# Patient Record
Sex: Female | Born: 1955 | Race: Black or African American | Hispanic: No | Marital: Single | Smoking: Former smoker
Health system: Southern US, Community
[De-identification: ages and names within clinical notes are randomized; demographics above are authoritative.]

## PROBLEM LIST (undated history)

## (undated) DIAGNOSIS — J930 Spontaneous tension pneumothorax: Secondary | ICD-10-CM

## (undated) DIAGNOSIS — I319 Disease of pericardium, unspecified: Secondary | ICD-10-CM

## (undated) DIAGNOSIS — E119 Type 2 diabetes mellitus without complications: Secondary | ICD-10-CM

## (undated) DIAGNOSIS — D649 Anemia, unspecified: Secondary | ICD-10-CM

## (undated) DIAGNOSIS — IMO0001 Reserved for inherently not codable concepts without codable children: Secondary | ICD-10-CM

## (undated) DIAGNOSIS — M1990 Unspecified osteoarthritis, unspecified site: Secondary | ICD-10-CM

## (undated) DIAGNOSIS — Z9889 Other specified postprocedural states: Secondary | ICD-10-CM

## (undated) DIAGNOSIS — R030 Elevated blood-pressure reading, without diagnosis of hypertension: Secondary | ICD-10-CM

## (undated) DIAGNOSIS — I351 Nonrheumatic aortic (valve) insufficiency: Secondary | ICD-10-CM

## (undated) DIAGNOSIS — R112 Nausea with vomiting, unspecified: Secondary | ICD-10-CM

## (undated) DIAGNOSIS — K5792 Diverticulitis of intestine, part unspecified, without perforation or abscess without bleeding: Secondary | ICD-10-CM

## (undated) DIAGNOSIS — I4891 Unspecified atrial fibrillation: Secondary | ICD-10-CM

## (undated) DIAGNOSIS — I3139 Other pericardial effusion (noninflammatory): Secondary | ICD-10-CM

## (undated) DIAGNOSIS — C3400 Malignant neoplasm of unspecified main bronchus: Secondary | ICD-10-CM

## (undated) DIAGNOSIS — J8410 Pulmonary fibrosis, unspecified: Secondary | ICD-10-CM

## (undated) DIAGNOSIS — Z87442 Personal history of urinary calculi: Secondary | ICD-10-CM

## (undated) DIAGNOSIS — L52 Erythema nodosum: Secondary | ICD-10-CM

## (undated) DIAGNOSIS — J984 Other disorders of lung: Secondary | ICD-10-CM

## (undated) DIAGNOSIS — E669 Obesity, unspecified: Secondary | ICD-10-CM

## (undated) DIAGNOSIS — C801 Malignant (primary) neoplasm, unspecified: Secondary | ICD-10-CM

## (undated) DIAGNOSIS — I10 Essential (primary) hypertension: Secondary | ICD-10-CM

## (undated) DIAGNOSIS — I2510 Atherosclerotic heart disease of native coronary artery without angina pectoris: Secondary | ICD-10-CM

## (undated) DIAGNOSIS — I313 Pericardial effusion (noninflammatory): Secondary | ICD-10-CM

## (undated) DIAGNOSIS — R011 Cardiac murmur, unspecified: Secondary | ICD-10-CM

## (undated) DIAGNOSIS — G4700 Insomnia, unspecified: Secondary | ICD-10-CM

## (undated) DIAGNOSIS — T4145XA Adverse effect of unspecified anesthetic, initial encounter: Secondary | ICD-10-CM

## (undated) DIAGNOSIS — T8859XA Other complications of anesthesia, initial encounter: Secondary | ICD-10-CM

## (undated) DIAGNOSIS — T7840XA Allergy, unspecified, initial encounter: Secondary | ICD-10-CM

## (undated) DIAGNOSIS — N200 Calculus of kidney: Secondary | ICD-10-CM

## (undated) DIAGNOSIS — M791 Myalgia, unspecified site: Secondary | ICD-10-CM

## (undated) HISTORY — DX: Disease of pericardium, unspecified: I31.9

## (undated) HISTORY — DX: Insomnia, unspecified: G47.00

## (undated) HISTORY — DX: Cardiac murmur, unspecified: R01.1

## (undated) HISTORY — PX: APPENDECTOMY: SHX54

## (undated) HISTORY — PX: CORONARY ANGIOPLASTY: SHX604

## (undated) HISTORY — PX: SHOULDER ARTHROSCOPY W/ ROTATOR CUFF REPAIR: SHX2400

## (undated) HISTORY — PX: COLONOSCOPY: SHX174

## (undated) HISTORY — DX: Other pericardial effusion (noninflammatory): I31.39

## (undated) HISTORY — DX: Spontaneous tension pneumothorax: J93.0

## (undated) HISTORY — DX: Myalgia, unspecified site: M79.10

## (undated) HISTORY — DX: Erythema nodosum: L52

## (undated) HISTORY — PX: SHOULDER ADHESION RELEASE: SHX773

## (undated) HISTORY — DX: Obesity, unspecified: E66.9

## (undated) HISTORY — DX: Nonrheumatic aortic (valve) insufficiency: I35.1

## (undated) HISTORY — DX: Unspecified atrial fibrillation: I48.91

## (undated) HISTORY — DX: Reserved for inherently not codable concepts without codable children: IMO0001

## (undated) HISTORY — PX: INNER EAR SURGERY: SHX679

## (undated) HISTORY — DX: Calculus of kidney: N20.0

## (undated) HISTORY — PX: KNEE ARTHROSCOPY: SHX127

## (undated) HISTORY — DX: Elevated blood-pressure reading, without diagnosis of hypertension: R03.0

## (undated) HISTORY — DX: Type 2 diabetes mellitus without complications: E11.9

## (undated) HISTORY — DX: Diverticulitis of intestine, part unspecified, without perforation or abscess without bleeding: K57.92

## (undated) HISTORY — PX: CARPAL TUNNEL RELEASE: SHX101

## (undated) HISTORY — DX: Anemia, unspecified: D64.9

## (undated) HISTORY — DX: Malignant neoplasm of unspecified main bronchus: C34.00

## (undated) HISTORY — DX: Pulmonary fibrosis, unspecified: J84.10

## (undated) HISTORY — DX: Allergy, unspecified, initial encounter: T78.40XA

## (undated) HISTORY — DX: Pericardial effusion (noninflammatory): I31.3

---

## 1979-06-22 HISTORY — PX: FOOT SURGERY: SHX648

## 1989-10-21 HISTORY — PX: KNEE ARTHROPLASTY: SHX992

## 1999-08-01 ENCOUNTER — Other Ambulatory Visit: Payer: Self-pay | Admitting: Pulmonary Disease

## 1999-09-12 ENCOUNTER — Ambulatory Visit (HOSPITAL_COMMUNITY): Admission: RE | Admit: 1999-09-12 | Discharge: 1999-09-12 | Payer: Self-pay | Admitting: *Deleted

## 1999-09-12 ENCOUNTER — Encounter: Payer: Self-pay | Admitting: *Deleted

## 1999-10-22 HISTORY — PX: LUNG CANCER SURGERY: SHX702

## 1999-10-24 ENCOUNTER — Encounter: Payer: Self-pay | Admitting: Thoracic Surgery

## 1999-10-25 ENCOUNTER — Encounter (INDEPENDENT_AMBULATORY_CARE_PROVIDER_SITE_OTHER): Payer: Self-pay | Admitting: *Deleted

## 1999-10-25 ENCOUNTER — Inpatient Hospital Stay (HOSPITAL_COMMUNITY): Admission: RE | Admit: 1999-10-25 | Discharge: 1999-11-01 | Payer: Self-pay | Admitting: Thoracic Surgery

## 1999-10-25 ENCOUNTER — Encounter: Payer: Self-pay | Admitting: Thoracic Surgery

## 1999-10-26 ENCOUNTER — Encounter: Payer: Self-pay | Admitting: Thoracic Surgery

## 1999-10-27 ENCOUNTER — Encounter: Payer: Self-pay | Admitting: Thoracic Surgery

## 1999-10-28 ENCOUNTER — Encounter: Payer: Self-pay | Admitting: Thoracic Surgery

## 1999-10-29 ENCOUNTER — Encounter: Payer: Self-pay | Admitting: Thoracic Surgery

## 1999-10-31 ENCOUNTER — Encounter: Payer: Self-pay | Admitting: Thoracic Surgery

## 1999-11-08 ENCOUNTER — Encounter: Payer: Self-pay | Admitting: Thoracic Surgery

## 1999-11-08 ENCOUNTER — Encounter: Admission: RE | Admit: 1999-11-08 | Discharge: 1999-11-08 | Payer: Self-pay | Admitting: Thoracic Surgery

## 1999-11-26 ENCOUNTER — Encounter: Admission: RE | Admit: 1999-11-26 | Discharge: 2000-02-24 | Payer: Self-pay | Admitting: Radiation Oncology

## 2000-01-08 ENCOUNTER — Encounter: Admission: RE | Admit: 2000-01-08 | Discharge: 2000-01-08 | Payer: Self-pay | Admitting: Oncology

## 2000-01-08 ENCOUNTER — Encounter: Payer: Self-pay | Admitting: Oncology

## 2000-01-30 ENCOUNTER — Encounter: Payer: Self-pay | Admitting: Thoracic Surgery

## 2000-01-30 ENCOUNTER — Encounter: Admission: RE | Admit: 2000-01-30 | Discharge: 2000-01-30 | Payer: Self-pay | Admitting: Thoracic Surgery

## 2000-05-07 ENCOUNTER — Encounter: Payer: Self-pay | Admitting: Thoracic Surgery

## 2000-05-07 ENCOUNTER — Encounter: Admission: RE | Admit: 2000-05-07 | Discharge: 2000-05-07 | Payer: Self-pay | Admitting: Thoracic Surgery

## 2000-06-02 ENCOUNTER — Encounter: Payer: Self-pay | Admitting: Oncology

## 2000-06-02 ENCOUNTER — Encounter: Admission: RE | Admit: 2000-06-02 | Discharge: 2000-06-02 | Payer: Self-pay | Admitting: Oncology

## 2000-09-02 ENCOUNTER — Encounter: Admission: RE | Admit: 2000-09-02 | Discharge: 2000-09-02 | Payer: Self-pay | Admitting: Oncology

## 2000-09-02 ENCOUNTER — Encounter: Payer: Self-pay | Admitting: Oncology

## 2000-09-24 ENCOUNTER — Encounter: Payer: Self-pay | Admitting: Family Medicine

## 2000-09-24 ENCOUNTER — Ambulatory Visit (HOSPITAL_COMMUNITY): Admission: RE | Admit: 2000-09-24 | Discharge: 2000-09-24 | Payer: Self-pay | Admitting: Family Medicine

## 2000-10-21 LAB — HM PAP SMEAR

## 2000-10-21 LAB — HM MAMMOGRAPHY

## 2000-10-23 ENCOUNTER — Encounter: Payer: Self-pay | Admitting: Thoracic Surgery

## 2000-10-23 ENCOUNTER — Encounter: Admission: RE | Admit: 2000-10-23 | Discharge: 2000-10-23 | Payer: Self-pay | Admitting: Thoracic Surgery

## 2000-11-17 ENCOUNTER — Emergency Department (HOSPITAL_COMMUNITY): Admission: EM | Admit: 2000-11-17 | Discharge: 2000-11-17 | Payer: Self-pay | Admitting: Emergency Medicine

## 2000-11-17 ENCOUNTER — Encounter: Payer: Self-pay | Admitting: Emergency Medicine

## 2001-01-06 ENCOUNTER — Encounter: Payer: Self-pay | Admitting: *Deleted

## 2001-01-06 ENCOUNTER — Ambulatory Visit (HOSPITAL_COMMUNITY): Admission: RE | Admit: 2001-01-06 | Discharge: 2001-01-06 | Payer: Self-pay | Admitting: *Deleted

## 2001-01-06 ENCOUNTER — Encounter (INDEPENDENT_AMBULATORY_CARE_PROVIDER_SITE_OTHER): Payer: Self-pay

## 2001-01-21 ENCOUNTER — Encounter: Admission: RE | Admit: 2001-01-21 | Discharge: 2001-01-21 | Payer: Self-pay | Admitting: Thoracic Surgery

## 2001-01-21 ENCOUNTER — Encounter: Payer: Self-pay | Admitting: Thoracic Surgery

## 2001-03-02 ENCOUNTER — Ambulatory Visit (HOSPITAL_COMMUNITY): Admission: RE | Admit: 2001-03-02 | Discharge: 2001-03-02 | Payer: Self-pay | Admitting: Oncology

## 2001-03-02 ENCOUNTER — Encounter: Payer: Self-pay | Admitting: Oncology

## 2001-05-20 ENCOUNTER — Encounter: Admission: RE | Admit: 2001-05-20 | Discharge: 2001-05-20 | Payer: Self-pay | Admitting: Thoracic Surgery

## 2001-05-20 ENCOUNTER — Encounter: Payer: Self-pay | Admitting: Thoracic Surgery

## 2001-05-25 ENCOUNTER — Encounter: Payer: Self-pay | Admitting: Oncology

## 2001-05-25 ENCOUNTER — Encounter: Admission: RE | Admit: 2001-05-25 | Discharge: 2001-05-25 | Payer: Self-pay | Admitting: Oncology

## 2001-08-19 ENCOUNTER — Encounter: Payer: Self-pay | Admitting: Thoracic Surgery

## 2001-08-19 ENCOUNTER — Encounter: Admission: RE | Admit: 2001-08-19 | Discharge: 2001-08-19 | Payer: Self-pay | Admitting: Thoracic Surgery

## 2001-08-27 ENCOUNTER — Ambulatory Visit (HOSPITAL_COMMUNITY): Admission: RE | Admit: 2001-08-27 | Discharge: 2001-08-27 | Payer: Self-pay | Admitting: *Deleted

## 2001-08-27 ENCOUNTER — Encounter: Payer: Self-pay | Admitting: *Deleted

## 2001-09-28 ENCOUNTER — Encounter: Admission: RE | Admit: 2001-09-28 | Discharge: 2001-09-28 | Payer: Self-pay | Admitting: Oncology

## 2001-09-28 ENCOUNTER — Encounter: Payer: Self-pay | Admitting: Oncology

## 2001-10-12 ENCOUNTER — Ambulatory Visit (HOSPITAL_COMMUNITY): Admission: RE | Admit: 2001-10-12 | Discharge: 2001-10-12 | Payer: Self-pay | Admitting: *Deleted

## 2001-10-12 ENCOUNTER — Encounter: Payer: Self-pay | Admitting: *Deleted

## 2001-11-04 ENCOUNTER — Encounter: Payer: Self-pay | Admitting: Thoracic Surgery

## 2001-11-04 ENCOUNTER — Encounter: Admission: RE | Admit: 2001-11-04 | Discharge: 2001-11-04 | Payer: Self-pay | Admitting: Thoracic Surgery

## 2002-01-27 ENCOUNTER — Encounter: Admission: RE | Admit: 2002-01-27 | Discharge: 2002-01-27 | Payer: Self-pay | Admitting: Oncology

## 2002-01-27 ENCOUNTER — Encounter: Payer: Self-pay | Admitting: Oncology

## 2002-02-01 ENCOUNTER — Ambulatory Visit (HOSPITAL_COMMUNITY): Admission: RE | Admit: 2002-02-01 | Discharge: 2002-02-01 | Payer: Self-pay | Admitting: Oncology

## 2002-02-01 ENCOUNTER — Encounter: Payer: Self-pay | Admitting: Oncology

## 2002-05-12 ENCOUNTER — Encounter: Admission: RE | Admit: 2002-05-12 | Discharge: 2002-05-12 | Payer: Self-pay | Admitting: Thoracic Surgery

## 2002-05-12 ENCOUNTER — Encounter: Payer: Self-pay | Admitting: Thoracic Surgery

## 2002-05-17 ENCOUNTER — Encounter: Payer: Self-pay | Admitting: Oncology

## 2002-05-17 ENCOUNTER — Ambulatory Visit (HOSPITAL_COMMUNITY): Admission: RE | Admit: 2002-05-17 | Discharge: 2002-05-17 | Payer: Self-pay | Admitting: Oncology

## 2002-06-01 ENCOUNTER — Encounter: Payer: Self-pay | Admitting: Oncology

## 2002-06-01 ENCOUNTER — Encounter: Admission: RE | Admit: 2002-06-01 | Discharge: 2002-06-01 | Payer: Self-pay | Admitting: Oncology

## 2002-09-27 ENCOUNTER — Encounter: Payer: Self-pay | Admitting: Oncology

## 2002-09-27 ENCOUNTER — Ambulatory Visit (HOSPITAL_COMMUNITY): Admission: RE | Admit: 2002-09-27 | Discharge: 2002-09-27 | Payer: Self-pay | Admitting: Oncology

## 2002-10-08 ENCOUNTER — Ambulatory Visit (HOSPITAL_BASED_OUTPATIENT_CLINIC_OR_DEPARTMENT_OTHER): Admission: RE | Admit: 2002-10-08 | Discharge: 2002-10-08 | Payer: Self-pay | Admitting: Orthopedic Surgery

## 2002-11-04 ENCOUNTER — Encounter: Admission: RE | Admit: 2002-11-04 | Discharge: 2003-02-02 | Payer: Self-pay

## 2002-12-13 ENCOUNTER — Other Ambulatory Visit: Admission: RE | Admit: 2002-12-13 | Discharge: 2002-12-13 | Payer: Self-pay | Admitting: Endocrinology

## 2002-12-15 ENCOUNTER — Ambulatory Visit (HOSPITAL_BASED_OUTPATIENT_CLINIC_OR_DEPARTMENT_OTHER): Admission: RE | Admit: 2002-12-15 | Discharge: 2002-12-16 | Payer: Self-pay | Admitting: Orthopedic Surgery

## 2002-12-31 ENCOUNTER — Encounter: Payer: Self-pay | Admitting: Cardiology

## 2003-01-24 ENCOUNTER — Encounter: Payer: Self-pay | Admitting: Oncology

## 2003-01-24 ENCOUNTER — Ambulatory Visit (HOSPITAL_COMMUNITY): Admission: RE | Admit: 2003-01-24 | Discharge: 2003-01-24 | Payer: Self-pay | Admitting: Oncology

## 2003-02-01 ENCOUNTER — Encounter: Admission: RE | Admit: 2003-02-01 | Discharge: 2003-05-02 | Payer: Self-pay

## 2003-05-26 ENCOUNTER — Ambulatory Visit (HOSPITAL_COMMUNITY): Admission: RE | Admit: 2003-05-26 | Discharge: 2003-05-26 | Payer: Self-pay | Admitting: Oncology

## 2003-05-26 ENCOUNTER — Encounter: Payer: Self-pay | Admitting: Oncology

## 2003-06-21 ENCOUNTER — Encounter: Payer: Self-pay | Admitting: Oncology

## 2003-06-21 ENCOUNTER — Encounter: Admission: RE | Admit: 2003-06-21 | Discharge: 2003-06-21 | Payer: Self-pay | Admitting: Oncology

## 2003-07-20 ENCOUNTER — Ambulatory Visit (HOSPITAL_BASED_OUTPATIENT_CLINIC_OR_DEPARTMENT_OTHER): Admission: RE | Admit: 2003-07-20 | Discharge: 2003-07-20 | Payer: Self-pay | Admitting: Orthopedic Surgery

## 2003-07-20 ENCOUNTER — Encounter (INDEPENDENT_AMBULATORY_CARE_PROVIDER_SITE_OTHER): Payer: Self-pay | Admitting: Specialist

## 2003-07-20 ENCOUNTER — Ambulatory Visit (HOSPITAL_COMMUNITY): Admission: RE | Admit: 2003-07-20 | Discharge: 2003-07-20 | Payer: Self-pay | Admitting: Orthopedic Surgery

## 2003-09-19 ENCOUNTER — Other Ambulatory Visit: Admission: RE | Admit: 2003-09-19 | Discharge: 2003-09-19 | Payer: Self-pay | Admitting: Obstetrics & Gynecology

## 2003-10-26 ENCOUNTER — Ambulatory Visit (HOSPITAL_COMMUNITY): Admission: RE | Admit: 2003-10-26 | Discharge: 2003-10-26 | Payer: Self-pay | Admitting: Oncology

## 2004-01-24 ENCOUNTER — Encounter: Admission: RE | Admit: 2004-01-24 | Discharge: 2004-04-23 | Payer: Self-pay | Admitting: Occupational Medicine

## 2004-03-17 ENCOUNTER — Inpatient Hospital Stay (HOSPITAL_COMMUNITY): Admission: EM | Admit: 2004-03-17 | Discharge: 2004-03-19 | Payer: Self-pay | Admitting: Emergency Medicine

## 2004-04-24 ENCOUNTER — Ambulatory Visit (HOSPITAL_COMMUNITY): Admission: RE | Admit: 2004-04-24 | Discharge: 2004-04-24 | Payer: Self-pay | Admitting: Oncology

## 2004-06-21 ENCOUNTER — Encounter: Admission: RE | Admit: 2004-06-21 | Discharge: 2004-06-21 | Payer: Self-pay | Admitting: Oncology

## 2004-07-16 ENCOUNTER — Ambulatory Visit (HOSPITAL_BASED_OUTPATIENT_CLINIC_OR_DEPARTMENT_OTHER): Admission: RE | Admit: 2004-07-16 | Discharge: 2004-07-16 | Payer: Self-pay | Admitting: Orthopedic Surgery

## 2004-07-16 ENCOUNTER — Ambulatory Visit (HOSPITAL_COMMUNITY): Admission: RE | Admit: 2004-07-16 | Discharge: 2004-07-16 | Payer: Self-pay | Admitting: Orthopedic Surgery

## 2004-10-10 ENCOUNTER — Ambulatory Visit: Payer: Self-pay | Admitting: Endocrinology

## 2004-10-23 ENCOUNTER — Ambulatory Visit (HOSPITAL_COMMUNITY): Admission: RE | Admit: 2004-10-23 | Discharge: 2004-10-23 | Payer: Self-pay | Admitting: Oncology

## 2004-10-23 ENCOUNTER — Ambulatory Visit: Payer: Self-pay | Admitting: Oncology

## 2004-10-26 ENCOUNTER — Encounter: Admission: RE | Admit: 2004-10-26 | Discharge: 2004-10-26 | Payer: Self-pay | Admitting: Occupational Medicine

## 2005-02-11 ENCOUNTER — Other Ambulatory Visit: Admission: RE | Admit: 2005-02-11 | Discharge: 2005-02-11 | Payer: Self-pay | Admitting: *Deleted

## 2005-03-04 ENCOUNTER — Emergency Department (HOSPITAL_COMMUNITY): Admission: EM | Admit: 2005-03-04 | Discharge: 2005-03-04 | Payer: Self-pay | Admitting: Emergency Medicine

## 2005-04-02 ENCOUNTER — Ambulatory Visit (HOSPITAL_BASED_OUTPATIENT_CLINIC_OR_DEPARTMENT_OTHER): Admission: RE | Admit: 2005-04-02 | Discharge: 2005-04-02 | Payer: Self-pay | Admitting: Urology

## 2005-04-02 ENCOUNTER — Ambulatory Visit (HOSPITAL_COMMUNITY): Admission: RE | Admit: 2005-04-02 | Discharge: 2005-04-02 | Payer: Self-pay | Admitting: Urology

## 2005-04-29 ENCOUNTER — Ambulatory Visit: Payer: Self-pay | Admitting: Oncology

## 2005-05-02 ENCOUNTER — Ambulatory Visit (HOSPITAL_COMMUNITY): Admission: RE | Admit: 2005-05-02 | Discharge: 2005-05-02 | Payer: Self-pay | Admitting: Oncology

## 2005-07-30 ENCOUNTER — Encounter: Admission: RE | Admit: 2005-07-30 | Discharge: 2005-07-30 | Payer: Self-pay | Admitting: Oncology

## 2005-10-04 ENCOUNTER — Encounter: Admission: RE | Admit: 2005-10-04 | Discharge: 2005-10-04 | Payer: Self-pay | Admitting: Otolaryngology

## 2005-10-21 DIAGNOSIS — J9383 Other pneumothorax: Secondary | ICD-10-CM | POA: Insufficient documentation

## 2005-10-21 HISTORY — PX: LUNG SURGERY: SHX703

## 2005-10-21 HISTORY — PX: PULMONARY EMBOLISM SURGERY: SHX752

## 2005-11-07 ENCOUNTER — Ambulatory Visit: Payer: Self-pay | Admitting: Oncology

## 2005-11-08 ENCOUNTER — Ambulatory Visit (HOSPITAL_COMMUNITY): Admission: RE | Admit: 2005-11-08 | Discharge: 2005-11-08 | Payer: Self-pay | Admitting: Oncology

## 2005-12-02 ENCOUNTER — Ambulatory Visit (HOSPITAL_COMMUNITY): Admission: RE | Admit: 2005-12-02 | Discharge: 2005-12-02 | Payer: Self-pay | Admitting: *Deleted

## 2006-02-11 ENCOUNTER — Other Ambulatory Visit: Admission: RE | Admit: 2006-02-11 | Discharge: 2006-02-11 | Payer: Self-pay | Admitting: *Deleted

## 2006-05-06 ENCOUNTER — Ambulatory Visit: Payer: Self-pay | Admitting: Oncology

## 2006-05-09 ENCOUNTER — Ambulatory Visit (HOSPITAL_COMMUNITY): Admission: RE | Admit: 2006-05-09 | Discharge: 2006-05-09 | Payer: Self-pay | Admitting: Oncology

## 2006-05-09 LAB — CBC WITH DIFFERENTIAL/PLATELET
BASO%: 0.6 % (ref 0.0–2.0)
Basophils Absolute: 0 10*3/uL (ref 0.0–0.1)
EOS%: 3.5 % (ref 0.0–7.0)
Eosinophils Absolute: 0.2 10*3/uL (ref 0.0–0.5)
HCT: 36.9 % (ref 34.8–46.6)
HGB: 12.4 g/dL (ref 11.6–15.9)
LYMPH%: 24.9 % (ref 14.0–48.0)
MCH: 31.1 pg (ref 26.0–34.0)
MCHC: 33.5 g/dL (ref 32.0–36.0)
MCV: 92.7 fL (ref 81.0–101.0)
MONO#: 0.4 10*3/uL (ref 0.1–0.9)
MONO%: 7.5 % (ref 0.0–13.0)
NEUT#: 3.6 10*3/uL (ref 1.5–6.5)
NEUT%: 63.5 % (ref 39.6–76.8)
Platelets: 342 10*3/uL (ref 145–400)
RBC: 3.98 10*6/uL (ref 3.70–5.32)
RDW: 13.2 % (ref 11.3–14.5)
WBC: 5.7 10*3/uL (ref 3.9–10.0)
lymph#: 1.4 10*3/uL (ref 0.9–3.3)

## 2006-05-09 LAB — COMPREHENSIVE METABOLIC PANEL
ALT: 12 U/L (ref 0–40)
AST: 12 U/L (ref 0–37)
Albumin: 3.9 g/dL (ref 3.5–5.2)
Alkaline Phosphatase: 93 U/L (ref 39–117)
BUN: 15 mg/dL (ref 6–23)
CO2: 24 mEq/L (ref 19–32)
Calcium: 9.2 mg/dL (ref 8.4–10.5)
Chloride: 105 mEq/L (ref 96–112)
Creatinine, Ser: 0.79 mg/dL (ref 0.40–1.20)
Glucose, Bld: 98 mg/dL (ref 70–99)
Potassium: 4 mEq/L (ref 3.5–5.3)
Sodium: 138 mEq/L (ref 135–145)
Total Bilirubin: 0.4 mg/dL (ref 0.3–1.2)
Total Protein: 6.9 g/dL (ref 6.0–8.3)

## 2006-05-09 LAB — LACTATE DEHYDROGENASE: LDH: 154 U/L (ref 94–250)

## 2006-07-31 ENCOUNTER — Encounter: Admission: RE | Admit: 2006-07-31 | Discharge: 2006-07-31 | Payer: Self-pay | Admitting: Oncology

## 2006-08-11 ENCOUNTER — Inpatient Hospital Stay (HOSPITAL_COMMUNITY): Admission: EM | Admit: 2006-08-11 | Discharge: 2006-08-24 | Payer: Self-pay | Admitting: Emergency Medicine

## 2006-08-18 ENCOUNTER — Encounter (INDEPENDENT_AMBULATORY_CARE_PROVIDER_SITE_OTHER): Payer: Self-pay | Admitting: Specialist

## 2006-08-18 ENCOUNTER — Encounter: Payer: Self-pay | Admitting: Thoracic Surgery (Cardiothoracic Vascular Surgery)

## 2006-09-04 ENCOUNTER — Encounter
Admission: RE | Admit: 2006-09-04 | Discharge: 2006-09-04 | Payer: Self-pay | Admitting: Thoracic Surgery (Cardiothoracic Vascular Surgery)

## 2006-10-21 HISTORY — PX: ABDOMINAL HYSTERECTOMY: SHX81

## 2006-11-20 ENCOUNTER — Ambulatory Visit: Payer: Self-pay | Admitting: Oncology

## 2006-11-26 ENCOUNTER — Ambulatory Visit (HOSPITAL_COMMUNITY): Admission: RE | Admit: 2006-11-26 | Discharge: 2006-11-26 | Payer: Self-pay | Admitting: Oncology

## 2006-11-26 LAB — CBC WITH DIFFERENTIAL/PLATELET
BASO%: 1.3 % (ref 0.0–2.0)
Basophils Absolute: 0.1 10*3/uL (ref 0.0–0.1)
EOS%: 7 % (ref 0.0–7.0)
Eosinophils Absolute: 0.3 10*3/uL (ref 0.0–0.5)
HCT: 35.2 % (ref 34.8–46.6)
HGB: 11.8 g/dL (ref 11.6–15.9)
LYMPH%: 26.2 % (ref 14.0–48.0)
MCH: 30.7 pg (ref 26.0–34.0)
MCHC: 33.4 g/dL (ref 32.0–36.0)
MCV: 91.8 fL (ref 81.0–101.0)
MONO#: 0.3 10*3/uL (ref 0.1–0.9)
MONO%: 7.5 % (ref 0.0–13.0)
NEUT#: 2.6 10*3/uL (ref 1.5–6.5)
NEUT%: 58.1 % (ref 39.6–76.8)
Platelets: 285 10*3/uL (ref 145–400)
RBC: 3.83 10*6/uL (ref 3.70–5.32)
RDW: 11.2 % — ABNORMAL LOW (ref 11.3–14.5)
WBC: 4.5 10*3/uL (ref 3.9–10.0)
lymph#: 1.2 10*3/uL (ref 0.9–3.3)

## 2006-11-26 LAB — COMPREHENSIVE METABOLIC PANEL
ALT: 14 U/L (ref 0–35)
AST: 14 U/L (ref 0–37)
Albumin: 4 g/dL (ref 3.5–5.2)
Alkaline Phosphatase: 111 U/L (ref 39–117)
BUN: 16 mg/dL (ref 6–23)
CO2: 19 mEq/L (ref 19–32)
Calcium: 8.8 mg/dL (ref 8.4–10.5)
Chloride: 105 mEq/L (ref 96–112)
Creatinine, Ser: 0.7 mg/dL (ref 0.40–1.20)
Glucose, Bld: 139 mg/dL — ABNORMAL HIGH (ref 70–99)
Potassium: 3.8 mEq/L (ref 3.5–5.3)
Sodium: 141 mEq/L (ref 135–145)
Total Bilirubin: 0.3 mg/dL (ref 0.3–1.2)
Total Protein: 7 g/dL (ref 6.0–8.3)

## 2006-11-26 LAB — LACTATE DEHYDROGENASE: LDH: 211 U/L (ref 94–250)

## 2006-12-11 ENCOUNTER — Encounter: Payer: Self-pay | Admitting: Pulmonary Disease

## 2006-12-11 ENCOUNTER — Ambulatory Visit: Admission: RE | Admit: 2006-12-11 | Discharge: 2006-12-11 | Payer: Self-pay | Admitting: Oncology

## 2007-01-14 ENCOUNTER — Ambulatory Visit: Payer: Self-pay | Admitting: Oncology

## 2007-02-03 ENCOUNTER — Other Ambulatory Visit: Admission: RE | Admit: 2007-02-03 | Discharge: 2007-02-03 | Payer: Self-pay | Admitting: *Deleted

## 2007-05-22 ENCOUNTER — Ambulatory Visit: Payer: Self-pay | Admitting: Oncology

## 2007-05-26 ENCOUNTER — Ambulatory Visit (HOSPITAL_COMMUNITY): Admission: RE | Admit: 2007-05-26 | Discharge: 2007-05-26 | Payer: Self-pay | Admitting: Oncology

## 2007-05-26 LAB — COMPREHENSIVE METABOLIC PANEL
ALT: 15 U/L (ref 0–35)
AST: 19 U/L (ref 0–37)
Albumin: 3.4 g/dL — ABNORMAL LOW (ref 3.5–5.2)
Alkaline Phosphatase: 95 U/L (ref 39–117)
BUN: 16 mg/dL (ref 6–23)
CO2: 29 mEq/L (ref 19–32)
Calcium: 9.1 mg/dL (ref 8.4–10.5)
Chloride: 109 mEq/L (ref 96–112)
Creatinine, Ser: <0.3 mg/dL — ABNORMAL LOW (ref 0.40–1.20)
Glucose, Bld: 160 mg/dL — ABNORMAL HIGH (ref 70–99)
Potassium: 4.3 mEq/L (ref 3.5–5.3)
Sodium: 142 mEq/L (ref 135–145)
Total Bilirubin: 0.4 mg/dL (ref 0.3–1.2)
Total Protein: 6.9 g/dL (ref 6.0–8.3)

## 2007-05-26 LAB — CBC WITH DIFFERENTIAL/PLATELET
BASO%: 0.4 % (ref 0.0–2.0)
Basophils Absolute: 0 10*3/uL (ref 0.0–0.1)
EOS%: 5.2 % (ref 0.0–7.0)
Eosinophils Absolute: 0.3 10*3/uL (ref 0.0–0.5)
HCT: 33.8 % — ABNORMAL LOW (ref 34.8–46.6)
HGB: 11.8 g/dL (ref 11.6–15.9)
LYMPH%: 29 % (ref 14.0–48.0)
MCH: 32 pg (ref 26.0–34.0)
MCHC: 35 g/dL (ref 32.0–36.0)
MCV: 91.4 fL (ref 81.0–101.0)
MONO#: 0.5 10*3/uL (ref 0.1–0.9)
MONO%: 8.9 % (ref 0.0–13.0)
NEUT#: 3 10*3/uL (ref 1.5–6.5)
NEUT%: 56.5 % (ref 39.6–76.8)
Platelets: 303 10*3/uL (ref 145–400)
RBC: 3.7 10*6/uL (ref 3.70–5.32)
RDW: 13.1 % (ref 11.3–14.5)
WBC: 5.2 10*3/uL (ref 3.9–10.0)
lymph#: 1.5 10*3/uL (ref 0.9–3.3)

## 2007-05-26 LAB — LACTATE DEHYDROGENASE: LDH: 134 U/L (ref 94–250)

## 2007-06-19 ENCOUNTER — Encounter: Payer: Self-pay | Admitting: Endocrinology

## 2007-06-19 DIAGNOSIS — E119 Type 2 diabetes mellitus without complications: Secondary | ICD-10-CM | POA: Insufficient documentation

## 2007-06-23 ENCOUNTER — Ambulatory Visit: Payer: Self-pay | Admitting: Endocrinology

## 2007-06-25 ENCOUNTER — Ambulatory Visit: Payer: Self-pay | Admitting: Psychiatry

## 2007-08-03 ENCOUNTER — Encounter: Admission: RE | Admit: 2007-08-03 | Discharge: 2007-08-03 | Payer: Self-pay | Admitting: Oncology

## 2007-08-03 ENCOUNTER — Ambulatory Visit: Payer: Self-pay | Admitting: Endocrinology

## 2007-08-03 LAB — CONVERTED CEMR LAB
ALT: 13 units/L (ref 0–35)
AST: 17 units/L (ref 0–37)
Albumin: 3.6 g/dL (ref 3.5–5.2)
Alkaline Phosphatase: 92 units/L (ref 39–117)
BUN: 16 mg/dL (ref 6–23)
Basophils Absolute: 0 10*3/uL (ref 0.0–0.1)
Basophils Relative: 0.6 % (ref 0.0–1.0)
Bilirubin Urine: NEGATIVE
Bilirubin, Direct: 0.1 mg/dL (ref 0.0–0.3)
CO2: 30 meq/L (ref 19–32)
Calcium: 9.3 mg/dL (ref 8.4–10.5)
Chloride: 105 meq/L (ref 96–112)
Cholesterol: 153 mg/dL (ref 0–200)
Creatinine, Ser: 0.8 mg/dL (ref 0.4–1.2)
Creatinine,U: 122.7 mg/dL
Crystals: NEGATIVE
Eosinophils Absolute: 0.2 10*3/uL (ref 0.0–0.6)
Eosinophils Relative: 3.9 % (ref 0.0–5.0)
GFR calc Af Amer: 98 mL/min
GFR calc non Af Amer: 81 mL/min
Glucose, Bld: 98 mg/dL (ref 70–99)
HCT: 34.5 % — ABNORMAL LOW (ref 36.0–46.0)
HDL: 47.7 mg/dL (ref >39.0)
Hemoglobin: 11.7 g/dL — ABNORMAL LOW (ref 12.0–15.0)
Hgb A1c MFr Bld: 7.5 % — ABNORMAL HIGH (ref 4.6–6.0)
Ketones, ur: NEGATIVE mg/dL
LDL Cholesterol: 87 mg/dL (ref 0–99)
Leukocytes, UA: NEGATIVE
Lymphocytes Relative: 30.3 % (ref 12.0–46.0)
MCHC: 34.1 g/dL (ref 30.0–36.0)
MCV: 91.9 fL (ref 78.0–100.0)
Microalb Creat Ratio: 6.5 mg/g (ref 0.0–30.0)
Microalb, Ur: 0.8 mg/dL (ref 0.0–1.9)
Monocytes Absolute: 0.4 10*3/uL (ref 0.2–0.7)
Monocytes Relative: 8.3 % (ref 3.0–11.0)
Mucus, UA: NEGATIVE
Neutro Abs: 3.2 10*3/uL (ref 1.4–7.7)
Neutrophils Relative %: 56.9 % (ref 43.0–77.0)
Nitrite: NEGATIVE
Platelets: 310 10*3/uL (ref 150–400)
Potassium: 4.4 meq/L (ref 3.5–5.1)
RBC: 3.75 M/uL — ABNORMAL LOW (ref 3.87–5.11)
RDW: 12.8 % (ref 11.5–14.6)
Sodium: 141 meq/L (ref 135–145)
Specific Gravity, Urine: 1.015 (ref 1.000–1.03)
TSH: 1.2 microintl units/mL (ref 0.35–5.50)
Total Bilirubin: 0.4 mg/dL (ref 0.3–1.2)
Total CHOL/HDL Ratio: 3.2
Total Protein, Urine: NEGATIVE mg/dL
Total Protein: 7 g/dL (ref 6.0–8.3)
Triglycerides: 92 mg/dL (ref 0–149)
Urine Glucose: NEGATIVE mg/dL
Urobilinogen, UA: 0.2 (ref 0.0–1.0)
VLDL: 18 mg/dL (ref 0–40)
WBC: 5.4 10*3/uL (ref 4.5–10.5)
pH: 6.5 (ref 5.0–8.0)

## 2007-08-10 ENCOUNTER — Telehealth: Payer: Self-pay | Admitting: Endocrinology

## 2007-08-12 ENCOUNTER — Telehealth: Payer: Self-pay | Admitting: Endocrinology

## 2007-08-14 ENCOUNTER — Ambulatory Visit: Payer: Self-pay | Admitting: Endocrinology

## 2007-08-14 ENCOUNTER — Telehealth: Payer: Self-pay | Admitting: Endocrinology

## 2007-08-14 LAB — CONVERTED CEMR LAB
Bilirubin Urine: NEGATIVE
Crystals: NEGATIVE
Hemoglobin, Urine: NEGATIVE
Ketones, ur: NEGATIVE mg/dL
Leukocytes, UA: NEGATIVE
Mucus, UA: NEGATIVE
Nitrite: NEGATIVE
Specific Gravity, Urine: 1.02 (ref 1.000–1.03)
Total Protein, Urine: NEGATIVE mg/dL
Urine Glucose: NEGATIVE mg/dL
Urobilinogen, UA: 0.2 (ref 0.0–1.0)
pH: 6 (ref 5.0–8.0)

## 2007-09-25 ENCOUNTER — Emergency Department (HOSPITAL_COMMUNITY): Admission: EM | Admit: 2007-09-25 | Discharge: 2007-09-26 | Payer: Self-pay | Admitting: Emergency Medicine

## 2007-10-22 LAB — HM COLONOSCOPY

## 2007-11-16 ENCOUNTER — Ambulatory Visit: Payer: Self-pay | Admitting: Oncology

## 2007-11-18 ENCOUNTER — Encounter: Payer: Self-pay | Admitting: Endocrinology

## 2007-11-18 ENCOUNTER — Ambulatory Visit (HOSPITAL_COMMUNITY): Admission: RE | Admit: 2007-11-18 | Discharge: 2007-11-18 | Payer: Self-pay | Admitting: Oncology

## 2007-11-18 LAB — CBC WITH DIFFERENTIAL/PLATELET
BASO%: 0.8 % (ref 0.0–2.0)
Basophils Absolute: 0 10*3/uL (ref 0.0–0.1)
EOS%: 5.7 % (ref 0.0–7.0)
Eosinophils Absolute: 0.3 10*3/uL (ref 0.0–0.5)
HCT: 33.3 % — ABNORMAL LOW (ref 34.8–46.6)
HGB: 11.2 g/dL — ABNORMAL LOW (ref 11.6–15.9)
LYMPH%: 26 % (ref 14.0–48.0)
MCH: 31.4 pg (ref 26.0–34.0)
MCHC: 33.8 g/dL (ref 32.0–36.0)
MCV: 93 fL (ref 81.0–101.0)
MONO#: 0.4 10*3/uL (ref 0.1–0.9)
MONO%: 8.6 % (ref 0.0–13.0)
NEUT#: 2.7 10*3/uL (ref 1.5–6.5)
NEUT%: 58.9 % (ref 39.6–76.8)
Platelets: 307 10*3/uL (ref 145–400)
RBC: 3.58 10*6/uL — ABNORMAL LOW (ref 3.70–5.32)
RDW: 14.3 % (ref 11.3–14.5)
WBC: 4.5 10*3/uL (ref 3.9–10.0)
lymph#: 1.2 10*3/uL (ref 0.9–3.3)

## 2007-11-18 LAB — COMPREHENSIVE METABOLIC PANEL
ALT: 14 U/L (ref 0–35)
AST: 18 U/L (ref 0–37)
Albumin: 3.5 g/dL (ref 3.5–5.2)
Alkaline Phosphatase: 104 U/L (ref 39–117)
BUN: 14 mg/dL (ref 6–23)
CO2: 29 mEq/L (ref 19–32)
Calcium: 9.1 mg/dL (ref 8.4–10.5)
Chloride: 103 mEq/L (ref 96–112)
Creatinine, Ser: 0.82 mg/dL (ref 0.40–1.20)
Glucose, Bld: 148 mg/dL — ABNORMAL HIGH (ref 70–99)
Potassium: 3.7 mEq/L (ref 3.5–5.3)
Sodium: 139 mEq/L (ref 135–145)
Total Bilirubin: 0.7 mg/dL (ref 0.3–1.2)
Total Protein: 7 g/dL (ref 6.0–8.3)

## 2007-11-18 LAB — LACTATE DEHYDROGENASE: LDH: 162 U/L (ref 94–250)

## 2007-11-23 ENCOUNTER — Encounter: Payer: Self-pay | Admitting: Endocrinology

## 2007-12-14 ENCOUNTER — Ambulatory Visit (HOSPITAL_COMMUNITY): Admission: RE | Admit: 2007-12-14 | Discharge: 2007-12-14 | Payer: Self-pay | Admitting: *Deleted

## 2007-12-22 ENCOUNTER — Ambulatory Visit (HOSPITAL_COMMUNITY): Admission: RE | Admit: 2007-12-22 | Discharge: 2007-12-22 | Payer: Self-pay | Admitting: *Deleted

## 2007-12-31 ENCOUNTER — Ambulatory Visit (HOSPITAL_COMMUNITY): Admission: RE | Admit: 2007-12-31 | Discharge: 2007-12-31 | Payer: Self-pay | Admitting: Gastroenterology

## 2007-12-31 ENCOUNTER — Encounter: Payer: Self-pay | Admitting: Gastroenterology

## 2008-01-07 ENCOUNTER — Ambulatory Visit: Payer: Self-pay | Admitting: Gastroenterology

## 2008-01-15 ENCOUNTER — Encounter: Payer: Self-pay | Admitting: Endocrinology

## 2008-02-18 ENCOUNTER — Encounter (INDEPENDENT_AMBULATORY_CARE_PROVIDER_SITE_OTHER): Payer: Self-pay | Admitting: Gynecology

## 2008-02-18 ENCOUNTER — Inpatient Hospital Stay (HOSPITAL_COMMUNITY): Admission: RE | Admit: 2008-02-18 | Discharge: 2008-02-20 | Payer: Self-pay | Admitting: *Deleted

## 2008-06-05 ENCOUNTER — Ambulatory Visit: Payer: Self-pay | Admitting: Oncology

## 2008-06-07 ENCOUNTER — Telehealth (INDEPENDENT_AMBULATORY_CARE_PROVIDER_SITE_OTHER): Payer: Self-pay | Admitting: *Deleted

## 2008-06-08 ENCOUNTER — Ambulatory Visit (HOSPITAL_COMMUNITY): Admission: RE | Admit: 2008-06-08 | Discharge: 2008-06-08 | Payer: Self-pay | Admitting: Oncology

## 2008-06-08 ENCOUNTER — Encounter: Payer: Self-pay | Admitting: Pulmonary Disease

## 2008-06-08 ENCOUNTER — Encounter: Payer: Self-pay | Admitting: Endocrinology

## 2008-06-08 LAB — LACTATE DEHYDROGENASE: LDH: 162 U/L (ref 94–250)

## 2008-06-08 LAB — CBC WITH DIFFERENTIAL/PLATELET
BASO%: 0.4 % (ref 0.0–2.0)
Basophils Absolute: 0 10*3/uL (ref 0.0–0.1)
EOS%: 6.3 % (ref 0.0–7.0)
Eosinophils Absolute: 0.3 10*3/uL (ref 0.0–0.5)
HCT: 36.3 % (ref 34.8–46.6)
HGB: 12.2 g/dL (ref 11.6–15.9)
LYMPH%: 29.8 % (ref 14.0–48.0)
MCH: 31.3 pg (ref 26.0–34.0)
MCHC: 33.8 g/dL (ref 32.0–36.0)
MCV: 92.8 fL (ref 81.0–101.0)
MONO#: 0.4 10*3/uL (ref 0.1–0.9)
MONO%: 9 % (ref 0.0–13.0)
NEUT#: 2.4 10*3/uL (ref 1.5–6.5)
NEUT%: 54.5 % (ref 39.6–76.8)
Platelets: 274 10*3/uL (ref 145–400)
RBC: 3.91 10*6/uL (ref 3.70–5.32)
RDW: 12.9 % (ref 11.3–14.5)
WBC: 4.4 10*3/uL (ref 3.9–10.0)
lymph#: 1.3 10*3/uL (ref 0.9–3.3)

## 2008-06-08 LAB — COMPREHENSIVE METABOLIC PANEL
ALT: 15 U/L (ref 0–35)
AST: 20 U/L (ref 0–37)
Albumin: 3.4 g/dL — ABNORMAL LOW (ref 3.5–5.2)
Alkaline Phosphatase: 105 U/L (ref 39–117)
BUN: 12 mg/dL (ref 6–23)
CO2: 30 mEq/L (ref 19–32)
Calcium: 9.4 mg/dL (ref 8.4–10.5)
Chloride: 104 mEq/L (ref 96–112)
Creatinine, Ser: 0.77 mg/dL (ref 0.40–1.20)
Glucose, Bld: 155 mg/dL — ABNORMAL HIGH (ref 70–99)
Potassium: 4.2 mEq/L (ref 3.5–5.3)
Sodium: 139 mEq/L (ref 135–145)
Total Bilirubin: 0.7 mg/dL (ref 0.3–1.2)
Total Protein: 6.7 g/dL (ref 6.0–8.3)

## 2008-06-13 ENCOUNTER — Encounter: Payer: Self-pay | Admitting: Endocrinology

## 2008-06-20 ENCOUNTER — Ambulatory Visit: Payer: Self-pay | Admitting: Gastroenterology

## 2008-06-20 DIAGNOSIS — R933 Abnormal findings on diagnostic imaging of other parts of digestive tract: Secondary | ICD-10-CM | POA: Insufficient documentation

## 2008-07-13 ENCOUNTER — Encounter: Payer: Self-pay | Admitting: Pulmonary Disease

## 2008-07-14 ENCOUNTER — Ambulatory Visit: Payer: Self-pay | Admitting: Gastroenterology

## 2008-07-14 ENCOUNTER — Ambulatory Visit (HOSPITAL_COMMUNITY): Admission: RE | Admit: 2008-07-14 | Discharge: 2008-07-14 | Payer: Self-pay | Admitting: Gastroenterology

## 2008-07-17 ENCOUNTER — Encounter: Payer: Self-pay | Admitting: Pulmonary Disease

## 2008-08-03 ENCOUNTER — Encounter: Admission: RE | Admit: 2008-08-03 | Discharge: 2008-08-03 | Payer: Self-pay | Admitting: Oncology

## 2008-08-16 ENCOUNTER — Encounter: Admission: RE | Admit: 2008-08-16 | Discharge: 2008-08-16 | Payer: Self-pay | Admitting: Rheumatology

## 2008-09-08 ENCOUNTER — Ambulatory Visit: Payer: Self-pay | Admitting: Oncology

## 2008-09-12 ENCOUNTER — Encounter: Payer: Self-pay | Admitting: Endocrinology

## 2008-09-12 ENCOUNTER — Encounter: Payer: Self-pay | Admitting: Pulmonary Disease

## 2008-09-20 ENCOUNTER — Ambulatory Visit: Payer: Self-pay | Admitting: Pulmonary Disease

## 2008-09-20 DIAGNOSIS — R0602 Shortness of breath: Secondary | ICD-10-CM | POA: Insufficient documentation

## 2008-09-20 DIAGNOSIS — C3490 Malignant neoplasm of unspecified part of unspecified bronchus or lung: Secondary | ICD-10-CM | POA: Insufficient documentation

## 2008-09-22 ENCOUNTER — Encounter: Admission: RE | Admit: 2008-09-22 | Discharge: 2008-09-22 | Payer: Self-pay | Admitting: *Deleted

## 2008-09-22 ENCOUNTER — Telehealth (INDEPENDENT_AMBULATORY_CARE_PROVIDER_SITE_OTHER): Payer: Self-pay | Admitting: *Deleted

## 2008-12-02 ENCOUNTER — Ambulatory Visit: Payer: Self-pay | Admitting: Oncology

## 2008-12-06 ENCOUNTER — Encounter: Payer: Self-pay | Admitting: Endocrinology

## 2008-12-06 ENCOUNTER — Ambulatory Visit (HOSPITAL_COMMUNITY): Admission: RE | Admit: 2008-12-06 | Discharge: 2008-12-06 | Payer: Self-pay | Admitting: Oncology

## 2008-12-06 LAB — CBC WITH DIFFERENTIAL/PLATELET
BASO%: 0.3 % (ref 0.0–2.0)
Basophils Absolute: 0 10*3/uL (ref 0.0–0.1)
EOS%: 2.1 % (ref 0.0–7.0)
Eosinophils Absolute: 0.2 10*3/uL (ref 0.0–0.5)
HCT: 38.2 % (ref 34.8–46.6)
HGB: 12.9 g/dL (ref 11.6–15.9)
LYMPH%: 27 % (ref 14.0–48.0)
MCH: 31.5 pg (ref 26.0–34.0)
MCHC: 33.9 g/dL (ref 32.0–36.0)
MCV: 93.1 fL (ref 81.0–101.0)
MONO#: 0.4 10*3/uL (ref 0.1–0.9)
MONO%: 5.9 % (ref 0.0–13.0)
NEUT#: 4.7 10*3/uL (ref 1.5–6.5)
NEUT%: 64.7 % (ref 39.6–76.8)
Platelets: 305 10*3/uL (ref 145–400)
RBC: 4.11 10*6/uL (ref 3.70–5.32)
RDW: 12.9 % (ref 11.3–14.5)
WBC: 7.2 10*3/uL (ref 3.9–10.0)
lymph#: 1.9 10*3/uL (ref 0.9–3.3)

## 2008-12-06 LAB — COMPREHENSIVE METABOLIC PANEL
ALT: 17 U/L (ref 0–35)
AST: 17 U/L (ref 0–37)
Albumin: 3.8 g/dL (ref 3.5–5.2)
Alkaline Phosphatase: 106 U/L (ref 39–117)
BUN: 17 mg/dL (ref 6–23)
CO2: 30 mEq/L (ref 19–32)
Calcium: 9.5 mg/dL (ref 8.4–10.5)
Chloride: 102 mEq/L (ref 96–112)
Creatinine, Ser: 0.86 mg/dL (ref 0.40–1.20)
Glucose, Bld: 135 mg/dL — ABNORMAL HIGH (ref 70–99)
Potassium: 4.6 mEq/L (ref 3.5–5.3)
Sodium: 138 mEq/L (ref 135–145)
Total Bilirubin: 0.6 mg/dL (ref 0.3–1.2)
Total Protein: 7.4 g/dL (ref 6.0–8.3)

## 2008-12-06 LAB — LACTATE DEHYDROGENASE: LDH: 126 U/L (ref 94–250)

## 2008-12-12 ENCOUNTER — Telehealth (INDEPENDENT_AMBULATORY_CARE_PROVIDER_SITE_OTHER): Payer: Self-pay | Admitting: *Deleted

## 2008-12-16 ENCOUNTER — Encounter: Payer: Self-pay | Admitting: Endocrinology

## 2008-12-23 ENCOUNTER — Ambulatory Visit (HOSPITAL_BASED_OUTPATIENT_CLINIC_OR_DEPARTMENT_OTHER): Admission: RE | Admit: 2008-12-23 | Discharge: 2008-12-23 | Payer: Self-pay | Admitting: Orthopedic Surgery

## 2009-01-10 ENCOUNTER — Ambulatory Visit: Payer: Self-pay | Admitting: Pulmonary Disease

## 2009-01-12 ENCOUNTER — Encounter: Payer: Self-pay | Admitting: Pulmonary Disease

## 2009-01-12 ENCOUNTER — Ambulatory Visit: Payer: Self-pay

## 2009-01-15 ENCOUNTER — Encounter: Payer: Self-pay | Admitting: Pulmonary Disease

## 2009-01-16 ENCOUNTER — Telehealth: Payer: Self-pay | Admitting: Pulmonary Disease

## 2009-01-27 ENCOUNTER — Telehealth (INDEPENDENT_AMBULATORY_CARE_PROVIDER_SITE_OTHER): Payer: Self-pay | Admitting: *Deleted

## 2009-01-31 ENCOUNTER — Telehealth (INDEPENDENT_AMBULATORY_CARE_PROVIDER_SITE_OTHER): Payer: Self-pay | Admitting: *Deleted

## 2009-02-16 ENCOUNTER — Encounter (HOSPITAL_COMMUNITY): Admission: RE | Admit: 2009-02-16 | Discharge: 2009-04-19 | Payer: Self-pay | Admitting: Pulmonary Disease

## 2009-02-21 ENCOUNTER — Encounter: Payer: Self-pay | Admitting: Cardiovascular Disease

## 2009-02-21 ENCOUNTER — Encounter: Payer: Self-pay | Admitting: Internal Medicine

## 2009-02-24 ENCOUNTER — Ambulatory Visit: Payer: Self-pay | Admitting: Internal Medicine

## 2009-02-24 DIAGNOSIS — R011 Cardiac murmur, unspecified: Secondary | ICD-10-CM | POA: Insufficient documentation

## 2009-04-20 ENCOUNTER — Encounter (HOSPITAL_COMMUNITY): Admission: RE | Admit: 2009-04-20 | Discharge: 2009-05-29 | Payer: Self-pay | Admitting: Pulmonary Disease

## 2009-04-27 ENCOUNTER — Ambulatory Visit: Payer: Self-pay | Admitting: Pulmonary Disease

## 2009-05-17 ENCOUNTER — Encounter: Payer: Self-pay | Admitting: Emergency Medicine

## 2009-05-17 ENCOUNTER — Ambulatory Visit: Payer: Self-pay | Admitting: Vascular Surgery

## 2009-05-17 ENCOUNTER — Ambulatory Visit (HOSPITAL_COMMUNITY): Admission: RE | Admit: 2009-05-17 | Discharge: 2009-05-17 | Payer: Self-pay | Admitting: Emergency Medicine

## 2009-05-23 ENCOUNTER — Encounter: Payer: Self-pay | Admitting: Pulmonary Disease

## 2009-05-24 ENCOUNTER — Encounter: Payer: Self-pay | Admitting: Pulmonary Disease

## 2009-05-30 ENCOUNTER — Encounter (HOSPITAL_COMMUNITY): Admission: RE | Admit: 2009-05-30 | Discharge: 2009-07-20 | Payer: Self-pay | Admitting: Pulmonary Disease

## 2009-06-08 ENCOUNTER — Ambulatory Visit: Payer: Self-pay | Admitting: Pulmonary Disease

## 2009-06-30 ENCOUNTER — Telehealth: Payer: Self-pay | Admitting: Gastroenterology

## 2009-07-03 ENCOUNTER — Encounter: Payer: Self-pay | Admitting: Gastroenterology

## 2009-07-03 DIAGNOSIS — K319 Disease of stomach and duodenum, unspecified: Secondary | ICD-10-CM | POA: Insufficient documentation

## 2009-07-17 ENCOUNTER — Telehealth: Payer: Self-pay | Admitting: Pulmonary Disease

## 2009-07-20 ENCOUNTER — Ambulatory Visit: Payer: Self-pay | Admitting: Gastroenterology

## 2009-07-20 ENCOUNTER — Ambulatory Visit (HOSPITAL_COMMUNITY): Admission: RE | Admit: 2009-07-20 | Discharge: 2009-07-20 | Payer: Self-pay | Admitting: Gastroenterology

## 2009-07-20 ENCOUNTER — Telehealth (INDEPENDENT_AMBULATORY_CARE_PROVIDER_SITE_OTHER): Payer: Self-pay | Admitting: *Deleted

## 2009-07-21 ENCOUNTER — Encounter: Payer: Self-pay | Admitting: Pulmonary Disease

## 2009-07-25 ENCOUNTER — Telehealth (INDEPENDENT_AMBULATORY_CARE_PROVIDER_SITE_OTHER): Payer: Self-pay | Admitting: *Deleted

## 2009-08-01 ENCOUNTER — Telehealth (INDEPENDENT_AMBULATORY_CARE_PROVIDER_SITE_OTHER): Payer: Self-pay | Admitting: *Deleted

## 2009-08-03 ENCOUNTER — Telehealth (INDEPENDENT_AMBULATORY_CARE_PROVIDER_SITE_OTHER): Payer: Self-pay | Admitting: *Deleted

## 2009-08-08 ENCOUNTER — Encounter: Admission: RE | Admit: 2009-08-08 | Discharge: 2009-08-08 | Payer: Self-pay | Admitting: Oncology

## 2009-08-17 ENCOUNTER — Telehealth (INDEPENDENT_AMBULATORY_CARE_PROVIDER_SITE_OTHER): Payer: Self-pay | Admitting: *Deleted

## 2009-08-22 ENCOUNTER — Ambulatory Visit: Payer: Self-pay | Admitting: Gastroenterology

## 2009-10-06 ENCOUNTER — Ambulatory Visit: Payer: Self-pay | Admitting: Internal Medicine

## 2009-10-16 ENCOUNTER — Telehealth: Payer: Self-pay | Admitting: Pulmonary Disease

## 2009-11-03 ENCOUNTER — Ambulatory Visit: Payer: Self-pay | Admitting: Pulmonary Disease

## 2009-12-06 ENCOUNTER — Ambulatory Visit: Payer: Self-pay | Admitting: Oncology

## 2009-12-11 ENCOUNTER — Ambulatory Visit (HOSPITAL_COMMUNITY): Admission: RE | Admit: 2009-12-11 | Discharge: 2009-12-11 | Payer: Self-pay | Admitting: Oncology

## 2009-12-11 LAB — CBC WITH DIFFERENTIAL/PLATELET
BASO%: 0.3 % (ref 0.0–2.0)
Basophils Absolute: 0 10*3/uL (ref 0.0–0.1)
EOS%: 5.7 % (ref 0.0–7.0)
Eosinophils Absolute: 0.3 10*3/uL (ref 0.0–0.5)
HCT: 37 % (ref 34.8–46.6)
HGB: 12.8 g/dL (ref 11.6–15.9)
LYMPH%: 25.3 % (ref 14.0–49.7)
MCH: 32 pg (ref 25.1–34.0)
MCHC: 34.5 g/dL (ref 31.5–36.0)
MCV: 92.8 fL (ref 79.5–101.0)
MONO#: 0.4 10*3/uL (ref 0.1–0.9)
MONO%: 7.9 % (ref 0.0–14.0)
NEUT#: 3.2 10*3/uL (ref 1.5–6.5)
NEUT%: 60.8 % (ref 38.4–76.8)
Platelets: 273 10*3/uL (ref 145–400)
RBC: 3.99 10*6/uL (ref 3.70–5.45)
RDW: 12.8 % (ref 11.2–14.5)
WBC: 5.3 10*3/uL (ref 3.9–10.3)
lymph#: 1.3 10*3/uL (ref 0.9–3.3)

## 2009-12-11 LAB — COMPREHENSIVE METABOLIC PANEL
ALT: 21 U/L (ref 0–35)
AST: 23 U/L (ref 0–37)
Albumin: 3.6 g/dL (ref 3.5–5.2)
Alkaline Phosphatase: 144 U/L — ABNORMAL HIGH (ref 39–117)
BUN: 12 mg/dL (ref 6–23)
CO2: 26 mEq/L (ref 19–32)
Calcium: 9.1 mg/dL (ref 8.4–10.5)
Chloride: 101 mEq/L (ref 96–112)
Creatinine, Ser: 0.83 mg/dL (ref 0.40–1.20)
Glucose, Bld: 370 mg/dL — ABNORMAL HIGH (ref 70–99)
Potassium: 3.9 mEq/L (ref 3.5–5.3)
Sodium: 135 mEq/L (ref 135–145)
Total Bilirubin: 0.8 mg/dL (ref 0.3–1.2)
Total Protein: 7.1 g/dL (ref 6.0–8.3)

## 2009-12-11 LAB — LACTATE DEHYDROGENASE: LDH: 153 U/L (ref 94–250)

## 2009-12-15 ENCOUNTER — Encounter: Payer: Self-pay | Admitting: Pulmonary Disease

## 2010-01-05 ENCOUNTER — Ambulatory Visit: Payer: Self-pay | Admitting: Oncology

## 2010-01-09 LAB — COMPREHENSIVE METABOLIC PANEL
ALT: 19 U/L (ref 0–35)
AST: 17 U/L (ref 0–37)
Albumin: 3.9 g/dL (ref 3.5–5.2)
Alkaline Phosphatase: 99 U/L (ref 39–117)
BUN: 17 mg/dL (ref 6–23)
CO2: 23 mEq/L (ref 19–32)
Calcium: 9.1 mg/dL (ref 8.4–10.5)
Chloride: 107 mEq/L (ref 96–112)
Creatinine, Ser: 0.85 mg/dL (ref 0.40–1.20)
Glucose, Bld: 195 mg/dL — ABNORMAL HIGH (ref 70–99)
Potassium: 4 mEq/L (ref 3.5–5.3)
Sodium: 142 mEq/L (ref 135–145)
Total Bilirubin: 0.4 mg/dL (ref 0.3–1.2)
Total Protein: 6.5 g/dL (ref 6.0–8.3)

## 2010-01-09 LAB — VITAMIN D 25 HYDROXY (VIT D DEFICIENCY, FRACTURES): Vit D, 25-Hydroxy: 11 ng/mL — ABNORMAL LOW (ref 30–89)

## 2010-05-21 ENCOUNTER — Ambulatory Visit (HOSPITAL_COMMUNITY): Admission: RE | Admit: 2010-05-21 | Discharge: 2010-05-21 | Payer: Self-pay | Admitting: Oncology

## 2010-07-25 ENCOUNTER — Emergency Department (HOSPITAL_COMMUNITY): Admission: EM | Admit: 2010-07-25 | Discharge: 2010-07-26 | Payer: Self-pay | Admitting: Emergency Medicine

## 2010-07-29 ENCOUNTER — Inpatient Hospital Stay (HOSPITAL_COMMUNITY): Admission: EM | Admit: 2010-07-29 | Discharge: 2010-07-29 | Payer: Self-pay | Admitting: Emergency Medicine

## 2010-08-09 ENCOUNTER — Encounter: Admission: RE | Admit: 2010-08-09 | Discharge: 2010-08-09 | Payer: Self-pay | Admitting: Oncology

## 2010-08-23 ENCOUNTER — Encounter: Payer: Self-pay | Admitting: Physician Assistant

## 2010-08-23 ENCOUNTER — Encounter: Payer: Self-pay | Admitting: Pulmonary Disease

## 2010-08-30 ENCOUNTER — Encounter: Payer: Self-pay | Admitting: Pulmonary Disease

## 2010-09-04 ENCOUNTER — Encounter: Payer: Self-pay | Admitting: Physician Assistant

## 2010-09-05 ENCOUNTER — Ambulatory Visit: Payer: Self-pay | Admitting: Pulmonary Disease

## 2010-09-05 ENCOUNTER — Ambulatory Visit: Payer: Self-pay | Admitting: Physician Assistant

## 2010-09-27 ENCOUNTER — Emergency Department (HOSPITAL_COMMUNITY): Admission: EM | Admit: 2010-09-27 | Discharge: 2010-08-05 | Payer: Self-pay | Admitting: Emergency Medicine

## 2010-10-21 HISTORY — PX: ESOPHAGOGASTRODUODENOSCOPY (EGD) WITH ESOPHAGEAL DILATION: SHX5812

## 2010-11-09 ENCOUNTER — Other Ambulatory Visit: Payer: Self-pay | Admitting: Oncology

## 2010-11-09 DIAGNOSIS — C3490 Malignant neoplasm of unspecified part of unspecified bronchus or lung: Secondary | ICD-10-CM

## 2010-11-11 ENCOUNTER — Encounter: Payer: Self-pay | Admitting: Gastroenterology

## 2010-11-15 ENCOUNTER — Other Ambulatory Visit (HOSPITAL_COMMUNITY): Payer: Self-pay | Admitting: Oncology

## 2010-11-15 DIAGNOSIS — C50919 Malignant neoplasm of unspecified site of unspecified female breast: Secondary | ICD-10-CM

## 2010-11-22 NOTE — Miscellaneous (Signed)
Summary: Order for maintenance level/[facility]  Order for maintenance level/[facility]   Imported By: Lester  05/26/2009 10:10:58  _____________________________________________________________________  External Attachment:    Type:   Image     Comment:   External Document

## 2010-11-22 NOTE — Letter (Signed)
Summary: Recurrent dysphagia; submucosal lesion stomach/[facility]  Recurrent dysphagia; submucosal lesion stomach/[facility]   Imported By: Lester [place] 10/04/2008 12:18:10  _____________________________________________________________________  External Attachment:    Type:   Image     Comment:   External Document

## 2010-11-22 NOTE — Progress Notes (Signed)
Summary: waiting for letter  Phone Note Call from Patient   Caller: Patient Call For: alva Summary of Call: pt would like to pick up letter re: pulm consult. says suzanne was to call her when this was completed. [phone]  Initial call taken by: Tivis Ringer,  September 22, 2008 10:12 AM  Follow-up for Phone Call        copied letter and called and advised patient that letter is up front to pick up. She said she will be here shortly to pick up. Follow-up by: Clarise Cruz Duncan Dull),  September 22, 2008 3:56 PM

## 2010-11-22 NOTE — Assessment & Plan Note (Signed)
Review of gastrointestinal problems: 1. Routine risk for colon cancer; Feb 2009 colonoscopy with dr. Virginia Rochester, no polyps.  Next colonoscopy should be 11/2017 2. submucosal gastric lesion, first noted early 2009. Endoscopic ultrasound procedures have been performed, none with firm tissue diagnosis, next Examination at 3 year interval given stability, small size of lesion    History of Present Illness Visit Type: Follow-up Visit Primary GI MD: Jennye Moccasin PA Primary Provider: Haze Justin, MD Requesting Provider: Sabino Gasser, MD Chief Complaint: F/U EUS History of Present Illness:     very pleasant 55 year old woman whom I have seen before for endoscopic ultrasound procedures. Her primary gastroenterologist with Dr. Virginia Rochester. He moved from town and so she wishes to establish care with me.   her biggest chronic problem is that of dysphasia.  She is still bothered by this. she describes nonprogressive mixed solid and liquid dysphasia. 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   Dr. Dewayne Shorter. This was in 2001.  Ever since then she has had swallowing difficulty and hoarseness.  Drinking water, eating food can cause feeling of food hanging up in proximal esophagus.  she underwent a barium esophagram one year ago and this showed decreased peristalsis, there were no esophageal narrowing is noted.           Current Medications (verified): 1)  Glucophage Xr 500 Mg  Tb24 (Metformin Hcl) .... Take 2 Tablets Two Times A Day 2)  Mobic 15 Mg Tabs (Meloxicam) .... Once Daily 3)  Klonopin 0.5 Mg Tabs (Clonazepam) .... At Bedtime As Needed 4)  Ultram 50 Mg Tabs (Tramadol Hcl) .... As Needed 5)  Soma 350 Mg Tabs (Carisoprodol) .... As Needed 6)  Flexeril 10 Mg Tabs (Cyclobenzaprine Hcl) .... As Needed 7)  Januvia 100 Mg Tabs (Sitagliptin Phosphate) .... Take 1 Tablet By Mouth Once A Day 8)  Aspir-Low 81 Mg Tbec (Aspirin) .... Once Daily  Allergies (verified): 1)  !  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  All other review of systems was otherwise negative.   Vital Signs:  Patient profile:   55 year old female Height:      69 inches Weight:      251.25 pounds Pulse rate:   68 / minute Pulse rhythm:   regular BP sitting:   134 / 80  (left arm) Cuff size:   large  Vitals Entered By: June McMurray CMA Duncan Dull) (August 22, 2009 8:42 AM)  Physical Exam  Additional Exam:  Constitutional: generally well appearing Psychiatric: alert and oriented times 3 Eyes: extraocular movements intact Mouth: oropharynx moist, no lesions Neck: supple, no lymphadenopathy Cardiovascular: heart regular rate and rythm Lungs: CTA bilaterally Abdomen: soft, non-tender, non-distended, no obvious ascites, no peritoneal signs, normal bowel sounds Extremities: no lower extremity  edema bilaterally Skin: no lesions on visible extremities    Impression & Recommendations:  Problem # 1:  Submucosal gastric lesion she will be put into our recall system for a repeat endoscopic ultrasound at 3 year interval.  Problem # 2:  routine risk for colon cancer she will be put in our reminder system for repeat colonoscopy in 2019  Problem # 3:  dysphasia this  symptom has been ongoing since she had chest, neck radiation 9 years ago. I suspect that there is a result of the radiation.  She is not losing weight. We will repeat barium esophagram now to check for strictures, stenosis and if any are present we will proceed with EGD and dilation. Otherwise she knows that is probably best that she just eat very slowly and chew her food very well.  Patient Instructions: 1)  Recall colonoscoyp 11/2017 2)  Recall EGD/EUS 06/2012 3)  A copy of this information will be sent to Dr. Cleta Alberts. 4)  Eat slowly and chew your food very well. 5)  Barium esophagram.  If narrowing is seen, we will plan for EGD and dilation. 6)  The medication list was reviewed and reconciled.  All changed / newly prescribed medications were explained.  A complete medication list was provided to the patient / caregiver.  Appended Document: Orders Update    Clinical Lists Changes  Orders: Added new Test order of Barium Swallow (Barium Swallow) - Signed

## 2010-11-22 NOTE — Letter (Signed)
Summary: [facility]  [address]   [place], [place] [address]   Phone: [phone]  Fax: [phone]    [facility]  [address]   [place], [place] [address]   Phone: [phone]  Fax: [phone]    Re:    [name] DOB:    October 07, 1956   To whomsoever it may concern  This is to certify that I have evaluated [name] on 01/10/09. In my opinion, she does have some restrictive lung disease & obstructive lung disease causing her to be short of breath on unusual exertion. Her symptoms seem to be exacerbated by dust, musty smells & infections. She also has a history of lung cancer treated by Dr. Cyndie Chime (oncologist) , now in remission for 8 years. She is to undergo a cardiac evaluation for valvular heart disease. She is hereby cleared to return to work. Please help her in whatever manner possible in simplifying her work regimen keeping in mind the above limitations.  Sincerely,     Comer Locket. [name] January 15, 2009.

## 2010-11-22 NOTE — Progress Notes (Signed)
Summary: speak to nurse  Phone Note Call from Patient Call back at Home Phone [phone]   Caller: Patient Call For: Christella Hartigan Reason for Call: Talk to Nurse Summary of Call: Patient wants to speak directyl to nurse Initial call taken by: Tawni Levy,  June 30, 2009 9:53 AM  Follow-up for Phone Call        left message on machine to call back Chales Abrahams CMA Duncan Dull)  June 30, 2009 11:48 AM  Additional Follow-up for Phone Call Additional follow up Details #1::        Talked with pt, she had made an office appointment to return to see Dr Christella Hartigan but has concerns that she needs a EUS at hospital and does not need a rov. By Dr Christella Hartigan report note pt is to have follow up EUS in 12 months from 07/14/2009 but pt hasnt revcieved reminder call. Pt unsure of what she needs to do. Explained to her that I would give Patty the message and someone would return her call this afternoon. Additional Follow-up by: Merri Ray CMA (AAMA),  June 30, 2009 12:40 PM  New Problems: OTHER SPECIFIED DISORDER OF STOMACH AND DUODENUM (ICD-537.89)   Additional Follow-up for Phone Call Additional follow up Details #2::    Dr Christella Hartigan does she need an office visit or can I just schedule her for an EUS.  Chales Abrahams CMA (AAMA)  June 30, 2009 1:19 PM  Additional Follow-up for Phone Call Additional follow up Details #3:: Details for Additional Follow-up Action Taken: straight to EUS is ok.  , radial/linear, gastric submucosal lesion, next avail EUS thursday Additional Follow-up by: Rachael Fee MD,  June 30, 2009 1:29 PM  New Problems: OTHER SPECIFIED DISORDER OF STOMACH AND DUODENUM (ICD-537.89) ROV appt for 07/07/09  cx.  appt for EUS to be scheduled pt informed I will call her next week with the info.  Chales Abrahams CMA Duncan Dull)  June 30, 2009 2:08 PM   pt scheduled for 07/20/09  for EUS pt instructed and instructions mailed to pt home she will call if she has any questions  after receiveing the instructions in the mail.  Chales Abrahams CMA (AAMA)  July 03, 2009 8:28 AM

## 2010-11-22 NOTE — Assessment & Plan Note (Signed)
Summary: 3 months/apc   Copy to:  Dr. Cyndie Chime Primary Provider/Referring Provider:  Haze Justin, MD  CC:  3 month follow-up dyspnea. Pt states she remains SOB..  Pt states she gets OOB with ADL's..  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  She is due for retirement in 3/10 and has been out of work from 12/09 to 3/10. She had increasing difficulty in performing the tasks assigned to her. Dust makes her breathing worse, at times, and musty odor is enough to trigger her dyspnea. She denies wheezing, cough, production, chest pain, palpitation, pedal edema, orthopnea, or paroxysmal nocturnal dyspnea.  labs showed transient elevated creatine kinase 509 and aldolase 12.3 in 10/09, which resolved by 11/09 Bronchodilators have not helped, but  caused tremors and palpitations.  3/10 persistent dyspnea   Current Medications (verified): 1)  Glucophage Xr 500 Mg  Tb24 (Metformin Hcl) .... Take 2 Tablets Two Times A Day 2)  Percocet 5-325 Mg Tabs (Oxycodone-Acetaminophen) .... As Needed 3)  Mobic 15 Mg Tabs (Meloxicam) .... Once Daily 4)  Klonopin 0.5 Mg Tabs (Clonazepam) .... At Bedtime As Needed 5)  Ultram 50 Mg Tabs (Tramadol Hcl) .... As Needed 6)  Soma 350 Mg Tabs (Carisoprodol) .... As Needed 7)  Bayer Aspirin 325 Mg Tabs (Aspirin) .... Take 1 Tablet By Mouth Once A Day  Allergies (verified): 1)  ! Hydrocodone  Past History:  Risk Factors:    Smoking Status: quit (06/19/2007)    Packs/Day: N/A    Cigars/wk: N/A    Pipe Use/wk: N/A    Cans of tobacco/wk: N/A    Passive Smoke Exposure: N/A  Past medical, surgical, family and social histories (including risk factors) reviewed for relevance to current acute and chronic problems.  Past Medical History:    Reviewed history from 09/20/2008 and no changes required:    Diabetes mellitus, type II    Insomnia    Erythema Nodosum    Stage III-B adenocarcinoma of the lung 2001 s/p chemo /  RT, free of occurence for 8 1/2 yrs    submucosal lesion of the gastric fundus. Initial biopsy neg, this may be a leiomyoma or GIST     s/p  TAH-BSO by Dr Nicholas Lose 12/14/07 for uterine fibroids for menorrhagia and dyspareunia    Obesity  Past Surgical History:    Reviewed history from 06/19/2007 and no changes required:    (R) Shoulder surgery (2004)    Laparoscopy (11/2003)  Past Pulmonary History:  Pulmonary History:    PFTs in 2/08 and spirometry in 9/09 have been reviewed. These are consistent with moderate restrictive lung disease. The ratio was normal, which makes obstruction less likely. Lung volumes were decreased as was diffusion capacity, but this corrected for alveolar volume.         Stage IIIB, non-small cell lung cancer was diagnosed in 2001, by Dr. Edwyna Shell, and she subsequently underwent chemotherapy and radiation. Her last CT scan from 8/09, has shown stable postoperative changes and nodular scarring in the left lung. Chest x-ray in 9/09 did not show any acute process.    She underwent right upper lobectomy and pleural abrasions for a spontaneous right pneumothorax by Dr. Orson Aloe in 10/07. The CT also shows postoperative changes in the right lung..    A submucosal lesion has been noted in the gastric fundus by Dr. 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  The patient denies fever, chills, cough, hempotysis, wheezing, chest discomfort, pleuritic chest pain, cyanosis, weakness, fatigue, joint pain/swelling, muscle pain, photosensitivity, dry eyes, dry mouth, and Raynaud's phenomenon.     Vital Signs:  Patient profile:   55 year old female Weight:      250.13 pounds O2 Sat:      98 % Temp:     98.5 degrees F oral Pulse rate:   88 / minute BP sitting:   152 / 70  (left arm) Cuff size:   regular  Vitals Entered By: Cloyde Reams RN (January 10, 2009 9:11 AM)  O2 Sat at Rest %:  98 O2 Flow:  room air CC: 3 month follow-up dyspnea. Pt states she remains SOB..  Pt states she gets OOB with ADL's. Comments Medications reviewed Cloyde Reams RN  January 10, 2009 9:12 AM    Physical Exam  Additional Exam:  Gen. Pleasant, well-nourished, in no distress ENT - no lesions, no post nasal drip Neck: No JVD, no thyromegaly, no carotid bruits Lungs: no use of accessory muscles, no dullness to percussion, clear without rales or rhonchi  Cardiovascular: Rhythm regular, heart sounds  normal, 2/6  murmur at base, no peripheral edema Musculoskeletal: No deformities, no cyanosis or clubbing      Impression & Recommendations:  Problem # 1:  DYSPNEA (ICD-786.05) Assessment Unchanged Likley restriction related to obesity & prior chemo therapy & RT. Obtain echo to r/o valvular heart disease >> moderate AS noted >> cardiac evaluation If no intervention, then pursue pulm rehab. Letter given to return to work.  Problem # 2:  NEOP, MALIG, BRONCHUS/LUNG NEC (ICD-162.8) ct surveillance CT  Complete Medication List: 1)  Glucophage Xr 500 Mg Tb24 (Metformin hcl) .... Take 2 tablets two times a day 2)  Percocet 5-325 Mg Tabs (Oxycodone-acetaminophen) .... As needed 3)  Mobic 15 Mg Tabs (Meloxicam) .... Once daily 4)  Klonopin 0.5 Mg Tabs (Clonazepam) .... At bedtime as needed 5)  Ultram 50 Mg Tabs (Tramadol hcl) .... As needed 6)  Soma 350 Mg Tabs (Carisoprodol) .... As needed 7)  Bayer Aspirin 325 Mg Tabs (Aspirin) .... Take 1 tablet by mouth once a day  Other Orders: Est. Patient Level III (04540) Echo Referral (Echo) Rehabilitation Referral (Rehab)  Patient Instructions: 1)   Please schedule a follow-up appointment in 2 months. 2)  A 2D Echocardiogram has been recommended.  Your imaging study may require preauthorization. 3)  We have recommended that you participate in Pulmonary Rehabilitation Program.  You will need your exercise prescription and copy of your evaluation to be considered for participation. 4)  Pl call me in 1 week for your letter  Appended Document: 3 months/apc fax to PMD, let her know letter ready  Appended Document: 3 months/apc Faxed OV note to Dr Cleta Alberts.  Called spoke with pt advised letter ready for pick-up.  Letter printed and left at front desk for pick-up. EWJ  Appended Document: 3 months/apc PL let her know that [employer] cannot accomodate  her restrictions & she is eligible for disability.  Appended Document: 3 months/apc Called spoke with pt.  Advised of [employer] decision unable to accommodate pt's restrictions, there for pt is eligible for disability.  EWJ

## 2010-11-22 NOTE — Assessment & Plan Note (Signed)
Summary: SURGICAL CLEARANCE/EAR SURGERY/JML   Referring Provider:  Dr. Dorma Russell Primary Provider:  Lesle Chris, MD   History of Present Illness: Primary Cardiologist:  Dr. Dietrich Pates  [name] is a 55 yo female evaluated by Dr. Tenny Craw in 02/2009.  At that time, she was noted to have mild to moderate aortic insufficiency and normal LVF on a recent echo.  At that time, Dr. Tenny Craw recommended a follow up echo in 2 years.  She has a h/o lung CA in 2001 s/p chemotherapy and radiation therapy.  She is chronically followed by Dr. Vassie Loll for pulmonary.  She has chronic dyspnea from restrictive lung disease.  She had a spontaneous pneumothorax in 2007.  She needs ENT surgery and is seen today for surgical clearance.  She has a h/o DOE as noted above.  This has been stable for several years.  She has associated chest tightness.  This is also stable without change.  There is no radiation to her arms or jaw.  She denies associated nausea or diaphoresis.  She denies PND, orthopnea or significant lower extremity edema.  She denies syncope.  Current Medications (verified): 1)  Glucophage Xr 500 Mg  Tb24 (Metformin Hcl) .... Take 2 Tablets Two Times A Day 2)  Flexeril 10 Mg Tabs (Cyclobenzaprine Hcl) .... As Needed 3)  Januvia 100 Mg Tabs (Sitagliptin Phosphate) .... Take 1 Tablet By Mouth Once A Day 4)  Proair Hfa 108 (90 Base) Mcg/act Aers (Albuterol Sulfate) .[name].. 1 To 2 Puffs As Needed  Allergies: 1)  ! 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  CT 11/2009 stable submucosal lesion of the gastric fundus. Initial biopsy neg, this may be a leiomyoma or GIST  s/p  TAH-BSO by Dr Nicholas Lose 12/14/07 for uterine fibroids for menorrhagia and dyspareunia Obesity  Restrictive Lung Disease Nephrolithiasis Aortic Insufficiency   a.  mild to mod AI by echo in  02/2009 with normal EF  Past Surgical History: Reviewed history from 08/22/2009 and no changes required. (R) Shoulder surgery (2004) Laparoscopy (11/2003) chest surgery  Family History: Reviewed history from 08/22/2009 and no changes required. heart disease---Father cancer-mother---pancreatic  CHF - Father  Social History: Reviewed history from 11/03/2009 and no changes required. 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  The patient denies fevers, chills, melena, hematochezia, dysuria, hematuria, rashes, arthralgias, myalgias, heat or cold intolerance.  Please see the HPI.  All other systems reviewed and negative.   Vital Signs:  Patient profile:   55 year old female Height:      69 inches Weight:      243 pounds BMI:     36.01 Pulse rate:   92 / minute Resp:     14 per minute BP sitting:   133 / 80  (left arm)  Vitals Entered By: Kem Parkinson (September 05, 2010 4:06 PM)  Physical Exam  General:  Well nourished, well developed in no acute distress HEENT: Normal Neck: No JVD Cardiac:  Normal S1, S2; RRR; 2/6 systolic murmur at RUSB; no diastolic murmur Lungs:  Clear to auscultation bilaterally, no wheezing, rhonchi or rales Abd: Soft, nontender, no hepatomegaly Ext: No edema Vascular: No carotid  bruits Skin: Warm and dry  MSK:  No deformities Lymph: No adenopathy Endocrine:  No thyromegaly Neuro: CNs 2-12 intact; nonfocal    EKG  Procedure date:  09/05/2010  Findings:      Normal sinus rhythm with rate of:  92 normal axis no ischemic changes   Impression & Recommendations:  Problem # 1:  PRE-OPERATIVE CARDIOVASCULAR EXAMINATION (ICD-V72.81)  She has a coronary artery disease equivalent of diabetes. However, she has stable symptoms that are contributed to her lung disease.  Her EKG is normal.  Her echo was done last year and demonstrated  normal LVF with mild to moderate AI. I reviewed her case with Dr. Myrtis Ser. She requires no further cardiovascular testing prior to her noncardiac surgery.  She should be at acceptable risk. Our service will be available in the perioperative period as necessary.  Problem # 2:  AORTIC INSUFFICIENCY (ICD-424.1) Repeat echo in 02/2011.  Problem # 3:  DYSPNEA (ICD-786.05)  Long h/o this 2/2 restrictive lung disease.  Other Orders: EKG w/ Interpretation (93000) Echocardiogram (Echo)  Patient Instructions: 1)  Your physician has requested that you have an echocardiogram.  Echocardiography is a painless test that uses sound waves to create images of your heart. It provides your doctor with information about the size and shape of your heart and how well your heart's chambers and valves are working.  This procedure takes approximately one hour. There are no restrictions for this procedure.MAY OF 2012

## 2010-11-22 NOTE — Miscellaneous (Signed)
Summary: Orders Update pft charges  Clinical Lists Changes  Orders: Added new Service order of Carbon Monoxide diffusing w/capacity (94720) - Signed Added new Service order of Lung Volumes (94240) - Signed Added new Service order of Spirometry (Pre & Post) (94060) - Signed 

## 2010-11-22 NOTE — Procedures (Signed)
Summary: EUS   EUS  Procedure date:  12/31/2007  Findings:      Location: [facility]    EUS  Procedure date:  12/31/2007  Findings:      Location: [facility]   Patient Name: [name], [name] MRN: [id number] Procedure Procedures: Panendoscopy with EUSCPT: 43259.   with Biopsy(s) / Brushing(s)CPT: [id number].   with Fine Needle Aspiration Personnel: Endoscopist: Rachael Fee, MD.  Referred By: Sabino Gasser, MD.  Exam Location: Exam performed in Endoscopy Suite. Outpatient  Patient Consent: Procedure, Alternatives, Risks and Benefits discussed, consent obtained, from patient. Consent was obtained by the RN.  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  History  Current Medications: Patient is not currently taking Coumadin.  Pre-Exam Physical: Performed Dec 31, 2007. Cardio-pulmonary exam, Abdominal exam, Mental status exam WNL. Abnormal PE findings include: obese.  Comments: Pt. history reviewed/updated, physical exam performed prior to initiation of sedation? yes Exam Exam: Images were taken.  Patient: ASA Classification: II. Tolerance: good.  Sedation Meds:  ~OBJECTIVE5Sedation Meds Patient assessed and found to be appropriate for moderate (conscious) sedation. Fentanyl 175 mcg. given IV. Versed 6 mg. given IV.  Monitoring: BP and pulse monitoring done. Oximetry was used. Supplemental O2 given.  EUS Scopes: Curvilinear Array Echoendoscope used Radial Echoendoscope used   Comments: Endoscopic findings: 1. Normal esophagus. 2. Approximately 2cm submucosal, round lesion in proximal stomach (2- 3cm from GE junction).  The overlying mucosa was normal appearing.  Otherwise the stomach was normal. 3. Normal duodenum.  EUS findings: 1. The submucosal lesion above corresponded with a fairly round, distinct,  heterogeneous, hypoechoic mass measuring 2.2cm by 1.7cm maximally.  The mass was contiguous with the muscularis propria layer of the proximal gastric wall.  The location of the mass in proximal stomach made it difficult to sample with the linear echoendoscope (this scope has a long inflexible tip that cannot retroflex very well).  A single pass with a 25 guage Echo Tip EUS FNA needle using direct visualization and a standard adult upper endoscope. The tissue yield from this pass was quite low but was still sent for cytologic assessment.  Finally tunnel biopsies were performed with jumbo forceps.  There was a small amount of oozing following the biopsies that was stopped with 2cc of dilute epinephrine injection. 2. Remaining gastric wall was normal. 3. Limited view of liver, spleen, pancreas, portal and splenic vessels were all normal. Assessment Upper GI Abnormal examination, see findings above.  Comments: 2.2cm proximal gastric submucosal lesion contiguous with the muscularis propria layer.  This is 2-3cm from GE junction and I do not think it is releted to her main complaint, dysphagia.  The lesion was sampled with incomplete FNA and more successfully via tunnel biopsies with jumbo forceps.  Slight oozing of blood following biopsies was stopped with 2cc of dilute epinephrine injection. Events  Unplanned Intervention: No intervention was required.  Unplanned Events: There were no complications. Plans Comments: Await pathology and cytology.  If these confirm GIST, she should be referred for surgical resection.

## 2010-11-22 NOTE — Assessment & Plan Note (Signed)
Summary: rov//mbw   Visit Type:  Follow-up Copy to:  Dr. Cyndie Chime Primary Provider/Referring Provider:  Haze Justin, MD  CC:  3 month fu visit... SOB....pulmonary rehab good.  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  She  has been out of work from 12/09 to present. She had increasing difficulty in performing the tasks assigned to her. Dust makes her breathing worse, at times, and musty odor is enough to trigger her dyspnea.  labs showed transient elevated creatine kinase 509 and aldolase 12.3 in 10/09, which resolved by 11/09 Bronchodilators have not helped, but  caused tremors and palpitations.  04/27/09 >> persistent dypnea, rehab is helping, has not lost any wt. Crying spell during OV. Wonders if this could be depression/ anxiety. Is not seeing a psychiatrist anymore. CXR - no evidence of recurrence of malignancy.  June 08, 2009  Dyspnea better, rehab helping. She denies wheezing, cough, production, chest pain, palpitation, pedal edema, orthopnea, or paroxysmal nocturnal dyspnea.    Preventive Screening-Counseling & Management  Alcohol-Tobacco     Alcohol drinks/day: 0     Smoking Status: quit > 6 months     Packs/Day: 3 cigs     Year Started: 1990     Year Quit: 2000  Current Medications (verified): 1)  Glucophage Xr 500 Mg  Tb24 (Metformin Hcl) .... Take 2 Tablets Two Times A Day 2)  Mobic 15 Mg Tabs (Meloxicam) .... Once Daily 3)  Klonopin 0.5 Mg Tabs (Clonazepam) .... At Bedtime As Needed 4)  Ultram 50 Mg Tabs (Tramadol Hcl) .... As Needed 5)  Soma 350 Mg Tabs (Carisoprodol) .... As Needed 6)  Flexeril 10 Mg Tabs (Cyclobenzaprine Hcl) .... As Needed 7)  Januvia 100 Mg Tabs (Sitagliptin Phosphate) .... Take 1 Tablet By Mouth Once A Day 8)  Magnesium 250 Mg Tabs (Magnesium) .... Take 1 Tablet By Mouth Once A Day 9)  Aspir-Low 81 Mg Tbec (Aspirin) .... Once Daily  Allergies (verified): 1)  ! Hydrocodone  Past  History:  Past medical history reviewed for relevance to current acute and chronic problems.  Past Medical History: Reviewed history from 09/20/2008 and no changes required. 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 Initial biopsy neg, this may be a leiomyoma or GIST  s/p  TAH-BSO by Dr Nicholas Lose 12/14/07 for uterine fibroids for menorrhagia and dyspareunia Obesity  Past Pulmonary History:  Pulmonary History: PFTs in 2/08 and spirometry in 9/09  reviewed. These are consistent with moderate restrictive lung disease. The ratio was normal, which makes obstruction less likely. Lung volumes were decreased as was diffusion capacity, but this corrected for alveolar volume.   Stage IIIB, non-small cell lung cancer was diagnosed in 2001, by Dr. Edwyna Shell, and she subsequently underwent chemotherapy and radiation. Her last CT scan from 8/09, has shown stable postoperative changes and nodular scarring in the left lung. Chest x-ray in 9/09 did not show any acute process. She underwent right upper lobectomy and pleural abrasions for a spontaneous right pneumothorax by Dr. Orson Aloe in 10/07. The CT also shows postoperative changes in the right lung.. A submucosal lesion has been noted in the gastric fundus by Dr. Christella Hartigan, this may be a luteal myoma or GIST tumor and serial follow up is advised  Social History: Alcohol drinks/day:  0 Smoking Status:  quit > 6  months Packs/Day:  3 cigs  Vital Signs:  Patient profile:   55 year old female Weight:      252.38 pounds BMI:     37.40 O2 Sat:      100 % on Room air Temp:     97.9 degrees F oral Pulse rate:   73 / minute BP sitting:   128 / 72  (left arm) Cuff size:   large  Vitals Entered By: Clarise Cruz Duncan Dull) (June 08, 2009 3:49 PM)  Nutrition Counseling: Patient's BMI is greater than 25 and therefore  counseled on weight management options.  O2 Flow:  Room air  Physical Exam  Additional Exam:  Gen. 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 However, [place] has taken [place] out of work because they could not find her work with the above restrictions. I have encouraged her to get back to work  Orders: Est. Patient Level II (16109)  Medications Added to Medication List This Visit: 1)  Aspir-low 81 Mg Tbec (Aspirin) .... Once daily  Patient Instructions: 1)  Please schedule a follow-up appointment in 4 months with TP

## 2010-11-22 NOTE — Progress Notes (Signed)
Summary: form  Phone Note Call from Patient Call back at Home Phone [phone]   Caller: Patient Call For: [name] Summary of Call: wrong date on form need to talk to nurse Initial call taken by: Rickard Patience,  December 12, 2008 10:23 AM  Follow-up for Phone Call        Spoke with pt.  She states that some of the dates on form filled out by Dr. Vassie Loll were incorrect.  Just an FYI. They are re-faxing form to be corrected. Follow-up by: Vernie Murders,  December 12, 2008 10:42 AM  Additional Follow-up for Phone Call Additional follow up Details #1::        HAVE FORMS BEEN REFAXED FOR CORRECTION  ON DATE Additional Follow-up by: Rickard Patience,  December 12, 2008 4:24 PM    Additional Follow-up for Phone Call Additional follow up Details #2::    date corrected - limited x 3 months since onset 09/05/08 - forms sent Will need FU visit to readdress this if longer period required Follow-up by: Comer Locket. Vassie Loll MD,  December 13, 2008 7:50 PM  Additional Follow-up for Phone Call Additional follow up Details #3:: Details for Additional Follow-up Action Taken: [facility] [place] [facility]  December 14, 2008 4:41 PM   pt called back.  Please call her back.  Eugene Gavia  December 15, 2008 8:24 AM  Spoke with pt and made aware that per Vassie Loll the date on form was corrected on 2-23.  She states that she will keep her followup on 3-23. Vernie Murders  December 15, 2008 8:42 AM

## 2010-11-22 NOTE — Miscellaneous (Signed)
Summary: [facility]  [facility]   Imported By: Lester Nicholson 08/22/2009 09:19:26  _____________________________________________________________________  External Attachment:    Type:   Image     Comment:   External Document

## 2010-11-22 NOTE — Progress Notes (Signed)
Summary: rx-  Phone Note Call from Patient Call back at Home Phone [phone]   Caller: Patient Call For: [name] / [name] Reason for Call: Talk to Nurse Summary of Call: pt has been sneezing, runny nose, cough.  This occurred after she started using Advair.  She has not picked up rx for this yet.  Should she be doing all this?  Please call her w/response. Initial call taken by: Eugene Gavia,  October 16, 2009 3:02 PM  Follow-up for Phone Call        will forward to "doc of the day." [facility] please advise. Thanks! Gweneth Dimitri RN  October 16, 2009 3:29 PM   Additional Follow-up for Phone Call Additional follow up Details #1::        advair can cause a cough, but does not cause runny nose or sneezing.  May have postnasal drip from allergic rhinitis with cough being from postnasal drip? I would continue advair, and try chlorpheniramine 8mg  otc one at bedtime to see if helps. Additional Follow-up by: Barbaraann Share MD,  October 16, 2009 4:37 PM    Additional Follow-up for Phone Call Additional follow up Details #2::    LMTCB. Carron Curie CMA  October 16, 2009 4:40 PM  pt advised of recs.Carron Curie CMA  October 17, 2009 10:05 AM

## 2010-11-22 NOTE — Progress Notes (Signed)
  Phone Note Other Incoming   Call placed by: Dennie Bible from Dr Wende Neighbors office Summary of Call: Dennie Bible from Dr Wende Neighbors office called and said Dr Virginia Rochester wants this pt to have followup with Dr Christella Hartigan for whatever they had done with Dr Christella Hartigan.  I researched this and the pt had an EUS 3/09.  Dennie Bible suggested pt has an office visit with Dr Christella Hartigan for review of EUS.  I called the pt and made appt for her with Dr Christella Hartigan on 06-20-08 A@ 10:15AM.  I told pt to come @ 10Am to fill out paperwork and to bring med list and ins card.  I told her to give [place] 24 - 48 hour notice if she needs to reschedule or she may be charged a cancellation fee. Dr Maud Deed office is faxing to [place] a request from Dr Virginia Rochester for pt to be seen.  Lowry Ram CMA

## 2010-11-22 NOTE — Letter (Signed)
[facility]   Imported By: Sherian Rein 03/05/2010 07:31:51  _____________________________________________________________________  External Attachment:    Type:   Image     Comment:   External Document

## 2010-11-22 NOTE — Procedures (Signed)
Summary: EUS   EUS  Procedure date:  07/14/2008  Findings:      Location: [facility]    Patient Name: [name], [name] MRN: [id number] Procedure Procedures: Panendoscopy with EUSCPT: 43259.  Personnel: Endoscopist: Rachael Fee, MD.  Exam Location: Exam performed in Endoscopy Suite. Outpatient  Patient Consent: Procedure, Alternatives, Risks and Benefits discussed, consent obtained, from patient. Consent was obtained by the RN.  Indications  Assessment: proximal stomach submucosal lesion, measured 2.2cm maximally 3/09: FNA and tunnel biopsies at that time showed no sing of malignancy.  History  Current Medications: Patient is not currently taking Coumadin.  Pre-Exam Physical: Performed Jul 14, 2008. Cardio-pulmonary exam, Abdominal exam, Mental status exam WNL. Abnormal PE findings include: obese.  Comments: Pt. history reviewed/updated, physical exam performed prior to initiation of sedation? yes Exam Exam: Images were taken.  Patient: ASA Classification: II. Tolerance: good.  Sedation Meds:  ~OBJECTIVE5Sedation Meds Patient assessed and found to be appropriate for moderate (conscious) sedation. Fentanyl 100 mcg. given IV. Versed 10 mg. given IV.  Monitoring: BP and pulse monitoring done. Oximetry was used. Supplemental O2 given.  EUS Scopes: Radial Echoendoscope used    Endoscopic findings: 1. Normal esophagus. 2. 1-2cm round, submucosal lesion in proximal stomach, located 3-4cm from GE junction. Overlying mucosa was normal. 3. Normal duodenum.  EUS findings: 1. The submucosal lesion above corresponded with a 2cm by 1.7cm hypoechoic, round, well demarcated lesion that is continuous with the muscularis propria layer of the proximal gastric wall.   2. No pergastric adenopathy. 3. Limited views of liver, spleen, pancreas were all normal   Assessment: 2cm proximal gastric submucosal lesion that is continuous with the muscularis propria  layer of proximal gastric wall.  This lesion has not grown and is probably slightly smaller than it was at the 3/09 EUS examination (was 2.2cm at that point).  FNA and tunnel biopsies at that procedure showed no sign of malignancy.  Given no growth, it was not rebiopsied today.  This lesion is probably a small leiomyoma or GIST and at this size it is safe to simply observe.  Will arrange for repeat upper EUS in 12 months.  If no significant growth at that examination, would then follow with periodic EGD only, with intervals every 2-3 years.

## 2010-11-22 NOTE — Progress Notes (Signed)
Summary: labs to be entered/med need to be called in  Phone Note Call from Patient Call back at Home Phone [phone]   Caller: Patient Summary of Call: PT WAS ON HER MENSTRUAL WHEN SHE SAW DR ELLISION LAST . WAS TOLD BY HIM SHE HAD TO SEE AN UROLOGIST. THAT MAY HAVE CAUSED THE BLOOD IN HER URINE. PT WANT TO KNOW SHOULD SHE STILL GO SEE DR [facility] THE UROLOGIST. ALSO NEED THE DIRECTION  ON HER GLUCOPHAGE HOW TO TAKE Initial call taken by: Shelbie Proctor,  August 10, 2007 10:44 AM  Follow-up for Phone Call        glucophage-xr 500 mg   4 pills/day recheck u/a 599.7   ok to hold-off on urol for now Follow-up by: Minus Breeding MD,  August 10, 2007 12:59 PM  Additional Follow-up for Phone Call Additional follow up Details #1::        pt need rx for the gloucophage called in and also need labs  enter in the computer . routing for labs to [facility], pt uses [address] [phone] need px sent to pharmacy Additional Follow-up by: Shelbie Proctor,  August 10, 2007 1:51 PM    labs entered 08/10/07 for ua

## 2010-11-22 NOTE — Assessment & Plan Note (Signed)
Summary: 4 weeks/apc   Visit Type:  Follow-up Copy to:  Dr. Cyndie Chime Primary Provider/Referring Provider:  Haze Justin, MD  CC:  Pt here for follow up with PFT.  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  This is attributed to restritive lung disease s/p RUlobectomy for spont pneumothx in 10/07. She has been out of work from 12/09 to present.  04/27/09 >> persistent dypnea, rehab is helping, has not lost any wt. Crying spell during OV. Wonders if this could be depression/ anxiety. Is not seeing a psychiatrist anymore. CXR - no evidence of recurrence of malignancy.  June 08, 2009 Dyspnea better, rehab helping.  November 03, 2009 2:37 PM  Spirometry in 12/10 showed decline in FEV1 from  57 to 40%. , full PFT 1/14 showed FEV1 51% & FVC 47%, DLCO was 47% but corrected for alveolar volume Advair may have helped  Denies chest pain,  orthopnea, hemoptysis, fever, n/v/d, edema,.    Allergies: 1)  ! 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 These are consistent with moderate restrictive lung disease. The ratio was normal, which makes obstruction less likely. Lung volumes were decreased as was diffusion capacity, but this corrected for alveolar volume.  Dust made her breathing worse, at times, and musty odor is enough to trigger her dyspnea.    labs showed transient elevated creatine kinase 509 and aldolase 12.3 in 10/09, which resolved by 11/09 Bronchodilators did not help, but  caused tremors and palpitations.  Stage IIIB, non-small cell lung cancer was diagnosed in 2001, by Dr. Edwyna Shell, and she subsequently underwent chemotherapy and radiation. Her last CT scan from 8/09, has shown stable postoperative changes and nodular scarring in the left lung. Chest x-ray in 9/09 did not show any acute process. She underwent right upper lobectomy and pleural abrasions for a spontaneous right pneumothorax by Dr. Dorris Fetch  in 10/07. The CT also shows postoperative changes in the right lung.. A submucosal lesion has been noted in the gastric fundus by Dr. 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  The patient denies anorexia, fever, weight loss, weight gain, vision loss, decreased hearing, hoarseness, chest pain, syncope, peripheral edema, prolonged cough, headaches, hemoptysis, abdominal pain, melena, hematochezia, severe indigestion/heartburn, hematuria, muscle weakness, suspicious skin lesions, difficulty walking, depression, unusual weight change, and abnormal bleeding.    Vital Signs:  Patient profile:   55 year old female Height:      69 inches Weight:      245 pounds O2 Sat:      97 % on Room air Temp:     97.9 degrees F oral Pulse rate:   86 /  minute BP sitting:   102 / 70  (left arm) Cuff size:   large  Vitals Entered By: [name] CMA (November 03, 2009 2:06 PM)  O2 Flow:  Room air CC: Pt here for follow up with PFT Comments Medications reviewed with patient [name] CMA  November 03, 2009 2:08 PM    Physical Exam  Additional Exam:  Gen. Pleasant, obese, in no distress ENT - no  lesions, no post nasal drip Neck: No JVD, no thyromegaly, no carotid bruits Lungs: no use of accessory muscles, no dullness to percussion, clear without rales or rhonchi  Cardiovascular: Rhythm regular, heart sounds  normal, 2/6  murmur at base, no peripheral edema Musculoskeletal: No deformities, no cyanosis or clubbing  Skin: intact    Impression & Recommendations:  Problem # 1:  DYSPNEA (ICD-786.05)  On the basis of restrictive lung disease - RULobectomy, obesity. Doubt asthma since FEV ratio has been normal onmultiple occasions, but will ct advair given symptomatic benefit.  Orders: Est. Patient Level III (40981)  Problem # 2:  NEOP, MALIG, BRONCHUS/LUNG NEC (ICD-162.8) Surveillance per Onc  Patient Instructions: 1)  STay on advair two times a day  2)  Sample of albuterol two times a day as needed  3)  Copy sent toJay gervasi , lawyer (please get fax no from record)

## 2010-11-22 NOTE — Progress Notes (Signed)
Summary: letter  Phone Note Call from Patient Call back at Home Phone [phone]   Caller: Patient Call For: alva Reason for Call: Talk to Nurse Summary of Call: saw dr. last month.  Need another letter to [employer] - ATTN: Abel Presto - it needs to say she is still under the restriction and can't come back for next 3 months/  She also said she is probably going to sign up for retirement. Initial call taken by: Eugene Gavia,  July 17, 2009 11:04 AM  Follow-up for Phone Call        Dr. Vassie Loll are you willing to provide this? Zackery Barefoot CMA  July 17, 2009 11:35 AM   Let her know that I have already sent a letter in march 2010. Unfortunately, I cannot support her position anymore. I have done what I can to help her. Follow-up by: Comer Locket Vassie Loll MD,  July 17, 2009 8:10 PM  Additional Follow-up for Phone Call Additional follow up Details #1::        Dr. Vassie Loll, this pt advised "Dr. Vassie Loll told me that if there is anything I need, let him know and he will help". Pt is going to retire in Jan 2011 and in order to stay on payrole she needs a letter covering until then. Pt wanted me to remind you of above. Please advise. Zackery Barefoot CMA  July 18, 2009 9:51 AM    Pt called back and would like RA's nurse to call her back re: this form  Additional Follow-up by: Eugene Gavia,  July 19, 2009 2:24 PM    Additional Follow-up for Phone Call Additional follow up Details #2::    Called and advised pt that RA out of office until 07/24/2009 per TD and will address same @ that time. Zackery Barefoot CMA  July 19, 2009 4:27 PM   discussed with pt that there was notmuch else I could say to support her application. Lung testing had not shown any abnormality Follow-up by: Comer Locket. Vassie Loll MD,  July 24, 2009 12:19 PM

## 2010-11-22 NOTE — Letter (Signed)
[facility]   Imported By: Lester [place] 09/12/2010 07:51:09  _____________________________________________________________________  External Attachment:    Type:   Image     Comment:   External Document

## 2010-11-22 NOTE — Procedures (Signed)
Summary: Instructions for procedure/[facility]  Instructions for procedure/[facility]   Imported By: Sherian Rein 07/04/2009 14:43:33  _____________________________________________________________________  External Attachment:    Type:   Image     Comment:   External Document

## 2010-11-22 NOTE — Progress Notes (Signed)
Summary: form  Phone Note Call from Patient Call back at [facility] Phone [phone]   Caller: Patient Call For: alva Reason for Call: Talk to Nurse Summary of Call: pt is calling back re: form for alva to fill out.  This, according to previous messages, has been discussed with pt.  I don't understand why she continues to call. Initial call taken by: Eugene Gavia,  August 01, 2009 10:30 AM  Follow-up for Phone Call        called and spoke with pt. informed her per phone note on 07-25-2009 that dr. Vassie Loll will not update form.  pt verbalized understanding and denied any questions.  Megan Reynolds LPN  August 01, 2009 11:08 AM

## 2010-11-22 NOTE — Progress Notes (Signed)
Summary: rx sent to wrong pharmacy  Phone Note Refill Request   Refills Requested: Medication #1:  GLUCOPHAGE XR 500 MG  TB24 4 tabs qd. need this prescription  [facility] [address] /[phone] does not use [facility] anymore .Patient's chart has been requested.    Method Requested: Dr. Tiajuana Amass. Initial call taken by: Shelbie Proctor,  August 14, 2007 10:45 AM  Follow-up for Phone Call        sent Follow-up by: Minus Breeding MD,  August 14, 2007 12:40 PM      Prescriptions: GLUCOPHAGE XR 500 MG  TB24 (METFORMIN HCL) 4 tabs qd  #120 x 11   Entered and Authorized by:   Minus Breeding MD   Signed by:   Minus Breeding MD on 08/14/2007   Method used:   Electronically [facility] to ...       [facility]  [address] [phone]*       [address]       [place], [place]  [address]       Ph: [phone] or [phone]       Fax: [phone]   RxID:   [id number]

## 2010-11-22 NOTE — Letter (Signed)
Summary: [facility]  [facility]   Imported By: Esmeralda Links D'jimraou 07/07/2008 12:46:26  _____________________________________________________________________  External Attachment:    Type:   Image     Comment:   External Document

## 2010-11-22 NOTE — Progress Notes (Signed)
Summary: form-lmtcb  Phone Note Call from Patient   Caller: Patient Call For: alva Summary of Call: need date change on 704 form pls pt todays date . this is the same form that was filled out 6 months ago Initial call taken by: Rickard Patience,  July 25, 2009 11:26 AM  Follow-up for Phone Call        [facility]. Carron Curie CMA  July 25, 2009 12:09 PM   Dr. Vassie Loll, I spoke with this pt and she states that she is retiring this month and needs you to change the date on her last disability form that you filled out for her in April.  I advised her if needs forms filled out she should bring in new forms.  She states that her employer told her that it would be fine you to use the same form as last time.  Form is scanned into the system.  Please advise thanks! Follow-up by: Vernie Murders,  July 26, 2009 11:06 AM  Additional Follow-up for Phone Call Additional follow up Details #1::        I have already told her that I cannot do this. If she insists on my filling out the form, I will have to state that there is no pulmonary cause for disability & this will not be helpful for her application.  Additional Follow-up by: Comer Locket. Vassie Loll MD,  July 28, 2009 9:03 AM    Additional Follow-up for Phone Call Additional follow up Details #2::    Spoke with pt and made aware of Dr. Reginia Naas recs.  Pt verbalized her understanding. Follow-up by: Vernie Murders,  July 28, 2009 9:55 AM

## 2010-11-22 NOTE — Letter (Signed)
Summary: Appt Reminder 2  [facility]  [address] [place], [place] [address]   Phone: [phone]  Fax: [phone]        July 20, 2009 MRN: [id number]    [name] [address] [place], [place]  [address]    Dear Ms. Mesmer,   You have a return appointment with Dr. Christella Hartigan on 08/22/2009 at 8:30am.  Please remember to bring a complete list of the medicines you are taking, your insurance card and your co-pay.  If you have to cancel or reschedule this appointment, please call before 5:00 pm the evening before to avoid a cancellation fee.  If you have any questions or concerns, please call [phone].    Sincerely,    Chales Abrahams CMA (AAMA)  Appended Document: Appt Reminder 2 letter and new pt paperwork mailed

## 2010-11-22 NOTE — Procedures (Signed)
Summary: EUS   EUS  Procedure date:  07/20/2009  Findings:      Location: [facility]   ENDOSCOPIC ULTRASOUND PROCEDURE REPORT  PATIENT:  [name], [name]  MR#:  [id number] BIRTHDATE:   September 02, 1956   GENDER:   female  ENDOSCOPIST:   Rachael Fee, MD  PROCEDURE DATE:  07/20/2009 PROCEDURE:  Upper EUS ASA CLASS:   Class II INDICATIONS: proximal gastric subepithelial lesion (first noted by Dr. Virginia Rochester in early 2009; EUS 3/9 2.2cm, tunnel biopsies negative; EUS 9/9 2.0cm not sampled)  MEDICATIONS:    Fentanyl 75 mcg IV, Versed 7.5 mg IV  DESCRIPTION OF PROCEDURE:   After the risks benefits and alternatives of the procedure were  explained, informed consent was obtained. The patient was then placed in the left, lateral, decubitus postion and IV sedation was administered. Throughout the procedure, the patient's blood pressure, pulse and oxygen saturations were monitored continuously.  Under direct visualization, the [id number] ([id number]) and [id number] ([id number]) endoscope was introduced through the mouth and advanced to the distal stomach.  Water was used as necessary to provide an acoustic interface.  Upon completion of the imaging, water was removed and the patient was sent to the recovery room in satisfactory condition.   Endoscopic findings (limited views with radial and linear echoendoscopes): 1. Smooth bulging of proximal gastric mucosa, quite difficult to fully evaluate endoscopically with echoendoscopes due to proximal gastric location (2-3cm distal to GE junction). 2. 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 Otherwise normal stomach.   EUS findings: 1. The bulging above corresponded with discrete, round, homogeneous, hypoecohoic solid lesion that communicated with muscularis propria layer of gastric wall. The lesion measured 1.8cm maximally. 2. No perigastric adenopathy. 3. Limited views of liver, pancreas, spleen were all normal.  Impression: The proximal  gastric subepithelial lesion measures 1.8cm maximally, clearly has not grown since first noted in early 2009 and actually may be smaller.  Ddx includes leiomyoma, small GIST and at this size following with surviellance EGD in 3 years is reasonable.  My office will arrange for this.  Her primary gastroenterologist, Dr. Virginia Rochester, has moved from the area and she is intertested in me following her general GI needs.  My office will therefore also arrange for new patient office visit in 3-4 weeks to establish care.   _______________________________ Rachael Fee, MD  Appended Document: EUS patty, she needs recall EGD in 3 years and also a new patient office visit with me in 3-4 weeks. (please see if we can get her GI records sent over from Dr. Wende Neighbors office).  Appended Document: EUS letter mailed with appt and recall in IDX, Request made for records from Dr Virginia Rochester  Appended Document: EUS    Clinical Lists Changes  Observations: Added new observation of COLONNXTDUE: 11/2017 (08/22/2009 9:14) Added new observation of EGD DUE: 06/2012 (08/22/2009 9:14)      Appended Document: EUS    Clinical Lists Changes  Observations: Added new observation of EUS: 06/2012 (08/22/2009 9:16)

## 2010-11-22 NOTE — Letter (Signed)
Summary: [facility]  [facility]   Imported By: Esmeralda Links D'jimraou 12/08/2007 13:10:52  _____________________________________________________________________  External Attachment:    Type:   Image     Comment:   External Document

## 2010-11-22 NOTE — Miscellaneous (Signed)
Summary: Waiver of [employer]   Imported By: Esmeralda Links D'jimraou 06/22/2008 13:09:44  _____________________________________________________________________  External Attachment:    Type:   Image     Comment:   External Document

## 2010-11-22 NOTE — Progress Notes (Signed)
Summary: waiting for form/ restrictions- work  Phone Note Call from Patient   Caller: Patient Call For: alva Summary of Call: pt states that [employer] faxed a form # [id number]- re: restrictions due to lung disease. they are still waiting for a reply. pt needs this done asap. asks nurse to call [employer] [phone] and ask for [place] greer- benefits office. pt home # [phone] or cell [phone] Initial call taken by: Tivis Ringer,  January 27, 2009 12:36 PM  Follow-up for Phone Call        Advised pt RA received letter from [facility] and they cannot accomadate her restrictions, therefor she is eligible for disability.  Per RA append on 01/27/09.  Pt needs 703 form completed for work.  Pt will obtain form and bring it in for RA to complete. Follow-up by: Cloyde Reams RN,  January 27, 2009 2:11 PM

## 2010-11-22 NOTE — Progress Notes (Signed)
Summary: form  Phone Note Call from Patient Call back at [phone]   Caller: Patient Call For: alva Reason for Call: Talk to Nurse Summary of Call: pt looking for form she dropped off, 703, for Dr. Vassie Loll to complete.  She needs this form back in the office by 02/02/2009. Initial call taken by: Eugene Gavia,  January 31, 2009 1:46 PM  Follow-up for Phone Call        Dr. Vassie Loll, please advise if you've filled form out.  thanks.  Aundra Millet Reynolds LPN  January 31, 2009 1:50 PM   Spoke with pt and made aware that form is completed.  I faxed this over to her employers attn and made copy so pt could pick up copy per her request. Follow-up by: Vernie Murders,  January 31, 2009 3:21 PM

## 2010-11-22 NOTE — Assessment & Plan Note (Signed)
Summary: 4 month NP follow up dyspnea   Copy to:  Dr. Cyndie Chime Primary Provider/Referring Provider:  Haze Justin, MD  CC:  4 month follow up - states was having SOB at last OV but has worsened shortly after with wheezing and dry cough..  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  She has been out of work from 12/09 to present. She had increasing difficulty in performing the tasks assigned to her. Dust makes her breathing worse, at times, and musty odor is enough to trigger her dyspnea.  labs showed transient elevated creatine kinase 509 and aldolase 12.3 in 10/09, which resolved by 11/09 Bronchodilators have not helped, but  caused tremors and palpitations.  04/27/09 >> persistent dypnea, rehab is helping, has not lost any wt. Crying spell during OV. Wonders if this could be depression/ anxiety. Is not seeing a psychiatrist anymore. CXR - no evidence of recurrence of malignancy.  June 08, 2009 Dyspnea better, rehab helping.   October 06, 2009 --Presents for 4 month follow up - She continues to get dyspneic with minimal activiity. Currently not working b/c the fumes of cleaners cause her breathing to be worse.  Last xray 7/10 with no change. She is on no maintence meds. Previous spirometry 9/09 showed FEV1 1.72 (57%) , ratio 79. Denies chest pain,  orthopnea, hemoptysis, fever, n/v/d, edema,. She is inactive . In past has went to pulmonary rehab ? date.    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 Initial biopsy neg, this may be a leiomyoma or GIST  s/p  TAH-BSO by Dr Nicholas Lose 12/14/07 for  uterine fibroids for menorrhagia and dyspareunia Obesity   Recorded: 10/06/2009 12:19 AM  Parameter  Measured Predicted %Predicted FVC     1.48        3.45        42.90 FEV1     1.12        2.76        40.60 FEV1%   75.77        81.26        93.30 PEF    3.24        6.97        46.60   Interpretation:  Medications Prior to Update: 1)  Glucophage Xr 500 Mg  Tb24 (Metformin Hcl) .... Take 2 Tablets Two Times A Day 2)  Mobic 15 Mg Tabs (Meloxicam) .... Once Daily 3)  Klonopin 0.5 Mg Tabs (Clonazepam) .... At Bedtime As Needed 4)  Ultram 50 Mg Tabs (Tramadol Hcl) .... As Needed 5)  Soma 350 Mg Tabs (Carisoprodol) .... As Needed 6)  Flexeril 10 Mg Tabs (Cyclobenzaprine Hcl) .... As Needed 7)  Januvia 100 Mg Tabs (Sitagliptin Phosphate) .... Take 1 Tablet By Mouth Once A Day 8)  Aspir-Low 81 Mg Tbec (Aspirin) .... Once Daily  Current Medications (verified): 1)  Glucophage Xr 500 Mg  Tb24 (Metformin Hcl) .... Take 2 Tablets Two Times A Day 2)  Mobic 15 Mg Tabs (Meloxicam) .... Once Daily 3)  Klonopin 0.5 Mg Tabs (Clonazepam) .... At Bedtime As Needed 4)  Ultram 50 Mg Tabs (Tramadol  Hcl) .... As Needed 5)  Soma 350 Mg Tabs (Carisoprodol) .... As Needed 6)  Flexeril 10 Mg Tabs (Cyclobenzaprine Hcl) .... As Needed 7)  Januvia 100 Mg Tabs (Sitagliptin Phosphate) .... Take 1 Tablet By Mouth Once A Day 8)  Aspir-Low 81 Mg Tbec (Aspirin) .... Once Daily  Allergies (verified): 1)  ! 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 These are consistent with moderate restrictive lung disease. The ratio was normal, which makes obstruction less likely. Lung volumes were decreased as was diffusion capacity, but this corrected for alveolar volume.   Stage IIIB, non-small cell lung cancer was diagnosed in 2001, by Dr. Edwyna Shell, and she subsequently underwent chemotherapy and radiation. Her last CT scan from 8/09, has shown stable postoperative changes and nodular scarring in the left lung. Chest x-ray in 9/09 did not show any acute process. She underwent right upper lobectomy and pleural abrasions for a spontaneous right pneumothorax by Dr. Orson Aloe in 10/07. The CT also shows postoperative changes in the right lung.. A submucosal lesion has been noted in the gastric fundus by Dr. 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 Is Patient Diabetic? Yes Comments  Medications reviewed with patient Daytime contact number verified with patient. Boone Master CNA  October 06, 2009 11:40 AM     Ambulatory Pulse Oximetry  Resting; HR__81___    02 Sat__97___  Lap1 (185 feet)   HR___97__   02 Sat__97___ Lap2 (185 feet)   HR__102___   02 Sat__95___    Lap3 (185 feet)   HR___108__   02 Sat__95___  _X__Test Completed without Difficulty ___Test Stopped due to:  Boone Master CNA  October 06, 2009 11:58 AM    Physical Exam  Additional Exam:  Gen. Pleasant, obese, in no distress ENT - no lesions, no post nasal drip Neck: No JVD, no thyromegaly, no  carotid bruits Lungs: no use of accessory muscles, no dullness to percussion, clear without rales or rhonchi  Cardiovascular: Rhythm regular, heart sounds  normal, 2/6  murmur at base, no peripheral edema Musculoskeletal: No deformities, no cyanosis or clubbing  Skin: intact    Pulmonary Function Test Date: 10/06/2009 12:19 AM Gender: Female  Pre-Spirometry FVC    Value: 1.48 L/min   % Pred: 42.90 % FEV1    Value: 1.12 L     Pred: 2.76 L     % Pred: 40.60 % FEV1/FVC  Value: 75.77 %     % Pred: 93.30 %  Impression & Recommendations:  Problem # 1:  CHRONIC OBSTRUCTIVE PULMONARY DISEASE (ICD-496)  Spirometry today shows decline in FEV1 to 40%.  will set up for return for full PFT Begin Advair 250/50 two times a day , inhaler demonstration given.  pt to return in 4 weeks follow up  May benefit from pulmonary rehab in future.   Orders: Est. Patient Level IV ([address])  Medications Added to Medication List This Visit: 1)  Advair Diskus 250-50 Mcg/dose Aepb (Fluticasone-salmeterol) .[name].. 1 puff two times a day  Complete Medication List: 1)  Glucophage Xr 500 Mg Tb24 (Metformin hcl) .... Take 2 tablets two times a day 2)  Mobic 15 Mg Tabs (Meloxicam) .... Once daily 3)  Klonopin 0.5 Mg Tabs (Clonazepam) .... At bedtime as needed 4)  Ultram 50 Mg Tabs (Tramadol hcl) .... As needed 5)  Soma 350 Mg  Tabs (Carisoprodol) .... As needed 6)  Flexeril 10 Mg Tabs (Cyclobenzaprine hcl) .... As needed 7)  Januvia 100 Mg Tabs (Sitagliptin phosphate) .... Take 1 tablet by mouth once a day 8)  Aspir-low 81 Mg Tbec (Aspirin) .... Once daily 9)  Advair Diskus 250-50 Mcg/dose Aepb (Fluticasone-salmeterol) .[name].. 1 puff two times a day  Patient Instructions: 1)  Begin Advair 250/50 1 puff two times a day --brush , rinse and gargle after use.  2)  follow up Dr. Vassie Loll in 4 weeks w/ full PFTs 3)  Please contact office for sooner follow up if symptoms do not improve or worsen  Prescriptions: ADVAIR DISKUS 250-50 MCG/DOSE AEPB (FLUTICASONE-SALMETEROL) 1 puff two times a day  #1 x 11   Entered and Authorized by:   Rubye Oaks NP   Signed by:   Rubye Oaks NP on 10/06/2009   Method used:   [facility]  [address] [phone][facility] (retail[facility]       [address]       [place], [place]  [address]       Ph: [phone] or [phone]       Fax: [phone]   RxID:   [id number]    Immunization History:  Influenza Immunization History:    Influenza:  historical (07/21/2009)

## 2010-11-22 NOTE — Progress Notes (Signed)
Summary: dropped off paper- disability  Phone Note Call from Patient Call back at [facility] Phone [phone]   Caller: Patient Call For: alva Summary of Call: pt dropped off a letter explaining what she needs dr Vassie Loll to sign in order for pt to get her disability benefits. says this has been discussed before (see emr notes). wants to make sure dr Vassie Loll understands what she needs. wants to explain further to nurse. call [phone]. i am giving this to td in triage now since [place] r is in hp w/ dr Vassie Loll.   Initial call taken by: Tivis Ringer,  August 03, 2009 3:04 PM  Follow-up for Phone Call        Spoke with pt.  Advised, onnce more, that RA will not be filling out form for her per last several phone notes.  Pt states that she "don't think he understands what I need" .  Pt states that RA filled out forms for her back in April and gave her certain restrictions.  Her employer however, did not honor these restrictions and now pt is going to retire.  Forms placed in RA lookat.  Please advise thanks! Follow-up by: Vernie Murders,  August 03, 2009 3:13 PM  Additional Follow-up for Phone Call Additional follow up Details #1::        Offer OV today to discuss - last pt am or pm  Additional Follow-up by: Comer Locket. Vassie Loll MD,  August 04, 2009 8:43 AM    Additional Follow-up for Phone Call Additional follow up Details #2::    pt unable to come for visit today due to another appt, did schedule pt to see RA on 10/25 at 4:30--slot doubled per RA Follow-up by: Philipp Deputy CMA,  August 04, 2009 12:01 PM

## 2010-11-22 NOTE — Letter (Signed)
Summary: [facility]  [facility]   Imported By: Esmeralda Links D'jimraou 10/06/2008 15:05:09  _____________________________________________________________________  External Attachment:    Type:   Image     Comment:   External Document

## 2010-11-22 NOTE — Assessment & Plan Note (Signed)
Summary: f/u ///kp   Copy to:  Dr. Cyndie Chime Primary Provider/Referring Provider:  Haze Justin, MD  CC:  Pt is here for a f/u appt.  Pt states she is currently in pulm rehab.  Pt states breathing is the same since she was last seen.  Pt states she will occ. have a non-productive cough.  .  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  She is due for retirement in 3/10 and has been out of work from 12/09 to 3/10. She had increasing difficulty in performing the tasks assigned to her. Dust makes her breathing worse, at times, and musty odor is enough to trigger her dyspnea. She denies wheezing, cough, production, chest pain, palpitation, pedal edema, orthopnea, or paroxysmal nocturnal dyspnea.  labs showed transient elevated creatine kinase 509 and aldolase 12.3 in 10/09, which resolved by 11/09 Bronchodilators have not helped, but  caused tremors and palpitations.   04/27/09 >> persistent dypnea, rehab is helping, has not lost any wt. Crying spell during OV. Wonders if this could be depression/ anxiety. Is not seeing a psychiatrist anymore. has been out of work since 1/10. CXR - no evidence of recurrence of malignancy.   Current Medications (verified): 1)  Glucophage Xr 500 Mg  Tb24 (Metformin Hcl) .... Take 2 Tablets Two Times A Day 2)  Mobic 15 Mg Tabs (Meloxicam) .... Once Daily 3)  Klonopin 0.5 Mg Tabs (Clonazepam) .... At Bedtime As Needed 4)  Ultram 50 Mg Tabs (Tramadol Hcl) .... As Needed 5)  Soma 350 Mg Tabs (Carisoprodol) .... As Needed 6)  Flexeril 10 Mg Tabs (Cyclobenzaprine Hcl) .... As Needed 7)  Januvia 100 Mg Tabs (Sitagliptin Phosphate) .... Take 1 Tablet By Mouth Once A Day 8)  Magnesium 250 Mg Tabs (Magnesium) .... Take 1 Tablet By Mouth Once A Day  Allergies (verified): 1)  ! Hydrocodone  Past History:  Past medical, surgical, family and social histories (including risk factors) reviewed for relevance to current acute and  chronic problems.  Past Medical History: Reviewed history from 09/20/2008 and no changes required. 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 Initial biopsy neg, this may be a leiomyoma or GIST  s/p  TAH-BSO by Dr Nicholas Lose 12/14/07 for uterine fibroids for menorrhagia and dyspareunia Obesity  Past Surgical History: Reviewed history from 06/19/2007 and no changes required. (R) Shoulder surgery (2004) Laparoscopy (11/2003)  Past Pulmonary History:  Pulmonary History: PFTs in 2/08 and spirometry in 9/09 have been reviewed. These are consistent with moderate restrictive lung disease. The ratio was normal, which makes obstruction less likely. Lung volumes were decreased as was diffusion capacity, but this corrected for alveolar volume.   Stage IIIB, non-small cell lung cancer was diagnosed in 2001, by Dr. Edwyna Shell, and she subsequently underwent chemotherapy and radiation. Her last CT scan from 8/09, has shown stable postoperative changes and nodular scarring in the left lung. Chest x-ray in 9/09 did not show any acute process. She underwent right upper lobectomy and pleural abrasions for a spontaneous right pneumothorax by Dr. Orson Aloe in 10/07. The CT also shows postoperative changes in the right lung.. A submucosal lesion has been noted in the gastric fundus by Dr. 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 heart disease---Father cancer-mother---pancreatic  Social History: Reviewed history from 09/20/2008 and no changes required. lives alone has boyfriend custodian with school system  Review of Systems       The patient complains of dyspnea on exertion.  The patient denies anorexia, fever, weight loss, weight gain, vision loss,  decreased hearing, hoarseness, chest pain, syncope, peripheral edema, prolonged cough, headaches, hemoptysis, abdominal pain, melena, hematochezia, severe indigestion/heartburn, hematuria, incontinence, muscle weakness, suspicious skin lesions, transient blindness, difficulty walking, depression, unusual weight change, abnormal bleeding, enlarged lymph nodes, and angioedema.    Vital Signs:  Patient profile:   55 year old female Height:      69 inches Weight:      257 pounds O2 Sat:      95 % on Room air Temp:     98.4 degrees F oral Pulse rate:   87 / minute BP sitting:   106 / 68  (left arm) Cuff size:   large  Vitals Entered By: Arman Filter LPN (April 27, 1609 3:54 PM)  O2 Flow:  Room air CC: Pt is here for a f/u appt.  Pt states she is currently in pulm rehab.  Pt states breathing is the same since she was last seen.  Pt states she will occ. have a non-productive cough.   Comments Medications reviewed with patient Arman Filter LPN  April 28, 9603 3:54 PM    Physical Exam  Additional Exam:  Gen. Pleasant, obese, in no distress ENT - no lesions, no post nasal drip Neck: No JVD, no thyromegaly, no carotid bruits Lungs: no use of accessory muscles, no dullness to percussion, clear without rales or rhonchi  Cardiovascular: Rhythm regular, heart sounds  normal, 2/6  murmur at base, no peripheral edema Musculoskeletal: No deformities, no cyanosis or clubbing      CXR  Procedure date:  04/27/2009  Findings:      CHEST - 2 VIEW   Comparison: CT chest of 12/06/2008   Findings:  Left apical pleural thickening and scarring is stable. Surgical clips are noted in the medial left upper lung field.  No active infiltrate or effusion is seen.  The left hemidiaphragm is slightly elevated which appears stable.  Cardiomegaly is unchanged. There are degenerative changes throughout the thoracic spine.   IMPRESSION: Stable postop scarring in the left upper lobe and left apex.   No evidence of recurrence of carcinoma.  No active process.  Impression & Recommendations:  Problem # 1:  NEOP, MALIG, BRONCHUS/LUNG NEC (ICD-162.8) Has done remarkably well with no signs of recurrence. Next surveillance CT for 2/11.  Problem # 2:  DYSPNEA (ICD-786.05) multi-factorial. restriction from obesity & post RT scarring + anxiety/ depression. ct pulm rehab program & wt loss  Problem # 3:  UNDIAGNOSED CARDIAC MURMURS (ICD-785.2) Cardiac evaln noted, mild AI - FU echo in 1 yr (dr Tenny Craw)  Medications Added to Medication List This Visit: 1)  Januvia 100 Mg Tabs (Sitagliptin phosphate) .... Take 1 tablet by mouth once a day 2)  Magnesium 250 Mg Tabs (Magnesium) .... Take 1 tablet by mouth once a day  Other Orders: Est. Patient Level III (54098) T-2 View CXR, Same Day (71020.5TC)  Patient Instructions: 1)  Please schedule a follow-up appointment in 4-5  months. 2)  Breathing test next visit  Appended Document: f/u ///kp to Dr Cleta Alberts  Appended Document: f/u ///kp biscom faxed to Dr. Cleta Alberts

## 2010-11-22 NOTE — Progress Notes (Signed)
  Phone Note Call from Patient Call back at Home Phone [phone]   Refills Requested: Medication #1:  GLUCOPHAGE 1000 MG  TABS take 2 by mouth qd. Initial call taken by: Shelbie Proctor,  August 12, 2007 3:09 PM Reason for Call: Refill Medication Summary of Call: have dr ellision called in her  glocophage since he increased her dosage Initial call taken by: Shelbie Proctor,  August 12, 2007 3:10 PM  Follow-up for Phone Call        sent Follow-up by: Minus Breeding MD,  August 12, 2007 5:10 PM    New/Updated Medications: GLUCOPHAGE XR 500 MG  TB24 (METFORMIN HCL) 4 tabs qd   Prescriptions: GLUCOPHAGE XR 500 MG  TB24 (METFORMIN HCL) 4 tabs qd  #120 x 11   Entered and Authorized by:   Minus Breeding MD   Signed by:   Minus Breeding MD on 08/12/2007   Method used:   Printed then faxed to ...       [facility]       [address]       [address]       [place], [place]  [address]  [place]       Ph: [phone]       Fax: [phone]   RxID:   [id number]

## 2010-11-22 NOTE — Assessment & Plan Note (Signed)
Summary: clearance for surgery per dr Donaciano Eva office/mhh   Visit Type:  Follow-up Copy to:  Dr. Cyndie Chime Primary Provider/Referring Provider:  Haze Justin, MD  CC:  Pt c/o increased SOB with exertion. Pt states she is only using Advair as needed. Pending left ear surgery here today for surgical clearance. Pt is current on flu and pna vaccine but not Tdap.  History of Present Illness: 55/ F,  for pre-op clearance for ear surgery  Dyspnea is attributed to restrictive lung disease s/p RUlobectomy for spont pneumothx in 10/07, s/p chemo RT for stg III B lung cancer in 2001 - in remission since! She has been out of work from 12/09 - still battling workers' comp Underwent rehab in 2010.  November 03, 2009 2:37 PM  Spirometry in 12/10 showed decline in FEV1 from  57 to 40%. , full PFT 1/14 showed FEV1 51% & FVC 47%, DLCO was 47% but corrected for alveolar volume Advair may have helped  September 05, 2010 10:18 AM  Off advair now, surgery planned for otosclerosis uses pro-air as needed , did not desaturate on exertion, CT chest from feb '11 reviewed She underwent lithotomy for ureteral stone - CT abd reviewd from oct '11. CXR 11/3 - unchanged lt scarring  Denies chest pain,  orthopnea, hemoptysis, fever, n/v/d, edema,.    Preventive Screening-Counseling & Management  Alcohol-Tobacco     Alcohol drinks/day: 0     Smoking Status: quit > 6 months     Packs/Day: 3 cigs     Year Started: 1990     Year Quit: 2000     Pack years: 30 yrs 2-3 cigs qd  Allergies: 1)  ! 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 These are consistent with moderate restrictive lung disease. The ratio was normal, which makes obstruction less likely. Lung volumes were decreased as was diffusion capacity, but this corrected for alveolar volume.  Dust made her breathing worse, at times, and musty odor is enough to trigger her dyspnea.  labs showed transient elevated creatine kinase 509 and aldolase 12.3 in 10/09, which resolved by 11/09 Bronchodilators did not help, but  caused tremors and palpitations.  Stage IIIB, non-small cell lung cancer was diagnosed in 2001, by Dr. Edwyna Shell, and she subsequently underwent chemotherapy and radiation. Her last CT scan from feb'11, has shown stable postoperative changes and nodular scarring in the left lung.  She underwent right upper lobectomy and pleural abrasions for a spontaneous right pneumothorax by Dr. Dorris Fetch  in 10/07. The CT also shows postoperative changes in the right lung. A submucosal lesion has been noted in the gastric fundus by Dr. 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  The patient denies anorexia, fever, weight loss, weight gain, vision loss, decreased hearing, hoarseness, chest pain, syncope, peripheral edema, prolonged cough, headaches, hemoptysis, abdominal pain, melena, hematochezia, severe indigestion/heartburn, hematuria, muscle weakness, suspicious skin lesions, depression, unusual weight change, abnormal bleeding,  enlarged lymph nodes, and angioedema.    Vital Signs:  Patient profile:   55 year old female Height:      69 inches Weight:      243 pounds BMI:     36.01 O2 Sat:      100 % on Room air Temp:     97.8 degrees F oral Pulse rate:   89 / minute BP sitting:   114 / 80  (left arm) Cuff size:   large  Vitals Entered By: Zackery Barefoot CMA (September 05, 2010 9:49 AM)  O2 Flow:  Room air CC: Pt c/o increased SOB with exertion. Pt states she is only using Advair as needed. Pending left ear surgery here today for surgical clearance. Pt is current on flu and pna vaccine but not Tdap Comments Medications reviewed with patient Verified contact number and pharmacy with patient Zackery Barefoot CMA  September 05, 2010 9:50 AM    Physical Exam  Additional Exam:  wt 243 September 05, 2010  Gen. Pleasant, obese, in no distress ENT - no lesions, no post nasal drip Neck: No JVD, no thyromegaly, no carotid bruits Lungs: no use of accessory muscles, no dullness to percussion, clear without rales or rhonchi  Cardiovascular: Rhythm regular, heart sounds  normal, 2/6  murmur at base, no peripheral edema Musculoskeletal: No deformities, no cyanosis or clubbing  Skin: intact    Impression & Recommendations:  Problem # 1:  DYSPNEA (ICD-786.05)  Attributed to restrictive lung disease & deconditioning Pro-air gives symptomatic relief - OK to use 2 puffs as needed  weight loss advised OK to proceed with ear surgery  Orders: Est. Patient Level IV (82956)  Problem # 2:  NEOP, MALIG, BRONCHUS/LUNG NEC (ICD-162.8) Surprisingly in remission since 2001 FU with dr Cyndie Chime Annual surveillance CT  Medications Added to Medication List This Visit: 1)  Advair Diskus 250-50 Mcg/dose Aepb (Fluticasone-salmeterol) .[name].. 1 puff two times a day as needed 2)  Proair Hfa 108 (90 Base) Mcg/act Aers (Albuterol sulfate) .[name].. 1 to 2 puffs as needed  Patient Instructions: 1)  Copy sent to: Dr Dorma Russell - the [facility], Dr Cleta Alberts 2)  Please schedule a follow-up appointment in 1 year. 3)  OK to proceed with ear surgery 4)  Ambulatory satn 5)  OK to use pro-air MDI as needed    Immunization History:  Influenza Immunization History:    Influenza:  historical (07/29/2010)  Pneumovax Immunization History:    Pneumovax:  historical (07/29/2010)   Appended Document: clearance for surgery per dr Donaciano Eva office/mhh Ambulatory Pulse Oximetry  Resting; HR__84___    02 Sat__98%RA___  Lap1 (185 feet)   HR__114___   02 Sat__95%RA___ Lap2 (185 feet)   HR__115___   02 Sat__94%RA___    Lap3 (185 feet)   HR__120___   02 Sat__93%RA___  _X__Test Completed without Difficulty ___Test Stopped due to:

## 2010-11-22 NOTE — Letter (Signed)
Summary: Pulmonary Consult Letter  [facility]  [address]   [place], [place] [address]   Phone: [phone]  Fax: [phone]    Re:    [name] DOB:    16-Feb-1956    Dear:    Danae Chen you for requesting that we see the above patient for consultation.      Evaluation today is consistent with:     Thank you for this consultation.  If you have any further questions regarding the care of this patient, please do not hesitate to contact me.  Thank you for this opportunity to look after your patient.  Sincerely,   Comer Locket. Vassie Loll MD September 20, 2008

## 2010-11-22 NOTE — Letter (Signed)
Summary: Pulmonary Consult Letter  [facility]  [address]   [place], [place] [address]   Phone: [phone]  Fax: [phone]    Re:    [name] DOB:    Mar 11, 1956   To whomsoever it may concern  This is to certify that I have evaluated [name] on 09/20/08. In my opinion, she does have some restrictive lung disease & obstructive lung disease causing her to be short of breath on unusual exertion. Her symptoms seem to be exacerbated by dust, musty smells & infections. She also has a history of lung cancer treated by Dr. Cyndie Chime (oncologist) , now in remission for 8 years. Please help her in whatever manner possible in simplifying her work regimen keeping in mind the above limitations.  Sincerely,   Comer Locket. Vassie Loll MD September 20, 2008

## 2010-11-22 NOTE — Progress Notes (Signed)
Summary: Records from Dr Virginia Rochester  Phone Note Outgoing Call Call back at Dr Virginia Rochester office   Call placed by: Chales Abrahams CMA Duncan Dull),  July 20, 2009 9:30 AM Summary of Call: called and asked Dr Wende Neighbors office to send over all her GI records.   Recall EGD in [facility]. Initial call taken by: Chales Abrahams CMA (AAMA),  July 20, 2009 9:31 AM

## 2010-11-22 NOTE — Miscellaneous (Signed)
  Clinical Lists Changes  Observations: Added new observation of CT SCAN: Findings: Post-surgical and post-radiation scarring and bronchiectasis in the left upper lobe and the medial aspect of the superior left lower lobe, unchanged.  Post-surgical pleuroparenchymal scarring in the posterior right upper lobe, unchanged.  Mild scarring in the deep lower lobes, including nodular left lower lobe scarring, unchanged.  No pulmonary parenchymal nodules or masses.  No confluent airspace consolidation.  No pleural effusions currently.   Stable mild pericardial thickening.  Heart size upper normal and stable.  No pericardial effusion.  Mild LAD coronary artery calcification.  Minimal thoracic and upper abdominal aortic atherosclerosis.   No significant mediastinal, hilar, or axillary lymphadenopathy. Visualized thyroid gland unremarkable.   Visualized upper abdomen unremarkable.  Bone window images demonstrate thoracic spondylosis and osteopenia but no evidence of osseous metastatic disease.   IMPRESSION:   1.  No evidence of recurrent or metastatic disease in the chest or upper abdomen. 2.  Stable post-surgical and post-radiation scarring and bronchiectasis in the left lower lobe and superior left lower lobe. Stable post-surgical pleuroparenchymal scarring in the posterior right upper lobe. 3.  Stable mild scarring in the lower lobes, including nodular scarring in the left lower lobe.  No acute cardiopulmonary disease. (12/11/2009 9:37)      CT Scan  Procedure date:  12/11/2009  Findings:      Findings: Post-surgical and post-radiation scarring and bronchiectasis in the left upper lobe and the medial aspect of the superior left lower lobe, unchanged.  Post-surgical pleuroparenchymal scarring in the posterior right upper lobe, unchanged.  Mild scarring in the deep lower lobes, including nodular left lower lobe scarring, unchanged.  No pulmonary parenchymal nodules or masses.   No confluent airspace consolidation.  No pleural effusions currently.   Stable mild pericardial thickening.  Heart size upper normal and stable.  No pericardial effusion.  Mild LAD coronary artery calcification.  Minimal thoracic and upper abdominal aortic atherosclerosis.   No significant mediastinal, hilar, or axillary lymphadenopathy. Visualized thyroid gland unremarkable.   Visualized upper abdomen unremarkable.  Bone window images demonstrate thoracic spondylosis and osteopenia but no evidence of osseous metastatic disease.   IMPRESSION:   1.  No evidence of recurrent or metastatic disease in the chest or upper abdomen. 2.  Stable post-surgical and post-radiation scarring and bronchiectasis in the left lower lobe and superior left lower lobe. Stable post-surgical pleuroparenchymal scarring in the posterior right upper lobe. 3.  Stable mild scarring in the lower lobes, including nodular scarring in the left lower lobe.  No acute cardiopulmonary disease.

## 2010-11-22 NOTE — Letter (Signed)
Summary: [facility]  [facility]   Imported By: Lanelle Bal 01/02/2009 10:52:56  _____________________________________________________________________  External Attachment:    Type:   Image     Comment:   External Document

## 2010-11-22 NOTE — Assessment & Plan Note (Signed)
Summary: DECLINING LUNG FUNCTION/HX CANCER   Visit Type:  Initial Consult Referred by:  Dr. Cyndie Chime PCP:  [name] Justin, MD  Chief Complaint:  pulmonary consultant.......SOB....[name] KitchenMarland Kitchenreviewed meds.........  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 She is due for retirement in 3/10 and has been having increasing difficulty in performing the tasks assigned to her. Dust makes her breathing worse, at times, and musty odor is enough to trigger her dyspnea. She denies wheezing, cough, production, chest pain, palpitation, pedal edema, orthopnea, or paroxysmal nocturnal dyspnea.  labs were reviewed and showed transient elevated creatine kinase 509 and aldolase 12.3 in 10/09, which resolved by 11/09 PFTs in 2/08 and spirometry in 9/09 have been reviewed. These are consistent with moderate restrictive lung disease. The ratio was normal, which makes obstruction less likely. Lung volumes were decreased as was diffusion capacity, but this corrected for alveolar volume.  Bronchodilators have not helped, but it caused tremors and palpitations. Stage IIIB, non-small cell lung cancer was diagnosed in 2001, by Dr. Vale Haven, and she subsequently underwent chemotherapy and radiation. Her last CT scan from 8/09, has shown stable postoperative changes and nodular scarring in the left lung. Chest x-ray in 9/09 did not show any acute process. She underwent right upper lobectomy and pleural abrasions for a spontaneous right pneumothorax by Dr. Orson Aloe in 10/07. The CT also shows postoperative changes in the right lung.. A submucosal lesion has been noted in the gastric fundus by Dr. Christella Hartigan, this may be a luteal myoma or GIST tumor and serial follow up is advised     Updated Prior Medication List: GLUCOPHAGE XR 500 MG  TB24 (METFORMIN HCL) 4 tabs qd CALCIUM 600/VITAMIN D 600-400 MG-UNIT CHEW (CALCIUM CARBONATE-VITAMIN D) once daily PERCOCET  5-325 MG TABS (OXYCODONE-ACETAMINOPHEN) as needed MOBIC 15 MG TABS (MELOXICAM) once daily TESSALON PERLES 100 MG CAPS (BENZONATATE) three times a day KLONOPIN 0.5 MG TABS (CLONAZEPAM) at bedtime ULTRAM 50 MG TABS (TRAMADOL HCL) as needed SOMA 350 MG TABS (CARISOPRODOL) as needed  Current Allergies: ! HYDROCODONE  Past Medical History:    Reviewed history from 06/19/2007 and no changes required:       Diabetes mellitus, type II       Insomnia       Erythema Nodosum       Stage III-B adenocarcinoma of the lung 2001 s/p chemo / RT, free of occurence for 8 1/2 yrs       submucosal lesion of the gastric fundus. 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  The patient denies anorexia, fever, weight loss, weight gain, vision loss, decreased hearing,  hoarseness, chest pain, syncope, peripheral edema, prolonged cough, headaches, hemoptysis, abdominal pain, melena, hematochezia, severe indigestion/heartburn, hematuria, incontinence, genital sores, muscle weakness, suspicious skin lesions, transient blindness, difficulty walking, depression, unusual weight change, abnormal bleeding, enlarged lymph nodes, angioedema, breast masses, and testicular masses.     Vital Signs:  Patient Profile:   55 Years Old Female Height:     69 inches Weight:      248.38 pounds BMI:     36.81 O2 Sat:      95  % O2 treatment:    Room Air Temp:     97.8 degrees F oral Pulse rate:   88 / minute BP sitting:   120 / 60  (left arm) Cuff size:   regular  Pt. in pain?   no  Vitals Entered By: Clarise Cruz Duncan Dull) (September 20, 2008 9:13 AM)                  Physical Exam  General:     healthy appearing and obese.   HEENT - no thrush, no post nasal drip No JVD, no lymphadenopathy CVS- s1s2 nml, no murmur RS- clear, no crackles or rhonchi, VATS scars Abd- soft, non-tender, no organomegaly CNS- non focal Ext - no edema       Impression & Recommendations:  Problem # 1:  DYSPNEA (ICD-786.05) likely related to restrictive lung disease. She quit smoking in 2000 and to resume lung function does not show much evidence of obstruction. A therapeutic trial of bronchodilators. Has not been of any benefit.  As such I do not feel the need to introduce long-acting bronchodilators here. Her main concern at this time is a letter for work and this was given to her. She does seem to have multiple chronic problems, which will require ongoing surveillance and care.  Orders: Consultation Level III (81191)   Problem # 2:  NEOP, MALIG, BRONCHUS/LUNG NEC (ICD-162.8) she will need ongoing surveillance by CT scan, I presume that the next one will be scheduled by Dr. Cyndie Chime in February 2010 Her updated medication list for this problem includes:    Glucophage Xr 500 Mg Tb24 (Metformin hcl) .[name] KitchenMarland KitchenMarland KitchenMarland [name] 4 tabs qd  Orders: Consultation Level III (47829)   Medications Added to Medication List This Visit: 1)  Percocet 5-325 Mg Tabs (Oxycodone-acetaminophen) .... As needed 2)  Mobic 15 Mg Tabs (Meloxicam) .... Once daily 3)  Tessalon Perles 100 Mg Caps (Benzonatate) .... Three times a day 4)  Klonopin 0.5 Mg Tabs (Clonazepam) .... At bedtime 5)  Ultram 50 Mg Tabs (Tramadol hcl) .... As needed 6)  Soma 350 Mg Tabs (Carisoprodol) .... As needed   Patient Instructions: 1)  Please schedule a follow-up  appointment in 3 months. 2)  I will write a letter for you - pl call back in 2 days   ]

## 2010-11-22 NOTE — Progress Notes (Signed)
Summary: Dr Virginia Rochester records  Phone Note Outgoing Call   Call placed by: Chales Abrahams CMA Duncan Dull),  August 17, 2009 10:21 AM Call placed to: Dr Virginia Rochester medical records Summary of Call: Again called to request records from Dr Virginia Rochester.  left message on machine to call back or send all GI records.   Initial call taken by: Chales Abrahams CMA Duncan Dull),  August 17, 2009 10:22 AM     Appended Document: Dr Virginia Rochester records records recieved

## 2010-11-22 NOTE — Assessment & Plan Note (Signed)
   History of Present Illness Visit Type: consult Primary GI MD: Jennye Moccasin PA Primary Provider: Haze Justin, MD Requesting Provider: Sabino Gasser, MD Chief Complaint: follow-up visit/EUS History of Present Illness:      very pleasant 55 year old woman whom I first met this past March, 2009 when she was sent to me by Dr. Sabino Gasser for evaluation of a likely incidental submucosal lesion of her proximal stomach.  Had EUS 6 months ago, this fatty tissue  2.2cm proximal gastric submucosal lesion that was contiguous ith the muscularis propria layer of the gastric wall.  FNA was performed and these results showed no evidence of malignancy. No definitive diagnosis was made by that aspiration and so she was recommended to have a repeat EUS at six-month interval.  NO new symptoms since then.             Prior Medications Reviewed Using: Patient Recall  Updated Prior Medication List: GLUCOPHAGE XR 500 MG  TB24 (METFORMIN HCL) 4 tabs qd CALCIUM 600/VITAMIN D 600-400 MG-UNIT CHEW (CALCIUM CARBONATE-VITAMIN D) once daily  Current Allergies (reviewed today): ! HYDROCODONE      Vital Signs:  Patient Profile:   55 Years Old Female Height:     69 inches Weight:      249.25 pounds BMI:     36.94 Pulse rate:   72 / minute Pulse rhythm:   regular BP sitting:   134 / 78  (left arm)  Vitals Entered By: June McMurray CMA (June 20, 2008 10:20 AM)                  Physical Exam  Constitutional: generally well appearing Psychiatric: alert and oriented times 3 Abdomen: soft, non-tender, non-distended, normal bowel sounds     Impression & Recommendations:  Problem # 1:  submucosal lesion of stomach I will arrange for her to have an endoscopic ultrasound performed in the next one to 2 weeks. If there is no significant change in the size of this lesion and I would likely repeat it in 12 months time.   Patient Instructions: 1)  You will be scheduled to have a upper EUS  examination (60 minutes, follow up of gastric submucosal lesion, next available EUS thursday. 2)  A copy of this information will be sent to Dr. Virginia Rochester, Dr. Cyndie Chime.    ]  Appended Document: Orders Update/EUS    Clinical Lists Changes  Orders: Added new Test order of EUS-Upper (EUS-Upper) - Signed

## 2010-11-22 NOTE — Progress Notes (Signed)
Summary: ov notes/ tests  Phone Note Call from Patient   Caller: Patient Call For: alva Summary of Call: pt wants recent results/ ov notes sent to: dr Cephas Darby, dr Jeannett Senior daubs at urgent care on [address] and dr Edwyna Shell. pt cell # [phone] Initial call taken by: Tivis Ringer,  January 16, 2009 12:41 PM  Follow-up for Phone Call        Sent information requested to providers requested. [name] CMA  January 16, 2009 3:04 PM Pt is aware as well.

## 2010-11-22 NOTE — Miscellaneous (Signed)
Summary: Progress Note/[facility]  Progress Note/[facility]   Imported By: Sherian Rein 06/19/2009 15:01:13  _____________________________________________________________________  External Attachment:    Type:   Image     Comment:   External Document

## 2010-11-22 NOTE — Medication Information (Signed)
Summary: Rx for Diabetic Supplies/[facility]  Rx for Diabetic Supplies/[facility]   Imported By: Esmeralda Links D'jimraou 01/18/2008 13:50:59  _____________________________________________________________________  External Attachment:    Type:   Image     Comment:   External Document

## 2010-11-22 NOTE — Procedures (Signed)
Summary: Instructions for Procedure/[facility] (Out[facility])  Instructions for Procedure/[facility] ([facility])   Imported By: Esmeralda Links D'jimraou 06/22/2008 13:09:03  _____________________________________________________________________  External Attachment:    Type:   Image     Comment:   External Document

## 2010-11-22 NOTE — Assessment & Plan Note (Signed)
Summary: 2:45/np eval abn echo/per dr Vassie Loll   Visit Type:  Initial Consult Referring Provider:  Dr. Cyndie Chime Primary Provider:  Haze Justin, MD  CC:  sob and flutter in chest area   BP  1st time 140/80   2nd  time 130/78 .  History of Present Illness: Patient presents today from referral  of Dr. Vassie Loll for f/u of recent echocardiogram. [name] was told she had a murmur as a child.  She denies chest pain.  Notes occasional fluttering in her chest.  Not associated with dizziness.  No presyncope/syncope.  No PND.  Notes some shortness of breath since 2007 with no recent change.  Problems Prior to Update: 1)  Neop, Malig, Bronchus/lung Nec  (ICD-162.8) 2)  Dyspnea  (ICD-786.05) 3)  Abnormal Findings Gi Tract  (ICD-793.4) 4)  Diabetes Mellitus, Type II  (ICD-250.00) 5)  Lung Cancer, Hx of  (ICD-V10.11)  Medications Prior to Update: 1)  Glucophage Xr 500 Mg  Tb24 (Metformin Hcl) .... Take 2 Tablets Two Times A Day 2)  Percocet 5-325 Mg Tabs (Oxycodone-Acetaminophen) .... As Needed 3)  Mobic 15 Mg Tabs (Meloxicam) .... Once Daily 4)  Klonopin 0.5 Mg Tabs (Clonazepam) .... At Bedtime As Needed 5)  Ultram 50 Mg Tabs (Tramadol Hcl) .... As Needed 6)  Soma 350 Mg Tabs (Carisoprodol) .... As Needed 7)  Bayer Aspirin 325 Mg Tabs (Aspirin) .... Take 1 Tablet By Mouth Once A Day  Current Medications (verified): 1)  Glucophage Xr 500 Mg  Tb24 (Metformin Hcl) .... Take 2 Tablets Two Times A Day 2)  Mobic 15 Mg Tabs (Meloxicam) .... Once Daily 3)  Klonopin 0.5 Mg Tabs (Clonazepam) .... At Bedtime As Needed 4)  Ultram 50 Mg Tabs (Tramadol Hcl) .... As Needed 5)  Soma 350 Mg Tabs (Carisoprodol) .... As Needed 6)  Flexeril 10 Mg Tabs (Cyclobenzaprine Hcl) .... As Needed  Allergies (verified): 1)  ! Hydrocodone  Past History:  Past Medical History:    Diabetes mellitus, type II    Insomnia    Erythema Nodosum    Stage III-B adenocarcinoma of the lung 2001 s/p chemo / RT, free of occurence  for 8 1/2 yrs    submucosal lesion of the gastric fundus. 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    Vital Signs:  Patient profile:   55 year old female Height:      69 inches Weight:      248 pounds Pulse rate:   94 / minute BP sitting:   140 / 80  (left arm)  Vitals Entered By: Burnett Kanaris, CMA (Feb 24, 2009 3:01 PM)  Physical Exam  Additional Exam:  HEENT:  Normocephalic, atraumatic. EOMI, PERRLA.  Neck: JVP is normal. No thyromegaly. No bruits.  Lungs: Some decreased airflow.   No rales no wheezes.  Heart: Regular rate and rhythm. Normal S1, S2. No S3.  2/6  systolic murmur at L sternal border radiating to base.[name] PMI not displaced.  Abdomen:  Supple, nontender. Normal bowel sounds. No masses. No hepatomegaly.  Extremities:   Good distal pulses throughout. No lower extremity edema.  Musculoskeletal :moving all extremities.  Neuro:   alert and oriented x3.    EKG  Procedure date:  11/27/1992  Findings:      NSR. 94 bpm.  Impression & Recommendations:  Problem # 1:  UNDIAGNOSED CARDIAC MURMURS (ICD-785.2) Patient has systolic murmur L sternal border radiating to base. 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  I do not hear a diastolic murmur today even with maneurvs to elicit. The systolic murmur heard is most likely due to increased flow through the AV. I would recomm f/u in 2 years with echo.  BP is under adequate control.  No other changes.  Problem # 2:  DIABETES  MELLITUS, TYPE II (ICD-250.00) Patient with diabetes on oral agents.  I would not plan further testing other than f/u of lipids with medical Rx as needed. The following medications were removed from the medication list:    Bayer Aspirin 325 Mg Tabs (Aspirin) .[name]... Take 1 tablet by mouth once a day Her updated medication list for this problem includes:    Glucophage Xr 500 Mg Tb24 (Metformin hcl) .[name]... Take 2 tablets two times a day  Other Orders: EKG w/ Interpretation (93000) EKG w/ Interpretation (93000)

## 2010-12-07 ENCOUNTER — Other Ambulatory Visit: Payer: Self-pay | Admitting: Oncology

## 2010-12-07 ENCOUNTER — Encounter (HOSPITAL_BASED_OUTPATIENT_CLINIC_OR_DEPARTMENT_OTHER): Payer: Medicare Other | Admitting: Oncology

## 2010-12-07 ENCOUNTER — Encounter (HOSPITAL_COMMUNITY): Payer: Self-pay

## 2010-12-07 ENCOUNTER — Ambulatory Visit (HOSPITAL_COMMUNITY)
Admission: RE | Admit: 2010-12-07 | Discharge: 2010-12-07 | Disposition: A | Discharge status: Home / Self Care | Payer: Medicare Other | Source: Ambulatory Visit | Attending: Oncology | Admitting: Oncology

## 2010-12-07 DIAGNOSIS — C3490 Malignant neoplasm of unspecified part of unspecified bronchus or lung: Secondary | ICD-10-CM

## 2010-12-07 DIAGNOSIS — R0602 Shortness of breath: Secondary | ICD-10-CM | POA: Insufficient documentation

## 2010-12-07 DIAGNOSIS — R079 Chest pain, unspecified: Secondary | ICD-10-CM | POA: Insufficient documentation

## 2010-12-07 DIAGNOSIS — N644 Mastodynia: Secondary | ICD-10-CM | POA: Insufficient documentation

## 2010-12-07 HISTORY — DX: Malignant (primary) neoplasm, unspecified: C80.1

## 2010-12-07 LAB — CBC WITH DIFFERENTIAL/PLATELET
BASO%: 0.4 % (ref 0.0–2.0)
Basophils Absolute: 0 10*3/uL (ref 0.0–0.1)
EOS%: 5.2 % (ref 0.0–7.0)
Eosinophils Absolute: 0.3 10*3/uL (ref 0.0–0.5)
HCT: 35.2 % (ref 34.8–46.6)
HGB: 11.9 g/dL (ref 11.6–15.9)
LYMPH%: 19.1 % (ref 14.0–49.7)
MCH: 31.5 pg (ref 25.1–34.0)
MCHC: 33.9 g/dL (ref 31.5–36.0)
MCV: 93 fL (ref 79.5–101.0)
MONO#: 0.4 10*3/uL (ref 0.1–0.9)
MONO%: 7.8 % (ref 0.0–14.0)
NEUT#: 3.5 10*3/uL (ref 1.5–6.5)
NEUT%: 67.5 % (ref 38.4–76.8)
Platelets: 307 10*3/uL (ref 145–400)
RBC: 3.78 10*6/uL (ref 3.70–5.45)
RDW: 13.1 % (ref 11.2–14.5)
WBC: 5.1 10*3/uL (ref 3.9–10.3)
lymph#: 1 10*3/uL (ref 0.9–3.3)

## 2010-12-07 LAB — CMP (CANCER CENTER ONLY)
ALT(SGPT): 25 U/L (ref 10–47)
AST: 20 U/L (ref 11–38)
Albumin: 3.3 g/dL (ref 3.3–5.5)
Alkaline Phosphatase: 108 U/L — ABNORMAL HIGH (ref 26–84)
BUN, Bld: 15 mg/dL (ref 7–22)
CO2: 28 mEq/L (ref 18–33)
Calcium: 8.7 mg/dL (ref 8.0–10.3)
Chloride: 100 mEq/L (ref 98–108)
Creat: 0.7 mg/dl (ref 0.6–1.2)
Glucose, Bld: 180 mg/dL — ABNORMAL HIGH (ref 73–118)
Potassium: 3.6 mEq/L (ref 3.3–4.7)
Sodium: 144 mEq/L (ref 128–145)
Total Bilirubin: 0.4 mg/dl (ref 0.20–1.60)
Total Protein: 7.1 g/dL (ref 6.4–8.1)

## 2010-12-07 LAB — LACTATE DEHYDROGENASE: LDH: 171 U/L (ref 94–250)

## 2010-12-07 MED ORDER — IOHEXOL 300 MG/ML  SOLN
100.0000 mL | Freq: Once | INTRAMUSCULAR | Status: AC | PRN
  Administered 2010-12-07: 100 mL via INTRAVENOUS

## 2010-12-20 ENCOUNTER — Encounter: Payer: Medicare Other | Attending: Endocrinology | Admitting: *Deleted

## 2010-12-20 DIAGNOSIS — Z713 Dietary counseling and surveillance: Secondary | ICD-10-CM | POA: Insufficient documentation

## 2010-12-20 DIAGNOSIS — E119 Type 2 diabetes mellitus without complications: Secondary | ICD-10-CM | POA: Insufficient documentation

## 2010-12-24 ENCOUNTER — Other Ambulatory Visit: Payer: Self-pay | Admitting: Oncology

## 2010-12-24 ENCOUNTER — Encounter (HOSPITAL_BASED_OUTPATIENT_CLINIC_OR_DEPARTMENT_OTHER): Payer: Medicare Other | Admitting: Oncology

## 2010-12-24 ENCOUNTER — Encounter: Payer: Self-pay | Admitting: Pulmonary Disease

## 2010-12-24 DIAGNOSIS — C3490 Malignant neoplasm of unspecified part of unspecified bronchus or lung: Secondary | ICD-10-CM

## 2010-12-24 DIAGNOSIS — Z1231 Encounter for screening mammogram for malignant neoplasm of breast: Secondary | ICD-10-CM

## 2010-12-31 ENCOUNTER — Other Ambulatory Visit: Payer: Self-pay | Admitting: Otolaryngology

## 2011-01-02 LAB — URINE CULTURE
Colony Count: 2000
Culture  Setup Time: 201110091205

## 2011-01-02 LAB — CBC
HCT: 35.1 % — ABNORMAL LOW (ref 36.0–46.0)
HCT: 32.7 % — ABNORMAL LOW (ref 36.0–46.0)
Hemoglobin: 12 g/dL (ref 12.0–15.0)
Hemoglobin: 11 g/dL — ABNORMAL LOW (ref 12.0–15.0)
MCH: 32 pg (ref 26.0–34.0)
MCH: 31.5 pg (ref 26.0–34.0)
MCHC: 34.1 g/dL (ref 30.0–36.0)
MCHC: 33.6 g/dL (ref 30.0–36.0)
MCV: 93.8 fL (ref 78.0–100.0)
MCV: 93.8 fL (ref 78.0–100.0)
Platelets: 332 10*3/uL (ref 150–400)
Platelets: 345 10*3/uL (ref 150–400)
RBC: 3.74 MIL/uL — ABNORMAL LOW (ref 3.87–5.11)
RBC: 3.49 MIL/uL — ABNORMAL LOW (ref 3.87–5.11)
RDW: 13.1 % (ref 11.5–15.5)
RDW: 12.8 % (ref 11.5–15.5)
WBC: 8.4 10*3/uL (ref 4.0–10.5)
WBC: 6.8 10*3/uL (ref 4.0–10.5)

## 2011-01-02 LAB — GLUCOSE, CAPILLARY
Glucose-Capillary: 118 mg/dL — ABNORMAL HIGH (ref 70–99)
Glucose-Capillary: 119 mg/dL — ABNORMAL HIGH (ref 70–99)
Glucose-Capillary: 134 mg/dL — ABNORMAL HIGH (ref 70–99)
Glucose-Capillary: 139 mg/dL — ABNORMAL HIGH (ref 70–99)

## 2011-01-02 LAB — DIFFERENTIAL
Basophils Absolute: 0 10*3/uL (ref 0.0–0.1)
Basophils Absolute: 0 10*3/uL (ref 0.0–0.1)
Basophils Relative: 0 % (ref 0–1)
Basophils Relative: 0 % (ref 0–1)
Eosinophils Absolute: 0.3 10*3/uL (ref 0.0–0.7)
Eosinophils Absolute: 0.4 10*3/uL (ref 0.0–0.7)
Eosinophils Relative: 4 % (ref 0–5)
Eosinophils Relative: 6 % — ABNORMAL HIGH (ref 0–5)
Lymphocytes Relative: 18 % (ref 12–46)
Lymphocytes Relative: 32 % (ref 12–46)
Lymphs Abs: 1.5 10*3/uL (ref 0.7–4.0)
Lymphs Abs: 2.1 10*3/uL (ref 0.7–4.0)
Monocytes Absolute: 0.8 10*3/uL (ref 0.1–1.0)
Monocytes Absolute: 0.5 10*3/uL (ref 0.1–1.0)
Monocytes Relative: 10 % (ref 3–12)
Monocytes Relative: 8 % (ref 3–12)
Neutro Abs: 5.7 10*3/uL (ref 1.7–7.7)
Neutro Abs: 3.7 10*3/uL (ref 1.7–7.7)
Neutrophils Relative %: 69 % (ref 43–77)
Neutrophils Relative %: 55 % (ref 43–77)

## 2011-01-02 LAB — BASIC METABOLIC PANEL
BUN: 18 mg/dL (ref 6–23)
CO2: 29 mEq/L (ref 19–32)
Calcium: 8.9 mg/dL (ref 8.4–10.5)
Chloride: 106 mEq/L (ref 96–112)
Creatinine, Ser: 0.88 mg/dL (ref 0.4–1.2)
GFR calc Af Amer: >60 mL/min (ref >60)
GFR calc non Af Amer: >60 mL/min (ref >60)
Glucose, Bld: 104 mg/dL — ABNORMAL HIGH (ref 70–99)
Potassium: 3.7 mEq/L (ref 3.5–5.1)
Sodium: 140 mEq/L (ref 135–145)

## 2011-01-02 LAB — POCT I-STAT, CHEM 8
BUN: 19 mg/dL (ref 6–23)
Calcium, Ion: 1.19 mmol/L (ref 1.12–1.32)
Chloride: 105 mEq/L (ref 96–112)
Creatinine, Ser: 0.8 mg/dL (ref 0.4–1.2)
Glucose, Bld: 113 mg/dL — ABNORMAL HIGH (ref 70–99)
HCT: 34 % — ABNORMAL LOW (ref 36.0–46.0)
Hemoglobin: 11.6 g/dL — ABNORMAL LOW (ref 12.0–15.0)
Potassium: 3.9 mEq/L (ref 3.5–5.1)
Sodium: 141 mEq/L (ref 135–145)
TCO2: 27 mmol/L (ref 0–100)

## 2011-01-02 LAB — URINALYSIS, ROUTINE W REFLEX MICROSCOPIC
Bilirubin Urine: NEGATIVE
Bilirubin Urine: NEGATIVE
Glucose, UA: NEGATIVE mg/dL
Glucose, UA: NEGATIVE mg/dL
Glucose, UA: NEGATIVE mg/dL
Ketones, ur: NEGATIVE mg/dL
Leukocytes, UA: NEGATIVE
Nitrite: NEGATIVE
Nitrite: NEGATIVE
Nitrite: NEGATIVE
Protein, ur: NEGATIVE mg/dL
Protein, ur: 100 mg/dL — AB
Protein, ur: 30 mg/dL — AB
Specific Gravity, Urine: 1.025 (ref 1.005–1.030)
Specific Gravity, Urine: 1.02 (ref 1.005–1.030)
Specific Gravity, Urine: 1.022 (ref 1.005–1.030)
Urobilinogen, UA: 0.2 mg/dL (ref 0.0–1.0)
Urobilinogen, UA: 0.2 mg/dL (ref 0.0–1.0)
Urobilinogen, UA: 1 mg/dL (ref 0.0–1.0)
pH: 5.5 (ref 5.0–8.0)
pH: 5.5 (ref 5.0–8.0)
pH: 7 (ref 5.0–8.0)

## 2011-01-02 LAB — URINE MICROSCOPIC-ADD ON

## 2011-01-02 LAB — LIPASE, BLOOD: Lipase: 30 U/L (ref 11–59)

## 2011-01-02 LAB — COMPREHENSIVE METABOLIC PANEL
ALT: 16 U/L (ref 0–35)
AST: 21 U/L (ref 0–37)
Albumin: 3.7 g/dL (ref 3.5–5.2)
Alkaline Phosphatase: 94 U/L (ref 39–117)
BUN: 22 mg/dL (ref 6–23)
CO2: 26 mEq/L (ref 19–32)
Calcium: 9.3 mg/dL (ref 8.4–10.5)
Chloride: 108 mEq/L (ref 96–112)
Creatinine, Ser: 1.21 mg/dL — ABNORMAL HIGH (ref 0.4–1.2)
GFR calc Af Amer: 57 mL/min — ABNORMAL LOW (ref >60)
GFR calc non Af Amer: 47 mL/min — ABNORMAL LOW (ref >60)
Glucose, Bld: 128 mg/dL — ABNORMAL HIGH (ref 70–99)
Potassium: 3.9 mEq/L (ref 3.5–5.1)
Sodium: 140 mEq/L (ref 135–145)
Total Bilirubin: 0.5 mg/dL (ref 0.3–1.2)
Total Protein: 7.7 g/dL (ref 6.0–8.3)

## 2011-01-08 NOTE — Letter (Signed)
Summary: [facility]  [facility]   Imported By: Lennie Odor 01/03/2011 12:01:09  _____________________________________________________________________  External Attachment:    Type:   Image     Comment:   External Document

## 2011-01-29 LAB — GLUCOSE, CAPILLARY
Glucose-Capillary: 103 mg/dL — ABNORMAL HIGH (ref 70–99)
Glucose-Capillary: 110 mg/dL — ABNORMAL HIGH (ref 70–99)
Glucose-Capillary: 114 mg/dL — ABNORMAL HIGH (ref 70–99)
Glucose-Capillary: 121 mg/dL — ABNORMAL HIGH (ref 70–99)
Glucose-Capillary: 143 mg/dL — ABNORMAL HIGH (ref 70–99)
Glucose-Capillary: 95 mg/dL (ref 70–99)
Glucose-Capillary: 106 mg/dL — ABNORMAL HIGH (ref 70–99)
Glucose-Capillary: 115 mg/dL — ABNORMAL HIGH (ref 70–99)
Glucose-Capillary: 119 mg/dL — ABNORMAL HIGH (ref 70–99)
Glucose-Capillary: 142 mg/dL — ABNORMAL HIGH (ref 70–99)
Glucose-Capillary: 150 mg/dL — ABNORMAL HIGH (ref 70–99)
Glucose-Capillary: 267 mg/dL — ABNORMAL HIGH (ref 70–99)
Glucose-Capillary: 92 mg/dL (ref 70–99)
Glucose-Capillary: 96 mg/dL (ref 70–99)

## 2011-01-31 ENCOUNTER — Ambulatory Visit: Payer: BC Managed Care – PPO | Admitting: *Deleted

## 2011-01-31 LAB — BASIC METABOLIC PANEL
BUN: 12 mg/dL (ref 6–23)
CO2: 30 mEq/L (ref 19–32)
Calcium: 9.4 mg/dL (ref 8.4–10.5)
Chloride: 104 mEq/L (ref 96–112)
Creatinine, Ser: 0.74 mg/dL (ref 0.4–1.2)
GFR calc Af Amer: >60 mL/min (ref >60)
GFR calc non Af Amer: >60 mL/min (ref >60)
Glucose, Bld: 138 mg/dL — ABNORMAL HIGH (ref 70–99)
Potassium: 4.6 mEq/L (ref 3.5–5.1)
Sodium: 140 mEq/L (ref 135–145)

## 2011-01-31 LAB — GLUCOSE, CAPILLARY
Glucose-Capillary: 132 mg/dL — ABNORMAL HIGH (ref 70–99)
Glucose-Capillary: 140 mg/dL — ABNORMAL HIGH (ref 70–99)

## 2011-01-31 LAB — POCT HEMOGLOBIN-HEMACUE: Hemoglobin: 12 g/dL (ref 12.0–15.0)

## 2011-02-05 ENCOUNTER — Ambulatory Visit: Payer: BC Managed Care – PPO | Admitting: *Deleted

## 2011-02-07 ENCOUNTER — Ambulatory Visit: Payer: BC Managed Care – PPO | Admitting: *Deleted

## 2011-02-08 ENCOUNTER — Encounter: Payer: Medicare Other | Attending: Endocrinology | Admitting: *Deleted

## 2011-02-08 DIAGNOSIS — Z713 Dietary counseling and surveillance: Secondary | ICD-10-CM | POA: Insufficient documentation

## 2011-02-08 DIAGNOSIS — E119 Type 2 diabetes mellitus without complications: Secondary | ICD-10-CM | POA: Insufficient documentation

## 2011-03-05 NOTE — Op Note (Signed)
NAMEELLIETT, [name] NO.:  [id number]   MEDICAL RECORD NO.:  [id number]          PATIENT TYPE:  AMB   LOCATION:  ENDO                         FACILITY:  [facility]   PHYSICIAN:  Georgiana Spinner, M.D.    DATE OF BIRTH:  September 30, 1956   DATE OF PROCEDURE:  DATE OF DISCHARGE:                               OPERATIVE REPORT   PROCEDURE:  Colonoscopy.   INDICATIONS:  Colon cancer screening.   ANESTHESIA:  Demerol 50 mg and no other medication given.   PROCEDURE:  With the patient mildly sedated in the left lateral  decubitus position, the Pentax videoscopic colonoscope was inserted in  the rectum and passed under direct vision to the cecum identified by the  ileocecal valve and appendiceal orifice both of which were photographed.  From this point, the colonoscope was slowly withdrawn taking  circumferential views of the colonic mucosa stopping to photograph  diverticula seen along the way mostly in the sigmoid colon until we  reached the rectum which appeared normal on direct and showed  hemorrhoids on retroflexed view. The endoscope was straightened and  withdrawn.  The patient's vital signs and pulse oximeter remained  stable.  The patient tolerated the procedure well without apparent  complications.   FINDINGS:  Diverticulosis mild to moderate of the sigmoid colon and  internal hemorrhoids otherwise an unremarkable examination.   PLAN:  See endoscopy note for further details.           ______________________________  Georgiana Spinner, M.D.     GMO/MEDQ  D:  12/14/2007  T:  12/14/2007  Job:  [id number]   cc:   Genene Churn. Cyndie Chime, M.D.  Fax: [phone]

## 2011-03-05 NOTE — Op Note (Signed)
NAMEJENELLA, CRAIGIE                 ACCOUNT NO.:  [id number]   MEDICAL RECORD NO.:  [id number]          PATIENT TYPE:  AMB   LOCATION:  ENDO                         FACILITY:  [facility]   PHYSICIAN:  Gretta Cool, M.D. DATE OF BIRTH:  02/05/1956   DATE OF PROCEDURE:  DATE OF DISCHARGE:  12/14/2007                               OPERATIVE REPORT   PREOPERATIVE DIAGNOSES:  1. Uterine leiomyomata with abnormal uterine bleeding.  2. Dyspareunia.   POSTOPERATIVE DIAGNOSIS:  Extensive uterine leiomyomata.   PROCEDURE:  Total abdominal hysterectomy and bilateral salpingo-  oophorectomy.   SURGEON:  Gretta Cool, MD   ASSISTANT:  Luvenia Redden, MD   ANESTHESIA:  General orotracheal.   DESCRIPTION OF PROCEDURE:  Under excellent general anesthesia as above,  the patient prepped and draped in supine position with Foley catheter  draining the bladder.  A Pfannenstiel incision was made and extended  through the fascia.  The rectus muscles were separated in the midline.  The peritoneum opened.  The abdomen was explored.  There was extensive  adhesion of the omentum to the anterior abdominal wall.  She had some  adhesion down all the way to the level of the laparotomy incision on the  right side.  The other abdominal organs appeared entirely normal.  Examination of the pelvis revealed a very strongly retroverted uterus  with multiple fibroids on the fundus.  Her ovaries appeared  nonfunctional.  At this point, there was no other evidence of pelvic  pathology.  Balfour retractor with a Balfour extender was used.  The  broad ligaments were transected by cautery and the anterior lip of the  broad ligament also opened with cautery.  The infundibulopelvic vessels  were then clamped, cut, sutured, and tied.  A second free tie was used  to doubly ligate the infundibulopelvic.  At this point, the uterine  vessels were skeletonized, clamped, cut, sutured, and tied.  The  cardinal and then  progressively uterosacral ligaments were clamped, cut,  sutured, and tied with 0-Vicryl.  The vagina was approximated with a  running suture of 0-Monocryl.  At this point, the pedicles were all dry.  The pelvic peritoneum was closed with a running suture of #2-0 Monocryl.  The retractors were then removed.  Pelvis irrigated fully to remove all  clot and debris.  At this point, the abdominal peritoneum was closed  with a running suture with #0 Monocryl and the rectus muscles were  plicated with the same suture in the midline.  The fascia was then  approximated from each angle to the midline with a running suture with 0-  Vicryl.  The subcutaneous tissue was approximated with interrupted  sutures of 3-0 Vicryl.  Skin was closed with skin staples and Steri-  Strips.  Throughout the procedure, sponge and lap counts were correct.  No complications.  The patient returned to recovery room in excellent  condition.   ESTIMATED BLOOD LOSS:  Less than 100 mL.   COMPLICATIONS:  None.   TISSUE REMOVED:  Uterus, tubes, ovaries, and cervix.  All submitted to  path.  ______________________________  Gretta Cool, M.D.     CWL/MEDQ  D:  02/18/2008  T:  02/19/2008  Job:  [id number]   cc:   Almedia Balls. Randell Patient, M.D.  Fax: [phone]   Sean A. Everardo All, MD  [address]  [place]  [place] [address]   Genene Churn. Cyndie Chime, M.D.  Fax: [phone]

## 2011-03-05 NOTE — Op Note (Signed)
NAMEJESSICIA, [name]                 ACCOUNT NO.:  [id number]   MEDICAL RECORD NO.:  [id number]           PATIENT TYPE:   LOCATION:                                 FACILITY:   PHYSICIAN:  Harvie Junior, M.D.   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   PRINCIPAL PROCEDURE:  1. Revision carpal tunnel release, this is all on the left side.  2. Thorough tenosynovectomy of the left carpal tunnel and flexor      tendon.   SURGEON:  Harvie Junior, MD.   ASSISTANT:  Marshia Ly, PA.   ANESTHESIA:  General.   BRIEF HISTORY:  [name] is a 55 year old female with a long history of  having had significant left carpal tunnel.  Previously, she has had a  previous carpal tunnel release.  She began having increasing symptoms of  numbness, tingling, clumsiness, pain, swelling, really on the back of  the wrist as well as the front of the wrist.  We talked to her about  treatment options, also felt the most appropriate course of action was  going to be revision carpal tunnel release and tenosynovectomy.  She was  brought to the operating room for this procedure.   PROCEDURE:  The patient was brought to the operating room.  After  adequate anesthesia obtained with general anesthetic, the patient was  placed supine on the operating table.  The left arm was prepped and  draped in usual sterile fashion.  Following this, a curved incision was  made with the carpal tunnel.  This was extended up across the wrist  joint with an angular extension to make sure that there was no tension  on the wrist crease.  Subcutaneous tissues taken down to the level of  the carpal canal which was clearly identified and opened and once  this  was done, the median nerve was clearly identified, protected, and a  thorough synovectomy was performed of all the nine flexor tendons in the  carpal canal, one at a time, top to bottom to make sure that we really  had done a very nice job of removing all the scar tissue and what not  from these tendons.  Once this was completed, the median nerve was again  identified, it was certainly very free at this point.  The motor branch  was identified and noted to be free.  The wound was copiously and  thoroughly irrigated at this point and suctioned dry.  The skin was  closed with interrupted and  running stitches.  Sterile compressive dressing was applied.  The  patient was taken to the recovery room and was noted to be in  satisfactory condition.   ESTIMATED BLOOD LOSS OF PROCEDURE:  None.      Harvie Junior, M.D.  Electronically Signed  JLG/MEDQ  D:  12/23/2008  T:  12/24/2008  Job:  [id number]

## 2011-03-05 NOTE — Op Note (Signed)
[name], WIEGMAN NO.:  [id number]   MEDICAL RECORD NO.:  [id number]          PATIENT TYPE:  AMB   LOCATION:  ENDO                         FACILITY:  [facility]   PHYSICIAN:  Georgiana Spinner, M.D.    DATE OF BIRTH:  1956-08-13   DATE OF PROCEDURE:  DATE OF DISCHARGE:                               OPERATIVE REPORT   PROCEDURES:  Upper endoscopy.   INDICATIONS:  Dysphagia.   ANESTHESIA:  Demerol 150 mg, Versed 4 mg.   PROCEDURE:  With the patient mildly sedated in the left lateral  decubitus position, the Pentax videoscopic endoscope was inserted in the  mouth, passed under direct vision through the esophagus which appeared  normal but there did appeared to be a lack of peristalsis. There was no  evidence of esophagitis and/or Barrett's esophagus seen.  We entered  into the stomach. The fundus, body, antrum, duodenal bulb, and second  portion of duodenum were visualized. From this point, the endoscope was  slowly withdrawn taking circumferential views of the duodenal mucosa  until the endoscope had been pulled back into the stomach, placed in  retroflexion to view the stomach from below. An approximately 2 cm  submucosal mass was seen in the fundus of the stomach up near the  cardia. This was photographed only. The mucosa appeared fairly normal  above it. The endoscope was then straightened and withdrawn taking  circumferential views of the remaining gastric and esophageal mucosa.  The patient's vital signs and pulse oximeter remained stable. The  patient tolerated the procedure well without apparent complication.   FINDINGS:  Submucosal lesion in the fundus of the stomach high up near  the cardia and what appears to be a lack of peristalsis in the  esophagus.   PLAN:  Will do esophageal manometry and also refer the patient to see  Dr. Rob Bunting concerning the possible EUS of the mucosal lesion.           ______________________________  Georgiana Spinner,  M.D.     GMO/MEDQ  D:  12/14/2007  T:  12/14/2007  Job:  [id number]   cc:   Rachael Fee, MD  [address]  [place], [place] [address]   Genene Churn. Cyndie Chime, M.D.  Fax: [phone]   Stan Head. Cleta Alberts, M.D.  Fax: [phone]

## 2011-03-05 NOTE — H&P (Signed)
[name], [name] NO.:  [id number]   MEDICAL RECORD NO.:  [id number]          PATIENT TYPE:  AMB   LOCATION:                                FACILITY:  [facility]   PHYSICIAN:  Gretta Cool, M.D. DATE OF BIRTH:  February 14, 1956   DATE OF ADMISSION:  02/18/2008  DATE OF DISCHARGE:                              HISTORY & PHYSICAL   CHIEF COMPLAINT:  Abnormal bleeding and pelvic pain.   HISTORY OF PRESENT ILLNESS:  [name] is a 55 year old nulligravida,  previous patient of Dr. Christy Gentles.  She was scheduled for surgery by Dr. Christy Gentles.  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   She was also under the care of Dr. Everardo All and Dr. Cyndie Chime.  She has  type 2 diabetes, on metformin, diet, and exercise therapy at present.   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  That disease involved her major  vessels in esophagus and airway.  She has had regular screening  including PET scan recently in February 2009.  According to her history,  is negative for any persisting disease.  She does have a history of  pneumothorax in 2007 and that has been reexpanded now and is doing well.   Her present concerns are perimenopausal state with large posterior  uterine leiomyomata with abnormal uterine bleeding.  The fibroids also  bulge into the cul-de-sac.  She has severe dyspareunia, because of the  fibroids and/or other pathology.  On ultrasound, she has uterine  enlargement that appears to be all related to fibroids.  No evidence of  adnexal pathology.  She was originally scheduled for hysterectomy and  salpingo-oophorectomy.  She has no history of abnormal cytology and  recent Pap is negative.   PAST MEDICAL HISTORY:  Significant for her diabetes and small cell  carcinoma of the lung.  She has had recent multiple knee surgeries,  hand, shoulder, and feet surgeries by Dr. Renae Fickle  and Dr. Luiz Blare.  She had  reexpansion of her lung by Dr. Dareen Piano.   ALLERGIES:  Reports allergy to HYDROCODONE.   MEDICATIONS:  Presently, she is on metformin and Mobic up until last  week and Darvocet as needed for pain.   REVIEW OF SYSTEMS:  HEENT:  Denies symptoms.  COR:  Described as  restricted airway as a result of radiation therapy and her lower airway  distribution.  She is class I as far as activity. GI/GU:  Denies urgency  frequency, dysuria, change in bowel habits, and food intolerance.   PHYSICAL EXAMINATION:  GENERAL:  A well-developed, well-nourished  female, 5 feet 8 inches and 250 pounds with high abdomen to hip ratio.  Her BMI is 38.  HEENT:  Pupils are equal and reactive to light and accommodation.  Fundi  not examined.  Oropharynx clear.  NECK:  Supple without mass or thyroid enlargement.  Light-bearing area  is negative.  BREASTS:  Without mass, nodules, or nipple discharge.  CHEST:  Clear  to PPA.  HEART:  Regular rhythm without murmur or cardiac enlargement.  ABDOMEN:  Soft with large panniculus without palpable mass or  organomegaly.  PELVIC:  External genitalia normal female, vagina clean with diminished  estrogen effect.  Cervix is nulliparous and anteverted.  Her uterus is  displaced backward and the mass of fibroids fills the cul-de-sac.  It is  tender to manipulation.  Difficult to elevate and somewhat adherent.  She is very tender to exam.  Rectovaginal exam confirms.  EXTREMITIES:  Negative.  NEUROLOGIC/PHYSIOLOGIC:   IMPRESSION:  1. Uterine leiomyomata with abnormal uterine bleeding and dyspareunia.  2. History of small cell carcinoma of the lung in 2001 treated by      chemoradiation.  3. History of pneumothorax in 2007 resolved.  4. Type 2 diabetes under the care of Dr. Everardo All with metformin and      diet.   PLAN:  Abdominal hysterectomy, bilateral salpingo-oophorectomy.  Risks  and benefits discussed fully, options and alternatives all.   She is now  admitted for definitive therapy, anesthesia consult preoperatively,  because of her medical concerns.           ______________________________  Gretta Cool, M.D.     CWL/MEDQ  D:  02/17/2008  T:  02/18/2008  Job:  [id number]   cc:   Caren Griffins, C.N.M.   Sean A. Everardo All, MD  [address]  [place]  [place] [address]   Genene Churn. Cyndie Chime, M.D.  Fax: [phone]

## 2011-03-08 NOTE — Op Note (Signed)
[name], [name]                 ACCOUNT NO.:  [id number]   MEDICAL RECORD NO.:  [id number]          PATIENT TYPE:  AMB   LOCATION:  DSC                          FACILITY:  [facility]   PHYSICIAN:  Harvie Junior, M.D.   DATE OF BIRTH:  1956-08-17   DATE OF PROCEDURE:  DATE OF DISCHARGE:                                 OPERATIVE REPORT   DATE OF OPERATION:  July 16, 2004.   PREOPERATIVE DIAGNOSES:  1.  Carpal tunnel syndrome, bilatera1.  2.  Lateral epicondylitis.   POSTOPERATIVE DIAGNOSES:  1.  Carpal tunnel syndrome, bilateral.  2.  Lateral epicondylitis.   PRINCIPAL PROCEDURE:  1.  Left carpal tunnel release.  2.  Injection of left lateral epicondylitis.   SURGEON:  Harvie Junior, MD.   ASSISTANT:  Marshia Ly, PA.   ANESTHESIA:  Arm-based IV regional.   BRIEF HISTORY:  She is a 55 year old female with a long history of having  significant carpal tunnel-like symptoms of both right and left extremity.  She was evaluated in our office and felt to have this.  EMG documentation  showed that she did have bilateral carpal tunnel syndrome.  We tried  conservative treatment including rest, anti-inflammatory medication, and  injection therapy.  All failed, and because of continued complaints of pain,  numbness, tingling in the hands with failure of conservative care, she is  taken to the operating room for left carpal tunnel release.  The patient  also is noted preoperatively to have significant pain over the lateral  epicondyle.  Pain was with wrist extension and supination, and given that we  were going to give her an anesthetic, it was felt that that would be the  appropriate time to do her injection under anesthesia, and she did want to  proceed with this.  She is brought to the operating room for these  procedures.   PROCEDURE:  The patient was brought to the operating room and after adequate  level of anesthesia was obtained with an arm-based IV regional, the  patient  was placed on the operating room table, and the left arm was then prepped  and draped in the usual fashion.  Following this, a curved incision was made  just ulnar to the midline wrist crease and subcutaneous tissues taken down  to the level of the volar and carpal ligaments clearly identified and  divided in line with its fibers.  A Freer elevator was used to make sure  that the nerve was not adherent to the ligament on the undersurface.  The  ligament was then divided in a proximal and distal direction, and once this  was accomplished a gloved finger could be placed in the wound proximally and  distally.  The nerve was identified as well as the motor branch noted to be  freed up completely at this point.  There was no significant synovitis in  the carpal canal.  At this point, the wound was copiously irrigated and  suctioned dry.  The skin was then closed with a combination of interrupted  and running suture.  Attention  then turned to the lateral elbow where the  lateral epicondyle was palpated, and 3 mL of Marcaine with 1 mL of 80 mg/mL  of Depo-Medrol was injected into the left lateral epicondyle for treatment  of lateral epicondylitis.  At this point, the tourniquet was let down, a  sterile compressive dressing was applied as well as a volar plaster over the  carpal tunnel, and a bandage was placed over the left elbow.  The patient  was then taken to the recovery room where she was noted to be in  satisfactory condition.       JLG/MEDQ  D:  07/16/2004  T:  07/16/2004  Job:  [id number]

## 2011-03-08 NOTE — Discharge Summary (Signed)
[name], SCHNIEDERS                           ACCOUNT NO.:  [id number]   MEDICAL RECORD NO.:  [id number]                   PATIENT TYPE:  INP   LOCATION:  0450                                 FACILITY:  [facility]   PHYSICIAN:  Sean A. Everardo All, M.D. [facility]           DATE OF BIRTH:  07-Dec-1955   DATE OF ADMISSION:  03/16/2004  DATE OF DISCHARGE:  03/19/2004                                 DISCHARGE SUMMARY   REASON FOR ADMISSION:  Rash.   HISTORY OF PRESENT ILLNESS:  The patient is a 55 year old woman whom I  admitted on Mar 16, 2004, with a severe rash.  Please refer to my dictated  History and Physical for details.   HOSPITAL COURSE:  The patient was admitted and her only home medication,  metformin, was discontinued despite the fact that it was felt to be  exceedingly unlikely to be the cause of her rash.  She was treated with  Benadryl and steroids as well as antibiotics.  She quickly got better and by  Mar 19, 2004, I would describe her rash as 80% improved from admission.  It  is unclear what intervention, if any, effected the positive outcome.  Her  fever, which was noted upon admission, quickly resolved and did not recur  during her admission.  A number of serologic studies were ordered during the  hospitalization, but the results of none are available at the time of  discharge.   CONDITION ON DISCHARGE:  She is discharged in good condition on Mar 19, 2004.   DISCHARGE DIAGNOSES:  1. Rash of uncertain etiology.  2. Type 2 diabetes.  3. Mild hypokalemia, resolved with supplementation.   DISCHARGE MEDICATIONS:  1. Metformin.  She is advised to resume her home dosage as her home dosage     is not known during this admission.  2. TAC cream 0.025% three times daily as needed for rash.  3. Benadryl 25 mg three times a day as needed for rash.  4. Doxycycline 100 mg twice a day x7 more days.  5. Medrol dose pack.   ACTIVITY:  No restrictions.   DIET:  Continue her attempts at  weight loss.   FOLLOW UP:  She will see me in the office in 10 days for followup.                                               Sean A. Everardo All, M.D. [facility]    SAE/MEDQ  D:  03/19/2004  T:  03/19/2004  Job:  [id number]   cc:   Brett Canales A. Cleta Alberts, M.D.  [address]  [place]  [place] [address]  Fax: [phone]

## 2011-03-08 NOTE — Op Note (Signed)
NAME:  [name], [name]                           ACCOUNT NO.:  [id number]   MEDICAL RECORD NO.:  [id number]                   PATIENT TYPE:  AMB   LOCATION:  DSC                                  FACILITY:  [facility]   PHYSICIAN:  Harvie Junior, M.D.                DATE OF BIRTH:  1955-12-29   DATE OF PROCEDURE:  10/08/2002  DATE OF DISCHARGE:                                 OPERATIVE REPORT   PREOPERATIVE DIAGNOSIS:  Hammer toe, left third, with mass lesion over the  proximal interphalangeal joint.   POSTOPERATIVE DIAGNOSIS:  Hammer toe, left third, with mass lesion over the  proximal interphalangeal joint.   PROCEDURES:  1. Fusion of proximal interphalangeal joint for treatment of hammer tone,     left long toe.  2. Excision of mass, cutaneous, left proximal interphalangeal joint.   SURGEON:  Harvie Junior, M.D.   ASSISTANT:  Marshia Ly, P.A.   ANESTHESIA:  Ankle block and MAC.   BRIEF HISTORY:  She is a 55 year old female with a long history of having  hammer toe of her second toe that was ultimately done and did reasonably  well.  She has developed a hammer toe of the third toe, and she had  significant pain from this mass, callus-type lesion, on the dorsal aspect of  the PIP joint.  Because of continued complaints of pain, we ultimately  talked about treatment options for this and felt that the PIP fusion would  be the most appropriate course of treatment for her hammer toe and that  excision of the mass, if possible, would be the best treatment for that, she  is brought to the operating room for this procedure.   DESCRIPTION OF PROCEDURE:  The patient was brought to the operating room and  after adequate anesthesia was obtained with a block, the patient was placed  supine on the operating table.  The foot was prepped and draped in the usual  sterile fashion.  Following this a curved incision was made over the PIP  joint.  We extended this incision to include the entire  mass of the dorsal  aspect of the toe.  Because of this, excellent exposure was achieved of the  PIP joint, which was sharply incised, and the condyles of the proximal  phalanx were rongeured to a flat surface.  Following this the extensor  tendon was cleansed from the proximal portion of the middle phalanx, and  this was debrided with a rongeur.  Flat on flat surfaces, bony surfaces were  obtained.  At this point a pilot hole was drilled into both the proximal  phalanx and the middle phalanx, and a bioabsorbable pin was then placed into  the toe, and this was advanced into the distal phalanx under direct vision.  At this point the wound was copiously irrigated and suctioned dry.  The toe  was excellently stable  with the bioabsorbable pin.  Careful closure of the  extensor mechanism was performed as well as closure of the skin.  At this  point the sterile compressive dressing and the patient taken to the recovery  room, where she was noted to be in satisfactory condition.  Estimated blood  loss for the procedure was none.                                               Harvie Junior, M.D.    Ranae Plumber  D:  10/08/2002  T:  10/09/2002  Job:  [id number]

## 2011-03-08 NOTE — H&P (Signed)
NAMEASENETH, HACK                           ACCOUNT NO.:  [id number]   MEDICAL RECORD NO.:  [id number]                   PATIENT TYPE:  INP   LOCATION:  0450                                 FACILITY:   PHYSICIAN:  Sean A. Everardo All, M.D. [facility]           DATE OF BIRTH:  04/10/1956   DATE OF ADMISSION:  03/16/2004  DATE OF DISCHARGE:                                HISTORY & PHYSICAL   REASON FOR ADMISSION:  Rash and fever.   HISTORY OF PRESENT ILLNESS:  The patient is a 55 year old woman with 5 days  of a polypustular rash which is moderate on the arms and legs.  There is an  associated wheal and flare rash which is very dry, on the trunk and proximal  extremities.  The wheal and flare rash would  be considered moderate to  severe.  She was prescribed Valtrex in the outpatient setting with no help.   The patient states she also has some difficulty swallowing but on further  discussion, she states she has slight mucus in the throat.   PAST MEDICAL HISTORY:  1. Diabetes.  2. Cancer in the chest, of uncertain type.   MEDICATIONS:  Glucophage.   FAMILY HISTORY:  Negative for the above.   SOCIAL HISTORY:  The patient is single.  She works in the [employer].   REVIEW OF SYSTEMS:  Denies the following.  Nausea or vomiting, painful  urination, abdomen pain, arthralgias, rectal bleeding, hematuria, weight  loss, sore throat, shortness of breath, seizure, headache and visual loss.   PHYSICAL EXAMINATION:  Blood pressure 110/75.  Heart rate is 145,  respiratory rate 20, temperature 100.3.  General: No distress.  Skin: There  is a polypustular rash about the distal extremities.  It is moderate.  On  the trunk and proximal extremities there is a wheal and flare rash which is  very dry, which would  be described as moderate to severe.  It appears to  involve the oral cavity also.  HEENT: Sclerae are non-icteric.  Pharynx  appears to be involved with the rash as noted  above.  Neck supple.  Chest  clear to auscultation.  Cardiovascular: No JVD, no edema.  Tachycardic,  regular rhythm, no murmur.  Pedal pulses are intact.  Abdomen soft, obese,  nontender, no hepatosplenomegaly, no mass.  Genitorectal examination as well  as breast examination not done at this time due to patient condition.  Extremities: No deformity.  Neurologic: Alert, well oriented.  She moves all  fours and cranial nerves appear to be intact.   ADMISSION LABORATORY STUDIES:  Sodium 133, potassium 3.4, random glucose  131.  WBC 14,000, hemoglobin 14.5, platelets 357,000.   IMPRESSION:  1. Skin rash of uncertain etiology.  2. Mild hypokalemia.  3. Type 2     diabetes.  I do not think the Glucophage is the cause of the rash.  4. I  think her swallowing symptoms are not related and they are probably an     unrelated allergic phenomenon.   PLAN:  1. Blood cultures.  2. Antibiotics.  3. Solu-Medrol.  4. H2 blocker.  5.     Antihistamine.  6. Replete potassium.  7. Will discontinue the     Glucophage, although I do not think this is an etiologic factor.                                                Sean A. Everardo All, M.D. [facility]    SAE/MEDQ  D:  03/17/2004  T:  03/18/2004  Job:  [id number]

## 2011-03-08 NOTE — Op Note (Signed)
NAME:  [name], [name]                           ACCOUNT NO.:  [id number]   MEDICAL RECORD NO.:  [id number]                   PATIENT TYPE:  AMB   LOCATION:  DSC                                  FACILITY:  [facility]   PHYSICIAN:  Harvie Junior, M.D.                DATE OF BIRTH:  May 21, 1956   DATE OF PROCEDURE:  12/15/2002  DATE OF DISCHARGE:                                 OPERATIVE REPORT   PREOPERATIVE DIAGNOSIS:  Impingement with acromioclavicular joint arthritis.   POSTOPERATIVE DIAGNOSIS:  Impingement with acromioclavicular joint  arthritis.   PROCEDURE:  1. Subacromial decompression.  2. Distal clavicle excision.   SURGEON:  Harvie Junior, M.D.   ASSISTANT:  Marshia Ly, P.A.   ANESTHESIA:  General with an interscalene block.   INDICATIONS:  The patient is a 55 year old female with a long history of  having  a right significant shoulder pain treated for a prolonged period of  time conservatively with anti-inflammatory medication injection therapy and  physical therapy. Having  failed all conservative care and with the  continued presence of pain, she was ultimately taken to the operating room  for subacromial decompression and distal clavicle excision.   PROCEDURE:  The patient was taken to the operating room and after adequate  anesthesia was administered the patient was placed on the operating table.  She was then moved to the beach chair position. All bony prominences were  well padded.   Attention was turned to the right shoulder where she underwent a routine  arthroscopic examination. The glenohumeral joint looked without evidence of  abnormality. There was no evidence of arthritis. The biceps tendon looked  good. The labrum was without fray. The rotator cuff internally looked fine.   At this point attention was turned out of  the glenohumeral joint into the  subacromial space. There was a tremendous amount of bursal inflammation in  the subacromial space. We  did a subtotal bursectomy and then ultimately did  release of the CA ligament and an anterolateral acromioplasty. Once this was  finished, attention was turned to the distal clavicle where 15 mm of distal  clavicle was excised  and the remaining clavicle was coplaned for about  another 5 mm.   Following this attention was turned back to the acromion where from both a  lateral and a posterior situation the acromion was coplaned. Following this  attention was turned to the bursal tissue and again aggressive bursectomy  was performed at this point and the rotator cuff looked without evidence of  abnormality from the superior surface.   At this point the shoulder was copiously irrigated and suctioned dry. The  arthroscopic portals were closed with a bandage. A sterile compressive  dressing was applied.   The patient was then to the recovery room. She was noted to be in  satisfactory condition.  Harvie Junior, M.D.    Ranae Plumber  D:  12/15/2002  T:  12/15/2002  Job:  [id number]

## 2011-03-08 NOTE — Procedures (Signed)
[facility]  Patient:    [name], [name]                        MRN: [id number] Proc. Date: 01/06/01 Adm. Date:  [id number] Attending:  Sabino Gasser CC:         Dr. Cleta Alberts, [facility]  Genene Churn. Cyndie Chime, M.D.   Procedure Report  PROCEDURE:  Endoscopy with esophageal dilation.  INDICATIONS:  Patient with continued dysphagia for solid foods, barium swallow showing transient delay of tablet.  GASTROENTEROLOGIST:  Sabino Gasser, M.D.  ANESTHESIA:  Demerol 70 mg, Versed 7 mg.  PROCEDURE IN DETAIL:  With the patient mildly sedated and in Room 2 of radiology at [facility], the Olympus video endoscope was inserted in the mouth, passed under direct vision through the esophagus which was relatively adynamic.  We entered into the stomach.  Fundus, body, antrum, duodenal bulb, second portion of duodenum were all well visualized, and she had some mild degree of duodenitis, photographed.  We slowly withdrew the endoscope, taking circumferential views of entire duodenal mucosa until the endoscope had been pulled back into the stomach where a thickened fold was seen in the antrum which was photographed and biopsied.  The endoscope was then placed in retroflexion to view the stomach from below, and a fairly large submucosal lesion was seen, probably over 1 cm in size, high up in the fundus posteriorly.  This was photographed only since it appeared to have significant vasculature entering it at this time.  The  endoscope was then straightened and withdrawn, taking circumferential views of entire gastric and esophageal mucosa.  Once satisfied that we had viewed this well, we reinserted the endoscope back through the esophagus into the stomach, and a guidewire was passed, and the scope was removed.  Over the guidewire was passed a Savary 14 and subsequently 18 mm dilators.  These were passed rather easily under fluoroscopic control.  The guidewire  was removed.  The endoscope was reinserted, and no other lesions were noted.  The endoscope was withdrawn. The patients vital signs and pulse oximetry remained stable.  The patient tolerated the procedure well with no apparent complications.  FINDINGS: 1. Duodenitis with thickened fold in the antrum.  Await biopsy report. 2. Submucosal lesion of fundus of stomach.  Will probably proceed with    endoscopic ultrasound to evaluate further. 3. Esophageal dilation carried out.  PLAN:  Await clinical response.  Will have patient follow up with me as an outpatient. DD:  01/06/01 TD:  01/06/01 Job: [id number]

## 2011-03-08 NOTE — Discharge Summary (Signed)
[name], [name] NO.:  [id number]   MEDICAL RECORD NO.:  [id number]          [name] TYPE:  INP   LOCATION:  9306                          FACILITY:  [facility]   PHYSICIAN:  Gretta Cool, M.D. DATE OF BIRTH:  January 30, 1956   DATE OF ADMISSION:  02/18/2008  DATE OF DISCHARGE:  02/20/2008                               DISCHARGE SUMMARY   CHIEF COMPLAINT:  Abnormal uterine bleeding.   HISTORY OF PRESENT ILLNESS:  [name] is a 55 year old nulligravida,  previous [name] of Dr. Randell [name] for __________  continuing care of already  scheduled hysterectomy for uterine leiomyomata.  She is perimenopausal.  She has uterine enlargement with large fibroids with bulging cul-de-sac,  painful intercourse, and dyspareunia, now admitted for definitive  therapy.  She is also noted to have __________ for type 2 diabetes .  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  No evidence of recurrence of disease.  This disease involved more of esophagus and __________ compromised her  airway and breathing somewhat.  She is now admitted for definitive  therapy.   Past medical history, review of systems as recorded in the history and  physical.   Exam pertinent in this admission is uterus and cervix is anteverted,  displaced upward, the uterus is posterior with enlarged mass bulging cul-  de-sac __________ manipulation feels full.  The cul-de-sac is full and  the uterus and fundus is not easily displaced.  __________   IMPRESSION:  1. Uterine leiomyomata and dyspareunia and type of pelvic pain.  2. Aberration of menses.  3. History of small cell carcinoma of lungs, treated with radiation on      March 28, 2000.  No evidence of disease __________  pneumothorax in      2007, resolved.  __________   COURSE IN THE HOSPITAL:  On February 18, 2008, the [name] underwent  exploratory laparotomy with total abdominal hysterectomy, bilateral  salpingo-oophorectomy by Dr. Beather [name] and __________  postoperatively.  She did well except for __________ status post  procedure.  She was discharged on Feb 20, 2008, comfortable, ambulating  well without  __________ on preoperative medications plus Celebrex 200  mg daily and Percocet 1 every __________.  To return for worsening pain  in 1 week's time.  Report failure of daily improvement and with a  temperature greater than 100.4.  No lifting, straining, no __________,  no shower except baths __________ .           ______________________________  Gretta Cool, M.D.     CWL/MEDQ  D:  03/16/2008  T:  03/17/2008  Job:  [id number]   cc:   Dr. Everardo [name]

## 2011-03-08 NOTE — Discharge Summary (Signed)
[name], [name] NO.:  [id number]   MEDICAL RECORD NO.:  [id number]          PATIENT TYPE:  INP   LOCATION:  2023                         FACILITY:  [facility]   PHYSICIAN:  Salvatore Decent. Dorris Fetch, M.D.DATE OF BIRTH:  Nov 10, 1955   DATE OF ADMISSION:  08/16/2006  DATE OF DISCHARGE:                                 DISCHARGE SUMMARY   PRIMARY DIAGNOSIS:  Right spontaneous pneumothorax.   IN-HOSPITAL DIAGNOSIS:  Ongoing air leak.   SECONDARY DIAGNOSES:  1. History diabetes mellitus type 2.  2. Lung cancer small cell 2001.  3. History of throat stenosis.  4. Osteoarthritis.  5. Bilateral carpal tunnel syndrome.  6. Hammertoe.   IN-HOUSE OPERATIONS AND PROCEDURES:  Right video-assisted thoracoscopic  surgery with lobectomy from right upper lobe and pleural abrasion.   PATIENT'S HISTORY AND PHYSICAL AND HOSPITAL COURSE:  [name] is a 55-year-  old female who presented with a right spontaneous pneumothorax to [facility].  The patient was seen and evaluated by Dr. Dorris Fetch at [facility].  At time of evaluation, it was felt best to place a chest tube.  A 20-French  chest tube was placed August 11, 2006.  The patient tolerated this  procedure well.  The patient was followed.  She showed to have persistent  air leak.  At that time, she was advised to undergo a right video-assisted  thoracoscopic surgery with lobectomy and pleural abrasion for treatment of  spontaneous pneumothorax.  Dr. Dorris Fetch discussed risks and benefits with  the patient.  The patient acknowledged her understanding and agreed to  proceed.  She was transferred over to [facility] on August 16, 2006.  She was scheduled for surgery for August 18, 2006.  For details of  the patient's past medical history and physical exam, please see history and  physical.   The patient was taken to the operating room August 18, 2006 where she  underwent a right video-assisted thoracoscopic  surgery with lobectomy from  lateral and apical right upper lobe and pleural abrasion.  The patient  tolerated this procedure well and was transferred to the intensive care unit  in stable condition.  The patient's postoperative course was pretty much  unremarkable.  She was seen to be stable initially postoperatively.  Pain  was controlled with PCA.  Daily chest x-rays were obtained.  Postop day #1,  no air leak was noted.  Chest x-ray was stable with no pneumothorax.  The  patient's chest tube was placed to water seal at that time.  By postop day  two, chest x-ray again remained stable with no air leak.  Chest tube was  discontinued.  Stat chest x-ray obtained showed no pneumothorax.  The  patient remained stable from pulmonary status.  She was able to be weaned  off oxygen sating greater than 90% prior to discharge home.  During this  course, the patient's vital signs were monitored.  She did develop a low-  grade temperature postoperatively.  This was felt due to atelectasis.  A UA  was checked due to fever.  Currently this is pending.  The patient was out  of bed ambulating well prior to discharge.  It was noted that she has  history of diabetes mellitus but had stopped taking her Glucophage.  CBGs  were slightly elevated postop day #2.  It was felt that this was due to the  D5 fluids.  Fluids were discontinued due to the patient taking adequate p.o.  intake.  Blood sugars continued to be monitored and were stable prior to  discharge.  The patient remained in normal sinus rhythm during her hospital  course.  The patient developed right chest wall wound from first chest tube  placement.  This area had started to drain.  He had been ordered to be  packed daily with iodoform.  Prior to discharge this was stable.   The patient is tentatively ready for discharge home in the next 1-2 days  pending resolution of fever, results of CBC and UA and chest x-rays.   FOLLOW-UP APPOINTMENTS:  A  follow-up appointment had been arranged with Dr.  Dorris Fetch for September 04, 2006 at 12 o'clock.  The patient is to obtain a  PA and lateral chest x-ray 1 hour prior to this appointment.   ACTIVITY:  The patient was instructed no driving until released to do so, no  heavy lifting over 10 pounds.  She is told to ambulate 3-4 times per day,  progress as tolerated.  Continue breathing exercises.   INCISIONAL CARE:  A home health nurse will be arranged for wound care  management.  The patient is to contact the office if she develops any  drainage or opening from any of her incision sites.   DIET:  The patient was instructed on diet to be low-fat, low-salt.   DISCHARGE MEDICATIONS:  1. Darvocet-N 100 1-2 tablet q.4-6h. p.r.n. pain.  2. Mobic 7.5 mg daily.  3. Guaifenesin 1200 mg b.i.d.      [name] Acre Dominick, PA    ______________________________  Salvatore Decent. Dorris Fetch, M.D.    KMD/MEDQ  D:  08/22/2006  T:  08/23/2006  Job:  [id number]   cc:   Salvatore Decent. Dorris Fetch, M.D.

## 2011-03-08 NOTE — Op Note (Signed)
NAME:  [name], [name]                           ACCOUNT NO.:  [id number]   MEDICAL RECORD NO.:  [id number]                   PATIENT TYPE:  AMB   LOCATION:  DSC                                  FACILITY:  [facility]   PHYSICIAN:  Harvie Junior, M.D.                DATE OF BIRTH:  Dec 14, 1955   DATE OF PROCEDURE:  07/20/2003  DATE OF DISCHARGE:                                 OPERATIVE REPORT   PREOPERATIVE DIAGNOSIS:  Retained distal clavicle, status post distal  clavicle resection.   POSTOPERATIVE DIAGNOSIS:  Retained distal clavicle, status post distal  clavicle resection.   PRINCIPAL PROCEDURE:  Right distal clavicle resection, open.   SURGEON:  Harvie Junior, M.D.   ASSISTANT:  Marshia Ly, P.A.   ANESTHESIA:  General.   BRIEF HISTORY:  [name] is a 55 year old female with a long history of  having had right shoulder impingement.  We ultimately did a conservative  care which failed and she ultimately underwent acromioplasty and distal  clavicle resection.  She did wonderfully initially with excellent pain  relief and radiographically, over a period of months, it appeared as though  the distal clavicle was regrowing.  Interestingly, it appeared to really  regrow in a hypertrophic way and because of this, I ultimately felt that on  recurrence of pain with impingement-type testing, that resection was  appropriate and we thought this was best done open; she is brought to the  operating room for this procedure.   PROCEDURE:  The patient was brought to the operating room and after adequate  anesthesia was obtained with a general anesthetic, the patient was placed in  the beach-chair position, all bony prominences well-padded.  Attention was  then turned to the right shoulder and it was prepped and draped in the usual  sterile fashion.  Following this, a linear incision was made over the right  shoulder.  Subcutaneous tissue was dissected down to the level of the delto-  trapezial fascia.  This was opened longitudinally, giving access to what  appeared to be essentially a normal-appearing distal clavicle, quite  interestingly.  The [facility] joint was again intact.  There was fluid in the  joint.  We basically did a dissection with Bovie completely all the way  around the clavicle.  It was malformed and did go deeper than the original  distal clavicle.  Especially anteriorly, there appeared to be some  heterotopic-type tissue which was excised and the Bovie was used throughout  the case.  Once the full body of the clavicle had been identified, a 2.5-cm  section of the distal clavicle was resected in total.  This was removed en  bloc and appeared to be essentially a normal-appearing distal clavicle with  some bony outgrowths, essentially heterotopic bone, anterior and posterior  and inferior.  This was again debrided.  All cartilaginous-type tissue was  debrided out of  the joint at this point.  A Bovie was used to Bovie all of  the tissue in the front and backside of this area and the bleeding was  essentially controlled at this point.  The wound was copiously irrigated at  this point and no evidence of continued bleeding.  The delto-trapezial  fascia was closed with a 1 Vicryl running suture, the skin was closed with 0  and 2-0  Vicryl and a 3-0 Maxon pullout suture.  Benzoin and Steri-Strips were  applied and the patient was taken to the recovery room where she was noted  to be in satisfactory condition.  Estimated blood loss for the procedure was  less than 50 mL.                                               Harvie Junior, M.D.    Ranae Plumber  D:  07/20/2003  T:  07/20/2003  Job:  [id number]

## 2011-03-08 NOTE — Discharge Summary (Signed)
[place]. [facility]  Patient:    [name]                         MRN: [id number] Adm. Date:  [id number] Disc. Date: [id number] Attending:  Cameron Proud Dictator:   Luretha Rued Ezzard Standing, P.A.-C. CC:         [name], M.D.                           Discharge Summary  DATE OF SURGERY:  October 25, 1999  DATE OF BIRTH:  [birth year]  ADMITTING DIAGNOSES: 1. Dysphagia. 2. History of carpal tunnel syndrome. 3. Bilateral foot surgery. 4. Status post right knee surgery. 5. Status post arthroscopy of anterior cruciate ligament on the left. 6. Status post appendectomy. 7. Aortopulmonary nodule.  DISCHARGE DIAGNOSES: 1. Aortopulmonary nodule status post left video-assisted thoracic surgery, left    mini thoracotomy, left upper lobe biopsy, left upper lobe node biopsy, multiple    node biopsies, resection of multiple blebs, and aortopulmonary window biopsy    tumor. 2. Small cell carcinoma status post left video-assisted thoracic surgery. 3. Bilateral foot surgery. 4. Status post right knee surgery. 5. Status post arthroscopy of anterior cruciate ligament on the left. 6. Status post appendectomy. 7. Aortopulmonary nodule.  PROCEDURE PERFORMED DURING THIS ADMISSION:  Left video-assisted thoracic surgery, left mini thoracotomy, left upper lobe biopsy, left upper lobe node biopsy, multiple node biopsies, resection of multiple blebs, and aortopulmonary window biopsy.  HISTORY OF PRESENT ILLNESS:  [name] is a pleasant 55 year old African-American obese female referred by [name] after patient presented with chief complaint f dysphagia.  An esophagogram was performed which was negative.  A chest CT was recommended for further evaluation of her dysphagia.  On October 01, 1999, a CT demonstrated enlarged aortic pulmonary window node of about 3.3 cm.  She is now  being admitted for further evaluation.  HOSPITAL COURSE:  [name] was admitted to the hospital on October 25, 1999, underwent a left video-assisted thoracic surgery with left mini thoracotomy and  resultant left upper lobe biopsies with left upper lobe multiple biopsies of lymph nodes and resection of multiple blebs.  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  [name] tolerated her admission and surgery without difficulty.  She did extremely well  postoperatively on the first day.  Oncology and [name] was consulted for further evaluation and treatment.  She remained in the hospital for several days. She underwent a bone scan and brain scan that were negative.  She was discharged home on January 11 after oncology consultation and follow-up plans were arranged.  DISCHARGE MEDICATIONS:  Tylox one to two tablets every 4 to 6 hours as needed for pain.  DISCHARGE ACTIVITIES:  No heavy lifting, no strenuous activity, no driving a car.  DIET:  As directed.  WOUND CARE:  She is to keep her wounds clean and dry.  FOLLOW-UP:  She is to get a chest x-ray from [facility] and see [name] at 3 p.m. on November 09, 1999.  She is to follow up with her oncology appointment with [name] as scheduled. DD:  12/24/99 TD:  12/25/99 Job: [id number]

## 2011-03-08 NOTE — Consult Note (Signed)
[place]. [facility]  Patient:    [name]                         MRN: [id number] Proc. Date: 10/26/99 Adm. Date:  [id number] Attending:  Cameron [name] CC:         Dr. Lew [name], M.D.             [name], M.D.             [name], M.D.                          Consultation Report  HISTORY OF PRESENT ILLNESS:  The patient is a 55 year old woman who smokes a few cigarettes daily but less than a half of a pack. She returned from a trip to see her sister in [place] in October. She started to notice difficulty swallowing nd a sticking sensation in her retrosternal area. She called [name], M.D. for n appointment. In the interim, she kept her appointment for her yearly physical exam by Dr[facility] Cleta [name] she was [facility] on September 06, 1999. As part of hat evaluation, he did a chest radiograph which was suspicious for the presence of  left hilar mass. She did go onto to see Dr. Virginia [name]; and a upper GI series was done; and she was told that her esophagus was normal. A CT scan was done at [facility] on December 11 in follow-up of the chest radiograph and showed a 3.3  1.9 cm ______ lymph node in the AP window without any obvious pulmonary parenchymal nodules. There was advanced bullous disease of the lungs. She has no clinical history of asthma or recurrent infections. On the CT, increased density was noted in the left breast tissue. She tells me that she also had mammography at that time and that Dr. Katrinka [name] reviewed the mammograms with her and told her that  they were normal.  She was admitted here on January 4 and underwent a left VATS procedure, median thoracotomy, and biopsy of the AP window node, as well as resection of apical bullae by [name], M.D. Operative findings included a large mass in the AP window. A biopsy was sent for frozen section and then a larger  biopsy was taken. Some random lung tissue was submitted along with the specimen from the area of he AP window, I assume. Pathology shows a large-cell undifferentiated carcinoma in  both the node and in adjacent pulmonary parenchyma. There are some areas which suggest squamous differentiation.  PAST MEDICAL HISTORY:  Unremarkable. She has had an appendectomy, carpal tunnel  surgery, foot surgery by [name], M.D. No severe medical illness. No chronic medications.  FAMILY HISTORY:  She is one of eleven children. Two died either in childbirth or shortly after. The remainder of her siblings are alive, and there is no cancer n the immediate family although a maternal aunt has lung cancer, and she is currently my patient Nikyah [name]).  SOCIAL HISTORY:  She is single. No children. 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 There may be some asbestos exposure in that facility. She does not use alcohol. She only smokes a few cigarettes a ay.  PHYSICAL EXAMINATION:  GENERAL:  A pleasant,  overweight woman who is uncomfortable and in pain from her recent surgery. Only a limited exam could be done.  HEENT/NECK:  Normal without any lymphadenopathy. There is a right internal jugular catheter in place.  LUNGS:  The anterior lungs are clear. There is a left chest tube.  BREASTS:  There is diffuse nodularity primarily in the left breast. Breasts are  large. There are no dominant masses.  ABDOMEN:  Soft, obese, nontender. No obvious mass or organomegaly.  EXTREMITIES:  No edema. No calf tenderness.  NEUROLOGICAL:  Grossly normal with motor strength 5/5. Reflexes absent but symmetric at the knees.  PERTINENT PREOPERATIVE LABORATORY DATA:  On January 3:  Hemoglobin 11.7, hematocrit 32, MCV 88, white count 8600, 58% neutrophils, platelets 262,000. Sodium 134, potassium 3.8, BUN 14, creatinine 0.6, calcium 8.5, albumin 3.4, SGOT 23,  SGPT 2, alkaline phosphatase 122, repeat value 103, bilirubin 0.3. Protime 13.4 seconds.  Outpatient EKG done on September 10, 1999, was normal.  IMPRESSION:  Young woman with a moderate smoking history who presents with dysphagia as the first sign of a large malignant lymph node in the aortopulmonary window. 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  Findings are most consistent with a primary lung cancer. I would like to obtain her recent mammograms, however, to confirm that these were negative. Clinically, this would be an unusual presentation for breast cancer, however.  Right now, she is a clinical stage III, non-small cell lung cancer. As such, optimal treatment will involve a combination of chemotherapy and radiation therapy. [name], M.D. will also be seeing the patient, and I will coordinate a treatment plan with him. Diagnosis and overview of treatment discussed with the  patient and some family members this evening.  Thank you for this consultation. DD:  10/30/99 TD:  10/30/99 Job: [id number]

## 2011-03-08 NOTE — Op Note (Signed)
[facility], [place]  Patient:    [name], [name] Visit Number: [id number] MRN: [id number]          Service Type: END Location: ENDO Attending Physician:  Sabino Gasser Dictated by:   Sabino Gasser, M.D. Admit Date:  08/27/2001   CC:         Dr. Earl Lites, [facility]  Genene Churn. Cyndie Chime, M.D.  Trellis Paganini, M.D.   Operative Report  PREOPERATIVE DIAGNOSIS:  POSTOPERATIVE DIAGNOSIS:  OPERATION/PROCEDURE:  Upper endoscopy with dilation.  SURGEON:  Sabino Gasser, M.D.  INDICATIONS:  Continued dysphagia, had transient improvement with previous esophageal dilation.  ANESTHESIA:  Demerol 50, Versed 5 mg.  DESCRIPTION OF PROCEDURE:  With the patient mildly sedated in room 2 of radiology the Olympus videoscopic endoscope was inserted into the mouth and passed under direct vision through the esophagus and no definite extrinsic compression or stricture was seen.  There appeared to be somewhat decreased peristalsis.  We entered into the stomach.  The fundus, body, antrum, duodenal bulb, and second portion of the duodenum were well visualized and appeared normal.  From this point the endoscope was slowly withdrawn taking circumferential views of the duodenal mucosa until the endoscope then pulled back into the stomach and placed in retroflexion to view the stomach from below.  The endoscope was then straightened and a guidewire was passed under fluoroscopic control.  It was monitored in to stay in the stomach as the endoscope was withdrawn taking circumferential views of the remaining gastric and esophageal mucosa which grossly appeared normal.  After being removed we passed dilators, 15 and 20 Savary dilators with very little in the way of resistance.  With the latter, the guidewire was removed. The endoscope was reinserted.  There was no bleeding.  No abnormalities noted. The endoscope was withdrawn.  The patients vital signs and pulse  oximeter remained stable.  The patient tolerated the procedure without apparent complications.  FINDINGS:  Dilation of the esophagus to 15 and 20 Savary dilators.  PLAN:  Await clinical response.  The patient will follow up with me as an outpatient.  Further studies dependent on patients response. Dictated by:   Sabino Gasser, M.D. Attending Physician:  Sabino Gasser DD:  08/27/01 TD:  08/28/01 Job: [id number]

## 2011-03-08 NOTE — Op Note (Signed)
NAMEEDWARDINE, DESCHEPPER                 ACCOUNT NO.:  [id number]   MEDICAL RECORD NO.:  [id number]          PATIENT TYPE:  INP   LOCATION:  3311                         FACILITY:  [facility]   PHYSICIAN:  Salvatore Decent. Dorris Fetch, M.D.DATE OF BIRTH:  [birth year]   DATE OF PROCEDURE:  08/18/2006  DATE OF DISCHARGE:                                 OPERATIVE REPORT   PREOPERATIVE DIAGNOSIS:  Right spontaneous pneumothorax with ongoing air  leak.   POSTOPERATIVE DIAGNOSIS:  Right spontaneous pneumothorax with ongoing air  leak.   PROCEDURE:  Right video-assisted thoracoscopy, blebectomy from the lateral  and apical right upper lobe and pleural abrasion.   SURGEON:  Salvatore Decent. Dorris Fetch, M.D.   ASSISTANT:  Coral Ceo, P.A.   ANESTHESIA:  General.   FINDINGS:  Extensive bleb formation on the lateral and apical aspect of the  right upper lobe.  No other blebs identified.   CLINICAL NOTE:  Ms. Blakley is a 55 year old female who presented with a right  spontaneous pneumothorax.  A chest tube was placed but the patient had a  persistent air leak.  She was advised to undergo a right VATS blebectomy and  pleural abrasion for treatment of the spontaneous pneumothorax.  She  previously had a CT scan done in July, follow-up of a previous contralateral  lung cancer which showed no evidence of disease but did show extensive bleb  formation in the right upper lobe.  The indications, risks, benefits and  alternatives for the procedure were discussed in detail with Ms. Maenza.  She  understood and accepted the risks and agreed to proceed.   OPERATIVE NOTE:  Ms. Wisdom was brought the preop holding area on 08/18/2006.  There the anesthesia service placed, central venous access.  Arterial blood  pressure monitoring catheter was placed.  Intravenous antibiotics were  administered.  PAS hose were placed for DVT prophylaxis.  She is taken to  the operating room, anesthetized and intubated with a double-lumen  endotracheal tube.  A Foley catheter was placed.  She was placed in the left  lateral decubitus position and the right chest was prepped and draped in  usual fashion.  The existing chest tube was removed prior to prepping the  chest.  Single lung ventilation of the left lung was carried out.  The  patient tolerated that well throughout the procedure.   Incision made in the mid axillary line in the eighth intercostal space, was  carried through the skin and subcutaneous tissue.  The chest entered bluntly  using hemostat.  The port was inserted and a thoracoscope was inserted into  the chest.  There was extensive adhesions inferiorly, there were no mass  lesions noted.  Additional incisions were made posterior just below the tip  of the scapula and the anterior axillary line.  An attempt was made to take  down the adhesions inferiorly with electrocautery.  This was a very friable  and a very bloody tissue.  Therefore an Echelon stapler was used to divide  the adhesions.  There were additional small adhesions at the apex of the  chest.  These were taken down with electrocautery.  There were also  adhesions high on the apex which were taken down with electrocautery. There  was large bleb on the lateral aspect of the lung.  Then there was a complex  of blebs on the more anterior lateral aspect of the lung which ran centrally  from the inferior aspect of the right upper lobe all the way up to the apex.  There was a separate apical bleb medially. The lateral bleb was removed  first.  A single firing of Ash 160 stapler was performed.  The specimen was  removed and sent for permanent pathology.  Next the large complex blebs was  removed with multiple firings of the Echelon 60 stapler taking care to  remove the entire bleb complex with a very small rim of normal lung tissue.  Finally the remaining medial apical bleb was removed in the same fashion.  Of note, the large complex of blebs was removed in  an endoscopic bag to  avoid having to enlarge the patient's incision.  The staple lines were  meticulously inspected for hemostasis.  The lung was meticulously inspected  for additional blebs none of which were identified.  A piece of gauze then  was used to lightly abrade the pleural surface, removed adhesion formation.  A 28-French chest tube was placed through the anterior incision and secured  with two #1 silk sutures and was placed to suction and the right lung was  reinflated.  The remaining two port sites were closed in standard fashion.  Subcuticular closure used to skin.  All sponge, needle and instrument counts  were correct at the end of the procedure.  The patient was taken from the  operating room to the postanesthetic care unit extubated and in stable  condition.           ______________________________  Salvatore Decent Dorris Fetch, M.D.     SCH/MEDQ  D:  08/18/2006  T:  08/19/2006  Job:  [id number]

## 2011-03-08 NOTE — Procedures (Signed)
[facility]  Patient:    [name]                         MRN: [id number] Proc. Date: 10/25/99 Adm. Date:  [id number] Attending:  Cameron Proud CC:         Anesthesia Dept.                           Procedure Report  PROCEDURE:  Insertion of epidural catheter for postoperative pain management.  INDICATION:  Ms. Cua was brought to the operating room today by Dr. Algis Downs. Karle Plumber for thoracotomy with biopsy of a lung lesion.  Prior to the operation, I  spoke with her concerning the risks and benefits of epidural analgesia.  She understood these to be infection, subarachnoid or intravenous injection, and nerve damage.  She wished to have this performed as necessary.  DESCRIPTION OF PROCEDURE:  At conclusion of the operation, she was maintained in the right lateral position.  Her back was prepped in a sterile fashion.  At the  T8/9 interspace, a 17 gauge Tuohy needle was passed to a loss of resistance to normal saline 3 cc.  There was a negative aspiration of blood or CSF and an 18 gauge Burron catheter was passed through the needle to a depth of 4 cm below the needle.  There was a negative aspiration of blood or CSF through the catheter and a bolus dose of 10 cc of 0.5% lidocaine was given.  The patient was turned supine, suctioned, and extubated in the operating room. She was taken to the PACU in good condition.  While there, she will be started on an infusion of fentanyl at a concentration of 5 mcg per cc of 1/16% bupivacaine. he will be followed by the anesthesia department for several days while the infusion continues. DD:  10/25/99 TD:  10/26/99 Job: [id number]

## 2011-03-08 NOTE — Op Note (Signed)
[name], [name]                 ACCOUNT NO.:  [id number]   MEDICAL RECORD NO.:  [id number]          PATIENT TYPE:  AMB   LOCATION:  [facility]                         FACILITY:  [facility]   PHYSICIAN:  Excell Seltzer. Annabell Howells, M.D.    DATE OF BIRTH:  May 03, 1956   DATE OF PROCEDURE:  04/02/2005  DATE OF DISCHARGE:                                 OPERATIVE REPORT   OPERATION/PROCEDURE:  Cystoscopy and urethral dilation.   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   POSTOPERATIVE DIAGNOSIS:  Urethral stenosis/stricture.   SURGEON:  Excell Seltzer. Annabell Howells, M.D.   ANESTHESIA:  General.   COMPLICATIONS:  None.   INDICATIONS:  Mrs. Desaulniers is a 55 year old black female who was found to have  microhematuria.  As part of her evaluation, cystoscopy was attempted and I  was unable to pass the scope due to urethral stenosis.  She does have a left  renal stone and the scan demonstrated a possible small bladder stone so it  was felt that removal of the bladder stone to be indicated if it were indeed  found to be in the bladder.   FINDINGS AND PROCEDURE:  The patient was given p.o. Cipro.  She was taken to  the operating room where general anesthesia was induced.  She was placed in  the lithotomy position.  Her perineum and genitalia were prepped with  Betadine solution and she was draped in the usual sterile fashion.  Her  urethra was calibrated with female sounds from 20-French to 32-French.  The  first sound was quite tight.  It was less difficult to dilate after the  initial sound passage.  After dilating to 32-French, cystoscopy was  performed over the 12-degree and 70-degree lens and 22-French sheath.  This  examination revealed the disruptive stricture in the mid urethral area with  a little bit of  bloody ooze.  The bladder wall had mild trabeculation.  No tumor, stones or  inflammation were noted.  Ureteral orifices were unremarkable.   IMPRESSION:  Microhematuria with  urolithiasis and urethral stenosis.  There  were no complications.       JJW/MEDQ  D:  04/02/2005  T:  04/02/2005  Job:  [id number]   cc:   Gregary Signs A. Everardo All, M.D. [facility]   Genene Churn. Cyndie Chime, M.D.  [address] [facility]  [place]  [place] [address]  Fax: [phone]

## 2011-04-19 ENCOUNTER — Emergency Department (HOSPITAL_COMMUNITY)
Admission: EM | Admit: 2011-04-19 | Discharge: 2011-04-20 | Disposition: A | Discharge status: Home / Self Care | Payer: Medicare Other | Attending: Emergency Medicine | Admitting: Emergency Medicine

## 2011-04-19 DIAGNOSIS — Z90710 Acquired absence of both cervix and uterus: Secondary | ICD-10-CM | POA: Insufficient documentation

## 2011-04-19 DIAGNOSIS — Z87442 Personal history of urinary calculi: Secondary | ICD-10-CM | POA: Insufficient documentation

## 2011-04-19 DIAGNOSIS — Z85118 Personal history of other malignant neoplasm of bronchus and lung: Secondary | ICD-10-CM | POA: Insufficient documentation

## 2011-04-19 DIAGNOSIS — X58XXXA Exposure to other specified factors, initial encounter: Secondary | ICD-10-CM | POA: Insufficient documentation

## 2011-04-19 DIAGNOSIS — T148XXA Other injury of unspecified body region, initial encounter: Secondary | ICD-10-CM | POA: Insufficient documentation

## 2011-04-19 DIAGNOSIS — R110 Nausea: Secondary | ICD-10-CM | POA: Insufficient documentation

## 2011-04-19 DIAGNOSIS — R109 Unspecified abdominal pain: Secondary | ICD-10-CM | POA: Insufficient documentation

## 2011-04-19 DIAGNOSIS — E119 Type 2 diabetes mellitus without complications: Secondary | ICD-10-CM | POA: Insufficient documentation

## 2011-04-19 LAB — GLUCOSE, CAPILLARY: Glucose-Capillary: 170 mg/dL — ABNORMAL HIGH (ref 70–99)

## 2011-04-20 ENCOUNTER — Emergency Department (HOSPITAL_COMMUNITY): Payer: Medicare Other

## 2011-04-20 LAB — URINALYSIS, ROUTINE W REFLEX MICROSCOPIC
Bilirubin Urine: NEGATIVE
Glucose, UA: NEGATIVE mg/dL
Hgb urine dipstick: NEGATIVE
Ketones, ur: NEGATIVE mg/dL
Leukocytes, UA: NEGATIVE
Nitrite: NEGATIVE
Protein, ur: NEGATIVE mg/dL
Specific Gravity, Urine: 1.025 (ref 1.005–1.030)
Urobilinogen, UA: 0.2 mg/dL (ref 0.0–1.0)
pH: 5 (ref 5.0–8.0)

## 2011-04-20 LAB — DIFFERENTIAL
Basophils Absolute: 0 10*3/uL (ref 0.0–0.1)
Basophils Relative: 1 % (ref 0–1)
Eosinophils Absolute: 0.5 10*3/uL (ref 0.0–0.7)
Eosinophils Relative: 9 % — ABNORMAL HIGH (ref 0–5)
Lymphocytes Relative: 40 % (ref 12–46)
Lymphs Abs: 2.4 10*3/uL (ref 0.7–4.0)
Monocytes Absolute: 0.5 10*3/uL (ref 0.1–1.0)
Monocytes Relative: 8 % (ref 3–12)
Neutro Abs: 2.6 10*3/uL (ref 1.7–7.7)
Neutrophils Relative %: 43 % (ref 43–77)

## 2011-04-20 LAB — POCT I-STAT, CHEM 8
BUN: 17 mg/dL (ref 6–23)
Calcium, Ion: 1.17 mmol/L (ref 1.12–1.32)
Chloride: 108 mEq/L (ref 96–112)
Creatinine, Ser: 0.8 mg/dL (ref 0.50–1.10)
Glucose, Bld: 173 mg/dL — ABNORMAL HIGH (ref 70–99)
HCT: 34 % — ABNORMAL LOW (ref 36.0–46.0)
Hemoglobin: 11.6 g/dL — ABNORMAL LOW (ref 12.0–15.0)
Potassium: 4.1 mEq/L (ref 3.5–5.1)
Sodium: 142 mEq/L (ref 135–145)
TCO2: 24 mmol/L (ref 0–100)

## 2011-04-20 LAB — CBC
HCT: 33.8 % — ABNORMAL LOW (ref 36.0–46.0)
Hemoglobin: 11 g/dL — ABNORMAL LOW (ref 12.0–15.0)
MCH: 30.1 pg (ref 26.0–34.0)
MCHC: 32.5 g/dL (ref 30.0–36.0)
MCV: 92.6 fL (ref 78.0–100.0)
Platelets: 252 10*3/uL (ref 150–400)
RBC: 3.65 MIL/uL — ABNORMAL LOW (ref 3.87–5.11)
RDW: 12.4 % (ref 11.5–15.5)
WBC: 6.2 10*3/uL (ref 4.0–10.5)

## 2011-06-20 ENCOUNTER — Other Ambulatory Visit: Payer: Self-pay | Admitting: Oncology

## 2011-06-20 ENCOUNTER — Encounter (HOSPITAL_BASED_OUTPATIENT_CLINIC_OR_DEPARTMENT_OTHER): Payer: Medicare Other | Admitting: Oncology

## 2011-06-20 ENCOUNTER — Ambulatory Visit (HOSPITAL_COMMUNITY)
Admission: RE | Admit: 2011-06-20 | Discharge: 2011-06-20 | Disposition: A | Discharge status: Home / Self Care | Payer: Medicare Other | Source: Ambulatory Visit | Attending: Oncology | Admitting: Oncology

## 2011-06-20 DIAGNOSIS — C3490 Malignant neoplasm of unspecified part of unspecified bronchus or lung: Secondary | ICD-10-CM | POA: Insufficient documentation

## 2011-06-20 DIAGNOSIS — I77819 Aortic ectasia, unspecified site: Secondary | ICD-10-CM | POA: Insufficient documentation

## 2011-06-20 LAB — COMPREHENSIVE METABOLIC PANEL
ALT: 15 U/L (ref 0–35)
AST: 15 U/L (ref 0–37)
Albumin: 4 g/dL (ref 3.5–5.2)
Alkaline Phosphatase: 91 U/L (ref 39–117)
BUN: 12 mg/dL (ref 6–23)
CO2: 25 mEq/L (ref 19–32)
Calcium: 9.1 mg/dL (ref 8.4–10.5)
Chloride: 106 mEq/L (ref 96–112)
Creatinine, Ser: 0.83 mg/dL (ref 0.50–1.10)
Glucose, Bld: 127 mg/dL — ABNORMAL HIGH (ref 70–99)
Potassium: 3.8 mEq/L (ref 3.5–5.3)
Sodium: 140 mEq/L (ref 135–145)
Total Bilirubin: 0.3 mg/dL (ref 0.3–1.2)
Total Protein: 6.8 g/dL (ref 6.0–8.3)

## 2011-06-20 LAB — CBC WITH DIFFERENTIAL/PLATELET
BASO%: 0.2 % (ref 0.0–2.0)
Basophils Absolute: 0 10*3/uL (ref 0.0–0.1)
EOS%: 7.1 % — ABNORMAL HIGH (ref 0.0–7.0)
Eosinophils Absolute: 0.4 10*3/uL (ref 0.0–0.5)
HCT: 34.4 % — ABNORMAL LOW (ref 34.8–46.6)
HGB: 11.7 g/dL (ref 11.6–15.9)
LYMPH%: 28.1 % (ref 14.0–49.7)
MCH: 30.9 pg (ref 25.1–34.0)
MCHC: 34 g/dL (ref 31.5–36.0)
MCV: 90.8 fL (ref 79.5–101.0)
MONO#: 0.4 10*3/uL (ref 0.1–0.9)
MONO%: 6.7 % (ref 0.0–14.0)
NEUT#: 3.4 10*3/uL (ref 1.5–6.5)
NEUT%: 57.9 % (ref 38.4–76.8)
Platelets: 264 10*3/uL (ref 145–400)
RBC: 3.79 10*6/uL (ref 3.70–5.45)
RDW: 12.4 % (ref 11.2–14.5)
WBC: 5.8 10*3/uL (ref 3.9–10.3)
lymph#: 1.6 10*3/uL (ref 0.9–3.3)
nRBC: 0 % (ref 0–0)

## 2011-06-20 LAB — LACTATE DEHYDROGENASE: LDH: 163 U/L (ref 94–250)

## 2011-06-25 ENCOUNTER — Encounter (HOSPITAL_BASED_OUTPATIENT_CLINIC_OR_DEPARTMENT_OTHER): Payer: Medicare Other | Admitting: Oncology

## 2011-06-25 DIAGNOSIS — C3490 Malignant neoplasm of unspecified part of unspecified bronchus or lung: Secondary | ICD-10-CM

## 2011-07-16 LAB — BASIC METABOLIC PANEL
BUN: 15
CO2: 29
Calcium: 9.4
Chloride: 103
Creatinine, Ser: 0.76
GFR calc Af Amer: >60
GFR calc non Af Amer: >60
Glucose, Bld: 165 — ABNORMAL HIGH
Potassium: 4.3
Sodium: 139

## 2011-07-16 LAB — HEMOGLOBIN AND HEMATOCRIT, BLOOD
HCT: 31.4 — ABNORMAL LOW
Hemoglobin: 10.8 — ABNORMAL LOW

## 2011-07-16 LAB — CBC
HCT: 36.6
Hemoglobin: 12.5
MCHC: 34.1
MCV: 92.9
Platelets: 277
RBC: 3.94
RDW: 13
WBC: 5.8

## 2011-07-17 ENCOUNTER — Encounter: Payer: Self-pay | Admitting: Pulmonary Disease

## 2011-07-22 ENCOUNTER — Encounter: Payer: Self-pay | Admitting: Pulmonary Disease

## 2011-07-22 ENCOUNTER — Ambulatory Visit (INDEPENDENT_AMBULATORY_CARE_PROVIDER_SITE_OTHER): Payer: Medicare Other | Admitting: Pulmonary Disease

## 2011-07-22 VITALS — BP 138/70 | HR 67 | Temp 98.0°F | Ht 67.5 in | Wt 249.2 lb

## 2011-07-22 DIAGNOSIS — R0602 Shortness of breath: Secondary | ICD-10-CM

## 2011-07-22 LAB — GLUCOSE, CAPILLARY: Glucose-Capillary: 133 — ABNORMAL HIGH

## 2011-07-22 NOTE — Assessment & Plan Note (Signed)
Restrictive lung disease on PFTs Radiation changes lt lung No evidence of recurrence on last CT - it has been 11 years. She will FU as needed Flu shot every year

## 2011-07-22 NOTE — Progress Notes (Signed)
  Subjective:    Patient ID: [name], female    DOB: 1956-09-23, 55 y.o.   MRN: [id number]  HPI 53/ F, with dyspnea is attributed to restrictive lung disease s/p RUlobectomy for spont pneumothx in 10/07, s/p chemo RT for stg III B lung cancer in 2001 - in remission since!  She has been out of work from 12/09 - still battling workers' comp  Underwent rehab in 2010.   PFTs in 2/08 and spirometry in 9/09  consistent with moderate restrictive lung disease. The ratio was normal, which makes obstruction less likely. Lung volumes were decreased as was diffusion capacity, but this corrected for alveolar volume. Spirometry in 12/10 showed decline in FEV1 from 57 to 40%. , full PFT 1/11 showed FEV1 51% & FVC 47%, DLCO was 47% but corrected for alveolar volume   Dust made her breathing worse, at times, and musty odor is enough to trigger her dyspnea.  labs showed transient elevated creatine kinase 509 and aldolase 12.3 in 10/09, which resolved by 11/09  Bronchodilators did not help, but caused tremors and palpitations.  Stage IIIB, non-small cell lung cancer was diagnosed in 2001, by Dr. Edwyna Shell, and she subsequently underwent chemotherapy and radiation. Her last CT scan from feb'11, has shown stable postoperative changes and nodular scarring in the left lung.  She underwent right upper lobectomy and pleural abrasions for a spontaneous right pneumothorax by Dr. Dorris Fetch in 10/07. The CT also shows postoperative changes in the right lung.  A submucosal lesion  noted in the gastric fundus is followed by  by Dr. Christella Hartigan November 16, 2011did not desaturate on exertion  07/22/2011 Reviewed CT abd 6/11, CT chest 2/11 & CXR 8/11 - no recurrence, scarring , radiation changes lt lung She has done well , not needed rescue inhaler Denies chest pain, orthopnea, hemoptysis, fever, n/v/d, edema,.     Review of Systems Patient denies significant dyspnea,cough, hemoptysis,  chest pain, palpitations, pedal edema,  orthopnea, paroxysmal nocturnal dyspnea, lightheadedness, nausea, vomiting, abdominal or  leg pains      Objective:   Physical Exam Gen. Pleasant, obese, in no distress ENT - no lesions, no post nasal drip Neck: No JVD, no thyromegaly, no carotid bruits Lungs: no use of accessory muscles, no dullness to percussion, decreased without rales or rhonchi  Cardiovascular: Rhythm regular, heart sounds  normal, no murmurs or gallops, no peripheral edema Musculoskeletal: No deformities, no cyanosis or clubbing , no tremors        Assessment & Plan:

## 2011-07-22 NOTE — Patient Instructions (Signed)
Call as needed Take the flu shot

## 2011-07-29 ENCOUNTER — Encounter: Payer: Self-pay | Admitting: Gastroenterology

## 2011-07-29 ENCOUNTER — Ambulatory Visit (INDEPENDENT_AMBULATORY_CARE_PROVIDER_SITE_OTHER): Payer: Medicare Other | Admitting: Gastroenterology

## 2011-07-29 VITALS — BP 108/70 | HR 72 | Ht 67.5 in | Wt 247.0 lb

## 2011-07-29 DIAGNOSIS — R194 Change in bowel habit: Secondary | ICD-10-CM

## 2011-07-29 DIAGNOSIS — R198 Other specified symptoms and signs involving the digestive system and abdomen: Secondary | ICD-10-CM

## 2011-07-29 LAB — DIFFERENTIAL
Basophils Absolute: 0
Basophils Relative: 0
Eosinophils Absolute: 0.4
Eosinophils Relative: 5
Lymphocytes Relative: 18
Lymphs Abs: 1.3
Monocytes Absolute: 0.5
Monocytes Relative: 7
Neutro Abs: 5
Neutrophils Relative %: 70

## 2011-07-29 LAB — URINALYSIS, ROUTINE W REFLEX MICROSCOPIC
Bilirubin Urine: NEGATIVE
Glucose, UA: NEGATIVE
Hgb urine dipstick: NEGATIVE
Ketones, ur: NEGATIVE
Nitrite: NEGATIVE
Protein, ur: NEGATIVE
Specific Gravity, Urine: 1.026
Urobilinogen, UA: 0.2
pH: 5.5

## 2011-07-29 LAB — LIPASE, BLOOD: Lipase: 23

## 2011-07-29 LAB — COMPREHENSIVE METABOLIC PANEL
ALT: 16
AST: 18
Albumin: 3.2 — ABNORMAL LOW
Alkaline Phosphatase: 93
BUN: 19
CO2: 29
Calcium: 9.1
Chloride: 108
Creatinine, Ser: 0.79
GFR calc Af Amer: >60
GFR calc non Af Amer: >60
Glucose, Bld: 142 — ABNORMAL HIGH
Potassium: 4.5
Sodium: 143
Total Bilirubin: 0.6
Total Protein: 6.3

## 2011-07-29 LAB — CBC
HCT: 32.6 — ABNORMAL LOW
Hemoglobin: 11.1 — ABNORMAL LOW
MCHC: 34.2
MCV: 91.8
Platelets: 330
RBC: 3.55 — ABNORMAL LOW
RDW: 13.1
WBC: 7.2

## 2011-07-29 LAB — PREGNANCY, URINE: Preg Test, Ur: NEGATIVE

## 2011-07-29 NOTE — Patient Instructions (Signed)
Please start taking citrucel (orange flavored) powder fiber supplement.  This may cause some bloating at first but that usually goes away. Begin with a small spoonful and work your way up to a large, heaping spoonful daily over a week. Drink extra water. Call Dr. Christella Hartigan' office in 4-5 weeks.

## 2011-07-29 NOTE — Progress Notes (Signed)
Review of gastrointestinal problems:  1. Routine risk for colon cancer; Feb 2009 colonoscopy with dr. Virginia Rochester, no polyps. Next colonoscopy should be 11/2017  2. submucosal gastric lesion, first noted early 2009. Endoscopic ultrasound procedures have been performed, none with firm tissue diagnosis, next Examination at 3 year interval given stability, small size of lesion.  Repeat endoscopic ultrasound 2010, discrete submucosal lesion, 1.8 cm. Repeat evaluation was set for 3 year interval. 3. intermittent dysphagia since radiation, chemotherapy for lung cancer. Barium esophagram 2009 showed decreased peristalsis but no discrete strictures.  HPI: This is a  very pleasant 55 year old woman whom I last saw about 2 years ago.  She was at onoclogists office, told to come here.  She has post prandial BMs.    After eating, she has cramping, gas.  Then has a BM, flatus.  This occurs with only some meals.  She has about one BM a day.  She is not taking laxatives.   This has been a problem for 5-6 months.    Overall stable weight.   She was drinking a lot of pepsi, but cut back after kidney stone.   Now just non-caffinated bevs.  Has tea occasionally during cold weather.  Does not eat out a lot.  Eats potatoes, salads.   Past Medical History:   lung ca                                         dx'd 1/20*     Comment:chemo/xrt comp 05/29/2000   Diabetes mellitus                                            Insomnia                                                     Nephrolithiasis                                              Obesity                                                      Erythema nodosum                                             Aortic insufficiency                                        Past Surgical History:   CHEST SURGERY  Comment:bilateral lung   SHOULDER SURGERY                                             INNER EAR SURGERY                                               Comment:right    ABDOMINAL HYSTERECTOMY                                       reports that she quit smoking about 12 years ago. She has never used smokeless tobacco. She reports that she does not drink alcohol or use illicit drugs.  family history includes Diabetes in her father, mother, and sister; Heart disease in her father; Hypertension in her sister; and Pancreatic cancer in her mother.    Current medicines and allergies were reviewed in [facility]    Physical Exam: BP 108/70  Pulse 72  Ht 5' 7.5" (1.715 m)  Wt 247 lb (112.038 kg)  BMI 38.11 kg/m2 Constitutional: generally well-appearing Psychiatric: alert and oriented x3 Abdomen: soft, nontender, nondistended, no obvious ascites, no peritoneal signs, normal bowel sounds     Assessment and plan: 55 y.o. female with abnormal bowel habits  She had a colonoscopy about 3 years ago and I do not think that needs to be repeated now. She really is describing alternating constipation, loose stools. She will sometimes spends 20-30 minutes in the bathroom. Sometimes she has a very brisk gastrocolic reflex. I like to start simple here, she will begin a fiber supplement on a daily basis and will call my office in 4-5 weeks to report on her symptoms.

## 2011-08-13 ENCOUNTER — Ambulatory Visit
Admission: RE | Admit: 2011-08-13 | Discharge: 2011-08-13 | Disposition: A | Discharge status: Home / Self Care | Payer: BC Managed Care – PPO | Source: Ambulatory Visit | Attending: Oncology | Admitting: Oncology

## 2011-08-13 DIAGNOSIS — Z1231 Encounter for screening mammogram for malignant neoplasm of breast: Secondary | ICD-10-CM

## 2011-09-23 ENCOUNTER — Encounter: Payer: Self-pay | Admitting: Gastroenterology

## 2011-09-23 ENCOUNTER — Ambulatory Visit (INDEPENDENT_AMBULATORY_CARE_PROVIDER_SITE_OTHER): Payer: Medicare Other | Admitting: Gastroenterology

## 2011-09-23 VITALS — BP 102/70 | HR 100 | Ht 67.5 in | Wt 245.8 lb

## 2011-09-23 DIAGNOSIS — R197 Diarrhea, unspecified: Secondary | ICD-10-CM

## 2011-09-23 MED ORDER — PEG-KCL-NACL-NASULF-NA ASC-C 100 G PO SOLR: 1.0000 | ORAL | Status: DC

## 2011-09-23 NOTE — Progress Notes (Signed)
Review of gastrointestinal problems:  1. Routine risk for colon cancer; Feb 2009 colonoscopy with dr. Virginia Rochester, no polyps. Next colonoscopy should be 11/2017  2. submucosal gastric lesion, first noted early 2009. Endoscopic ultrasound procedures have been performed, none with firm tissue diagnosis, next Examination at 3 year interval given stability, small size of lesion. Repeat endoscopic ultrasound 2010, discrete submucosal lesion, 1.8 cm. Repeat evaluation was set for 3 year interval.  3. intermittent dysphagia since radiation, chemotherapy for lung cancer. Barium esophagram 2009 showed decreased peristalsis but no discrete strictures.   HPI: This is a  very pleasant 55 year old woman whom I last saw about 2 months ago.  She started daily fiber supplements. This helped at first, decreased the urgency.  But not by much.  Eating causes her to have very loose stools, any foods.  She has usually NO BM if she doesn't eat.  ANy kinds of food.    Has had this issue for 1-2 years with her bowels.  Drinks no caffeine, no etoh, no tob.  Overall stable weight.  No rectal bleeding.    Past Medical History  Diagnosis Date  . lung ca dx'd 10/1999    chemo/xrt comp 05/29/2000  . Diabetes mellitus   . Insomnia   . Nephrolithiasis   . Obesity   . Erythema nodosum   . Aortic insufficiency     Past Surgical History  Procedure Date  . Chest surgery     bilateral lung  . Shoulder surgery   . Inner ear surgery     right   . Abdominal hysterectomy     Current Outpatient Prescriptions  Medication Sig Dispense Refill  . Calcium Carbonate-Vitamin D (CALCIUM-VITAMIN D3 PO) Take 1 tablet by mouth daily.        [name] glimepiride (AMARYL) 2 MG tablet Take 2 mg by mouth daily.        . metFORMIN (GLUCOPHAGE) 500 MG tablet Take 1,000 mg by mouth 2 (two) times daily with a meal.        . sitaGLIPtin (JANUVIA) 100 MG tablet Take 100 mg by mouth daily.        . sodium fluoride-calcium carbonate (FLORICAL) 8.3-364  MG CAPS Take 1 capsule by mouth daily.          Allergies as of 09/23/2011 - Review Complete 09/23/2011  Allergen Reaction Noted  . Hydrocodone      Family History  Problem Relation Age of Onset  . Heart disease Father   . Pancreatic cancer Mother   . Diabetes Father   . Diabetes Mother   . Hypertension Sister     x 3  . Diabetes Sister     x 4    History   Social History  . Marital Status: Single    Spouse Name: N/A    Number of Children: 0  . Years of Education: N/A   Occupational History  . custodian    Social History Main Topics  . Smoking status: Former Smoker    Quit date: 07/22/1999  . Smokeless tobacco: Never Used  . Alcohol Use: No  . Drug Use: No  . Sexually Active: Not on file   Other Topics Concern  . Not on file   Social History Narrative  . No narrative on file      Physical Exam: BP 102/70  Pulse 100  Ht 5' 7.5" (1.715 m)  Wt 245 lb 12.8 oz (111.494 kg)  BMI 37.93 kg/m2 Constitutional: generally well-appearing Psychiatric: alert and  oriented x3 Abdomen: soft, nontender, nondistended, no obvious ascites, no peritoneal signs, normal bowel sounds     Assessment and plan: 55 y.o. female with change in her bowel habits, urgency, postprandial loose stools  The patient proceed with full colonoscopy at her soonest convenience. Her last colonoscopy was February 2009, almost 4 years ago. Her bowel changes started since then. Likely she has irritable bowel but  I would like to rule out inflammation as well.

## 2011-09-23 NOTE — Patient Instructions (Signed)
You will be set up for a colonoscopy.  

## 2011-10-01 ENCOUNTER — Telehealth: Payer: Self-pay | Admitting: Gastroenterology

## 2011-10-01 NOTE — Telephone Encounter (Signed)
Pt was told that her Movi prep was considered the best and she agreed to get the movi prep.

## 2011-10-04 ENCOUNTER — Ambulatory Visit (AMBULATORY_SURGERY_CENTER): Payer: Medicare Other | Admitting: Gastroenterology

## 2011-10-04 ENCOUNTER — Encounter: Payer: Self-pay | Admitting: Gastroenterology

## 2011-10-04 VITALS — BP 136/79 | HR 84 | Temp 97.9°F | Resp 16 | Ht 67.0 in | Wt 245.0 lb

## 2011-10-04 DIAGNOSIS — D126 Benign neoplasm of colon, unspecified: Secondary | ICD-10-CM

## 2011-10-04 DIAGNOSIS — K5730 Diverticulosis of large intestine without perforation or abscess without bleeding: Secondary | ICD-10-CM

## 2011-10-04 DIAGNOSIS — R197 Diarrhea, unspecified: Secondary | ICD-10-CM

## 2011-10-04 LAB — GLUCOSE, CAPILLARY
Glucose-Capillary: 169 mg/dL — ABNORMAL HIGH (ref 70–99)
Glucose-Capillary: 155 mg/dL — ABNORMAL HIGH (ref 70–99)

## 2011-10-04 MED ORDER — SODIUM CHLORIDE 0.9 % IV SOLN: 500.0000 mL | INTRAVENOUS | Status: DC

## 2011-10-04 NOTE — Patient Instructions (Signed)
Please follow all discharge instructions given to you by the recovery room nurse. If you have any questions or problems after discharge please call [phone]. You will receive a phone call in the am to see how you are doing and answer any questions you may have. Thank you for choosing [facility] for your health care needs.  

## 2011-10-04 NOTE — Progress Notes (Signed)
Patient did not experience any of the following events: a burn prior to discharge; a fall within the facility; wrong site/side/patient/procedure/implant event; or a hospital transfer or hospital admission upon discharge from the facility. (G8907) Patient did not have preoperative order for IV antibiotic SSI prophylaxis. (G8918)  

## 2011-10-04 NOTE — Op Note (Signed)
[facility] [address] [place], [place]  [address]  COLONOSCOPY PROCEDURE REPORT  PATIENT:  [name], [name]  MR#:  [id number] BIRTHDATE:  [birth year], 55 yrs. old  GENDER:  female ENDOSCOPIST:  Rachael Fee, MD REF. BY:  Dorisann Frames, M.D. PROCEDURE DATE:  10/04/2011 PROCEDURE:  Colonoscopy with biopsy ASA CLASS:  Class II INDICATIONS:  loose stools, change in bowels MEDICATIONS:   Fentanyl 50 mcg IV, These medications were titrated to patient response per physician's verbal order, Versed 7 mg IV  DESCRIPTION OF PROCEDURE:   After the risks benefits and alternatives of the procedure were thoroughly explained, informed consent was obtained.  Digital rectal exam was performed and revealed no rectal masses.   The LB [id number]-[id number] endoscope was introduced through the anus and advanced to the cecum, which was identified by both the appendix and ileocecal valve, without limitations.  The quality of the prep was good..  The instrument was then slowly withdrawn as the colon was fully examined. <<PROCEDUREIMAGES>> FINDINGS:  Mild diverticulosis was found in the sigmoid to descending colon segments (see image2).  This was otherwise a normal examination of the colon. The colon was randomly biopsied and sent to pathology (jar 1) (see image4, image5, and image6). Retroflexed views in the rectum revealed no abnormalities. COMPLICATIONS:  None  ENDOSCOPIC IMPRESSION: 1) Mild diverticulosis in the sigmoid to descending colon segments 2) Otherwise normal examination; colon was randomly biopsied to check for microscopic colitis.  RECOMMENDATIONS: Await final pathology results.  ______________________________ Rachael Fee, MD  n. eSIGNED:   Rachael Fee at 10/04/2011 10:24 AM  [name], [phone]

## 2011-10-07 ENCOUNTER — Telehealth: Payer: Self-pay | Admitting: *Deleted

## 2011-10-07 NOTE — Telephone Encounter (Signed)

## 2011-10-22 HISTORY — PX: CYSTOSCOPY W/ STONE MANIPULATION: SHX1427

## 2011-11-02 ENCOUNTER — Telehealth: Payer: Self-pay | Admitting: Oncology

## 2011-11-02 NOTE — Telephone Encounter (Signed)
S/w the pt and she is aware of her march lab,ct scan and April md appt

## 2011-11-09 ENCOUNTER — Encounter: Payer: Self-pay | Admitting: *Deleted

## 2011-11-09 DIAGNOSIS — N200 Calculus of kidney: Secondary | ICD-10-CM | POA: Insufficient documentation

## 2011-11-09 DIAGNOSIS — E669 Obesity, unspecified: Secondary | ICD-10-CM | POA: Insufficient documentation

## 2011-11-09 DIAGNOSIS — D649 Anemia, unspecified: Secondary | ICD-10-CM | POA: Insufficient documentation

## 2011-11-09 DIAGNOSIS — J449 Chronic obstructive pulmonary disease, unspecified: Secondary | ICD-10-CM | POA: Insufficient documentation

## 2011-11-09 DIAGNOSIS — G4700 Insomnia, unspecified: Secondary | ICD-10-CM | POA: Insufficient documentation

## 2011-11-11 ENCOUNTER — Encounter: Payer: Self-pay | Admitting: *Deleted

## 2011-12-03 ENCOUNTER — Ambulatory Visit: Payer: Self-pay | Admitting: Emergency Medicine

## 2012-01-08 ENCOUNTER — Telehealth: Payer: Self-pay | Admitting: Internal Medicine

## 2012-01-08 DIAGNOSIS — R011 Cardiac murmur, unspecified: Secondary | ICD-10-CM

## 2012-01-08 NOTE — Telephone Encounter (Signed)
New msg Pt wants to talk you about her heart murmur. Please call

## 2012-01-08 NOTE — Telephone Encounter (Signed)
Called patient back. She states that she was to have a follow up appointment and an echo with Dr.Ross but it was never scheduled. Looking in the records she was due in May of 2012 for a repeat echo for heart murmur and OV. She is complaining of some SOB with exertion. Advised her to let her lung doctor know about this. Will schedule her for a repeat echo and OV. Order sent to [facility].

## 2012-01-14 ENCOUNTER — Other Ambulatory Visit (HOSPITAL_BASED_OUTPATIENT_CLINIC_OR_DEPARTMENT_OTHER): Payer: Medicare Other | Admitting: Lab

## 2012-01-14 ENCOUNTER — Telehealth: Payer: Self-pay | Admitting: *Deleted

## 2012-01-14 ENCOUNTER — Ambulatory Visit (HOSPITAL_COMMUNITY): Payer: Medicare Other | Attending: Cardiology

## 2012-01-14 ENCOUNTER — Other Ambulatory Visit: Payer: Self-pay

## 2012-01-14 ENCOUNTER — Ambulatory Visit (HOSPITAL_COMMUNITY)
Admission: RE | Admit: 2012-01-14 | Discharge: 2012-01-14 | Disposition: A | Discharge status: Home / Self Care | Payer: Medicare Other | Source: Ambulatory Visit | Attending: Oncology | Admitting: Oncology

## 2012-01-14 DIAGNOSIS — C3490 Malignant neoplasm of unspecified part of unspecified bronchus or lung: Secondary | ICD-10-CM

## 2012-01-14 DIAGNOSIS — J984 Other disorders of lung: Secondary | ICD-10-CM | POA: Insufficient documentation

## 2012-01-14 DIAGNOSIS — I359 Nonrheumatic aortic valve disorder, unspecified: Secondary | ICD-10-CM | POA: Insufficient documentation

## 2012-01-14 DIAGNOSIS — R011 Cardiac murmur, unspecified: Secondary | ICD-10-CM

## 2012-01-14 DIAGNOSIS — Z87891 Personal history of nicotine dependence: Secondary | ICD-10-CM | POA: Insufficient documentation

## 2012-01-14 DIAGNOSIS — E119 Type 2 diabetes mellitus without complications: Secondary | ICD-10-CM | POA: Insufficient documentation

## 2012-01-14 LAB — CBC WITH DIFFERENTIAL/PLATELET
BASO%: 0.2 % (ref 0.0–2.0)
Basophils Absolute: 0 10*3/uL (ref 0.0–0.1)
EOS%: 9.8 % — ABNORMAL HIGH (ref 0.0–7.0)
Eosinophils Absolute: 0.6 10*3/uL — ABNORMAL HIGH (ref 0.0–0.5)
HCT: 35.6 % (ref 34.8–46.6)
HGB: 11.8 g/dL (ref 11.6–15.9)
LYMPH%: 32.1 % (ref 14.0–49.7)
MCH: 30.4 pg (ref 25.1–34.0)
MCHC: 33.1 g/dL (ref 31.5–36.0)
MCV: 91.8 fL (ref 79.5–101.0)
MONO#: 0.4 10*3/uL (ref 0.1–0.9)
MONO%: 6.9 % (ref 0.0–14.0)
NEUT#: 2.9 10*3/uL (ref 1.5–6.5)
NEUT%: 51 % (ref 38.4–76.8)
Platelets: 265 10*3/uL (ref 145–400)
RBC: 3.88 10*6/uL (ref 3.70–5.45)
RDW: 12.2 % (ref 11.2–14.5)
WBC: 5.6 10*3/uL (ref 3.9–10.3)
lymph#: 1.8 10*3/uL (ref 0.9–3.3)

## 2012-01-14 LAB — CMP (CANCER CENTER ONLY)
ALT(SGPT): 22 U/L (ref 10–47)
AST: 20 U/L (ref 11–38)
Albumin: 3.4 g/dL (ref 3.3–5.5)
Alkaline Phosphatase: 94 U/L — ABNORMAL HIGH (ref 26–84)
BUN, Bld: 17 mg/dL (ref 7–22)
CO2: 29 mEq/L (ref 18–33)
Calcium: 8.8 mg/dL (ref 8.0–10.3)
Chloride: 101 mEq/L (ref 98–108)
Creat: 0.7 mg/dl (ref 0.6–1.2)
Glucose, Bld: 156 mg/dL — ABNORMAL HIGH (ref 73–118)
Potassium: 4.4 mEq/L (ref 3.3–4.7)
Sodium: 141 mEq/L (ref 128–145)
Total Bilirubin: 0.5 mg/dl (ref 0.20–1.60)
Total Protein: 7 g/dL (ref 6.4–8.1)

## 2012-01-14 LAB — LACTATE DEHYDROGENASE: LDH: 160 U/L (ref 94–250)

## 2012-01-14 MED ORDER — IOHEXOL 300 MG/ML  SOLN
100.0000 mL | Freq: Once | INTRAMUSCULAR | Status: AC | PRN
  Administered 2012-01-14: 100 mL via INTRAVENOUS

## 2012-01-14 NOTE — Telephone Encounter (Signed)
Pt notified of CT results per Dr Granfortuna.  

## 2012-01-14 NOTE — Telephone Encounter (Signed)
Message copied by Sabino Snipes on Tue Jan 14, 2012  4:57 PM ------      Message from: Levert Feinstein      Created: Tue Jan 14, 2012  4:05 PM       Call pt CT  No sign of recurrent lung ca

## 2012-01-20 ENCOUNTER — Telehealth: Payer: Self-pay | Admitting: Oncology

## 2012-01-20 ENCOUNTER — Encounter: Payer: Self-pay | Admitting: Oncology

## 2012-01-20 ENCOUNTER — Ambulatory Visit (HOSPITAL_BASED_OUTPATIENT_CLINIC_OR_DEPARTMENT_OTHER): Payer: Medicare Other | Admitting: Oncology

## 2012-01-20 VITALS — BP 148/86 | HR 77 | Temp 97.1°F | Ht 67.5 in | Wt 250.6 lb

## 2012-01-20 DIAGNOSIS — E119 Type 2 diabetes mellitus without complications: Secondary | ICD-10-CM

## 2012-01-20 DIAGNOSIS — J930 Spontaneous tension pneumothorax: Secondary | ICD-10-CM

## 2012-01-20 DIAGNOSIS — Z85118 Personal history of other malignant neoplasm of bronchus and lung: Secondary | ICD-10-CM

## 2012-01-20 DIAGNOSIS — C3400 Malignant neoplasm of unspecified main bronchus: Secondary | ICD-10-CM | POA: Insufficient documentation

## 2012-01-20 DIAGNOSIS — J8410 Pulmonary fibrosis, unspecified: Secondary | ICD-10-CM

## 2012-01-20 HISTORY — DX: Pulmonary fibrosis, unspecified: J84.10

## 2012-01-20 HISTORY — DX: Spontaneous tension pneumothorax: J93.0

## 2012-01-20 HISTORY — DX: Malignant neoplasm of unspecified main bronchus: C34.00

## 2012-01-20 NOTE — Progress Notes (Signed)
Hematology and Oncology Follow Up Visit  [name] [id number] 30-Jan-1956 56 y.o. 01/20/2012 2:41 PM   Principle Diagnosis: Encounter Diagnoses  Name Primary?  . Cancer of hilus of lung Yes  . Carcinoma of hilus of lung      Interim History:   [name] is now out over 12 years from diagnosis of stage IIIB non-small cell lung cancer presenting i January, 2001 as dysphagia due to a mediastinal lymph node that was compressing her esophagus.  She underwent  initial surgical resection followed by radiation and chemotherapy.  She has had no evidence for recurrence through most recent scan done 01/14/12.   She has developed progressive dyspnea and exercise intolerance over the years, likely due to combined long term effects of the radiation and surgery.  She is now seeing one of the pulmonary critical care physicians, Dr. Vassie Loll.  She has had a declining FEV1, last checked one year ago down to 40%, DLCO 47%.  At time of a visit on September 06, 2010, Advair discus 250/50 mcg, one puff b.i.d. p.r.n. and ProAir HFA albuterol inhaler, one to two puffs as needed added to her medication regimen.  She has an intermittent chronic dry cough.  She has had no interim medical problems. She pulled a muscle left side but pain from this already subsiding significantly.  Medications: reviewed  Allergies:  Allergies  Allergen Reactions  . Hydrocodone   . Morphine And Related Other (See Comments)    GI PROBLEMS  . Nsaids Other (See Comments)    GI PROBLEMS    Review of Systems: Constitutional:  No constitutional symptoms  Respiratory: Dyspnea on exertion but not at rest; chronic dry cough Cardiovascular: No chest pain or palpitations  Gastrointestinal: Still with intermittent dysphagia Genito-Urinary: No urinary tract symptoms Musculoskeletal: See above Neurologic: No headache or change in vision Skin: No rash Remaining ROS negative.  Physical Exam: Blood pressure 148/86, pulse 77, temperature 97.1 F  (36.2 C), temperature source Oral, height 5' 7.5" (1.715 m), weight 250 lb 9.6 oz (113.671 kg). Wt Readings from Last 3 Encounters:  01/20/12 250 lb 9.6 oz (113.671 kg)  10/04/11 245 lb (111.131 kg)  09/23/11 245 lb 12.8 oz (111.494 kg)     General appearance: Well-nourished African American woman HENNT: Pharynx no erythema or exudate Lymph nodes: No adenopathy Breasts: Not examined Lungs: Clear to auscultation resonant to percussion Heart: Regular rhythm no murmur Abdomen: Soft nontender Extremities: No edema no calf tenderness Vascular: No cyanosis Neurologic: Motor strength 5 over 5, reflexes 1+ symmetric Skin: No rash or ecchymosis  Lab Results: Lab Results  Component Value Date   WBC 5.6 01/14/2012   HGB 11.8 01/14/2012   HCT 35.6 01/14/2012   MCV 91.8 01/14/2012   PLT 265 01/14/2012     Chemistry      Component Value Date/Time   NA 141 01/14/2012 0900   NA 140 06/20/2011 1337   NA 140 06/20/2011 1337   K 4.4 01/14/2012 0900   K 3.8 06/20/2011 1337   K 3.8 06/20/2011 1337   CL 101 01/14/2012 0900   CL 106 06/20/2011 1337   CL 106 06/20/2011 1337   CO2 29 01/14/2012 0900   CO2 25 06/20/2011 1337   CO2 25 06/20/2011 1337   BUN 17 01/14/2012 0900   BUN 12 06/20/2011 1337   BUN 12 06/20/2011 1337   CREATININE 0.7 01/14/2012 0900   CREATININE 0.83 06/20/2011 1337   CREATININE 0.83 06/20/2011 1337  Component Value Date/Time   CALCIUM 8.8 01/14/2012 0900   CALCIUM 9.1 06/20/2011 1337   CALCIUM 9.1 06/20/2011 1337   ALKPHOS 94* 01/14/2012 0900   ALKPHOS 91 06/20/2011 1337   ALKPHOS 91 06/20/2011 1337   AST 20 01/14/2012 0900   AST 15 06/20/2011 1337   AST 15 06/20/2011 1337   ALT 15 06/20/2011 1337   ALT 15 06/20/2011 1337   BILITOT 0.50 01/14/2012 0900   BILITOT 0.3 06/20/2011 1337   BILITOT 0.3 06/20/2011 1337       Radiological Studies: CT scan of the chest 01/14/2012 with postsurgical and postradiation changes but no new disease.   Impression: 1. Stage IIIB lung cancer  treated as outlined above.  She remains free of any obvious new disease,   and now out over 12  years.  2. 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  3. Type 2 diabetes. 4. Two centimeter proximal gastric submucosal lesion first noted March 2009. Biopsies by    endoscopy negative. Stable at time of follow-up endoscopy July 14, 2008.  No longer   noted as an abnormality noted on CT scans. 5.         History of spontaneous right pneumothorax October 2010 6.        Diverticulosis.     CC:. Dr. Elgie Congo; Cyril Mourning; Rob Bunting   Levert Feinstein, MD 4/1/20132:41 PM

## 2012-01-20 NOTE — Telephone Encounter (Signed)
appts mader and printed for pt aom

## 2012-03-13 ENCOUNTER — Ambulatory Visit (INDEPENDENT_AMBULATORY_CARE_PROVIDER_SITE_OTHER): Payer: Medicare Other | Admitting: Internal Medicine

## 2012-03-13 ENCOUNTER — Encounter: Payer: Self-pay | Admitting: Internal Medicine

## 2012-03-13 VITALS — BP 112/67 | HR 72 | Ht 67.0 in | Wt 246.0 lb

## 2012-03-13 DIAGNOSIS — R0602 Shortness of breath: Secondary | ICD-10-CM

## 2012-03-13 DIAGNOSIS — I359 Nonrheumatic aortic valve disorder, unspecified: Secondary | ICD-10-CM

## 2012-03-13 DIAGNOSIS — E7800 Pure hypercholesterolemia, unspecified: Secondary | ICD-10-CM

## 2012-03-13 LAB — LIPID PANEL
Cholesterol: 135 mg/dL (ref 0–200)
HDL: 44.7 mg/dL (ref >39.00)
LDL Cholesterol: 73 mg/dL (ref 0–99)
Total CHOL/HDL Ratio: 3
Triglycerides: 86 mg/dL (ref 0.0–149.0)
VLDL: 17.2 mg/dL (ref 0.0–40.0)

## 2012-03-13 NOTE — Patient Instructions (Addendum)
Lab work today We will call you with results.  Your physician has requested that you have a lexiscan myoview. For further information please visit [email or web address] Please follow instruction sheet, as given.  Your physician wants you to follow-up in: 1 year  You will receive a reminder letter in the mail two months in advance. If you don't receive a letter, please call our office to schedule the follow-up appointment.

## 2012-03-13 NOTE — Progress Notes (Addendum)
HPI [name] is a 56 year old with  mild to moderate aortic insufficiency and normal LVF by echo    She has a h/o lung CA in 2001 s/p chemotherapy and radiation therapy.  She is chronically followed by Dr. Vassie Loll for pulmonary.  She has chronic dyspnea from restrictive lung disease.  She had a spontaneous pneumothorax in 2007.  She has a history of chronic SOB  Attrib to lung disease.  She also had an episode of sharp CP without activity.  While getting dressed will feel heart fluttering.   Other times when sitting will have sharp pain.    Allergies  Allergen Reactions  . Hydrocodone   . Morphine And Related Other (See Comments)    GI PROBLEMS  . Nsaids Other (See Comments)    GI PROBLEMS    Current Outpatient Prescriptions  Medication Sig Dispense Refill  . glimepiride (AMARYL) 2 MG tablet Take 2 mg by mouth daily.        . sitaGLIPtin (JANUVIA) 100 MG tablet Take 100 mg by mouth daily.          Past Medical History  Diagnosis Date  . lung ca dx'd 10/1999    chemo/xrt comp 05/29/2000  . Insomnia   . Nephrolithiasis   . Obesity   . Erythema nodosum   . Aortic insufficiency   . Diabetes mellitus   . Allergy   . Anemia   . COPD (chronic obstructive pulmonary disease)   . Heart murmur   . Myalgia   . Kidney stones   . Aortic regurgitation   . 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 Pulmonary fibrosis 01/20/2012    Due to previous surgery and chest radiation for lung cancer  . Pneumothorax, spontaneous, tension 01/20/2012    October, 2010    Past Surgical History  Procedure Date  . Chest surgery     bilateral lung  . Shoulder surgery   . Inner ear surgery     right   . Abdominal hysterectomy   . Colonoscopy   . Upper gastrointestinal endoscopy   . Total knee arthroplasty     RIGHT  . Carpal tunnel release     RIGHT  . Pulmonary embolism surgery 2007   LUNG COLLAPSE  . Esophageal dilation   . Knee operation     LEFT LATERAL RELEASE    Family History  Problem Relation Age of Onset  . Heart disease Father   . Diabetes Father   . Pancreatic cancer Mother   . Diabetes Mother   . Hypertension Sister     x 3  . Diabetes Sister     x 4    History   Social History  . Marital Status: Single    Spouse Name: N/A    Number of Children: 0  . Years of Education: N/A   Occupational History  . custodian    Social History Main Topics  . Smoking status: Former Smoker    Quit date: 07/22/1999  . Smokeless tobacco: Never Used  . Alcohol Use: No  . Drug Use: No  . Sexually Active: Not on file   Other Topics Concern  . Not on file   Social History Narrative  . No narrative on file    Review of Systems:  All systems reviewed.  They are negative to the above problem except as previously stated.  Vital Signs: BP 112/67  Pulse 72  Ht 5\' 7"  (1.702 m)  Wt 246 lb (111.585 kg)  BMI 38.53 kg/m2  Physical Exam  HEENT:  Normocephalic, atraumatic. EOMI, PERRLA.  Neck: JVP is normal. No thyromegaly. No bruits.  Lungs: clear to auscultation. Decreased airflowNo rales no wheezes.  Heart: Regular rate and rhythm. Normal S1, S2. No S3  GrI to  II/VI systolic murmur. [name] PMI not displaced.  Abdomen:  Supple, nontender. Normal bowel sounds. No masses. No hepatomegaly.  Extremities:   Good distal pulses throughout. No lower extremity edema.  Musculoskeletal :moving all extremities.  Neuro:   alert and oriented x3.  CN II-XII grossly intact.  EKG:  SR  72.   Assessment and Plan:  1.  Aortic insufficency.  No diastolic murmurs on exam  AI mild at most.  May be eccentric.  I would follow cliniclay.  2.  SOB.   I think this is most likely related to her chronic lung issues. However cannot r/o that it represent angina equvalent.  Recomm lexiscan myoviw.    3.  CP.  Atypical.  Do not think cardiac 4.  HCM  Will check fasting  lipids  Otherwise f/u in 12 mon.

## 2012-03-24 NOTE — Progress Notes (Signed)
Addended by: Pricilla Riffle on: 03/24/2012 02:28 PM   Modules accepted: Level of Service

## 2012-03-25 ENCOUNTER — Ambulatory Visit (HOSPITAL_COMMUNITY): Payer: Medicare Other | Attending: Cardiovascular Disease | Admitting: Radiology

## 2012-03-25 VITALS — BP 118/72 | Ht 67.0 in | Wt 246.0 lb

## 2012-03-25 DIAGNOSIS — R0602 Shortness of breath: Secondary | ICD-10-CM | POA: Insufficient documentation

## 2012-03-25 DIAGNOSIS — R079 Chest pain, unspecified: Secondary | ICD-10-CM

## 2012-03-25 DIAGNOSIS — R0989 Other specified symptoms and signs involving the circulatory and respiratory systems: Secondary | ICD-10-CM | POA: Insufficient documentation

## 2012-03-25 DIAGNOSIS — R002 Palpitations: Secondary | ICD-10-CM | POA: Insufficient documentation

## 2012-03-25 DIAGNOSIS — R0609 Other forms of dyspnea: Secondary | ICD-10-CM | POA: Insufficient documentation

## 2012-03-25 DIAGNOSIS — Z87891 Personal history of nicotine dependence: Secondary | ICD-10-CM | POA: Insufficient documentation

## 2012-03-25 DIAGNOSIS — R0789 Other chest pain: Secondary | ICD-10-CM | POA: Insufficient documentation

## 2012-03-25 DIAGNOSIS — Z8249 Family history of ischemic heart disease and other diseases of the circulatory system: Secondary | ICD-10-CM | POA: Insufficient documentation

## 2012-03-25 MED ORDER — REGADENOSON 0.4 MG/5ML IV SOLN
0.4000 mg | Freq: Once | INTRAVENOUS | Status: AC
  Administered 2012-03-25: 0.4 mg via INTRAVENOUS

## 2012-03-25 MED ORDER — TECHNETIUM TC 99M TETROFOSMIN IV KIT
10.0000 | PACK | Freq: Once | INTRAVENOUS | Status: AC | PRN
  Administered 2012-03-25: 10 via INTRAVENOUS

## 2012-03-25 MED ORDER — TECHNETIUM TC 99M TETROFOSMIN IV KIT
30.0000 | PACK | Freq: Once | INTRAVENOUS | Status: AC | PRN
  Administered 2012-03-25: 30 via INTRAVENOUS

## 2012-03-25 NOTE — Progress Notes (Signed)
[facility] SITE 3 NUCLEAR MED [address] [place] [address] [phone]  Cardiology Nuclear Med Study  [name] is a 56 y.o. female     MRN : [id number]     DOB: 05-Nov-1955  Procedure Date: 03/25/2012  Nuclear Med Background Indication for Stress Test:  Evaluation for Ischemia History:  3/13 Echo EF 55-60% Cardiac Risk Factors: Family History - CAD and History of Smoking  Symptoms:  Chest Pressure with Exertion, DOE, Palpitations and SOB   Nuclear Pre-Procedure Caffeine/Decaff Intake:  None> 12 hrs NPO After: 11:00pm   Lungs:  clear O2 Sat: 98% on room air. IV 0.9% NS with Angio Cath:  22g  IV Site: R Antecubital x 1, tolerated well IV Started by:  Irean Hong, RN  Chest Size (in):  42 Cup Size: D  Height: 5\' 7"  (1.702 m)  Weight:  246 lb (111.585 kg)  BMI:  Body mass index is 38.53 kg/(m^2). Tech Comments:  n/a    Nuclear Med Study 1 or 2 day study: 1 day  Stress Test Type:  Lexiscan  Reading MD: Kristeen Miss, MD  Order Authorizing Provider:  Dietrich Pates, MD  Resting Radionuclide: Technetium 87m Tetrofosmin  Resting Radionuclide Dose: 11.0 mCi   Stress Radionuclide:  Technetium 31m Tetrofosmin  Stress Radionuclide Dose: 33. mCi           Stress Protocol Rest HR: 56 Stress HR: 101  Rest BP: 118/72 Stress BP: 152/72  Exercise Time (min): n/a METS: n/a   Predicted Max HR: 167 bpm % Max HR: 60.48 bpm Rate Pressure Product: 21308   Dose of Adenosine (mg):  n/a Dose of Lexiscan: 0.4 mg  Dose of Atropine (mg): n/a Dose of Dobutamine: n/a mcg/kg/min (at max HR)  Stress Test Technologist: Bonnita Levan, RN  Nuclear Technologist:  Domenic Polite, CNMT     Rest Procedure:  Myocardial perfusion imaging was performed at rest 45 minutes following the intravenous administration of Technetium 34m Tetrofosmin. Rest ECG: No acute changes  Stress Procedure:  The patient received IV Lexiscan 0.4 mg over 15-seconds.  Technetium 54m Tetrofosmin  injected at 30-seconds.  There were no significant changes with Lexiscan.  Quantitative spect images were obtained after a 45 minute delay. Stress ECG: No significant change from baseline ECG  QPS Raw Data Images:  Normal; no motion artifact; normal heart/lung ratio. Stress Images:  Normal homogeneous uptake in all areas of the myocardium. Rest Images:  Normal homogeneous uptake in all areas of the myocardium. Subtraction (SDS):  No evidence of ischemia. Transient Ischemic Dilatation (Normal <1.22):  0.89 Lung/Heart Ratio (Normal <0.45):  0.24  Quantitative Gated Spect Images QGS EDV:  108 ml QGS ESV:  41 ml  Impression Exercise Capacity:  Lexiscan with no exercise. BP Response:  Normal blood pressure response. Clinical Symptoms:  Mild chest pain/dyspnea. ECG Impression:  No significant ST segment change suggestive of ischemia. Comparison with Prior Nuclear Study: No images to compare  Overall Impression:  Normal stress nuclear study.  No evidence of ischemia.  Normal LV function LV Ejection Fraction: 62%.  LV Wall Motion:  Normal Wall Motion.    Vesta Mixer, Montez Hageman., MD, [facility], [place] 03/25/2012, 5:07 PM Office - [phone] Pager [phone]

## 2012-03-26 ENCOUNTER — Encounter: Payer: Self-pay | Admitting: *Deleted

## 2012-07-10 ENCOUNTER — Encounter: Payer: Self-pay | Admitting: Gastroenterology

## 2012-07-14 ENCOUNTER — Ambulatory Visit (AMBULATORY_SURGERY_CENTER): Payer: Medicare Other

## 2012-07-14 ENCOUNTER — Other Ambulatory Visit: Payer: Self-pay | Admitting: Oncology

## 2012-07-14 VITALS — Ht 67.5 in | Wt 241.0 lb

## 2012-07-14 DIAGNOSIS — Z09 Encounter for follow-up examination after completed treatment for conditions other than malignant neoplasm: Secondary | ICD-10-CM

## 2012-07-14 DIAGNOSIS — K319 Disease of stomach and duodenum, unspecified: Secondary | ICD-10-CM

## 2012-07-14 DIAGNOSIS — Z1231 Encounter for screening mammogram for malignant neoplasm of breast: Secondary | ICD-10-CM

## 2012-07-21 ENCOUNTER — Ambulatory Visit (AMBULATORY_SURGERY_CENTER): Payer: Medicare Other | Admitting: Gastroenterology

## 2012-07-21 ENCOUNTER — Encounter: Payer: Self-pay | Admitting: Gastroenterology

## 2012-07-21 VITALS — BP 133/82 | HR 90 | Temp 98.4°F | Resp 15 | Ht 67.5 in | Wt 241.0 lb

## 2012-07-21 DIAGNOSIS — R933 Abnormal findings on diagnostic imaging of other parts of digestive tract: Secondary | ICD-10-CM

## 2012-07-21 DIAGNOSIS — K319 Disease of stomach and duodenum, unspecified: Secondary | ICD-10-CM

## 2012-07-21 LAB — GLUCOSE, CAPILLARY: Glucose-Capillary: 214 mg/dL — ABNORMAL HIGH (ref 70–99)

## 2012-07-21 MED ORDER — SODIUM CHLORIDE 0.9 % IV SOLN: 500.0000 mL | INTRAVENOUS | Status: DC

## 2012-07-21 NOTE — Op Note (Addendum)
[facility] [address] [place], [address]   ENDOSCOPY PROCEDURE REPORT  PATIENT: [name], [name]  MR#: [id number] BIRTHDATE: Nov 16, 1955 , 55  yrs. old GENDER: Female ENDOSCOPIST: Rachael Fee, MD PROCEDURE DATE:  07/21/2012 PROCEDURE:  EGD, diagnostic ASA CLASS:     Class III INDICATIONS:  1. Routine risk for colon cancer; Feb 2009 colonoscopy with dr. Virginia Rochester, no polyps. Next colonoscopy should be 11/2017 2. submucosal gastric lesion, first noted early 2009. Endoscopic ultrasound procedures have been performed, none with firm tissue diagnosis, next Examination at 3 year interval given stability, small size of lesion. Repeat endoscopic ultrasound 2010, discrete submucosal lesion, 1.8 cm. Repeat evaluation was set for 3 year interval.  3. intermittent dysphagia since radiation, chemotherapy for lung cancer. Barium esophagram 2009 showed decreased peristalsis but no discrete strictures. MEDICATIONS: Fentanyl 50 mcg IV, Versed 6 mg IV, and These medications were titrated to patient response per physician's verbal order TOPICAL ANESTHETIC: Cetacaine Spray  DESCRIPTION OF PROCEDURE: After the risks benefits and alternatives of the procedure were thoroughly explained, informed consent was obtained.  The LB GIF-H180 [id number] endoscope was introduced through the mouth and advanced to the second portion of the duodenum. Without limitations.  The instrument was slowly withdrawn as the mucosa was fully examined.  Esophagus was normal, no strictures or stenosis.  There was a smooth round bulge in proximal stomach (measured 1.8cm across, used open forceps to most accurately judge size).  This has not appreciably changed in size in the past 3 years.  The examination was otherwise normal.  Retroflexed views revealed no abnormalities.     The scope was then withdrawn from the patient and the procedure completed. COMPLICATIONS: There were no complications.  ENDOSCOPIC  IMPRESSION: Esophagus was normal, no strictures or stenosis. The gastric submucosal lesion in proximal stomach has not changed in size since it was first noted 4 years ago.  RECOMMENDATIONS: Repeat EGD in 3 years. Chew your food well, eat slowly and take small bites.   eSigned:  Rachael Fee, MD 07/21/2012 11:18 AM Revised: 07/21/2012 11:18 AM  XB:[id number] Cleta Alberts, MD

## 2012-07-21 NOTE — Patient Instructions (Addendum)

## 2012-07-21 NOTE — Progress Notes (Signed)
Patient did not have preoperative order for IV antibiotic SSI prophylaxis. (G8918)  Patient did not experience any of the following events: a burn prior to discharge; a fall within the facility; wrong site/side/patient/procedure/implant event; or a hospital transfer or hospital admission upon discharge from the facility. (G8907)  

## 2012-07-22 ENCOUNTER — Telehealth: Payer: Self-pay | Admitting: *Deleted

## 2012-07-22 NOTE — Telephone Encounter (Signed)
  Follow up Call-  Call back number 07/21/2012 10/04/2011  Post procedure Call Back phone  # [phone] message ok  Permission to leave phone message Yes -     Patient questions:  Do you have a fever, pain , or abdominal swelling? no Pain Score  0 *  Have you tolerated food without any problems? yes  Have you been able to return to your normal activities? yes  Do you have any questions about your discharge instructions: Diet   no Medications  no Follow up visit  no  Do you have questions or concerns about your Care? no  Actions: * If pain score is 4 or above: No action needed, pain <4.

## 2012-08-13 ENCOUNTER — Ambulatory Visit
Admission: RE | Admit: 2012-08-13 | Discharge: 2012-08-13 | Disposition: A | Discharge status: Home / Self Care | Payer: Medicare Other | Source: Ambulatory Visit | Attending: Oncology | Admitting: Oncology

## 2012-08-13 DIAGNOSIS — Z1231 Encounter for screening mammogram for malignant neoplasm of breast: Secondary | ICD-10-CM

## 2012-11-04 ENCOUNTER — Emergency Department (HOSPITAL_COMMUNITY): Payer: Medicare Other

## 2012-11-04 ENCOUNTER — Encounter (HOSPITAL_COMMUNITY): Payer: Self-pay | Admitting: Emergency Medicine

## 2012-11-04 ENCOUNTER — Emergency Department (HOSPITAL_COMMUNITY)
Admission: EM | Admit: 2012-11-04 | Discharge: 2012-11-04 | Disposition: A | Discharge status: Home / Self Care | Payer: Medicare Other | Attending: Emergency Medicine | Admitting: Emergency Medicine

## 2012-11-04 ENCOUNTER — Ambulatory Visit (INDEPENDENT_AMBULATORY_CARE_PROVIDER_SITE_OTHER): Payer: Medicare Other

## 2012-11-04 VITALS — BP 144/76 | HR 65 | Wt 246.0 lb

## 2012-11-04 DIAGNOSIS — Z85118 Personal history of other malignant neoplasm of bronchus and lung: Secondary | ICD-10-CM | POA: Insufficient documentation

## 2012-11-04 DIAGNOSIS — Z862 Personal history of diseases of the blood and blood-forming organs and certain disorders involving the immune mechanism: Secondary | ICD-10-CM | POA: Insufficient documentation

## 2012-11-04 DIAGNOSIS — Z8679 Personal history of other diseases of the circulatory system: Secondary | ICD-10-CM | POA: Insufficient documentation

## 2012-11-04 DIAGNOSIS — R05 Cough: Secondary | ICD-10-CM | POA: Insufficient documentation

## 2012-11-04 DIAGNOSIS — R059 Cough, unspecified: Secondary | ICD-10-CM | POA: Insufficient documentation

## 2012-11-04 DIAGNOSIS — Z8709 Personal history of other diseases of the respiratory system: Secondary | ICD-10-CM | POA: Insufficient documentation

## 2012-11-04 DIAGNOSIS — R079 Chest pain, unspecified: Secondary | ICD-10-CM

## 2012-11-04 DIAGNOSIS — R110 Nausea: Secondary | ICD-10-CM | POA: Insufficient documentation

## 2012-11-04 DIAGNOSIS — R0602 Shortness of breath: Secondary | ICD-10-CM | POA: Insufficient documentation

## 2012-11-04 DIAGNOSIS — R011 Cardiac murmur, unspecified: Secondary | ICD-10-CM | POA: Insufficient documentation

## 2012-11-04 DIAGNOSIS — Z79899 Other long term (current) drug therapy: Secondary | ICD-10-CM | POA: Insufficient documentation

## 2012-11-04 DIAGNOSIS — J4489 Other specified chronic obstructive pulmonary disease: Secondary | ICD-10-CM | POA: Insufficient documentation

## 2012-11-04 DIAGNOSIS — E119 Type 2 diabetes mellitus without complications: Secondary | ICD-10-CM | POA: Insufficient documentation

## 2012-11-04 DIAGNOSIS — E669 Obesity, unspecified: Secondary | ICD-10-CM | POA: Insufficient documentation

## 2012-11-04 DIAGNOSIS — Z87891 Personal history of nicotine dependence: Secondary | ICD-10-CM | POA: Insufficient documentation

## 2012-11-04 DIAGNOSIS — Z87442 Personal history of urinary calculi: Secondary | ICD-10-CM | POA: Insufficient documentation

## 2012-11-04 DIAGNOSIS — R0789 Other chest pain: Secondary | ICD-10-CM | POA: Insufficient documentation

## 2012-11-04 DIAGNOSIS — M549 Dorsalgia, unspecified: Secondary | ICD-10-CM | POA: Insufficient documentation

## 2012-11-04 DIAGNOSIS — J449 Chronic obstructive pulmonary disease, unspecified: Secondary | ICD-10-CM | POA: Insufficient documentation

## 2012-11-04 DIAGNOSIS — Z872 Personal history of diseases of the skin and subcutaneous tissue: Secondary | ICD-10-CM | POA: Insufficient documentation

## 2012-11-04 DIAGNOSIS — R55 Syncope and collapse: Secondary | ICD-10-CM

## 2012-11-04 LAB — CBC WITH DIFFERENTIAL/PLATELET
Basophils Absolute: 0 10*3/uL (ref 0.0–0.1)
Basophils Relative: 0 % (ref 0–1)
Eosinophils Absolute: 0.4 10*3/uL (ref 0.0–0.7)
Eosinophils Relative: 5 % (ref 0–5)
HCT: 38.6 % (ref 36.0–46.0)
Hemoglobin: 13 g/dL (ref 12.0–15.0)
Lymphocytes Relative: 27 % (ref 12–46)
Lymphs Abs: 2 10*3/uL (ref 0.7–4.0)
MCH: 31.3 pg (ref 26.0–34.0)
MCHC: 33.7 g/dL (ref 30.0–36.0)
MCV: 93 fL (ref 78.0–100.0)
Monocytes Absolute: 0.5 10*3/uL (ref 0.1–1.0)
Monocytes Relative: 7 % (ref 3–12)
Neutro Abs: 4.4 10*3/uL (ref 1.7–7.7)
Neutrophils Relative %: 61 % (ref 43–77)
Platelets: 258 10*3/uL (ref 150–400)
RBC: 4.15 MIL/uL (ref 3.87–5.11)
RDW: 12.2 % (ref 11.5–15.5)
WBC: 7.3 10*3/uL (ref 4.0–10.5)

## 2012-11-04 LAB — URINALYSIS, ROUTINE W REFLEX MICROSCOPIC
Bilirubin Urine: NEGATIVE
Glucose, UA: >1000 mg/dL — AB
Hgb urine dipstick: NEGATIVE
Ketones, ur: NEGATIVE mg/dL
Leukocytes, UA: NEGATIVE
Nitrite: NEGATIVE
Protein, ur: NEGATIVE mg/dL
Specific Gravity, Urine: 1.038 — ABNORMAL HIGH (ref 1.005–1.030)
Urobilinogen, UA: 0.2 mg/dL (ref 0.0–1.0)
pH: 5 (ref 5.0–8.0)

## 2012-11-04 LAB — URINE MICROSCOPIC-ADD ON

## 2012-11-04 LAB — CG4 I-STAT (LACTIC ACID): Lactic Acid, Venous: 1.09 mmol/L (ref 0.5–2.2)

## 2012-11-04 LAB — BASIC METABOLIC PANEL
BUN: 17 mg/dL (ref 6–23)
CO2: 29 mEq/L (ref 19–32)
Calcium: 9.5 mg/dL (ref 8.4–10.5)
Chloride: 102 mEq/L (ref 96–112)
Creatinine, Ser: 0.75 mg/dL (ref 0.50–1.10)
GFR calc Af Amer: >90 mL/min (ref >90)
GFR calc non Af Amer: >90 mL/min (ref >90)
Glucose, Bld: 90 mg/dL (ref 70–99)
Potassium: 4.1 mEq/L (ref 3.5–5.1)
Sodium: 140 mEq/L (ref 135–145)

## 2012-11-04 LAB — POCT I-STAT TROPONIN I: Troponin i, poc: 0 ng/mL (ref 0.00–0.08)

## 2012-11-04 LAB — D-DIMER, QUANTITATIVE: D-Dimer, Quant: 0.53 ug/mL-FEU — ABNORMAL HIGH (ref 0.00–0.48)

## 2012-11-04 NOTE — ED Provider Notes (Signed)
History  This chart was scribed for Flint Melter, MD by Bennett Scrape, ED Scribe. This patient was seen in room A11C/A11C and the patient's care was started at 3:36 PM.  CSN: [id number]  Arrival date & time 11/04/12  1531   First MD Initiated Contact with Patient 11/04/12 1536      Chief Complaint  Patient presents with  . Chest Pain    The history is provided by the patient. No language interpreter was used.    [name] is a 57 y.o. female brought in by ambulance, who presents to the Emergency Department complaining of 2 weeks of intermittent left-sided CP described as a squeezing tightness that comes on suddenly and last 2 to 3 seconds at a time with associated intermittent SOB and nausea. She denies having the pain currently. She reports bilateral squeezing pain in bilateral upper arms that started 4 days ago, the left worse than the right. The pain is improved with sitting forward. She is a  transfer from [facility] cardiology but was not seen by a provider. Pt states that she was seen by a nurse, had an EKG done and her BP taken, c/o CP and the on-call provider sent her here for further evaluation. She denies taking OTC medications at home to improve symptoms. She was given ASA and nitro en route with improvement in the CP. she reports one episode of un witnessed LOC 4 days ago after returning from her brother-in-law's funeral. She reports a non-productive cough for the past week and back pain after a fall at work last week but denies leg pain or swelling, emesis, dizziness, lightheadedness, abdominal pain, and diaphoresis as associated symptoms. She is a former smoker but denies alcohol use.  Cardiologist is Dr. Tenny Craw for murmur and aortic regurgitation Dr. Vassie Loll was Pulmonologist for lung CA in 2001 that was treated with radiation and a collapse on the right in 2007. Lung was re-inflated and she states that she went to pulmonary rehab.    Past Medical History  Diagnosis Date  .  lung ca dx'd 10/1999    chemo/xrt comp 05/29/2000  . Insomnia   . Nephrolithiasis   . Obesity   . Erythema nodosum   . Aortic insufficiency   . Diabetes mellitus   . Allergy   . Anemia   . COPD (chronic obstructive pulmonary disease)   . Heart murmur   . Myalgia   . Kidney stones   . Aortic regurgitation   . 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 Pulmonary fibrosis 01/20/2012    Due to previous surgery and chest radiation for lung cancer  . Pneumothorax, spontaneous, tension 01/20/2012    October, 2010    Past Surgical History  Procedure Date  . Chest surgery     bilateral lung  . Shoulder surgery     right  . Inner ear surgery     right   . Abdominal hysterectomy   . Colonoscopy   . Upper gastrointestinal endoscopy   . Total knee arthroplasty     RIGHT  . Carpal tunnel release     RIGHT in 1998/Left hand in 2009  . Pulmonary embolism surgery 2007    LUNG COLLAPSE  . Esophageal dilation   . Knee operation     LEFT LATERAL RELEASE  . Foot surgery     Callous removed /Bil  . Appendectomy  Family History  Problem Relation Age of Onset  . Heart disease Father   . Diabetes Father   . Pancreatic cancer Mother   . Diabetes Mother   . Hypertension Sister     x 3  . Diabetes Sister     x 4    History  Substance Use Topics  . Smoking status: Former Smoker    Quit date: 07/22/1999  . Smokeless tobacco: Never Used  . Alcohol Use: No    No OB history provided.  Review of Systems  Constitutional: Negative for fever and chills.  Respiratory: Positive for cough and shortness of breath.   Cardiovascular: Positive for chest pain. Negative for leg swelling.  Gastrointestinal: Positive for nausea. Negative for vomiting, abdominal pain, diarrhea and constipation.  Musculoskeletal: Positive for back pain.  All other systems reviewed and are  negative.    Allergies  Hydrocodone; Morphine and related; and Nsaids  Home Medications   Current Outpatient Rx  Name  Route  Sig  Dispense  Refill  . CANAGLIFLOZIN 100 MG PO TABS   Oral   Take 100 mg by mouth daily.          [name] GLIMEPIRIDE 4 MG PO TABS   Oral   Take 8 mg by mouth daily before breakfast.         . NAPROXEN SODIUM 220 MG PO TABS   Oral   Take 400 mg by mouth every 4 (four) hours as needed. For pain/leg hip         . SITAGLIPTIN PHOSPHATE 100 MG PO TABS   Oral   Take 100 mg by mouth daily.             BP 131/65  Pulse 80  Temp 98.7 F (37.1 C) (Oral)  Resp 18  SpO2 100%  Physical Exam  Nursing note and vitals reviewed. Constitutional: She is oriented to person, place, and time. She appears well-developed and well-nourished. No distress.  HENT:  Head: Normocephalic and atraumatic.  Eyes: Conjunctivae normal and EOM are normal.  Neck: Neck supple. No tracheal deviation present.  Cardiovascular: Normal rate and regular rhythm.   Murmur (1-2/6 systolic murmur at the right upper border) heard. Pulmonary/Chest: Effort normal and breath sounds normal. No respiratory distress. She exhibits tenderness (bilaterak upper chest tenderness to palpation).  Abdominal: Soft. Bowel sounds are normal. There is tenderness (diffuse mild abdominal tenderness). There is no rebound and no guarding.  Musculoskeletal: Normal range of motion. She exhibits no edema.  Neurological: She is alert and oriented to person, place, and time.  Skin: Skin is warm and dry.  Psychiatric: She has a normal mood and affect. Her behavior is normal.    ED Course  Procedures (including critical care time)  DIAGNOSTIC STUDIES:Oxygen Saturation is 100% on room air, normal by my interpretation.    COORDINATION OF CARE: 3:48 PM- Discussed treatment plan which includes CXR, CBC panel, UA and d-dimer with pt at bedside and pt agreed to plan.   Reevaluation: 17:05- she's been immature  without problem. She has no further complaints. She has no chest pain. Her sister is here with her in the ED, now.  Labs Reviewed  URINALYSIS, ROUTINE W REFLEX MICROSCOPIC - Abnormal; Notable for the following:    Specific Gravity, Urine 1.038 (*)     Glucose, UA >1000 (*)     All other components within normal limits  D-DIMER, QUANTITATIVE - Abnormal; Notable for the following:    D-Dimer, Quant 0.53 (*)  All other components within normal limits  URINE MICROSCOPIC-ADD ON - Abnormal; Notable for the following:    Bacteria, UA FEW (*)     All other components within normal limits  CBC WITH DIFFERENTIAL  BASIC METABOLIC PANEL  POCT I-STAT TROPONIN I  CG4 I-STAT (LACTIC ACID)  URINE CULTURE   Dg Chest Port 1 View  11/04/2012  *RADIOLOGY REPORT*  Clinical Data: Chest pain and shortness of breath  PORTABLE CHEST - 1 VIEW  Comparison: CT chest from 01/14/2012.  Chest x-ray from 06/20/2011 and 07/29/2010.  Findings: Stable asymmetric elevation of the left hemidiaphragm. The abnormal right cardiomediastinal contour persists and is stable back to the 07/29/2010 exam.  Surgical clips are seen in the left suprahilar region with associated left apical pleuroparenchymal scarring.  No evidence for pulmonary edema or pleural effusion.  IMPRESSION: Stable exam.  No new or acute interval findings.   Original Report Authenticated By: Kennith Center, M.D.      1. Chest pain   2. Syncope       MDM  Nonspecific chest pain , and episode of syncope, with negative ED evaluation. The d-dimer is 0.53, which is less than 1.0, hence, does not indicate a PE, as the source of her pain. There is no tachypnea, tachycardia or hypoxia.  She has been ambulatory with no distress. Doubt recurrence of lung cancer, or pneumothorax. Doubt ACS, worsening, aortic regurgitation, pneumonia, or metabolic instability.    I personally performed the services described in this documentation, which was scribed in my presence.  The recorded information has been reviewed and is accurate.     Plan: Home Medications- usual; Home Treatments- rest; Recommended follow up- PCP for check up 1 week.   Flint Melter, MD 11/04/12 [id number]

## 2012-11-04 NOTE — ED Notes (Signed)
BIB [facility]. Off and on CP for past 2 weeks. Seen at [facility] this am. Sent over by same by on-call MD. States chest feels tight

## 2012-11-04 NOTE — Progress Notes (Signed)
Patient walked in office with chest pain.Patient stated while in shower this morning she had chest pain radiating down left arm.States she has been having chest pain off and on for 2 weeks.States she is having a # 7 chest pain with pain in left arm. EKG was done.Spoke to DOD Dr.Allred he advised to call 911 and send patient to [facility] ER. 911 was called and patient was taken to [facility] ER.

## 2012-11-04 NOTE — ED Notes (Signed)
Pt ambulated in the hallway and to the bathroom without assistance.

## 2012-11-05 LAB — URINE CULTURE: Colony Count: 9000

## 2012-11-08 ENCOUNTER — Ambulatory Visit: Payer: Medicare Other

## 2012-11-08 ENCOUNTER — Ambulatory Visit (INDEPENDENT_AMBULATORY_CARE_PROVIDER_SITE_OTHER): Payer: Medicare Other | Admitting: Emergency Medicine

## 2012-11-08 VITALS — BP 110/72 | HR 66 | Temp 98.4°F | Resp 16 | Ht 68.5 in | Wt 242.0 lb

## 2012-11-08 DIAGNOSIS — R079 Chest pain, unspecified: Secondary | ICD-10-CM

## 2012-11-08 DIAGNOSIS — R002 Palpitations: Secondary | ICD-10-CM

## 2012-11-08 DIAGNOSIS — R55 Syncope and collapse: Secondary | ICD-10-CM

## 2012-11-08 LAB — POCT CBC
Granulocyte percent: 64.1 %G (ref 37–80)
HCT, POC: 41.6 % (ref 37.7–47.9)
Hemoglobin: 12.5 g/dL (ref 12.2–16.2)
Lymph, poc: 1.7 (ref 0.6–3.4)
MCH, POC: 29.4 pg (ref 27–31.2)
MCHC: 30 g/dL — AB (ref 31.8–35.4)
MCV: 98 fL — AB (ref 80–97)
MID (cbc): 0.5 (ref 0–0.9)
MPV: 8 fL (ref 0–99.8)
POC Granulocyte: 3.8 (ref 2–6.9)
POC LYMPH PERCENT: 28 %L (ref 10–50)
POC MID %: 7.9 %M (ref 0–12)
Platelet Count, POC: 311 10*3/uL (ref 142–424)
RBC: 4.25 M/uL (ref 4.04–5.48)
RDW, POC: 13.3 %
WBC: 5.9 10*3/uL (ref 4.6–10.2)

## 2012-11-08 LAB — D-DIMER, QUANTITATIVE: D-Dimer, Quant: 0.44 ug/mL-FEU (ref 0.00–0.48)

## 2012-11-08 LAB — POCT SEDIMENTATION RATE: POCT SED RATE: 40 mm/hr — AB (ref 0–22)

## 2012-11-08 NOTE — Progress Notes (Signed)
  Subjective:    Patient ID: [name], female    DOB: 1956-07-09, 58 y.o.   MRN: [id number]  HPI  Patient had a recent syncope episode and went to the ER on 11/04/2012. Patient stated she had tachycardia which lasted a few seconds with tightness in her chest. Patients blood pressure was elevated. She also experienced shortness of breath.  Patients brother in law passed away in 10/31/23 and she wasn't able to be here for her sister. She said she experienced stress.  Review of Systems     Objective:   Physical Exam patient is alert and cooperative and does not appear in any distress. Her neck is supple. Her chest is essentially clear there is a rub-like sound in the left anterior chest the cardiac exam reveals a 2/6 diastolic murmur left sternal border. There is tenderness to palpation over the left costal sternal junction ribs 4 through 6  Results for orders placed in visit on 11/08/12  POCT CBC      Component Value Range   WBC 5.9  4.6 - 10.2 K/uL   Lymph, poc 1.7  0.6 - 3.4   POC LYMPH PERCENT 28.0  10 - 50 %L   MID (cbc) 0.5  0 - 0.9   POC MID % 7.9  0 - 12 %M   POC Granulocyte 3.8  2 - 6.9   Granulocyte percent 64.1  37 - 80 %G   RBC 4.25  4.04 - 5.48 M/uL   Hemoglobin 12.5  12.2 - 16.2 g/dL   HCT, POC 40.9  81.1 - 47.9 %   MCV 98.0 (*) 80 - 97 fL   MCH, POC 29.4  27 - 31.2 pg   MCHC 30.0 (*) 31.8 - 35.4 g/dL   RDW, POC 91.4     Platelet Count, POC 311  142 - 424 K/uL   MPV 8.0  0 - 99.8 fL   [facility] reading (PRIMARY) by  Dr. Cleta Alberts there is scarring present in the left upper chest with clips present there is elevation of the diaphragm. This appears similar to the x-ray report from 4 days ago.       Assessment & Plan:  Maryclare Labrador go ahead and make referral to Dr. Tenny Craw. Her d-dimer was very minimally elevated when she was in the hospital repeat this. Her pulse ox is good her chest x-ray to me looks unchanged from 1 she just had in the hospital. She is tender over the chest wall on  the left and was advised anti-inflammatories for this

## 2012-11-09 ENCOUNTER — Other Ambulatory Visit: Payer: Self-pay | Admitting: Emergency Medicine

## 2012-11-09 ENCOUNTER — Telehealth: Payer: Self-pay | Admitting: Emergency Medicine

## 2012-11-09 DIAGNOSIS — R079 Chest pain, unspecified: Secondary | ICD-10-CM

## 2012-11-09 NOTE — Telephone Encounter (Signed)
Patient states she continues to have significant pain in her chest. She has not gotten relief with Aleve . She is going to change to Mobic 7.5 to take one twice a day with food. She does not want to go to the emergency room at the present time. I told her we would call the cardiology office first thing in the morning and see if we can get her in for evaluation as soon as possible. I called back and talked referrals they tell me they did have an appointment for her to be seen at 11 AM tomorrow at the cardiology office. She again was instructed to go the emergency room if she had worsening of her pain

## 2012-11-10 ENCOUNTER — Ambulatory Visit (INDEPENDENT_AMBULATORY_CARE_PROVIDER_SITE_OTHER): Payer: Medicare Other | Admitting: Nurse Practitioner

## 2012-11-10 ENCOUNTER — Encounter: Payer: Self-pay | Admitting: Nurse Practitioner

## 2012-11-10 ENCOUNTER — Encounter (INDEPENDENT_AMBULATORY_CARE_PROVIDER_SITE_OTHER): Payer: Medicare Other

## 2012-11-10 VITALS — BP 132/76 | HR 68 | Ht 68.0 in | Wt 244.8 lb

## 2012-11-10 DIAGNOSIS — R002 Palpitations: Secondary | ICD-10-CM

## 2012-11-10 DIAGNOSIS — R55 Syncope and collapse: Secondary | ICD-10-CM

## 2012-11-10 MED ORDER — OMEPRAZOLE 20 MG PO CPDR: 20.0000 mg | DELAYED_RELEASE_CAPSULE | Freq: Every day | ORAL | Status: DC

## 2012-11-10 NOTE — Progress Notes (Signed)
[name] Date of Birth: [birth year] Medical Record #[id number]  History of Present Illness: Candida is seen back today for a work in visit. She is seen for Dr. Tenny Craw. She has known valvular heart disease with AI. Has had lung cancer in 2001 s/p chemo and radiation. Sees pulmonary and oncology. Has chronic dyspnea due to restrictive lung disease.   She walked in to the office last Wednesday with chest pain. Sent to the ER. Negative evaluation. Saw her PCP yesterday and told to follow up here. Had had a syncopal episode back on the 11th of the month while standing up and getting clothes ready for church. May have been out for "just a flash".   She comes here today. She is alone. Less chest pain. Has some palpable chest wall pain. Having palpitations as well. Feels like she could "belch or throw up". These spells come and go. She continues to have shortness of breath but feels it is unchanged. She admits to anxiety and stress. Was advised by Dr. Cleta Alberts to use some advil/tylenol but doesn't look like she has. Feels her heart racing and is worried about recurrent syncope. She has had a negative stress test back in the summer. Sees oncology this spring. Sugars have been ok. She wonders if she is having panic attacks.   Current Outpatient Prescriptions on File Prior to Visit  Medication Sig Dispense Refill  . Canagliflozin (INVOKANA) 100 MG TABS Take 100 mg by mouth daily.       [name] glimepiride (AMARYL) 4 MG tablet Take 8 mg by mouth daily before breakfast.      . sitaGLIPtin (JANUVIA) 100 MG tablet Take 100 mg by mouth daily.          Allergies  Allergen Reactions  . Hydrocodone Hives and Itching  . Morphine And Related Other (See Comments)    GI PROBLEMS  . Nsaids Other (See Comments)    GI PROBLEMS    Past Medical History  Diagnosis Date  . lung ca dx'd 10/1999    chemo/xrt comp 05/29/2000  . Insomnia   . Nephrolithiasis   . Obesity   . Erythema nodosum   . Aortic insufficiency   .  Diabetes mellitus   . Allergy   . Anemia   . COPD (chronic obstructive pulmonary disease)   . Heart murmur   . Myalgia   . Kidney stones   . Aortic regurgitation   . 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 Pulmonary fibrosis 01/20/2012    Due to previous surgery and chest radiation for lung cancer  . Pneumothorax, spontaneous, tension 01/20/2012    October, 2010    Past Surgical History  Procedure Date  . Chest surgery     bilateral lung  . Shoulder surgery     right  . Inner ear surgery     right   . Abdominal hysterectomy   . Colonoscopy   . Upper gastrointestinal endoscopy   . Total knee arthroplasty     RIGHT  . Carpal tunnel release     RIGHT in 1998/Left hand in 2009  . Pulmonary embolism surgery 2007    LUNG COLLAPSE  . Esophageal dilation   . Knee operation     LEFT LATERAL RELEASE  . Foot surgery     Callous removed /Bil  . Appendectomy     History  Smoking status  .  Former Smoker  . Quit date: 07/22/1999  Smokeless tobacco  . Never Used    History  Alcohol Use No    Family History  Problem Relation Age of Onset  . Heart disease Father   . Diabetes Father   . Pancreatic cancer Mother   . Diabetes Mother   . Hypertension Sister     x 3  . Diabetes Sister     x 4    Review of Systems: The review of systems is per the HPI.  All other systems were reviewed and are negative.  Physical Exam: BP 132/76  Pulse 68  Ht 5\' 8"  (1.727 m)  Wt 244 lb 12.8 oz (111.041 kg)  BMI 37.22 kg/m2 Patient is very pleasant and in no acute distress. She is obese. Skin is warm and dry. Color is normal.  HEENT is unremarkable. Normocephalic/atraumatic. PERRL. Sclera are nonicteric. Neck is supple. No masses. No JVD. Lungs are clear. Some palpable chest wall pain noted. Cardiac exam shows a regular rate and rhythm.harsh outflow murmur noted. Abdomen is  soft. Extremities are without edema. Gait and ROM are intact. No gross neurologic deficits noted.  LABORATORY DATA:  Myoview Overall Impression: Normal stress nuclear study. No evidence of ischemia. Normal LV function  LV Ejection Fraction: 62%. LV Wall Motion: Normal Wall Motion.  Vesta Mixer, Montez Hageman., MD, [facility]  03/25/2012, 5:07 PM   Echo Study Conclusions from March 2013  - Left ventricle: The cavity size was normal. Wall thickness was normal. Systolic function was normal. The estimated ejection fraction was in the range of 55% to 60%. Wall motion was normal; there were no regional wall motion abnormalities. Doppler parameters are consistent with abnormal left ventricular relaxation (grade 1 diastolic dysfunction). - Aortic valve: Moderate regurgitation. - Pericardium, extracardiac: A small pericardial effusion was identified.  D Dimer 1/19 was 0.44 (normal) Sed Rate was elevated at 40  Lab Results  Component Value Date   WBC 5.9 11/08/2012   HGB 12.5 11/08/2012   HCT 41.6 11/08/2012   PLT 258 11/04/2012   GLUCOSE 90 11/04/2012   CHOL 135 03/13/2012   TRIG 86.0 03/13/2012   HDL 44.70 03/13/2012   LDLCALC 73 03/13/2012   ALT 15 06/20/2011   ALT 15 06/20/2011   AST 20 01/14/2012   NA 140 11/04/2012   K 4.1 11/04/2012   CL 102 11/04/2012   CREATININE 0.75 11/04/2012   BUN 17 11/04/2012   CO2 29 11/04/2012   TSH 1.20 08/03/2007   HGBA1C 7.5* 08/03/2007   MICROALBUR 0.8 08/03/2007   No results found for this basename: CKTOTAL, CKMB, CKMBINDEX, TROPONINI   Dg Chest 2 View  11/08/2012  *RADIOLOGY REPORT*  Clinical Data: Chest pain.  Known history of lung cancer.  CHEST - 2 VIEW  Comparison: 11/04/2012  Findings: There are surgical clips in the medial left lung apex. There is volume loss of the left hemithorax with associated elevation of the left hemidiaphragm. Chronic pleuroparenchymal thickening at the left lung apex.  No focal airspace opacity, effusion, or edema.  Cardiomediastinal  silhouette is stable. Slight patient motion on the lateral view.  No acute osseous abnormality identified.  IMPRESSION: Stable examination.  Postoperative changes of the left hemithorax. No acute cardiopulmonary disease.  Clinically significant discrepancy from primary report, if provided: None   Original Report Authenticated By: Britta Mccreedy, M.D.    Dg Chest Port 1 View  11/04/2012  IMPRESSION: Stable exam.  No new or acute interval findings.   Original  Report Authenticated By: Kennith Center, M.D.    Assessment / Plan: 1. Palpitations/syncope - will place an event monitor. For now, no change in her medicines.   2. Chest pain - sed rate is up some. She is to use tylenol/advil as needed. Will also add PPI therapy. Negative Myoview back in the summer. Symptoms are reportedly improving at this time.   3. AI - has a pretty harsh murmur. Will update her echo.  Follow up with Dr. Tenny Craw in 5 weeks.   Patient is agreeable to this plan and will call if any problems develop in the interim.

## 2012-11-10 NOTE — Patient Instructions (Addendum)
We are going to place an event monitor today  We are going to repeat your ultrasound of your heart  Take omeprazole 20 mg a day. This is at the drug store.   Ok to use Tylenol or Advil  See Dr. Tenny Craw in 5 weeks  Call the [facility] office at [phone] if you have any questions, problems or concerns.

## 2012-11-13 ENCOUNTER — Ambulatory Visit (HOSPITAL_COMMUNITY): Payer: Medicare Other | Attending: Cardiology | Admitting: Radiology

## 2012-11-13 DIAGNOSIS — J4489 Other specified chronic obstructive pulmonary disease: Secondary | ICD-10-CM | POA: Insufficient documentation

## 2012-11-13 DIAGNOSIS — R002 Palpitations: Secondary | ICD-10-CM | POA: Insufficient documentation

## 2012-11-13 DIAGNOSIS — R55 Syncope and collapse: Secondary | ICD-10-CM

## 2012-11-13 DIAGNOSIS — I359 Nonrheumatic aortic valve disorder, unspecified: Secondary | ICD-10-CM | POA: Insufficient documentation

## 2012-11-13 DIAGNOSIS — J449 Chronic obstructive pulmonary disease, unspecified: Secondary | ICD-10-CM | POA: Insufficient documentation

## 2012-11-13 DIAGNOSIS — E119 Type 2 diabetes mellitus without complications: Secondary | ICD-10-CM | POA: Insufficient documentation

## 2012-11-13 NOTE — Progress Notes (Signed)
Echocardiogram performed.  

## 2012-11-18 ENCOUNTER — Telehealth: Payer: Self-pay | Admitting: *Deleted

## 2012-11-18 NOTE — Telephone Encounter (Signed)
Pt given results of ECHO.

## 2012-12-17 ENCOUNTER — Encounter: Payer: Self-pay | Admitting: Internal Medicine

## 2012-12-17 ENCOUNTER — Ambulatory Visit (INDEPENDENT_AMBULATORY_CARE_PROVIDER_SITE_OTHER): Payer: Medicare Other | Admitting: Internal Medicine

## 2012-12-17 VITALS — BP 123/78 | HR 93 | Ht 67.0 in | Wt 246.0 lb

## 2012-12-17 DIAGNOSIS — R079 Chest pain, unspecified: Secondary | ICD-10-CM

## 2012-12-17 DIAGNOSIS — I351 Nonrheumatic aortic (valve) insufficiency: Secondary | ICD-10-CM

## 2012-12-17 DIAGNOSIS — I359 Nonrheumatic aortic valve disorder, unspecified: Secondary | ICD-10-CM

## 2012-12-17 NOTE — Progress Notes (Signed)
HPI Patient is a 57 yo who has a history of AI  Also history of remote lung CA  She was seen recently by Darrel Hoover in clinic   Had been seen in ER prior for CP that was felt to be more musculoskeletal.  Had neg myoview in June 2013.  Also had complaints of palpitations.  History of syncope in Jan  Occurred while getting ready for church She was set up for event monitor  This showed no signif arrhythmia  SInce seen she continues to have CP that is worse with pressing chest.  Denies significant palpitations  Allergies  Allergen Reactions  . Hydrocodone Hives and Itching  . Morphine And Related Other (See Comments)    GI PROBLEMS  . Nsaids Other (See Comments)    GI PROBLEMS    Current Outpatient Prescriptions  Medication Sig Dispense Refill  . Canagliflozin (INVOKANA) 100 MG TABS Take 100 mg by mouth daily.       [name] glimepiride (AMARYL) 4 MG tablet Take 8 mg by mouth daily before breakfast.      . sitaGLIPtin (JANUVIA) 100 MG tablet Take 100 mg by mouth daily.         No current facility-administered medications for this visit.    Past Medical History  Diagnosis Date  . lung ca dx'd 10/1999    chemo/xrt comp 05/29/2000  . Insomnia   . Nephrolithiasis   . Obesity   . Erythema nodosum   . Aortic insufficiency   . Diabetes mellitus   . Allergy   . Anemia   . COPD (chronic obstructive pulmonary disease)   . Heart murmur   . Myalgia   . Kidney stones   . Aortic regurgitation   . 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 Pulmonary fibrosis 01/20/2012    Due to previous surgery and chest radiation for lung cancer  . Pneumothorax, spontaneous, tension 01/20/2012    October, 2010    Past Surgical History  Procedure Laterality Date  . Chest surgery      bilateral lung  . Shoulder surgery      right  . Inner ear surgery      right   . Abdominal hysterectomy    .  Colonoscopy    . Upper gastrointestinal endoscopy    . Total knee arthroplasty      RIGHT  . Carpal tunnel release      RIGHT in 1998/Left hand in 2009  . Pulmonary embolism surgery  2007    LUNG COLLAPSE  . Esophageal dilation    . Knee operation      LEFT LATERAL RELEASE  . Foot surgery      Callous removed /Bil  . Appendectomy      Family History  Problem Relation Age of Onset  . Heart disease Father   . Diabetes Father   . Pancreatic cancer Mother   . Diabetes Mother   . Hypertension Sister     x 3  . Diabetes Sister     x 4    History   Social History  . Marital Status: Single    Spouse Name: N/A    Number of Children: 0  . Years of Education: N/A   Occupational History  . custodian    Social History Main Topics  . Smoking status: Former Smoker    Quit date: 07/22/1999  .  Smokeless tobacco: Never Used  . Alcohol Use: No  . Drug Use: No  . Sexually Active: Not on file   Other Topics Concern  . Not on file   Social History Narrative  . No narrative on file    Review of Systems:  All systems reviewed.  They are negative to the above problem except as previously stated.  Vital Signs: BP 123/78  Pulse 93  Ht 5\' 7"  (1.702 m)  Wt 246 lb (111.585 kg)  BMI 38.52 kg/m2  Physical Exam  HEENT:  Normocephalic, atraumatic. EOMI, PERRLA.  Neck: JVP is normal.  No bruits.  Lungs: clear to auscultation. No rales no wheezes.  Heart: Regular rate and rhythm. Normal S1, S2. No S3.  II/VI sys  murmur. PMI not displaced.  Abdomen:  Supple, nontender. Normal bowel sounds. No masses. No hepatomegaly.  Extremities:   Good distal pulses throughout. No lower extremity edema.  Musculoskeletal :moving all extremities.  Neuro:   alert and oriented x3.  CN II-XII grossly intact.   Assessment and Plan:  1.  CP  Atypical for cardiac  Appears musculoskeletal.  Pain meds prn and rest  2.  Palpitations.  Follow    3.  AI  MIld to mod on echo.  F/u in 1 year  clinically

## 2012-12-18 ENCOUNTER — Telehealth: Payer: Self-pay | Admitting: Internal Medicine

## 2012-12-18 NOTE — Telephone Encounter (Signed)
Pt was in yesterday and was to call back re medication , pls call

## 2012-12-18 NOTE — Telephone Encounter (Signed)
Pt called in to report that she has costochondritis.  Noted.

## 2013-01-13 ENCOUNTER — Ambulatory Visit (HOSPITAL_COMMUNITY)
Admission: RE | Admit: 2013-01-13 | Discharge: 2013-01-13 | Disposition: A | Discharge status: Home / Self Care | Payer: Medicare Other | Source: Ambulatory Visit | Attending: Oncology | Admitting: Oncology

## 2013-01-13 DIAGNOSIS — E119 Type 2 diabetes mellitus without complications: Secondary | ICD-10-CM | POA: Insufficient documentation

## 2013-01-13 DIAGNOSIS — J449 Chronic obstructive pulmonary disease, unspecified: Secondary | ICD-10-CM | POA: Insufficient documentation

## 2013-01-13 DIAGNOSIS — J4489 Other specified chronic obstructive pulmonary disease: Secondary | ICD-10-CM | POA: Insufficient documentation

## 2013-01-13 DIAGNOSIS — C3400 Malignant neoplasm of unspecified main bronchus: Secondary | ICD-10-CM | POA: Insufficient documentation

## 2013-01-13 DIAGNOSIS — J8410 Pulmonary fibrosis, unspecified: Secondary | ICD-10-CM | POA: Insufficient documentation

## 2013-01-19 ENCOUNTER — Other Ambulatory Visit (HOSPITAL_BASED_OUTPATIENT_CLINIC_OR_DEPARTMENT_OTHER): Payer: Medicare Other | Admitting: Lab

## 2013-01-19 ENCOUNTER — Ambulatory Visit (HOSPITAL_BASED_OUTPATIENT_CLINIC_OR_DEPARTMENT_OTHER): Payer: Medicare Other | Admitting: Oncology

## 2013-01-19 ENCOUNTER — Telehealth: Payer: Self-pay | Admitting: Oncology

## 2013-01-19 VITALS — BP 132/75 | HR 77 | Temp 97.6°F | Resp 20 | Ht 67.0 in | Wt 244.8 lb

## 2013-01-19 DIAGNOSIS — E119 Type 2 diabetes mellitus without complications: Secondary | ICD-10-CM

## 2013-01-19 DIAGNOSIS — J449 Chronic obstructive pulmonary disease, unspecified: Secondary | ICD-10-CM

## 2013-01-19 DIAGNOSIS — Z85118 Personal history of other malignant neoplasm of bronchus and lung: Secondary | ICD-10-CM

## 2013-01-19 DIAGNOSIS — J701 Chronic and other pulmonary manifestations due to radiation: Secondary | ICD-10-CM

## 2013-01-19 DIAGNOSIS — J8410 Pulmonary fibrosis, unspecified: Secondary | ICD-10-CM

## 2013-01-19 DIAGNOSIS — C3490 Malignant neoplasm of unspecified part of unspecified bronchus or lung: Secondary | ICD-10-CM

## 2013-01-19 DIAGNOSIS — C3400 Malignant neoplasm of unspecified main bronchus: Secondary | ICD-10-CM

## 2013-01-19 LAB — COMPREHENSIVE METABOLIC PANEL (CC13)
ALT: 18 U/L (ref 0–55)
AST: 16 U/L (ref 5–34)
Albumin: 3.4 g/dL — ABNORMAL LOW (ref 3.5–5.0)
Alkaline Phosphatase: 105 U/L (ref 40–150)
BUN: 21.5 mg/dL (ref 7.0–26.0)
CO2: 28 mEq/L (ref 22–29)
Calcium: 9.5 mg/dL (ref 8.4–10.4)
Chloride: 105 mEq/L (ref 98–107)
Creatinine: 1 mg/dL (ref 0.6–1.1)
Glucose: 180 mg/dl — ABNORMAL HIGH (ref 70–99)
Potassium: 4 mEq/L (ref 3.5–5.1)
Sodium: 143 mEq/L (ref 136–145)
Total Bilirubin: 0.38 mg/dL (ref 0.20–1.20)
Total Protein: 7.3 g/dL (ref 6.4–8.3)

## 2013-01-19 LAB — CBC WITH DIFFERENTIAL/PLATELET
BASO%: 0.4 % (ref 0.0–2.0)
Basophils Absolute: 0 10*3/uL (ref 0.0–0.1)
EOS%: 5.3 % (ref 0.0–7.0)
Eosinophils Absolute: 0.3 10*3/uL (ref 0.0–0.5)
HCT: 37.9 % (ref 34.8–46.6)
HGB: 12.4 g/dL (ref 11.6–15.9)
LYMPH%: 23.2 % (ref 14.0–49.7)
MCH: 30.7 pg (ref 25.1–34.0)
MCHC: 32.7 g/dL (ref 31.5–36.0)
MCV: 93.9 fL (ref 79.5–101.0)
MONO#: 0.4 10*3/uL (ref 0.1–0.9)
MONO%: 6.6 % (ref 0.0–14.0)
NEUT#: 3.5 10*3/uL (ref 1.5–6.5)
NEUT%: 64.5 % (ref 38.4–76.8)
Platelets: 268 10*3/uL (ref 145–400)
RBC: 4.04 10*6/uL (ref 3.70–5.45)
RDW: 12.8 % (ref 11.2–14.5)
WBC: 5.5 10*3/uL (ref 3.9–10.3)
lymph#: 1.3 10*3/uL (ref 0.9–3.3)

## 2013-01-19 LAB — LACTATE DEHYDROGENASE (CC13): LDH: 209 U/L (ref 125–245)

## 2013-01-20 NOTE — Progress Notes (Signed)
Hematology and Oncology Follow Up Visit  [name] [id number] 12/09/1955 57 y.o. 01/20/2013 7:04 PM   Principle Diagnosis: Encounter Diagnoses  Name Primary?  . NEOP, MALIG, BRONCHUS/LUNG NEC Yes  . Pulmonary fibrosis   . COPD (chronic obstructive pulmonary disease)      Interim History:   "[name]" is now out over 13 years from diagnosis of stage IIIB non-small cell lung cancer presenting in January, 2001 as dysphagia due to [name] mediastinal lymph node that was compressing her esophagus. She underwent initial surgical resection followed by radiation and chemotherapy. She has had no evidence for recurrence through most recent scan done 01/14/12.  She has developed progressive dyspnea and exercise intolerance over the years, likely due to combined long term effects of the radiation and surgery. She is now seeing one of the pulmonary critical care physicians, Dr. Vassie Loll. She has had [name] declining FEV1, last checked one year ago down to 40%, DLCO 47%. At time of [name] visit on September 06, 2010, Advair discus 250/50 mcg, one puff b.i.d. p.r.n. and ProAir HFA albuterol inhaler, one to two puffs as needed added to her medication regimen. She has an intermittent chronic dry cough.  She was found to have [name] submucosal lesion in her stomach back in March, 2009. Biopsies were nondiagnostic. Most recent upper endoscopy done 07/21/2012 showed no change in the size of this lesion.  She has had [name] number of recent episodes of syncope and presyncope. This can occur when she is sitting and does not appear to be related to position. She does not have the sensation that she is having rapid or irregular heartbeat. She has been evaluated by cardiology Dr. Tenny Craw. [name] Holter monitor was unrevealing for any significant arrhythmias. She was found to have mild to moderate aortic insufficiency on an echocardiogram which is being followed. She has had [name] recent followup visit with Dr. Tenny Craw on February 27. 2014.    Medications:  reviewed  Allergies:  Allergies  Allergen Reactions  . Hydrocodone Hives and Itching  . Morphine And Related Other (See Comments)    GI PROBLEMS  . Nsaids Other (See Comments)    GI PROBLEMS    Review of Systems: Constitutional:   No constitutional symptoms  Respiratory: Dyspnea with exertion but not at rest Cardiovascular: See above.  Gastrointestinal: No change in bowel habit Genito-Urinary: No urinary tract symptoms. She is post hysterectomy. Musculoskeletal: No muscle bone or joint pain Neurologic: See above. No headache or change in vision Skin: No rash or ecchymosis Remaining ROS negative.  Physical Exam: Blood pressure 132/75, pulse 77, temperature 97.6 F (36.4 C), temperature source Oral, resp. rate 20, height 5\' 7"  (1.702 m), weight 244 lb 12.8 oz (111.041 kg). Wt Readings from Last 3 Encounters:  01/19/13 244 lb 12.8 oz (111.041 kg)  12/17/12 246 lb (111.585 kg)  11/10/12 244 lb 12.8 oz (111.041 kg)     General appearance: Well-nourished African American woman HENNT: Pharynx no erythema or exudate Lymph nodes: No lymphadenopathy Breasts: Lungs: Clear to auscultation resonant to percussion Heart: Regular rhythm, no murmur, no gallop, no click Abdomen: Soft, obese, nontender, no mass, no organomegaly Extremities: No edema, no calf tenderness Vascular: No carotid bruits. Carotid pulses 2+ symmetric. No cyanosis Neurologic: Mental status intact, PERRLA, optic discs sharp vessels normal, motor strength 5 over 5, reflexes 1+ symmetric, coordination normal, full EOMs. Skin: No rash or ecchymosis  Lab Results: Lab Results  Component Value Date   WBC 5.5 01/19/2013   HGB  12.4 01/19/2013   HCT 37.9 01/19/2013   MCV 93.9 01/19/2013   PLT 268 01/19/2013     Chemistry      Component Value Date/Time   NA 143 01/19/2013 1126   NA 140 11/04/2012 1550   NA 141 01/14/2012 0900   K 4.0 01/19/2013 1126   K 4.1 11/04/2012 1550   K 4.4 01/14/2012 0900   CL 105 01/19/2013 1126   CL 102  11/04/2012 1550   CL 101 01/14/2012 0900   CO2 28 01/19/2013 1126   CO2 29 11/04/2012 1550   CO2 29 01/14/2012 0900   BUN 21.5 01/19/2013 1126   BUN 17 11/04/2012 1550   BUN 17 01/14/2012 0900   CREATININE 1.0 01/19/2013 1126   CREATININE 0.75 11/04/2012 1550   CREATININE 0.7 01/14/2012 0900      Component Value Date/Time   CALCIUM 9.5 01/19/2013 1126   CALCIUM 9.5 11/04/2012 1550   CALCIUM 8.8 01/14/2012 0900   ALKPHOS 105 01/19/2013 1126   ALKPHOS 94* 01/14/2012 0900   ALKPHOS 91 06/20/2011 1337   AST 16 01/19/2013 1126   AST 20 01/14/2012 0900   AST 15 06/20/2011 1337   ALT 18 01/19/2013 1126   ALT 15 06/20/2011 1337   BILITOT 0.38 01/19/2013 1126   BILITOT 0.50 01/14/2012 0900   BILITOT 0.3 06/20/2011 1337       Radiological Studies: I personally reviewed these images Dg Chest 2 View  01/13/2013  *RADIOLOGY REPORT*  Clinical Data: Lung cancer, COPD, pulmonary fibrosis  CHEST - 2 VIEW  Comparison: 11/08/2012  Findings: Postsurgical changes in the left upper hemithorax.  Underlying chronic interstitial markings/emphysematous changes.  No focal consolidation. No pleural effusion or pneumothorax.  Stable mild cardiomegaly.  Degenerative changes of the visualized thoracolumbar spine.  IMPRESSION: Postsurgical changes in the left upper hemithorax.  Chronic interstitial markings/emphysematous changes.   Original Report Authenticated By: Charline Bills, M.D.     Impression and Plan: 1. Stage IIIB lung cancer treated as outlined above. She remains free of any obvious new disease, and now out over     13 years.  2. 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  3. Type 2 diabetes.  4. Two centimeter proximal gastric submucosal lesion first noted March 2009. Biopsies by endoscopy negative. Stable at time of most recent follow-up endoscopy 07/21/2012.   5. History of spontaneous right pneumothorax October 2010  6. Diverticulosis.    CC:.    Levert Feinstein, MD 4/2/20147:04 PM

## 2013-01-21 ENCOUNTER — Telehealth: Payer: Self-pay | Admitting: Emergency Medicine

## 2013-01-21 NOTE — Telephone Encounter (Signed)
Please call Destanie and thighs her to make an appointment to be seen regarding her elevated blood sugar. I am not sure she may also be seeing  An  endocrinologist and it would be fine to followup with them. If not she needs to followup with me at 104 so we can get her sugar under better control

## 2013-01-21 NOTE — Telephone Encounter (Signed)
Patient advised, she does not have an endocrinologist. She is going to make appt with you. To you FYI

## 2013-06-19 ENCOUNTER — Ambulatory Visit (INDEPENDENT_AMBULATORY_CARE_PROVIDER_SITE_OTHER): Payer: Medicare Other | Admitting: Emergency Medicine

## 2013-06-19 VITALS — BP 124/62 | HR 82 | Temp 98.4°F | Resp 18 | Ht 67.5 in | Wt 240.0 lb

## 2013-06-19 DIAGNOSIS — Z111 Encounter for screening for respiratory tuberculosis: Secondary | ICD-10-CM

## 2013-06-19 NOTE — Progress Notes (Signed)
°  Tuberculosis Risk Questionnaire  1. No Were you born outside the [place] in one of the following parts of the world: [place], [place], [place], [place] or [place]?    2. No Have you traveled outside the [place] and lived for more than one month in one of the following parts of the world: [place], [place], [place], [place] or [place]?    3. Yes  Do you have a compromised immune system such as from any of the following conditions:HIV/AIDS, organ or bone marrow transplantation, diabetes, immunosuppressive medicines (e.g. Prednisone, Remicaide), leukemia, lymphoma, cancer of the head or neck, gastrectomy or jejunal bypass, end-stage renal disease (on dialysis), or silicosis?dm and lung cancer      4. No Have you ever or do you plan on working in: a residential care center, a health care facility, a jail or prison or homeless shelter?    5. No Have you ever: injected illegal drugs, used crack cocaine, lived in a homeless shelter  or been in jail or prison?     6. No Have you ever been exposed to anyone with infectious tuberculosis?    Tuberculosis Symptom Questionnaire  Do you currently have any of the following symptoms?  1. Yes  Unexplained cough lasting more than 3 weeks? Dry cough at night on going   2. No Unexplained fever lasting more than 3 weeks.   3. No Night Sweats (sweating that leaves the bedclothes and sheets wet)     4. Yes  Shortness of Breath lungs issues  5. No Chest Pain   6. No Unintentional weight loss    7. Yes  Unexplained fatigue (very tired for no reason)  Being treated for it

## 2013-06-19 NOTE — Progress Notes (Deleted)
  Subjective:    Patient ID: [name], female    DOB: April 03, 1956, 57 y.o.   MRN: [id number]  HPI patient is going to be done home health. She comes in today for a PPD. She has never had a previous positive PPD    Review of Systems     Objective:   Physical Exam chest is clear heart reveals a 2/6 systolic murmur at the base of the heart the        Assessment & Plan:  Patient cleared for a PPD

## 2013-06-21 ENCOUNTER — Ambulatory Visit (INDEPENDENT_AMBULATORY_CARE_PROVIDER_SITE_OTHER): Payer: Medicare Other | Admitting: Family Medicine

## 2013-06-21 DIAGNOSIS — Z111 Encounter for screening for respiratory tuberculosis: Secondary | ICD-10-CM

## 2013-06-21 LAB — TB SKIN TEST
Induration: 0 mm
TB Skin Test: NEGATIVE

## 2013-07-16 ENCOUNTER — Ambulatory Visit (INDEPENDENT_AMBULATORY_CARE_PROVIDER_SITE_OTHER): Payer: Medicare Other | Admitting: Emergency Medicine

## 2013-07-16 VITALS — BP 122/78 | HR 91 | Temp 98.6°F | Resp 18 | Ht 68.0 in | Wt 240.2 lb

## 2013-07-16 DIAGNOSIS — J329 Chronic sinusitis, unspecified: Secondary | ICD-10-CM

## 2013-07-16 MED ORDER — FLUTICASONE PROPIONATE 50 MCG/ACT NA SUSP: 2.0000 | Freq: Every day | NASAL | Status: DC

## 2013-07-16 MED ORDER — AMOXICILLIN-POT CLAVULANATE 875-125 MG PO TABS: 1.0000 | ORAL_TABLET | Freq: Two times a day (BID) | ORAL | Status: DC

## 2013-07-16 NOTE — Progress Notes (Signed)
  Subjective:    Patient ID: [name], female    DOB: 1956/05/17, 57 y.o.   MRN: [id number]  HPI Pt here c/o of an swollen eyes,redness and sneezing started Wednesday morning. Denies sinus pressure, fever, or runny nose. otc benadryl and ice with no relieve. Review of Systems     Objective:   Physical Exam        Assessment & Plan:

## 2013-07-16 NOTE — Progress Notes (Signed)
  Subjective:    Patient ID: [name], female    DOB: 1956/02/13, 57 y.o.   MRN: [id number]  HPI  57 year old female presents with 3 day history of swelling of her eyelids.  Admits that on 07/12/13 she was in a basement cleaning and was exposed to a lot of dust.  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  The next morning she noticed her eyelids were swelling and she had a small amount of rhinorrhea. Since then has developed right sided facial pain/pressure along with some pain in her gums. Hx of sinus infection "years" ago.  Denies fever, chills, otalgia, sore throat, congestion, cough, SOB, or wheezing.  She has taken OTC Benadryl which has helped only slightly with the swelling.   Patient is otherwise doing well without any other concerns today.     Review of Systems  Constitutional: Negative for fever and chills.  HENT: Positive for rhinorrhea and sinus pressure (pain on right side). Negative for ear pain, congestion and postnasal drip.   Eyes: Positive for itching. Negative for photophobia and visual disturbance.  Respiratory: Negative for cough.   Neurological: Negative for dizziness and headaches.       Objective:   Physical Exam  Constitutional: She is oriented to person, place, and time. She appears well-developed and well-nourished.  HENT:  Head: Normocephalic and atraumatic.  Right Ear: Hearing, tympanic membrane, external ear and ear canal normal.  Left Ear: Hearing, tympanic membrane, external ear and ear canal normal.  Nose: Right sinus exhibits maxillary sinus tenderness. Right sinus exhibits no frontal sinus tenderness. Left sinus exhibits no maxillary sinus tenderness and no frontal sinus tenderness.  Mouth/Throat: Uvula is midline, oropharynx is clear and moist and mucous membranes are normal.  Bilateral  facial swelling over sinuses with associated eyelid swelling  Eyes: Conjunctivae are normal.  Neck: Normal range of motion.  Cardiovascular: Normal rate and regular rhythm.   Murmur heard. Pulmonary/Chest: Effort normal and breath sounds normal.  Neurological: She is alert and oriented to person, place, and time.  Psychiatric: She has a normal mood and affect. Her behavior is normal. Judgment and thought content normal.     Patient seen and examined by Dr. Cleta Alberts who agrees with assessment and plan.      Assessment & Plan:  Sinus infection - Plan: amoxicillin-clavulanate (AUGMENTIN) 875-125 MG per tablet, fluticasone (FLONASE) 50 MCG/ACT nasal spray  Start Augmentin 875 mg bid x 10 days Flonase 2 sprays daily Continue Benadryl 25-50 mg at bedtime Recommend Claritin daily in the morning Follow up if symptoms worsen or fail to improve.

## 2013-07-16 NOTE — Patient Instructions (Signed)
Start Augmentin 875 mg twice daily x 10 days Recommend Claritin daily in the morning May continue Benadryl 25-50 mg at bedtime

## 2013-07-23 ENCOUNTER — Other Ambulatory Visit: Payer: Self-pay

## 2013-07-23 ENCOUNTER — Telehealth: Payer: Self-pay | Admitting: Pulmonary Disease

## 2013-07-23 DIAGNOSIS — Z1231 Encounter for screening mammogram for malignant neoplasm of breast: Secondary | ICD-10-CM

## 2013-07-23 NOTE — Telephone Encounter (Signed)
Per RA. This needs to go through her PCP. He has not seen her since 2012. thanks

## 2013-07-23 NOTE — Telephone Encounter (Signed)
Pt is aware. States that she will come by either today or Monday to pick up the form. Nothing further was needed.

## 2013-07-23 NOTE — Telephone Encounter (Signed)
Pt has not been seen since 07/22/2011 by RA. No pending appt. Please advise Dr. Vassie Loll if this needs to be done through PCP? thanks

## 2013-08-16 ENCOUNTER — Ambulatory Visit
Admission: RE | Admit: 2013-08-16 | Discharge: 2013-08-16 | Disposition: A | Discharge status: Home / Self Care | Payer: Medicare Other | Source: Ambulatory Visit

## 2013-08-16 DIAGNOSIS — Z1231 Encounter for screening mammogram for malignant neoplasm of breast: Secondary | ICD-10-CM

## 2013-08-30 ENCOUNTER — Ambulatory Visit (INDEPENDENT_AMBULATORY_CARE_PROVIDER_SITE_OTHER): Payer: Medicare Other | Admitting: Family Medicine

## 2013-08-30 VITALS — BP 124/70 | HR 97 | Temp 98.2°F | Resp 16 | Ht 68.0 in | Wt 239.0 lb

## 2013-08-30 DIAGNOSIS — N898 Other specified noninflammatory disorders of vagina: Secondary | ICD-10-CM

## 2013-08-30 DIAGNOSIS — L293 Anogenital pruritus, unspecified: Secondary | ICD-10-CM

## 2013-08-30 LAB — POCT WET PREP WITH KOH
Clue Cells Wet Prep HPF POC: NEGATIVE
KOH Prep POC: NEGATIVE
RBC Wet Prep HPF POC: NEGATIVE
Trichomonas, UA: NEGATIVE
WBC Wet Prep HPF POC: NEGATIVE
Yeast Wet Prep HPF POC: NEGATIVE

## 2013-08-30 MED ORDER — FLUCONAZOLE 150 MG PO TABS: 150.0000 mg | ORAL_TABLET | Freq: Once | ORAL | Status: DC

## 2013-08-30 NOTE — Patient Instructions (Signed)
Use the diflucan as directed.  If you are not better please let me know. You can repeat the diflucan in one week if needed.

## 2013-08-30 NOTE — Progress Notes (Signed)
[facility] [address] [place] [address] [phone]  Date:  08/30/2013   Name:  [name]   DOB:  03/17/1956   MRN:  [id number]  PCP:  Lucilla Edin, MD    Chief Complaint: Vaginal Itching   History of Present Illness:  [name] is a 57 y.o. very pleasant female patient who presents with the following:  History of lung cancer, DM, COPD.  She has noted vaginal itching for about a week.    She has tried several OTC vaginal creams; however she has not tried anything for monilia.  She has looked with a mirror and seen redness on her vulva.   She had used a new laundry detergent and a new soap recently.  Not sure if she might be allergic to one of these things.    She has not noted any vaginal discharge.    She notes that her urine hurts her vaginal area but otherwise she does not have any urianry sx  She has had a hysterectomy- total.    She has not been SA in about 2 years.    She does check her sugar at home- this am it was 138.  She may get to 96 but no lower Patient Active Problem List   Diagnosis Date Noted  . 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 Pulmonary fibrosis 01/20/2012  . Pneumothorax, spontaneous, tension 01/20/2012  . Insomnia   . Obesity   . Anemia   . COPD (chronic obstructive pulmonary disease)   . Kidney stones   . AORTIC INSUFFICIENCY 09/05/2010  . UNDIAGNOSED CARDIAC MURMURS 02/24/2009  . NEOP, MALIG, BRONCHUS/LUNG NEC 09/20/2008  . DYSPNEA 09/20/2008  . DIABETES MELLITUS, TYPE II 06/19/2007    Past Medical History  Diagnosis Date  . lung ca dx'd 10/1999    chemo/xrt comp 05/29/2000  . Insomnia   . Nephrolithiasis   . Obesity   . Erythema nodosum   . Aortic insufficiency   . Diabetes mellitus   . Allergy   . Anemia   . COPD (chronic obstructive pulmonary disease)   . Heart murmur   . Myalgia   . Kidney stones   . Aortic regurgitation   . 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 Pulmonary fibrosis 01/20/2012    Due to previous surgery and chest radiation for lung cancer  . Pneumothorax, spontaneous, tension 01/20/2012    October, 2010    Past Surgical History  Procedure Laterality Date  . Chest surgery      bilateral lung  . Shoulder surgery      right  . Inner ear surgery      right   . Abdominal hysterectomy    . Colonoscopy    . Upper gastrointestinal endoscopy    . Total knee arthroplasty      RIGHT  . Carpal tunnel release      RIGHT in 1998/Left hand in 2009  . Pulmonary embolism surgery  2007    LUNG COLLAPSE  . Esophageal dilation    . Knee operation      LEFT LATERAL RELEASE  . Foot surgery      Callous removed /Bil  . Appendectomy      History  Substance Use Topics  . Smoking status: Former Smoker    Quit date: 07/22/1999  . Smokeless  tobacco: Never Used  . Alcohol Use: No    Family History  Problem Relation Age of Onset  . Heart disease Father   . Diabetes Father   . Pancreatic cancer Mother   . Diabetes Mother   . Hypertension Sister     x 3  . Diabetes Sister     x 4    Allergies  Allergen Reactions  . Hydrocodone Hives and Itching  . Morphine And Related Other (See Comments)    GI PROBLEMS  . Nsaids Other (See Comments)    GI PROBLEMS    Medication list has been reviewed and updated.  Current Outpatient Prescriptions on File Prior to Visit  Medication Sig Dispense Refill  . Canagliflozin (INVOKANA) 100 MG TABS Take 100 mg by mouth daily.       [name] glimepiride (AMARYL) 4 MG tablet Take 8 mg by mouth daily before breakfast.      . Multiple Vitamins-Minerals (MULTIVITAMIN GUMMIES ADULT) CHEW Chew 2 tablets by mouth daily.      . sitaGLIPtin (JANUVIA) 100 MG tablet Take 100 mg by mouth daily.        . fluticasone (FLONASE) 50 MCG/ACT nasal spray Place 2 sprays into the nose daily.  16 g  0   No current  facility-administered medications on file prior to visit.    Review of Systems:  As per HPI- otherwise negative.   Physical Examination: Filed Vitals:   08/30/13 1626  BP: 124/70  Pulse: 97  Temp: 98.2 F (36.8 C)  Resp: 16   Filed Vitals:   08/30/13 1626  Height: 5\' 8"  (1.727 m)  Weight: 239 lb (108.41 kg)   Body mass index is 36.35 kg/(m^2). Ideal Body Weight: Weight in (lb) to have BMI = 25: 164.1  GEN: WDWN, NAD, Non-toxic, A & O x 3, obese, looks well HEENT: Atraumatic, Normocephalic. Neck supple. No masses, No LAD. Bilateral TM wnl, oropharynx normal.  PEERL,EOMI.   Ears and Nose: No external deformity. CV: RRR, No M/G/R. No JVD. No thrill. No extra heart sounds. PULM: CTA B, no wheezes, crackles, rhonchi. No retractions. No resp. distress. No accessory muscle use. ABD: S, NT, ND. No rebound. No HSM. EXTR: No c/c/e NEURO Normal gait.  PSYCH: Normally interactive. Conversant. Not depressed or anxious appearing.  Calm demeanor.  GU: vulva appears irritated and slighlty edematous.  Minimal white discharge in the vaginal vault.   S/p hysterectomy, no adnexal tenderness or masses.  Cervix is surgically abscent  Results for orders placed in visit on 08/30/13  POCT WET PREP WITH KOH      Result Value Range   Trichomonas, UA Negative     Clue Cells Wet Prep HPF POC neg     Epithelial Wet Prep HPF POC 1-6     Yeast Wet Prep HPF POC neg     Bacteria Wet Prep HPF POC 1+     RBC Wet Prep HPF POC neg     WBC Wet Prep HPF POC neg     KOH Prep POC Negative      Assessment and Plan: Vaginal itching - Plan: POCT Wet Prep with KOH, fluconazole (DIFLUCAN) 150 MG tablet  Suspect monilia from appearance of vulva.  Will treat with diflucan. She will let me know if not better- Sooner if worse.     Signed Abbe Amsterdam, MD

## 2013-09-02 ENCOUNTER — Telehealth: Payer: Self-pay

## 2013-09-02 NOTE — Telephone Encounter (Signed)
Called her back: she is better but not well.  Advised her that I am not suprised that she still has sx due to her severe monilia.  She can go ahead and take her 2nd diflucan now . If not continuing to get better please let me konw

## 2013-09-02 NOTE — Telephone Encounter (Signed)
She is improving, but still itching/ please advise, I can call her.

## 2013-09-02 NOTE — Telephone Encounter (Signed)
Patient calling because she was told to by Dr. Patsy Lager if she was not better. Patient says she was better than when she was seen on Monday (for vaginal itching)  by her but still says that it has not gone away- itchy and burning. Please advise.   Best: [phone]

## 2013-09-08 ENCOUNTER — Other Ambulatory Visit: Payer: Self-pay | Admitting: Physician Assistant

## 2013-10-21 DIAGNOSIS — N200 Calculus of kidney: Secondary | ICD-10-CM

## 2013-10-21 HISTORY — DX: Calculus of kidney: N20.0

## 2013-10-27 ENCOUNTER — Telehealth: Payer: Self-pay | Admitting: Oncology

## 2013-10-27 NOTE — Telephone Encounter (Signed)
HEARD md LEAVING NEEDED EARLIER APPT...DONE...PT OK AND AWARE

## 2013-11-10 ENCOUNTER — Ambulatory Visit (INDEPENDENT_AMBULATORY_CARE_PROVIDER_SITE_OTHER): Payer: Medicare Other | Admitting: Family Medicine

## 2013-11-10 VITALS — BP 110/62 | HR 87 | Temp 98.1°F | Resp 16 | Ht 67.5 in | Wt 239.0 lb

## 2013-11-10 DIAGNOSIS — L293 Anogenital pruritus, unspecified: Secondary | ICD-10-CM

## 2013-11-10 DIAGNOSIS — N898 Other specified noninflammatory disorders of vagina: Secondary | ICD-10-CM

## 2013-11-10 LAB — POCT WET PREP WITH KOH
Clue Cells Wet Prep HPF POC: NEGATIVE
KOH Prep POC: NEGATIVE
RBC Wet Prep HPF POC: NEGATIVE
Trichomonas, UA: NEGATIVE
Yeast Wet Prep HPF POC: NEGATIVE

## 2013-11-10 MED ORDER — FLUCONAZOLE 150 MG PO TABS: 150.0000 mg | ORAL_TABLET | Freq: Once | ORAL | Status: DC

## 2013-11-10 NOTE — Patient Instructions (Signed)
You make take diflucan once a week as needed.  Let me know if you are not feeling better in the next few days- Sooner if worse.

## 2013-11-10 NOTE — Progress Notes (Signed)
[facility] [address], [place] [address] [phone]  Date:  11/10/2013   Name:  [name]   DOB:  [birth year]   MRN:  [id number]  PCP:  Jenny Reichmann, MD    Chief Complaint: Vaginitis   History of Present Illness:  [name] is a 58 y.o. very pleasant female patient who presents with the following:  She was seen here in November with likely monilia. She took #3 diflucan over the course of a few weeks- she did get well in between.  About 2 weeks ago her sx returned.  She has been scratching some. She is letting water run over it when she is in the shower because her sx are really bothering her. She does not have any urinary sx, discharge, abdominal pain or fever.   She does have a history of DM- she started insulin not long ago. She sees Dr. [name].    She is s/p hysterectomy due to fibroids- no history of cancer.  She is not currently SA per her report   Patient Active Problem List   Diagnosis Date Noted  . 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 Pulmonary fibrosis 01/20/2012  . Pneumothorax, spontaneous, tension 01/20/2012  . Insomnia   . Obesity   . Anemia   . COPD (chronic obstructive pulmonary disease)   . Kidney stones   . AORTIC INSUFFICIENCY 09/05/2010  . UNDIAGNOSED CARDIAC MURMURS 02/24/2009  . NEOP, MALIG, [place] NEC 09/20/2008  . DYSPNEA 09/20/2008  . DIABETES MELLITUS, TYPE II 06/19/2007    Past Medical History  Diagnosis Date  . lung ca dx'd 10/1999    chemo/xrt comp 05/29/2000  . Insomnia   . Nephrolithiasis   . Obesity   . Erythema nodosum   . Aortic insufficiency   . Diabetes mellitus   . Allergy   . Anemia   . COPD (chronic obstructive pulmonary disease)   . Heart murmur   . Myalgia   . Kidney stones   . Aortic regurgitation   . 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 Pulmonary fibrosis  01/20/2012    Due to previous surgery and chest radiation for lung cancer  . Pneumothorax, spontaneous, tension 01/20/2012    October, 2010    Past Surgical History  Procedure Laterality Date  . Chest surgery      bilateral lung  . Shoulder surgery      right  . Inner ear surgery      right   . Abdominal hysterectomy    . Colonoscopy    . Upper gastrointestinal endoscopy    . Total knee arthroplasty      RIGHT  . Carpal tunnel release      RIGHT in 1998/Left hand in 2009  . Pulmonary embolism surgery  2007    LUNG COLLAPSE  . Esophageal dilation    . Knee operation      LEFT LATERAL RELEASE  . Foot surgery      Callous removed /Bil  . Appendectomy      History  Substance Use Topics  . Smoking status: Former Smoker    Quit date: 07/22/1999  . Smokeless tobacco: Never Used  . Alcohol Use: No    Family History  Problem Relation Age of Onset  . Heart disease Father   . Diabetes Father   . Pancreatic  cancer Mother   . Diabetes Mother   . Hypertension Sister     x 3  . Diabetes Sister     x 4    Allergies  Allergen Reactions  . Hydrocodone Hives and Itching  . Morphine And Related Other (See Comments)    GI PROBLEMS  . Nsaids Other (See Comments)    GI PROBLEMS    Medication list has been reviewed and updated.  Current Outpatient Prescriptions on File Prior to Visit  Medication Sig Dispense Refill  . Canagliflozin (INVOKANA) 100 MG TABS Take 100 mg by mouth daily.       . Multiple Vitamins-Minerals (MULTIVITAMIN GUMMIES ADULT) CHEW Chew 2 tablets by mouth daily.      . sitaGLIPtin (JANUVIA) 100 MG tablet Take 100 mg by mouth daily.        . fluconazole (DIFLUCAN) 150 MG tablet Take 1 tablet (150 mg total) by mouth once.  2 tablet  0  . fluticasone (FLONASE) 50 MCG/ACT nasal spray PLACE 2 SPRAYS INTO THE NOSE DAILY.  16 g  3  . glimepiride (AMARYL) 4 MG tablet Take 8 mg by mouth daily before breakfast.       No current facility-administered medications on  file prior to visit.    Review of Systems:  As per HPI- otherwise negative.   Physical Examination: Filed Vitals:   11/10/13 1608  BP: 110/62  Pulse: 87  Temp: 98.1 F (36.7 C)  Resp: 16   Filed Vitals:   11/10/13 1608  Height: 5' 7.5" (1.715 m)  Weight: 239 lb (108.41 kg)   Body mass index is 36.86 kg/(m^2). Ideal Body Weight: Weight in (lb) to have BMI = 25: 161.7  GEN: WDWN, NAD, Non-toxic, A & O x 3, obese, looks well HEENT: Atraumatic, Normocephalic. Neck supple. No masses, No LAD. Ears and Nose: No external deformity. CV: RRR, No M/G/R. No JVD. No thrill. No extra heart sounds. PULM: CTA B, no wheezes, crackles, rhonchi. No retractions. No resp. distress. No accessory muscle use. ABD: S, NT, ND EXTR: No c/c/e NEURO Normal gait.  PSYCH: Normally interactive. Conversant. Not depressed or anxious appearing.  Calm demeanor.  GU: slight excoriation and edema of external genitals but not nearly as bad as in November.  Normal vaginal canal  Results for orders placed in visit on 11/10/13  POCT WET PREP WITH KOH      Result Value Range   Trichomonas, UA Negative     Clue Cells Wet Prep HPF POC neg     Epithelial Wet Prep HPF POC 0-1     Yeast Wet Prep HPF POC neg     Bacteria Wet Prep HPF POC 2+     RBC Wet Prep HPF POC neg     WBC Wet Prep HPF POC 0-1     KOH Prep POC Negative      Assessment and Plan: Vaginal itching - Plan: POCT Wet Prep with KOH, GC/Chlamydia Probe Amp, fluconazole (DIFLUCAN) 150 MG tablet  Persistent vaginal itching.  Treat with diflucan; she may take one a week as needed for her symptoms.  Let me know if not better in the next few days- Sooner if worse.     Signed Lamar Blinks, MD

## 2013-11-11 LAB — GC/CHLAMYDIA PROBE AMP
CT Probe RNA: NEGATIVE
GC Probe RNA: NEGATIVE

## 2013-11-25 ENCOUNTER — Ambulatory Visit: Payer: Medicare Other

## 2013-11-25 ENCOUNTER — Ambulatory Visit (INDEPENDENT_AMBULATORY_CARE_PROVIDER_SITE_OTHER): Payer: Medicare Other | Admitting: Emergency Medicine

## 2013-11-25 VITALS — BP 130/60 | HR 75 | Temp 98.0°F | Resp 18 | Ht 68.0 in | Wt 242.0 lb

## 2013-11-25 DIAGNOSIS — M79644 Pain in right finger(s): Secondary | ICD-10-CM

## 2013-11-25 DIAGNOSIS — M79609 Pain in unspecified limb: Secondary | ICD-10-CM
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
## 2013-12-13 ENCOUNTER — Telehealth: Payer: Self-pay | Admitting: Oncology

## 2013-12-13 NOTE — Telephone Encounter (Signed)
Called pt and left message regarding r/s appt to 3/20 due to MD's call day , mailed appt to pt

## 2013-12-15 ENCOUNTER — Telehealth: Payer: Self-pay | Admitting: Oncology

## 2013-12-15 NOTE — Telephone Encounter (Signed)
Pt called and wants another appt time for 3/20, emailed MD, waiting for appt

## 2013-12-18 ENCOUNTER — Telehealth: Payer: Self-pay | Admitting: *Deleted

## 2013-12-18 ENCOUNTER — Encounter: Payer: Self-pay | Admitting: Oncology

## 2013-12-18 NOTE — Telephone Encounter (Signed)
Mailed provider change letter w/ calendar to pt and cancelled Dr. Synthia Innocent appt.

## 2013-12-31 ENCOUNTER — Other Ambulatory Visit: Payer: Medicare Other

## 2013-12-31 ENCOUNTER — Ambulatory Visit: Payer: Medicare Other | Admitting: Oncology

## 2014-01-03 ENCOUNTER — Encounter: Payer: Self-pay | Admitting: Internal Medicine

## 2014-01-03 ENCOUNTER — Ambulatory Visit (INDEPENDENT_AMBULATORY_CARE_PROVIDER_SITE_OTHER): Payer: Medicare Other | Admitting: Family Medicine

## 2014-01-03 ENCOUNTER — Ambulatory Visit (INDEPENDENT_AMBULATORY_CARE_PROVIDER_SITE_OTHER): Payer: Medicare Other | Admitting: Internal Medicine

## 2014-01-03 VITALS — BP 122/62 | HR 82 | Temp 98.3°F | Resp 18 | Wt 240.0 lb

## 2014-01-03 VITALS — BP 137/70 | HR 84 | Ht 68.0 in | Wt 239.0 lb

## 2014-01-03 DIAGNOSIS — I359 Nonrheumatic aortic valve disorder, unspecified: Secondary | ICD-10-CM

## 2014-01-03 DIAGNOSIS — R109 Unspecified abdominal pain: Secondary | ICD-10-CM

## 2014-01-03 DIAGNOSIS — R300 Dysuria: Secondary | ICD-10-CM

## 2014-01-03 DIAGNOSIS — R0602 Shortness of breath: Secondary | ICD-10-CM

## 2014-01-03 DIAGNOSIS — D509 Iron deficiency anemia, unspecified: Secondary | ICD-10-CM

## 2014-01-03 LAB — POCT WET PREP WITH KOH
KOH Prep POC: NEGATIVE
RBC Wet Prep HPF POC: NEGATIVE
Trichomonas, UA: NEGATIVE
WBC Wet Prep HPF POC: NEGATIVE
Yeast Wet Prep HPF POC: NEGATIVE

## 2014-01-03 LAB — POCT UA - MICROSCOPIC ONLY
Casts, Ur, LPF, POC: NEGATIVE
Crystals, Ur, HPF, POC: NEGATIVE
Mucus, UA: NEGATIVE
RBC, urine, microscopic: NEGATIVE
Yeast, UA: NEGATIVE

## 2014-01-03 LAB — POCT URINALYSIS DIPSTICK
Bilirubin, UA: NEGATIVE
Blood, UA: NEGATIVE
Glucose, UA: NEGATIVE
Ketones, UA: NEGATIVE
Leukocytes, UA: NEGATIVE
Nitrite, UA: NEGATIVE
Protein, UA: NEGATIVE
Spec Grav, UA: >=1.03
Urobilinogen, UA: 0.2
pH, UA: 5

## 2014-01-03 LAB — POCT CBC
Granulocyte percent: 61 %G (ref 37–80)
HCT, POC: 37.2 % — AB (ref 37.7–47.9)
Hemoglobin: 11.4 g/dL — AB (ref 12.2–16.2)
Lymph, poc: 1.9 (ref 0.6–3.4)
MCH, POC: 29.9 pg (ref 27–31.2)
MCHC: 30.6 g/dL — AB (ref 31.8–35.4)
MCV: 97.6 fL — AB (ref 80–97)
MID (cbc): 0.4 (ref 0–0.9)
MPV: 8 fL (ref 0–99.8)
POC Granulocyte: 3.7 (ref 2–6.9)
POC LYMPH PERCENT: 31.5 %L (ref 10–50)
POC MID %: 7.5 %M (ref 0–12)
Platelet Count, POC: 253 10*3/uL (ref 142–424)
RBC: 3.81 M/uL — AB (ref 4.04–5.48)
RDW, POC: 13.1 %
WBC: 6 10*3/uL (ref 4.6–10.2)

## 2014-01-03 MED ORDER — FLUCONAZOLE 150 MG PO TABS: 150.0000 mg | ORAL_TABLET | Freq: Every day | ORAL | Status: DC

## 2014-01-03 NOTE — Progress Notes (Signed)
[facility] [address] [place] [address] [phone]  Date:  01/03/2014   Name:  [name]   DOB:  May 29, 1956   MRN:  [id number]  PCP:  Jenny Reichmann, MD    Chief Complaint: Urinary Tract Infection   History of Present Illness:  [name] is a 58 y.o. very pleasant female patient who presents with the following:  Here today with a possible UTI.   She has noted sx for 3 weeks- she has "burning" with urination.  She does not have to go too frequently and does not have urgency.   She has not noted blood in her urine.  Her urine does smell somewhat She has noted a sore back but this seems to be unrelated. Thought due to standing for a very long time.   No new sexual partners, no recent intercourse  No nausea, vomiting or abdominal pain She is using water and cranberry juice.    She has not noted any vaginal discharge.   Patient Active Problem List   Diagnosis Date Noted  . 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 Pulmonary fibrosis 01/20/2012  . Pneumothorax, spontaneous, tension 01/20/2012  . Insomnia   . Obesity   . Anemia   . COPD (chronic obstructive pulmonary disease)   . Kidney stones   . AORTIC INSUFFICIENCY 09/05/2010  . UNDIAGNOSED CARDIAC MURMURS 02/24/2009  . NEOP, MALIG, [place] NEC 09/20/2008  . DYSPNEA 09/20/2008  . DIABETES MELLITUS, TYPE II 06/19/2007    Past Medical History  Diagnosis Date  . lung ca dx'd 10/1999    chemo/xrt comp 05/29/2000  . Insomnia   . Nephrolithiasis   . Obesity   . Erythema nodosum   . Aortic insufficiency   . Diabetes mellitus   . Allergy   . Anemia   . COPD (chronic obstructive pulmonary disease)   . Heart murmur   . Myalgia   . Kidney stones   . Aortic regurgitation   . 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 Pulmonary fibrosis 01/20/2012    Due to previous surgery  and chest radiation for lung cancer  . Pneumothorax, spontaneous, tension 01/20/2012    October, 2010    Past Surgical History  Procedure Laterality Date  . Chest surgery      bilateral lung  . Shoulder surgery      right  . Inner ear surgery      right   . Abdominal hysterectomy    . Colonoscopy    . Upper gastrointestinal endoscopy    . Total knee arthroplasty      RIGHT  . Carpal tunnel release      RIGHT in 1998/Left hand in 2009  . Pulmonary embolism surgery  2007    LUNG COLLAPSE  . Esophageal dilation    . Knee operation      LEFT LATERAL RELEASE  . Foot surgery      Callous removed /Bil  . Appendectomy      History  Substance Use Topics  . Smoking status: Former Smoker    Quit date: 07/22/1999  . Smokeless tobacco: Never Used  . Alcohol Use: No    Family History  Problem Relation Age of Onset  . Heart disease Father   . Diabetes Father   . Pancreatic cancer Mother   . Diabetes Mother   .  Hypertension Sister     x 3  . Diabetes Sister     x 4    Allergies  Allergen Reactions  . Hydrocodone Hives and Itching  . Morphine And Related Other (See Comments)    GI PROBLEMS  . Nsaids Other (See Comments)    GI PROBLEMS    Medication list has been reviewed and updated.  Current Outpatient Prescriptions on File Prior to Visit  Medication Sig Dispense Refill  . insulin aspart protamine- aspart (NOVOLOG MIX 70/30) (70-30) 100 UNIT/ML injection Inject into the skin.      . Multiple Vitamins-Minerals (MULTIVITAMIN GUMMIES ADULT) CHEW Chew 2 tablets by mouth daily.      . sitaGLIPtin (JANUVIA) 100 MG tablet Take 100 mg by mouth daily.         No current facility-administered medications on file prior to visit.    Review of Systems:  As per HPI- otherwise negative.   Physical Examination: Filed Vitals:   01/03/14 1600  BP: 122/62  Pulse: 82  Temp: 98.3 F (36.8 C)  Resp: 18   Filed Vitals:   01/03/14 1600  Weight: 240 lb (108.863 kg)   Body  mass index is 36.5 kg/(m^2). Ideal Body Weight:    GEN: WDWN, NAD, Non-toxic, A & O x 3, obese, looks well HEENT: Atraumatic, Normocephalic. Neck supple. No masses, No LAD. Ears and Nose: No external deformity. CV: RRR, No M/G/R. No JVD. No thrill. No extra heart sounds. PULM: CTA B, no wheezes, crackles, rhonchi. No retractions. No resp. distress. No accessory muscle use. ABD: S, NT, ND, +BS. No rebound. No HSM. EXTR: No c/c/e NEURO Normal gait.  PSYCH: Normally interactive. Conversant. Not depressed or anxious appearing.  Calm demeanor.  GU: normal exam except she does have some thick, white discharge in the vaginal canal.   S/p hysterectomy, no adnexal tenderness or masses   Results for orders placed in visit on 01/03/14  POCT UA - MICROSCOPIC ONLY      Result Value Ref Range   WBC, Ur, HPF, POC 0-1     RBC, urine, microscopic neg     Bacteria, U Microscopic trace     Mucus, UA neg     Epithelial cells, urine per micros 1-3     Crystals, Ur, HPF, POC neg     Casts, Ur, LPF, POC neg     Yeast, UA neg    POCT URINALYSIS DIPSTICK      Result Value Ref Range   Color, UA yellow     Clarity, UA clear     Glucose, UA neg     Bilirubin, UA neg     Ketones, UA neg     Spec Grav, UA >=1.030     Blood, UA neg     pH, UA 5.0     Protein, UA neg     Urobilinogen, UA 0.2     Nitrite, UA neg     Leukocytes, UA Negative    POCT CBC      Result Value Ref Range   WBC 6.0  4.6 - 10.2 K/uL   Lymph, poc 1.9  0.6 - 3.4   POC LYMPH PERCENT 31.5  10 - 50 %L   MID (cbc) 0.4  0 - 0.9   POC MID % 7.5  0 - 12 %M   POC Granulocyte 3.7  2 - 6.9   Granulocyte percent 61.0  37 - 80 %G   RBC 3.81 (*) 4.04 - 5.48  M/uL   Hemoglobin 11.4 (*) 12.2 - 16.2 g/dL   HCT, POC 37.2 (*) 37.7 - 47.9 %   MCV 97.6 (*) 80 - 97 fL   MCH, POC 29.9  27 - 31.2 pg   MCHC 30.6 (*) 31.8 - 35.4 g/dL   RDW, POC 13.1     Platelet Count, POC 253  142 - 424 K/uL   MPV 8.0  0 - 99.8 fL  POCT WET PREP WITH KOH       Result Value Ref Range   Trichomonas, UA Negative     Clue Cells Wet Prep HPF POC 0-2     Epithelial Wet Prep HPF POC 5-12     Yeast Wet Prep HPF POC neg     Bacteria Wet Prep HPF POC 1+     RBC Wet Prep HPF POC neg     WBC Wet Prep HPF POC neg     KOH Prep POC Negative      Assessment and Plan: Dysuria - Plan: POCT UA - Microscopic Only, POCT urinalysis dipstick, POCT CBC, Urine culture, POCT Wet Prep with KOH, fluconazole (DIFLUCAN) 150 MG tablet  Abdominal pain, unspecified site - Plan: POCT CBC  At this time do not see evidence of a UTI.  Await urine culture, but monilia seems more likely.  She has DM which is a risk factor.  Will treat with diflucan, and await her urine culture.  She will let me know if getting worse in any way  Mild anemia: follow-up with urine culture results.    Signed Lamar Blinks, MD

## 2014-01-03 NOTE — Patient Instructions (Signed)
Your physician recommends that you have a LIPID PANEL drawn at your next visit to cancer center for blood work.  Your physician recommends that you continue on your current medications as directed. Please refer to the Current Medication list given to you today.  Dr. Harrington Challenger will be in touch with you after reviewing blood work results and pulmonary function tests.

## 2014-01-03 NOTE — Progress Notes (Signed)
HPI Patient is a 58 yo who has a history of AI  Also history of remote lung CA  She was seen recently by Tommas Olp in clinic   Had been seen in ER prior for CP that was felt to be more musculoskeletal.  Had neg myoview in June 2013.  Also had complaints of palpitations.  History of syncope in Jan  Occurred while getting ready for church She was set up for event monitor  This showed no signif arrhythmia  Patient with chronic SOB  No real change Occasional sparks/shocks in chest.  Last seconds   One episode of dizziness while walking up stairs  No syncope  11/14  Allergies  Allergen Reactions  . Hydrocodone Hives and Itching  . Morphine And Related Other (See Comments)    GI PROBLEMS  . Nsaids Other (See Comments)    GI PROBLEMS    Current Outpatient Prescriptions  Medication Sig Dispense Refill  . insulin aspart protamine- aspart (NOVOLOG MIX 70/30) (70-30) 100 UNIT/ML injection Inject into the skin.      . Multiple Vitamins-Minerals (MULTIVITAMIN GUMMIES ADULT) CHEW Chew 2 tablets by mouth daily.      . sitaGLIPtin (JANUVIA) 100 MG tablet Take 100 mg by mouth daily.         No current facility-administered medications for this visit.    Past Medical History  Diagnosis Date  . lung ca dx'd 10/1999    chemo/xrt comp 05/29/2000  . Insomnia   . Nephrolithiasis   . Obesity   . Erythema nodosum   . Aortic insufficiency   . Diabetes mellitus   . Allergy   . Anemia   . COPD (chronic obstructive pulmonary disease)   . Heart murmur   . Myalgia   . Kidney stones   . Aortic regurgitation   . 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 Pulmonary fibrosis 01/20/2012    Due to previous surgery and chest radiation for lung cancer  . Pneumothorax, spontaneous, tension 01/20/2012    October, 2010    Past Surgical History  Procedure Laterality Date  . Chest surgery      bilateral  lung  . Shoulder surgery      right  . Inner ear surgery      right   . Abdominal hysterectomy    . Colonoscopy    . Upper gastrointestinal endoscopy    . Total knee arthroplasty      RIGHT  . Carpal tunnel release      RIGHT in 1998/Left hand in 2009  . Pulmonary embolism surgery  2007    LUNG COLLAPSE  . Esophageal dilation    . Knee operation      LEFT LATERAL RELEASE  . Foot surgery      Callous removed /Bil  . Appendectomy      Family History  Problem Relation Age of Onset  . Heart disease Father   . Diabetes Father   . Pancreatic cancer Mother   . Diabetes Mother   . Hypertension Sister     x 3  . Diabetes Sister     x 4    History   Social History  . Marital Status: Single    Spouse Name: N/A    Number of Children: 0  . Years of Education: N/A   Occupational History  . custodian    Social History  Main Topics  . Smoking status: Former Smoker    Quit date: 07/22/1999  . Smokeless tobacco: Never Used  . Alcohol Use: No  . Drug Use: No  . Sexual Activity: Not on file   Other Topics Concern  . Not on file   Social History Narrative  . No narrative on file    Review of Systems:  All systems reviewed.  They are negative to the above problem except as previously stated.  Vital Signs: BP 137/70  Pulse 84  Ht 5\' 8"  (1.727 m)  Wt 239 lb (108.41 kg)  BMI 36.35 kg/m2  Physical Exam  HEENT:  Normocephalic, atraumatic. EOMI, PERRLA.  Neck: JVP is normal.  No bruits.  Lungs: clear to auscultation. No rales no wheezes.  Heart: Regular rate and rhythm. Normal S1, S2. No S3.  III/VI sys  Murmur.I/VI diastolic PMI not displaced.  Abdomen:  Supple, nontender. Normal bowel sounds. No masses. No hepatomegaly.  Extremities:   Good distal pulses throughout. No lower extremity edema.  Musculoskeletal :moving all extremities.  Neuro:   alert and oriented x3.  CN II-XII grossly intact. EKG  SR 84  Assessment and Plan:  1.  SOB  Chronic  Not with walking  but when tries to increase or to push herself  Wants to do more  Will review PFTs with pulm   I am not convinced cardiac  Will contact patinet after review  2.  Palpitations.  Denies    3.  AI  MIld to mod on echo. Murmur  unchanged F/u in 1 year clinically

## 2014-01-03 NOTE — Patient Instructions (Signed)
I will let you know your urine culture results.  For the time being use diflucan once a week as needed for burning with urination.

## 2014-01-04 LAB — URINE CULTURE: Colony Count: 30000

## 2014-01-05 ENCOUNTER — Encounter: Payer: Self-pay | Admitting: Family Medicine

## 2014-01-07 ENCOUNTER — Ambulatory Visit: Payer: Medicare Other | Admitting: Oncology

## 2014-01-07 ENCOUNTER — Other Ambulatory Visit: Payer: Medicare Other

## 2014-01-08 ENCOUNTER — Encounter: Payer: Self-pay | Admitting: Family Medicine

## 2014-01-08 NOTE — Addendum Note (Signed)
Addended by: Lamar Blinks C on: 01/08/2014 06:59 AM   Modules accepted: Orders

## 2014-01-10 ENCOUNTER — Telehealth: Payer: Self-pay | Admitting: Internal Medicine

## 2014-01-10 ENCOUNTER — Other Ambulatory Visit (HOSPITAL_BASED_OUTPATIENT_CLINIC_OR_DEPARTMENT_OTHER): Payer: Medicare Other

## 2014-01-10 ENCOUNTER — Ambulatory Visit (HOSPITAL_BASED_OUTPATIENT_CLINIC_OR_DEPARTMENT_OTHER): Payer: Medicare Other | Admitting: Internal Medicine

## 2014-01-10 ENCOUNTER — Encounter: Payer: Self-pay | Admitting: Internal Medicine

## 2014-01-10 VITALS — BP 135/71 | HR 93 | Temp 97.5°F | Resp 18 | Ht 68.0 in | Wt 237.7 lb

## 2014-01-10 DIAGNOSIS — Z85118 Personal history of other malignant neoplasm of bronchus and lung: Secondary | ICD-10-CM

## 2014-01-10 DIAGNOSIS — J8410 Pulmonary fibrosis, unspecified: Secondary | ICD-10-CM

## 2014-01-10 DIAGNOSIS — C3490 Malignant neoplasm of unspecified part of unspecified bronchus or lung: Secondary | ICD-10-CM

## 2014-01-10 DIAGNOSIS — J449 Chronic obstructive pulmonary disease, unspecified: Secondary | ICD-10-CM

## 2014-01-10 DIAGNOSIS — R0602 Shortness of breath: Secondary | ICD-10-CM

## 2014-01-10 LAB — COMPREHENSIVE METABOLIC PANEL (CC13)
ALT: 13 U/L (ref 0–55)
AST: 14 U/L (ref 5–34)
Albumin: 3.6 g/dL (ref 3.5–5.0)
Alkaline Phosphatase: 106 U/L (ref 40–150)
Anion Gap: 9 mEq/L (ref 3–11)
BUN: 17.7 mg/dL (ref 7.0–26.0)
CO2: 25 mEq/L (ref 22–29)
Calcium: 9.3 mg/dL (ref 8.4–10.4)
Chloride: 107 mEq/L (ref 98–109)
Creatinine: 0.8 mg/dL (ref 0.6–1.1)
Glucose: 210 mg/dl — ABNORMAL HIGH (ref 70–140)
Potassium: 3.6 mEq/L (ref 3.5–5.1)
Sodium: 142 mEq/L (ref 136–145)
Total Bilirubin: 0.39 mg/dL (ref 0.20–1.20)
Total Protein: 7.4 g/dL (ref 6.4–8.3)

## 2014-01-10 LAB — CBC WITH DIFFERENTIAL/PLATELET
BASO%: 0.5 % (ref 0.0–2.0)
Basophils Absolute: 0 10*3/uL (ref 0.0–0.1)
EOS%: 5.2 % (ref 0.0–7.0)
Eosinophils Absolute: 0.4 10*3/uL (ref 0.0–0.5)
HCT: 37.6 % (ref 34.8–46.6)
HGB: 12.4 g/dL (ref 11.6–15.9)
LYMPH%: 23.6 % (ref 14.0–49.7)
MCH: 31.1 pg (ref 25.1–34.0)
MCHC: 32.9 g/dL (ref 31.5–36.0)
MCV: 94.5 fL (ref 79.5–101.0)
MONO#: 0.5 10*3/uL (ref 0.1–0.9)
MONO%: 6.3 % (ref 0.0–14.0)
NEUT#: 4.9 10*3/uL (ref 1.5–6.5)
NEUT%: 64.4 % (ref 38.4–76.8)
Platelets: 268 10*3/uL (ref 145–400)
RBC: 3.98 10*6/uL (ref 3.70–5.45)
RDW: 13 % (ref 11.2–14.5)
WBC: 7.6 10*3/uL (ref 3.9–10.3)
lymph#: 1.8 10*3/uL (ref 0.9–3.3)

## 2014-01-10 NOTE — Telephone Encounter (Signed)
New message     In the office on Tuesday . Was told that Dr. Harrington Challenger will be getting back with her. Patient stated she has not heard anything .

## 2014-01-10 NOTE — Telephone Encounter (Signed)
gv and printed appt sched and avs forpt for March 2016 °

## 2014-01-10 NOTE — Progress Notes (Signed)
[place] [phone]   [phone]  OFFICE PROGRESS NOTE  Jenny Reichmann, MD [place] [address]  DIAGNOSIS: Stage IIIB (T4, N2, M0) non-small cell lung cancer with squamoid differentiation diagnosed in December of 2000.  PRIOR THERAPY:  1) status post left upper lobectomy with lymph node dissection under the care of Dr. Arlyce Dice on 10/25/1999. 2) status post a course of concurrent chemoradiation under the care of Dr. Beryle Beams completed in 2001  CURRENT THERAPY: Observation.  INTERVAL HISTORY: [name] 58 y.o. female returns to the clinic today for followup visit accompanied by her sister. She is a former patient of Dr. Beryle Beams who is here today for evaluation and to establish care with me after he left the practice. The patient is feeling fine today with no specific complaints. She was diagnosed with a stage IIIB non-small cell lung cancer and 2000 status post left upper lobectomy with lymph node dissection followed by concurrent chemoradiation. She has been doing fine for the last 14 years with no evidence of disease recurrence. She was last seen by Dr. Beryle Beams on 01/19/2013. She denied having any significant chest pain, shortness breath, cough or hemoptysis. The patient denied having any weight loss or night sweats. She has no nausea or vomiting.  MEDICAL HISTORY: Past Medical History  Diagnosis Date  . lung ca dx'd 10/1999    chemo/xrt comp 05/29/2000  . Insomnia   . Nephrolithiasis   . Obesity   . Erythema nodosum   . Aortic insufficiency   . Diabetes mellitus   . Allergy   . Anemia   . COPD (chronic obstructive pulmonary disease)   . Heart murmur   . Myalgia   . Kidney stones   . Aortic regurgitation   . 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 Pulmonary fibrosis 01/20/2012    Due to previous  surgery and chest radiation for lung cancer  . Pneumothorax, spontaneous, tension 01/20/2012    October, 2010    ALLERGIES:  is allergic to hydrocodone; morphine and related; and nsaids.  MEDICATIONS:  Current Outpatient Prescriptions  Medication Sig Dispense Refill  . fluconazole (DIFLUCAN) 150 MG tablet Take 1 tablet (150 mg total) by mouth daily. Repeat weekly as needed  4 tablet  0  . insulin aspart protamine- aspart (NOVOLOG MIX 70/30) (70-30) 100 UNIT/ML injection Inject into the skin.      . Multiple Vitamins-Minerals (MULTIVITAMIN GUMMIES ADULT) CHEW Chew 2 tablets by mouth daily.      . sitaGLIPtin (JANUVIA) 100 MG tablet Take 100 mg by mouth daily.         No current facility-administered medications for this visit.    SURGICAL HISTORY:  Past Surgical History  Procedure Laterality Date  . Chest surgery      bilateral lung  . Shoulder surgery      right  . Inner ear surgery      right   . Abdominal hysterectomy    . Colonoscopy    . Upper gastrointestinal endoscopy    . Total knee arthroplasty      RIGHT  . Carpal tunnel release      RIGHT in 1998/Left hand in 2009  . Pulmonary embolism surgery  2007    LUNG COLLAPSE  . Esophageal dilation    . Knee operation  LEFT LATERAL RELEASE  . Foot surgery      Callous removed /Bil  . Appendectomy      REVIEW OF SYSTEMS:  Constitutional: positive for fatigue Eyes: negative Ears, nose, mouth, throat, and face: negative Respiratory: negative Cardiovascular: negative Gastrointestinal: negative Genitourinary:negative Integument/breast: negative Hematologic/lymphatic: negative Musculoskeletal:negative Neurological: negative Behavioral/Psych: negative Endocrine: negative Allergic/Immunologic: negative   PHYSICAL EXAMINATION: General appearance: alert, cooperative, fatigued and no distress Head: Normocephalic, without obvious abnormality, atraumatic Neck: no adenopathy, no JVD, supple, symmetrical, trachea  midline and thyroid not enlarged, symmetric, no tenderness/mass/nodules Lymph nodes: Cervical, supraclavicular, and axillary nodes normal. Resp: clear to auscultation bilaterally Back: symmetric, no curvature. ROM normal. No CVA tenderness. Cardio: diastolic murmur: holodiastolic 2/6, decrescendo at apex GI: soft, non-tender; bowel sounds normal; no masses,  no organomegaly Extremities: extremities normal, atraumatic, no cyanosis or edema Neurologic: Alert and oriented X 3, normal strength and tone. Normal symmetric reflexes. Normal coordination and gait  ECOG PERFORMANCE STATUS: 1 - Symptomatic but completely ambulatory  Blood pressure 135/71, pulse 93, temperature 97.5 F (36.4 C), temperature source Oral, resp. rate 18, height 5\' 8"  (1.727 m), weight 237 lb 11.2 oz (107.82 kg).  LABORATORY DATA: Lab Results  Component Value Date   WBC 7.6 01/10/2014   HGB 12.4 01/10/2014   HCT 37.6 01/10/2014   MCV 94.5 01/10/2014   PLT 268 01/10/2014      Chemistry      Component Value Date/Time   NA 143 01/19/2013 1126   NA 140 11/04/2012 1550   NA 141 01/14/2012 0900   K 4.0 01/19/2013 1126   K 4.1 11/04/2012 1550   K 4.4 01/14/2012 0900   CL 105 01/19/2013 1126   CL 102 11/04/2012 1550   CL 101 01/14/2012 0900   CO2 28 01/19/2013 1126   CO2 29 11/04/2012 1550   CO2 29 01/14/2012 0900   BUN 21.5 01/19/2013 1126   BUN 17 11/04/2012 1550   BUN 17 01/14/2012 0900   CREATININE 1.0 01/19/2013 1126   CREATININE 0.75 11/04/2012 1550   CREATININE 0.7 01/14/2012 0900      Component Value Date/Time   CALCIUM 9.5 01/19/2013 1126   CALCIUM 9.5 11/04/2012 1550   CALCIUM 8.8 01/14/2012 0900   ALKPHOS 105 01/19/2013 1126   ALKPHOS 94* 01/14/2012 0900   ALKPHOS 91 06/20/2011 1337   AST 16 01/19/2013 1126   AST 20 01/14/2012 0900   AST 15 06/20/2011 1337   ALT 18 01/19/2013 1126   ALT 22 01/14/2012 0900   ALT 15 06/20/2011 1337   BILITOT 0.38 01/19/2013 1126   BILITOT 0.50 01/14/2012 0900   BILITOT 0.3 06/20/2011 1337        RADIOGRAPHIC STUDIES: No results found.  ASSESSMENT AND PLAN: This is a very pleasant 58 years old with a stage IIIB non-small cell lung cancer status post left upper lobectomy with lymph node dissection followed by concurrent chemoradiation diagnosed in December of 2000. The patient has been on observation for the last 15 years with no evidence of disease recurrence. I have a lengthy discussion with the patient today about her current disease status. I recommended for her to continue on observation with repeat CT scan of the chest in 1 year for reevaluation of her disease. She was advised to call me immediately if she has any concerning symptoms in the interval. The patient voices understanding of current disease status and treatment options and is in agreement with the current care plan.  All questions were answered. The  patient knows to call the clinic with any problems, questions or concerns. We can certainly see the patient much sooner if necessary.  I spent 20 minutes counseling the patient face to face. The total time spent in the appointment was 30 minutes.  Disclaimer: This note was dictated with voice recognition software. Similar sounding words can inadvertently be transcribed and may not be corrected upon review.

## 2014-01-10 NOTE — Telephone Encounter (Addendum)
Discussed with dr Harrington Challenger, talked with pt, aware dr Harrington Challenger had discussed pt symptoms with dr Chase Caller. He taught most of the problem was related to the pts weight and her previous lung procedure.  Dr Harrington Challenger recommends a trial of weight loss and will refer pt to dietician if she would like. Pt reports she has seen a dietician before and is not interested in that referral. She wants me to let dr Harrington Challenger know she does not think this is the answer. She does not feel it is related to her weight. Will make dr Harrington Challenger aware.

## 2014-01-25 ENCOUNTER — Other Ambulatory Visit: Payer: Medicare Other

## 2014-01-25 ENCOUNTER — Ambulatory Visit: Payer: Medicare Other | Admitting: Oncology

## 2014-02-01 ENCOUNTER — Telehealth: Payer: Self-pay | Admitting: Internal Medicine

## 2014-02-01 NOTE — Telephone Encounter (Signed)
lmtcb

## 2014-02-01 NOTE — Telephone Encounter (Signed)
Would set up for cardiopulmonary stress test.  Check on coverage. Patient will need f/u in 1 year from last visit for AI.

## 2014-02-01 NOTE — Telephone Encounter (Signed)
Left message for pt to call.

## 2014-02-01 NOTE — Telephone Encounter (Signed)
Patient is returning your call. Please call back.  °

## 2014-02-01 NOTE — Telephone Encounter (Signed)
Spoke with pt, Aware of dr Harrington Challenger recommendation  Orders placed and sent to [facility] for scheduling.

## 2014-02-08 ENCOUNTER — Ambulatory Visit (HOSPITAL_COMMUNITY): Payer: Medicare Other | Attending: Internal Medicine

## 2014-02-08 DIAGNOSIS — R0602 Shortness of breath: Secondary | ICD-10-CM

## 2014-02-08 DIAGNOSIS — J8410 Pulmonary fibrosis, unspecified: Secondary | ICD-10-CM

## 2014-02-23 ENCOUNTER — Other Ambulatory Visit (INDEPENDENT_AMBULATORY_CARE_PROVIDER_SITE_OTHER): Payer: Medicare Other | Admitting: Family Medicine

## 2014-02-23 ENCOUNTER — Telehealth: Payer: Self-pay

## 2014-02-23 ENCOUNTER — Other Ambulatory Visit: Payer: Self-pay | Admitting: Family Medicine

## 2014-02-23 ENCOUNTER — Encounter: Payer: Self-pay | Admitting: Family Medicine

## 2014-02-23 VITALS — BP 132/72 | HR 74 | Temp 98.2°F | Resp 16 | Ht 67.0 in | Wt 236.6 lb

## 2014-02-23 DIAGNOSIS — R300 Dysuria: Secondary | ICD-10-CM

## 2014-02-23 DIAGNOSIS — Z862 Personal history of diseases of the blood and blood-forming organs and certain disorders involving the immune mechanism: Secondary | ICD-10-CM

## 2014-02-23 DIAGNOSIS — C3490 Malignant neoplasm of unspecified part of unspecified bronchus or lung: Secondary | ICD-10-CM

## 2014-02-23 LAB — POCT URINALYSIS DIPSTICK
Bilirubin, UA: NEGATIVE
Blood, UA: NEGATIVE
Glucose, UA: NEGATIVE
Ketones, UA: NEGATIVE
Leukocytes, UA: NEGATIVE
Nitrite, UA: NEGATIVE
Protein, UA: NEGATIVE
Spec Grav, UA: >=1.03
Urobilinogen, UA: 0.2
pH, UA: 5.5

## 2014-02-23 LAB — CBC WITH DIFFERENTIAL/PLATELET
Basophils Absolute: 0 10*3/uL (ref 0.0–0.1)
Basophils Relative: 0 % (ref 0–1)
Eosinophils Absolute: 0.3 10*3/uL (ref 0.0–0.7)
Eosinophils Relative: 4 % (ref 0–5)
HCT: 37.3 % (ref 36.0–46.0)
Hemoglobin: 12.8 g/dL (ref 12.0–15.0)
Lymphocytes Relative: 26 % (ref 12–46)
Lymphs Abs: 2 10*3/uL (ref 0.7–4.0)
MCH: 31.3 pg (ref 26.0–34.0)
MCHC: 34.3 g/dL (ref 30.0–36.0)
MCV: 91.2 fL (ref 78.0–100.0)
Monocytes Absolute: 0.6 10*3/uL (ref 0.1–1.0)
Monocytes Relative: 8 % (ref 3–12)
Neutro Abs: 4.7 10*3/uL (ref 1.7–7.7)
Neutrophils Relative %: 62 % (ref 43–77)
Platelets: 296 10*3/uL (ref 150–400)
RBC: 4.09 MIL/uL (ref 3.87–5.11)
RDW: 13.2 % (ref 11.5–15.5)
WBC: 7.6 10*3/uL (ref 4.0–10.5)

## 2014-02-23 LAB — POCT UA - MICROSCOPIC ONLY
Bacteria, U Microscopic: NEGATIVE
Casts, Ur, LPF, POC: NEGATIVE
Crystals, Ur, HPF, POC: NEGATIVE
Yeast, UA: NEGATIVE

## 2014-02-23 MED ORDER — CEPHALEXIN 500 MG PO CAPS: 500.0000 mg | ORAL_CAPSULE | Freq: Two times a day (BID) | ORAL | Status: DC

## 2014-02-23 NOTE — Progress Notes (Addendum)
[facility] [address] [place] [address] [phone]  Date:  02/23/2014   Name:  [name]   DOB:  01-Jul-1956   MRN:  [id number]  PCP:  Jenny Reichmann, MD    Chief Complaint: Abnormal Lab and Dysuria   History of Present Illness:  [name] is a 58 y.o. very pleasant female patient who presents with the following:  Here today to have some repeat labs (needs a CBC and CMP as per Dr. Julien Nordmann), and to discus persistent urinary discomfort.   She was here on 3/16 with burning with urination.  She was treated with diflucan, urine culture showed low growth of mixed bacteria. She now notes that her vaginal itching is resolved.  However she now notes some burning just when she does have to pee.  She does not note any urgency or frequency.  She did note some odor of her urine.   No abdominal pain, no fever, nausea or vomiting.   She did see her partner in March of this year.  They are together sporadically.  She had a negative genprobe in January of this year.  Her partner may possibly have other partners.    I performed a pelvic exam for her in March which was normal    Patient Active Problem List   Diagnosis Date Noted  . 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 Pulmonary fibrosis 01/20/2012  . Pneumothorax, spontaneous, tension 01/20/2012  . Insomnia   . Obesity   . Anemia   . COPD (chronic obstructive pulmonary disease)   . Kidney stones   . AORTIC INSUFFICIENCY 09/05/2010  . UNDIAGNOSED CARDIAC MURMURS 02/24/2009  . 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 DYSPNEA 09/20/2008  . DIABETES MELLITUS, TYPE II 06/19/2007    Past Medical History  Diagnosis Date  . lung ca dx'd 10/1999    chemo/xrt comp 05/29/2000  . Insomnia   . Nephrolithiasis   . Obesity   . Erythema nodosum   . Aortic insufficiency   . Diabetes mellitus   . Allergy   . Anemia   . COPD (chronic obstructive pulmonary disease)   . Heart murmur   . Myalgia   . Kidney  stones   . Aortic regurgitation   . 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 Pulmonary fibrosis 01/20/2012    Due to previous surgery and chest radiation for lung cancer  . Pneumothorax, spontaneous, tension 01/20/2012    October, 2010    Past Surgical History  Procedure Laterality Date  . Chest surgery      bilateral lung  . Shoulder surgery      right  . Inner ear surgery      right   . Abdominal hysterectomy    . Colonoscopy    . Upper gastrointestinal endoscopy    . Total knee arthroplasty      RIGHT  . Carpal tunnel release      RIGHT in 1998/Left hand in 2009  . Pulmonary embolism surgery  2007    LUNG COLLAPSE  . Esophageal dilation    . Knee operation      LEFT LATERAL RELEASE  . Foot surgery      Callous removed /Bil  . Appendectomy      History  Substance Use Topics  . Smoking status: Former Smoker  Quit date: 07/22/1999  . Smokeless tobacco: Never Used  . Alcohol Use: No    Family History  Problem Relation Age of Onset  . Heart disease Father   . Diabetes Father   . Pancreatic cancer Mother   . Diabetes Mother   . Hypertension Sister     x 3  . Diabetes Sister     x 4    Allergies  Allergen Reactions  . Hydrocodone Hives and Itching  . Morphine And Related Other (See Comments)    GI PROBLEMS  . Nsaids Other (See Comments)    GI PROBLEMS    Medication list has been reviewed and updated.  Current Outpatient Prescriptions on File Prior to Visit  Medication Sig Dispense Refill  . fluconazole (DIFLUCAN) 150 MG tablet Take 1 tablet (150 mg total) by mouth daily. Repeat weekly as needed  4 tablet  0  . insulin aspart protamine- aspart (NOVOLOG MIX 70/30) (70-30) 100 UNIT/ML injection Inject into the skin.      . Multiple Vitamins-Minerals (MULTIVITAMIN GUMMIES ADULT) CHEW Chew 2 tablets by mouth daily.      . sitaGLIPtin (JANUVIA)  100 MG tablet Take 100 mg by mouth daily.         No current facility-administered medications on file prior to visit.    Review of Systems:  As per HPI- otherwise negative.   Physical Examination: Filed Vitals:   02/23/14 1520  BP: 132/72  Pulse: 74  Temp: 98.2 F (36.8 C)  Resp: 16   Filed Vitals:   02/23/14 1520  Height: '5\' 7"'  (1.702 m)  Weight: 236 lb 9.6 oz (107.321 kg)   Body mass index is 37.05 kg/(m^2). Ideal Body Weight: Weight in (lb) to have BMI = 25: 159.3  GEN: WDWN, NAD, Non-toxic, A & O x 3, obese, looks well HEENT: Atraumatic, Normocephalic. Neck supple. No masses, No LAD. Ears and Nose: No external deformity. CV: RRR, No M/G/R. No JVD. No thrill. No extra heart sounds. PULM: CTA B, no wheezes, crackles, rhonchi. No retractions. No resp. distress. No accessory muscle use. ABD: S, NT, ND, +BS. No rebound. No HSM. EXTR: No c/c/e NEURO Normal gait.  PSYCH: Normally interactive. Conversant. Not depressed or anxious appearing.  Calm demeanor.   Results for orders placed in visit on 02/23/14  CBC WITH DIFFERENTIAL      Result Value Ref Range   WBC 7.6  4.0 - 10.5 K/uL   RBC 4.09  3.87 - 5.11 MIL/uL   Hemoglobin 12.8  12.0 - 15.0 g/dL   HCT 37.3  36.0 - 46.0 %   MCV 91.2  78.0 - 100.0 fL   MCH 31.3  26.0 - 34.0 pg   MCHC 34.3  30.0 - 36.0 g/dL   RDW 13.2  11.5 - 15.5 %   Platelets 296  150 - 400 K/uL   Neutrophils Relative % 62  43 - 77 %   Neutro Abs 4.7  1.7 - 7.7 K/uL   Lymphocytes Relative 26  12 - 46 %   Lymphs Abs 2.0  0.7 - 4.0 K/uL   Monocytes Relative 8  3 - 12 %   Monocytes Absolute 0.6  0.1 - 1.0 K/uL   Eosinophils Relative 4  0 - 5 %   Eosinophils Absolute 0.3  0.0 - 0.7 K/uL   Basophils Relative 0  0 - 1 %   Basophils Absolute 0.0  0.0 - 0.1 K/uL   Smear Review Criteria for review not met  COMPREHENSIVE METABOLIC PANEL      Result Value Ref Range   Sodium    135 - 145 mEq/L   Potassium    3.5 - 5.3 mEq/L   Chloride    96 - 112  mEq/L   CO2    19 - 32 mEq/L   Glucose, Bld    70 - 99 mg/dL   BUN    6 - 23 mg/dL   Creat    0.50 - 1.35 mg/dL   Total Bilirubin    0.2 - 1.2 mg/dL   Alkaline Phosphatase    39 - 117 U/L   AST    0 - 37 U/L   ALT    0 - 53 U/L   Total Protein    6.0 - 8.3 g/dL   Albumin    3.5 - 5.2 g/dL   Calcium    8.4 - 10.5 mg/dL  POCT UA - MICROSCOPIC ONLY      Result Value Ref Range   WBC, Ur, HPF, POC 0-2     RBC, urine, microscopic 0-2     Bacteria, U Microscopic neg     Mucus, UA trace     Epithelial cells, urine per micros 0-4 with clumps     Crystals, Ur, HPF, POC neg     Casts, Ur, LPF, POC neg     Yeast, UA neg    POCT URINALYSIS DIPSTICK      Result Value Ref Range   Color, UA yellow     Clarity, UA clear     Glucose, UA neg     Bilirubin, UA neg     Ketones, UA neg     Spec Grav, UA >=1.030     Blood, UA neg     pH, UA 5.5     Protein, UA neg     Urobilinogen, UA 0.2     Nitrite, UA neg     Leukocytes, UA Negative        Assessment and Plan: Burning with urination - Plan: POCT UA - Microscopic Only, POCT urinalysis dipstick, CBC with Differential, Comprehensive metabolic panel, Urine culture  NEOP, MALIG, [place]/LUNG NEC - Plan: CBC with Differential, Comprehensive metabolic panel (Cmet) - [facility], CBC with Differential, Comprehensive metabolic panel  Possible UTI.  Will treat with keflex while we await her urine culture, and she will repeat her diflucan.  Await genprobe, will send her other labs along to Dr. Jerilynn Mages when they come in.    Signed Lamar Blinks, MD  5/8: called and discussed with her.  Her urine looked ok and her genprobe was negative. She is feeling better and will let me know if she needs anything else.

## 2014-02-23 NOTE — Patient Instructions (Signed)
Take the second diflucan pill. Also use the keflex rx twice a day for one week. I will be in touch with the rest of your labs when they come in.  Take care!

## 2014-02-24 ENCOUNTER — Telehealth: Payer: Self-pay | Admitting: Internal Medicine

## 2014-02-24 ENCOUNTER — Other Ambulatory Visit: Payer: Self-pay | Admitting: Internal Medicine

## 2014-02-24 LAB — COMPREHENSIVE METABOLIC PANEL
ALT: 18 U/L (ref 0–35)
AST: 16 U/L (ref 0–37)
Albumin: 3.9 g/dL (ref 3.5–5.2)
Alkaline Phosphatase: 96 U/L (ref 39–117)
BUN: 19 mg/dL (ref 6–23)
CO2: 25 mEq/L (ref 19–32)
Calcium: 9.6 mg/dL (ref 8.4–10.5)
Chloride: 98 mEq/L (ref 96–112)
Creat: 0.72 mg/dL (ref 0.50–1.10)
Glucose, Bld: 181 mg/dL — ABNORMAL HIGH (ref 70–99)
Potassium: 4.8 mEq/L (ref 3.5–5.3)
Sodium: 136 mEq/L (ref 135–145)
Total Bilirubin: 0.5 mg/dL (ref 0.2–1.2)
Total Protein: 7.1 g/dL (ref 6.0–8.3)

## 2014-02-24 LAB — GC/CHLAMYDIA PROBE AMP
CT Probe RNA: NEGATIVE
GC Probe RNA: NEGATIVE

## 2014-02-24 LAB — URINE CULTURE
Colony Count: NO GROWTH
Organism ID, Bacteria: NO GROWTH

## 2014-02-24 NOTE — Telephone Encounter (Signed)
Patient would like results of test. Please call and advise.

## 2014-02-24 NOTE — Telephone Encounter (Signed)
Left pt a message to call back. 

## 2014-02-24 NOTE — Telephone Encounter (Signed)
Called patient back. She would like results of the CPX done on 4/21 at [facility] will send to Dr.Ross for review and call her back.

## 2014-02-25 ENCOUNTER — Encounter: Payer: Self-pay | Admitting: Family Medicine

## 2014-02-28 NOTE — Telephone Encounter (Signed)
Does not qualify for pulm rehab

## 2014-02-28 NOTE — Telephone Encounter (Signed)
To Dr. Alan Ripper desktop for review.

## 2014-02-28 NOTE — Telephone Encounter (Signed)
Patient;s cardiopulmonary stress test suggest limitation is due to deconditoining and obesity Would recomm that she increase her activity   Will forward to pulm as well to see if she is candidate for pulmonary rehab.

## 2014-03-02 NOTE — Telephone Encounter (Signed)
Spoke with pt, aware of results of cpx testing and she does not qualify for rehab. She will contact if she cont to have symptoms Weight loss was recommended

## 2014-03-09 ENCOUNTER — Telehealth: Payer: Self-pay

## 2014-03-09 NOTE — Telephone Encounter (Signed)
Please call this patient. Her recent urine culture revealed NO GROWTH. If she continues to have urinary symptoms, she needs to RTC for re-evaluation.

## 2014-03-09 NOTE — Telephone Encounter (Signed)
PT STATES DR COPLAND WANTED HER TO TRY THE KEFLEX 500MG S AND LET HER KNOW IF IT WORKS, SHE WILL THEN CALL HER SOME IN. PLEASE CALL PT AT [phone]     [facility] ON [address]

## 2014-03-09 NOTE — Telephone Encounter (Signed)
Spoke with pt and she is still having burning with urination. She finished her course of the Keflex 500mg  and would like some more.

## 2014-03-10 NOTE — Telephone Encounter (Signed)
Pt is going to rtc tomorrow to see Dr. Lorelei Pont.

## 2014-03-16 ENCOUNTER — Ambulatory Visit (INDEPENDENT_AMBULATORY_CARE_PROVIDER_SITE_OTHER): Payer: Medicare Other | Admitting: Family Medicine

## 2014-03-16 VITALS — BP 118/74 | HR 78 | Temp 97.7°F | Resp 18 | Wt 240.0 lb

## 2014-03-16 DIAGNOSIS — B3731 Acute candidiasis of vulva and vagina: Secondary | ICD-10-CM

## 2014-03-16 DIAGNOSIS — B373 Candidiasis of vulva and vagina: Secondary | ICD-10-CM

## 2014-03-16 DIAGNOSIS — R300 Dysuria: Secondary | ICD-10-CM

## 2014-03-16 LAB — POCT WET PREP WITH KOH
Clue Cells Wet Prep HPF POC: NEGATIVE
KOH Prep POC: POSITIVE
RBC Wet Prep HPF POC: NEGATIVE
Trichomonas, UA: NEGATIVE
Yeast Wet Prep HPF POC: NEGATIVE

## 2014-03-16 LAB — POCT UA - MICROSCOPIC ONLY
Bacteria, U Microscopic: NEGATIVE
Casts, Ur, LPF, POC: NEGATIVE
Crystals, Ur, HPF, POC: NEGATIVE
Mucus, UA: NEGATIVE
Yeast, UA: NEGATIVE

## 2014-03-16 LAB — POCT URINALYSIS DIPSTICK
Bilirubin, UA: NEGATIVE
Glucose, UA: NEGATIVE
Ketones, UA: NEGATIVE
Leukocytes, UA: NEGATIVE
Nitrite, UA: NEGATIVE
Protein, UA: NEGATIVE
Spec Grav, UA: >=1.03
Urobilinogen, UA: 0.2
pH, UA: 5.5

## 2014-03-16 MED ORDER — FLUCONAZOLE 150 MG PO TABS: 150.0000 mg | ORAL_TABLET | Freq: Every day | ORAL | Status: DC

## 2014-03-16 NOTE — Patient Instructions (Addendum)
I suspect your symptoms may actually be due to vaginal dryness.  You also have a yeast infection today. We are going to try treating you with a diflucan pill- take one and repeat in a week if needed.  However if your symptoms persist we can try a vaginal estrogen cream.  Let me know if you would like to do this; give me a call

## 2014-03-16 NOTE — Progress Notes (Addendum)
[facility] [address] [place] [address] [phone]  Date:  03/16/2014   Name:  [name]   DOB:  1956/06/30   MRN:  [id number]  PCP:  Jenny Reichmann, MD    Chief Complaint: burning with urination   History of Present Illness:  [name] is a 58 y.o. very pleasant female patient who presents with the following:  She is here today to follow-up persistent discomfort with urination. She feels that she has burning with urination, and urgency.  She think her discomfrot may be due the urine touching her vulva.  Recent negative genprobe. She also had a benign wet prep in March.    She does not note any vaginal discharge.  She had noted some itching but this is now gone.  She has noted some mild discomfort with intercourse recently- consider dryness as the cause of her persistent vaginal sx.   She does not note any abdominal pain or back pain.    Patient Active Problem List   Diagnosis Date Noted  . 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 Pulmonary fibrosis 01/20/2012  . Pneumothorax, spontaneous, tension 01/20/2012  . Insomnia   . Obesity   . Anemia   . COPD (chronic obstructive pulmonary disease)   . Kidney stones   . AORTIC INSUFFICIENCY 09/05/2010  . UNDIAGNOSED CARDIAC MURMURS 02/24/2009  . 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 DYSPNEA 09/20/2008  . DIABETES MELLITUS, TYPE II 06/19/2007    Past Medical History  Diagnosis Date  . lung ca dx'd 10/1999    chemo/xrt comp 05/29/2000  . Insomnia   . Nephrolithiasis   . Obesity   . Erythema nodosum   . Aortic insufficiency   . Diabetes mellitus   . Allergy   . Anemia   . COPD (chronic obstructive pulmonary disease)   . Heart murmur   . Myalgia   . Kidney stones   . Aortic regurgitation   . 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 Pulmonary fibrosis 01/20/2012    Due to  previous surgery and chest radiation for lung cancer  . Pneumothorax, spontaneous, tension 01/20/2012    October, 2010    Past Surgical History  Procedure Laterality Date  . Chest surgery      bilateral lung  . Shoulder surgery      right  . Inner ear surgery      right   . Abdominal hysterectomy    . Colonoscopy    . Upper gastrointestinal endoscopy    . Total knee arthroplasty      RIGHT  . Carpal tunnel release      RIGHT in 1998/Left hand in 2009  . Pulmonary embolism surgery  2007    LUNG COLLAPSE  . Esophageal dilation    . Knee operation      LEFT LATERAL RELEASE  . Foot surgery      Callous removed /Bil  . Appendectomy      History  Substance Use Topics  . Smoking status: Former Smoker    Quit date: 07/22/1999  . Smokeless tobacco: Never Used  . Alcohol Use: No    Family History  Problem Relation Age of Onset  . Heart disease Father   . Diabetes Father   . Pancreatic cancer Mother   . Diabetes Mother   . Hypertension Sister  x 3  . Diabetes Sister     x 4    Allergies  Allergen Reactions  . Hydrocodone Hives and Itching  . Morphine And Related Other (See Comments)    GI PROBLEMS  . Nsaids Other (See Comments)    GI PROBLEMS    Medication list has been reviewed and updated.  Current Outpatient Prescriptions on File Prior to Visit  Medication Sig Dispense Refill  . insulin aspart protamine- aspart (NOVOLOG MIX 70/30) (70-30) 100 UNIT/ML injection Inject into the skin.      . Multiple Vitamins-Minerals (MULTIVITAMIN GUMMIES ADULT) CHEW Chew 2 tablets by mouth daily.      . sitaGLIPtin (JANUVIA) 100 MG tablet Take 100 mg by mouth daily.        . cephALEXin (KEFLEX) 500 MG capsule Take 1 capsule (500 mg total) by mouth 2 (two) times daily.  14 capsule  0  . fluconazole (DIFLUCAN) 150 MG tablet Take 1 tablet (150 mg total) by mouth daily. Repeat weekly as needed  4 tablet  0   No current facility-administered medications on file prior to visit.     Review of Systems:  As per HPI- otherwise negative.   Physical Examination: Filed Vitals:   03/16/14 1515  BP: 118/74  Pulse: 78  Temp: 97.7 F (36.5 C)  Resp: 18   Filed Vitals:   03/16/14 1515  Weight: 240 lb (108.863 kg)   Body mass index is 37.58 kg/(m^2). Ideal Body Weight:    GEN: WDWN, NAD, Non-toxic, A & O x 3, looks well, obese.   HEENT: Atraumatic, Normocephalic. Neck supple. No masses, No LAD. Ears and Nose: No external deformity. CV: RRR, No M/G/R. No JVD. No thrill. No extra heart sounds. PULM: CTA B, no wheezes, crackles, rhonchi. No retractions. No resp. distress. No accessory muscle use. ABD: S, NT, ND, +BS. No rebound. No HSM. EXTR: No c/c/e NEURO Normal gait.  PSYCH: Normally interactive. Conversant. Not depressed or anxious appearing.  Calm demeanor.  Gu:  Normal exam for age. She has slight dryness of the mucosa but not severe.  S/p hysterectomy.  Some white discharge in vaginal vault   Results for orders placed in visit on 03/16/14  POCT URINALYSIS DIPSTICK      Result Value Ref Range   Color, UA yellow     Clarity, UA clear     Glucose, UA neg     Bilirubin, UA neg     Ketones, UA neg     Spec Grav, UA >=1.030     Blood, UA trace-intact     pH, UA 5.5     Protein, UA neg     Urobilinogen, UA 0.2     Nitrite, UA neg     Leukocytes, UA Negative    POCT UA - MICROSCOPIC ONLY      Result Value Ref Range   WBC, Ur, HPF, POC 0-1     RBC, urine, microscopic 0-1     Bacteria, U Microscopic neg     Mucus, UA neg     Epithelial cells, urine per micros 1-6     Crystals, Ur, HPF, POC neg     Casts, Ur, LPF, POC neg     Yeast, UA neg    POCT WET PREP WITH KOH      Result Value Ref Range   Trichomonas, UA Negative     Clue Cells Wet Prep HPF POC neg     Epithelial Wet Prep HPF POC 0-4  with clumps     Yeast Wet Prep HPF POC neg     Bacteria Wet Prep HPF POC 2+     RBC Wet Prep HPF POC neg     WBC Wet Prep HPF POC 0-3     KOH Prep POC  Positive      Assessment and Plan: Burning with urination - Plan: POCT urinalysis dipstick, POCT UA - Microscopic Only, POCT Wet Prep with KOH, Urine culture  Dysuria - Plan: fluconazole (DIFLUCAN) 150 MG tablet  Yeast vaginitis  At this time she does appear to have a yeast infection.  Will try treatment with diflucan and recheck urine culture.  However if sx persist discussed trying a topical estrogen.  She would like to do this if her sx do not remit and will be in touch.   Signed Lamar Blinks, MD  Received prelim urine culture 5/29- positive for GNR.  Will treat with keflex pending sensitivity.  She will repeat her diflucan in one week Received sen.  Will change to cipro 250 BID for 3 days.  Called and let her know, she understands

## 2014-03-18 MED ORDER — CEPHALEXIN 500 MG PO CAPS: 500.0000 mg | ORAL_CAPSULE | Freq: Two times a day (BID) | ORAL | Status: DC

## 2014-03-18 NOTE — Addendum Note (Signed)
Addended by: Lamar Blinks C on: 03/18/2014 10:42 AM   Modules accepted: Orders

## 2014-03-19 LAB — URINE CULTURE: Colony Count: >=100000

## 2014-03-19 MED ORDER — CIPROFLOXACIN HCL 250 MG PO TABS: 250.0000 mg | ORAL_TABLET | Freq: Two times a day (BID) | ORAL | Status: DC

## 2014-03-19 NOTE — Addendum Note (Signed)
Addended by: Lamar Blinks C on: 03/19/2014 11:54 AM   Modules accepted: Orders

## 2014-06-28 ENCOUNTER — Telehealth: Payer: Self-pay | Admitting: *Deleted

## 2014-06-28 NOTE — Telephone Encounter (Signed)
Phoned & [facility] for patient---when she calls back - I am needing her diabetic eye exam info and if she can come in for her Annual Medicare Wellness Exam, a lipid panel/LCL-C and a HbA1C.

## 2014-06-28 NOTE — Telephone Encounter (Signed)
Patient returned phone call, stating her eye doctor had performed her annual diabetic eye exam this year (Dr. Clent Jacks on [facility]) and that her endocrinologist had drawn her HbA1c within the past 3 months.  She was driving & talking on her cell and is going to call me back to give further information & understood I was attempting to prevent her from undergoing repetitive labs.

## 2014-06-30 ENCOUNTER — Telehealth: Payer: Self-pay | Admitting: *Deleted

## 2014-06-30 NOTE — Telephone Encounter (Signed)
Spoke with Callas at [place] she is going to fax patient's diabetic eye exam report.  Phoned patient's endocrinologist, Dr. Almetta Lovely office [facility]) to request labs that patient stated had been drawn within the last 3 months. [facility] for Lattie Haw in medical records with needed labs and the fax number she could fax them to as well as our office # and my direct #.

## 2014-07-20 ENCOUNTER — Other Ambulatory Visit: Payer: Self-pay

## 2014-07-20 DIAGNOSIS — Z1231 Encounter for screening mammogram for malignant neoplasm of breast: Secondary | ICD-10-CM

## 2014-08-17 ENCOUNTER — Ambulatory Visit
Admission: RE | Admit: 2014-08-17 | Discharge: 2014-08-17 | Disposition: A | Discharge status: Home / Self Care | Payer: Medicare Other | Source: Ambulatory Visit

## 2014-08-17 DIAGNOSIS — Z1231 Encounter for screening mammogram for malignant neoplasm of breast: Secondary | ICD-10-CM

## 2014-10-21 DIAGNOSIS — I2510 Atherosclerotic heart disease of native coronary artery without angina pectoris: Secondary | ICD-10-CM | POA: Insufficient documentation

## 2014-12-19 ENCOUNTER — Encounter: Payer: Self-pay | Admitting: *Deleted

## 2014-12-25 ENCOUNTER — Ambulatory Visit (INDEPENDENT_AMBULATORY_CARE_PROVIDER_SITE_OTHER): Payer: Medicare Other | Admitting: Emergency Medicine

## 2014-12-25 ENCOUNTER — Ambulatory Visit (INDEPENDENT_AMBULATORY_CARE_PROVIDER_SITE_OTHER): Payer: Medicare Other

## 2014-12-25 VITALS — BP 134/70 | HR 77 | Temp 97.5°F | Resp 16 | Ht 67.5 in | Wt 238.2 lb

## 2014-12-25 DIAGNOSIS — S99921A Unspecified injury of right foot, initial encounter: Secondary | ICD-10-CM

## 2014-12-25 DIAGNOSIS — S92911A Unspecified fracture of right toe(s), initial encounter for closed fracture: Secondary | ICD-10-CM | POA: Diagnosis not present

## 2014-12-25 DIAGNOSIS — M79674 Pain in right toe(s): Secondary | ICD-10-CM

## 2014-12-25 NOTE — Patient Instructions (Addendum)
-   Please use buddy tape system on your 4th and 5th right toe for 2 weeks. Wear the post-op shoe provided as needed to avoid the compression caused by your shoe. - If your symptoms do not resolve let me know and we will refer you to an orthopedist. - You may take Tylenol over the counter for your pain. Please let me know if you need something stronger and we can try Tramadol.

## 2014-12-25 NOTE — Progress Notes (Signed)
    MRN: [id number] DOB: 1956/03/07  Subjective:   [name] is a 59 y.o. female presenting for chief complaint of Toe Injury  Reports 2 day history of right 5th toe injury. Patient was walking down steps, stubbed her right pinky toe. Has felt pain, mild swelling since worse with walking. She tried Oxycontin x1 with some relief, had this medication left over from previous surgery. Denies bony deformity, bleeding, erythema, discoloration, decreased ROM, numbness, tingling. Patient is worried it is fractured, has suffered 2 fractures previously (2012, 2007) treated by [name] Breslow, MD, her orthopedist. Denies any other aggravating or relieving factors, no other questions or concerns.  [name] has a current medication list which includes the following prescription(s): insulin aspart protamine- aspart. She is allergic to hydrocodone; morphine and related; and nsaids.  [name]  has a past medical history of lung ca (dx'd 10/1999); Insomnia; Nephrolithiasis; Obesity; Erythema nodosum; Aortic insufficiency; Diabetes mellitus; Allergy; Anemia; COPD (chronic obstructive pulmonary disease); Heart murmur; Myalgia; Kidney stones; Aortic regurgitation; Carcinoma of hilus of lung (01/20/2012); Pulmonary fibrosis (01/20/2012); and Pneumothorax, spontaneous, tension (01/20/2012). Also  has past surgical history that includes Chest surgery; Shoulder surgery; Inner ear surgery; Abdominal hysterectomy; Colonoscopy; Upper gastrointestinal endoscopy; Total knee arthroplasty; Carpal tunnel release; Pulmonary embolism surgery (2007); Esophageal dilation; KNEE OPERATION; Foot surgery; and Appendectomy.  ROS As in subjective.  Objective:   Vitals: BP 134/70 mmHg  Pulse 77  Temp(Src) 97.5 F (36.4 C) (Oral)  Resp 16  Ht 5' 7.5" (1.715 m)  Wt 238 lb 4 oz (108.069 kg)  BMI 36.74 kg/m2  SpO2 100%  Physical Exam  Constitutional: She is oriented to person, place, and time and well-developed, well-nourished, and in no  distress.  Cardiovascular: Normal rate.   Pulmonary/Chest: Effort normal.  Musculoskeletal:       Feet:  Neurological: She is alert and oriented to person, place, and time.  Skin: Skin is warm and dry. No rash noted. No erythema. No pallor.   [facility] reading (PRIMARY) by  Dr. Ouida [name] and PA-Mani. Right foot: Non-displaced fracture of the base middle phalange 5th toe.  Assessment and Plan :   1. Toe injury, right, initial encounter 2. Pain of toe of right foot 3. Toe fracture, right, closed, initial encounter - Recommended buddy tape system for 2 weeks, post-op shoe, Tylenol for pain. - If no improvement in 2 weeks, return to clinic for re-evaluation, consider referral to ortho.  [name] Eagles, PA-C [facility] [phone] 12/25/2014 3:28 PM

## 2014-12-26 NOTE — Progress Notes (Signed)
  Medical screening examination/treatment/procedure(s) were performed by non-physician practitioner and as supervising physician I was immediately available for consultation/collaboration.     

## 2015-01-06 ENCOUNTER — Encounter: Payer: Self-pay | Admitting: Internal Medicine

## 2015-01-06 ENCOUNTER — Ambulatory Visit (INDEPENDENT_AMBULATORY_CARE_PROVIDER_SITE_OTHER): Payer: Medicare Other | Admitting: Internal Medicine

## 2015-01-06 ENCOUNTER — Other Ambulatory Visit: Payer: Self-pay | Admitting: Medical Oncology

## 2015-01-06 VITALS — BP 126/72 | HR 81 | Ht 67.5 in | Wt 243.0 lb

## 2015-01-06 DIAGNOSIS — C3400 Malignant neoplasm of unspecified main bronchus: Secondary | ICD-10-CM

## 2015-01-06 DIAGNOSIS — I351 Nonrheumatic aortic (valve) insufficiency: Secondary | ICD-10-CM

## 2015-01-06 NOTE — Progress Notes (Signed)
Cardiology Office Note   Date:  01/06/2015   ID:  [name], DOB [birth year], MRN [id number]  PCP:  Jenny Reichmann, MD  Cardiologist:   Dorris Carnes, MD   Chief Complaint  Patient presents with  . Chest Pain    palpations      History of Present Illness: [name] is a 59 y.o. female who has a history of mild to mod AI, DM, remote lung CA  Hx of CP in past  Felt musculoske  Neg myoview in 2013  Also history of palpitatiosn and remote syncope  No arrhytmia on monitor  Has had SOB Cardiopulm stress test done  Results suggest deconditioning and wt as the reason for symptoms  Patient is still having intermitt Chest painss  They are different in quality  SOme sharp  A spell on march 6 occurred while getting ready for church  Acomp by SOB  Lasted 5 min then went away   One burst of palpitations  Few seconds Still gets SOB with walking starirs           Current Outpatient Prescriptions  Medication Sig Dispense Refill  . insulin aspart protamine- aspart (NOVOLOG MIX 70/30) (70-30) 100 UNIT/ML injection 35 units in the morning and 25 units at night     No current facility-administered medications for this visit.    Allergies:   Hydrocodone; Morphine and related; and Nsaids   Past Medical History  Diagnosis Date  . lung ca dx'd 10/1999    chemo/xrt comp 05/29/2000  . Insomnia   . Nephrolithiasis   . Obesity   . Erythema nodosum   . Aortic insufficiency   . Diabetes mellitus   . Allergy   . Anemia   . COPD (chronic obstructive pulmonary disease)   . Heart murmur   . Myalgia   . Kidney stones   . Aortic regurgitation   . 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 Pulmonary fibrosis 01/20/2012    Due to previous surgery and chest radiation for lung cancer  . Pneumothorax, spontaneous, tension 01/20/2012    October, 2010    Past Surgical History  Procedure  Laterality Date  . Chest surgery      bilateral lung  . Shoulder surgery      right  . Inner ear surgery      right   . Abdominal hysterectomy    . Colonoscopy    . Upper gastrointestinal endoscopy    . Total knee arthroplasty      RIGHT  . Carpal tunnel release      RIGHT in 1998/Left hand in 2009  . Pulmonary embolism surgery  2007    LUNG COLLAPSE  . Esophageal dilation    . Knee operation      LEFT LATERAL RELEASE  . Foot surgery      Callous removed /Bil  . Appendectomy       Social History:  The patient  reports that she quit smoking about 15 years ago. She has never used smokeless tobacco. She reports that she does not drink alcohol or use illicit drugs.   Family History:  The patient's family history includes Diabetes in her brother, father, mother, and sister; Heart disease in her father; Hypertension in her sister; Pancreatic cancer in her mother.    ROS:  Please see the history of present illness. All  other systems are reviewed and  Negative to the above problem except as noted.    PHYSICAL EXAM: VS:  BP 126/72 mmHg  Pulse 81  Ht 5' 7.5" (1.715 m)  Wt 243 lb (110.224 kg)  BMI 37.48 kg/m2  GEN: Well nourished, well developed, in no acute distress HEENT: normal Neck: no JVD, carotid bruits, or masses Cardiac: RRR;  Gr II/VI systolic murmur LUSB  rubs, or gallops,no edema  Respiratory:  clear to auscultation bilaterally, normal work of breathing GI: soft, nontender, nondistended, + BS  No hepatomegaly  MS: no deformity Moving all extremities   Skin: warm and dry, no rash Neuro:  Strength and sensation are intact Psych: euthymic mood, full affect   EKG:  EKG is ordered today.  SR81 bpm     Lipid Panel    Component Value Date/Time   CHOL 135 03/13/2012 1208   TRIG 86.0 03/13/2012 1208   HDL 44.70 03/13/2012 1208   CHOLHDL 3 03/13/2012 1208   VLDL 17.2 03/13/2012 1208   LDLCALC 73 03/13/2012 1208      Wt Readings from Last 3 Encounters:    01/06/15 243 lb (110.224 kg)  12/25/14 238 lb 4 oz (108.069 kg)  03/16/14 240 lb (108.863 kg)      ASSESSMENT AND PLAN:  1  CP  Patient continues to have intermitt Cp  Work up so far is neg Some of symptoms are atypical  Will get echo  2  SOB  CPX in past sugg deconditioning and wt explain sympotms  Will get echo  3. AI  Echo       Current medicines are reviewed at length with the patient today.  The patient does not have concerns regarding medicines.  The following changes have been made: Non e  Labs/ tests ordered today include:  Echo   No orders of the defined types were placed in this encounter.     Disposition:   FU with me depends on test results    Signed, Dorris Carnes, MD  01/06/2015 2:09 PM    [facility] [address], [place], [place]  [address] Phone: [phone]; Fax: [phone]

## 2015-01-06 NOTE — Patient Instructions (Signed)

## 2015-01-09 ENCOUNTER — Other Ambulatory Visit: Payer: Medicare Other

## 2015-01-09 ENCOUNTER — Ambulatory Visit (HOSPITAL_COMMUNITY)
Admission: RE | Admit: 2015-01-09 | Discharge: 2015-01-09 | Disposition: A | Discharge status: Home / Self Care | Payer: Medicare Other | Source: Ambulatory Visit | Attending: Internal Medicine | Admitting: Internal Medicine

## 2015-01-09 ENCOUNTER — Encounter (HOSPITAL_COMMUNITY): Payer: Self-pay

## 2015-01-09 DIAGNOSIS — C3410 Malignant neoplasm of upper lobe, unspecified bronchus or lung: Secondary | ICD-10-CM | POA: Insufficient documentation

## 2015-01-09 DIAGNOSIS — M47894 Other spondylosis, thoracic region: Secondary | ICD-10-CM | POA: Diagnosis not present

## 2015-01-09 DIAGNOSIS — C3400 Malignant neoplasm of unspecified main bronchus: Secondary | ICD-10-CM

## 2015-01-09 DIAGNOSIS — Z85118 Personal history of other malignant neoplasm of bronchus and lung: Secondary | ICD-10-CM

## 2015-01-09 DIAGNOSIS — I313 Pericardial effusion (noninflammatory): Secondary | ICD-10-CM | POA: Insufficient documentation

## 2015-01-09 DIAGNOSIS — Z923 Personal history of irradiation: Secondary | ICD-10-CM | POA: Diagnosis not present

## 2015-01-09 LAB — CBC WITH DIFFERENTIAL/PLATELET
BASO%: 0.2 % (ref 0.0–2.0)
Basophils Absolute: 0 10*3/uL (ref 0.0–0.1)
EOS%: 6.3 % (ref 0.0–7.0)
Eosinophils Absolute: 0.3 10*3/uL (ref 0.0–0.5)
HCT: 36.9 % (ref 34.8–46.6)
HGB: 12.1 g/dL (ref 11.6–15.9)
LYMPH%: 33.8 % (ref 14.0–49.7)
MCH: 30.6 pg (ref 25.1–34.0)
MCHC: 32.8 g/dL (ref 31.5–36.0)
MCV: 93.4 fL (ref 79.5–101.0)
MONO#: 0.4 10*3/uL (ref 0.1–0.9)
MONO%: 7.3 % (ref 0.0–14.0)
NEUT#: 2.7 10*3/uL (ref 1.5–6.5)
NEUT%: 52.4 % (ref 38.4–76.8)
Platelets: 267 10*3/uL (ref 145–400)
RBC: 3.95 10*6/uL (ref 3.70–5.45)
RDW: 12.2 % (ref 11.2–14.5)
WBC: 5.2 10*3/uL (ref 3.9–10.3)
lymph#: 1.8 10*3/uL (ref 0.9–3.3)

## 2015-01-09 LAB — COMPREHENSIVE METABOLIC PANEL (CC13)
ALT: 16 U/L (ref 0–55)
AST: 16 U/L (ref 5–34)
Albumin: 3.5 g/dL (ref 3.5–5.0)
Alkaline Phosphatase: 110 U/L (ref 40–150)
Anion Gap: 10 mEq/L (ref 3–11)
BUN: 13.7 mg/dL (ref 7.0–26.0)
CO2: 28 mEq/L (ref 22–29)
Calcium: 9.2 mg/dL (ref 8.4–10.4)
Chloride: 106 mEq/L (ref 98–109)
Creatinine: 0.8 mg/dL (ref 0.6–1.1)
EGFR: >90 mL/min/{1.73_m2} (ref >90)
Glucose: 71 mg/dl (ref 70–140)
Potassium: 4.2 mEq/L (ref 3.5–5.1)
Sodium: 144 mEq/L (ref 136–145)
Total Bilirubin: 0.32 mg/dL (ref 0.20–1.20)
Total Protein: 7.1 g/dL (ref 6.4–8.3)

## 2015-01-09 MED ORDER — IOHEXOL 300 MG/ML  SOLN
80.0000 mL | Freq: Once | INTRAMUSCULAR | Status: AC | PRN
  Administered 2015-01-09: 80 mL via INTRAVENOUS

## 2015-01-11 ENCOUNTER — Ambulatory Visit (HOSPITAL_COMMUNITY): Payer: Medicare Other | Attending: Cardiology | Admitting: Radiology

## 2015-01-11 DIAGNOSIS — I351 Nonrheumatic aortic (valve) insufficiency: Secondary | ICD-10-CM | POA: Insufficient documentation

## 2015-01-11 NOTE — Progress Notes (Signed)
Echocardiogram performed.  

## 2015-01-13 ENCOUNTER — Telehealth: Payer: Self-pay | Admitting: Internal Medicine

## 2015-01-13 NOTE — Telephone Encounter (Signed)
Called patient to clarify which test results she wanted to receive in writing today. She states the ECHO results from Wednesday, January 11, 2015. Results printed out and left at front desk for pick up.

## 2015-01-13 NOTE — Telephone Encounter (Signed)
Pt was given results, giving the information to sister who said it didn't make sense so she thinks she misunderstood the info, would like nurse to write down results and call her and she will pick it up

## 2015-01-16 ENCOUNTER — Ambulatory Visit (HOSPITAL_BASED_OUTPATIENT_CLINIC_OR_DEPARTMENT_OTHER): Payer: Medicare Other | Admitting: Internal Medicine

## 2015-01-16 ENCOUNTER — Encounter: Payer: Self-pay | Admitting: Internal Medicine

## 2015-01-16 VITALS — BP 154/66 | HR 78 | Temp 98.2°F | Resp 19 | Ht 67.0 in | Wt 243.5 lb

## 2015-01-16 DIAGNOSIS — Z85118 Personal history of other malignant neoplasm of bronchus and lung: Secondary | ICD-10-CM | POA: Diagnosis not present

## 2015-01-16 DIAGNOSIS — C3400 Malignant neoplasm of unspecified main bronchus: Secondary | ICD-10-CM

## 2015-01-16 NOTE — Progress Notes (Signed)
[facility] [phone]   [phone]  OFFICE PROGRESS NOTE  [name] Reichmann, MD [place] [address]  DIAGNOSIS: Stage IIIB (T4, N2, M0) non-small cell lung cancer with squamoid differentiation diagnosed in December of 2000.  PRIOR THERAPY:  1) status post left upper lobectomy with lymph node dissection under the care of Dr. Arlyce Dice on 10/25/1999. 2) status post a course of concurrent chemoradiation under the care of Dr. Beryle Beams completed in 2001  CURRENT THERAPY: Observation.  INTERVAL HISTORY: [name] 59 y.o. female returns to the clinic today for followup visit. The patient is feeling fine today with no specific complaints except for mild fatigue. She has been observation for the last 15 years. She denied having any significant chest pain, shortness of breath, cough or hemoptysis. The patient denied having any weight loss or night sweats. She has no nausea or vomiting. She had repeat CT scan of the chest performed recently and she is here for evaluation and discussion of her scan results.  MEDICAL HISTORY: Past Medical History  Diagnosis Date  . Insomnia   . Nephrolithiasis   . Obesity   . Erythema nodosum   . Aortic insufficiency   . Allergy   . Anemia   . COPD (chronic obstructive pulmonary disease)   . Heart murmur   . Myalgia   . Kidney stones   . Aortic regurgitation   . Pulmonary fibrosis 01/20/2012    Due to previous surgery and chest radiation for lung cancer  . Pneumothorax, spontaneous, tension 01/20/2012    October, 2010  . lung ca dx'd 10/1999    chemo/xrt comp 05/29/2000  . 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 Diabetes mellitus     ALLERGIES:  is allergic to hydrocodone; morphine and related; and nsaids.  MEDICATIONS:  Current Outpatient Prescriptions  Medication Sig Dispense  Refill  . B-D UF III MINI PEN NEEDLES 31G X 5 MM MISC 2 (two) times daily. as directed  5  . insulin aspart protamine- aspart (NOVOLOG MIX 70/30) (70-30) 100 UNIT/ML injection 35 units in the morning and 25 units at night     No current facility-administered medications for this visit.    SURGICAL HISTORY:  Past Surgical History  Procedure Laterality Date  . Chest surgery      bilateral lung  . Shoulder surgery      right  . Inner ear surgery      right   . Abdominal hysterectomy    . Colonoscopy    . Upper gastrointestinal endoscopy    . Total knee arthroplasty      RIGHT  . Carpal tunnel release      RIGHT in 1998/Left hand in 2009  . Pulmonary embolism surgery  2007    LUNG COLLAPSE  . Esophageal dilation    . Knee operation      LEFT LATERAL RELEASE  . Foot surgery      Callous removed /Bil  . Appendectomy      REVIEW OF SYSTEMS:  Constitutional: positive for fatigue Eyes: negative Ears, nose, mouth, throat, and face: negative Respiratory: negative Cardiovascular: negative Gastrointestinal: negative Genitourinary:negative Integument/breast: negative Hematologic/lymphatic: negative Musculoskeletal:negative Neurological: negative Behavioral/Psych: negative Endocrine: negative Allergic/Immunologic: negative   PHYSICAL EXAMINATION: General appearance: alert, cooperative, fatigued and no distress Head: Normocephalic, without obvious abnormality, atraumatic Neck: no adenopathy,  no JVD, supple, symmetrical, trachea midline and thyroid not enlarged, symmetric, no tenderness/mass/nodules Lymph nodes: Cervical, supraclavicular, and axillary nodes normal. Resp: clear to auscultation bilaterally Back: symmetric, no curvature. ROM normal. No CVA tenderness. Cardio: diastolic murmur: holodiastolic 2/6, decrescendo at apex GI: soft, non-tender; bowel sounds normal; no masses,  no organomegaly Extremities: extremities normal, atraumatic, no cyanosis or edema Neurologic:  Alert and oriented X 3, normal strength and tone. Normal symmetric reflexes. Normal coordination and gait  ECOG PERFORMANCE STATUS: 1 - Symptomatic but completely ambulatory  Blood pressure 154/66, pulse 78, temperature 98.2 F (36.8 C), temperature source Oral, resp. rate 19, height 5\' 7"  (1.702 m), weight 243 lb 8 oz (110.451 kg), SpO2 97 %.  LABORATORY DATA: Lab Results  Component Value Date   WBC 5.2 01/09/2015   HGB 12.1 01/09/2015   HCT 36.9 01/09/2015   MCV 93.4 01/09/2015   PLT 267 01/09/2015      Chemistry      Component Value Date/Time   NA 144 01/09/2015 1109   NA 136 02/23/2014 1555   NA 141 01/14/2012 0900   K 4.2 01/09/2015 1109   K 4.8 02/23/2014 1555   K 4.4 01/14/2012 0900   CL 98 02/23/2014 1555   CL 105 01/19/2013 1126   CL 101 01/14/2012 0900   CO2 28 01/09/2015 1109   CO2 25 02/23/2014 1555   CO2 29 01/14/2012 0900   BUN 13.7 01/09/2015 1109   BUN 19 02/23/2014 1555   BUN 17 01/14/2012 0900   CREATININE 0.8 01/09/2015 1109   CREATININE 0.72 02/23/2014 1555   CREATININE 0.75 11/04/2012 1550      Component Value Date/Time   CALCIUM 9.2 01/09/2015 1109   CALCIUM 9.6 02/23/2014 1555   CALCIUM 8.8 01/14/2012 0900   ALKPHOS 110 01/09/2015 1109   ALKPHOS 96 02/23/2014 1555   ALKPHOS 94* 01/14/2012 0900   AST 16 01/09/2015 1109   AST 16 02/23/2014 1555   AST 20 01/14/2012 0900   ALT 16 01/09/2015 1109   ALT 18 02/23/2014 1555   ALT 22 01/14/2012 0900   BILITOT 0.32 01/09/2015 1109   BILITOT 0.5 02/23/2014 1555   BILITOT 0.50 01/14/2012 0900       RADIOGRAPHIC STUDIES: Ct Chest W Contrast  01/09/2015   CLINICAL DATA:  Lung cancer restaging  EXAM: CT CHEST WITH CONTRAST  TECHNIQUE: Multidetector CT imaging of the chest was performed during intravenous contrast administration.  CONTRAST:  9mL OMNIPAQUE IOHEXOL 300 MG/ML  SOLN  COMPARISON:  01/14/2012  FINDINGS: Mediastinum: The heart size appears normal. There is a small pericardial effusion,  similar to previous exam. There is no enlarged mediastinal or hilar lymph nodes. The trachea is patent and appears midline. Normal appearance of the esophagus.  Lungs/Pleura: No pleural effusion. Postoperative changes are identified in both lungs with associated volume loss. Changes of external beam radiation is again identified within the posterior and medial left upper lung zone. There are no specific features identified to suggest residual or recurrence of tumor.  Upper Abdomen: Incidental imaging through the upper abdomen is unremarkable. The visualized portions of the adrenal glands are normal.  Musculoskeletal: Review of the visualized osseous structures is significant for mild thoracic spondylosis. No aggressive lytic or sclerotic bone lesions identified.  IMPRESSION: 1. Stable CT of the chest. Surgical changes involving both lungs without specific features to suggest residual or recurrence of tumor.   Electronically Signed   By: Kerby Moors M.D.   On: 01/09/2015 13:24  Dg Toe 5th Right  12/25/2014   CLINICAL DATA:  Initial evaluation right fifth toe pain, injury 2 days ago, hit it on steps  EXAM: RIGHT FIFTH TOE  COMPARISON:  None.  FINDINGS: Mild deformity of the distal shaft of the fifth metatarsal without acute fracture, likely due to prior trauma. No other osseous abnormalities. Abnormal appearance to the third proximal phalanx which appears sclerotic with some loss of defined cortex at its articular surface with the proximal interphalangeal joint.  IMPRESSION: Deformity distal fifth metatarsal does not appear to be acute. No other fifth toe abnormalities.  Irregular appearance to the third proximal phalanx. Correlate for any symptoms that would suggest inflammatory arthritis or osteomyelitis in this area.   Electronically Signed   By: Skipper Cliche M.D.   On: 12/25/2014 16:52    ASSESSMENT AND PLAN: This is a very pleasant 59 years old with a stage IIIB non-small cell lung cancer status post  left upper lobectomy with lymph node dissection followed by concurrent chemoradiation diagnosed in December of 2000. The patient has been on observation for the last 16 years with no evidence of disease recurrence. The recent CT scan of the chest showed no evidence for disease recurrence. I discussed the scan results with the patient and recommended for her to continue on observation with routine follow-up visit by her primary care physician and probably annual chest x-ray if needed. I don't see a need for the patient to continue her routine follow-up visit with me at the [facility] at this point but will be happy to see her in the future if needed. The patient agreed to the current plan. She was advised to call me immediately if she has any concerning symptoms in the interval. The patient voices understanding of current disease status and treatment options and is in agreement with the current care plan.  All questions were answered. The patient knows to call the clinic with any problems, questions or concerns. We can certainly see the patient much sooner if necessary.  Disclaimer: This note was dictated with voice recognition software. Similar sounding words can inadvertently be transcribed and may not be corrected upon review.

## 2015-03-14 ENCOUNTER — Ambulatory Visit (INDEPENDENT_AMBULATORY_CARE_PROVIDER_SITE_OTHER): Payer: Medicare Other | Admitting: Pulmonary Disease

## 2015-03-14 ENCOUNTER — Encounter: Payer: Self-pay | Admitting: Pulmonary Disease

## 2015-03-14 VITALS — BP 124/70 | HR 77 | Ht 67.5 in | Wt 244.0 lb

## 2015-03-14 DIAGNOSIS — C3402 Malignant neoplasm of left main bronchus: Secondary | ICD-10-CM

## 2015-03-14 DIAGNOSIS — J984 Other disorders of lung: Secondary | ICD-10-CM | POA: Insufficient documentation

## 2015-03-14 MED ORDER — ALBUTEROL SULFATE HFA 108 (90 BASE) MCG/ACT IN AERS: INHALATION_SPRAY | RESPIRATORY_TRACT | Status: DC

## 2015-03-14 NOTE — Assessment & Plan Note (Signed)
Surprisingly, she remains cancer free-based on last imaging.

## 2015-03-14 NOTE — Assessment & Plan Note (Addendum)
She has restrictive lung disease related to surgery in both lungs, and radiotherapy to the left lung compounded by obesity. She has multiple pulmonary function testing done over the past few years which have demonstrated the same. Cardiopulmonary exercise testing also points towards deconditioning as the cause of her dyspnea rather than a pulmonary or a cardio vascular limitation to exercise. Lung capacity is stable Use albuterol MDI 1 puff every 12 has needed - can cause tremors & palpitations Pulm rehab referral to improve conditioning

## 2015-03-14 NOTE — Patient Instructions (Signed)
You have restrictive lung disease Lung capacity is stable Use albuterol MDI 1 puff every 12 has needed - can cause tremors & palpitations Pulm rehab referral

## 2015-03-14 NOTE — Progress Notes (Signed)
Subjective:    Patient ID: [name], female    DOB: 09/29/1956, 59 y.o.   MRN: [id number]  HPI  58/ F, with dyspnea is attributed to restrictive lung disease s/p RUlobectomy for spont pneumothx in 10/07, s/p chemo RT for stg III B lung cancer in 2001 - in remission since!  She has been out of work from 09/2008 - still battling workers' comp  Underwent rehab in 2010.   PFTs in 11/2006 and spirometry in 06/2008 consistent with moderate restrictive lung disease. Lung volumes were decreased as was diffusion capacity, but this corrected for alveolar volume.  Spirometry 09/2009 showed decline in FEV1 from 57 to 40%. PFT 10/2009 showed FEV1 51% & FVC 47%, DLCO was 47% but corrected for alveolar volume   Dust made her breathing worse, at times, and musty odor is enough to trigger her dyspnea.  labs showed transient elevated creatine kinase 509 and aldolase 12.3 in 10/09, which resolved by 11/09  Bronchodilators did not help, but caused tremors and palpitations.     03/14/2015   Chief Complaint  Patient presents with  . Advice Only    Former Patient of RA, last seen in 2012; SOB; DOE; chest tightness.     She presents again for worsening dyspnea on exertion. She developed syncope in 2015 and underwent cardiac evaluation (ross)  Stage IIIB, non-small cell lung cancer was diagnosed in 2001, by Dr. Arlyce Dice, and she subsequently underwent chemotherapy and radiation.   Significant tests/ events  CT scan from feb'11, has shown stable postoperative changes and nodular scarring in the left lung.   07/2006 right upper lobectomy and pleural abrasions for a spontaneous right pneumothorax by Dr. Roxan Hockey   01/2014 CPET >> decreased exercise tolerance, peak VO2 56% of predicted, no ventilatory limitation, no cardiac limitation-O2 pulse 73% predicted, preexercise PFTs-FVC 51%, FEV1 54%, ratio 84, MVV 65%  CT chest 12/2014 >> postop changes in both lungs, no evidence of tumor  recurrence.  Echo 12/2014 -mild AI, normal LV function  Past Medical History  Diagnosis Date  . Insomnia   . Nephrolithiasis   . Obesity   . Erythema nodosum   . Aortic insufficiency   . Allergy   . Anemia   . COPD (chronic obstructive pulmonary disease)   . Heart murmur   . Myalgia   . Kidney stones   . Aortic regurgitation   . Pulmonary fibrosis 01/20/2012    Due to previous surgery and chest radiation for lung cancer  . Pneumothorax, spontaneous, tension 01/20/2012    October, 2010  . lung ca dx'd 10/1999    chemo/xrt comp 05/29/2000  . 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 Diabetes mellitus     Past Surgical History  Procedure Laterality Date  . Chest surgery      bilateral lung  . Shoulder surgery      right  . Inner ear surgery      right   . Abdominal hysterectomy    . Colonoscopy    . Upper gastrointestinal endoscopy    . Total knee arthroplasty      RIGHT  . Carpal tunnel release      RIGHT in 1998/Left hand in 2009  . Pulmonary embolism surgery  2007    LUNG COLLAPSE  . Esophageal dilation    . Knee operation      LEFT LATERAL RELEASE  .  Foot surgery      Callous removed /Bil  . Appendectomy      Allergies  Allergen Reactions  . Hydrocodone Hives and Itching  . Morphine And Related Other (See Comments)    GI PROBLEMS  . Nsaids Other (See Comments)    GI PROBLEMS    History   Social History  . Marital Status: Single    Spouse Name: N/A  . Number of Children: 0  . Years of Education: N/A   Occupational History  . custodian    Social History Main Topics  . Smoking status: Former Smoker    Quit date: 07/22/1999  . Smokeless tobacco: Never Used  . Alcohol Use: No  . Drug Use: No  . Sexual Activity: Not on file   Other Topics Concern  . Not on file   Social History Narrative    Family History  Problem Relation Age of  Onset  . Heart disease Father   . Diabetes Father   . Pancreatic cancer Mother   . Diabetes Mother   . Hypertension Sister     x 3  . Diabetes Sister     x 4  . Diabetes Brother     Review of Systems  Constitutional: Negative for fever, chills and unexpected weight change.  HENT: Negative for congestion, dental problem, ear pain, nosebleeds, postnasal drip, rhinorrhea, sinus pressure, sneezing, sore throat, trouble swallowing and voice change.   Eyes: Negative for visual disturbance.  Respiratory: Positive for chest tightness and shortness of breath. Negative for cough and choking.   Cardiovascular: Negative for chest pain and leg swelling.  Gastrointestinal: Negative for vomiting, abdominal pain and diarrhea.  Genitourinary: Negative for difficulty urinating.  Musculoskeletal: Negative for arthralgias.  Skin: Negative for rash.  Neurological: Positive for headaches. Negative for tremors and syncope.  Hematological: Does not bruise/bleed easily.       Objective:   Physical Exam  Gen. Pleasant, obese, in no distress, normal affect ENT - no lesions, no post nasal drip, class 2-3 airway Neck: No JVD, no thyromegaly, no carotid bruits Lungs: no use of accessory muscles, no dullness to percussion, decreased without rales or rhonchi  Cardiovascular: Rhythm regular, heart sounds  normal, no murmurs or gallops, no peripheral edema Abdomen: soft and non-tender, no hepatosplenomegaly, BS normal. Musculoskeletal: No deformities, no cyanosis or clubbing Neuro:  alert, non focal, no tremors        Assessment & Plan:

## 2015-03-28 ENCOUNTER — Telehealth (HOSPITAL_COMMUNITY): Payer: Self-pay

## 2015-03-28 NOTE — Telephone Encounter (Signed)
Called patient regarding entrance to Pulmonary Rehab.  Patient states that they are interested in attending the program.  [name] is going to verify insurance coverage and follow up.

## 2015-04-10 ENCOUNTER — Encounter (HOSPITAL_COMMUNITY)
Admission: RE | Admit: 2015-04-10 | Discharge: 2015-04-10 | Disposition: A | Discharge status: Home / Self Care | Payer: Medicare Other | Source: Ambulatory Visit | Attending: Pulmonary Disease | Admitting: Pulmonary Disease

## 2015-04-10 VITALS — BP 125/59 | HR 82

## 2015-04-10 DIAGNOSIS — J984 Other disorders of lung: Secondary | ICD-10-CM | POA: Diagnosis present

## 2015-04-10 NOTE — Progress Notes (Signed)
[name] 59 y.o. female Pulmonary Rehab Orientation Note Patient arrived today in [facility] for orientation to Pulmonary Rehab. She was transported from [employer] via wheel chair. She does not carry portable oxygen. Per pt, she uses oxygen never. Color good, skin warm and dry. Patient is oriented to time and place. Patient's medical history and medications reviewed. Heart rate is normal, breath sounds clear to auscultation, no wheezes, rales, or rhonchi. Grip strength equal, strong. Distal pulses 2+ bilateral posterior tibial present. Patient reports she does take medications as prescribed. Patient states she follows a Diabetic diet. We discussed at length the importance of eating a balanced diet before coming to class and to bring her glucometer to check her CBG before and after exercise.   The patient reports no specific efforts to gain or lose weight.Patient's weight will be monitored closely. Demonstration and practice of PLB using pulse oximeter. Patient able to return demonstration satisfactorily. Safety and hand hygiene in the exercise area reviewed with patient. Patient voices understanding of the information reviewed. Department expectations discussed with patient and achievable goals were set. Patient works as a Psychologist, counselling for an elderly lady and she stays with her at night from 9 pm until 9 am daily.  She actually sleeps in a bedroom next to the client and attends to her needs at night.  The patient shows enthusiasm about attending the program and we look forward to working with this nice lady. The patient is scheduled for a 6 min walk test on Thursday, April 13, 2015 @ 3:30pm and to begin exercise on Tuesday, May 02, 2015 in the 1:30pm class due to a previously scheduled vacation.  [phone]

## 2015-04-13 NOTE — Addendum Note (Signed)
Addended by: Leanord Asal T on: 04/13/2015 02:29 PM   Modules accepted: Orders

## 2015-04-18 ENCOUNTER — Encounter: Payer: Self-pay | Admitting: Emergency Medicine

## 2015-04-18 ENCOUNTER — Encounter: Payer: Medicare Other | Admitting: Emergency Medicine

## 2015-04-18 ENCOUNTER — Ambulatory Visit (INDEPENDENT_AMBULATORY_CARE_PROVIDER_SITE_OTHER): Payer: Medicare Other | Admitting: Emergency Medicine

## 2015-04-18 DIAGNOSIS — E119 Type 2 diabetes mellitus without complications: Secondary | ICD-10-CM | POA: Diagnosis not present

## 2015-04-18 DIAGNOSIS — Z0000 Encounter for general adult medical examination without abnormal findings: Secondary | ICD-10-CM

## 2015-04-18 DIAGNOSIS — E669 Obesity, unspecified: Secondary | ICD-10-CM

## 2015-04-18 DIAGNOSIS — Z23 Encounter for immunization: Secondary | ICD-10-CM | POA: Diagnosis not present

## 2015-04-18 DIAGNOSIS — C3400 Malignant neoplasm of unspecified main bronchus: Secondary | ICD-10-CM

## 2015-04-18 LAB — CBC WITH DIFFERENTIAL/PLATELET
Basophils Absolute: 0 10*3/uL (ref 0.0–0.1)
Basophils Relative: 0 % (ref 0–1)
Eosinophils Absolute: 0.5 10*3/uL (ref 0.0–0.7)
Eosinophils Relative: 8 % — ABNORMAL HIGH (ref 0–5)
HCT: 37.9 % (ref 36.0–46.0)
Hemoglobin: 12.7 g/dL (ref 12.0–15.0)
Lymphocytes Relative: 28 % (ref 12–46)
Lymphs Abs: 1.8 10*3/uL (ref 0.7–4.0)
MCH: 30.9 pg (ref 26.0–34.0)
MCHC: 33.5 g/dL (ref 30.0–36.0)
MCV: 92.2 fL (ref 78.0–100.0)
MPV: 9.5 fL (ref 8.6–12.4)
Monocytes Absolute: 0.5 10*3/uL (ref 0.1–1.0)
Monocytes Relative: 8 % (ref 3–12)
Neutro Abs: 3.6 10*3/uL (ref 1.7–7.7)
Neutrophils Relative %: 56 % (ref 43–77)
Platelets: 314 10*3/uL (ref 150–400)
RBC: 4.11 MIL/uL (ref 3.87–5.11)
RDW: 13 % (ref 11.5–15.5)
WBC: 6.5 10*3/uL (ref 4.0–10.5)

## 2015-04-18 LAB — PULMONARY FUNCTION TEST

## 2015-04-18 LAB — POCT URINALYSIS DIPSTICK
Bilirubin, UA: NEGATIVE
Blood, UA: NEGATIVE
Glucose, UA: NEGATIVE
Ketones, UA: NEGATIVE
Leukocytes, UA: NEGATIVE
Nitrite, UA: NEGATIVE
Protein, UA: NEGATIVE
Spec Grav, UA: 1.025
Urobilinogen, UA: 0.2
pH, UA: 5

## 2015-04-18 LAB — COMPLETE METABOLIC PANEL WITH GFR
ALT: 13 U/L (ref 0–35)
AST: 14 U/L (ref 0–37)
Albumin: 3.9 g/dL (ref 3.5–5.2)
Alkaline Phosphatase: 111 U/L (ref 39–117)
BUN: 16 mg/dL (ref 6–23)
CO2: 29 mEq/L (ref 19–32)
Calcium: 9.2 mg/dL (ref 8.4–10.5)
Chloride: 103 mEq/L (ref 96–112)
Creat: 0.78 mg/dL (ref 0.50–1.10)
GFR, Est African American: >89 mL/min
GFR, Est Non African American: 85 mL/min
Glucose, Bld: 95 mg/dL (ref 70–99)
Potassium: 4.5 mEq/L (ref 3.5–5.3)
Sodium: 142 mEq/L (ref 135–145)
Total Bilirubin: 0.3 mg/dL (ref 0.2–1.2)
Total Protein: 7.1 g/dL (ref 6.0–8.3)

## 2015-04-18 LAB — LIPID PANEL
Cholesterol: 184 mg/dL (ref 0–200)
HDL: 50 mg/dL (ref >=46)
LDL Cholesterol: 104 mg/dL — ABNORMAL HIGH (ref 0–99)
Total CHOL/HDL Ratio: 3.7 Ratio
Triglycerides: 150 mg/dL — ABNORMAL HIGH (ref <150)
VLDL: 30 mg/dL (ref 0–40)

## 2015-04-18 LAB — TSH: TSH: 2.133 u[IU]/mL (ref 0.350–4.500)

## 2015-04-18 LAB — POCT GLYCOSYLATED HEMOGLOBIN (HGB A1C): Hemoglobin A1C: 7.5

## 2015-04-18 MED ORDER — ZOSTER VACCINE LIVE 19400 UNT/0.65ML ~~LOC~~ SOLR: 0.6500 mL | Freq: Once | SUBCUTANEOUS | Status: DC

## 2015-04-18 MED ORDER — PNEUMOCOCCAL 13-VAL CONJ VACC IM SUSP: 0.5000 mL | INTRAMUSCULAR | Status: DC

## 2015-04-18 NOTE — Progress Notes (Addendum)
Subjective:    Patient ID: [name], female    DOB: 1956-02-01, 58 y.o.   MRN: [id number] This chart was scribed for [name] Queen, MD by Zola Button, Medical Scribe. This patient was seen in room 21 and the patient's care was started at 1:52 PM.   HPI HPI Comments: [name] is a 59 y.o. female with a hx of lung cancer and DM who presents to the [facility] for a complete physical exam. Patient last ate around 11:30 AM and had a sugary beverage just prior to coming here.  Diabetes: Patient has been checking her blood sugar twice a day. Her diabetes is followed by Dr. [name]. She notes that her weight has been fluctuating.  Eye Care: Patient does see the eye doctor, Dr. Katy Fitch, once a year.   Well Woman Care: She sees her OB/GYN, Dr. Dellis Filbert, once a year. She has been staying on schedule with her mammograms; her next one is in October.  Lung Cancer: Patient had been released by her pulmonologist, but she felt she should not have been released as she had still been having SOB. She did see her pulmonologist, Dr. Elsworth Soho, again last month. Her oncologist was Dr. Julien Nordmann; she had been seeing Dr. Beryle Beams previously. She had been released by her oncologist.  Immunizations: She has had the Pneumococcal vaccine, but she has not had the Prevnar yet. She has not had the shingles vaccine.  Pulmonologist: Dr. Elsworth Soho Ophthalmologist: Dr. Katy Fitch OB/GYN: Dr. Dellis Filbert Oncologist: Dr. Julien Nordmann Cardiologist: Dr. Harrington Challenger Orthopedist: Dr. Berenice Primas Otolaryngologist: Dr. Thornell Mule Gastroenterologist: Dr. Edison Nasuti  Review of Systems 13 point ROS reviewed on patient health survey. Negative other than listed above or in nursing note. See nursing note.     Objective:   Physical Exam CONSTITUTIONAL: Well developed/well nourished HEAD: Normocephalic/atraumatic EYES: EOM/PERRL ENMT: Mucous membranes moist NECK: supple no meningeal signs SPINE: entire spine nontender CV: 2/6 systolic murmur at base  of heart LUNGS: Lungs are clear to auscultation bilaterally, no apparent distress ABDOMEN: Tenderness mid epigastrium and right upper abdomen GU: no cva tenderness NEURO: Pt is awake/alert, moves all extremitiesx4 EXTREMITIES: pulses normal, full ROM SKIN: warm, color normal PSYCH: no abnormalities of mood noted Results for orders placed or performed in visit on 04/18/15  POCT urinalysis dipstick  Result Value Ref Range   Color, UA Light Amber    Clarity, UA Clear    Glucose, UA Negative    Bilirubin, UA Negative    Ketones, UA Negative    Spec Grav, UA 1.025    Blood, UA Negative    pH, UA 5.0    Protein, UA negative    Urobilinogen, UA 0.2    Nitrite, UA Negative    Leukocytes, UA Negative Negative  POCT glycosylated hemoglobin (Hb A1C)  Result Value Ref Range   Hemoglobin A1C 7.5        Assessment & Plan:  1. Physical exam  - CBC with Differential/Platelet  2. Malignant neoplasm of hilus of lung, unspecified laterality This was found incidentally on a preemployment chest x-ray. She has done great with treatment. Her pulmonary function test Nadara Mustard does show significant restrictive lung disease  3. Type 2 diabetes mellitus without complication This is followed closely by Dr. Suzette Battiest. Her hemoglobin A1c was 7.5 - COMPLETE METABOLIC PANEL WITH GFR - Lipid panel - TSH - POCT urinalysis dipstick - POCT glycosylated hemoglobin (Hb A1C) - Microalbumin, urine  4. Obesity I again encouraged weight loss and working  through pulmonary rehabilitation and hopefully that will help her weight  5. Need for prophylactic vaccination against Streptococcus pneumoniae (pneumococcus)  - Pneumococcal conjugate vaccine 13-valent IM 6. Restrictive lung disease She will continue to work with Dr. Jearld Shines hopefully pulmonary rehabilitation will help her I personally performed the services described in this documentation, which was scribed in my presence. The recorded information has been  reviewed and is accurate.  [name] Queen, MD  [facility]  04/19/2015 9:07 AM

## 2015-04-19 ENCOUNTER — Encounter: Payer: Self-pay | Admitting: Emergency Medicine

## 2015-04-19 LAB — MICROALBUMIN, URINE: Microalb, Ur: 0.5 mg/dL (ref <2.0)

## 2015-04-20 ENCOUNTER — Telehealth: Payer: Self-pay | Admitting: *Deleted

## 2015-04-20 ENCOUNTER — Encounter: Payer: Medicare Other | Admitting: Emergency Medicine

## 2015-04-20 DIAGNOSIS — E113299 Type 2 diabetes mellitus with mild nonproliferative diabetic retinopathy without macular edema, unspecified eye: Secondary | ICD-10-CM

## 2015-04-20 DIAGNOSIS — H40013 Open angle with borderline findings, low risk, bilateral: Secondary | ICD-10-CM

## 2015-04-20 DIAGNOSIS — H10413 Chronic giant papillary conjunctivitis, bilateral: Secondary | ICD-10-CM

## 2015-04-20 NOTE — Telephone Encounter (Signed)
Faxed request for more recent (than 05/11/2014) diabetic eye exam to Dr. Zenia Resides office.  Will update health maintenance, etc once received.

## 2015-04-21 NOTE — Telephone Encounter (Signed)
Received response from [facility] - pt has OV 05/17/15 @ 1300.  Will follow up then.

## 2015-04-25 ENCOUNTER — Telehealth (HOSPITAL_COMMUNITY): Payer: Self-pay

## 2015-04-25 NOTE — Telephone Encounter (Signed)
Patient had to cancel first day of exercise at Pulmonary Rehab due to having to abruptly leave for [place] for a family emergency.  Called patient today and left a message inquiring about her return and when she will feel like coming to rehab.

## 2015-04-27 ENCOUNTER — Encounter (HOSPITAL_COMMUNITY)
Admission: RE | Admit: 2015-04-27 | Discharge: 2015-04-27 | Disposition: A | Discharge status: Home / Self Care | Payer: Medicare Other | Source: Ambulatory Visit | Attending: Pulmonary Disease | Admitting: Pulmonary Disease

## 2015-04-27 DIAGNOSIS — J984 Other disorders of lung: Secondary | ICD-10-CM | POA: Insufficient documentation

## 2015-04-27 NOTE — Progress Notes (Signed)
Adriel completed a Six-Minute Walk Test on 04/27/15 . Victoriah walked 1195 feet with 0 breaks.  The patient's lowest oxygen saturation was 94 %, highest heart rate was 118 bpm , and highest blood pressure was 166/78. The patient was on room air. Patient stated that nothing hindered their walk test.

## 2015-05-02 ENCOUNTER — Encounter (HOSPITAL_COMMUNITY)
Admission: RE | Admit: 2015-05-02 | Discharge: 2015-05-02 | Disposition: A | Discharge status: Home / Self Care | Payer: Medicare Other | Source: Ambulatory Visit | Attending: Pulmonary Disease | Admitting: Pulmonary Disease

## 2015-05-02 ENCOUNTER — Encounter: Payer: Self-pay | Admitting: Emergency Medicine

## 2015-05-02 ENCOUNTER — Encounter (HOSPITAL_COMMUNITY): Payer: Medicare Other

## 2015-05-02 DIAGNOSIS — J984 Other disorders of lung: Secondary | ICD-10-CM | POA: Diagnosis not present

## 2015-05-02 NOTE — Progress Notes (Signed)
Today, [name] exercised at [employer]. [facility]. Service time was from 1330 to 1500.  The patient exercised by performing aerobic, strengthening, and stretching exercises. Oxygen saturation, heart rate, blood pressure, rate of perceived exertion, and shortness of breath were all monitored before, during, and after exercise. [name] presented with no problems at today's exercise session.  The patient did have an increase in workload intensity during today's exercise session.  Pre-exercise vitals: . Weight kg: 111.8 . Liters of O2: RA . SpO2: 99 . HR: 98 . BP: 120/76 . CBG: 214  Exercise vitals: . Highest heartrate:  110 . Lowest oxygen saturation: 64 . Highest blood pressure: 134/70 . Liters of 02: RA  Post-exercise vitals: . SpO2: 98 . HR: 100 . BP: 128/82 . Liters of O2: RA . CBG: 275 Dr. Brand Males, Medical Director Dr. Coralyn Pear is immediately available during today's Pulmonary Rehab session for [name] on 05/02/2015  at 1330 class time.  [name]

## 2015-05-04 ENCOUNTER — Encounter (HOSPITAL_COMMUNITY)
Admission: RE | Admit: 2015-05-04 | Discharge: 2015-05-04 | Disposition: A | Discharge status: Home / Self Care | Payer: Medicare Other | Source: Ambulatory Visit | Attending: Pulmonary Disease | Admitting: Pulmonary Disease

## 2015-05-04 DIAGNOSIS — J984 Other disorders of lung: Secondary | ICD-10-CM | POA: Diagnosis not present

## 2015-05-04 NOTE — Progress Notes (Signed)
Today, [name] exercised at [employer]. Service time was from 1030 to 1225.  The patient exercised by performing aerobic, strengthening, and stretching exercises. Oxygen saturation, heart rate, blood pressure, rate of perceived exertion, and shortness of breath were all monitored before, during, and after exercise. [name] presented with no problems at today's exercise session. She attended warning signs and symptoms class today.  The patient did not  have an increase in workload intensity during today's exercise session.  Pre-exercise vitals: . Weight kg: 111.6 . Liters of O2: RA . SpO2: 97 . HR: 89 . BP: 144/78 . CBG: 194  Exercise vitals: . Highest heartrate:  105 . Lowest oxygen saturation: 95 . Highest blood pressure: 140/80 . Liters of 02: RA  Post-exercise vitals: . SpO2: 98 . HR: 73 . BP: 124/68 . Liters of O2: RA . CBG: 285 Dr. Brand Males, Medical Director Dr. Dyann Kief is immediately available during today's Pulmonary Rehab session for [name] on 05/04/2015  at 1030 class time.  [name]

## 2015-05-09 ENCOUNTER — Encounter (HOSPITAL_COMMUNITY)
Admission: RE | Admit: 2015-05-09 | Discharge: 2015-05-09 | Disposition: A | Discharge status: Home / Self Care | Payer: Medicare Other | Source: Ambulatory Visit | Attending: Pulmonary Disease | Admitting: Pulmonary Disease

## 2015-05-09 DIAGNOSIS — J984 Other disorders of lung: Secondary | ICD-10-CM | POA: Diagnosis not present

## 2015-05-09 NOTE — Progress Notes (Signed)
Today, [name] exercised at [employer]. Service time was from 1:30pm to 3:00pm.  The patient exercised by performing aerobic, strengthening, and stretching exercises. Oxygen saturation, heart rate, blood pressure, rate of perceived exertion, and shortness of breath were all monitored before, during, and after exercise. [name] presented with no problems at today's exercise session.  The patient did have an increase in workload intensity during today's exercise session.  Pre-exercise vitals: . Weight kg: 110.4 . Liters of O2: ra . SpO2: 98 . HR: 86 . BP: 120/62 . CBG: 127  Exercise vitals: . Highest heartrate:  124 . Lowest oxygen saturation: 96 . Highest blood pressure: 14074 . Liters of 02: ra  Post-exercise vitals: . SpO2: 95 . HR: 100 . BP: 106/67 . Liters of O2: ra . CBG: 220  Dr. Brand Males, Medical Director Dr. Dyann Kief is immediately available during today's Pulmonary Rehab session for [name] on 05/09/15 at 1:30pm class time.

## 2015-05-11 ENCOUNTER — Encounter (HOSPITAL_COMMUNITY): Payer: Medicare Other

## 2015-05-12 LAB — HM DIABETES EYE EXAM

## 2015-05-16 ENCOUNTER — Encounter (HOSPITAL_COMMUNITY)
Admission: RE | Admit: 2015-05-16 | Discharge: 2015-05-16 | Disposition: A | Discharge status: Home / Self Care | Payer: Medicare Other | Source: Ambulatory Visit | Attending: Pulmonary Disease | Admitting: Pulmonary Disease

## 2015-05-16 DIAGNOSIS — J984 Other disorders of lung: Secondary | ICD-10-CM | POA: Diagnosis not present

## 2015-05-16 NOTE — Progress Notes (Signed)
Today, Posie exercised at [employer]. Service time was from 1330 to 1525.  The patient exercised by performing aerobic, strengthening, and stretching exercises. Oxygen saturation, heart rate, blood pressure, rate of perceived exertion, and shortness of breath were all monitored before, during, and after exercise. Juliann presented with no problems at today's exercise session.  Patient attended the purse lip breathing class today.  The patient did not have an increase in workload intensity during today's exercise session.  Pre-exercise vitals: . Weight kg: 113.3 . Liters of O2: 97 . SpO2: 97 . HR: 108 . BP: 110/76 . CBG: 105  Given a snack of peanut butter and lemonade  Exercise vitals: . Highest heartrate:  118 . Lowest oxygen saturation: 96 . Highest blood pressure: 140/64 . Liters of 02: RA  Post-exercise vitals: . SpO2: 95 . HR: 98 . BP: 128/76 . Liters of O2: RA . CBG: 185 Dr. Brand Males, Medical Director Dr. Coralyn Pear is immediately available during today's Pulmonary Rehab session for ALAYZHA AN on 05/16/2015  at 1330 class time.  [name]

## 2015-05-18 ENCOUNTER — Encounter (HOSPITAL_COMMUNITY)
Admission: RE | Admit: 2015-05-18 | Discharge: 2015-05-18 | Disposition: A | Discharge status: Home / Self Care | Payer: Medicare Other | Source: Ambulatory Visit | Attending: Pulmonary Disease | Admitting: Pulmonary Disease

## 2015-05-18 NOTE — Progress Notes (Signed)
Pulmonary Rehab Note Patient did not exercise today, she is diabetic and has only eaten 2 cookies, her CBG was 144.  She was encouraged to go and eat lunch and in the future to eat a balanced lunch before exercising with a protein rich meal.

## 2015-05-22 NOTE — Telephone Encounter (Signed)
Phoned [facility] and spoke with Jackelyn Poling - pt rescheduled OV from 7/27 to tomorrow, 8/2 @ 1.  She has flagged chart to ensure they remember to fax me a copy of her DEE report.

## 2015-05-23 ENCOUNTER — Encounter (HOSPITAL_COMMUNITY): Payer: Medicare Other

## 2015-05-23 DIAGNOSIS — E113299 Type 2 diabetes mellitus with mild nonproliferative diabetic retinopathy without macular edema, unspecified eye: Secondary | ICD-10-CM | POA: Insufficient documentation

## 2015-05-23 DIAGNOSIS — H40013 Open angle with borderline findings, low risk, bilateral: Secondary | ICD-10-CM | POA: Insufficient documentation

## 2015-05-23 DIAGNOSIS — H10413 Chronic giant papillary conjunctivitis, bilateral: Secondary | ICD-10-CM | POA: Insufficient documentation

## 2015-05-23 LAB — HM DIABETES EYE EXAM

## 2015-05-24 MED ORDER — KETOTIFEN FUMARATE 0.025 % OP SOLN: 1.0000 [drp] | Freq: Two times a day (BID) | OPHTHALMIC | Status: DC

## 2015-05-24 NOTE — Telephone Encounter (Signed)
Received faxed copy of diabetic eye exam from [facility] dated 05/23/15 - see problem list for updated health maintenance & results.  Updated health maintenance, abstracted and sent to PCP for review.

## 2015-05-24 NOTE — Telephone Encounter (Signed)
Thank you. I think you should call Dr. Zenia Resides office for clarification.

## 2015-05-25 ENCOUNTER — Encounter (HOSPITAL_COMMUNITY): Payer: Medicare Other

## 2015-05-25 NOTE — Telephone Encounter (Signed)
Phoned [facility], spoke to [place], explained need for clarification, and she is getting a tech (after reviewing the notes herself, she could understand the need for clarification).  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  Verbal read back with confirmation.

## 2015-05-30 ENCOUNTER — Observation Stay (HOSPITAL_COMMUNITY)
Admission: EM | Admit: 2015-05-30 | Discharge: 2015-06-01 | Disposition: A | Discharge status: Home / Self Care | Payer: Medicare Other | Attending: Family Medicine | Admitting: Family Medicine

## 2015-05-30 ENCOUNTER — Ambulatory Visit (INDEPENDENT_AMBULATORY_CARE_PROVIDER_SITE_OTHER): Payer: Medicare Other

## 2015-05-30 ENCOUNTER — Emergency Department (HOSPITAL_COMMUNITY): Payer: Medicare Other

## 2015-05-30 ENCOUNTER — Encounter (HOSPITAL_COMMUNITY): Payer: Self-pay | Admitting: Emergency Medicine

## 2015-05-30 ENCOUNTER — Encounter (HOSPITAL_COMMUNITY)
Admission: RE | Admit: 2015-05-30 | Discharge: 2015-05-30 | Disposition: A | Discharge status: Home / Self Care | Payer: Medicare Other | Source: Ambulatory Visit | Attending: Pulmonary Disease | Admitting: Pulmonary Disease

## 2015-05-30 ENCOUNTER — Encounter: Payer: Self-pay | Admitting: Physician Assistant

## 2015-05-30 ENCOUNTER — Other Ambulatory Visit: Payer: Self-pay

## 2015-05-30 VITALS — BP 140/78 | HR 105

## 2015-05-30 DIAGNOSIS — J701 Chronic and other pulmonary manifestations due to radiation: Secondary | ICD-10-CM | POA: Diagnosis not present

## 2015-05-30 DIAGNOSIS — Z923 Personal history of irradiation: Secondary | ICD-10-CM | POA: Insufficient documentation

## 2015-05-30 DIAGNOSIS — Z96651 Presence of right artificial knee joint: Secondary | ICD-10-CM | POA: Diagnosis not present

## 2015-05-30 DIAGNOSIS — H10413 Chronic giant papillary conjunctivitis, bilateral: Secondary | ICD-10-CM | POA: Diagnosis not present

## 2015-05-30 DIAGNOSIS — Z85118 Personal history of other malignant neoplasm of bronchus and lung: Secondary | ICD-10-CM | POA: Insufficient documentation

## 2015-05-30 DIAGNOSIS — Z6838 Body mass index (BMI) 38.0-38.9, adult: Secondary | ICD-10-CM | POA: Diagnosis not present

## 2015-05-30 DIAGNOSIS — R0789 Other chest pain: Secondary | ICD-10-CM | POA: Diagnosis not present

## 2015-05-30 DIAGNOSIS — J984 Other disorders of lung: Secondary | ICD-10-CM | POA: Diagnosis not present

## 2015-05-30 DIAGNOSIS — R079 Chest pain, unspecified: Secondary | ICD-10-CM | POA: Diagnosis present

## 2015-05-30 DIAGNOSIS — R109 Unspecified abdominal pain: Secondary | ICD-10-CM | POA: Insufficient documentation

## 2015-05-30 DIAGNOSIS — J90 Pleural effusion, not elsewhere classified: Secondary | ICD-10-CM | POA: Diagnosis not present

## 2015-05-30 DIAGNOSIS — Z79899 Other long term (current) drug therapy: Secondary | ICD-10-CM | POA: Diagnosis not present

## 2015-05-30 DIAGNOSIS — I3139 Other pericardial effusion (noninflammatory): Secondary | ICD-10-CM | POA: Insufficient documentation

## 2015-05-30 DIAGNOSIS — Z87891 Personal history of nicotine dependence: Secondary | ICD-10-CM | POA: Insufficient documentation

## 2015-05-30 DIAGNOSIS — E669 Obesity, unspecified: Secondary | ICD-10-CM | POA: Insufficient documentation

## 2015-05-30 DIAGNOSIS — G8929 Other chronic pain: Secondary | ICD-10-CM | POA: Insufficient documentation

## 2015-05-30 DIAGNOSIS — Z794 Long term (current) use of insulin: Secondary | ICD-10-CM | POA: Insufficient documentation

## 2015-05-30 DIAGNOSIS — R0602 Shortness of breath: Secondary | ICD-10-CM | POA: Diagnosis not present

## 2015-05-30 DIAGNOSIS — Z9221 Personal history of antineoplastic chemotherapy: Secondary | ICD-10-CM | POA: Diagnosis not present

## 2015-05-30 DIAGNOSIS — E11329 Type 2 diabetes mellitus with mild nonproliferative diabetic retinopathy without macular edema: Secondary | ICD-10-CM | POA: Diagnosis not present

## 2015-05-30 DIAGNOSIS — I313 Pericardial effusion (noninflammatory): Secondary | ICD-10-CM | POA: Insufficient documentation

## 2015-05-30 LAB — COMPREHENSIVE METABOLIC PANEL
ALT: 15 U/L (ref 14–54)
AST: 23 U/L (ref 15–41)
Albumin: 3.5 g/dL (ref 3.5–5.0)
Alkaline Phosphatase: 112 U/L (ref 38–126)
Anion gap: 9 (ref 5–15)
BUN: 16 mg/dL (ref 6–20)
CO2: 25 mmol/L (ref 22–32)
Calcium: 9.2 mg/dL (ref 8.9–10.3)
Chloride: 104 mmol/L (ref 101–111)
Creatinine, Ser: 0.98 mg/dL (ref 0.44–1.00)
GFR calc Af Amer: >60 mL/min (ref >60)
GFR calc non Af Amer: >60 mL/min (ref >60)
Glucose, Bld: 191 mg/dL — ABNORMAL HIGH (ref 65–99)
Potassium: 3.9 mmol/L (ref 3.5–5.1)
Sodium: 138 mmol/L (ref 135–145)
Total Bilirubin: 0.5 mg/dL (ref 0.3–1.2)
Total Protein: 6.8 g/dL (ref 6.5–8.1)

## 2015-05-30 LAB — CBC WITH DIFFERENTIAL/PLATELET
Basophils Absolute: 0 10*3/uL (ref 0.0–0.1)
Basophils Relative: 0 % (ref 0–1)
Eosinophils Absolute: 0.3 10*3/uL (ref 0.0–0.7)
Eosinophils Relative: 5 % (ref 0–5)
HCT: 37.6 % (ref 36.0–46.0)
Hemoglobin: 12.6 g/dL (ref 12.0–15.0)
Lymphocytes Relative: 30 % (ref 12–46)
Lymphs Abs: 1.8 10*3/uL (ref 0.7–4.0)
MCH: 31 pg (ref 26.0–34.0)
MCHC: 33.5 g/dL (ref 30.0–36.0)
MCV: 92.6 fL (ref 78.0–100.0)
Monocytes Absolute: 0.4 10*3/uL (ref 0.1–1.0)
Monocytes Relative: 6 % (ref 3–12)
Neutro Abs: 3.6 10*3/uL (ref 1.7–7.7)
Neutrophils Relative %: 59 % (ref 43–77)
Platelets: 273 10*3/uL (ref 150–400)
RBC: 4.06 MIL/uL (ref 3.87–5.11)
RDW: 12.1 % (ref 11.5–15.5)
WBC: 6.1 10*3/uL (ref 4.0–10.5)

## 2015-05-30 LAB — TROPONIN I: Troponin I: <0.03 ng/mL (ref <0.031)

## 2015-05-30 LAB — BRAIN NATRIURETIC PEPTIDE: B Natriuretic Peptide: 35 pg/mL (ref 0.0–100.0)

## 2015-05-30 LAB — D-DIMER, QUANTITATIVE: D-Dimer, Quant: 0.71 ug/mL-FEU — ABNORMAL HIGH (ref 0.00–0.48)

## 2015-05-30 MED ORDER — HYDROMORPHONE HCL 2 MG PO TABS
2.0000 mg | ORAL_TABLET | ORAL | Status: DC | PRN
  Administered 2015-05-31 (×4): 2 mg via ORAL
  Filled 2015-05-30 (×5): qty 1

## 2015-05-30 MED ORDER — ASPIRIN EC 81 MG PO TBEC
81.0000 mg | DELAYED_RELEASE_TABLET | Freq: Every day | ORAL | Status: DC
  Administered 2015-05-31 – 2015-06-01 (×2): 81 mg via ORAL
  Filled 2015-05-30 (×2): qty 1

## 2015-05-30 MED ORDER — IOHEXOL 350 MG/ML SOLN
80.0000 mL | Freq: Once | INTRAVENOUS | Status: AC | PRN
  Administered 2015-05-30: 100 mL via INTRAVENOUS

## 2015-05-30 NOTE — Progress Notes (Signed)
Today, [name] exercised at [employer]. Service time was from 1330 to 1500.  The patient exercised by performing aerobic, strengthening, and stretching exercises. Oxygen saturation, heart rate, blood pressure, rate of perceived exertion, and shortness of breath were all monitored before, during, and after exercise. [name] presented with no problems at today's exercise session.  The patient did  have an increase in workload intensity during today's exercise session.  Pre-exercise vitals: . Weight kg: 111.9 . Liters of O2: RA . SpO2: 100 . HR: 95 . BP: 136/70 . CBG: 167  Exercise vitals: . Highest heartrate:  129 . Lowest oxygen saturation: 96 . Highest blood pressure: 142/70 . Liters of 02: RA  Post-exercise vitals: . SpO2: 96 . HR: 102 . BP: 114/66 . Liters of O2: RA . CBG: 186 Dr. Brand Males, Medical Director Dr. Frederic Jericho is immediately available during today's Pulmonary Rehab session for [name] on 05/30/2015  at 1330 class time.  [name]  [name]

## 2015-05-30 NOTE — Progress Notes (Signed)
Pt presented to our office today from Rehab to schedule a Dr. Hilaria Ota.  While scheduling her appointment pt expressed that she was having chest pain and did not feel well.  RN assessed pt, she was seemed SOB when talking to me.  Pt was taken to exam room where VS and EKG were taken.  BP 140/78 HR 105 Oxygen 97 on room air.  Pt stated that pain is sharp in nature and pressure on her chest. Pt stated that she does have sharp pain down her left arm from time to time but it is related to her carpal tunnel surgery she had, she thinks.  From time to time pt has fluttery feeling with the chest pain and some times she gets clammy.  When she stops or bends over and does her perused lip breathing it most times will go away.  Most time she recognizes that the pt get the CP on exertion or when she is rushing.  Pt concerns, VS, and BP reviewed with Flex, recommend pt go to the ED to get blood work and be evaluated as she is in active chest pain now. Pt drove herself to the office and will need to be transported by EMS.  Reviewed with pt as she had stated when arriving she did not want to go out by EMS, that she needs to be evaluated and we cannot let her drive and if she insist she will need to complete an AMA paperwork. Pt agreed to go by EMS.  EMS arrived and provided pt paperwork.

## 2015-05-30 NOTE — ED Notes (Addendum)
Per EMS at [place] office for stress test and had chest pain during test.  Pain is 0 sitting forward, 6 left-center chest when sitting back, no migration.  Patient ambulatory and in no apparent distress at this time.

## 2015-05-30 NOTE — H&P (Signed)
[facility] Admission History and Physical Service Pager: [phone]  Patient name: [name] Medical record number: [id number] Date of birth: 02-23-1956 Age: 59 y.o. Gender: female  Primary Care Provider: Jenny Reichmann, MD Consultants: Cardiology Code Status: Full  Chief Complaint:  Chest pain  Assessment and Plan: [name] is a 59 y.o. female presenting with chest pain. 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 Pt is followed by cardiology with negative work up for these symptoms in 12/2014. Echo 12/2014 showed normal LV function with mild aortic insufficiency. Pain has been longstanding, with documentation dating to 02/2012. She has since had extensive work up including negative myoview in 2013 and echo. Potentially related to pericardial effusion, after speaking with Radiology it is mildly larger than when seen in 12/2014. Consider inflammatory component, ie pericarditis, vs post radiation changes as predisposing to worsening effusion -  Cardiology consult in AM for chest pain in setting of pericardial effusion - trend troponin - repeat EKG in AM - if all negative consider treatment for pericarditis  T2DM- Last A1c 7.5 03/2015 - SSI - CBG ACHS  COPD- Stable, no wheezing, with normal oxygenation without supplemental O2 - albuterol PRN - Supplemental O2 PRN  FEN/GI: Heart/Carb modified diet, KVO Prophylaxis: heparin DVT ppx  Disposition: Admit to Family medicine teaching service, Dr Nori Riis Attending  History of Present Illness: [name] is a 59 y.o. female presenting  with  Chest pain that strated during pulmonary rehab today (while active on exercise bike). It was sharp in nature and lasted for several seconds. She did have SOB associated.The pain was located over her left chest and extended to her left neck. She had some shortness of breath associated for the few seconds the pain was at its worst. She had a cardiology appointment after rehab so she went there and was then sent to the ED for evaluation. She states tat she has been having intermittent chest pain like this since January 2016, worsened in March 2016 and at that time had work up that included a echo which showed normal LV function and mild aortic insuficiency. Her pain improves when she leans forward. Pain is usually brought on by activity (dressing, exercise) and has in past resolved with taking deep breaths and resting. Today her pain to her left neck persisted and is now 5-6/10, continued improvement in ED, seems to have a constant low level pain with intermittent brief episodes of sharp pain as above.  Interval history with long-standing multiple pulmonary problems since 2001 with dx lung ca, s/p multiple therapies including surgical resection, chemo, radiation. Previously did pulmonary rehab in 2007, has had symptoms with SOB since this time, however the chest pain above is the more recent finding also in setting of her chronic SOB with some worsening.  Denies lightheadedness, nausea/emesis.She does have chronic abdominal pain Denies recent illnesses, fever/chills  Review Of Systems: Per HPI    Patient Active Problem List   Diagnosis Date Noted  . 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  Open angle with borderline findings and low glaucoma risk in both eyes 05/23/2015  . Restrictive lung disease 03/14/2015  . 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 Insomnia   . Obesity   . Anemia   .  Kidney stones   . AORTIC INSUFFICIENCY 09/05/2010  . UNDIAGNOSED CARDIAC MURMURS 02/24/2009  . Lung cancer 09/20/2008  . DYSPNEA 09/20/2008  . DIABETES MELLITUS, TYPE II 06/19/2007   Past Medical History: Past Medical History  Diagnosis Date  . Insomnia   . Nephrolithiasis   . Obesity   . Erythema nodosum   . Aortic insufficiency   . Allergy   . Anemia   . COPD (chronic obstructive pulmonary disease)   . Heart murmur   . Myalgia   . Kidney stones   . Aortic regurgitation   . Pulmonary fibrosis 01/20/2012    Due to previous surgery and chest radiation for lung cancer  . Pneumothorax, spontaneous, tension 01/20/2012    October, 2010  . lung ca dx'd 10/1999    chemo/xrt comp 05/29/2000  . 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 Diabetes mellitus    Past Surgical History: Past Surgical History  Procedure Laterality Date  . Chest surgery      bilateral lung  . Shoulder surgery      right  . Inner ear surgery      right   . Abdominal hysterectomy    . Colonoscopy    . Upper gastrointestinal endoscopy    . Total knee arthroplasty      RIGHT  . Carpal tunnel release      RIGHT in 1998/Left hand in 2009  . Pulmonary embolism surgery  2007    LUNG COLLAPSE  . Esophageal dilation    . Knee operation      LEFT LATERAL RELEASE  . Foot surgery      Callous removed /Bil  . Appendectomy     Social History: History  Substance Use Topics  . Smoking status: Former Smoker    Quit date: 07/22/1999  . Smokeless tobacco: Never Used  . Alcohol Use: No   Additional social history: Denies drug use, retired, lives alone Please also refer to relevant sections of EMR.  Family History: Family History  Problem Relation Age of Onset  . Heart disease Father   . Diabetes Father   . Pancreatic cancer Mother   . Diabetes Mother   . Hypertension Sister     x 3  . Diabetes  Sister     x 4  . Diabetes Maternal Grandmother   . Diabetes Paternal Grandmother    Allergies and Medications: Allergies  Allergen Reactions  . Hydrocodone Hives and Itching  . Morphine And Related Other (See Comments)    GI PROBLEMS  . Nsaids Other (See Comments)    GI PROBLEMS   No current facility-administered medications on file prior to encounter.   Current Outpatient Prescriptions on File Prior to Encounter  Medication Sig Dispense Refill  . albuterol (PROVENTIL HFA;VENTOLIN HFA) 108 (90 BASE) MCG/ACT inhaler Every 12 hours as needed 1 Inhaler 6  . B-D UF III MINI PEN NEEDLES 31G X 5 MM MISC 2 (two) times daily. as directed  5  . insulin aspart protamine- aspart (NOVOLOG MIX 70/30) (70-30) 100 UNIT/ML injection 35 units in the morning and 25 units at night    . ketotifen (ZADITOR)  0.025 % ophthalmic solution Place 1 drop into both eyes 2 (two) times daily. 5 mL 0    Objective: BP 131/59 mmHg  Pulse 90  Temp(Src) 97.9 F (36.6 C) (Oral)  Resp 24  SpO2 98% Exam: General: Sitting up in bed, comfortable, NAD HEENT: EOMI, no cervical lymphadenopathy, MMM Cardiovascular: RRR, chronic 2/6 systolic ejection murmur Respiratory: CTAB, without focal crackles, wheezing, rhonchi, normal WOB, good air movement. Speaks full sentences without resp distress. Abdomen: soft, mildy tender at the epigastrum, no rebound or gaurding, + BS Extremities: 2+ pedal pulses, no LE edema, calves non-tender, no erythema, Homan's negative Skin: no rashes noted Neuro: AOx4, no focal deficits   Labs and Imaging: CBC BMET   Recent Labs Lab 05/30/15 1635  WBC 6.1  HGB 12.6  HCT 37.6  PLT 273    Recent Labs Lab 05/30/15 1635  NA 138  K 3.9  CL 104  CO2 25  BUN 16  CREATININE 0.98  GLUCOSE 191*  CALCIUM 9.2     CT chest 8/9 IMPRESSION: 1. Negative for pulmonary embolus or acute aortic abnormality. 2. Cardiomegaly and small to moderate chronic pericardial effusion. Tiny RIGHT  pleural effusion. 3. Postsurgical scarring in both upper lobes, greater on the LEFT when compared to the RIGHT. No change from chest CT 01/09/2015.  CXR 8/9 IMPRESSION: Postsurgical changes in both lungs greatest in LEFT upper lobe.  No acute abnormalities.  Veatrice Bourbon, MD 05/30/2015, 10:04 PM PGY-2, [place] Intern pager: [phone], text pages welcome  Upper Level Addendum:  I have seen and evaluated this patient along with Dr. Lincoln Brigham and reviewed the above note, making necessary revisions in purple.  Nobie Putnam, [place], PGY-3

## 2015-05-30 NOTE — ED Provider Notes (Signed)
CSN: [id number]     Arrival date & time 05/30/15  34 History   First MD Initiated Contact with Patient 05/30/15 1626     Chief Complaint  Patient presents with  . Chest Pain     (Consider location/radiation/quality/duration/timing/severity/associated sxs/prior Treatment) HPI Comments: Patient presents to the emergency department for evaluation of chest pain. Patient reports that she was sent here from cardiology office for evaluation of acute chest pain. Patient reports that she has been having intermittent episodes similar to this since March. She has intermittent episodes of fluttering in her chest accompanied by sharp stabbing pains in the left chest that lasts for several seconds at a time. She feels short of breath when this occurs, performs personal breathing and the pain and fluttering resolves.  Patient performed pulmonary rehabilitation today. After rehabilitation she went to cardiologist's office to schedule appointment with the symptoms began again and was referred to the ER.  Patient is a 59 y.o. female presenting with chest pain.  Chest Pain Associated symptoms: palpitations and shortness of breath     Past Medical History  Diagnosis Date  . Insomnia   . Nephrolithiasis   . Obesity   . Erythema nodosum   . Aortic insufficiency   . Allergy   . Anemia   . COPD (chronic obstructive pulmonary disease)   . Heart murmur   . Myalgia   . Kidney stones   . Aortic regurgitation   . Pulmonary fibrosis 01/20/2012    Due to previous surgery and chest radiation for lung cancer  . Pneumothorax, spontaneous, tension 01/20/2012    October, 2010  . lung ca dx'd 10/1999    chemo/xrt comp 05/29/2000  . 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 Diabetes mellitus    Past Surgical History  Procedure Laterality Date  . Chest surgery      bilateral lung  . Shoulder surgery       right  . Inner ear surgery      right   . Abdominal hysterectomy    . Colonoscopy    . Upper gastrointestinal endoscopy    . Total knee arthroplasty      RIGHT  . Carpal tunnel release      RIGHT in 1998/Left hand in 2009  . Pulmonary embolism surgery  2007    LUNG COLLAPSE  . Esophageal dilation    . Knee operation      LEFT LATERAL RELEASE  . Foot surgery      Callous removed /Bil  . Appendectomy     Family History  Problem Relation Age of Onset  . Heart disease Father   . Diabetes Father   . Pancreatic cancer Mother   . Diabetes Mother   . Hypertension Sister     x 3  . Diabetes Sister     x 4  . Diabetes Maternal Grandmother   . Diabetes Paternal Grandmother    History  Substance Use Topics  . Smoking status: Former Smoker    Quit date: 07/22/1999  . Smokeless tobacco: Never Used  . Alcohol Use: No   OB History    No data available     Review of Systems  Respiratory: Positive for shortness of breath.   Cardiovascular: Positive for chest pain and palpitations.  All other systems reviewed and are negative.     Allergies  Hydrocodone; Morphine and related; and  Nsaids  Home Medications   Prior to Admission medications   Medication Sig Start Date End Date Taking? Authorizing Provider  albuterol (PROVENTIL HFA;VENTOLIN HFA) 108 (90 BASE) MCG/ACT inhaler Every 12 hours as needed 03/14/15   Rigoberto Noel, MD  B-D UF III MINI PEN NEEDLES 31G X 5 MM MISC 2 (two) times daily. as directed 01/07/15   Historical Provider, MD  insulin aspart protamine- aspart (NOVOLOG MIX 70/30) (70-30) 100 UNIT/ML injection 35 units in the morning and 25 units at night    Historical Provider, MD  ketotifen (ZADITOR) 0.025 % ophthalmic solution Place 1 drop into both eyes 2 (two) times daily. 05/24/15   [name], MD   BP 146/51 mmHg  Pulse 83  Temp(Src) 97.9 F (36.6 C) (Oral)  Resp 22  SpO2 100% Physical Exam  Constitutional: She is oriented to person, place, and time.  She appears well-developed and well-nourished. No distress.  HENT:  Head: Normocephalic and atraumatic.  Right Ear: Hearing normal.  Left Ear: Hearing normal.  Nose: Nose normal.  Mouth/Throat: Oropharynx is clear and moist and mucous membranes are normal.  Eyes: Conjunctivae and EOM are normal. Pupils are equal, round, and reactive to light.  Neck: Normal range of motion. Neck supple.  Cardiovascular: Regular rhythm, S1 normal and S2 normal.  Exam reveals no gallop and no friction rub.   No murmur heard. Pulmonary/Chest: Effort normal and breath sounds normal. No respiratory distress. She exhibits no tenderness.  Abdominal: Soft. Normal appearance and bowel sounds are normal. There is no hepatosplenomegaly. There is no tenderness. There is no rebound, no guarding, no tenderness at McBurney's point and negative Murphy's sign. No hernia.  Musculoskeletal: Normal range of motion.  Neurological: She is alert and oriented to person, place, and time. She has normal strength. No cranial nerve deficit or sensory deficit. Coordination normal. GCS eye subscore is 4. GCS verbal subscore is 5. GCS motor subscore is 6.  Skin: Skin is warm, dry and intact. No rash noted. No cyanosis.  Psychiatric: She has a normal mood and affect. Her speech is normal and behavior is normal. Thought content normal.  Nursing note and vitals reviewed.   ED Course  Procedures (including critical care time) Labs Review Labs Reviewed  COMPREHENSIVE METABOLIC PANEL - Abnormal; Notable for the following:    Glucose, Bld 191 (*)    All other components within normal limits  D-DIMER, QUANTITATIVE (NOT AT [employer]) - Abnormal; Notable for the following:    D-Dimer, Quant 0.71 (*)    All other components within normal limits  CBC WITH DIFFERENTIAL/PLATELET  TROPONIN I  BRAIN NATRIURETIC PEPTIDE    Imaging Review Dg Chest 2 View  05/30/2015   CLINICAL DATA:  Chest pain for 1 day, history diabetes mellitus, lung cancer, COPD,  pulmonary fibrosis  EXAM: CHEST  2 VIEW  COMPARISON:  01/13/2013  FINDINGS: Upper normal heart size.  Normal mediastinal contours and pulmonary vascularity.  Postsurgical changes and volume loss in the LEFT upper lobe secondary to lung cancer treatment.  Mild elevation of LEFT diaphragm unchanged.  Slight fullness in LEFT perihilar region is stable.  Scarring and RIGHT upper lobe from prior resection.  No definite acute infiltrate, pleural effusion or pneumothorax.  Bones demineralized.  IMPRESSION: Postsurgical changes in both lungs greatest in LEFT upper lobe.  No acute abnormalities.   Electronically Signed   By: Lavonia Dana M.D.   On: 05/30/2015 17:17   Ct Angio Chest Pe W/cm &/or Wo Cm  05/30/2015   CLINICAL DATA:  Chest pain during stress test today. Initial encounter.  EXAM: CT ANGIOGRAPHY CHEST WITH CONTRAST  TECHNIQUE: Multidetector CT imaging of the chest was performed using the standard protocol during bolus administration of intravenous contrast. Multiplanar CT image reconstructions and MIPs were obtained to evaluate the vascular anatomy.  CONTRAST:  132m OMNIPAQUE IOHEXOL 350 MG/ML SOLN  COMPARISON:  Chest radiograph 05/30/2015.  CT chest 01/09/2015.  FINDINGS: Bones: No aggressive osseous lesions. Schmorl's nodes are present in the thoracic spine.  Cardiovascular: Technically adequate study without pulmonary embolism. Coronary artery atherosclerosis is present. If office based assessment of coronary risk factors has not been performed, it is now recommended. The heart is enlarged. Aorta grossly appears within normal limits. Pulmonary arterial hypertension is present with enlargement of LEFT and RIGHT pulmonary arteries.  Lungs: Postsurgical changes in the upper lobes that are chronic. Scarring is greater at the LEFT apex when compared to the RIGHT. Mild respiratory motion artifact. No pulmonary mass lesions, nodules, or airspace disease. Nodular scarring is unchanged in the posterior LEFT lower lobe  (image 43 series 6).  Central airways: Patent.  Effusions: Small RIGHT pleural effusion. Small to moderate pericardial effusion is present. The fusion measures about 18 mm along the RIGHT ventricle and fills the superior pericardial recesses. Pericardial effusion is chronic.  Lymphadenopathy: None.  Esophagus: Normal.  Upper abdomen: Probable LEFT upper pole renal collecting system calculus measuring 4 mm.  Other: None.  Review of the MIP images confirms the above findings.  IMPRESSION: 1. Negative for pulmonary embolus or acute aortic abnormality. 2. Cardiomegaly and small to moderate chronic pericardial effusion. Tiny RIGHT pleural effusion. 3. Postsurgical scarring in both upper lobes, greater on the LEFT when compared to the RIGHT. No change from chest CT 01/09/2015.   Electronically Signed   By: GDereck LigasM.D.   On: 05/30/2015 19:58     EKG Interpretation None      MDM   Final diagnoses:  Chest pain    Patient presented to the ER for evaluation of chest pain. Patient was experiencing fairly atypical chest pain at arrival to the ER. She has been experiencing intermittent episodes of pain in her chest that have been ongoing since March. Episodes are short-lived, do not appear to be exertional in nature. She did have associated shortness of breath. Cardiac evaluation did not show any evidence of ischemia, infarct or acute coronary syndrome. D-dimer was mildly elevated. CT angiography performed. Patient does have bilateral small pleural effusions and a chronic pericardial effusion which was seen on CT scan in March. It is not clear if her intermittent chest pain, positional shortness of breath is related to the pleural effusions and pericardial effusion. This will need to be further evaluated. This was discussed with family practice, they will see the patient for admission.     COrpah Greek MD 05/30/15 [phone]

## 2015-05-30 NOTE — ED Notes (Signed)
Back From CT 

## 2015-05-30 NOTE — Addendum Note (Signed)
Addended by: Newt Minion on: 05/30/2015 04:56 PM   Modules accepted: Level of Service

## 2015-05-31 ENCOUNTER — Inpatient Hospital Stay (HOSPITAL_COMMUNITY): Payer: Medicare Other

## 2015-05-31 ENCOUNTER — Encounter (HOSPITAL_COMMUNITY): Payer: Self-pay | Admitting: *Deleted

## 2015-05-31 DIAGNOSIS — R079 Chest pain, unspecified: Secondary | ICD-10-CM

## 2015-05-31 DIAGNOSIS — I3139 Other pericardial effusion (noninflammatory): Secondary | ICD-10-CM | POA: Insufficient documentation

## 2015-05-31 DIAGNOSIS — I319 Disease of pericardium, unspecified: Secondary | ICD-10-CM | POA: Diagnosis not present

## 2015-05-31 DIAGNOSIS — I313 Pericardial effusion (noninflammatory): Secondary | ICD-10-CM | POA: Insufficient documentation

## 2015-05-31 LAB — CBC
HCT: 35 % — ABNORMAL LOW (ref 36.0–46.0)
HCT: 36 % (ref 36.0–46.0)
Hemoglobin: 11.7 g/dL — ABNORMAL LOW (ref 12.0–15.0)
Hemoglobin: 12 g/dL (ref 12.0–15.0)
MCH: 31 pg (ref 26.0–34.0)
MCH: 30.9 pg (ref 26.0–34.0)
MCHC: 33.4 g/dL (ref 30.0–36.0)
MCHC: 33.3 g/dL (ref 30.0–36.0)
MCV: 92.6 fL (ref 78.0–100.0)
MCV: 92.8 fL (ref 78.0–100.0)
Platelets: 276 10*3/uL (ref 150–400)
Platelets: 283 10*3/uL (ref 150–400)
RBC: 3.78 MIL/uL — ABNORMAL LOW (ref 3.87–5.11)
RBC: 3.88 MIL/uL (ref 3.87–5.11)
RDW: 12.1 % (ref 11.5–15.5)
RDW: 12.2 % (ref 11.5–15.5)
WBC: 5.9 10*3/uL (ref 4.0–10.5)
WBC: 5.3 10*3/uL (ref 4.0–10.5)

## 2015-05-31 LAB — CBG MONITORING, ED: Glucose-Capillary: 263 mg/dL — ABNORMAL HIGH (ref 65–99)

## 2015-05-31 LAB — BASIC METABOLIC PANEL
Anion gap: 6 (ref 5–15)
BUN: 14 mg/dL (ref 6–20)
CO2: 30 mmol/L (ref 22–32)
Calcium: 9.2 mg/dL (ref 8.9–10.3)
Chloride: 104 mmol/L (ref 101–111)
Creatinine, Ser: 0.79 mg/dL (ref 0.44–1.00)
GFR calc Af Amer: >60 mL/min (ref >60)
GFR calc non Af Amer: >60 mL/min (ref >60)
Glucose, Bld: 153 mg/dL — ABNORMAL HIGH (ref 65–99)
Potassium: 4.1 mmol/L (ref 3.5–5.1)
Sodium: 140 mmol/L (ref 135–145)

## 2015-05-31 LAB — CREATININE, SERUM
Creatinine, Ser: 0.82 mg/dL (ref 0.44–1.00)
GFR calc Af Amer: >60 mL/min (ref >60)
GFR calc non Af Amer: >60 mL/min (ref >60)

## 2015-05-31 LAB — GLUCOSE, CAPILLARY
Glucose-Capillary: 89 mg/dL (ref 65–99)
Glucose-Capillary: 152 mg/dL — ABNORMAL HIGH (ref 65–99)
Glucose-Capillary: 159 mg/dL — ABNORMAL HIGH (ref 65–99)
Glucose-Capillary: 239 mg/dL — ABNORMAL HIGH (ref 65–99)

## 2015-05-31 LAB — TROPONIN I: Troponin I: <0.03 ng/mL (ref <0.031)

## 2015-05-31 MED ORDER — ALBUTEROL SULFATE (2.5 MG/3ML) 0.083% IN NEBU: 3.0000 mL | INHALATION_SOLUTION | Freq: Four times a day (QID) | RESPIRATORY_TRACT | Status: DC | PRN

## 2015-05-31 MED ORDER — SODIUM CHLORIDE 0.9 % IJ SOLN: 3.0000 mL | INTRAMUSCULAR | Status: DC | PRN

## 2015-05-31 MED ORDER — INSULIN ASPART 100 UNIT/ML ~~LOC~~ SOLN
0.0000 [IU] | Freq: Three times a day (TID) | SUBCUTANEOUS | Status: DC
  Administered 2015-05-31 – 2015-06-01 (×4): 2 [IU] via SUBCUTANEOUS

## 2015-05-31 MED ORDER — SODIUM CHLORIDE 0.9 % IJ SOLN
3.0000 mL | Freq: Two times a day (BID) | INTRAMUSCULAR | Status: DC
  Administered 2015-05-31 – 2015-06-01 (×4): 3 mL via INTRAVENOUS

## 2015-05-31 MED ORDER — SODIUM CHLORIDE 0.9 % IJ SOLN: 3.0000 mL | Freq: Two times a day (BID) | INTRAMUSCULAR | Status: DC

## 2015-05-31 MED ORDER — SODIUM CHLORIDE 0.9 % IV SOLN: 250.0000 mL | INTRAVENOUS | Status: DC | PRN

## 2015-05-31 MED ORDER — HEPARIN SODIUM (PORCINE) 5000 UNIT/ML IJ SOLN
5000.0000 [IU] | Freq: Three times a day (TID) | INTRAMUSCULAR | Status: DC
  Administered 2015-05-31 – 2015-06-01 (×4): 5000 [IU] via SUBCUTANEOUS
  Filled 2015-05-31 (×6): qty 1

## 2015-05-31 NOTE — Progress Notes (Signed)
This admission has been reviewed and determined not to meet inpatient level of care. Both attending Physician and Medical Director are in agreement this should be an Observation encounter according to the Medicare Conditions of Participation as set forth in CFR 42 Chapter 456 482.12 (c) and the Medicare Condition Code-44 Regulations CFR 42 Chapter 100 - 04 50.3. The Patient and/or Patient Representative was notified via delivery of the "MEDICARE OBSERVATION STATUS NOTIFICATION".              

## 2015-05-31 NOTE — Discharge Summary (Signed)
[facility] Discharge Summary  Patient name: [name] Medical record number: [id number] Date of birth: 25-Jul-1956 Age: 59 y.o. Gender: female Date of Admission: 05/30/2015  Date of Discharge: 06/01/2015  Admitting Physician: Dickie La, MD  Primary Care Provider: Jenny Reichmann, MD Consultants: Cardiology  Indication for Hospitalization:  Chest pain  Discharge Diagnoses/Problem List:  Patient Active Problem List   Diagnosis Date Noted  . Pericardial effusion   . Chest pain 05/30/2015  . 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 Open angle with borderline findings and low glaucoma risk in both eyes 05/23/2015  . Restrictive lung disease 03/14/2015  . 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 Insomnia   . Obesity   . Anemia   . Kidney stones   . AORTIC INSUFFICIENCY 09/05/2010  . UNDIAGNOSED CARDIAC MURMURS 02/24/2009  . Lung cancer 09/20/2008  . DYSPNEA 09/20/2008  . DIABETES MELLITUS, TYPE II 06/19/2007     Disposition: Home  Discharge Condition: Stable  Discharge Exam:   Temp: [97.5 F (36.4 C)-98.1 F (36.7 C)] 98.1 F (36.7 C) (08/11 0538) Pulse Rate: [84-95] 84 (08/11 0538) Resp: [18] 18 (08/11 0538) BP: (124-137)/(50-80) 124/50 mmHg (08/11 0538) SpO2: [99 %-100 %] 100 % (08/11 0538) Physical Exam: General: NAD HEENT: EOMI, MMM Cardiovascular: RRR, chronic 2/6 systolic ejection murmur Respiratory: CTAB, normal WOB Abdomen: soft, mildy tender at the epigastrum, no rebound or gaurding, + BS Extremities: 2+ pedal pulses, no LE edema Skin: no rashes noted Neuro: AOx4, no focal deficits  Brief Hospital Course:  [name] is a 59 y.o. female presented with chest pain. 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 Given concern the nature of her atypical chest pain with pleuritic features, she had a a ct chest which ruled out PE, but showed mild increased size of her pericardial effusion. Additionally there was concern for pulmonary artery hypertension on CT so an echo was obtained which was not significant for pulmonary artery hypertension. It did show grade 1 diastolic dysfunction but normal ejection fraction of 55-60%.  Given the positional nature of her chest pain, there was concern she may have pericarditis as a cause of her increased pericardial effusion. Cardiology was called to evaluate her chest pain and consider her pain likely secondary to pericarditis with an element of costochondritis. After confirmation that she did not have ACS, she was started on a two week course of motrin and colchicine for pericarditis and discharged to follow up with her PCP   Issues for Follow Up:  1. Resolution of chest pain with treatment for pericarditis  Significant Procedures:  CTPE Echocardiogram  Significant Labs and Imaging:   Recent Labs Lab 05/30/15 1635 05/31/15 0312 05/31/15 0812  WBC 6.1 5.9 5.3  HGB 12.6 11.7* 12.0  HCT 37.6 35.0* 36.0  PLT 273 276 283    Recent Labs Lab 05/30/15 1635 05/31/15 0312 05/31/15 0812  NA 138  --  140  K 3.9  --  4.1  CL 104  --  104  CO2 25  --  30  GLUCOSE 191*  --  153*  BUN 16  --  14  CREATININE 0.98 0.82 0.79  CALCIUM 9.2  --  9.2  ALKPHOS 112  --   --   AST 23  --   --   ALT 15  --   --   ALBUMIN 3.5  --   --     Results/Tests Pending at Time of Discharge:  None  Discharge Medications:    Medication List    TAKE these medications        albuterol 108 (90 BASE) MCG/ACT inhaler  Commonly known as:  PROVENTIL HFA;VENTOLIN HFA  Every 12 hours  as needed     aspirin 81 MG EC tablet  Take 1 tablet (81 mg total) by mouth daily.     colchicine 0.6 MG tablet  Take 1 tablet (0.6 mg total) by mouth 2 (two) times daily.     HYDROmorphone 2 MG tablet  Commonly known as:  DILAUDID  Take 2 mg by mouth every 8 (eight) hours as needed. for pain     ibuprofen 600 MG tablet  Commonly known as:  ADVIL,MOTRIN  Take 1 tablet (600 mg total) by mouth 3 (three) times daily.     ketotifen 0.025 % ophthalmic solution  Commonly known as:  ZADITOR  Place 1 drop into both eyes 2 (two) times daily.     meloxicam 15 MG tablet  Commonly known as:  MOBIC  Take 15 mg by mouth daily.     NOVOLOG MIX 70/30 (70-30) 100 UNIT/ML injection  Generic drug:  insulin aspart protamine- aspart  35 units in the morning and 25 units at night        Discharge Instructions: Please refer to Patient Instructions section of EMR for full details.  Patient was counseled important signs and symptoms that should prompt return to medical care, changes in medications, dietary instructions, activity restrictions, and follow up appointments.   Follow-Up Appointments: Follow-up Information    Schedule an appointment as soon as possible for a visit to follow up.      Veatrice Bourbon, MD 06/01/2015, 11:13 AM PGY-2, [place]

## 2015-05-31 NOTE — Progress Notes (Signed)
  Echocardiogram 2D Echocardiogram has been performed.  [name] 05/31/2015, 9:25 AM

## 2015-05-31 NOTE — ED Notes (Signed)
Report attempted 

## 2015-05-31 NOTE — ED Notes (Signed)
Patient states she took 24 units of Novolog around 2130.

## 2015-05-31 NOTE — Progress Notes (Signed)
Utilization review completed.  

## 2015-05-31 NOTE — ED Notes (Signed)
Report called to [facility], Therapist, sports.

## 2015-05-31 NOTE — Progress Notes (Signed)
Family Medicine Teaching Service Daily Progress Note Intern Pager: [phone]  Patient name: [name] Medical record number: [id number] Date of birth: 10-20-1956 Age: 59 y.o. Gender: female  Primary Care Provider: Jenny Reichmann, MD Consultants: Cardiology Code Status: Full  Pt Overview and Major Events to Date:  8/9; Admitted for chest pain/SOB  Assessment and Plan:  [name] is a 59 y.o. female presenting with chest pain. 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, history of pericardial effusion ( noted as far back as 2012), T2DM, COPD, h/o tension Pneumotorax, aortic insufficiency,   Chest pain, worsening pericardial effusion- Mildly improved pain - Cardiology consult for chest pain in setting of pericardial effusion - troponin neg x2 - f/u EKG his AM 8/10 - Echo 8/10 - if all negative consider treatment for pericarditis  T2DM- Last A1c 7.5 03/2015 - SSI - CBG ACHS  COPD- Stable, no wheezing, with normal oxygenation without supplemental O2 - albuterol PRN - Supplemental O2 PRN  FEN/GI: Heart/Carb modified diet, KVO Prophylaxis: heparin DVT ppx  Disposition: Pending clinical improvement  Subjective:  Improving chest pain but still noticeable when leaning forward, occasional shortness of breath associated, able to ambulate to restroom without marked SOB or chest pain  Objective: Temp:  [97.9 F (36.6 C)-98.5 F (36.9 C)] 98.2 F (36.8 C) (08/10 0232) Pulse Rate:  [67-105] 73 (08/10 0232) Resp:  [16-25] 18 (08/10 0232) BP: (127-161)/(49-98) 157/59 mmHg (08/10 0232) SpO2:  [94 %-100 %] 100 % (08/10 0232) Weight:  [246 lb 11.2 oz (111.902 kg)] 246 lb 11.2 oz (111.902 kg) (08/10 0232) Physical Exam: General:  NAD HEENT: EOMI, MMM Cardiovascular: RRR, chronic 2/6 systolic ejection murmur Respiratory: CTAB, normal WOB Abdomen: soft, mildy tender at the epigastrum, no rebound or  gaurding, + BS Extremities: 2+ pedal pulses, no LE edema Skin: no rashes noted Neuro: AOx4, no focal deficits  Laboratory:  Recent Labs Lab 05/30/15 1635 05/31/15 0312  WBC 6.1 5.9  HGB 12.6 11.7*  HCT 37.6 35.0*  PLT 273 276    Recent Labs Lab 05/30/15 1635 05/31/15 0312  NA 138  --   K 3.9  --   CL 104  --   CO2 25  --   BUN 16  --   CREATININE 0.98 0.82  CALCIUM 9.2  --   PROT 6.8  --   BILITOT 0.5  --   ALKPHOS 112  --   ALT 15  --   AST 23  --   GLUCOSE 191*  --     Imaging/Diagnostic Tests:  CT chest 8/9 IMPRESSION: 1. Negative for pulmonary embolus or acute aortic abnormality. 2. Cardiomegaly and small to moderate chronic pericardial effusion. Tiny RIGHT pleural effusion. 3. Postsurgical scarring in both upper lobes, greater on the LEFT when compared to the RIGHT. No change from chest CT 01/09/2015.  CXR 8/9 IMPRESSION: Postsurgical changes in both lungs greatest in LEFT upper lobe.  Veatrice Bourbon, MD 05/31/2015, 7:03 AM PGY-2, [place] Intern pager: [phone], text pages welcome

## 2015-06-01 ENCOUNTER — Encounter (HOSPITAL_COMMUNITY): Payer: Medicare Other

## 2015-06-01 DIAGNOSIS — I5032 Chronic diastolic (congestive) heart failure: Secondary | ICD-10-CM

## 2015-06-01 DIAGNOSIS — I351 Nonrheumatic aortic (valve) insufficiency: Secondary | ICD-10-CM

## 2015-06-01 DIAGNOSIS — R079 Chest pain, unspecified: Secondary | ICD-10-CM | POA: Diagnosis not present

## 2015-06-01 DIAGNOSIS — I319 Disease of pericardium, unspecified: Secondary | ICD-10-CM | POA: Diagnosis not present

## 2015-06-01 DIAGNOSIS — I309 Acute pericarditis, unspecified: Secondary | ICD-10-CM

## 2015-06-01 LAB — GLUCOSE, CAPILLARY
Glucose-Capillary: 152 mg/dL — ABNORMAL HIGH (ref 65–99)
Glucose-Capillary: 180 mg/dL — ABNORMAL HIGH (ref 65–99)

## 2015-06-01 MED ORDER — IBUPROFEN 600 MG PO TABS
600.0000 mg | ORAL_TABLET | Freq: Three times a day (TID) | ORAL | Status: DC
  Administered 2015-06-01: 600 mg via ORAL
  Filled 2015-06-01 (×3): qty 1

## 2015-06-01 MED ORDER — IBUPROFEN 600 MG PO TABS: 600.0000 mg | ORAL_TABLET | Freq: Three times a day (TID) | ORAL | Status: AC

## 2015-06-01 MED ORDER — ASPIRIN 81 MG PO TBEC: 81.0000 mg | DELAYED_RELEASE_TABLET | Freq: Every day | ORAL | Status: DC

## 2015-06-01 MED ORDER — COLCHICINE 0.6 MG PO TABS: 0.6000 mg | ORAL_TABLET | Freq: Two times a day (BID) | ORAL | Status: DC

## 2015-06-01 MED ORDER — COLCHICINE 0.6 MG PO TABS
0.6000 mg | ORAL_TABLET | Freq: Two times a day (BID) | ORAL | Status: DC
  Administered 2015-06-01: 0.6 mg via ORAL
  Filled 2015-06-01 (×2): qty 1

## 2015-06-01 NOTE — Progress Notes (Signed)
Pt discharged home with family  Discharge instructions given & reviewed Education discussed, teachback utilized.  IV dc'd  Tele dc'd  Pt discharged via wheelchair with NT All patient belongings at side.   Sherrie Mustache

## 2015-06-01 NOTE — Progress Notes (Signed)
Family Medicine Teaching Service Daily Progress Note Intern Pager: [phone]  Patient name: [name] Medical record number: [id number] Date of birth: 1956/02/27 Age: 59 y.o. Gender: female  Primary Care Provider: Jenny Reichmann, MD Consultants: Cardiology Code Status: Full  Pt Overview and Major Events to Date:  8/9; Admitted for chest pain/SOB  Assessment and Plan:  [name] is a 59 y.o. female presenting with chest pain. 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, history of pericardial effusion ( noted as far back as 2012), T2DM, COPD, h/o tension Pneumotorax, aortic insufficiency,   Chest pain, worsening pericardial effusion- Improving pain - Cardiology consult for chest pain in setting of pericardial effusion - troponin neg x2 - s/p echo showing grade 1 diastolic dysfunction, normal EF - cocner for pericarditis, but will await cards input prior to starting NSAIDs for periarditis  T2DM- Last A1c 7.5 03/2015- stable - SSI - CBG ACHS  COPD- Stable, no wheezing, with normal oxygenation without supplemental O2 - albuterol PRN - Supplemental O2 PRN  FEN/GI: Heart/Carb modified diet, KVO Prophylaxis: heparin DVT ppx  Disposition: Pending clinical improvement  Subjective:  Improving chest pain but still noticed when leaning forward. Shortness of breath at baseline  Objective: Temp:  [97.5 F (36.4 C)-98.1 F (36.7 C)] 98.1 F (36.7 C) (08/11 0538) Pulse Rate:  [84-95] 84 (08/11 0538) Resp:  [18] 18 (08/11 0538) BP: (124-137)/(50-80) 124/50 mmHg (08/11 0538) SpO2:  [99 %-100 %] 100 % (08/11 0538) Physical Exam: General:  NAD HEENT: EOMI, MMM Cardiovascular: RRR, chronic 2/6 systolic ejection murmur Respiratory: CTAB, normal WOB Abdomen: soft, mildy tender at the epigastrum, no rebound or gaurding, + BS Extremities: 2+ pedal pulses, no LE edema Skin: no rashes noted Neuro: AOx4, no  focal deficits  Laboratory:  Recent Labs Lab 05/30/15 1635 05/31/15 0312 05/31/15 0812  WBC 6.1 5.9 5.3  HGB 12.6 11.7* 12.0  HCT 37.6 35.0* 36.0  PLT 273 276 283    Recent Labs Lab 05/30/15 1635 05/31/15 0312 05/31/15 0812  NA 138  --  140  K 3.9  --  4.1  CL 104  --  104  CO2 25  --  30  BUN 16  --  14  CREATININE 0.98 0.82 0.79  CALCIUM 9.2  --  9.2  PROT 6.8  --   --   BILITOT 0.5  --   --   ALKPHOS 112  --   --   ALT 15  --   --   AST 23  --   --   GLUCOSE 191*  --  153*    Imaging/Diagnostic Tests:  CT chest 8/9 IMPRESSION: 1. Negative for pulmonary embolus or acute aortic abnormality. 2. Cardiomegaly and small to moderate chronic pericardial effusion. Tiny RIGHT pleural effusion. 3. Postsurgical scarring in both upper lobes, greater on the LEFT when compared to the RIGHT. No change from chest CT 01/09/2015.  CXR 8/9 IMPRESSION: Postsurgical changes in both lungs greatest in LEFT upper lobe.  Veatrice Bourbon, MD 06/01/2015, 8:16 AM PGY-2, [place] Intern pager: [phone], text pages welcome

## 2015-06-01 NOTE — Discharge Instructions (Signed)
Please take motrin three times daily and colchicine two times daily for Pericarditis  Please make a follow up appointment with your PCP Dr Everlene Farrier as soon as possible.  You should also make an appointment to follow-up with Dr. Harrington Challenger (Cardiology) in 1-2 weeks.  Follow-up Information    Schedule an appointment as soon as possible for a visit to follow up.       Pericarditis Pericarditis is swelling (inflammation) of the pericardium. The pericardium is a thin, double-layered, fluid-filled tissue sac that surrounds the heart. The purpose of the pericardium is to contain the heart in the chest cavity and keep the heart from overexpanding. Different types of pericarditis can occur, such as:  Acute pericarditis. Inflammation can develop suddenly in acute pericarditis.  Chronic pericarditis. Inflammation develops gradually and is long-lasting in chronic pericarditis.  Constrictive pericarditis. In this type of pericarditis, the layers of the pericardium stiffen and develop scar tissue. The scar tissue thickens and sticks together. This makes it difficult for the heart to pump and work as it normally does. CAUSES  Pericarditis can be caused from different conditions, such as:  A bacterial, fungal or viral infection.  After a heart attack (myocardial infarction).  After open-heart surgery (coronary bypass graft surgery).  Auto-immune conditions such as lupus, rheumatoid arthritis or scleroderma.  Kidney failure.  Low thyroid condition (hypothyroidism).  Cancer from another part of the body that has spread (metastasized) to the pericardium.  Chest injury or trauma.  After radiation treatment.  Certain medicines. SYMPTOMS  Symptoms of pericarditis can include:  Chest pain. Chest pain symptoms may increase when laying down and may be relieved when sitting up and leaning forward.  A chronic, dry cough.  Heart palpitations. These may feel like rapid, fluttering or pounding heart  beats.  Chest pain may be worse when swallowing.  Dizziness or fainting.  Tiredness, fatigue or lethargy.  Fever. DIAGNOSIS  Pericarditis is diagnosed by the following:  A physical exam. A heart sound called a pericardial friction rub may be heard when your caregiver listens to your heart.  Blood work. Blood may be drawn to check for an infection and to look at your blood chemistry.  Electrocardiography. During electrocardiography your heart's electrical activity is monitored and recorded with a tracing on paper (electrocardiogram [ECG]).  Echocardiography.  Computed tomography (CT).  Magnetic resonance image (MRI). TREATMENT  To treat pericarditis, it is important to know the cause of it. The cause of pericarditis determines the treatment.   If the cause of pericarditis is due to an infection, treatment is based on the type of infection. If an infection is suspected in the pericardial fluid, a procedure called a pericardial fluid culture and biopsy may be done. This takes a sample of the pericardial fluid. The sample is sent to a lab which runs tests on the pericardial fluid to check for an infection.  If the autoimmune disease is the cause, treatment of the autoimmune condition will help improve the pericarditis.  If the cause of pericarditis is not known, anti-inflammatory medicines may be used to help decrease the inflammation.  Surgery may be needed. The following are types of surgeries or procedures that may be done to treat pericarditis:  Pericardial window. A pericardial window makes a cut (incision) into the pericardial sac. This allows excess fluid in the pericardium to drain.  Pericardiocentesis. A pericardiocentesis is also known as a pericardial tap. This procedure uses a needle that is guided by X-ray to drain (aspirate) excess fluid from  the pericardium.  Pericardiectomy. A pericardiectomy removes part or all of the pericardium. HOME CARE INSTRUCTIONS   Do not  smoke. If you smoke, quit. Your caregiver can help you quit smoking.  Maintain a healthy weight.  Follow an exercise program as told by your caregiver.  If you drink alcohol, do so in moderation.  Eat a heart healthy diet. A registered dietician can help you learn about healthy food choices.  Keep a list of all your medicines with you at all times. Include the name, dose, how often it is taken and how it is taken. SEEK IMMEDIATE MEDICAL CARE IF:   You have chest pain or feelings of chest pressure.  You have sweating (diaphoresis) when at rest.  You have irregular heartbeats (palpitations).  You have rapid, racing heart beats.  You have unexplained fainting episodes.  You feel sick to your stomach (nausea) or vomiting without cause.  You have unexplained weakness. If you develop any of the symptoms which originally made you seek care, call for local emergency medical help. Do not drive yourself to the hospital. Document Released: 04/02/2001 Document Revised: 12/30/2011 Document Reviewed: 10/09/2011 [facility] Patient Information 2015 [place], [place]. This information is not intended to replace advice given to you by your health care provider. Make sure you discuss any questions you have with your health care provider.

## 2015-06-01 NOTE — Consult Note (Signed)
CONSULT NOTE  Date: 06/01/2015               Patient Name:  [name] MRN: [id number]  DOB: Dec 07, 1955 Age / Sex: 59 y.o., female        PCP: Nena Jordan A Primary Cardiologist: Harrington Challenger             Referring Physician: Nori Riis               Reason for Consult:  chest pain , possible pericarditis            History of Present Illness: Patient is a 59 y.o. female with a PMHx of stage IIIB  nonsmall cell lung cancer, status post radiation therapy, pulmonary fibrosis aortic insufficiency, diabetes mellitus, who was admitted to [facility] on 05/30/2015 for evaluation of chest discomfort..   She had a CT angiogram of the chest yesterday which was negative for pulmonary emboli. Did show enlargement of the right and left pulmonary arteries-consistent with pulmonary hypertension.    I have personally reviewed the echocardiogram and it show normal left ventricle systolic function with evidence of diastolic dysfunction. She has moderate aortic insufficiency, trivial tricuspid regurgitation, mild- moderate pulmonic insufficiency.  The echo was technically difficult but did show very small pericardial effusion without any evidence of cardiac  tamponade.   There is no evidence of pulmonary hypertension by echo. RV/ RA gradient was 21 mmHg.   Medications: Outpatient medications: Prescriptions prior to admission  Medication Sig Dispense Refill Last Dose  . albuterol (PROVENTIL HFA;VENTOLIN HFA) 108 (90 BASE) MCG/ACT inhaler Every 12 hours as needed 1 Inhaler 6 PRN  . HYDROmorphone (DILAUDID) 2 MG tablet Take 2 mg by mouth every 8 (eight) hours as needed. for pain  0 2 weeks  . insulin aspart protamine- aspart (NOVOLOG MIX 70/30) (70-30) 100 UNIT/ML injection 35 units in the morning and 25 units at night   05/30/2015 at Unknown time  . ketotifen (ZADITOR) 0.025 % ophthalmic solution Place 1 drop into both eyes 2 (two) times daily. 5 mL 0 05/29/2015 at Unknown time  . meloxicam (MOBIC) 15 MG tablet Take 15  mg by mouth daily.   05/30/2015 at Unknown time    Current medications: Current Facility-Administered Medications  Medication Dose Route Frequency Provider Last Rate Last Dose  . 0.9 %  sodium chloride infusion  250 mL Intravenous PRN Alyssa A Haney, MD      . albuterol (PROVENTIL) (2.5 MG/3ML) 0.083% nebulizer solution 3 mL  3 mL Inhalation Q6H PRN Veatrice Bourbon, MD      . aspirin EC tablet 81 mg  81 mg Oral Daily Alyssa A Haney, MD   81 mg at 05/31/15 0932  . heparin injection 5,000 Units  5,000 Units Subcutaneous 3 times per day Veatrice Bourbon, MD   5,000 Units at 06/01/15 0705  . HYDROmorphone (DILAUDID) tablet 2 mg  2 mg Oral Q4H PRN Veatrice Bourbon, MD   2 mg at 05/31/15 2040  . insulin aspart (novoLOG) injection 0-9 Units  0-9 Units Subcutaneous TID WC Veatrice Bourbon, MD   2 Units at 06/01/15 0705  . sodium chloride 0.9 % injection 3 mL  3 mL Intravenous Q12H Veatrice Bourbon, MD   3 mL at 05/31/15 0401  . sodium chloride 0.9 % injection 3 mL  3 mL Intravenous Q12H Veatrice Bourbon, MD   3 mL at 05/31/15 2204  . sodium chloride 0.9 % injection 3  mL  3 mL Intravenous PRN Veatrice Bourbon, MD         Allergies  Allergen Reactions  . Hydrocodone Hives and Itching  . Morphine And Related Other (See Comments)    GI PROBLEMS  . Nsaids Other (See Comments)    GI PROBLEMS     Past Medical History  Diagnosis Date  . Insomnia   . Nephrolithiasis   . Obesity   . Erythema nodosum   . Aortic insufficiency   . Allergy   . Anemia   . COPD (chronic obstructive pulmonary disease)   . Heart murmur   . Myalgia   . Kidney stones   . Aortic regurgitation   . Pulmonary fibrosis 01/20/2012    Due to previous surgery and chest radiation for lung cancer  . Pneumothorax, spontaneous, tension 01/20/2012    October, 2010  . lung ca dx'd 10/1999    chemo/xrt comp 05/29/2000  . 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 Diabetes mellitus   . Shortness of breath dyspnea     Past Surgical History  Procedure Laterality Date  . Chest surgery      bilateral lung  . Shoulder surgery      right  . Inner ear surgery      right   . Abdominal hysterectomy    . Colonoscopy    . Upper gastrointestinal endoscopy    . Total knee arthroplasty      RIGHT  . Carpal tunnel release      RIGHT in 1998/Left hand in 2009  . Pulmonary embolism surgery  2007    LUNG COLLAPSE  . Esophageal dilation    . Knee operation      LEFT LATERAL RELEASE  . Foot surgery      Callous removed /Bil  . Appendectomy      Family History  Problem Relation Age of Onset  . Heart disease Father   . Diabetes Father   . Pancreatic cancer Mother   . Diabetes Mother   . Hypertension Sister     x 3  . Diabetes Sister     x 4  . Diabetes Maternal Grandmother   . Diabetes Paternal Grandmother     Social History:  reports that she quit smoking about 15 years ago. She has never used smokeless tobacco. She reports that she does not drink alcohol or use illicit drugs.   Review of Systems: Constitutional:  denies fever, chills, diaphoresis, appetite change and fatigue.  HEENT: denies photophobia, eye pain, redness, hearing loss, ear pain, congestion, sore throat, rhinorrhea, sneezing, neck pain, neck stiffness and tinnitus.  Respiratory: admits to SOB, DOE, cough, chest tightness, and wheezing.  Cardiovascular: admits to chest wall pain ,  CP with deep breath or pulling forward  Gastrointestinal: denies nausea, vomiting, abdominal pain, diarrhea, constipation, blood in stool.  Genitourinary: denies dysuria, urgency, frequency, hematuria, flank pain and difficulty urinating.  Musculoskeletal: denies  myalgias, back pain, joint swelling, arthralgias and gait problem.   Skin: denies pallor, rash and wound.  Neurological: denies dizziness, seizures, syncope, weakness, light-headedness, numbness and  headaches.   Hematological: denies adenopathy, easy bruising, personal or family bleeding history.  Psychiatric/ Behavioral: denies suicidal ideation, mood changes, confusion, nervousness, sleep disturbance and agitation.    Physical Exam: BP 124/50 mmHg  Pulse 84  Temp(Src) 98.1 F (36.7 C) (Oral)  Resp 18  Ht 5' 7.5" (1.715 m)  Wt 111.902 kg (246 lb 11.2 oz)  BMI 38.05 kg/m2  SpO2 100%  Wt Readings from Last 3 Encounters:  05/31/15 111.902 kg (246 lb 11.2 oz)  03/14/15 110.678 kg (244 lb)  01/16/15 110.451 kg (243 lb 8 oz)    General: Vital signs reviewed and noted. Well-developed, well-nourished, in no acute distress; alert,   Head: Normocephalic, atraumatic, sclera anicteric,   Neck: Supple. Negative for carotid bruits. No JVD   Lungs:  Clear bilaterally, no  wheezes, rales, or rhonchi. Breathing is normal   Heart: RRR with S1 S2. 0-2/4 systolic murmur , 0-9/7 diastolic murmur , no rub   Abdomen/ GI :  Soft, non-tender, non-distended with normoactive bowel sounds. No hepatomegaly. No rebound/guarding. Obese   MSK: Strength and the appear normal for age.   Extremities: No clubbing or cyanosis. No edema.  Distal pedal pulses are 2+ and equal   Neurologic:  CN are grossly intact,  No obvious motor or sensory defect.  Alert and oriented X 3. Moves all extremities spontaneously.  Psych: Responds to questions appropriately with a normal affect.     Lab results: Basic Metabolic Panel:  Recent Labs Lab 05/30/15 1635 05/31/15 0312 05/31/15 0812  NA 138  --  140  K 3.9  --  4.1  CL 104  --  104  CO2 25  --  30  GLUCOSE 191*  --  153*  BUN 16  --  14  CREATININE 0.98 0.82 0.79  CALCIUM 9.2  --  9.2    Liver Function Tests:  Recent Labs Lab 05/30/15 1635  AST 23  ALT 15  ALKPHOS 112  BILITOT 0.5  PROT 6.8  ALBUMIN 3.5   No results for input(s): LIPASE, AMYLASE in the last 168 hours. No results for input(s): AMMONIA in the last 168 hours.  CBC:  Recent  Labs Lab 05/30/15 1635 05/31/15 0312 05/31/15 0812  WBC 6.1 5.9 5.3  NEUTROABS 3.6  --   --   HGB 12.6 11.7* 12.0  HCT 37.6 35.0* 36.0  MCV 92.6 92.6 92.8  PLT 273 276 283    Cardiac Enzymes:  Recent Labs Lab 05/30/15 1635 05/31/15 0312  TROPONINI <0.03 <0.03    BNP: Invalid input(s): POCBNP  CBG:  Recent Labs Lab 05/31/15 0615 05/31/15 1119 05/31/15 1640 05/31/15 2114 06/01/15 0612  GLUCAP 89 152* 159* 239* 152*    Coagulation Studies: No results for input(s): LABPROT, INR in the last 72 hours.   Other results: Personal review of EKG shows :  NSR , no acute changes. No specific changes to suggest pericarditis .  Imaging: Dg Chest 2 View  05/30/2015   CLINICAL DATA:  Chest pain for 1 day, history diabetes mellitus, lung cancer, COPD, pulmonary fibrosis  EXAM: CHEST  2 VIEW  COMPARISON:  01/13/2013  FINDINGS: Upper normal heart size.  Normal mediastinal contours and pulmonary vascularity.  Postsurgical changes and volume loss in the LEFT upper lobe secondary to lung cancer treatment.  Mild elevation of LEFT diaphragm unchanged.  Slight fullness in LEFT perihilar region is stable.  Scarring and RIGHT upper lobe from prior resection.  No definite acute infiltrate, pleural effusion or pneumothorax.  Bones demineralized.  IMPRESSION: Postsurgical changes in both lungs greatest in LEFT upper lobe.  No acute abnormalities.   Electronically Signed   By: Lavonia Dana M.D.   On: 05/30/2015 17:17   Ct Angio Chest Pe W/cm &/or Wo Cm  05/30/2015  CLINICAL DATA:  Chest pain during stress test today. Initial encounter.  EXAM: CT ANGIOGRAPHY CHEST WITH CONTRAST  TECHNIQUE: Multidetector CT imaging of the chest was performed using the standard protocol during bolus administration of intravenous contrast. Multiplanar CT image reconstructions and MIPs were obtained to evaluate the vascular anatomy.  CONTRAST:  128m OMNIPAQUE IOHEXOL 350 MG/ML SOLN  COMPARISON:  Chest radiograph  05/30/2015.  CT chest 01/09/2015.  FINDINGS: Bones: No aggressive osseous lesions. Schmorl's nodes are present in the thoracic spine.  Cardiovascular: Technically adequate study without pulmonary embolism. Coronary artery atherosclerosis is present. If office based assessment of coronary risk factors has not been performed, it is now recommended. The heart is enlarged. Aorta grossly appears within normal limits. Pulmonary arterial hypertension is present with enlargement of LEFT and RIGHT pulmonary arteries.  Lungs: Postsurgical changes in the upper lobes that are chronic. Scarring is greater at the LEFT apex when compared to the RIGHT. Mild respiratory motion artifact. No pulmonary mass lesions, nodules, or airspace disease. Nodular scarring is unchanged in the posterior LEFT lower lobe (image 43 series 6).  Central airways: Patent.  Effusions: Small RIGHT pleural effusion. Small to moderate pericardial effusion is present. The fusion measures about 18 mm along the RIGHT ventricle and fills the superior pericardial recesses. Pericardial effusion is chronic.  Lymphadenopathy: None.  Esophagus: Normal.  Upper abdomen: Probable LEFT upper pole renal collecting system calculus measuring 4 mm.  Other: None.  Review of the MIP images confirms the above findings.  IMPRESSION: 1. Negative for pulmonary embolus or acute aortic abnormality. 2. Cardiomegaly and small to moderate chronic pericardial effusion. Tiny RIGHT pleural effusion. 3. Postsurgical scarring in both upper lobes, greater on the LEFT when compared to the RIGHT. No change from chest CT 01/09/2015.   Electronically Signed   By: GDereck LigasM.D.   On: 05/30/2015 19:58        Assessment & Plan:  1. Pleuritic chest pain: The patient's symptoms are somewhat consistent with pericarditis. There is no evidence of pericarditis on her EKG. The echo card gram does reveal a small pericardial effusion but without any evidence of cardiac tamponade  She also  has significant chest wall tenderness and likely has costochondritis . Troponin levels are negative and there is no suggestion of ischemic heart disease .  I would try motrin and colchicine for several weeks and see if she improves.    2. Dyspnea:  She has multiple reasons for dyspnea - has a hx of pulmonary fibrosis ( from XRT) Further eval per pulmonary or int. Med. Has diastolic dysfunction. Continue meds.    3. Aortic insufficiency:  Unchanged from previous echos. Followed by Dr. RHarrington Challenger  Will sign off. Call for questions   PRamond Dial, MD, [facility]8/08/2015, 9:31 AM Office - [phone] [phone]

## 2015-06-05 ENCOUNTER — Ambulatory Visit (INDEPENDENT_AMBULATORY_CARE_PROVIDER_SITE_OTHER): Payer: Medicare Other | Admitting: Emergency Medicine

## 2015-06-05 VITALS — BP 148/80 | HR 63 | Temp 98.0°F | Resp 16 | Ht 68.0 in | Wt 244.6 lb

## 2015-06-05 DIAGNOSIS — I300 Acute nonspecific idiopathic pericarditis: Secondary | ICD-10-CM

## 2015-06-05 LAB — POCT CBC
Granulocyte percent: 64.2 %G (ref 37–80)
HCT, POC: 37.1 % — AB (ref 37.7–47.9)
Hemoglobin: 11.9 g/dL — AB (ref 12.2–16.2)
Lymph, poc: 1.5 (ref 0.6–3.4)
MCH, POC: 29.6 pg (ref 27–31.2)
MCHC: 32.1 g/dL (ref 31.8–35.4)
MCV: 92 fL (ref 80–97)
MID (cbc): 0.3 (ref 0–0.9)
MPV: 6.8 fL (ref 0–99.8)
POC Granulocyte: 3.2 (ref 2–6.9)
POC LYMPH PERCENT: 30.7 %L (ref 10–50)
POC MID %: 5.1 %M (ref 0–12)
Platelet Count, POC: 275 10*3/uL (ref 142–424)
RBC: 4.04 M/uL (ref 4.04–5.48)
RDW, POC: 12.9 %
WBC: 5 10*3/uL (ref 4.6–10.2)

## 2015-06-05 LAB — RHEUMATOID FACTOR: Rhuematoid fact SerPl-aCnc: <10 IU/mL (ref <=14)

## 2015-06-05 LAB — C-REACTIVE PROTEIN: CRP: 0.7 mg/dL — ABNORMAL HIGH (ref <0.60)

## 2015-06-05 LAB — POCT SEDIMENTATION RATE: POCT SED RATE: 41 mm/hr — AB (ref 0–22)

## 2015-06-05 NOTE — Progress Notes (Addendum)
Patient ID: [name], female   DOB: 1956-07-05, 59 y.o.   MRN: [id number]    This chart was scribed for Nena Jordan, MD by [facility], medical scribe at [facility].The patient was seen in exam room ** and the patient's care was started at 11:33 AM.  Chief Complaint:  Chief Complaint  Patient presents with  . Follow-up    chest pain, pressing  . OTHER    pulse ox was low , long nails    HPI: [name] is a 59 y.o. female with a history of lung cancer who reports to [facility] today for a follow up after a hospitalization for chest pain. Dr. Elsworth Soho is her pulmonologist. Since leaving the hospital she is still in pain, colchicine and mobic have not helped. Laying down flat and certain movements gives her chest pain and shortness of breath. Cannot walk a flight of stair without becoming short of breath. Seeing Dr. Harrington Challenger PA the [address], does not have an appointment scheduled with Dr. Elsworth Soho.   Past Medical History  Diagnosis Date  . Insomnia   . Nephrolithiasis   . Obesity   . Erythema nodosum   . Aortic insufficiency   . Allergy   . Anemia   . COPD (chronic obstructive pulmonary disease)   . Heart murmur   . Myalgia   . Kidney stones   . Aortic regurgitation   . Pulmonary fibrosis 01/20/2012    Due to previous surgery and chest radiation for lung cancer  . Pneumothorax, spontaneous, tension 01/20/2012    October, 2010  . lung ca dx'd 10/1999    chemo/xrt comp 05/29/2000  . 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 Diabetes mellitus   . Shortness of breath dyspnea    Past Surgical History  Procedure Laterality Date  . Chest surgery      bilateral lung  . Shoulder surgery      right  . Inner ear surgery      right   . Abdominal hysterectomy    . Colonoscopy    . Upper gastrointestinal endoscopy    . Total knee arthroplasty      RIGHT  . Carpal  tunnel release      RIGHT in 1998/Left hand in 2009  . Pulmonary embolism surgery  2007    LUNG COLLAPSE  . Esophageal dilation    . Knee operation      LEFT LATERAL RELEASE  . Foot surgery      Callous removed /Bil  . Appendectomy     Social History   Social History  . Marital Status: Single    Spouse Name: N/A  . Number of Children: 0  . Years of Education: N/A   Occupational History  . RETIRED    Social History Main Topics  . Smoking status: Former Smoker    Quit date: 07/22/1999  . Smokeless tobacco: Never Used  . Alcohol Use: No  . Drug Use: No  . Sexual Activity: Not Asked   Other Topics Concern  . None   Social History Narrative   Family History  Problem Relation Age of Onset  . Heart disease Father   . Diabetes Father   . Pancreatic cancer Mother   . Diabetes Mother   . Hypertension Sister     x 3  . Diabetes Sister  x 4  . Diabetes Maternal Grandmother   . Diabetes Paternal Grandmother    Allergies  Allergen Reactions  . Hydrocodone Hives and Itching  . Morphine And Related Other (See Comments)    GI PROBLEMS  . Nsaids Other (See Comments)    GI PROBLEMS   Prior to Admission medications   Medication Sig Start Date End Date Taking? Authorizing Provider  albuterol (PROVENTIL HFA;VENTOLIN HFA) 108 (90 BASE) MCG/ACT inhaler Every 12 hours as needed 03/14/15  Yes Rigoberto Noel, MD  aspirin EC 81 MG EC tablet Take 1 tablet (81 mg total) by mouth daily. 06/01/15  Yes Alyssa A Lincoln Brigham, MD  colchicine 0.6 MG tablet Take 1 tablet (0.6 mg total) by mouth 2 (two) times daily. 06/01/15 06/15/15 Yes Alyssa A Haney, MD  HYDROmorphone (DILAUDID) 2 MG tablet Take 2 mg by mouth every 8 (eight) hours as needed. for pain 05/02/15  Yes Historical Provider, MD  ibuprofen (ADVIL,MOTRIN) 600 MG tablet Take 1 tablet (600 mg total) by mouth 3 (three) times daily. 06/01/15 06/15/15 Yes Alyssa A Haney, MD  insulin aspart protamine- aspart (NOVOLOG MIX 70/30) (70-30) 100 UNIT/ML  injection 35 units in the morning and 25 units at night   Yes Historical Provider, MD  meloxicam (MOBIC) 15 MG tablet Take 15 mg by mouth daily. 05/30/15  Yes Historical Provider, MD  ketotifen (ZADITOR) 0.025 % ophthalmic solution Place 1 drop into both eyes 2 (two) times daily. Patient not taking: Reported on 06/05/2015 05/24/15   [name], MD   ROS: The patient denies fevers, chills, night sweats, unintentional weight loss, chest pain, palpitations, wheezing, dyspnea on exertion, nausea, vomiting, abdominal pain, dysuria, hematuria, melena, numbness, weakness, or tingling.   All other systems have been reviewed and were otherwise negative with the exception of those mentioned in the HPI and as above.    PHYSICAL EXAM: Filed Vitals:   06/05/15 1049  BP: 148/80  Pulse: 63  Temp: 98 F (36.7 C)  Resp: 16   Body mass index is 37.2 kg/(m^2).  General: Alert, no acute distress HEENT:  Normocephalic, atraumatic, oropharynx patent. Eye: [name] [facility] Cardiovascular:  Regular rate and rhythm, no rubs murmurs or gallops.  No Carotid bruits, radial pulse intact. No pedal edema.  Respiratory: Clear to auscultation bilaterally.  No wheezes, rales, or rhonchi.  No cyanosis, no use of accessory musculature Abdominal: No organomegaly, abdomen is soft and non-tender, positive bowel sounds.  No masses. Musculoskeletal: Gait intact. No edema, tenderness Skin: No rashes. Neurologic: Facial musculature symmetric. Psychiatric: Patient acts appropriately throughout our interaction. Lymphatic: No cervical or submandibular lymphadenopathy  LABS: Results for orders placed or performed during the hospital encounter of 05/30/15  CBC with Differential/Platelet  Result Value Ref Range   WBC 6.1 4.0 - 10.5 K/uL   RBC 4.06 3.87 - 5.11 MIL/uL   Hemoglobin 12.6 12.0 - 15.0 g/dL   HCT 37.6 36.0 - 46.0 %   MCV 92.6 78.0 - 100.0 fL   MCH 31.0 26.0 - 34.0 pg   MCHC 33.5 30.0 - 36.0 g/dL   RDW 12.1 11.5 -  15.5 %   Platelets 273 150 - 400 K/uL   Neutrophils Relative % 59 43 - 77 %   Neutro Abs 3.6 1.7 - 7.7 K/uL   Lymphocytes Relative 30 12 - 46 %   Lymphs Abs 1.8 0.7 - 4.0 K/uL   Monocytes Relative 6 3 - 12 %   Monocytes Absolute 0.4 0.1 - 1.0 K/uL  Eosinophils Relative 5 0 - 5 %   Eosinophils Absolute 0.3 0.0 - 0.7 K/uL   Basophils Relative 0 0 - 1 %   Basophils Absolute 0.0 0.0 - 0.1 K/uL  Comprehensive metabolic panel  Result Value Ref Range   Sodium 138 135 - 145 mmol/L   Potassium 3.9 3.5 - 5.1 mmol/L   Chloride 104 101 - 111 mmol/L   CO2 25 22 - 32 mmol/L   Glucose, Bld 191 (H) 65 - 99 mg/dL   BUN 16 6 - 20 mg/dL   Creatinine, Ser 0.98 0.44 - 1.00 mg/dL   Calcium 9.2 8.9 - 10.3 mg/dL   Total Protein 6.8 6.5 - 8.1 g/dL   Albumin 3.5 3.5 - 5.0 g/dL   AST 23 15 - 41 U/L   ALT 15 14 - 54 U/L   Alkaline Phosphatase 112 38 - 126 U/L   Total Bilirubin 0.5 0.3 - 1.2 mg/dL   GFR calc non Af Amer >60 >60 mL/min   GFR calc Af Amer >60 >60 mL/min   Anion gap 9 5 - 15  Troponin I  Result Value Ref Range   Troponin I <0.03 <0.031 ng/mL  Brain natriuretic peptide  Result Value Ref Range   B Natriuretic Peptide 35.0 0.0 - 100.0 pg/mL  D-dimer, quantitative (not at [facility])  Result Value Ref Range   D-Dimer, Quant 0.71 (H) 0.00 - 0.48 ug/mL-FEU  Troponin I (q 6hr x 3)  Result Value Ref Range   Troponin I <0.03 <0.031 ng/mL  CBC  Result Value Ref Range   WBC 5.9 4.0 - 10.5 K/uL   RBC 3.78 (L) 3.87 - 5.11 MIL/uL   Hemoglobin 11.7 (L) 12.0 - 15.0 g/dL   HCT 35.0 (L) 36.0 - 46.0 %   MCV 92.6 78.0 - 100.0 fL   MCH 31.0 26.0 - 34.0 pg   MCHC 33.4 30.0 - 36.0 g/dL   RDW 12.1 11.5 - 15.5 %   Platelets 276 150 - 400 K/uL  Creatinine, serum  Result Value Ref Range   Creatinine, Ser 0.82 0.44 - 1.00 mg/dL   GFR calc non Af Amer >60 >60 mL/min   GFR calc Af Amer >60 >60 mL/min  CBC  Result Value Ref Range   WBC 5.3 4.0 - 10.5 K/uL   RBC 3.88 3.87 - 5.11 MIL/uL   Hemoglobin 12.0  12.0 - 15.0 g/dL   HCT 36.0 36.0 - 46.0 %   MCV 92.8 78.0 - 100.0 fL   MCH 30.9 26.0 - 34.0 pg   MCHC 33.3 30.0 - 36.0 g/dL   RDW 12.2 11.5 - 15.5 %   Platelets 283 150 - 400 K/uL  Basic metabolic panel  Result Value Ref Range   Sodium 140 135 - 145 mmol/L   Potassium 4.1 3.5 - 5.1 mmol/L   Chloride 104 101 - 111 mmol/L   CO2 30 22 - 32 mmol/L   Glucose, Bld 153 (H) 65 - 99 mg/dL   BUN 14 6 - 20 mg/dL   Creatinine, Ser 0.79 0.44 - 1.00 mg/dL   Calcium 9.2 8.9 - 10.3 mg/dL   GFR calc non Af Amer >60 >60 mL/min   GFR calc Af Amer >60 >60 mL/min   Anion gap 6 5 - 15  Glucose, capillary  Result Value Ref Range   Glucose-Capillary 89 65 - 99 mg/dL   Comment 1 Notify RN   Glucose, capillary  Result Value Ref Range   Glucose-Capillary 152 (H) 65 -  99 mg/dL  Glucose, capillary  Result Value Ref Range   Glucose-Capillary 159 (H) 65 - 99 mg/dL  Glucose, capillary  Result Value Ref Range   Glucose-Capillary 239 (H) 65 - 99 mg/dL  Glucose, capillary  Result Value Ref Range   Glucose-Capillary 152 (H) 65 - 99 mg/dL  Glucose, capillary  Result Value Ref Range   Glucose-Capillary 180 (H) 65 - 99 mg/dL  POC CBG, ED  Result Value Ref Range   Glucose-Capillary 263 (H) 65 - 99 mg/dL   EKG/XRAY:   Primary read interpreted by Dr. Everlene Farrier at [facility].  ASSESSMENT/PLAN: Referral made to Dr. Velvet Bathe.. Will call Dr. Alan Ripper office and see if we get a sooner appointment. Initial pulse ox was not accurate. Pulse ox ranges from 95-96. Patient currently under treatment for suspected pericarditis. Will check sedimentation rate, CRP, and' \\ANA'$   And RA to be complete. Sedimentation rate returned mildly elevated at 42.  Gross sideeffects, risk and benefits, and alternatives of medications d/w patient. Patient is aware that all medications have potential sideeffects and we are unable to predict every sideeffect or drug-drug interaction that may occur.    [name] MD 06/05/2015 11:20 AM

## 2015-06-06 ENCOUNTER — Encounter (HOSPITAL_COMMUNITY): Payer: Medicare Other

## 2015-06-06 LAB — ANA: Anti Nuclear Antibody(ANA): NEGATIVE

## 2015-06-07 ENCOUNTER — Ambulatory Visit (INDEPENDENT_AMBULATORY_CARE_PROVIDER_SITE_OTHER): Payer: Medicare Other | Admitting: Cardiology

## 2015-06-07 ENCOUNTER — Encounter: Payer: Self-pay | Admitting: Cardiology

## 2015-06-07 VITALS — BP 154/72 | HR 71 | Ht 68.0 in | Wt 247.0 lb

## 2015-06-07 DIAGNOSIS — I309 Acute pericarditis, unspecified: Secondary | ICD-10-CM

## 2015-06-07 DIAGNOSIS — I351 Nonrheumatic aortic (valve) insufficiency: Secondary | ICD-10-CM

## 2015-06-07 DIAGNOSIS — R072 Precordial pain: Secondary | ICD-10-CM

## 2015-06-07 NOTE — Patient Instructions (Addendum)
Medication Instructions:    DR [name] HAS WRITTEN A HARD SCRIPT FOR YOU TO TAKE TO YOUR PHARMACY FOR YOU TO START TAKING PREDNISONE TAPER----TAKE PREDNISONE 40 MG ONCE DAILY TIMES 3 DAYS, THEN TAKE PREDNISONE 20 MG ONCE DAILY TIMES 3 DAYS, THEN TAKE PREDNISONE 10 MG ONCE DAILY TIMES 3 DAYS, THEN TAKE PREDNISONE 5 MG ONCE DAILY TIMES 3 DAYS.  TAKE THIS WITH FOOD.      Follow-Up:  ON June 23, 2015 AT 10:00 AM WITH CHRIS BERGE NP HERE AT OUR OFFICE FOR FOLLOW-UP OF YOUR ACUTE PERICARDITIS AND PREDNISONE TAPER

## 2015-06-07 NOTE — Progress Notes (Signed)
Patient ID: [name], female   DOB: 09/29/1956, 59 y.o.   MRN: [id number]    Cardiology Office Note   Date:  06/07/2015   ID:  [name], DOB 1955-10-22, MRN [id number]  PCP:  Jenny Reichmann, MD  Cardiologist:   Dorothy Spark, MD     History of Present Illness: [name] is a 59 y.o. female who has been followed by Dr Harrington Challenger for history of mild to mod AI, DM, Hx of CP in past  Felt musculoskeletal,  Neg myoview in 2013. 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  She was admitted on 8/11 with acute chest pain, ACS was ruled out. Given concern the nature of her atypical chest pain with pleuritic features, she had a a ct chest which ruled out PE, but showed mild increased size of her pericardial effusion. Additionally there was concern for pulmonary artery hypertension on CT so an echo was obtained which was not significant for pulmonary artery hypertension. It did show grade 1 diastolic dysfunction but normal ejection fraction of 55-60%. Given the positional nature of her chest pain, there was concern she may have pericarditis as a cause of her increased pericardial effusion. Cardiology was called to evaluate her chest pain and consider her pain likely secondary to pericarditis with an element of costochondritis. After confirmation that she did not have ACS, she was started on a two week course of motrin and colchicine for pericarditis.  Today she states that she continues to have significant chest pain and SOB. No relief yet, the pain wakes her up at night. No orthopnea, PND, LE edema, no syncope.    Current Outpatient Prescriptions  Medication Sig Dispense Refill  . albuterol (PROVENTIL HFA;VENTOLIN HFA) 108 (90 BASE) MCG/ACT inhaler Every 12 hours as needed 1 Inhaler 6  . aspirin EC 81 MG EC tablet Take  1 tablet (81 mg total) by mouth daily. 30 tablet 5  . Blood Glucose Monitoring Suppl (ONE TOUCH ULTRA MINI) W/DEVICE KIT 2 (two) times daily. as directed  3  . colchicine 0.6 MG tablet Take 1 tablet (0.6 mg total) by mouth 2 (two) times daily. 28 tablet 0  . HYDROmorphone (DILAUDID) 2 MG tablet Take 2 mg by mouth every 8 (eight) hours as needed. for pain  0  . ibuprofen (ADVIL,MOTRIN) 600 MG tablet Take 1 tablet (600 mg total) by mouth 3 (three) times daily. 50 tablet 3  . insulin aspart protamine- aspart (NOVOLOG MIX 70/30) (70-30) 100 UNIT/ML injection 35 units in the morning and 25 units at night    . ONE TOUCH ULTRA TEST test strip 3 (three) times daily. for testing  12   No current facility-administered medications for this visit.   Allergies:   Hydrocodone; Morphine and related; and Nsaids   Past Medical History  Diagnosis Date  . Insomnia   . Nephrolithiasis   . Obesity   . Erythema nodosum   . Aortic insufficiency   . Allergy   . Anemia   . COPD (chronic obstructive pulmonary disease)   . Heart murmur   . Myalgia   . Kidney stones   . Aortic regurgitation   . Pulmonary fibrosis 01/20/2012    Due to previous surgery and chest radiation for lung cancer  . Pneumothorax, spontaneous, tension 01/20/2012    October, 2010  .  lung ca dx'd 10/1999    chemo/xrt comp 05/29/2000  . 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 Diabetes mellitus   . Shortness of breath dyspnea     Past Surgical History  Procedure Laterality Date  . Chest surgery      bilateral lung  . Shoulder surgery      right  . Inner ear surgery      right   . Abdominal hysterectomy    . Colonoscopy    . Upper gastrointestinal endoscopy    . Total knee arthroplasty      RIGHT  . Carpal tunnel release      RIGHT in 1998/Left hand in 2009  . Pulmonary embolism surgery  2007    LUNG COLLAPSE  .  Esophageal dilation    . Knee operation      LEFT LATERAL RELEASE  . Foot surgery      Callous removed /Bil  . Appendectomy       Social History:  The patient  reports that she quit smoking about 15 years ago. She has never used smokeless tobacco. She reports that she does not drink alcohol or use illicit drugs.   Family History:  The patient's family history includes Diabetes in her father, maternal grandmother, mother, paternal grandmother, and sister; Heart disease in her father; Hypertension in her sister; Pancreatic cancer in her mother.    ROS:  Please see the history of present illness. All other systems are reviewed and  Negative to the above problem except as noted.    PHYSICAL EXAM: VS:  BP 154/72 mmHg  Pulse 71  Ht '5\' 8"'  (1.727 m)  Wt 247 lb (112.038 kg)  BMI 37.56 kg/m2  SpO2 98%  GEN: Well nourished, well developed, in no acute distress HEENT: normal Neck: no JVD, carotid bruits, or masses Cardiac: RRR;  Gr II/VI systolic murmur LUSB, holosystolic rub, no gallops,no edema  Respiratory:  clear to auscultation bilaterally, normal work of breathing GI: soft, nontender, nondistended, + BS  No hepatomegaly  MS: no deformity Moving all extremities   Skin: warm and dry, no rash Neuro:  Strength and sensation are intact Psych: euthymic mood, full affect  EKG:  EKG is ordered today.  SR81 bpm    Lipid Panel    Component Value Date/Time   CHOL 184 04/18/2015 1451   TRIG 150* 04/18/2015 1451   HDL 50 04/18/2015 1451   CHOLHDL 3.7 04/18/2015 1451   VLDL 30 04/18/2015 1451   LDLCALC 104* 04/18/2015 1451    Wt Readings from Last 3 Encounters:  06/07/15 247 lb (112.038 kg)  06/05/15 244 lb 9.6 oz (110.95 kg)  05/31/15 246 lb 11.2 oz (111.902 kg)    TTE: 05/31/2015  - Left ventricle: The cavity size was normal. Wall thickness was normal. Systolic function was normal. The estimated ejection fraction was in the range of 55% to 60%. Wall motion was  normal; there were no regional wall motion abnormalities. There was an increased relative contribution of atrial contraction to ventricular filling. Doppler parameters are consistent with abnormal left ventricular relaxation (grade 1 diastolic dysfunction). - Aortic valve: There was moderate regurgitation directed centrally in the LVOT.  Impressions: - Little change from 2014 report.    ASSESSMENT AND PLAN:  1. Acute pericarditis - I have personally reviewed chest CTA from 05/30/15 and 01/09/2015 - she does have a chronic  pericardial effusion but its the amount has increased, more importantly her pericardium that has been thickened just locally is now significantly thickened circumferentially. She doesn't have signs of constriction. I believe that he have to be aggressive to prevent it. I will start her on steroid taper, Prednisone 40 mg po x 3 days, followed by 20 mg po x 3 days, followed by 10 mg po x 3 days, followed by 5 mg po x 3 days. Continue colchicine and ibuprofen. She has a lod rub. We will see her in the clinic in 2 weeks.  We will need to follow her glucose closely.  2. AI - moderat eon the most recent echo    Follow up in 2 weeks.   Signed, Dorothy Spark, MD  06/07/2015 3:22 PM    [facility] [place], [place], [place]  [address] Phone: [phone]; Fax: [phone]

## 2015-06-08 ENCOUNTER — Encounter (HOSPITAL_COMMUNITY): Payer: Medicare Other

## 2015-06-12 ENCOUNTER — Telehealth: Payer: Self-pay | Admitting: Pulmonary Disease

## 2015-06-12 NOTE — Telephone Encounter (Signed)
Error.Stanley A Dalton ° °

## 2015-06-13 ENCOUNTER — Encounter (HOSPITAL_COMMUNITY): Payer: Medicare Other

## 2015-06-15 ENCOUNTER — Encounter (HOSPITAL_COMMUNITY): Payer: Medicare Other

## 2015-06-20 ENCOUNTER — Encounter: Payer: Medicare Other | Admitting: Cardiology

## 2015-06-20 ENCOUNTER — Encounter (HOSPITAL_COMMUNITY): Payer: Medicare Other

## 2015-06-20 NOTE — Addendum Note (Signed)
Encounter addended by: Lance Morin, RN on: 06/20/2015  9:55 AM<BR>     Documentation filed: Notes Section

## 2015-06-20 NOTE — Progress Notes (Signed)
Discharge Note from Pulmonary Rehab  [name] has been discharged from pulmonary rehab due to pericarditis which patient is being treated for presently.  She is on steroids and is still experiencing chest pain on inspiration.  She did not meet her goals for the program which were to lose weight and breathe better.  She did attend 6 exercise sessions.  When the pericarditis resolves she is aware that she may ask her physician for a referral to our program.

## 2015-06-22 ENCOUNTER — Encounter (HOSPITAL_COMMUNITY): Payer: Medicare Other

## 2015-06-23 ENCOUNTER — Ambulatory Visit (INDEPENDENT_AMBULATORY_CARE_PROVIDER_SITE_OTHER): Payer: Medicare Other | Admitting: Nurse Practitioner

## 2015-06-23 ENCOUNTER — Encounter: Payer: Self-pay | Admitting: Nurse Practitioner

## 2015-06-23 VITALS — BP 130/80 | HR 94 | Ht 67.0 in | Wt 245.0 lb

## 2015-06-23 DIAGNOSIS — E119 Type 2 diabetes mellitus without complications: Secondary | ICD-10-CM | POA: Insufficient documentation

## 2015-06-23 DIAGNOSIS — Z794 Long term (current) use of insulin: Secondary | ICD-10-CM

## 2015-06-23 DIAGNOSIS — I309 Acute pericarditis, unspecified: Secondary | ICD-10-CM

## 2015-06-23 DIAGNOSIS — R079 Chest pain, unspecified: Secondary | ICD-10-CM | POA: Diagnosis not present

## 2015-06-23 DIAGNOSIS — R9439 Abnormal result of other cardiovascular function study: Secondary | ICD-10-CM

## 2015-06-23 DIAGNOSIS — I319 Disease of pericardium, unspecified: Secondary | ICD-10-CM | POA: Insufficient documentation

## 2015-06-23 MED ORDER — PREDNISONE 10 MG PO TABS: 10.0000 mg | ORAL_TABLET | Freq: Every day | ORAL | Status: DC

## 2015-06-23 MED ORDER — COLCHICINE 0.6 MG PO TABS: 0.6000 mg | ORAL_TABLET | Freq: Two times a day (BID) | ORAL | Status: DC

## 2015-06-23 MED ORDER — PANTOPRAZOLE SODIUM 40 MG PO TBEC: 40.0000 mg | DELAYED_RELEASE_TABLET | Freq: Every day | ORAL | Status: DC

## 2015-06-23 NOTE — Patient Instructions (Signed)
Medication Instructions:  Your physician has recommended you make the following change in your medication:  1. Start protonix ( 40 mg) daily 2. Re-start Colchine (0.6 mg ) twice daily 3. Start Prednisone Taper pack ( 10 mg )  Take 6 pills for 3 days Take 5 pills for 3 days Take 4 pills for 3 days Take 3 pills for 3 days Take 2 pills for 3 days Take 1 pill for 3 days until finished   Labwork: -None  Testing/Procedures: Your physician has requested that you have a lexiscan myoview. For further information please visit [email or web address]. Please follow instruction sheet, as given.    Follow-Up: Your physician recommends that you keep your scheduled  follow-up appointment with Richardson Dopp, PA-C   Any Other Special Instructions Will Be Listed Below (If Applicable).

## 2015-06-23 NOTE — Progress Notes (Signed)
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  Past Medical History   Past Medical History  Diagnosis Date  . Insomnia   . Nephrolithiasis   . Obesity   . Erythema nodosum   . Aortic insufficiency   . Allergy   . Anemia   . COPD (chronic obstructive pulmonary disease)   . Heart murmur   . Myalgia   . Kidney stones   . Aortic regurgitation   . Pulmonary fibrosis 01/20/2012    Due to previous surgery and chest radiation for lung cancer  . Pneumothorax, spontaneous, tension 01/20/2012    October, 2010  . lung ca dx'd 10/1999    chemo/xrt comp 05/29/2000  . 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 Type II diabetes mellitus   . Pericarditis   . Pericardial effusion     a. 05/2015 Echo: EF 55-60%, no rwma, Gr 1 DD,   . Chest pain     a. 2013 neg MV.   Past Surgical History  Procedure Laterality Date  . Chest surgery      bilateral lung  . Shoulder surgery      right  . Inner ear surgery      right   . Abdominal hysterectomy    . Colonoscopy    . Upper gastrointestinal endoscopy    . Total knee arthroplasty      RIGHT  . Carpal tunnel release      RIGHT in 1998/Left hand in 2009  . Pulmonary embolism surgery  2007    LUNG COLLAPSE  . Esophageal dilation    . Knee operation      LEFT LATERAL RELEASE  . Foot surgery      Callous removed /Bil  . Appendectomy      Allergies  Allergies  Allergen Reactions  . Hydrocodone Hives and Itching  . Morphine And Related Other (See Comments)    GI PROBLEMS  . Nsaids Other (See Comments)    GI  PROBLEMS    HPI  59 year old female with the above complex problem list. She was admitted on August 11 with chest pain. She ruled out. CT was negative for PE but did show mild increased size of chronic pericardial effusion. Echo showed normal LV function. She was placed on colchicine and ibuprofen therapy for presumed pericarditis. She was seen back on August 17 by Dr. [name] and continued to complain of chest pain. Her echo was reviewed and it was felt that the amount of fluid in her pericardial effusion had increased from her echo in March 2016. Pericardium was also felt to be thickened. She was placed on a prednisone taper. Patient says that the prednisone taper helped a little bit with her chest discomfort but not much. She finished that last week and then ran out of colchicine one week ago. She continues to have discomfort when lying down at night that is improved by sitting up. Chest pains also worse with deep breathing. She is chronic dyspnea on exertion. She denies PND, dizziness, syncope, edema, or early satiety.  Home Medications  Prior to  Admission medications   Medication Sig Start Date End Date Taking? Authorizing Provider  albuterol (PROVENTIL HFA;VENTOLIN HFA) 108 (90 BASE) MCG/ACT inhaler Every 12 hours as needed 03/14/15  Yes Rigoberto Noel, MD  aspirin EC 81 MG EC tablet Take 1 tablet (81 mg total) by mouth daily. 06/01/15  Yes Alyssa A Lincoln Brigham, MD  Blood Glucose Monitoring Suppl (ONE TOUCH ULTRA MINI) W/DEVICE KIT 2 (two) times daily. as directed 05/03/15  Yes Historical Provider, MD  HYDROmorphone (DILAUDID) 2 MG tablet Take 2 mg by mouth every 8 (eight) hours as needed. for pain 05/02/15  Yes Historical Provider, MD  insulin aspart protamine- aspart (NOVOLOG MIX 70/30) (70-30) 100 UNIT/ML injection 35 units in the morning and 25 units at night   Yes Historical Provider, MD  ONE TOUCH ULTRA TEST test strip 3 (three) times daily. for testing 05/03/15  Yes Historical Provider, MD    colchicine 0.6 MG tablet Take 1 tablet (0.6 mg total) by mouth 2 (two) times daily. 06/23/15 07/07/15  Rogelia Mire, NP  pantoprazole (PROTONIX) 40 MG tablet Take 1 tablet (40 mg total) by mouth daily. 06/23/15   Rogelia Mire, NP  predniSONE (DELTASONE) 10 MG tablet Take 1 tablet (10 mg total) by mouth daily with breakfast. See taper dose instructions 06/23/15   Rogelia Mire, NP    Review of Systems  As above, she is chronic dyspnea exertion and continues to have pleuritic chest discomfort.  All other systems reviewed and are otherwise negative except as noted above.  Physical Exam  VS:  BP 130/80 mmHg  Pulse 94  Ht _0  (1.702 m)  Wt 245 lb (111.131 kg)  BMI 38.36 kg/m2 , BMI Body mass index is 38.36 kg/(m^2). she does not have a pulsus paradoxus GEN: Well nourished, well developed, in no acute distress. HEENT: normal. Neck: Supple, no JVD, carotid bruits, or masses. Cardiac: RRR, 2/6 systolic murmur loudest at the right upper sternal border the soft rub at the apex. No gallops. No clubbing, cyanosis, edema.  Radials/DP/PT 2+ and equal bilaterally.  Respiratory:  Respirations regular and unlabored, clear to auscultation bilaterally. GI: Soft, nontender, nondistended, BS + x 4. MS: no deformity or atrophy. Skin: warm and dry, no rash. Neuro:  Strength and sensation are intact. Psych: Normal affect.  Assessment & Plan  1.  Acute pericarditis with chronic pericardial effusion: She had some but not significant relief with most recent steroids taper. She continues to have pleuritic chest discomfort as well as chest discomfort while lying in bed at night. I will give her a second go round of steroids taper but start at 60 mg and bring her down over 18 days. I will also resume her colchicine which she ran out of last Friday. As she has been on prolonged steroids/nsaids, i have also Rx protonix 40 qd.  She will continue to watch her sugars closely and we will arrange for  follow-up in approximately 2-3 weeks.  2. Chest pain: Patient was produces scheduled to have stress testing related to her chest pain which is more than likely related to her pericarditis. She never had a stress test because of a foot problem earlier this year. She does have risk factors for coronary artery disease. I will arrange for a Myoview to rule out ischemia and remove this from the picture as a potential cause of her chest pain.   3. Moderate aortic insufficiency: Follow up echo in a year or sooner if necessary.  4. Disposition:  Follow-up in approximately 2-3 weeks.  Murray Hodgkins, NP 06/23/2015, 6:38 PM

## 2015-06-27 ENCOUNTER — Encounter: Payer: Self-pay | Admitting: Adult Health

## 2015-06-27 ENCOUNTER — Telehealth (HOSPITAL_COMMUNITY): Payer: Self-pay

## 2015-06-27 ENCOUNTER — Ambulatory Visit (INDEPENDENT_AMBULATORY_CARE_PROVIDER_SITE_OTHER): Payer: Medicare Other | Admitting: Adult Health

## 2015-06-27 ENCOUNTER — Encounter: Payer: Self-pay | Admitting: Gastroenterology

## 2015-06-27 ENCOUNTER — Encounter (HOSPITAL_COMMUNITY): Payer: Medicare Other

## 2015-06-27 VITALS — BP 128/72 | HR 88 | Temp 97.8°F | Ht 67.0 in | Wt 248.0 lb

## 2015-06-27 DIAGNOSIS — I319 Disease of pericardium, unspecified: Secondary | ICD-10-CM

## 2015-06-27 DIAGNOSIS — J984 Other disorders of lung: Secondary | ICD-10-CM

## 2015-06-27 NOTE — Progress Notes (Signed)
Subjective:    Patient ID: [name], female    DOB: 02-13-1956, 59 y.o.   MRN: [id number]  HPI 58/ F, with dyspnea is attributed to restrictive lung disease s/p RUlobectomy for spont pneumothx in 10/07, s/p chemo/ XRT for stg III B lung cancer in 2001 - in remission since!   Underwent rehab in 2010.   PFTs in 11/2006 and spirometry in 06/2008 consistent with moderate restrictive lung disease. Lung volumes were decreased as was diffusion capacity, but this corrected for alveolar volume.  Spirometry 09/2009 showed decline in FEV1 from 57 to 40%. PFT 10/2009 showed FEV1 51% & FVC 47%, DLCO was 47% but corrected for alveolar volume        Significant tests/ events  CT scan from feb'11, has shown stable postoperative changes and nodular scarring in the left lung.   07/2006 right upper lobectomy and pleural abrasions for a spontaneous right pneumothorax by Dr. Roxan Hockey   01/2014 CPET >> decreased exercise tolerance, peak VO2 56% of predicted, no ventilatory limitation, no cardiac limitation-O2 pulse 73% predicted, preexercise PFTs-FVC 51%, FEV1 54%, ratio 84, MVV 65%  CT chest 12/2014 >> postop changes in both lungs, no evidence of tumor recurrence.  Echo 12/2014 -mild AI, normal LV function   06/27/2015 [facility] follow up  Patient presents for a post hospital follow-up. Patient was recently admitted to the hospital August night for chest pain.  Patient underwent a CT chest that ruled out pulmonary embolism. Cardiac enzymes were negative. Did show a slight increase in a small pericardial effusion and was felt to have a possible acute pericarditis. 2-D echo showed grade 1 diastolic dysfunction with an ejection fraction of 55% with no significant pulmonary artery hypertension. Patient was treated with colchicine and Motrin. Patient was seen by cardiology in post hospital follow-up with persistent pleuritic type chest wall pain. She was given 2 courses of prednisone, which she still  has a few days left. She is having some improvement but has some intermittent chest wall pain. Patient was concerned that she had developed pulmonary artery hypertension. However, aches went over her 2-D echo results that did not show any significant elevated pressures.  She denies any hemoptysis, orthopnea, PND, or increased leg swelling.  Review of Systems Constitutional:   No  weight loss, night sweats,  Fevers, chills,  +fatigue, or  lassitude.  HEENT:   No headaches,  Difficulty swallowing,  Tooth/dental problems, or  Sore throat,                No sneezing, itching, ear ache, nasal congestion, post nasal drip,   CV:  No chest pain,  Orthopnea, PND, swelling in lower extremities, anasarca, dizziness, palpitations, syncope.   GI  No heartburn, indigestion, abdominal pain, nausea, vomiting, diarrhea, change in bowel habits, loss of appetite, bloody stools.   Resp:    No chest wall deformity  Skin: no rash or lesions.  GU: no dysuria, change in color of urine, no urgency or frequency.  No flank pain, no hematuria   MS:  No joint pain or swelling.  No decreased range of motion.  No back pain.  Psych:  No change in mood or affect. No depression or anxiety.  No memory loss.         Objective:   Physical Exam  GEN: A/Ox3; pleasant , NAD, obese   HEENT:  [place]/AT,  EACs-clear, TMs-wnl, NOSE-clear, THROAT-clear, no lesions, no postnasal drip or exudate noted.   NECK:  Supple w/  fair ROM; no JVD; normal carotid impulses w/o bruits; no thyromegaly or nodules palpated; no lymphadenopathy.  RESP  Clear  P & A; w/o, wheezes/ rales/ or rhonchi.no accessory muscle use, no dullness to percussion  CARD:  RRR, no m/r/g  , no peripheral edema, pulses intact, no cyanosis or clubbing.  GI:   Soft & nt; nml bowel sounds; no organomegaly or masses detected.  Musco: Warm bil, no deformities or joint swelling noted.   Neuro: alert, no focal deficits noted.    Skin: Warm, no lesions or  rashes        Assessment & Plan:

## 2015-06-27 NOTE — Telephone Encounter (Signed)
Patient given detailed instructions per Myocardial Perfusion Study Information Sheet for test on 06-29-2015 at 1230. Patient notified to arrive 15 minutes early and that it is imperative to arrive on time for appointment to keep from having the test rescheduled.  If you need to cancel or reschedule your appointment, please call the office within 24 hours of your appointment. Failure to do so may result in a cancellation of your appointment, and a $50 no show fee. Patient verbalized understanding. Oletta Lamas, Patsy A

## 2015-06-27 NOTE — Assessment & Plan Note (Signed)
Restrictive lung disease w/ previous RUlobectomy and and XRT for lung cancer.  Previous PFT has showed restrictive process.  Cont on current regimen

## 2015-06-27 NOTE — Assessment & Plan Note (Signed)
Recent extensive workup , cont tx with steroids and NSAIDS for pericarditis.

## 2015-06-27 NOTE — Patient Instructions (Signed)
Continue with follow up with Cardiology as planned  Follow up with Dr. Elsworth Soho  In 4 months and As needed

## 2015-06-28 NOTE — Progress Notes (Signed)
Reviewed & agree with plan  

## 2015-06-29 ENCOUNTER — Encounter (HOSPITAL_COMMUNITY): Payer: Medicare Other

## 2015-06-29 ENCOUNTER — Ambulatory Visit (HOSPITAL_COMMUNITY): Payer: Medicare Other | Attending: Cardiovascular Disease

## 2015-06-29 DIAGNOSIS — I309 Acute pericarditis, unspecified: Secondary | ICD-10-CM

## 2015-06-29 DIAGNOSIS — R079 Chest pain, unspecified: Secondary | ICD-10-CM

## 2015-06-29 MED ORDER — REGADENOSON 0.4 MG/5ML IV SOLN
0.4000 mg | Freq: Once | INTRAVENOUS | Status: AC
  Administered 2015-06-29: 0.4 mg via INTRAVENOUS

## 2015-06-29 MED ORDER — TECHNETIUM TC 99M SESTAMIBI GENERIC - CARDIOLITE
32.9000 | Freq: Once | INTRAVENOUS | Status: AC | PRN
  Administered 2015-06-29: 32.9 via INTRAVENOUS

## 2015-06-29 MED ORDER — AMINOPHYLLINE 25 MG/ML IV SOLN
75.0000 mg | Freq: Once | INTRAVENOUS | Status: AC
  Administered 2015-06-29: 75 mg via INTRAVENOUS

## 2015-06-30 ENCOUNTER — Ambulatory Visit (HOSPITAL_COMMUNITY): Payer: Medicare Other | Attending: Internal Medicine

## 2015-06-30 DIAGNOSIS — R079 Chest pain, unspecified: Secondary | ICD-10-CM

## 2015-06-30 LAB — MYOCARDIAL PERFUSION IMAGING
LV dias vol: 122 mL
LV sys vol: 58 mL
Peak HR: 88 {beats}/min
RATE: 0.24
Rest HR: 63 {beats}/min
SDS: 6
SRS: 1
SSS: 7
TID: 1.04

## 2015-06-30 MED ORDER — TECHNETIUM TC 99M SESTAMIBI GENERIC - CARDIOLITE
31.1000 | Freq: Once | INTRAVENOUS | Status: AC | PRN
  Administered 2015-06-30: 31.1 via INTRAVENOUS

## 2015-07-04 ENCOUNTER — Telehealth: Payer: Self-pay | Admitting: *Deleted

## 2015-07-04 ENCOUNTER — Ambulatory Visit (AMBULATORY_SURGERY_CENTER): Payer: Self-pay | Admitting: *Deleted

## 2015-07-04 ENCOUNTER — Encounter (HOSPITAL_COMMUNITY): Payer: Medicare Other

## 2015-07-04 ENCOUNTER — Encounter: Payer: Self-pay | Admitting: *Deleted

## 2015-07-04 VITALS — Ht 67.5 in | Wt 252.2 lb

## 2015-07-04 DIAGNOSIS — I309 Acute pericarditis, unspecified: Secondary | ICD-10-CM

## 2015-07-04 DIAGNOSIS — K319 Disease of stomach and duodenum, unspecified: Secondary | ICD-10-CM

## 2015-07-04 DIAGNOSIS — I319 Disease of pericardium, unspecified: Secondary | ICD-10-CM

## 2015-07-04 DIAGNOSIS — Z01812 Encounter for preprocedural laboratory examination: Secondary | ICD-10-CM

## 2015-07-04 DIAGNOSIS — R9439 Abnormal result of other cardiovascular function study: Secondary | ICD-10-CM | POA: Insufficient documentation

## 2015-07-04 NOTE — Progress Notes (Signed)
Patient denies any allergies to egg or soy products. Patient denies complications with anesthesia/sedation.  Patient denies oxygen use at home and denies diet medications. Emmi instructions for endoscopy explained but patient refused.

## 2015-07-04 NOTE — Telephone Encounter (Signed)
-----   Message from Dorothy Spark, MD sent at 07/04/2015  3:03 PM EDT ----- Abnormal stress test, [name], please schedule for a cath Thank you, [name]  ----- Message -----    From: Rogelia Mire, NP    Sent: 07/03/2015   9:04 AM      To: Dorothy Spark, MD, Nuala Alpha, LPN  [name], This is a patient of katarina's that I saw in [facility]. She has a h/o pericarditis with ongoing c/p.  She was supposed to have nuc study in the spring but never did, so I ordered.  It's an intermediate study with possible apical anterior, apical septal, and apical ischemia (vs attenuation).  Please review with [name] as given her risk factors and ongoing c/p, I suspect she'll want to cath her, in which case the patient should either be seen by [facility] or added onto a [facility] schedule on a day that [place] is in clinic.  Thanks,  Gerald Stabs

## 2015-07-04 NOTE — Addendum Note (Signed)
Addended by: Dorothy Spark on: 07/04/2015 05:42 PM   Modules accepted: Orders

## 2015-07-04 NOTE — Telephone Encounter (Signed)
Notified the pt that per Dr [name] her stress test was abnormal and she recommends the pt to be scheduled for a left cardiac cath.  Pt verbalized understanding and agrees with this plan. Pt requesting her cath to be scheduled for next Tuesday 07/11/15 if possible.  Scheduled the pts cath for next Tuesday, as requested, at [facility] with Dr Irish Lack at [place], be there at [place].  Pt aware of this time and date, and agrees with this.  Scheduled the pt for pre-cath labs to be done this Friday 07/07/15 at our office to check PT/INR, CMET, and CBC W DIFF.  Pt aware of this lab appt.  Informed the pt when she comes into her lab appt this Friday, there will be a cath letter of instruction for her to pick up that will inform her of cath date, time, location, and pre-cath instructions.  Briefly went over the cath instructions with the pt over the phone.   Pt verbalized understanding of plan mentioned above and agrees with this plan.  Will send Dr [name] a message to place hospital orders in the system.

## 2015-07-06 ENCOUNTER — Encounter (HOSPITAL_COMMUNITY): Payer: Medicare Other

## 2015-07-07 ENCOUNTER — Telehealth: Payer: Self-pay | Admitting: *Deleted

## 2015-07-07 ENCOUNTER — Other Ambulatory Visit (INDEPENDENT_AMBULATORY_CARE_PROVIDER_SITE_OTHER): Payer: Medicare Other | Admitting: *Deleted

## 2015-07-07 DIAGNOSIS — Z5181 Encounter for therapeutic drug level monitoring: Secondary | ICD-10-CM | POA: Diagnosis not present

## 2015-07-07 DIAGNOSIS — Z01812 Encounter for preprocedural laboratory examination: Secondary | ICD-10-CM

## 2015-07-07 DIAGNOSIS — I309 Acute pericarditis, unspecified: Secondary | ICD-10-CM

## 2015-07-07 DIAGNOSIS — I319 Disease of pericardium, unspecified: Secondary | ICD-10-CM

## 2015-07-07 DIAGNOSIS — R9439 Abnormal result of other cardiovascular function study: Secondary | ICD-10-CM

## 2015-07-07 DIAGNOSIS — E876 Hypokalemia: Secondary | ICD-10-CM

## 2015-07-07 LAB — COMPREHENSIVE METABOLIC PANEL
ALT: 16 U/L (ref 0–35)
AST: 12 U/L (ref 0–37)
Albumin: 3.5 g/dL (ref 3.5–5.2)
Alkaline Phosphatase: 105 U/L (ref 39–117)
BUN: 21 mg/dL (ref 6–23)
CO2: 34 mEq/L — ABNORMAL HIGH (ref 19–32)
Calcium: 8.7 mg/dL (ref 8.4–10.5)
Chloride: 101 mEq/L (ref 96–112)
Creatinine, Ser: 0.85 mg/dL (ref 0.40–1.20)
GFR: 88.67 mL/min (ref >60.00)
Glucose, Bld: 122 mg/dL — ABNORMAL HIGH (ref 70–99)
Potassium: 2.9 mEq/L — ABNORMAL LOW (ref 3.5–5.1)
Sodium: 142 mEq/L (ref 135–145)
Total Bilirubin: 0.4 mg/dL (ref 0.2–1.2)
Total Protein: 6.4 g/dL (ref 6.0–8.3)

## 2015-07-07 LAB — CBC WITH DIFFERENTIAL/PLATELET
Basophils Absolute: 0 10*3/uL (ref 0.0–0.1)
Basophils Relative: 0.1 % (ref 0.0–3.0)
Eosinophils Absolute: 0.1 10*3/uL (ref 0.0–0.7)
Eosinophils Relative: 0.8 % (ref 0.0–5.0)
HCT: 37.6 % (ref 36.0–46.0)
Hemoglobin: 12.4 g/dL (ref 12.0–15.0)
Lymphocytes Relative: 27.9 % (ref 12.0–46.0)
Lymphs Abs: 3 10*3/uL (ref 0.7–4.0)
MCHC: 33.1 g/dL (ref 30.0–36.0)
MCV: 93.9 fl (ref 78.0–100.0)
Monocytes Absolute: 0.7 10*3/uL (ref 0.1–1.0)
Monocytes Relative: 6.8 % (ref 3.0–12.0)
Neutro Abs: 6.8 10*3/uL (ref 1.4–7.7)
Neutrophils Relative %: 64.4 % (ref 43.0–77.0)
Platelets: 267 10*3/uL (ref 150.0–400.0)
RBC: 4.01 Mil/uL (ref 3.87–5.11)
RDW: 13.2 % (ref 11.5–15.5)
WBC: 10.6 10*3/uL — ABNORMAL HIGH (ref 4.0–10.5)

## 2015-07-07 LAB — PROTIME-INR
INR: 1 ratio (ref 0.8–1.0)
Prothrombin Time: 10.6 s (ref 9.6–13.1)

## 2015-07-07 MED ORDER — POTASSIUM CHLORIDE CRYS ER 10 MEQ PO TBCR: 10.0000 meq | EXTENDED_RELEASE_TABLET | Freq: Every day | ORAL | Status: DC

## 2015-07-07 NOTE — Telephone Encounter (Signed)
Informed the pt that per Dr [name] her labs came back and her K is low at 2.9 and she recommends the pt to start taking KCL 10 mEq po daily, but she should take 40 mEq (4 tabs) the first day only, then resume to 1 pill 10 mEq po daily thereafter. Informed the pt that per Dr [name] she would like to see the pt back in the office next month sometime with lab the same day to check a bmet adn reassess her K level.  Informed the pt that someone from our scheduling dept will contact her to have this appt scheduled with lab same day.  Confirmed the pharmacy of choice with the pt.  Pt verbalized understanding and agrees with this plan.

## 2015-07-07 NOTE — Telephone Encounter (Signed)
-----   Message from Dorothy Spark, MD sent at 07/07/2015  3:27 PM EDT ----- Please start her on KCL 10 mEq to take daily but 40 mEq the first day. She should see me sometimes next month, when we can recheck. Thank you

## 2015-07-07 NOTE — Addendum Note (Signed)
Addended by: Eulis Foster on: 07/07/2015 09:59 AM   Modules accepted: Orders

## 2015-07-11 ENCOUNTER — Telehealth: Payer: Self-pay

## 2015-07-11 ENCOUNTER — Encounter (HOSPITAL_COMMUNITY): Payer: Self-pay | Admitting: Interventional Cardiology

## 2015-07-11 ENCOUNTER — Encounter (HOSPITAL_COMMUNITY): Payer: Medicare Other

## 2015-07-11 ENCOUNTER — Ambulatory Visit: Payer: Medicare Other | Admitting: Physician Assistant

## 2015-07-11 ENCOUNTER — Ambulatory Visit (HOSPITAL_COMMUNITY)
Admission: RE | Admit: 2015-07-11 | Discharge: 2015-07-12 | Disposition: A | Discharge status: Home / Self Care | Payer: Medicare Other | Source: Ambulatory Visit | Attending: Interventional Cardiology | Admitting: Interventional Cardiology

## 2015-07-11 ENCOUNTER — Encounter (HOSPITAL_COMMUNITY)
Admission: RE | Disposition: A | Discharge status: Home / Self Care | Payer: Self-pay | Source: Ambulatory Visit | Attending: Interventional Cardiology

## 2015-07-11 DIAGNOSIS — I313 Pericardial effusion (noninflammatory): Secondary | ICD-10-CM | POA: Insufficient documentation

## 2015-07-11 DIAGNOSIS — I351 Nonrheumatic aortic (valve) insufficiency: Secondary | ICD-10-CM | POA: Diagnosis not present

## 2015-07-11 DIAGNOSIS — E119 Type 2 diabetes mellitus without complications: Secondary | ICD-10-CM | POA: Insufficient documentation

## 2015-07-11 DIAGNOSIS — Z7982 Long term (current) use of aspirin: Secondary | ICD-10-CM | POA: Insufficient documentation

## 2015-07-11 DIAGNOSIS — R9439 Abnormal result of other cardiovascular function study: Secondary | ICD-10-CM | POA: Diagnosis present

## 2015-07-11 DIAGNOSIS — Z955 Presence of coronary angioplasty implant and graft: Secondary | ICD-10-CM | POA: Insufficient documentation

## 2015-07-11 DIAGNOSIS — I2510 Atherosclerotic heart disease of native coronary artery without angina pectoris: Secondary | ICD-10-CM

## 2015-07-11 DIAGNOSIS — J449 Chronic obstructive pulmonary disease, unspecified: Secondary | ICD-10-CM | POA: Diagnosis not present

## 2015-07-11 DIAGNOSIS — I309 Acute pericarditis, unspecified: Secondary | ICD-10-CM | POA: Insufficient documentation

## 2015-07-11 DIAGNOSIS — Z85118 Personal history of other malignant neoplasm of bronchus and lung: Secondary | ICD-10-CM | POA: Insufficient documentation

## 2015-07-11 HISTORY — PX: CARDIAC CATHETERIZATION: SHX172

## 2015-07-11 HISTORY — DX: Unspecified osteoarthritis, unspecified site: M19.90

## 2015-07-11 HISTORY — DX: Atherosclerotic heart disease of native coronary artery without angina pectoris: I25.10

## 2015-07-11 HISTORY — DX: Other disorders of lung: J98.4

## 2015-07-11 HISTORY — DX: Adverse effect of unspecified anesthetic, initial encounter: T41.45XA

## 2015-07-11 HISTORY — DX: Other complications of anesthesia, initial encounter: T88.59XA

## 2015-07-11 LAB — BASIC METABOLIC PANEL
Anion gap: 5 (ref 5–15)
BUN: 17 mg/dL (ref 6–20)
CO2: 33 mmol/L — ABNORMAL HIGH (ref 22–32)
Calcium: 8.8 mg/dL — ABNORMAL LOW (ref 8.9–10.3)
Chloride: 102 mmol/L (ref 101–111)
Creatinine, Ser: 0.89 mg/dL (ref 0.44–1.00)
GFR calc Af Amer: >60 mL/min (ref >60)
GFR calc non Af Amer: >60 mL/min (ref >60)
Glucose, Bld: 135 mg/dL — ABNORMAL HIGH (ref 65–99)
Potassium: 5 mmol/L (ref 3.5–5.1)
Sodium: 140 mmol/L (ref 135–145)

## 2015-07-11 LAB — GLUCOSE, CAPILLARY
Glucose-Capillary: 154 mg/dL — ABNORMAL HIGH (ref 65–99)
Glucose-Capillary: 129 mg/dL — ABNORMAL HIGH (ref 65–99)
Glucose-Capillary: 300 mg/dL — ABNORMAL HIGH (ref 65–99)
Glucose-Capillary: 229 mg/dL — ABNORMAL HIGH (ref 65–99)

## 2015-07-11 LAB — POCT ACTIVATED CLOTTING TIME: Activated Clotting Time: 546 seconds

## 2015-07-11 SURGERY — LEFT HEART CATH AND CORONARY ANGIOGRAPHY
Anesthesia: LOCAL

## 2015-07-11 MED ORDER — PANTOPRAZOLE SODIUM 40 MG PO TBEC
40.0000 mg | DELAYED_RELEASE_TABLET | Freq: Every day | ORAL | Status: DC
  Administered 2015-07-11 – 2015-07-12 (×2): 40 mg via ORAL
  Filled 2015-07-11 (×2): qty 1

## 2015-07-11 MED ORDER — LIDOCAINE HCL (PF) 1 % IJ SOLN
INTRAMUSCULAR | Status: AC
  Filled 2015-07-11: qty 30

## 2015-07-11 MED ORDER — SODIUM CHLORIDE 0.9 % WEIGHT BASED INFUSION: 1.0000 mL/kg/h | INTRAVENOUS | Status: AC

## 2015-07-11 MED ORDER — INSULIN ASPART PROT & ASPART (70-30 MIX) 100 UNIT/ML ~~LOC~~ SUSP
25.0000 [IU] | Freq: Two times a day (BID) | SUBCUTANEOUS | Status: DC
  Administered 2015-07-11 – 2015-07-12 (×2): 25 [IU] via SUBCUTANEOUS
  Filled 2015-07-11: qty 10

## 2015-07-11 MED ORDER — LIDOCAINE HCL (PF) 1 % IJ SOLN
INTRAMUSCULAR | Status: DC | PRN
  Administered 2015-07-11: 09:00:00

## 2015-07-11 MED ORDER — ACETAMINOPHEN 325 MG PO TABS: 650.0000 mg | ORAL_TABLET | ORAL | Status: DC | PRN

## 2015-07-11 MED ORDER — SODIUM CHLORIDE 0.9 % IJ SOLN: 3.0000 mL | INTRAMUSCULAR | Status: DC | PRN

## 2015-07-11 MED ORDER — INSULIN ASPART 100 UNIT/ML ~~LOC~~ SOLN
0.0000 [IU] | Freq: Three times a day (TID) | SUBCUTANEOUS | Status: DC
  Administered 2015-07-11: 8 [IU] via SUBCUTANEOUS
  Administered 2015-07-12: 07:00:00 2 [IU] via SUBCUTANEOUS

## 2015-07-11 MED ORDER — SODIUM CHLORIDE 0.9 % IV SOLN: 250.0000 mL | INTRAVENOUS | Status: DC | PRN

## 2015-07-11 MED ORDER — HEPARIN SODIUM (PORCINE) 1000 UNIT/ML IJ SOLN
INTRAMUSCULAR | Status: AC
  Filled 2015-07-11: qty 1

## 2015-07-11 MED ORDER — BIVALIRUDIN 250 MG IV SOLR
250.0000 mg | INTRAVENOUS | Status: DC | PRN
  Administered 2015-07-11 (×2): 1.75 mg/kg/h via INTRAVENOUS

## 2015-07-11 MED ORDER — SODIUM CHLORIDE 0.9 % IJ SOLN
3.0000 mL | Freq: Two times a day (BID) | INTRAMUSCULAR | Status: DC
  Administered 2015-07-11 – 2015-07-12 (×2): 3 mL via INTRAVENOUS

## 2015-07-11 MED ORDER — ACTIVE PARTNERSHIP FOR HEALTH OF YOUR HEART BOOK
Freq: Once | Status: DC
  Filled 2015-07-11: qty 1

## 2015-07-11 MED ORDER — CLOPIDOGREL BISULFATE 300 MG PO TABS
ORAL_TABLET | ORAL | Status: AC
  Filled 2015-07-11: qty 2

## 2015-07-11 MED ORDER — ASPIRIN 81 MG PO CHEW
81.0000 mg | CHEWABLE_TABLET | Freq: Every day | ORAL | Status: DC
  Administered 2015-07-12: 81 mg via ORAL
  Filled 2015-07-11: qty 1

## 2015-07-11 MED ORDER — HYDRALAZINE HCL 20 MG/ML IJ SOLN
INTRAMUSCULAR | Status: DC | PRN
  Administered 2015-07-11: 10 mg via INTRAVENOUS

## 2015-07-11 MED ORDER — SODIUM CHLORIDE 0.9 % WEIGHT BASED INFUSION: 1.0000 mL/kg/h | INTRAVENOUS | Status: DC

## 2015-07-11 MED ORDER — SODIUM CHLORIDE 0.9 % IV SOLN
0.1500 mg/kg/h | INTRAVENOUS | Status: DC
  Filled 2015-07-11: qty 250

## 2015-07-11 MED ORDER — VERAPAMIL HCL 2.5 MG/ML IV SOLN
INTRAVENOUS | Status: DC | PRN
  Administered 2015-07-11: 08:00:00 via INTRA_ARTERIAL

## 2015-07-11 MED ORDER — FENTANYL CITRATE (PF) 100 MCG/2ML IJ SOLN
INTRAMUSCULAR | Status: AC
  Filled 2015-07-11: qty 4

## 2015-07-11 MED ORDER — ASPIRIN 81 MG PO CHEW: 81.0000 mg | CHEWABLE_TABLET | Freq: Every day | ORAL | Status: DC

## 2015-07-11 MED ORDER — BIVALIRUDIN 250 MG IV SOLR
INTRAVENOUS | Status: AC
  Filled 2015-07-11: qty 250

## 2015-07-11 MED ORDER — HYDRALAZINE HCL 20 MG/ML IJ SOLN
INTRAMUSCULAR | Status: AC
  Filled 2015-07-11: qty 1

## 2015-07-11 MED ORDER — HYDROMORPHONE HCL 2 MG PO TABS: 2.0000 mg | ORAL_TABLET | Freq: Three times a day (TID) | ORAL | Status: DC | PRN

## 2015-07-11 MED ORDER — SODIUM CHLORIDE 0.9 % WEIGHT BASED INFUSION
3.0000 mL/kg/h | INTRAVENOUS | Status: DC
  Administered 2015-07-11: 3 mL/kg/h via INTRAVENOUS

## 2015-07-11 MED ORDER — BIVALIRUDIN BOLUS VIA INFUSION - CUPID
INTRAVENOUS | Status: DC | PRN
  Administered 2015-07-11: 82.35 mg via INTRAVENOUS

## 2015-07-11 MED ORDER — MIDAZOLAM HCL 2 MG/2ML IJ SOLN
INTRAMUSCULAR | Status: DC | PRN
  Administered 2015-07-11: 1 mg via INTRAVENOUS
  Administered 2015-07-11: 2 mg via INTRAVENOUS
  Administered 2015-07-11: 1 mg via INTRAVENOUS

## 2015-07-11 MED ORDER — FENTANYL CITRATE (PF) 100 MCG/2ML IJ SOLN
INTRAMUSCULAR | Status: DC | PRN
  Administered 2015-07-11: 25 ug via INTRAVENOUS
  Administered 2015-07-11: 50 ug via INTRAVENOUS
  Administered 2015-07-11: 25 ug via INTRAVENOUS

## 2015-07-11 MED ORDER — HEPARIN SODIUM (PORCINE) 1000 UNIT/ML IJ SOLN
INTRAMUSCULAR | Status: DC | PRN
  Administered 2015-07-11: 5000 [IU] via INTRAVENOUS

## 2015-07-11 MED ORDER — MIDAZOLAM HCL 2 MG/2ML IJ SOLN
INTRAMUSCULAR | Status: AC
  Filled 2015-07-11: qty 4

## 2015-07-11 MED ORDER — VERAPAMIL HCL 2.5 MG/ML IV SOLN
INTRAVENOUS | Status: AC
  Filled 2015-07-11: qty 2

## 2015-07-11 MED ORDER — METOPROLOL TARTRATE 1 MG/ML IV SOLN
2.5000 mg | INTRAVENOUS | Status: DC | PRN
  Administered 2015-07-11: 20:00:00 2.5 mg via INTRAVENOUS
  Filled 2015-07-11: qty 5

## 2015-07-11 MED ORDER — SODIUM CHLORIDE 0.9 % IJ SOLN: 3.0000 mL | Freq: Two times a day (BID) | INTRAMUSCULAR | Status: DC

## 2015-07-11 MED ORDER — ASPIRIN 81 MG PO CHEW: 81.0000 mg | CHEWABLE_TABLET | ORAL | Status: DC

## 2015-07-11 MED ORDER — IOHEXOL 350 MG/ML SOLN
INTRAVENOUS | Status: DC | PRN
  Administered 2015-07-11: 165 mL via INTRA_ARTERIAL

## 2015-07-11 MED ORDER — POTASSIUM CHLORIDE CRYS ER 20 MEQ PO TBCR
10.0000 meq | EXTENDED_RELEASE_TABLET | Freq: Every day | ORAL | Status: DC
  Administered 2015-07-11 – 2015-07-12 (×2): 10 meq via ORAL
  Filled 2015-07-11 (×2): qty 1

## 2015-07-11 MED ORDER — ASPIRIN EC 81 MG PO TBEC: 81.0000 mg | DELAYED_RELEASE_TABLET | Freq: Every day | ORAL | Status: DC

## 2015-07-11 MED ORDER — CLOPIDOGREL BISULFATE 75 MG PO TABS
75.0000 mg | ORAL_TABLET | Freq: Every day | ORAL | Status: DC
  Administered 2015-07-12: 75 mg via ORAL
  Filled 2015-07-11: qty 1

## 2015-07-11 MED ORDER — ALBUTEROL SULFATE (2.5 MG/3ML) 0.083% IN NEBU: 2.5000 mg | INHALATION_SOLUTION | Freq: Four times a day (QID) | RESPIRATORY_TRACT | Status: DC | PRN

## 2015-07-11 MED ORDER — ASPIRIN 81 MG PO CHEW
CHEWABLE_TABLET | ORAL | Status: AC
  Filled 2015-07-11: qty 1

## 2015-07-11 MED ORDER — ONDANSETRON HCL 4 MG/2ML IJ SOLN: 4.0000 mg | Freq: Four times a day (QID) | INTRAMUSCULAR | Status: DC | PRN

## 2015-07-11 MED ORDER — CLOPIDOGREL BISULFATE 300 MG PO TABS
ORAL_TABLET | ORAL | Status: DC | PRN
  Administered 2015-07-11: 600 mg via ORAL

## 2015-07-11 SURGICAL SUPPLY — 24 items
BALLN EMERGE MR 2.5X15 (BALLOONS) ×3
BALLN ~~LOC~~ EMERGE MR 3.0X8 (BALLOONS) ×3
BALLN ~~LOC~~ EMERGE MR 3.5X20 (BALLOONS) ×3
BALLOON EMERGE MR 2.5X15 (BALLOONS) IMPLANT
BALLOON ~~LOC~~ EMERGE MR 3.0X8 (BALLOONS) IMPLANT
BALLOON ~~LOC~~ EMERGE MR 3.5X20 (BALLOONS) IMPLANT
CATH INFINITI 5 FR JL3.5 (CATHETERS) ×3 IMPLANT
CATH INFINITI 5FR ANG PIGTAIL (CATHETERS) ×3 IMPLANT
CATH INFINITI JR4 5F (CATHETERS) ×3 IMPLANT
DEVICE RAD COMP TR BAND LRG (VASCULAR PRODUCTS) ×3 IMPLANT
GLIDESHEATH SLEND SS 6F .021 (SHEATH) ×3 IMPLANT
GUIDE CATH RUNWAY 6FR CLS3 (CATHETERS) ×1 IMPLANT
KIT ENCORE 26 ADVANTAGE (KITS) ×1 IMPLANT
KIT HEART LEFT (KITS) ×3 IMPLANT
PACK CARDIAC CATHETERIZATION (CUSTOM PROCEDURE TRAY) ×3 IMPLANT
STENT SYNERGY DES 2.5X12 (Permanent Stent) ×1 IMPLANT
STENT SYNERGY DES 3X38 (Permanent Stent) ×1 IMPLANT
SYR MEDRAD MARK V 150ML (SYRINGE) ×3 IMPLANT
TRANSDUCER W/STOPCOCK (MISCELLANEOUS) ×3 IMPLANT
TUBING CIL FLEX 10 FLL-RA (TUBING) ×3 IMPLANT
VALVE GUARDIAN II ~~LOC~~ HEMO (MISCELLANEOUS) ×1 IMPLANT
WIRE ASAHI PROWATER 180CM (WIRE) ×1 IMPLANT
WIRE HI TORQ BMW 190CM (WIRE) ×1 IMPLANT
WIRE SAFE-T 1.5MM-J .035X260CM (WIRE) ×3 IMPLANT

## 2015-07-11 NOTE — Telephone Encounter (Signed)
Called and spoke with pt and she stated that she was in the hospital and she had 2 stents placed.  She wanted to make Dr. Everlene Farrier aware.  No call back needed to the patient. Will forward message to Dr. Everlene Farrier to make him aware.

## 2015-07-11 NOTE — Telephone Encounter (Signed)
Patient is calling to let Dr. Everlene Farrier that she was hospitalized. She states that there was a blockage and they put two stamps in her. She would also like a call back! [phone]

## 2015-07-11 NOTE — Progress Notes (Signed)
    Left dominant system with severe mid LAD and mid circ doisease.  Both vessels stented with DES.  3.0 x 38 Synergy to LAD. POstdilated to > 3.5 mm  2.5 x 12 Synergy to mid Circ.  POstdilated to >3 mm  Full report to follow.  Jettie Booze, MD

## 2015-07-11 NOTE — Progress Notes (Signed)
TR BAND REMOVAL  LOCATION:    right radial  DEFLATED PER PROTOCOL:    Yes.    TIME BAND OFF / DRESSING APPLIED:    1430   SITE UPON ARRIVAL:    Level 0  SITE AFTER BAND REMOVAL:    Level 0  REVERSE ALLEN'S TEST:     positive  CIRCULATION SENSATION AND MOVEMENT:    Within Normal Limits   Yes.    COMMENTS:   Tolerated procedure well

## 2015-07-11 NOTE — Progress Notes (Signed)
Patient at rest in bed with heart rate 110-120. She denies any pain or complications radial site WNL. I notified PA the plan is to try IV Lopressor PRN and continue to monitor.

## 2015-07-11 NOTE — H&P (View-Only) (Signed)
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  Past Medical History   Past Medical History  Diagnosis Date  . Insomnia   . Nephrolithiasis   . Obesity   . Erythema nodosum   . Aortic insufficiency   . Allergy   . Anemia   . COPD (chronic obstructive pulmonary disease)   . Heart murmur   . Myalgia   . Kidney stones   . Aortic regurgitation   . Pulmonary fibrosis 01/20/2012    Due to previous surgery and chest radiation for lung cancer  . Pneumothorax, spontaneous, tension 01/20/2012    October, 2010  . lung ca dx'd 10/1999    chemo/xrt comp 05/29/2000  . 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 Type II diabetes mellitus   . Pericarditis   . Pericardial effusion     a. 05/2015 Echo: EF 55-60%, no rwma, Gr 1 DD,   . Chest pain     a. 2013 neg MV.   Past Surgical History  Procedure Laterality Date  . Chest surgery      bilateral lung  . Shoulder surgery      right  . Inner ear surgery      right   . Abdominal hysterectomy    . Colonoscopy    . Upper gastrointestinal endoscopy    . Total knee arthroplasty      RIGHT  . Carpal tunnel release      RIGHT in 1998/Left hand in 2009  . Pulmonary embolism surgery  2007    LUNG COLLAPSE  . Esophageal dilation    . Knee operation      LEFT LATERAL RELEASE  . Foot surgery      Callous removed /Bil  . Appendectomy      Allergies  Allergies  Allergen Reactions  . Hydrocodone Hives and Itching  . Morphine And Related Other (See Comments)    GI PROBLEMS  . Nsaids Other (See Comments)    GI  PROBLEMS    HPI  59 year old female with the above complex problem list. She was admitted on August 11 with chest pain. She ruled out. CT was negative for PE but did show mild increased size of chronic pericardial effusion. Echo showed normal LV function. She was placed on colchicine and ibuprofen therapy for presumed pericarditis. She was seen back on August 17 by Dr. [name] and continued to complain of chest pain. Her echo was reviewed and it was felt that the amount of fluid in her pericardial effusion had increased from her echo in March 2016. Pericardium was also felt to be thickened. She was placed on a prednisone taper. Patient says that the prednisone taper helped a little bit with her chest discomfort but not much. She finished that last week and then ran out of colchicine one week ago. She continues to have discomfort when lying down at night that is improved by sitting up. Chest pains also worse with deep breathing. She is chronic dyspnea on exertion. She denies PND, dizziness, syncope, edema, or early satiety.  Home Medications  Prior to  Admission medications   Medication Sig Start Date End Date Taking? Authorizing Provider  albuterol (PROVENTIL HFA;VENTOLIN HFA) 108 (90 BASE) MCG/ACT inhaler Every 12 hours as needed 03/14/15  Yes Rigoberto Noel, MD  aspirin EC 81 MG EC tablet Take 1 tablet (81 mg total) by mouth daily. 06/01/15  Yes Alyssa A Lincoln Brigham, MD  Blood Glucose Monitoring Suppl (ONE TOUCH ULTRA MINI) W/DEVICE KIT 2 (two) times daily. as directed 05/03/15  Yes Historical Provider, MD  HYDROmorphone (DILAUDID) 2 MG tablet Take 2 mg by mouth every 8 (eight) hours as needed. for pain 05/02/15  Yes Historical Provider, MD  insulin aspart protamine- aspart (NOVOLOG MIX 70/30) (70-30) 100 UNIT/ML injection 35 units in the morning and 25 units at night   Yes Historical Provider, MD  ONE TOUCH ULTRA TEST test strip 3 (three) times daily. for testing 05/03/15  Yes Historical Provider, MD    colchicine 0.6 MG tablet Take 1 tablet (0.6 mg total) by mouth 2 (two) times daily. 06/23/15 07/07/15  Rogelia Mire, NP  pantoprazole (PROTONIX) 40 MG tablet Take 1 tablet (40 mg total) by mouth daily. 06/23/15   Rogelia Mire, NP  predniSONE (DELTASONE) 10 MG tablet Take 1 tablet (10 mg total) by mouth daily with breakfast. See taper dose instructions 06/23/15   Rogelia Mire, NP    Review of Systems  As above, she is chronic dyspnea exertion and continues to have pleuritic chest discomfort.  All other systems reviewed and are otherwise negative except as noted above.  Physical Exam  VS:  BP 130/80 mmHg  Pulse 94  Ht _0  (1.702 m)  Wt 245 lb (111.131 kg)  BMI 38.36 kg/m2 , BMI Body mass index is 38.36 kg/(m^2). she does not have a pulsus paradoxus GEN: Well nourished, well developed, in no acute distress. HEENT: normal. Neck: Supple, no JVD, carotid bruits, or masses. Cardiac: RRR, 2/6 systolic murmur loudest at the right upper sternal border the soft rub at the apex. No gallops. No clubbing, cyanosis, edema.  Radials/DP/PT 2+ and equal bilaterally.  Respiratory:  Respirations regular and unlabored, clear to auscultation bilaterally. GI: Soft, nontender, nondistended, BS + x 4. MS: no deformity or atrophy. Skin: warm and dry, no rash. Neuro:  Strength and sensation are intact. Psych: Normal affect.  Assessment & Plan  1.  Acute pericarditis with chronic pericardial effusion: She had some but not significant relief with most recent steroids taper. She continues to have pleuritic chest discomfort as well as chest discomfort while lying in bed at night. I will give her a second go round of steroids taper but start at 60 mg and bring her down over 18 days. I will also resume her colchicine which she ran out of last Friday. As she has been on prolonged steroids/nsaids, i have also Rx protonix 40 qd.  She will continue to watch her sugars closely and we will arrange for  follow-up in approximately 2-3 weeks.  2. Chest pain: Patient was produces scheduled to have stress testing related to her chest pain which is more than likely related to her pericarditis. She never had a stress test because of a foot problem earlier this year. She does have risk factors for coronary artery disease. I will arrange for a Myoview to rule out ischemia and remove this from the picture as a potential cause of her chest pain.   3. Moderate aortic insufficiency: Follow up echo in a year or sooner if necessary.  4. Disposition:  Follow-up in approximately 2-3 weeks.  Murray Hodgkins, NP 06/23/2015, 6:38 PM

## 2015-07-11 NOTE — Telephone Encounter (Signed)
Patient had stents placed. She is doing well.

## 2015-07-11 NOTE — Interval H&P Note (Signed)
Cath Lab Visit (complete for each Cath Lab visit)  Clinical Evaluation Leading to the Procedure:   ACS: No.  Non-ACS:    Anginal Classification: CCS III  Anti-ischemic medical therapy: Minimal Therapy (1 class of medications)  Non-Invasive Test Results: Intermediate-risk stress test findings: cardiac mortality 1-3%/year  Prior CABG: No previous CABG      History and Physical Interval Note:  07/11/2015 7:50 AM  [name]  has presented today for surgery, with the diagnosis of Abnormal Stress Test  The various methods of treatment have been discussed with the patient and family. After consideration of risks, benefits and other options for treatment, the patient has consented to  Procedure(s): Left Heart Cath and Coronary Angiography (N/A) as a surgical intervention .  The patient's history has been reviewed, patient examined, no change in status, stable for surgery.  I have reviewed the patient's chart and labs.  Questions were answered to the patient's satisfaction.     VARANASI,JAYADEEP S.

## 2015-07-12 ENCOUNTER — Other Ambulatory Visit: Payer: Self-pay

## 2015-07-12 ENCOUNTER — Encounter (HOSPITAL_COMMUNITY): Payer: Self-pay | Admitting: Physician Assistant

## 2015-07-12 DIAGNOSIS — J449 Chronic obstructive pulmonary disease, unspecified: Secondary | ICD-10-CM | POA: Diagnosis not present

## 2015-07-12 DIAGNOSIS — R931 Abnormal findings on diagnostic imaging of heart and coronary circulation: Secondary | ICD-10-CM | POA: Diagnosis not present

## 2015-07-12 DIAGNOSIS — Z955 Presence of coronary angioplasty implant and graft: Secondary | ICD-10-CM | POA: Diagnosis not present

## 2015-07-12 DIAGNOSIS — Z85118 Personal history of other malignant neoplasm of bronchus and lung: Secondary | ICD-10-CM | POA: Diagnosis not present

## 2015-07-12 DIAGNOSIS — E119 Type 2 diabetes mellitus without complications: Secondary | ICD-10-CM | POA: Diagnosis not present

## 2015-07-12 DIAGNOSIS — I2510 Atherosclerotic heart disease of native coronary artery without angina pectoris: Secondary | ICD-10-CM | POA: Diagnosis not present

## 2015-07-12 LAB — GLUCOSE, CAPILLARY
Glucose-Capillary: 131 mg/dL — ABNORMAL HIGH (ref 65–99)
Glucose-Capillary: 94 mg/dL (ref 65–99)

## 2015-07-12 LAB — BASIC METABOLIC PANEL
Anion gap: 8 (ref 5–15)
BUN: 14 mg/dL (ref 6–20)
CO2: 31 mmol/L (ref 22–32)
Calcium: 8.8 mg/dL — ABNORMAL LOW (ref 8.9–10.3)
Chloride: 100 mmol/L — ABNORMAL LOW (ref 101–111)
Creatinine, Ser: 0.9 mg/dL (ref 0.44–1.00)
GFR calc Af Amer: >60 mL/min (ref >60)
GFR calc non Af Amer: >60 mL/min (ref >60)
Glucose, Bld: 132 mg/dL — ABNORMAL HIGH (ref 65–99)
Potassium: 3.6 mmol/L (ref 3.5–5.1)
Sodium: 139 mmol/L (ref 135–145)

## 2015-07-12 LAB — CBC
HCT: 37.9 % (ref 36.0–46.0)
Hemoglobin: 12.3 g/dL (ref 12.0–15.0)
MCH: 30.4 pg (ref 26.0–34.0)
MCHC: 32.5 g/dL (ref 30.0–36.0)
MCV: 93.6 fL (ref 78.0–100.0)
Platelets: 243 10*3/uL (ref 150–400)
RBC: 4.05 MIL/uL (ref 3.87–5.11)
RDW: 12.3 % (ref 11.5–15.5)
WBC: 6 10*3/uL (ref 4.0–10.5)

## 2015-07-12 LAB — HEMOGLOBIN A1C
Hgb A1c MFr Bld: 9.1 % — ABNORMAL HIGH (ref 4.8–5.6)
Mean Plasma Glucose: 214 mg/dL

## 2015-07-12 MED ORDER — HYDROCHLOROTHIAZIDE 12.5 MG PO CAPS
12.5000 mg | ORAL_CAPSULE | Freq: Every day | ORAL | Status: DC
  Administered 2015-07-12: 09:00:00 12.5 mg via ORAL
  Filled 2015-07-12: qty 1

## 2015-07-12 MED ORDER — ATORVASTATIN CALCIUM 40 MG PO TABS: 40.0000 mg | ORAL_TABLET | Freq: Every day | ORAL | Status: DC

## 2015-07-12 MED ORDER — HYDROCHLOROTHIAZIDE 12.5 MG PO CAPS: 12.5000 mg | ORAL_CAPSULE | Freq: Every day | ORAL | Status: DC

## 2015-07-12 MED ORDER — LISINOPRIL 5 MG PO TABS
2.5000 mg | ORAL_TABLET | Freq: Every day | ORAL | Status: DC
  Administered 2015-07-12: 2.5 mg via ORAL
  Filled 2015-07-12: qty 1

## 2015-07-12 MED ORDER — LISINOPRIL 2.5 MG PO TABS: 2.5000 mg | ORAL_TABLET | Freq: Every day | ORAL | Status: DC

## 2015-07-12 MED ORDER — CLOPIDOGREL BISULFATE 75 MG PO TABS: 75.0000 mg | ORAL_TABLET | Freq: Every day | ORAL | Status: DC

## 2015-07-12 NOTE — Discharge Instructions (Signed)
No driving for 5. No lifting over 5 lbs for 1 week. No sexual activity for 1 week. You may return to work on 07/18/15. Keep procedure site clean & dry. If you notice increased pain, swelling, bleeding or pus, call/return!  You may shower, but no soaking baths/hot tubs/pools for 1 week.   Continue Colchicine until seen in clinic.

## 2015-07-12 NOTE — Progress Notes (Signed)
CARDIAC REHAB PHASE I   PRE:  Rate/Rhythm: 99 SR  BP:  Supine: 166/69  Sitting:   Standing:    SaO2:   MODE:  Ambulation: 350 ft   POST:  Rate/Rhythm: 112 ST  BP:  Supine:   Sitting: 169/91  Standing:    SaO2:  0810-0910 Pt walked 350 ft with slow steady gait. Some DOE noted. Heart rate at 112. No CP during walk. Education completed with pt who voiced understanding. Reviewed carb counting and gave heart healthy and diabetic diets. Discussed importance of plavix with stents. Pt is in Pulmonary Rehab. Will refer to CRP 2 due to stents. Referral sent to [place].   Graylon Good, RN BSN  07/12/2015 9:04 AM

## 2015-07-12 NOTE — Progress Notes (Signed)
   Patient Name: [name] Date of Encounter: 07/12/2015   SUBJECTIVE  Feeling well. No chest pain or palpitation. Feels heart is racing.   CURRENT MEDS . active partnership for health of your heart book   Does not apply Once  . aspirin  81 mg Oral Daily  . clopidogrel  75 mg Oral Q breakfast  . insulin aspart  0-15 Units Subcutaneous TID WC  . insulin aspart protamine- aspart  25 Units Subcutaneous BID WC  . pantoprazole  40 mg Oral Daily  . potassium chloride  10 mEq Oral Daily  . sodium chloride  3 mL Intravenous Q12H    OBJECTIVE  Filed Vitals:   07/11/15 1938 07/11/15 2035 07/12/15 0520 07/12/15 0521  BP: 194/86 163/89 159/80   Pulse:   78   Temp:    98.1 F (36.7 C)  TempSrc:    Oral  Resp:      Height:      Weight:      SpO2:   93%     Intake/Output Summary (Last 24 hours) at 07/12/15 0727 Last data filed at 07/12/15 0600  Gross per 24 hour  Intake  882.6 ml  Output   3000 ml  Net -2117.4 ml   Filed Weights   07/11/15 0546  Weight: 242 lb (109.77 kg)    PHYSICAL EXAM  General: Pleasant, NAD. Neuro: Alert and oriented X 3. Moves all extremities spontaneously. Psych: Normal affect. HEENT:  Normal  Neck: Supple without bruits or JVD. Lungs:  Resp regular and unlabored, CTA. Heart: regular tachycardia no s3, s4. Systolic murmurs. Abdomen: Soft, non-tender, non-distended, BS + x 4.  Extremities: No clubbing, cyanosis or edema. DP/PT/Radials 2+ and equal bilaterally. Right radial cath site without hematoma or bruit.   Accessory Clinical Findings  CBC  Recent Labs  07/12/15 0352  WBC 6.0  HGB 12.3  HCT 37.9  MCV 93.6  PLT 709   Basic Metabolic Panel  Recent Labs  07/11/15 0635 07/12/15 0352  NA 140 139  K 5.0 3.6  CL 102 100*  CO2 33* 31  GLUCOSE 135* 132*  BUN 17 14  CREATININE 0.89 0.90  CALCIUM 8.8* 8.8*   Hemoglobin A1C  Recent Labs  07/11/15 1900  HGBA1C 9.1*    TELE  Sinus tachy  Radiology/Studies  No  results found.  ASSESSMENT AND PLAN  1. Abnormal nuclear stress test/ Chest pain  - cath showed dominant system with severe mid LAD and mid circ doisease. Both vessels stented with DES. - Continue ASA and plavix - will add lisinopril/ HCTX 2.5/'10mg'$ . --> this will also helps for BP and pulse --> recheck BMET in 1 week. Patient had schedule appointment with Dr. [name] 08/07/15. - She is not on any statin therapy-->Consider adding lipitor -04/18/2015: Cholesterol 184; HDL 50; LDL Cholesterol 104*; Triglycerides 150*; VLDL 30  2. Pericarditis and chronic pericardial effusion - just completed prednisone taper. Continue colchicine until seen in clinic.   Dispo: discharge home later today. Signed, Bhagat,Bhavinkumar PA-C

## 2015-07-12 NOTE — Telephone Encounter (Signed)
Dr. Everlene Farrier is aware of how the patient is doing.

## 2015-07-12 NOTE — Discharge Summary (Signed)
Discharge Summary   Patient ID: [name],  MRN: [id number], DOB/AGE: 1956-10-11 59 y.o.  Admit date: 07/11/2015 Discharge date: 07/12/2015  Primary Care Provider: Nena Jordan A Primary Cardiologist: Dr. [name]  Discharge Diagnoses Active Problems:   Abnormal nuclear stress test   Stented coronary artery   Allergies Allergies  Allergen Reactions  . Hydrocodone Hives and Itching  . Morphine And Related Other (See Comments)    GI PROBLEMS  . Nsaids Other (See Comments)    GI PROBLEMS    Procedures  Conclusion     Mid Cx lesion, 80% stenosed. There is a 0% residual stenosis post intervention with a 2.5 x 12 Synergy drug-eluting stent, postdilated to greater than 3 mm in diameter.  Mid LAD-1 lesion, 75% stenosed. There is a 0% residual stenosis post intervention with a 3.0 x 38 Synergy drug-eluting stent, postdilated to 3.6 mm in diameter.  Successful two-vessel intervention of the LAD and mid circumflex. Drug-eluting stents were placed in both vessels as noted above. The patient should continue with dual antiplatelet therapy for at least a year. She'll need aggressive secondary prevention including diabetes control. She'll be watched overnight. Plan discharge home tomorrow.     Coronary Findings    Dominance: Left   Left Anterior Descending   . Mid LAD-1 lesion, 75% stenosed. The lesion is type C located at the major branch .   [name] PCI: The pre-interventional distal flow is normal (TIMI 3). Pre-stent angioplasty was performed. A drug-eluting stent was placed. The strut is apposed. Post-stent angioplasty was performed. Maximum pressure: 18 atm. The post-interventional distal flow is normal (TIMI 3). The intervention was successful. No complications occurred at this lesion.  . Supplies used: STENT SYNERGY DES [id number]  . There is no residual stenosis post intervention.     . Mid LAD-2 lesion, 80% stenosed.   [name] PCI: The pre-interventional distal flow is normal (TIMI  3). Pre-stent angioplasty was performed. A drug-eluting stent was placed. The strut is apposed. Post-stent angioplasty was performed. Maximum pressure: 18 atm. The post-interventional distal flow is normal (TIMI 3). The intervention was successful. No complications occurred at this lesion.  . Supplies used: STENT SYNERGY DES [id number]  . There is no residual stenosis post intervention.     . Second Diagonal Branch   . Ost 2nd Diag to 2nd Diag lesion, 70% stenosed. The lesion is type C located at the major branch .     Ramus Intermedius  The vessel is small .     Left Circumflex   . Mid Cx lesion, 80% stenosed. discrete .   [name] PCI: The pre-interventional distal flow is normal (TIMI 3). Pre-stent angioplasty was performed. A drug-eluting stent was placed. The strut is apposed. Post-stent angioplasty was performed. Maximum pressure: 18 atm. The post-interventional distal flow is normal (TIMI 3). The intervention was successful. No complications occurred at this lesion.  . Supplies used: STENT SYNERGY DES 2.5X12; BALLOON Higginson EMERGE MR 3.0X8  . There is no residual stenosis post intervention.        Left Heart    Aortic Valve There is no aortic valve stenosis    History of Present Illness  59 year old female with hx of of non-small cell lung cancer, pulmonary fibrosis, aortic insufficiency, COPD and anemia.  She was admitted on August 11 with chest pain. She ruled out. CT was negative for PE but did show mild increased size of chronic pericardial effusion. Echo showed normal LV function. She was placed on  colchicine and ibuprofen therapy for presumed pericarditis. She was seen back on August 17 by Dr. [name] and continued to complain of chest pain. Her echo was reviewed and it was felt that the amount of fluid in her pericardial effusion had increased from her echo in March 2016. Pericardium was also felt to be thickened. She was placed on a prednisone taper. Patient says that the prednisone taper  helped a little bit with her chest discomfort but not much. Again seen in clinic 06/23/15- she continued to have discomfort when lying down at night that is improved by sitting up. Chest pains also worse with deep breathing. She has chronic dyspnea on exertion. She was given another steroids taper dose for 18 days and resumed colchicine. F/u Myocardial perfusion was abnormal. Findings consistent with ischemia. This is an intermediate risk.   Hospital Course  The patient was presented 07/11/15 for scheduled cath which showed dominant system with severe mid LAD and mid circ disease. Both vessels stented with DES.Over night patient denied any chest pain. The patient was started on amlodipine/ HCTZ due to high BP and pulse. BB not felt to be good choice due to COPD and lung cancer. BP improved on medications. Lipitor was also started on this admission for CV preventions.   04/18/2015: Cholesterol 184; HDL 50; LDL Cholesterol 104*; Triglycerides 150*; VLDL 30    She has been seen by Dr. Tamala Julian and deemed ready for discharge home. All follow-up appointments have been scheduled. Discharge medications are listed below.   She will need BMET during outpatient visit given lisinopril initiation this admission. HgbA1c was 9.1, was on SSI in hospital. Patient attributed this due to recent steroid taper x 2. She will f/u with PCP. She will continue colchicine until seen in clinic.   Discharge Vitals Blood pressure 144/86, pulse 91, temperature 98.2 F (36.8 C), temperature source Oral, resp. rate 18, height _0  (1.702 m), weight 242 lb (109.77 kg), SpO2 94 %.  Filed Weights   07/11/15 0546  Weight: 242 lb (109.77 kg)    Labs  CBC  Recent Labs  07/12/15 0352  WBC 6.0  HGB 12.3  HCT 37.9  MCV 93.6  PLT 712   Basic Metabolic Panel  Recent Labs  07/11/15 0635 07/12/15 0352  NA 140 139  K 5.0 3.6  CL 102 100*  CO2 33* 31  GLUCOSE 135* 132*  BUN 17 14  CREATININE 0.89 0.90  CALCIUM 8.8* 8.8*       Recent Labs  07/11/15 1900  HGBA1C 9.1*    Disposition  Pt is being discharged home today in good condition.  Follow-up Plans & Appointments  Follow-up Information    Follow up with Melina Copa, PA-C. Go on 07/25/2015.   Specialties:  Cardiology, Radiology   Why:  _1 :30 for cardiology follow up   Contact information:   [address] [place] [address] [phone]       Follow up with Nena Jordan A, MD. Schedule an appointment as soon as possible for a visit in 1 week.   Specialty:  Family Medicine   Why:  for high HgbA1c 9.1   Contact information:   [place] [address] [phone]           Discharge Instructions    Amb Referral to Cardiac Rehabilitation    Complete by:  As directed   Congestive Heart Failure: If diagnosis is Heart Failure, patient MUST meet each of the CMS criteria:  1. Left Ventricular Ejection Fraction </= 35% 2. NYHA class II-IV symptoms despite being on optimal heart failure therapy for at least 6 weeks. 3. Stable = have not had a recent (<6 weeks) or planned (<6 months) major cardiovascular hospitalization or procedure  Program Details: - Physician supervised classes - 1-3 classes per week over a 12-18 week period, generally for a total of 36 sessions  Physician Certification: I certify that the above Cardiac Rehabilitation treatment is medically necessary and is medically approved by me for treatment of this patient. The patient is willing and cooperative, able to ambulate and medically stable to participate in exercise rehabilitation. The participant's progress and Individualized Treatment Plan will be reviewed by the Medical Director, Cardiac Rehab staff and as indicated by the Referring/Ordering Physician.  Diagnosis:  PCI     Call MD for:  redness, tenderness, or signs of infection (pain, swelling, redness, odor or green/yellow discharge around incision site)    Complete by:  As directed       Diet - low sodium heart healthy    Complete by:  As directed      Discharge instructions    Complete by:  As directed      Increase activity slowly    Complete by:  As directed            F/u Labs/Studies: Bmet during hospital follow up and consider OP f/u labs 6-8 weeks given statin initiation this admission.  Discharge Medications    Medication List    STOP taking these medications        ibuprofen 600 MG tablet  Commonly known as:  ADVIL,MOTRIN      TAKE these medications        albuterol 108 (90 BASE) MCG/ACT inhaler  Commonly known as:  PROVENTIL HFA;VENTOLIN HFA  Every 12 hours as needed     aspirin 81 MG EC tablet  Take 1 tablet (81 mg total) by mouth daily.     atorvastatin 40 MG tablet  Commonly known as:  LIPITOR  Take 1 tablet (40 mg total) by mouth daily.     clopidogrel 75 MG tablet  Commonly known as:  PLAVIX  Take 1 tablet (75 mg total) by mouth daily with breakfast.     hydrochlorothiazide 12.5 MG capsule  Commonly known as:  MICROZIDE  Take 1 capsule (12.5 mg total) by mouth daily.     HYDROmorphone 2 MG tablet  Commonly known as:  DILAUDID  Take 2 mg by mouth every 8 (eight) hours as needed. for pain     lisinopril 2.5 MG tablet  Commonly known as:  PRINIVIL,ZESTRIL  Take 1 tablet (2.5 mg total) by mouth daily.     NOVOLOG MIX 70/30 (70-30) 100 UNIT/ML injection  Generic drug:  insulin aspart protamine- aspart  35 units in the morning and 25 units at night     ONE TOUCH ULTRA MINI W/DEVICE Kit  2 (two) times daily. as directed     ONE TOUCH ULTRA TEST test strip  Generic drug:  glucose blood  3 (three) times daily. for testing     pantoprazole 40 MG tablet  Commonly known as:  PROTONIX  Take 1 tablet (40 mg total) by mouth daily.     potassium chloride 10 MEQ tablet  Commonly known as:  K-DUR,KLOR-CON  Take 1 tablet (10 mEq total) by mouth daily.        Duration of Discharge Encounter   Greater than 30 minutes including  physician time.  Signed, Bhagat,Bhavinkumar PA-C 07/12/2015, 2:35 PM

## 2015-07-13 ENCOUNTER — Encounter (HOSPITAL_COMMUNITY): Payer: Medicare Other

## 2015-07-14 ENCOUNTER — Encounter: Payer: Medicare Other | Admitting: Gastroenterology

## 2015-07-18 ENCOUNTER — Encounter (HOSPITAL_COMMUNITY): Payer: Medicare Other

## 2015-07-20 ENCOUNTER — Encounter (HOSPITAL_COMMUNITY): Payer: Medicare Other

## 2015-07-24 ENCOUNTER — Ambulatory Visit: Payer: Medicare Other | Admitting: Internal Medicine

## 2015-07-25 ENCOUNTER — Encounter (HOSPITAL_COMMUNITY): Payer: Medicare Other

## 2015-07-25 ENCOUNTER — Encounter: Payer: Self-pay | Admitting: Physician Assistant

## 2015-07-25 ENCOUNTER — Encounter: Payer: Self-pay | Admitting: Emergency Medicine

## 2015-07-25 ENCOUNTER — Ambulatory Visit (INDEPENDENT_AMBULATORY_CARE_PROVIDER_SITE_OTHER): Payer: Medicare Other | Admitting: Physician Assistant

## 2015-07-25 VITALS — BP 122/68 | HR 101 | Ht 67.0 in | Wt 245.4 lb

## 2015-07-25 DIAGNOSIS — I2510 Atherosclerotic heart disease of native coronary artery without angina pectoris: Secondary | ICD-10-CM

## 2015-07-25 DIAGNOSIS — Z9861 Coronary angioplasty status: Secondary | ICD-10-CM | POA: Diagnosis not present

## 2015-07-25 DIAGNOSIS — I10 Essential (primary) hypertension: Secondary | ICD-10-CM | POA: Diagnosis not present

## 2015-07-25 DIAGNOSIS — I351 Nonrheumatic aortic (valve) insufficiency: Secondary | ICD-10-CM

## 2015-07-25 DIAGNOSIS — I319 Disease of pericardium, unspecified: Secondary | ICD-10-CM

## 2015-07-25 DIAGNOSIS — I3139 Other pericardial effusion (noninflammatory): Secondary | ICD-10-CM

## 2015-07-25 DIAGNOSIS — I313 Pericardial effusion (noninflammatory): Secondary | ICD-10-CM

## 2015-07-25 LAB — BASIC METABOLIC PANEL
BUN: 22 mg/dL (ref 7–25)
CO2: 28 mmol/L (ref 20–31)
Calcium: 9.8 mg/dL (ref 8.6–10.4)
Chloride: 105 mmol/L (ref 98–110)
Creat: 1 mg/dL (ref 0.50–1.05)
Glucose, Bld: 142 mg/dL — ABNORMAL HIGH (ref 65–99)
Potassium: 4.5 mmol/L (ref 3.5–5.3)
Sodium: 141 mmol/L (ref 135–146)

## 2015-07-25 MED ORDER — COLCHICINE 0.6 MG PO TABS: 0.6000 mg | ORAL_TABLET | Freq: Two times a day (BID) | ORAL | Status: DC

## 2015-07-25 MED ORDER — LOSARTAN POTASSIUM 25 MG PO TABS: 25.0000 mg | ORAL_TABLET | Freq: Every day | ORAL | Status: DC

## 2015-07-25 NOTE — Progress Notes (Signed)
Cardiology Office Note Date:  07/25/2015  Patient ID:  [name], [name] [birth year], MRN [id number] PCP:  Jenny Reichmann, MD  Cardiologist:  Dr. [name]   Chief Complaint: f/u cath  History of Present Illness: [name] is a 59 y.o. female with history of CAD s/p DES to [facility] and mLAD, chronic pericardial effusion, DM, non-small cell lung cancer, pulmonary fibrosis, aortic insufficiency, COPD, anemia, mod AI by echo 05/2015 who presents for post-hospital follow-up.  To recap history, she was admitted in August 2016 with chest pain. She ruled out. CT was negative for PE but did show mild increased size of chronic pericardial effusion (small-moderate). Echo 05/31/15: EF 55-60%, no RWMA, grade 1 DD, mod AI, no pericardial effusion per report. She was placed on colchicine and ibuprofen therapy for presumed pericarditis. She was seen back on 06/07/15 by Dr. [name] at which time she continued to complain of chest pain. Dr. [name] reviewed her CTAs who did agree the amount of pericardial fluid had increased but more importantly the pericardium that had been thickened just locally was now significantly thickened circumferentially. The patient was placed on a prednisone taper. She was seen in clinic 06/23/15 and continued to have discomfort with deep breathing as well as when lying down at night that was improved by sitting up. She was given another steroid taper. The patient had also run out of colchicine and this was resumed. She underwent nuclear stress testing which was abnormal, thus LHC was arranged. She underwent this on 07/11/15 with resultant DES to [facility] and DES to mLAD; otherwise 70% ostial 2nd diag lesion.  DAPT for at least a year was recommended. she was started on lisinopril/HCTZ for elevated blood pressure. BB not felt to be good choice due to COPD and lung cancer. She was also started on Lipitor for CV disease. A1C was 9.1 and she was recommended to f/u PCP. Recent Hgb normal.   The patient comes in  today for follow-up. She says she is feeling so much better. She has not had any recurrent chest pain except for very slight discomfort occasionally when she yawns. She happily proclaims, "I can breathe!" She does note cough since starting new meds of lisinopril/HCTZ. She says she thinks her BP was running high in the hospital because she was on prednisone. She says her BPs have been normal since discharge on the low dose ACEI/HCTZ.   Past Medical History  Diagnosis Date  . Insomnia   . Obesity   . Erythema nodosum   . Aortic insufficiency     a. Mod by echo 05/2015.  [name] Allergy   . Anemia   . Myalgia   . Pulmonary fibrosis ([place]) 01/20/2012    Due to previous surgery and chest radiation for lung cancer  . Pneumothorax, spontaneous, tension 01/20/2012    October, 2010  . lung ca dx'd 10/1999    chemo/xrt comp 05/29/2000  . Carcinoma of hilus of lung ([place]) 01/20/2012    IIIB NSCL  Right hilar mass compressing esophagus/presenting with dysphagia Rx Surgery/RT/chemo dx January 2001  . Type II diabetes mellitus ([place])   . Pericarditis     a. Dx 05/2015.  [name] Pericardial effusion     a. 05/2015 Echo: EF 55-60%, no rwma, Gr 1 DD, no effusion - but an effusion was seen on CT which was felt to be increased in size compared to 12/2014 - pericardium also thickened.   . Nephrolithiasis   . Complication of anesthesia     "  I had a hard time waking me up w/one of my shoulder surgeries"  . Coronary artery disease     a. Abnormal stress test -> LHc 06/2015 s/p DES to mCX and mLAD, residual D2 disease treated medially.  [name] Restrictive lung disease   . Arthritis     "knees" (07/11/2015)  . Anemia   . Essential hypertension     Past Surgical History  Procedure Laterality Date  . Lung cancer surgery Left 2001    "small cell"  . Shoulder arthroscopy w/ rotator cuff repair Right ~ 2003  . Inner ear surgery Right ~ 2009  . Abdominal hysterectomy  2008  . Colonoscopy    . Knee arthroplasty Right 1991  . Carpal  tunnel release Bilateral 1998-2005?    right-left  . Pulmonary embolism surgery  2007    LUNG COLLAPSE  . Esophagogastroduodenoscopy (egd) with esophageal dilation  2012  . Knee arthroscopy  ~ 2000    LATERAL RELEASE  . Foot surgery Bilateral 1980's    Callous removed   . Cardiac catheterization N/A 07/11/2015    Procedure: Left Heart Cath and Coronary Angiography;  Surgeon: Jettie Booze, MD;  Location: [place] [facility];  Service: Cardiovascular;  Laterality: N/A;  . Cardiac catheterization  07/11/2015    Procedure: Coronary Stent Intervention;  Surgeon: Jettie Booze, MD;  Location: [place] [facility];  Service: Cardiovascular;;  . Appendectomy  1969?  [name] Shoulder adhesion release Right ~ 2004  . Lung surgery Right 2007    "reinflated it"  . Cystoscopy w/ stone manipulation  2013  . Carpal tunnel release Left 2009?    "for the 2nd time"  . Coronary angioplasty      Current Outpatient Prescriptions  Medication Sig Dispense Refill  . albuterol (PROVENTIL HFA;VENTOLIN HFA) 108 (90 BASE) MCG/ACT inhaler Inhale 1 puff into the lungs every 12 (twelve) hours as needed for wheezing or shortness of breath.     [name] aspirin EC 81 MG EC tablet Take 1 tablet (81 mg total) by mouth daily. 30 tablet 5  . atorvastatin (LIPITOR) 40 MG tablet Take 1 tablet (40 mg total) by mouth daily. 30 tablet 6  . Blood Glucose Monitoring Suppl (ONE TOUCH ULTRA MINI) W/DEVICE KIT 2 (two) times daily. as directed  3  . clopidogrel (PLAVIX) 75 MG tablet Take 1 tablet (75 mg total) by mouth daily with breakfast. 30 tablet 6  . HYDROmorphone (DILAUDID) 2 MG tablet Take 2 mg by mouth every 8 (eight) hours as needed. for pain  0  . insulin aspart protamine- aspart (NOVOLOG MIX 70/30) (70-30) 100 UNIT/ML injection Inject into the skin. 35 units in the morning and 25 units at night    . ONE TOUCH ULTRA TEST test strip 3 (three) times daily. for testing as directed  12  . pantoprazole (PROTONIX) 40 MG tablet Take  1 tablet (40 mg total) by mouth daily. 30 tablet 3  . potassium chloride (K-DUR,KLOR-CON) 10 MEQ tablet Take 1 tablet (10 mEq total) by mouth daily. 90 tablet 3  . ibuprofen (ADVIL,MOTRIN) 600 MG tablet Take 600 mg by mouth daily.  3   No current facility-administered medications for this visit.    Allergies:   Hydrocodone; Morphine and related; and Nsaids   Social History:  The patient  reports that she quit smoking about 16 years ago. Her smoking use included Cigarettes. She has a 1.25 pack-year smoking history. She has never used smokeless tobacco. She reports that she  does not drink alcohol or use illicit drugs.   Family History:  The patient's family history includes Diabetes in her father, maternal grandmother, mother, paternal grandmother, and sister; Heart disease in her father; Hypertension in her sister; Pancreatic cancer in her mother. There is no history of Colon cancer, Colon polyps, Rectal cancer, Stomach cancer, or Esophageal cancer.  ROS:  Please see the history of present illness. All other systems are reviewed and otherwise negative.   PHYSICAL EXAM:  VS:  BP 122/68 mmHg  Pulse 101  Ht '5\' 7"'  (1.702 m)  Wt 245 lb 6.4 oz (111.313 kg)  BMI 38.43 kg/m2 BMI: Body mass index is 38.43 kg/(m^2).  Pulse recheck is mid-90s. Well nourished, well developed AAF, in no acute distress HEENT: normocephalic, atraumatic Neck: no JVD, carotid bruits or masses Cardiac:  normal S1, S2; RRR; soft systolic aortic murmur, no rubs or gallops Lungs:  clear to auscultation bilaterally, no wheezing, rhonchi or rales Abd: soft, nontender, no hepatomegaly, + BS MS: no deformity or atrophy Ext: no edema; right radial cath site without ecchymosis or hematoma; faint radial pulse noted Skin: warm and dry, no rash Neuro:  moves all extremities spontaneously, no focal abnormalities noted, follows commands Psych: euthymic mood, full affect   EKG:  Done today shows sinus tachycardia 101bpm, otherwise  no acute St-T changes  Recent Labs: 04/18/2015: TSH 2.133 05/30/2015: B Natriuretic Peptide 35.0 07/07/2015: ALT 16 07/12/2015: BUN 14; Creatinine, Ser 0.90; Hemoglobin 12.3; Platelets 243; Potassium 3.6; Sodium 139  04/18/2015: Cholesterol 184; HDL 50; LDL Cholesterol 104*; Total CHOL/HDL Ratio 3.7; Triglycerides 150*; VLDL 30   Estimated Creatinine Clearance: 88.7 mL/min (by C-G formula based on Cr of 0.9).   Wt Readings from Last 3 Encounters:  07/25/15 245 lb 6.4 oz (111.313 kg)  07/11/15 242 lb (109.77 kg)  07/04/15 252 lb 3.2 oz (114.397 kg)     Other studies reviewed: Additional studies/records reviewed today include: summarized above  ASSESSMENT AND PLAN:  1. CAD s/p recent PCI as above - feeling much better. Continue DAPT. HR is in the 90s-100s in clinic today. Per review of recent hospitalization, HR was 99-112 at time of discharge so this is stable. She is not on BB as above due to history of COPD and lung cancer. F/u LFTs/lipids later this month given recent statin initiation. 2. Elevated blood pressure - patient believes this is related to recent prednisone use. She reports BPs have been normal off prednisone since discharge. She is now on very low dose lisinopril/HCTZ and feels this is making her cough. Will stop both and start losartan 80m daily. This will provide nephroprotection given DM. Will f/u BMET today. She had a pre-existing lab appointment scheduled for 10/14 for a BMET - will use this time to repeat BMET since we are changing to losartan, along with f/u liver/LFTs on statin. 3. Aortic insufficiency - will need to follow with serial echocardiograms at the discretion of primary cardiologist. 4. Pericarditis with pericardial effusion - she is now off prednisone taper and ibuprofen and doing well. I asked her to continue colchicine until further instructed by Dr. NMeda Coffeeto prevent recurrence. (This was started 05/2015.)  Disposition: F/u with Dr. NMeda Coffeeas scheduled later  this month - patient would like to keep this appt. She also plans to f/u PCP for her recent A1C 9.1.  Current medicines are reviewed at length with the patient today.  The patient did not have any concerns regarding medicines.  Signed, DMelina CopaPA-C 07/25/2015  4:17 PM     [facility] [address] [place] [address] [phone] (office)  [phone] (fax)

## 2015-07-25 NOTE — Patient Instructions (Addendum)
Medication Instructions:   START TAKING  LOSARTAN 25 MG ONCE A DAY   START BACK TAKING COLCHICINE 0.6 MG  TWICE A DAY   STOP TAKING LISINOPRIL AND HCTZ    Labwork:  BMET TODAY   COME BACK IN A COUPLE OF WEEKS FASTING FOR LFT LIPIDS AND BMET    Testing/Procedures:  NONE ORDER TODAY   Follow-Up:  KEEP APPT WITH DR [name] AS SCHEDULED  Any Other Special Instructions Will Be Listed Below (If Applicable).

## 2015-07-27 ENCOUNTER — Other Ambulatory Visit: Payer: Self-pay

## 2015-07-27 ENCOUNTER — Encounter (HOSPITAL_COMMUNITY): Payer: Medicare Other

## 2015-07-27 DIAGNOSIS — Z1231 Encounter for screening mammogram for malignant neoplasm of breast: Secondary | ICD-10-CM

## 2015-07-28 ENCOUNTER — Telehealth: Payer: Self-pay

## 2015-07-28 NOTE — Telephone Encounter (Signed)
Called patient about lab results.  Per Melina Copa PA, Please let patient know BMET stable. K is mid 4's so she is OK to stop the low dose potassium she is on for now. Keep f/u labs as planned - coming back 10/14. Patient verbalized understanding.

## 2015-08-01 ENCOUNTER — Encounter (HOSPITAL_COMMUNITY): Payer: Medicare Other

## 2015-08-03 ENCOUNTER — Encounter (HOSPITAL_COMMUNITY): Payer: Medicare Other

## 2015-08-04 ENCOUNTER — Other Ambulatory Visit (INDEPENDENT_AMBULATORY_CARE_PROVIDER_SITE_OTHER): Payer: Medicare Other | Admitting: *Deleted

## 2015-08-04 DIAGNOSIS — E876 Hypokalemia: Secondary | ICD-10-CM | POA: Diagnosis not present

## 2015-08-04 DIAGNOSIS — I10 Essential (primary) hypertension: Secondary | ICD-10-CM

## 2015-08-04 LAB — BASIC METABOLIC PANEL
BUN: 18 mg/dL (ref 7–25)
CO2: 26 mmol/L (ref 20–31)
Calcium: 9.2 mg/dL (ref 8.6–10.4)
Chloride: 103 mmol/L (ref 98–110)
Creat: 0.9 mg/dL (ref 0.50–1.05)
Glucose, Bld: 133 mg/dL — ABNORMAL HIGH (ref 65–99)
Potassium: 3.7 mmol/L (ref 3.5–5.3)
Sodium: 139 mmol/L (ref 135–146)

## 2015-08-04 LAB — HEPATIC FUNCTION PANEL
ALT: 22 U/L (ref 6–29)
AST: 21 U/L (ref 10–35)
Albumin: 3.7 g/dL (ref 3.6–5.1)
Alkaline Phosphatase: 103 U/L (ref 33–130)
Bilirubin, Direct: <0.1 mg/dL (ref <=0.2)
Total Bilirubin: 0.5 mg/dL (ref 0.2–1.2)
Total Protein: 6.7 g/dL (ref 6.1–8.1)

## 2015-08-04 LAB — LIPID PANEL
Cholesterol: 104 mg/dL — ABNORMAL LOW (ref 125–200)
HDL: 47 mg/dL (ref >=46)
LDL Cholesterol: 42 mg/dL (ref <130)
Total CHOL/HDL Ratio: 2.2 Ratio (ref <=5.0)
Triglycerides: 74 mg/dL (ref <150)
VLDL: 15 mg/dL (ref <30)

## 2015-08-04 NOTE — Addendum Note (Signed)
Addended by: Eulis Foster on: 08/04/2015 11:26 AM   Modules accepted: Orders

## 2015-08-04 NOTE — Addendum Note (Signed)
Addended by: Eulis Foster on: 08/04/2015 11:27 AM   Modules accepted: Orders

## 2015-08-07 ENCOUNTER — Encounter: Payer: Self-pay | Admitting: Cardiology

## 2015-08-07 ENCOUNTER — Ambulatory Visit (INDEPENDENT_AMBULATORY_CARE_PROVIDER_SITE_OTHER): Payer: Medicare Other | Admitting: Cardiology

## 2015-08-07 VITALS — BP 124/60 | HR 89 | Ht 67.0 in | Wt 247.0 lb

## 2015-08-07 DIAGNOSIS — E785 Hyperlipidemia, unspecified: Secondary | ICD-10-CM

## 2015-08-07 DIAGNOSIS — I2510 Atherosclerotic heart disease of native coronary artery without angina pectoris: Secondary | ICD-10-CM | POA: Diagnosis not present

## 2015-08-07 DIAGNOSIS — I2583 Coronary atherosclerosis due to lipid rich plaque: Secondary | ICD-10-CM

## 2015-08-07 DIAGNOSIS — I351 Nonrheumatic aortic (valve) insufficiency: Secondary | ICD-10-CM

## 2015-08-07 DIAGNOSIS — I10 Essential (primary) hypertension: Secondary | ICD-10-CM | POA: Diagnosis not present

## 2015-08-07 MED ORDER — ROSUVASTATIN CALCIUM 10 MG PO TABS: 10.0000 mg | ORAL_TABLET | Freq: Every day | ORAL | Status: DC

## 2015-08-07 NOTE — Progress Notes (Signed)
Patient ID: KYONNA FRIER, female   DOB: 19-Sep-1956, 59 y.o.   MRN: [id number]      Cardiology Office Note Date:  08/07/2015  Patient ID:  Charlie, Char February 12, 1956, MRN [id number] PCP:  Jenny Reichmann, MD  Cardiologist:  Dr. [name]   Chief Complaint: f/u cath  History of Present Illness: NAIOMI MUSTO is a 59 y.o. female with history of CAD s/p DES to [facility] and mLAD, chronic pericardial effusion, DM, non-small cell lung cancer, pulmonary fibrosis, aortic insufficiency, COPD, anemia, mod AI by echo 05/2015 who presents for post-hospital follow-up.  To recap history, she was admitted in August 2016 with chest pain. She ruled out. CT was negative for PE but did show mild increased size of chronic pericardial effusion (small-moderate). Echo 05/31/15: EF 55-60%, no RWMA, grade 1 DD, mod AI, no pericardial effusion per report. She was placed on colchicine and ibuprofen therapy for presumed pericarditis. She was seen back on 06/07/15 by Dr. [name] at which time she continued to complain of chest pain. Dr. [name] reviewed her CTAs who did agree the amount of pericardial fluid had increased but more importantly the pericardium that had been thickened just locally was now significantly thickened circumferentially. The patient was placed on a prednisone taper. She was seen in clinic 06/23/15 and continued to have discomfort with deep breathing as well as when lying down at night that was improved by sitting up. She was given another steroid taper. The patient had also run out of colchicine and this was resumed. She underwent nuclear stress testing which was abnormal, thus LHC was arranged. She underwent this on 07/11/15 with resultant DES to [facility] and DES to mLAD; otherwise 70% ostial 2nd diag lesion.  DAPT for at least a year was recommended. she was started on lisinopril/HCTZ for elevated blood pressure. BB not felt to be good choice due to COPD and lung cancer. She was also started on Lipitor for CV disease. A1C was  9.1 and she was recommended to f/u PCP. Recent Hgb normal.   The patient comes in today for follow-up. She says she is feeling so much better. She has not had any recurrent chest pain except for very slight discomfort occasionally when she yawns. She happily proclaims, "I can breathe!" She does note cough since starting new meds of lisinopril/HCTZ. She says she thinks her BP was running high in the hospital because she was on prednisone. She says her BPs have been normal since discharge on the low dose ACEI/HCTZ.  08/07/2015 - 2 months follow up, she feels significantly better, she is about to start cardiac rehab. No CP or SOB, but has muscle cramping and pain that wakes her up at night. Complaint to her meds.    Past Medical History  Diagnosis Date  . Insomnia   . Obesity   . Erythema nodosum   . Aortic insufficiency     a. Mod by echo 05/2015.  [name] Allergy   . Anemia   . Myalgia   . Pulmonary fibrosis ([place]) 01/20/2012    Due to previous surgery and chest radiation for lung cancer  . Pneumothorax, spontaneous, tension 01/20/2012    October, 2010  . lung ca dx'd 10/1999    chemo/xrt comp 05/29/2000  . Carcinoma of hilus of lung ([place]) 01/20/2012    IIIB NSCL  Right hilar mass compressing esophagus/presenting with dysphagia Rx Surgery/RT/chemo dx January 2001  . Type II diabetes mellitus ([place])   . Pericarditis  a. Dx 05/2015.  [name] Pericardial effusion     a. 05/2015 Echo: EF 55-60%, no rwma, Gr 1 DD, no effusion - but an effusion was seen on CT which was felt to be increased in size compared to 12/2014 - pericardium also thickened.   . Nephrolithiasis   . Complication of anesthesia     "I had a hard time waking me up w/one of my shoulder surgeries"  . Coronary artery disease     a. Abnormal stress test -> LHc 06/2015 s/p DES to mCX and mLAD, residual D2 disease treated medially.  [name] Restrictive lung disease   . Arthritis     "knees" (07/11/2015)  . Anemia   . Elevated blood pressure     Past  Surgical History  Procedure Laterality Date  . Lung cancer surgery Left 2001    "small cell"  . Shoulder arthroscopy w/ rotator cuff repair Right ~ 2003  . Inner ear surgery Right ~ 2009  . Abdominal hysterectomy  2008  . Colonoscopy    . Knee arthroplasty Right 1991  . Carpal tunnel release Bilateral 1998-2005?    right-left  . Pulmonary embolism surgery  2007    LUNG COLLAPSE  . Esophagogastroduodenoscopy (egd) with esophageal dilation  2012  . Knee arthroscopy  ~ 2000    LATERAL RELEASE  . Foot surgery Bilateral 1980's    Callous removed   . Cardiac catheterization N/A 07/11/2015    Procedure: Left Heart Cath and Coronary Angiography;  Surgeon: Jettie Booze, MD;  Location: [place] [facility];  Service: Cardiovascular;  Laterality: N/A;  . Cardiac catheterization  07/11/2015    Procedure: Coronary Stent Intervention;  Surgeon: Jettie Booze, MD;  Location: [place] [facility];  Service: Cardiovascular;;  . Appendectomy  1969?  [name] Shoulder adhesion release Right ~ 2004  . Lung surgery Right 2007    "reinflated it"  . Cystoscopy w/ stone manipulation  2013  . Carpal tunnel release Left 2009?    "for the 2nd time"  . Coronary angioplasty      Current Outpatient Prescriptions  Medication Sig Dispense Refill  . albuterol (PROVENTIL HFA;VENTOLIN HFA) 108 (90 BASE) MCG/ACT inhaler Inhale 1 puff into the lungs every 12 (twelve) hours as needed for wheezing or shortness of breath.     [name] aspirin EC 81 MG EC tablet Take 1 tablet (81 mg total) by mouth daily. 30 tablet 5  . atorvastatin (LIPITOR) 40 MG tablet Take 1 tablet (40 mg total) by mouth daily. 30 tablet 6  . Blood Glucose Monitoring Suppl (ONE TOUCH ULTRA MINI) W/DEVICE KIT 2 (two) times daily. as directed  3  . clopidogrel (PLAVIX) 75 MG tablet Take 1 tablet (75 mg total) by mouth daily with breakfast. 30 tablet 6  . colchicine 0.6 MG tablet Take 1 tablet (0.6 mg total) by mouth 2 (two) times daily.    [name] HYDROmorphone  (DILAUDID) 2 MG tablet Take 2 mg by mouth every 8 (eight) hours as needed. for pain  0  . ibuprofen (ADVIL,MOTRIN) 600 MG tablet Take 600 mg by mouth daily.  3  . insulin aspart protamine- aspart (NOVOLOG MIX 70/30) (70-30) 100 UNIT/ML injection Inject into the skin. 35 units in the morning and 25 units at night    . losartan (COZAAR) 25 MG tablet Take 1 tablet (25 mg total) by mouth daily. 30 tablet 6  . ONE TOUCH ULTRA TEST test strip 3 (three) times daily. for testing as directed  12  . pantoprazole (PROTONIX) 40 MG tablet Take 1 tablet (40 mg total) by mouth daily. 30 tablet 3   No current facility-administered medications for this visit.    Allergies:   Hydrocodone; Morphine and related; Lisinopril; and Nsaids   Social History:  The patient  reports that she quit smoking about 16 years ago. Her smoking use included Cigarettes. She has a 1.25 pack-year smoking history. She has never used smokeless tobacco. She reports that she does not drink alcohol or use illicit drugs.   Family History:  The patient's family history includes Diabetes in her father, maternal grandmother, mother, paternal grandmother, and sister; Heart disease in her father; Hypertension in her sister; Pancreatic cancer in her mother. There is no history of Colon cancer, Colon polyps, Rectal cancer, Stomach cancer, or Esophageal cancer.  ROS:  Please see the history of present illness. All other systems are reviewed and otherwise negative.   PHYSICAL EXAM:  VS:  BP 124/60 mmHg  Pulse 89  Ht '5\' 7"'  (1.702 m)  Wt 247 lb (112.038 kg)  BMI 38.68 kg/m2  SpO2 97% BMI: Body mass index is 38.68 kg/(m^2).  Pulse recheck is mid-90s. Well nourished, well developed AAF, in no acute distress HEENT: normocephalic, atraumatic Neck: no JVD, carotid bruits or masses Cardiac:  normal S1, S2; RRR; harsh 4/6 diastolic aortic murmur, no rubs or gallops Lungs:  clear to auscultation bilaterally, no wheezing, rhonchi or rales Abd: soft,  nontender, no hepatomegaly, + BS MS: no deformity or atrophy Ext: no edema; right radial cath site without ecchymosis or hematoma; faint radial pulse noted Skin: warm and dry, no rash Neuro:  moves all extremities spontaneously, no focal abnormalities noted, follows commands Psych: euthymic mood, full affect   EKG:  Done today shows sinus tachycardia 101bpm, otherwise no acute St-T changes  Recent Labs: 04/18/2015: TSH 2.133 05/30/2015: B Natriuretic Peptide 35.0 07/12/2015: Hemoglobin 12.3; Platelets 243 08/04/2015: ALT 22; BUN 18; Creat 0.90; Potassium 3.7; Sodium 139  08/04/2015: Cholesterol 104*; HDL 47; LDL Cholesterol 42; Total CHOL/HDL Ratio 2.2; Triglycerides 74; VLDL 15   Estimated Creatinine Clearance: 89.1 mL/min (by C-G formula based on Cr of 0.9).   Wt Readings from Last 3 Encounters:  08/07/15 247 lb (112.038 kg)  07/25/15 245 lb 6.4 oz (111.313 kg)  07/11/15 242 lb (109.77 kg)     Other studies reviewed: Additional studies/records reviewed today include: summarized above    ASSESSMENT AND PLAN:  1. CAD s/p recent PCI as above - feeling much better. Continue DAPT. HR is in the 90s-100s in clinic today. Per review of recent hospitalization, HR was 99-112 at time of discharge so this is stable. She is not on BB as above due to history of COPD and lung cancer. F/u LFTs/lipids normal.  2. Elevated blood pressure - now controlled with losartan.Cough with lisinopril.  3. Aortic insufficiency - follow up echo  4. HLP - d/c atorvastatin and start rosuvastatin 10 mg po daily, she had an excellent response to rosuvastatin.  Pericarditis with pericardial effusion - she is now off prednisone taper, we will discontinue colchicine and ibuprofen, on echo from 05/2015 no effusion, we will repeat in 3 month.   Follow up in 3 months.  Dorothy Spark 08/07/2015

## 2015-08-07 NOTE — Patient Instructions (Signed)
Medication Instructions:   STOP TAKING IBUPROFEN NOW  STOP TAKING COLCHICINE NOW  STOP TAKING ATORVASTATIN NOW  START TAKING CRESTOR 10 MG ONCE DAILY    Testing/Procedures:  Your physician has requested that you have an echocardiogram. Echocardiography is a painless test that uses sound waves to create images of your heart. It provides your doctor with information about the size and shape of your heart and how well your heart's chambers and valves are working. This procedure takes approximately one hour. There are no restrictions for this procedure.  PER DR NELSON HAVE THIS DONE ONE WEEK PRIOR TO YOUR 3 MONTH FOLLOW-UP APPOINTMENT WITH HER     Follow-Up:  3 MONTHS WITH DR NELSON--PLEASE HAVE YOUR ECHO DONE PRIOR TO THIS APPOINTMENT

## 2015-08-08 ENCOUNTER — Encounter (HOSPITAL_COMMUNITY): Payer: Medicare Other

## 2015-08-08 ENCOUNTER — Telehealth: Payer: Self-pay | Admitting: Cardiology

## 2015-08-08 NOTE — Telephone Encounter (Signed)
If her colonoscopy is just preventive I would wait 1 year post PCI/DES = September 2017 when she can stop taking  Plavix. If it is urgent I would wait 6 months.

## 2015-08-08 NOTE — Telephone Encounter (Signed)
New problem    Pt want to know when she was hospitalized on 10.20.16 she was suppose to have had an endoscopy but she cancelled, she want to know can she now have it done with Dr Ranelle Oyster. Please advise pt.

## 2015-08-08 NOTE — Telephone Encounter (Signed)
Will route this message to Dr [name] for further review and recommendation on any contraindications for the pt to have her endoscopy done, and follow-up with the pt thereafter.

## 2015-08-08 NOTE — Telephone Encounter (Signed)
Informed the pt that per Dr [name] if her endoscopy or colonoscopy is just preventive, she recommends the pt waiting 1 year post PCI/DES=September 2017, when she can safely stop taking Plavix.  Informed the pt that if its urgent that she have these done, Dr [name] recommends that she wait 6 months.  Pt verbalized understanding, and agrees with this plan stating "its not urgent, I'm just due for follow-up, and she will take Dr Francesca Oman recommendations and wait the 1 year course of time." Pt gracious for all the assistance provided.

## 2015-08-10 ENCOUNTER — Encounter (HOSPITAL_COMMUNITY): Payer: Medicare Other

## 2015-08-15 ENCOUNTER — Telehealth (HOSPITAL_COMMUNITY): Payer: Self-pay | Admitting: Cardiac Rehabilitation

## 2015-08-15 NOTE — Telephone Encounter (Signed)
pc to [place] to verify pt benefits for cardiac rehab.  Per [facility] representative, No deductible, $4000 out of pocket max, [id number] met.  Pt has $20 copay per visit with 36 lifetime visits for cardiac rehab.  No preauth necessary.

## 2015-08-15 NOTE — Telephone Encounter (Signed)
Pt made aware of [employer] copay for cardiac rehab. Understanding verbalized

## 2015-08-17 ENCOUNTER — Encounter (HOSPITAL_COMMUNITY)
Admission: RE | Admit: 2015-08-17 | Discharge: 2015-08-17 | Disposition: A | Discharge status: Home / Self Care | Payer: Medicare Other | Source: Ambulatory Visit | Attending: Cardiology | Admitting: Cardiology

## 2015-08-21 ENCOUNTER — Ambulatory Visit: Payer: Medicare Other

## 2015-08-22 ENCOUNTER — Ambulatory Visit (INDEPENDENT_AMBULATORY_CARE_PROVIDER_SITE_OTHER): Payer: Medicare Other | Admitting: Internal Medicine

## 2015-08-22 ENCOUNTER — Telehealth: Payer: Self-pay | Admitting: Cardiology

## 2015-08-22 ENCOUNTER — Ambulatory Visit (INDEPENDENT_AMBULATORY_CARE_PROVIDER_SITE_OTHER): Payer: Medicare Other

## 2015-08-22 VITALS — BP 120/68 | HR 80 | Temp 98.4°F | Resp 18 | Ht 67.0 in | Wt 251.0 lb

## 2015-08-22 DIAGNOSIS — R05 Cough: Secondary | ICD-10-CM

## 2015-08-22 DIAGNOSIS — R059 Cough, unspecified: Secondary | ICD-10-CM

## 2015-08-22 DIAGNOSIS — E0789 Other specified disorders of thyroid: Secondary | ICD-10-CM

## 2015-08-22 DIAGNOSIS — R042 Hemoptysis: Secondary | ICD-10-CM

## 2015-08-22 MED ORDER — PROMETHAZINE-DM 6.25-15 MG/5ML PO SYRP: 5.0000 mL | ORAL_SOLUTION | Freq: Four times a day (QID) | ORAL | Status: DC | PRN

## 2015-08-22 MED ORDER — HYDROCODONE-HOMATROPINE 5-1.5 MG/5ML PO SYRP: 5.0000 mL | ORAL_SOLUTION | Freq: Four times a day (QID) | ORAL | Status: DC | PRN

## 2015-08-22 MED ORDER — ACETAMINOPHEN-CODEINE #3 300-30 MG PO TABS: 1.0000 | ORAL_TABLET | Freq: Four times a day (QID) | ORAL | Status: DC | PRN

## 2015-08-22 NOTE — Progress Notes (Signed)
Subjective:  This chart was scribed for [name] Lin, MD by [name], Medical Scribe. This patient was seen in Room 4 and the patient's care was started at 2:44 PM.   Patient ID: [name], female    DOB: [birth year], 57 y.o.   MRN: [id number]  Chief Complaint  Patient presents with  . Cough    Spitting up blood this AM/ Onset 1.5 months    HPI HPI Comments: [name] is a 59 y.o. female with a PMHx of lung CA, CAD, DM, HTN, and HLD  who presents to [facility] complaining of a non-productive cough, onset 1.5 months ago.  Pt reports that the symptoms worsen at night time, and she notes that it causes her sleep disturbance. Pt states that she had an episode of hemoptysis this morning after a forced cough that had associated bloody phlegm. She also has symptoms of voice hoarseness. Pt denies lymph nodes swelling, reflux, or post nasal drip.  Pt states that she had 2 stents placed on 09/21, and she indicates that her chest pains secondary to her later diagnosed pericarditis have much improved. Pt however reports that lisinopril was thought to be causing her cough, and she had it changed on 10/4 to cozaar She notes new tenderness on the right side of her neck since her coughing this morning. Pt reports that her diabetes symptoms are controlled. She indicates that her cbg was 105 this morning.   Patient Active Problem List   Diagnosis Date Noted  . Hyperlipidemia 08/07/2015  . CAD S/P percutaneous coronary angioplasty--w/resolution of DOE/exertional CP 07/25/2015  . Essential hypertension   . Aortic insufficiency--see recent ECHO   . Type II diabetes mellitus ([place])   . 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 Restrictive lung disease 03/14/2015  . Carcinoma of hilus of lung ([facility])--released from CA f/u this year 01/20/2012    Past Surgical History  Procedure Laterality Date  . Lung cancer surgery Left  2001    "small cell"  . Shoulder arthroscopy w/ rotator cuff repair Right ~ 2003  . Inner ear surgery Right ~ 2009  . Abdominal hysterectomy  2008  . Colonoscopy    . Knee arthroplasty Right 1991  . Carpal tunnel release Bilateral 1998-2005?    right-left  . Pulmonary embolism surgery  2007    LUNG COLLAPSE  . Esophagogastroduodenoscopy (egd) with esophageal dilation  2012  . Knee arthroscopy  ~ 2000    LATERAL RELEASE  . Foot surgery Bilateral 1980's    Callous removed   . Cardiac catheterization N/A 07/11/2015    Procedure: Left Heart Cath and Coronary Angiography;  Surgeon: Jettie Booze, MD;  Location: [place] [facility];  Service: Cardiovascular;  Laterality: N/A;  . Cardiac catheterization  07/11/2015    Procedure: Coronary Stent Intervention;  Surgeon: Jettie Booze, MD;  Location: [place] [facility];  Service: Cardiovascular;;  . Appendectomy  1969?  [name] Shoulder adhesion release Right ~ 2004  . Lung surgery Right 2007    "reinflated it"  . Cystoscopy w/ stone manipulation  2013  . Carpal tunnel release Left 2009?    "for the 2nd time"  . Coronary angioplasty     Allergies  Allergen Reactions  . Hydrocodone Hives and Itching  . Morphine And Related Other (See Comments)    GI PROBLEMS  . Lisinopril Cough    Cough   . Nsaids Other (  See Comments)    GI PROBLEMS   Prior to Admission medications   Medication Sig Start Date End Date Taking? Authorizing Provider  albuterol (PROVENTIL HFA;VENTOLIN HFA) 108 (90 BASE) MCG/ACT inhaler Inhale 1 puff into the lungs every 12 (twelve) hours as needed for wheezing or shortness of breath.     Historical Provider, MD  aspirin EC 81 MG EC tablet Take 1 tablet (81 mg total) by mouth daily. 06/01/15   Veatrice Bourbon, MD  Blood Glucose Monitoring Suppl (ONE TOUCH ULTRA MINI) W/DEVICE KIT 2 (two) times daily. as directed 05/03/15   Historical Provider, MD  clopidogrel (PLAVIX) 75 MG tablet Take 1 tablet (75 mg total) by mouth  daily with breakfast. 07/12/15   Bhavinkumar Bhagat, PA  HYDROmorphone (DILAUDID) 2 MG tablet Take 2 mg by mouth every 8 (eight) hours as needed. for pain 05/02/15   Historical Provider, MD  insulin aspart protamine- aspart (NOVOLOG MIX 70/30) (70-30) 100 UNIT/ML injection Inject into the skin. 35 units in the morning and 25 units at night    Historical Provider, MD  losartan (COZAAR) 25 MG tablet Take 1 tablet (25 mg total) by mouth daily. 07/25/15   Dayna N Dunn, PA-C  ONE TOUCH ULTRA TEST test strip 3 (three) times daily. for testing as directed 05/03/15   Historical Provider, MD  rosuvastatin (CRESTOR) 10 MG tablet Take 1 tablet (10 mg total) by mouth daily. 08/07/15   Dorothy Spark, MD   Social History   Social History  . Marital Status: Single    Spouse Name: N/A  . Number of Children: 0  . Years of Education: N/A   Occupational History  . RETIRED    Social History Main Topics  . Smoking status: Former Smoker -- 0.25 packs/day for 5 years    Types: Cigarettes    Quit date: 07/22/1999  . Smokeless tobacco: Never Used  . Alcohol Use: No  . Drug Use: No  . Sexual Activity: Not Currently    Birth Control/ Protection: Other-see comments     Comment: hysterectomy   Other Topics Concern  . Not on file   Social History Narrative    Review of Systems  Constitutional: Negative for fever, chills, diaphoresis, activity change and unexpected weight change.  HENT: Positive for voice change. Negative for postnasal drip, rhinorrhea, sneezing and trouble swallowing.   Eyes: Negative for itching.  Respiratory: Positive for cough. Negative for shortness of breath and wheezing.   Cardiovascular: Negative for chest pain, palpitations and leg swelling.      Objective:   Physical Exam  Constitutional: She is oriented to person, place, and time. She appears well-developed and well-nourished. No distress.  HENT:  Head: Normocephalic and atraumatic.  Tender over the right upper lobe of  the thyroid, without clear nodule.   Eyes: EOM are normal. Pupils are equal, round, and reactive to light.  Neck: Neck supple.  Cardiovascular: Normal rate, regular rhythm and intact distal pulses.   Murmur heard. 2/6 SEM at the outflow tract. No Carotid bruit. No diastolic murmur appreciated  Pulmonary/Chest: Effort normal and breath sounds normal. No respiratory distress. She has no wheezes. She has no rales.  Musculoskeletal: She exhibits no edema.  Neurological: She is alert and oriented to person, place, and time. No cranial nerve deficit.  Skin: Skin is warm and dry.  Psychiatric: She has a normal mood and affect. Her behavior is normal.  Nursing note and vitals reviewed.  [facility] (PRIMARY) x-ray report read by  Dr. Tami [name]: chest- ? Right hilum more indistinct than 8/ 9/16. Radiologist feels No sig change  BP 120/68 mmHg  Pulse 80  Temp(Src) 98.4 F (36.9 C) (Oral)  Resp 18  Ht _0  (1.702 m)  Wt 251 lb (113.853 kg)  BMI 39.30 kg/m2  SpO2 97%     Assessment & Plan:  Cough - Plan: DG Chest 2 View  Hemoptysis - Plan: DG Chest 2 View  Hoarseness  Tender thyroid  We will try to stabilize cough with antitussives, perform a neck ultrasound and consider whether repeat CT scan chest is indicated. Meds ordered this encounter  Medications  . promethazine-dextromethorphan (PROMETHAZINE-DM) 6.25-15 MG/5ML syrup    Sig: Take 5 mLs by mouth 4 (four) times daily as needed for cough.    Dispense:  118 mL    Refill:  0  . acetaminophen-codeine (TYLENOL #3) 300-30 MG tablet    Sig: Take 1-2 tablets by mouth every 6 (six) hours as needed for moderate pain.    Dispense:  40 tablet    Refill:  0    By signing my name below, I, Rawaa Al Rifaie, attest that this documentation has been prepared under the direction and in the presence of [name] Lin, MD.  Royann Shivers Rifaie, Medical Scribe. 08/22/2015.  2:56 PM.  I have completed the patient encounter in its entirety as  documented by the scribe, with editing by me where necessary. Robert P. [name], M.D.

## 2015-08-22 NOTE — Telephone Encounter (Signed)
New problem    Pt has a cough and need to speak to nurse about what to take. Please call pt.

## 2015-08-22 NOTE — Telephone Encounter (Signed)
Pt calling to inform Dr [name] that she has a sinus infection/head cold, and is going in to see her PCP either today or tomorrow for this, and wanted Dr [name] to advise on what cold meds she cannot take with her cardiac meds.  Informed the pt that I will speak with Dr [name] about this now, and call her back with recommendations thereafter.  Pt verbalized understanding and agrees with this plan.

## 2015-08-22 NOTE — Telephone Encounter (Signed)
Notified the pt that per Dr [name] she reviewed her cardiac meds and said the only one that would be of any concern would be her crestor.  Informed the pt that if her PCP prescribes her an antibiotic, he should monitor her liver enzymes, for some antibiotics are contraindicated when pts are on a statin.  Informed the pt that if her PCP has any questions or concerns with any meds prescribed that might be contraindicated with her cardiac meds, then he can call our office.  Informed the pt that per Dr [name], she may purchase regular Robitussin, OTC, if she needs this for her cough, for this is a safe med to take with all her other cardiac meds.  Pt verbalized understanding, agrees with this plan, and gracious for all the assistance provided.

## 2015-08-25 ENCOUNTER — Encounter (HOSPITAL_COMMUNITY)
Admission: RE | Admit: 2015-08-25 | Discharge: 2015-08-25 | Disposition: A | Discharge status: Home / Self Care | Payer: Medicare Other | Source: Ambulatory Visit | Attending: Cardiology | Admitting: Cardiology

## 2015-08-25 ENCOUNTER — Ambulatory Visit
Admission: RE | Admit: 2015-08-25 | Discharge: 2015-08-25 | Disposition: A | Discharge status: Home / Self Care | Payer: Medicare Other | Source: Ambulatory Visit | Attending: Internal Medicine | Admitting: Internal Medicine

## 2015-08-25 ENCOUNTER — Encounter: Payer: Self-pay | Admitting: Gastroenterology

## 2015-08-25 ENCOUNTER — Encounter (HOSPITAL_COMMUNITY): Payer: Self-pay

## 2015-08-25 DIAGNOSIS — Z955 Presence of coronary angioplasty implant and graft: Secondary | ICD-10-CM | POA: Diagnosis present

## 2015-08-25 DIAGNOSIS — E0789 Other specified disorders of thyroid: Secondary | ICD-10-CM

## 2015-08-25 NOTE — Progress Notes (Signed)
Pt started cardiac rehab today.  Pt tolerated light exercise without difficulty. VSS, telemetry-sinus rhythm, asymptomatic.  Pt checked her CBG using her home meter.   Medication list reconciled.  Pt verbalized compliance with medications and denies barriers to compliance. PSYCHOSOCIAL ASSESSMENT:  PHQ-0. Pt exhibits hopeful outlook with strong faith base and family support from her sister.  Pt does express limited support from other siblings and relatives, she describes relationship as dysfunctional with addiction histories.  Pt also expresses concerns about her health and despair over how long she had symptoms prior to her ultimate CAD diagnosis. Pt is eager to participate in cardiac rehab and appears motivated to improve her health.  Psychosocial barriers identified are poor coping with limited support systems.  Psychosocial goals include improved coping skills and developing support systems. Pt works as a Nature conservation officer, often juggling multiple jobs.     Pt enjoys shopping.   Pt cardiac rehab  goal is  to improve health, strength and stamina to return to her pre-DES functional ability.  Pt encouraged to participate in home exercises in addition to cardiac rehab sessions to increase ability to achieve this goal.   Pt long term cardiac rehab goal is weight loss.   Pt encouraged to attend nutrition education classes however her work schedule conflicts with class times.  Pt will meet with nutritionist individually to develop weight loss plan.  Pt oriented to exercise equipment and routine.  Understanding verbalized.

## 2015-08-28 ENCOUNTER — Encounter (HOSPITAL_COMMUNITY)
Admission: RE | Admit: 2015-08-28 | Discharge: 2015-08-28 | Disposition: A | Discharge status: Home / Self Care | Payer: Medicare Other | Source: Ambulatory Visit | Attending: Cardiology | Admitting: Cardiology

## 2015-08-28 DIAGNOSIS — Z955 Presence of coronary angioplasty implant and graft: Secondary | ICD-10-CM | POA: Diagnosis not present

## 2015-08-29 ENCOUNTER — Telehealth: Payer: Self-pay

## 2015-08-29 NOTE — Telephone Encounter (Signed)
DOOLITTLE - Pt wants to know her results of the ultra sound [phone]

## 2015-08-30 ENCOUNTER — Encounter (HOSPITAL_COMMUNITY)
Admission: RE | Admit: 2015-08-30 | Discharge: 2015-08-30 | Disposition: A | Discharge status: Home / Self Care | Payer: Medicare Other | Source: Ambulatory Visit | Attending: Cardiology | Admitting: Cardiology

## 2015-08-30 DIAGNOSIS — Z955 Presence of coronary angioplasty implant and graft: Secondary | ICD-10-CM | POA: Diagnosis not present

## 2015-08-30 NOTE — Telephone Encounter (Signed)
Please review

## 2015-08-30 NOTE — Telephone Encounter (Signed)
Ultrasound was normal--no important lumps and no explanation for her neck pain---if still hurting we should reexamine     results 1. Nonspecific mildly atrophic appearing thyroid gland. Correlation with thyroid function tests is recommended. 2. Solitary punctate (approximately 3 mm) cystic nodule within the inferior aspect of the right lobe of the thyroid does not meet imaging criteria to recommend percutaneous sampling. This recommendation follows the consensus statement: Management of Thyroid Nodules Detected at [place]: Society of Radiologists in [place]. Radiology 2005

## 2015-08-31 NOTE — Telephone Encounter (Signed)
Patient called for her ultra sound tests.   Please call

## 2015-08-31 NOTE — Telephone Encounter (Signed)
Spoke with pt, advised results. Pt understood.

## 2015-09-01 ENCOUNTER — Encounter (HOSPITAL_COMMUNITY)
Admission: RE | Admit: 2015-09-01 | Discharge: 2015-09-01 | Disposition: A | Discharge status: Home / Self Care | Payer: Medicare Other | Source: Ambulatory Visit | Attending: Cardiology | Admitting: Cardiology

## 2015-09-01 ENCOUNTER — Ambulatory Visit (HOSPITAL_COMMUNITY)
Admission: RE | Admit: 2015-09-01 | Discharge: 2015-09-01 | Disposition: A | Discharge status: Home / Self Care | Payer: Medicare Other | Source: Ambulatory Visit | Attending: Emergency Medicine | Admitting: Emergency Medicine

## 2015-09-01 ENCOUNTER — Telehealth: Payer: Self-pay | Admitting: Cardiology

## 2015-09-01 DIAGNOSIS — Z955 Presence of coronary angioplasty implant and graft: Secondary | ICD-10-CM | POA: Diagnosis not present

## 2015-09-01 NOTE — Telephone Encounter (Signed)
Rehab says that the patient stated that she has had coming and going chest pain since Wednesday.  Doesn't have pain now.  Did have pain this am.  Denies sob,nausea,vomiting or sweating.  Rehab isn't going to exercise her today.  Wants to know what we would like to do for her.  EKG was normal.  DOD recommended that she f/u with her primary care and go to ED if pain becomes worse or other symptoms develop.  Called patient and passed on this advice.  She expressed understanding of this plan.

## 2015-09-01 NOTE — Telephone Encounter (Signed)
New message      Pt c/o of Chest Pain: STAT if CP now or developed within 24 hours  1. Are you having CP right now?  no 2. Are you experiencing any other symptoms (ex. SOB, nausea, vomiting, sweating)? no 3. How long have you been experiencing CP? Since Wednesday but had cp this am 4. Is your CP continuous or coming and going? Comes and goes 5. Have you taken Nitroglycerin? no ?

## 2015-09-01 NOTE — Progress Notes (Signed)
Pt arrived at cardiac rehab c/o chest pain off and on since Wednesday night. Pt reports pain began with walking long distance from parking deck to hospital building.  Pt also she was emotionally upset at this time.  Pt states the pain improved over time but has persisted off and on. Worse at night with lying down. Pain worsened with arm movement such as stretching and deep breaths.  BP;  104/64.  Spoke to Dr. Francesca Oman triage nurse Legrand Como who will communicate with Dr. [name] and further advise pt. Pt ok to go home.  Pt advised to present to ED for worsening or severe symptoms.  Understanding verbalized.

## 2015-09-04 ENCOUNTER — Encounter (HOSPITAL_COMMUNITY): Payer: Medicare Other

## 2015-09-06 ENCOUNTER — Encounter (HOSPITAL_COMMUNITY): Payer: Medicare Other

## 2015-09-08 ENCOUNTER — Encounter (HOSPITAL_COMMUNITY): Payer: Medicare Other

## 2015-09-11 ENCOUNTER — Encounter (HOSPITAL_COMMUNITY)
Admission: RE | Admit: 2015-09-11 | Discharge: 2015-09-11 | Disposition: A | Discharge status: Home / Self Care | Payer: Medicare Other | Source: Ambulatory Visit | Attending: Cardiology | Admitting: Cardiology

## 2015-09-11 ENCOUNTER — Ambulatory Visit
Admission: RE | Admit: 2015-09-11 | Discharge: 2015-09-11 | Disposition: A | Discharge status: Home / Self Care | Payer: Medicare Other | Source: Ambulatory Visit

## 2015-09-11 DIAGNOSIS — Z955 Presence of coronary angioplasty implant and graft: Secondary | ICD-10-CM | POA: Diagnosis not present

## 2015-09-11 DIAGNOSIS — Z1231 Encounter for screening mammogram for malignant neoplasm of breast: Secondary | ICD-10-CM

## 2015-09-11 NOTE — Progress Notes (Addendum)
Reviewed home exercise with pt today.  Pt plans to walk for exercise.  Reviewed THR, pulse, RPE, sign and symptoms, and when to call 911 or MD.  Pt voiced understanding.    [employer], ACSM RCEP

## 2015-09-13 ENCOUNTER — Encounter (HOSPITAL_COMMUNITY)
Admission: RE | Admit: 2015-09-13 | Discharge: 2015-09-13 | Disposition: A | Discharge status: Home / Self Care | Payer: Medicare Other | Source: Ambulatory Visit | Attending: Cardiology | Admitting: Cardiology

## 2015-09-13 ENCOUNTER — Other Ambulatory Visit: Payer: Self-pay | Admitting: Internal Medicine

## 2015-09-13 DIAGNOSIS — Z955 Presence of coronary angioplasty implant and graft: Secondary | ICD-10-CM | POA: Diagnosis not present

## 2015-09-13 NOTE — Telephone Encounter (Signed)
Patient wants to see if she can get her medication today. I informed patient that it takes 24-48 hours. Patient states she understands but her coughing has been causing her not to sleep at night.

## 2015-09-15 NOTE — Telephone Encounter (Signed)
Patient states she is still coughing at night can we refill medication

## 2015-09-18 ENCOUNTER — Encounter (HOSPITAL_COMMUNITY): Payer: Medicare Other

## 2015-09-20 ENCOUNTER — Encounter (HOSPITAL_COMMUNITY): Payer: Medicare Other

## 2015-09-21 ENCOUNTER — Encounter: Payer: Self-pay | Admitting: Gastroenterology

## 2015-09-22 ENCOUNTER — Encounter: Payer: Self-pay | Admitting: Family Medicine

## 2015-09-22 ENCOUNTER — Encounter (HOSPITAL_COMMUNITY): Payer: Medicare Other

## 2015-09-25 ENCOUNTER — Encounter (HOSPITAL_COMMUNITY): Payer: Medicare Other

## 2015-09-27 ENCOUNTER — Encounter (HOSPITAL_COMMUNITY)
Admission: RE | Admit: 2015-09-27 | Discharge: 2015-09-27 | Disposition: A | Discharge status: Home / Self Care | Payer: Medicare Other | Source: Ambulatory Visit | Attending: Cardiology | Admitting: Cardiology

## 2015-09-27 DIAGNOSIS — Z9861 Coronary angioplasty status: Secondary | ICD-10-CM | POA: Insufficient documentation

## 2015-09-29 ENCOUNTER — Encounter (HOSPITAL_COMMUNITY): Payer: Medicare Other

## 2015-10-02 ENCOUNTER — Encounter (HOSPITAL_COMMUNITY)
Admission: RE | Admit: 2015-10-02 | Discharge: 2015-10-02 | Disposition: A | Discharge status: Home / Self Care | Payer: Medicare Other | Source: Ambulatory Visit | Attending: Cardiology | Admitting: Cardiology

## 2015-10-02 DIAGNOSIS — Z9861 Coronary angioplasty status: Secondary | ICD-10-CM | POA: Diagnosis not present

## 2015-10-04 ENCOUNTER — Encounter (HOSPITAL_COMMUNITY)
Admission: RE | Admit: 2015-10-04 | Discharge: 2015-10-04 | Disposition: A | Discharge status: Home / Self Care | Payer: Medicare Other | Source: Ambulatory Visit | Attending: Cardiology | Admitting: Cardiology

## 2015-10-04 DIAGNOSIS — Z9861 Coronary angioplasty status: Secondary | ICD-10-CM | POA: Diagnosis not present

## 2015-10-04 NOTE — Progress Notes (Signed)
QUALITY OF LIFE SCORE REVIEW  Pt completed Quality of Life survey as a participant in Cardiac Rehab. Scores 21.0 or below are considered low. Pt score  low in several areas Health and Function 17.  Patient quality of life slightly altered by physical constraints which limits ability to perform as prior to recent cardiac illness.  Pt continues to c/o chest discomfort associated with pericarditis.  Pt reports symptoms are brief feels like a "sting"  in the chest.   Pt takes ibuprofen as MD prescribed for these symptoms.  Pt has upcoming cardiology appt next week and is keeping a pain journal of the episodes to review with her cardiologist. Pt reports the symptoms are improving over time and admits she feels better now than prior to starting cardiac rehab program.  Pt feels other areas of her life are satisfactory with no concerns.  Offered emotional support and reassurance.  Will continue to monitor and intervene as necessary.

## 2015-10-06 ENCOUNTER — Encounter (HOSPITAL_COMMUNITY): Payer: Medicare Other

## 2015-10-09 ENCOUNTER — Encounter (HOSPITAL_COMMUNITY)
Admission: RE | Admit: 2015-10-09 | Discharge: 2015-10-09 | Disposition: A | Discharge status: Home / Self Care | Payer: Medicare Other | Source: Ambulatory Visit | Attending: Cardiology | Admitting: Cardiology

## 2015-10-09 DIAGNOSIS — Z9861 Coronary angioplasty status: Secondary | ICD-10-CM | POA: Diagnosis not present

## 2015-10-11 ENCOUNTER — Encounter (HOSPITAL_COMMUNITY)
Admission: RE | Admit: 2015-10-11 | Discharge: 2015-10-11 | Disposition: A | Discharge status: Home / Self Care | Payer: Medicare Other | Source: Ambulatory Visit | Attending: Cardiology | Admitting: Cardiology

## 2015-10-11 DIAGNOSIS — Z9861 Coronary angioplasty status: Secondary | ICD-10-CM | POA: Diagnosis not present

## 2015-10-13 ENCOUNTER — Encounter (HOSPITAL_COMMUNITY): Payer: Medicare Other

## 2015-10-17 ENCOUNTER — Other Ambulatory Visit: Payer: Self-pay

## 2015-10-17 MED ORDER — LOSARTAN POTASSIUM 25 MG PO TABS: 25.0000 mg | ORAL_TABLET | Freq: Every day | ORAL | Status: DC

## 2015-10-18 ENCOUNTER — Encounter (HOSPITAL_COMMUNITY)
Admission: RE | Admit: 2015-10-18 | Discharge: 2015-10-18 | Disposition: A | Discharge status: Home / Self Care | Payer: Medicare Other | Source: Ambulatory Visit | Attending: Cardiology | Admitting: Cardiology

## 2015-10-18 DIAGNOSIS — Z9861 Coronary angioplasty status: Secondary | ICD-10-CM | POA: Diagnosis not present

## 2015-10-20 ENCOUNTER — Encounter (HOSPITAL_COMMUNITY)
Admission: RE | Admit: 2015-10-20 | Discharge: 2015-10-20 | Disposition: A | Discharge status: Home / Self Care | Payer: Medicare Other | Source: Ambulatory Visit | Attending: Cardiology | Admitting: Cardiology

## 2015-10-20 DIAGNOSIS — Z9861 Coronary angioplasty status: Secondary | ICD-10-CM | POA: Diagnosis not present

## 2015-10-20 NOTE — Progress Notes (Signed)
Patient graduated from Phase 2 cardiac rehab today completing 12 sessions. She shared she is  happy with her rehab experience and plans to continue exercise at the [facility] where she will continue to work on her weight loss goal and continue increasing stamina.  Discharge PHQ score 0 and medications reviewed.

## 2015-10-23 ENCOUNTER — Encounter (HOSPITAL_COMMUNITY): Payer: Medicare Other | Attending: Cardiology

## 2015-10-23 DIAGNOSIS — Z9861 Coronary angioplasty status: Secondary | ICD-10-CM | POA: Insufficient documentation

## 2015-10-25 ENCOUNTER — Encounter (HOSPITAL_COMMUNITY): Payer: Medicare Other

## 2015-10-27 ENCOUNTER — Encounter (HOSPITAL_COMMUNITY): Payer: Medicare Other

## 2015-10-30 ENCOUNTER — Encounter (HOSPITAL_COMMUNITY): Payer: Medicare Other

## 2015-11-01 ENCOUNTER — Encounter (HOSPITAL_COMMUNITY): Payer: Medicare Other

## 2015-11-03 ENCOUNTER — Encounter (HOSPITAL_COMMUNITY): Payer: Medicare Other

## 2015-11-06 ENCOUNTER — Encounter (HOSPITAL_COMMUNITY): Payer: Medicare Other

## 2015-11-08 ENCOUNTER — Encounter (HOSPITAL_COMMUNITY): Payer: Medicare Other

## 2015-11-10 ENCOUNTER — Encounter (HOSPITAL_COMMUNITY): Payer: Medicare Other

## 2015-11-13 ENCOUNTER — Other Ambulatory Visit: Payer: Self-pay

## 2015-11-13 ENCOUNTER — Ambulatory Visit (HOSPITAL_COMMUNITY): Payer: Medicare Other | Attending: Internal Medicine

## 2015-11-13 ENCOUNTER — Encounter (HOSPITAL_COMMUNITY): Payer: Medicare Other

## 2015-11-13 DIAGNOSIS — E785 Hyperlipidemia, unspecified: Secondary | ICD-10-CM | POA: Diagnosis not present

## 2015-11-13 DIAGNOSIS — E119 Type 2 diabetes mellitus without complications: Secondary | ICD-10-CM | POA: Diagnosis not present

## 2015-11-13 DIAGNOSIS — I313 Pericardial effusion (noninflammatory): Secondary | ICD-10-CM | POA: Insufficient documentation

## 2015-11-13 DIAGNOSIS — I10 Essential (primary) hypertension: Secondary | ICD-10-CM | POA: Diagnosis not present

## 2015-11-13 DIAGNOSIS — I700 Atherosclerosis of aorta: Secondary | ICD-10-CM | POA: Insufficient documentation

## 2015-11-13 DIAGNOSIS — I351 Nonrheumatic aortic (valve) insufficiency: Secondary | ICD-10-CM | POA: Diagnosis not present

## 2015-11-15 ENCOUNTER — Encounter (HOSPITAL_COMMUNITY): Payer: Medicare Other

## 2015-11-17 ENCOUNTER — Encounter (HOSPITAL_COMMUNITY): Payer: Medicare Other

## 2015-11-20 ENCOUNTER — Encounter (HOSPITAL_COMMUNITY): Payer: Medicare Other

## 2015-11-20 ENCOUNTER — Encounter: Payer: Self-pay | Admitting: Cardiology

## 2015-11-20 ENCOUNTER — Ambulatory Visit (INDEPENDENT_AMBULATORY_CARE_PROVIDER_SITE_OTHER): Payer: Medicare Other | Admitting: Cardiology

## 2015-11-20 VITALS — BP 128/70 | HR 84 | Ht 67.0 in | Wt 255.0 lb

## 2015-11-20 DIAGNOSIS — I3139 Other pericardial effusion (noninflammatory): Secondary | ICD-10-CM

## 2015-11-20 DIAGNOSIS — I319 Disease of pericardium, unspecified: Secondary | ICD-10-CM | POA: Diagnosis not present

## 2015-11-20 DIAGNOSIS — I10 Essential (primary) hypertension: Secondary | ICD-10-CM | POA: Diagnosis not present

## 2015-11-20 DIAGNOSIS — I309 Acute pericarditis, unspecified: Secondary | ICD-10-CM | POA: Diagnosis not present

## 2015-11-20 DIAGNOSIS — Z9861 Coronary angioplasty status: Secondary | ICD-10-CM

## 2015-11-20 DIAGNOSIS — I313 Pericardial effusion (noninflammatory): Secondary | ICD-10-CM

## 2015-11-20 DIAGNOSIS — I2510 Atherosclerotic heart disease of native coronary artery without angina pectoris: Secondary | ICD-10-CM | POA: Diagnosis not present

## 2015-11-20 DIAGNOSIS — I351 Nonrheumatic aortic (valve) insufficiency: Secondary | ICD-10-CM

## 2015-11-20 MED ORDER — PANTOPRAZOLE SODIUM 40 MG PO TBEC: 40.0000 mg | DELAYED_RELEASE_TABLET | Freq: Every day | ORAL | Status: DC

## 2015-11-20 MED ORDER — COLCHICINE 0.6 MG PO TABS: 0.6000 mg | ORAL_TABLET | Freq: Two times a day (BID) | ORAL | Status: DC

## 2015-11-20 NOTE — Patient Instructions (Addendum)
Medication Instructions:  Start taking Colchicine 0.6 mg twice daily and Protonix 40 mg daily. Take Ibuprofen 200 mg (OTC) three times daily for 1 week.  Labwork: None  Testing/Procedures: None  Follow-Up: Your physician recommends that you schedule a follow-up appointment in: 6 weeks      If you need a refill on your cardiac medications before your next appointment, please call your pharmacy.

## 2015-11-20 NOTE — Progress Notes (Signed)
Patient ID: [name], female   DOB: Sep 27, 1956, 60 y.o.   MRN: [id number]     Cardiology Office Note Date:  11/20/2015  Patient ID:  [name] 12-31-1955, MRN [id number] PCP:  Jenny Reichmann, MD  Cardiologist:  Dr. [name]   Chief Complaint: f/u cath  History of Present Illness: [name] is a 60 y.o. female with history of CAD s/p DES to [facility] and mLAD, chronic pericardial effusion, DM, non-small cell lung cancer, pulmonary fibrosis, aortic insufficiency, COPD, anemia, mod AI by echo 05/2015 who presents for post-hospital follow-up.  To recap history, she was admitted in August 2016 with chest pain. She ruled out. CT was negative for PE but did show mild increased size of chronic pericardial effusion (small-moderate). Echo 05/31/15: EF 55-60%, no RWMA, grade 1 DD, mod AI, no pericardial effusion per report. She was placed on colchicine and ibuprofen therapy for presumed pericarditis. She was seen back on 06/07/15 by Dr. [name] at which time she continued to complain of chest pain. Dr. [name] reviewed her CTAs who did agree the amount of pericardial fluid had increased but more importantly the pericardium that had been thickened just locally was now significantly thickened circumferentially. The patient was placed on a prednisone taper. She was seen in clinic 06/23/15 and continued to have discomfort with deep breathing as well as when lying down at night that was improved by sitting up. She was given another steroid taper. The patient had also run out of colchicine and this was resumed. She underwent nuclear stress testing which was abnormal, thus LHC was arranged. She underwent this on 07/11/15 with resultant DES to [facility] and DES to mLAD; otherwise 70% ostial 2nd diag lesion.  DAPT for at least a year was recommended. she was started on lisinopril/HCTZ for elevated blood pressure. BB not felt to be good choice due to COPD and lung cancer. She was also started on Lipitor for CV disease. A1C was 9.1  and she was recommended to f/u PCP. Recent Hgb normal.   The patient comes in today for follow-up. She says she is feeling so much better. She has not had any recurrent chest pain except for very slight discomfort occasionally when she yawns. She happily proclaims, "I can breathe!" She does note cough since starting new meds of lisinopril/HCTZ. She says she thinks her BP was running high in the hospital because she was on prednisone. She says her BPs have been normal since discharge on the low dose ACEI/HCTZ.  11/20/2015 - 3 months follow up - the patient graduated from cardiac rehab and she continues to exercise 3/week in a gym. No chest pain or SB, no orthopnea, PND or LE edema. She has developed positional chest pain when laying on her right side, worse at night that resembles her prior episode of an acute pericarditis.   Past Medical History  Diagnosis Date  . Insomnia   . Obesity   . Erythema nodosum   . Aortic insufficiency     a. Mod by echo 05/2015.  [name] Allergy   . Anemia   . Myalgia   . Pulmonary fibrosis ([place]) 01/20/2012    Due to previous surgery and chest radiation for lung cancer  . Pneumothorax, spontaneous, tension 01/20/2012    October, 2010  . lung ca dx'd 10/1999    chemo/xrt comp 05/29/2000  . Carcinoma of hilus of lung ([place]) 01/20/2012    IIIB NSCL  Right hilar mass compressing esophagus/presenting with dysphagia Rx  Surgery/RT/chemo dx January 2001  . Type II diabetes mellitus ([place])   . Pericarditis     a. Dx 05/2015.  [name] Pericardial effusion     a. 05/2015 Echo: EF 55-60%, no rwma, Gr 1 DD, no effusion - but an effusion was seen on CT which was felt to be increased in size compared to 12/2014 - pericardium also thickened.   . Nephrolithiasis   . Complication of anesthesia     "I had a hard time waking me up w/one of my shoulder surgeries"  . Coronary artery disease     a. Abnormal stress test -> LHc 06/2015 s/p DES to mCX and mLAD, residual D2 disease treated medially.  [name]  Restrictive lung disease   . Arthritis     "knees" (07/11/2015)  . Anemia   . Elevated blood pressure     Past Surgical History  Procedure Laterality Date  . Lung cancer surgery Left 2001    "small cell"  . Shoulder arthroscopy w/ rotator cuff repair Right ~ 2003  . Inner ear surgery Right ~ 2009  . Abdominal hysterectomy  2008  . Colonoscopy    . Knee arthroplasty Right 1991  . Carpal tunnel release Bilateral 1998-2005?    right-left  . Pulmonary embolism surgery  2007    LUNG COLLAPSE  . Esophagogastroduodenoscopy (egd) with esophageal dilation  2012  . Knee arthroscopy  ~ 2000    LATERAL RELEASE  . Foot surgery Bilateral 1980's    Callous removed   . Cardiac catheterization N/A 07/11/2015    Procedure: Left Heart Cath and Coronary Angiography;  Surgeon: Jettie Booze, MD;  Location: [place] [facility];  Service: Cardiovascular;  Laterality: N/A;  . Cardiac catheterization  07/11/2015    Procedure: Coronary Stent Intervention;  Surgeon: Jettie Booze, MD;  Location: [facility];  Service: Cardiovascular;;  . Appendectomy  1969?  [name] Shoulder adhesion release Right ~ 2004  . Lung surgery Right 2007    "reinflated it"  . Cystoscopy w/ stone manipulation  2013  . Carpal tunnel release Left 2009?    "for the 2nd time"  . Coronary angioplasty      Current Outpatient Prescriptions  Medication Sig Dispense Refill  . aspirin EC 81 MG EC tablet Take 1 tablet (81 mg total) by mouth daily. 30 tablet 5  . Blood Glucose Monitoring Suppl (ONE TOUCH ULTRA MINI) W/DEVICE KIT 2 (two) times daily. as directed  3  . clopidogrel (PLAVIX) 75 MG tablet Take 1 tablet (75 mg total) by mouth daily with breakfast. 30 tablet 6  . hydrochlorothiazide (MICROZIDE) 12.5 MG capsule Take 1 capsule by mouth daily.  6  . losartan (COZAAR) 25 MG tablet Take 1 tablet (25 mg total) by mouth daily. 90 tablet 3  . meloxicam (MOBIC) 15 MG tablet Take 1 tablet by mouth daily as needed.    [name]  NOVOLOG MIX 70/30 FLEXPEN (70-30) 100 UNIT/ML FlexPen Inject 30 units in the am and 25 units in the pm in to skin daily    . ONE TOUCH ULTRA TEST test strip 3 (three) times daily. for testing as directed  12  . rosuvastatin (CRESTOR) 10 MG tablet Take 1 tablet (10 mg total) by mouth daily. 90 tablet 3  . albuterol (PROVENTIL HFA;VENTOLIN HFA) 108 (90 BASE) MCG/ACT inhaler Inhale 1 puff into the lungs every 12 (twelve) hours as needed for wheezing or shortness of breath. Reported on 11/20/2015    . pantoprazole (PROTONIX)  40 MG tablet Take 1 tablet by mouth daily. Reported on 11/20/2015  3   No current facility-administered medications for this visit.    Allergies:   Hydrocodone; Morphine and related; Lisinopril; and Nsaids   Social History:  The patient  reports that she quit smoking about 16 years ago. Her smoking use included Cigarettes. She has a 1.25 pack-year smoking history. She has never used smokeless tobacco. She reports that she does not drink alcohol or use illicit drugs.   Family History:  The patient's family history includes Diabetes in her father, maternal grandmother, mother, paternal grandmother, and sister; Heart disease in her father; Hypertension in her sister; Pancreatic cancer in her mother. There is no history of Colon cancer, Colon polyps, Rectal cancer, Stomach cancer, or Esophageal cancer.  ROS:  Please see the history of present illness. All other systems are reviewed and otherwise negative.   PHYSICAL EXAM:  VS:  BP 128/70 mmHg  Pulse 84  Ht '5\' 7"'  (1.702 m)  Wt 255 lb (115.667 kg)  BMI 39.93 kg/m2 BMI: Body mass index is 39.93 kg/(m^2).  Pulse recheck is mid-90s. Well nourished, well developed AAF, in no acute distress HEENT: normocephalic, atraumatic Neck: no JVD, carotid bruits or masses Cardiac:  normal S1, S2; RRR; harsh 4/6 diastolic aortic murmur, no rubs or gallops Lungs:  clear to auscultation bilaterally, no wheezing, rhonchi or rales Abd: soft,  nontender, no hepatomegaly, + BS MS: no deformity or atrophy Ext: no edema; right radial cath site without ecchymosis or hematoma; faint radial pulse noted Skin: warm and dry, no rash Neuro:  moves all extremities spontaneously, no focal abnormalities noted, follows commands Psych: euthymic mood, full affect   EKG:  Done today shows sinus tachycardia 101bpm, otherwise no acute St-T changes  Recent Labs: 04/18/2015: TSH 2.133 05/30/2015: B Natriuretic Peptide 35.0 07/12/2015: Hemoglobin 12.3; Platelets 243 08/04/2015: ALT 22; BUN 18; Creat 0.90; Potassium 3.7; Sodium 139  08/04/2015: Cholesterol 104*; HDL 47; LDL Cholesterol 42; Total CHOL/HDL Ratio 2.2; Triglycerides 74; VLDL 15   CrCl cannot be calculated (Patient has no serum creatinine result on file.).   Wt Readings from Last 3 Encounters:  11/20/15 255 lb (115.667 kg)  08/22/15 251 lb (113.853 kg)  08/07/15 247 lb (112.038 kg)      ASSESSMENT AND PLAN:  1. Acute pericarditis - start ibuprofen 200 mg po TID x 1 week and refill pantoprazole 40 mg po daily, start colchicine 0.6 mg po BID.  2. CAD s/p recent PCI as above - feeling much better. Continue DAPT. HR is in the 90s-100s in clinic today. Per review of recent hospitalization, HR was 99-112 at time of discharge so this is stable. She is not on BB as above due to history of COPD and lung cancer. F/u LFTs/lipids normal.  3. Hypertension - now controlled with losartan. Cough with lisinopril.  4. Aortic insufficiency - follow up echo  4. HLP - d/c atorvastatin and start rosuvastatin 10 mg po daily, she had an excellent response to rosuvastatin.  Pericarditis with pericardial effusion - she is now off prednisone taper, we will discontinue colchicine and ibuprofen, on echo from 05/2015 no effusion, we will repeat in 3 month.   Follow up in 3 months.  Dorothy Spark 11/20/2015

## 2015-11-22 ENCOUNTER — Encounter (HOSPITAL_COMMUNITY): Payer: Medicare Other

## 2015-11-23 ENCOUNTER — Ambulatory Visit (INDEPENDENT_AMBULATORY_CARE_PROVIDER_SITE_OTHER): Payer: Medicare Other | Admitting: Emergency Medicine

## 2015-11-23 ENCOUNTER — Encounter: Payer: Self-pay | Admitting: Emergency Medicine

## 2015-11-23 VITALS — BP 149/75 | HR 86 | Temp 97.7°F | Resp 16 | Ht 68.0 in | Wt 251.0 lb

## 2015-11-23 DIAGNOSIS — I300 Acute nonspecific idiopathic pericarditis: Secondary | ICD-10-CM | POA: Diagnosis not present

## 2015-11-23 DIAGNOSIS — R05 Cough: Secondary | ICD-10-CM

## 2015-11-23 DIAGNOSIS — Z23 Encounter for immunization: Secondary | ICD-10-CM | POA: Diagnosis not present

## 2015-11-23 DIAGNOSIS — R059 Cough, unspecified: Secondary | ICD-10-CM

## 2015-11-23 MED ORDER — PROMETHAZINE-DM 6.25-15 MG/5ML PO SYRP: ORAL_SOLUTION | ORAL | Status: DC

## 2015-11-23 NOTE — Progress Notes (Addendum)
By signing my name below, I, [name], attest that this documentation has been prepared under the direction and in the presence of [name] Queen, MD. Electronically Signed: Moises [name], [place]. 11/23/2015 , 2:10 PM .  Patient was seen in room 21 .  Chief Complaint:  Chief Complaint  Patient presents with  . Follow-up    diabetes  . Medication Refill    HPI: [name] is a 60 y.o. female who reports to [facility] today for follow up.   Cardiology She saw Dr. Meda [name] for cardiology visit 3 days ago. She notes that the patient has pericarditis from radiation for her lung cancer. She feels most comfortable when she leans forward. She feels uncomfortable when she lays down supine and sitting back. She feels much better after having stents placed.   Diabetes She's followed by Dr. Chalmers [name] for diabetes.   Medication She also requests for medication refill on promethazine-dm syrup. This was prescribed by Dr. Laney [name] for night time cough.   Past Medical History  Diagnosis Date  . Insomnia   . Obesity   . Erythema nodosum   . Aortic insufficiency     a. Mod by echo 05/2015.  [name] Allergy   . Anemia   . Myalgia   . Pulmonary fibrosis ([place]) 01/20/2012    Due to previous surgery and chest radiation for lung cancer  . Pneumothorax, spontaneous, tension 01/20/2012    October, 2010  . lung ca dx'd 10/1999    chemo/xrt comp 05/29/2000  . Carcinoma of hilus of lung ([place]) 01/20/2012    IIIB NSCL  Right hilar mass compressing esophagus/presenting with dysphagia Rx Surgery/RT/chemo dx January 2001  . Type II diabetes mellitus ([place])   . Pericarditis     a. Dx 05/2015.  [name] Pericardial effusion     a. 05/2015 Echo: EF 55-60%, no rwma, Gr 1 DD, no effusion - but an effusion was seen on CT which was felt to be increased in size compared to 12/2014 - pericardium also thickened.   . Nephrolithiasis   . Complication of anesthesia     "I had a hard time waking me up w/one of my shoulder surgeries"  .  Coronary artery disease     a. Abnormal stress test -> LHc 06/2015 s/p DES to mCX and mLAD, residual D2 disease treated medially.  [name] Restrictive lung disease   . Arthritis     "knees" (07/11/2015)  . Anemia   . Elevated [name] pressure    Past Surgical History  Procedure Laterality Date  . Lung cancer surgery Left 2001    "small cell"  . Shoulder arthroscopy w/ rotator cuff repair Right ~ 2003  . Inner ear surgery Right ~ 2009  . Abdominal hysterectomy  2008  . Colonoscopy    . Knee arthroplasty Right 1991  . Carpal tunnel release Bilateral 1998-2005?    right-left  . Pulmonary embolism surgery  2007    LUNG COLLAPSE  . Esophagogastroduodenoscopy (egd) with esophageal dilation  2012  . Knee arthroscopy  ~ 2000    LATERAL RELEASE  . Foot surgery Bilateral 1980's    Callous removed   . Cardiac catheterization N/A 07/11/2015    Procedure: Left Heart Cath and Coronary Angiography;  Surgeon: Jettie Booze, MD;  Location: [place] [facility];  Service: Cardiovascular;  Laterality: N/A;  . Cardiac catheterization  07/11/2015    Procedure: Coronary Stent Intervention;  Surgeon: Jettie Booze, MD;  Location: [place] [facility];  Service: Cardiovascular;;  . Appendectomy  1969?  [name] Shoulder adhesion release Right ~ 2004  . Lung surgery Right 2007    "reinflated it"  . Cystoscopy w/ stone manipulation  2013  . Carpal tunnel release Left 2009?    "for the 2nd time"  . Coronary angioplasty     Social History   Social History  . Marital Status: Single    Spouse Name: N/A  . Number of Children: 0  . Years of Education: N/A   Occupational History  . RETIRED    Social History Main Topics  . Smoking status: Former Smoker -- 0.25 packs/day for 5 years    Types: Cigarettes    Quit date: 07/22/1999  . Smokeless tobacco: Never Used  . Alcohol Use: No  . Drug Use: No  . Sexual Activity: Not Currently    Birth Control/ Protection: Other-see comments     Comment: hysterectomy     Other Topics Concern  . None   Social History Narrative   Family History  Problem Relation Age of Onset  . Heart disease Father   . Diabetes Father   . Pancreatic cancer Mother   . Diabetes Mother   . Hypertension Sister     x 3  . Diabetes Sister     x 4  . Diabetes Maternal Grandmother   . Diabetes Paternal Grandmother   . Colon cancer Neg Hx   . Colon polyps Neg Hx   . Rectal cancer Neg Hx   . Stomach cancer Neg Hx   . Esophageal cancer Neg Hx    Allergies  Allergen Reactions  . Hydrocodone Hives and Itching  . Morphine And Related Other (See Comments)    GI PROBLEMS  . Lisinopril Cough    Cough   . Nsaids Other (See Comments)    GI PROBLEMS   Prior to Admission medications   Medication Sig Start Date End Date Taking? Authorizing Provider  albuterol (PROVENTIL HFA;VENTOLIN HFA) 108 (90 BASE) MCG/ACT inhaler Inhale 1 puff into the lungs every 12 (twelve) hours as needed for wheezing or shortness of breath. Reported on 11/20/2015    Historical Provider, MD  aspirin EC 81 MG EC tablet Take 1 tablet (81 mg total) by mouth daily. 06/01/15   Veatrice Bourbon, MD  [name] Glucose Monitoring Suppl (ONE TOUCH ULTRA MINI) W/DEVICE KIT 2 (two) times daily. as directed 05/03/15   Historical Provider, MD  clopidogrel (PLAVIX) 75 MG tablet Take 1 tablet (75 mg total) by mouth daily with breakfast. 07/12/15   Bhavinkumar Bhagat, PA  colchicine 0.6 MG tablet Take 1 tablet (0.6 mg total) by mouth 2 (two) times daily. 11/20/15   Dorothy Spark, MD  hydrochlorothiazide (MICROZIDE) 12.5 MG capsule Take 1 capsule by mouth daily. 11/03/15   Historical Provider, MD  losartan (COZAAR) 25 MG tablet Take 1 tablet (25 mg total) by mouth daily. 10/17/15   Dayna N Dunn, PA-C  meloxicam (MOBIC) 15 MG tablet Take 1 tablet by mouth daily as needed. 10/30/15   Historical Provider, MD  NOVOLOG MIX 70/30 FLEXPEN (70-30) 100 UNIT/ML FlexPen Inject 30 units in the am and 25 units in the pm in to skin daily  11/17/15   Historical Provider, MD  ONE TOUCH ULTRA TEST test strip 3 (three) times daily. for testing as directed 05/03/15   Historical Provider, MD  pantoprazole (PROTONIX) 40 MG tablet Take 1 tablet (40 mg total) by mouth daily. Reported on 11/20/2015 11/20/15   Jamse Belfast  [name] Coffee, MD  rosuvastatin (CRESTOR) 10 MG tablet Take 1 tablet (10 mg total) by mouth daily. 08/07/15   Dorothy Spark, MD     ROS:  Constitutional: negative for fever, chills, night sweats, weight changes, or fatigue  HEENT: negative for vision changes, hearing loss, congestion, rhinorrhea, ST, epistaxis, or sinus pressure Cardiovascular: negative for chest pain or palpitations Respiratory: negative for hemoptysis, wheezing, shortness of breath, or cough Abdominal: negative for abdominal pain, nausea, vomiting, diarrhea, or constipation Dermatological: negative for rash Neurologic: negative for headache, dizziness, or syncope All other systems reviewed and are otherwise negative with the exception to those above and in the HPI.  PHYSICAL EXAM: Filed Vitals:   11/23/15 1338  BP: 149/75  Pulse: 86  Temp: 97.7 F (36.5 C)  Resp: 16   Body mass index is 38.17 kg/(m^2).   General: Alert, no acute distress HEENT:  Normocephalic, atraumatic, oropharynx patent. Neck: no thyromegaly Eye: E[facility], [facility] Cardiovascular:  Regular rate and rhythm, Audible rub present at left sternum border, no murmurs or gallops.  No Carotid bruits, radial pulse intact. No pedal edema.  Respiratory: Clear to auscultation bilaterally.  No wheezes, rales, or rhonchi.  No cyanosis, no use of accessory musculature Abdominal: No organomegaly, abdomen is soft and non-tender, positive bowel sounds. No masses. Musculoskeletal: Gait intact. No edema, tenderness Skin: No rashes. Neurologic: Facial musculature symmetric. Psychiatric: Patient acts appropriately throughout our interaction.  Lymphatic: No cervical or submandibular  lymphadenopathy Genitourinary/Anorectal: No acute findings   LABS:   EKG/XRAY:   Primary read interpreted by Dr. Everlene Farrier at [facility].   ASSESSMENT/PLAN:  Medication refilled. She has appointments with Dr. Chalmers [name] for her diabetes and also appointment with her cardiologist to follow her pericarditis. She is doing very well. . Flu shot today.  Gross sideeffects, risk and benefits, and alternatives of medications d/w patient. Patient is aware that all medications have potential sideeffects and we are unable to predict every sideeffect or drug-drug interaction that may occur.  [name] Queen MD 11/23/2015 2:10 PM

## 2015-11-24 ENCOUNTER — Encounter (HOSPITAL_COMMUNITY): Payer: Medicare Other

## 2015-11-27 ENCOUNTER — Encounter (HOSPITAL_COMMUNITY): Payer: Medicare Other

## 2015-11-27 ENCOUNTER — Ambulatory Visit (INDEPENDENT_AMBULATORY_CARE_PROVIDER_SITE_OTHER): Payer: Medicare Other | Admitting: Gastroenterology

## 2015-11-27 ENCOUNTER — Encounter: Payer: Self-pay | Admitting: Gastroenterology

## 2015-11-27 VITALS — BP 140/80 | HR 86 | Ht 68.0 in | Wt 253.0 lb

## 2015-11-27 DIAGNOSIS — K319 Disease of stomach and duodenum, unspecified: Secondary | ICD-10-CM | POA: Diagnosis not present

## 2015-11-27 NOTE — Progress Notes (Signed)
Review of gastrointestinal problems:  1. Routine risk for colon cancer; Feb 2009 colonoscopy with dr. Lajoyce Corners, no polyps. Next colonoscopy should be 11/2017;  09/2011 repeat colonoscopy Dr. Ardis Hughs for change in bowels (looser) found L sided diverticulosis, mucosa normal (no polyps or cancer), random biospies no microscopic colitis. 2. submucosal gastric lesion, first noted early 2009. Endoscopic ultrasound procedures have been performed, none with firm tissue diagnosis, next Examination at 3 year interval given stability, small size of lesion. Repeat endoscopic ultrasound 2010, discrete submucosal lesion, 1.8 cm. Repeat evaluation was set for 3 year interval. EGD 07/2012 Dr. Ardis Hughs showed nodule has not changed in size, recommended recall again at 3 years. 3. intermittent dysphagia since radiation, chemotherapy for lung cancer. Barium esophagram 2009 showed decreased peristalsis but no discrete strictures.   HPI: This is a  very pleasant 60 year old woman  whom I last saw about 4 years ago  Chief complaint is gastric tumor, nodule. Intermittent dysphasia  Feels overall very good.  She has difficulty with large pills hanging and sometimes with solid food and liquids as well.  This has been a problem of hers since 2009 is really not progressing.  She is on plavix; had 2 coronary stents 06/2015.  She was found to have pericarditis (due to lung cancer radiation). Since then she's been on plavix, she understands they are drug eluting stents.  Weight is up and down.   Review of systems: Pertinent positive and negative review of systems were noted in the above HPI section. Complete review of systems was performed and was otherwise normal.   Past Medical History  Diagnosis Date  . Insomnia   . Obesity   . Erythema nodosum   . Aortic insufficiency     a. Mod by echo 05/2015.  [name] Allergy   . Anemia   . Myalgia   . Pulmonary fibrosis ([place]) 01/20/2012    Due to previous surgery and chest radiation for  lung cancer  . Pneumothorax, spontaneous, tension 01/20/2012    October, 2010  . lung ca dx'd 10/1999    chemo/xrt comp 05/29/2000  . 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 Type II diabetes mellitus ([place])   . Pericarditis     a. Dx 05/2015.  [name] Pericardial effusion     a. 05/2015 Echo: EF 55-60%, no rwma, Gr 1 DD, no effusion - but an effusion was seen on CT which was felt to be increased in size compared to 12/2014 - pericardium also thickened.   . Nephrolithiasis   . Complication of anesthesia     "I had a hard time waking me up w/one of my shoulder surgeries"  . Coronary artery disease     a. Abnormal stress test -> LHc 06/2015 s/p DES to mCX and mLAD, residual D2 disease treated medially.  [name] Restrictive lung disease   . Arthritis     "knees" (07/11/2015)  . Anemia   . Elevated blood pressure     Past Surgical History  Procedure Laterality Date  . Lung cancer surgery Left 2001    "small cell"  . Shoulder arthroscopy w/ rotator cuff repair Right ~ 2003  . Inner ear surgery Right ~ 2009  . Abdominal hysterectomy  2008  . Colonoscopy    . Knee arthroplasty Right 1991  . Carpal tunnel release Bilateral 1998-2005?    right-left  . Pulmonary embolism surgery  2007    LUNG COLLAPSE  . Esophagogastroduodenoscopy (egd) with esophageal dilation  2012  . Knee arthroscopy  ~ 2000    LATERAL RELEASE  . Foot surgery Bilateral 1980's    Callous removed   . Cardiac catheterization N/A 07/11/2015    Procedure: Left Heart Cath and Coronary Angiography;  Surgeon: Jettie Booze, MD;  Location: [facility];  Service: Cardiovascular;  Laterality: N/A;  . Cardiac catheterization  07/11/2015    Procedure: Coronary Stent Intervention;  Surgeon: Jettie Booze, MD;  Location: [facility];  Service: Cardiovascular;;  . Appendectomy  1969?  [name] Shoulder adhesion  release Right ~ 2004  . Lung surgery Right 2007    "reinflated it"  . Cystoscopy w/ stone manipulation  2013  . Carpal tunnel release Left 2009?    "for the 2nd time"  . Coronary angioplasty      Current Outpatient Prescriptions  Medication Sig Dispense Refill  . albuterol (PROVENTIL HFA;VENTOLIN HFA) 108 (90 BASE) MCG/ACT inhaler Inhale 1 puff into the lungs every 12 (twelve) hours as needed for wheezing or shortness of breath. Reported on 11/20/2015    . aspirin EC 81 MG EC tablet Take 1 tablet (81 mg total) by mouth daily. 30 tablet 5  . Blood Glucose Monitoring Suppl (ONE TOUCH ULTRA MINI) W/DEVICE KIT 2 (two) times daily. as directed  3  . clopidogrel (PLAVIX) 75 MG tablet Take 1 tablet (75 mg total) by mouth daily with breakfast. 30 tablet 6  . colchicine 0.6 MG tablet Take 1 tablet (0.6 mg total) by mouth 2 (two) times daily. 60 tablet 3  . hydrochlorothiazide (MICROZIDE) 12.5 MG capsule Take 1 capsule by mouth daily.  6  . losartan (COZAAR) 25 MG tablet Take 1 tablet (25 mg total) by mouth daily. 90 tablet 3  . meloxicam (MOBIC) 15 MG tablet Take 1 tablet by mouth daily as needed.    [name] NOVOLOG MIX 70/30 FLEXPEN (70-30) 100 UNIT/ML FlexPen Inject 30 units in the am and 25 units in the pm in to skin daily    . ONE TOUCH ULTRA TEST test strip 3 (three) times daily. for testing as directed  12  . pantoprazole (PROTONIX) 40 MG tablet Take 1 tablet (40 mg total) by mouth daily. Reported on 11/20/2015 30 tablet 3  . promethazine-dextromethorphan (PROMETHAZINE-DM) 6.25-15 MG/5ML syrup Take at night every 4-6 hours as needed for cough. 118 mL 2  . PROMETHAZINE-DM PO Take by mouth as needed.    . rosuvastatin (CRESTOR) 10 MG tablet Take 1 tablet (10 mg total) by mouth daily. 90 tablet 3   No current facility-administered medications for this visit.    Allergies as of 11/27/2015 - Review Complete 11/27/2015  Allergen Reaction Noted  . Hydrocodone Hives and Itching   . Morphine and related  Other (See Comments) 11/09/2011  . Lisinopril Cough 07/25/2015  . Nsaids Other (See Comments) 11/09/2011    Family History  Problem Relation Age of Onset  . Heart disease Father   . Diabetes Father   . Pancreatic cancer Mother   . Diabetes Mother   . Hypertension Sister     x 3  . Diabetes Sister     x 4  . Diabetes Maternal Grandmother   . Diabetes Paternal Grandmother   . Colon cancer Neg Hx   . Colon polyps Neg Hx   . Rectal cancer Neg Hx   . Stomach cancer Neg Hx   . Esophageal cancer  Neg Hx     Social History   Social History  . Marital Status: Single    Spouse Name: N/A  . Number of Children: 0  . Years of Education: N/A   Occupational History  . RETIRED    Social History Main Topics  . Smoking status: Former Smoker -- 0.25 packs/day for 5 years    Types: Cigarettes    Quit date: 07/22/1999  . Smokeless tobacco: Never Used  . Alcohol Use: No  . Drug Use: No  . Sexual Activity: Not Currently    Birth Control/ Protection: Other-see comments     Comment: hysterectomy   Other Topics Concern  . Not on file   Social History Narrative     Physical Exam: BP 140/80 mmHg  Pulse 86  Ht _0  (1.727 m)  Wt 253 lb (114.76 kg)  BMI 38.48 kg/m2 Constitutional: generally well-appearing except for obesity Psychiatric: alert and oriented x3 Eyes: extraocular movements intact Mouth: oral pharynx moist, no lesions Neck: supple no lymphadenopathy Cardiovascular: heart regular rate and rhythm Lungs: clear to auscultation bilaterally Abdomen: soft, nontender, nondistended, no obvious ascites, no peritoneal signs, normal bowel sounds Extremities: no lower extremity edema bilaterally Skin: no lesions on visible extremities   Assessment and plan: 60 y.o. female with  gastric nodule, intermittent dysphasia that is chronic and nonprogressive  She has a gastric nodule that was first noted about 8 years ago. I have recommended we proceed with interval surveillance of  it to see if there is been significant change in size, character. I think it is actually safe that we do this surveillance examination while she remains on Plavix which she started about 5 months ago for acute coronary event. I did explain to her that esophageal dilation is not safe while people remain on Plavix however I think it is unlikely that she has esophageal stricture. Her dysphagia is been worked up previously, it is not changed in character, it seems that it is more likely related to some radiation damage to the esophagus causing a global motility disturbance.   Owens Loffler, MD [facility] 11/27/2015, 10:37 AM  Cc: [name], MD

## 2015-11-27 NOTE — Patient Instructions (Signed)
EGD in [place] in 6-8 weeks to re-evaluate the gastric nodule.  This can be while you are still ON plavix.

## 2015-11-29 ENCOUNTER — Encounter (HOSPITAL_COMMUNITY): Payer: Medicare Other

## 2016-01-08 ENCOUNTER — Ambulatory Visit (INDEPENDENT_AMBULATORY_CARE_PROVIDER_SITE_OTHER): Payer: Medicare Other | Admitting: Pulmonary Disease

## 2016-01-08 ENCOUNTER — Encounter: Payer: Self-pay | Admitting: Pulmonary Disease

## 2016-01-08 VITALS — BP 136/78 | HR 83 | Ht 67.5 in | Wt 251.8 lb

## 2016-01-08 DIAGNOSIS — J984 Other disorders of lung: Secondary | ICD-10-CM | POA: Diagnosis not present

## 2016-01-08 DIAGNOSIS — I319 Disease of pericardium, unspecified: Secondary | ICD-10-CM | POA: Diagnosis not present

## 2016-01-08 NOTE — Patient Instructions (Signed)
Ask cardiology if you can stop colchicine Call as needed

## 2016-01-08 NOTE — Progress Notes (Signed)
   Subjective:    Patient ID: [name], female    DOB: 04-May-1956, 60 y.o.   MRN: [id number]  HPI  22/ F, with dyspnea is attributed to restrictive lung disease s/p RUlobectomy for spont pneumothx in 10/07, s/p chemo/ XRT for stg III B lung cancer in 2001 - in remission since!   Underwent rehab in 2010.   PFTs in 11/2006 and spirometry in 06/2008 consistent with moderate restrictive lung disease. Lung volumes were decreased as was diffusion capacity, but this corrected for alveolar volume.  Spirometry 09/2009 showed decline in FEV1 from 57 to 40%. PFT 10/2009 showed FEV1 51% & FVC 47%, DLCO was 47% but corrected for alveolar volume    01/08/2016  Chief Complaint  Patient presents with  . Follow-up    breathing has been doing well. still having SOB when she walks up stairs.    She was admitted 05/2015 for a small pericardial effusion and acute pericarditis. 2-D echo showed grade 1 diastolic dysfunction with an ejection fraction of 55% with no significant pulmonary artery hypertension.she  was treated with colchicine and Motrin. She underwent a cardiac catheter 06/2015 due to persistent chest pain and required 2 stents  Her dyspnea is at baseline She denies cough or sputum production She was undergoing pulmonary rehabilitation but has stopped now  Moderate aortic regurgitation as noted on last echo and is being followed by cardiology     Significant tests/ events  CT scan from feb'11, has shown stable postoperative changes and nodular scarring in the left lung.   07/2006 right upper lobectomy and pleural abrasions for a spontaneous right pneumothorax by Dr. Roxan Hockey   01/2014 CPET >> decreased exercise tolerance, peak VO2 56% of predicted, no ventilatory limitation, no cardiac limitation-O2 pulse 73% predicted, preexercise PFTs-FVC 51%, FEV1 54%, ratio 84, MVV 65%  CT chest 05/2015 >> postop changes in both lungs, no evidence of tumor recurrence.  Echo 10/2015 -mod AI,  normal LV function, pericardial efusion resolved  Review of Systems Patient denies significant dyspnea,cough, hemoptysis,  chest pain, palpitations, pedal edema, orthopnea, paroxysmal nocturnal dyspnea, lightheadedness, nausea, vomiting, abdominal or  leg pains      Objective:   Physical Exam  Gen. Pleasant, obese, in no distress ENT - no lesions, no post nasal drip Neck: No JVD, no thyromegaly, no carotid bruits Lungs: no use of accessory muscles, no dullness to percussion, decreased without rales or rhonchi  Cardiovascular: Rhythm regular, heart sounds  normal, no murmurs or gallops, no peripheral edema Musculoskeletal: No deformities, no cyanosis or clubbing , no tremors       Assessment & Plan:

## 2016-01-09 NOTE — Assessment & Plan Note (Signed)
She will discuss with cardiology whether colchicine can be stopped now

## 2016-01-09 NOTE — Assessment & Plan Note (Signed)
Remarkably she remains cancer free based on her last CT scan from 05/2015

## 2016-01-10 ENCOUNTER — Encounter: Payer: Self-pay | Admitting: Gastroenterology

## 2016-01-10 ENCOUNTER — Ambulatory Visit (AMBULATORY_SURGERY_CENTER): Payer: Medicare Other | Admitting: Gastroenterology

## 2016-01-10 VITALS — BP 114/82 | HR 88 | Temp 97.1°F | Resp 18 | Ht 68.0 in | Wt 253.0 lb

## 2016-01-10 DIAGNOSIS — K3189 Other diseases of stomach and duodenum: Secondary | ICD-10-CM

## 2016-01-10 DIAGNOSIS — K319 Disease of stomach and duodenum, unspecified: Secondary | ICD-10-CM

## 2016-01-10 LAB — GLUCOSE, CAPILLARY
Glucose-Capillary: 124 mg/dL — ABNORMAL HIGH (ref 65–99)
Glucose-Capillary: 130 mg/dL — ABNORMAL HIGH (ref 65–99)

## 2016-01-10 MED ORDER — SODIUM CHLORIDE 0.9 % IV SOLN: 500.0000 mL | INTRAVENOUS | Status: DC

## 2016-01-10 NOTE — Progress Notes (Signed)
To recovery, report to Smith, RN, VSS 

## 2016-01-10 NOTE — Patient Instructions (Signed)
YOU HAD AN ENDOSCOPIC PROCEDURE TODAY AT [facility]:   Refer to the procedure report that was given to you for any specific questions about what was found during the examination.  If the procedure report does not answer your questions, please call your gastroenterologist to clarify.  If you requested that your care partner not be given the details of your procedure findings, then the procedure report has been included in a sealed envelope for you to review at your convenience later.   Please Note:  You might notice some irritation and congestion in your nose or some drainage.  This is from the oxygen used during your procedure.  There is no need for concern and it should clear up in a day or so.  SYMPTOMS TO REPORT IMMEDIATELY:    Following upper endoscopy (EGD)  Vomiting of blood or coffee ground material  New chest pain or pain under the shoulder blades  Painful or persistently difficult swallowing  New shortness of breath  Fever of 100F or higher  Black, tarry-looking stools  For urgent or emergent issues, a gastroenterologist can be reached at any hour by calling [phone].   DIET: Your first meal following the procedure should be a small meal and then it is ok to progress to your normal diet. Heavy or fried foods are harder to digest and may make you feel nauseous or bloated.  Likewise, meals heavy in dairy and vegetables can increase bloating.  Drink plenty of fluids but you should avoid alcoholic beverages for 24 hours.  ACTIVITY:  You should plan to take it easy for the rest of today and you should NOT DRIVE or use heavy machinery until tomorrow (because of the sedation medicines used during the test).    FOLLOW UP: Our staff will call the number listed on your records the next business day following your procedure to check on you and address any questions or concerns that you may have regarding the information given to you following your procedure. If we do  not reach you, we will leave a message.  However, if you are feeling well and you are not experiencing any problems, there is no need to return our call.  We will assume that you have returned to your regular daily activities without incident.  If any biopsies were taken you will be contacted by phone or by letter within the next 1-3 weeks.  Please call us at [phone] if you have not heard about the biopsies in 3 weeks.    SIGNATURES/CONFIDENTIALITY: You and/or your care partner have signed paperwork which will be entered into your electronic medical record.  These signatures attest to the fact that that the information above on your After Visit Summary has been reviewed and is understood.  Full responsibility of the confidentiality of this discharge information lies with you and/or your care-partner.  Continue medication and diet

## 2016-01-11 ENCOUNTER — Telehealth: Payer: Self-pay

## 2016-01-11 NOTE — Op Note (Signed)
[facility] Patient Name: [name] Procedure Date: 01/10/2016 1:14 PM MRN: [id number] Endoscopist: Milus Banister , MD Age: 60 Referring MD:  Date of Birth: [birth year] Gender: Female Procedure:                Upper GI endoscopy Indications:              Surveillance procedure; submucosal gastric lesion,                            first noted early 2009. Endoscopic ultrasound                            procedures have been performed, none with firm                            tissue diagnosis, next Examination at 3 year                            interval given stability, small size of lesion.                            Repeat endoscopic ultrasound 2010, discrete                            submucosal lesion, 1.8 cm. Repeat evaluation was                            set for 3 year interval. EGD 07/2012 Dr. Ardis [name]                            showed nodule has not changed in size, recommended                            recall again at 3 years. Medicines:                Monitored Anesthesia Care Procedure:                Pre-Anesthesia Assessment:                           - Prior to the procedure, a History and Physical                            was performed, and patient medications and                            allergies were reviewed. The patient's tolerance of                            previous anesthesia was also reviewed. The risks                            and benefits of the procedure and the sedation  options and risks were discussed with the patient.                            All questions were answered, and informed consent                            was obtained. Prior Anticoagulants: The patient has                            taken Plavix (clopidogrel), last dose was day of                            procedure. ASA Grade Assessment: III - A patient                            with severe systemic disease. After reviewing the                      risks and benefits, the patient was deemed in                            satisfactory condition to undergo the procedure.                           After obtaining informed consent, the endoscope was                            passed under direct vision. Throughout the                            procedure, the patient's blood pressure, pulse, and                            oxygen saturations were monitored continuously. The                            Model GIF-HQ190 [id number]) scope was introduced                            through the mouth, and advanced to the second part                            of duodenum. The upper GI endoscopy was                            accomplished without difficulty. The patient                            tolerated the procedure well. Scope In: Scope Out: Findings:      The esophagus was normal.      The examined duodenum was normal.      A small, submucosal mass with no bleeding and no stigmata of recent       bleeding was found in the cardia. This was about 1.8cm, essentially  unchanged from 2013 EGD. Complications:            No immediate complications. Estimated Blood Loss:     Estimated blood loss: none. Impression:               - Normal esophagus.                           - Normal examined duodenum.                           - The mass in proximal stomach has not changed in 8                            years of surveillance (<2cm).                           - No specimens collected. Recommendation:           - Patient has a contact number available for                            emergencies. The signs and symptoms of potential                            delayed complications were discussed with the                            patient. Return to normal activities tomorrow.                            Written discharge instructions were provided to the                            patient.                           - Resume  previous diet.                           - Continue present medications.                           - No repeat upper endoscopy. Procedure Code(s):        --- Professional ---                           [phone], Esophagogastroduodenoscopy, flexible,                            transoral; diagnostic, including collection of                            specimen(s) by brushing or washing, when performed                            (separate procedure) CPT copyright 2016 [employer]. All rights reserved. Milus Banister, MD [name]  [name] Hughs, MD 01/10/2016 1:24:18 PM Number of Addenda: 0 Referring MD:      [name]. [name] [name] Farrier, MD

## 2016-01-11 NOTE — Telephone Encounter (Signed)
Left message on answering machine. 

## 2016-02-13 ENCOUNTER — Other Ambulatory Visit: Payer: Self-pay | Admitting: Physician Assistant

## 2016-02-16 ENCOUNTER — Encounter: Payer: Self-pay | Admitting: Cardiology

## 2016-02-16 ENCOUNTER — Ambulatory Visit (INDEPENDENT_AMBULATORY_CARE_PROVIDER_SITE_OTHER): Payer: Medicare Other | Admitting: Cardiology

## 2016-02-16 VITALS — BP 124/72 | HR 90 | Ht 68.0 in | Wt 248.0 lb

## 2016-02-16 DIAGNOSIS — I351 Nonrheumatic aortic (valve) insufficiency: Secondary | ICD-10-CM | POA: Diagnosis not present

## 2016-02-16 DIAGNOSIS — I319 Disease of pericardium, unspecified: Secondary | ICD-10-CM | POA: Diagnosis not present

## 2016-02-16 DIAGNOSIS — I10 Essential (primary) hypertension: Secondary | ICD-10-CM | POA: Diagnosis not present

## 2016-02-16 NOTE — Patient Instructions (Signed)
Medication Instructions:   STOP TAKING IBUPROFEN IF YOU ARE TAKING THIS    Testing/Procedures:  Your physician has requested that you have an echocardiogram. Echocardiography is a painless test that uses sound waves to create images of your heart. It provides your doctor with information about the size and shape of your heart and how well your heart's chambers and valves are working. This procedure takes approximately one hour. There are no restrictions for this procedure.   Your physician has requested that you have en exercise stress myoview. For further information please visit [email or web address]. Please follow instruction sheet, as given.    Follow-Up:  2 MONTHS WITH DR [name]     If you need a refill on your cardiac medications before your next appointment, please call your pharmacy.

## 2016-02-16 NOTE — Progress Notes (Signed)
Patient ID: [name], female   DOB: [birth year], 58 y.o.   MRN: [id number]     Cardiology Office Note Date:  02/16/2016  Patient ID:  [name], [name] 1955/11/16, MRN [id number] PCP:  Jenny Reichmann, MD  Cardiologist:  Dr. [name]   Chief Complaint: f/u cath  History of Present Illness: [name] is a 60 y.o. female with history of CAD s/p DES to [facility] and mLAD, chronic pericardial effusion, DM, non-small cell lung cancer, pulmonary fibrosis, aortic insufficiency, COPD, anemia, mod AI by echo 05/2015 who presents for post-hospital follow-up.  To recap history, she was admitted in August 2016 with chest pain. She ruled out. CT was negative for PE but did show mild increased size of chronic pericardial effusion (small-moderate). Echo 05/31/15: EF 55-60%, no RWMA, grade 1 DD, mod AI, no pericardial effusion per report. She was placed on colchicine and ibuprofen therapy for presumed pericarditis. She was seen back on 06/07/15 by Dr. [name] at which time she continued to complain of chest pain. Dr. [name] reviewed her CTAs who did agree the amount of pericardial fluid had increased but more importantly the pericardium that had been thickened just locally was now significantly thickened circumferentially. The patient was placed on a prednisone taper. She was seen in clinic 06/23/15 and continued to have discomfort with deep breathing as well as when lying down at night that was improved by sitting up. She was given another steroid taper. The patient had also run out of colchicine and this was resumed. She underwent nuclear stress testing which was abnormal, thus LHC was arranged. She underwent this on 07/11/15 with resultant DES to [facility] and DES to mLAD; otherwise 70% ostial 2nd diag lesion.  DAPT for at least a year was recommended. she was started on lisinopril/HCTZ for elevated blood pressure. BB not felt to be good choice due to COPD and lung cancer. She was also started on Lipitor for CV disease. A1C was 9.1  and she was recommended to f/u PCP. Recent Hgb normal.   The patient comes in today for follow-up. She says she is feeling so much better. She has not had any recurrent chest pain except for very slight discomfort occasionally when she yawns. She happily proclaims, "I can breathe!" She does note cough since starting new meds of lisinopril/HCTZ. She says she thinks her BP was running high in the hospital because she was on prednisone. She says her BPs have been normal since discharge on the low dose ACEI/HCTZ.  02/16/2016 -  the patient is coming after 3 months, she states that ibuprofen cautiously and didn't help much, she continues to have sharp/pressure type of pain when she lays down that are relieved when she sits up, she also has exertional shortness of breath with pressure-like chest pain when she walks flight of stairs or walks from the parking lot here. In September when she got her 2 stents she initially felt relief but after some time she felt short of breath again. It still better than prior to placement of her stents but symptoms are slowly worsening. She denies any dizziness or syncope. She is compliant with her meds.   Past Medical History  Diagnosis Date  . Insomnia   . Obesity   . Erythema nodosum   . Aortic insufficiency     a. Mod by echo 05/2015.  [name] Allergy   . Anemia   . Myalgia   . Pulmonary fibrosis ([place]) 01/20/2012    Due to  previous surgery and chest radiation for lung cancer  . Pneumothorax, spontaneous, tension 01/20/2012    October, 2010  . lung ca dx'd 10/1999    chemo/xrt comp 05/29/2000  . Carcinoma of hilus of lung ([place]) 01/20/2012    IIIB NSCL  Right hilar mass compressing esophagus/presenting with dysphagia Rx Surgery/RT/chemo dx January 2001  . Type II diabetes mellitus ([place])   . Pericarditis     a. Dx 05/2015.  [name] Pericardial effusion     a. 05/2015 Echo: EF 55-60%, no rwma, Gr 1 DD, no effusion - but an effusion was seen on CT which was felt to be increased in size  compared to 12/2014 - pericardium also thickened.   . Nephrolithiasis   . Complication of anesthesia     "I had a hard time waking me up w/one of my shoulder surgeries"  . Coronary artery disease     a. Abnormal stress test -> LHc 06/2015 s/p DES to mCX and mLAD, residual D2 disease treated medially.  [name] Restrictive lung disease   . Arthritis     "knees" (07/11/2015)  . Anemia   . Elevated blood pressure   . Heart murmur     Past Surgical History  Procedure Laterality Date  . Lung cancer surgery Left 2001    "small cell"  . Shoulder arthroscopy w/ rotator cuff repair Right ~ 2003  . Inner ear surgery Right ~ 2009  . Abdominal hysterectomy  2008  . Colonoscopy    . Knee arthroplasty Right 1991  . Carpal tunnel release Bilateral 1998-2005?    right-left  . Pulmonary embolism surgery  2007    LUNG COLLAPSE  . Esophagogastroduodenoscopy (egd) with esophageal dilation  2012  . Knee arthroscopy  ~ 2000    LATERAL RELEASE  . Foot surgery Bilateral 1980's    Callous removed   . Cardiac catheterization N/A 07/11/2015    Procedure: Left Heart Cath and Coronary Angiography;  Surgeon: Jettie Booze, MD;  Location: [place] [facility];  Service: Cardiovascular;  Laterality: N/A;  . Cardiac catheterization  07/11/2015    Procedure: Coronary Stent Intervention;  Surgeon: Jettie Booze, MD;  Location: [place] [facility];  Service: Cardiovascular;;  . Appendectomy  1969?  [name] Shoulder adhesion release Right ~ 2004  . Lung surgery Right 2007    "reinflated it"  . Cystoscopy w/ stone manipulation  2013  . Carpal tunnel release Left 2009?    "for the 2nd time"  . Coronary angioplasty      Current Outpatient Prescriptions  Medication Sig Dispense Refill  . aspirin EC 81 MG EC tablet Take 1 tablet (81 mg total) by mouth daily. 30 tablet 5  . Blood Glucose Monitoring Suppl (ONE TOUCH ULTRA MINI) W/DEVICE KIT 2 (two) times daily. as directed  3  . clopidogrel (PLAVIX) 75 MG tablet Take 1  tablet (75 mg total) by mouth daily with breakfast. 30 tablet 6  . colchicine 0.6 MG tablet Take 1 tablet (0.6 mg total) by mouth 2 (two) times daily. 60 tablet 3  . HUMALOG MIX 75/25 KWIKPEN (75-25) 100 UNIT/ML Kwikpen 30u in Am, 25 in PM  6  . hydrochlorothiazide (MICROZIDE) 12.5 MG capsule Take 1 capsule by mouth daily.  6  . losartan (COZAAR) 25 MG tablet Take 1 tablet (25 mg total) by mouth daily. 90 tablet 3  . ONE TOUCH ULTRA TEST test strip 3 (three) times daily. for testing as directed  12  . pantoprazole (PROTONIX)  40 MG tablet TAKE 1 TABLET (40 MG TOTAL) BY MOUTH DAILY. REPORTED ON 11/20/2015  3  . rosuvastatin (CRESTOR) 10 MG tablet Take 1 tablet (10 mg total) by mouth daily. 90 tablet 3   No current facility-administered medications for this visit.    Allergies:   Hydrocodone; Morphine and related; Lisinopril; and Nsaids   Social History:  The patient  reports that she quit smoking about 16 years ago. Her smoking use included Cigarettes. She has a 1.25 pack-year smoking history. She has never used smokeless tobacco. She reports that she does not drink alcohol or use illicit drugs.   Family History:  The patient's family history includes Diabetes in her father, maternal grandmother, mother, paternal grandmother, and sister; Heart disease in her father; Hypertension in her sister; Pancreatic cancer in her mother. There is no history of Colon cancer, Colon polyps, Rectal cancer, Stomach cancer, or Esophageal cancer.  ROS:  Please see the history of present illness. All other systems are reviewed and otherwise negative.   PHYSICAL EXAM:  VS:  BP 124/72 mmHg  Pulse 90  Ht '5\' 8"'  (1.727 m)  Wt 248 lb (112.492 kg)  BMI 37.72 kg/m2 BMI: Body mass index is 37.72 kg/(m^2).  Pulse recheck is mid-90s. Well nourished, well developed AAF, in no acute distress HEENT: normocephalic, atraumatic Neck: no JVD, carotid bruits or masses Cardiac:  normal S1, S2; RRR; harsh 4/6 diastolic aortic  murmur, + rub Lungs:  clear to auscultation bilaterally, no wheezing, rhonchi or rales Abd: soft, nontender, no hepatomegaly, + BS MS: no deformity or atrophy Ext: no edema; right radial cath site without ecchymosis or hematoma; faint radial pulse noted Skin: warm and dry, no rash Neuro:  moves all extremities spontaneously, no focal abnormalities noted, follows commands Psych: euthymic mood, full affect   EKG:  Done today shows sinus tachycardia 101bpm, otherwise no acute St-T changes  Recent Labs: 04/18/2015: TSH 2.133 05/30/2015: B Natriuretic Peptide 35.0 07/12/2015: Hemoglobin 12.3; Platelets 243 08/04/2015: ALT 22; BUN 18; Creat 0.90; Potassium 3.7; Sodium 139  08/04/2015: Cholesterol 104*; HDL 47; LDL Cholesterol 42; Total CHOL/HDL Ratio 2.2; Triglycerides 74; VLDL 15   CrCl cannot be calculated (Patient has no serum creatinine result on file.).   Wt Readings from Last 3 Encounters:  02/16/16 248 lb (112.492 kg)  01/10/16 253 lb (114.76 kg)  01/08/16 251 lb 12.8 oz (114.216 kg)      ASSESSMENT AND PLAN:  1. Acute pericarditis - No improvement with ibuprofen and coursing, discontinue Profen continue coarsen and repeat echocardiogram for evaluation of pericardial effusion.   2. CAD s/p recent PCI as above - now with chest pain, we will repeat exercise nuclear stress test, and follow.  EKG done today showed normal sinus rhythm and is normal EKG unchanged from prior.  3. Hypertension - now controlled with losartan. Cough with lisinopril.  4. Aortic insufficiency - follow up echo  4. HLP - on atorvastatin 10 mg po daily.  Follow up in 6 weeks.   Dorothy Spark 02/16/2016

## 2016-02-29 ENCOUNTER — Telehealth (HOSPITAL_COMMUNITY): Payer: Self-pay | Admitting: *Deleted

## 2016-02-29 NOTE — Telephone Encounter (Signed)
Patient given detailed instructions per Myocardial Perfusion Study Information Sheet for the test on 03/05/16 at 1230. Patient notified to arrive 15 minutes early and that it is imperative to arrive on time for appointment to keep from having the test rescheduled.  If you need to cancel or reschedule your appointment, please call the office within 24 hours of your appointment. Failure to do so may result in a cancellation of your appointment, and a $50 no show fee. Patient verbalized understanding.Hasspacher, Ranae Palms

## 2016-03-05 ENCOUNTER — Ambulatory Visit (HOSPITAL_BASED_OUTPATIENT_CLINIC_OR_DEPARTMENT_OTHER): Payer: Medicare Other

## 2016-03-05 ENCOUNTER — Ambulatory Visit (HOSPITAL_COMMUNITY): Payer: Medicare Other | Attending: Cardiology

## 2016-03-05 ENCOUNTER — Other Ambulatory Visit: Payer: Self-pay

## 2016-03-05 DIAGNOSIS — I10 Essential (primary) hypertension: Secondary | ICD-10-CM

## 2016-03-05 DIAGNOSIS — I313 Pericardial effusion (noninflammatory): Secondary | ICD-10-CM | POA: Insufficient documentation

## 2016-03-05 DIAGNOSIS — I119 Hypertensive heart disease without heart failure: Secondary | ICD-10-CM | POA: Diagnosis not present

## 2016-03-05 DIAGNOSIS — I340 Nonrheumatic mitral (valve) insufficiency: Secondary | ICD-10-CM | POA: Diagnosis not present

## 2016-03-05 DIAGNOSIS — I2510 Atherosclerotic heart disease of native coronary artery without angina pectoris: Secondary | ICD-10-CM | POA: Insufficient documentation

## 2016-03-05 DIAGNOSIS — I319 Disease of pericardium, unspecified: Secondary | ICD-10-CM | POA: Insufficient documentation

## 2016-03-05 DIAGNOSIS — I351 Nonrheumatic aortic (valve) insufficiency: Secondary | ICD-10-CM | POA: Diagnosis not present

## 2016-03-06 ENCOUNTER — Telehealth: Payer: Self-pay | Admitting: Cardiology

## 2016-03-06 ENCOUNTER — Ambulatory Visit (HOSPITAL_COMMUNITY): Payer: Medicare Other | Attending: Cardiology

## 2016-03-06 LAB — MYOCARDIAL PERFUSION IMAGING
Estimated workload: 7 METS
Exercise duration (min): 5 min
Exercise duration (sec): 1 s
LV dias vol: 119 mL (ref 46–106)
LV sys vol: 54 mL
MPHR: 163 {beats}/min
Peak HR: 146 {beats}/min
Percent HR: 89 %
RATE: 0.2
RPE: 19
Rest HR: 64 {beats}/min
SDS: 2
SRS: 6
SSS: 8
TID: 0.91

## 2016-03-06 MED ORDER — TECHNETIUM TC 99M TETROFOSMIN IV KIT
32.1000 | PACK | Freq: Once | INTRAVENOUS | Status: AC | PRN
  Administered 2016-03-06: 32.1 via INTRAVENOUS
  Filled 2016-03-06: qty 32

## 2016-03-06 NOTE — Telephone Encounter (Signed)
PT AWARE OF ECHO RESULTS./CY 

## 2016-03-06 NOTE — Telephone Encounter (Signed)
Follow up  Pt returned call for Myoview results.

## 2016-03-13 ENCOUNTER — Ambulatory Visit: Payer: Medicare Other | Admitting: Adult Health

## 2016-03-15 ENCOUNTER — Other Ambulatory Visit: Payer: Self-pay | Admitting: Cardiology

## 2016-04-17 ENCOUNTER — Ambulatory Visit: Payer: Medicare Other | Admitting: Cardiology

## 2016-04-29 ENCOUNTER — Other Ambulatory Visit: Payer: Self-pay | Admitting: Cardiology

## 2016-05-09 ENCOUNTER — Other Ambulatory Visit: Payer: Self-pay | Admitting: Cardiology

## 2016-05-09 ENCOUNTER — Other Ambulatory Visit: Payer: Self-pay | Admitting: *Deleted

## 2016-06-03 ENCOUNTER — Telehealth: Payer: Self-pay | Admitting: Pulmonary Disease

## 2016-06-04 ENCOUNTER — Telehealth: Payer: Self-pay

## 2016-06-04 NOTE — Telephone Encounter (Signed)
Sharyn Lull can you check to make sure RA has these forms to complete.  thanks

## 2016-06-04 NOTE — Telephone Encounter (Signed)
Patient's Handicapped Registration Plate form has been completed.  Left at front desk to be picked up by patient tomorrow.  Patient aware.  TE in chart for Dr. Everlene Farrier for Handicap plate:  FYI to Dr. Everlene Farrier, we completed this for patient, please disregard your telephone encounter.  Thanks.

## 2016-06-04 NOTE — Telephone Encounter (Signed)
Form has been received and placed in Dr. Bari Mantis box for his signature.  Awaiting Dr. Elsworth Soho to come by the office to sign papers.  Will hold in my box until done.  I will close encounter once done.

## 2016-06-04 NOTE — Telephone Encounter (Signed)
Daub - Pt needs handicapped drivers registration plate filled out.  I have left this in the nurses box.  Please call her at [phone] when ready to pick up.

## 2016-06-05 NOTE — Telephone Encounter (Signed)
In your box

## 2016-06-10 NOTE — Telephone Encounter (Addendum)
DR. Everlene Farrier COMPLETED PATIENT'S [place] DIVISION OF MOTOR VEHICLES FORM. I CALLED Ceilidh Hanisch AND TOLD HER THAT IT WAS READY FOR PICK-UP. 06/10/2016. [place]

## 2016-06-18 ENCOUNTER — Emergency Department (HOSPITAL_COMMUNITY): Payer: Medicare Other

## 2016-06-18 ENCOUNTER — Emergency Department (HOSPITAL_COMMUNITY)
Admission: EM | Admit: 2016-06-18 | Discharge: 2016-06-18 | Disposition: A | Discharge status: Home / Self Care | Payer: Medicare Other | Attending: Emergency Medicine | Admitting: Emergency Medicine

## 2016-06-18 ENCOUNTER — Encounter (HOSPITAL_COMMUNITY): Payer: Self-pay

## 2016-06-18 ENCOUNTER — Telehealth: Payer: Self-pay | Admitting: Cardiology

## 2016-06-18 DIAGNOSIS — Z7902 Long term (current) use of antithrombotics/antiplatelets: Secondary | ICD-10-CM | POA: Diagnosis not present

## 2016-06-18 DIAGNOSIS — R079 Chest pain, unspecified: Secondary | ICD-10-CM | POA: Diagnosis present

## 2016-06-18 DIAGNOSIS — R200 Anesthesia of skin: Secondary | ICD-10-CM | POA: Insufficient documentation

## 2016-06-18 DIAGNOSIS — Z85118 Personal history of other malignant neoplasm of bronchus and lung: Secondary | ICD-10-CM | POA: Diagnosis not present

## 2016-06-18 DIAGNOSIS — Z7982 Long term (current) use of aspirin: Secondary | ICD-10-CM | POA: Insufficient documentation

## 2016-06-18 DIAGNOSIS — I2510 Atherosclerotic heart disease of native coronary artery without angina pectoris: Secondary | ICD-10-CM | POA: Insufficient documentation

## 2016-06-18 DIAGNOSIS — M5412 Radiculopathy, cervical region: Secondary | ICD-10-CM

## 2016-06-18 DIAGNOSIS — E119 Type 2 diabetes mellitus without complications: Secondary | ICD-10-CM | POA: Insufficient documentation

## 2016-06-18 DIAGNOSIS — R0789 Other chest pain: Secondary | ICD-10-CM

## 2016-06-18 DIAGNOSIS — Z87891 Personal history of nicotine dependence: Secondary | ICD-10-CM | POA: Insufficient documentation

## 2016-06-18 DIAGNOSIS — I10 Essential (primary) hypertension: Secondary | ICD-10-CM | POA: Insufficient documentation

## 2016-06-18 LAB — CBC
HCT: 38.5 % (ref 36.0–46.0)
Hemoglobin: 12.4 g/dL (ref 12.0–15.0)
MCH: 30.5 pg (ref 26.0–34.0)
MCHC: 32.2 g/dL (ref 30.0–36.0)
MCV: 94.8 fL (ref 78.0–100.0)
Platelets: 275 10*3/uL (ref 150–400)
RBC: 4.06 MIL/uL (ref 3.87–5.11)
RDW: 11.9 % (ref 11.5–15.5)
WBC: 5.1 10*3/uL (ref 4.0–10.5)

## 2016-06-18 LAB — BASIC METABOLIC PANEL
Anion gap: 6 (ref 5–15)
BUN: 13 mg/dL (ref 6–20)
CO2: 29 mmol/L (ref 22–32)
Calcium: 9.3 mg/dL (ref 8.9–10.3)
Chloride: 107 mmol/L (ref 101–111)
Creatinine, Ser: 0.79 mg/dL (ref 0.44–1.00)
GFR calc Af Amer: >60 mL/min (ref >60)
GFR calc non Af Amer: >60 mL/min (ref >60)
Glucose, Bld: 142 mg/dL — ABNORMAL HIGH (ref 65–99)
Potassium: 4.5 mmol/L (ref 3.5–5.1)
Sodium: 142 mmol/L (ref 135–145)

## 2016-06-18 LAB — CBG MONITORING, ED: Glucose-Capillary: 138 mg/dL — ABNORMAL HIGH (ref 65–99)

## 2016-06-18 LAB — TROPONIN I: Troponin I: <0.03 ng/mL (ref <0.03)

## 2016-06-18 LAB — I-STAT TROPONIN, ED: Troponin i, poc: 0 ng/mL (ref 0.00–0.08)

## 2016-06-18 MED ORDER — DEXAMETHASONE SODIUM PHOSPHATE 10 MG/ML IJ SOLN
10.0000 mg | Freq: Once | INTRAMUSCULAR | Status: AC
  Administered 2016-06-18: 10 mg via INTRAVENOUS
  Filled 2016-06-18: qty 1

## 2016-06-18 MED ORDER — DIPHENHYDRAMINE HCL 50 MG/ML IJ SOLN
25.0000 mg | Freq: Once | INTRAMUSCULAR | Status: AC
  Administered 2016-06-18: 25 mg via INTRAVENOUS
  Filled 2016-06-18: qty 1

## 2016-06-18 MED ORDER — ONDANSETRON HCL 4 MG/2ML IJ SOLN
4.0000 mg | Freq: Once | INTRAMUSCULAR | Status: AC
  Administered 2016-06-18: 4 mg via INTRAVENOUS
  Filled 2016-06-18: qty 2

## 2016-06-18 MED ORDER — PREDNISONE 10 MG (21) PO TBPK: 10.0000 mg | ORAL_TABLET | Freq: Every day | ORAL | 0 refills | Status: DC

## 2016-06-18 MED ORDER — MORPHINE SULFATE (PF) 4 MG/ML IV SOLN
4.0000 mg | Freq: Once | INTRAVENOUS | Status: AC
  Administered 2016-06-18: 4 mg via INTRAVENOUS
  Filled 2016-06-18: qty 1

## 2016-06-18 MED ORDER — OXYCODONE-ACETAMINOPHEN 5-325 MG PO TABS: 1.0000 | ORAL_TABLET | ORAL | 0 refills | Status: DC | PRN

## 2016-06-18 NOTE — ED Provider Notes (Signed)
[facility] DEPT Provider Note   CSN: [id number] Arrival date & time: 06/18/16  1049     History   Chief Complaint Chief Complaint  Patient presents with  . Chest Pain    HPI [name] is a 60 y.o. female.  Pt said that she has had 1 week of left arm pain.  She said that it started in her upper arm and has gone down to her fingers.  Pt reports numbness and tingling.  Pt also said that she has pain radiating into her upper chest.  The pt has a cardiac hx, but this feels different.        Past Medical History:  Diagnosis Date  . Allergy   . Anemia   . Anemia   . Aortic insufficiency    a. Mod by echo 05/2015.  . Arthritis    "knees" (07/11/2015)  . 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 Complication of anesthesia    "I had a hard time waking me up w/one of my shoulder surgeries"  . Coronary artery disease    a. Abnormal stress test -> LHc 06/2015 s/p DES to mCX and mLAD, residual D2 disease treated medially.  . Elevated blood pressure   . Erythema nodosum   . Heart murmur   . Insomnia   . lung ca dx'd 10/1999   chemo/xrt comp 05/29/2000  . Myalgia   . Nephrolithiasis   . Obesity   . Pericardial effusion    a. 05/2015 Echo: EF 55-60%, no rwma, Gr 1 DD, no effusion - but an effusion was seen on CT which was felt to be increased in size compared to 12/2014 - pericardium also thickened.   . Pericarditis    a. Dx 05/2015.  [name] Pneumothorax, spontaneous, tension 01/20/2012   October, 2010  . Pulmonary fibrosis ([place]) 01/20/2012   Due to previous surgery and chest radiation for lung cancer  . Restrictive lung disease   . Type II diabetes mellitus [facility])     Patient Active Problem List   Diagnosis Date Noted  . Hyperlipidemia 08/07/2015  . CAD S/P percutaneous coronary angioplasty 07/25/2015  . Essential hypertension   . Aortic insufficiency   . Stented  coronary artery   . Abnormal nuclear stress test 07/11/2015  . Abnormal stress test 07/04/2015  . Pericarditis   . Type II diabetes mellitus ([place])   . Pericardial effusion   . Chest pain 05/30/2015  . 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 Open angle with borderline findings and low glaucoma risk in both eyes 05/23/2015  . Restrictive lung disease 03/14/2015  . 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 Insomnia   . Obesity   . Anemia   . Kidney stones   . UNDIAGNOSED CARDIAC MURMURS 02/24/2009  . Lung cancer ([place]) 09/20/2008  . DYSPNEA 09/20/2008  . DIABETES MELLITUS, TYPE II 06/19/2007    Past Surgical History:  Procedure Laterality Date  . ABDOMINAL HYSTERECTOMY  2008  . APPENDECTOMY  1969?  [name] CARDIAC CATHETERIZATION N/A 07/11/2015   Procedure: Left Heart Cath and Coronary Angiography;  Surgeon: Jettie Booze, MD;  Location: [facility];  Service: Cardiovascular;  Laterality: N/A;  . CARDIAC CATHETERIZATION  07/11/2015   Procedure: Coronary Stent Intervention;  Surgeon: Conception Oms  Hassell Done, MD;  Location: [facility];  Service: Cardiovascular;;  . CARPAL TUNNEL RELEASE Bilateral 1998-2005?   right-left  . CARPAL TUNNEL RELEASE Left 2009?   "for the 2nd time"  . COLONOSCOPY    . CORONARY ANGIOPLASTY    . CYSTOSCOPY W/ STONE MANIPULATION  2013  . ESOPHAGOGASTRODUODENOSCOPY (EGD) WITH ESOPHAGEAL DILATION  2012  . FOOT SURGERY Bilateral 1980's   Callous removed   . INNER EAR SURGERY Right ~ 2009  . KNEE ARTHROPLASTY Right 1991  . KNEE ARTHROSCOPY  ~ 2000   LATERAL RELEASE  . LUNG CANCER SURGERY Left 2001   "small cell"  . LUNG SURGERY Right 2007   "reinflated it"  . PULMONARY EMBOLISM SURGERY  2007   LUNG COLLAPSE  . SHOULDER ADHESION RELEASE Right ~ 2004  . SHOULDER ARTHROSCOPY W/ ROTATOR CUFF REPAIR Right ~ 2003    OB History     No data available       Home Medications    Prior to Admission medications   Medication Sig Start Date End Date Taking? Authorizing Provider  aspirin EC 81 MG EC tablet Take 1 tablet (81 mg total) by mouth daily. 06/01/15  Yes Alyssa A Lincoln Brigham, MD  Blood Glucose Monitoring Suppl (ONE TOUCH ULTRA MINI) W/DEVICE KIT 2 (two) times daily. as directed 05/03/15  Yes Historical Provider, MD  clopidogrel (PLAVIX) 75 MG tablet Take 1 tablet (75 mg total) by mouth daily with breakfast. 07/12/15  Yes Bhavinkumar Bhagat, PA  colchicine 0.6 MG tablet TAKE 1 TABLET (0.6 MG TOTAL) BY MOUTH 2 (TWO) TIMES DAILY. 05/09/16  Yes Dorothy Spark, MD  HUMALOG MIX 75/25 KWIKPEN (75-25) 100 UNIT/ML Kwikpen Inject 25-35 Units into the skin See admin instructions. 35 u in Am, 25 in PM 12/22/15  Yes Historical Provider, MD  hydrochlorothiazide (MICROZIDE) 12.5 MG capsule Take 1 capsule by mouth daily. 11/03/15  Yes Historical Provider, MD  losartan (COZAAR) 25 MG tablet Take 1 tablet (25 mg total) by mouth daily. 10/17/15  Yes Dayna N Dunn, PA-C  ONE TOUCH ULTRA TEST test strip 3 (three) times daily. for testing as directed 05/03/15  Yes Historical Provider, MD  pantoprazole (PROTONIX) 40 MG tablet TAKE 1 TABLET (40 MG TOTAL) BY MOUTH DAILY. REPORTED ON 11/20/2015 04/30/16  Yes Dorothy Spark, MD  rosuvastatin (CRESTOR) 10 MG tablet Take 1 tablet (10 mg total) by mouth daily. 08/07/15  Yes Dorothy Spark, MD  oxyCODONE-acetaminophen (PERCOCET/ROXICET) 5-325 MG tablet Take 1 tablet by mouth every 4 (four) hours as needed for severe pain. 06/18/16   Isla Pence, MD  predniSONE (STERAPRED UNI-PAK 21 TAB) 10 MG (21) TBPK tablet Take 1 tablet (10 mg total) by mouth daily. Take 6 tabs by mouth daily  for 2 days, then 5 tabs for 2 days, then 4 tabs for 2 days, then 3 tabs for 2 days, 2 tabs for 2 days, then 1 tab by mouth daily for 2 days 06/18/16   Isla Pence, MD    Family History Family History  Problem Relation Age of  Onset  . Heart disease Father   . Diabetes Father   . Pancreatic cancer Mother   . Diabetes Mother   . Hypertension Sister     x 3  . Diabetes Sister     x 4  . Diabetes Maternal Grandmother   . Diabetes Paternal Grandmother   . Colon cancer Neg Hx   . Colon polyps Neg Hx   . Rectal cancer  Neg Hx   . Stomach cancer Neg Hx   . Esophageal cancer Neg Hx     Social History Social History  Substance Use Topics  . Smoking status: Former Smoker    Packs/day: 0.25    Years: 5.00    Types: Cigarettes    Quit date: 07/22/1999  . Smokeless tobacco: Never Used  . Alcohol use No     Allergies   Hydrocodone; Morphine and related; Lisinopril; and Nsaids   Review of Systems Review of Systems  Cardiovascular: Positive for chest pain.  Musculoskeletal:       Left arm pain  Neurological: Positive for numbness.  All other systems reviewed and are negative.    Physical Exam Updated Vital Signs BP 118/63   Pulse 60   Temp 98.5 F (36.9 C) (Oral)   Resp 19   Ht '5\' 7"'  (1.702 m)   Wt 252 lb (114.3 kg)   SpO2 93%   BMI 39.47 kg/m   Physical Exam  Constitutional: She is oriented to person, place, and time. She appears well-developed and well-nourished.  HENT:  Head: Normocephalic and atraumatic.  Right Ear: External ear normal.  Left Ear: External ear normal.  Nose: Nose normal.  Mouth/Throat: Oropharynx is clear and moist.  Eyes: Conjunctivae and EOM are normal. Pupils are equal, round, and reactive to light.  Neck: Normal range of motion. Neck supple.  Cardiovascular: Normal rate, regular rhythm, normal heart sounds and intact distal pulses.   Pulmonary/Chest: Effort normal and breath sounds normal.  Abdominal: Soft. Bowel sounds are normal.  Musculoskeletal: Normal range of motion.  Neurological: She is alert and oriented to person, place, and time.  Skin: Skin is warm and dry.  Psychiatric: She has a normal mood and affect. Her behavior is normal. Judgment and  thought content normal.  Nursing note and vitals reviewed.    ED Treatments / Results  Labs (all labs ordered are listed, but only abnormal results are displayed) Labs Reviewed  BASIC METABOLIC PANEL - Abnormal; Notable for the following:       Result Value   Glucose, Bld 142 (*)    All other components within normal limits  CBG MONITORING, ED - Abnormal; Notable for the following:    Glucose-Capillary 138 (*)    All other components within normal limits  CBC  TROPONIN I  URINALYSIS, ROUTINE W REFLEX MICROSCOPIC (NOT AT [facility])  I-STAT TROPOININ, ED    EKG  EKG Interpretation  Date/Time:  Tuesday June 18 2016 10:52:43 EDT Ventricular Rate:  85 PR Interval:  118 QRS Duration: 92 QT Interval:  392 QTC Calculation: 466 R Axis:   42 Text Interpretation:  Normal sinus rhythm Normal ECG Confirmed by HAVILAND MD, JULIE ([id number]) on 06/18/2016 1:52:49 PM       Radiology Dg Chest 2 View  Result Date: 06/18/2016 CLINICAL DATA:  60 year old female with sudden onset left neck pain radiating to the arm for 2 days with nausea and mid chest discomfort. Initial encounter. Personal history of lung cancer EXAM: CHEST  2 VIEW COMPARISON:  Chest radiographs 08/22/2015 and earlier. Chest CT 05/30/2015 and earlier. FINDINGS: Extensive postoperative changes to both lung apices, and the left mediastinum. Chronic left apical opacification. Lung volumes have not significantly changed since 2014. Stable mediastinal contours. Tracheal air column appears stable and within normal limits. No pneumothorax, pulmonary edema, pleural effusion or acute pulmonary opacity. Stable visualized osseous structures. IMPRESSION: Stable post treatment appearance of the chest since 2014. No new cardiopulmonary abnormality  identified. Electronically Signed   By: Genevie Ann M.D.   On: 06/18/2016 11:47   Ct Head Wo Contrast  Result Date: 06/18/2016 CLINICAL DATA:  LEFT jaw pain radiating to LEFT arm, chest pain, throat pain, on  Plavix, bruising to LEFT arm ; history pulmonary fibrosis, lung cancer, type II diabetes mellitus, coronary artery disease, pericarditis, former smoker, essential hypertension EXAM: CT HEAD WITHOUT CONTRAST CT CERVICAL SPINE WITHOUT CONTRAST TECHNIQUE: Multidetector CT imaging of the head and cervical spine was performed following the standard protocol without intravenous contrast. Multiplanar CT image reconstructions of the cervical spine were also generated. COMPARISON:  None FINDINGS: CT HEAD FINDINGS Normal ventricular morphology. No midline shift or mass effect. Normal appearance of brain parenchyma. No intracranial hemorrhage, mass lesion, evidence acute infarction, or extra-axial fluid collection. Visualized paranasal sinuses and mastoid air cells clear. Osseous structures unremarkable. CT CERVICAL SPINE FINDINGS Scattered atherosclerotic calcifications aorta and carotid arteries. Prevertebral soft tissues normal thickness. Vertebral body heights maintained. Scattered disc space narrowing and endplate spur formation greatest at C4-C5 and C5-C6. No acute fracture, subluxation, or bone destruction. Visualized skullbase intact. RIGHT apex clear. Soft tissue opacification with calcification and surgical clips at LEFT lung apex. IMPRESSION: No acute intracranial abnormalities. Degenerative disc disease changes cervical spine. No acute cervical spine abnormalities. Soft tissue opacification with scarring and postsurgical changes at LEFT lung apex suboptimally assessed by this exam, appearance consistent without identified on preceding chest radiographs. Electronically Signed   By: Lavonia Dana M.D.   On: 06/18/2016 13:34   Ct Cervical Spine Wo Contrast  Result Date: 06/18/2016 CLINICAL DATA:  LEFT jaw pain radiating to LEFT arm, chest pain, throat pain, on Plavix, bruising to LEFT arm ; history pulmonary fibrosis, lung cancer, type II diabetes mellitus, coronary artery disease, pericarditis, former smoker,  essential hypertension EXAM: CT HEAD WITHOUT CONTRAST CT CERVICAL SPINE WITHOUT CONTRAST TECHNIQUE: Multidetector CT imaging of the head and cervical spine was performed following the standard protocol without intravenous contrast. Multiplanar CT image reconstructions of the cervical spine were also generated. COMPARISON:  None FINDINGS: CT HEAD FINDINGS Normal ventricular morphology. No midline shift or mass effect. Normal appearance of brain parenchyma. No intracranial hemorrhage, mass lesion, evidence acute infarction, or extra-axial fluid collection. Visualized paranasal sinuses and mastoid air cells clear. Osseous structures unremarkable. CT CERVICAL SPINE FINDINGS Scattered atherosclerotic calcifications aorta and carotid arteries. Prevertebral soft tissues normal thickness. Vertebral body heights maintained. Scattered disc space narrowing and endplate spur formation greatest at C4-C5 and C5-C6. No acute fracture, subluxation, or bone destruction. Visualized skullbase intact. RIGHT apex clear. Soft tissue opacification with calcification and surgical clips at LEFT lung apex. IMPRESSION: No acute intracranial abnormalities. Degenerative disc disease changes cervical spine. No acute cervical spine abnormalities. Soft tissue opacification with scarring and postsurgical changes at LEFT lung apex suboptimally assessed by this exam, appearance consistent without identified on preceding chest radiographs. Electronically Signed   By: Lavonia Dana M.D.   On: 06/18/2016 13:34    Procedures Procedures (including critical care time)  Medications Ordered in ED Medications  ondansetron (ZOFRAN) injection 4 mg (4 mg Intravenous Given 06/18/16 1230)  morphine 4 MG/ML injection 4 mg (4 mg Intravenous Given 06/18/16 1230)  dexamethasone (DECADRON) injection 10 mg (10 mg Intravenous Given 06/18/16 1230)  diphenhydrAMINE (BENADRYL) injection 25 mg (25 mg Intravenous Given 06/18/16 1239)     Initial Impression /  Assessment and Plan / ED Course  I have reviewed the triage vital signs and the nursing notes.  Pertinent  labs & imaging results that were available during my care of the patient were reviewed by me and considered in my medical decision making (see chart for details).  Clinical Course   Pt feels better.  2 sets of negative troponins.  She knows to return if worse and to f/u with her pcp.  Final Clinical Impressions(s) / ED Diagnoses   Final diagnoses:  Cervical radiculopathy  Atypical chest pain    New Prescriptions New Prescriptions   OXYCODONE-ACETAMINOPHEN (PERCOCET/ROXICET) 5-325 MG TABLET    Take 1 tablet by mouth every 4 (four) hours as needed for severe pain.   PREDNISONE (STERAPRED UNI-PAK 21 TAB) 10 MG (21) TBPK TABLET    Take 1 tablet (10 mg total) by mouth daily. Take 6 tabs by mouth daily  for 2 days, then 5 tabs for 2 days, then 4 tabs for 2 days, then 3 tabs for 2 days, 2 tabs for 2 days, then 1 tab by mouth daily for 2 days     Isla Pence, MD 06/18/16 1507

## 2016-06-18 NOTE — Telephone Encounter (Signed)
Spoke with Melina Copa PA-C about pts complaints from last night.  Per Dayna, the pt did have a normal myoview back in April, but given her cardiac history and her complaints from last night, she recommends that the pt refer to [facility] ER for further chest pain work-up, with labs.  Per Dayna, the pt may need an echo done in the ER as well.  Informed the pt of Dayna's recommendations and she verbalized understanding and agrees with this plan.  Pt will report to [facility] ER around 1045, private vehicle. Will page Card Master Trish about the pt coming to the ER for further chest pain work-up.   Trish notified at 36.

## 2016-06-18 NOTE — ED Triage Notes (Signed)
Patient complains of 1 week of left arm pain, also noticed bruise to arm and on plavix. This am developed chest pain and throat pain on arrival to ED. Does have cardiac stent

## 2016-06-18 NOTE — Telephone Encounter (Signed)
New Message:     Pt's left arm am and forearm is in pain,also some neck pain and some in her left jaw, She also has a bruise on her left arm.please call asap to advise,

## 2016-06-18 NOTE — Telephone Encounter (Signed)
Pt calling in to report to Dr [name] that she has been experiencing intermittent chest pain, with left arm pain, and left jaw pain, throughout the night.  Pt rates her pain a 4 out of 10.  Pt states it doesn't feel as bad as it did last year when she was stented, but does feel "dull in nature." Pt states that she is currently asymptomatic at this current time, but all through the night she had symptoms as mentioned above.  Pt states she is being compliant with taking all her meds prescribed.  Pt states that if Dr [name] is not in the hospital, then she will proceed to [facility] ER for further eval.  Informed the pt that I will go and speak with one of our [place], and follow-up with the pt on her recommendations.  Pt verbalized understanding and agrees with this plan.

## 2016-06-26 ENCOUNTER — Ambulatory Visit: Payer: Medicare Other | Admitting: Cardiology

## 2016-07-11 ENCOUNTER — Encounter: Payer: Self-pay | Admitting: Cardiology

## 2016-07-24 ENCOUNTER — Other Ambulatory Visit: Payer: Self-pay

## 2016-07-24 ENCOUNTER — Other Ambulatory Visit: Payer: Self-pay | Admitting: Cardiology

## 2016-07-24 MED ORDER — PANTOPRAZOLE SODIUM 40 MG PO TBEC: DELAYED_RELEASE_TABLET | ORAL | 1 refills | Status: DC

## 2016-07-29 ENCOUNTER — Ambulatory Visit (INDEPENDENT_AMBULATORY_CARE_PROVIDER_SITE_OTHER): Payer: Medicare Other | Admitting: Cardiology

## 2016-07-29 ENCOUNTER — Encounter (INDEPENDENT_AMBULATORY_CARE_PROVIDER_SITE_OTHER): Payer: Self-pay

## 2016-07-29 VITALS — BP 124/64 | HR 80 | Ht 67.0 in | Wt 257.0 lb

## 2016-07-29 DIAGNOSIS — E782 Mixed hyperlipidemia: Secondary | ICD-10-CM | POA: Diagnosis not present

## 2016-07-29 DIAGNOSIS — I10 Essential (primary) hypertension: Secondary | ICD-10-CM

## 2016-07-29 DIAGNOSIS — I351 Nonrheumatic aortic (valve) insufficiency: Secondary | ICD-10-CM | POA: Diagnosis not present

## 2016-07-29 DIAGNOSIS — I2510 Atherosclerotic heart disease of native coronary artery without angina pectoris: Secondary | ICD-10-CM | POA: Diagnosis not present

## 2016-07-29 MED ORDER — ROSUVASTATIN CALCIUM 10 MG PO TABS: 10.0000 mg | ORAL_TABLET | Freq: Every day | ORAL | 3 refills | Status: DC

## 2016-07-29 MED ORDER — LOSARTAN POTASSIUM 25 MG PO TABS: 25.0000 mg | ORAL_TABLET | Freq: Every day | ORAL | 3 refills | Status: DC

## 2016-07-29 MED ORDER — HYDROCHLOROTHIAZIDE 12.5 MG PO CAPS: 12.5000 mg | ORAL_CAPSULE | Freq: Every day | ORAL | 3 refills | Status: DC

## 2016-07-29 NOTE — Patient Instructions (Signed)
Medication Instructions:   STOP TAKING PLAVIX (CLOPIDIGREL) NOW  STOP TAKING COLCHICINE NOW  STOP TAKING PROTONIX NOW  WE DISCONTINUED PREDNISONE OFF YOUR MED LIST    Follow-Up:  2 MONTHS WITH DR [name]       If you need a refill on your cardiac medications before your next appointment, please call your pharmacy.

## 2016-07-29 NOTE — Progress Notes (Signed)
Patient ID: [name], female   DOB: 04/12/1956, 60 y.o.   MRN: [id number]     Cardiology Office Note Date:  07/29/2016  Patient ID:  [name], [name] Aug 10, 1956, MRN [id number] PCP:  [name] Reichmann, MD  Cardiologist:  Dr. [name]   Chief Complaint: f/u cath  History of Present Illness: [name] is a 60 y.o. female with history of CAD s/p DES to [facility] and mLAD, chronic pericardial effusion, DM, non-small cell lung cancer, pulmonary fibrosis, aortic insufficiency, COPD, anemia, mod AI by echo 05/2015 who presents for post-hospital follow-up.  To recap history, she was admitted in August 2016 with chest pain. She ruled out. CT was negative for PE but did show mild increased size of chronic pericardial effusion (small-moderate). Echo 05/31/15: EF 55-60%, no RWMA, grade 1 DD, mod AI, no pericardial effusion per report. She was placed on colchicine and ibuprofen therapy for presumed pericarditis. She was seen back on 06/07/15 by Dr. [name] at which time she continued to complain of chest pain. Dr. [name] reviewed her CTAs who did agree the amount of pericardial fluid had increased but more importantly the pericardium that had been thickened just locally was now significantly thickened circumferentially. The patient was placed on a prednisone taper. She was seen in clinic 06/23/15 and continued to have discomfort with deep breathing as well as when lying down at night that was improved by sitting up. She was given another steroid taper. The patient had also run out of colchicine and this was resumed. She underwent nuclear stress testing which was abnormal, thus LHC was arranged. She underwent this on 07/11/15 with resultant DES to [facility] and DES to mLAD; otherwise 70% ostial 2nd diag lesion.  DAPT for at least a year was recommended. she was started on lisinopril/HCTZ for elevated blood pressure. BB not felt to be good choice due to COPD and lung cancer. She was also started on Lipitor for CV disease. A1C was 9.1  and she was recommended to f/u PCP. Recent Hgb normal.   The patient comes in today for follow-up. She says she is feeling so much better. She has not had any recurrent chest pain except for very slight discomfort occasionally when she yawns. She happily proclaims, "I can breathe!" She does note cough since starting new meds of lisinopril/HCTZ. She says she thinks her BP was running high in the hospital because she was on prednisone. She says her BPs have been normal since discharge on the low dose ACEI/HCTZ.  02/16/2016 -  the patient is coming after 3 months, she states that ibuprofen cautiously and didn't help much, she continues to have sharp/pressure type of pain when she lays down that are relieved when she sits up, she also has exertional shortness of breath with pressure-like chest pain when she walks flight of stairs or walks from the parking lot here. In September when she got her 2 stents she initially felt relief but after some time she felt short of breath again. It still better than prior to placement of her stents but symptoms are slowly worsening. She denies any dizziness or syncope. She is compliant with her meds.   07/29/2016 - this is a 6 months follow-up, symptoms of pericarditis have improved however she developed sharp in her chest associated with neck pain radiating down her left arm with tingling and lasted for about 3 days. She went to the ER on 06/18/2016, ruled out for ACS and was diagnosed with cervical discopathy. She  is also complaining of pain and swelling in her right ankle that has been going on for the last 3 weeks. She continues to go to the [facility] walks the track stable dyspnea on exertion but is able to do 6 laps without any exertional chest pain. No palpitations or syncope. She would like to simplify her medical regimen, she ran out of Crestor.  Past Medical History:  Diagnosis Date  . Allergy   . Anemia   . Anemia   . Aortic insufficiency    a. Mod by echo 05/2015.    . Arthritis    "knees" (07/11/2015)  . 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 Complication of anesthesia    "I had a hard time waking me up w/one of my shoulder surgeries"  . Coronary artery disease    a. Abnormal stress test -> LHc 06/2015 s/p DES to mCX and mLAD, residual D2 disease treated medially.  . Elevated blood pressure   . Erythema nodosum   . Heart murmur   . Insomnia   . lung ca dx'd 10/1999   chemo/xrt comp 05/29/2000  . Myalgia   . Nephrolithiasis   . Obesity   . Pericardial effusion    a. 05/2015 Echo: EF 55-60%, no rwma, Gr 1 DD, no effusion - but an effusion was seen on CT which was felt to be increased in size compared to 12/2014 - pericardium also thickened.   . Pericarditis    a. Dx 05/2015.  [name] Pneumothorax, spontaneous, tension 01/20/2012   October, 2010  . Pulmonary fibrosis ([place]) 01/20/2012   Due to previous surgery and chest radiation for lung cancer  . Restrictive lung disease   . Type II diabetes mellitus ([address])     Past Surgical History:  Procedure Laterality Date  . ABDOMINAL HYSTERECTOMY  2008  . APPENDECTOMY  1969?  [name] CARDIAC CATHETERIZATION N/A 07/11/2015   Procedure: Left Heart Cath and Coronary Angiography;  Surgeon: Jettie Booze, MD;  Location: [place] [facility];  Service: Cardiovascular;  Laterality: N/A;  . CARDIAC CATHETERIZATION  07/11/2015   Procedure: Coronary Stent Intervention;  Surgeon: Jettie Booze, MD;  Location: [place] [facility];  Service: Cardiovascular;;  . CARPAL TUNNEL RELEASE Bilateral 1998-2005?   right-left  . CARPAL TUNNEL RELEASE Left 2009?   "for the 2nd time"  . COLONOSCOPY    . CORONARY ANGIOPLASTY    . CYSTOSCOPY W/ STONE MANIPULATION  2013  . ESOPHAGOGASTRODUODENOSCOPY (EGD) WITH ESOPHAGEAL DILATION  2012  . FOOT SURGERY Bilateral 1980's   Callous removed   . INNER EAR SURGERY Right ~ 2009   . KNEE ARTHROPLASTY Right 1991  . KNEE ARTHROSCOPY  ~ 2000   LATERAL RELEASE  . LUNG CANCER SURGERY Left 2001   "small cell"  . LUNG SURGERY Right 2007   "reinflated it"  . PULMONARY EMBOLISM SURGERY  2007   LUNG COLLAPSE  . SHOULDER ADHESION RELEASE Right ~ 2004  . SHOULDER ARTHROSCOPY W/ ROTATOR CUFF REPAIR Right ~ 2003    Current Outpatient Prescriptions  Medication Sig Dispense Refill  . aspirin EC 81 MG EC tablet Take 1 tablet (81 mg total) by mouth daily. 30 tablet 5  . Blood Glucose Monitoring Suppl (ONE TOUCH ULTRA MINI) W/DEVICE KIT 2 (two) times daily. as directed  3  . clopidogrel (PLAVIX) 75 MG tablet Take 1 tablet (75 mg total) by  mouth daily with breakfast. 30 tablet 6  . colchicine 0.6 MG tablet TAKE 1 TABLET (0.6 MG TOTAL) BY MOUTH 2 (TWO) TIMES DAILY. 60 tablet 3  . HUMALOG MIX 75/25 KWIKPEN (75-25) 100 UNIT/ML Kwikpen Inject 25-35 Units into the skin See admin instructions. 35 u in Am, 25 in PM  6  . hydrochlorothiazide (MICROZIDE) 12.5 MG capsule Take 1 capsule by mouth daily.  6  . losartan (COZAAR) 25 MG tablet Take 1 tablet (25 mg total) by mouth daily. 90 tablet 3  . meloxicam (MOBIC) 15 MG tablet Take 1 tablet by mouth daily.    . ONE TOUCH ULTRA TEST test strip 3 (three) times daily. for testing as directed  12  . pantoprazole (PROTONIX) 40 MG tablet TAKE 1 TABLET (40 MG TOTAL) BY MOUTH DAILY. REPORTED ON 11/20/2015 30 tablet 1  . rosuvastatin (CRESTOR) 10 MG tablet Take 1 tablet (10 mg total) by mouth daily. (Patient not taking: Reported on 07/29/2016) 90 tablet 3   No current facility-administered medications for this visit.     Allergies:   Hydrocodone; Morphine and related; Lisinopril; and Nsaids   Social History:  The patient  reports that she quit smoking about 17 years ago. Her smoking use included Cigarettes. She has a 1.25 pack-year smoking history. She has never used smokeless tobacco. She reports that she does not drink alcohol or use drugs.    Family History:  The patient's family history includes Diabetes in her father, maternal grandmother, mother, paternal grandmother, and sister; Heart disease in her father; Hypertension in her sister; Pancreatic cancer in her mother.  ROS:  Please see the history of present illness. All other systems are reviewed and otherwise negative.   PHYSICAL EXAM:  VS:  BP 124/64   Pulse 80   Ht '5\' 7"'  (1.702 m)   Wt 257 lb (116.6 kg)   BMI 40.25 kg/m  BMI: Body mass index is 40.25 kg/m.  Pulse recheck is mid-90s. Well nourished, well developed AAF, in no acute distress  HEENT: normocephalic, atraumatic  Neck: no JVD, carotid bruits or masses Cardiac:  normal S1, S2; RRR; harsh 4/6 diastolic aortic murmur, + rub Lungs:  clear to auscultation bilaterally, no wheezing, rhonchi or rales  Abd: soft, nontender, no hepatomegaly, + BS MS: no deformity or atrophy Ext: no edema; right radial cath site without ecchymosis or hematoma; faint radial pulse noted Skin: warm and dry, no rash Neuro:  moves all extremities spontaneously, no focal abnormalities noted, follows commands Psych: euthymic mood, full affect   EKG:  Done today shows sinus tachycardia 101bpm, otherwise no acute St-T changes  Recent Labs: 08/04/2015: ALT 22 06/18/2016: BUN 13; Creatinine, Ser 0.79; Hemoglobin 12.4; Platelets 275; Potassium 4.5; Sodium 142  08/04/2015: Cholesterol 104; HDL 47; LDL Cholesterol 42; Total CHOL/HDL Ratio 2.2; Triglycerides 74; VLDL 15   CrCl cannot be calculated (Patient's most recent lab result is older than the maximum 21 days allowed.).   Wt Readings from Last 3 Encounters:  07/29/16 257 lb (116.6 kg)  06/18/16 252 lb (114.3 kg)  02/16/16 248 lb (112.5 kg)    TTE: 03/05/16 - Left ventricle: The cavity size was normal. Wall thickness was   normal. Systolic function was normal. The estimated ejection   fraction was in the range of 50% to 55%. Wall motion was normal;   there were no regional wall  motion abnormalities. Doppler   parameters are consistent with abnormal left ventricular   relaxation (grade 1 diastolic dysfunction). -  Aortic valve: There was mild regurgitation. - Mitral valve: There was mild regurgitation. - Right ventricle: The cavity size was mildly dilated. - Right atrium: The atrium was mildly dilated. - Pericardium, extracardiac: A small pericardial effusion was   identified. Impressions: - Normal LV systolic function; grade 1 diastolic function; mild AI;   mild MR; mild RAE and RVE; small pericardial effusion.  Lexiscan nuclear stress test: 03/06/16  Nuclear stress EF: 54%. No wall motion abnormality  There was no ST segment deviation noted during stress.  Defect 1: There is a small defect of mild severity present in the apical septal and apex location. Fixed, no ischemia, question artifactual  This is a low risk study. No ischemia identified    ASSESSMENT AND PLAN:  1. Acute pericarditis - improvement of symptoms, mild pericardial effusion on the last echocardiogram in April 2017.  2. CAD 07/11/15 with resultant DES to [facility] and DES to mLAD; otherwise 70% ostial 2nd diag lesion, no anginal symptoms only atypical pains most probably related to cervical radiculopathy. We will continue aspirin, discontinue Plavix as she is bruising easily, refill Crestor, continue losartan. She is advised to contact her orthopedic surgeon with cervical radiculopathy and possibly chiropractor if cleared by orthopedic surgeon.  3. Hypertension - now controlled with losartan. Cough with lisinopril.  4. Aortic insufficiency - follow up echo showed just mild AI despite loud murmur  4. HLP - on crestor, she ran out, we will restart and recheck at the next visit  Follow up in 2 months.   Ena Dawley 07/29/2016

## 2016-08-12 ENCOUNTER — Other Ambulatory Visit: Payer: Self-pay | Admitting: Emergency Medicine

## 2016-08-12 DIAGNOSIS — Z1231 Encounter for screening mammogram for malignant neoplasm of breast: Secondary | ICD-10-CM

## 2016-09-09 ENCOUNTER — Emergency Department (HOSPITAL_COMMUNITY): Payer: Medicare Other

## 2016-09-09 ENCOUNTER — Emergency Department (HOSPITAL_COMMUNITY)
Admission: EM | Admit: 2016-09-09 | Discharge: 2016-09-09 | Disposition: A | Discharge status: Home / Self Care | Payer: Medicare Other | Attending: Emergency Medicine | Admitting: Emergency Medicine

## 2016-09-09 DIAGNOSIS — Z96651 Presence of right artificial knee joint: Secondary | ICD-10-CM | POA: Insufficient documentation

## 2016-09-09 DIAGNOSIS — I480 Paroxysmal atrial fibrillation: Secondary | ICD-10-CM | POA: Diagnosis not present

## 2016-09-09 DIAGNOSIS — I2510 Atherosclerotic heart disease of native coronary artery without angina pectoris: Secondary | ICD-10-CM | POA: Insufficient documentation

## 2016-09-09 DIAGNOSIS — Z7982 Long term (current) use of aspirin: Secondary | ICD-10-CM | POA: Diagnosis not present

## 2016-09-09 DIAGNOSIS — E11319 Type 2 diabetes mellitus with unspecified diabetic retinopathy without macular edema: Secondary | ICD-10-CM | POA: Diagnosis not present

## 2016-09-09 DIAGNOSIS — Z79899 Other long term (current) drug therapy: Secondary | ICD-10-CM | POA: Insufficient documentation

## 2016-09-09 DIAGNOSIS — Z87891 Personal history of nicotine dependence: Secondary | ICD-10-CM | POA: Diagnosis not present

## 2016-09-09 DIAGNOSIS — I10 Essential (primary) hypertension: Secondary | ICD-10-CM | POA: Diagnosis not present

## 2016-09-09 DIAGNOSIS — R0789 Other chest pain: Secondary | ICD-10-CM | POA: Diagnosis present

## 2016-09-09 LAB — COMPREHENSIVE METABOLIC PANEL
ALT: 23 U/L (ref 14–54)
AST: 29 U/L (ref 15–41)
Albumin: 3.6 g/dL (ref 3.5–5.0)
Alkaline Phosphatase: 98 U/L (ref 38–126)
Anion gap: 11 (ref 5–15)
BUN: 23 mg/dL — ABNORMAL HIGH (ref 6–20)
CO2: 23 mmol/L (ref 22–32)
Calcium: 9.2 mg/dL (ref 8.9–10.3)
Chloride: 106 mmol/L (ref 101–111)
Creatinine, Ser: 1.08 mg/dL — ABNORMAL HIGH (ref 0.44–1.00)
GFR calc Af Amer: >60 mL/min (ref >60)
GFR calc non Af Amer: 55 mL/min — ABNORMAL LOW (ref >60)
Glucose, Bld: 169 mg/dL — ABNORMAL HIGH (ref 65–99)
Potassium: 4.1 mmol/L (ref 3.5–5.1)
Sodium: 140 mmol/L (ref 135–145)
Total Bilirubin: 0.2 mg/dL — ABNORMAL LOW (ref 0.3–1.2)
Total Protein: 6.7 g/dL (ref 6.5–8.1)

## 2016-09-09 LAB — CBC WITH DIFFERENTIAL/PLATELET
Basophils Absolute: 0 10*3/uL (ref 0.0–0.1)
Basophils Relative: 0 %
Eosinophils Absolute: 0.7 10*3/uL (ref 0.0–0.7)
Eosinophils Relative: 7 %
HCT: 36.7 % (ref 36.0–46.0)
Hemoglobin: 12.3 g/dL (ref 12.0–15.0)
Lymphocytes Relative: 32 %
Lymphs Abs: 2.9 10*3/uL (ref 0.7–4.0)
MCH: 30.8 pg (ref 26.0–34.0)
MCHC: 33.5 g/dL (ref 30.0–36.0)
MCV: 92 fL (ref 78.0–100.0)
Monocytes Absolute: 0.8 10*3/uL (ref 0.1–1.0)
Monocytes Relative: 9 %
Neutro Abs: 4.7 10*3/uL (ref 1.7–7.7)
Neutrophils Relative %: 52 %
Platelets: 337 10*3/uL (ref 150–400)
RBC: 3.99 MIL/uL (ref 3.87–5.11)
RDW: 12 % (ref 11.5–15.5)
WBC: 9 10*3/uL (ref 4.0–10.5)

## 2016-09-09 LAB — TROPONIN I: Troponin I: 0.03 ng/mL (ref <0.03)

## 2016-09-09 MED ORDER — DILTIAZEM LOAD VIA INFUSION
15.0000 mg | Freq: Once | INTRAVENOUS | Status: AC
  Administered 2016-09-09: 15 mg via INTRAVENOUS
  Filled 2016-09-09: qty 15

## 2016-09-09 MED ORDER — DILTIAZEM HCL 30 MG PO TABS
30.0000 mg | ORAL_TABLET | Freq: Once | ORAL | Status: AC
  Administered 2016-09-09: 30 mg via ORAL
  Filled 2016-09-09 (×2): qty 1

## 2016-09-09 MED ORDER — HEPARIN BOLUS VIA INFUSION
4000.0000 [IU] | Freq: Once | INTRAVENOUS | Status: AC
  Administered 2016-09-09: 4000 [IU] via INTRAVENOUS
  Filled 2016-09-09: qty 4000

## 2016-09-09 MED ORDER — ASPIRIN 81 MG PO CHEW
324.0000 mg | CHEWABLE_TABLET | Freq: Once | ORAL | Status: AC
  Administered 2016-09-09: 324 mg via ORAL
  Filled 2016-09-09: qty 4

## 2016-09-09 MED ORDER — APIXABAN 5 MG PO TABS: 5.0000 mg | ORAL_TABLET | Freq: Two times a day (BID) | ORAL | 0 refills | Status: DC

## 2016-09-09 MED ORDER — APIXABAN 5 MG PO TABS
5.0000 mg | ORAL_TABLET | Freq: Two times a day (BID) | ORAL | Status: DC
  Administered 2016-09-09: 5 mg via ORAL
  Filled 2016-09-09 (×2): qty 1

## 2016-09-09 MED ORDER — DILTIAZEM HCL 100 MG IV SOLR
INTRAVENOUS | Status: AC
  Filled 2016-09-09: qty 100

## 2016-09-09 MED ORDER — DILTIAZEM HCL ER 60 MG PO CP12: 60.0000 mg | ORAL_CAPSULE | Freq: Two times a day (BID) | ORAL | 0 refills | Status: DC

## 2016-09-09 MED ORDER — DILTIAZEM HCL 100 MG IV SOLR
5.0000 mg/h | INTRAVENOUS | Status: DC
  Administered 2016-09-09: 5 mg/h via INTRAVENOUS

## 2016-09-09 MED ORDER — HEPARIN (PORCINE) IN NACL 100-0.45 UNIT/ML-% IJ SOLN
1300.0000 [IU]/h | INTRAMUSCULAR | Status: DC
  Administered 2016-09-09: 1300 [IU]/h via INTRAVENOUS
  Filled 2016-09-09: qty 250

## 2016-09-09 NOTE — ED Notes (Signed)
ED Provider at bedside. 

## 2016-09-09 NOTE — ED Triage Notes (Signed)
Pt had a sudden onset of chest pain and sob. Pt has hr 180 in triage.

## 2016-09-09 NOTE — ED Notes (Signed)
Critical Troponin 0.03. MD notified.

## 2016-09-09 NOTE — ED Notes (Signed)
Pt stating she feels better and would like to go home now.  Will speak to MD

## 2016-09-09 NOTE — ED Notes (Signed)
Pt noted to have oxygen saturations in the low 90's. Pt placed on 2L [place]. Saturations now at 96%. Will cont to monitor.

## 2016-09-09 NOTE — ED Notes (Signed)
Spoke with Main pharmacy regarding pt Cardizem and Eliquis doses.

## 2016-09-09 NOTE — ED Notes (Signed)
EDP at bedside updating pt 

## 2016-09-09 NOTE — Progress Notes (Signed)
ANTICOAGULATION CONSULT NOTE - Initial Consult  Pharmacy Consult for heparin Indication: atrial fibrillation  Allergies  Allergen Reactions  . Hydrocodone Hives and Itching  . Morphine And Related Other (See Comments)    GI PROBLEMS  . Lisinopril Cough    Cough   . Nsaids Other (See Comments)    GI PROBLEMS    Patient Measurements: TBW 116 kg IBW: 61.6 kg  Heparin Dosing Weight: 88 kg   Vital Signs: Temp: 98 F (36.7 C) (11/20 1506) Temp Source: Oral (11/20 1506) Pulse Rate: 180 (11/20 1506)  Labs: No results for input(s): HGB, HCT, PLT, APTT, LABPROT, INR, HEPARINUNFRC, HEPRLOWMOCWT, CREATININE, CKTOTAL, CKMB, TROPONINI in the last 72 hours.  CrCl cannot be calculated (Patient's most recent lab result is older than the maximum 21 days allowed.).   Medical History: Past Medical History:  Diagnosis Date  . Allergy   . Anemia   . Anemia   . Aortic insufficiency    a. Mod by echo 05/2015.  . Arthritis    "knees" (07/11/2015)  . Carcinoma of hilus of lung ([place]) 01/20/2012   IIIB NSCL  Right hilar mass compressing esophagus/presenting with dysphagia Rx Surgery/RT/chemo dx January 2001  . Complication of anesthesia    "I had a hard time waking me up w/one of my shoulder surgeries"  . Coronary artery disease    a. Abnormal stress test -> LHc 06/2015 s/p DES to mCX and mLAD, residual D2 disease treated medially.  . Elevated blood pressure   . Erythema nodosum   . Heart murmur   . Insomnia   . lung ca dx'd 10/1999   chemo/xrt comp 05/29/2000  . Myalgia   . Nephrolithiasis   . Obesity   . Pericardial effusion    a. 05/2015 Echo: EF 55-60%, no rwma, Gr 1 DD, no effusion - but an effusion was seen on CT which was felt to be increased in size compared to 12/2014 - pericardium also thickened.   . Pericarditis    a. Dx 05/2015.  [name] Pneumothorax, spontaneous, tension 01/20/2012   October, 2010  . Pulmonary fibrosis ([place]) 01/20/2012   Due to previous surgery and chest radiation for  lung cancer  . Restrictive lung disease   . Type II diabetes mellitus [facility])     Assessment: 60 yo F presents on 11/20 with CP and SOB. No anticoag PTA. Noted to be in Afib.  CHADSVASc = 4. CBC stable, no s/s of bleed.  Goal of Therapy:  Heparin level 0.3-0.7 units/ml Monitor platelets by anticoagulation protocol: Yes   Plan:  Give 4,000 unit heparin bolus Start heparin gtt at 1,300 units/hr Check 6 hr heparin level Monitor daily heparin level, CBC, s/s of bleed  Elenor Quinones, PharmD, [facility] Clinical Pharmacist Pager [phone] 09/09/2016 3:39 PM

## 2016-09-09 NOTE — ED Notes (Signed)
Spoke to pharmacy, states okay to d/c infusions with no overlap.  Oral meds requested from Pharmacy

## 2016-09-09 NOTE — ED Provider Notes (Signed)
[facility] DEPT Provider Note   CSN: [id number] Arrival date & time: 09/09/16  1453     History   Chief Complaint Chief Complaint  Patient presents with  . Chest Pain    HPI [name] is a 60 y.o. female.  She has history of hypertension, diabetes, aortic insufficiency, coronary artery disease with stent. At about 12:30 PM, she noted a severe pressure feeling in her chest with radiation to the left arm with associated dyspnea and diaphoresis but no nausea. There was associated sense of her heart racing. She rated her discomfort at 10/10. She went to fire rescue and was transferred here. She also has a remote history of lung cancer and has a history of pericarditis. She has not received any treatment for her chest pain or heart rate. Currently, she states that her chest pressure is very mild.   The history is provided by the patient.    Past Medical History:  Diagnosis Date  . Allergy   . Anemia   . Anemia   . Aortic insufficiency    a. Mod by echo 05/2015.  . Arthritis    "knees" (07/11/2015)  . 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 Complication of anesthesia    "I had a hard time waking me up w/one of my shoulder surgeries"  . Coronary artery disease    a. Abnormal stress test -> LHc 06/2015 s/p DES to mCX and mLAD, residual D2 disease treated medially.  . Elevated blood pressure   . Erythema nodosum   . Heart murmur   . Insomnia   . lung ca dx'd 10/1999   chemo/xrt comp 05/29/2000  . Myalgia   . Nephrolithiasis   . Obesity   . Pericardial effusion    a. 05/2015 Echo: EF 55-60%, no rwma, Gr 1 DD, no effusion - but an effusion was seen on CT which was felt to be increased in size compared to 12/2014 - pericardium also thickened.   . Pericarditis    a. Dx 05/2015.  [name] Pneumothorax, spontaneous, tension 01/20/2012   October, 2010  . Pulmonary  fibrosis ([place]) 01/20/2012   Due to previous surgery and chest radiation for lung cancer  . Restrictive lung disease   . Type II diabetes mellitus [facility])     Patient Active Problem List   Diagnosis Date Noted  . Hyperlipidemia 08/07/2015  . CAD S/P percutaneous coronary angioplasty 07/25/2015  . Essential hypertension   . Aortic insufficiency   . Stented coronary artery   . Abnormal nuclear stress test 07/11/2015  . Abnormal stress test 07/04/2015  . Pericarditis   . Type II diabetes mellitus ([place])   . Pericardial effusion   . Chest pain 05/30/2015  . 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 Open angle with borderline findings and low glaucoma risk in both eyes 05/23/2015  . Restrictive lung disease 03/14/2015  . 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 Insomnia   . Obesity   . Anemia   . Kidney stones   . UNDIAGNOSED CARDIAC MURMURS 02/24/2009  . Lung cancer ([place]) 09/20/2008  . DYSPNEA 09/20/2008  . DIABETES MELLITUS, TYPE II 06/19/2007    Past Surgical History:  Procedure Laterality Date  . ABDOMINAL HYSTERECTOMY  2008  . APPENDECTOMY  1969?  [name]  CARDIAC CATHETERIZATION N/A 07/11/2015   Procedure: Left Heart Cath and Coronary Angiography;  Surgeon: Jettie Booze, MD;  Location: [facility];  Service: Cardiovascular;  Laterality: N/A;  . CARDIAC CATHETERIZATION  07/11/2015   Procedure: Coronary Stent Intervention;  Surgeon: Jettie Booze, MD;  Location: [facility];  Service: Cardiovascular;;  . CARPAL TUNNEL RELEASE Bilateral 1998-2005?   right-left  . CARPAL TUNNEL RELEASE Left 2009?   "for the 2nd time"  . COLONOSCOPY    . CORONARY ANGIOPLASTY    . CYSTOSCOPY W/ STONE MANIPULATION  2013  . ESOPHAGOGASTRODUODENOSCOPY (EGD) WITH ESOPHAGEAL DILATION  2012  . FOOT SURGERY Bilateral 1980's   Callous removed   . INNER EAR SURGERY  Right ~ 2009  . KNEE ARTHROPLASTY Right 1991  . KNEE ARTHROSCOPY  ~ 2000   LATERAL RELEASE  . LUNG CANCER SURGERY Left 2001   "small cell"  . LUNG SURGERY Right 2007   "reinflated it"  . PULMONARY EMBOLISM SURGERY  2007   LUNG COLLAPSE  . SHOULDER ADHESION RELEASE Right ~ 2004  . SHOULDER ARTHROSCOPY W/ ROTATOR CUFF REPAIR Right ~ 2003    OB History    No data available       Home Medications    Prior to Admission medications   Medication Sig Start Date End Date Taking? Authorizing Provider  aspirin EC 81 MG EC tablet Take 1 tablet (81 mg total) by mouth daily. 06/01/15   Veatrice Bourbon, MD  Blood Glucose Monitoring Suppl (ONE TOUCH ULTRA MINI) W/DEVICE KIT 2 (two) times daily. as directed 05/03/15   Historical Provider, MD  HUMALOG MIX 75/25 KWIKPEN (75-25) 100 UNIT/ML Kwikpen Inject 25-35 Units into the skin See admin instructions. 35 u in Am, 25 in PM 12/22/15   Historical Provider, MD  hydrochlorothiazide (MICROZIDE) 12.5 MG capsule Take 1 capsule (12.5 mg total) by mouth daily. 07/29/16   Dorothy Spark, MD  losartan (COZAAR) 25 MG tablet Take 1 tablet (25 mg total) by mouth daily. 07/29/16   Dorothy Spark, MD  meloxicam (MOBIC) 15 MG tablet Take 1 tablet by mouth daily. 07/02/16   Historical Provider, MD  ONE TOUCH ULTRA TEST test strip 3 (three) times daily. for testing as directed 05/03/15   Historical Provider, MD  rosuvastatin (CRESTOR) 10 MG tablet Take 1 tablet (10 mg total) by mouth daily. 07/29/16   Dorothy Spark, MD    Family History Family History  Problem Relation Age of Onset  . Heart disease Father   . Diabetes Father   . Pancreatic cancer Mother   . Diabetes Mother   . Hypertension Sister     x 3  . Diabetes Sister     x 4  . Diabetes Maternal Grandmother   . Diabetes Paternal Grandmother   . Colon cancer Neg Hx   . Colon polyps Neg Hx   . Rectal cancer Neg Hx   . Stomach cancer Neg Hx   . Esophageal cancer Neg Hx     Social History Social  History  Substance Use Topics  . Smoking status: Former Smoker    Packs/day: 0.25    Years: 5.00    Types: Cigarettes    Quit date: 07/22/1999  . Smokeless tobacco: Never Used  . Alcohol use No     Allergies   Hydrocodone; Morphine and related; Lisinopril; and Nsaids   Review of Systems Review of Systems  All other systems reviewed and are negative.  Physical Exam Updated Vital Signs BP 109/93   Pulse (!) 52   Temp 98 F (36.7 C) (Oral)   Resp 18   SpO2 97%   Physical Exam  Nursing note and vitals reviewed.  60 year old female, resting comfortably and in no acute distress. Vital signs are significant for tachycardia. Oxygen saturation is 97%, which is normal. Head is normocephalic and atraumatic. PERRLA, EOMI. Oropharynx is clear. Neck is nontender and supple without adenopathy or JVD. Back is nontender and there is no CVA tenderness. Lungs are clear without rales, wheezes, or rhonchi. Chest is nontender. Heart is tachycardic and irregular without murmur. Abdomen is soft, flat, nontender without masses or hepatosplenomegaly and peristalsis is normoactive. Extremities have 2+ edema, full range of motion is present. Skin is warm and dry without rash. Neurologic: Mental status is normal, cranial nerves are intact, there are no motor or sensory deficits.  ED Treatments / Results  Labs (all labs ordered are listed, but only abnormal results are displayed) Labs Reviewed  COMPREHENSIVE METABOLIC PANEL - Abnormal; Notable for the following:       Result Value   Glucose, Bld 169 (*)    BUN 23 (*)    Creatinine, Ser 1.08 (*)    Total Bilirubin 0.2 (*)    GFR calc non Af Amer 55 (*)    All other components within normal limits  TROPONIN I - Abnormal; Notable for the following:    Troponin I 0.03 (*)    All other components within normal limits  CBC WITH DIFFERENTIAL/PLATELET  HEPARIN LEVEL (UNFRACTIONATED)  CBC  HEPARIN LEVEL (UNFRACTIONATED)    EKG  EKG  Interpretation  Date/Time:  Monday September 09 2016 15:00:53 EST Ventricular Rate:  181 PR Interval:    QRS Duration: 84 QT Interval:  276 QTC Calculation: 479 R Axis:   61 Text Interpretation:  Atrial fibrillation with rapid ventricular response Abnormal ECG When compared with ECG of 06/18/2016, Atrial fibrillation with rapid ventricular response has replaced Sinus rhythm Confirmed by [facility]  MD, DAVID ([id number]) on 09/09/2016 3:16:29 PM      Repeat ECG Sinus tachycardia with rate of 107. Normal axis. Normal intervals. Mild PR depression and/or ST elevation in inferior leads. When compared with ECG of earlier today, sinus tachycardia has replaced atrial fibrillation with rapid ventricular response.  Radiology Dg Chest Port 1 View  Result Date: 09/09/2016 CLINICAL DATA:  Chest pain EXAM: PORTABLE CHEST 1 VIEW COMPARISON:  06/18/2016 FINDINGS: Chronic cardiomegaly and postoperative distortion at the left more than right apex with volume loss on the left. Increased right atrial prominence is likely due to rotation and technique. Coronary stent is noted. Chronic interstitial coarsening. There is no edema, consolidation, effusion, or pneumothorax. No acute osseous finding. Distal right clavicle resection. IMPRESSION: Low volume chest without acute finding when compared to prior. Electronically Signed   By: Monte Fantasia M.D.   On: 09/09/2016 16:02    Procedures Procedures (including critical care time) CRITICAL CARE Performed by: EXNTZ,GYFVC Total critical care time: 45 minutes Critical care time was exclusive of separately billable procedures and treating other patients. Critical care was necessary to treat or prevent imminent or life-threatening deterioration. Critical care was time spent personally by me on the following activities: development of treatment plan with patient and/or surrogate as well as nursing, discussions with consultants, evaluation of patient's response to treatment,  examination of patient, obtaining history from patient or surrogate, ordering and performing treatments and interventions, ordering and review of laboratory  studies, ordering and review of radiographic studies, pulse oximetry and re-evaluation of patient's condition.  Medications Ordered in ED Medications  diltiazem (CARDIZEM) 1 mg/mL load via infusion 15 mg (15 mg Intravenous Bolus from Bag 09/09/16 1552)    And  diltiazem (CARDIZEM) 100 mg in dextrose 5 % 100 mL (1 mg/mL) infusion (5 mg/hr Intravenous New Bag/Given 09/09/16 1551)  diltiazem (CARDIZEM) tablet 30 mg (not administered)  apixaban (ELIQUIS) tablet 5 mg (not administered)  aspirin chewable tablet 324 mg (324 mg Oral Given 09/09/16 1541)  heparin bolus via infusion 4,000 Units (4,000 Units Intravenous Bolus from Bag 09/09/16 1601)     Initial Impression / Assessment and Plan / ED Course  I have reviewed the triage vital signs and the nursing notes.  Pertinent labs & imaging results that were available during my care of the patient were reviewed by me and considered in my medical decision making (see chart for details).  Clinical Course    Atrial fibrillation with rapid ventricular response. However, while I was in the room, several times she reverted to sinus rhythm only to revert back to atrial fibrillation with rapid ventricular response. Because of this, I do not feel she would be a candidate for cardioversion without some medication to stabilize her heart rhythm. I do not see any prior episodes of atrial fibrillation on record. CHADS-VASC score equals 4, which gives her 6.7% annual risk of stroke or TIA. She is given aspirin and started on heparin. Blood pressure is adequate, so she is given diltiazem bolus and drip.  Following above-noted treatment, patient converted to sinus rhythm and has been stable in sinus rhythm for several hours. It was felt that she was safe to discharge this point. Because of her rapid ventricular  response, she will be discharged with prescription for diltiazem as well as apixaban and she is referred back to her cardiologist for further outpatient workup. Minimal elevation of troponin is felt to be demand ischemia. She has been completely symptom free since converting to sinus rhythm.  Final Clinical Impressions(s) / ED Diagnoses   Final diagnoses:  Paroxysmal atrial fibrillation with rapid ventricular response (HCC)    New Prescriptions New Prescriptions   APIXABAN (ELIQUIS) 5 MG TABS TABLET    Take 1 tablet (5 mg total) by mouth 2 (two) times daily.   DILTIAZEM (CARDIZEM SR) 60 MG 12 HR CAPSULE    Take 1 capsule (60 mg total) by mouth 2 (two) times daily.     Delora Fuel, MD 84/78/41 [id number]

## 2016-09-10 ENCOUNTER — Telehealth: Payer: Self-pay

## 2016-09-10 NOTE — Progress Notes (Signed)
I can see her on Wednesday afternoon 11/29 or on Thursday 11/30.

## 2016-09-10 NOTE — Progress Notes (Signed)
Ivy, Can any PA see her in 1 week? Thank you, [name]

## 2016-09-14 ENCOUNTER — Emergency Department (HOSPITAL_COMMUNITY): Payer: Medicare Other

## 2016-09-14 ENCOUNTER — Emergency Department (HOSPITAL_COMMUNITY)
Admission: EM | Admit: 2016-09-14 | Discharge: 2016-09-14 | Disposition: A | Discharge status: Home / Self Care | Payer: Medicare Other | Attending: Emergency Medicine | Admitting: Emergency Medicine

## 2016-09-14 ENCOUNTER — Encounter (HOSPITAL_COMMUNITY): Payer: Self-pay | Admitting: Emergency Medicine

## 2016-09-14 DIAGNOSIS — Z79899 Other long term (current) drug therapy: Secondary | ICD-10-CM | POA: Insufficient documentation

## 2016-09-14 DIAGNOSIS — E119 Type 2 diabetes mellitus without complications: Secondary | ICD-10-CM | POA: Insufficient documentation

## 2016-09-14 DIAGNOSIS — I2510 Atherosclerotic heart disease of native coronary artery without angina pectoris: Secondary | ICD-10-CM | POA: Diagnosis not present

## 2016-09-14 DIAGNOSIS — I480 Paroxysmal atrial fibrillation: Secondary | ICD-10-CM | POA: Diagnosis not present

## 2016-09-14 DIAGNOSIS — R079 Chest pain, unspecified: Secondary | ICD-10-CM

## 2016-09-14 DIAGNOSIS — Z87891 Personal history of nicotine dependence: Secondary | ICD-10-CM | POA: Diagnosis not present

## 2016-09-14 DIAGNOSIS — Z7982 Long term (current) use of aspirin: Secondary | ICD-10-CM | POA: Insufficient documentation

## 2016-09-14 DIAGNOSIS — Z7901 Long term (current) use of anticoagulants: Secondary | ICD-10-CM | POA: Diagnosis not present

## 2016-09-14 DIAGNOSIS — R002 Palpitations: Secondary | ICD-10-CM | POA: Diagnosis present

## 2016-09-14 DIAGNOSIS — Z85118 Personal history of other malignant neoplasm of bronchus and lung: Secondary | ICD-10-CM | POA: Insufficient documentation

## 2016-09-14 LAB — BASIC METABOLIC PANEL
Anion gap: 8 (ref 5–15)
BUN: 27 mg/dL — ABNORMAL HIGH (ref 6–20)
CO2: 27 mmol/L (ref 22–32)
Calcium: 9.3 mg/dL (ref 8.9–10.3)
Chloride: 104 mmol/L (ref 101–111)
Creatinine, Ser: 1.04 mg/dL — ABNORMAL HIGH (ref 0.44–1.00)
GFR calc Af Amer: >60 mL/min (ref >60)
GFR calc non Af Amer: 58 mL/min — ABNORMAL LOW (ref >60)
Glucose, Bld: 203 mg/dL — ABNORMAL HIGH (ref 65–99)
Potassium: 3.7 mmol/L (ref 3.5–5.1)
Sodium: 139 mmol/L (ref 135–145)

## 2016-09-14 LAB — CBC
HCT: 36.8 % (ref 36.0–46.0)
Hemoglobin: 12 g/dL (ref 12.0–15.0)
MCH: 30.5 pg (ref 26.0–34.0)
MCHC: 32.6 g/dL (ref 30.0–36.0)
MCV: 93.4 fL (ref 78.0–100.0)
Platelets: 295 10*3/uL (ref 150–400)
RBC: 3.94 MIL/uL (ref 3.87–5.11)
RDW: 12 % (ref 11.5–15.5)
WBC: 8 10*3/uL (ref 4.0–10.5)

## 2016-09-14 LAB — I-STAT TROPONIN, ED: Troponin i, poc: 0.01 ng/mL (ref 0.00–0.08)

## 2016-09-14 MED ORDER — METOPROLOL TARTRATE 25 MG PO TABS
25.0000 mg | ORAL_TABLET | Freq: Once | ORAL | Status: AC
  Administered 2016-09-14: 25 mg via ORAL
  Filled 2016-09-14: qty 1

## 2016-09-14 MED ORDER — METOPROLOL TARTRATE 25 MG PO TABS: 25.0000 mg | ORAL_TABLET | Freq: Two times a day (BID) | ORAL | 0 refills | Status: DC

## 2016-09-14 MED ORDER — METOPROLOL TARTRATE 50 MG PO TABS: 25.0000 mg | ORAL_TABLET | Freq: Two times a day (BID) | ORAL | 1 refills | Status: DC

## 2016-09-14 NOTE — ED Triage Notes (Signed)
C/o sharp pain to center of chest since last night that is worse with a deep breath.  Also reports sob.  Denies nausea and vomiting.  Seen in ED on 11/20 for CP

## 2016-09-14 NOTE — ED Provider Notes (Signed)
[facility] DEPT Provider Note   CSN: [id number] Arrival date & time: 09/14/16  0139     History   Chief Complaint Chief Complaint  Patient presents with  . Chest Pain    HPI [name] is a 60 y.o. female.  [name] is a 60 y.o. Female with a history of CVD, lung CA s/p chemo and radiation 17 years ago, aortic insufficiency, and hypertension who presents to the ED complaining of intermittent palpitations since last evening. Patient was seen in the emergency department 5 days ago with new onset paroxysmal atrial fibrillation with rapid ventricular rate. The patient received Cardizem and had resolution of her symptoms and was discharged home with Cardizem and Eliquis. Patient reports she is unable to fill the prescription for Cardizem. Patient reports last done on 7 PM she began having the palpitations again. She reports that do not seem to be as fast as they were when she came 5 days ago. She also complains of chest pain that has been constant for more than 5 days. It is not worse with exertion. She reports currently it is resolving. She reports remote history of lung cancer and pericarditis about 17 years ago. Patient denies fevers, lightheadedness, dizziness, significant, numbness, tingling, weakness, headache, shortness of breath, abdominal pain, nausea, vomiting or rashes.   The history is provided by the patient and medical records. No language interpreter was used.  Chest Pain   Associated symptoms include palpitations. Pertinent negatives include no abdominal pain, no back pain, no cough, no dizziness, no fever, no headaches, no nausea, no shortness of breath, no vomiting and no weakness.    Past Medical History:  Diagnosis Date  . Allergy   . Anemia   . Anemia   . Aortic insufficiency    a. Mod by echo 05/2015.  . Arthritis    "knees" (07/11/2015)  . 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 Complication of anesthesia    "I had a hard time waking me up w/one of my shoulder surgeries"  . Coronary artery disease    a. Abnormal stress test -> LHc 06/2015 s/p DES to mCX and mLAD, residual D2 disease treated medially.  . Elevated blood pressure   . Erythema nodosum   . Heart murmur   . Insomnia   . lung ca dx'd 10/1999   chemo/xrt comp 05/29/2000  . Myalgia   . Nephrolithiasis   . Obesity   . Pericardial effusion    a. 05/2015 Echo: EF 55-60%, no rwma, Gr 1 DD, no effusion - but an effusion was seen on CT which was felt to be increased in size compared to 12/2014 - pericardium also thickened.   . Pericarditis    a. Dx 05/2015.  [name] Pneumothorax, spontaneous, tension 01/20/2012   October, 2010  . Pulmonary fibrosis ([place]) 01/20/2012   Due to previous surgery and chest radiation for lung cancer  . Restrictive lung disease   . Type II diabetes mellitus [facility])     Patient Active Problem List   Diagnosis Date Noted  . Hyperlipidemia 08/07/2015  . CAD S/P percutaneous coronary angioplasty 07/25/2015  . Essential hypertension   . Aortic insufficiency   . Stented coronary artery   . Abnormal nuclear stress test 07/11/2015  . Abnormal stress test 07/04/2015  . Pericarditis   . Type II diabetes mellitus ([place])   . Pericardial effusion   .  Chest pain 05/30/2015  . 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 Open angle with borderline findings and low glaucoma risk in both eyes 05/23/2015  . Restrictive lung disease 03/14/2015  . Carcinoma of hilus of lung ([place]) 01/20/2012  . Insomnia   . Obesity   . Anemia   . Kidney stones   . UNDIAGNOSED CARDIAC MURMURS 02/24/2009  . Lung cancer ([place]) 09/20/2008  . DYSPNEA 09/20/2008  . DIABETES MELLITUS, TYPE II 06/19/2007    Past Surgical History:  Procedure Laterality Date  .  ABDOMINAL HYSTERECTOMY  2008  . APPENDECTOMY  1969?  [name] CARDIAC CATHETERIZATION N/A 07/11/2015   Procedure: Left Heart Cath and Coronary Angiography;  Surgeon: Jettie Booze, MD;  Location: [place] CV LAB;  Service: Cardiovascular;  Laterality: N/A;  . CARDIAC CATHETERIZATION  07/11/2015   Procedure: Coronary Stent Intervention;  Surgeon: Jettie Booze, MD;  Location: [place] CV LAB;  Service: Cardiovascular;;  . CARPAL TUNNEL RELEASE Bilateral 1998-2005?   right-left  . CARPAL TUNNEL RELEASE Left 2009?   "for the 2nd time"  . COLONOSCOPY    . CORONARY ANGIOPLASTY    . CYSTOSCOPY W/ STONE MANIPULATION  2013  . ESOPHAGOGASTRODUODENOSCOPY (EGD) WITH ESOPHAGEAL DILATION  2012  . FOOT SURGERY Bilateral 1980's   Callous removed   . INNER EAR SURGERY Right ~ 2009  . KNEE ARTHROPLASTY Right 1991  . KNEE ARTHROSCOPY  ~ 2000   LATERAL RELEASE  . LUNG CANCER SURGERY Left 2001   "small cell"  . LUNG SURGERY Right 2007   "reinflated it"  . PULMONARY EMBOLISM SURGERY  2007   LUNG COLLAPSE  . SHOULDER ADHESION RELEASE Right ~ 2004  . SHOULDER ARTHROSCOPY W/ ROTATOR CUFF REPAIR Right ~ 2003    OB History    No data available       Home Medications    Prior to Admission medications   Medication Sig Start Date End Date Taking? Authorizing Provider  aspirin EC 81 MG EC tablet Take 1 tablet (81 mg total) by mouth daily. 06/01/15  Yes Alyssa A Haney, MD  HUMALOG MIX 75/25 KWIKPEN (75-25) 100 UNIT/ML Kwikpen Inject 25-35 Units into the skin See admin instructions. Inject 35 units subque in Am and 25 units subque in PM 12/22/15  Yes Historical Provider, MD  losartan (COZAAR) 25 MG tablet Take 1 tablet (25 mg total) by mouth daily. 07/29/16  Yes Dorothy Spark, MD  rosuvastatin (CRESTOR) 10 MG tablet Take 1 tablet (10 mg total) by mouth daily. 07/29/16  Yes Dorothy Spark, MD  apixaban (ELIQUIS) 5 MG TABS tablet Take 1 tablet (5 mg total) by mouth 2 (two) times daily. Patient  not taking: Reported on 09/14/2016 29/52/84   Delora Fuel, MD  metoprolol (LOPRESSOR) 25 MG tablet Take 1 tablet (25 mg total) by mouth 2 (two) times daily. 09/14/16   Waynetta Pean, PA-C    Family History Family History  Problem Relation Age of Onset  . Heart disease Father   . Diabetes Father   . Pancreatic cancer Mother   . Diabetes Mother   . Hypertension Sister     x 3  . Diabetes Sister     x 4  . Diabetes Maternal Grandmother   . Diabetes Paternal Grandmother   . Colon cancer Neg Hx   . Colon polyps Neg Hx   . Rectal cancer Neg Hx   .  Stomach cancer Neg Hx   . Esophageal cancer Neg Hx     Social History Social History  Substance Use Topics  . Smoking status: Former Smoker    Packs/day: 0.25    Years: 5.00    Types: Cigarettes    Quit date: 07/22/1999  . Smokeless tobacco: Never Used  . Alcohol use No     Allergies   Hydrocodone; Morphine and related; Lisinopril; and Nsaids   Review of Systems Review of Systems  Constitutional: Negative for chills and fever.  HENT: Negative for congestion and sore throat.   Eyes: Negative for visual disturbance.  Respiratory: Negative for cough, shortness of breath and wheezing.   Cardiovascular: Positive for chest pain and palpitations. Negative for leg swelling.  Gastrointestinal: Negative for abdominal pain, diarrhea, nausea and vomiting.  Genitourinary: Negative for dysuria.  Musculoskeletal: Negative for back pain and neck pain.  Skin: Negative for rash.  Neurological: Negative for dizziness, syncope, weakness, light-headedness and headaches.     Physical Exam Updated Vital Signs BP 130/73   Pulse 79   Temp 97.6 F (36.4 C) (Oral)   Resp 21   Ht '5\' 8"'$  (1.727 m)   Wt 111.6 kg   SpO2 95%   BMI 37.40 kg/m   Physical Exam  Constitutional: She is oriented to person, place, and time. She appears well-developed and well-nourished. No distress.  Nontoxic appearing.  HENT:  Head: Normocephalic and atraumatic.   Mouth/Throat: Oropharynx is clear and moist.  Eyes: Conjunctivae are normal. Pupils are equal, round, and reactive to light. Right eye exhibits no discharge. Left eye exhibits no discharge.  Neck: Neck supple. No JVD present.  Cardiovascular: Normal rate, regular rhythm and intact distal pulses.  Exam reveals no gallop and no friction rub.   Murmur heard. Regular rhythm. Patient in sinus rhythm on monitor. Bilateral radial pulses are intact. Systolic murmur noted.  Pulmonary/Chest: Effort normal and breath sounds normal. No stridor. No respiratory distress. She has no wheezes. She has no rales.  Lungs clear to auscultation bilaterally.  Abdominal: Soft. There is no tenderness.  Musculoskeletal: Normal range of motion. She exhibits edema. She exhibits no tenderness.  Mild ankle edema bilaterally. No calf edema or tenderness.  Lymphadenopathy:    She has no cervical adenopathy.  Neurological: She is alert and oriented to person, place, and time. Coordination normal.  Skin: Skin is warm and dry. Capillary refill takes less than 2 seconds. No rash noted. She is not diaphoretic. No erythema. No pallor.  Psychiatric: She has a normal mood and affect. Her behavior is normal.  Nursing note and vitals reviewed.    ED Treatments / Results  Labs (all labs ordered are listed, but only abnormal results are displayed) Labs Reviewed  BASIC METABOLIC PANEL - Abnormal; Notable for the following:       Result Value   Glucose, Bld 203 (*)    BUN 27 (*)    Creatinine, Ser 1.04 (*)    GFR calc non Af Amer 58 (*)    All other components within normal limits  CBC  I-STAT TROPOININ, ED    EKG  EKG Interpretation  Date/Time:  Saturday September 14 2016 01:50:04 EST Ventricular Rate:  81 PR Interval:  138 QRS Duration: 86 QT Interval:  390 QTC Calculation: 453 R Axis:   61 Text Interpretation:  Normal sinus rhythm Normal ECG Confirmed by POLLINA  MD, CHRISTOPHER ([id number]) on 09/14/2016 6:52:10 AM        Radiology Dg Chest  2 View  Result Date: 09/14/2016 CLINICAL DATA:  Central chest pain radiating to the left arm. Shortness of breath and nausea for couple of hours. Cough for 1 week. History of hypertension and diabetes. EXAM: CHEST  2 VIEW COMPARISON:  09/09/2016 FINDINGS: Shallow inspiration with elevation of the left hemidiaphragm. Heart size and pulmonary vascularity are normal. Postoperative changes in the lungs. Partial left pneumonectomy with volume loss and scarring in the apex. Surgical clips in the right upper lung. No focal airspace disease or consolidation. No blunting of costophrenic angles. No pneumothorax. Tortuous aorta. Old resection or resorption of the distal right clavicle. Degenerative changes in the spine. IMPRESSION: Chronic findings in the chest are unchanged since prior study. No evidence of active pulmonary disease. Electronically Signed   By: Lucienne Capers M.D.   On: 09/14/2016 02:28    Procedures Procedures (including critical care time)  Medications Ordered in ED Medications  metoprolol tartrate (LOPRESSOR) tablet 25 mg (not administered)     Initial Impression / Assessment and Plan / ED Course  I have reviewed the triage vital signs and the nursing notes.  Pertinent labs & imaging results that were available during my care of the patient were reviewed by me and considered in my medical decision making (see chart for details).  Clinical Course    This is a 60 y.o. Female with a history of CVD, lung CA s/p chemo and radiation 17 years ago, aortic insufficiency, and hypertension who presents to the ED complaining of intermittent palpitations since last evening. Patient was seen in the emergency department 5 days ago with new onset paroxysmal atrial fibrillation with rapid ventricular rate. The patient received Cardizem and had resolution of her symptoms and was discharged home with Cardizem and Eliquis. Patient reports she is unable to fill the  prescription for Cardizem. Patient reports last done on 7 PM she began having the palpitations again. She reports that do not seem to be as fast as they were when she came 5 days ago. She also complains of chest pain that has been constant for more than 5 days. It is not worse with exertion. She reports currently the chest pain is resolving. She denies current palpitations.  On exam patient is afebrile nontoxic appearing. She is in sinus rhythm on the monitor. She denies current palpitations. EKG shows normal sinus rhythm. Chest x-ray shows chronic changes but no acute findings. Troponin is not elevated. As patient has had ongoing chest pain for more than 5 days and see no need for delta troponin. BMP is remarkable for a glucose of 203. CBC is within normal limits. I suspect patient is having palpitations from her paroxysmal atrial fibrillation. She was unable to obtain Cardizem due to insurance not covering it, and this would certainly contribute to her having more episodes of paroxysmal atrial fibrillation. She has not been in A. Fib while in the emergency department. She reports she's had no palpitations while here. After discussing with my attending, Dr. Betsey Holiday, will switch to Lopressor 25 mg BID, as this is on the [facility] $4 list and she will be able to obtain it even if her insurance refuses to cover this. Will provide with her first dose here. Patient encouraged to continue taking Eliquis. She has follow-up with her cardiologist in 3 days. I encouraged her to keep this appointment. I discussed strict and specific return precautions with the patient. I discussed if she has lightheadedness, dizziness, palpitations, feeling a fast heart rate or new chest pain she  needs to return to the emergency department immediately. I advised the patient to follow-up with their primary care provider this week. I advised the patient to return to the emergency department with new or worsening symptoms or new concerns. The  patient verbalized understanding and agreement with plan.    This patient was discussed with Dr. Betsey Holiday who agrees with assessment and plan.   Final Clinical Impressions(s) / ED Diagnoses   Final diagnoses:  PAF (paroxysmal atrial fibrillation) (HCC)  Nonspecific chest pain    New Prescriptions Current Discharge Medication List    START taking these medications   Details  metoprolol (LOPRESSOR) 25 MG tablet Take 1 tablet (25 mg total) by mouth 2 (two) times daily. Qty: 60 tablet, Refills: 0         Waynetta Pean, PA-C 09/14/16 [place], MD 09/16/16 0730

## 2016-09-17 ENCOUNTER — Encounter: Payer: Self-pay | Admitting: Cardiology

## 2016-09-17 ENCOUNTER — Encounter (INDEPENDENT_AMBULATORY_CARE_PROVIDER_SITE_OTHER): Payer: Self-pay

## 2016-09-17 ENCOUNTER — Ambulatory Visit (INDEPENDENT_AMBULATORY_CARE_PROVIDER_SITE_OTHER): Payer: Medicare Other | Admitting: Cardiology

## 2016-09-17 VITALS — BP 122/66 | HR 74 | Ht 67.5 in | Wt 256.0 lb

## 2016-09-17 DIAGNOSIS — Z9861 Coronary angioplasty status: Secondary | ICD-10-CM

## 2016-09-17 DIAGNOSIS — I2510 Atherosclerotic heart disease of native coronary artery without angina pectoris: Secondary | ICD-10-CM | POA: Diagnosis not present

## 2016-09-17 DIAGNOSIS — I3139 Other pericardial effusion (noninflammatory): Secondary | ICD-10-CM

## 2016-09-17 DIAGNOSIS — R079 Chest pain, unspecified: Secondary | ICD-10-CM | POA: Diagnosis not present

## 2016-09-17 DIAGNOSIS — I480 Paroxysmal atrial fibrillation: Secondary | ICD-10-CM

## 2016-09-17 DIAGNOSIS — I313 Pericardial effusion (noninflammatory): Secondary | ICD-10-CM | POA: Diagnosis not present

## 2016-09-17 LAB — T3, FREE: T3, Free: 2.8 pg/mL (ref 2.3–4.2)

## 2016-09-17 LAB — TSH: TSH: 3.99 mIU/L

## 2016-09-17 LAB — T4, FREE: Free T4: 0.9 ng/dL (ref 0.8–1.8)

## 2016-09-17 NOTE — Progress Notes (Signed)
09/17/2016 [name]   06-28-1956  [id number]  Primary Physician Jenny Reichmann, MD Primary Cardiologist: Dr. [name]   Reason for Visit/CC: Post ED F/u for New Onset Atrial Fibrillation   HPI:  [name], is a 60 y.o. female with a history of CAD s/p DES to [facility] and mLAD 07/11/15, chronic pericardial effusion, DM, HTN, remote h/o treated non-small cell lung cancer, pulmonary fibrosis, aortic insufficiency, COPD, anemia and mod AI by echo 05/2015. She has been followed by Dr. [name]. She presents to clinic today for post ED f/u after being seen on 09/09/16 with symptomatic new onset atrial fibrillation w/ RVR.   Records from that ED visit have been reviewed. EKG reviewed and afib was confirmed. CBC and BMP was unremarkable. No TSH was checked. Troponin was slightly abnormal at 0.03 x 1, felt to be due to demand ischemia. She converted to NSR in the ED after IV cardizem bolus and drip. She was released from the ED and did not require admission. She was discharged home with Rx for PO Cardizem and Eliquis for a/c, 5 mg BID. Her CHA2DS2 VASc score is at least 4 for HTN, DM, female sex and Vas dz. Her insurance did not approve Rx for Cardizem. This was changed to metoprolol 25 mg BID.  She presents back to clinic for f/u. No recurrent palpitations. Physical exam reveals RRR. HR and BP both stable. She is toleraing metoprolol and Eliquis ok w/o side effects. No abnormal bleeding. She does admit to exertional dyspnea, mild occasional chest pressure and mild orthopnea. She denies syncope/ near syncope. No LEE.  No symptoms of dysuria. She reports that she had a "cold" about 1 week prior to development of symptoms, which she treated with OTC Mucinex. URI symptoms have resolved.   Current Meds  Medication Sig  . apixaban (ELIQUIS) 5 MG TABS tablet Take 1 tablet (5 mg total) by mouth 2 (two) times daily.  [name] aspirin EC 81 MG EC tablet Take 1 tablet (81 mg total) by mouth daily.  . B-D UF III MINI PEN  NEEDLES 31G X 5 MM MISC 2 (two) times daily. as directed  . HUMALOG MIX 75/25 KWIKPEN (75-25) 100 UNIT/ML Kwikpen Inject 25-35 Units into the skin See admin instructions. Inject 35 units subque in Am and 25 units subque in PM  . hydrochlorothiazide (MICROZIDE) 12.5 MG capsule   . losartan (COZAAR) 25 MG tablet Take 1 tablet (25 mg total) by mouth daily.  . meloxicam (MOBIC) 15 MG tablet   . metoprolol (LOPRESSOR) 25 MG tablet Take 1 tablet (25 mg total) by mouth 2 (two) times daily.  . ONE TOUCH ULTRA TEST test strip   . rosuvastatin (CRESTOR) 10 MG tablet Take 1 tablet (10 mg total) by mouth daily.   Allergies  Allergen Reactions  . Hydrocodone Hives and Itching  . Morphine And Related Other (See Comments)    GI PROBLEMS  . Lisinopril Other (See Comments) and Cough    Cough   . Nsaids Other (See Comments)    GI PROBLEMS   Past Medical History:  Diagnosis Date  . Allergy   . Anemia   . Anemia   . Aortic insufficiency    a. Mod by echo 05/2015.  . Arthritis    "knees" (07/11/2015)  . 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 Complication of anesthesia    "I  had a hard time waking me up w/one of my shoulder surgeries"  . Coronary artery disease    a. Abnormal stress test -> LHc 06/2015 s/p DES to mCX and mLAD, residual D2 disease treated medially.  . Elevated blood pressure   . Erythema nodosum   . Heart murmur   . Insomnia   . lung ca dx'd 10/1999   chemo/xrt comp 05/29/2000  . Myalgia   . Nephrolithiasis   . Obesity   . Pericardial effusion    a. 05/2015 Echo: EF 55-60%, no rwma, Gr 1 DD, no effusion - but an effusion was seen on CT which was felt to be increased in size compared to 12/2014 - pericardium also thickened.   . Pericarditis    a. Dx 05/2015.  [name] Pneumothorax, spontaneous, tension 01/20/2012   October, 2010  . Pulmonary fibrosis ([place]) 01/20/2012   Due  to previous surgery and chest radiation for lung cancer  . Restrictive lung disease   . Type II diabetes mellitus (HCC)    Family History  Problem Relation Age of Onset  . Heart disease Father   . Diabetes Father   . Pancreatic cancer Mother   . Diabetes Mother   . Hypertension Sister     x 3  . Diabetes Sister     x 4  . Diabetes Maternal Grandmother   . Diabetes Paternal Grandmother   . Colon cancer Neg Hx   . Colon polyps Neg Hx   . Rectal cancer Neg Hx   . Stomach cancer Neg Hx   . Esophageal cancer Neg Hx    Past Surgical History:  Procedure Laterality Date  . ABDOMINAL HYSTERECTOMY  2008  . APPENDECTOMY  1969?  [name] CARDIAC CATHETERIZATION N/A 07/11/2015   Procedure: Left Heart Cath and Coronary Angiography;  Surgeon: Jettie Booze, MD;  Location: [place] CV LAB;  Service: Cardiovascular;  Laterality: N/A;  . CARDIAC CATHETERIZATION  07/11/2015   Procedure: Coronary Stent Intervention;  Surgeon: Jettie Booze, MD;  Location: [place] CV LAB;  Service: Cardiovascular;;  . CARPAL TUNNEL RELEASE Bilateral 1998-2005?   right-left  . CARPAL TUNNEL RELEASE Left 2009?   "for the 2nd time"  . COLONOSCOPY    . CORONARY ANGIOPLASTY    . CYSTOSCOPY W/ STONE MANIPULATION  2013  . ESOPHAGOGASTRODUODENOSCOPY (EGD) WITH ESOPHAGEAL DILATION  2012  . FOOT SURGERY Bilateral 1980's   Callous removed   . INNER EAR SURGERY Right ~ 2009  . KNEE ARTHROPLASTY Right 1991  . KNEE ARTHROSCOPY  ~ 2000   LATERAL RELEASE  . LUNG CANCER SURGERY Left 2001   "small cell"  . LUNG SURGERY Right 2007   "reinflated it"  . PULMONARY EMBOLISM SURGERY  2007   LUNG COLLAPSE  . SHOULDER ADHESION RELEASE Right ~ 2004  . SHOULDER ARTHROSCOPY W/ ROTATOR CUFF REPAIR Right ~ 2003   Social History   Social History  . Marital status: Single    Spouse name: N/A  . Number of children: 0  . Years of education: N/A   Occupational History  . RETIRED    Social History Main Topics  .  Smoking status: Former Smoker    Packs/day: 0.25    Years: 5.00    Types: Cigarettes    Quit date: 07/22/1999  . Smokeless tobacco: Never Used  . Alcohol use No  . Drug use: No  . Sexual activity: Not Currently    Birth control/ protection: Other-see comments  Comment: hysterectomy   Other Topics Concern  . Not on file   Social History Narrative  . No narrative on file     Review of Systems: General: negative for chills, fever, night sweats or weight changes.  Cardiovascular: negative for chest pain, dyspnea on exertion, edema, orthopnea, palpitations, paroxysmal nocturnal dyspnea or shortness of breath Dermatological: negative for rash Respiratory: negative for cough or wheezing Urologic: negative for hematuria Abdominal: negative for nausea, vomiting, diarrhea, bright red blood per rectum, melena, or hematemesis Neurologic: negative for visual changes, syncope, or dizziness All other systems reviewed and are otherwise negative except as noted above.   Physical Exam:  Height '5\' 8"'$  (1.727 m).  General appearance: alert, appears stated age, no distress and moderately obese Neck: no carotid bruit, no JVD, supple, symmetrical, trachea midline and thyroid not enlarged, symmetric, no tenderness/mass/nodules Lungs: clear to auscultation bilaterally Heart: regular rate and rhythm and 1/6 murmur at RUSB Extremities: extremities normal, atraumatic, no cyanosis or edema Pulses: 2+ and symmetric Skin: Skin color, texture, turgor normal. No rashes or lesions Neurologic: Grossly normal  EKG not performed. RRR on physical exam.   ASSESSMENT AND PLAN:   1. New Onset Atrial Fibrillation: spontaneous conversion to NSR on IV cardizem in the ED on 09/09/16. She denies any recurrence of palpitations. CBC and BMP in the ED was unremarkable. We will check thyroid function test, TSH and free T3/T4. We will also arrange for repeat 2D echo given h/o chronic pericardial effusion + recent  symptoms of exertional dyspnea/ orthopnea. We will also assess LVF and wall motion. Given CAD history and recent DOE + chest pressure, we will arrange for a NST to assess for ischemia as the cause for her new arrthymia. Continue metoprolol for rate control and Eliquis for a/c.  2. CAD: s/p PCI to [facility] and mLAD 06/2015. She has had mild exertional dyspnea and chest pressure + new development on an atrial arrhythmia. We will arrange NST to w/o coronary ischemia. Continue ASA, statin and BB.   3. H/o Pericardial Effusion: check c/u echo to assess for interval change given DOE, chest pressure and orthopnea.   4. HTN: Controlled on ARB and BB.   5. DM: per PCP.   6. HLD: Continue Crestor.   7. H/o Lung Cancer: Rx Surgery/RT/chemo dx January 2001   PLAN  W/u outlined above. Check labs, order 2D echo and NST. Keep f/u with Dr. [name] on 10/04/16.   Lyda Jester PA-C 09/17/2016 8:42 AM

## 2016-09-17 NOTE — Patient Instructions (Addendum)
Medication Instructions:  Your physician recommends that you continue on your current medications as directed. Please refer to the Current Medication list given to you today.   Labwork: TODAY:  TSH, FREE T3, & FREE T4  Testing/Procedures: .Your physician has requested that you have an echocardiogram (BEFORE 10/04/16)  Echocardiography is a painless test that uses sound waves to create images of your heart. It provides your doctor with information about the size and shape of your heart and how well your heart's chambers and valves are working. This procedure takes approximately one hour. There are no restrictions for this procedure.  Your physician has requested that you have a lexiscan myoview (BEFORE 10/04/16)  For further information please visit [email or web address]. Please follow instruction sheet, as given.    Follow-Up: Your physician recommends that you schedule a follow-up appointment in: [place] AS PLANNED   Any Other Special Instructions Will Be Listed Below (If Applicable). Pharmacologic Stress Electrocardiogram A pharmacologic stress electrocardiogram is a heart (cardiac) test that uses nuclear imaging to evaluate the blood supply to your heart. This test may also be called a pharmacologic stress electrocardiography. Pharmacologic means that a medicine is used to increase your heart rate and blood pressure.  This stress test is done to find areas of poor blood flow to the heart by determining the extent of coronary artery disease (CAD). Some people exercise on a treadmill, which naturally increases the blood flow to the heart. For those people unable to exercise on a treadmill, a medicine is used. This medicine stimulates your heart and will cause your heart to beat harder and more quickly, as if you were exercising.  Pharmacologic stress tests can help determine:  The adequacy of blood flow to your heart during increased levels of activity in order  to clear you for discharge home.  The extent of coronary artery blockage caused by CAD.  Your prognosis if you have suffered a heart attack.  The effectiveness of cardiac procedures done, such as an angioplasty, which can increase the circulation in your coronary arteries.  Causes of chest pain or pressure. LET [facility] CARE PROVIDER KNOW ABOUT:  Any allergies you have.  All medicines you are taking, including vitamins, herbs, eye drops, creams, and over-the-counter medicines.  Previous problems you or members of your family have had with the use of anesthetics.  Any blood disorders you have.  Previous surgeries you have had.  Medical conditions you have.  Possibility of pregnancy, if this applies.  If you are currently breastfeeding. RISKS AND COMPLICATIONS Generally, this is a safe procedure. However, as with any procedure, complications can occur. Possible complications include:  You develop pain or pressure in the following areas:  Chest.  Jaw or neck.  Between your shoulder blades.  Radiating down your left arm.  Headache.  Dizziness or light-headedness.  Shortness of breath.  Increased or irregular heartbeat.  Low blood pressure.  Nausea or vomiting.  Flushing.  Redness going up the arm and slight pain during injection of medicine.  Heart attack (rare). BEFORE THE PROCEDURE   Avoid all forms of caffeine for 24 hours before your test or as directed by your health care provider. This includes coffee, tea (even decaffeinated tea), caffeinated sodas, chocolate, cocoa, and certain pain medicines.  Follow your health care provider's instructions regarding eating and drinking before the test.  Take your medicines as directed at regular times with water unless instructed otherwise. Exceptions may include:  If you have  diabetes, ask how you are to take your insulin or pills. It is common to adjust insulin dosing the morning of the test.  If you are  taking beta-blocker medicines, it is important to talk to your health care provider about these medicines well before the date of your test. Taking beta-blocker medicines may interfere with the test. In some cases, these medicines need to be changed or stopped 24 hours or more before the test.  If you wear a nitroglycerin patch, it may need to be removed prior to the test. Ask your health care provider if the patch should be removed before the test.  If you use an inhaler for any breathing condition, bring it with you to the test.  If you are an outpatient, bring a snack so you can eat right after the stress phase of the test.  Do not smoke for 4 hours prior to the test or as directed by your health care provider.  Do not apply lotions, powders, creams, or oils on your chest prior to the test.  Wear comfortable shoes and clothing. Let your health care provider know if you were unable to complete or follow the preparations for your test. PROCEDURE   Multiple patches (electrodes) will be put on your chest. If needed, small areas of your chest may be shaved to get better contact with the electrodes. Once the electrodes are attached to your body, multiple wires will be attached to the electrodes, and your heart rate will be monitored.  An IV access will be started. A nuclear trace (isotope) is given. The isotope may be given intravenously, or it may be swallowed. Nuclear refers to several types of radioactive isotopes, and the nuclear isotope lights up the arteries so that the nuclear images are clear. The isotope is absorbed by your body. This results in low radiation exposure.  A resting nuclear image is taken to show how your heart functions at rest.  A medicine is given through the IV access.  A second scan is done about 1 hour after the medicine injection and determines how your heart functions under stress.  During this stress phase, you will be connected to an electrocardiogram machine.  Your blood pressure and oxygen levels will be monitored. AFTER THE PROCEDURE   Your heart rate and blood pressure will be monitored after the test.  You may return to your normal schedule, including diet,activities, and medicines, unless your health care provider tells you otherwise. This information is not intended to replace advice given to you by your health care provider. Make sure you discuss any questions you have with your health care provider. Document Released: 02/23/2009 Document Revised: 10/12/2013 Document Reviewed: 06/14/2013 Elsevier Interactive Patient Education  2017 [place]. Echocardiogram An echocardiogram, or echocardiography, uses sound waves (ultrasound) to produce an image of your heart. The echocardiogram is simple, painless, obtained within a short period of time, and offers valuable information to your health care provider. The images from an echocardiogram can provide information such as:  Evidence of coronary artery disease (CAD).  Heart size.  Heart muscle function.  Heart valve function.  Aneurysm detection.  Evidence of a past heart attack.  Fluid buildup around the heart.  Heart muscle thickening.  Assess heart valve function. Tell a health care provider about:  Any allergies you have.  All medicines you are taking, including vitamins, herbs, eye drops, creams, and over-the-counter medicines.  Any problems you or family members have had with anesthetic medicines.  Any blood  disorders you have.  Any surgeries you have had.  Any medical conditions you have.  Whether you are pregnant or may be pregnant. What happens before the procedure? No special preparation is needed. Eat and drink normally. What happens during the procedure?  In order to produce an image of your heart, gel will be applied to your chest and a wand-like tool (transducer) will be moved over your chest. The gel will help transmit the sound waves from the transducer.  The sound waves will harmlessly bounce off your heart to allow the heart images to be captured in real-time motion. These images will then be recorded.  You may need an IV to receive a medicine that improves the quality of the pictures. What happens after the procedure? You may return to your normal schedule including diet, activities, and medicines, unless your health care provider tells you otherwise. This information is not intended to replace advice given to you by your health care provider. Make sure you discuss any questions you have with your health care provider. Document Released: 10/04/2000 Document Revised: 05/25/2016 Document Reviewed: 06/14/2013 Elsevier Interactive Patient Education  2017 [employer].      If you need a refill on your cardiac medications before your next appointment, please call your pharmacy.

## 2016-09-18 ENCOUNTER — Other Ambulatory Visit: Payer: Self-pay

## 2016-09-18 ENCOUNTER — Encounter (INDEPENDENT_AMBULATORY_CARE_PROVIDER_SITE_OTHER): Payer: Self-pay

## 2016-09-18 ENCOUNTER — Ambulatory Visit (HOSPITAL_COMMUNITY): Payer: Medicare Other | Attending: Cardiology

## 2016-09-18 DIAGNOSIS — I480 Paroxysmal atrial fibrillation: Secondary | ICD-10-CM

## 2016-09-18 DIAGNOSIS — I351 Nonrheumatic aortic (valve) insufficiency: Secondary | ICD-10-CM | POA: Insufficient documentation

## 2016-09-18 DIAGNOSIS — I313 Pericardial effusion (noninflammatory): Secondary | ICD-10-CM

## 2016-09-18 DIAGNOSIS — I3139 Other pericardial effusion (noninflammatory): Secondary | ICD-10-CM

## 2016-09-24 ENCOUNTER — Telehealth (HOSPITAL_COMMUNITY): Payer: Self-pay | Admitting: *Deleted

## 2016-09-24 ENCOUNTER — Telehealth: Payer: Self-pay | Admitting: *Deleted

## 2016-09-24 NOTE — Telephone Encounter (Signed)
Follow Up:; ° ° °Returning your call. °

## 2016-09-24 NOTE — Telephone Encounter (Signed)
Left message on voicemail in reference to upcoming appointment scheduled for 09/26/16. Phone number given for a call back so details instructions can be given. Hasspacher, Ranae Palms

## 2016-09-25 ENCOUNTER — Ambulatory Visit
Admission: RE | Admit: 2016-09-25 | Discharge: 2016-09-25 | Disposition: A | Discharge status: Home / Self Care | Payer: Medicare Other | Source: Ambulatory Visit | Attending: Emergency Medicine | Admitting: Emergency Medicine

## 2016-09-25 DIAGNOSIS — Z1231 Encounter for screening mammogram for malignant neoplasm of breast: Secondary | ICD-10-CM

## 2016-09-26 ENCOUNTER — Ambulatory Visit (HOSPITAL_COMMUNITY): Payer: Medicare Other | Attending: Cardiology

## 2016-09-26 DIAGNOSIS — I2510 Atherosclerotic heart disease of native coronary artery without angina pectoris: Secondary | ICD-10-CM

## 2016-09-26 DIAGNOSIS — Z9861 Coronary angioplasty status: Secondary | ICD-10-CM

## 2016-09-26 DIAGNOSIS — R079 Chest pain, unspecified: Secondary | ICD-10-CM | POA: Diagnosis not present

## 2016-09-26 MED ORDER — REGADENOSON 0.4 MG/5ML IV SOLN
0.4000 mg | Freq: Once | INTRAVENOUS | Status: AC
  Administered 2016-09-26: 0.4 mg via INTRAVENOUS

## 2016-09-26 MED ORDER — TECHNETIUM TC 99M TETROFOSMIN IV KIT
33.0000 | PACK | Freq: Once | INTRAVENOUS | Status: AC | PRN
  Administered 2016-09-26: 33 via INTRAVENOUS
  Filled 2016-09-26: qty 33

## 2016-09-27 ENCOUNTER — Ambulatory Visit (HOSPITAL_COMMUNITY): Payer: Medicare Other | Attending: Internal Medicine

## 2016-09-27 LAB — MYOCARDIAL PERFUSION IMAGING
LV dias vol: 120 mL (ref 46–106)
LV sys vol: 46 mL
Peak HR: 100 {beats}/min
RATE: 0.24
Rest HR: 77 {beats}/min
SDS: 1
SRS: 5
SSS: 6
TID: 0.91

## 2016-09-27 MED ORDER — TECHNETIUM TC 99M TETROFOSMIN IV KIT
32.5000 | PACK | Freq: Once | INTRAVENOUS | Status: AC | PRN
  Administered 2016-09-27: 32.5 via INTRAVENOUS
  Filled 2016-09-27: qty 33

## 2016-09-28 ENCOUNTER — Ambulatory Visit (HOSPITAL_COMMUNITY)
Admission: EM | Admit: 2016-09-28 | Discharge: 2016-09-28 | Disposition: A | Discharge status: Other Acute Inpatient Hospital | Payer: Medicare Other

## 2016-10-01 NOTE — Telephone Encounter (Signed)
Patient was contacted with Darcella Cheshire, PharmD candidate. I agree with the assessment and plan of care documented.  [facility] pharmacist and he states the free trial card was successful. Patient notified.

## 2016-10-04 ENCOUNTER — Ambulatory Visit
Admission: RE | Admit: 2016-10-04 | Discharge: 2016-10-04 | Disposition: A | Discharge status: Home / Self Care | Payer: Medicare Other | Source: Ambulatory Visit | Attending: Cardiology | Admitting: Cardiology

## 2016-10-04 ENCOUNTER — Ambulatory Visit (INDEPENDENT_AMBULATORY_CARE_PROVIDER_SITE_OTHER): Payer: Medicare Other | Admitting: Cardiology

## 2016-10-04 ENCOUNTER — Encounter: Payer: Self-pay | Admitting: Cardiology

## 2016-10-04 VITALS — BP 126/74 | HR 71 | Ht 67.5 in | Wt 261.0 lb

## 2016-10-04 DIAGNOSIS — I10 Essential (primary) hypertension: Secondary | ICD-10-CM

## 2016-10-04 DIAGNOSIS — Z79899 Other long term (current) drug therapy: Secondary | ICD-10-CM

## 2016-10-04 DIAGNOSIS — R05 Cough: Secondary | ICD-10-CM

## 2016-10-04 DIAGNOSIS — I5033 Acute on chronic diastolic (congestive) heart failure: Secondary | ICD-10-CM

## 2016-10-04 DIAGNOSIS — E782 Mixed hyperlipidemia: Secondary | ICD-10-CM

## 2016-10-04 DIAGNOSIS — I480 Paroxysmal atrial fibrillation: Secondary | ICD-10-CM

## 2016-10-04 DIAGNOSIS — R059 Cough, unspecified: Secondary | ICD-10-CM

## 2016-10-04 DIAGNOSIS — I313 Pericardial effusion (noninflammatory): Secondary | ICD-10-CM

## 2016-10-04 DIAGNOSIS — I2510 Atherosclerotic heart disease of native coronary artery without angina pectoris: Secondary | ICD-10-CM

## 2016-10-04 DIAGNOSIS — I3139 Other pericardial effusion (noninflammatory): Secondary | ICD-10-CM

## 2016-10-04 MED ORDER — FUROSEMIDE 20 MG PO TABS: 20.0000 mg | ORAL_TABLET | Freq: Every day | ORAL | 3 refills | Status: DC

## 2016-10-04 NOTE — Patient Instructions (Signed)
Medication Instructions:   START TAKING LASIX 20 MG ONCE DAILY    Testing/Procedures:  A chest x-ray takes a picture of the organs and structures inside the chest, including the heart, lungs, and blood vessels. This test can show several things, including, whether the heart is enlarges; whether fluid is building up in the lungs; and whether pacemaker / defibrillator leads are still in place.  PLEASE GO AND GET YOUR CHEST X-RAY DONE TODAY AT [facility] [place]:  2 MONTHS WITH DR [name]       If you need a refill on your cardiac medications before your next appointment, please call your pharmacy.

## 2016-10-04 NOTE — Progress Notes (Signed)
10/04/2016 [name]   Oct 22, 1955  [id number]  Primary Physician Jenny Reichmann, MD Primary Cardiologist: Dr. [name]   Reason for Visit/CC: Post ED F/u for New Onset Atrial Fibrillation   HPI:  Ms. Barlowe, is a 60 y.o. female with a history of CAD s/p DES to [facility] and mLAD 07/11/15, chronic pericardial effusion, DM, HTN, remote h/o treated non-small cell lung cancer, pulmonary fibrosis, aortic insufficiency, COPD, anemia and mod AI by echo 05/2015. She has been followed by Dr. [name]. She presents to clinic today for post ED f/u after being seen on 09/09/16 with symptomatic new onset atrial fibrillation w/ RVR.   Records from that ED visit have been reviewed. EKG reviewed and afib was confirmed. CBC and BMP was unremarkable. No TSH was checked. Troponin was slightly abnormal at 0.03 x 1, felt to be due to demand ischemia. She converted to NSR in the ED after IV cardizem bolus and drip. She was released from the ED and did not require admission. She was discharged home with Rx for PO Cardizem and Eliquis for a/c, 5 mg BID. Her CHA2DS2 VASc score is at least 4 for HTN, DM, female sex and Vas dz. Her insurance did not approve Rx for Cardizem. This was changed to metoprolol 25 mg BID.  10/04/2016 - this is a 2 week follow-up after an episode of atrial fibrillation with RVR that prompted an ER visit. She denies any palpitations since then. No chest pain or shortness of breath. She has developed cough that is mildly productive will feel sputum about 2 weeks ago. She has no fever or chills. No dizziness or syncope. She has been compliant with her meds other than pelvic with that she just refilled yesterday. She has no lower extremity edema but 3 pillow orthopnea.  Current Meds  Medication Sig  . aspirin EC 81 MG EC tablet Take 1 tablet (81 mg total) by mouth daily.  . B-D UF III MINI PEN NEEDLES 31G X 5 MM MISC 2 (two) times daily. as directed  . diltiazem (CARDIZEM SR) 60 MG 12 hr capsule Take 60 mg  by mouth 2 (two) times daily.  [name] HUMALOG MIX 75/25 KWIKPEN (75-25) 100 UNIT/ML Kwikpen Inject 25-35 Units into the skin See admin instructions. Inject 35 units subque in Am and 25 units subque in PM  . hydrochlorothiazide (MICROZIDE) 12.5 MG capsule   . losartan (COZAAR) 25 MG tablet Take 1 tablet (25 mg total) by mouth daily.  . meloxicam (MOBIC) 15 MG tablet   . metoprolol (LOPRESSOR) 25 MG tablet Take 1 tablet (25 mg total) by mouth 2 (two) times daily.  . ONE TOUCH ULTRA TEST test strip   . rosuvastatin (CRESTOR) 10 MG tablet Take 1 tablet (10 mg total) by mouth daily.   Allergies  Allergen Reactions  . Hydrocodone Hives and Itching  . Morphine And Related Other (See Comments)    GI PROBLEMS  . Lisinopril Other (See Comments) and Cough    Cough   . Nsaids Other (See Comments)    GI PROBLEMS   Past Medical History:  Diagnosis Date  . Allergy   . Anemia   . Anemia   . Aortic insufficiency    a. Mod by echo 05/2015.  . Arthritis    "knees" (07/11/2015)  . 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 Complication of anesthesia    "  I had a hard time waking me up w/one of my shoulder surgeries"  . Coronary artery disease    a. Abnormal stress test -> LHc 06/2015 s/p DES to [facility] and mLAD, residual D2 disease treated medially.  . Elevated blood pressure   . Erythema nodosum   . Heart murmur   . Insomnia   . lung ca dx'd 10/1999   chemo/xrt comp 05/29/2000  . Myalgia   . Nephrolithiasis   . Obesity   . Pericardial effusion    a. 05/2015 Echo: EF 55-60%, no rwma, Gr 1 DD, no effusion - but an effusion was seen on CT which was felt to be increased in size compared to 12/2014 - pericardium also thickened.   . Pericarditis    a. Dx 05/2015.  [name] Pneumothorax, spontaneous, tension 01/20/2012   October, 2010  . Pulmonary fibrosis ([place]) 01/20/2012   Due to previous surgery and  chest radiation for lung cancer  . Restrictive lung disease   . Type II diabetes mellitus (HCC)    Family History  Problem Relation Age of Onset  . Heart disease Father   . Diabetes Father   . Pancreatic cancer Mother   . Diabetes Mother   . Hypertension Sister     x 3  . Diabetes Sister     x 4  . Diabetes Maternal Grandmother   . Diabetes Paternal Grandmother   . Colon cancer Neg Hx   . Colon polyps Neg Hx   . Rectal cancer Neg Hx   . Stomach cancer Neg Hx   . Esophageal cancer Neg Hx    Past Surgical History:  Procedure Laterality Date  . ABDOMINAL HYSTERECTOMY  2008  . APPENDECTOMY  1969?  [name] CARDIAC CATHETERIZATION N/A 07/11/2015   Procedure: Left Heart Cath and Coronary Angiography;  Surgeon: Jettie Booze, MD;  Location: [place] [facility];  Service: Cardiovascular;  Laterality: N/A;  . CARDIAC CATHETERIZATION  07/11/2015   Procedure: Coronary Stent Intervention;  Surgeon: Jettie Booze, MD;  Location: [place] [facility];  Service: Cardiovascular;;  . CARPAL TUNNEL RELEASE Bilateral 1998-2005?   right-left  . CARPAL TUNNEL RELEASE Left 2009?   "for the 2nd time"  . COLONOSCOPY    . CORONARY ANGIOPLASTY    . CYSTOSCOPY W/ STONE MANIPULATION  2013  . ESOPHAGOGASTRODUODENOSCOPY (EGD) WITH ESOPHAGEAL DILATION  2012  . FOOT SURGERY Bilateral 1980's   Callous removed   . INNER EAR SURGERY Right ~ 2009  . KNEE ARTHROPLASTY Right 1991  . KNEE ARTHROSCOPY  ~ 2000   LATERAL RELEASE  . LUNG CANCER SURGERY Left 2001   "small cell"  . LUNG SURGERY Right 2007   "reinflated it"  . PULMONARY EMBOLISM SURGERY  2007   LUNG COLLAPSE  . SHOULDER ADHESION RELEASE Right ~ 2004  . SHOULDER ARTHROSCOPY W/ ROTATOR CUFF REPAIR Right ~ 2003   Social History   Social History  . Marital status: Single    Spouse name: N/A  . Number of children: 0  . Years of education: N/A   Occupational History  . RETIRED    Social History Main Topics  . Smoking status: Former  Smoker    Packs/day: 0.25    Years: 5.00    Types: Cigarettes    Quit date: 07/22/1999  . Smokeless tobacco: Never Used  . Alcohol use No  . Drug use: No  . Sexual activity: Not Currently    Birth control/ protection: Other-see comments  Comment: hysterectomy   Other Topics Concern  . Not on file   Social History Narrative  . No narrative on file     Review of Systems: General: negative for chills, fever, night sweats or weight changes.  Cardiovascular: negative for chest pain, dyspnea on exertion, edema, orthopnea, palpitations, paroxysmal nocturnal dyspnea or shortness of breath Dermatological: negative for rash Respiratory: negative for cough or wheezing Urologic: negative for hematuria Abdominal: negative for nausea, vomiting, diarrhea, bright red blood per rectum, melena, or hematemesis Neurologic: negative for visual changes, syncope, or dizziness All other systems reviewed and are otherwise negative except as noted above.   Physical Exam:  Blood pressure 126/74, pulse 71, height 5' 7.5" (1.715 m), weight 261 lb (118.4 kg), SpO2 95 %.  General appearance: alert, appears stated age, no distress and moderately obese Neck: no carotid bruit, no JVD, supple, symmetrical, trachea midline and thyroid not enlarged, symmetric, no tenderness/mass/nodules Lungs: Decreased breathing sounds and mild wheezing at the left base. Heart: regular rate and rhythm and 1/6 murmur at RUSB Extremities: extremities normal, atraumatic, no cyanosis or edema Pulses: 2+ and symmetric Skin: Skin color, texture, turgor normal. No rashes or lesions Neurologic: Grossly normal  EKG not performed. RRR on physical exam.   Nuclear stress test: 11/29/2015  Nuclear stress EF: 62%.  There is a small defect of mild severity present in the mid anteroseptal location. The defect is non-reversible and consistent with breast attenuation artifact vs. prior infarct. No ischemia noted.  This is a low risk  study.  The left ventricular ejection fraction is normal (55-65%).  Compared to study of 2016, there is now normal perfusion in the apical anterior and apical myocardium and the defect in the mid anteroseptum is fixed.  TTE: 09/18/2016  - Left ventricle: The cavity size was normal. Wall thickness was   normal. Systolic function was normal. The estimated ejection   fraction was in the range of 55% to 60%. Wall motion was normal;   there were no regional wall motion abnormalities. Doppler   parameters are consistent with abnormal left ventricular   relaxation (grade 1 diastolic dysfunction). - Aortic valve: There was no stenosis. There was mild to moderate   regurgitation. - Mitral valve: There was trivial regurgitation. - Right ventricle: The cavity size was normal. Systolic function   was normal. - Right atrium: The atrium was mildly dilated. - Pulmonic valve: Turbulence across the pulmonic valve but peak   gradient 15 mmHg does not suggest significant stenosis. There was   mild regurgitation. Left atrium:  The atrium was normal in size. - Pulmonary arteries: PA peak pressure: 28 mm Hg (S). - Inferior vena cava: The vessel was normal in size. The   respirophasic diameter changes were in the normal range (>= 50%),   consistent with normal central venous pressure. - Pericardium, extracardiac: Small pericardial effusion, no   tamponade.  Impressions: - Normal LV size with EF 55-60%. Normal RV size and systolic   function. Mild to moderate aortic insufficiency. Turbulence   across the pulmonic valve but no significant stenosis by doppler   measurement.    ASSESSMENT AND PLAN:   1. New Onset Atrial Fibrillation: spontaneous conversion to NSR on IV cardizem in the ED on 09/09/16. No recurrent palpitations. Normal TSH.  CHADS-VASc 3, encouraged to take Eliquis for a/c. Also continue metoprolol and diltiazem. She underwent an echocardiogram in November 2017 that showed normal  LVEF 86-76%, grade 1 diastolic dysfunction mild to moderate AI normal left  atrial size and no pulmonary hypertension.  2. CAD: s/p PCI to [facility] and mLAD 06/2015. She has had mild exertional dyspnea and chest pressure + new development on an atrial arrhythmia. She underwent nuclear stress test on 09/27/2016 was negative for ischemia and showed normal LVEF. Continue ASA, statin and BB.   3. H/o Pericardial Effusion: Repeat echocardiogram shows only small amount of Procardia fusion with no signs of tamponade.   4. HTN: Controlled.  5. Acute on chronic diastolic CHF  - added Lasix 20 mg daily.   6. Productive cough with no fever or chills, will obtain chest x-ray to rule out pneumonia.  7. HLD: Continue Crestor.   8. H/o Lung Cancer: Rx Surgery/RT/chemo dx January 2001   PLAN  Follow up in 2 months.  Ena Dawley PA-C 10/04/2016 9:22 AM

## 2016-10-18 ENCOUNTER — Telehealth: Payer: Self-pay | Admitting: *Deleted

## 2016-10-18 MED ORDER — METOPROLOL TARTRATE 25 MG PO TABS: 25.0000 mg | ORAL_TABLET | Freq: Two times a day (BID) | ORAL | 2 refills | Status: DC

## 2016-10-18 MED ORDER — DILTIAZEM HCL ER 60 MG PO CP12: 60.0000 mg | ORAL_CAPSULE | Freq: Two times a day (BID) | ORAL | 2 refills | Status: DC

## 2016-10-18 NOTE — Telephone Encounter (Signed)
Pt walked in to request for a 90 day supply/ [facility]  Pt requesting refills on both metoprolol and cardizem.  Pt forgot to request refills at her last OV with Dr [name] on 12/15.  Informed the pt that both [facility] [address].

## 2016-10-23 ENCOUNTER — Telehealth: Payer: Self-pay | Admitting: Cardiology

## 2016-10-23 MED ORDER — DILTIAZEM HCL ER COATED BEADS 120 MG PO CP24: 120.0000 mg | ORAL_CAPSULE | Freq: Every day | ORAL | 3 refills | Status: DC

## 2016-10-23 NOTE — Telephone Encounter (Signed)
New message     Pt c/o medication issue:  1. Name of Medication: diltiazen  2. How are you currently taking this medication (dosage and times per day)?  '60mg'$  SR  -----bid  3. Are you having a reaction (difficulty breathing--STAT)? no 4. What is your medication issue?  This medication is on indefinite backorder.  Can pharmacy substitute for diltiazem '120mg'$  extended---1 tab daily

## 2016-10-23 NOTE — Telephone Encounter (Signed)
Spoke with Truddie Crumble, Pharmacist at [facility] to inform him that I went to speak with our PharmD Jinny Blossom Supple, and new orders to switch the pts Cardizem SR to Cardizem CD.  Informed Truddie Crumble that given the indefinite backorder of diltiazem (Cardizem SR) 60 mg, the pt should be switched to Diltiazem (Cardizem CD) 120 mg po daily.  Telephone order provided to [facility], Pharmacist at [facility], to switch the pts regimen to diltiazem (Cardizem CD) 120 mg po daily.  [facility] to provide this pt with a 90 day supply of this medication.  Truddie Crumble verbalized understanding and agrees to this plan.  Truddie Crumble appreciative for the call back.  Truddie Crumble will endorse this change to the pt.

## 2016-11-21 ENCOUNTER — Other Ambulatory Visit: Payer: Self-pay | Admitting: Cardiology

## 2016-12-05 ENCOUNTER — Encounter (INDEPENDENT_AMBULATORY_CARE_PROVIDER_SITE_OTHER): Payer: Self-pay

## 2016-12-05 ENCOUNTER — Ambulatory Visit (INDEPENDENT_AMBULATORY_CARE_PROVIDER_SITE_OTHER): Payer: Medicare Other | Admitting: Cardiology

## 2016-12-05 VITALS — BP 128/64 | HR 72 | Ht 67.5 in | Wt 257.0 lb

## 2016-12-05 DIAGNOSIS — I480 Paroxysmal atrial fibrillation: Secondary | ICD-10-CM

## 2016-12-05 DIAGNOSIS — I313 Pericardial effusion (noninflammatory): Secondary | ICD-10-CM | POA: Diagnosis not present

## 2016-12-05 DIAGNOSIS — I3139 Other pericardial effusion (noninflammatory): Secondary | ICD-10-CM

## 2016-12-05 DIAGNOSIS — Z9861 Coronary angioplasty status: Secondary | ICD-10-CM

## 2016-12-05 DIAGNOSIS — E782 Mixed hyperlipidemia: Secondary | ICD-10-CM

## 2016-12-05 DIAGNOSIS — I300 Acute nonspecific idiopathic pericarditis: Secondary | ICD-10-CM

## 2016-12-05 DIAGNOSIS — I2510 Atherosclerotic heart disease of native coronary artery without angina pectoris: Secondary | ICD-10-CM

## 2016-12-05 DIAGNOSIS — I10 Essential (primary) hypertension: Secondary | ICD-10-CM | POA: Diagnosis not present

## 2016-12-05 MED ORDER — IBUPROFEN 400 MG PO TABS: 400.0000 mg | ORAL_TABLET | Freq: Two times a day (BID) | ORAL | 0 refills | Status: DC

## 2016-12-05 MED ORDER — APIXABAN 5 MG PO TABS: 5.0000 mg | ORAL_TABLET | Freq: Two times a day (BID) | ORAL | 1 refills | Status: DC

## 2016-12-05 MED ORDER — COLCHICINE 0.6 MG PO TABS: 0.6000 mg | ORAL_TABLET | Freq: Two times a day (BID) | ORAL | 0 refills | Status: DC

## 2016-12-05 NOTE — Progress Notes (Addendum)
12/05/2016 [name]   31-Dec-1955  [id number]  Primary Physician Jenny Reichmann, MD Primary Cardiologist: Dr. [name]   Reason for Visit/CC: Post ED F/u for New Onset Atrial Fibrillation   HPI:  [name], is a 61 y.o. female with a history of CAD s/p DES to [facility] and mLAD 07/11/15, chronic pericardial effusion, DM, HTN, remote h/o treated non-small cell lung cancer, pulmonary fibrosis, aortic insufficiency, COPD, anemia and mod AI by echo 05/2015. She has been followed by Dr. [name]. She presents to clinic today for post ED f/u after being seen on 09/09/16 with symptomatic new onset atrial fibrillation w/ RVR.   Records from that ED visit have been reviewed. EKG reviewed and afib was confirmed. CBC and BMP was unremarkable. No TSH was checked. Troponin was slightly abnormal at 0.03 x 1, felt to be due to demand ischemia. She converted to NSR in the ED after IV cardizem bolus and drip. She was released from the ED and did not require admission. She was discharged home with Rx for PO Cardizem and Eliquis for a/c, 5 mg BID. Her CHA2DS2 VASc score is at least 4 for HTN, DM, female sex and Vas dz. Her insurance did not approve Rx for Cardizem. This was changed to metoprolol 25 mg BID.  10/04/2016 - this is a 2 week follow-up after an episode of atrial fibrillation with RVR that prompted an ER visit. She denies any palpitations since then. No chest pain or shortness of breath. She has developed cough that is mildly productive will feel sputum about 2 weeks ago. She has no fever or chills. No dizziness or syncope. She has been compliant with her meds other than pelvic with that she just refilled yesterday. She has no lower extremity edema but 3 pillow orthopnea.  12/05/2016 - the patient is coming after 2 months, she states that her chest pain has worsened, its worse when she lays on her right side and better when she leans forward. She also has exertional dyspnea. Denies palpitations, no fever, chills,  cough, no LE edema.   Current Meds  Medication Sig  . apixaban (ELIQUIS) 5 MG TABS tablet Take 1 tablet (5 mg total) by mouth 2 (two) times daily.  [name] aspirin EC 81 MG EC tablet Take 1 tablet (81 mg total) by mouth daily.  . B-D UF III MINI PEN NEEDLES 31G X 5 MM MISC 2 (two) times daily. as directed  . diltiazem (CARDIZEM CD) 120 MG 24 hr capsule Take 1 capsule (120 mg total) by mouth daily.  . furosemide (LASIX) 20 MG tablet Take 1 tablet (20 mg total) by mouth daily.  [name] HUMALOG MIX 75/25 KWIKPEN (75-25) 100 UNIT/ML Kwikpen Inject 25-35 Units into the skin See admin instructions. Inject 35 units subque in Am and 25 units subque in PM  . hydrochlorothiazide (MICROZIDE) 12.5 MG capsule   . losartan (COZAAR) 25 MG tablet Take 1 tablet (25 mg total) by mouth daily.  . meloxicam (MOBIC) 15 MG tablet   . metoprolol tartrate (LOPRESSOR) 25 MG tablet Take 1 tablet (25 mg total) by mouth 2 (two) times daily.  . ONE TOUCH ULTRA TEST test strip   . rosuvastatin (CRESTOR) 10 MG tablet Take 1 tablet (10 mg total) by mouth daily.   Allergies  Allergen Reactions  . Hydrocodone Hives and Itching  . Morphine And Related Other (See Comments)    GI PROBLEMS  . Lisinopril Other (See Comments) and Cough    Cough   .  Nsaids Other (See Comments)    GI PROBLEMS   Past Medical History:  Diagnosis Date  . Allergy   . Anemia   . Anemia   . Aortic insufficiency    a. Mod by echo 05/2015.  . Arthritis    "knees" (07/11/2015)  . 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 Complication of anesthesia    "I had a hard time waking me up w/one of my shoulder surgeries"  . Coronary artery disease    a. Abnormal stress test -> LHc 06/2015 s/p DES to [facility] and mLAD, residual D2 disease treated medially.  . Elevated blood pressure   . Erythema nodosum   . Heart murmur   . Insomnia   . lung ca dx'd  10/1999   chemo/xrt comp 05/29/2000  . Myalgia   . Nephrolithiasis   . Obesity   . Pericardial effusion    a. 05/2015 Echo: EF 55-60%, no rwma, Gr 1 DD, no effusion - but an effusion was seen on CT which was felt to be increased in size compared to 12/2014 - pericardium also thickened.   . Pericarditis    a. Dx 05/2015.  [name] Pneumothorax, spontaneous, tension 01/20/2012   October, 2010  . Pulmonary fibrosis ([place]) 01/20/2012   Due to previous surgery and chest radiation for lung cancer  . Restrictive lung disease   . Type II diabetes mellitus (HCC)    Family History  Problem Relation Age of Onset  . Heart disease Father   . Diabetes Father   . Pancreatic cancer Mother   . Diabetes Mother   . Hypertension Sister     x 3  . Diabetes Sister     x 4  . Diabetes Maternal Grandmother   . Diabetes Paternal Grandmother   . Colon cancer Neg Hx   . Colon polyps Neg Hx   . Rectal cancer Neg Hx   . Stomach cancer Neg Hx   . Esophageal cancer Neg Hx    Past Surgical History:  Procedure Laterality Date  . ABDOMINAL HYSTERECTOMY  2008  . APPENDECTOMY  1969?  [name] CARDIAC CATHETERIZATION N/A 07/11/2015   Procedure: Left Heart Cath and Coronary Angiography;  Surgeon: Jettie Booze, MD;  Location: [facility];  Service: Cardiovascular;  Laterality: N/A;  . CARDIAC CATHETERIZATION  07/11/2015   Procedure: Coronary Stent Intervention;  Surgeon: Jettie Booze, MD;  Location: [place] [facility];  Service: Cardiovascular;;  . CARPAL TUNNEL RELEASE Bilateral 1998-2005?   right-left  . CARPAL TUNNEL RELEASE Left 2009?   "for the 2nd time"  . COLONOSCOPY    . CORONARY ANGIOPLASTY    . CYSTOSCOPY W/ STONE MANIPULATION  2013  . ESOPHAGOGASTRODUODENOSCOPY (EGD) WITH ESOPHAGEAL DILATION  2012  . FOOT SURGERY Bilateral 1980's   Callous removed   . INNER EAR SURGERY Right ~ 2009  . KNEE ARTHROPLASTY Right 1991  . KNEE ARTHROSCOPY  ~ 2000   LATERAL RELEASE  . LUNG CANCER SURGERY Left 2001    "small cell"  . LUNG SURGERY Right 2007   "reinflated it"  . PULMONARY EMBOLISM SURGERY  2007   LUNG COLLAPSE  . SHOULDER ADHESION RELEASE Right ~ 2004  . SHOULDER ARTHROSCOPY W/ ROTATOR CUFF REPAIR Right ~ 2003   Social History   Social History  . Marital status: Single    Spouse name: N/A  . Number  of children: 0  . Years of education: N/A   Occupational History  . RETIRED    Social History Main Topics  . Smoking status: Former Smoker    Packs/day: 0.25    Years: 5.00    Types: Cigarettes    Quit date: 07/22/1999  . Smokeless tobacco: Never Used  . Alcohol use No  . Drug use: No  . Sexual activity: Not Currently    Birth control/ protection: Other-see comments     Comment: hysterectomy   Other Topics Concern  . Not on file   Social History Narrative  . No narrative on file     Review of Systems: General: negative for chills, fever, night sweats or weight changes.  Cardiovascular: negative for chest pain, dyspnea on exertion, edema, orthopnea, palpitations, paroxysmal nocturnal dyspnea or shortness of breath Dermatological: negative for rash Respiratory: negative for cough or wheezing Urologic: negative for hematuria Abdominal: negative for nausea, vomiting, diarrhea, bright red blood per rectum, melena, or hematemesis Neurologic: negative for visual changes, syncope, or dizziness All other systems reviewed and are otherwise negative except as noted above.   Physical Exam:  Blood pressure 128/64, pulse 72, height 5' 7.5" (1.715 m), weight 257 lb (116.6 kg).  General appearance: alert, appears stated age, no distress and moderately obese Neck: no carotid bruit, no JVD, supple, symmetrical, trachea midline and thyroid not enlarged, symmetric, no tenderness/mass/nodules Lungs: Decreased breathing sounds and mild wheezing at the left base. Heart: regular rate and rhythm and 1/6 murmur at RUSB, systolic and diastolic rub Extremities: extremities normal, atraumatic,  no cyanosis or edema Pulses: 2+ and symmetric Skin: Skin color, texture, turgor normal. No rashes or lesions Neurologic: Grossly normal  EKG not performed. RRR on physical exam.   Nuclear stress test: 11/29/2015  Nuclear stress EF: 62%.  There is a small defect of mild severity present in the mid anteroseptal location. The defect is non-reversible and consistent with breast attenuation artifact vs. prior infarct. No ischemia noted.  This is a low risk study.  The left ventricular ejection fraction is normal (55-65%).  Compared to study of 2016, there is now normal perfusion in the apical anterior and apical myocardium and the defect in the mid anteroseptum is fixed.  TTE: 09/18/2016  - Left ventricle: The cavity size was normal. Wall thickness was   normal. Systolic function was normal. The estimated ejection   fraction was in the range of 55% to 60%. Wall motion was normal;   there were no regional wall motion abnormalities. Doppler   parameters are consistent with abnormal left ventricular   relaxation (grade 1 diastolic dysfunction). - Aortic valve: There was no stenosis. There was mild to moderate   regurgitation. - Mitral valve: There was trivial regurgitation. - Right ventricle: The cavity size was normal. Systolic function   was normal. - Right atrium: The atrium was mildly dilated. - Pulmonic valve: Turbulence across the pulmonic valve but peak   gradient 15 mmHg does not suggest significant stenosis. There was   mild regurgitation. Left atrium:  The atrium was normal in size. - Pulmonary arteries: PA peak pressure: 28 mm Hg (S). - Inferior vena cava: The vessel was normal in size. The   respirophasic diameter changes were in the normal range (>= 50%),   consistent with normal central venous pressure. - Pericardium, extracardiac: Small pericardial effusion, no   tamponade.  Impressions: - Normal LV size with EF 55-60%. Normal RV size and systolic   function. Mild  to  moderate aortic insufficiency. Turbulence   across the pulmonic valve but no significant stenosis by doppler   measurement.    ASSESSMENT AND PLAN:   1. Chest pain - atypical, seems like acute pericarditis, the patient would really like to undergo another cardiac catheterization as she is symptomatic and worrying about another blockage, however I will try 2 weeks of Ibuprofen 400 mg po bid and Colchicine 0.6 mg po BID x 2 months. She has rub on physical exam.  I will follow in 2 weeks and if no improvement we will consider a cardiac cath given prior h/o CAD and stenting.   2.  CAD: s/p PCI to [facility] and mLAD 06/2015. She underwent nuclear stress test on 09/27/2016 was negative for ischemia and showed normal LVEF. Continue ASA, statin and BB.  Plan as above.   3. H/o Pericardial Effusion: Repeat echocardiogram shows only small amount effusion with no signs of tamponade.   4. New Onset Atrial Fibrillation: spontaneous conversion to NSR on IV cardizem in the ED on 09/09/16. No recurrent palpitations. Normal TSH.  CHADS-VASc 3, encouraged to take Eliquis for a/c. No bleeding. Also continue metoprolol and diltiazem. She underwent an echocardiogram in November 2017 that showed normal LVEF 58-52%, grade 1 diastolic dysfunction mild to moderate AI normal left atrial size and no pulmonary hypertension.  5. HTN: Controlled.  6. Acute on chronic diastolic CHF  - euvolemic on Lasix 20 mg po daily.   7. HLD: Continue Crestor.   8. H/o Lung Cancer: Rx Surgery/RT/chemo dx January 2001   PLAN  Follow up in 2 weeks.   Ena Dawley, MD 12/05/2016 9:32 AM

## 2016-12-05 NOTE — Patient Instructions (Addendum)
Medication Instructions:   HOLD YOUR MOBIC WHILE TAKING YOUR IBUPROFEN 400 MG TWICE DAILY   START TAKING IBUPROFEN 400 MG TWICE DAILY FOR 2 WEEKS ONLY--HOLD YOUR MOBIC WHILE TAKING THIS MED--IF THIS MEDICATION IRRITATES YOUR STOMACH, THEN TAKE ONLY COLCHICINE.   START TAKING COLCHICINE 0.6 MG TWICE DAILY--TAKE THIS WITH FOOD     Follow-Up:  2 WEEKS WITH DR [name] ---PLEASE ADD TO ONE OF HER QUARTER DAYS IN 2 WEEKS AT THE END SLOT---THIS IS FOR RE-EVALUATION OF ACUTE PERICARDITIS       If you need a refill on your cardiac medications before your next appointment, please call your pharmacy.

## 2016-12-16 ENCOUNTER — Encounter: Payer: Self-pay | Admitting: Physician Assistant

## 2016-12-16 ENCOUNTER — Ambulatory Visit (INDEPENDENT_AMBULATORY_CARE_PROVIDER_SITE_OTHER): Payer: Medicare Other | Admitting: Physician Assistant

## 2016-12-16 ENCOUNTER — Encounter: Payer: Self-pay | Admitting: *Deleted

## 2016-12-16 VITALS — BP 118/60 | HR 74 | Ht 67.5 in | Wt 250.8 lb

## 2016-12-16 DIAGNOSIS — R079 Chest pain, unspecified: Secondary | ICD-10-CM | POA: Diagnosis not present

## 2016-12-16 DIAGNOSIS — Z01812 Encounter for preprocedural laboratory examination: Secondary | ICD-10-CM

## 2016-12-16 DIAGNOSIS — I351 Nonrheumatic aortic (valve) insufficiency: Secondary | ICD-10-CM | POA: Diagnosis not present

## 2016-12-16 DIAGNOSIS — I2510 Atherosclerotic heart disease of native coronary artery without angina pectoris: Secondary | ICD-10-CM

## 2016-12-16 DIAGNOSIS — I10 Essential (primary) hypertension: Secondary | ICD-10-CM

## 2016-12-16 DIAGNOSIS — Z9861 Coronary angioplasty status: Secondary | ICD-10-CM

## 2016-12-16 DIAGNOSIS — I300 Acute nonspecific idiopathic pericarditis: Secondary | ICD-10-CM

## 2016-12-16 LAB — BASIC METABOLIC PANEL
BUN/Creatinine Ratio: 20 (ref 9–23)
BUN: 18 mg/dL (ref 6–24)
CO2: 25 mmol/L (ref 18–29)
Calcium: 8.7 mg/dL (ref 8.7–10.2)
Chloride: 105 mmol/L (ref 96–106)
Creatinine, Ser: 0.92 mg/dL (ref 0.57–1.00)
GFR calc Af Amer: 79 mL/min/{1.73_m2} (ref >59)
GFR calc non Af Amer: 69 mL/min/{1.73_m2} (ref >59)
Glucose: 170 mg/dL — ABNORMAL HIGH (ref 65–99)
Potassium: 4 mmol/L (ref 3.5–5.2)
Sodium: 145 mmol/L — ABNORMAL HIGH (ref 134–144)

## 2016-12-16 LAB — CBC WITH DIFFERENTIAL/PLATELET
Basophils Absolute: 0 10*3/uL (ref 0.0–0.2)
Basos: 0 %
EOS (ABSOLUTE): 0.5 10*3/uL — ABNORMAL HIGH (ref 0.0–0.4)
Eos: 8 %
Hematocrit: 34.9 % (ref 34.0–46.6)
Hemoglobin: 11.4 g/dL (ref 11.1–15.9)
Immature Grans (Abs): 0 10*3/uL (ref 0.0–0.1)
Immature Granulocytes: 1 %
Lymphocytes Absolute: 1.8 10*3/uL (ref 0.7–3.1)
Lymphs: 30 %
MCH: 29.5 pg (ref 26.6–33.0)
MCHC: 32.7 g/dL (ref 31.5–35.7)
MCV: 90 fL (ref 79–97)
Monocytes Absolute: 0.5 10*3/uL (ref 0.1–0.9)
Monocytes: 9 %
Neutrophils Absolute: 3.3 10*3/uL (ref 1.4–7.0)
Neutrophils: 52 %
Platelets: 337 10*3/uL (ref 150–379)
RBC: 3.86 x10E6/uL (ref 3.77–5.28)
RDW: 12.9 % (ref 12.3–15.4)
WBC: 6.1 10*3/uL (ref 3.4–10.8)

## 2016-12-16 LAB — PROTIME-INR
INR: 1 (ref 0.8–1.2)
Prothrombin Time: 10.8 s (ref 9.1–12.0)

## 2016-12-16 NOTE — Progress Notes (Signed)
Cardiology Office Note    Date:  12/16/2016   ID:  [name], DOB Mar 19, 1956, MRN [id number]  PCP:  [name] Reichmann, MD  Cardiologist: Dr. Meda [name]  Chief Complaint  Patient presents with  . Chest Pain    History of Present Illness:  [name] is a 61 y.o. female  with a history of CAD s/p DES to [facility] and mLAD 07/11/15, chronic pericardial effusion, DM, HTN, remote h/o treated non-small cell lung cancer, pulmonary fibrosis, aortic insufficiency, COPD, anemia and mod AI by echo 05/2015.   ED 08/2016 Afib converted to NSR sent home on cardizem(changed to metoprolol b/c of insurance) and eliquis. CHADSVASC=4.   Seen by Dr. Meda [name] 2/15/18And felt to have acute pericarditis. The patient wanted to have another cardiac catheterization by Dr. Meda [name] 1 to try 2 weeks of Ibuprofen and colchicine for 2 months. She had a rub on exam. She is here for follow-up today. If no improvement consider cardiac cath given history of CAD.  Patient comes in today not feeling any better. She stopped Ibuprofen after 1 week because of stomach upset. She has chest pain and shortness of breath with little activity. She really wants a cardiac cath because she is worried this is her coronary disease. She has chest pain with change of position.     Past Medical History:  Diagnosis Date  . Allergy   . Anemia   . Anemia   . Aortic insufficiency    a. Mod by echo 05/2015.  . Arthritis    "knees" (07/11/2015)  . Carcinoma of hilus of lung ([place]) 01/20/2012   IIIB NSCL  Right hilar mass compressing esophagus/presenting with dysphagia Rx Surgery/RT/chemo dx January 2001  . Complication of anesthesia    "I had a hard time waking me up w/one of my shoulder surgeries"  . Coronary artery disease    a. Abnormal stress test -> LHc 06/2015 s/p DES to mCX and mLAD, residual D2 disease treated medially.  . Elevated blood pressure   . Erythema nodosum   . Heart murmur   . Insomnia   . lung ca dx'd 10/1999   chemo/xrt comp  05/29/2000  . Myalgia   . Nephrolithiasis   . Obesity   . Pericardial effusion    a. 05/2015 Echo: EF 55-60%, no rwma, Gr 1 DD, no effusion - but an effusion was seen on CT which was felt to be increased in size compared to 12/2014 - pericardium also thickened.   . Pericarditis    a. Dx 05/2015.  [name] Pneumothorax, spontaneous, tension 01/20/2012   October, 2010  . Pulmonary fibrosis ([place]) 01/20/2012   Due to previous surgery and chest radiation for lung cancer  . Restrictive lung disease   . Type II diabetes mellitus ([place])     Past Surgical History:  Procedure Laterality Date  . ABDOMINAL HYSTERECTOMY  2008  . APPENDECTOMY  1969?  [name] CARDIAC CATHETERIZATION N/A 07/11/2015   Procedure: Left Heart Cath and Coronary Angiography;  Surgeon: [name] Booze, MD;  Location: [place] [facility];  Service: Cardiovascular;  Laterality: N/A;  . CARDIAC CATHETERIZATION  07/11/2015   Procedure: Coronary Stent Intervention;  Surgeon: [name] Booze, MD;  Location: [place] [facility];  Service: Cardiovascular;;  . CARPAL TUNNEL RELEASE Bilateral 1998-2005?   right-left  . CARPAL TUNNEL RELEASE Left 2009?   "for the 2nd time"  . COLONOSCOPY    . CORONARY ANGIOPLASTY    . CYSTOSCOPY W/ STONE MANIPULATION  2013  . ESOPHAGOGASTRODUODENOSCOPY (EGD) WITH ESOPHAGEAL DILATION  2012  . FOOT SURGERY Bilateral 1980's   Callous removed   . INNER EAR SURGERY Right ~ 2009  . KNEE ARTHROPLASTY Right 1991  . KNEE ARTHROSCOPY  ~ 2000   LATERAL RELEASE  . LUNG CANCER SURGERY Left 2001   "small cell"  . LUNG SURGERY Right 2007   "reinflated it"  . PULMONARY EMBOLISM SURGERY  2007   LUNG COLLAPSE  . SHOULDER ADHESION RELEASE Right ~ 2004  . SHOULDER ARTHROSCOPY W/ ROTATOR CUFF REPAIR Right ~ 2003    Current Medications: Outpatient Medications Prior to Visit  Medication Sig Dispense Refill  . apixaban (ELIQUIS) 5 MG TABS tablet Take 1 tablet (5 mg total) by mouth 2 (two) times daily. 180 tablet 1  .  aspirin EC 81 MG EC tablet Take 1 tablet (81 mg total) by mouth daily. 30 tablet 5  . B-D UF III MINI PEN NEEDLES 31G X 5 MM MISC 2 (two) times daily. as directed  11  . colchicine 0.6 MG tablet Take 1 tablet (0.6 mg total) by mouth 2 (two) times daily. 90 tablet 0  . diltiazem (CARDIZEM CD) 120 MG 24 hr capsule Take 1 capsule (120 mg total) by mouth daily. 90 capsule 3  . furosemide (LASIX) 20 MG tablet Take 1 tablet (20 mg total) by mouth daily. 90 tablet 3  . HUMALOG MIX 75/25 KWIKPEN (75-25) 100 UNIT/ML Kwikpen Inject 25-35 Units into the skin See admin instructions. Inject 35 units subque in Am and 25 units subque in PM  6  . losartan (COZAAR) 25 MG tablet Take 1 tablet (25 mg total) by mouth daily. 90 tablet 3  . metoprolol tartrate (LOPRESSOR) 25 MG tablet Take 1 tablet (25 mg total) by mouth 2 (two) times daily. 180 tablet 2  . ONE TOUCH ULTRA TEST test strip     . rosuvastatin (CRESTOR) 10 MG tablet Take 1 tablet (10 mg total) by mouth daily. 90 tablet 3  . hydrochlorothiazide (MICROZIDE) 12.5 MG capsule     . ibuprofen (ADVIL,MOTRIN) 400 MG tablet Take 1 tablet (400 mg total) by mouth 2 (two) times daily. TAKE FOR 2 WEEKS ONLY THEN DISCONTINUE (Patient not taking: Reported on 12/16/2016) 30 tablet 0  . meloxicam (MOBIC) 15 MG tablet      No facility-administered medications prior to visit.      Allergies:   Hydrocodone; Morphine and related; Lisinopril; and Nsaids   Social History   Social History  . Marital status: Single    Spouse name: N/A  . Number of children: 0  . Years of education: N/A   Occupational History  . RETIRED    Social History Main Topics  . Smoking status: Former Smoker    Packs/day: 0.25    Years: 5.00    Types: Cigarettes    Quit date: 07/22/1999  . Smokeless tobacco: Never Used  . Alcohol use No  . Drug use: No  . Sexual activity: Not Currently    Birth control/ protection: Other-see comments     Comment: hysterectomy   Other Topics Concern  .  None   Social History Narrative  . None     Family History:  The patient's family history includes Diabetes in her father, maternal grandmother, mother, paternal grandmother, and sister; Heart disease in her father; Hypertension in her sister; Pancreatic cancer in her mother.   ROS:   Please see the history of present illness.    Review  of Systems  Constitution: Negative.  HENT: Negative.   Eyes: Negative.   Cardiovascular: Positive for chest pain and leg swelling.  Respiratory: Negative.   Hematologic/Lymphatic: Negative.   Musculoskeletal: Negative.  Negative for joint pain.  Gastrointestinal: Negative.   Genitourinary: Negative.   Neurological: Negative.    All other systems reviewed and are negative.   PHYSICAL EXAM:   VS:  BP 118/60   Pulse 74   Ht 5' 7.5" (1.715 m)   Wt 250 lb 12.8 oz (113.8 kg)   BMI 38.70 kg/m   Physical Exam  GEN: Obese, in no acute distress  HEENT: normal  Neck: no JVD, carotid bruits, or masses Cardiac:RRR; loud rub on exam, 1 to 2/6 diastolic murmur at the left sternal border Respiratory:  Decreased breath sounds with some anterior wheezing  GI: soft, nontender, nondistended, + BS Ext: without cyanosis, clubbing, or edema, Good distal pulses bilaterally MS: no deformity or atrophy  Skin: warm and dry, no rash Neuro:  Alert and Oriented x 3, Strength and sensation are intact Psych: euthymic mood, full affect  Wt Readings from Last 3 Encounters:  12/16/16 250 lb 12.8 oz (113.8 kg)  12/05/16 257 lb (116.6 kg)  10/04/16 261 lb (118.4 kg)      Studies/Labs Reviewed:   EKG:  EKG is ordered today.  The ekg ordered today demonstrates Normal sinus rhythm with ST elevation consistent with pericarditis  Recent Labs: 09/09/2016: ALT 23 09/14/2016: BUN 27; Creatinine, Ser 1.04; Hemoglobin 12.0; Platelets 295; Potassium 3.7; Sodium 139 09/17/2016: TSH 3.99   Lipid Panel    Component Value Date/Time   CHOL 104 (L) 08/04/2015 1127   TRIG 74  08/04/2015 1127   HDL 47 08/04/2015 1127   CHOLHDL 2.2 08/04/2015 1127   VLDL 15 08/04/2015 1127   LDLCALC 42 08/04/2015 1127    Additional studies/ records that were reviewed today include:      Nuclear stress test: 11/29/2015  Nuclear stress EF: 62%.  There is a small defect of mild severity present in the mid anteroseptal location. The defect is non-reversible and consistent with breast attenuation artifact vs. prior infarct. No ischemia noted.  This is a low risk study.  The left ventricular ejection fraction is normal (55-65%).  Compared to study of 2016, there is now normal perfusion in the apical anterior and apical myocardium and the defect in the mid anteroseptum is fixed.   TTE: 09/18/2016   - Left ventricle: The cavity size was normal. Wall thickness was   normal. Systolic function was normal. The estimated ejection   fraction was in the range of 55% to 60%. Wall motion was normal;   there were no regional wall motion abnormalities. Doppler   parameters are consistent with abnormal left ventricular   relaxation (grade 1 diastolic dysfunction). - Aortic valve: There was no stenosis. There was mild to moderate   regurgitation. - Mitral valve: There was trivial regurgitation. - Right ventricle: The cavity size was normal. Systolic function   was normal. - Right atrium: The atrium was mildly dilated. - Pulmonic valve: Turbulence across the pulmonic valve but peak   gradient 15 mmHg does not suggest significant stenosis. There was   mild regurgitation. Left atrium:  The atrium was normal in size. - Pulmonary arteries: PA peak pressure: 28 mm Hg (S). - Inferior vena cava: The vessel was normal in size. The   respirophasic diameter changes were in the normal range (>= 50%),   consistent with normal central venous  pressure. - Pericardium, extracardiac: Small pericardial effusion, no   tamponade.   Impressions: - Normal LV size with EF 55-60%. Normal RV size and  systolic   function. Mild to moderate aortic insufficiency. Turbulence   across the pulmonic valve but no significant stenosis by doppler   measurement.     cardiac catheterization 07/11/15 Conclusion   Mid Cx lesion, 80% stenosed. There is a 0% residual stenosis post intervention with a 2.5 x 12 Synergy drug-eluting stent, postdilated to greater than 3 mm in diameter.  Mid LAD-1 lesion, 75% stenosed. There is a 0% residual stenosis post intervention with a 3.0 x 38 Synergy drug-eluting stent, postdilated to 3.6 mm in diameter.   Successful two-vessel intervention of the LAD and mid circumflex. Drug-eluting stents were placed in both vessels as noted above. The patient should continue with dual antiplatelet therapy for at least a year. She'll need aggressive secondary prevention including diabetes control.  She'll be watched overnight. Plan discharge home tomorrow.        ASSESSMENT:    1. Acute idiopathic pericarditis   2. Chest pain, unspecified type   3. CAD S/P percutaneous coronary angioplasty   4. Essential hypertension   5. Nonrheumatic aortic valve insufficiency   6. Pre-procedure lab exam      PLAN:  In order of problems listed above:  Acute idiopathic pericarditis patient has rub on exam and ST elevation on EKG. Dr. Meda [name] recommends steroids 40 mg once daily for 1 week in addition to the colchicine if her cardiac catheterization is normal.  Chest pain with history of CAD status post stents to the LAD and mid circumflex 06/2015 and low risk nuclear stress test 11/2015. Patient continues to have dyspnea on exertion chest pain and will like to proceed with cardiac catheterization to rule out progression. Dr. Meda [name] agrees.Hold Eliquis for 48 hrs prior to cath. I have reviewed the risks, indications, and alternatives to angioplasty and stenting with the patient. Risks include but are not limited to bleeding, infection, vascular injury, stroke, myocardial infection, arrhythmia,  kidney injury, radiation-related injury in the case of prolonged fluoroscopy use, emergency cardiac surgery, and death. The patient understands the risks of serious complication is low (<8%) and he agrees to proceed.     Essential hypertension controlled  Nonrheumatic aortic valve insufficiency   Medication Adjustments/Labs and Tests Ordered: Current medicines are reviewed at length with the patient today.  Concerns regarding medicines are outlined above.  Medication changes, Labs and Tests ordered today are listed in the Patient Instructions below. Patient Instructions  Your physician recommends that you continue on your current medications as directed. Please refer to the Current Medication list given to you today.   Your physician recommends that you return for lab work in:    [place]  INR   Your physician has requested that you have a cardiac catheterization. Cardiac catheterization is used to diagnose and/or treat various heart conditions. Doctors may recommend this procedure for a number of different reasons. The most common reason is to evaluate chest pain. Chest pain can be a symptom of coronary artery disease (CAD), and cardiac catheterization can show whether plaque is narrowing or blocking your heart's arteries. This procedure is also used to evaluate the valves, as well as measure the blood flow and oxygen levels in different parts of your heart. For further information please visit [facility]uge[facility]iest[employer][email or web address]. Please follow instruction sheet, as given.     Sumner Boast, PA-C  12/16/2016 11:46  AM    [facility] [place], [place], [place]  [address] Phone: [phone]; Fax: [phone]

## 2016-12-16 NOTE — Patient Instructions (Addendum)
Your physician recommends that you continue on your current medications as directed. Please refer to the Current Medication list given to you today.   Your physician recommends that you return for lab work in:    [place]  INR   Your physician has requested that you have a cardiac catheterization. Cardiac catheterization is used to diagnose and/or treat various heart conditions. Doctors may recommend this procedure for a number of different reasons. The most common reason is to evaluate chest pain. Chest pain can be a symptom of coronary artery disease (CAD), and cardiac catheterization can show whether plaque is narrowing or blocking your heart's arteries. This procedure is also used to evaluate the valves, as well as measure the blood flow and oxygen levels in different parts of your heart. For further information please visit [facility][email or web address]. Please follow instruction sheet, as given.

## 2016-12-18 ENCOUNTER — Ambulatory Visit (HOSPITAL_COMMUNITY)
Admission: RE | Admit: 2016-12-18 | Discharge: 2016-12-18 | Disposition: A | Discharge status: Home / Self Care | Payer: Medicare Other | Source: Ambulatory Visit | Attending: Cardiovascular Disease | Admitting: Cardiovascular Disease

## 2016-12-18 ENCOUNTER — Encounter (HOSPITAL_COMMUNITY)
Admission: RE | Disposition: A | Discharge status: Home / Self Care | Payer: Self-pay | Source: Ambulatory Visit | Attending: Cardiovascular Disease

## 2016-12-18 DIAGNOSIS — Z7901 Long term (current) use of anticoagulants: Secondary | ICD-10-CM | POA: Diagnosis not present

## 2016-12-18 DIAGNOSIS — E119 Type 2 diabetes mellitus without complications: Secondary | ICD-10-CM | POA: Insufficient documentation

## 2016-12-18 DIAGNOSIS — M170 Bilateral primary osteoarthritis of knee: Secondary | ICD-10-CM | POA: Insufficient documentation

## 2016-12-18 DIAGNOSIS — Z87891 Personal history of nicotine dependence: Secondary | ICD-10-CM | POA: Diagnosis not present

## 2016-12-18 DIAGNOSIS — J8410 Pulmonary fibrosis, unspecified: Secondary | ICD-10-CM | POA: Diagnosis not present

## 2016-12-18 DIAGNOSIS — R072 Precordial pain: Secondary | ICD-10-CM | POA: Insufficient documentation

## 2016-12-18 DIAGNOSIS — Z955 Presence of coronary angioplasty implant and graft: Secondary | ICD-10-CM | POA: Insufficient documentation

## 2016-12-18 DIAGNOSIS — Z9861 Coronary angioplasty status: Secondary | ICD-10-CM

## 2016-12-18 DIAGNOSIS — I351 Nonrheumatic aortic (valve) insufficiency: Secondary | ICD-10-CM | POA: Insufficient documentation

## 2016-12-18 DIAGNOSIS — D649 Anemia, unspecified: Secondary | ICD-10-CM | POA: Diagnosis not present

## 2016-12-18 DIAGNOSIS — G4700 Insomnia, unspecified: Secondary | ICD-10-CM | POA: Insufficient documentation

## 2016-12-18 DIAGNOSIS — I2510 Atherosclerotic heart disease of native coronary artery without angina pectoris: Secondary | ICD-10-CM | POA: Insufficient documentation

## 2016-12-18 DIAGNOSIS — R079 Chest pain, unspecified: Secondary | ICD-10-CM

## 2016-12-18 DIAGNOSIS — Z794 Long term (current) use of insulin: Secondary | ICD-10-CM | POA: Diagnosis not present

## 2016-12-18 DIAGNOSIS — Z6838 Body mass index (BMI) 38.0-38.9, adult: Secondary | ICD-10-CM | POA: Insufficient documentation

## 2016-12-18 DIAGNOSIS — Z833 Family history of diabetes mellitus: Secondary | ICD-10-CM | POA: Diagnosis not present

## 2016-12-18 DIAGNOSIS — J449 Chronic obstructive pulmonary disease, unspecified: Secondary | ICD-10-CM | POA: Insufficient documentation

## 2016-12-18 DIAGNOSIS — E669 Obesity, unspecified: Secondary | ICD-10-CM | POA: Diagnosis not present

## 2016-12-18 DIAGNOSIS — Z8249 Family history of ischemic heart disease and other diseases of the circulatory system: Secondary | ICD-10-CM | POA: Insufficient documentation

## 2016-12-18 DIAGNOSIS — Z7982 Long term (current) use of aspirin: Secondary | ICD-10-CM | POA: Insufficient documentation

## 2016-12-18 DIAGNOSIS — I10 Essential (primary) hypertension: Secondary | ICD-10-CM | POA: Insufficient documentation

## 2016-12-18 DIAGNOSIS — I300 Acute nonspecific idiopathic pericarditis: Secondary | ICD-10-CM | POA: Insufficient documentation

## 2016-12-18 HISTORY — PX: LEFT HEART CATH AND CORONARY ANGIOGRAPHY: CATH118249

## 2016-12-18 LAB — GLUCOSE, CAPILLARY
Glucose-Capillary: 149 mg/dL — ABNORMAL HIGH (ref 65–99)
Glucose-Capillary: 116 mg/dL — ABNORMAL HIGH (ref 65–99)

## 2016-12-18 SURGERY — LEFT HEART CATH AND CORONARY ANGIOGRAPHY
Anesthesia: LOCAL

## 2016-12-18 MED ORDER — LIDOCAINE HCL (PF) 1 % IJ SOLN
INTRAMUSCULAR | Status: AC
  Filled 2016-12-18: qty 30

## 2016-12-18 MED ORDER — IOPAMIDOL (ISOVUE-370) INJECTION 76%
INTRAVENOUS | Status: DC | PRN
  Administered 2016-12-18: 70 mL via INTRA_ARTERIAL

## 2016-12-18 MED ORDER — MIDAZOLAM HCL 2 MG/2ML IJ SOLN
INTRAMUSCULAR | Status: AC
  Filled 2016-12-18: qty 2

## 2016-12-18 MED ORDER — SODIUM CHLORIDE 0.9 % WEIGHT BASED INFUSION
3.0000 mL/kg/h | INTRAVENOUS | Status: DC
  Administered 2016-12-18: 3 mL/kg/h via INTRAVENOUS

## 2016-12-18 MED ORDER — LIDOCAINE HCL (PF) 1 % IJ SOLN
INTRAMUSCULAR | Status: DC | PRN
  Administered 2016-12-18: 3 mL via SUBCUTANEOUS

## 2016-12-18 MED ORDER — FENTANYL CITRATE (PF) 100 MCG/2ML IJ SOLN
INTRAMUSCULAR | Status: DC | PRN
  Administered 2016-12-18: 50 ug via INTRAVENOUS

## 2016-12-18 MED ORDER — SODIUM CHLORIDE 0.9% FLUSH: 3.0000 mL | INTRAVENOUS | Status: DC | PRN

## 2016-12-18 MED ORDER — HEPARIN (PORCINE) IN NACL 2-0.9 UNIT/ML-% IJ SOLN
INTRAMUSCULAR | Status: DC | PRN
Start: 2016-12-18 — End: 2016-12-18
  Administered 2016-12-18: 1000 mL

## 2016-12-18 MED ORDER — SODIUM CHLORIDE 0.9 % IV SOLN: INTRAVENOUS | Status: AC

## 2016-12-18 MED ORDER — VERAPAMIL HCL 2.5 MG/ML IV SOLN
INTRAVENOUS | Status: AC
  Filled 2016-12-18: qty 2

## 2016-12-18 MED ORDER — SODIUM CHLORIDE 0.9 % IV SOLN: INTRAVENOUS | Status: DC

## 2016-12-18 MED ORDER — SODIUM CHLORIDE 0.9 % WEIGHT BASED INFUSION: 1.0000 mL/kg/h | INTRAVENOUS | Status: DC

## 2016-12-18 MED ORDER — SODIUM CHLORIDE 0.9% FLUSH: 3.0000 mL | Freq: Two times a day (BID) | INTRAVENOUS | Status: DC

## 2016-12-18 MED ORDER — ASPIRIN 81 MG PO CHEW
81.0000 mg | CHEWABLE_TABLET | ORAL | Status: DC
Start: 2016-12-18 — End: 2016-12-18

## 2016-12-18 MED ORDER — SODIUM CHLORIDE 0.9 % IV SOLN
250.0000 mL | INTRAVENOUS | Status: DC | PRN
Start: 2016-12-18 — End: 2016-12-18

## 2016-12-18 MED ORDER — HEPARIN SODIUM (PORCINE) 1000 UNIT/ML IJ SOLN
INTRAMUSCULAR | Status: AC
  Filled 2016-12-18: qty 1

## 2016-12-18 MED ORDER — MIDAZOLAM HCL 2 MG/2ML IJ SOLN
INTRAMUSCULAR | Status: DC | PRN
  Administered 2016-12-18: 2 mg via INTRAVENOUS

## 2016-12-18 MED ORDER — FENTANYL CITRATE (PF) 100 MCG/2ML IJ SOLN
INTRAMUSCULAR | Status: AC
  Filled 2016-12-18: qty 2

## 2016-12-18 MED ORDER — SODIUM CHLORIDE 0.9 % IV SOLN: 250.0000 mL | INTRAVENOUS | Status: DC | PRN

## 2016-12-18 MED ORDER — HEPARIN (PORCINE) IN NACL 2-0.9 UNIT/ML-% IJ SOLN
INTRAMUSCULAR | Status: AC
  Filled 2016-12-18: qty 1000

## 2016-12-18 MED ORDER — NITROGLYCERIN 0.4 MG SL SUBL
0.4000 mg | SUBLINGUAL_TABLET | SUBLINGUAL | Status: DC | PRN
  Administered 2016-12-18 (×2): 0.4 mg via SUBLINGUAL

## 2016-12-18 MED ORDER — IOPAMIDOL (ISOVUE-370) INJECTION 76%
INTRAVENOUS | Status: AC
  Filled 2016-12-18: qty 100

## 2016-12-18 MED ORDER — VERAPAMIL HCL 2.5 MG/ML IV SOLN
INTRAVENOUS | Status: DC | PRN
  Administered 2016-12-18: 10 mL via INTRA_ARTERIAL

## 2016-12-18 MED ORDER — NITROGLYCERIN 0.4 MG SL SUBL
SUBLINGUAL_TABLET | SUBLINGUAL | Status: AC
  Filled 2016-12-18: qty 1

## 2016-12-18 SURGICAL SUPPLY — 11 items
CATH 5FR JL3.5 JR4 ANG PIG MP (CATHETERS) ×1 IMPLANT
CATH EXPO 5FR FR4 (CATHETERS) ×1 IMPLANT
DEVICE RAD COMP TR BAND LRG (VASCULAR PRODUCTS) ×1 IMPLANT
GLIDESHEATH SLEND SS 6F .021 (SHEATH) ×1 IMPLANT
GUIDEWIRE INQWIRE 1.5J.035X260 (WIRE) IMPLANT
INQWIRE 1.5J .035X260CM (WIRE) ×2
KIT HEART LEFT (KITS) ×2 IMPLANT
PACK CARDIAC CATHETERIZATION (CUSTOM PROCEDURE TRAY) ×2 IMPLANT
SYR MEDRAD MARK V 150ML (SYRINGE) ×2 IMPLANT
TRANSDUCER W/STOPCOCK (MISCELLANEOUS) ×2 IMPLANT
TUBING CIL FLEX 10 FLL-RA (TUBING) ×2 IMPLANT

## 2016-12-18 NOTE — Discharge Instructions (Signed)
Radial Site Care Refer to this sheet in the next few weeks. These instructions provide you with information about caring for yourself after your procedure. Your health care provider may also give you more specific instructions. Your treatment has been planned according to current medical practices, but problems sometimes occur. Call your health care provider if you have any problems or questions after your procedure. What can I expect after the procedure? After your procedure, it is typical to have the following:  Bruising at the radial site that usually fades within 1-2 weeks.  Blood collecting in the tissue (hematoma) that may be painful to the touch. It should usually decrease in size and tenderness within 1-2 weeks. Follow these instructions at home:  Take medicines only as directed by your health care provider.  You may shower 24-48 hours after the procedure or as directed by your health care provider. Remove the bandage (dressing) and gently wash the site with plain soap and water. Pat the area dry with a clean towel. Do not rub the site, because this may cause bleeding.  Do not take baths, swim, or use a hot tub until your health care provider approves.  Check your insertion site every day for redness, swelling, or drainage.  Do not apply powder or lotion to the site.  Do not flex or bend the affected arm for 24 hours or as directed by your health care provider.  Do not push or pull heavy objects with the affected arm for 24 hours or as directed by your health care provider.  Do not lift over 10 lb (4.5 kg) for 5 days after your procedure or as directed by your health care provider.  Ask your health care provider when it is okay to:  Return to work or school.  Resume usual physical activities or sports.  Resume sexual activity.  Do not drive home if you are discharged the same day as the procedure. Have someone else drive you.  You may drive 24 hours after the procedure  unless otherwise instructed by your health care provider.  Do not operate machinery or power tools for 24 hours after the procedure.  If your procedure was done as an outpatient procedure, which means that you went home the same day as your procedure, a responsible adult should be with you for the first 24 hours after you arrive home.  Keep all follow-up visits as directed by your health care provider. This is important. Contact a health care provider if:  You have a fever.  You have chills.  You have increased bleeding from the radial site. Hold pressure on the site. Get help right away if:  You have unusual pain at the radial site.  You have redness, warmth, or swelling at the radial site.  You have drainage (other than a small amount of blood on the dressing) from the radial site.  The radial site is bleeding, and the bleeding does not stop after 30 minutes of holding steady pressure on the site.  Your arm or hand becomes pale, cool, tingly, or numb. This information is not intended to replace advice given to you by your health care provider. Make sure you discuss any questions you have with your health care provider. Document Released: 11/09/2010 Document Revised: 03/14/2016 Document Reviewed: 04/25/2014 Elsevier Interactive Patient Education  2017 [place] tomorrow if no bleeding from cath site.

## 2016-12-18 NOTE — H&P (View-Only) (Signed)
Cardiology Office Note    Date:  12/16/2016   ID:  [name], DOB 1956-01-31, MRN [id number]  PCP:  Jenny Reichmann, MD  Cardiologist: Dr. [name]  Chief Complaint  Patient presents with  . Chest Pain    History of Present Illness:  [name] is a 61 y.o. female  with a history of CAD s/p DES to [facility] and mLAD 07/11/15, chronic pericardial effusion, DM, HTN, remote h/o treated non-small cell lung cancer, pulmonary fibrosis, aortic insufficiency, COPD, anemia and mod AI by echo 05/2015.   ED 08/2016 Afib converted to NSR sent home on cardizem(changed to metoprolol b/c of insurance) and eliquis. CHADSVASC=4.   Seen by Dr. [name] 2/15/18And felt to have acute pericarditis. The patient wanted to have another cardiac catheterization by Dr. [name] 1 to try 2 weeks of Ibuprofen and colchicine for 2 months. She had a rub on exam. She is here for follow-up today. If no improvement consider cardiac cath given history of CAD.  Patient comes in today not feeling any better. She stopped Ibuprofen after 1 week because of stomach upset. She has chest pain and shortness of breath with little activity. She really wants a cardiac cath because she is worried this is her coronary disease. She has chest pain with change of position.     Past Medical History:  Diagnosis Date  . Allergy   . Anemia   . Anemia   . Aortic insufficiency    a. Mod by echo 05/2015.  . Arthritis    "knees" (07/11/2015)  . Carcinoma of hilus of lung ([place]) 01/20/2012   IIIB NSCL  Right hilar mass compressing esophagus/presenting with dysphagia Rx Surgery/RT/chemo dx January 2001  . Complication of anesthesia    "I had a hard time waking me up w/one of my shoulder surgeries"  . Coronary artery disease    a. Abnormal stress test -> LHc 06/2015 s/p DES to mCX and mLAD, residual D2 disease treated medially.  . Elevated blood pressure   . Erythema nodosum   . Heart murmur   . Insomnia   . lung ca dx'd 10/1999   chemo/xrt comp  05/29/2000  . Myalgia   . Nephrolithiasis   . Obesity   . Pericardial effusion    a. 05/2015 Echo: EF 55-60%, no rwma, Gr 1 DD, no effusion - but an effusion was seen on CT which was felt to be increased in size compared to 12/2014 - pericardium also thickened.   . Pericarditis    a. Dx 05/2015.  [name] Pneumothorax, spontaneous, tension 01/20/2012   October, 2010  . Pulmonary fibrosis ([place]) 01/20/2012   Due to previous surgery and chest radiation for lung cancer  . Restrictive lung disease   . Type II diabetes mellitus ([place])     Past Surgical History:  Procedure Laterality Date  . ABDOMINAL HYSTERECTOMY  2008  . APPENDECTOMY  1969?  [name] CARDIAC CATHETERIZATION N/A 07/11/2015   Procedure: Left Heart Cath and Coronary Angiography;  Surgeon: Jettie Booze, MD;  Location: [facility];  Service: Cardiovascular;  Laterality: N/A;  . CARDIAC CATHETERIZATION  07/11/2015   Procedure: Coronary Stent Intervention;  Surgeon: Jettie Booze, MD;  Location: [facility];  Service: Cardiovascular;;  . CARPAL TUNNEL RELEASE Bilateral 1998-2005?   right-left  . CARPAL TUNNEL RELEASE Left 2009?   "for the 2nd time"  . COLONOSCOPY    . CORONARY ANGIOPLASTY    . CYSTOSCOPY W/ STONE MANIPULATION  2013  . ESOPHAGOGASTRODUODENOSCOPY (EGD) WITH ESOPHAGEAL DILATION  2012  . FOOT SURGERY Bilateral 1980's   Callous removed   . INNER EAR SURGERY Right ~ 2009  . KNEE ARTHROPLASTY Right 1991  . KNEE ARTHROSCOPY  ~ 2000   LATERAL RELEASE  . LUNG CANCER SURGERY Left 2001   "small cell"  . LUNG SURGERY Right 2007   "reinflated it"  . PULMONARY EMBOLISM SURGERY  2007   LUNG COLLAPSE  . SHOULDER ADHESION RELEASE Right ~ 2004  . SHOULDER ARTHROSCOPY W/ ROTATOR CUFF REPAIR Right ~ 2003    Current Medications: Outpatient Medications Prior to Visit  Medication Sig Dispense Refill  . apixaban (ELIQUIS) 5 MG TABS tablet Take 1 tablet (5 mg total) by mouth 2 (two) times daily. 180 tablet 1  .  aspirin EC 81 MG EC tablet Take 1 tablet (81 mg total) by mouth daily. 30 tablet 5  . B-D UF III MINI PEN NEEDLES 31G X 5 MM MISC 2 (two) times daily. as directed  11  . colchicine 0.6 MG tablet Take 1 tablet (0.6 mg total) by mouth 2 (two) times daily. 90 tablet 0  . diltiazem (CARDIZEM CD) 120 MG 24 hr capsule Take 1 capsule (120 mg total) by mouth daily. 90 capsule 3  . furosemide (LASIX) 20 MG tablet Take 1 tablet (20 mg total) by mouth daily. 90 tablet 3  . HUMALOG MIX 75/25 KWIKPEN (75-25) 100 UNIT/ML Kwikpen Inject 25-35 Units into the skin See admin instructions. Inject 35 units subque in Am and 25 units subque in PM  6  . losartan (COZAAR) 25 MG tablet Take 1 tablet (25 mg total) by mouth daily. 90 tablet 3  . metoprolol tartrate (LOPRESSOR) 25 MG tablet Take 1 tablet (25 mg total) by mouth 2 (two) times daily. 180 tablet 2  . ONE TOUCH ULTRA TEST test strip     . rosuvastatin (CRESTOR) 10 MG tablet Take 1 tablet (10 mg total) by mouth daily. 90 tablet 3  . hydrochlorothiazide (MICROZIDE) 12.5 MG capsule     . ibuprofen (ADVIL,MOTRIN) 400 MG tablet Take 1 tablet (400 mg total) by mouth 2 (two) times daily. TAKE FOR 2 WEEKS ONLY THEN DISCONTINUE (Patient not taking: Reported on 12/16/2016) 30 tablet 0  . meloxicam (MOBIC) 15 MG tablet      No facility-administered medications prior to visit.      Allergies:   Hydrocodone; Morphine and related; Lisinopril; and Nsaids   Social History   Social History  . Marital status: Single    Spouse name: N/A  . Number of children: 0  . Years of education: N/A   Occupational History  . RETIRED    Social History Main Topics  . Smoking status: Former Smoker    Packs/day: 0.25    Years: 5.00    Types: Cigarettes    Quit date: 07/22/1999  . Smokeless tobacco: Never Used  . Alcohol use No  . Drug use: No  . Sexual activity: Not Currently    Birth control/ protection: Other-see comments     Comment: hysterectomy   Other Topics Concern  .  None   Social History Narrative  . None     Family History:  The patient's family history includes Diabetes in her father, maternal grandmother, mother, paternal grandmother, and sister; Heart disease in her father; Hypertension in her sister; Pancreatic cancer in her mother.   ROS:   Please see the history of present illness.    Review  of Systems  Constitution: Negative.  HENT: Negative.   Eyes: Negative.   Cardiovascular: Positive for chest pain and leg swelling.  Respiratory: Negative.   Hematologic/Lymphatic: Negative.   Musculoskeletal: Negative.  Negative for joint pain.  Gastrointestinal: Negative.   Genitourinary: Negative.   Neurological: Negative.    All other systems reviewed and are negative.   PHYSICAL EXAM:   VS:  BP 118/60   Pulse 74   Ht 5' 7.5" (1.715 m)   Wt 250 lb 12.8 oz (113.8 kg)   BMI 38.70 kg/m   Physical Exam  GEN: Obese, in no acute distress  HEENT: normal  Neck: no JVD, carotid bruits, or masses Cardiac:RRR; loud rub on exam, 1 to 2/6 diastolic murmur at the left sternal border Respiratory:  Decreased breath sounds with some anterior wheezing  GI: soft, nontender, nondistended, + BS Ext: without cyanosis, clubbing, or edema, Good distal pulses bilaterally MS: no deformity or atrophy  Skin: warm and dry, no rash Neuro:  Alert and Oriented x 3, Strength and sensation are intact Psych: euthymic mood, full affect  Wt Readings from Last 3 Encounters:  12/16/16 250 lb 12.8 oz (113.8 kg)  12/05/16 257 lb (116.6 kg)  10/04/16 261 lb (118.4 kg)      Studies/Labs Reviewed:   EKG:  EKG is ordered today.  The ekg ordered today demonstrates Normal sinus rhythm with ST elevation consistent with pericarditis  Recent Labs: 09/09/2016: ALT 23 09/14/2016: BUN 27; Creatinine, Ser 1.04; Hemoglobin 12.0; Platelets 295; Potassium 3.7; Sodium 139 09/17/2016: TSH 3.99   Lipid Panel    Component Value Date/Time   CHOL 104 (L) 08/04/2015 1127   TRIG 74  08/04/2015 1127   HDL 47 08/04/2015 1127   CHOLHDL 2.2 08/04/2015 1127   VLDL 15 08/04/2015 1127   LDLCALC 42 08/04/2015 1127    Additional studies/ records that were reviewed today include:      Nuclear stress test: 11/29/2015  Nuclear stress EF: 62%.  There is a small defect of mild severity present in the mid anteroseptal location. The defect is non-reversible and consistent with breast attenuation artifact vs. prior infarct. No ischemia noted.  This is a low risk study.  The left ventricular ejection fraction is normal (55-65%).  Compared to study of 2016, there is now normal perfusion in the apical anterior and apical myocardium and the defect in the mid anteroseptum is fixed.   TTE: 09/18/2016   - Left ventricle: The cavity size was normal. Wall thickness was   normal. Systolic function was normal. The estimated ejection   fraction was in the range of 55% to 60%. Wall motion was normal;   there were no regional wall motion abnormalities. Doppler   parameters are consistent with abnormal left ventricular   relaxation (grade 1 diastolic dysfunction). - Aortic valve: There was no stenosis. There was mild to moderate   regurgitation. - Mitral valve: There was trivial regurgitation. - Right ventricle: The cavity size was normal. Systolic function   was normal. - Right atrium: The atrium was mildly dilated. - Pulmonic valve: Turbulence across the pulmonic valve but peak   gradient 15 mmHg does not suggest significant stenosis. There was   mild regurgitation. Left atrium:  The atrium was normal in size. - Pulmonary arteries: PA peak pressure: 28 mm Hg (S). - Inferior vena cava: The vessel was normal in size. The   respirophasic diameter changes were in the normal range (>= 50%),   consistent with normal central venous  pressure. - Pericardium, extracardiac: Small pericardial effusion, no   tamponade.   Impressions: - Normal LV size with EF 55-60%. Normal RV size and  systolic   function. Mild to moderate aortic insufficiency. Turbulence   across the pulmonic valve but no significant stenosis by doppler   measurement.     cardiac catheterization 07/11/15 Conclusion   Mid Cx lesion, 80% stenosed. There is a 0% residual stenosis post intervention with a 2.5 x 12 Synergy drug-eluting stent, postdilated to greater than 3 mm in diameter.  Mid LAD-1 lesion, 75% stenosed. There is a 0% residual stenosis post intervention with a 3.0 x 38 Synergy drug-eluting stent, postdilated to 3.6 mm in diameter.   Successful two-vessel intervention of the LAD and mid circumflex. Drug-eluting stents were placed in both vessels as noted above. The patient should continue with dual antiplatelet therapy for at least a year. She'll need aggressive secondary prevention including diabetes control.  She'll be watched overnight. Plan discharge home tomorrow.        ASSESSMENT:    1. Acute idiopathic pericarditis   2. Chest pain, unspecified type   3. CAD S/P percutaneous coronary angioplasty   4. Essential hypertension   5. Nonrheumatic aortic valve insufficiency   6. Pre-procedure lab exam      PLAN:  In order of problems listed above:  Acute idiopathic pericarditis patient has rub on exam and ST elevation on EKG. Dr. [name] recommends steroids 40 mg once daily for 1 week in addition to the colchicine if her cardiac catheterization is normal.  Chest pain with history of CAD status post stents to the LAD and mid circumflex 06/2015 and low risk nuclear stress test 11/2015. Patient continues to have dyspnea on exertion chest pain and will like to proceed with cardiac catheterization to rule out progression. Dr. [name] agrees.Hold Eliquis for 48 hrs prior to cath. I have reviewed the risks, indications, and alternatives to angioplasty and stenting with the patient. Risks include but are not limited to bleeding, infection, vascular injury, stroke, myocardial infection, arrhythmia,  kidney injury, radiation-related injury in the case of prolonged fluoroscopy use, emergency cardiac surgery, and death. The patient understands the risks of serious complication is low (<4%) and he agrees to proceed.     Essential hypertension controlled  Nonrheumatic aortic valve insufficiency   Medication Adjustments/Labs and Tests Ordered: Current medicines are reviewed at length with the patient today.  Concerns regarding medicines are outlined above.  Medication changes, Labs and Tests ordered today are listed in the Patient Instructions below. Patient Instructions  Your physician recommends that you continue on your current medications as directed. Please refer to the Current Medication list given to you today.   Your physician recommends that you return for lab work in:    [place]  INR   Your physician has requested that you have a cardiac catheterization. Cardiac catheterization is used to diagnose and/or treat various heart conditions. Doctors may recommend this procedure for a number of different reasons. The most common reason is to evaluate chest pain. Chest pain can be a symptom of coronary artery disease (CAD), and cardiac catheterization can show whether plaque is narrowing or blocking your heart's arteries. This procedure is also used to evaluate the valves, as well as measure the blood flow and oxygen levels in different parts of your heart. For further information please visit [email or web address]. Please follow instruction sheet, as given.     Sumner Boast, PA-C  12/16/2016 11:46  AM    [facility] [place], [place], [place]  [address] Phone: [phone]; Fax: [phone]

## 2016-12-18 NOTE — Interval H&P Note (Signed)
History and Physical Interval Note:  12/18/2016 2:10 PM  [name]  has presented today for cardiac cath with the diagnosis of chest pain, known CAD.   The various methods of treatment have been discussed with the patient and family. After consideration of risks, benefits and other options for treatment, the patient has consented to  Procedure(s): Left Heart Cath and Coronary Angiography (N/A) as a surgical intervention .  The patient's history has been reviewed, patient examined, no change in status, stable for surgery.  I have reviewed the patient's chart and labs.  Questions were answered to the patient's satisfaction.    Cath Lab Visit (complete for each Cath Lab visit)  Clinical Evaluation Leading to the Procedure:   ACS: No.  Non-ACS:    Anginal Classification: CCS II  Anti-ischemic medical therapy: Maximal Therapy (2 or more classes of medications)  Non-Invasive Test Results: No non-invasive testing performed  Prior CABG: No previous CABG         Lauree Chandler

## 2016-12-18 NOTE — Progress Notes (Addendum)
During routine pain assessment pt states she is having chest pain/pressure at a level 6. Emergency protocol started.  Nitro given and 5 min later states that it is at a 6/10.  Second nitro given.  Pt obtained a level 4/10 after second nitro.  States it is OK and no different than she has at home. Resting comfortably. VSS. Rennis Harding from cath lab aware.

## 2016-12-19 ENCOUNTER — Encounter (HOSPITAL_COMMUNITY): Payer: Self-pay | Admitting: Cardiovascular Disease

## 2016-12-19 MED FILL — Heparin Sodium (Porcine) Inj 1000 Unit/ML: INTRAMUSCULAR | Qty: 10 | Status: AC

## 2016-12-23 ENCOUNTER — Telehealth: Payer: Self-pay | Admitting: Cardiology

## 2016-12-23 NOTE — Telephone Encounter (Signed)
Dr [name], can you take a look at her cath report from 2/28 and advise if follow-up with our office is needed.  I see follow-up on her discharge instructions recommend that she get in to see her Pulmonologist.  She will see Pulmonary on 3/19. Pt is calling asking if she needs to follow with [place] as well, post-cath.

## 2016-12-23 NOTE — Telephone Encounter (Signed)
Follow up with me in 3 months

## 2016-12-23 NOTE — Telephone Encounter (Signed)
New Message     Pt had heart cath wed 12/18/16  how soon does she need a follow up appt?

## 2016-12-23 NOTE — Telephone Encounter (Signed)
Informed the pt that Dr [name] reviewed her cath report and it looked normal, so she doesn't have to follow up with her for another 3 months.  Advised the pt to keep her follow-up with her Pulmonologist on 3/19, and continue her current med regimen.  Scheduled the pt a follow-up appt for 03/26/17 at 1140 with Dr [name].  Pt verbalized understanding and agrees with this plan.

## 2017-01-06 ENCOUNTER — Ambulatory Visit (INDEPENDENT_AMBULATORY_CARE_PROVIDER_SITE_OTHER): Payer: Medicare Other | Admitting: Pulmonary Disease

## 2017-01-06 ENCOUNTER — Encounter: Payer: Self-pay | Admitting: Pulmonary Disease

## 2017-01-06 DIAGNOSIS — J984 Other disorders of lung: Secondary | ICD-10-CM

## 2017-01-06 DIAGNOSIS — C3400 Malignant neoplasm of unspecified main bronchus: Secondary | ICD-10-CM | POA: Diagnosis not present

## 2017-01-06 NOTE — Assessment & Plan Note (Signed)
Surveillance with annual chest x-ray

## 2017-01-06 NOTE — Progress Notes (Signed)
   Subjective:    Patient ID: MINAL STULLER, female    DOB: 1956-05-15, 61 y.o.   MRN: [id number]  HPI  60/ F, with dyspnea is attributed to restrictive lung disease s/p RUlobectomy for spont pneumothx in 07/2006, s/p chemo/ XRT for stg III B lung cancer in 2001 - in remission since!    She was admitted 05/2015 for a small pericardial effusion and acute pericarditis. 2-D echo showed grade 1 diastolic dysfunction with an ejection fraction of 55% with no significant pulmonary artery hypertension.she  was treated with colchicine and Motrin. She underwent a cardiac cath 06/2015 due to persistent chest pain and required 2 stents  She was diagnosed with atrial fibrillation 08/2016. Underwent repeat cardiac 11/2016 which showed patent stents. Echo showed small pericardial effusion and stable Moderate aortic regurgitation . She is now on metoprolol and Cardizem for rate control and anticoagulation  She continues to complain of shortness of breath on exertion this is been a long-standing problem. Occasional wheezing after exercise-no pedal edema or orthopnea or paroxysmal nocturnal dyspnea. Weight has been stable at 252 pounds with clothes on  I reviewed all her cardiac testing and cardiology notes     Significant tests/ events   PFTs in 11/2006 and spirometry in 06/2008 consistent with moderate restrictive lung disease. Lung volumes were decreased as was diffusion capacity, but this corrected for alveolar volume.  Spirometry 09/2009 showed decline in FEV1 from 57 to 40%. PFT 10/2009 showed FEV1 51% & FVC 47%, DLCO was 47% but corrected for alveolar volume   01/2014 CPET >> decreased exercise tolerance, peak VO2 56% of predicted, no ventilatory limitation, no cardiac limitation-O2 pulse 73% predicted, preexercise PFTs-FVC 51%, FEV1 54%, ratio 84, MVV 65%  CT chest 05/2015 >> postop changes in both lungs, no evidence of tumor recurrence.     Review of Systems neg for any significant  sore throat, dysphagia, itching, sneezing, nasal congestion or excess/ purulent secretions, fever, chills, sweats, unintended wt loss, pleuritic or exertional cp, hempoptysis, orthopnea pnd or change in chronic leg swelling. Also denies presyncope, palpitations, heartburn, abdominal pain, nausea, vomiting, diarrhea or change in bowel or urinary habits, dysuria,hematuria, rash, arthralgias, visual complaints, headache, numbness weakness or ataxia.     Objective:   Physical Exam  Gen. Pleasant, obese, in no distress ENT - no lesions, no post nasal drip Neck: No JVD, no thyromegaly, no carotid bruits Lungs: no use of accessory muscles, no dullness to percussion, decreased without rales or rhonchi  Cardiovascular: Rhythm regular, heart sounds  normal, no murmurs or gallops, no peripheral edema Musculoskeletal: No deformities, no cyanosis or clubbing , no tremors       Assessment & Plan:

## 2017-01-06 NOTE — Patient Instructions (Signed)
Keep up with physical activity

## 2017-01-06 NOTE — Assessment & Plan Note (Signed)
Appears stable She will continue physical activity and keep up with conditioning. No need for bronchodilators, this would only worsen atrial fibrillation

## 2017-01-08 ENCOUNTER — Telehealth: Payer: Self-pay | Admitting: Cardiology

## 2017-01-08 MED ORDER — ROSUVASTATIN CALCIUM 5 MG PO TABS: 5.0000 mg | ORAL_TABLET | Freq: Every day | ORAL | 1 refills | Status: DC

## 2017-01-08 NOTE — Telephone Encounter (Signed)
Pt calling in to inform Dr [name] that she has been experiencing leg cramps, and her [facility] RN informed her that crestor and colchicine are causing this.  Informed the pt that most likely this could be related to taking crestor, vs colchicine.  Informed the pt that I will route these concerns to Dr [name] to review and advise on, and follow-up with her shortly thereafter once recommendations are provided.  Pt agreed to plan.

## 2017-01-08 NOTE — Telephone Encounter (Signed)
Colchicine can inhibit the elimination of Crestor and increase Crestor concentrations in the body. Pt is only taking '10mg'$  of Crestor daily so the interaction is not causing her to receive a toxic amount of the Crestor, but she may be sensitive to the higher exposure of Crestor. Myalgias are recent but pt has been on both Crestor and colchicine since 2016 per Epic reports. She could try decreasing Crestor to '5mg'$  daily and see if she tolerates lower dose better. She could also try Coenzyme Q 10 OTC 200-'400mg'$  daily to see if this helps with cramping.

## 2017-01-08 NOTE — Telephone Encounter (Signed)
Left a message for the pt to call back to endorse recommendations per Dr [name], about the pts crestor and colchicine concerns.

## 2017-01-08 NOTE — Telephone Encounter (Signed)
Follow up     Pt not sure if its the crestor or the colchicine that is causing her to have cramping in her legs and thighs, and ankles

## 2017-01-08 NOTE — Telephone Encounter (Signed)
-----   Message from Dorothy Spark, MD sent at 01/08/2017  1:44 PM EDT ----- Please advise her to decrease crestor to 5 mg po daily while she is taking colchicine.

## 2017-01-08 NOTE — Telephone Encounter (Signed)
Thank you :)

## 2017-01-08 NOTE — Telephone Encounter (Signed)
New message    Pt is returning call to [facility].

## 2017-01-08 NOTE — Telephone Encounter (Signed)
Patient calling states that she was going over her medications with a nurse from [facility] and the nurse stated that she believed her colchicine and/ or crestor medications were causing the leg cramps. Please call to discuss,thanks.

## 2017-01-08 NOTE — Telephone Encounter (Signed)
Notified the pt of Dr Francesca Oman recommendations for her to decrease her Crestor to 5 mg po daily, and see if that helps with cramping in her lower extremities.  Informed the pt that I will send just a month supply to her confirmed pharmacy of choice, and if she tolerates this appropriately, then she can call us back, to request further refills.  Advised the pt to continue taking her colchicine.  Pt verbalized understanding and agrees with this plan.

## 2017-01-08 NOTE — Telephone Encounter (Signed)
Jinny Blossom, is there any interaction between crestor and colchicine?

## 2017-02-03 ENCOUNTER — Other Ambulatory Visit: Payer: Self-pay | Admitting: Cardiology

## 2017-02-03 MED ORDER — ROSUVASTATIN CALCIUM 5 MG PO TABS: 5.0000 mg | ORAL_TABLET | Freq: Every day | ORAL | 3 refills | Status: DC

## 2017-03-12 ENCOUNTER — Telehealth: Payer: Self-pay | Admitting: Cardiology

## 2017-03-12 NOTE — Telephone Encounter (Signed)
New Message   *STAT* If patient is at the pharmacy, call can be transferred to refill team.   1. Which medications need to be refilled? (please list name of each medication and dose if known) rosuvastatin (crestor) 5 mg tablet once daily  2. Which pharmacy/location (including street and city if local pharmacy) is medication to be sent to? [facility] [place], [place]  3. Do they need a 30 day or 90 day supply? 30 day supply

## 2017-03-12 NOTE — Telephone Encounter (Signed)
Called pt to inform her that she has refills left at her pharmacy. [facility] and the pharmacist stated that they do have Crestor 5 mg tablet, dispensing 90 tablets with 3 refills.. I advised the pt that if she has any other problems, questions or concerns to call the office. Pt verbalized understanding.

## 2017-03-18 ENCOUNTER — Ambulatory Visit (INDEPENDENT_AMBULATORY_CARE_PROVIDER_SITE_OTHER): Payer: Medicare Other | Admitting: Gastroenterology

## 2017-03-18 ENCOUNTER — Encounter: Payer: Self-pay | Admitting: Gastroenterology

## 2017-03-18 ENCOUNTER — Other Ambulatory Visit (INDEPENDENT_AMBULATORY_CARE_PROVIDER_SITE_OTHER): Payer: Medicare Other

## 2017-03-18 VITALS — BP 148/64 | HR 72 | Ht 67.5 in | Wt 254.4 lb

## 2017-03-18 DIAGNOSIS — R490 Dysphonia: Secondary | ICD-10-CM

## 2017-03-18 DIAGNOSIS — Z85118 Personal history of other malignant neoplasm of bronchus and lung: Secondary | ICD-10-CM

## 2017-03-18 LAB — CREATININE, SERUM: Creatinine, Ser: 0.82 mg/dL (ref 0.40–1.20)

## 2017-03-18 LAB — BUN: BUN: 25 mg/dL — ABNORMAL HIGH (ref 6–23)

## 2017-03-18 NOTE — Patient Instructions (Addendum)
Chest CT with contrast for hoarseness, h/o lung cancer 2001.   You have been scheduled for a CT scan of the abdomen and pelvis at [facility] ([address]---this is in the same building as Press [facility]).   You are scheduled on 03/20/17 at 3:30 pm . You should arrive 15 minutes prior to your appointment time for registration. Please follow the written instructions below on the day of your exam:  This test typically takes 30-45 minutes to complete.  If you have any questions regarding your exam or if you need to reschedule, you may call the CT department at [phone] between the hours of 8:00 am and 5:00 pm, Monday-Friday.  ________________________________________________________  Cc: Dr. Julien Nordmann.

## 2017-03-18 NOTE — Progress Notes (Signed)
Review of gastrointestinal problems:  1. Routine risk for colon cancer; Feb 2009 colonoscopy with dr. Lajoyce Corners, no polyps. Next colonoscopy should be 11/2017;  09/2011 repeat colonoscopy Dr. Ardis Hughs for change in bowels (looser) found L sided diverticulosis, mucosa normal (no polyps or cancer), random biospies no microscopic colitis. 2. submucosal gastric lesion, first noted early 2009. Endoscopic ultrasound procedures have been performed, none with firm tissue diagnosis, next Examination at 3 year interval given stability, small size of lesion. Repeat endoscopic ultrasound 2010, discrete submucosal lesion, 1.8 cm. Repeat evaluation was set for 3 year interval. EGD 07/2012 Dr. Ardis Hughs showed nodule has not changed in size, recommended recall again at 3 years. EGD 12/2015 Dr. Ardis Hughs no change in size; 1.8cm "the mass in proximal stomach has not changed in 8 years of surveillance." 3. intermittent dysphagia since radiation, chemotherapy for lung cancer. Barium esophagram 2009 showed decreased peristalsis but no discrete strictures. Dr. Beryle Beams, Dr. Arlyce Dice, originally diagnosed in 2001.   HPI: This is a very pleasant 61 year old woman whom I last saw about a year ago.  Chief complaint is hoarseness, mild intermittent dysphagia  Since march she's had hoarseness. This is intermittent, has been going on for about a year. His intermittent but seems to be getting worse. She actually thinks this goes back maybe even further than a year. Her weight has been overall stable.  She has mild intermittent dysphagia to solid foods that is nonprogressive and has been chronic.   Lung cancer DIAGNOSIS: Stage IIIB (T4, N2, M0) non-small cell lung cancer with squamoid differentiation diagnosed in December of 2000.  PRIOR THERAPY:  1) status post left upper lobectomy with lymph node dissection under the care of Dr. Arlyce Dice on 10/25/1999. 2) status post a course of concurrent chemoradiation under the care of Dr.  Beryle Beams completed in 2001    Weight unchanged in 1 year (same scale here in GI)  ROS: complete GI ROS as described in HPI, all other review negative.  Constitutional:  No unintentional weight loss   Past Medical History:  Diagnosis Date  . Allergy   . Anemia   . Anemia   . Aortic insufficiency    a. Mod by echo 05/2015.  . Arthritis    "knees" (07/11/2015)  . Carcinoma of hilus of lung ([place]) 01/20/2012   IIIB NSCL  Right hilar mass compressing esophagus/presenting with dysphagia Rx Surgery/RT/chemo dx January 2001  . Complication of anesthesia    "I had a hard time waking me up w/one of my shoulder surgeries"  . Coronary artery disease    a. Abnormal stress test -> LHc 06/2015 s/p DES to mCX and mLAD, residual D2 disease treated medially.  . Elevated blood pressure   . Erythema nodosum   . Heart murmur   . Insomnia   . lung ca dx'd 10/1999   chemo/xrt comp 05/29/2000  . Myalgia   . Nephrolithiasis   . Obesity   . Pericardial effusion    a. 05/2015 Echo: EF 55-60%, no rwma, Gr 1 DD, no effusion - but an effusion was seen on CT which was felt to be increased in size compared to 12/2014 - pericardium also thickened.   . Pericarditis    a. Dx 05/2015.  [name] Pneumothorax, spontaneous, tension 01/20/2012   October, 2010  . Pulmonary fibrosis ([place]) 01/20/2012   Due to previous surgery and chest radiation for lung cancer  . Restrictive lung disease   . Type II diabetes mellitus ([place])     Past Surgical History:  Procedure Laterality Date  . ABDOMINAL HYSTERECTOMY  2008  . APPENDECTOMY  1969?  [name] CARDIAC CATHETERIZATION N/A 07/11/2015   Procedure: Left Heart Cath and Coronary Angiography;  Surgeon: Jettie Booze, MD;  Location: [facility];  Service: Cardiovascular;  Laterality: N/A;  . CARDIAC CATHETERIZATION  07/11/2015   Procedure: Coronary Stent Intervention;  Surgeon: Jettie Booze, MD;  Location: [facility];  Service: Cardiovascular;;  . CARPAL TUNNEL  RELEASE Bilateral 1998-2005?   right-left  . CARPAL TUNNEL RELEASE Left 2009?   "for the 2nd time"  . COLONOSCOPY    . CORONARY ANGIOPLASTY    . CYSTOSCOPY W/ STONE MANIPULATION  2013  . ESOPHAGOGASTRODUODENOSCOPY (EGD) WITH ESOPHAGEAL DILATION  2012  . FOOT SURGERY Bilateral 1980's   Callous removed   . INNER EAR SURGERY Right ~ 2009  . KNEE ARTHROPLASTY Right 1991  . KNEE ARTHROSCOPY  ~ 2000   LATERAL RELEASE  . LEFT HEART CATH AND CORONARY ANGIOGRAPHY N/A 12/18/2016   Procedure: Left Heart Cath and Coronary Angiography;  Surgeon: Burnell Blanks, MD;  Location: [facility];  Service: Cardiovascular;  Laterality: N/A;  . LUNG CANCER SURGERY Left 2001   "small cell"  . LUNG SURGERY Right 2007   "reinflated it"  . PULMONARY EMBOLISM SURGERY  2007   LUNG COLLAPSE  . SHOULDER ADHESION RELEASE Right ~ 2004  . SHOULDER ARTHROSCOPY W/ ROTATOR CUFF REPAIR Right ~ 2003    Current Outpatient Prescriptions  Medication Sig Dispense Refill  . apixaban (ELIQUIS) 5 MG TABS tablet Take 1 tablet (5 mg total) by mouth 2 (two) times daily. 180 tablet 1  . aspirin EC 81 MG EC tablet Take 1 tablet (81 mg total) by mouth daily. 30 tablet 5  . B-D UF III MINI PEN NEEDLES 31G X 5 MM MISC 2 (two) times daily. as directed  11  . colchicine 0.6 MG tablet TAKE 1 TABLET BY MOUTH 2 (TWO) TIMES DAILY. 180 tablet 0  . diltiazem (CARDIZEM CD) 120 MG 24 hr capsule Take 1 capsule (120 mg total) by mouth daily. 90 capsule 3  . furosemide (LASIX) 20 MG tablet Take 1 tablet (20 mg total) by mouth daily. 90 tablet 3  . HUMALOG MIX 75/25 KWIKPEN (75-25) 100 UNIT/ML Kwikpen Inject 25-35 Units into the skin See admin instructions. Inject 35 units subque in Am and 25 units subque in PM  6  . losartan (COZAAR) 25 MG tablet Take 1 tablet (25 mg total) by mouth daily. 90 tablet 3  . meloxicam (MOBIC) 15 MG tablet Take 1 tablet by mouth daily with breakfast.  1  . metoprolol tartrate (LOPRESSOR) 25 MG tablet  Take 1 tablet (25 mg total) by mouth 2 (two) times daily. 180 tablet 2  . ONE TOUCH ULTRA TEST test strip     . rosuvastatin (CRESTOR) 5 MG tablet Take 1 tablet (5 mg total) by mouth daily. 90 tablet 3   No current facility-administered medications for this visit.     Allergies as of 03/18/2017 - Review Complete 03/18/2017  Allergen Reaction Noted  . Hydrocodone Hives and Itching   . Morphine and related Other (See Comments) 11/09/2011  . Lisinopril Other (See Comments) and Cough 07/25/2015  . Nsaids Other (See Comments) 11/09/2011    Family History  Problem Relation Age of Onset  . Heart disease Father   . Diabetes Father   . Pancreatic cancer Mother   . Diabetes Mother   . Hypertension  Sister        x 3  . Diabetes Sister        x 4  . Diabetes Maternal Grandmother   . Diabetes Paternal Grandmother   . Colon cancer Neg Hx   . Colon polyps Neg Hx   . Rectal cancer Neg Hx   . Stomach cancer Neg Hx   . Esophageal cancer Neg Hx     Social History   Social History  . Marital status: Single    Spouse name: N/A  . Number of children: 0  . Years of education: N/A   Occupational History  . RETIRED    Social History Main Topics  . Smoking status: Former Smoker    Packs/day: 0.25    Years: 5.00    Types: Cigarettes    Quit date: 07/22/1999  . Smokeless tobacco: Never Used  . Alcohol use No  . Drug use: No  . Sexual activity: Not Currently    Birth control/ protection: Other-see comments     Comment: hysterectomy   Other Topics Concern  . Not on file   Social History Narrative  . No narrative on file     Physical Exam: Ht 5' 7.5" (1.715 m) Comment: height measured without shoes  Wt 254 lb 6 oz (115.4 kg)   BMI 39.25 kg/m  Constitutional: generally well-appearing Psychiatric: alert and oriented x3 Abdomen: soft, nontender, nondistended, no obvious ascites, no peritoneal signs, normal bowel sounds No peripheral edema noted in lower  extremities  Assessment and plan: 61 y.o. female with History of lung cancer status post chest radiation chemotherapy and surgery  Her hoarseness seems to be her biggest complaint. I explained this may be late result of radiation to her chest. I think it is most important to rule out recurrence and I'm going to order a chest CT with contrast. If this shows no clear evidence of recurrence then I think the next test would be to proceed with EGD for her mild intermittent dysphasia.   I will alert Dr. Earlie Server about this visit.  Please see the "Patient Instructions" section for addition details about the plan.  Owens Loffler, MD [facility] 03/18/2017, 8:38 AM

## 2017-03-20 ENCOUNTER — Ambulatory Visit (INDEPENDENT_AMBULATORY_CARE_PROVIDER_SITE_OTHER)
Admission: RE | Admit: 2017-03-20 | Discharge: 2017-03-20 | Disposition: A | Discharge status: Home / Self Care | Payer: Medicare Other | Source: Ambulatory Visit | Attending: Gastroenterology | Admitting: Gastroenterology

## 2017-03-20 DIAGNOSIS — R490 Dysphonia: Secondary | ICD-10-CM

## 2017-03-20 DIAGNOSIS — Z85118 Personal history of other malignant neoplasm of bronchus and lung: Secondary | ICD-10-CM | POA: Diagnosis not present

## 2017-03-20 MED ORDER — IOPAMIDOL (ISOVUE-300) INJECTION 61%
80.0000 mL | Freq: Once | INTRAVENOUS | Status: AC | PRN
  Administered 2017-03-20: 80 mL via INTRAVENOUS

## 2017-03-25 ENCOUNTER — Telehealth: Payer: Self-pay | Admitting: Gastroenterology

## 2017-03-25 NOTE — Telephone Encounter (Signed)
See result note from today

## 2017-03-26 ENCOUNTER — Ambulatory Visit (INDEPENDENT_AMBULATORY_CARE_PROVIDER_SITE_OTHER): Payer: Medicare Other | Admitting: Cardiology

## 2017-03-26 VITALS — BP 144/74 | HR 82 | Ht 67.5 in | Wt 253.0 lb

## 2017-03-26 DIAGNOSIS — I480 Paroxysmal atrial fibrillation: Secondary | ICD-10-CM

## 2017-03-26 DIAGNOSIS — I300 Acute nonspecific idiopathic pericarditis: Secondary | ICD-10-CM

## 2017-03-26 DIAGNOSIS — I2510 Atherosclerotic heart disease of native coronary artery without angina pectoris: Secondary | ICD-10-CM

## 2017-03-26 DIAGNOSIS — E782 Mixed hyperlipidemia: Secondary | ICD-10-CM | POA: Diagnosis not present

## 2017-03-26 DIAGNOSIS — I308 Other forms of acute pericarditis: Secondary | ICD-10-CM

## 2017-03-26 DIAGNOSIS — I119 Hypertensive heart disease without heart failure: Secondary | ICD-10-CM | POA: Diagnosis not present

## 2017-03-26 MED ORDER — DILTIAZEM HCL ER COATED BEADS 240 MG PO CP24: 240.0000 mg | ORAL_CAPSULE | Freq: Every day | ORAL | 3 refills | Status: DC

## 2017-03-26 NOTE — Patient Instructions (Signed)
Medication Instructions:   INCREASE YOUR CARDIZEM CD TO 240 MG ONCE DAILY     Testing/Procedures:  CARDIAC MRI WITH/WITHOUT CONTRAST---DR NELSON TO READ THIS STUDY--FOR ACUTE PERICARDITIS     Follow-Up:  4 MONTHS WITH DR [name]       If you need a refill on your cardiac medications before your next appointment, please call your pharmacy.

## 2017-03-26 NOTE — Progress Notes (Signed)
Cardiology Office Note    Date:  03/26/2017   ID:  [name], DOB 11-Feb-1956, MRN [id number]  PCP:  [name], MD  Cardiologist: Dr. [name]  No chief complaint on file.   History of Present Illness:  [name] is a 61 y.o. female  with a history of CAD s/p DES to [facility] and mLAD 07/11/15, chronic pericardial effusion, DM, HTN, remote h/o treated non-small cell lung cancer, pulmonary fibrosis, aortic insufficiency, COPD, anemia and mod AI by echo 05/2015.   ED 08/2016 Afib converted to NSR sent home on cardizem(changed to metoprolol b/c of insurance) and eliquis. CHADSVASC=4.   Seen by Dr. [name] 2/15/18And felt to have acute pericarditis. The patient wanted to have another cardiac catheterization by Dr. [name] 1 to try 2 weeks of Ibuprofen and colchicine for 2 months. She had a rub on exam. She is here for follow-up today. If no improvement consider cardiac cath given history of CAD.  Patient comes in today not feeling any better. She stopped Ibuprofen after 1 week because of stomach upset. She has chest pain and shortness of breath with little activity. She really wants a cardiac cath because she is worried this is her coronary disease. She has chest pain with change of position.  03/26/2017 - this is 3 months follow-up, the patient continues to have chest pain with deep expectoration or positional changes but she has also noticed significantly worsening dyspnea on exertion. She now gets short of breath when walking from room to room. She denies any lower extremity edema no orthopnea or proximal nocturnal dyspnea. Since December when she was hospitalized with episode of atrial fibrillation she had another episode of palpitations that were significant with chest pressure and shortness of breath. These happen while she had sexual encounter and palpitations lasted for about 7 minutes, they resolved with coughing. No syncope. No bleeding with Eliquis.  Past Medical History:  Diagnosis  Date  . Allergy   . Anemia   . Anemia   . Aortic insufficiency    a. Mod by echo 05/2015.  . Arthritis    "knees" (07/11/2015)  . Carcinoma of hilus of lung ([place]) 01/20/2012   IIIB NSCL  Right hilar mass compressing esophagus/presenting with dysphagia Rx Surgery/RT/chemo dx January 2001  . Complication of anesthesia    "I had a hard time waking me up w/one of my shoulder surgeries"  . Coronary artery disease    a. Abnormal stress test -> LHc 06/2015 s/p DES to mCX and mLAD, residual D2 disease treated medially.  . Elevated blood pressure   . Erythema nodosum   . Heart murmur   . Insomnia   . lung ca dx'd 10/1999   chemo/xrt comp 05/29/2000  . Myalgia   . Nephrolithiasis   . Obesity   . Pericardial effusion    a. 05/2015 Echo: EF 55-60%, no rwma, Gr 1 DD, no effusion - but an effusion was seen on CT which was felt to be increased in size compared to 12/2014 - pericardium also thickened.   . Pericarditis    a. Dx 05/2015.  [name] Pneumothorax, spontaneous, tension 01/20/2012   October, 2010  . Pulmonary fibrosis ([place]) 01/20/2012   Due to previous surgery and chest radiation for lung cancer  . Restrictive lung disease   . Type II diabetes mellitus ([place])     Past Surgical History:  Procedure Laterality Date  . ABDOMINAL HYSTERECTOMY  2008  . APPENDECTOMY  1969?  [name] CARDIAC  CATHETERIZATION N/A 07/11/2015   Procedure: Left Heart Cath and Coronary Angiography;  Surgeon: Jettie Booze, MD;  Location: [facility];  Service: Cardiovascular;  Laterality: N/A;  . CARDIAC CATHETERIZATION  07/11/2015   Procedure: Coronary Stent Intervention;  Surgeon: Jettie Booze, MD;  Location: [facility];  Service: Cardiovascular;;  . CARPAL TUNNEL RELEASE Bilateral 1998-2005?   right-left  . CARPAL TUNNEL RELEASE Left 2009?   "for the 2nd time"  . COLONOSCOPY    . CORONARY ANGIOPLASTY    . CYSTOSCOPY W/ STONE MANIPULATION  2013  . ESOPHAGOGASTRODUODENOSCOPY (EGD) WITH ESOPHAGEAL DILATION   2012  . FOOT SURGERY Bilateral 1980's   Callous removed   . INNER EAR SURGERY Right ~ 2009  . KNEE ARTHROPLASTY Right 1991  . KNEE ARTHROSCOPY  ~ 2000   LATERAL RELEASE  . LEFT HEART CATH AND CORONARY ANGIOGRAPHY N/A 12/18/2016   Procedure: Left Heart Cath and Coronary Angiography;  Surgeon: Burnell Blanks, MD;  Location: [facility];  Service: Cardiovascular;  Laterality: N/A;  . LUNG CANCER SURGERY Left 2001   "small cell"  . LUNG SURGERY Right 2007   "reinflated it"  . PULMONARY EMBOLISM SURGERY  2007   LUNG COLLAPSE  . SHOULDER ADHESION RELEASE Right ~ 2004  . SHOULDER ARTHROSCOPY W/ ROTATOR CUFF REPAIR Right ~ 2003   Current Medications: Outpatient Medications Prior to Visit  Medication Sig Dispense Refill  . apixaban (ELIQUIS) 5 MG TABS tablet Take 1 tablet (5 mg total) by mouth 2 (two) times daily. 180 tablet 1  . aspirin EC 81 MG EC tablet Take 1 tablet (81 mg total) by mouth daily. 30 tablet 5  . B-D UF III MINI PEN NEEDLES 31G X 5 MM MISC 2 (two) times daily. as directed  11  . colchicine 0.6 MG tablet TAKE 1 TABLET BY MOUTH 2 (TWO) TIMES DAILY. 180 tablet 0  . furosemide (LASIX) 20 MG tablet Take 1 tablet (20 mg total) by mouth daily. 90 tablet 3  . HUMALOG MIX 75/25 KWIKPEN (75-25) 100 UNIT/ML Kwikpen Inject 25-35 Units into the skin See admin instructions. Inject 35 units subque in Am and 25 units subque in PM  6  . hydrochlorothiazide (MICROZIDE) 12.5 MG capsule Take 12.5 mg by mouth daily.    [name] losartan (COZAAR) 25 MG tablet Take 1 tablet (25 mg total) by mouth daily. 90 tablet 3  . meloxicam (MOBIC) 15 MG tablet Take 1 tablet by mouth daily with breakfast.  1  . metoprolol tartrate (LOPRESSOR) 25 MG tablet Take 1 tablet (25 mg total) by mouth 2 (two) times daily. 180 tablet 2  . ONE TOUCH ULTRA TEST test strip     . rosuvastatin (CRESTOR) 5 MG tablet Take 1 tablet (5 mg total) by mouth daily. 90 tablet 3  . diltiazem (CARDIZEM CD) 120 MG 24 hr capsule  Take 1 capsule (120 mg total) by mouth daily. 90 capsule 3   No facility-administered medications prior to visit.     Allergies:   Hydrocodone; Morphine and related; Lisinopril; and Nsaids   Social History   Social History  . Marital status: Single    Spouse name: N/A  . Number of children: 0  . Years of education: N/A   Occupational History  . RETIRED    Social History Main Topics  . Smoking status: Former Smoker    Packs/day: 0.25    Years: 5.00    Types: Cigarettes    Quit  date: 07/22/1999  . Smokeless tobacco: Never Used  . Alcohol use No  . Drug use: No  . Sexual activity: Not Currently    Birth control/ protection: Other-see comments     Comment: hysterectomy   Other Topics Concern  . Not on file   Social History Narrative  . No narrative on file    Family History:  The patient's family history includes Diabetes in her father, maternal grandmother, mother, paternal grandmother, and sister; Heart disease in her father; Hypertension in her sister; Pancreatic cancer in her mother.   ROS:   Please see the history of present illness.    Review of Systems  Constitution: Negative.  HENT: Negative.   Eyes: Negative.   Cardiovascular: Positive for chest pain and leg swelling.  Respiratory: Negative.   Hematologic/Lymphatic: Negative.   Musculoskeletal: Negative.  Negative for joint pain.  Gastrointestinal: Negative.   Genitourinary: Negative.   Neurological: Negative.    All other systems reviewed and are negative.   PHYSICAL EXAM:   VS:  BP (!) 144/74   Pulse 82   Ht 5' 7.5" (1.715 m)   Wt 253 lb (114.8 kg)   SpO2 98%   BMI 39.04 kg/m   Physical Exam  GEN: Obese, in no acute distress  HEENT: normal  Neck: no JVD, carotid bruits, or masses Cardiac: RRR; loud rub on exam, 1 to 2/6 diastolic murmur at the left sternal border Respiratory:  Decreased breath sounds with some anterior wheezing  GI: soft, nontender, nondistended, + BS Ext: without cyanosis,  clubbing, or edema, Good distal pulses bilaterally MS: no deformity or atrophy  Skin: warm and dry, no rash Neuro:  Alert and Oriented x 3, Strength and sensation are intact Psych: euthymic mood, full affect  Wt Readings from Last 3 Encounters:  03/26/17 253 lb (114.8 kg)  03/18/17 254 lb 6 oz (115.4 kg)  01/06/17 252 lb (114.3 kg)    Studies/Labs Reviewed:   EKG:  EKG is ordered today.  The ekg ordered today demonstrates Normal sinus rhythm with ST elevation consistent with pericarditis  Recent Labs: 09/09/2016: ALT 23 09/17/2016: TSH 3.99 12/16/2016: Hemoglobin 11.4; Platelets 337; Potassium 4.0; Sodium 145 03/18/2017: BUN 25; Creatinine, Ser 0.82   Lipid Panel    Component Value Date/Time   CHOL 104 (L) 08/04/2015 1127   TRIG 74 08/04/2015 1127   HDL 47 08/04/2015 1127   CHOLHDL 2.2 08/04/2015 1127   VLDL 15 08/04/2015 1127   LDLCALC 42 08/04/2015 1127   Additional studies/ records that were reviewed today include:    Nuclear stress test: 11/29/2015  Nuclear stress EF: 62%.  There is a small defect of mild severity present in the mid anteroseptal location. The defect is non-reversible and consistent with breast attenuation artifact vs. prior infarct. No ischemia noted.  This is a low risk study.  The left ventricular ejection fraction is normal (55-65%).  Compared to study of 2016, there is now normal perfusion in the apical anterior and apical myocardium and the defect in the mid anteroseptum is fixed.   TTE: 09/18/2016   - Left ventricle: The cavity size was normal. Wall thickness was   normal. Systolic function was normal. The estimated ejection   fraction was in the range of 55% to 60%. Wall motion was normal;   there were no regional wall motion abnormalities. Doppler   parameters are consistent with abnormal left ventricular   relaxation (grade 1 diastolic dysfunction). - Aortic valve: There was no stenosis. There  was mild to moderate   regurgitation. -  Mitral valve: There was trivial regurgitation. - Right ventricle: The cavity size was normal. Systolic function   was normal. - Right atrium: The atrium was mildly dilated. - Pulmonic valve: Turbulence across the pulmonic valve but peak   gradient 15 mmHg does not suggest significant stenosis. There was   mild regurgitation. Left atrium:  The atrium was normal in size. - Pulmonary arteries: PA peak pressure: 28 mm Hg (S). - Inferior vena cava: The vessel was normal in size. The   respirophasic diameter changes were in the normal range (>= 50%),   consistent with normal central venous pressure. - Pericardium, extracardiac: Small pericardial effusion, no   tamponade.   Impressions: - Normal LV size with EF 55-60%. Normal RV size and systolic   function. Mild to moderate aortic insufficiency. Turbulence   across the pulmonic valve but no significant stenosis by doppler   measurement.   LHC 11/2016 1. Double vessel CAD, stable.  2. Patent stent in the proximal to mid LAD with no restenosis 3. Patent stent in the mid Circumflex with no restenosis 4. The Diagonal branch is a small caliber vessel that is jailed by the LAD stent. There is severe disease in this small branch but PCI is not a good option 5. Small non-dominant RCA 6. Normal LV systolic function  Recommendations: Continue medical management of CAD.    ASSESSMENT:    1. Other acute pericarditis   2. Benign hypertensive heart disease without heart failure   3. Acute idiopathic pericarditis   4. Paroxysmal A-fib ([place])   5. Coronary artery disease involving native coronary artery of native heart without angina pectoris   6. Mixed hyperlipidemia     PLAN:  In order of problems listed above:   1. Acute idiopathic pericarditis - The patient continues to have significant positional chest pain with loud pericardial rub on  physical exam, she now has worsening dyspnea on exertion, the patient underwent cardiac catheterization  with patent both stents and otherwise minor CAD in 11/2016, I am suspicious for constrictive pericarditis, I will order cardiac MRI to further evaluate. If positive we will schedule right-sided cardiac cath.  2.  CAD: s/p PCI to [facility] and mLAD 06/2015. She underwent nuclear stress test on 09/27/2016 was negative for ischemia and showed normal LVEF. Continue ASA, statin and BB.  cardiac catheterization in February 2018 showed patent stents.  3. H/o Pericardial Effusion: Repeat echocardiogram shows only small amount effusion with no signs of tamponade.   4. New Onset Atrial Fibrillation: spontaneous conversion to NSR on IV cardizem in the ED on 09/09/16.  she had one episode of palpitations last week, we will increase Cardizem CD from 120-240 mg daily. CHADS-VASc 3, continue Eliquis for a/c. No bleeding.  5. Hypertension - we will increase Cardizem CD from 120-240 mg daily.  6. Acute on chronic diastolic CHF  - euvolemic on Lasix 20 mg po daily.   7. HLD: Continue Crestor.   8. H/o Lung Cancer: Rx Surgery/RT/chemo dx January 2001  9. Nonrheumatic aortic valve insufficiency - AI 1-2 on echo in 08/2016, we will reevaluate on CMR   Medication Adjustments/Labs and Tests Ordered: Current medicines are reviewed at length with the patient today.  Concerns regarding medicines are outlined above.  Medication changes, Labs and Tests ordered today are listed in the Patient Instructions below. Patient Instructions  Medication Instructions:   INCREASE YOUR CARDIZEM CD TO 240 MG ONCE DAILY  Testing/Procedures:  CARDIAC MRI WITH/WITHOUT CONTRAST---DR Nilsa Macht TO READ THIS STUDY--FOR ACUTE PERICARDITIS     Follow-Up:  4 MONTHS WITH DR [name]       If you need a refill on your cardiac medications before your next appointment, please call your pharmacy.      Signed, Ena Dawley, MD  03/26/2017 12:50 PM    [facility] [address], [place], [place]   [address] Phone: [phone]; Fax: [phone]

## 2017-03-27 ENCOUNTER — Telehealth: Payer: Self-pay | Admitting: Gastroenterology

## 2017-03-27 ENCOUNTER — Encounter: Payer: Self-pay | Admitting: Cardiology

## 2017-03-28 ENCOUNTER — Telehealth: Payer: Self-pay

## 2017-03-28 NOTE — Telephone Encounter (Signed)
Pt takes Eliquis for afib with CHADS2 score of 2 (HTN, DM) and CHADS2VASc of 4 (CAD, sex, HTN, DM). Ok to hold Eliquis for 2 days as requested prior to procedure. Clearance routed to Katina Degree, RN.

## 2017-03-28 NOTE — Telephone Encounter (Signed)
Patient calling in regarding this. Best # [phone]

## 2017-03-28 NOTE — Telephone Encounter (Signed)
RE: [name] DOB: 1956-08-29 MRN: [id number]   Dear Dr [name],    We have scheduled the above patient for an endoscopic procedure. Our records show that she is on anticoagulation therapy.   Please advise as to how long the patient may come off her therapy of Eliquis prior to the procedure, which is scheduled for 05/06/17.  Dr Ardis Hughs is requesting a 2 day hold.   Please fax back/ or route to Patty at [phone].   Sincerely,    Jeoffrey Massed RN

## 2017-03-28 NOTE — Telephone Encounter (Signed)
See alternate result note.  ?

## 2017-03-28 NOTE — Telephone Encounter (Signed)
Megan, please advice, thank you ! KN

## 2017-03-31 NOTE — Telephone Encounter (Signed)
Pt set up for previsit and will be given Eliquis instructions

## 2017-04-04 ENCOUNTER — Other Ambulatory Visit: Payer: Self-pay | Admitting: Cardiology

## 2017-04-12 ENCOUNTER — Observation Stay (HOSPITAL_COMMUNITY)
Admission: EM | Admit: 2017-04-12 | Discharge: 2017-04-14 | Disposition: A | Discharge status: Home / Self Care | Payer: Medicare Other | Attending: Internal Medicine | Admitting: Internal Medicine

## 2017-04-12 ENCOUNTER — Encounter (HOSPITAL_COMMUNITY): Payer: Self-pay

## 2017-04-12 ENCOUNTER — Emergency Department (HOSPITAL_COMMUNITY): Payer: Medicare Other

## 2017-04-12 DIAGNOSIS — E119 Type 2 diabetes mellitus without complications: Secondary | ICD-10-CM | POA: Insufficient documentation

## 2017-04-12 DIAGNOSIS — R0789 Other chest pain: Secondary | ICD-10-CM | POA: Diagnosis present

## 2017-04-12 DIAGNOSIS — Z7901 Long term (current) use of anticoagulants: Secondary | ICD-10-CM | POA: Diagnosis not present

## 2017-04-12 DIAGNOSIS — I2510 Atherosclerotic heart disease of native coronary artery without angina pectoris: Secondary | ICD-10-CM | POA: Diagnosis not present

## 2017-04-12 DIAGNOSIS — Z794 Long term (current) use of insulin: Secondary | ICD-10-CM | POA: Diagnosis not present

## 2017-04-12 DIAGNOSIS — Z79899 Other long term (current) drug therapy: Secondary | ICD-10-CM | POA: Insufficient documentation

## 2017-04-12 DIAGNOSIS — Z85118 Personal history of other malignant neoplasm of bronchus and lung: Secondary | ICD-10-CM | POA: Insufficient documentation

## 2017-04-12 DIAGNOSIS — Z955 Presence of coronary angioplasty implant and graft: Secondary | ICD-10-CM | POA: Insufficient documentation

## 2017-04-12 DIAGNOSIS — I300 Acute nonspecific idiopathic pericarditis: Secondary | ICD-10-CM

## 2017-04-12 DIAGNOSIS — Z7982 Long term (current) use of aspirin: Secondary | ICD-10-CM | POA: Diagnosis not present

## 2017-04-12 DIAGNOSIS — Z7984 Long term (current) use of oral hypoglycemic drugs: Secondary | ICD-10-CM | POA: Insufficient documentation

## 2017-04-12 DIAGNOSIS — Z87891 Personal history of nicotine dependence: Secondary | ICD-10-CM | POA: Diagnosis not present

## 2017-04-12 DIAGNOSIS — I319 Disease of pericardium, unspecified: Secondary | ICD-10-CM | POA: Diagnosis present

## 2017-04-12 DIAGNOSIS — I10 Essential (primary) hypertension: Secondary | ICD-10-CM | POA: Diagnosis present

## 2017-04-12 DIAGNOSIS — I4891 Unspecified atrial fibrillation: Principal | ICD-10-CM | POA: Diagnosis present

## 2017-04-12 DIAGNOSIS — R079 Chest pain, unspecified: Secondary | ICD-10-CM

## 2017-04-12 DIAGNOSIS — I200 Unstable angina: Secondary | ICD-10-CM | POA: Diagnosis present

## 2017-04-12 LAB — CBC
HCT: 41.5 % (ref 36.0–46.0)
Hemoglobin: 13.4 g/dL (ref 12.0–15.0)
MCH: 30.5 pg (ref 26.0–34.0)
MCHC: 32.3 g/dL (ref 30.0–36.0)
MCV: 94.5 fL (ref 78.0–100.0)
Platelets: 255 10*3/uL (ref 150–400)
RBC: 4.39 MIL/uL (ref 3.87–5.11)
RDW: 12.4 % (ref 11.5–15.5)
WBC: 7.5 10*3/uL (ref 4.0–10.5)

## 2017-04-12 LAB — BASIC METABOLIC PANEL
Anion gap: 7 (ref 5–15)
BUN: 24 mg/dL — ABNORMAL HIGH (ref 6–20)
CO2: 28 mmol/L (ref 22–32)
Calcium: 9.5 mg/dL (ref 8.9–10.3)
Chloride: 104 mmol/L (ref 101–111)
Creatinine, Ser: 0.95 mg/dL (ref 0.44–1.00)
GFR calc Af Amer: >60 mL/min (ref >60)
GFR calc non Af Amer: >60 mL/min (ref >60)
Glucose, Bld: 115 mg/dL — ABNORMAL HIGH (ref 65–99)
Potassium: 4.1 mmol/L (ref 3.5–5.1)
Sodium: 139 mmol/L (ref 135–145)

## 2017-04-12 LAB — GLUCOSE, CAPILLARY
Glucose-Capillary: 80 mg/dL (ref 65–99)
Glucose-Capillary: 140 mg/dL — ABNORMAL HIGH (ref 65–99)

## 2017-04-12 LAB — C-REACTIVE PROTEIN: CRP: <0.8 mg/dL (ref <1.0)

## 2017-04-12 LAB — TROPONIN I
Troponin I: <0.03 ng/mL (ref <0.03)
Troponin I: <0.03 ng/mL (ref <0.03)
Troponin I: <0.03 ng/mL (ref <0.03)

## 2017-04-12 LAB — BRAIN NATRIURETIC PEPTIDE: B Natriuretic Peptide: 59.8 pg/mL (ref 0.0–100.0)

## 2017-04-12 LAB — TSH: TSH: 2.776 u[IU]/mL (ref 0.350–4.500)

## 2017-04-12 MED ORDER — DILTIAZEM HCL ER COATED BEADS 240 MG PO CP24
240.0000 mg | ORAL_CAPSULE | Freq: Every day | ORAL | Status: DC
  Administered 2017-04-12 – 2017-04-14 (×3): 240 mg via ORAL
  Filled 2017-04-12 (×3): qty 1

## 2017-04-12 MED ORDER — LOSARTAN POTASSIUM 25 MG PO TABS
25.0000 mg | ORAL_TABLET | Freq: Every day | ORAL | Status: DC
  Administered 2017-04-12 – 2017-04-14 (×3): 25 mg via ORAL
  Filled 2017-04-12 (×3): qty 1

## 2017-04-12 MED ORDER — FUROSEMIDE 20 MG PO TABS
20.0000 mg | ORAL_TABLET | Freq: Every day | ORAL | Status: DC
  Administered 2017-04-12 – 2017-04-14 (×3): 20 mg via ORAL
  Filled 2017-04-12 (×3): qty 1

## 2017-04-12 MED ORDER — COLCHICINE 0.6 MG PO TABS
0.6000 mg | ORAL_TABLET | Freq: Once | ORAL | Status: AC
  Administered 2017-04-12: 0.6 mg via ORAL
  Filled 2017-04-12: qty 1

## 2017-04-12 MED ORDER — ROSUVASTATIN CALCIUM 10 MG PO TABS
5.0000 mg | ORAL_TABLET | Freq: Every day | ORAL | Status: DC
  Administered 2017-04-12 – 2017-04-14 (×3): 5 mg via ORAL
  Filled 2017-04-12 (×3): qty 1

## 2017-04-12 MED ORDER — HYDROCHLOROTHIAZIDE 12.5 MG PO CAPS
12.5000 mg | ORAL_CAPSULE | Freq: Every day | ORAL | Status: DC
  Administered 2017-04-12 – 2017-04-14 (×3): 12.5 mg via ORAL
  Filled 2017-04-12 (×3): qty 1

## 2017-04-12 MED ORDER — INDOMETHACIN 25 MG PO CAPS
50.0000 mg | ORAL_CAPSULE | Freq: Two times a day (BID) | ORAL | Status: DC
  Administered 2017-04-12 – 2017-04-14 (×4): 50 mg via ORAL
  Filled 2017-04-12 (×4): qty 2

## 2017-04-12 MED ORDER — PANTOPRAZOLE SODIUM 40 MG PO TBEC
40.0000 mg | DELAYED_RELEASE_TABLET | Freq: Every day | ORAL | Status: DC
Start: 2017-04-12 — End: 2017-04-14
  Administered 2017-04-12 – 2017-04-14 (×3): 40 mg via ORAL
  Filled 2017-04-12 (×3): qty 1

## 2017-04-12 MED ORDER — INSULIN ASPART PROT & ASPART (70-30 MIX) 100 UNIT/ML ~~LOC~~ SUSP
25.0000 [IU] | Freq: Every day | SUBCUTANEOUS | Status: DC
  Administered 2017-04-12 – 2017-04-14 (×3): 25 [IU] via SUBCUTANEOUS
  Filled 2017-04-12: qty 10

## 2017-04-12 MED ORDER — SODIUM CHLORIDE 0.9% FLUSH
3.0000 mL | Freq: Two times a day (BID) | INTRAVENOUS | Status: DC
  Administered 2017-04-12 – 2017-04-14 (×5): 3 mL via INTRAVENOUS

## 2017-04-12 MED ORDER — INSULIN LISPRO PROT & LISPRO (75-25 MIX) 100 UNIT/ML KWIKPEN: 25.0000 [IU] | PEN_INJECTOR | SUBCUTANEOUS | Status: DC

## 2017-04-12 MED ORDER — INSULIN ASPART 100 UNIT/ML ~~LOC~~ SOLN
0.0000 [IU] | Freq: Three times a day (TID) | SUBCUTANEOUS | Status: DC
  Administered 2017-04-13: 3 [IU] via SUBCUTANEOUS
  Administered 2017-04-14: 11 [IU] via SUBCUTANEOUS
  Administered 2017-04-14: 2 [IU] via SUBCUTANEOUS

## 2017-04-12 MED ORDER — APIXABAN 5 MG PO TABS
5.0000 mg | ORAL_TABLET | Freq: Two times a day (BID) | ORAL | Status: DC
  Administered 2017-04-12 – 2017-04-14 (×4): 5 mg via ORAL
  Filled 2017-04-12 (×5): qty 1

## 2017-04-12 MED ORDER — ACETAMINOPHEN 500 MG PO TABS
500.0000 mg | ORAL_TABLET | Freq: Four times a day (QID) | ORAL | Status: DC | PRN
  Administered 2017-04-12 – 2017-04-14 (×3): 1000 mg via ORAL
  Filled 2017-04-12 (×3): qty 2

## 2017-04-12 MED ORDER — INSULIN ASPART PROT & ASPART (70-30 MIX) 100 UNIT/ML ~~LOC~~ SUSP
35.0000 [IU] | Freq: Once | SUBCUTANEOUS | Status: AC
  Administered 2017-04-12: 35 [IU] via SUBCUTANEOUS
  Filled 2017-04-12: qty 10

## 2017-04-12 MED ORDER — ASPIRIN 81 MG PO TBEC: 81.0000 mg | DELAYED_RELEASE_TABLET | Freq: Every day | ORAL | Status: DC

## 2017-04-12 MED ORDER — INSULIN ASPART PROT & ASPART (70-30 MIX) 100 UNIT/ML ~~LOC~~ SUSP
35.0000 [IU] | Freq: Every day | SUBCUTANEOUS | Status: DC
  Administered 2017-04-13 – 2017-04-14 (×2): 35 [IU] via SUBCUTANEOUS

## 2017-04-12 MED ORDER — INSULIN ASPART 100 UNIT/ML ~~LOC~~ SOLN: 0.0000 [IU] | Freq: Every day | SUBCUTANEOUS | Status: DC

## 2017-04-12 MED ORDER — NITROGLYCERIN 0.4 MG SL SUBL
0.4000 mg | SUBLINGUAL_TABLET | SUBLINGUAL | Status: AC | PRN
  Administered 2017-04-12 (×3): 0.4 mg via SUBLINGUAL
  Filled 2017-04-12 (×2): qty 1

## 2017-04-12 MED ORDER — MELOXICAM 15 MG PO TABS
15.0000 mg | ORAL_TABLET | ORAL | Status: AC
  Administered 2017-04-12: 15 mg via ORAL
  Filled 2017-04-12: qty 1

## 2017-04-12 MED ORDER — ASPIRIN EC 81 MG PO TBEC: 81.0000 mg | DELAYED_RELEASE_TABLET | Freq: Every day | ORAL | Status: DC

## 2017-04-12 MED ORDER — COLCHICINE 0.6 MG PO TABS
0.6000 mg | ORAL_TABLET | Freq: Two times a day (BID) | ORAL | Status: DC
  Administered 2017-04-12 – 2017-04-14 (×4): 0.6 mg via ORAL
  Filled 2017-04-12 (×5): qty 1

## 2017-04-12 MED ORDER — ASPIRIN 81 MG PO CHEW
324.0000 mg | CHEWABLE_TABLET | Freq: Once | ORAL | Status: AC
  Administered 2017-04-12: 324 mg via ORAL
  Filled 2017-04-12: qty 4

## 2017-04-12 MED ORDER — ASPIRIN EC 81 MG PO TBEC
81.0000 mg | DELAYED_RELEASE_TABLET | Freq: Every day | ORAL | Status: DC
  Administered 2017-04-13 – 2017-04-14 (×2): 81 mg via ORAL
  Filled 2017-04-12 (×2): qty 1

## 2017-04-12 MED ORDER — METOPROLOL TARTRATE 25 MG PO TABS
25.0000 mg | ORAL_TABLET | Freq: Two times a day (BID) | ORAL | Status: DC
  Administered 2017-04-12 – 2017-04-14 (×4): 25 mg via ORAL
  Filled 2017-04-12 (×5): qty 1

## 2017-04-12 NOTE — ED Provider Notes (Signed)
[facility] DEPT Provider Note   CSN: [id number] Arrival date & time: 04/12/17  0211     History   Chief Complaint Chief Complaint  Patient presents with  . Chest Pain    HPI [name] is a 61 y.o. female.  HPI  61 year old female with a history of aortic insufficiency, CAD with prior stent and prior A. fib presents with chest pain. She states this started around 12:30 AM. She states she also started having palpitations and felt like she was back in A. fib. The chest pain feels like a pressure and squeezing. The palpitations seem to have resolved but she still has some chest pain. She describes it as a 7 out of 10 in setting of a 10/10 that it was earlier. There is associated shortness of breath and she felt diaphoretic with a watery mouth. She did not have any vomiting. Did not take anything for this.  Past Medical History:  Diagnosis Date  . Allergy   . Anemia   . Anemia   . Aortic insufficiency    a. Mod by echo 05/2015.  . Arthritis    "knees" (07/11/2015)  . 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 Complication of anesthesia    "I had a hard time waking me up w/one of my shoulder surgeries"  . Coronary artery disease    a. Abnormal stress test -> LHc 06/2015 s/p DES to mCX and mLAD, residual D2 disease treated medially.  . Elevated blood pressure   . Erythema nodosum   . Heart murmur   . Insomnia   . lung ca dx'd 10/1999   chemo/xrt comp 05/29/2000  . Myalgia   . Nephrolithiasis   . Obesity   . Pericardial effusion    a. 05/2015 Echo: EF 55-60%, no rwma, Gr 1 DD, no effusion - but an effusion was seen on CT which was felt to be increased in size compared to 12/2014 - pericardium also thickened.   . Pericarditis    a. Dx 05/2015.  [name] Pneumothorax, spontaneous, tension 01/20/2012   October, 2010  . Pulmonary fibrosis ([place]) 01/20/2012   Due to previous  surgery and chest radiation for lung cancer  . Restrictive lung disease   . Type II diabetes mellitus [facility])     Patient Active Problem List   Diagnosis Date Noted  . Unstable angina ([place]) 04/12/2017  . Atrial fibrillation with RVR ([place])   . Hyperlipidemia 08/07/2015  . CAD S/P percutaneous coronary angioplasty 07/25/2015  . Hypertension, essential   . Aortic insufficiency   . Stented coronary artery   . Abnormal nuclear stress test 07/11/2015  . Abnormal stress test 07/04/2015  . Pericarditis   . Controlled type 2 diabetes mellitus without complication, with long-term current use of insulin ([place])   . Pericardial effusion   . Chest pain 05/30/2015  . 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 Open angle with borderline findings and low glaucoma risk in both eyes 05/23/2015  . Restrictive lung disease 03/14/2015  . 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 Insomnia   . Obesity   . Anemia   . Kidney stones   . UNDIAGNOSED CARDIAC MURMURS 02/24/2009  . Lung cancer ([place]) 09/20/2008  . DYSPNEA 09/20/2008  . DIABETES MELLITUS, TYPE II 06/19/2007  Past Surgical History:  Procedure Laterality Date  . ABDOMINAL HYSTERECTOMY  2008  . APPENDECTOMY  1969?  [name] CARDIAC CATHETERIZATION N/A 07/11/2015   Procedure: Left Heart Cath and Coronary Angiography;  Surgeon: Jettie Booze, MD;  Location: [facility];  Service: Cardiovascular;  Laterality: N/A;  . CARDIAC CATHETERIZATION  07/11/2015   Procedure: Coronary Stent Intervention;  Surgeon: Jettie Booze, MD;  Location: [facility];  Service: Cardiovascular;;  . CARPAL TUNNEL RELEASE Bilateral 1998-2005?   right-left  . CARPAL TUNNEL RELEASE Left 2009?   "for the 2nd time"  . COLONOSCOPY    . CORONARY ANGIOPLASTY    . CYSTOSCOPY W/ STONE MANIPULATION  2013  . ESOPHAGOGASTRODUODENOSCOPY (EGD)  WITH ESOPHAGEAL DILATION  2012  . FOOT SURGERY Bilateral 1980's   Callous removed   . INNER EAR SURGERY Right ~ 2009  . KNEE ARTHROPLASTY Right 1991  . KNEE ARTHROSCOPY  ~ 2000   LATERAL RELEASE  . LEFT HEART CATH AND CORONARY ANGIOGRAPHY N/A 12/18/2016   Procedure: Left Heart Cath and Coronary Angiography;  Surgeon: Burnell Blanks, MD;  Location: [facility];  Service: Cardiovascular;  Laterality: N/A;  . LUNG CANCER SURGERY Left 2001   "small cell"  . LUNG SURGERY Right 2007   "reinflated it"  . PULMONARY EMBOLISM SURGERY  2007   LUNG COLLAPSE  . SHOULDER ADHESION RELEASE Right ~ 2004  . SHOULDER ARTHROSCOPY W/ ROTATOR CUFF REPAIR Right ~ 2003    OB History    No data available       Home Medications    Prior to Admission medications   Medication Sig Start Date End Date Taking? Authorizing Provider  apixaban (ELIQUIS) 5 MG TABS tablet Take 1 tablet (5 mg total) by mouth 2 (two) times daily. 12/05/16  Yes Dorothy Spark, MD  aspirin EC 81 MG EC tablet Take 1 tablet (81 mg total) by mouth daily. 06/01/15  Yes Haney, Alyssa A, MD  colchicine 0.6 MG tablet TAKE 1 TABLET BY MOUTH 2 (TWO) TIMES DAILY. 02/03/17  Yes Dorothy Spark, MD  diltiazem (CARDIZEM CD) 240 MG 24 hr capsule Take 1 capsule (240 mg total) by mouth daily. 03/26/17 06/24/17 Yes Dorothy Spark, MD  furosemide (LASIX) 20 MG tablet Take 1 tablet (20 mg total) by mouth daily. 10/04/16  Yes Dorothy Spark, MD  HUMALOG MIX 75/25 KWIKPEN (75-25) 100 UNIT/ML Kwikpen Inject 25-35 Units into the skin 2 (two) times daily. Inject 35 units subque in Am and 25 units subque in PM 12/22/15  Yes [provider]  hydrochlorothiazide (MICROZIDE) 12.5 MG capsule Take 12.5 mg by mouth daily.   Yes [provider]  losartan (COZAAR) 25 MG tablet Take 1 tablet (25 mg total) by mouth daily. 07/29/16  Yes Dorothy Spark, MD  meloxicam (MOBIC) 15 MG tablet Take 1 tablet by mouth daily with breakfast.  02/20/17  Yes [provider]  metoprolol tartrate (LOPRESSOR) 25 MG tablet Take 1 tablet (25 mg total) by mouth 2 (two) times daily. 10/18/16  Yes Dorothy Spark, MD  rosuvastatin (CRESTOR) 5 MG tablet Take 1 tablet (5 mg total) by mouth daily. 02/03/17 05/04/17 Yes Dorothy Spark, MD    Family History Family History  Problem Relation Age of Onset  . Heart disease Father   . Diabetes Father   . Pancreatic cancer Mother   . Diabetes Mother   . Hypertension Sister  x 3  . Diabetes Sister        x 4  . Diabetes Maternal Grandmother   . Diabetes Paternal Grandmother   . Colon cancer Neg Hx   . Colon polyps Neg Hx   . Rectal cancer Neg Hx   . Stomach cancer Neg Hx   . Esophageal cancer Neg Hx     Social History Social History  Substance Use Topics  . Smoking status: Former Smoker    Packs/day: 0.25    Years: 5.00    Types: Cigarettes    Quit date: 07/22/1999  . Smokeless tobacco: Never Used  . Alcohol use No     Allergies   Hydrocodone; Morphine and related; Lisinopril; and Nsaids   Review of Systems Review of Systems  Constitutional: Positive for diaphoresis.  Respiratory: Positive for shortness of breath.   Cardiovascular: Positive for chest pain and palpitations.  Gastrointestinal: Positive for nausea. Negative for abdominal pain and vomiting.  All other systems reviewed and are negative.    Physical Exam Updated Vital Signs BP (!) 148/61   Pulse 70   Temp 98 F (36.7 C) (Oral)   Resp (!) 21   SpO2 99%   Physical Exam  Constitutional: She is oriented to person, place, and time. She appears well-developed and well-nourished.  HENT:  Head: Normocephalic and atraumatic.  Right Ear: External ear normal.  Left Ear: External ear normal.  Nose: Nose normal.  Eyes: Right eye exhibits no discharge. Left eye exhibits no discharge.  Cardiovascular: Normal rate and regular rhythm.   Murmur heard. Pulmonary/Chest: Effort normal and breath  sounds normal. She exhibits tenderness.  Abdominal: Soft. There is no tenderness.  Neurological: She is alert and oriented to person, place, and time.  Skin: Skin is warm and dry.  Nursing note and vitals reviewed.    ED Treatments / Results  Labs (all labs ordered are listed, but only abnormal results are displayed) Labs Reviewed  BASIC METABOLIC PANEL - Abnormal; Notable for the following:       Result Value   Glucose, Bld 115 (*)    BUN 24 (*)    All other components within normal limits  CBC  TROPONIN I    EKG  EKG Interpretation  Date/Time:  Saturday April 12 2017 02:17:24 EDT Ventricular Rate:  140 PR Interval:    QRS Duration: 94 QT Interval:  332 QTC Calculation: 506 R Axis:   63 Text Interpretation:  Atrial fibrillation with rapid ventricular response Abnormal ECG Confirmed by Sherwood Gambler [phone]) on 04/12/2017 2:30:18 AM       EKG Interpretation  Date/Time:  Saturday April 12 2017 03:32:03 EDT Ventricular Rate:  84 PR Interval:    QRS Duration: 101 QT Interval:  405 QTC Calculation: 479 R Axis:   53 Text Interpretation:  Sinus rhythm no acute ST/T changes afib has resolved Confirmed by Sherwood Gambler [phone]) on 04/12/2017 4:32:13 AM        Radiology Dg Chest 2 View  Result Date: 04/12/2017 CLINICAL DATA:  Chest pain. EXAM: CHEST  2 VIEW COMPARISON:  Chest CT 3 weeks prior 03/20/2017. Radiographs 10/04/2016 FINDINGS: Chronic volume loss in the left lung with surgical clips in left apical pleuroparenchymal scarring. Chain sutures in the right upper lung. Stable cardiomegaly and mediastinal contours, a coronary stent is seen. No pulmonary edema, pleural effusion, pneumothorax or focal airspace disease. Chronic widening of the right acromioclavicular joint. IMPRESSION: No acute abnormality.  Stable cardiomegaly and postsurgical change. Electronically Signed  By: Jeb Levering M.D.   On: 04/12/2017 02:50    Procedures Procedures (including critical  care time)  Medications Ordered in ED Medications  nitroGLYCERIN (NITROSTAT) SL tablet 0.4 mg (0.4 mg Sublingual Given 04/12/17 0335)  aspirin chewable tablet 324 mg (324 mg Oral Given 04/12/17 0325)     Initial Impression / Assessment and Plan / ED Course  I have reviewed the triage vital signs and the nursing notes.  Pertinent labs & imaging results that were available during my care of the patient were reviewed by me and considered in my medical decision making (see chart for details).     Patient's Afib resolved prior to treatment. However CP remains. Has chronic pericarditis, unclear if this is cause. Initial trop negative. CP similar to when she received a stent 2 years ago. Admit to cards for further workup/care.  Final Clinical Impressions(s) / ED Diagnoses   Final diagnoses:  Chest pressure  Atrial fibrillation with RVR (HCC)    New Prescriptions New Prescriptions   No medications on file     Sherwood Gambler, MD 04/12/17 [phone]

## 2017-04-12 NOTE — ED Triage Notes (Signed)
Pt complaining of central chest pain and SOB. Pt states hx of afib. Pt states on Eliquis for same. Pt denies any lightheadedness or dizziness.

## 2017-04-12 NOTE — ED Notes (Signed)
Lunch tray ordered; 2 gram sodium diet

## 2017-04-12 NOTE — ED Notes (Signed)
Hospitalist at bedside at this time 

## 2017-04-12 NOTE — H&P (Addendum)
CARDIOLOGY OBSERVATION HISTORY AND PHYSICAL EXAMINATION NOTE  Patient ID: [name] MRN: [id number], DOB/AGE: [birth year]   Admit date: 04/12/2017   Primary Physician: [name], MD Primary Cardiologist: Ena Dawley MD  Reason for admission: Chest pain   HPI: This is a 61 y.o. female with history of CAD s/p DES to [facility] and mLAD 07/11/15, chronic pericardial effusion, DM, HTN, remote h/o treated non-small cell lung cancer, pulmonary fibrosis, aortic insufficiency, COPD, anemia and mod AI by echo 05/2015.  Patient was recently seen as outpatient by Dr. [name] and at that time, her cardiazem was increased to 240 mg daily. Patient had an episode of acute pericarditis in 11/2016.  The CP she describes is as chest pressure usually occurs with SOB and at rest associated with irregular palpitations. Symptoms get worse with deep breathing. These symptoms started when she was vacuuming the house so she had to stop. Today when she presented, she was in afib with RVR which improved with pain control and rate control in the ED. Patient also has an MRI ordered which was due on Friday. She was also planned to be seen by the EP physician for afib. Pt is being admitted for observation for ACS r/o. She has been having a dry cough since last 3 days.   Problem List: Past Medical History:  Diagnosis Date  . Allergy   . Anemia   . Anemia   . Aortic insufficiency    a. Mod by echo 05/2015.  . Arthritis    "knees" (07/11/2015)  . Carcinoma of hilus of lung ([place]) 01/20/2012   IIIB NSCL  Right hilar mass compressing esophagus/presenting with dysphagia Rx Surgery/RT/chemo dx January 2001  . Complication of anesthesia    "I had a hard time waking me up w/one of my shoulder surgeries"  . Coronary artery disease    a. Abnormal stress test -> LHc 06/2015 s/p DES to mCX and mLAD, residual D2 disease treated medially.  . Elevated blood pressure   . Erythema nodosum   . Heart murmur   . Insomnia   . lung ca  dx'd 10/1999   chemo/xrt comp 05/29/2000  . Myalgia   . Nephrolithiasis   . Obesity   . Pericardial effusion    a. 05/2015 Echo: EF 55-60%, no rwma, Gr 1 DD, no effusion - but an effusion was seen on CT which was felt to be increased in size compared to 12/2014 - pericardium also thickened.   . Pericarditis    a. Dx 05/2015.  [name] Pneumothorax, spontaneous, tension 01/20/2012   October, 2010  . Pulmonary fibrosis ([place]) 01/20/2012   Due to previous surgery and chest radiation for lung cancer  . Restrictive lung disease   . Type II diabetes mellitus ([place])     Past Surgical History:  Procedure Laterality Date  . ABDOMINAL HYSTERECTOMY  2008  . APPENDECTOMY  1969?  [name] CARDIAC CATHETERIZATION N/A 07/11/2015   Procedure: Left Heart Cath and Coronary Angiography;  Surgeon: Jettie Booze, MD;  Location: [facility];  Service: Cardiovascular;  Laterality: N/A;  . CARDIAC CATHETERIZATION  07/11/2015   Procedure: Coronary Stent Intervention;  Surgeon: Jettie Booze, MD;  Location: [facility];  Service: Cardiovascular;;  . CARPAL TUNNEL RELEASE Bilateral 1998-2005?   right-left  . CARPAL TUNNEL RELEASE Left 2009?   "for the 2nd time"  . COLONOSCOPY    . CORONARY ANGIOPLASTY    . CYSTOSCOPY W/ STONE MANIPULATION  2013  . ESOPHAGOGASTRODUODENOSCOPY (  EGD) WITH ESOPHAGEAL DILATION  2012  . FOOT SURGERY Bilateral 1980's   Callous removed   . INNER EAR SURGERY Right ~ 2009  . KNEE ARTHROPLASTY Right 1991  . KNEE ARTHROSCOPY  ~ 2000   LATERAL RELEASE  . LEFT HEART CATH AND CORONARY ANGIOGRAPHY N/A 12/18/2016   Procedure: Left Heart Cath and Coronary Angiography;  Surgeon: Burnell Blanks, MD;  Location: [facility];  Service: Cardiovascular;  Laterality: N/A;  . LUNG CANCER SURGERY Left 2001   "small cell"  . LUNG SURGERY Right 2007   "reinflated it"  . PULMONARY EMBOLISM SURGERY  2007   LUNG COLLAPSE  . SHOULDER ADHESION RELEASE Right ~ 2004  . SHOULDER  ARTHROSCOPY W/ ROTATOR CUFF REPAIR Right ~ 2003     Allergies:  Allergies  Allergen Reactions  . Hydrocodone Hives and Itching  . Morphine And Related Other (See Comments)    GI PROBLEMS  . Lisinopril Other (See Comments) and Cough    Cough   . Nsaids Other (See Comments)    GI PROBLEMS     Home Medications Current Facility-Administered Medications  Medication Dose Route Frequency Provider Last Rate Last Dose  . nitroGLYCERIN (NITROSTAT) SL tablet 0.4 mg  0.4 mg Sublingual Q5 min PRN Sherwood Gambler, MD   0.4 mg at 04/12/17 0335   Current Outpatient Prescriptions  Medication Sig Dispense Refill  . apixaban (ELIQUIS) 5 MG TABS tablet Take 1 tablet (5 mg total) by mouth 2 (two) times daily. 180 tablet 1  . aspirin EC 81 MG EC tablet Take 1 tablet (81 mg total) by mouth daily. 30 tablet 5  . B-D UF III MINI PEN NEEDLES 31G X 5 MM MISC 2 (two) times daily. as directed  11  . colchicine 0.6 MG tablet TAKE 1 TABLET BY MOUTH 2 (TWO) TIMES DAILY. 180 tablet 0  . diltiazem (CARDIZEM CD) 240 MG 24 hr capsule Take 1 capsule (240 mg total) by mouth daily. 90 capsule 3  . furosemide (LASIX) 20 MG tablet Take 1 tablet (20 mg total) by mouth daily. 90 tablet 3  . HUMALOG MIX 75/25 KWIKPEN (75-25) 100 UNIT/ML Kwikpen Inject 25-35 Units into the skin See admin instructions. Inject 35 units subque in Am and 25 units subque in PM  6  . hydrochlorothiazide (MICROZIDE) 12.5 MG capsule Take 12.5 mg by mouth daily.    [name] losartan (COZAAR) 25 MG tablet Take 1 tablet (25 mg total) by mouth daily. 90 tablet 3  . meloxicam (MOBIC) 15 MG tablet Take 1 tablet by mouth daily with breakfast.  1  . metoprolol tartrate (LOPRESSOR) 25 MG tablet Take 1 tablet (25 mg total) by mouth 2 (two) times daily. 180 tablet 2  . ONE TOUCH ULTRA TEST test strip     . rosuvastatin (CRESTOR) 5 MG tablet Take 1 tablet (5 mg total) by mouth daily. 90 tablet 3     Family History  Problem Relation Age of Onset  . Heart disease  Father   . Diabetes Father   . Pancreatic cancer Mother   . Diabetes Mother   . Hypertension Sister        x 3  . Diabetes Sister        x 4  . Diabetes Maternal Grandmother   . Diabetes Paternal Grandmother   . Colon cancer Neg Hx   . Colon polyps Neg Hx   . Rectal cancer Neg Hx   . Stomach cancer Neg Hx   .  Esophageal cancer Neg Hx      Social History   Social History  . Marital status: Single    Spouse name: N/A  . Number of children: 0  . Years of education: N/A   Occupational History  . RETIRED    Social History Main Topics  . Smoking status: Former Smoker    Packs/day: 0.25    Years: 5.00    Types: Cigarettes    Quit date: 07/22/1999  . Smokeless tobacco: Never Used  . Alcohol use No  . Drug use: No  . Sexual activity: Not Currently    Birth control/ protection: Other-see comments     Comment: hysterectomy   Other Topics Concern  . Not on file   Social History Narrative  . No narrative on file     Review of Systems: General: negative for chills, fever, night sweats or weight changes.  Cardiovascular: chest pain, dyspnea negative for Dermatological: negative for rash Respiratory: cough negative for   wheezing Urologic: negative for hematuria Abdominal: negative for nausea, vomiting, diarrhea, bright red blood per rectum, melena, or hematemesis Neurologic: negative for visual changes, syncope, or dizziness Endocrine: no diabetes, no hypothyroidism Immunological: no lymph adenopathy Psych: non homicidal/suicidal  Physical Exam: Vitals: BP 130/65   Pulse 61   Temp 98 F (36.7 C) (Oral)   Resp (!) 21   SpO2 98%  General: not in acute distress Neck: JVP flat, neck supple Heart: regular rate and rhythm, S1, S2, no murmurs  pericardial rub on exam Lungs: CTAB  GI: non tender, non distended, bowel sounds present Extremities: no edema Neuro: AAO x 3  Psych: normal affect, no anxiety   Labs:   Results for orders placed or performed during the  hospital encounter of 04/12/17 (from the past 24 hour(s))  Basic metabolic panel     Status: Abnormal   Collection Time: 04/12/17  2:35 AM  Result Value Ref Range   Sodium 139 135 - 145 mmol/L   Potassium 4.1 3.5 - 5.1 mmol/L   Chloride 104 101 - 111 mmol/L   CO2 28 22 - 32 mmol/L   Glucose, Bld 115 (H) 65 - 99 mg/dL   BUN 24 (H) 6 - 20 mg/dL   Creatinine, Ser 0.95 0.44 - 1.00 mg/dL   Calcium 9.5 8.9 - 10.3 mg/dL   GFR calc non Af Amer >60 >60 mL/min   GFR calc Af Amer >60 >60 mL/min   Anion gap 7 5 - 15  CBC     Status: None   Collection Time: 04/12/17  2:35 AM  Result Value Ref Range   WBC 7.5 4.0 - 10.5 K/uL   RBC 4.39 3.87 - 5.11 MIL/uL   Hemoglobin 13.4 12.0 - 15.0 g/dL   HCT 41.5 36.0 - 46.0 %   MCV 94.5 78.0 - 100.0 fL   MCH 30.5 26.0 - 34.0 pg   MCHC 32.3 30.0 - 36.0 g/dL   RDW 12.4 11.5 - 15.5 %   Platelets 255 150 - 400 K/uL  Troponin I     Status: None   Collection Time: 04/12/17  2:35 AM  Result Value Ref Range   Troponin I <0.03 <0.03 ng/mL     Radiology/Studies: Dg Chest 2 View  Result Date: 04/12/2017 CLINICAL DATA:  Chest pain. EXAM: CHEST  2 VIEW COMPARISON:  Chest CT 3 weeks prior 03/20/2017. Radiographs 10/04/2016 FINDINGS: Chronic volume loss in the left lung with surgical clips in left apical pleuroparenchymal scarring. Chain sutures in  the right upper lung. Stable cardiomegaly and mediastinal contours, a coronary stent is seen. No pulmonary edema, pleural effusion, pneumothorax or focal airspace disease. Chronic widening of the right acromioclavicular joint. IMPRESSION: No acute abnormality.  Stable cardiomegaly and postsurgical change. Electronically Signed   By: Jeb Levering M.D.   On: 04/12/2017 02:50   Ct Chest W Contrast  Result Date: 03/21/2017 CLINICAL DATA:  Hoarseness x 2-3 months, history of small cell lung cancer EXAM: CT CHEST WITH CONTRAST TECHNIQUE: Multidetector CT imaging of the chest was performed during intravenous contrast  administration. CONTRAST:  61mL ISOVUE-300 IOPAMIDOL (ISOVUE-300) INJECTION 61% COMPARISON:  Chest radiographs dated 10/04/2016. CTA chest dated 05/30/2015. FINDINGS: Cardiovascular: Heart is top-normal in size. Small pericardial effusion. Coronary atherosclerosis in the LAD and left circumflex with coronary stent in the LAD. No evidence of thoracic aortic aneurysm. Mild atherosclerotic calcifications. Mediastinum/Nodes: No suspicious mediastinal lymphadenopathy. Visualized thyroid is unremarkable. Lungs/Pleura: Surgical clips with radiation changes in the posterior left lung apex. Associated volume loss. This appearance is unchanged from 2016. Additional postsurgical changes in the right lung related to prior lobectomy versus upper lung wedge resection. Mild subpleural scarring anteriorly in the left upper lobe/ lingula. Mild linear scarring in the left lower lobe. No focal consolidation. No suspicious pulmonary nodules. No pleural effusion or pneumothorax. Upper Abdomen: Visualize upper abdomen is unremarkable. Musculoskeletal: Degenerative changes of the visualized thoracolumbar spine. IMPRESSION: Postsurgical changes in the lungs bilaterally. Radiation changes in the left lung apex. No evidence of recurrent or metastatic disease. Electronically Signed   By: Julian Hy M.D.   On: 03/21/2017 08:47    EKG: atrial fib with rvr  Echo: 08/2016 Left ventricle: The cavity size was normal. Wall thickness was   normal. Systolic function was normal. The estimated ejection   fraction was in the range of 55% to 60%. Wall motion was normal;   there were no regional wall motion abnormalities. Doppler   parameters are consistent with abnormal left ventricular   relaxation (grade 1 diastolic dysfunction). - Aortic valve: There was no stenosis. There was mild to moderate   regurgitation. - Mitral valve: There was trivial regurgitation. - Right ventricle: The cavity size was normal. Systolic function   was  normal. - Right atrium: The atrium was mildly dilated. - Pulmonic valve: Turbulence across the pulmonic valve but peak   gradient 15 mmHg does not suggest significant stenosis. There was   mild regurgitation. - Pulmonary arteries: PA peak pressure: 28 mm Hg (S). - Inferior vena cava: The vessel was normal in size. The   respirophasic diameter changes were in the normal range (>= 50%),   consistent with normal central venous pressure. - Pericardium, extracardiac: Small pericardial effusion, no   tamponade.  Cardiac cath: 12/18/2016  1st Mrg lesion, 30 %stenosed.  Ost 2nd Mrg to 2nd Mrg lesion, 20 %stenosed.  Mid Cx lesion, 0 %stenosed.  Ost LAD to Mid LAD lesion, 0 %stenosed.  Ost 1st Diag to 1st Diag lesion, 70 %stenosed.  1st Diag lesion, 99 %stenosed.  The left ventricular systolic function is normal.  LV end diastolic pressure is normal.  The left ventricular ejection fraction is greater than 65% by visual estimate.  There is no mitral valve regurgitation.   1. Double vessel CAD, stable.  2. Patent stent in the proximal to mid LAD with no restenosis 3. Patent stent in the mid Circumflex with no restenosis 4. The Diagonal branch is a small caliber vessel that is jailed by the LAD stent.  There is severe disease in this small branch but PCI is not a good option 5. Small non-dominant RCA 6. Normal LV systolic function  Medical decision making:  Discussed care with the patient Discussed care with the physician on the phone Reviewed labs and imaging personally Reviewed prior records  ASSESSMENT AND PLAN:  This is a 61 y.o. female with known history of recurrent pericarditis and paroxysmal atrial fibrillation presented with another episode of pericarditis  Acute on chronic recurrent pericarditis, pericardial rub on exam, afib w/rvr causes it to get worse - checking CRP, repeat echo, indocin, colchicine   Atrial fibrillation, paroxysmal  chasds2vasc score 4 (htn,  female, cad, dm) - rate control with bb, diltiazem, reverted to sinus rhythm in the ED, will admit for observation, repeat echo, her dilt was increased to 240 mg 2 weeks ago. Will keep same medicines, there is room on bb if needed  htn - reasonably controlled, compliant with meds, goal <130  Dm - on ssi, insulin hba1c ordered       Signed, Flossie Dibble, MD MS 04/12/2017, 6:36 AM   Attending Addendum:  History and all data above reviewed.  Patient examined.  I agree with the findings as above.  Ms. Zanetti is a 34F with CAD s/p PCI of the LAD and LCx with residual medially managed disease, hypertension, diabetes, recurrent pericarditis, chronic pericardial effusion, COPD and prior small cell lung cancer here with chest pain and atrial fibrillation with RVR.  All available labs, radiology testing, previous records reviewed. Agree with documented assessment and plan. She spontaneously converted back to sinus rhythm, but her chest pain persists.  This is likely recurrent pericarditis.  She hasn't tolerated ibuprofen.  Resume meloxicam and colchicine.  Echo pending.  No evidence of tamponade by symptoms and vitals.  Will rule out for MI.  Cardiac MRI pending for evaluation of constriction.  Will add pantoprazole given that she is on meloxicam, aspirin, and Eliquis.    Tiffany C. Oval Linsey, MD, [facility]  04/12/2017 12:27 PM

## 2017-04-13 ENCOUNTER — Observation Stay (HOSPITAL_BASED_OUTPATIENT_CLINIC_OR_DEPARTMENT_OTHER): Payer: Medicare Other

## 2017-04-13 DIAGNOSIS — I309 Acute pericarditis, unspecified: Secondary | ICD-10-CM

## 2017-04-13 DIAGNOSIS — I360 Nonrheumatic tricuspid (valve) stenosis: Secondary | ICD-10-CM | POA: Diagnosis not present

## 2017-04-13 LAB — COMPREHENSIVE METABOLIC PANEL
ALT: 22 U/L (ref 14–54)
AST: 24 U/L (ref 15–41)
Albumin: 3.4 g/dL — ABNORMAL LOW (ref 3.5–5.0)
Alkaline Phosphatase: 99 U/L (ref 38–126)
Anion gap: 7 (ref 5–15)
BUN: 28 mg/dL — ABNORMAL HIGH (ref 6–20)
CO2: 28 mmol/L (ref 22–32)
Calcium: 9.2 mg/dL (ref 8.9–10.3)
Chloride: 106 mmol/L (ref 101–111)
Creatinine, Ser: 1.01 mg/dL — ABNORMAL HIGH (ref 0.44–1.00)
GFR calc Af Amer: >60 mL/min (ref >60)
GFR calc non Af Amer: >60 mL/min (ref >60)
Glucose, Bld: 127 mg/dL — ABNORMAL HIGH (ref 65–99)
Potassium: 3.7 mmol/L (ref 3.5–5.1)
Sodium: 141 mmol/L (ref 135–145)
Total Bilirubin: 0.5 mg/dL (ref 0.3–1.2)
Total Protein: 6.2 g/dL — ABNORMAL LOW (ref 6.5–8.1)

## 2017-04-13 LAB — CBC
HCT: 35 % — ABNORMAL LOW (ref 36.0–46.0)
Hemoglobin: 11.3 g/dL — ABNORMAL LOW (ref 12.0–15.0)
MCH: 30.3 pg (ref 26.0–34.0)
MCHC: 32.3 g/dL (ref 30.0–36.0)
MCV: 93.8 fL (ref 78.0–100.0)
Platelets: 226 10*3/uL (ref 150–400)
RBC: 3.73 MIL/uL — ABNORMAL LOW (ref 3.87–5.11)
RDW: 12.2 % (ref 11.5–15.5)
WBC: 6 10*3/uL (ref 4.0–10.5)

## 2017-04-13 LAB — PROTIME-INR
INR: 1.06
Prothrombin Time: 13.8 seconds (ref 11.4–15.2)

## 2017-04-13 LAB — ECHOCARDIOGRAM COMPLETE
Height: 67.5 in
Weight: 4062.4 oz

## 2017-04-13 LAB — GLUCOSE, CAPILLARY
Glucose-Capillary: 113 mg/dL — ABNORMAL HIGH (ref 65–99)
Glucose-Capillary: 154 mg/dL — ABNORMAL HIGH (ref 65–99)
Glucose-Capillary: 82 mg/dL (ref 65–99)
Glucose-Capillary: 141 mg/dL — ABNORMAL HIGH (ref 65–99)

## 2017-04-13 LAB — TROPONIN I: Troponin I: <0.03 ng/mL (ref <0.03)

## 2017-04-13 LAB — HEMOGLOBIN A1C
Hgb A1c MFr Bld: 7.5 % — ABNORMAL HIGH (ref 4.8–5.6)
Mean Plasma Glucose: 169 mg/dL

## 2017-04-13 LAB — HIV ANTIBODY (ROUTINE TESTING W REFLEX): HIV Screen 4th Generation wRfx: NONREACTIVE

## 2017-04-13 NOTE — Care Management Obs Status (Signed)
[place] NOTIFICATION   Patient Details  Name: [name] MRN: [id number] Date of Birth: [birth year]   Medicare Observation Status Notification Given:  Yes    Maryclare Labrador, RN 04/13/2017, 12:44 PM

## 2017-04-13 NOTE — Progress Notes (Signed)
Patient c/o midsternal chest pain 4/10 not relieved by nitro. She is currently in bed with eyes closed.  Will continue to monitor.

## 2017-04-13 NOTE — Progress Notes (Signed)
Progress Note  Patient Name: [name] Date of Encounter: 04/13/2017  Primary Cardiologist: [name]  Subjective   Still with positional chest pain   Inpatient Medications    Scheduled Meds: . apixaban  5 mg Oral BID  . aspirin EC  81 mg Oral Daily  . colchicine  0.6 mg Oral BID  . diltiazem  240 mg Oral Daily  . furosemide  20 mg Oral Daily  . hydrochlorothiazide  12.5 mg Oral Daily  . indomethacin  50 mg Oral BID WC  . insulin aspart  0-15 Units Subcutaneous TID WC  . insulin aspart  0-5 Units Subcutaneous QHS  . insulin aspart protamine- aspart  25 Units Subcutaneous Q supper  . insulin aspart protamine- aspart  35 Units Subcutaneous Q breakfast  . losartan  25 mg Oral Daily  . metoprolol tartrate  25 mg Oral BID  . pantoprazole  40 mg Oral Daily  . rosuvastatin  5 mg Oral Daily  . sodium chloride flush  3 mL Intravenous Q12H   Continuous Infusions:  PRN Meds: acetaminophen   Vital Signs    Vitals:   04/13/17 0014 04/13/17 0448 04/13/17 0756 04/13/17 0945  BP: (!) 133/59 (!) 137/58 133/61 123/61  Pulse: 66 61 66 67  Resp: 19 20 (!) 22   Temp: 98.7 F (37.1 C) 98.7 F (37.1 C) 98.9 F (37.2 C)   TempSrc:   Oral   SpO2: 97% 100% 100%   Weight:  115.2 kg (253 lb 14.4 oz)    Height:        Intake/Output Summary (Last 24 hours) at 04/13/17 0953 Last data filed at 04/13/17 0449  Gross per 24 hour  Intake              303 ml  Output                0 ml  Net              303 ml   Filed Weights   04/12/17 1419 04/13/17 0448  Weight: 116.3 kg (256 lb 8 oz) 115.2 kg (253 lb 14.4 oz)    Telemetry    NSR 04/13/2017  - Personally Reviewed  ECG    NSR normal no pericardial signs   - Personally Reviewed  Physical Exam  Obese black female  GEN: No acute distress.   Neck: No JVD Cardiac: RRR friction rub apparent  Respiratory: Clear to auscultation bilaterally. GI: Soft, nontender, non-distended  MS: No edema; No deformity. Neuro:  Nonfocal    Psych: Normal affect   Labs    Chemistry Recent Labs Lab 04/12/17 0235 04/13/17 0228  NA 139 141  K 4.1 3.7  CL 104 106  CO2 28 28  GLUCOSE 115* 127*  BUN 24* 28*  CREATININE 0.95 1.01*  CALCIUM 9.5 9.2  PROT  --  6.2*  ALBUMIN  --  3.4*  AST  --  24  ALT  --  22  ALKPHOS  --  99  BILITOT  --  0.5  GFRNONAA >60 >60  GFRAA >60 >60  ANIONGAP 7 7     Hematology Recent Labs Lab 04/12/17 0235 04/13/17 0228  WBC 7.5 6.0  RBC 4.39 3.73*  HGB 13.4 11.3*  HCT 41.5 35.0*  MCV 94.5 93.8  MCH 30.5 30.3  MCHC 32.3 32.3  RDW 12.4 12.2  PLT 255 226    Cardiac Enzymes Recent Labs Lab 04/12/17 0235 04/12/17 1620 04/12/17 2010 04/13/17  0228  TROPONINI <0.03 <0.03 <0.03 <0.03   No results for input(s): TROPIPOC in the last 168 hours.   BNP Recent Labs Lab 04/12/17 1620  BNP 59.8     DDimer No results for input(s): DDIMER in the last 168 hours.   Radiology    Dg Chest 2 View  Result Date: 04/12/2017 CLINICAL DATA:  Chest pain. EXAM: CHEST  2 VIEW COMPARISON:  Chest CT 3 weeks prior 03/20/2017. Radiographs 10/04/2016 FINDINGS: Chronic volume loss in the left lung with surgical clips in left apical pleuroparenchymal scarring. Chain sutures in the right upper lung. Stable cardiomegaly and mediastinal contours, a coronary stent is seen. No pulmonary edema, pleural effusion, pneumothorax or focal airspace disease. Chronic widening of the right acromioclavicular joint. IMPRESSION: No acute abnormality.  Stable cardiomegaly and postsurgical change. Electronically Signed   By: Jeb Levering M.D.   On: 04/12/2017 02:50    Cardiac Studies  CAth 12/18/16  Conclusion     1st Mrg lesion, 30 %stenosed.  Ost 2nd Mrg to 2nd Mrg lesion, 20 %stenosed.  Mid Cx lesion, 0 %stenosed.  Ost LAD to Mid LAD lesion, 0 %stenosed.  Ost 1st Diag to 1st Diag lesion, 70 %stenosed.  1st Diag lesion, 99 %stenosed.  The left ventricular systolic function is normal.  LV end  diastolic pressure is normal.  The left ventricular ejection fraction is greater than 65% by visual estimate.  There is no mitral valve regurgitation.   1. Double vessel CAD, stable.  2. Patent stent in the proximal to mid LAD with no restenosis 3. Patent stent in the mid Circumflex with no restenosis 4. The Diagonal branch is a small caliber vessel that is jailed by the LAD stent. There is severe disease in this small branch but PCI is not a good option 5. Small non-dominant RCA    Echo 09/18/16 Study Conclusions  - Left ventricle: The cavity size was normal. Wall thickness was   normal. Systolic function was normal. The estimated ejection   fraction was in the range of 55% to 60%. Wall motion was normal;   there were no regional wall motion abnormalities. Doppler   parameters are consistent with abnormal left ventricular   relaxation (grade 1 diastolic dysfunction). - Aortic valve: There was no stenosis. There was mild to moderate   regurgitation. - Mitral valve: There was trivial regurgitation. - Right ventricle: The cavity size was normal. Systolic function   was normal. - Right atrium: The atrium was mildly dilated. - Pulmonic valve: Turbulence across the pulmonic valve but peak   gradient 15 mmHg does not suggest significant stenosis. There was   mild regurgitation. - Pulmonary arteries: PA peak pressure: 28 mm Hg (S). - Inferior vena cava: The vessel was normal in size. The   respirophasic diameter changes were in the normal range (>= 50%),   consistent with normal central venous pressure. - Pericardium, extracardiac: Small pericardial effusion, no   tamponade.  Impressions:  - Normal LV size with EF 55-60%. Normal RV size and systolic   function. Mild to moderate aortic insufficiency. Turbulence   across the pulmonic valve but no significant stenosis by doppler   measurement.  Patient Profile     61 y.o. female with recurrent pericarditis  Assessment & Plan     1) Pericarditis: failed meloxicam and colchicine she indicates being on them for a year. Repeat echo pending Dr Oval Linsey ordered MRI for constriction but this won't be as helpful as it is a breath hold  tecnique She has been on steroids in past and I suspect she will need a more prolonged course to get rid of inflammation NSAI"s listed as allergy Pericardium looks ok on CT of chest   Signed, Jenkins Rouge, MD  04/13/2017, 9:53 AM

## 2017-04-14 ENCOUNTER — Observation Stay (HOSPITAL_COMMUNITY): Payer: Medicare Other

## 2017-04-14 DIAGNOSIS — I300 Acute nonspecific idiopathic pericarditis: Secondary | ICD-10-CM | POA: Diagnosis not present

## 2017-04-14 DIAGNOSIS — I309 Acute pericarditis, unspecified: Secondary | ICD-10-CM | POA: Diagnosis not present

## 2017-04-14 LAB — GLUCOSE, CAPILLARY
Glucose-Capillary: 71 mg/dL (ref 65–99)
Glucose-Capillary: 121 mg/dL — ABNORMAL HIGH (ref 65–99)
Glucose-Capillary: 304 mg/dL — ABNORMAL HIGH (ref 65–99)

## 2017-04-14 LAB — SEDIMENTATION RATE: Sed Rate: 12 mm/hr (ref 0–22)

## 2017-04-14 MED ORDER — GADOBENATE DIMEGLUMINE 529 MG/ML IV SOLN
40.0000 mL | Freq: Once | INTRAVENOUS | Status: AC
  Administered 2017-04-14: 38 mL via INTRAVENOUS

## 2017-04-14 MED ORDER — PREDNISONE 20 MG PO TABS
40.0000 mg | ORAL_TABLET | Freq: Every day | ORAL | Status: DC
Start: 2017-04-14 — End: 2017-04-14
  Administered 2017-04-14: 40 mg via ORAL
  Filled 2017-04-14: qty 2

## 2017-04-14 MED ORDER — PREDNISONE 20 MG PO TABS: 40.0000 mg | ORAL_TABLET | Freq: Every day | ORAL | 0 refills | Status: DC

## 2017-04-14 NOTE — Plan of Care (Signed)
Problem: Pain Managment: Goal: General experience of comfort will improve Outcome: Not Progressing No complain of pain. Encouraged to notify the nurse when in pain.

## 2017-04-14 NOTE — Discharge Summary (Signed)
Discharge Summary    Patient ID: [name],  MRN: [id number], DOB/AGE: [birth year] 59 y.o.  Admit date: 04/12/2017 Discharge date: 04/14/2017  Primary Care Provider: [name] Primary Cardiologist: [name]   Discharge Diagnoses    Active Problems:   Pericarditis   Unstable angina [facility])   Hypertension, essential   Atrial fibrillation with RVR (HCC)   Allergies Allergies  Allergen Reactions  . Hydrocodone Hives and Itching  . Morphine And Related Other (See Comments)    GI PROBLEMS  . Lisinopril Other (See Comments) and Cough    Cough   . Nsaids Other (See Comments)    GI PROBLEMS    Diagnostic Studies/Procedures    Cardiac MRI  TTE: 04/13/17  Study Conclusions  - Left ventricle: The cavity size was normal. Wall thickness was   normal. Systolic function was normal. The estimated ejection   fraction was in the range of 55% to 60%. - Aortic valve: There was moderate regurgitation. Valve area (VTI):   1.99 cm^2. Valve area (Vmax): 2.04 cm^2. Valve area (Vmean): 1.95   cm^2. - Atrial septum: No defect or patent foramen ovale was identified. _____________   History of Present Illness     61 y.o. female with history of CAD s/p DES to [facility] and mLAD 07/11/15, chronic pericardial effusion, DM, HTN, remote h/o treated non-small cell lung cancer, pulmonary fibrosis, aortic insufficiency, COPD, anemia and mod AI by echo 05/2015. Patient was recently seen as outpatient by Dr. [name] and at that time, her cardiazem was increased to 240 mg daily. Patient had an episode of acute pericarditis in 11/2016.  The CP she described as chest pressure usually occurs with SOB and at rest associated with irregular palpitations. Symptoms get worse with deep breathing. These symptoms started when she was vacuuming the house so she had to stop. When she presented, she was in afib with RVR which improved with pain control and rate control in the ED. Patient also has an MRI ordered which  was due on Friday. She was also planned to be seen by the EP physician for afib. Pt is being admitted for observation for ACS r/o. She had been having a dry cough since last 3 days.   Hospital Course     AF with RVR: converted spontaneously to sinus rhythm. Anticoagulated with Eliquis 38m BID. Continue diltiazem 240 mg daily. ASA 823mstopped this admission.  2. Pericarditis, recurrent: ESR 12. Trop neg x3. Echocardiogram reviewed demonstrating no pericardial effusion. The patient has symptoms of recurrent pericarditis refractory to a combination of colchicine and nonsteroidal anti-inflammatories. While steroids are not ideal considering some data suggesting increase risk of long-term complications, this patient has refractory symptoms despite optimal treatment. It was felt the best option was a slow steroid taper.  -- started on prednisone 40 mg daily 2 weeks, then slowly taper over 6 weeks. Cardiac MRI showed no constriction. Continued on colchicine.   3. Type 2 diabetes: continue on insulin. Blood sugars are reviewed and are well controlled.  4. Hypertension: Blood pressure well controlled on current medicines. No changes made.  She was seen by Dr. CoBurt Knacknd determined stable for discharge home. Follow up in the office has been arranged. Medications are listed below.  _____________  Discharge Vitals Blood pressure 128/66, pulse 62, temperature 97.9 F (36.6 C), temperature source Oral, resp. rate 18, height 5' 7.5" (1.715 m), weight 252 lb (114.3 kg), SpO2 97 %.  Filed Weights   04/12/17 1419 04/13/17  0448 04/14/17 0433  Weight: 256 lb 8 oz (116.3 kg) 253 lb 14.4 oz (115.2 kg) 252 lb (114.3 kg)    Labs & Radiologic Studies    CBC  Recent Labs  04/12/17 0235 04/13/17 0228  WBC 7.5 6.0  HGB 13.4 11.3*  HCT 41.5 35.0*  MCV 94.5 93.8  PLT 255 563   Basic Metabolic Panel  Recent Labs  04/12/17 0235 04/13/17 0228  NA 139 141  K 4.1 3.7  CL 104 106  CO2 28 28    GLUCOSE 115* 127*  BUN 24* 28*  CREATININE 0.95 1.01*  CALCIUM 9.5 9.2   Liver Function Tests  Recent Labs  04/13/17 0228  AST 24  ALT 22  ALKPHOS 99  BILITOT 0.5  PROT 6.2*  ALBUMIN 3.4*   No results for input(s): LIPASE, AMYLASE in the last 72 hours. Cardiac Enzymes  Recent Labs  04/12/17 1620 04/12/17 2010 04/13/17 0228  TROPONINI <0.03 <0.03 <0.03   BNP Invalid input(s): POCBNP D-Dimer No results for input(s): DDIMER in the last 72 hours. Hemoglobin A1C  Recent Labs  04/12/17 1620  HGBA1C 7.5*   Fasting Lipid Panel No results for input(s): CHOL, HDL, LDLCALC, TRIG, CHOLHDL, LDLDIRECT in the last 72 hours. Thyroid Function Tests  Recent Labs  04/12/17 1620  TSH 2.776   _____________  Dg Chest 2 View  Result Date: 04/12/2017 CLINICAL DATA:  Chest pain. EXAM: CHEST  2 VIEW COMPARISON:  Chest CT 3 weeks prior 03/20/2017. Radiographs 10/04/2016 FINDINGS: Chronic volume loss in the left lung with surgical clips in left apical pleuroparenchymal scarring. Chain sutures in the right upper lung. Stable cardiomegaly and mediastinal contours, a coronary stent is seen. No pulmonary edema, pleural effusion, pneumothorax or focal airspace disease. Chronic widening of the right acromioclavicular joint. IMPRESSION: No acute abnormality.  Stable cardiomegaly and postsurgical change. Electronically Signed   By: Jeb Levering M.D.   On: 04/12/2017 02:50   Ct Chest W Contrast  Result Date: 03/21/2017 CLINICAL DATA:  Hoarseness x 2-3 months, history of small cell lung cancer EXAM: CT CHEST WITH CONTRAST TECHNIQUE: Multidetector CT imaging of the chest was performed during intravenous contrast administration. CONTRAST:  55m ISOVUE-300 IOPAMIDOL (ISOVUE-300) INJECTION 61% COMPARISON:  Chest radiographs dated 10/04/2016. CTA chest dated 05/30/2015. FINDINGS: Cardiovascular: Heart is top-normal in size. Small pericardial effusion. Coronary atherosclerosis in the LAD and left  circumflex with coronary stent in the LAD. No evidence of thoracic aortic aneurysm. Mild atherosclerotic calcifications. Mediastinum/Nodes: No suspicious mediastinal lymphadenopathy. Visualized thyroid is unremarkable. Lungs/Pleura: Surgical clips with radiation changes in the posterior left lung apex. Associated volume loss. This appearance is unchanged from 2016. Additional postsurgical changes in the right lung related to prior lobectomy versus upper lung wedge resection. Mild subpleural scarring anteriorly in the left upper lobe/ lingula. Mild linear scarring in the left lower lobe. No focal consolidation. No suspicious pulmonary nodules. No pleural effusion or pneumothorax. Upper Abdomen: Visualize upper abdomen is unremarkable. Musculoskeletal: Degenerative changes of the visualized thoracolumbar spine. IMPRESSION: Postsurgical changes in the lungs bilaterally. Radiation changes in the left lung apex. No evidence of recurrent or metastatic disease. Electronically Signed   By: SJulian HyM.D.   On: 03/21/2017 08:47   Mr Cardiac Morphology W Wo Contrast  Result Date: 04/14/2017 CLINICAL DATA:  Pericarditis EXAM: CARDIAC MRI TECHNIQUE: The patient was scanned on a 1.5 Tesla GE magnet. A dedicated cardiac coil was used. Functional imaging was done using Fiesta sequences. 2,3, and 4 chamber views were  done to assess for RWMA's. Modified Simpson's rule using a short axis stack was used to calculate an ejection fraction on a dedicated work Conservation officer, nature. The patient received Multihance. After 10 minutes inversion recovery sequences were used to assess for infiltration and scar tissue. CONTRAST:  Multihance FINDINGS: There was mild LAE. The RA and RV were normal. There was no abnormal septal bounce or evidence of ventricular inter-dependence although free breathing sequences were not performed The aortic valve was tri leaflet with some degree of AR that was not well characterized on this  study. Mitral and tricuspid valves normal. The patient had a large amount of both visceral and parietal epicardial adipose tissue. There was a moderate pericardial effusion posterior to the LV and lateral to the RV free wall This was not appreciated on her recent echo due to poor acoustic windows and lateral drop out. The pericardium itself was thin and there were no signs of diastolic RV collapse. Post gadolinium images showed mild pericardial uptake over the RV free wall and apex on 3 chamber views. There was no evidence of myocarditis or LV myocardial uptake. IMPRESSION: 1) Moderate pericardial effusion lateral to the RV free wall and posterior LV myocardium. Note this is not appreciated on recent echo 2) Normal pericardial thickness with mild gadolinium uptake over the RV free wall and apex on 3 chamber view 3) No evidence of constriction although MRI especially with no free breathing sequences not ideal to assess Pericardium normal thickness and no ventricular inter dependence 4) Normal LV EF 59% (EDV 105 ESV 43 SV 62 cc) 5) No evidence of myocarditis or LV myocardial uptake of gadolinium on delayed inversion recovery sequences 6) Tri leaflet aortic valve with AR 7) Mild LAE Images were also reviewed by Dr Loralie Champagne who agreed with above findings [place] Electronically Signed   By: Jenkins Rouge M.D.   On: 04/14/2017 15:12   Disposition   Pt is being discharged home today in good condition.  Follow-up Plans & Appointments    Follow-up Information    Imogene Burn, PA-C Follow up on 05/06/2017.   Specialty:  Cardiology Why:  at 11:15am for your follow up appt.  Contact information: [address] [place] [address] [phone]          Discharge Instructions    Diet - low sodium heart healthy    Complete by:  As directed    Discharge instructions    Complete by:  As directed    We have added prednisone 95m daily this admission. You will need to take 448m daily for the next 2 weeks, and then decrease to 2054maily until seen at your follow up appt.   Increase activity slowly    Complete by:  As directed       Discharge Medications   Current Discharge Medication List    START taking these medications   Details  predniSONE (DELTASONE) 20 MG tablet Take 2 tablets (40 mg total) by mouth daily with breakfast. For 2 weeks, then 69m25mily Qty: 60 tablet, Refills: 0      CONTINUE these medications which have NOT CHANGED   Details  apixaban (ELIQUIS) 5 MG TABS tablet Take 1 tablet (5 mg total) by mouth 2 (two) times daily. Qty: 180 tablet, Refills: 1    colchicine 0.6 MG tablet TAKE 1 TABLET BY MOUTH 2 (TWO) TIMES DAILY. Qty: 180 tablet, Refills: 0    diltiazem (CARDIZEM CD) 240  MG 24 hr capsule Take 1 capsule (240 mg total) by mouth daily. Qty: 90 capsule, Refills: 3   Associated Diagnoses: Other acute pericarditis    furosemide (LASIX) 20 MG tablet Take 1 tablet (20 mg total) by mouth daily. Qty: 90 tablet, Refills: 3   Associated Diagnoses: Cough; Paroxysmal A-fib ([place]); Mixed hyperlipidemia; Essential hypertension; Coronary artery disease involving native coronary artery of native heart without angina pectoris    HUMALOG MIX 75/25 KWIKPEN (75-25) 100 UNIT/ML Kwikpen Inject 25-35 Units into the skin 2 (two) times daily. Inject 35 units subque in Am and 25 units subque in PM Refills: 6    hydrochlorothiazide (MICROZIDE) 12.5 MG capsule Take 12.5 mg by mouth daily.    losartan (COZAAR) 25 MG tablet Take 1 tablet (25 mg total) by mouth daily. Qty: 90 tablet, Refills: 3    meloxicam (MOBIC) 15 MG tablet Take 1 tablet by mouth daily with breakfast. Refills: 1    metoprolol tartrate (LOPRESSOR) 25 MG tablet Take 1 tablet (25 mg total) by mouth 2 (two) times daily. Qty: 180 tablet, Refills: 2    rosuvastatin (CRESTOR) 5 MG tablet Take 1 tablet (5 mg total) by mouth daily. Qty: 90 tablet, Refills: 3      STOP taking these  medications     aspirin EC 81 MG EC tablet           Outstanding Labs/Studies   N/a  Duration of Discharge Encounter   Greater than 30 minutes including physician time.  Signed, Reino Bellis NP-C 04/14/2017, 3:50 PM

## 2017-04-14 NOTE — Progress Notes (Signed)
Progress Note  Patient Name: [name] Date of Encounter: 04/14/2017  Primary Cardiologist: [place]   Patient continues to have chest discomfort worse with inspiration. Rates pain as 4/10. Felt short of breath when up to the bathroom.  Inpatient Medications    Scheduled Meds: . apixaban  5 mg Oral BID  . aspirin EC  81 mg Oral Daily  . colchicine  0.6 mg Oral BID  . diltiazem  240 mg Oral Daily  . furosemide  20 mg Oral Daily  . hydrochlorothiazide  12.5 mg Oral Daily  . indomethacin  50 mg Oral BID WC  . insulin aspart  0-15 Units Subcutaneous TID WC  . insulin aspart  0-5 Units Subcutaneous QHS  . insulin aspart protamine- aspart  25 Units Subcutaneous Q supper  . insulin aspart protamine- aspart  35 Units Subcutaneous Q breakfast  . losartan  25 mg Oral Daily  . metoprolol tartrate  25 mg Oral BID  . pantoprazole  40 mg Oral Daily  . rosuvastatin  5 mg Oral Daily  . sodium chloride flush  3 mL Intravenous Q12H   Continuous Infusions:  PRN Meds: acetaminophen   Vital Signs    Vitals:   04/13/17 1627 04/13/17 2106 04/13/17 2352 04/14/17 0433  BP: (!) 144/65 139/60 (!) 145/63 (!) 141/85  Pulse: 68 63 (!) 51 (!) 54  Resp: _0 Temp:  98.4 F (36.9 C) 98.1 F (36.7 C) 98 F (36.7 C)  TempSrc:      SpO2: 100% 100% 100% 98%  Weight:    252 lb (114.3 kg)  Height:        Intake/Output Summary (Last 24 hours) at 04/14/17 0734 Last data filed at 04/14/17 0437  Gross per 24 hour  Intake              480 ml  Output                0 ml  Net              480 ml   Filed Weights   04/12/17 1419 04/13/17 0448 04/14/17 0433  Weight: 256 lb 8 oz (116.3 kg) 253 lb 14.4 oz (115.2 kg) 252 lb (114.3 kg)    Telemetry    Sinus bradycardia/sinus rhythm, no recurrence of atrial fibrillation - Personally Reviewed  ECG    NSR, minimal diffuse ST elevation and PR depression - Personally Reviewed  Physical Exam  Pleasant obese woman in no  distress GEN: No acute distress.   Neck: No JVD Cardiac: RRR, loud pericardial friction rub appreciated Respiratory: Clear to auscultation bilaterally. GI: Soft, nontender, non-distended  MS: No edema; No deformity. Neuro:  Nonfocal  Psych: Normal affect   Labs    Chemistry Recent Labs Lab 04/12/17 0235 04/13/17 0228  NA 139 141  K 4.1 3.7  CL 104 106  CO2 28 28  GLUCOSE 115* 127*  BUN 24* 28*  CREATININE 0.95 1.01*  CALCIUM 9.5 9.2  PROT  --  6.2*  ALBUMIN  --  3.4*  AST  --  24  ALT  --  22  ALKPHOS  --  99  BILITOT  --  0.5  GFRNONAA >60 >60  GFRAA >60 >60  ANIONGAP 7 7     Hematology Recent Labs Lab 04/12/17 0235 04/13/17 0228  WBC 7.5 6.0  RBC 4.39 3.73*  HGB 13.4 11.3*  HCT 41.5 35.0*  MCV 94.5 93.8  MCH 30.5 30.3  MCHC 32.3 32.3  RDW 12.4 12.2  PLT 255 226    Cardiac Enzymes Recent Labs Lab 04/12/17 0235 04/12/17 1620 04/12/17 2010 04/13/17 0228  TROPONINI <0.03 <0.03 <0.03 <0.03   No results for input(s): TROPIPOC in the last 168 hours.   BNP Recent Labs Lab 04/12/17 1620  BNP 59.8     DDimer No results for input(s): DDIMER in the last 168 hours.   Radiology    No results found.  Cardiac Studies   2D Echo: Left ventricle:  The cavity size was normal. Wall thickness was normal. Systolic function was normal. The estimated ejection fraction was in the range of 55% to 60%.  ------------------------------------------------------------------- Aortic valve:   Trileaflet; mildly thickened, mildly calcified leaflets.  Doppler:   There was no stenosis.   There was moderate regurgitation.    VTI ratio of LVOT to aortic valve: 0.63. Valve area (VTI): 1.99 cm^2. Indexed valve area (VTI): 0.83 cm^2/m^2. Peak velocity ratio of LVOT to aortic valve: 0.65. Valve area (Vmax): 2.04 cm^2. Indexed valve area (Vmax): 0.85 cm^2/m^2. Mean velocity ratio of LVOT to aortic valve: 0.62. Valve area (Vmean): 1.95 cm^2. Indexed valve area (Vmean):  0.81 cm^2/m^2.    Mean gradient (S): 7 mm Hg. Peak gradient (S): 13 mm Hg.  ------------------------------------------------------------------- Aorta:  The aorta was normal, not dilated, and non-diseased.  ------------------------------------------------------------------- Mitral valve:   Mildly thickened leaflets .  Doppler:  There was trivial regurgitation.    Peak gradient (D): 2 mm Hg.  ------------------------------------------------------------------- Left atrium:  The atrium was normal in size.  ------------------------------------------------------------------- Atrial septum:  No defect or patent foramen ovale was identified.   ------------------------------------------------------------------- Right ventricle:  The cavity size was normal. Wall thickness was normal. Systolic function was normal.  ------------------------------------------------------------------- Pulmonic valve:    Doppler:  There was trivial regurgitation.  ------------------------------------------------------------------- Tricuspid valve:   Doppler:  There was mild regurgitation.  ------------------------------------------------------------------- Right atrium:  The atrium was normal in size.  ------------------------------------------------------------------- Pericardium:  The pericardium was normal in appearance.  ------------------------------------------------------------------- Systemic veins: Inferior vena cava: The vessel was normal in size. The respirophasic diameter changes were in the normal range (>= 50%), consistent with normal central venous pressure.  ------------------------------------------------------------------- Post procedure conclusions Ascending Aorta:  - The aorta was normal, not dilated, and non-diseased.   Patient Profile     61 y.o. female with hx of recurrent pericarditis admitted with chest pain and atrial fibrillation with RVR, spontaneously converted  to sinus rhythm in the Emergency Dept  Assessment & Plan    1. AF with RVR: converted spontaneously to sinus rhythm. Anticoagulated with Eliquis. Continue diltiazem 240 mg daily.  2. Pericarditis, recurrent: ESR 12. Echocardiogram is reviewed demonstrating no pericardial effusion. The patient has symptoms of recurrent pericarditis refractory to a combination of colchicine and nonsteroidal anti-inflammatories. Well steroids are not ideal considering some data suggesting increase risk of long-term complications, this patient has refractory symptoms despite optimal treatment. I think her best option as a slow steroid taper. Will start her on prednisone 40 mg daily 2 weeks, then slowly taper over 6 weeks. Cardiac MRI pending to evaluate for constriction.  3. Type 2 diabetes: Patient will continue on insulin. Blood sugars are reviewed and are well controlled.  4. Hypertension: Blood pressure well controlled on current medicines.  Disposition: mobilize today, await MRI, DC home next 24 hours if stable  Signed, Sherren Mocha, MD  04/14/2017, 7:34 AM

## 2017-04-18 ENCOUNTER — Ambulatory Visit (HOSPITAL_COMMUNITY): Payer: Medicare Other

## 2017-04-29 ENCOUNTER — Telehealth: Payer: Self-pay

## 2017-04-29 ENCOUNTER — Ambulatory Visit: Payer: Medicare Other

## 2017-04-29 VITALS — Ht 67.5 in | Wt 276.2 lb

## 2017-04-29 DIAGNOSIS — R1310 Dysphagia, unspecified: Secondary | ICD-10-CM

## 2017-04-29 NOTE — Telephone Encounter (Signed)
Dr Ardis Hughs, This pt is here for her pre-visit prior to her endoscopy with you on 05/06/17. She was in the ED on 06/22 thru 06/25 due to A-fib and is taking Eliquis. Pt has a complicated medical history. She is scheduled to see Dr. Ena Dawley or the PA the am of the endoscopy on 05/06/17.  Does the pt need medical clearance prior to the procedure or is she OK to proceed with the endoscopy? Thanks for your help. Gwyndolyn Saxon

## 2017-04-29 NOTE — Telephone Encounter (Signed)
Called pt and left message to call our office.

## 2017-04-29 NOTE — Telephone Encounter (Signed)
I reviewed her recent admission. SHe has recurrent pericardititis, was put on steroids. Please cancel her upcoming EGD and reschedule it for sometime in late august to allow her more time to recover from her cardiac issues.

## 2017-04-29 NOTE — Telephone Encounter (Signed)
Pt returned call. Per Dr Ardis Hughs, pt needs time to recover from cardiac isssues and to call back to reschedule her endoscopy in late August. Pt understood. The endoscopy on 05/06/17 was cancelled. Gwyndolyn Saxon

## 2017-04-29 NOTE — Progress Notes (Signed)
Per pt, no allergies to soy or egg products.Pt not taking any weight loss meds or using  O2 at home.   Pt refuse Emmi video.

## 2017-05-06 ENCOUNTER — Encounter: Payer: Self-pay | Admitting: Physician Assistant

## 2017-05-06 ENCOUNTER — Ambulatory Visit (INDEPENDENT_AMBULATORY_CARE_PROVIDER_SITE_OTHER): Payer: Medicare Other | Admitting: Physician Assistant

## 2017-05-06 ENCOUNTER — Encounter: Payer: Medicare Other | Admitting: Gastroenterology

## 2017-05-06 VITALS — BP 110/80 | HR 74 | Ht 67.5 in | Wt 255.2 lb

## 2017-05-06 DIAGNOSIS — I2510 Atherosclerotic heart disease of native coronary artery without angina pectoris: Secondary | ICD-10-CM | POA: Diagnosis not present

## 2017-05-06 DIAGNOSIS — I308 Other forms of acute pericarditis: Secondary | ICD-10-CM

## 2017-05-06 DIAGNOSIS — I10 Essential (primary) hypertension: Secondary | ICD-10-CM | POA: Diagnosis not present

## 2017-05-06 DIAGNOSIS — Z9861 Coronary angioplasty status: Secondary | ICD-10-CM | POA: Diagnosis not present

## 2017-05-06 DIAGNOSIS — I4891 Unspecified atrial fibrillation: Secondary | ICD-10-CM | POA: Diagnosis not present

## 2017-05-06 NOTE — Progress Notes (Signed)
Cardiology Office Note    Date:  05/06/2017   ID:  [name], DOB [birth year], MRN [id number]  PCP:  Patient, No Pcp Per  Cardiologist: Dr. [name]  No chief complaint on file.   History of Present Illness:  [name] is a 61 y.o. female with a history of CAD s/p DES to [facility] and mLAD 07/11/15, chronic pericardial effusion, DM, HTN, remote h/o treated non-small cell lung cancer, pulmonary fibrosis, aortic insufficiency, COPD, anemia and mod AI by echo 05/2015. PAF converted to NSR 08/2016 on metoprolol and eliquis for CHADSVASC=4.  Acute pericarditis 11/2016 treated with colchicine and ibuprofen. Cardiac cath 11/2016 patent stent in the proximal to mid LAD, patent stent in the mid circumflex diagonal branch small caliber vessel and jailed by the LAD stent. Severe disease in the small branch but PCI's not a good option. Small nondominant RCA and normal LV systolic function.  Last saw Dr. [name] 03/26/17 still complaining of significant positional chest pain with loud pericardial rub on physical exam and worsening dyspnea on exertion. She was worried about constrictive pericarditis and ordered a cardiac MRI. This was done 04/14/17 and showed a moderate pericardial effusion lateral to the RV free wall and posterior LV myocardium. This was not appreciated on recent echo. There was normal pericardial thickness with mild gadolinium uptake over the RV free wall and apex. There was no evidence of constriction, no evidence of myocarditis normal LVEF 59%.  Patient went to the emergency room 04/14/17 was in A. fib with RVR and spontaneously converted to normal sinus rhythm. Aspirin was stopped this admission. Echocardiogram did not demonstrate pericardial effusion. Because of her recurrent symptoms of pericarditis refractory to colchicine and NSAIDs a steroid taper over 6 weeks was ordered.  Patient comes in today accompanied by her sister. She is not feeling any better. She still gets chest pain when laying  down are very short distance walking. She said when she walked from the parking lot into here today she was sure she was in atrial fibrillation but after she sat in the waiting room for a little while her heart rate slowed down and she is actually in normal sinus rhythm.  Past Medical History:  Diagnosis Date  . Allergy   . Anemia   . Anemia   . Aortic insufficiency    a. Mod by echo 05/2015.  . Arthritis    "knees" (07/11/2015)  . Atrial fibrillation (HCC)    On Eliquis  . Carcinoma of hilus of lung ([place]) 01/20/2012   IIIB NSCL  Right hilar mass compressing esophagus/presenting with dysphagia Rx Surgery/RT/chemo dx January 2001  . Complication of anesthesia    "I had a hard time waking me up w/one of my shoulder surgeries"  . Coronary artery disease    a. Abnormal stress test -> LHc 06/2015 s/p DES to mCX and mLAD, residual D2 disease treated medially.  . Elevated blood pressure   . Erythema nodosum   . Heart murmur   . Insomnia   . lung ca dx'd 10/1999   chemo/xrt comp 05/29/2000  . Myalgia   . Nephrolithiasis 2015  . Obesity   . Pericardial effusion    a. 05/2015 Echo: EF 55-60%, no rwma, Gr 1 DD, no effusion - but an effusion was seen on CT which was felt to be increased in size compared to 12/2014 - pericardium also thickened.   . Pericarditis    a. Dx 05/2015.  [name] Pneumothorax, spontaneous, tension 01/20/2012  October, 2010  . Pulmonary fibrosis ([place]) 01/20/2012   Due to previous surgery and chest radiation for lung cancer  . Restrictive lung disease   . Type II diabetes mellitus ([place])     Past Surgical History:  Procedure Laterality Date  . ABDOMINAL HYSTERECTOMY  2008  . APPENDECTOMY  1969?  [name] CARDIAC CATHETERIZATION N/A 07/11/2015   Procedure: Left Heart Cath and Coronary Angiography;  Surgeon: Jettie Booze, MD;  Location: [facility];  Service: Cardiovascular;  Laterality: N/A;  . CARDIAC CATHETERIZATION  07/11/2015   Procedure: Coronary Stent Intervention;   Surgeon: Jettie Booze, MD;  Location: [facility];  Service: Cardiovascular;;  . CARPAL TUNNEL RELEASE Bilateral 1998-2005?   right-left  . CARPAL TUNNEL RELEASE Left 2009?   "for the 2nd time"  . COLONOSCOPY    . CORONARY ANGIOPLASTY    . CYSTOSCOPY W/ STONE MANIPULATION  2013  . ESOPHAGOGASTRODUODENOSCOPY (EGD) WITH ESOPHAGEAL DILATION  2012  . FOOT SURGERY Bilateral 1980's   Callous removed   . INNER EAR SURGERY Right ~ 2009  . KNEE ARTHROPLASTY Right 1991  . KNEE ARTHROSCOPY  ~ 2000   LATERAL RELEASE  . LEFT HEART CATH AND CORONARY ANGIOGRAPHY N/A 12/18/2016   Procedure: Left Heart Cath and Coronary Angiography;  Surgeon: Burnell Blanks, MD;  Location: [facility];  Service: Cardiovascular;  Laterality: N/A;  . LUNG CANCER SURGERY Left 2001   "small cell"  . LUNG SURGERY Right 2007   "reinflated it"  . PULMONARY EMBOLISM SURGERY  2007   LUNG COLLAPSE  . SHOULDER ADHESION RELEASE Right ~ 2004  . SHOULDER ARTHROSCOPY W/ ROTATOR CUFF REPAIR Right ~ 2003    Current Medications: No outpatient prescriptions have been marked as taking for the 05/06/17 encounter (Appointment) with Imogene Burn, PA-C.     Allergies:   Hydrocodone; Morphine and related; Lisinopril; and Nsaids   Social History   Social History  . Marital status: Single    Spouse name: N/A  . Number of children: 0  . Years of education: N/A   Occupational History  . RETIRED    Social History Main Topics  . Smoking status: Former Smoker    Packs/day: 0.25    Years: 5.00    Types: Cigarettes    Quit date: 07/22/1999  . Smokeless tobacco: Never Used  . Alcohol use No  . Drug use: No  . Sexual activity: Not Currently    Birth control/ protection: Other-see comments     Comment: hysterectomy   Other Topics Concern  . Not on file   Social History Narrative  . No narrative on file     Family History:  The patient's family history includes Diabetes in her father, maternal  grandmother, mother, paternal grandmother, sister, sister, sister, and sister; Heart disease in her father; Hypertension in her sister; Kidney disease in her sister; Pancreatic cancer in her mother.   ROS:   Please see the history of present illness.    Review of Systems  Constitution: Negative.  HENT: Negative.   Eyes: Negative.   Cardiovascular: Positive for chest pain, dyspnea on exertion and palpitations.  Respiratory: Negative.   Hematologic/Lymphatic: Negative.   Musculoskeletal: Negative.  Negative for joint pain.  Gastrointestinal: Negative.   Genitourinary: Negative.   Neurological: Negative.    All other systems reviewed and are negative.   PHYSICAL EXAM:   VS:  There were no vitals taken for this visit.  Physical Exam  GEN: Well nourished, well developed, in no acute distress  Neck: no JVD, carotid bruits, or masses Cardiac:RRR; Loud 3/6 rub Respiratory:  clear to auscultation bilaterally, normal work of breathing GI: soft, nontender, nondistended, + BS Ext: without cyanosis, clubbing, or edema, Good distal pulses bilaterally Neuro:  Alert and Oriented x 3 Psych: euthymic mood, full affect  Wt Readings from Last 3 Encounters:  04/29/17 276 lb 3.2 oz (125.3 kg)  04/14/17 252 lb (114.3 kg)  03/26/17 253 lb (114.8 kg)      Studies/Labs Reviewed:   EKG:  EKG is  ordered today.  The ekg ordered today demonstrates Normal sinus rhythm with ST elevation throughout  Recent Labs: 04/12/2017: B Natriuretic Peptide 59.8; TSH 2.776 04/13/2017: ALT 22; BUN 28; Creatinine, Ser 1.01; Hemoglobin 11.3; Platelets 226; Potassium 3.7; Sodium 141   Lipid Panel    Component Value Date/Time   CHOL 104 (L) 08/04/2015 1127   TRIG 74 08/04/2015 1127   HDL 47 08/04/2015 1127   CHOLHDL 2.2 08/04/2015 1127   VLDL 15 08/04/2015 1127   LDLCALC 42 08/04/2015 1127    Additional studies/ records that were reviewed today include:  Cardiac MRIIMPRESSION: 1) Moderate pericardial effusion  lateral to the RV free wall and posterior LV myocardium. Note this is not appreciated on recent echo   2) Normal pericardial thickness with mild gadolinium uptake over the RV free wall and apex on 3 chamber view   3) No evidence of constriction although MRI especially with no free breathing sequences not ideal to assess Pericardium normal thickness and no ventricular inter dependence   4) Normal LV EF 59% (EDV 105 ESV 43 SV 62 cc)   5) No evidence of myocarditis or LV myocardial uptake of gadolinium on delayed inversion recovery sequences   6) Tri leaflet aortic valve with AR   7) Mild LAE   Images were also reviewed by Dr Loralie Champagne who agreed with above findings   Jenkins Rouge     Electronically Signed   By: Jenkins Rouge M.D.   On: 04/14/2017 15:12 TTE: 04/13/17   Study Conclusions   - Left ventricle: The cavity size was normal. Wall thickness was   normal. Systolic function was normal. The estimated ejection   fraction was in the range of 55% to 60%. - Aortic valve: There was moderate regurgitation. Valve area (VTI):   1.99 cm^2. Valve area (Vmax): 2.04 cm^2. Valve area (Vmean): 1.95   cm^2. - Atrial septum: No defect or patent foramen ovale was identified. _____________ Nuclear stress test: 11/29/2015  Nuclear stress EF: 62%.  There is a small defect of mild severity present in the mid anteroseptal location. The defect is non-reversible and consistent with breast attenuation artifact vs. prior infarct. No ischemia noted.  This is a low risk study.  The left ventricular ejection fraction is normal (55-65%).  Compared to study of 2016, there is now normal perfusion in the apical anterior and apical myocardium and the defect in the mid anteroseptum is fixed.   TTE: 09/18/2016   - Left ventricle: The cavity size was normal. Wall thickness was   normal. Systolic function was normal. The estimated ejection   fraction was in the range of 55% to 60%. Wall motion  was normal;   there were no regional wall motion abnormalities. Doppler   parameters are consistent with abnormal left ventricular   relaxation (grade 1 diastolic dysfunction). - Aortic valve: There was no stenosis. There was mild to moderate  regurgitation. - Mitral valve: There was trivial regurgitation. - Right ventricle: The cavity size was normal. Systolic function   was normal. - Right atrium: The atrium was mildly dilated. - Pulmonic valve: Turbulence across the pulmonic valve but peak   gradient 15 mmHg does not suggest significant stenosis. There was   mild regurgitation. Left atrium:  The atrium was normal in size. - Pulmonary arteries: PA peak pressure: 28 mm Hg (S). - Inferior vena cava: The vessel was normal in size. The   respirophasic diameter changes were in the normal range (>= 50%),   consistent with normal central venous pressure. - Pericardium, extracardiac: Small pericardial effusion, no   tamponade.   Impressions: - Normal LV size with EF 55-60%. Normal RV size and systolic   function. Mild to moderate aortic insufficiency. Turbulence   across the pulmonic valve but no significant stenosis by doppler   measurement.   LHC 11/2016 1. Double vessel CAD, stable.  2. Patent stent in the proximal to mid LAD with no restenosis 3. Patent stent in the mid Circumflex with no restenosis 4. The Diagonal branch is a small caliber vessel that is jailed by the LAD stent. There is severe disease in this small branch but PCI is not a good option 5. Small non-dominant RCA 6. Normal LV systolic function   Recommendations: Continue medical management of CAD.        ASSESSMENT:    1. Other acute pericarditis   2. CAD S/P percutaneous coronary angioplasty   3. Atrial fibrillation with RVR ([place])   4. Hypertension, essential      PLAN:  In order of problems listed above:  Acute pericarditis refractory to colchicine and NSAIDs now on steroid taper still having  significant pain and rub on exam. We'll have her finish off the steroid taper which is for 2 more weeks. Follow-up with Dr. [name].  CAD s/p DES to [facility] and mLAD 07/11/15 stable without angina no restenosis on cath in 11/2016  Atrial fibrillation with RVR converted to normal sinus rhythm and maintaining normal sinus rhythm on Cardizem 240 mg daily metoprolol 25 mg twice a day. There is question whether she was referred to the A. fib clinic. Will confirm with Dr. [name].  Hypertension blood pressure on the low side and she is on 2 diuretics. We'll stop HCTZ.  Medication Adjustments/Labs and Tests Ordered: Current medicines are reviewed at length with the patient today.  Concerns regarding medicines are outlined above.  Medication changes, Labs and Tests ordered today are listed in the Patient Instructions below. There are no Patient Instructions on file for this visit.   Sumner Boast, PA-C  05/06/2017 9:19 AM    [facility] [address], [place], [place]  [address] Phone: [phone]; Fax: [phone]

## 2017-05-06 NOTE — Patient Instructions (Addendum)
Medication Instructions:  1. STOP HCTZ  Labwork: NONE ORDERED TODAY  Testing/Procedures: NONE ORDERED TODAY  Follow-Up:  2. PER MICHELE LENZE, PAC YOU WILL NEED TO SEE DR. [name] IN 1 MONTH. I WILL HAVE DR. Francesca Oman NURSE IVY CALL YOU WITH AN APPT.   Any Other Special Instructions Will Be Listed Below (If Applicable).     If you need a refill on your cardiac medications before your next appointment, please call your pharmacy.

## 2017-06-01 ENCOUNTER — Other Ambulatory Visit: Payer: Self-pay | Admitting: Cardiology

## 2017-06-02 NOTE — Telephone Encounter (Signed)
Pt saw M. Gwendel Hanson 04/2017. Wt 115.8Kg, Age 61 yrs old. SCr 03/2017 1.01. Will refill Eliquis 5mg  BID.

## 2017-06-13 ENCOUNTER — Ambulatory Visit (INDEPENDENT_AMBULATORY_CARE_PROVIDER_SITE_OTHER): Payer: Medicare Other | Admitting: Cardiology

## 2017-06-13 ENCOUNTER — Encounter: Payer: Self-pay | Admitting: *Deleted

## 2017-06-13 ENCOUNTER — Other Ambulatory Visit: Payer: Self-pay | Admitting: *Deleted

## 2017-06-13 ENCOUNTER — Encounter: Payer: Self-pay | Admitting: Cardiology

## 2017-06-13 ENCOUNTER — Encounter (INDEPENDENT_AMBULATORY_CARE_PROVIDER_SITE_OTHER): Payer: Self-pay

## 2017-06-13 VITALS — BP 132/72 | HR 60 | Ht 67.5 in | Wt 267.0 lb

## 2017-06-13 DIAGNOSIS — I319 Disease of pericardium, unspecified: Secondary | ICD-10-CM | POA: Insufficient documentation

## 2017-06-13 DIAGNOSIS — Z01812 Encounter for preprocedural laboratory examination: Secondary | ICD-10-CM

## 2017-06-13 DIAGNOSIS — I311 Chronic constrictive pericarditis: Secondary | ICD-10-CM

## 2017-06-13 DIAGNOSIS — R079 Chest pain, unspecified: Secondary | ICD-10-CM | POA: Diagnosis not present

## 2017-06-13 NOTE — Progress Notes (Signed)
Cardiology Office Note    Date:  06/15/2017   ID:  [name], DOB [birth year], MRN [id number]  PCP:  Patient, No Pcp Per  Cardiologist: Dr. Meda [name]  Chief complain: chest pain  History of Present Illness:  [name] is a 61 y.o. female with a history of CAD s/p DES to [name] and mLAD 07/11/15, chronic pericardial effusion, DM, HTN, remote h/o treated non-small cell lung cancer, pulmonary fibrosis, aortic insufficiency, COPD, anemia and mod AI by echo 05/2015. PAF converted to NSR 08/2016 on metoprolol and eliquis for CHADSVASC=4.  Acute pericarditis 11/2016 treated with colchicine and ibuprofen. Cardiac cath 11/2016 patent stent in the proximal to mid LAD, patent stent in the mid circumflex diagonal branch small caliber vessel and jailed by the LAD stent. Severe disease in the small branch but PCI's not a good option. Small nondominant RCA and normal LV systolic function.  Last saw Dr. Meda [name] 03/26/17 still complaining of significant positional chest pain with loud pericardial rub on physical exam and worsening dyspnea on exertion. She was worried about constrictive pericarditis and ordered a cardiac MRI. This was done 04/14/17 and showed a moderate pericardial effusion lateral to the RV free wall and posterior LV myocardium. This was not appreciated on recent echo. There was normal pericardial thickness with mild gadolinium uptake over the RV free wall and apex. There was no evidence of constriction, no evidence of myocarditis normal LVEF 59%.  Patient went to the emergency room 04/14/17 was in A. fib with RVR and spontaneously converted to normal sinus rhythm. Aspirin was stopped this admission. Echocardiogram did not demonstrate pericardial effusion. Because of her recurrent symptoms of pericarditis refractory to colchicine and NSAIDs a steroid taper over 6 weeks was ordered.  06/13/17 - Patient comes in today accompanied by her sister. She is not feeling any better. She still gets chest pain when  laying down are very short distance walking. Her shortness of breath is worsening, now even when walking around the house.  She experiences chest pain daily and steroids provided no relief.  Past Medical History:  Diagnosis Date  . Allergy   . Anemia   . Anemia   . Aortic insufficiency    a. Mod by echo 05/2015.  . Arthritis    "knees" (07/11/2015)  . Atrial fibrillation (HCC)    On Eliquis  . Carcinoma of hilus of lung ([place]) 01/20/2012   IIIB NSCL  Right hilar mass compressing esophagus/presenting with dysphagia Rx Surgery/RT/chemo dx January 2001  . Complication of anesthesia    "I had a hard time waking me up w/one of my shoulder surgeries"  . Coronary artery disease    a. Abnormal stress test -> LHc 06/2015 s/p DES to mCX and mLAD, residual D2 disease treated medially.  . Elevated blood pressure   . Erythema nodosum   . Heart murmur   . Insomnia   . lung ca dx'd 10/1999   chemo/xrt comp 05/29/2000  . Myalgia   . Nephrolithiasis 2015  . Obesity   . Pericardial effusion    a. 05/2015 Echo: EF 55-60%, no rwma, Gr 1 DD, no effusion - but an effusion was seen on CT which was felt to be increased in size compared to 12/2014 - pericardium also thickened.   . Pericarditis    a. Dx 05/2015.  [name] Pneumothorax, spontaneous, tension 01/20/2012   October, 2010  . Pulmonary fibrosis ([place]) 01/20/2012   Due to previous surgery and chest radiation for lung cancer  .  Restrictive lung disease   . Type II diabetes mellitus ([place])     Past Surgical History:  Procedure Laterality Date  . ABDOMINAL HYSTERECTOMY  2008  . APPENDECTOMY  1969?  [name] CARDIAC CATHETERIZATION N/A 07/11/2015   Procedure: Left Heart Cath and Coronary Angiography;  Surgeon: Jettie Booze, MD;  Location: [facility];  Service: Cardiovascular;  Laterality: N/A;  . CARDIAC CATHETERIZATION  07/11/2015   Procedure: Coronary Stent Intervention;  Surgeon: Jettie Booze, MD;  Location: [facility];  Service:  Cardiovascular;;  . CARPAL TUNNEL RELEASE Bilateral 1998-2005?   right-left  . CARPAL TUNNEL RELEASE Left 2009?   "for the 2nd time"  . COLONOSCOPY    . CORONARY ANGIOPLASTY    . CYSTOSCOPY W/ STONE MANIPULATION  2013  . ESOPHAGOGASTRODUODENOSCOPY (EGD) WITH ESOPHAGEAL DILATION  2012  . FOOT SURGERY Bilateral 1980's   Callous removed   . INNER EAR SURGERY Right ~ 2009  . KNEE ARTHROPLASTY Right 1991  . KNEE ARTHROSCOPY  ~ 2000   LATERAL RELEASE  . LEFT HEART CATH AND CORONARY ANGIOGRAPHY N/A 12/18/2016   Procedure: Left Heart Cath and Coronary Angiography;  Surgeon: Burnell Blanks, MD;  Location: [facility];  Service: Cardiovascular;  Laterality: N/A;  . LUNG CANCER SURGERY Left 2001   "small cell"  . LUNG SURGERY Right 2007   "reinflated it"  . PULMONARY EMBOLISM SURGERY  2007   LUNG COLLAPSE  . SHOULDER ADHESION RELEASE Right ~ 2004  . SHOULDER ARTHROSCOPY W/ ROTATOR CUFF REPAIR Right ~ 2003    Current Medications: Current Meds  Medication Sig  . diltiazem (CARDIZEM CD) 240 MG 24 hr capsule Take 1 capsule (240 mg total) by mouth daily.  [name] ELIQUIS 5 MG TABS tablet TAKE 1 TABLET BY MOUTH 2 (TWO) TIMES DAILY.  . furosemide (LASIX) 20 MG tablet Take 1 tablet (20 mg total) by mouth daily.  [name] HUMALOG MIX 75/25 KWIKPEN (75-25) 100 UNIT/ML Kwikpen Inject 25-35 Units into the skin 2 (two) times daily. Inject 35 units subque in Am and 25 units subque in PM  . levothyroxine (SYNTHROID, LEVOTHROID) 50 MCG tablet Take 50 mcg by mouth daily.  [name] losartan (COZAAR) 25 MG tablet Take 1 tablet (25 mg total) by mouth daily.  . meloxicam (MOBIC) 15 MG tablet Take 1 tablet by mouth daily with breakfast.  . metoprolol tartrate (LOPRESSOR) 25 MG tablet Take 1 tablet (25 mg total) by mouth 2 (two) times daily.  . rosuvastatin (CRESTOR) 5 MG tablet Take 1 tablet (5 mg total) by mouth daily.     Allergies:   Hydrocodone; Morphine and related; Lisinopril; and Nsaids   Social History     Social History  . Marital status: Single    Spouse name: N/A  . Number of children: 0  . Years of education: N/A   Occupational History  . RETIRED    Social History Main Topics  . Smoking status: Former Smoker    Packs/day: 0.25    Years: 5.00    Types: Cigarettes    Quit date: 07/22/1999  . Smokeless tobacco: Never Used  . Alcohol use No  . Drug use: No  . Sexual activity: Not Currently    Birth control/ protection: Other-see comments     Comment: hysterectomy   Other Topics Concern  . None   Social History Narrative  . None     Family History:  The patient's family history includes Diabetes in her father, maternal  grandmother, mother, paternal grandmother, sister, sister, sister, and sister; Heart disease in her father; Hypertension in her sister; Kidney disease in her sister; Pancreatic cancer in her mother.   ROS:   Please see the history of present illness.    Review of Systems  Constitution: Negative.  HENT: Negative.   Eyes: Negative.   Cardiovascular: Positive for chest pain, dyspnea on exertion and palpitations.  Respiratory: Negative.   Hematologic/Lymphatic: Negative.   Musculoskeletal: Negative.  Negative for joint pain.  Gastrointestinal: Negative.   Genitourinary: Negative.   Neurological: Negative.    All other systems reviewed and are negative.   PHYSICAL EXAM:   VS:  BP 132/72   Pulse 60   Ht 5' 7.5" (1.715 m)   Wt 267 lb (121.1 kg)   BMI 41.20 kg/m   Physical Exam  GEN: Well nourished, well developed, in no acute distress  Neck: no JVD, carotid bruits, or masses Cardiac:RRR; Loud 3/6 rub in systole and diastole Respiratory:  clear to auscultation bilaterally, normal work of breathing GI: soft, nontender, nondistended, + BS Ext: without cyanosis, clubbing, or edema, Good distal pulses bilaterally Neuro:  Alert and Oriented x 3 Psych: euthymic mood, full affect  Wt Readings from Last 3 Encounters:  06/13/17 267 lb (121.1 kg)   05/06/17 255 lb 3.2 oz (115.8 kg)  04/29/17 276 lb 3.2 oz (125.3 kg)      Studies/Labs Reviewed:   EKG:  EKG is  ordered today.  The ekg ordered today demonstrates Normal sinus rhythm with ST elevation throughout  Recent Labs: 04/12/2017: B Natriuretic Peptide 59.8; TSH 2.776 04/13/2017: ALT 22; BUN 28; Creatinine, Ser 1.01; Hemoglobin 11.3; Platelets 226; Potassium 3.7; Sodium 141   Lipid Panel    Component Value Date/Time   CHOL 104 (L) 08/04/2015 1127   TRIG 74 08/04/2015 1127   HDL 47 08/04/2015 1127   CHOLHDL 2.2 08/04/2015 1127   VLDL 15 08/04/2015 1127   LDLCALC 42 08/04/2015 1127    Additional studies/ records that were reviewed today include:   Cardiac MRI 04/14/2017  1) Moderate pericardial effusion lateral to the RV free wall and posterior LV myocardium. Note this is not appreciated on recent echo   2) Normal pericardial thickness with mild gadolinium uptake over the RV free wall and apex on 3 chamber view   3) No evidence of constriction although MRI especially with no free breathing sequences not ideal to assess Pericardium normal thickness and no ventricular inter dependence   4) Normal LV EF 59% (EDV 105 ESV 43 SV 62 cc)   5) No evidence of myocarditis or LV myocardial uptake of gadolinium on delayed inversion recovery sequences   6) Tri leaflet aortic valve with AR   7) Mild LAE   Images were also reviewed by Dr Loralie Champagne who agreed with above findings   TTE: 04/13/17   Study Conclusions   - Left ventricle: The cavity size was normal. Wall thickness was   normal. Systolic function was normal. The estimated ejection   fraction was in the range of 55% to 60%. - Aortic valve: There was moderate regurgitation. Valve area (VTI):   1.99 cm^2. Valve area (Vmax): 2.04 cm^2. Valve area (Vmean): 1.95   cm^2. - Atrial septum: No defect or patent foramen ovale was identified. _____________ Nuclear stress test: 11/29/2015  Nuclear stress EF:  62%.  There is a small defect of mild severity present in the mid anteroseptal location. The defect is non-reversible and consistent with breast attenuation  artifact vs. prior infarct. No ischemia noted.  This is a low risk study.  The left ventricular ejection fraction is normal (55-65%).  Compared to study of 2016, there is now normal perfusion in the apical anterior and apical myocardium and the defect in the mid anteroseptum is fixed.   TTE: 09/18/2016   - Left ventricle: The cavity size was normal. Wall thickness was   normal. Systolic function was normal. The estimated ejection   fraction was in the range of 55% to 60%. Wall motion was normal;   there were no regional wall motion abnormalities. Doppler   parameters are consistent with abnormal left ventricular   relaxation (grade 1 diastolic dysfunction). - Aortic valve: There was no stenosis. There was mild to moderate   regurgitation. - Mitral valve: There was trivial regurgitation. - Right ventricle: The cavity size was normal. Systolic function   was normal. - Right atrium: The atrium was mildly dilated. - Pulmonic valve: Turbulence across the pulmonic valve but peak   gradient 15 mmHg does not suggest significant stenosis. There was   mild regurgitation. Left atrium:  The atrium was normal in size. - Pulmonary arteries: PA peak pressure: 28 mm Hg (S). - Inferior vena cava: The vessel was normal in size. The   respirophasic diameter changes were in the normal range (>= 50%),   consistent with normal central venous pressure. - Pericardium, extracardiac: Small pericardial effusion, no   tamponade.   Impressions: - Normal LV size with EF 55-60%. Normal RV size and systolic   function. Mild to moderate aortic insufficiency. Turbulence   across the pulmonic valve but no significant stenosis by doppler   measurement.   LHC 11/2016 1. Double vessel CAD, stable.  2. Patent stent in the proximal to mid LAD with no  restenosis 3. Patent stent in the mid Circumflex with no restenosis 4. The Diagonal branch is a small caliber vessel that is jailed by the LAD stent. There is severe disease in this small branch but PCI is not a good option 5. Small non-dominant RCA 6. Normal LV systolic function   Recommendations: Continue medical management of CAD.    ASSESSMENT:    1. Chest pain, unspecified type   2. Constrictive pericarditis   3. Chronic constrictive pericarditis, unspecified type   4. Pre-procedure lab exam    PLAN:  In order of problems listed above:  1. Chronic pericarditis with significant rub in systole and diastole on physical exam, despite prolonged repeated treatment with NSAIDS, Colchicine and steroids.  The patient has worsening symptoms suspicious for constrictive pericarditis. We will schedule a right sided cardiac catheterization and plan for a referral to Dr Norm Parcel - a CT surgeon at [name] for a potential pericardiectomy.  2. CAD s/p DES to [facility] and mLAD 07/11/15 stable without angina no restenosis on cath in 11/2016  3. Atrial fibrillation with RVR converted to normal sinus rhythm and maintaining normal sinus rhythm on Cardizem 240 mg daily metoprolol 25 mg twice a day. There is question whether she was referred to the A. fib clinic. Will confirm with Dr. Meda [name].  4. Hypertension blood pressure - controlled.  Medication Adjustments/Labs and Tests Ordered: Current medicines are reviewed at length with the patient today.  Concerns regarding medicines are outlined above.  Medication changes, Labs and Tests ordered today are listed in the Patient Instructions below. Patient Instructions  Medication Instructions:   Your physician recommends that you continue on your current medications as directed. Please refer to the  Current Medication list given to you today.    Labwork:  COME TO THE OFFICE ON Friday June 20, 2017 FOR YOUR PRE-CATH LABS--WE WILL CHECK A BMET, CBC W DIFF, AND  PT/INR     Testing/Procedures:  Your physician has requested that you have a cardiac catheterization. Cardiac catheterization is used to diagnose and/or treat various heart conditions. Doctors may recommend this procedure for a number of different reasons. The most common reason is to evaluate chest pain. Chest pain can be a symptom of coronary artery disease (CAD), and cardiac catheterization can show whether plaque is narrowing or blocking your heart's arteries. This procedure is also used to evaluate the valves, as well as measure the blood flow and oxygen levels in different parts of your heart. For further information please visit [facility]tn. Please follow instruction sheet, as given.  YOUR CARDIAC CATH IS SCHEDULED FOR Wednesday June 25, 2017 AT 6:30 AM AT [facility].  PLEASE FOLLOW INSTRUCTIONS PROVIDED TO PREPARE YOU FOR YOUR CATH.    Follow-Up:  WILL BE BASED ON YOUR CARDIAC CATH RESULTS   ALSO DR. Meda [name] WILL REFER YOU TO DR. CARMELO A. MILANO AT [facility] ([place]), BASED ON YOUR CARDIAC CATH REPORT     If you need a refill on your cardiac medications before your next appointment, please call your pharmacy.      Signed, Ena Dawley, MD  06/15/2017 3:52 PM    [facility] [place], [place], [place]  [address] Phone: [phone]; Fax: [phone]

## 2017-06-13 NOTE — Patient Instructions (Addendum)
Medication Instructions:   Your physician recommends that you continue on your current medications as directed. Please refer to the Current Medication list given to you today.    Labwork:  COME TO THE OFFICE ON Friday June 20, 2017 FOR YOUR PRE-CATH LABS--WE WILL CHECK A BMET, CBC W DIFF, AND PT/INR     Testing/Procedures:  Your physician has requested that you have a cardiac catheterization. Cardiac catheterization is used to diagnose and/or treat various heart conditions. Doctors may recommend this procedure for a number of different reasons. The most common reason is to evaluate chest pain. Chest pain can be a symptom of coronary artery disease (CAD), and cardiac catheterization can show whether plaque is narrowing or blocking your heart's arteries. This procedure is also used to evaluate the valves, as well as measure the blood flow and oxygen levels in different parts of your heart. For further information please visit [facility]. Please follow instruction sheet, as given.  YOUR CARDIAC CATH IS SCHEDULED FOR Wednesday June 25, 2017 AT 6:30 AM AT [facility] TO [facility].  PLEASE FOLLOW INSTRUCTIONS PROVIDED TO PREPARE YOU FOR YOUR CATH.    Follow-Up:  WILL BE BASED ON YOUR CARDIAC CATH RESULTS   ALSO DR. [name] WILL REFER YOU TO DR. CARMELO A. MILANO AT [facility] ([place]), BASED ON YOUR CARDIAC CATH REPORT     If you need a refill on your cardiac medications before your next appointment, please call your pharmacy.

## 2017-06-16 ENCOUNTER — Telehealth: Payer: Self-pay | Admitting: Cardiology

## 2017-06-16 NOTE — Telephone Encounter (Signed)
Pt states that someone called her from this office and didn't leave a message. Pt was made aware that there is no documentation of a phone called made to her on her medical records. Pt states" well I just wont worried about it " pt  verbalized understanding.

## 2017-06-16 NOTE — Telephone Encounter (Signed)
New message    Pt states someone gave her a call and she did not get a message

## 2017-06-19 LAB — HM DIABETES EYE EXAM

## 2017-06-20 ENCOUNTER — Other Ambulatory Visit: Payer: Medicare Other | Admitting: *Deleted

## 2017-06-20 DIAGNOSIS — Z01812 Encounter for preprocedural laboratory examination: Secondary | ICD-10-CM

## 2017-06-20 DIAGNOSIS — I311 Chronic constrictive pericarditis: Secondary | ICD-10-CM

## 2017-06-20 DIAGNOSIS — R079 Chest pain, unspecified: Secondary | ICD-10-CM

## 2017-06-20 LAB — CBC WITH DIFFERENTIAL/PLATELET
Basophils Absolute: 0 10*3/uL (ref 0.0–0.2)
Basos: 0 %
EOS (ABSOLUTE): 0.3 10*3/uL (ref 0.0–0.4)
Eos: 4 %
Hematocrit: 33.8 % — ABNORMAL LOW (ref 34.0–46.6)
Hemoglobin: 11.5 g/dL (ref 11.1–15.9)
Immature Grans (Abs): 0 10*3/uL (ref 0.0–0.1)
Immature Granulocytes: 1 %
Lymphocytes Absolute: 1.9 10*3/uL (ref 0.7–3.1)
Lymphs: 27 %
MCH: 31.6 pg (ref 26.6–33.0)
MCHC: 34 g/dL (ref 31.5–35.7)
MCV: 93 fL (ref 79–97)
Monocytes Absolute: 0.6 10*3/uL (ref 0.1–0.9)
Monocytes: 8 %
Neutrophils Absolute: 4.4 10*3/uL (ref 1.4–7.0)
Neutrophils: 60 %
Platelets: 368 10*3/uL (ref 150–379)
RBC: 3.64 x10E6/uL — ABNORMAL LOW (ref 3.77–5.28)
RDW: 13.1 % (ref 12.3–15.4)
WBC: 7.2 10*3/uL (ref 3.4–10.8)

## 2017-06-20 LAB — PROTIME-INR
INR: 1 (ref 0.8–1.2)
Prothrombin Time: 10.6 s (ref 9.1–12.0)

## 2017-06-20 LAB — BASIC METABOLIC PANEL
BUN/Creatinine Ratio: 22 (ref 9–23)
BUN: 20 mg/dL (ref 6–24)
CO2: 23 mmol/L (ref 20–29)
Calcium: 9 mg/dL (ref 8.7–10.2)
Chloride: 104 mmol/L (ref 96–106)
Creatinine, Ser: 0.89 mg/dL (ref 0.57–1.00)
GFR calc Af Amer: 83 mL/min/{1.73_m2} (ref >59)
GFR calc non Af Amer: 72 mL/min/{1.73_m2} (ref >59)
Glucose: 126 mg/dL — ABNORMAL HIGH (ref 65–99)
Potassium: 3.8 mmol/L (ref 3.5–5.2)
Sodium: 145 mmol/L — ABNORMAL HIGH (ref 134–144)

## 2017-06-24 ENCOUNTER — Telehealth: Payer: Self-pay

## 2017-06-24 NOTE — Telephone Encounter (Signed)
Patient contacted pre-catheterization at [facility] scheduled for:  06/25/2017 @ 0830 Verified arrival time and place: [facility] @ 0630 Confirmed AM meds to be taken pre-cath with sip of water: Take ASA-Explained to Pt why she needed to take prior to arrival-Pt indicates understanding. Hold Eliquis-last dose Monday-confirmed Hold Lasix Take 1/2 dose Humalog night prior, hold day of Confirmed patient has responsible person to drive home post procedure and observe patient for 24 hours:  yes Addl concerns:   None noted

## 2017-06-25 ENCOUNTER — Encounter (HOSPITAL_COMMUNITY): Payer: Self-pay | Admitting: Cardiovascular Disease

## 2017-06-25 ENCOUNTER — Encounter (HOSPITAL_COMMUNITY)
Admission: RE | Disposition: A | Discharge status: Home / Self Care | Payer: Self-pay | Source: Ambulatory Visit | Attending: Cardiovascular Disease

## 2017-06-25 ENCOUNTER — Ambulatory Visit (HOSPITAL_COMMUNITY)
Admission: RE | Admit: 2017-06-25 | Discharge: 2017-06-25 | Disposition: A | Discharge status: Home / Self Care | Payer: Medicare Other | Source: Ambulatory Visit | Attending: Cardiovascular Disease | Admitting: Cardiovascular Disease

## 2017-06-25 DIAGNOSIS — I10 Essential (primary) hypertension: Secondary | ICD-10-CM | POA: Diagnosis not present

## 2017-06-25 DIAGNOSIS — Z794 Long term (current) use of insulin: Secondary | ICD-10-CM | POA: Diagnosis not present

## 2017-06-25 DIAGNOSIS — Z87891 Personal history of nicotine dependence: Secondary | ICD-10-CM | POA: Insufficient documentation

## 2017-06-25 DIAGNOSIS — Z6841 Body Mass Index (BMI) 40.0 and over, adult: Secondary | ICD-10-CM | POA: Diagnosis not present

## 2017-06-25 DIAGNOSIS — Z86711 Personal history of pulmonary embolism: Secondary | ICD-10-CM | POA: Diagnosis not present

## 2017-06-25 DIAGNOSIS — G4700 Insomnia, unspecified: Secondary | ICD-10-CM | POA: Insufficient documentation

## 2017-06-25 DIAGNOSIS — I313 Pericardial effusion (noninflammatory): Secondary | ICD-10-CM | POA: Diagnosis not present

## 2017-06-25 DIAGNOSIS — J8410 Pulmonary fibrosis, unspecified: Secondary | ICD-10-CM | POA: Insufficient documentation

## 2017-06-25 DIAGNOSIS — M170 Bilateral primary osteoarthritis of knee: Secondary | ICD-10-CM | POA: Diagnosis not present

## 2017-06-25 DIAGNOSIS — Z885 Allergy status to narcotic agent status: Secondary | ICD-10-CM | POA: Diagnosis not present

## 2017-06-25 DIAGNOSIS — I318 Other specified diseases of pericardium: Secondary | ICD-10-CM | POA: Diagnosis not present

## 2017-06-25 DIAGNOSIS — Z955 Presence of coronary angioplasty implant and graft: Secondary | ICD-10-CM | POA: Diagnosis not present

## 2017-06-25 DIAGNOSIS — E669 Obesity, unspecified: Secondary | ICD-10-CM | POA: Insufficient documentation

## 2017-06-25 DIAGNOSIS — J449 Chronic obstructive pulmonary disease, unspecified: Secondary | ICD-10-CM | POA: Diagnosis not present

## 2017-06-25 DIAGNOSIS — Z7901 Long term (current) use of anticoagulants: Secondary | ICD-10-CM | POA: Diagnosis not present

## 2017-06-25 DIAGNOSIS — I2510 Atherosclerotic heart disease of native coronary artery without angina pectoris: Secondary | ICD-10-CM | POA: Insufficient documentation

## 2017-06-25 DIAGNOSIS — E119 Type 2 diabetes mellitus without complications: Secondary | ICD-10-CM | POA: Diagnosis not present

## 2017-06-25 DIAGNOSIS — I319 Disease of pericardium, unspecified: Secondary | ICD-10-CM | POA: Diagnosis present

## 2017-06-25 DIAGNOSIS — D649 Anemia, unspecified: Secondary | ICD-10-CM | POA: Insufficient documentation

## 2017-06-25 DIAGNOSIS — I351 Nonrheumatic aortic (valve) insufficiency: Secondary | ICD-10-CM | POA: Insufficient documentation

## 2017-06-25 DIAGNOSIS — I311 Chronic constrictive pericarditis: Secondary | ICD-10-CM | POA: Insufficient documentation

## 2017-06-25 HISTORY — PX: RIGHT AND LEFT HEART CATH: CATH118262

## 2017-06-25 LAB — POCT I-STAT 3, VENOUS BLOOD GAS (G3P V)
Acid-Base Excess: 1 mmol/L (ref 0.0–2.0)
Bicarbonate: 26.2 mmol/L (ref 20.0–28.0)
O2 Saturation: 91 %
TCO2: 27 mmol/L (ref 22–32)
pCO2, Ven: 42.7 mmHg — ABNORMAL LOW (ref 44.0–60.0)
pH, Ven: 7.396 (ref 7.250–7.430)
pO2, Ven: 61 mmHg — ABNORMAL HIGH (ref 32.0–45.0)

## 2017-06-25 LAB — POCT I-STAT 3, ART BLOOD GAS (G3+)
Acid-Base Excess: 2 mmol/L (ref 0.0–2.0)
Bicarbonate: 27.6 mmol/L (ref 20.0–28.0)
O2 Saturation: 62 %
TCO2: 29 mmol/L (ref 22–32)
pCO2 arterial: 45.6 mmHg (ref 32.0–48.0)
pH, Arterial: 7.39 (ref 7.350–7.450)
pO2, Arterial: 33 mmHg — CL (ref 83.0–108.0)

## 2017-06-25 LAB — GLUCOSE, CAPILLARY: Glucose-Capillary: 167 mg/dL — ABNORMAL HIGH (ref 65–99)

## 2017-06-25 SURGERY — RIGHT AND LEFT HEART CATH
Anesthesia: LOCAL

## 2017-06-25 MED ORDER — LIDOCAINE HCL (PF) 1 % IJ SOLN
INTRAMUSCULAR | Status: DC | PRN
  Administered 2017-06-25 (×2): 2 mL via SUBCUTANEOUS

## 2017-06-25 MED ORDER — MIDAZOLAM HCL 2 MG/2ML IJ SOLN
INTRAMUSCULAR | Status: AC
  Filled 2017-06-25: qty 2

## 2017-06-25 MED ORDER — IOPAMIDOL (ISOVUE-370) INJECTION 76%
INTRAVENOUS | Status: AC
  Filled 2017-06-25: qty 100

## 2017-06-25 MED ORDER — FENTANYL CITRATE (PF) 100 MCG/2ML IJ SOLN
INTRAMUSCULAR | Status: DC | PRN
  Administered 2017-06-25: 25 ug via INTRAVENOUS

## 2017-06-25 MED ORDER — SODIUM CHLORIDE 0.9% FLUSH: 3.0000 mL | INTRAVENOUS | Status: DC | PRN

## 2017-06-25 MED ORDER — FENTANYL CITRATE (PF) 100 MCG/2ML IJ SOLN
INTRAMUSCULAR | Status: AC
  Filled 2017-06-25: qty 2

## 2017-06-25 MED ORDER — VERAPAMIL HCL 2.5 MG/ML IV SOLN
INTRAVENOUS | Status: DC | PRN
  Administered 2017-06-25: 10 mL via INTRA_ARTERIAL

## 2017-06-25 MED ORDER — SODIUM CHLORIDE 0.9 % WEIGHT BASED INFUSION: 1.0000 mL/kg/h | INTRAVENOUS | Status: DC

## 2017-06-25 MED ORDER — SODIUM CHLORIDE 0.9 % IV SOLN: 250.0000 mL | INTRAVENOUS | Status: DC | PRN

## 2017-06-25 MED ORDER — ONDANSETRON HCL 4 MG/2ML IJ SOLN
4.0000 mg | Freq: Four times a day (QID) | INTRAMUSCULAR | Status: DC | PRN
Start: 2017-06-25 — End: 2017-06-25

## 2017-06-25 MED ORDER — HEPARIN SODIUM (PORCINE) 1000 UNIT/ML IJ SOLN
INTRAMUSCULAR | Status: DC | PRN
  Administered 2017-06-25: 5000 [IU] via INTRAVENOUS

## 2017-06-25 MED ORDER — SODIUM CHLORIDE 0.9% FLUSH: 3.0000 mL | Freq: Two times a day (BID) | INTRAVENOUS | Status: DC

## 2017-06-25 MED ORDER — MIDAZOLAM HCL 2 MG/2ML IJ SOLN
INTRAMUSCULAR | Status: DC | PRN
  Administered 2017-06-25: 2 mg via INTRAVENOUS

## 2017-06-25 MED ORDER — LIDOCAINE HCL 2 % IJ SOLN
INTRAMUSCULAR | Status: AC
  Filled 2017-06-25: qty 10

## 2017-06-25 MED ORDER — SODIUM CHLORIDE 0.9 % WEIGHT BASED INFUSION
3.0000 mL/kg/h | INTRAVENOUS | Status: AC
  Administered 2017-06-25: 3 mL/kg/h via INTRAVENOUS

## 2017-06-25 MED ORDER — VERAPAMIL HCL 2.5 MG/ML IV SOLN
INTRAVENOUS | Status: AC
Start: 2017-06-25 — End: 2017-06-25
  Filled 2017-06-25: qty 2

## 2017-06-25 MED ORDER — ACETAMINOPHEN 325 MG PO TABS: 650.0000 mg | ORAL_TABLET | ORAL | Status: DC | PRN

## 2017-06-25 SURGICAL SUPPLY — 8 items
CATH BALLN WEDGE 5F 110CM (CATHETERS) ×1 IMPLANT
CATH INFINITI 5FR ANG PIGTAIL (CATHETERS) ×1 IMPLANT
DEVICE RAD COMP TR BAND LRG (VASCULAR PRODUCTS) ×1 IMPLANT
GLIDESHEATH SLEND SS 6F .021 (SHEATH) ×1 IMPLANT
GUIDEWIRE INQWIRE 1.5J.035X260 (WIRE) IMPLANT
INQWIRE 1.5J .035X260CM (WIRE) ×2
KIT HEART LEFT (KITS) ×1 IMPLANT
SHEATH GLIDE SLENDER 4/5FR (SHEATH) ×1 IMPLANT

## 2017-06-25 NOTE — Interval H&P Note (Signed)
History and Physical Interval Note:  06/25/2017 8:25 AM  [name]  has presented today for surgery, with the diagnosis of chronic paracarditis, cp, constrictive paracarditis  The various methods of treatment have been discussed with the patient and family. After consideration of risks, benefits and other options for treatment, the patient has consented to  Procedure(s): RIGHT/LEFT HEART CATH AND CORONARY ANGIOGRAPHY (N/A) as a surgical intervention .  The patient's history has been reviewed, patient examined, no change in status, stable for surgery.  I have reviewed the patient's chart and labs.  Questions were answered to the patient's satisfaction.     Sherren Mocha

## 2017-06-25 NOTE — H&P (View-Only) (Signed)
Cardiology Office Note    Date:  06/15/2017   ID:  [name], DOB February 10, 1956, MRN [id number]  PCP:  Patient, No Pcp Per  Cardiologist: Dr. [name]  Chief complain: chest pain  History of Present Illness:  [name] is a 61 y.o. female with a history of CAD s/p DES to [facility] and mLAD 07/11/15, chronic pericardial effusion, DM, HTN, remote h/o treated non-small cell lung cancer, pulmonary fibrosis, aortic insufficiency, COPD, anemia and mod AI by echo 05/2015. PAF converted to NSR 08/2016 on metoprolol and eliquis for CHADSVASC=4.  Acute pericarditis 11/2016 treated with colchicine and ibuprofen. Cardiac cath 11/2016 patent stent in the proximal to mid LAD, patent stent in the mid circumflex diagonal branch small caliber vessel and jailed by the LAD stent. Severe disease in the small branch but PCI's not a good option. Small nondominant RCA and normal LV systolic function.  Last saw Dr. [name] 03/26/17 still complaining of significant positional chest pain with loud pericardial rub on physical exam and worsening dyspnea on exertion. She was worried about constrictive pericarditis and ordered a cardiac MRI. This was done 04/14/17 and showed a moderate pericardial effusion lateral to the RV free wall and posterior LV myocardium. This was not appreciated on recent echo. There was normal pericardial thickness with mild gadolinium uptake over the RV free wall and apex. There was no evidence of constriction, no evidence of myocarditis normal LVEF 59%.  Patient went to the emergency room 04/14/17 was in A. fib with RVR and spontaneously converted to normal sinus rhythm. Aspirin was stopped this admission. Echocardiogram did not demonstrate pericardial effusion. Because of her recurrent symptoms of pericarditis refractory to colchicine and NSAIDs a steroid taper over 6 weeks was ordered.  06/13/17 - Patient comes in today accompanied by her sister. She is not feeling any better. She still gets chest pain when  laying down are very short distance walking. Her shortness of breath is worsening, now even when walking around the house.  She experiences chest pain daily and steroids provided no relief.  Past Medical History:  Diagnosis Date  . Allergy   . Anemia   . Anemia   . Aortic insufficiency    a. Mod by echo 05/2015.  . Arthritis    "knees" (07/11/2015)  . Atrial fibrillation (HCC)    On Eliquis  . Carcinoma of hilus of lung ([place]) 01/20/2012   IIIB NSCL  Right hilar mass compressing esophagus/presenting with dysphagia Rx Surgery/RT/chemo dx January 2001  . Complication of anesthesia    "I had a hard time waking me up w/one of my shoulder surgeries"  . Coronary artery disease    a. Abnormal stress test -> LHc 06/2015 s/p DES to mCX and mLAD, residual D2 disease treated medially.  . Elevated blood pressure   . Erythema nodosum   . Heart murmur   . Insomnia   . lung ca dx'd 10/1999   chemo/xrt comp 05/29/2000  . Myalgia   . Nephrolithiasis 2015  . Obesity   . Pericardial effusion    a. 05/2015 Echo: EF 55-60%, no rwma, Gr 1 DD, no effusion - but an effusion was seen on CT which was felt to be increased in size compared to 12/2014 - pericardium also thickened.   . Pericarditis    a. Dx 05/2015.  [name] Pneumothorax, spontaneous, tension 01/20/2012   October, 2010  . Pulmonary fibrosis ([place]) 01/20/2012   Due to previous surgery and chest radiation for lung cancer  .  Restrictive lung disease   . Type II diabetes mellitus ([place])     Past Surgical History:  Procedure Laterality Date  . ABDOMINAL HYSTERECTOMY  2008  . APPENDECTOMY  1969?  [name] CARDIAC CATHETERIZATION N/A 07/11/2015   Procedure: Left Heart Cath and Coronary Angiography;  Surgeon: Jettie Booze, MD;  Location: [facility];  Service: Cardiovascular;  Laterality: N/A;  . CARDIAC CATHETERIZATION  07/11/2015   Procedure: Coronary Stent Intervention;  Surgeon: Jettie Booze, MD;  Location: [facility];  Service:  Cardiovascular;;  . CARPAL TUNNEL RELEASE Bilateral 1998-2005?   right-left  . CARPAL TUNNEL RELEASE Left 2009?   "for the 2nd time"  . COLONOSCOPY    . CORONARY ANGIOPLASTY    . CYSTOSCOPY W/ STONE MANIPULATION  2013  . ESOPHAGOGASTRODUODENOSCOPY (EGD) WITH ESOPHAGEAL DILATION  2012  . FOOT SURGERY Bilateral 1980's   Callous removed   . INNER EAR SURGERY Right ~ 2009  . KNEE ARTHROPLASTY Right 1991  . KNEE ARTHROSCOPY  ~ 2000   LATERAL RELEASE  . LEFT HEART CATH AND CORONARY ANGIOGRAPHY N/A 12/18/2016   Procedure: Left Heart Cath and Coronary Angiography;  Surgeon: Burnell Blanks, MD;  Location: [facility];  Service: Cardiovascular;  Laterality: N/A;  . LUNG CANCER SURGERY Left 2001   "small cell"  . LUNG SURGERY Right 2007   "reinflated it"  . PULMONARY EMBOLISM SURGERY  2007   LUNG COLLAPSE  . SHOULDER ADHESION RELEASE Right ~ 2004  . SHOULDER ARTHROSCOPY W/ ROTATOR CUFF REPAIR Right ~ 2003    Current Medications: Current Meds  Medication Sig  . diltiazem (CARDIZEM CD) 240 MG 24 hr capsule Take 1 capsule (240 mg total) by mouth daily.  [name] ELIQUIS 5 MG TABS tablet TAKE 1 TABLET BY MOUTH 2 (TWO) TIMES DAILY.  . furosemide (LASIX) 20 MG tablet Take 1 tablet (20 mg total) by mouth daily.  [name] HUMALOG MIX 75/25 KWIKPEN (75-25) 100 UNIT/ML Kwikpen Inject 25-35 Units into the skin 2 (two) times daily. Inject 35 units subque in Am and 25 units subque in PM  . levothyroxine (SYNTHROID, LEVOTHROID) 50 MCG tablet Take 50 mcg by mouth daily.  [name] losartan (COZAAR) 25 MG tablet Take 1 tablet (25 mg total) by mouth daily.  . meloxicam (MOBIC) 15 MG tablet Take 1 tablet by mouth daily with breakfast.  . metoprolol tartrate (LOPRESSOR) 25 MG tablet Take 1 tablet (25 mg total) by mouth 2 (two) times daily.  . rosuvastatin (CRESTOR) 5 MG tablet Take 1 tablet (5 mg total) by mouth daily.     Allergies:   Hydrocodone; Morphine and related; Lisinopril; and Nsaids   Social History     Social History  . Marital status: Single    Spouse name: N/A  . Number of children: 0  . Years of education: N/A   Occupational History  . RETIRED    Social History Main Topics  . Smoking status: Former Smoker    Packs/day: 0.25    Years: 5.00    Types: Cigarettes    Quit date: 07/22/1999  . Smokeless tobacco: Never Used  . Alcohol use No  . Drug use: No  . Sexual activity: Not Currently    Birth control/ protection: Other-see comments     Comment: hysterectomy   Other Topics Concern  . None   Social History Narrative  . None     Family History:  The patient's family history includes Diabetes in her father, maternal  grandmother, mother, paternal grandmother, sister, sister, sister, and sister; Heart disease in her father; Hypertension in her sister; Kidney disease in her sister; Pancreatic cancer in her mother.   ROS:   Please see the history of present illness.    Review of Systems  Constitution: Negative.  HENT: Negative.   Eyes: Negative.   Cardiovascular: Positive for chest pain, dyspnea on exertion and palpitations.  Respiratory: Negative.   Hematologic/Lymphatic: Negative.   Musculoskeletal: Negative.  Negative for joint pain.  Gastrointestinal: Negative.   Genitourinary: Negative.   Neurological: Negative.    All other systems reviewed and are negative.   PHYSICAL EXAM:   VS:  BP 132/72   Pulse 60   Ht 5' 7.5" (1.715 m)   Wt 267 lb (121.1 kg)   BMI 41.20 kg/m   Physical Exam  GEN: Well nourished, well developed, in no acute distress  Neck: no JVD, carotid bruits, or masses Cardiac:RRR; Loud 3/6 rub in systole and diastole Respiratory:  clear to auscultation bilaterally, normal work of breathing GI: soft, nontender, nondistended, + BS Ext: without cyanosis, clubbing, or edema, Good distal pulses bilaterally Neuro:  Alert and Oriented x 3 Psych: euthymic mood, full affect  Wt Readings from Last 3 Encounters:  06/13/17 267 lb (121.1 kg)   05/06/17 255 lb 3.2 oz (115.8 kg)  04/29/17 276 lb 3.2 oz (125.3 kg)      Studies/Labs Reviewed:   EKG:  EKG is  ordered today.  The ekg ordered today demonstrates Normal sinus rhythm with ST elevation throughout  Recent Labs: 04/12/2017: B Natriuretic Peptide 59.8; TSH 2.776 04/13/2017: ALT 22; BUN 28; Creatinine, Ser 1.01; Hemoglobin 11.3; Platelets 226; Potassium 3.7; Sodium 141   Lipid Panel    Component Value Date/Time   CHOL 104 (L) 08/04/2015 1127   TRIG 74 08/04/2015 1127   HDL 47 08/04/2015 1127   CHOLHDL 2.2 08/04/2015 1127   VLDL 15 08/04/2015 1127   LDLCALC 42 08/04/2015 1127    Additional studies/ records that were reviewed today include:   Cardiac MRI 04/14/2017  1) Moderate pericardial effusion lateral to the RV free wall and posterior LV myocardium. Note this is not appreciated on recent echo   2) Normal pericardial thickness with mild gadolinium uptake over the RV free wall and apex on 3 chamber view   3) No evidence of constriction although MRI especially with no free breathing sequences not ideal to assess Pericardium normal thickness and no ventricular inter dependence   4) Normal LV EF 59% (EDV 105 ESV 43 SV 62 cc)   5) No evidence of myocarditis or LV myocardial uptake of gadolinium on delayed inversion recovery sequences   6) Tri leaflet aortic valve with AR   7) Mild LAE   Images were also reviewed by Dr Loralie Champagne who agreed with above findings   TTE: 04/13/17   Study Conclusions   - Left ventricle: The cavity size was normal. Wall thickness was   normal. Systolic function was normal. The estimated ejection   fraction was in the range of 55% to 60%. - Aortic valve: There was moderate regurgitation. Valve area (VTI):   1.99 cm^2. Valve area (Vmax): 2.04 cm^2. Valve area (Vmean): 1.95   cm^2. - Atrial septum: No defect or patent foramen ovale was identified. _____________ Nuclear stress test: 11/29/2015  Nuclear stress EF:  62%.  There is a small defect of mild severity present in the mid anteroseptal location. The defect is non-reversible and consistent with breast attenuation  artifact vs. prior infarct. No ischemia noted.  This is a low risk study.  The left ventricular ejection fraction is normal (55-65%).  Compared to study of 2016, there is now normal perfusion in the apical anterior and apical myocardium and the defect in the mid anteroseptum is fixed.   TTE: 09/18/2016   - Left ventricle: The cavity size was normal. Wall thickness was   normal. Systolic function was normal. The estimated ejection   fraction was in the range of 55% to 60%. Wall motion was normal;   there were no regional wall motion abnormalities. Doppler   parameters are consistent with abnormal left ventricular   relaxation (grade 1 diastolic dysfunction). - Aortic valve: There was no stenosis. There was mild to moderate   regurgitation. - Mitral valve: There was trivial regurgitation. - Right ventricle: The cavity size was normal. Systolic function   was normal. - Right atrium: The atrium was mildly dilated. - Pulmonic valve: Turbulence across the pulmonic valve but peak   gradient 15 mmHg does not suggest significant stenosis. There was   mild regurgitation. Left atrium:  The atrium was normal in size. - Pulmonary arteries: PA peak pressure: 28 mm Hg (S). - Inferior vena cava: The vessel was normal in size. The   respirophasic diameter changes were in the normal range (>= 50%),   consistent with normal central venous pressure. - Pericardium, extracardiac: Small pericardial effusion, no   tamponade.   Impressions: - Normal LV size with EF 55-60%. Normal RV size and systolic   function. Mild to moderate aortic insufficiency. Turbulence   across the pulmonic valve but no significant stenosis by doppler   measurement.   LHC 11/2016 1. Double vessel CAD, stable.  2. Patent stent in the proximal to mid LAD with no  restenosis 3. Patent stent in the mid Circumflex with no restenosis 4. The Diagonal branch is a small caliber vessel that is jailed by the LAD stent. There is severe disease in this small branch but PCI is not a good option 5. Small non-dominant RCA 6. Normal LV systolic function   Recommendations: Continue medical management of CAD.    ASSESSMENT:    1. Chest pain, unspecified type   2. Constrictive pericarditis   3. Chronic constrictive pericarditis, unspecified type   4. Pre-procedure lab exam    PLAN:  In order of problems listed above:  1. Chronic pericarditis with significant rub in systole and diastole on physical exam, despite prolonged repeated treatment with NSAIDS, Colchicine and steroids.  The patient has worsening symptoms suspicious for constrictive pericarditis. We will schedule a right sided cardiac catheterization and plan for a referral to Dr Norm Parcel - a CT surgeon at [facility] for a potential pericardiectomy.  2. CAD s/p DES to [facility] and mLAD 07/11/15 stable without angina no restenosis on cath in 11/2016  3. Atrial fibrillation with RVR converted to normal sinus rhythm and maintaining normal sinus rhythm on Cardizem 240 mg daily metoprolol 25 mg twice a day. There is question whether she was referred to the A. fib clinic. Will confirm with Dr. [name].  4. Hypertension blood pressure - controlled.  Medication Adjustments/Labs and Tests Ordered: Current medicines are reviewed at length with the patient today.  Concerns regarding medicines are outlined above.  Medication changes, Labs and Tests ordered today are listed in the Patient Instructions below. Patient Instructions  Medication Instructions:   Your physician recommends that you continue on your current medications as directed. Please refer to the  Current Medication list given to you today.    Labwork:  COME TO THE OFFICE ON Friday June 20, 2017 FOR YOUR PRE-CATH LABS--WE WILL CHECK A BMET, CBC W DIFF, AND  PT/INR     Testing/Procedures:  Your physician has requested that you have a cardiac catheterization. Cardiac catheterization is used to diagnose and/or treat various heart conditions. Doctors may recommend this procedure for a number of different reasons. The most common reason is to evaluate chest pain. Chest pain can be a symptom of coronary artery disease (CAD), and cardiac catheterization can show whether plaque is narrowing or blocking your heart's arteries. This procedure is also used to evaluate the valves, as well as measure the blood flow and oxygen levels in different parts of your heart. For further information please visit [facility]. Please follow instruction sheet, as given.  YOUR CARDIAC CATH IS SCHEDULED FOR Wednesday June 25, 2017 AT 6:30 AM AT [place] TO DO.  PLEASE FOLLOW INSTRUCTIONS PROVIDED TO PREPARE YOU FOR YOUR CATH.    Follow-Up:  WILL BE BASED ON YOUR CARDIAC CATH RESULTS   ALSO DR. [name] WILL REFER YOU TO DR. CARMELO A. MILANO AT [facility] BASED ON YOUR CARDIAC CATH REPORT     If you need a refill on your cardiac medications before your next appointment, please call your pharmacy.      Signed, Ena Dawley, MD  06/15/2017 3:52 PM    [facility] [place], [place], [place]  [address] Phone: [phone]; Fax: [phone]

## 2017-06-25 NOTE — Discharge Instructions (Signed)

## 2017-06-27 ENCOUNTER — Telehealth: Payer: Self-pay | Admitting: Cardiology

## 2017-06-27 DIAGNOSIS — I311 Chronic constrictive pericarditis: Secondary | ICD-10-CM

## 2017-06-27 DIAGNOSIS — I319 Disease of pericardium, unspecified: Secondary | ICD-10-CM

## 2017-06-27 NOTE — Telephone Encounter (Signed)
New Message  Pt call requesting to speak with RN about her recent procedure. Please call back to discuss

## 2017-06-27 NOTE — Telephone Encounter (Signed)
Pt calling to follow-up with Dr [name] about her recent cath Dr Burt Knack did on 9/5.  Pt states that Dr Burt Knack was going to get with Dr [name] and come up with a plan for the pt, based on her cath report.  Pt would like for Dr [name] to advise where she goes from here.  Informed the pt that Dr [name] nor Dr Burt Knack are in the office today, but I will route this message to Dr [name] for further and recommendation, upon return to the office.  Informed the pt that I will follow-up with her shortly thereafter.  Pt verbalized understanding and agrees with this plan.

## 2017-06-30 ENCOUNTER — Telehealth: Payer: Self-pay | Admitting: Cardiology

## 2017-06-30 NOTE — Telephone Encounter (Signed)
Pt calling regarding rx for 1 touch meter not at  [facility] when she checked on Saturday-pls advise [phone]

## 2017-06-30 NOTE — Telephone Encounter (Signed)
Pt calling to say she meant to call her PCP office not Dr [name], to request for her Rx for a new one touch meter to be sent into her pharmacy.  Pt apologized for this.

## 2017-07-02 NOTE — Telephone Encounter (Signed)
PT AWARE WILL FORWARD TO DR [name] FOR REVIEW DOES NOT APPEAR HEART CATH RESULTS HAVE BEEN DISCUSSED AT THIS TIME.

## 2017-07-02 NOTE — Telephone Encounter (Signed)
New Message     Pt would like results to the heart cath from last week

## 2017-07-03 NOTE — Telephone Encounter (Signed)
Notified the pt that per Dr [name], based on her cath, she doesn't have constrictive pericarditis, but she would still like to refer her to Dr Druscilla Brownie at [facility], for chronic symptomatic pericarditis.  Informed the pt that I will place the external referral to Dr Despina Pole office into the system, and have one of our [facility] schedulers follow-up on contacting his office to make a new pt consult appt.  Informed the pt that Dr [name] is wanting to refer her to Dr Norm Parcel for possible surgical intervention.  Informed the pt that someone from our scheduling team, or from Dr Despina Pole office, will call her back with an appt, once all documentation is received.  Pt verbalized understanding and agrees with this plan.

## 2017-07-03 NOTE — Telephone Encounter (Signed)
[name],  Please help her schedule an appointment with Dr Druscilla Brownie in [place] [phone]. She doesn't have a constriction on the cardiac cath, but has chronic symptomatic pericarditis and might need a surgical intervention, that's why I am sending her to Dr Norm Parcel. Thank you, KN

## 2017-07-10 ENCOUNTER — Telehealth: Payer: Self-pay | Admitting: Cardiology

## 2017-07-10 MED ORDER — APIXABAN 5 MG PO TABS: ORAL_TABLET | ORAL | 1 refills | Status: DC

## 2017-07-10 NOTE — Telephone Encounter (Signed)
Requested a copy of pts cath report to be burned on a CD from [place] in the cath lab on 9/19.  [name] to interoffice the CD.

## 2017-07-10 NOTE — Telephone Encounter (Signed)
Message   Spoke with Butch Penny with Dr. Norm Parcel office. She asked that I faxed records to [phone]. She will call patient to set up the appointment.  Patient is to bring a copy of her last echo done on 04-13-17 [name] is making a copy of that and will bring to me) and Cath done 06-25-17. Spoke with Mendel Ryder who is the cardmaster today at the hospital. She will have Cath Lab made me a copy of the cd and bring or send it to the office.  I have contacted the patient and she is aware of the referral and that she will need to pick up the cds from the office to take with her to the appointment once it is made.  [name] informed me that she will be out of town from 07-19-17 to 07-26-17.  I will make sure Dr. Norm Parcel office is aware of this.

## 2017-07-10 NOTE — Telephone Encounter (Signed)
New Message   *STAT* If patient is at the pharmacy, call can be transferred to refill team.   1. Which medications need to be refilled? (please list name of each medication and dose if known) Eliquis 5mg    2. Which pharmacy/location (including street and city if local pharmacy) is medication to be sent to? [facility]   3. Do they need a 30 day or 90 day supply? [place]

## 2017-07-10 NOTE — Telephone Encounter (Signed)
Refill sent in

## 2017-07-15 ENCOUNTER — Telehealth: Payer: Self-pay | Admitting: Cardiology

## 2017-07-15 NOTE — Telephone Encounter (Signed)
Mrs. Manfre is calling to find out if the referral has been made to [facility] . Please call

## 2017-07-15 NOTE — Telephone Encounter (Signed)
Pts cath report on a CD was received today by our cath lab. Will hand this off to [facility] scheduler Ivin Booty, so referral to [facility] can be completed.

## 2017-07-15 NOTE — Telephone Encounter (Signed)
Will route this message to Mack Guise in scheduling to call the pt about referral to [facility] to see Dr Norm Parcel.  Cath report CD was received this morning from our cath lab.  This was given to Ivin Booty, who made plans to call the pt today to complete her referral.  Pt is to come into the office and see Ivin Booty to obtain her medical records (cath report) that was burned on a CD.  This is to take to her appt with Dr Norm Parcel. Spoke with Ivin Booty in [facility], and she will call the pt now.

## 2017-07-22 NOTE — Telephone Encounter (Signed)
Pt is scheduled with Dr. Norm Parcel at [facility], for 07/23/17.  Pt made aware of appt date and time by [facility] scheduler.

## 2017-07-30 ENCOUNTER — Telehealth: Payer: Self-pay | Admitting: *Deleted

## 2017-07-30 MED ORDER — FUROSEMIDE 40 MG PO TABS: 40.0000 mg | ORAL_TABLET | Freq: Every day | ORAL | 3 refills | Status: DC

## 2017-07-30 MED ORDER — SPIRONOLACTONE 25 MG PO TABS: 12.5000 mg | ORAL_TABLET | Freq: Every day | ORAL | 3 refills | Status: DC

## 2017-07-30 NOTE — Telephone Encounter (Signed)
Notified the pt that per Dr [name], she recommends that we increase her lasix to 40 mg po daily, and add spironolactone 12.5 mg po daily.  Also informed the pt that Dr [name] would like to see her in the next week or so.  Pt requested to come and see Dr [name] on next Monday 10/15, if possible.  Scheduled the pt an appt to see Dr [name] for next Monday 10/15 at 1:40 pm.  Advised the pt to arrive 15 mins prior to this appt.  Confirmed the pharmacy of choice with the pt.  Pt verbalized understanding and agrees with this plan.

## 2017-07-30 NOTE — Telephone Encounter (Signed)
-----   Message from Dorothy Spark, MD sent at 07/30/2017  5:52 PM EDT ----- Please increase lasix to 40 mg po daily and start spironolactone 12.5 mg po daily. Please add her to my clinic sometimes in the next few weeks.   Thank you, KN

## 2017-08-04 ENCOUNTER — Encounter: Payer: Self-pay | Admitting: Cardiology

## 2017-08-04 ENCOUNTER — Ambulatory Visit (INDEPENDENT_AMBULATORY_CARE_PROVIDER_SITE_OTHER): Payer: Medicare Other | Admitting: Cardiology

## 2017-08-04 VITALS — BP 126/72 | HR 87 | Ht 67.5 in | Wt 259.0 lb

## 2017-08-04 DIAGNOSIS — I5033 Acute on chronic diastolic (congestive) heart failure: Secondary | ICD-10-CM

## 2017-08-04 DIAGNOSIS — Z9861 Coronary angioplasty status: Secondary | ICD-10-CM

## 2017-08-04 DIAGNOSIS — I319 Disease of pericardium, unspecified: Secondary | ICD-10-CM

## 2017-08-04 DIAGNOSIS — I4891 Unspecified atrial fibrillation: Secondary | ICD-10-CM

## 2017-08-04 DIAGNOSIS — I2510 Atherosclerotic heart disease of native coronary artery without angina pectoris: Secondary | ICD-10-CM | POA: Diagnosis not present

## 2017-08-04 DIAGNOSIS — I10 Essential (primary) hypertension: Secondary | ICD-10-CM | POA: Diagnosis not present

## 2017-08-04 DIAGNOSIS — I5032 Chronic diastolic (congestive) heart failure: Secondary | ICD-10-CM | POA: Insufficient documentation

## 2017-08-04 MED ORDER — POTASSIUM CHLORIDE CRYS ER 20 MEQ PO TBCR: 20.0000 meq | EXTENDED_RELEASE_TABLET | Freq: Every day | ORAL | 1 refills | Status: DC

## 2017-08-04 MED ORDER — FUROSEMIDE 40 MG PO TABS: 40.0000 mg | ORAL_TABLET | Freq: Two times a day (BID) | ORAL | 1 refills | Status: DC

## 2017-08-04 NOTE — Patient Instructions (Signed)
Medication Instructions:   INCREASE YOUR LASIX TO 40 MG TWICE DAILY---TAKE YOUR 1ST DOSE AT 6 AM AND YOUR 2ND DOSE AT 12 PM.   START TAKING POTASSIUM CHLORIDE 20 mEq ONCE DAILY     Labwork:  ON 08/21/17--SAME DAY AS YOU SEE DR [name] TO CHECK--BMET AND PRO-BNP      Follow-Up:  August 21, 2017 AT 3:20 PM WITH DR NELSON--YOU WILL HAVE LAB DONE SAME DAY       If you need a refill on your cardiac medications before your next appointment, please call your pharmacy.

## 2017-08-04 NOTE — Progress Notes (Signed)
Cardiology Office Note    Date:  08/04/2017   ID:  [name], DOB 1956-01-08, MRN [id number]  PCP:  Patient, No Pcp Per  Cardiologist: Dr. [name]  Chief complain: chest pain  History of Present Illness:  [name] is a 61 y.o. female with a history of CAD s/p DES to [facility] and mLAD 07/11/15, chronic pericardial effusion, DM, HTN, remote h/o treated non-small cell lung cancer, pulmonary fibrosis, aortic insufficiency, COPD, anemia and mod AI by echo 05/2015. PAF converted to NSR 08/2016 on metoprolol and eliquis for CHADSVASC=4.  Acute pericarditis 11/2016 treated with colchicine and ibuprofen. Cardiac cath 11/2016 patent stent in the proximal to mid LAD, patent stent in the mid circumflex diagonal branch small caliber vessel and jailed by the LAD stent. Severe disease in the small branch but PCI's not a good option. Small nondominant RCA and normal LV systolic function.  Last saw Dr. [name] 03/26/17 still complaining of significant positional chest pain with loud pericardial rub on physical exam and worsening dyspnea on exertion. She was worried about constrictive pericarditis and ordered a cardiac MRI. This was done 04/14/17 and showed a moderate pericardial effusion lateral to the RV free wall and posterior LV myocardium. This was not appreciated on recent echo. There was normal pericardial thickness with mild gadolinium uptake over the RV free wall and apex. There was no evidence of constriction, no evidence of myocarditis normal LVEF 59%.  Patient went to the emergency room 04/14/17 was in A. fib with RVR and spontaneously converted to normal sinus rhythm. Aspirin was stopped this admission. Echocardiogram did not demonstrate pericardial effusion. Because of her recurrent symptoms of pericarditis refractory to colchicine and NSAIDs a steroid taper over 6 weeks was ordered.  06/13/17 - Patient comes in today accompanied by her sister. She is not feeling any better. She still gets chest pain  when laying down are very short distance walking. Her shortness of breath is worsening, now even when walking around the house.  She experiences chest pain daily and steroids provided no relief.  08/04/2017 the patient underwent right cardiac catheterization in the hospital that showed elevated LVEDP consistent with volume overload and no evidence of ventricular inter dependence. She was referred for consultation to Dr. Norm Parcel at [facility] who is a transplant surgeon and performs pericardiectomy is, he repeated right-sided cardiac catheterization with same results as in [facility]. She continues to have same symptoms, two-pillow orthopnea and exertional dyspnea and chest tightness.  Past Medical History:  Diagnosis Date  . Allergy   . Anemia   . Anemia   . Aortic insufficiency    a. Mod by echo 05/2015.  . Arthritis    "knees" (07/11/2015)  . Atrial fibrillation (HCC)    On Eliquis  . Carcinoma of hilus of lung ([place]) 01/20/2012   IIIB NSCL  Right hilar mass compressing esophagus/presenting with dysphagia Rx Surgery/RT/chemo dx January 2001  . Complication of anesthesia    "I had a hard time waking me up w/one of my shoulder surgeries"  . Coronary artery disease    a. Abnormal stress test -> LHc 06/2015 s/p DES to mCX and mLAD, residual D2 disease treated medially.  . Elevated blood pressure   . Erythema nodosum   . Heart murmur   . Insomnia   . lung ca dx'd 10/1999   chemo/xrt comp 05/29/2000  . Myalgia   . Nephrolithiasis 2015  . Obesity   . Pericardial effusion    a. 05/2015  Echo: EF 55-60%, no rwma, Gr 1 DD, no effusion - but an effusion was seen on CT which was felt to be increased in size compared to 12/2014 - pericardium also thickened.   . Pericarditis    a. Dx 05/2015.  [name] Pneumothorax, spontaneous, tension 01/20/2012   October, 2010  . Pulmonary fibrosis ([place]) 01/20/2012   Due to previous surgery and chest radiation for lung cancer  . Restrictive lung disease   . Type II  diabetes mellitus ([place])     Past Surgical History:  Procedure Laterality Date  . ABDOMINAL HYSTERECTOMY  2008  . APPENDECTOMY  1969?  [name] CARDIAC CATHETERIZATION N/A 07/11/2015   Procedure: Left Heart Cath and Coronary Angiography;  Surgeon: Jettie Booze, MD;  Location: [place] [facility];  Service: Cardiovascular;  Laterality: N/A;  . CARDIAC CATHETERIZATION  07/11/2015   Procedure: Coronary Stent Intervention;  Surgeon: Jettie Booze, MD;  Location: [place] [facility];  Service: Cardiovascular;;  . CARPAL TUNNEL RELEASE Bilateral 1998-2005?   right-left  . CARPAL TUNNEL RELEASE Left 2009?   "for the 2nd time"  . COLONOSCOPY    . CORONARY ANGIOPLASTY    . CYSTOSCOPY W/ STONE MANIPULATION  2013  . ESOPHAGOGASTRODUODENOSCOPY (EGD) WITH ESOPHAGEAL DILATION  2012  . FOOT SURGERY Bilateral 1980's   Callous removed   . INNER EAR SURGERY Right ~ 2009  . KNEE ARTHROPLASTY Right 1991  . KNEE ARTHROSCOPY  ~ 2000   LATERAL RELEASE  . LEFT HEART CATH AND CORONARY ANGIOGRAPHY N/A 12/18/2016   Procedure: Left Heart Cath and Coronary Angiography;  Surgeon: Burnell Blanks, MD;  Location: [place] [facility];  Service: Cardiovascular;  Laterality: N/A;  . LUNG CANCER SURGERY Left 2001   "small cell"  . LUNG SURGERY Right 2007   "reinflated it"  . PULMONARY EMBOLISM SURGERY  2007   LUNG COLLAPSE  . RIGHT AND LEFT HEART CATH N/A 06/25/2017   Procedure: RIGHT AND LEFT HEART CATH;  Surgeon: Sherren Mocha, MD;  Location: [place] [facility];  Service: Cardiovascular;  Laterality: N/A;  . SHOULDER ADHESION RELEASE Right ~ 2004  . SHOULDER ARTHROSCOPY W/ ROTATOR CUFF REPAIR Right ~ 2003    Current Medications: Current Meds  Medication Sig  . apixaban (ELIQUIS) 5 MG TABS tablet TAKE 1 TABLET BY MOUTH 2 (TWO) TIMES DAILY.  [name] diltiazem (CARDIZEM CD) 240 MG 24 hr capsule Take 1 capsule (240 mg total) by mouth daily.  [name] HUMALOG MIX 75/25 KWIKPEN (75-25) 100 UNIT/ML Kwikpen Inject into  the skin 2 (two) times daily. Inject 60 units sq in Am and 25 units subque in PM  . levothyroxine (SYNTHROID, LEVOTHROID) 50 MCG tablet Take 50 mcg by mouth daily.  [name] losartan (COZAAR) 25 MG tablet Take 1 tablet (25 mg total) by mouth daily.  . meloxicam (MOBIC) 15 MG tablet Take 1 tablet by mouth daily with breakfast.  . metoprolol tartrate (LOPRESSOR) 25 MG tablet Take 1 tablet (25 mg total) by mouth 2 (two) times daily.  . rosuvastatin (CRESTOR) 5 MG tablet Take 1 tablet (5 mg total) by mouth daily.  [name] spironolactone (ALDACTONE) 25 MG tablet Take 0.5 tablets (12.5 mg total) by mouth daily.  . [DISCONTINUED] furosemide (LASIX) 40 MG tablet Take 1 tablet (40 mg total) by mouth daily.     Allergies:   Hydrocodone; Morphine and related; Lisinopril; and Nsaids   Social History   Social History  . Marital status: Single    Spouse name: N/A  .  Number of children: 0  . Years of education: N/A   Occupational History  . RETIRED    Social History Main Topics  . Smoking status: Former Smoker    Packs/day: 0.25    Years: 5.00    Types: Cigarettes    Quit date: 07/22/1999  . Smokeless tobacco: Never Used  . Alcohol use No  . Drug use: No  . Sexual activity: Not Currently    Birth control/ protection: Other-see comments     Comment: hysterectomy   Other Topics Concern  . None   Social History Narrative  . None     Family History:  The patient's family history includes Diabetes in her father, maternal grandmother, mother, paternal grandmother, sister, sister, sister, and sister; Heart disease in her father; Hypertension in her sister; Kidney disease in her sister; Pancreatic cancer in her mother.   ROS:   Please see the history of present illness.    Review of Systems  Constitution: Negative.  HENT: Negative.   Eyes: Negative.   Cardiovascular: Positive for chest pain, dyspnea on exertion and palpitations.  Respiratory: Negative.   Hematologic/Lymphatic: Negative.     Musculoskeletal: Negative.  Negative for joint pain.  Gastrointestinal: Negative.   Genitourinary: Negative.   Neurological: Negative.    All other systems reviewed and are negative.   PHYSICAL EXAM:   VS:  BP 126/72   Pulse 87   Ht 5' 7.5" (1.715 m)   Wt 259 lb (117.5 kg)   SpO2 96%   BMI 39.97 kg/m   Physical Exam  GEN: Well nourished, well developed, in no acute distress  Neck: no JVD, carotid bruits, or masses Cardiac:RRR; Loud 3/6 rub in systole and diastole Respiratory:  clear to auscultation bilaterally, normal work of breathing GI: soft, nontender, nondistended, + BS Ext: without cyanosis, clubbing, or edema, Good distal pulses bilaterally Neuro:  Alert and Oriented x 3 Psych: euthymic mood, full affect  Wt Readings from Last 3 Encounters:  08/04/17 259 lb (117.5 kg)  06/25/17 262 lb (118.8 kg)  06/13/17 267 lb (121.1 kg)      Studies/Labs Reviewed:   EKG:  EKG is  ordered today.  The ekg ordered today demonstrates Normal sinus rhythm with ST elevation throughout  Recent Labs: 04/12/2017: B Natriuretic Peptide 59.8; TSH 2.776 04/13/2017: ALT 22 06/20/2017: BUN 20; Creatinine, Ser 0.89; Hemoglobin 11.5; Platelets 368; Potassium 3.8; Sodium 145   Lipid Panel    Component Value Date/Time   CHOL 104 (L) 08/04/2015 1127   TRIG 74 08/04/2015 1127   HDL 47 08/04/2015 1127   CHOLHDL 2.2 08/04/2015 1127   VLDL 15 08/04/2015 1127   LDLCALC 42 08/04/2015 1127    Additional studies/ records that were reviewed today include:   Cardiac MRI 04/14/2017  1) Moderate pericardial effusion lateral to the RV free wall and posterior LV myocardium. Note this is not appreciated on recent echo   2) Normal pericardial thickness with mild gadolinium uptake over the RV free wall and apex on 3 chamber view   3) No evidence of constriction although MRI especially with no free breathing sequences not ideal to assess Pericardium normal thickness and no ventricular inter  dependence   4) Normal LV EF 59% (EDV 105 ESV 43 SV 62 cc)   5) No evidence of myocarditis or LV myocardial uptake of gadolinium on delayed inversion recovery sequences   6) Tri leaflet aortic valve with AR   7) Mild LAE   Images were also reviewed  by Dr Loralie Champagne who agreed with above findings   TTE: 04/13/17   Study Conclusions   - Left ventricle: The cavity size was normal. Wall thickness was   normal. Systolic function was normal. The estimated ejection   fraction was in the range of 55% to 60%. - Aortic valve: There was moderate regurgitation. Valve area (VTI):   1.99 cm^2. Valve area (Vmax): 2.04 cm^2. Valve area (Vmean): 1.95   cm^2. - Atrial septum: No defect or patent foramen ovale was identified. _____________ Nuclear stress test: 11/29/2015  Nuclear stress EF: 62%.  There is a small defect of mild severity present in the mid anteroseptal location. The defect is non-reversible and consistent with breast attenuation artifact vs. prior infarct. No ischemia noted.  This is a low risk study.  The left ventricular ejection fraction is normal (55-65%).  Compared to study of 2016, there is now normal perfusion in the apical anterior and apical myocardium and the defect in the mid anteroseptum is fixed.   TTE: 09/18/2016   - Left ventricle: The cavity size was normal. Wall thickness was   normal. Systolic function was normal. The estimated ejection   fraction was in the range of 55% to 60%. Wall motion was normal;   there were no regional wall motion abnormalities. Doppler   parameters are consistent with abnormal left ventricular   relaxation (grade 1 diastolic dysfunction). - Aortic valve: There was no stenosis. There was mild to moderate   regurgitation. - Mitral valve: There was trivial regurgitation. - Right ventricle: The cavity size was normal. Systolic function   was normal. - Right atrium: The atrium was mildly dilated. - Pulmonic valve: Turbulence  across the pulmonic valve but peak   gradient 15 mmHg does not suggest significant stenosis. There was   mild regurgitation. Left atrium:  The atrium was normal in size. - Pulmonary arteries: PA peak pressure: 28 mm Hg (S). - Inferior vena cava: The vessel was normal in size. The   respirophasic diameter changes were in the normal range (>= 50%),   consistent with normal central venous pressure. - Pericardium, extracardiac: Small pericardial effusion, no   tamponade.   Impressions: - Normal LV size with EF 55-60%. Normal RV size and systolic   function. Mild to moderate aortic insufficiency. Turbulence   across the pulmonic valve but no significant stenosis by doppler   measurement.   LHC 11/2016 1. Double vessel CAD, stable.  2. Patent stent in the proximal to mid LAD with no restenosis 3. Patent stent in the mid Circumflex with no restenosis 4. The Diagonal branch is a small caliber vessel that is jailed by the LAD stent. There is severe disease in this small branch but PCI is not a good option 5. Small non-dominant RCA 6. Normal LV systolic function   Recommendations: Continue medical management of CAD.    ASSESSMENT:    1. Acute on chronic diastolic CHF (congestive heart failure), NYHA class 3 ([place])   2. Acute on chronic diastolic CHF (congestive heart failure) ([place])   3. Chronic pericarditis, unspecified complication status, unspecified type   4. CAD S/P percutaneous coronary angioplasty   5. Atrial fibrillation with RVR ([place])   6. Hypertension, essential    PLAN:  In order of problems listed above:  1. Acute on chronic diastolic CHF - we will increase Lasix to 40 mg by mouth twice a day and add spironolactone 12.5 mg daily. We will follow in 2 weeks with checking BMP and  BNP at the time. She was also provided some dietary direction, she seems to be out a lot doesn't wash herself, doesn't eat any fresh vegetables as she claims that it upsets her stomach.  2. Chronic  pericarditis with significant rub in systole and diastole on physical exam, despite prolonged repeated treatment with NSAIDS, Colchicine and steroids.  The patient has worsening symptoms suspicious for constrictive pericarditis, however right-sided cath in [facility] and [facility] was negative. This was discussed with Dr. Norm Parcel, located 3 months of intensive heart failure management and if no improvement he will follow with her.  3. CAD s/p DES to [facility] and mLAD 07/11/15 stable without angina no restenosis on cath in 11/2016  4. Atrial fibrillation with RVR converted to normal sinus rhythm and maintaining normal sinus rhythm on Cardizem 240 mg daily metoprolol 25 mg twice a day. There is question whether she was referred to the A. fib clinic. Will confirm with Dr. [name].  5. Hypertension blood pressure - controlled.  Medication Adjustments/Labs and Tests Ordered: Current medicines are reviewed at length with the patient today.  Concerns regarding medicines are outlined above.  Medication changes, Labs and Tests ordered today are listed in the Patient Instructions below. There are no Patient Instructions on file for this visit.   Signed, Ena Dawley, MD  08/04/2017 2:35 PM    [facility] [place], [place], [place]  [address] Phone: [phone]; Fax: [phone]

## 2017-08-19 ENCOUNTER — Encounter: Payer: Self-pay | Admitting: Cardiology

## 2017-08-19 ENCOUNTER — Encounter (INDEPENDENT_AMBULATORY_CARE_PROVIDER_SITE_OTHER): Payer: Self-pay

## 2017-08-19 ENCOUNTER — Ambulatory Visit (INDEPENDENT_AMBULATORY_CARE_PROVIDER_SITE_OTHER): Payer: Medicare Other | Admitting: Cardiology

## 2017-08-19 ENCOUNTER — Other Ambulatory Visit: Payer: Medicare Other | Admitting: *Deleted

## 2017-08-19 VITALS — BP 126/62 | HR 68 | Ht 67.5 in | Wt 250.0 lb

## 2017-08-19 DIAGNOSIS — I10 Essential (primary) hypertension: Secondary | ICD-10-CM

## 2017-08-19 DIAGNOSIS — I319 Disease of pericardium, unspecified: Secondary | ICD-10-CM

## 2017-08-19 DIAGNOSIS — I2510 Atherosclerotic heart disease of native coronary artery without angina pectoris: Secondary | ICD-10-CM | POA: Diagnosis not present

## 2017-08-19 DIAGNOSIS — I5033 Acute on chronic diastolic (congestive) heart failure: Secondary | ICD-10-CM

## 2017-08-19 DIAGNOSIS — I4891 Unspecified atrial fibrillation: Secondary | ICD-10-CM

## 2017-08-19 DIAGNOSIS — Z9861 Coronary angioplasty status: Secondary | ICD-10-CM

## 2017-08-19 NOTE — Progress Notes (Signed)
Cardiology Office Note    Date:  08/19/2017   ID:  [name], DOB 15-Feb-1956, MRN [id number]  PCP:  Patient, No Pcp Per  Cardiologist: Dr. [name]  Chief complain: chest pain  History of Present Illness:  [name] is a 61 y.o. female with a history of CAD s/p DES to [facility] and mLAD 07/11/15, chronic pericardial effusion, DM, HTN, remote h/o treated non-small cell lung cancer, pulmonary fibrosis, aortic insufficiency, COPD, anemia and mod AI by echo 05/2015. PAF converted to NSR 08/2016 on metoprolol and eliquis for CHADSVASC=4.  Acute pericarditis 11/2016 treated with colchicine and ibuprofen. Cardiac cath 11/2016 patent stent in the proximal to mid LAD, patent stent in the mid circumflex diagonal branch small caliber vessel and jailed by the LAD stent. Severe disease in the small branch but PCI's not a good option. Small nondominant RCA and normal LV systolic function.  Last saw Dr. [name] 03/26/17 still complaining of significant positional chest pain with loud pericardial rub on physical exam and worsening dyspnea on exertion. She was worried about constrictive pericarditis and ordered a cardiac MRI. This was done 04/14/17 and showed a moderate pericardial effusion lateral to the RV free wall and posterior LV myocardium. This was not appreciated on recent echo. There was normal pericardial thickness with mild gadolinium uptake over the RV free wall and apex. There was no evidence of constriction, no evidence of myocarditis normal LVEF 59%.  Patient went to the emergency room 04/14/17 was in A. fib with RVR and spontaneously converted to normal sinus rhythm. Aspirin was stopped this admission. Echocardiogram did not demonstrate pericardial effusion. Because of her recurrent symptoms of pericarditis refractory to colchicine and NSAIDs a steroid taper over 6 weeks was ordered.  06/13/17 - Patient comes in today accompanied by her sister. She is not feeling any better. She still gets chest pain  when laying down are very short distance walking. Her shortness of breath is worsening, now even when walking around the house.  She experiences chest pain daily and steroids provided no relief.  08/04/2017 the patient underwent right cardiac catheterization in the hospital that showed elevated LVEDP consistent with volume overload and no evidence of ventricular inter dependence. She was referred for consultation to Dr. Norm Parcel at [facility] who is a transplant surgeon and performs pericardiectomy is, he repeated right-sided cardiac catheterization with same results as in [facility]. She continues to have same symptoms, two-pillow orthopnea and exertional dyspnea and chest tightness.  08/19/2017 - 2 weeks follow up, she feels significantly better with increased dose of lasix and adding spironolactone, LE edema has resolved, orthopnea and PND has resolved and chest pain has improved. She just underwent lab work today before the clinic. She is watching her diet more closely.  Past Medical History:  Diagnosis Date  . Allergy   . Anemia   . Anemia   . Aortic insufficiency    a. Mod by echo 05/2015.  . Arthritis    "knees" (07/11/2015)  . Atrial fibrillation (HCC)    On Eliquis  . Carcinoma of hilus of lung ([place]) 01/20/2012   IIIB NSCL  Right hilar mass compressing esophagus/presenting with dysphagia Rx Surgery/RT/chemo dx January 2001  . Complication of anesthesia    "I had a hard time waking me up w/one of my shoulder surgeries"  . Coronary artery disease    a. Abnormal stress test -> LHc 06/2015 s/p DES to mCX and mLAD, residual D2 disease treated medially.  . Elevated  blood pressure   . Erythema nodosum   . Heart murmur   . Insomnia   . lung ca dx'd 10/1999   chemo/xrt comp 05/29/2000  . Myalgia   . Nephrolithiasis 2015  . Obesity   . Pericardial effusion    a. 05/2015 Echo: EF 55-60%, no rwma, Gr 1 DD, no effusion - but an effusion was seen on CT which was felt to be increased in size  compared to 12/2014 - pericardium also thickened.   . Pericarditis    a. Dx 05/2015.  [name] Pneumothorax, spontaneous, tension 01/20/2012   October, 2010  . Pulmonary fibrosis ([place]) 01/20/2012   Due to previous surgery and chest radiation for lung cancer  . Restrictive lung disease   . Type II diabetes mellitus ([place])     Past Surgical History:  Procedure Laterality Date  . ABDOMINAL HYSTERECTOMY  2008  . APPENDECTOMY  1969?  [name] CARDIAC CATHETERIZATION N/A 07/11/2015   Procedure: Left Heart Cath and Coronary Angiography;  Surgeon: Jettie Booze, MD;  Location: [facility];  Service: Cardiovascular;  Laterality: N/A;  . CARDIAC CATHETERIZATION  07/11/2015   Procedure: Coronary Stent Intervention;  Surgeon: Jettie Booze, MD;  Location: [facility];  Service: Cardiovascular;;  . CARPAL TUNNEL RELEASE Bilateral 1998-2005?   right-left  . CARPAL TUNNEL RELEASE Left 2009?   "for the 2nd time"  . COLONOSCOPY    . CORONARY ANGIOPLASTY    . CYSTOSCOPY W/ STONE MANIPULATION  2013  . ESOPHAGOGASTRODUODENOSCOPY (EGD) WITH ESOPHAGEAL DILATION  2012  . FOOT SURGERY Bilateral 1980's   Callous removed   . INNER EAR SURGERY Right ~ 2009  . KNEE ARTHROPLASTY Right 1991  . KNEE ARTHROSCOPY  ~ 2000   LATERAL RELEASE  . LEFT HEART CATH AND CORONARY ANGIOGRAPHY N/A 12/18/2016   Procedure: Left Heart Cath and Coronary Angiography;  Surgeon: Burnell Blanks, MD;  Location: [facility];  Service: Cardiovascular;  Laterality: N/A;  . LUNG CANCER SURGERY Left 2001   "small cell"  . LUNG SURGERY Right 2007   "reinflated it"  . PULMONARY EMBOLISM SURGERY  2007   LUNG COLLAPSE  . RIGHT AND LEFT HEART CATH N/A 06/25/2017   Procedure: RIGHT AND LEFT HEART CATH;  Surgeon: Sherren Mocha, MD;  Location: [facility];  Service: Cardiovascular;  Laterality: N/A;  . SHOULDER ADHESION RELEASE Right ~ 2004  . SHOULDER ARTHROSCOPY W/ ROTATOR CUFF REPAIR Right ~ 2003    Current  Medications: Current Meds  Medication Sig  . apixaban (ELIQUIS) 5 MG TABS tablet TAKE 1 TABLET BY MOUTH 2 (TWO) TIMES DAILY.  [name] diltiazem (CARDIZEM CD) 240 MG 24 hr capsule Take 1 capsule (240 mg total) by mouth daily.  . furosemide (LASIX) 40 MG tablet Take 1 tablet (40 mg total) by mouth 2 (two) times daily. Take 1st dose at 6 am and 2nd dose at 12 pm.  . HUMALOG MIX 75/25 KWIKPEN (75-25) 100 UNIT/ML Kwikpen Inject into the skin 2 (two) times daily. Inject 60 units sq in Am and 25 units subque in PM  . levothyroxine (SYNTHROID, LEVOTHROID) 50 MCG tablet Take 50 mcg by mouth daily.  [name] losartan (COZAAR) 25 MG tablet Take 1 tablet (25 mg total) by mouth daily.  . meloxicam (MOBIC) 15 MG tablet Take 1 tablet by mouth daily with breakfast.  . metoprolol tartrate (LOPRESSOR) 25 MG tablet Take 1 tablet (25 mg total) by mouth 2 (two) times daily.  [name]  potassium chloride SA (K-DUR,KLOR-CON) 20 MEQ tablet Take 1 tablet (20 mEq total) by mouth daily.  . rosuvastatin (CRESTOR) 5 MG tablet Take 1 tablet (5 mg total) by mouth daily.  [name] spironolactone (ALDACTONE) 25 MG tablet Take 0.5 tablets (12.5 mg total) by mouth daily.     Allergies:   Hydrocodone; Morphine and related; Lisinopril; and Nsaids   Social History   Social History  . Marital status: Single    Spouse name: N/A  . Number of children: 0  . Years of education: N/A   Occupational History  . RETIRED    Social History Main Topics  . Smoking status: Former Smoker    Packs/day: 0.25    Years: 5.00    Types: Cigarettes    Quit date: 07/22/1999  . Smokeless tobacco: Never Used  . Alcohol use No  . Drug use: No  . Sexual activity: Not Currently    Birth control/ protection: Other-see comments     Comment: hysterectomy   Other Topics Concern  . None   Social History Narrative  . None     Family History:  The patient's family history includes Diabetes in her father, maternal grandmother, mother, paternal grandmother, sister,  sister, sister, and sister; Heart disease in her father; Hypertension in her sister; Kidney disease in her sister; Pancreatic cancer in her mother.   ROS:   Please see the history of present illness.    Review of Systems  Constitution: Negative.  HENT: Negative.   Eyes: Negative.   Cardiovascular: Positive for chest pain, dyspnea on exertion and palpitations.  Respiratory: Negative.   Hematologic/Lymphatic: Negative.   Musculoskeletal: Negative.  Negative for joint pain.  Gastrointestinal: Negative.   Genitourinary: Negative.   Neurological: Negative.    All other systems reviewed and are negative.   PHYSICAL EXAM:   VS:  BP 126/62   Pulse 68   Ht 5' 7.5" (1.715 m)   Wt 250 lb (113.4 kg)   BMI 38.58 kg/m   Physical Exam  GEN: Well nourished, well developed, in no acute distress  Neck: no JVD, carotid bruits, or masses Cardiac:RRR; Loud 3/6 rub in systole and diastole Respiratory:  clear to auscultation bilaterally, normal work of breathing GI: soft, nontender, nondistended, + BS Ext: without cyanosis, clubbing, or edema, Good distal pulses bilaterally Neuro:  Alert and Oriented x 3 Psych: euthymic mood, full affect  Wt Readings from Last 3 Encounters:  08/19/17 250 lb (113.4 kg)  08/04/17 259 lb (117.5 kg)  06/25/17 262 lb (118.8 kg)      Studies/Labs Reviewed:   EKG:  EKG is  ordered today.  The ekg ordered today demonstrates Normal sinus rhythm with ST elevation throughout  Recent Labs: 04/12/2017: B Natriuretic Peptide 59.8; TSH 2.776 04/13/2017: ALT 22 06/20/2017: BUN 20; Creatinine, Ser 0.89; Hemoglobin 11.5; Platelets 368; Potassium 3.8; Sodium 145   Lipid Panel    Component Value Date/Time   CHOL 104 (L) 08/04/2015 1127   TRIG 74 08/04/2015 1127   HDL 47 08/04/2015 1127   CHOLHDL 2.2 08/04/2015 1127   VLDL 15 08/04/2015 1127   LDLCALC 42 08/04/2015 1127    Additional studies/ records that were reviewed today include:   Cardiac MRI 04/14/2017  1)  Moderate pericardial effusion lateral to the RV free wall and posterior LV myocardium. Note this is not appreciated on recent echo   2) Normal pericardial thickness with mild gadolinium uptake over the RV free wall and apex on 3 chamber view  3) No evidence of constriction although MRI especially with no free breathing sequences not ideal to assess Pericardium normal thickness and no ventricular inter dependence   4) Normal LV EF 59% (EDV 105 ESV 43 SV 62 cc)   5) No evidence of myocarditis or LV myocardial uptake of gadolinium on delayed inversion recovery sequences   6) Tri leaflet aortic valve with AR   7) Mild LAE   Images were also reviewed by Dr Loralie Champagne who agreed with above findings   TTE: 04/13/17   Study Conclusions   - Left ventricle: The cavity size was normal. Wall thickness was   normal. Systolic function was normal. The estimated ejection   fraction was in the range of 55% to 60%. - Aortic valve: There was moderate regurgitation. Valve area (VTI):   1.99 cm^2. Valve area (Vmax): 2.04 cm^2. Valve area (Vmean): 1.95   cm^2. - Atrial septum: No defect or patent foramen ovale was identified. _____________ Nuclear stress test: 11/29/2015  Nuclear stress EF: 62%.  There is a small defect of mild severity present in the mid anteroseptal location. The defect is non-reversible and consistent with breast attenuation artifact vs. prior infarct. No ischemia noted.  This is a low risk study.  The left ventricular ejection fraction is normal (55-65%).  Compared to study of 2016, there is now normal perfusion in the apical anterior and apical myocardium and the defect in the mid anteroseptum is fixed.   TTE: 09/18/2016   - Left ventricle: The cavity size was normal. Wall thickness was   normal. Systolic function was normal. The estimated ejection   fraction was in the range of 55% to 60%. Wall motion was normal;   there were no regional wall motion abnormalities.  Doppler   parameters are consistent with abnormal left ventricular   relaxation (grade 1 diastolic dysfunction). - Aortic valve: There was no stenosis. There was mild to moderate   regurgitation. - Mitral valve: There was trivial regurgitation. - Right ventricle: The cavity size was normal. Systolic function   was normal. - Right atrium: The atrium was mildly dilated. - Pulmonic valve: Turbulence across the pulmonic valve but peak   gradient 15 mmHg does not suggest significant stenosis. There was   mild regurgitation. Left atrium:  The atrium was normal in size. - Pulmonary arteries: PA peak pressure: 28 mm Hg (S). - Inferior vena cava: The vessel was normal in size. The   respirophasic diameter changes were in the normal range (>= 50%),   consistent with normal central venous pressure. - Pericardium, extracardiac: Small pericardial effusion, no   tamponade.   Impressions: - Normal LV size with EF 55-60%. Normal RV size and systolic   function. Mild to moderate aortic insufficiency. Turbulence   across the pulmonic valve but no significant stenosis by doppler   measurement.   LHC 11/2016 1. Double vessel CAD, stable.  2. Patent stent in the proximal to mid LAD with no restenosis 3. Patent stent in the mid Circumflex with no restenosis 4. The Diagonal branch is a small caliber vessel that is jailed by the LAD stent. There is severe disease in this small branch but PCI is not a good option 5. Small non-dominant RCA 6. Normal LV systolic function   Recommendations: Continue medical management of CAD.    ASSESSMENT:    1. Acute on chronic diastolic CHF (congestive heart failure), NYHA class 3 ([place])   2. Chronic pericarditis, unspecified complication status, unspecified type  PLAN:  In order of problems listed above:  1. Acute on chronic diastolic CHF - I increased Lasix to 40 mg by mouth twice a day and added spironolactone 25 mg po daily on 10/15 with significant  improvement in symptoms.  I will continue the same regimen. BMP and BNP today is pending. Follow up in 2 months, if she feels well, no need to follow up with Dr Norm Parcel. Need for low salt diet and regular exercise stressed again.  2. Chronic pericarditis with significant rub in systole and diastole on physical exam, despite prolonged repeated treatment with NSAIDS, Colchicine and steroids.  The patient has worsening symptoms suspicious for constrictive pericarditis, however right-sided cath in [facility] was negative. This was discussed with Dr. Norm Parcel, located 3 months of intensive heart failure management and if no improvement he will follow with her. As above, she might not need a follow up.  3. CAD s/p DES to [facility] and mLAD 07/11/15 stable without angina no restenosis on cath in 11/2016  4. Atrial fibrillation with RVR converted to normal sinus rhythm and maintaining normal sinus rhythm on Cardizem 240 mg daily metoprolol 25 mg twice a day. There is question whether she was referred to the A. fib clinic. Will confirm with Dr. [name].  5. Hypertension blood pressure - controlled.  Medication Adjustments/Labs and Tests Ordered: Current medicines are reviewed at length with the patient today.  Concerns regarding medicines are outlined above.  Medication changes, Labs and Tests ordered today are listed in the Patient Instructions below. Patient Instructions  Medication Instructions:  Your physician recommends that you continue on your current medications as directed. Please refer to the Current Medication list given to you today.   Labwork: Your physician recommends that you return for lab work in: TODAY (BMET/BNP)   Testing/Procedures: none  Follow-Up: Your physician recommends that you schedule a follow-up appointment in: December 2018   Any Other Special Instructions Will Be Listed Below (If Applicable).     If you need a refill on your cardiac medications before  your next appointment, please call your pharmacy.      Signed, Ena Dawley, MD  08/19/2017 2:17 PM    [facility] [address], [place], [place]  [address] Phone: [phone]; Fax: [phone]

## 2017-08-19 NOTE — Patient Instructions (Signed)
Medication Instructions:  Your physician recommends that you continue on your current medications as directed. Please refer to the Current Medication list given to you today.   Labwork: Your physician recommends that you return for lab work in: TODAY (BMET/BNP)   Testing/Procedures: none  Follow-Up: Your physician recommends that you schedule a follow-up appointment in: December 2018   Any Other Special Instructions Will Be Listed Below (If Applicable).     If you need a refill on your cardiac medications before your next appointment, please call your pharmacy.

## 2017-08-20 ENCOUNTER — Telehealth: Payer: Self-pay | Admitting: Nurse Practitioner

## 2017-08-20 LAB — BASIC METABOLIC PANEL
BUN/Creatinine Ratio: 20 (ref 9–23)
BUN: 29 mg/dL — ABNORMAL HIGH (ref 6–24)
CO2: 24 mmol/L (ref 20–29)
Calcium: 9.3 mg/dL (ref 8.7–10.2)
Chloride: 95 mmol/L — ABNORMAL LOW (ref 96–106)
Creatinine, Ser: 1.43 mg/dL — ABNORMAL HIGH (ref 0.57–1.00)
GFR calc Af Amer: 46 mL/min/{1.73_m2} — ABNORMAL LOW (ref >59)
GFR calc non Af Amer: 40 mL/min/{1.73_m2} — ABNORMAL LOW (ref >59)
Glucose: 366 mg/dL — ABNORMAL HIGH (ref 65–99)
Potassium: 4.6 mmol/L (ref 3.5–5.2)
Sodium: 134 mmol/L (ref 134–144)

## 2017-08-20 LAB — PRO B NATRIURETIC PEPTIDE: NT-Pro BNP: 136 pg/mL (ref 0–287)

## 2017-08-20 MED ORDER — FUROSEMIDE 40 MG PO TABS: 40.0000 mg | ORAL_TABLET | Freq: Every day | ORAL | 3 refills | Status: DC

## 2017-08-20 NOTE — Telephone Encounter (Signed)
-----   Message from Dorothy Spark, MD sent at 08/20/2017  2:20 PM EDT ----- Her heart failure is now significantly improved, but her kidney function is elevated, I will cut down Lasix to 40 mg daily and watch her diet with low-salt closely.  She will need repeat BMP at the next visit.

## 2017-08-20 NOTE — Telephone Encounter (Signed)
Reviewed results and plan of care with patient who verbalized understanding and agreement. She states she is working to improve her diet. She is aware of follow-up appointment.  She thanked me for the call.

## 2017-08-21 ENCOUNTER — Ambulatory Visit: Payer: Medicare Other | Admitting: Cardiology

## 2017-08-21 ENCOUNTER — Other Ambulatory Visit: Payer: Medicare Other

## 2017-08-27 ENCOUNTER — Other Ambulatory Visit: Payer: Self-pay | Admitting: Cardiology

## 2017-09-03 ENCOUNTER — Other Ambulatory Visit: Payer: Self-pay | Admitting: Emergency Medicine

## 2017-09-03 DIAGNOSIS — Z1231 Encounter for screening mammogram for malignant neoplasm of breast: Secondary | ICD-10-CM

## 2017-09-19 ENCOUNTER — Encounter (HOSPITAL_COMMUNITY): Payer: Self-pay

## 2017-09-19 ENCOUNTER — Other Ambulatory Visit: Payer: Self-pay

## 2017-09-19 ENCOUNTER — Emergency Department (HOSPITAL_COMMUNITY): Payer: Medicare Other

## 2017-09-19 DIAGNOSIS — Z5321 Procedure and treatment not carried out due to patient leaving prior to being seen by health care provider: Secondary | ICD-10-CM | POA: Diagnosis not present

## 2017-09-19 DIAGNOSIS — R079 Chest pain, unspecified: Secondary | ICD-10-CM | POA: Diagnosis present

## 2017-09-19 LAB — BASIC METABOLIC PANEL
Anion gap: 5 (ref 5–15)
BUN: 27 mg/dL — ABNORMAL HIGH (ref 6–20)
CO2: 26 mmol/L (ref 22–32)
Calcium: 9 mg/dL (ref 8.9–10.3)
Chloride: 105 mmol/L (ref 101–111)
Creatinine, Ser: 1.23 mg/dL — ABNORMAL HIGH (ref 0.44–1.00)
GFR calc Af Amer: 55 mL/min — ABNORMAL LOW (ref >60)
GFR calc non Af Amer: 47 mL/min — ABNORMAL LOW (ref >60)
Glucose, Bld: 288 mg/dL — ABNORMAL HIGH (ref 65–99)
Potassium: 4.4 mmol/L (ref 3.5–5.1)
Sodium: 136 mmol/L (ref 135–145)

## 2017-09-19 LAB — CBC
HCT: 33.8 % — ABNORMAL LOW (ref 36.0–46.0)
Hemoglobin: 10.6 g/dL — ABNORMAL LOW (ref 12.0–15.0)
MCH: 29.6 pg (ref 26.0–34.0)
MCHC: 31.4 g/dL (ref 30.0–36.0)
MCV: 94.4 fL (ref 78.0–100.0)
Platelets: 289 10*3/uL (ref 150–400)
RBC: 3.58 MIL/uL — ABNORMAL LOW (ref 3.87–5.11)
RDW: 13.3 % (ref 11.5–15.5)
WBC: 7.2 10*3/uL (ref 4.0–10.5)

## 2017-09-19 LAB — I-STAT TROPONIN, ED: Troponin i, poc: 0 ng/mL (ref 0.00–0.08)

## 2017-09-19 NOTE — ED Triage Notes (Signed)
Pt endorses chest pain since last week along with flank pain for several weeks due to a known kidney stone. Pt also complains of palpitations and has hx of a-fib with shob.

## 2017-09-20 ENCOUNTER — Emergency Department (HOSPITAL_COMMUNITY)
Admission: EM | Admit: 2017-09-20 | Discharge: 2017-09-20 | Disposition: A | Discharge status: Home / Self Care | Payer: Medicare Other | Attending: Emergency Medicine | Admitting: Emergency Medicine

## 2017-09-20 NOTE — ED Notes (Signed)
LWBS 

## 2017-09-23 ENCOUNTER — Telehealth: Payer: Self-pay | Admitting: Cardiology

## 2017-09-23 NOTE — Telephone Encounter (Signed)
Advised patient to avoid medications w/ decongestants/sudafed in them.   Examples given to pt were: Robitussin plain or Coricidin cold w/ the red heart on the box. Pt appreciates the recommendations.

## 2017-09-23 NOTE — Telephone Encounter (Signed)
New message    Patient calling; wants to know what type  of cough medication is best to take with blood pressure medications. Please call

## 2017-09-27 ENCOUNTER — Other Ambulatory Visit: Payer: Self-pay | Admitting: Cardiology

## 2017-09-27 DIAGNOSIS — R059 Cough, unspecified: Secondary | ICD-10-CM

## 2017-09-27 DIAGNOSIS — I480 Paroxysmal atrial fibrillation: Secondary | ICD-10-CM

## 2017-09-27 DIAGNOSIS — E782 Mixed hyperlipidemia: Secondary | ICD-10-CM

## 2017-09-27 DIAGNOSIS — R05 Cough: Secondary | ICD-10-CM

## 2017-09-27 DIAGNOSIS — I10 Essential (primary) hypertension: Secondary | ICD-10-CM

## 2017-09-27 DIAGNOSIS — I2510 Atherosclerotic heart disease of native coronary artery without angina pectoris: Secondary | ICD-10-CM

## 2017-10-01 ENCOUNTER — Ambulatory Visit
Admission: RE | Admit: 2017-10-01 | Discharge: 2017-10-01 | Disposition: A | Discharge status: Home / Self Care | Payer: Medicare Other | Source: Ambulatory Visit | Attending: Emergency Medicine | Admitting: Emergency Medicine

## 2017-10-01 DIAGNOSIS — Z1231 Encounter for screening mammogram for malignant neoplasm of breast: Secondary | ICD-10-CM

## 2017-10-03 ENCOUNTER — Other Ambulatory Visit: Payer: Self-pay | Admitting: Cardiology

## 2017-10-06 ENCOUNTER — Encounter: Payer: Self-pay | Admitting: Cardiology

## 2017-10-06 ENCOUNTER — Encounter (INDEPENDENT_AMBULATORY_CARE_PROVIDER_SITE_OTHER): Payer: Self-pay

## 2017-10-06 ENCOUNTER — Ambulatory Visit: Payer: Medicare Other | Admitting: Cardiology

## 2017-10-06 VITALS — BP 132/80 | HR 73 | Ht 67.5 in | Wt 246.0 lb

## 2017-10-06 DIAGNOSIS — Z9861 Coronary angioplasty status: Secondary | ICD-10-CM

## 2017-10-06 DIAGNOSIS — I319 Disease of pericardium, unspecified: Secondary | ICD-10-CM | POA: Diagnosis not present

## 2017-10-06 DIAGNOSIS — I5033 Acute on chronic diastolic (congestive) heart failure: Secondary | ICD-10-CM | POA: Diagnosis not present

## 2017-10-06 DIAGNOSIS — I480 Paroxysmal atrial fibrillation: Secondary | ICD-10-CM | POA: Diagnosis not present

## 2017-10-06 DIAGNOSIS — I2510 Atherosclerotic heart disease of native coronary artery without angina pectoris: Secondary | ICD-10-CM | POA: Diagnosis not present

## 2017-10-06 DIAGNOSIS — I10 Essential (primary) hypertension: Secondary | ICD-10-CM | POA: Diagnosis not present

## 2017-10-06 NOTE — Progress Notes (Signed)
Cardiology Office Note    Date:  10/06/2017   ID:  [name], DOB August 20, 1956, MRN [id number]  PCP:  Patient, No Pcp Per  Cardiologist: Dr. [name]  Chief complain: Follow-up for pericarditis and CHF   History of Present Illness:  [name] is a 61 y.o. female with a history of CAD s/p DES to [facility] and mLAD 07/11/15, chronic pericardial effusion, DM, HTN, remote h/o treated non-small cell lung cancer, pulmonary fibrosis, aortic insufficiency, COPD, anemia and mod AI by echo 05/2015. PAF converted to NSR 08/2016 on metoprolol and eliquis for CHADSVASC=4.  Acute pericarditis 11/2016 treated with colchicine and ibuprofen. Cardiac cath 11/2016 patent stent in the proximal to mid LAD, patent stent in the mid circumflex diagonal branch small caliber vessel and jailed by the LAD stent. Severe disease in the small branch but PCI's not a good option. Small nondominant RCA and normal LV systolic function.  Last saw Dr. [name] 03/26/17 still complaining of significant positional chest pain with loud pericardial rub on physical exam and worsening dyspnea on exertion. She was worried about constrictive pericarditis and ordered a cardiac MRI. This was done 04/14/17 and showed a moderate pericardial effusion lateral to the RV free wall and posterior LV myocardium. This was not appreciated on recent echo. There was normal pericardial thickness with mild gadolinium uptake over the RV free wall and apex. There was no evidence of constriction, no evidence of myocarditis normal LVEF 59%.  Patient went to the emergency room 04/14/17 was in A. fib with RVR and spontaneously converted to normal sinus rhythm. Aspirin was stopped this admission. Echocardiogram did not demonstrate pericardial effusion. Because of her recurrent symptoms of pericarditis refractory to colchicine and NSAIDs a steroid taper over 6 weeks was ordered.  06/13/17 - Patient comes in today accompanied by her sister. She is not feeling any better. She  still gets chest pain when laying down are very short distance walking. Her shortness of breath is worsening, now even when walking around the house.  She experiences chest pain daily and steroids provided no relief.  08/04/2017 the patient underwent right cardiac catheterization in the hospital that showed elevated LVEDP consistent with volume overload and no evidence of ventricular inter dependence. She was referred for consultation to Dr. Norm Parcel at [facility] who is a transplant surgeon and performs pericardiectomy is, he repeated right-sided cardiac catheterization with same results as in [facility]. She continues to have same symptoms, two-pillow orthopnea and exertional dyspnea and chest tightness.  08/19/2017 - 2 weeks follow up, she feels significantly better with increased dose of lasix and adding spironolactone, LE edema has resolved, orthopnea and PND has resolved and chest pain has improved. She just underwent lab work today before the clinic. She is watching her diet more closely.  10/06/2017 - the patient is coming after 2 months, she feels significantly better, she is minimal lower extremity edema on and off chest pain on exertion as well as shortness of breath, she has been going to the gym, and on November 30 to go to the ER after a gym for concern of chest pain and palpitations. When she got there EKG showed normal sinus rhythm, and because there was a prolonged wait she eventually made from ER waiting room. She hasn't been to the gym since then. She continues to work, denies orthopnea paroxysmal nocturnal dyspnea. She has been compliant with her medications.  Past Medical History:  Diagnosis Date  . Allergy   . Anemia   .  Anemia   . Aortic insufficiency    a. Mod by echo 05/2015.  . Arthritis    "knees" (07/11/2015)  . Atrial fibrillation (HCC)    On Eliquis  . Carcinoma of hilus of lung ([place]) 01/20/2012   IIIB NSCL  Right hilar mass compressing esophagus/presenting with  dysphagia Rx Surgery/RT/chemo dx January 2001  . Complication of anesthesia    "I had a hard time waking me up w/one of my shoulder surgeries"  . Coronary artery disease    a. Abnormal stress test -> LHc 06/2015 s/p DES to mCX and mLAD, residual D2 disease treated medially.  . Elevated blood pressure   . Erythema nodosum   . Heart murmur   . Insomnia   . lung ca dx'd 10/1999   chemo/xrt comp 05/29/2000  . Myalgia   . Nephrolithiasis 2015  . Obesity   . Pericardial effusion    a. 05/2015 Echo: EF 55-60%, no rwma, Gr 1 DD, no effusion - but an effusion was seen on CT which was felt to be increased in size compared to 12/2014 - pericardium also thickened.   . Pericarditis    a. Dx 05/2015.  [name] Pneumothorax, spontaneous, tension 01/20/2012   October, 2010  . Pulmonary fibrosis ([place]) 01/20/2012   Due to previous surgery and chest radiation for lung cancer  . Restrictive lung disease   . Type II diabetes mellitus ([place])     Past Surgical History:  Procedure Laterality Date  . ABDOMINAL HYSTERECTOMY  2008  . APPENDECTOMY  1969?  [name] CARDIAC CATHETERIZATION N/A 07/11/2015   Procedure: Left Heart Cath and Coronary Angiography;  Surgeon: Jettie Booze, MD;  Location: [place] [facility];  Service: Cardiovascular;  Laterality: N/A;  . CARDIAC CATHETERIZATION  07/11/2015   Procedure: Coronary Stent Intervention;  Surgeon: Jettie Booze, MD;  Location: [place] [facility];  Service: Cardiovascular;;  . CARPAL TUNNEL RELEASE Bilateral 1998-2005?   right-left  . CARPAL TUNNEL RELEASE Left 2009?   "for the 2nd time"  . COLONOSCOPY    . CORONARY ANGIOPLASTY    . CYSTOSCOPY W/ STONE MANIPULATION  2013  . ESOPHAGOGASTRODUODENOSCOPY (EGD) WITH ESOPHAGEAL DILATION  2012  . FOOT SURGERY Bilateral 1980's   Callous removed   . INNER EAR SURGERY Right ~ 2009  . KNEE ARTHROPLASTY Right 1991  . KNEE ARTHROSCOPY  ~ 2000   LATERAL RELEASE  . LEFT HEART CATH AND CORONARY ANGIOGRAPHY N/A 12/18/2016    Procedure: Left Heart Cath and Coronary Angiography;  Surgeon: Burnell Blanks, MD;  Location: [place] [facility];  Service: Cardiovascular;  Laterality: N/A;  . LUNG CANCER SURGERY Left 2001   "small cell"  . LUNG SURGERY Right 2007   "reinflated it"  . PULMONARY EMBOLISM SURGERY  2007   LUNG COLLAPSE  . RIGHT AND LEFT HEART CATH N/A 06/25/2017   Procedure: RIGHT AND LEFT HEART CATH;  Surgeon: Sherren Mocha, MD;  Location: [place] [facility];  Service: Cardiovascular;  Laterality: N/A;  . SHOULDER ADHESION RELEASE Right ~ 2004  . SHOULDER ARTHROSCOPY W/ ROTATOR CUFF REPAIR Right ~ 2003    Current Medications: Current Meds  Medication Sig  . apixaban (ELIQUIS) 5 MG TABS tablet TAKE 1 TABLET BY MOUTH 2 (TWO) TIMES DAILY. (Patient taking differently: Take 5 mg by mouth 2 (two) times daily. TAKE 1 TABLET BY MOUTH 2 (TWO) TIMES DAILY.)  . diltiazem (CARDIZEM CD) 240 MG 24 hr capsule Take 1 capsule (240 mg total) by mouth  daily.  . furosemide (LASIX) 40 MG tablet Take 1 tablet (40 mg total) by mouth daily.  [name] HUMALOG MIX 75/25 KWIKPEN (75-25) 100 UNIT/ML Kwikpen Inject into the skin 2 (two) times daily. Inject 50 units sq in Am and 30 units subque in PM  . levothyroxine (SYNTHROID, LEVOTHROID) 50 MCG tablet Take 50 mcg by mouth daily.  [name] losartan (COZAAR) 25 MG tablet TAKE 1 TABLET (25 MG TOTAL) BY MOUTH DAILY.  . meloxicam (MOBIC) 15 MG tablet Take 1 tablet by mouth daily with breakfast.  . metoprolol tartrate (LOPRESSOR) 25 MG tablet TAKE 1 TABLET (25 MG TOTAL) BY MOUTH 2 (TWO) TIMES DAILY.  [name] potassium chloride SA (K-DUR,KLOR-CON) 20 MEQ tablet Take 1 tablet (20 mEq total) by mouth daily.  . rosuvastatin (CRESTOR) 5 MG tablet Take 1 tablet (5 mg total) by mouth daily. (Patient taking differently: Take 5 mg by mouth at bedtime. )  . spironolactone (ALDACTONE) 25 MG tablet Take 0.5 tablets (12.5 mg total) by mouth daily.     Allergies:   Hydrocodone; Hydrocodone-acetaminophen;  Morphine and related; Lisinopril; and Nsaids   Social History   Socioeconomic History  . Marital status: Single    Spouse name: None  . Number of children: 0  . Years of education: None  . Highest education level: None  Social Needs  . Financial resource strain: None  . Food insecurity - worry: None  . Food insecurity - inability: None  . Transportation needs - medical: None  . Transportation needs - non-medical: None  Occupational History  . Occupation: RETIRED  Tobacco Use  . Smoking status: Former Smoker    Packs/day: 0.25    Years: 5.00    Pack years: 1.25    Types: Cigarettes    Last attempt to quit: 07/22/1999    Years since quitting: 18.2  . Smokeless tobacco: Never Used  Substance and Sexual Activity  . Alcohol use: No    Alcohol/week: 0.0 oz  . Drug use: No  . Sexual activity: Not Currently    Birth control/protection: Other-see comments    Comment: hysterectomy  Other Topics Concern  . None  Social History Narrative  . None     Family History:  The patient's family history includes Diabetes in her father, maternal grandmother, mother, paternal grandmother, sister, sister, sister, and sister; Heart disease in her father; Hypertension in her sister; Kidney disease in her sister; Pancreatic cancer in her mother.   ROS:   Please see the history of present illness.    Review of Systems  Constitution: Negative.  HENT: Negative.   Eyes: Negative.   Cardiovascular: Positive for chest pain, dyspnea on exertion and palpitations.  Respiratory: Negative.   Hematologic/Lymphatic: Negative.   Musculoskeletal: Negative.  Negative for joint pain.  Gastrointestinal: Negative.   Genitourinary: Negative.   Neurological: Negative.    All other systems reviewed and are negative.   PHYSICAL EXAM:   VS:  BP 132/80   Pulse 73   Ht 5' 7.5" (1.715 m)   Wt 246 lb (111.6 kg)   SpO2 97%   BMI 37.96 kg/m   Physical Exam  GEN: Well nourished, well developed, in no acute  distress  Neck: no JVD, carotid bruits, or masses Cardiac:RRR; Loud 3/6 rub in systole and diastole Respiratory:  clear to auscultation bilaterally, normal work of breathing GI: soft, nontender, nondistended, + BS Ext: without cyanosis, clubbing, or edema, Good distal pulses bilaterally Neuro:  Alert and Oriented x 3 Psych: euthymic mood,  full affect  Wt Readings from Last 3 Encounters:  10/06/17 246 lb (111.6 kg)  08/19/17 250 lb (113.4 kg)  08/04/17 259 lb (117.5 kg)      Studies/Labs Reviewed:   EKG:  EKG is  ordered today.  The ekg ordered today demonstrates Normal sinus rhythm with ST elevation throughout  Recent Labs: 04/12/2017: B Natriuretic Peptide 59.8; TSH 2.776 04/13/2017: ALT 22 08/19/2017: NT-Pro BNP 136 09/19/2017: BUN 27; Creatinine, Ser 1.23; Hemoglobin 10.6; Platelets 289; Potassium 4.4; Sodium 136   Lipid Panel    Component Value Date/Time   CHOL 104 (L) 08/04/2015 1127   TRIG 74 08/04/2015 1127   HDL 47 08/04/2015 1127   CHOLHDL 2.2 08/04/2015 1127   VLDL 15 08/04/2015 1127   LDLCALC 42 08/04/2015 1127    Additional studies/ records that were reviewed today include:   Cardiac MRI 04/14/2017  1) Moderate pericardial effusion lateral to the RV free wall and posterior LV myocardium. Note this is not appreciated on recent echo   2) Normal pericardial thickness with mild gadolinium uptake over the RV free wall and apex on 3 chamber view   3) No evidence of constriction although MRI especially with no free breathing sequences not ideal to assess Pericardium normal thickness and no ventricular inter dependence   4) Normal LV EF 59% (EDV 105 ESV 43 SV 62 cc)   5) No evidence of myocarditis or LV myocardial uptake of gadolinium on delayed inversion recovery sequences   6) Tri leaflet aortic valve with AR   7) Mild LAE   Images were also reviewed by Dr Loralie Champagne who agreed with above findings   TTE: 04/13/17   Study Conclusions   - Left  ventricle: The cavity size was normal. Wall thickness was   normal. Systolic function was normal. The estimated ejection   fraction was in the range of 55% to 60%. - Aortic valve: There was moderate regurgitation. Valve area (VTI):   1.99 cm^2. Valve area (Vmax): 2.04 cm^2. Valve area (Vmean): 1.95   cm^2. - Atrial septum: No defect or patent foramen ovale was identified. _____________ Nuclear stress test: 11/29/2015  Nuclear stress EF: 62%.  There is a small defect of mild severity present in the mid anteroseptal location. The defect is non-reversible and consistent with breast attenuation artifact vs. prior infarct. No ischemia noted.  This is a low risk study.  The left ventricular ejection fraction is normal (55-65%).  Compared to study of 2016, there is now normal perfusion in the apical anterior and apical myocardium and the defect in the mid anteroseptum is fixed.   TTE: 09/18/2016   - Left ventricle: The cavity size was normal. Wall thickness was   normal. Systolic function was normal. The estimated ejection   fraction was in the range of 55% to 60%. Wall motion was normal;   there were no regional wall motion abnormalities. Doppler   parameters are consistent with abnormal left ventricular   relaxation (grade 1 diastolic dysfunction). - Aortic valve: There was no stenosis. There was mild to moderate   regurgitation. - Mitral valve: There was trivial regurgitation. - Right ventricle: The cavity size was normal. Systolic function   was normal. - Right atrium: The atrium was mildly dilated. - Pulmonic valve: Turbulence across the pulmonic valve but peak   gradient 15 mmHg does not suggest significant stenosis. There was   mild regurgitation. Left atrium:  The atrium was normal in size. - Pulmonary arteries: PA peak pressure: 28  mm Hg (S). - Inferior vena cava: The vessel was normal in size. The   respirophasic diameter changes were in the normal range (>= 50%),    consistent with normal central venous pressure. - Pericardium, extracardiac: Small pericardial effusion, no   tamponade.   Impressions: - Normal LV size with EF 55-60%. Normal RV size and systolic   function. Mild to moderate aortic insufficiency. Turbulence   across the pulmonic valve but no significant stenosis by doppler   measurement.   LHC 11/2016 1. Double vessel CAD, stable.  2. Patent stent in the proximal to mid LAD with no restenosis 3. Patent stent in the mid Circumflex with no restenosis 4. The Diagonal branch is a small caliber vessel that is jailed by the LAD stent. There is severe disease in this small branch but PCI is not a good option 5. Small non-dominant RCA 6. Normal LV systolic function   Recommendations: Continue medical management of CAD.    ASSESSMENT:    1. Acute on chronic diastolic CHF (congestive heart failure), NYHA class 3 ([place])   2. Chronic pericarditis, unspecified complication status, unspecified type   3. CAD S/P percutaneous coronary angioplasty   4. Hypertension, essential   5. PAF (paroxysmal atrial fibrillation) (HCC)    PLAN:  In order of problems listed above:  1. Acute on chronic diastolic CHF - I increased Lasix to 40 mg by mouth twice a day and added spironolactone 25 mg po daily on 10/15 with significant improvement in symptoms. We then decreased Lasix to once daily, she is doing well, I would continue the same management. She is encouraged to go to gym 4-5 times a week start slowly and change different equipments. BMP and BNP were normal 2 months ago.. Follow up in 2 months, he feels well, no need to follow up with Dr Norm Parcel. Need for low salt diet and regular exercise stressed again.  2. Chronic pericarditis with significant rub in systole and diastole on physical exam, despite prolonged repeated treatment with NSAIDS, Colchicine and steroids.  The patient has worsening symptoms suspicious for constrictive pericarditis, however  right-sided cath in [facility] and [facility] was negative. This was discussed with Dr. Norm Parcel, she has improved with CHF therapy no need to follow-up with him.   3. CAD s/p DES to [facility] and mLAD 07/11/15 stable without angina no restenosis on cath in 11/2016  4. Atrial fibrillation with RVR converted to normal sinus rhythm and maintaining normal sinus rhythm on Cardizem 240 mg daily metoprolol 25 mg twice a day.   5. Hypertension blood pressure - controlled.  Medication Adjustments/Labs and Tests Ordered: Current medicines are reviewed at length with the patient today.  Concerns regarding medicines are outlined above.  Medication changes, Labs and Tests ordered today are listed in the Patient Instructions below. Patient Instructions  Medication Instructions:   Your physician recommends that you continue on your current medications as directed. Please refer to the Current Medication list given to you today.    Follow-Up:  Your physician wants you to follow-up in: [place] will receive a reminder letter in the mail two months in advance. If you don't receive a letter, please call our office to schedule the follow-up appointment.        If you need a refill on your cardiac medications before your next appointment, please call your pharmacy.      Signed, Ena Dawley, MD  10/06/2017 1:02 PM    [facility] [address], [place], [place]  [address] Phone: [phone]; Fax: [phone]

## 2017-10-06 NOTE — Patient Instructions (Signed)
Medication Instructions:   Your physician recommends that you continue on your current medications as directed. Please refer to the Current Medication list given to you today.    Follow-Up:  Your physician wants you to follow-up in: [place] will receive a reminder letter in the mail two months in advance. If you don't receive a letter, please call our office to schedule the follow-up appointment.        If you need a refill on your cardiac medications before your next appointment, please call your pharmacy.

## 2017-10-22 ENCOUNTER — Encounter: Payer: Self-pay | Admitting: *Deleted

## 2017-10-22 ENCOUNTER — Telehealth: Payer: Self-pay | Admitting: Cardiology

## 2017-10-22 NOTE — Telephone Encounter (Signed)
Dr [name], pt needs a clearance letter stating its ok from a cardiac perspective to have a massage done.  Will follow-up with the pt accordingly thereafter.

## 2017-10-22 NOTE — Telephone Encounter (Signed)
Ms.Lugar is calling because she received a massage gift Certificate and she is needing a letter stating that it is okay for her to get the massage . Please call

## 2017-10-22 NOTE — Telephone Encounter (Signed)
Informed the pt that her clearance letter for her massage was written by Dr [name] and will be mailed to her confirmed mailing address.  Pt verbalized understanding and agrees with this plan.  Pt gracious for all the assistance.

## 2017-10-22 NOTE — Telephone Encounter (Signed)
To Whom It May Concern,  There is no contraindication from the cardiac standpoint for this patient to have a massage.  Please call us with any questions.  Sincerely,   Ena Dawley, MD

## 2017-11-27 ENCOUNTER — Ambulatory Visit: Payer: Medicare Other | Admitting: Physician Assistant

## 2017-11-27 ENCOUNTER — Encounter: Payer: Self-pay | Admitting: Physician Assistant

## 2017-11-27 ENCOUNTER — Encounter (INDEPENDENT_AMBULATORY_CARE_PROVIDER_SITE_OTHER): Payer: Self-pay

## 2017-11-27 ENCOUNTER — Encounter: Payer: Self-pay | Admitting: Gastroenterology

## 2017-11-27 VITALS — BP 120/58 | HR 72 | Ht 67.5 in | Wt 261.0 lb

## 2017-11-27 DIAGNOSIS — R1310 Dysphagia, unspecified: Secondary | ICD-10-CM | POA: Diagnosis not present

## 2017-11-27 DIAGNOSIS — K5900 Constipation, unspecified: Secondary | ICD-10-CM | POA: Diagnosis not present

## 2017-11-27 DIAGNOSIS — R490 Dysphonia: Secondary | ICD-10-CM | POA: Diagnosis not present

## 2017-11-27 NOTE — Progress Notes (Signed)
Chief Complaint: Hoarseness, dysphagia and constipation  Review of gastrointestinal problems:   1. Routine risk for colon cancer; Feb 2009 colonoscopy with dr. Lajoyce Corners, no polyps. Next colonoscopy should be 11/2017;  09/2011 repeat colonoscopy Dr. Ardis Hughs for change in bowels (looser) found L sided diverticulosis, mucosa normal (no polyps or cancer), random biospies no microscopic colitis. 2. submucosal gastric lesion, first noted early 2009. Endoscopic ultrasound procedures have been performed, none with firm tissue diagnosis, next Examination at 3 year interval given stability, small size of lesion. Repeat endoscopic ultrasound 2010, discrete submucosal lesion, 1.8 cm. Repeat evaluation was set for 3 year interval.  EGD 07/2012 Dr. Ardis Hughs showed nodule has not changed in size, recommended recall again at 3 years. EGD 12/2015 Dr. Ardis Hughs no change in size; 1.8cm "the mass in proximal stomach has not changed in 8 years of surveillance." 3. intermittent dysphagia since radiation, chemotherapy for lung cancer. Barium esophagram 2009 showed decreased peristalsis but no discrete strictures. Dr. Beryle Beams, Dr. Arlyce Dice, originally diagnosed in 2001.  HPI:    [name] is a 62 year old African-American female with a past medical history as listed below including A. fib on Eliquis, who is known to Dr. Ardis Hughs and presents to clinic today with a continued complaint of hoarseness and dysphagia as well as constipation.    Please recall patient was last seen in clinic 03/18/17 by Dr. Ardis Hughs and at that time she described hoarseness and mild intermittent dysphagia.  Her history of lung cancer and prior therapy with chemoradiation was discussed.  It was noted that her hoarseness may be a late result of radiation to her chest.  CT chest with contrast was ordered and if this is negative would recommend the patient proceed with an EGD for her mild intermittent dysphagia.  CT did not show recurrence of lung cancer.  She was  scheduled for an EGD in August.    Around the time of her scheduled EGD patient was diagnosed with pericarditis and the EGD was postponed.    Today, patient presents to clinic and explains that her pericarditis is much better.  She has had a complete workup and follow through with cardiology and has been deemed okay for any procedures.  She continues with her hoarseness which is intermittent as well as dysphagia to even liquids now.  The patient tells me that 9 times out of 10 water will get hung up in her throat and even sometimes come back out.  She has to take very small sips and chew very well and can avoid most of the symptoms.  Patient denies any heartburn or reflux.    Patient also describes going 2 weeks between bowel movements.  She does uses what sounds like an over-the-counter stimulant laxative in order to have a bowel movement eventually.  In between the patient is somewhat bloated and uncomfortable.  It does not sound like she ever gets very good bowel movements, describing even small balls of stool, even after using a laxative.    Patient denies fever, chills, blood in her stool, melena, weight loss, anorexia, nausea, vomiting or symptoms that awaken her at night.  Past Medical History:  Diagnosis Date  . Allergy   . Anemia   . Anemia   . Aortic insufficiency    a. Mod by echo 05/2015.  . Arthritis    "knees" (07/11/2015)  . Atrial fibrillation (HCC)    On Eliquis  . 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 Complication of anesthesia    "I had a hard time waking me up w/one of my shoulder surgeries"  . Coronary artery disease    a. Abnormal stress test -> LHc 06/2015 s/p DES to mCX and mLAD, residual D2 disease treated medially.  . Elevated blood pressure   . Erythema nodosum   . Heart murmur   . Insomnia   . lung ca dx'd 10/1999   chemo/xrt comp 05/29/2000  .  Myalgia   . Nephrolithiasis 2015  . Obesity   . Pericardial effusion    a. 05/2015 Echo: EF 55-60%, no rwma, Gr 1 DD, no effusion - but an effusion was seen on CT which was felt to be increased in size compared to 12/2014 - pericardium also thickened.   . Pericarditis    a. Dx 05/2015.  [name] Pneumothorax, spontaneous, tension 01/20/2012   October, 2010  . Pulmonary fibrosis ([place]) 01/20/2012   Due to previous surgery and chest radiation for lung cancer  . Restrictive lung disease   . Type II diabetes mellitus ([place])     Past Surgical History:  Procedure Laterality Date  . ABDOMINAL HYSTERECTOMY  2008  . APPENDECTOMY  1969?  [name] CARDIAC CATHETERIZATION N/A 07/11/2015   Procedure: Left Heart Cath and Coronary Angiography;  Surgeon: Jettie Booze, MD;  Location: [facility];  Service: Cardiovascular;  Laterality: N/A;  . CARDIAC CATHETERIZATION  07/11/2015   Procedure: Coronary Stent Intervention;  Surgeon: Jettie Booze, MD;  Location: [facility];  Service: Cardiovascular;;  . CARPAL TUNNEL RELEASE Bilateral 1998-2005?   right-left  . CARPAL TUNNEL RELEASE Left 2009?   "for the 2nd time"  . COLONOSCOPY    . CORONARY ANGIOPLASTY    . CYSTOSCOPY W/ STONE MANIPULATION  2013  . ESOPHAGOGASTRODUODENOSCOPY (EGD) WITH ESOPHAGEAL DILATION  2012  . FOOT SURGERY Bilateral 1980's   Callous removed   . INNER EAR SURGERY Right ~ 2009  . KNEE ARTHROPLASTY Right 1991  . KNEE ARTHROSCOPY  ~ 2000   LATERAL RELEASE  . LEFT HEART CATH AND CORONARY ANGIOGRAPHY N/A 12/18/2016   Procedure: Left Heart Cath and Coronary Angiography;  Surgeon: Burnell Blanks, MD;  Location: [facility];  Service: Cardiovascular;  Laterality: N/A;  . LUNG CANCER SURGERY Left 2001   "small cell"  . LUNG SURGERY Right 2007   "reinflated it"  . PULMONARY EMBOLISM SURGERY  2007   LUNG COLLAPSE  . RIGHT AND LEFT HEART CATH N/A 06/25/2017   Procedure: RIGHT AND LEFT HEART CATH;  Surgeon: Sherren Mocha, MD;  Location: [facility];  Service: Cardiovascular;  Laterality: N/A;  . SHOULDER ADHESION RELEASE Right ~ 2004  . SHOULDER ARTHROSCOPY W/ ROTATOR CUFF REPAIR Right ~ 2003    Current Outpatient Medications  Medication Sig Dispense Refill  . apixaban (ELIQUIS) 5 MG TABS tablet TAKE 1 TABLET BY MOUTH 2 (TWO) TIMES DAILY. 180 tablet 1  . diltiazem (CARDIZEM CD) 240 MG 24 hr capsule Take 1 capsule (240 mg total) by mouth daily. 90 capsule 3  . HUMALOG MIX 75/25 KWIKPEN (75-25) 100 UNIT/ML Kwikpen Inject into the skin 2 (two) times daily. Inject 50 units sq in Am and 30 units subque in PM  6  . levothyroxine (SYNTHROID, LEVOTHROID) 50 MCG tablet Take 50 mcg by mouth daily.  6  . losartan (COZAAR) 25 MG tablet TAKE 1 TABLET (25 MG TOTAL)  BY MOUTH DAILY. 90 tablet 3  . meloxicam (MOBIC) 15 MG tablet Take 1 tablet by mouth daily with breakfast.  1  . metoprolol tartrate (LOPRESSOR) 25 MG tablet TAKE 1 TABLET (25 MG TOTAL) BY MOUTH 2 (TWO) TIMES DAILY. 180 tablet 3  . potassium chloride SA (K-DUR,KLOR-CON) 20 MEQ tablet Take 1 tablet (20 mEq total) by mouth daily. 90 tablet 1  . rosuvastatin (CRESTOR) 5 MG tablet Take 1 tablet (5 mg total) by mouth daily. 90 tablet 3  . furosemide (LASIX) 40 MG tablet Take 1 tablet (40 mg total) by mouth daily. 90 tablet 3  . spironolactone (ALDACTONE) 25 MG tablet Take 0.5 tablets (12.5 mg total) by mouth daily. 45 tablet 3   No current facility-administered medications for this visit.     Allergies as of 11/27/2017 - Review Complete 11/27/2017  Allergen Reaction Noted  . Hydrocodone Hives and Itching   . Hydrocodone-acetaminophen Hives 07/23/2017  . Morphine and related Other (See Comments) 11/09/2011  . Lisinopril Other (See Comments) and Cough 07/25/2015  . Nsaids Other (See Comments) 11/09/2011    Family History  Problem Relation Age of Onset  . Heart disease Father   . Diabetes Father   . Pancreatic cancer Mother   . Diabetes Mother    . Hypertension Sister        x 3  . Diabetes Sister        x 4  . Diabetes Maternal Grandmother   . Diabetes Paternal Grandmother   . Diabetes Sister   . Diabetes Sister   . Diabetes Sister   . Kidney disease Sister   . Colon cancer Neg Hx   . Colon polyps Neg Hx   . Rectal cancer Neg Hx   . Stomach cancer Neg Hx   . Esophageal cancer Neg Hx   . Breast cancer Neg Hx     Social History   Socioeconomic History  . Marital status: Single    Spouse name: Not on file  . Number of children: 0  . Years of education: Not on file  . Highest education level: Not on file  Social Needs  . Financial resource strain: Not on file  . Food insecurity - worry: Not on file  . Food insecurity - inability: Not on file  . Transportation needs - medical: Not on file  . Transportation needs - non-medical: Not on file  Occupational History  . Occupation: RETIRED  Tobacco Use  . Smoking status: Former Smoker    Packs/day: 0.25    Years: 5.00    Pack years: 1.25    Types: Cigarettes    Last attempt to quit: 07/22/1999    Years since quitting: 18.3  . Smokeless tobacco: Never Used  Substance and Sexual Activity  . Alcohol use: No    Alcohol/week: 0.0 oz  . Drug use: No  . Sexual activity: Not Currently    Birth control/protection: Other-see comments    Comment: hysterectomy  Other Topics Concern  . Not on file  Social History Narrative  . Not on file    Review of Systems:    Constitutional: No weight loss, fever or chills Skin: No rash  Cardiovascular: No chest pain Respiratory: No SOB Gastrointestinal: See HPI and otherwise negative   Physical Exam:  Vital signs: BP (!) 120/58   Pulse 72   Ht 5' 7.5" (1.715 m)   Wt 261 lb (118.4 kg)   BMI 40.28 kg/m   Constitutional:   Pleasant obese  AA female appears to be in NAD, Well developed, Well nourished, alert and cooperative Respiratory: Respirations even and unlabored. Lungs clear to auscultation bilaterally.   No wheezes,  crackles, or rhonchi.  Cardiovascular: Normal S1, S2. No MRG. Regular rate and rhythm. No peripheral edema, cyanosis or pallor.  Gastrointestinal:  Soft, nondistended, nontender. No rebound or guarding. Normal bowel sounds. No appreciable masses or hepatomegaly. Psychiatric: Oriented to person, place and time. Demonstrates good judgement and reason without abnormal affect or behaviors.  MOST RECENT LABS AND IMAGING: CBC    Component Value Date/Time   WBC 7.2 09/19/2017 1740   RBC 3.58 (L) 09/19/2017 1740   HGB 10.6 (L) 09/19/2017 1740   HGB 11.5 06/20/2017 1057   HGB 12.1 01/09/2015 1109   HCT 33.8 (L) 09/19/2017 1740   HCT 33.8 (L) 06/20/2017 1057   HCT 36.9 01/09/2015 1109   PLT 289 09/19/2017 1740   PLT 368 06/20/2017 1057   MCV 94.4 09/19/2017 1740   MCV 93 06/20/2017 1057   MCV 93.4 01/09/2015 1109   MCH 29.6 09/19/2017 1740   MCHC 31.4 09/19/2017 1740   RDW 13.3 09/19/2017 1740   RDW 13.1 06/20/2017 1057   RDW 12.2 01/09/2015 1109   LYMPHSABS 1.9 06/20/2017 1057   LYMPHSABS 1.8 01/09/2015 1109   MONOABS 0.8 09/09/2016 1505   MONOABS 0.4 01/09/2015 1109   EOSABS 0.3 06/20/2017 1057   BASOSABS 0.0 06/20/2017 1057   BASOSABS 0.0 01/09/2015 1109    CMP     Component Value Date/Time   NA 136 09/19/2017 1740   NA 134 08/19/2017 1301   NA 144 01/09/2015 1109   K 4.4 09/19/2017 1740   K 4.2 01/09/2015 1109   CL 105 09/19/2017 1740   CL 105 01/19/2013 1126   CO2 26 09/19/2017 1740   CO2 28 01/09/2015 1109   GLUCOSE 288 (H) 09/19/2017 1740   GLUCOSE 71 01/09/2015 1109   GLUCOSE 180 (H) 01/19/2013 1126   BUN 27 (H) 09/19/2017 1740   BUN 29 (H) 08/19/2017 1301   BUN 13.7 01/09/2015 1109   CREATININE 1.23 (H) 09/19/2017 1740   CREATININE 0.90 08/04/2015 1127   CREATININE 0.8 01/09/2015 1109   CALCIUM 9.0 09/19/2017 1740   CALCIUM 9.2 01/09/2015 1109   PROT 6.2 (L) 04/13/2017 0228   PROT 7.1 01/09/2015 1109   ALBUMIN 3.4 (L) 04/13/2017 0228   ALBUMIN 3.5  01/09/2015 1109   AST 24 04/13/2017 0228   AST 16 01/09/2015 1109   ALT 22 04/13/2017 0228   ALT 16 01/09/2015 1109   ALKPHOS 99 04/13/2017 0228   ALKPHOS 110 01/09/2015 1109   BILITOT 0.5 04/13/2017 0228   BILITOT 0.32 01/09/2015 1109   GFRNONAA 47 (L) 09/19/2017 1740   GFRNONAA 85 04/18/2015 1451   GFRAA 55 (L) 09/19/2017 1740   GFRAA >89 04/18/2015 1451    Assessment: 1.  Dysphagia: Continues per the patient, increased over the past 6 months, previous EGD for suspicion of damage from radiation from past lung cancer was postponed due to new diagnosis of pericarditis 2.  Hoarseness: Intermittent with above 3.  Constipation: Patient has a bowel movement every 2 weeks with a stimulant laxative  Plan: 1.  Rescheduled patient for an EGD in the [place] with Dr. Ardis Hughs with possible dilation.  Did discuss risks, benefits, limitations and alternatives and the patient agrees to proceed. 2.  Recommend the patient hold her Eliquis for 2 days prior to time of her procedure.  We will contact her  cardiologist Dr. [name] to ensure that this is acceptable for the patient. 3.  Reviewed anti-dysphagia measures including taking small bites, chewing well and avoiding distraction while eating as well as the chin tuck technique. 4.  Discussed constipation with the patient.  Recommend that she start daily MiraLAX dosing.  Did discuss that she can increase this up to 4 times a day if necessary. 5.  Patient to follow in clinic per recommendations from Dr. Ardis Hughs after time of procedure.  Ellouise Newer, PA-C [facility] 11/27/2017, 2:09 PM

## 2017-11-27 NOTE — Patient Instructions (Signed)
You will be contacted by our office prior to your procedure for directions on holding your Eliquis.  If you do not hear from our office 1 week prior to your scheduled procedure, please call [phone] to discuss.      You have been scheduled for an endoscopy. Please follow written instructions given to you at your visit today. If you use inhalers (even only as needed), please bring them with you on the day of your procedure. Your physician has requested that you go to www.startemmi.com and enter the access code given to you at your visit today. This web site gives a general overview about your procedure. However, you should still follow specific instructions given to you by our office regarding your preparation for the procedure.   Start Miralax once daily.

## 2017-11-28 ENCOUNTER — Telehealth: Payer: Self-pay | Admitting: Emergency Medicine

## 2017-11-28 NOTE — Telephone Encounter (Signed)
Sorry, she is having an endoscopy.

## 2017-11-28 NOTE — Telephone Encounter (Signed)
Patient with diagnosis of atrial fibrillation on Eliquis for anticoagulation.    Procedure: endoscopy Date of procedure: Feb 11  CHADS2-VASc score of  5 (CHF, HTN, DM2, CAD, female)  CrCl 86.8 Platelet count 289  Per office protocol, patient can hold Eliquis for 2 days prior to procedure.   Patient will not need bridging with Lovenox (enoxaparin) around procedure.  Patient should restart Eliquis on the evening of procedure or day after, at discretion of procedure MD

## 2017-11-28 NOTE — Telephone Encounter (Signed)
   RUDY DOMEK July 30, 1956 [id number]   We have scheduled the above named patient for a procedure. Our records show that she is on anticoagulation therapy.  Please advise as to whether the patient may come off their therapy of Eliquis 2 days prior to their procedure which is scheduled for 12-01-17.  Please route your response to Tinnie Gens, CMA or fax response to [phone].  Sincerely,    [facility]

## 2017-11-28 NOTE — Telephone Encounter (Signed)
Spoke to patient and informed her to take her last dose today and hold it until her procedure and resume it per her discharge instructions. Patient verbalized understanding.

## 2017-11-28 NOTE — Progress Notes (Signed)
I agree with the above note, pplan

## 2017-12-01 ENCOUNTER — Ambulatory Visit (AMBULATORY_SURGERY_CENTER): Payer: Medicare Other | Admitting: Gastroenterology

## 2017-12-01 ENCOUNTER — Encounter: Payer: Self-pay | Admitting: Gastroenterology

## 2017-12-01 ENCOUNTER — Other Ambulatory Visit: Payer: Self-pay

## 2017-12-01 VITALS — BP 131/73 | HR 76 | Temp 98.6°F | Resp 26 | Ht 67.0 in | Wt 261.0 lb

## 2017-12-01 DIAGNOSIS — R1310 Dysphagia, unspecified: Secondary | ICD-10-CM

## 2017-12-01 DIAGNOSIS — K222 Esophageal obstruction: Secondary | ICD-10-CM

## 2017-12-01 MED ORDER — SODIUM CHLORIDE 0.9 % IV SOLN: 500.0000 mL | Freq: Once | INTRAVENOUS | Status: DC

## 2017-12-01 NOTE — Patient Instructions (Signed)
MAY START BACK ON ELIQUIS TODAY.  CHEW YOUR FOOD WELL, EAT SLOWLY AND TAKE SMALL BITES.  YOU HAD AN ENDOSCOPIC PROCEDURE TODAY AT [facility]:   Refer to the procedure report that was given to you for any specific questions about what was found during the examination.  If the procedure report does not answer your questions, please call your gastroenterologist to clarify.  If you requested that your care partner not be given the details of your procedure findings, then the procedure report has been included in a sealed envelope for you to review at your convenience later.  YOU SHOULD EXPECT: Some feelings of bloating in the abdomen. Passage of more gas than usual.  Walking can help get rid of the air that was put into your GI tract during the procedure and reduce the bloating. If you had a lower endoscopy (such as a colonoscopy or flexible sigmoidoscopy) you may notice spotting of blood in your stool or on the toilet paper. If you underwent a bowel prep for your procedure, you may not have a normal bowel movement for a few days.  Please Note:  You might notice some irritation and congestion in your nose or some drainage.  This is from the oxygen used during your procedure.  There is no need for concern and it should clear up in a day or so.  SYMPTOMS TO REPORT IMMEDIATELY:   Vomiting of blood or coffee ground material  New chest pain or pain under the shoulder blades  Painful or persistently difficult swallowing  New shortness of breath  Fever of 100F or higher  Black, tarry-looking stools  For urgent or emergent issues, a gastroenterologist can be reached at any hour by calling [phone].   DIET:  We do recommend a small meal at first, but then you may proceed to your regular diet.  Drink plenty of fluids but you should avoid alcoholic beverages for 24 hours.  ACTIVITY:  You should plan to take it easy for the rest of today and you should NOT DRIVE or use heavy  machinery until tomorrow (because of the sedation medicines used during the test).    FOLLOW UP: Our staff will call the number listed on your records the next business day following your procedure to check on you and address any questions or concerns that you may have regarding the information given to you following your procedure. If we do not reach you, we will leave a message.  However, if you are feeling well and you are not experiencing any problems, there is no need to return our call.  We will assume that you have returned to your regular daily activities without incident.  If any biopsies were taken you will be contacted by phone or by letter within the next 1-3 weeks.  Please call us at [phone] if you have not heard about the biopsies in 3 weeks.    SIGNATURES/CONFIDENTIALITY: You and/or your care partner have signed paperwork which will be entered into your electronic medical record.  These signatures attest to the fact that that the information above on your After Visit Summary has been reviewed and is understood.  Full responsibility of the confidentiality of this discharge information lies with you and/or your care-partner.

## 2017-12-01 NOTE — Op Note (Addendum)
[facility] Patient Name: [name] Procedure Date: 12/01/2017 3:21 PM MRN: [id number] Endoscopist: Milus Banister , MD Age: 62 Referring MD:  Date of Birth: [birth year] Gender: Female Account #: [id number] Procedure:                Upper GI endoscopy Indications:              Dysphagia (liquids and solids), hoarseness; weight                            gain+ Medicines:                Monitored Anesthesia Care Procedure:                Pre-Anesthesia Assessment:                           - Prior to the procedure, a History and Physical                            was performed, and patient medications and                            allergies were reviewed. The patient's tolerance of                            previous anesthesia was also reviewed. The risks                            and benefits of the procedure and the sedation                            options and risks were discussed with the patient.                            All questions were answered, and informed consent                            was obtained. Prior Anticoagulants: The patient has                            taken Eliquis (apixaban), last dose was 3 days                            prior to procedure. ASA Grade Assessment: III - A                            patient with severe systemic disease. After                            reviewing the risks and benefits, the patient was                            deemed in satisfactory condition to undergo the  procedure.                           After obtaining informed consent, the endoscope was                            passed under direct vision. Throughout the                            procedure, the patient's blood pressure, pulse, and                            oxygen saturations were monitored continuously. The                            Endoscope was introduced through the mouth, and                            advanced  to the second part of duodenum. The upper                            GI endoscopy was accomplished without difficulty.                            The patient tolerated the procedure well. Scope In: Scope Out: Findings:                 There was a 3-4cm segment of the proximal to mid                            esophagus that was tortuous and slightly narrowed                            (lumen 9mm). There were multiple small AVMs at the                            site. The mucosa was otherwise normal, no                            suggestion of neoplasia.                           The previously well documented gastric submucosal                            lesion was again noted, appeared unchanged (over 10                            year surveillance period).                           The examined duodenum was normal. Complications:            No immediate complications. Estimated blood loss:  None. Estimated Blood Loss:     Estimated blood loss: none. Impression:               - 3-4cm segment of proximal to mid esophagus that                            is tortuous, slightly narrowed. 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                           - Unchanged gastric submucosal lesion (over 10 year                            surveillance period) Recommendation:           - Patient has a contact number available for                            emergencies. The signs and symptoms of potential                            delayed complications were discussed with the                            patient. Return to normal activities tomorrow.                            Written discharge instructions were provided to the                            patient.                           - Resume previous diet.                           - Continue present medications.                           - Chew your food well, eat  slowly and take small                            bites. Milus Banister, MD 12/01/2017 3:40:58 PM This report has been signed electronically.

## 2017-12-01 NOTE — Progress Notes (Signed)
I have reviewed the patient's medical history in detail and updated the computerized patient record.

## 2017-12-01 NOTE — Progress Notes (Signed)
Report given to PACU, vss 

## 2017-12-02 ENCOUNTER — Telehealth: Payer: Self-pay | Admitting: *Deleted

## 2017-12-02 NOTE — Telephone Encounter (Signed)
  Follow up Call-  Call back number 01/10/2016  Post procedure Call Back phone  # [phone]  Permission to leave phone message Yes  Some recent data might be hidden     Patient questions:  Do you have a fever, pain , or abdominal swelling? No. Pain Score  3*  Have you tolerated food without any problems? Yes.    Have you been able to return to your normal activities? yes  Do you have any questions about your discharge instructions: Diet   No. Medications  No. Follow up visit  No.  Do you have questions or concerns about your Care? No. Patient is having some ":soreness in her throat" following the EGD but is tolerating warm broth and soup. She was instructed to let us know if this doesn't improve. SM  Actions: * If pain score is 4 or above: No action needed, pain <4.

## 2017-12-17 ENCOUNTER — Other Ambulatory Visit: Payer: Self-pay | Admitting: *Deleted

## 2017-12-17 ENCOUNTER — Encounter: Payer: Self-pay | Admitting: Cardiology

## 2017-12-17 ENCOUNTER — Ambulatory Visit: Payer: Medicare Other | Admitting: Cardiology

## 2017-12-17 VITALS — BP 130/80 | HR 80 | Ht 67.0 in | Wt 265.0 lb

## 2017-12-17 DIAGNOSIS — I319 Disease of pericardium, unspecified: Secondary | ICD-10-CM

## 2017-12-17 DIAGNOSIS — I5033 Acute on chronic diastolic (congestive) heart failure: Secondary | ICD-10-CM | POA: Diagnosis not present

## 2017-12-17 LAB — BASIC METABOLIC PANEL
BUN/Creatinine Ratio: 27 — ABNORMAL HIGH (ref 9–23)
BUN: 25 mg/dL — ABNORMAL HIGH (ref 6–24)
CO2: 25 mmol/L (ref 20–29)
Calcium: 9 mg/dL (ref 8.7–10.2)
Chloride: 107 mmol/L — ABNORMAL HIGH (ref 96–106)
Creatinine, Ser: 0.92 mg/dL (ref 0.57–1.00)
GFR calc Af Amer: 79 mL/min/{1.73_m2} (ref >59)
GFR calc non Af Amer: 68 mL/min/{1.73_m2} (ref >59)
Glucose: 145 mg/dL — ABNORMAL HIGH (ref 65–99)
Potassium: 4.8 mmol/L (ref 3.5–5.2)
Sodium: 145 mmol/L — ABNORMAL HIGH (ref 134–144)

## 2017-12-17 LAB — PRO B NATRIURETIC PEPTIDE: NT-Pro BNP: 245 pg/mL (ref 0–287)

## 2017-12-17 MED ORDER — FUROSEMIDE 40 MG PO TABS: 40.0000 mg | ORAL_TABLET | Freq: Two times a day (BID) | ORAL | 0 refills | Status: DC

## 2017-12-17 MED ORDER — SPIRONOLACTONE 25 MG PO TABS: 25.0000 mg | ORAL_TABLET | Freq: Every day | ORAL | 1 refills | Status: DC

## 2017-12-17 NOTE — Patient Instructions (Signed)
Medication Instructions:   INCREASE YOUR SPIRONOLACTONE TO 25 MG ONCE DAILY  INCREASE YOUR LASIX TO 40 MG TWICE DAILY--TAKE 1 DOSE AT 8 AM AND THE 2ND DOSE AT 2 PM EVERYDAY.    Labwork:  TODAY--BMET AND PRO-BNP     Testing/Procedures:  Your physician has requested that you have an echocardiogram. Echocardiography is a painless test that uses sound waves to create images of your heart. It provides your doctor with information about the size and shape of your heart and how well your heart's chambers and valves are working. This procedure takes approximately one hour. There are no restrictions for this procedure.    Follow-Up:  2 WEEKS WITH BRITTANY SIMMONS PA-C       If you need a refill on your cardiac medications before your next appointment, please call your pharmacy.

## 2017-12-17 NOTE — Progress Notes (Signed)
Cardiology Office Note    Date:  12/17/2017   ID:  [name], DOB 11/13/1955, MRN [id number]  PCP:  Patient, No Pcp Per  Cardiologist: Dr. [name]  Chief complain: Follow-up for pericarditis and CHF   History of Present Illness:  [name] is a 62 y.o. female with a history of CAD s/p DES to [facility] and mLAD 07/11/15, chronic pericardial effusion, DM, HTN, remote h/o treated non-small cell lung cancer, pulmonary fibrosis, aortic insufficiency, COPD, anemia and mod AI by echo 05/2015. PAF converted to NSR 08/2016 on metoprolol and eliquis for CHADSVASC=4.  Acute pericarditis 11/2016 treated with colchicine and ibuprofen. Cardiac cath 11/2016 patent stent in the proximal to mid LAD, patent stent in the mid circumflex diagonal branch small caliber vessel and jailed by the LAD stent. Severe disease in the small branch but PCI's not a good option. Small nondominant RCA and normal LV systolic function.  Last saw Dr. [name] 03/26/17 still complaining of significant positional chest pain with loud pericardial rub on physical exam and worsening dyspnea on exertion. She was worried about constrictive pericarditis and ordered a cardiac MRI. This was done 04/14/17 and showed a moderate pericardial effusion lateral to the RV free wall and posterior LV myocardium. This was not appreciated on recent echo. There was normal pericardial thickness with mild gadolinium uptake over the RV free wall and apex. There was no evidence of constriction, no evidence of myocarditis normal LVEF 59%.  Patient went to the emergency room 04/14/17 was in A. fib with RVR and spontaneously converted to normal sinus rhythm. Aspirin was stopped this admission. Echocardiogram did not demonstrate pericardial effusion. Because of her recurrent symptoms of pericarditis refractory to colchicine and NSAIDs a steroid taper over 6 weeks was ordered.  06/13/17 - Patient comes in today accompanied by her sister. She is not feeling any better. She  still gets chest pain when laying down are very short distance walking. Her shortness of breath is worsening, now even when walking around the house.  She experiences chest pain daily and steroids provided no relief.  08/04/2017 the patient underwent right cardiac catheterization in the hospital that showed elevated LVEDP consistent with volume overload and no evidence of ventricular inter dependence. She was referred for consultation to Dr. Norm Parcel at [facility] who is a transplant surgeon and performs pericardiectomy is, he repeated right-sided cardiac catheterization with same results as in [facility]. She continues to have same symptoms, two-pillow orthopnea and exertional dyspnea and chest tightness.  08/19/2017 - 2 weeks follow up, she feels significantly better with increased dose of lasix and adding spironolactone, LE edema has resolved, orthopnea and PND has resolved and chest pain has improved. She just underwent lab work today before the clinic. She is watching her diet more closely.  10/06/2017 - the patient is coming after 2 months, she feels significantly better, she is minimal lower extremity edema on and off chest pain on exertion as well as shortness of breath, she has been going to the gym, and on November 30 to go to the ER after a gym for concern of chest pain and palpitations. When she got there EKG showed normal sinus rhythm, and because there was a prolonged wait she eventually made from ER waiting room. She hasn't been to the gym since then. She continues to work, denies orthopnea paroxysmal nocturnal dyspnea. She has been compliant with her medications.  12/17/2017 -the patient is coming after 2 months, she started to feel pursuing shortness  of breath in January that has progressively worsened, at times she felt like going to the ER but didn't go because she knew she has appointment today. Lower extremity edema but her breathing is worse now she sleeps in a recliner. She has  occasional chest pain when she is resting in her morning. No palpitations no dizziness or syncope. She has been compliant with her medications. She admits that her diet is not ideal.   Past Medical History:  Diagnosis Date  . Allergy   . Anemia   . Anemia   . Aortic insufficiency    a. Mod by echo 05/2015.  . Arthritis    "knees" (07/11/2015)  . Atrial fibrillation (HCC)    On Eliquis  . Carcinoma of hilus of lung ([place]) 01/20/2012   IIIB NSCL  Right hilar mass compressing esophagus/presenting with dysphagia Rx Surgery/RT/chemo dx January 2001  . Complication of anesthesia    "I had a hard time waking me up w/one of my shoulder surgeries"  . Coronary artery disease    a. Abnormal stress test -> LHc 06/2015 s/p DES to mCX and mLAD, residual D2 disease treated medially.  . Elevated blood pressure   . Erythema nodosum   . Heart murmur   . Insomnia   . lung ca dx'd 10/1999   chemo/xrt comp 05/29/2000  . Myalgia   . Nephrolithiasis 2015  . Obesity   . Pericardial effusion    a. 05/2015 Echo: EF 55-60%, no rwma, Gr 1 DD, no effusion - but an effusion was seen on CT which was felt to be increased in size compared to 12/2014 - pericardium also thickened.   . Pericarditis    a. Dx 05/2015.  [name] Pneumothorax, spontaneous, tension 01/20/2012   October, 2010  . Pulmonary fibrosis ([place]) 01/20/2012   Due to previous surgery and chest radiation for lung cancer  . Restrictive lung disease   . Type II diabetes mellitus ([place])     Past Surgical History:  Procedure Laterality Date  . ABDOMINAL HYSTERECTOMY  2008  . APPENDECTOMY  1969?  [name] CARDIAC CATHETERIZATION N/A 07/11/2015   Procedure: Left Heart Cath and Coronary Angiography;  Surgeon: Jettie Booze, MD;  Location: [place] [facility];  Service: Cardiovascular;  Laterality: N/A;  . CARDIAC CATHETERIZATION  07/11/2015   Procedure: Coronary Stent Intervention;  Surgeon: Jettie Booze, MD;  Location: [place] [facility];  Service: Cardiovascular;;   . CARPAL TUNNEL RELEASE Bilateral 1998-2005?   right-left  . CARPAL TUNNEL RELEASE Left 2009?   "for the 2nd time"  . COLONOSCOPY    . CORONARY ANGIOPLASTY    . CYSTOSCOPY W/ STONE MANIPULATION  2013  . ESOPHAGOGASTRODUODENOSCOPY (EGD) WITH ESOPHAGEAL DILATION  2012  . FOOT SURGERY Bilateral 1980's   Callous removed   . INNER EAR SURGERY Right ~ 2009  . KNEE ARTHROPLASTY Right 1991  . KNEE ARTHROSCOPY  ~ 2000   LATERAL RELEASE  . LEFT HEART CATH AND CORONARY ANGIOGRAPHY N/A 12/18/2016   Procedure: Left Heart Cath and Coronary Angiography;  Surgeon: Burnell Blanks, MD;  Location: [place] [facility];  Service: Cardiovascular;  Laterality: N/A;  . LUNG CANCER SURGERY Left 2001   "small cell"  . LUNG SURGERY Right 2007   "reinflated it"  . PULMONARY EMBOLISM SURGERY  2007   LUNG COLLAPSE  . RIGHT AND LEFT HEART CATH N/A 06/25/2017   Procedure: RIGHT AND LEFT HEART CATH;  Surgeon: Sherren Mocha, MD;  Location: [place] [facility];  Service: Cardiovascular;  Laterality: N/A;  . SHOULDER ADHESION RELEASE Right ~ 2004  . SHOULDER ARTHROSCOPY W/ ROTATOR CUFF REPAIR Right ~ 2003    Current Medications: Current Meds  Medication Sig  . apixaban (ELIQUIS) 5 MG TABS tablet TAKE 1 TABLET BY MOUTH 2 (TWO) TIMES DAILY.  [name] B-D UF III MINI PEN NEEDLES 31G X 5 MM MISC 2 (two) times daily. as directed  . diltiazem (CARDIZEM CD) 240 MG 24 hr capsule Take 1 capsule (240 mg total) by mouth daily.  [name] HUMALOG MIX 75/25 KWIKPEN (75-25) 100 UNIT/ML Kwikpen Inject into the skin 2 (two) times daily. Inject 50 units sq in Am and 30 units subque in PM  . levothyroxine (SYNTHROID, LEVOTHROID) 50 MCG tablet Take 50 mcg by mouth daily.  [name] losartan (COZAAR) 25 MG tablet TAKE 1 TABLET (25 MG TOTAL) BY MOUTH DAILY.  . meloxicam (MOBIC) 15 MG tablet Take 1 tablet by mouth daily with breakfast.  . metoprolol tartrate (LOPRESSOR) 25 MG tablet TAKE 1 TABLET (25 MG TOTAL) BY MOUTH 2 (TWO) TIMES DAILY.  [name]  ONETOUCH VERIO test strip USE AS DIRECTED 3 TIMES A DAY  . potassium chloride SA (K-DUR,KLOR-CON) 20 MEQ tablet Take 1 tablet (20 mEq total) by mouth daily.  . rosuvastatin (CRESTOR) 5 MG tablet Take 1 tablet (5 mg total) by mouth daily.  . [DISCONTINUED] furosemide (LASIX) 40 MG tablet Take 1 tablet (40 mg total) by mouth daily.  . [DISCONTINUED] spironolactone (ALDACTONE) 25 MG tablet Take 0.5 tablets (12.5 mg total) by mouth daily.   Current Facility-Administered Medications for the 12/17/17 encounter (Office Visit) with Dorothy Spark, MD  Medication  . 0.9 %  sodium chloride infusion     Allergies:   Hydrocodone; Hydrocodone-acetaminophen; Morphine and related; Lisinopril; and Nsaids   Social History   Socioeconomic History  . Marital status: Single    Spouse name: None  . Number of children: 0  . Years of education: None  . Highest education level: None  Social Needs  . Financial resource strain: None  . Food insecurity - worry: None  . Food insecurity - inability: None  . Transportation needs - medical: None  . Transportation needs - non-medical: None  Occupational History  . Occupation: RETIRED  Tobacco Use  . Smoking status: Former Smoker    Packs/day: 0.25    Years: 5.00    Pack years: 1.25    Types: Cigarettes    Last attempt to quit: 07/22/1999    Years since quitting: 18.4  . Smokeless tobacco: Never Used  Substance and Sexual Activity  . Alcohol use: No    Alcohol/week: 0.0 oz  . Drug use: No  . Sexual activity: Not Currently    Birth control/protection: Other-see comments    Comment: hysterectomy  Other Topics Concern  . None  Social History Narrative  . None     Family History:  The patient's family history includes Diabetes in her father, maternal grandmother, mother, paternal grandmother, sister, sister, sister, and sister; Heart disease in her father; Hypertension in her sister; Kidney disease in her sister; Pancreatic cancer in her mother.    ROS:   Please see the history of present illness.    Review of Systems  Constitution: Negative.  HENT: Negative.   Eyes: Negative.   Cardiovascular: Positive for chest pain, dyspnea on exertion and palpitations.  Respiratory: Negative.   Hematologic/Lymphatic: Negative.   Musculoskeletal: Negative.  Negative for joint pain.  Gastrointestinal: Negative.  Genitourinary: Negative.   Neurological: Negative.    All other systems reviewed and are negative.   PHYSICAL EXAM:   VS:  BP 130/80 (BP Location: Left Arm, Patient Position: Sitting, Cuff Size: Large)   Pulse 80   Ht 5\' 7"  (1.702 m)   Wt 265 lb (120.2 kg)   SpO2 95%   BMI 41.50 kg/m   Physical Exam  GEN: Well nourished, well developed, in no acute distress  Neck: no JVD, carotid bruits, or masses Cardiac:RRR; Loud 3/6 rub in systole and diastole Respiratory:  clear to auscultation bilaterally, normal work of breathing GI: soft, nontender, nondistended, + BS Ext: without cyanosis, clubbing, or edema, Good distal pulses bilaterally Neuro:  Alert and Oriented x 3 Psych: euthymic mood, full affect  Wt Readings from Last 3 Encounters:  12/17/17 265 lb (120.2 kg)  12/01/17 261 lb (118.4 kg)  11/27/17 261 lb (118.4 kg)      Studies/Labs Reviewed:   EKG:  EKG is  ordered today.  The ekg ordered today demonstrates Normal sinus rhythm with ST elevation throughout  Recent Labs: 04/12/2017: B Natriuretic Peptide 59.8; TSH 2.776 04/13/2017: ALT 22 08/19/2017: NT-Pro BNP 136 09/19/2017: BUN 27; Creatinine, Ser 1.23; Hemoglobin 10.6; Platelets 289; Potassium 4.4; Sodium 136   Lipid Panel    Component Value Date/Time   CHOL 104 (L) 08/04/2015 1127   TRIG 74 08/04/2015 1127   HDL 47 08/04/2015 1127   CHOLHDL 2.2 08/04/2015 1127   VLDL 15 08/04/2015 1127   LDLCALC 42 08/04/2015 1127    Additional studies/ records that were reviewed today include:   Cardiac MRI 04/14/2017  1) Moderate pericardial effusion lateral to  the RV free wall and posterior LV myocardium. Note this is not appreciated on recent echo   2) Normal pericardial thickness with mild gadolinium uptake over the RV free wall and apex on 3 chamber view   3) No evidence of constriction although MRI especially with no free breathing sequences not ideal to assess Pericardium normal thickness and no ventricular inter dependence   4) Normal LV EF 59% (EDV 105 ESV 43 SV 62 cc)   5) No evidence of myocarditis or LV myocardial uptake of gadolinium on delayed inversion recovery sequences   6) Tri leaflet aortic valve with AR   7) Mild LAE   Images were also reviewed by Dr Loralie Champagne who agreed with above findings   TTE: 04/13/17   Study Conclusions   - Left ventricle: The cavity size was normal. Wall thickness was   normal. Systolic function was normal. The estimated ejection   fraction was in the range of 55% to 60%. - Aortic valve: There was moderate regurgitation. Valve area (VTI):   1.99 cm^2. Valve area (Vmax): 2.04 cm^2. Valve area (Vmean): 1.95   cm^2. - Atrial septum: No defect or patent foramen ovale was identified. _____________ Nuclear stress test: 11/29/2015  Nuclear stress EF: 62%.  There is a small defect of mild severity present in the mid anteroseptal location. The defect is non-reversible and consistent with breast attenuation artifact vs. prior infarct. No ischemia noted.  This is a low risk study.  The left ventricular ejection fraction is normal (55-65%).  Compared to study of 2016, there is now normal perfusion in the apical anterior and apical myocardium and the defect in the mid anteroseptum is fixed.   TTE: 09/18/2016   - Left ventricle: The cavity size was normal. Wall thickness was   normal. Systolic function was normal. The  estimated ejection   fraction was in the range of 55% to 60%. Wall motion was normal;   there were no regional wall motion abnormalities. Doppler   parameters are consistent  with abnormal left ventricular   relaxation (grade 1 diastolic dysfunction). - Aortic valve: There was no stenosis. There was mild to moderate   regurgitation. - Mitral valve: There was trivial regurgitation. - Right ventricle: The cavity size was normal. Systolic function   was normal. - Right atrium: The atrium was mildly dilated. - Pulmonic valve: Turbulence across the pulmonic valve but peak   gradient 15 mmHg does not suggest significant stenosis. There was   mild regurgitation. Left atrium:  The atrium was normal in size. - Pulmonary arteries: PA peak pressure: 28 mm Hg (S). - Inferior vena cava: The vessel was normal in size. The   respirophasic diameter changes were in the normal range (>= 50%),   consistent with normal central venous pressure. - Pericardium, extracardiac: Small pericardial effusion, no   tamponade.   Impressions: - Normal LV size with EF 55-60%. Normal RV size and systolic   function. Mild to moderate aortic insufficiency. Turbulence   across the pulmonic valve but no significant stenosis by doppler   measurement.   LHC 11/2016 1. Double vessel CAD, stable.  2. Patent stent in the proximal to mid LAD with no restenosis 3. Patent stent in the mid Circumflex with no restenosis 4. The Diagonal branch is a small caliber vessel that is jailed by the LAD stent. There is severe disease in this small branch but PCI is not a good option 5. Small non-dominant RCA 6. Normal LV systolic function   Recommendations: Continue medical management of CAD.   EKG performed today 12/17/2017 shows normal sinus rhythm normal EKG, this was personally reviewed.   ASSESSMENT:    1. Acute on chronic diastolic CHF (congestive heart failure), NYHA class 3 ([place])   2. Chronic pericarditis, unspecified complication status, unspecified type    PLAN:  In order of problems listed above:  1. Acute on chronic diastolic CHF -  - weight up 4 lbs since 11/27/2017 and 19#'s December.   - Increase Lasix to 40 mg by mouth twice a day as per October 25 milligrams daily.  - Obtain BMP and BNP today  - importance of low sodium and healthy diet stressed out.  2. Chronic pericarditis with significant rub in systole and diastole on physical exam, despite prolonged repeated treatment with NSAIDS, Colchicine and steroids.  The patient has worsening symptoms suspicious for constrictive pericarditis, however right-sided cath in [facility] was negative. This was discussed with Dr. Norm Parcel, she has improved with CHF therapy no need to follow-up with him. However now worsening symptoms - We will repeat echocardiogram to evaluate for possible ulcerative her carditis.  3. CAD s/p DES to [facility] and mLAD 07/11/15 stable without angina no restenosis on cath in 11/2016, EKG today normal and unchanged from prior.  4. Atrial fibrillation with RVR converted to normal sinus rhythm and maintaining normal sinus rhythm on Cardizem 240 mg daily metoprolol 25 mg twice a day.   5. Hypertension blood pressure - controlled.  Follow-up in 2 weeks.  Medication Adjustments/Labs and Tests Ordered: Current medicines are reviewed at length with the patient today.  Concerns regarding medicines are outlined above.  Medication changes, Labs and Tests ordered today are listed in the Patient Instructions below. Patient Instructions  Medication Instructions:   INCREASE YOUR SPIRONOLACTONE TO 25 MG  ONCE DAILY  INCREASE YOUR LASIX TO 40 MG TWICE DAILY--TAKE 1 DOSE AT 8 AM AND THE 2ND DOSE AT 2 PM EVERYDAY.    Labwork:  TODAY--BMET AND PRO-BNP     Testing/Procedures:  Your physician has requested that you have an echocardiogram. Echocardiography is a painless test that uses sound waves to create images of your heart. It provides your doctor with information about the size and shape of your heart and how well your heart's chambers and valves are working. This procedure takes approximately one  hour. There are no restrictions for this procedure.    Follow-Up:  2 WEEKS WITH BRITTANY SIMMONS PA-C       If you need a refill on your cardiac medications before your next appointment, please call your pharmacy.      Signed, Ena Dawley, MD  12/17/2017 9:15 AM    [address], [place], [place]  [address] Phone: [phone]; Fax: [phone]

## 2017-12-25 ENCOUNTER — Encounter: Payer: Self-pay | Admitting: Pulmonary Disease

## 2017-12-25 ENCOUNTER — Ambulatory Visit: Payer: Medicare Other | Admitting: Pulmonary Disease

## 2017-12-25 DIAGNOSIS — J984 Other disorders of lung: Secondary | ICD-10-CM

## 2017-12-25 DIAGNOSIS — I311 Chronic constrictive pericarditis: Secondary | ICD-10-CM | POA: Diagnosis not present

## 2017-12-25 NOTE — Assessment & Plan Note (Addendum)
Related to prior lobectomy She has been stable now from a lung standpoint for many years. No risk factors for embolism to investigate as cause of dyspnea-also on apixaban so low clinical probability

## 2017-12-25 NOTE — Patient Instructions (Signed)
Call as needed

## 2017-12-25 NOTE — Progress Notes (Signed)
   Subjective:    Patient ID: [name], female    DOB: 1956-04-09, 62 y.o.   MRN: [id number]  HPI  61/ F, with dyspnea is attributed to restrictive lung disease  s/p LUlobectomy for spont pneumothx in 07/2006, s/p chemo/ XRT for stg III B lung cancer in 2001 - in remission since!   She is followed by cardiology for chronic pericarditis since 2016, has received NSAIDs, colchicine and steroids, was referred to [facility]. She underwent a cardiac cath 06/2015 required 2 stents, repeat 06/2017 Last echo shows moderate aortic regurgitation She underwent EGD 11/2017 which showed mild narrowing of the mid esophagus without mass. Chest x-ray 08/2017 was reviewed which shows volume loss in left, chronic and no new infiltrates or effusions. Her weight is unchanged at 250 pounds  She remains dyspneic and has intermittent chest pain  She desaturated from 96% to 92% on the third lap  Significant tests/ events   PFTs in 11/2006 and spirometry in 06/2008 consistent with moderate restrictive lung disease. Lung volumes were decreased as was diffusion capacity, but this corrected for alveolar volume.   PFT 10/2009 showed FEV1 51% &FVC 47%, DLCO was 47% but corrected for alveolar volume   01/2014 CPET >> decreased exercise tolerance, peak VO2 56% of predicted, no ventilatory limitation, no cardiac limitation-O2 pulse 73% predicted, preexercise PFTs-FVC 51%, FEV1 54%, ratio 84, MVV 65%  CT chest 05/2015 >> postop changes in both lungs, no evidence of tumor recurrence.  Review of Systems Patient denies significant dyspnea,cough, hemoptysis,  chest pain, palpitations, pedal edema, orthopnea, paroxysmal nocturnal dyspnea, lightheadedness, nausea, vomiting, abdominal or  leg pains       Objective:   Physical Exam   Gen. Pleasant, obese, in no distress ENT - no lesions, no post nasal drip Neck: No JVD, no thyromegaly, no carotid bruits Lungs: no use of accessory muscles, no dullness to percussion,  decreased without rales or rhonchi  Cardiovascular: Rhythm regular, heart sounds  normal, no murmurs or gallops, no peripheral edema Musculoskeletal: No deformities, no cyanosis or clubbing , no tremors        Assessment & Plan:

## 2017-12-25 NOTE — Assessment & Plan Note (Signed)
Follows with cardiology 

## 2017-12-29 ENCOUNTER — Ambulatory Visit: Payer: Medicare Other | Admitting: Pulmonary Disease

## 2017-12-30 ENCOUNTER — Other Ambulatory Visit: Payer: Self-pay

## 2017-12-30 ENCOUNTER — Ambulatory Visit (HOSPITAL_COMMUNITY): Payer: Medicare Other | Attending: Cardiology

## 2017-12-30 DIAGNOSIS — I4891 Unspecified atrial fibrillation: Secondary | ICD-10-CM | POA: Insufficient documentation

## 2017-12-30 DIAGNOSIS — Z8249 Family history of ischemic heart disease and other diseases of the circulatory system: Secondary | ICD-10-CM | POA: Insufficient documentation

## 2017-12-30 DIAGNOSIS — I313 Pericardial effusion (noninflammatory): Secondary | ICD-10-CM | POA: Diagnosis not present

## 2017-12-30 DIAGNOSIS — I351 Nonrheumatic aortic (valve) insufficiency: Secondary | ICD-10-CM | POA: Insufficient documentation

## 2017-12-30 DIAGNOSIS — I319 Disease of pericardium, unspecified: Secondary | ICD-10-CM

## 2017-12-30 DIAGNOSIS — I5189 Other ill-defined heart diseases: Secondary | ICD-10-CM | POA: Diagnosis not present

## 2017-12-30 DIAGNOSIS — Z87891 Personal history of nicotine dependence: Secondary | ICD-10-CM | POA: Diagnosis not present

## 2017-12-30 DIAGNOSIS — I5033 Acute on chronic diastolic (congestive) heart failure: Secondary | ICD-10-CM | POA: Diagnosis present

## 2017-12-30 DIAGNOSIS — J8410 Pulmonary fibrosis, unspecified: Secondary | ICD-10-CM | POA: Insufficient documentation

## 2017-12-30 DIAGNOSIS — E669 Obesity, unspecified: Secondary | ICD-10-CM | POA: Insufficient documentation

## 2017-12-30 DIAGNOSIS — Z6839 Body mass index (BMI) 39.0-39.9, adult: Secondary | ICD-10-CM | POA: Insufficient documentation

## 2017-12-30 DIAGNOSIS — E119 Type 2 diabetes mellitus without complications: Secondary | ICD-10-CM | POA: Diagnosis not present

## 2018-01-02 ENCOUNTER — Telehealth: Payer: Self-pay | Admitting: Cardiology

## 2018-01-02 ENCOUNTER — Encounter: Payer: Self-pay | Admitting: Cardiology

## 2018-01-02 ENCOUNTER — Ambulatory Visit: Payer: Medicare Other | Admitting: Cardiology

## 2018-01-02 VITALS — BP 120/68 | HR 67 | Ht 67.0 in | Wt 257.0 lb

## 2018-01-02 DIAGNOSIS — R0600 Dyspnea, unspecified: Secondary | ICD-10-CM

## 2018-01-02 LAB — BASIC METABOLIC PANEL
BUN/Creatinine Ratio: 26 — ABNORMAL HIGH (ref 9–23)
BUN: 35 mg/dL — ABNORMAL HIGH (ref 6–24)
CO2: 29 mmol/L (ref 20–29)
Calcium: 9.3 mg/dL (ref 8.7–10.2)
Chloride: 104 mmol/L (ref 96–106)
Creatinine, Ser: 1.33 mg/dL — ABNORMAL HIGH (ref 0.57–1.00)
GFR calc Af Amer: 50 mL/min/{1.73_m2} — ABNORMAL LOW (ref >59)
GFR calc non Af Amer: 44 mL/min/{1.73_m2} — ABNORMAL LOW (ref >59)
Glucose: 37 mg/dL — CL (ref 65–99)
Potassium: 4.4 mmol/L (ref 3.5–5.2)
Sodium: 144 mmol/L (ref 134–144)

## 2018-01-02 LAB — PRO B NATRIURETIC PEPTIDE: NT-Pro BNP: 156 pg/mL (ref 0–287)

## 2018-01-02 MED ORDER — NITROGLYCERIN 0.4 MG SL SUBL: 0.4000 mg | SUBLINGUAL_TABLET | SUBLINGUAL | 3 refills | Status: DC | PRN

## 2018-01-02 NOTE — Telephone Encounter (Signed)
New message    Critical labs

## 2018-01-02 NOTE — Progress Notes (Signed)
01/02/2018 [name]   17-Dec-1955  [id number]  Primary Physician Patient, No Pcp Per Primary Cardiologist: Dr. [name]   Reason for Visit/CC: f/u for dyspnea and LEE  HPI:   [name], is a 62 y.o.femalewith a history of CAD s/p DES to [facility] and mLAD 07/11/15 (patent stents on repeat cath 11/2016), chronic pericardial effusion, h/o acute pericarditis, PAF on Eliquis, DM, HTN, remote h/o treated non-small cell lung cancer, pulmonary fibrosis, aortic insufficiency, COPD and anemia.   She was recently seen by Dr. [name] on December 17, 2017.  At that visit she endorsed exertional dyspnea that had progressively worsened over the course of several weeks.  She had also noted lower extremity edema as well as orthopnea/PND, needing to sleep in her recliner.  She denied palpitations, dizziness and syncope.  No anginal chest pain.  Dr. [name] felt that this was likely secondary to acute on chronic diastolic heart failure.  She instructed her, prior to leaving the office to increase Lasix to 40 mg twice daily.  She did obtain a BMP as well as a BNP that day.  BNP was actually within normal limits at 245 (RR 0-287). However it was felt that this may have been falsely low in the setting of the patient's obesity.  Therefore Dr. [name] recommended that the patient continue on the increased dose of Lasix, 40 mg twice daily.  Metabolic panel had showed normal renal function and normal potassium.  Dr. [name] also ordered a repeat echocardiogram to rule out the possibility of recurrent restrictive pericarditis, pericardial effusion as well as to reassess left ventricular ejection fraction as well as the status of her aortic valve.  Left bentricular EF was normal.  Only minimal effusion was noted.  There are no signs of constrictive pericarditis.  Her aortic insufficiency was also noted to be mild.  She presents back today for follow-up. Pt notes that she feels better. Breathing and LEE has improved, with the higher  dose of lasix, however she still has some occasional mild exertional dyspnea with certain activities at the gym, such as exercising on the treadmill and eliptical. Improves when she rest. No issues with basic ADLs. No chest pain. BP is well controlled at 120/68.  Pt notes that she was previously on Lasix BID in the past and was doing well but dose was reduced down to once daily about a year ago during a hospitalaization for atrial fibrillation. She felt that her edema was better controlled with BID dosing.   Current Meds  Medication Sig  . apixaban (ELIQUIS) 5 MG TABS tablet TAKE 1 TABLET BY MOUTH 2 (TWO) TIMES DAILY.  [name] B-D UF III MINI PEN NEEDLES 31G X 5 MM MISC 2 (two) times daily. as directed  . diltiazem (CARDIZEM CD) 240 MG 24 hr capsule Take 1 capsule (240 mg total) by mouth daily.  . furosemide (LASIX) 40 MG tablet Take 1 tablet (40 mg total) by mouth 2 (two) times daily.  [name] HUMALOG MIX 75/25 KWIKPEN (75-25) 100 UNIT/ML Kwikpen Inject into the skin 2 (two) times daily. Inject 50 units sq in Am and 30 units subque in PM  . levothyroxine (SYNTHROID, LEVOTHROID) 50 MCG tablet Take 50 mcg by mouth daily.  [name] losartan (COZAAR) 25 MG tablet TAKE 1 TABLET (25 MG TOTAL) BY MOUTH DAILY.  . meloxicam (MOBIC) 15 MG tablet Take 1 tablet by mouth daily with breakfast.  . metoprolol tartrate (LOPRESSOR) 25 MG tablet TAKE 1 TABLET (25 MG TOTAL) BY MOUTH  2 (TWO) TIMES DAILY.  [name] ONETOUCH VERIO test strip USE AS DIRECTED 3 TIMES A DAY  . potassium chloride SA (K-DUR,KLOR-CON) 20 MEQ tablet Take 1 tablet (20 mEq total) by mouth daily.  . rosuvastatin (CRESTOR) 5 MG tablet Take 1 tablet (5 mg total) by mouth daily.  [name] spironolactone (ALDACTONE) 25 MG tablet Take 1 tablet (25 mg total) by mouth daily.   Current Facility-Administered Medications for the 01/02/18 encounter (Office Visit) with Consuelo Pandy, PA-C  Medication  . 0.9 %  sodium chloride infusion   Allergies  Allergen Reactions  .  Hydrocodone Hives and Itching  . Hydrocodone-Acetaminophen Hives  . Morphine And Related Other (See Comments)    GI PROBLEMS  . Lisinopril Other (See Comments) and Cough    Cough   . Nsaids Other (See Comments)    GI PROBLEMS   Past Medical History:  Diagnosis Date  . Allergy   . Anemia   . Anemia   . Aortic insufficiency    a. Mod by echo 05/2015.  . Arthritis    "knees" (07/11/2015)  . Atrial fibrillation (HCC)    On Eliquis  . Carcinoma of hilus of lung ([place]) 01/20/2012   IIIB NSCL  Right hilar mass compressing esophagus/presenting with dysphagia Rx Surgery/RT/chemo dx January 2001  . Complication of anesthesia    "I had a hard time waking me up w/one of my shoulder surgeries"  . Coronary artery disease    a. Abnormal stress test -> LHc 06/2015 s/p DES to mCX and mLAD, residual D2 disease treated medially.  . Elevated blood pressure   . Erythema nodosum   . Heart murmur   . Insomnia   . lung ca dx'd 10/1999   chemo/xrt comp 05/29/2000  . Myalgia   . Nephrolithiasis 2015  . Obesity   . Pericardial effusion    a. 05/2015 Echo: EF 55-60%, no rwma, Gr 1 DD, no effusion - but an effusion was seen on CT which was felt to be increased in size compared to 12/2014 - pericardium also thickened.   . Pericarditis    a. Dx 05/2015.  [name] Pneumothorax, spontaneous, tension 01/20/2012   October, 2010  . Pulmonary fibrosis ([place]) 01/20/2012   Due to previous surgery and chest radiation for lung cancer  . Restrictive lung disease   . Type II diabetes mellitus (HCC)    Family History  Problem Relation Age of Onset  . Heart disease Father   . Diabetes Father   . Pancreatic cancer Mother   . Diabetes Mother   . Hypertension Sister        x 3  . Diabetes Sister        x 4  . Diabetes Maternal Grandmother   . Diabetes Paternal Grandmother   . Diabetes Sister   . Diabetes Sister   . Diabetes Sister   . Kidney disease Sister   . Colon cancer Neg Hx   . Colon polyps Neg Hx   . Rectal cancer  Neg Hx   . Stomach cancer Neg Hx   . Esophageal cancer Neg Hx   . Breast cancer Neg Hx    Past Surgical History:  Procedure Laterality Date  . ABDOMINAL HYSTERECTOMY  2008  . APPENDECTOMY  1969?  [name] CARDIAC CATHETERIZATION N/A 07/11/2015   Procedure: Left Heart Cath and Coronary Angiography;  Surgeon: Jettie Booze, MD;  Location: [facility];  Service: Cardiovascular;  Laterality: N/A;  . CARDIAC CATHETERIZATION  07/11/2015  Procedure: Coronary Stent Intervention;  Surgeon: Jettie Booze, MD;  Location: [facility];  Service: Cardiovascular;;  . CARPAL TUNNEL RELEASE Bilateral 1998-2005?   right-left  . CARPAL TUNNEL RELEASE Left 2009?   "for the 2nd time"  . COLONOSCOPY    . CORONARY ANGIOPLASTY    . CYSTOSCOPY W/ STONE MANIPULATION  2013  . ESOPHAGOGASTRODUODENOSCOPY (EGD) WITH ESOPHAGEAL DILATION  2012  . FOOT SURGERY Bilateral 1980's   Callous removed   . INNER EAR SURGERY Right ~ 2009  . KNEE ARTHROPLASTY Right 1991  . KNEE ARTHROSCOPY  ~ 2000   LATERAL RELEASE  . LEFT HEART CATH AND CORONARY ANGIOGRAPHY N/A 12/18/2016   Procedure: Left Heart Cath and Coronary Angiography;  Surgeon: Burnell Blanks, MD;  Location: [facility];  Service: Cardiovascular;  Laterality: N/A;  . LUNG CANCER SURGERY Left 2001   "small cell"  . LUNG SURGERY Right 2007   "reinflated it"  . PULMONARY EMBOLISM SURGERY  2007   LUNG COLLAPSE  . RIGHT AND LEFT HEART CATH N/A 06/25/2017   Procedure: RIGHT AND LEFT HEART CATH;  Surgeon: Sherren Mocha, MD;  Location: [facility];  Service: Cardiovascular;  Laterality: N/A;  . SHOULDER ADHESION RELEASE Right ~ 2004  . SHOULDER ARTHROSCOPY W/ ROTATOR CUFF REPAIR Right ~ 2003   Social History   Socioeconomic History  . Marital status: Single    Spouse name: Not on file  . Number of children: 0  . Years of education: Not on file  . Highest education level: Not on file  Social Needs  . Financial resource  strain: Not on file  . Food insecurity - worry: Not on file  . Food insecurity - inability: Not on file  . Transportation needs - medical: Not on file  . Transportation needs - non-medical: Not on file  Occupational History  . Occupation: RETIRED  Tobacco Use  . Smoking status: Former Smoker    Packs/day: 0.25    Years: 5.00    Pack years: 1.25    Types: Cigarettes    Last attempt to quit: 07/22/1999    Years since quitting: 18.4  . Smokeless tobacco: Never Used  Substance and Sexual Activity  . Alcohol use: No    Alcohol/week: 0.0 oz  . Drug use: No  . Sexual activity: Not Currently    Birth control/protection: Other-see comments    Comment: hysterectomy  Other Topics Concern  . Not on file  Social History Narrative  . Not on file     Review of Systems: General: negative for chills, fever, night sweats or weight changes.  Cardiovascular: negative for chest pain, dyspnea on exertion, edema, orthopnea, palpitations, paroxysmal nocturnal dyspnea or shortness of breath Dermatological: negative for rash Respiratory: negative for cough or wheezing Urologic: negative for hematuria Abdominal: negative for nausea, vomiting, diarrhea, bright red blood per rectum, melena, or hematemesis Neurologic: negative for visual changes, syncope, or dizziness All other systems reviewed and are otherwise negative except as noted above.   Physical Exam:  Blood pressure 120/68, pulse 67, height 5\' 7"  (1.702 m), weight 257 lb (116.6 kg), SpO2 98 %.  General appearance: alert, cooperative and no distress Neck: no carotid bruit, no JVD and supple, symmetrical, trachea midline Lungs: clear to auscultation bilaterally Heart: regular rate and rhythm, S1, S2 normal, no murmur, click, rub or gallop Extremities: extremities normal, atraumatic, no cyanosis or edema Pulses: 2+ and symmetric Skin: Skin color, texture, turgor normal. No rashes or  lesions Neurologic: Grossly normal  EKG not preformed --  personally reviewed   ASSESSMENT AND PLAN:   1. Acute on Chronic Diastolic CHF: Symptoms improved on higher dose of Lasix, at 40 mg twice daily.  Her lower extremity edema has improved and as well as her dyspnea.  Pressure stable on the higher dose of Lasix.  Repeat basic metabolic panel today to ensure that renal function and potassium levels are stable.  If levels are stable we will plan to keep her on twice daily dosing as she historically tends to do better with this.  2.  Aortic insufficiency: Echo showed only mild AI.  3.  HTN: Controlled on current regimen.  4. CAD: h/o DES to [facility] and mLAD 07/11/15 stable without angina no restenosis on cath in 11/2016. No recent CP.   5. PAF: RRR on exam. HR is controlled. She is on Eliquis for a/c. Denies palpitations.   6. H/o pericarditis/ pericardial effusion: recent echo showed no signs constrictive pericarditis. Only minimal effusion.  Follow-Up w/ Dr. [name] in 6 months, or sooner if needed.   Brittainy Ladoris Gene, MHS [facility] 01/02/2018 9:02 AM

## 2018-01-02 NOTE — Patient Instructions (Signed)
Medication Instructions:   START TAKING NITROGLYCERIN  0.4 SUBLINGUAL FOR CHEST PAIN AS NEEDED .   If you need a refill on your cardiac medications before your next appointment, please call your pharmacy.  Labwork:  BMET AND BNP TODAY    Testing/Procedures: NONE ORDERED  TODAY     Follow-Up:  WITH [facility] IN 4 TO 6 MONTHS    Any Other Special Instructions Will Be Listed Below (If Applicable).

## 2018-01-02 NOTE — Telephone Encounter (Signed)
Glucose was 37 per pt ws fasting this am and after lab was done felt funny on elevator had to get a drink feels fine now .Adonis Housekeeper

## 2018-01-05 ENCOUNTER — Telehealth: Payer: Self-pay | Admitting: *Deleted

## 2018-01-05 DIAGNOSIS — Z79899 Other long term (current) drug therapy: Secondary | ICD-10-CM

## 2018-01-05 NOTE — Telephone Encounter (Signed)
-----   Message from Consuelo Pandy, [place] sent at 01/02/2018  5:03 PM EDT ----- I was notified by Triage RN regarding low glucose of 37. RN called pt to check on her. Pt was feeling well and had just eaten. No symptoms of hypoglycemia. Pt reported that she had skipped breakfast prior to her appt earlier today. Pt advised to monitor BG levels closely with home with glucometer.  In regards to renal function. There was a slight increase in SCr from 0.92>>1.33 with increased dose of Lasix to 40 mg BID. K is WNL. We should have pt get repeat BMP again in 1 week to ensure kidney function remains stable and not change any further. Please call to notify and have pt come back for repeat labs.

## 2018-01-09 ENCOUNTER — Other Ambulatory Visit: Payer: Medicare Other

## 2018-01-09 DIAGNOSIS — Z79899 Other long term (current) drug therapy: Secondary | ICD-10-CM

## 2018-01-09 LAB — BASIC METABOLIC PANEL
BUN/Creatinine Ratio: 18 (ref 9–23)
BUN: 21 mg/dL (ref 6–24)
CO2: 25 mmol/L (ref 20–29)
Calcium: 9 mg/dL (ref 8.7–10.2)
Chloride: 105 mmol/L (ref 96–106)
Creatinine, Ser: 1.15 mg/dL — ABNORMAL HIGH (ref 0.57–1.00)
GFR calc Af Amer: 60 mL/min/{1.73_m2} (ref >59)
GFR calc non Af Amer: 52 mL/min/{1.73_m2} — ABNORMAL LOW (ref >59)
Glucose: 69 mg/dL (ref 65–99)
Potassium: 4.6 mmol/L (ref 3.5–5.2)
Sodium: 144 mmol/L (ref 134–144)

## 2018-01-19 ENCOUNTER — Telehealth: Payer: Self-pay | Admitting: Cardiology

## 2018-01-19 MED ORDER — PRAVASTATIN SODIUM 20 MG PO TABS: 20.0000 mg | ORAL_TABLET | Freq: Every evening | ORAL | 0 refills | Status: DC

## 2018-01-19 MED ORDER — COENZYME Q10 10 MG PO CAPS: 10.0000 mg | ORAL_CAPSULE | Freq: Every day | ORAL | 3 refills | Status: DC

## 2018-01-19 NOTE — Telephone Encounter (Signed)
New Message   Pt c/o medication issue:  1. Name of Medication: Patient is not sure of the name she just says its for calcium  2. How are you currently taking this medication (dosage and times per day)?   3. Are you having a reaction (difficulty breathing--STAT)?  4. What is your medication issue? Patient says that she has been having shoulders and thighs. She states that its so bad that she had to use crutches this weekend.  As well as in her right hand her fingers are tingling.

## 2018-01-19 NOTE — Telephone Encounter (Signed)
Please switch her to pravastatin 20 mg po daily and have her take it with OTC coenzyme Q10. Thank you, KN

## 2018-01-19 NOTE — Telephone Encounter (Signed)
Spoke with the pt and informed her that per Dr [name], she recommends that we stop her Rosuvastatin, and start her on Pravastatin 20 mg po daily, as well as OTC Coenzyme Q-10 10 mg po daily.  Confirmed the pharmacy of choice with the pt. Informed the pt that I will send in a months supply of her statin, to see if she tolerates this appropriately or not. Advised the pt that if she is tolerating this med well, then she should call the office for further refills.  Informed the pt that I will update Rosuvastatin as an intolerance in her allergies. Pt verbalized understanding and agrees with this plan.

## 2018-01-19 NOTE — Telephone Encounter (Signed)
Pt is calling in with complaints of bilateral leg cramps and aching.  Pt states that she thinks its her statin causing the issue.  Pt states its only in her lower extremities.  Pt states that its consistent with the aching and cramping in her lower extremities.  Pt reports she takes her statin daily at bedtime. Pt states the only med that has been added to her regimen lately is KDUR, but pt states that should help my cramps vs make them worse.  Pt would like for Dr [name] to advise on what she should do about this situation.  Informed the pt that Dr [name] is out of the office at this time, but I will route this message to her for further review and recommendation, and follow-up with the pt thereafter. Pt verbalized understanding and agrees with this plan.

## 2018-01-19 NOTE — Telephone Encounter (Signed)
Left the pt a message to call back.

## 2018-01-26 ENCOUNTER — Other Ambulatory Visit: Payer: Self-pay | Admitting: Cardiology

## 2018-01-26 DIAGNOSIS — I5033 Acute on chronic diastolic (congestive) heart failure: Secondary | ICD-10-CM

## 2018-01-26 DIAGNOSIS — I2510 Atherosclerotic heart disease of native coronary artery without angina pectoris: Secondary | ICD-10-CM

## 2018-01-26 DIAGNOSIS — I10 Essential (primary) hypertension: Secondary | ICD-10-CM

## 2018-01-26 DIAGNOSIS — Z9861 Coronary angioplasty status: Secondary | ICD-10-CM

## 2018-01-26 DIAGNOSIS — I4891 Unspecified atrial fibrillation: Secondary | ICD-10-CM

## 2018-01-26 DIAGNOSIS — I319 Disease of pericardium, unspecified: Secondary | ICD-10-CM

## 2018-01-27 ENCOUNTER — Other Ambulatory Visit: Payer: Self-pay | Admitting: *Deleted

## 2018-01-27 ENCOUNTER — Telehealth: Payer: Self-pay | Admitting: *Deleted

## 2018-01-27 MED ORDER — COENZYME Q10 50 MG PO CAPS: 50.0000 mg | ORAL_CAPSULE | Freq: Every day | ORAL | 3 refills | Status: DC

## 2018-01-27 NOTE — Telephone Encounter (Signed)
Med list updated, rx sent in to requesting pharmacy.

## 2018-01-27 NOTE — Telephone Encounter (Signed)
Received fax from [facility] requesting an alternative for the CO Q-10 as the supplier does not have the 10 mg for this supplement. 50 mg and 100 mg are readily available and they would like to know if one of those doses would be appropriate. Please advise. Thanks, MI

## 2018-01-27 NOTE — Telephone Encounter (Signed)
50 mg would be ok

## 2018-02-13 ENCOUNTER — Ambulatory Visit (HOSPITAL_COMMUNITY)
Admission: EM | Admit: 2018-02-13 | Discharge: 2018-02-13 | Disposition: A | Discharge status: Home / Self Care | Payer: Medicare Other | Attending: Family Medicine | Admitting: Family Medicine

## 2018-02-13 ENCOUNTER — Encounter (HOSPITAL_COMMUNITY): Payer: Self-pay | Admitting: Emergency Medicine

## 2018-02-13 DIAGNOSIS — R1310 Dysphagia, unspecified: Secondary | ICD-10-CM | POA: Diagnosis not present

## 2018-02-13 NOTE — ED Triage Notes (Signed)
Pt c/o "when I eat something, it feels like something is getting hung in my throat for two weeks".

## 2018-02-13 NOTE — ED Provider Notes (Signed)
[place]    CSN: [id number] Arrival date & time: 02/13/18  1059     History   Chief Complaint Chief Complaint  Patient presents with  . Sore Throat    HPI [name] is a 62 y.o. female.   [name] presents with complaints of feeling that there is something to her throat, worse to right of throat than left, which has been ongoing for approximately 2 weeks. Worse with swallowing and eating. Feels that she has a hard time swallowing at times, like today felt her pills did not go down very well. Voice hoarseness has been intermittent. Quit smoking in 2000, was treated with chemo for lung ca with lesion to upper stomach. Saw GI in February of this year with upper endoscopy completed due to similar symptoms as today's presentation:  findings:  There was a 3-4cm segment of the proximal to mid esophagus that was tortuous and slightly narrowed (lumen 76mm). There were multiple small AVMs at the site. The mucosa was otherwise normal, no suggestion of neoplasia. The previously well documented gastric submucosal lesion was again noted, appeared unchanged (over 10 year surveillance period). The examined duodenum was normal.  States has historically tried treatment for gerd which did not help. Tried forcing herself to gag/vomit by putting fingers down her throat, with some mucus production but she did note some bright red blood which was produced. This alarmed her so she returned today. Denies any further bleeding or blood to stool. She is on a blood thinner. Hx of allergies, anemia, arthritis, a fib, htn, heart murmur, pulmonary fibrosis    ROS per HPI.       Past Medical History:  Diagnosis Date  . Allergy   . Anemia   . Anemia   . Aortic insufficiency    a. Mod by echo 05/2015.  . Arthritis    "knees" (07/11/2015)  . Atrial fibrillation (HCC)    On Eliquis  . 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 Complication of anesthesia    "I had a hard time waking me up w/one of my shoulder surgeries"  . Coronary artery disease    a. Abnormal stress test -> LHc 06/2015 s/p DES to mCX and mLAD, residual D2 disease treated medially.  . Elevated blood pressure   . Erythema nodosum   . Heart murmur   . Insomnia   . lung ca dx'd 10/1999   chemo/xrt comp 05/29/2000  . Myalgia   . Nephrolithiasis 2015  . Obesity   . Pericardial effusion    a. 05/2015 Echo: EF 55-60%, no rwma, Gr 1 DD, no effusion - but an effusion was seen on CT which was felt to be increased in size compared to 12/2014 - pericardium also thickened.   . Pericarditis    a. Dx 05/2015.  [name] Pneumothorax, spontaneous, tension 01/20/2012   October, 2010  . Pulmonary fibrosis ([place]) 01/20/2012   Due to previous surgery and chest radiation for lung cancer  . Restrictive lung disease   . Type II diabetes mellitus [facility])     Patient Active Problem List   Diagnosis Date Noted  . Acute on chronic diastolic CHF (congestive heart failure) ([place]) 08/04/2017  . 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 Unstable angina ([place]) 04/12/2017  . Atrial fibrillation with RVR ([place])   . Hyperlipidemia 08/07/2015  . CAD S/P  percutaneous coronary angioplasty 07/25/2015  . Hypertension, essential   . Aortic insufficiency   . Stented coronary artery   . Abnormal nuclear stress test 07/11/2015  . Abnormal stress test 07/04/2015  . Pericarditis   . Controlled type 2 diabetes mellitus without complication, with long-term current use of insulin ([place])   . Pericardial effusion   . Chest pain 05/30/2015  . 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 Open angle with borderline findings and low glaucoma risk in both eyes 05/23/2015  . Restrictive lung disease 03/14/2015  .  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 Insomnia   . Obesity   . Anemia   . Kidney stones   . UNDIAGNOSED CARDIAC MURMURS 02/24/2009  . Lung cancer ([place]) 09/20/2008  . DYSPNEA 09/20/2008  . DIABETES MELLITUS, TYPE II 06/19/2007    Past Surgical History:  Procedure Laterality Date  . ABDOMINAL HYSTERECTOMY  2008  . APPENDECTOMY  1969?  [name] CARDIAC CATHETERIZATION N/A 07/11/2015   Procedure: Left Heart Cath and Coronary Angiography;  Surgeon: Jettie Booze, MD;  Location: [place] [facility];  Service: Cardiovascular;  Laterality: N/A;  . CARDIAC CATHETERIZATION  07/11/2015   Procedure: Coronary Stent Intervention;  Surgeon: Jettie Booze, MD;  Location: [place] [facility];  Service: Cardiovascular;;  . CARPAL TUNNEL RELEASE Bilateral 1998-2005?   right-left  . CARPAL TUNNEL RELEASE Left 2009?   "for the 2nd time"  . COLONOSCOPY    . CORONARY ANGIOPLASTY    . CYSTOSCOPY W/ STONE MANIPULATION  2013  . ESOPHAGOGASTRODUODENOSCOPY (EGD) WITH ESOPHAGEAL DILATION  2012  . FOOT SURGERY Bilateral 1980's   Callous removed   . INNER EAR SURGERY Right ~ 2009  . KNEE ARTHROPLASTY Right 1991  . KNEE ARTHROSCOPY  ~ 2000   LATERAL RELEASE  . LEFT HEART CATH AND CORONARY ANGIOGRAPHY N/A 12/18/2016   Procedure: Left Heart Cath and Coronary Angiography;  Surgeon: Burnell Blanks, MD;  Location: [facility];  Service: Cardiovascular;  Laterality: N/A;  . LUNG CANCER SURGERY Left 2001   "small cell"  . LUNG SURGERY Right 2007   "reinflated it"  . PULMONARY EMBOLISM SURGERY  2007   LUNG COLLAPSE  . RIGHT AND LEFT HEART CATH N/A 06/25/2017   Procedure: RIGHT AND LEFT HEART CATH;  Surgeon: Sherren Mocha, MD;  Location: [facility];  Service: Cardiovascular;  Laterality: N/A;  . SHOULDER ADHESION RELEASE Right ~ 2004  . SHOULDER ARTHROSCOPY W/ ROTATOR CUFF REPAIR Right ~ 2003    OB History   None      Home Medications    Prior to Admission medications     Medication Sig Start Date End Date Taking? Authorizing Provider  apixaban (ELIQUIS) 5 MG TABS tablet TAKE 1 TABLET BY MOUTH 2 (TWO) TIMES DAILY. 07/10/17   Dorothy Spark, MD  B-D UF III MINI PEN NEEDLES 31G X 5 MM MISC 2 (two) times daily. as directed 10/29/17   [provider]  Coenzyme Q10 50 MG CAPS Take 1 capsule (50 mg total) by mouth daily. 01/27/18   Dorothy Spark, MD  diltiazem (CARDIZEM CD) 240 MG 24 hr capsule Take 1 capsule (240 mg total) by mouth daily. 03/26/17   Dorothy Spark, MD  furosemide (LASIX) 40 MG tablet Take 1 tablet (40 mg total) by mouth 2 (two) times daily. 12/17/17 03/17/18  [name],  Jamse Belfast, MD  HUMALOG MIX 75/25 KWIKPEN (75-25) 100 UNIT/ML Kwikpen Inject into the skin 2 (two) times daily. Inject 50 units sq in Am and 30 units subque in PM 12/22/15   [provider]  KLOR-CON M20 20 MEQ tablet TAKE 1 TABLET BY MOUTH EVERY DAY 01/27/18   Dorothy Spark, MD  levothyroxine (SYNTHROID, LEVOTHROID) 50 MCG tablet Take 50 mcg by mouth daily. 05/14/17   [provider]  losartan (COZAAR) 25 MG tablet TAKE 1 TABLET (25 MG TOTAL) BY MOUTH DAILY. 10/03/17   Dorothy Spark, MD  meloxicam (MOBIC) 15 MG tablet Take 1 tablet by mouth daily with breakfast. 02/20/17   [provider]  metoprolol tartrate (LOPRESSOR) 25 MG tablet TAKE 1 TABLET (25 MG TOTAL) BY MOUTH 2 (TWO) TIMES DAILY. 08/27/17   Dorothy Spark, MD  nitroGLYCERIN (NITROSTAT) 0.4 MG SL tablet Place 1 tablet (0.4 mg total) under the tongue every 5 (five) minutes as needed for chest pain. 01/02/18 04/02/18  Lyda Jester M, PA-C  ONETOUCH VERIO test strip USE AS DIRECTED 3 TIMES A DAY 10/17/17   [provider]  pravastatin (PRAVACHOL) 20 MG tablet Take 1 tablet (20 mg total) by mouth every evening. 01/19/18 04/19/18  Dorothy Spark, MD  spironolactone (ALDACTONE) 25 MG tablet Take 1 tablet (25 mg total) by mouth daily. 12/17/17 03/17/18  Dorothy Spark, MD     Family History Family History  Problem Relation Age of Onset  . Heart disease Father   . Diabetes Father   . Pancreatic cancer Mother   . Diabetes Mother   . Hypertension Sister        x 3  . Diabetes Sister        x 4  . Diabetes Maternal Grandmother   . Diabetes Paternal Grandmother   . Diabetes Sister   . Diabetes Sister   . Diabetes Sister   . Kidney disease Sister   . Colon cancer Neg Hx   . Colon polyps Neg Hx   . Rectal cancer Neg Hx   . Stomach cancer Neg Hx   . Esophageal cancer Neg Hx   . Breast cancer Neg Hx     Social History Social History   Tobacco Use  . Smoking status: Former Smoker    Packs/day: 0.25    Years: 5.00    Pack years: 1.25    Types: Cigarettes    Last attempt to quit: 07/22/1999    Years since quitting: 18.5  . Smokeless tobacco: Never Used  Substance Use Topics  . Alcohol use: No    Alcohol/week: 0.0 oz  . Drug use: No     Allergies   Hydrocodone; Hydrocodone-acetaminophen; Morphine and related; Rosuvastatin; Lisinopril; and Nsaids   Review of Systems Review of Systems   Physical Exam Triage Vital Signs ED Triage Vitals [02/13/18 1151]  Enc Vitals Group     BP 126/72     Pulse Rate 67     Resp 18     Temp 98.3 F (36.8 C)     Temp src      SpO2 100 %     Weight      Height      Head Circumference      Peak Flow      Pain Score      Pain Loc      Pain Edu?      Excl. in [place]?    No data found.  Updated  Vital Signs BP 126/72   Pulse 67   Temp 98.3 F (36.8 C)   Resp 18   SpO2 100%   Visual Acuity Right Eye Distance:   Left Eye Distance:   Bilateral Distance:    Right Eye Near:   Left Eye Near:    Bilateral Near:     Physical Exam  Constitutional: She is oriented to person, place, and time. She appears well-developed and well-nourished. No distress.  HENT:  Head: Normocephalic and atraumatic.  Right Ear: Tympanic membrane, external ear and ear canal normal.  Left Ear: Tympanic membrane,  external ear and ear canal normal.  Nose: Nose normal.  Mouth/Throat: Uvula is midline, oropharynx is clear and moist and mucous membranes are normal. No tonsillar exudate.  Mild cervical lymphadenopathy  Eyes: Pupils are equal, round, and reactive to light. Conjunctivae and EOM are normal.  Cardiovascular: Normal rate and regular rhythm.  Murmur heard. Pulmonary/Chest: Effort normal and breath sounds normal.  Lymphadenopathy:    She has cervical adenopathy.  Neurological: She is alert and oriented to person, place, and time.  Skin: Skin is warm and dry.     UC Treatments / Results  Labs (all labs ordered are listed, but only abnormal results are displayed) Labs Reviewed - No data to display  EKG None Radiology No results found.  Procedures Procedures (including critical care time)  Medications Ordered in UC Medications - No data to display   Initial Impression / Assessment and Plan / UC Course  I have reviewed the triage vital signs and the nursing notes.  Pertinent labs & imaging results that were available during my care of the patient were reviewed by me and considered in my medical decision making (see chart for details).     Patient is swallowing and keeping down food and liquids. This appears to be an acute on chronic concern. Reassuring upper endoscopy in February of this year. Reassured patient that some blood after forcing vomiting while on blood thinner is not completely alarming at this time. Return precautions provided. Encouraged continue to follow with gi for continued evaluation of symptoms of dysphagia. Recommended may retry gerd treatment as this may potentially be beneficial. Patient verbalized understanding and agreeable to plan.    Final Clinical Impressions(s) / UC Diagnoses   Final diagnoses:  Dysphagia, unspecified type    ED Discharge Orders    None       Controlled Substance Prescriptions [place] Controlled Substance Registry consulted? Not  Applicable   Zigmund Gottron, NP 02/13/18 1233

## 2018-02-13 NOTE — Discharge Instructions (Signed)
Chew foods well prior to swallowing. May try daily omeprazole and see if this is helpful. Please continue to follow up with GI for continued evaluation of your symptoms. If develop blood in stool, persistent vomiting, shortness of breath, choking or otherwise worsening please return or go to Er.

## 2018-02-16 ENCOUNTER — Other Ambulatory Visit: Payer: Self-pay

## 2018-02-16 ENCOUNTER — Encounter (HOSPITAL_COMMUNITY): Payer: Self-pay

## 2018-02-16 ENCOUNTER — Emergency Department (HOSPITAL_COMMUNITY): Payer: Medicare Other

## 2018-02-16 ENCOUNTER — Emergency Department (HOSPITAL_COMMUNITY)
Admission: EM | Admit: 2018-02-16 | Discharge: 2018-02-17 | Disposition: A | Discharge status: Home / Self Care | Payer: Medicare Other | Attending: Emergency Medicine | Admitting: Emergency Medicine

## 2018-02-16 ENCOUNTER — Telehealth: Payer: Self-pay | Admitting: Gastroenterology

## 2018-02-16 ENCOUNTER — Other Ambulatory Visit: Payer: Self-pay | Admitting: Cardiology

## 2018-02-16 ENCOUNTER — Encounter: Payer: Self-pay | Admitting: Gastroenterology

## 2018-02-16 ENCOUNTER — Ambulatory Visit: Payer: Medicare Other | Admitting: Gastroenterology

## 2018-02-16 VITALS — BP 102/58 | HR 72 | Ht 67.5 in | Wt 256.1 lb

## 2018-02-16 DIAGNOSIS — Z955 Presence of coronary angioplasty implant and graft: Secondary | ICD-10-CM | POA: Insufficient documentation

## 2018-02-16 DIAGNOSIS — Z794 Long term (current) use of insulin: Secondary | ICD-10-CM | POA: Insufficient documentation

## 2018-02-16 DIAGNOSIS — Z87891 Personal history of nicotine dependence: Secondary | ICD-10-CM | POA: Diagnosis not present

## 2018-02-16 DIAGNOSIS — E119 Type 2 diabetes mellitus without complications: Secondary | ICD-10-CM | POA: Diagnosis not present

## 2018-02-16 DIAGNOSIS — R109 Unspecified abdominal pain: Secondary | ICD-10-CM

## 2018-02-16 DIAGNOSIS — R1310 Dysphagia, unspecified: Secondary | ICD-10-CM | POA: Diagnosis not present

## 2018-02-16 DIAGNOSIS — K5792 Diverticulitis of intestine, part unspecified, without perforation or abscess without bleeding: Secondary | ICD-10-CM | POA: Diagnosis not present

## 2018-02-16 DIAGNOSIS — I2510 Atherosclerotic heart disease of native coronary artery without angina pectoris: Secondary | ICD-10-CM | POA: Insufficient documentation

## 2018-02-16 DIAGNOSIS — I5033 Acute on chronic diastolic (congestive) heart failure: Secondary | ICD-10-CM | POA: Diagnosis not present

## 2018-02-16 DIAGNOSIS — Z7902 Long term (current) use of antithrombotics/antiplatelets: Secondary | ICD-10-CM | POA: Insufficient documentation

## 2018-02-16 DIAGNOSIS — Z7901 Long term (current) use of anticoagulants: Secondary | ICD-10-CM | POA: Insufficient documentation

## 2018-02-16 DIAGNOSIS — R1032 Left lower quadrant pain: Secondary | ICD-10-CM | POA: Diagnosis present

## 2018-02-16 DIAGNOSIS — Z85118 Personal history of other malignant neoplasm of bronchus and lung: Secondary | ICD-10-CM | POA: Diagnosis not present

## 2018-02-16 DIAGNOSIS — Z96651 Presence of right artificial knee joint: Secondary | ICD-10-CM | POA: Diagnosis not present

## 2018-02-16 DIAGNOSIS — Z79899 Other long term (current) drug therapy: Secondary | ICD-10-CM | POA: Insufficient documentation

## 2018-02-16 LAB — URINALYSIS, ROUTINE W REFLEX MICROSCOPIC
Bilirubin Urine: NEGATIVE
Glucose, UA: NEGATIVE mg/dL
Hgb urine dipstick: NEGATIVE
Ketones, ur: NEGATIVE mg/dL
Nitrite: NEGATIVE
Protein, ur: NEGATIVE mg/dL
Specific Gravity, Urine: 1.018 (ref 1.005–1.030)
pH: 5 (ref 5.0–8.0)

## 2018-02-16 LAB — I-STAT BETA HCG BLOOD, ED (MC, WL, AP ONLY): I-stat hCG, quantitative: <5 m[IU]/mL (ref <5)

## 2018-02-16 MED ORDER — ONDANSETRON HCL 4 MG/2ML IJ SOLN
4.0000 mg | Freq: Once | INTRAMUSCULAR | Status: AC
  Administered 2018-02-16: 4 mg via INTRAVENOUS
  Filled 2018-02-16: qty 2

## 2018-02-16 MED ORDER — SODIUM CHLORIDE 0.9 % IV BOLUS
500.0000 mL | Freq: Once | INTRAVENOUS | Status: AC
  Administered 2018-02-17: 500 mL via INTRAVENOUS

## 2018-02-16 MED ORDER — SODIUM CHLORIDE 0.9 % IV SOLN
1.0000 g | Freq: Once | INTRAVENOUS | Status: AC
  Administered 2018-02-16: 1 g via INTRAVENOUS
  Filled 2018-02-16: qty 10

## 2018-02-16 MED ORDER — FENTANYL CITRATE (PF) 100 MCG/2ML IJ SOLN
50.0000 ug | Freq: Once | INTRAMUSCULAR | Status: AC
  Administered 2018-02-16: 50 ug via INTRAVENOUS
  Filled 2018-02-16: qty 2

## 2018-02-16 NOTE — ED Triage Notes (Signed)
Patient c/o left flank pain since Sunday AM. Patient reports a history of kidney stones. Patient states after urinating she saw pink of the tissue paper.

## 2018-02-16 NOTE — Patient Instructions (Addendum)
You will be set up for an upper endoscopy for dilation (balloon) off eliquis for 1 day (dysphagia) We will communicate with your cardiologist about the safety of that recommendation.  Normal BMI (Body Mass Index- based on height and weight) is between 19 and 25. Your BMI today is Body mass index is 39.52 kg/m. [name] Please consider follow up  regarding your BMI with your Primary Care Provider.

## 2018-02-16 NOTE — ED Notes (Signed)
Bed: WLPT2 Expected date:  Expected time:  Means of arrival:  Comments: 

## 2018-02-16 NOTE — Telephone Encounter (Signed)
[facility] Pre-operative Risk Assessment     Request for surgical clearance:     Endoscopy Procedure  What type of surgery is being performed?     EGD  When is this surgery scheduled? 02/24/18  What type of clearance is required ?   Pharmacy  Are there any medications that need to be held prior to surgery and how long? Eliquis  Practice name and name of physician performing surgery?      [facility] Dr Ardis Hughs  What is your office phone and fax number?      Phone- [phone]  [phone]  Anesthesia type (None, local, MAC, general) ?       MAC

## 2018-02-16 NOTE — Telephone Encounter (Signed)
Routing to pharmacy.  Lori C. Gerhardt, RN, ANP-C [facility] [address] [place], [place]  [address] [phone]  

## 2018-02-16 NOTE — Progress Notes (Signed)
Review of gastrointestinal problems:  1. Routine risk for colon cancer;Feb 2009 colonoscopy with dr. Lajoyce Corners, no polyps. Next colonoscopy should be 11/2017; 09/2011 repeat colonoscopy Dr. Ardis Hughs for change in bowels (looser) found L sided diverticulosis, mucosa normal (no polyps or cancer), random biospies no microscopic colitis. 2. submucosal gastric lesion, first noted early 2009. Endoscopic ultrasound procedures have been performed, none with firm tissue diagnosis, next Examination at 3 year interval given stability, small size of lesion. Repeat endoscopic ultrasound 2010, discrete submucosal lesion, 1.8 cm. Repeat evaluation was set for 3 year interval. EGD 07/2012 Dr. Ardis Hughs showed nodule has not changed in size, recommended recall again at 3 years. EGD 12/2015 Dr. Ardis Hughs no change in size; 1.8cm "the mass in proximal stomach has not changed in 8 years of surveillance." 3. intermittent dysphagia since radiation, chemotherapy for lung cancer.Barium esophagram 2009 showed decreased peristalsis but no discrete strictures.Dr. Beryle Beams, Dr. Arlyce Dice, originally diagnosed in 2001.  EGD 11/2017:  3-4cm slightly narrowed segment in proximal to mid esophagus with AVMs associated.     HPI: This is a very pleasant 62 year old woman whom I last saw about 2 months ago.   Chief complaint is persistent intermittent dysphasia, also a single episode of minor hematemesis  Since she had radiation for her lung cancer she has been bothered by intermittent episodes of dysphasia, right sided throat discomfort when swallowing, intermittent hoarseness.  I did an EGD for her about 2 months ago and saw a slightly narrowed proximal esophagus that was also tortuous.  There is some AVMs at the location.  I suspect that this was radiation related.  I was not sure that dilation would really help this however.  She has since had a single episode of hematemesis and continued dysphasia symptoms.  ROS: complete GI ROS as described  in HPI, all other review negative.  Constitutional:  No unintentional weight loss   Past Medical History:  Diagnosis Date  . Allergy   . Anemia   . Anemia   . Aortic insufficiency    a. Mod by echo 05/2015.  . Arthritis    "knees" (07/11/2015)  . Atrial fibrillation (HCC)    On Eliquis  . 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 Complication of anesthesia    "I had a hard time waking me up w/one of my shoulder surgeries"  . Coronary artery disease    a. Abnormal stress test -> LHc 06/2015 s/p DES to mCX and mLAD, residual D2 disease treated medially.  . Elevated blood pressure   . Erythema nodosum   . Heart murmur   . Insomnia   . lung ca dx'd 10/1999   chemo/xrt comp 05/29/2000  . Myalgia   . Nephrolithiasis 2015  . Obesity   . Pericardial effusion    a. 05/2015 Echo: EF 55-60%, no rwma, Gr 1 DD, no effusion - but an effusion was seen on CT which was felt to be increased in size compared to 12/2014 - pericardium also thickened.   . Pericarditis    a. Dx 05/2015.  [name] Pneumothorax, spontaneous, tension 01/20/2012   October, 2010  . Pulmonary fibrosis ([place]) 01/20/2012   Due to previous surgery and chest radiation for lung cancer  . Restrictive lung disease   . Type II diabetes mellitus ([place])     Past Surgical History:  Procedure Laterality Date  . ABDOMINAL HYSTERECTOMY  2008  .  APPENDECTOMY  1969?  [name] CARDIAC CATHETERIZATION N/A 07/11/2015   Procedure: Left Heart Cath and Coronary Angiography;  Surgeon: Jettie Booze, MD;  Location: [facility];  Service: Cardiovascular;  Laterality: N/A;  . CARDIAC CATHETERIZATION  07/11/2015   Procedure: Coronary Stent Intervention;  Surgeon: Jettie Booze, MD;  Location: [facility];  Service: Cardiovascular;;  . CARPAL TUNNEL RELEASE Bilateral 1998-2005?   right-left  . CARPAL TUNNEL RELEASE Left 2009?    "for the 2nd time"  . COLONOSCOPY    . CORONARY ANGIOPLASTY    . CYSTOSCOPY W/ STONE MANIPULATION  2013  . ESOPHAGOGASTRODUODENOSCOPY (EGD) WITH ESOPHAGEAL DILATION  2012  . FOOT SURGERY Bilateral 1980's   Callous removed   . INNER EAR SURGERY Right ~ 2009  . KNEE ARTHROPLASTY Right 1991  . KNEE ARTHROSCOPY  ~ 2000   LATERAL RELEASE  . LEFT HEART CATH AND CORONARY ANGIOGRAPHY N/A 12/18/2016   Procedure: Left Heart Cath and Coronary Angiography;  Surgeon: Burnell Blanks, MD;  Location: [facility];  Service: Cardiovascular;  Laterality: N/A;  . LUNG CANCER SURGERY Left 2001   "small cell"  . LUNG SURGERY Right 2007   "reinflated it"  . PULMONARY EMBOLISM SURGERY  2007   LUNG COLLAPSE  . RIGHT AND LEFT HEART CATH N/A 06/25/2017   Procedure: RIGHT AND LEFT HEART CATH;  Surgeon: Sherren Mocha, MD;  Location: [facility];  Service: Cardiovascular;  Laterality: N/A;  . SHOULDER ADHESION RELEASE Right ~ 2004  . SHOULDER ARTHROSCOPY W/ ROTATOR CUFF REPAIR Right ~ 2003    Current Outpatient Medications  Medication Sig Dispense Refill  . apixaban (ELIQUIS) 5 MG TABS tablet TAKE 1 TABLET BY MOUTH 2 (TWO) TIMES DAILY. 180 tablet 1  . B-D UF III MINI PEN NEEDLES 31G X 5 MM MISC 2 (two) times daily. as directed  5  . Coenzyme Q10 50 MG CAPS Take 1 capsule (50 mg total) by mouth daily. 30 capsule 3  . diltiazem (CARDIZEM CD) 240 MG 24 hr capsule Take 1 capsule (240 mg total) by mouth daily. 90 capsule 3  . furosemide (LASIX) 40 MG tablet Take 1 tablet (40 mg total) by mouth 2 (two) times daily. 180 tablet 0  . HUMALOG MIX 75/25 KWIKPEN (75-25) 100 UNIT/ML Kwikpen Inject into the skin 2 (two) times daily. Inject 50 units sq in Am and 30 units subque in PM  6  . KLOR-CON M20 20 MEQ tablet TAKE 1 TABLET BY MOUTH EVERY DAY 90 tablet 1  . levothyroxine (SYNTHROID, LEVOTHROID) 50 MCG tablet Take 50 mcg by mouth daily.  6  . losartan (COZAAR) 25 MG tablet TAKE 1 TABLET (25 MG  TOTAL) BY MOUTH DAILY. 90 tablet 3  . meloxicam (MOBIC) 15 MG tablet Take 1 tablet by mouth daily with breakfast.  1  . metoprolol tartrate (LOPRESSOR) 25 MG tablet TAKE 1 TABLET (25 MG TOTAL) BY MOUTH 2 (TWO) TIMES DAILY. 180 tablet 3  . ONETOUCH VERIO test strip USE AS DIRECTED 3 TIMES A DAY  4  . pravastatin (PRAVACHOL) 20 MG tablet Take 1 tablet (20 mg total) by mouth every evening. 30 tablet 0  . spironolactone (ALDACTONE) 25 MG tablet Take 1 tablet (25 mg total) by mouth daily. 90 tablet 1  . nitroGLYCERIN (NITROSTAT) 0.4 MG SL tablet Place 1 tablet (0.4 mg total) under the tongue every 5 (five) minutes as needed for chest pain. (Patient not taking: Reported on 02/16/2018) 25  tablet 3   Current Facility-Administered Medications  Medication Dose Route Frequency Provider Last Rate Last Dose  . 0.9 %  sodium chloride infusion  500 mL Intravenous Once Milus Banister, MD        Allergies as of 02/16/2018 - Review Complete 02/16/2018  Allergen Reaction Noted  . Hydrocodone Hives and Itching   . Hydrocodone-acetaminophen Hives 07/23/2017  . Morphine and related Other (See Comments) 11/09/2011  . Rosuvastatin Other (See Comments) 01/19/2018  . Lisinopril Other (See Comments) and Cough 07/25/2015  . Nsaids Other (See Comments) 11/09/2011    Family History  Problem Relation Age of Onset  . Heart disease Father   . Diabetes Father   . Pancreatic cancer Mother   . Diabetes Mother   . Hypertension Sister        x 3  . Diabetes Sister        x 4  . Diabetes Maternal Grandmother   . Diabetes Paternal Grandmother   . Diabetes Sister   . Diabetes Sister   . Diabetes Sister   . Kidney disease Sister   . Colon cancer Neg Hx   . Colon polyps Neg Hx   . Rectal cancer Neg Hx   . Stomach cancer Neg Hx   . Esophageal cancer Neg Hx   . Breast cancer Neg Hx     Social History   Socioeconomic History  . Marital status: Single    Spouse name: Not on file  . Number of children: 0  .  Years of education: Not on file  . Highest education level: Not on file  Occupational History  . Occupation: RETIRED  Social Needs  . Financial resource strain: Not on file  . Food insecurity:    Worry: Not on file    Inability: Not on file  . Transportation needs:    Medical: Not on file    Non-medical: Not on file  Tobacco Use  . Smoking status: Former Smoker    Packs/day: 0.25    Years: 5.00    Pack years: 1.25    Types: Cigarettes    Last attempt to quit: 07/22/1999    Years since quitting: 18.5  . Smokeless tobacco: Never Used  Substance and Sexual Activity  . Alcohol use: No    Alcohol/week: 0.0 oz  . Drug use: No  . Sexual activity: Not Currently    Birth control/protection: Other-see comments    Comment: hysterectomy  Lifestyle  . Physical activity:    Days per week: Not on file    Minutes per session: Not on file  . Stress: Not on file  Relationships  . Social connections:    Talks on phone: Not on file    Gets together: Not on file    Attends religious service: Not on file    Active member of club or organization: Not on file    Attends meetings of clubs or organizations: Not on file    Relationship status: Not on file  . Intimate partner violence:    Fear of current or ex partner: Not on file    Emotionally abused: Not on file    Physically abused: Not on file    Forced sexual activity: Not on file  Other Topics Concern  . Not on file  Social History Narrative  . Not on file     Physical Exam: BP (!) 102/58 (BP Location: Left Arm, Patient Position: Sitting, Cuff Size: Large)   Pulse 72   Ht  5' 7.5" (1.715 m)   Wt 256 lb 2 oz (116.2 kg)   BMI 39.52 kg/m  Constitutional: generally well-appearing Psychiatric: alert and oriented x3 Abdomen: soft, nontender, nondistended, no obvious ascites, no peritoneal signs, normal bowel sounds No peripheral edema noted in lower extremities  Assessment and plan: 62 y.o. female with proximal esophageal  narrowing, tortuosity, dysphasia  I am not sure if dilation will help this somewhat tortuous area in her proximal esophagus that is also slightly narrowed with AVMs at the site.  I think this is probably radiation related from radiation 15 to 20 years ago for lung cancer.  She continues to be bothered by symptoms and so it is certainly worth a try, balloon dilation after Eliquis hold for 1 day.  We will arrange for that to be done at her soonest convenience and will communicate with cardiology about the safety of those recommendations.  Please see the "Patient Instructions" section for addition details about the plan.  [name] Loffler, MD [facility] 02/16/2018, 2:20 PM

## 2018-02-16 NOTE — ED Notes (Signed)
Bed: WA32 Expected date:  Expected time:  Means of arrival:  Comments: 

## 2018-02-16 NOTE — Telephone Encounter (Signed)
Patient with diagnosis of atrial fibrillation on Eliquis for anticoagulation.    Procedure: endoscopic procedure (colonoscopy) Date of procedure: 02/24/18  CHADS2-VASc score of  5 (CHF, HTN, , DM2, , CAD, , female)  CrCl  96.7 Platelet count 289  Per office protocol, patient can hold Eliquis for 1 day prior to procedure.

## 2018-02-16 NOTE — Telephone Encounter (Signed)
Routing to Dr. Edison Nasuti.  Burtis Junes, RN, [place] [address] [place], [place]  [address] [phone]

## 2018-02-17 LAB — CBC WITH DIFFERENTIAL/PLATELET
Basophils Absolute: 0 10*3/uL (ref 0.0–0.1)
Basophils Relative: 0 %
Eosinophils Absolute: 0.5 10*3/uL (ref 0.0–0.7)
Eosinophils Relative: 4 %
HCT: 34.7 % — ABNORMAL LOW (ref 36.0–46.0)
Hemoglobin: 11.2 g/dL — ABNORMAL LOW (ref 12.0–15.0)
Lymphocytes Relative: 19 %
Lymphs Abs: 2.1 10*3/uL (ref 0.7–4.0)
MCH: 30.8 pg (ref 26.0–34.0)
MCHC: 32.3 g/dL (ref 30.0–36.0)
MCV: 95.3 fL (ref 78.0–100.0)
Monocytes Absolute: 0.9 10*3/uL (ref 0.1–1.0)
Monocytes Relative: 9 %
Neutro Abs: 7.5 10*3/uL (ref 1.7–7.7)
Neutrophils Relative %: 68 %
Platelets: 342 10*3/uL (ref 150–400)
RBC: 3.64 MIL/uL — ABNORMAL LOW (ref 3.87–5.11)
RDW: 12.4 % (ref 11.5–15.5)
WBC: 11 10*3/uL — ABNORMAL HIGH (ref 4.0–10.5)

## 2018-02-17 LAB — COMPREHENSIVE METABOLIC PANEL
ALT: 15 U/L (ref 14–54)
AST: 21 U/L (ref 15–41)
Albumin: 3.5 g/dL (ref 3.5–5.0)
Alkaline Phosphatase: 103 U/L (ref 38–126)
Anion gap: 9 (ref 5–15)
BUN: 21 mg/dL — ABNORMAL HIGH (ref 6–20)
CO2: 27 mmol/L (ref 22–32)
Calcium: 9.4 mg/dL (ref 8.9–10.3)
Chloride: 106 mmol/L (ref 101–111)
Creatinine, Ser: 1.05 mg/dL — ABNORMAL HIGH (ref 0.44–1.00)
GFR calc Af Amer: >60 mL/min (ref >60)
GFR calc non Af Amer: 57 mL/min — ABNORMAL LOW (ref >60)
Glucose, Bld: 151 mg/dL — ABNORMAL HIGH (ref 65–99)
Potassium: 3.7 mmol/L (ref 3.5–5.1)
Sodium: 142 mmol/L (ref 135–145)
Total Bilirubin: 0.7 mg/dL (ref 0.3–1.2)
Total Protein: 8.1 g/dL (ref 6.5–8.1)

## 2018-02-17 LAB — LIPASE, BLOOD: Lipase: 28 U/L (ref 11–51)

## 2018-02-17 MED ORDER — LIDOCAINE 5 % EX PTCH
1.0000 | MEDICATED_PATCH | CUTANEOUS | Status: DC
  Administered 2018-02-17: 1 via TRANSDERMAL
  Filled 2018-02-17: qty 1

## 2018-02-17 MED ORDER — OXYCODONE-ACETAMINOPHEN 5-325 MG PO TABS: 1.0000 | ORAL_TABLET | ORAL | 0 refills | Status: DC | PRN

## 2018-02-17 MED ORDER — CIPROFLOXACIN HCL 500 MG PO TABS: 500.0000 mg | ORAL_TABLET | Freq: Two times a day (BID) | ORAL | 0 refills | Status: DC

## 2018-02-17 MED ORDER — FENTANYL CITRATE (PF) 100 MCG/2ML IJ SOLN
50.0000 ug | Freq: Once | INTRAMUSCULAR | Status: AC
  Administered 2018-02-17: 50 ug via INTRAVENOUS
  Filled 2018-02-17: qty 2

## 2018-02-17 MED ORDER — OXYCODONE-ACETAMINOPHEN 5-325 MG PO TABS
1.0000 | ORAL_TABLET | Freq: Once | ORAL | Status: AC
  Administered 2018-02-17: 1 via ORAL
  Filled 2018-02-17: qty 1

## 2018-02-17 MED ORDER — HYDROMORPHONE HCL 1 MG/ML IJ SOLN
1.0000 mg | Freq: Once | INTRAMUSCULAR | Status: AC
  Administered 2018-02-17: 1 mg via INTRAVENOUS
  Filled 2018-02-17: qty 1

## 2018-02-17 MED ORDER — ONDANSETRON 4 MG PO TBDP: 4.0000 mg | ORAL_TABLET | Freq: Three times a day (TID) | ORAL | 0 refills | Status: DC | PRN

## 2018-02-17 MED ORDER — METRONIDAZOLE 500 MG PO TABS: 500.0000 mg | ORAL_TABLET | Freq: Three times a day (TID) | ORAL | 0 refills | Status: DC

## 2018-02-17 NOTE — ED Notes (Signed)
Patient states 10/10 left flank pain-patient falling asleep while this Probation officer talking with patient-Mother at bedside-administered Fentanyl as ordered. Patient is comfortable in bed sleeping.

## 2018-02-17 NOTE — Discharge Instructions (Signed)
Your CAT scan did show signs of diverticulitis.  He also have what appears to be a urinary tract infection.  Please take antibiotics as prescribed.  Have also given you short course of pain medication.  This medication will make you drowsy so do not drive with it.  I would also recommend taking NSAIDs such as Aleve.  Have given you short course of nausea medication.  Follow-up with the primary care doctor this week and return the ED with any worsening symptoms.  If symptoms do not improve make she follow-up with a GI doctor.

## 2018-02-17 NOTE — ED Provider Notes (Addendum)
[facility] DEPT Provider Note   CSN: [id number] Arrival date & time: 02/16/18  1505     History   Chief Complaint Chief Complaint  Patient presents with  . Flank Pain    HPI [name] is a 62 y.o. female.  HPI 62 year old AA female with a past medical history significant for atrial fibrillation currently on Eliquis, CAD, lung cancer, diabetes that presents to the emergency department today presents to the emergency department today for evaluation of left flank pain and left lower quadrant abdominal pain.  Patient states that it started approximately 2 days ago.  Reports history of kidney stones and thought this was a similar.  She reports seeing some pink after urinating today.  Patient denies any gross hematuria.  Denies any dysuria, urgency or frequency.  Patient reports normal bowel movements.  Denies any blood in her stool.  She states the pain is sharp in nature.  Palpation and movement makes the pain worse.  Nothing makes the pain better.  She has not taken anything for the pain prior to arrival.  Patient denies any associated fevers or chills.  States that she did see her GI doctor today for intermittent dysphasia and single episode of minor hematemesis.  Patient is followed by GI.  Pt denies any fever, chill, ha, vision changes, lightheadedness, dizziness, congestion, neck pain, cp, sob, cough, n/v/d, urinary symptoms, change in bowel habits, melena, hematochezia, lower extremity paresthesias.   Past Medical History:  Diagnosis Date  . Allergy   . Anemia   . Anemia   . Aortic insufficiency    a. Mod by echo 05/2015.  . Arthritis    "knees" (07/11/2015)  . Atrial fibrillation (HCC)    On Eliquis  . 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 Complication of anesthesia    "I had a hard time waking me up w/one of my shoulder  surgeries"  . Coronary artery disease    a. Abnormal stress test -> LHc 06/2015 s/p DES to mCX and mLAD, residual D2 disease treated medially.  . Elevated blood pressure   . Erythema nodosum   . Heart murmur   . Insomnia   . lung ca dx'd 10/1999   chemo/xrt comp 05/29/2000  . Myalgia   . Nephrolithiasis 2015  . Obesity   . Pericardial effusion    a. 05/2015 Echo: EF 55-60%, no rwma, Gr 1 DD, no effusion - but an effusion was seen on CT which was felt to be increased in size compared to 12/2014 - pericardium also thickened.   . Pericarditis    a. Dx 05/2015.  [name] Pneumothorax, spontaneous, tension 01/20/2012   October, 2010  . Pulmonary fibrosis ([place]) 01/20/2012   Due to previous surgery and chest radiation for lung cancer  . Restrictive lung disease   . Type II diabetes mellitus [facility])     Patient Active Problem List   Diagnosis Date Noted  . Acute on chronic diastolic CHF (congestive heart failure) ([place]) 08/04/2017  . 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 Unstable angina ([employer]) 04/12/2017  . Atrial fibrillation with RVR ([place])   . Hyperlipidemia 08/07/2015  . CAD S/P percutaneous coronary angioplasty 07/25/2015  . Hypertension, essential   . Aortic insufficiency   . Stented coronary artery   . Abnormal nuclear stress test 07/11/2015  . Abnormal stress test 07/04/2015  .  Pericarditis   . Controlled type 2 diabetes mellitus without complication, with long-term current use of insulin ([place])   . Pericardial effusion   . Chest pain 05/30/2015  . 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 Open angle with borderline findings and low glaucoma risk in both eyes 05/23/2015  . Restrictive lung disease 03/14/2015  . 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 Insomnia   . Obesity   . Anemia   . Kidney stones   . UNDIAGNOSED CARDIAC MURMURS  02/24/2009  . Lung cancer ([place]) 09/20/2008  . DYSPNEA 09/20/2008  . DIABETES MELLITUS, TYPE II 06/19/2007    Past Surgical History:  Procedure Laterality Date  . ABDOMINAL HYSTERECTOMY  2008  . APPENDECTOMY  1969?  [name] CARDIAC CATHETERIZATION N/A 07/11/2015   Procedure: Left Heart Cath and Coronary Angiography;  Surgeon: Jettie Booze, MD;  Location: [facility];  Service: Cardiovascular;  Laterality: N/A;  . CARDIAC CATHETERIZATION  07/11/2015   Procedure: Coronary Stent Intervention;  Surgeon: Jettie Booze, MD;  Location: [facility];  Service: Cardiovascular;;  . CARPAL TUNNEL RELEASE Bilateral 1998-2005?   right-left  . CARPAL TUNNEL RELEASE Left 2009?   "for the 2nd time"  . COLONOSCOPY    . CORONARY ANGIOPLASTY    . CYSTOSCOPY W/ STONE MANIPULATION  2013  . ESOPHAGOGASTRODUODENOSCOPY (EGD) WITH ESOPHAGEAL DILATION  2012  . FOOT SURGERY Bilateral 1980's   Callous removed   . INNER EAR SURGERY Right ~ 2009  . KNEE ARTHROPLASTY Right 1991  . KNEE ARTHROSCOPY  ~ 2000   LATERAL RELEASE  . LEFT HEART CATH AND CORONARY ANGIOGRAPHY N/A 12/18/2016   Procedure: Left Heart Cath and Coronary Angiography;  Surgeon: Burnell Blanks, MD;  Location: [facility];  Service: Cardiovascular;  Laterality: N/A;  . LUNG CANCER SURGERY Left 2001   "small cell"  . LUNG SURGERY Right 2007   "reinflated it"  . PULMONARY EMBOLISM SURGERY  2007   LUNG COLLAPSE  . RIGHT AND LEFT HEART CATH N/A 06/25/2017   Procedure: RIGHT AND LEFT HEART CATH;  Surgeon: Sherren Mocha, MD;  Location: [facility];  Service: Cardiovascular;  Laterality: N/A;  . SHOULDER ADHESION RELEASE Right ~ 2004  . SHOULDER ARTHROSCOPY W/ ROTATOR CUFF REPAIR Right ~ 2003     OB History   None      Home Medications    Prior to Admission medications   Medication Sig Start Date End Date Taking? Authorizing Provider  apixaban (ELIQUIS) 5 MG TABS tablet TAKE 1 TABLET BY MOUTH 2 (TWO)  TIMES DAILY. 07/10/17  Yes Dorothy Spark, MD  Coenzyme Q10 50 MG CAPS Take 1 capsule (50 mg total) by mouth daily. 01/27/18  Yes Dorothy Spark, MD  diltiazem (CARDIZEM CD) 240 MG 24 hr capsule Take 1 capsule (240 mg total) by mouth daily. 03/26/17  Yes Dorothy Spark, MD  furosemide (LASIX) 40 MG tablet Take 1 tablet (40 mg total) by mouth 2 (two) times daily. 12/17/17 03/17/18 Yes Dorothy Spark, MD  HUMALOG MIX 75/25 KWIKPEN (75-25) 100 UNIT/ML Kwikpen Inject into the skin 2 (two) times daily. Inject 50 units sq in Am and 30 units subque in PM 12/22/15  Yes [provider]  KLOR-CON M20 20 MEQ tablet TAKE 1 TABLET BY MOUTH EVERY DAY 01/27/18  Yes Dorothy Spark, MD  levothyroxine (SYNTHROID, LEVOTHROID) 50 MCG tablet Take 50 mcg by mouth daily. 05/14/17  Yes [provider]  losartan (COZAAR) 25 MG tablet TAKE 1 TABLET (25 MG TOTAL) BY MOUTH DAILY. 10/03/17  Yes Dorothy Spark, MD  meloxicam (MOBIC) 15 MG tablet Take 1 tablet by mouth daily with breakfast. 02/20/17  Yes [provider]  metoprolol tartrate (LOPRESSOR) 25 MG tablet TAKE 1 TABLET (25 MG TOTAL) BY MOUTH 2 (TWO) TIMES DAILY. 08/27/17  Yes Dorothy Spark, MD  pravastatin (PRAVACHOL) 20 MG tablet Take 1 tablet (20 mg total) by mouth every evening. 01/19/18 04/19/18 Yes Dorothy Spark, MD  spironolactone (ALDACTONE) 25 MG tablet Take 1 tablet (25 mg total) by mouth daily. 12/17/17 03/17/18 Yes Dorothy Spark, MD  B-D UF III MINI PEN NEEDLES 31G X 5 MM MISC 2 (two) times daily. as directed 10/29/17   [provider]  ciprofloxacin (CIPRO) 500 MG tablet Take 1 tablet (500 mg total) by mouth 2 (two) times daily. 02/17/18   Doristine Devoid, PA-C  metroNIDAZOLE (FLAGYL) 500 MG tablet Take 1 tablet (500 mg total) by mouth 3 (three) times daily. DO NOT CONSUME ALCOHOL WHILE TAKING THIS MEDICATION. 02/17/18   Doristine Devoid, PA-C  nitroGLYCERIN (NITROSTAT) 0.4 MG SL tablet Place 1 tablet  (0.4 mg total) under the tongue every 5 (five) minutes as needed for chest pain. 01/02/18 04/02/18  Lyda Jester M, PA-C  ondansetron (ZOFRAN ODT) 4 MG disintegrating tablet Take 1 tablet (4 mg total) by mouth every 8 (eight) hours as needed for nausea or vomiting. 02/17/18   Leaphart, Zack Seal, PA-C  ONETOUCH VERIO test strip USE AS DIRECTED 3 TIMES A DAY 10/17/17   [provider]  oxyCODONE-acetaminophen (PERCOCET/ROXICET) 5-325 MG tablet Take 1-2 tablets by mouth every 4 (four) hours as needed for severe pain. 02/17/18   Doristine Devoid, PA-C    Family History Family History  Problem Relation Age of Onset  . Heart disease Father   . Diabetes Father   . Pancreatic cancer Mother   . Diabetes Mother   . Hypertension Sister        x 3  . Diabetes Sister        x 4  . Diabetes Maternal Grandmother   . Diabetes Paternal Grandmother   . Diabetes Sister   . Diabetes Sister   . Diabetes Sister   . Kidney disease Sister   . Colon cancer Neg Hx   . Colon polyps Neg Hx   . Rectal cancer Neg Hx   . Stomach cancer Neg Hx   . Esophageal cancer Neg Hx   . Breast cancer Neg Hx     Social History Social History   Tobacco Use  . Smoking status: Former Smoker    Packs/day: 0.25    Years: 5.00    Pack years: 1.25    Types: Cigarettes    Last attempt to quit: 07/22/1999    Years since quitting: 18.5  . Smokeless tobacco: Never Used  Substance Use Topics  . Alcohol use: No    Alcohol/week: 0.0 oz  . Drug use: No     Allergies   Hydrocodone; Hydrocodone-acetaminophen; Morphine and related; Rosuvastatin; Lisinopril; and Nsaids   Review of Systems Review of Systems  All other systems reviewed and are negative.    Physical Exam Updated Vital Signs BP (!) 155/62   Pulse 82   Temp 99.3 F (37.4 C) (Oral)   Resp 18  Ht 5' 7.5" (1.715 m)   Wt 114.3 kg (252 lb)   SpO2 93%   BMI 38.89 kg/m   Physical Exam  Constitutional: She is oriented to person, place,  and time. She appears well-developed and well-nourished.  Non-toxic appearance. No distress.  HENT:  Head: Normocephalic and atraumatic.  Nose: Nose normal.  Mouth/Throat: Oropharynx is clear and moist.  Eyes: Pupils are equal, round, and reactive to light. Conjunctivae are normal. Right eye exhibits no discharge. Left eye exhibits no discharge.  Neck: Normal range of motion. Neck supple.  Cardiovascular: Normal rate, regular rhythm, normal heart sounds and intact distal pulses. Exam reveals no gallop and no friction rub.  No murmur heard. Pulmonary/Chest: Effort normal and breath sounds normal. No stridor. No respiratory distress. She has no wheezes. She has no rales. She exhibits no tenderness.  Abdominal: Soft. Bowel sounds are normal. There is tenderness in the left upper quadrant and left lower quadrant. There is CVA tenderness (left). There is no rigidity, no rebound, no guarding, no tenderness at McBurney's point and negative Murphy's sign.  Musculoskeletal: Normal range of motion. She exhibits no tenderness.       Back:  Lymphadenopathy:    She has no cervical adenopathy.  Neurological: She is alert and oriented to person, place, and time.  Skin: Skin is warm and dry. Capillary refill takes less than 2 seconds.  Psychiatric: Her behavior is normal. Judgment and thought content normal.  Nursing note and vitals reviewed.    ED Treatments / Results  Labs (all labs ordered are listed, but only abnormal results are displayed) Labs Reviewed  URINALYSIS, ROUTINE W REFLEX MICROSCOPIC - Abnormal; Notable for the following components:      Result Value   APPearance HAZY (*)    Leukocytes, UA MODERATE (*)    Bacteria, UA RARE (*)    All other components within normal limits  COMPREHENSIVE METABOLIC PANEL - Abnormal; Notable for the following components:   Glucose, Bld 151 (*)    BUN 21 (*)    Creatinine, Ser 1.05 (*)    GFR calc non Af Amer 57 (*)    All other components within  normal limits  CBC WITH DIFFERENTIAL/PLATELET - Abnormal; Notable for the following components:   WBC 11.0 (*)    RBC 3.64 (*)    Hemoglobin 11.2 (*)    HCT 34.7 (*)    All other components within normal limits  URINE CULTURE  LIPASE, BLOOD  I-STAT BETA HCG BLOOD, ED ([facility], [facility], [facility] ONLY)    EKG None  Radiology US Renal  Result Date: 02/16/2018 CLINICAL DATA:  Left-sided flank pain EXAM: RENAL / URINARY TRACT ULTRASOUND COMPLETE COMPARISON:  CT 09/22/2014 FINDINGS: Right Kidney: Length: 9.7 cm. Cortical echogenicity within normal limits. Small cyst in the midpole measuring 1.3 cm. No hydronephrosis. Left Kidney: Length: 10.4 cm. Echogenicity within normal limits. No mass or hydronephrosis visualized. Bladder: Appears normal for degree of bladder distention. IMPRESSION: 1. Negative for hydronephrosis. 2. Small cyst in the right kidney Electronically Signed   By: Donavan Foil M.D.   On: 02/16/2018 18:39   Ct Renal Stone Study  Result Date: 02/16/2018 CLINICAL DATA:  Left flank pain, prior hysterectomy and appendectomy EXAM: CT ABDOMEN AND PELVIS WITHOUT CONTRAST TECHNIQUE: Multidetector CT imaging of the abdomen and pelvis was performed following the standard protocol without IV contrast. COMPARISON:  09/22/2014 FINDINGS: Lower chest: Lung bases are clear. Hepatobiliary: Unenhanced liver is unremarkable. Layering gallstones versus gallbladder sludge (series 2/image  33), without associated inflammatory changes. No intrahepatic or extrahepatic ductal dilatation. Pancreas: Within normal limits. Spleen: Within normal limits. Adrenals/Urinary Tract: Adrenal glands are within normal limits. Kidneys are within normal limits. No renal, ureteral, or bladder calculi. No hydronephrosis. Bladder is within normal limits. Stomach/Bowel: Stomach is within normal limits. No evidence of bowel obstruction. Prior appendectomy. Mild sigmoid diverticulitis with pericolonic inflammatory changes along the proximal sigmoid  colon in the left lower abdomen (series 2/image 60). No drainable fluid collection/abscess. No free air to suggest macroscopic perforation. Vascular/Lymphatic: No evidence of abdominal aortic aneurysm. No suspicious abdominopelvic lymphadenopathy. Reproductive: Status post hysterectomy. No adnexal masses. Other: No abdominopelvic ascites. Musculoskeletal: Degenerative changes of the lower thoracic spine. IMPRESSION: Mild sigmoid diverticulitis. No drainable fluid collection/abscess.  No free air. Electronically Signed   By: Julian Hy M.D.   On: 02/16/2018 23:52    Procedures Procedures (including critical care time)  Medications Ordered in ED Medications  lidocaine (LIDODERM) 5 % 1 patch (1 patch Transdermal Patch Applied 02/17/18 0245)  fentaNYL (SUBLIMAZE) injection 50 mcg (has no administration in time range)  sodium chloride 0.9 % bolus 500 mL (0 mLs Intravenous Stopped 02/17/18 0236)  fentaNYL (SUBLIMAZE) injection 50 mcg (50 mcg Intravenous Given 02/16/18 2343)  ondansetron (ZOFRAN) injection 4 mg (4 mg Intravenous Given 02/16/18 2343)  cefTRIAXone (ROCEPHIN) 1 g in sodium chloride 0.9 % 100 mL IVPB (0 g Intravenous Stopped 02/17/18 0031)  HYDROmorphone (DILAUDID) injection 1 mg (1 mg Intravenous Given 02/17/18 0132)  oxyCODONE-acetaminophen (PERCOCET/ROXICET) 5-325 MG per tablet 1 tablet (1 tablet Oral Given 02/17/18 0246)     Initial Impression / Assessment and Plan / ED Course  I have reviewed the triage vital signs and the nursing notes.  Pertinent labs & imaging results that were available during my care of the patient were reviewed by me and considered in my medical decision making (see chart for details).     Patient presents to the ED with complaints of left flank pain and left lower quadrant abdominal pain.  Onset 2 days ago.  Patient denies any associated change in bowel habits, fevers or vomiting.  Reports some hematuria when she wiped today.  Is overall well-appearing  and nontoxic.  She does appear to be in some discomfort due to pain.  However her vital signs are reassuring.  She is afebrile.  No tachycardia noted.  Patient is without any hypotension.  On exam patient does have some left-sided CVA tenderness.  She also has some left lower quadrant abdominal pain.  Bowel sounds are present in all 4 quadrants.  No signs of peritonitis.  Lungs are clear to auscultation.  Heart regular rate and rhythm.  Neurovascular intact in all extremities.  Lab work shows a mild leukocytosis of 11,000.  Hemoglobin appears the patient's baseline.  Electro lites are reassuring.  Normal liver enzymes and kidney function.  Lipase is normal.  Patency test was negative.  Her UA showed moderate leukocytes and rare bacteria.  She was nitrite negative.  Renal ultrasound obtained showed no acute findings to suggest patient's left-sided pain.  CT was obtained to rule any ureteral stones.  CT shows mild diverticulitis but no signs of perinephric stranding or ureteral stones.  No complications.  Dose of Rocephin in the ED has a suspected pyelonephritis however given the findings of diverticulitis will start patient on Cipro and Flagyl.  This will cover any urinary tract infection.  She has been given several rounds of pain medication in the ED.  Every  time I go to reassess patient she seems to be sleeping however when I open the door she will awaken and starts groaning in pain.  Patient is not tachycardic.  She seems to be resting comfortably on the bed when I enter the room.  I do not feel the patient needs admission for pain control.  We will send her home with pain medicine.  Patient has no intractable vomiting.  She is able to tolerate p.o. fluids.  Patient does not meet any Sirs or sepsis criteria.  She remains hemodynamically stable and appropriate for discharge at this time.  Pt is hemodynamically stable, in NAD, & able to ambulate in the ED. Evaluation does not show pathology that  would require ongoing emergent intervention or inpatient treatment. I explained the diagnosis to the patient. Pain has been managed & has no complaints prior to dc. Pt is comfortable with above plan and is stable for discharge at this time. All questions were answered prior to disposition. Strict return precautions for f/u to the ED were discussed. Encouraged follow up with PCP.  Discussed return precautions with patient.  Pt dicussed with my attending who is agreeable with the above plan.    Informed that over 400 by nursing staff that patient was falling asleep and was descending.  Had been given the fentanyl.  Patient was watched for approximately 45 minutes in the ED.  Able to maintain saturations.  She is remained awake and alert.  No decreased respirations.  She appears comfortable and eating ice in the room.  She reports pain when she moves.  This seems more musculoskeletal in nature however given the findings will consist continue with antibiotics and pain medication.  Final Clinical Impressions(s) / ED Diagnoses   Final diagnoses:  Left flank pain  Diverticulitis    ED Discharge Orders        Ordered    metroNIDAZOLE (FLAGYL) 500 MG tablet  3 times daily     02/17/18 0319    ciprofloxacin (CIPRO) 500 MG tablet  2 times daily     02/17/18 0319    oxyCODONE-acetaminophen (PERCOCET/ROXICET) 5-325 MG tablet  Every 4 hours PRN     02/17/18 0319    ondansetron (ZOFRAN ODT) 4 MG disintegrating tablet  Every 8 hours PRN     02/17/18 0320       Doristine Devoid, PA-C 02/17/18 0331    Doristine Devoid, PA-C 02/17/18 0435    Merryl Hacker, MD 02/17/18 [phone]

## 2018-02-17 NOTE — Telephone Encounter (Signed)
Spoke to patient, informed her that she has clearance from from her medical doctor to hold Eliquis for 1 day prior to her procedure. Patient voiced understanding.

## 2018-02-17 NOTE — ED Notes (Signed)
EDPA at bedside re-evaluating patient

## 2018-02-19 LAB — URINE CULTURE: Culture: >=100000 — AB

## 2018-02-20 ENCOUNTER — Telehealth: Payer: Self-pay | Admitting: *Deleted

## 2018-02-20 NOTE — Telephone Encounter (Signed)
Post ED Visit - Positive Culture Follow-up  Culture report reviewed by antimicrobial stewardship pharmacist:  []  Elenor Quinones, Pharm.D. []  Heide Guile, Pharm.D., BCPS AQ-ID []  Parks Neptune, Pharm.D., BCPS []  Alycia Rossetti, Pharm.D., BCPS []  [place], Pharm.D., BCPS, AAHIVP []  Legrand Como, Pharm.D., BCPS, AAHIVP []  Salome Arnt, PharmD, BCPS [x]  Wynell Balloon, PharmD []  Vincenza Hews, PharmD, BCPS  Positive urine culture Treated with Ciprofloxacin HCL, organism sensitive to the same and no further patient follow-up is required at this time.  Harlon Flor Talley 02/20/2018, 12:00 PM

## 2018-02-24 ENCOUNTER — Encounter: Payer: Self-pay | Admitting: Gastroenterology

## 2018-02-24 ENCOUNTER — Other Ambulatory Visit: Payer: Self-pay

## 2018-02-24 ENCOUNTER — Ambulatory Visit (AMBULATORY_SURGERY_CENTER): Payer: Medicare Other | Admitting: Gastroenterology

## 2018-02-24 VITALS — BP 114/52 | HR 54 | Temp 98.4°F | Resp 22 | Ht 67.0 in | Wt 257.0 lb

## 2018-02-24 DIAGNOSIS — K222 Esophageal obstruction: Secondary | ICD-10-CM | POA: Diagnosis not present

## 2018-02-24 DIAGNOSIS — R1310 Dysphagia, unspecified: Secondary | ICD-10-CM | POA: Diagnosis not present

## 2018-02-24 MED ORDER — SODIUM CHLORIDE 0.9 % IV SOLN: 500.0000 mL | Freq: Once | INTRAVENOUS | Status: DC

## 2018-02-24 NOTE — Patient Instructions (Signed)
Discharge instructions given. Resume blood thinner today. Continue antibiotics. YOU HAD AN ENDOSCOPIC PROCEDURE TODAY AT [facility]:   Refer to the procedure report that was given to you for any specific questions about what was found during the examination.  If the procedure report does not answer your questions, please call your gastroenterologist to clarify.  If you requested that your care partner not be given the details of your procedure findings, then the procedure report has been included in a sealed envelope for you to review at your convenience later.  YOU SHOULD EXPECT: Some feelings of bloating in the abdomen. Passage of more gas than usual.  Walking can help get rid of the air that was put into your GI tract during the procedure and reduce the bloating. If you had a lower endoscopy (such as a colonoscopy or flexible sigmoidoscopy) you may notice spotting of blood in your stool or on the toilet paper. If you underwent a bowel prep for your procedure, you may not have a normal bowel movement for a few days.  Please Note:  You might notice some irritation and congestion in your nose or some drainage.  This is from the oxygen used during your procedure.  There is no need for concern and it should clear up in a day or so.  SYMPTOMS TO REPORT IMMEDIATELY:   Following lower endoscopy (colonoscopy or flexible sigmoidoscopy):  Excessive amounts of blood in the stool  Significant tenderness or worsening of abdominal pains  Swelling of the abdomen that is new, acute  Fever of 100F or higher For urgent or emergent issues, a gastroenterologist can be reached at any hour by calling [phone].   DIET:  We do recommend a small meal at first, but then you may proceed to your regular diet.  Drink plenty of fluids but you should avoid alcoholic beverages for 24 hours.  ACTIVITY:  You should plan to take it easy for the rest of today and you should NOT DRIVE or use heavy  machinery until tomorrow (because of the sedation medicines used during the test).    FOLLOW UP: Our staff will call the number listed on your records the next business day following your procedure to check on you and address any questions or concerns that you may have regarding the information given to you following your procedure. If we do not reach you, we will leave a message.  However, if you are feeling well and you are not experiencing any problems, there is no need to return our call.  We will assume that you have returned to your regular daily activities without incident.  If any biopsies were taken you will be contacted by phone or by letter within the next 1-3 weeks.  Please call us at [phone] if you have not heard about the biopsies in 3 weeks.    SIGNATURES/CONFIDENTIALITY: You and/or your care partner have signed paperwork which will be entered into your electronic medical record.  These signatures attest to the fact that that the information above on your After Visit Summary has been reviewed and is understood.  Full responsibility of the confidentiality of this discharge information lies with you and/or your care-partner.

## 2018-02-24 NOTE — Progress Notes (Signed)
Called to room to assist during endoscopic procedure.  Patient ID and intended procedure confirmed with present staff. Received instructions for my participation in the procedure from the performing physician.  

## 2018-02-24 NOTE — Progress Notes (Signed)
To PACU, VSS. Report to [email or web address] 

## 2018-02-24 NOTE — Op Note (Signed)
[facility] Patient Name: [name] Procedure Date: 02/24/2018 9:13 AM MRN: [id number] Endoscopist: Milus Banister , MD Age: 62 Referring MD:  Date of Birth: 22-Aug-1956 Gender: Female Account #: [id number] Procedure:                Upper GI endoscopy Indications:              Dysphagia; mid to proximal esophageal narrowing                            with a few associated AVMs, presumed site of                            chronic radiation damage (remoted lung cancer) Medicines:                Monitored Anesthesia Care Procedure:                Pre-Anesthesia Assessment:                           - Prior to the procedure, a History and Physical                            was performed, and patient medications and                            allergies were reviewed. The patient's tolerance of                            previous anesthesia was also reviewed. The risks                            and benefits of the procedure and the sedation                            options and risks were discussed with the patient.                            All questions were answered, and informed consent                            was obtained. Prior Anticoagulants: The patient has                            taken Eliquis (apixaban), last dose was 2 days                            prior to procedure. ASA Grade Assessment: III - A                            patient with severe systemic disease. After                            reviewing the risks and benefits, the patient was  deemed in satisfactory condition to undergo the                            procedure.                           After obtaining informed consent, the endoscope was                            passed under direct vision. Throughout the                            procedure, the patient's blood pressure, pulse, and                            oxygen saturations were monitored continuously. The                           Endoscope was introduced through the mouth, and                            advanced to the second part of duodenum. The upper                            GI endoscopy was accomplished without difficulty.                            The patient tolerated the procedure well. Scope In: Scope Out: Findings:                 One benign-appearing, intrinsic mild stenosis was                            found in the mid to proximal esophagus. The                            esophagus was a bit tortuous through this region                            and there were a few small associated AVMs. A TTS                            dilator was passed through the scope. Dilation with                            an 18-19-20 mm balloon and an 18-19-20 mm balloon                            dilator was performed to 20 mm.                           Known proximal gastric submucosal lesion, unchanged  appearing.                           The exam was otherwise without abnormality. Complications:            No immediate complications. Estimated blood loss:                            None. Estimated Blood Loss:     Estimated blood loss: none. 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                            Presumed to be the site of radiation related damage                            to the esophagus. Tortuosity of the lumen and                            perhaps segmental motility disorder can contribute                            to your swallowing trouble. This region was dilated                            with balloon dilator. Recommendation:           - Patient has a contact number available for                            emergencies. The signs and symptoms of potential                            delayed complications were discussed with the                            patient. Return to normal activities tomorrow.                             Written discharge instructions were provided to the                            patient.                           - Resume previous diet.                           - Continue present medications. Resume your blood                            thinner today.                           - Dr. Ardis Hughs' office will contact you about office  visit to discuss your recent diverticulitis,                            continue your antibiotics fow now. Milus Banister, MD 02/24/2018 9:34:33 AM This report has been signed electronically.

## 2018-02-25 ENCOUNTER — Telehealth: Payer: Self-pay

## 2018-02-25 NOTE — Telephone Encounter (Signed)
  Follow up Call-  Call back number 02/24/2018 01/10/2016  Post procedure Call Back phone  # [phone]  Permission to leave phone message Yes Yes  Some recent data might be hidden     Patient questions:  Do you have a fever, pain , or abdominal swelling? No. Pain Score  0 *  Have you tolerated food without any problems? Yes.    Have you been able to return to your normal activities? Yes.    Do you have any questions about your discharge instructions: Diet   No. Medications  No. Follow up visit  No.  Do you have questions or concerns about your Care? No.  Actions: * If pain score is 4 or above: No action needed, pain <4.

## 2018-02-28 IMAGING — DX DG CHEST 2V
2 series · 2 of 2 positions shown · non-contrast
Comparison: Chest radiographs 08/22/2015 and earlier. Chest CT
05/30/2015 and earlier.

CLINICAL DATA: 59-year-old female with sudden onset left neck pain
radiating to the arm for 2 days with nausea and mid chest
discomfort. Initial encounter. Personal history of lung cancer

EXAM:
CHEST  2 VIEW

[w chest pa]
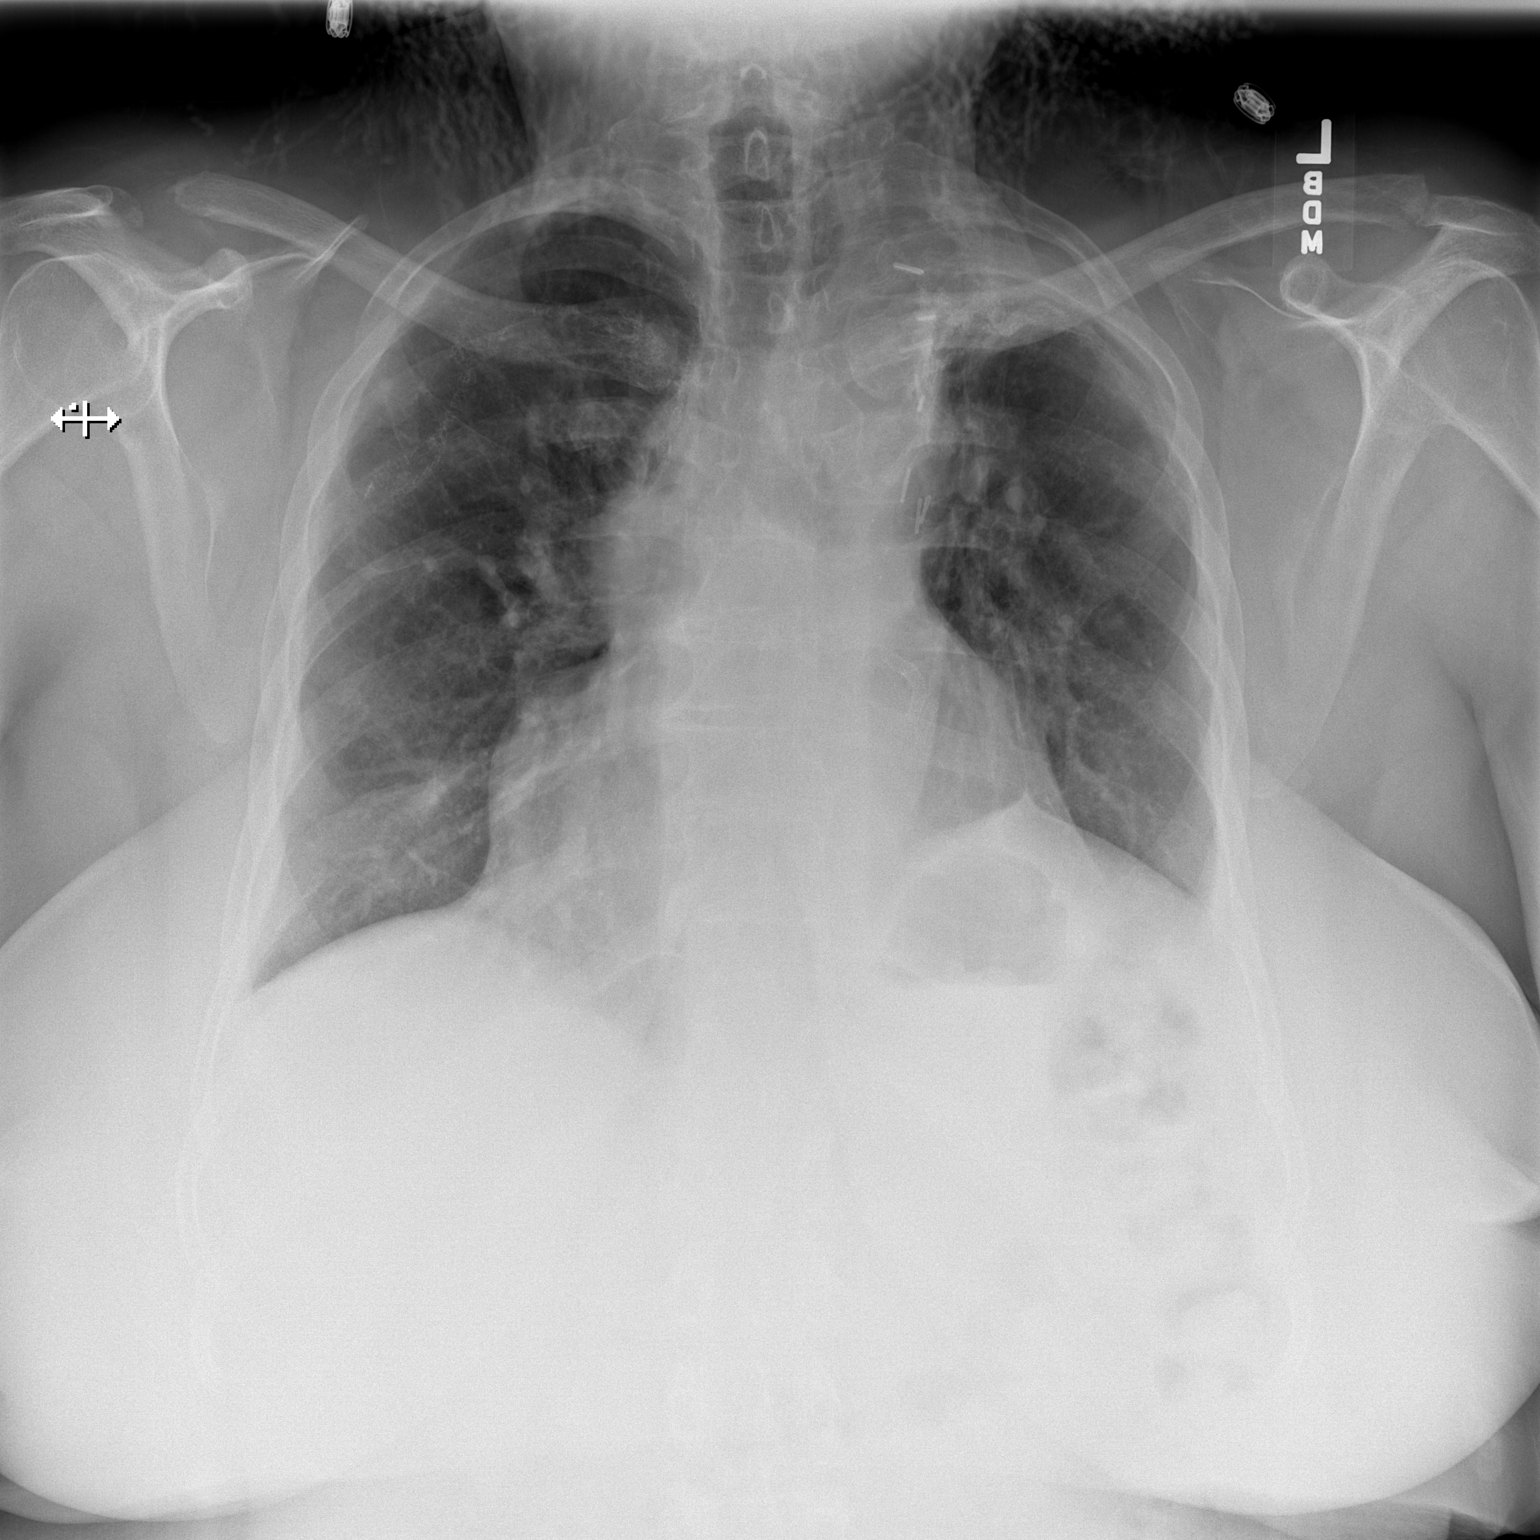

[w chest lat]
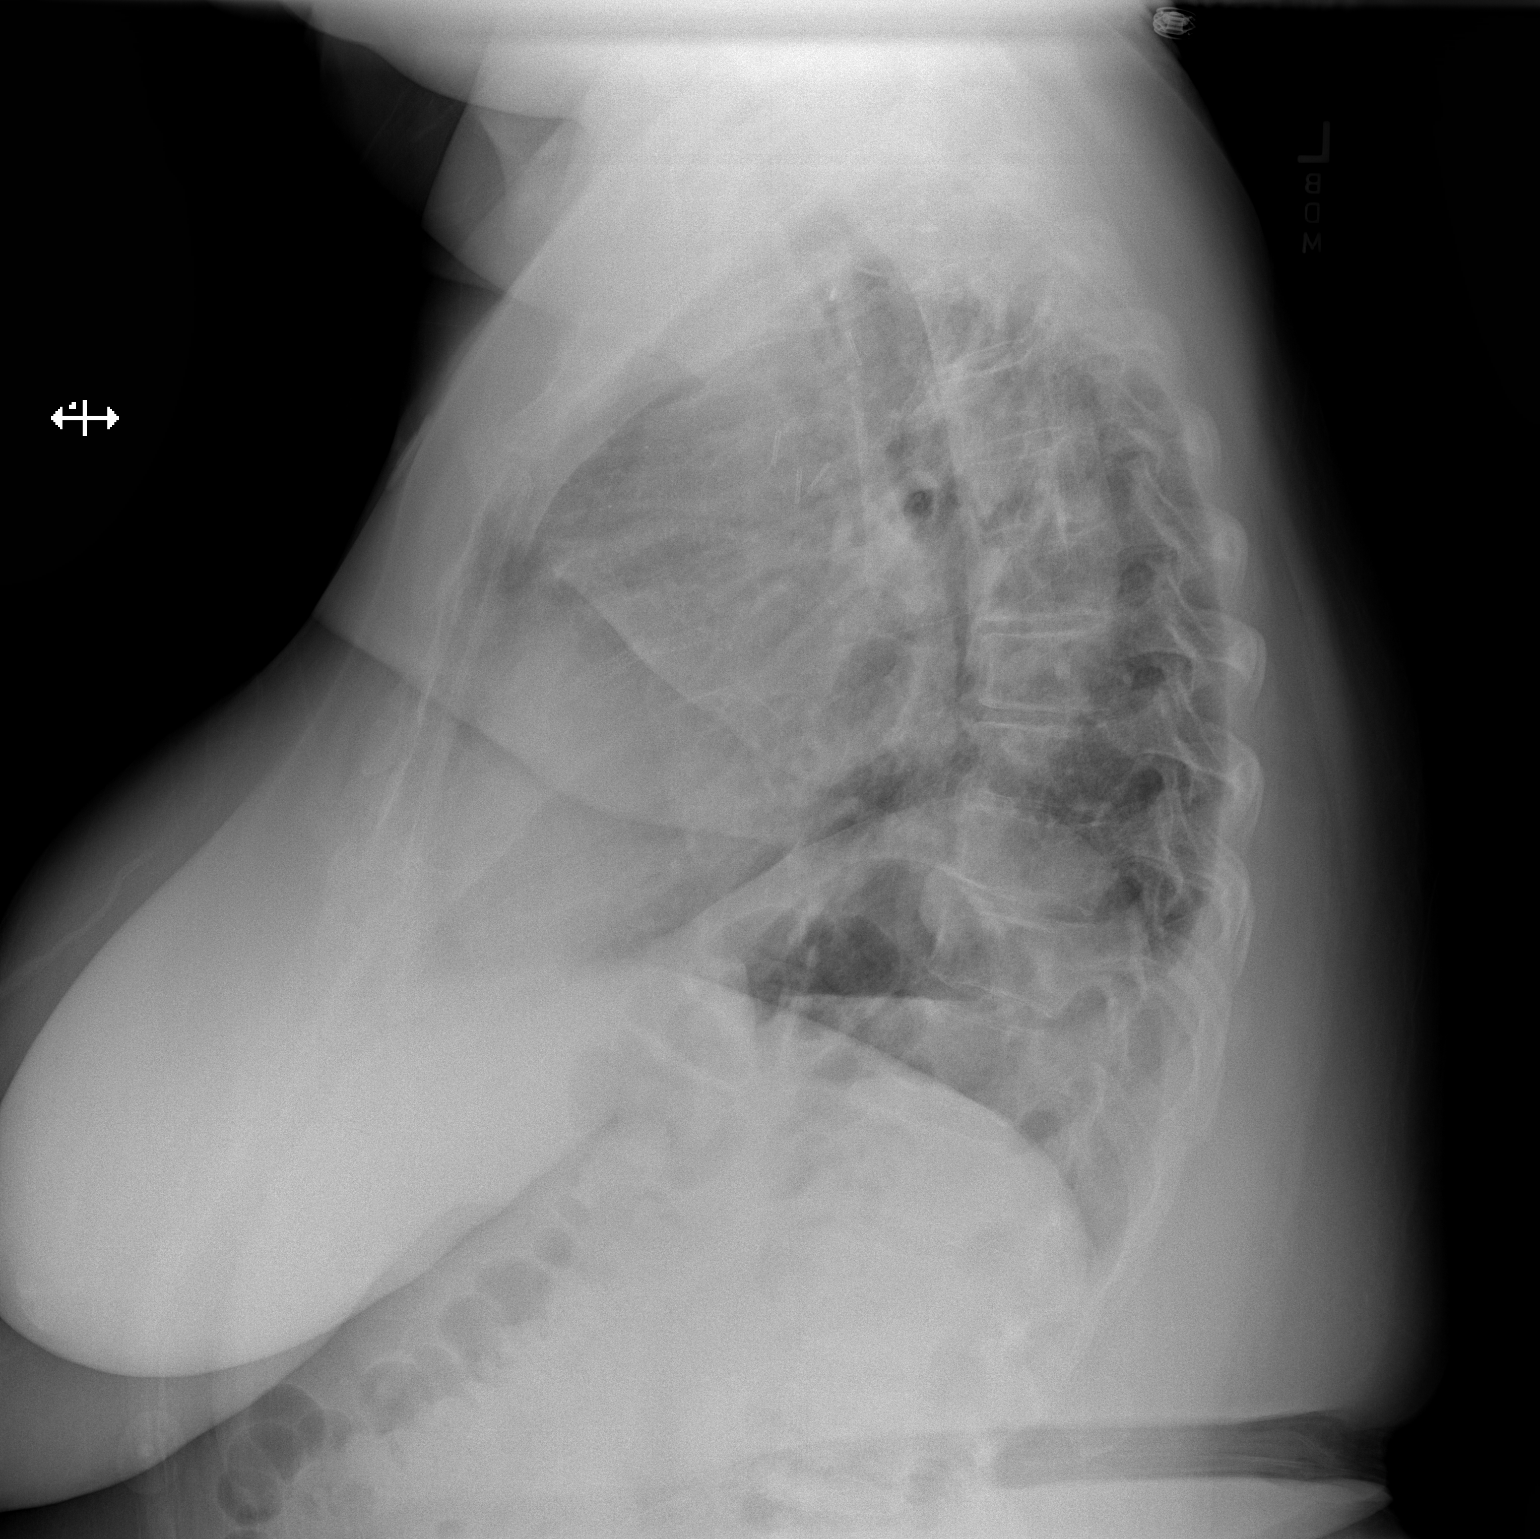

[2 of 2 positions shown; findings below may reference images not displayed]

FINDINGS: Extensive postoperative changes to both lung apices, and the left
mediastinum. Chronic left apical opacification. Lung volumes have
not significantly changed since 8375. Stable mediastinal contours.
Tracheal air column appears stable and within normal limits. No
pneumothorax, pulmonary edema, pleural effusion or acute pulmonary
opacity. Stable visualized osseous structures.
IMPRESSION: Stable post treatment appearance of the chest since 8375. No new
cardiopulmonary abnormality identified.

## 2018-03-11 ENCOUNTER — Other Ambulatory Visit: Payer: Self-pay | Admitting: Cardiology

## 2018-03-20 ENCOUNTER — Telehealth: Payer: Self-pay | Admitting: *Deleted

## 2018-03-20 NOTE — Telephone Encounter (Signed)
Faxed ROI to Glendale Chard MD; release [phone]

## 2018-03-31 ENCOUNTER — Telehealth: Payer: Self-pay | Admitting: Gastroenterology

## 2018-03-31 NOTE — Telephone Encounter (Signed)
Left message on machine to call back  

## 2018-04-01 MED ORDER — CIPROFLOXACIN HCL 500 MG PO TABS: 500.0000 mg | ORAL_TABLET | Freq: Two times a day (BID) | ORAL | 0 refills | Status: DC

## 2018-04-01 MED ORDER — METRONIDAZOLE 250 MG PO TABS: 250.0000 mg | ORAL_TABLET | Freq: Three times a day (TID) | ORAL | 0 refills | Status: DC

## 2018-04-01 NOTE — Telephone Encounter (Signed)
On 4/30 the pt was seen in the ED for what she thought was kidney stone pain left flank and abd pain for several days.  She was imaged and no renal stone seen.  Incidental finding of mild diverticulitis.  The pt was started on cipro and flagyl x 7 days and improved somewhat.  She states now that she is beginning to have the same left flank pain she had prior to the ED visit.  She has an appt on 04/28/18 with Dr Ardis Hughs.  Do you want her on another 7 day course of abx and keep the appt as scheduled or do you want her to be seen sooner.  Additional labs?  Please advise

## 2018-04-01 NOTE — Telephone Encounter (Signed)
Yes, cipro 500mg  twice daily.  Flagyl 250mg  tid.  Both for 10 days.  No refills. thanks

## 2018-04-01 NOTE — Telephone Encounter (Signed)
The pt has been advised of the information and verbalized understanding.    

## 2018-04-02 ENCOUNTER — Telehealth: Payer: Self-pay | Admitting: Gastroenterology

## 2018-04-02 ENCOUNTER — Other Ambulatory Visit: Payer: Self-pay

## 2018-04-02 MED ORDER — CIPROFLOXACIN HCL 500 MG PO TABS: 500.0000 mg | ORAL_TABLET | Freq: Two times a day (BID) | ORAL | 0 refills | Status: AC

## 2018-04-02 MED ORDER — METRONIDAZOLE 250 MG PO TABS: 250.0000 mg | ORAL_TABLET | Freq: Three times a day (TID) | ORAL | 0 refills | Status: AC

## 2018-04-02 NOTE — Telephone Encounter (Signed)
The prescription was printed in error instead of e prescribed.  I resent it and confirmed with the pt that the pharmacy received it.

## 2018-04-18 ENCOUNTER — Other Ambulatory Visit: Payer: Self-pay | Admitting: Cardiology

## 2018-04-18 DIAGNOSIS — I308 Other forms of acute pericarditis: Secondary | ICD-10-CM

## 2018-04-28 ENCOUNTER — Encounter: Payer: Self-pay | Admitting: Gastroenterology

## 2018-04-28 ENCOUNTER — Ambulatory Visit: Payer: Medicare Other | Admitting: Gastroenterology

## 2018-04-28 ENCOUNTER — Telehealth: Payer: Self-pay | Admitting: Gastroenterology

## 2018-04-28 VITALS — BP 110/64 | HR 70 | Ht 67.5 in | Wt 250.0 lb

## 2018-04-28 DIAGNOSIS — K5732 Diverticulitis of large intestine without perforation or abscess without bleeding: Secondary | ICD-10-CM

## 2018-04-28 MED ORDER — PEG 3350-KCL-NA BICARB-NACL 420 G PO SOLR: 4000.0000 mL | ORAL | 0 refills | Status: DC

## 2018-04-28 NOTE — Patient Instructions (Addendum)
You will be set up for a colonoscopy for diverticultitis, left flank pain. Hold eliquis for 1 day prior, we will discuss with your cardilogist to make sure that is safe.  Normal BMI (Body Mass Index- based on height and weight) is between 19 and 25. Your BMI today is Body mass index is 38.58 kg/m. [name] Please consider follow up  regarding your BMI with your Primary Care Provider.

## 2018-04-28 NOTE — Telephone Encounter (Signed)
Pt takes Eliquis for afib with CHADS2VASc score of 5 (sex, HTN, CAD, CHF, DM). Renal function is normal. Recommend holding Eliquis for 24 hours prior to procedure.

## 2018-04-28 NOTE — Progress Notes (Signed)
Review of gastrointestinal problems:  1. Routine risk for colon cancer;Feb 2009 colonoscopy with dr. Lajoyce Corners, no polyps. Next colonoscopy should be 11/2017; 09/2011 repeat colonoscopy Dr. Ardis Hughs for change in bowels (looser) found L sided diverticulosis, mucosa normal (no polyps or cancer), random biospies no microscopic colitis. 2. submucosal gastric lesion, first noted early 2009. Endoscopic ultrasound procedures have been performed, none with firm tissue diagnosis, next Examination at 3 year interval given stability, small size of lesion. Repeat endoscopic ultrasound 2010, discrete submucosal lesion, 1.8 cm. Repeat evaluation was set for 3 year interval. EGD 07/2012 Dr. Ardis Hughs showed nodule has not changed in size, recommended recall again at 3 years. EGD 12/2015 Dr. Ardis Hughs no change in size; 1.8cm "the mass in proximal stomach has not changed in 8 years of surveillance." 3. intermittent dysphagia since radiation, chemotherapy for lung cancer.Barium esophagram 2009 showed decreased peristalsis but no discrete strictures.Dr. Beryle Beams, Dr. Arlyce Dice, originally diagnosed in 2001.  EGD 11/2017:  3-4cm slightly narrowed segment in proximal to mid esophagus with AVMs associated. Repeat EGD 02/2018 dilated the mid to proximal stricture to 42mm with balloon. 4. Acute diverticulitis: presented to ER with left sided abd pain, CT (stone protocol) 01/2018 "mild sigmoid diverticultiis with pericolonoic inflammatory changes" treated with oral cipro/flagyl. WBC 11K.  Symptoms recurred 2 months later, same abx given.   HPI: This is a very pleasant 62 year old woman whom I last saw around the time of an upper endoscopy 2 or 3 months ago.  She is here today for a different problem  She had left flank pains and presented to the emergency room.  Her white blood cell count was elevated at 11,000 and a stone protocol CT scan suggested that she has sigmoid diverticulitis.  Mild.  She completed a 7-day course of Cipro and Flagyl  and within 2 days her pains improved.  The pains recurred about 1 month later and she called here.  They were again in her left flank, really left back.  I called her in a repeat prescription for the Cipro and Flagyl and her pains improved within 3 or 4 days.  She still has some mild discomforts in her left flank, left back.  These are somewhat positional.  She will sometimes have her husband massage the area and they will improve.  She has had no fevers or chills.  Chief complaint is sigmoid diverticulitis  ROS: complete GI ROS as described in HPI, all other review negative.  Constitutional:  No unintentional weight loss   Past Medical History:  Diagnosis Date  . Allergy   . Anemia   . Anemia   . Aortic insufficiency    a. Mod by echo 05/2015.  . Arthritis    "knees" (07/11/2015)  . Atrial fibrillation (HCC)    On Eliquis  . 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 Complication of anesthesia    "I had a hard time waking me up w/one of my shoulder surgeries"  . Coronary artery disease    a. Abnormal stress test -> LHc 06/2015 s/p DES to mCX and mLAD, residual D2 disease treated medially.  . Diverticulitis   . Elevated blood pressure   . Erythema nodosum   . Heart murmur   . Insomnia   . lung ca dx'd 10/1999   chemo/xrt comp 05/29/2000  . Myalgia   . Nephrolithiasis 2015  . Obesity   . Pericardial effusion  a. 05/2015 Echo: EF 55-60%, no rwma, Gr 1 DD, no effusion - but an effusion was seen on CT which was felt to be increased in size compared to 12/2014 - pericardium also thickened.   . Pericarditis    a. Dx 05/2015.  [name] Pneumothorax, spontaneous, tension 01/20/2012   October, 2010  . Pulmonary fibrosis ([place]) 01/20/2012   Due to previous surgery and chest radiation for lung cancer  . Restrictive lung disease   . Type II diabetes mellitus ([place])     Past Surgical  History:  Procedure Laterality Date  . ABDOMINAL HYSTERECTOMY  2008  . APPENDECTOMY  1969?  [name] CARDIAC CATHETERIZATION N/A 07/11/2015   Procedure: Left Heart Cath and Coronary Angiography;  Surgeon: Jettie Booze, MD;  Location: [facility];  Service: Cardiovascular;  Laterality: N/A;  . CARDIAC CATHETERIZATION  07/11/2015   Procedure: Coronary Stent Intervention;  Surgeon: Jettie Booze, MD;  Location: [facility];  Service: Cardiovascular;;  . CARPAL TUNNEL RELEASE Bilateral 1998-2005?   right-left  . CARPAL TUNNEL RELEASE Left 2009?   "for the 2nd time"  . COLONOSCOPY    . CORONARY ANGIOPLASTY    . CYSTOSCOPY W/ STONE MANIPULATION  2013  . ESOPHAGOGASTRODUODENOSCOPY (EGD) WITH ESOPHAGEAL DILATION  2012  . FOOT SURGERY Bilateral 1980's   Callous removed   . INNER EAR SURGERY Right ~ 2009  . KNEE ARTHROPLASTY Right 1991  . KNEE ARTHROSCOPY  ~ 2000   LATERAL RELEASE  . LEFT HEART CATH AND CORONARY ANGIOGRAPHY N/A 12/18/2016   Procedure: Left Heart Cath and Coronary Angiography;  Surgeon: Burnell Blanks, MD;  Location: [facility];  Service: Cardiovascular;  Laterality: N/A;  . LUNG CANCER SURGERY Left 2001   "small cell"  . LUNG SURGERY Right 2007   "reinflated it"  . PULMONARY EMBOLISM SURGERY  2007   LUNG COLLAPSE  . RIGHT AND LEFT HEART CATH N/A 06/25/2017   Procedure: RIGHT AND LEFT HEART CATH;  Surgeon: Sherren Mocha, MD;  Location: [facility];  Service: Cardiovascular;  Laterality: N/A;  . SHOULDER ADHESION RELEASE Right ~ 2004  . SHOULDER ARTHROSCOPY W/ ROTATOR CUFF REPAIR Right ~ 2003    Current Outpatient Medications  Medication Sig Dispense Refill  . apixaban (ELIQUIS) 5 MG TABS tablet TAKE 1 TABLET BY MOUTH 2 (TWO) TIMES DAILY. 180 tablet 1  . B-D UF III MINI PEN NEEDLES 31G X 5 MM MISC 2 (two) times daily. as directed  5  . Coenzyme Q10 50 MG CAPS Take 1 capsule (50 mg total) by mouth daily. 30 capsule 3  .  cyclobenzaprine (FLEXERIL) 10 MG tablet Take 10 mg by mouth at bedtime as needed.  0  . diltiazem (CARDIZEM CD) 240 MG 24 hr capsule TAKE 1 CAPSULE BY MOUTH EVERY DAY 90 capsule 2  . furosemide (LASIX) 40 MG tablet Take 1 tablet (40 mg total) by mouth 2 (two) times daily. 180 tablet 0  . HUMALOG MIX 75/25 KWIKPEN (75-25) 100 UNIT/ML Kwikpen Inject into the skin 2 (two) times daily. Inject 50 units sq in Am and 30 units subque in PM  6  . KLOR-CON M20 20 MEQ tablet TAKE 1 TABLET BY MOUTH EVERY DAY 90 tablet 1  . levothyroxine (SYNTHROID, LEVOTHROID) 50 MCG tablet Take 50 mcg by mouth daily.  6  . losartan (COZAAR) 25 MG tablet TAKE 1 TABLET (25 MG TOTAL) BY MOUTH DAILY. 90 tablet 3  . meloxicam (MOBIC) 15 MG  tablet Take 1 tablet by mouth daily with breakfast.  1  . metoprolol tartrate (LOPRESSOR) 25 MG tablet TAKE 1 TABLET (25 MG TOTAL) BY MOUTH 2 (TWO) TIMES DAILY. 180 tablet 3  . nitroGLYCERIN (NITROSTAT) 0.4 MG SL tablet Place 1 tablet (0.4 mg total) under the tongue every 5 (five) minutes as needed for chest pain. (Patient not taking: Reported on 02/24/2018) 25 tablet 3  . ondansetron (ZOFRAN ODT) 4 MG disintegrating tablet Take 1 tablet (4 mg total) by mouth every 8 (eight) hours as needed for nausea or vomiting. 8 tablet 0  . ONETOUCH VERIO test strip USE AS DIRECTED 3 TIMES A DAY  4  . oxyCODONE-acetaminophen (PERCOCET/ROXICET) 5-325 MG tablet Take 1-2 tablets by mouth every 4 (four) hours as needed for severe pain. 12 tablet 0  . pravastatin (PRAVACHOL) 20 MG tablet TAKE 1 TABLET BY MOUTH EVERY DAY IN THE EVENING 30 tablet 10  . spironolactone (ALDACTONE) 25 MG tablet Take 1 tablet (25 mg total) by mouth daily. 90 tablet 1   Current Facility-Administered Medications  Medication Dose Route Frequency Provider Last Rate Last Dose  . 0.9 %  sodium chloride infusion  500 mL Intravenous Once Milus Banister, MD      . 0.9 %  sodium chloride infusion  500 mL Intravenous Once Milus Banister, MD         Allergies as of 04/28/2018 - Review Complete 04/28/2018  Allergen Reaction Noted  . Hydrocodone Hives and Itching   . Hydrocodone-acetaminophen Hives 07/23/2017  . Morphine and related Other (See Comments) 11/09/2011  . Rosuvastatin Other (See Comments) 01/19/2018  . Lisinopril Other (See Comments) and Cough 07/25/2015  . Nsaids Other (See Comments) 11/09/2011    Family History  Problem Relation Age of Onset  . Heart disease Father   . Diabetes Father   . Pancreatic cancer Mother   . Diabetes Mother   . Hypertension Sister        x 3  . Diabetes Sister        x 4  . Diabetes Maternal Grandmother   . Diabetes Paternal Grandmother   . Diabetes Sister   . Diabetes Sister   . Diabetes Sister   . Kidney disease Sister   . Colon cancer Neg Hx   . Colon polyps Neg Hx   . Rectal cancer Neg Hx   . Stomach cancer Neg Hx   . Esophageal cancer Neg Hx   . Breast cancer Neg Hx     Social History   Socioeconomic History  . Marital status: Single    Spouse name: Not on file  . Number of children: 0  . Years of education: Not on file  . Highest education level: Not on file  Occupational History  . Occupation: RETIRED  Social Needs  . Financial resource strain: Not on file  . Food insecurity:    Worry: Not on file    Inability: Not on file  . Transportation needs:    Medical: Not on file    Non-medical: Not on file  Tobacco Use  . Smoking status: Former Smoker    Packs/day: 0.25    Years: 5.00    Pack years: 1.25    Types: Cigarettes    Last attempt to quit: 07/22/1999    Years since quitting: 18.7  . Smokeless tobacco: Never Used  Substance and Sexual Activity  . Alcohol use: No    Alcohol/week: 0.0 oz  . Drug use: No  .  Sexual activity: Not Currently    Birth control/protection: Other-see comments    Comment: hysterectomy  Lifestyle  . Physical activity:    Days per week: Not on file    Minutes per session: Not on file  . Stress: Not on file   Relationships  . Social connections:    Talks on phone: Not on file    Gets together: Not on file    Attends religious service: Not on file    Active member of club or organization: Not on file    Attends meetings of clubs or organizations: Not on file    Relationship status: Not on file  . Intimate partner violence:    Fear of current or ex partner: Not on file    Emotionally abused: Not on file    Physically abused: Not on file    Forced sexual activity: Not on file  Other Topics Concern  . Not on file  Social History Narrative  . Not on file     Physical Exam: BP 110/64   Pulse 70   Ht 5' 7.5" (1.715 m)   Wt 250 lb (113.4 kg)   BMI 38.58 kg/m  Constitutional: generally well-appearing Psychiatric: alert and oriented x3 Abdomen: soft, nontender, nondistended, no obvious ascites, no peritoneal signs, normal bowel sounds No peripheral edema noted in lower extremities  Assessment and plan: 62 y.o. female with sigmoid diverticulitis  I reviewed her CT scan images and she does clearly have some inflammation in the sigmoid colon.  It is pretty close to her left iliopsoas and so possibly that is why she is feeling most of the pains in the back.  Her last colonoscopy was a little over 7 years ago and I recommended we repeat that now to make sure we are not missing something else such as a neoplasm.  She takes Eliquis for atrial fibrillation and we will communicate with her cardiologist about the safety of her holding that medicine for 1 day prior to the colonoscopy.  I see no reason for any further blood tests or imaging studies prior to then.  Please see the "Patient Instructions" section for addition details about the plan.  Owens Loffler, MD [facility] 04/28/2018, 8:35 AM

## 2018-04-28 NOTE — Telephone Encounter (Signed)
[facility] Pre-operative Risk Assessment     Request for surgical clearance:     Endoscopy Procedure  What type of surgery is being performed?     Colonoscopy  When is this surgery scheduled?     05/04/18  What type of clearance is required ?   Pharmacy  Are there any medications that need to be held prior to surgery and how long? Eliquis  Practice name and name of physician performing surgery?      [facility]  What is your office phone and fax number?      Phone- [phone]  [phone]  Anesthesia type (None, local, MAC, general) ?       MAC

## 2018-04-28 NOTE — Telephone Encounter (Signed)
Pharm please address eliquis

## 2018-04-28 NOTE — H&P (View-Only) (Signed)
Review of gastrointestinal problems:  1. Routine risk for colon cancer;Feb 2009 colonoscopy with dr. Lajoyce Corners, no polyps. Next colonoscopy should be 11/2017; 09/2011 repeat colonoscopy Dr. Ardis Hughs for change in bowels (looser) found L sided diverticulosis, mucosa normal (no polyps or cancer), random biospies no microscopic colitis. 2. submucosal gastric lesion, first noted early 2009. Endoscopic ultrasound procedures have been performed, none with firm tissue diagnosis, next Examination at 3 year interval given stability, small size of lesion. Repeat endoscopic ultrasound 2010, discrete submucosal lesion, 1.8 cm. Repeat evaluation was set for 3 year interval. EGD 07/2012 Dr. Ardis Hughs showed nodule has not changed in size, recommended recall again at 3 years. EGD 12/2015 Dr. Ardis Hughs no change in size; 1.8cm "the mass in proximal stomach has not changed in 8 years of surveillance." 3. intermittent dysphagia since radiation, chemotherapy for lung cancer.Barium esophagram 2009 showed decreased peristalsis but no discrete strictures.Dr. Beryle Beams, Dr. Arlyce Dice, originally diagnosed in 2001.  EGD 11/2017:  3-4cm slightly narrowed segment in proximal to mid esophagus with AVMs associated. Repeat EGD 02/2018 dilated the mid to proximal stricture to 32mm with balloon. 4. Acute diverticulitis: presented to ER with left sided abd pain, CT (stone protocol) 01/2018 "mild sigmoid diverticultiis with pericolonoic inflammatory changes" treated with oral cipro/flagyl. WBC 11K.  Symptoms recurred 2 months later, same abx given.   HPI: This is a very pleasant 62 year old woman whom I last saw around the time of an upper endoscopy 2 or 3 months ago.  She is here today for a different problem  She had left flank pains and presented to the emergency room.  Her white blood cell count was elevated at 11,000 and a stone protocol CT scan suggested that she has sigmoid diverticulitis.  Mild.  She completed a 7-day course of Cipro and Flagyl  and within 2 days her pains improved.  The pains recurred about 1 month later and she called here.  They were again in her left flank, really left back.  I called her in a repeat prescription for the Cipro and Flagyl and her pains improved within 3 or 4 days.  She still has some mild discomforts in her left flank, left back.  These are somewhat positional.  She will sometimes have her husband massage the area and they will improve.  She has had no fevers or chills.  Chief complaint is sigmoid diverticulitis  ROS: complete GI ROS as described in HPI, all other review negative.  Constitutional:  No unintentional weight loss   Past Medical History:  Diagnosis Date  . Allergy   . Anemia   . Anemia   . Aortic insufficiency    a. Mod by echo 05/2015.  . Arthritis    "knees" (07/11/2015)  . Atrial fibrillation (HCC)    On Eliquis  . 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 Complication of anesthesia    "I had a hard time waking me up w/one of my shoulder surgeries"  . Coronary artery disease    a. Abnormal stress test -> LHc 06/2015 s/p DES to mCX and mLAD, residual D2 disease treated medially.  . Diverticulitis   . Elevated blood pressure   . Erythema nodosum   . Heart murmur   . Insomnia   . lung ca dx'd 10/1999   chemo/xrt comp 05/29/2000  . Myalgia   . Nephrolithiasis 2015  . Obesity   . Pericardial effusion  a. 05/2015 Echo: EF 55-60%, no rwma, Gr 1 DD, no effusion - but an effusion was seen on CT which was felt to be increased in size compared to 12/2014 - pericardium also thickened.   . Pericarditis    a. Dx 05/2015.  [name] Pneumothorax, spontaneous, tension 01/20/2012   October, 2010  . Pulmonary fibrosis ([place]) 01/20/2012   Due to previous surgery and chest radiation for lung cancer  . Restrictive lung disease   . Type II diabetes mellitus ([place])     Past Surgical  History:  Procedure Laterality Date  . ABDOMINAL HYSTERECTOMY  2008  . APPENDECTOMY  1969?  [name] CARDIAC CATHETERIZATION N/A 07/11/2015   Procedure: Left Heart Cath and Coronary Angiography;  Surgeon: Jettie Booze, MD;  Location: [facility];  Service: Cardiovascular;  Laterality: N/A;  . CARDIAC CATHETERIZATION  07/11/2015   Procedure: Coronary Stent Intervention;  Surgeon: Jettie Booze, MD;  Location: [facility];  Service: Cardiovascular;;  . CARPAL TUNNEL RELEASE Bilateral 1998-2005?   right-left  . CARPAL TUNNEL RELEASE Left 2009?   "for the 2nd time"  . COLONOSCOPY    . CORONARY ANGIOPLASTY    . CYSTOSCOPY W/ STONE MANIPULATION  2013  . ESOPHAGOGASTRODUODENOSCOPY (EGD) WITH ESOPHAGEAL DILATION  2012  . FOOT SURGERY Bilateral 1980's   Callous removed   . INNER EAR SURGERY Right ~ 2009  . KNEE ARTHROPLASTY Right 1991  . KNEE ARTHROSCOPY  ~ 2000   LATERAL RELEASE  . LEFT HEART CATH AND CORONARY ANGIOGRAPHY N/A 12/18/2016   Procedure: Left Heart Cath and Coronary Angiography;  Surgeon: Burnell Blanks, MD;  Location: [facility];  Service: Cardiovascular;  Laterality: N/A;  . LUNG CANCER SURGERY Left 2001   "small cell"  . LUNG SURGERY Right 2007   "reinflated it"  . PULMONARY EMBOLISM SURGERY  2007   LUNG COLLAPSE  . RIGHT AND LEFT HEART CATH N/A 06/25/2017   Procedure: RIGHT AND LEFT HEART CATH;  Surgeon: Sherren Mocha, MD;  Location: [facility] [place];  Service: Cardiovascular;  Laterality: N/A;  . SHOULDER ADHESION RELEASE Right ~ 2004  . SHOULDER ARTHROSCOPY W/ ROTATOR CUFF REPAIR Right ~ 2003    Current Outpatient Medications  Medication Sig Dispense Refill  . apixaban (ELIQUIS) 5 MG TABS tablet TAKE 1 TABLET BY MOUTH 2 (TWO) TIMES DAILY. 180 tablet 1  . B-D UF III MINI PEN NEEDLES 31G X 5 MM MISC 2 (two) times daily. as directed  5  . Coenzyme Q10 50 MG CAPS Take 1 capsule (50 mg total) by mouth daily. 30 capsule 3  .  cyclobenzaprine (FLEXERIL) 10 MG tablet Take 10 mg by mouth at bedtime as needed.  0  . diltiazem (CARDIZEM CD) 240 MG 24 hr capsule TAKE 1 CAPSULE BY MOUTH EVERY DAY 90 capsule 2  . furosemide (LASIX) 40 MG tablet Take 1 tablet (40 mg total) by mouth 2 (two) times daily. 180 tablet 0  . HUMALOG MIX 75/25 KWIKPEN (75-25) 100 UNIT/ML Kwikpen Inject into the skin 2 (two) times daily. Inject 50 units sq in Am and 30 units subque in PM  6  . KLOR-CON M20 20 MEQ tablet TAKE 1 TABLET BY MOUTH EVERY DAY 90 tablet 1  . levothyroxine (SYNTHROID, LEVOTHROID) 50 MCG tablet Take 50 mcg by mouth daily.  6  . losartan (COZAAR) 25 MG tablet TAKE 1 TABLET (25 MG TOTAL) BY MOUTH DAILY. 90 tablet 3  . meloxicam (MOBIC) 15 MG  tablet Take 1 tablet by mouth daily with breakfast.  1  . metoprolol tartrate (LOPRESSOR) 25 MG tablet TAKE 1 TABLET (25 MG TOTAL) BY MOUTH 2 (TWO) TIMES DAILY. 180 tablet 3  . nitroGLYCERIN (NITROSTAT) 0.4 MG SL tablet Place 1 tablet (0.4 mg total) under the tongue every 5 (five) minutes as needed for chest pain. (Patient not taking: Reported on 02/24/2018) 25 tablet 3  . ondansetron (ZOFRAN ODT) 4 MG disintegrating tablet Take 1 tablet (4 mg total) by mouth every 8 (eight) hours as needed for nausea or vomiting. 8 tablet 0  . ONETOUCH VERIO test strip USE AS DIRECTED 3 TIMES A DAY  4  . oxyCODONE-acetaminophen (PERCOCET/ROXICET) 5-325 MG tablet Take 1-2 tablets by mouth every 4 (four) hours as needed for severe pain. 12 tablet 0  . pravastatin (PRAVACHOL) 20 MG tablet TAKE 1 TABLET BY MOUTH EVERY DAY IN THE EVENING 30 tablet 10  . spironolactone (ALDACTONE) 25 MG tablet Take 1 tablet (25 mg total) by mouth daily. 90 tablet 1   Current Facility-Administered Medications  Medication Dose Route Frequency Provider Last Rate Last Dose  . 0.9 %  sodium chloride infusion  500 mL Intravenous Once Milus Banister, MD      . 0.9 %  sodium chloride infusion  500 mL Intravenous Once Milus Banister, MD         Allergies as of 04/28/2018 - Review Complete 04/28/2018  Allergen Reaction Noted  . Hydrocodone Hives and Itching   . Hydrocodone-acetaminophen Hives 07/23/2017  . Morphine and related Other (See Comments) 11/09/2011  . Rosuvastatin Other (See Comments) 01/19/2018  . Lisinopril Other (See Comments) and Cough 07/25/2015  . Nsaids Other (See Comments) 11/09/2011    Family History  Problem Relation Age of Onset  . Heart disease Father   . Diabetes Father   . Pancreatic cancer Mother   . Diabetes Mother   . Hypertension Sister        x 3  . Diabetes Sister        x 4  . Diabetes Maternal Grandmother   . Diabetes Paternal Grandmother   . Diabetes Sister   . Diabetes Sister   . Diabetes Sister   . Kidney disease Sister   . Colon cancer Neg Hx   . Colon polyps Neg Hx   . Rectal cancer Neg Hx   . Stomach cancer Neg Hx   . Esophageal cancer Neg Hx   . Breast cancer Neg Hx     Social History   Socioeconomic History  . Marital status: Single    Spouse name: Not on file  . Number of children: 0  . Years of education: Not on file  . Highest education level: Not on file  Occupational History  . Occupation: RETIRED  Social Needs  . Financial resource strain: Not on file  . Food insecurity:    Worry: Not on file    Inability: Not on file  . Transportation needs:    Medical: Not on file    Non-medical: Not on file  Tobacco Use  . Smoking status: Former Smoker    Packs/day: 0.25    Years: 5.00    Pack years: 1.25    Types: Cigarettes    Last attempt to quit: 07/22/1999    Years since quitting: 18.7  . Smokeless tobacco: Never Used  Substance and Sexual Activity  . Alcohol use: No    Alcohol/week: 0.0 oz  . Drug use: No  .  Sexual activity: Not Currently    Birth control/protection: Other-see comments    Comment: hysterectomy  Lifestyle  . Physical activity:    Days per week: Not on file    Minutes per session: Not on file  . Stress: Not on file   Relationships  . Social connections:    Talks on phone: Not on file    Gets together: Not on file    Attends religious service: Not on file    Active member of club or organization: Not on file    Attends meetings of clubs or organizations: Not on file    Relationship status: Not on file  . Intimate partner violence:    Fear of current or ex partner: Not on file    Emotionally abused: Not on file    Physically abused: Not on file    Forced sexual activity: Not on file  Other Topics Concern  . Not on file  Social History Narrative  . Not on file     Physical Exam: BP 110/64   Pulse 70   Ht 5' 7.5" (1.715 m)   Wt 250 lb (113.4 kg)   BMI 38.58 kg/m  Constitutional: generally well-appearing Psychiatric: alert and oriented x3 Abdomen: soft, nontender, nondistended, no obvious ascites, no peritoneal signs, normal bowel sounds No peripheral edema noted in lower extremities  Assessment and plan: 62 y.o. female with sigmoid diverticulitis  I reviewed her CT scan images and she does clearly have some inflammation in the sigmoid colon.  It is pretty close to her left iliopsoas and so possibly that is why she is feeling most of the pains in the back.  Her last colonoscopy was a little over 7 years ago and I recommended we repeat that now to make sure we are not missing something else such as a neoplasm.  She takes Eliquis for atrial fibrillation and we will communicate with her cardiologist about the safety of her holding that medicine for 1 day prior to the colonoscopy.  I see no reason for any further blood tests or imaging studies prior to then.  Please see the "Patient Instructions" section for addition details about the plan.  Owens Loffler, MD [facility] 04/28/2018, 8:35 AM

## 2018-04-28 NOTE — Telephone Encounter (Signed)
LM for pt to call

## 2018-04-29 NOTE — Telephone Encounter (Signed)
Patient returned call and voiced understanding on Eliquis hold for 7-15 -19 colonoscopy.

## 2018-04-29 NOTE — Telephone Encounter (Signed)
Left detailed message on voicemail regarding 24 Eliquis hold for 7-15 colonoscopy. Asked patient to call office to make sure she understands instructions.

## 2018-04-30 NOTE — Telephone Encounter (Signed)
   Primary Cardiologist: Ena Dawley, MD  Chart reviewed as part of pre-operative protocol coverage. Patient was contacted 04/30/2018 in reference to pre-operative risk assessment for pending surgery as outlined below.  DARSHA ZUMSTEIN was last seen on 01/02/18 by Lyda Jester, PA.  Since that day, PORTER NAKAMA has done well with no new symptoms.  Therefore, based on ACC/AHA guidelines, the patient would be at acceptable risk for the planned procedure without further cardiovascular testing.   According to our pharmacy protocol: Pt takes Eliquis for afib with CHADS2VASc score of 5 (sex, HTN, CAD, CHF, DM). Renal function is normal. Recommend holding Eliquis for 24 hours prior to procedure.  I will route this recommendation to the requesting party via Epic fax function and remove from pre-op pool.  Please call with questions.  Daune Perch, NP 04/30/2018, 1:40 PM

## 2018-05-04 ENCOUNTER — Telehealth: Payer: Self-pay | Admitting: Cardiology

## 2018-05-04 ENCOUNTER — Encounter: Payer: Self-pay | Admitting: Gastroenterology

## 2018-05-04 ENCOUNTER — Ambulatory Visit: Payer: Medicare Other | Admitting: Gastroenterology

## 2018-05-04 VITALS — BP 145/68 | HR 84 | Temp 98.4°F | Ht 67.5 in | Wt 250.0 lb

## 2018-05-04 MED ORDER — SODIUM CHLORIDE 0.9 % IV SOLN: 500.0000 mL | Freq: Once | INTRAVENOUS | Status: DC

## 2018-05-04 NOTE — Telephone Encounter (Signed)
New Message   Dr. Ardis Hughs is requesting a call back regarding the mutual pt. Please call

## 2018-05-04 NOTE — Progress Notes (Signed)
Pt's states no medical or surgical changes since previsit or office visit. 

## 2018-05-04 NOTE — Progress Notes (Signed)
She had chest pain, a 'thumping pain" last week, while lifting something heavy. Took her breath away. With a few minutes of deep breathing the pain went away.  12 lead ECG today looks unchanged to be vs. A few months ago.  Colonoscopy cancelled, I will discuss this with her cardiologist about an expedited evaluation in their office.

## 2018-05-06 ENCOUNTER — Other Ambulatory Visit: Payer: Self-pay

## 2018-05-06 DIAGNOSIS — K5733 Diverticulitis of large intestine without perforation or abscess with bleeding: Secondary | ICD-10-CM

## 2018-05-06 MED ORDER — PEG 3350-KCL-NA BICARB-NACL 420 G PO SOLR: 4000.0000 mL | Freq: Once | ORAL | 0 refills | Status: AC

## 2018-05-06 NOTE — Telephone Encounter (Signed)
Patty, Can you let her know that I spoke with Dr. [name] about her intermittent chest pains.  These seem to be chronic.  We agree that it is probably safe to go ahead with the colonoscopy but best to be done at the hospital rather than our outpatient facility.  Can you please reschedule a colonoscopy at [facility] next available Thursday MAC.  She will have to again hold her Eliquis for 24 hours prior to the procedure.  I do not think we need to recheck with cardiology since they just okayed that hold last week.

## 2018-05-06 NOTE — Telephone Encounter (Signed)
Colon scheduled, pt instructed and medications reviewed.  Patient instructions mailed to home.  Patient to call with any questions or concerns.  

## 2018-05-27 NOTE — Anesthesia Preprocedure Evaluation (Addendum)
Anesthesia Evaluation  Patient identified by MRN, date of birth, ID band Patient awake    Reviewed: Allergy & Precautions, NPO status , Patient's Chart, lab work & pertinent test results, reviewed documented beta blocker date and time   History of Anesthesia Complications Negative for: history of anesthetic complications  Airway Mallampati: II  TM Distance: >3 FB Neck ROM: Full    Dental  (+) Partial Upper, Dental Advisory Given Lower teeth intact:   Pulmonary former smoker,  Lung CA now with pulmonary fibrosis secondary to chemo/radiation   breath sounds clear to auscultation       Cardiovascular hypertension, + CAD (s/p stent 2016) and +CHF  + dysrhythmias (afib on eliquis, last dose on 05/26/18)  Rhythm:Regular Rate:Normal  TTE 11/2017 EF 55-60%, mild AI EKG 11/2017 NSR, no change from previous studies  L[facility] 2018  1. Double vessel CAD, stable.  2. Patent stent in the proximal to mid LAD with no restenosis 3. Patent stent in the mid Circumflex with no restenosis 4. Normal LV systolic function   Neuro/Psych negative neurological ROS  negative psych ROS   GI/Hepatic Neg liver ROS, GERD  Controlled,diverticulitis   Endo/Other  diabetes, Well Controlled, Type 2, Insulin Dependent  Renal/GU Renal stones     Musculoskeletal negative musculoskeletal ROS (+)   Abdominal   Peds  Hematology negative hematology ROS (+)   Anesthesia Other Findings Day of surgery medications reviewed with the patient.  Reproductive/Obstetrics                           Anesthesia Physical Anesthesia Plan  ASA: III  Anesthesia Plan: MAC   Post-op Pain Management:    Induction: Intravenous  PONV Risk Score and Plan: 2 and Treatment may vary due to age or medical condition and Propofol infusion  Airway Management Planned: Simple Face Mask  Additional Equipment:   Intra-op Plan:   Post-operative Plan:    Informed Consent: I have reviewed the patients History and Physical, chart, labs and discussed the procedure including the risks, benefits and alternatives for the proposed anesthesia with the patient or authorized representative who has indicated his/her understanding and acceptance.   Dental advisory given  Plan Discussed with: CRNA  Anesthesia Plan Comments:       Anesthesia Quick Evaluation

## 2018-05-28 ENCOUNTER — Ambulatory Visit (HOSPITAL_COMMUNITY): Payer: Medicare Other | Admitting: Anesthesiology

## 2018-05-28 ENCOUNTER — Encounter (HOSPITAL_COMMUNITY)
Admission: RE | Disposition: A | Discharge status: Home / Self Care | Payer: Self-pay | Source: Ambulatory Visit | Attending: Gastroenterology

## 2018-05-28 ENCOUNTER — Other Ambulatory Visit: Payer: Self-pay

## 2018-05-28 ENCOUNTER — Encounter (HOSPITAL_COMMUNITY): Payer: Self-pay | Admitting: *Deleted

## 2018-05-28 ENCOUNTER — Ambulatory Visit (HOSPITAL_COMMUNITY)
Admission: RE | Admit: 2018-05-28 | Discharge: 2018-05-28 | Disposition: A | Discharge status: Home / Self Care | Payer: Medicare Other | Source: Ambulatory Visit | Attending: Gastroenterology | Admitting: Gastroenterology

## 2018-05-28 ENCOUNTER — Telehealth: Payer: Self-pay

## 2018-05-28 DIAGNOSIS — Z87891 Personal history of nicotine dependence: Secondary | ICD-10-CM | POA: Insufficient documentation

## 2018-05-28 DIAGNOSIS — R1084 Generalized abdominal pain: Secondary | ICD-10-CM | POA: Diagnosis not present

## 2018-05-28 DIAGNOSIS — Z85118 Personal history of other malignant neoplasm of bronchus and lung: Secondary | ICD-10-CM | POA: Diagnosis not present

## 2018-05-28 DIAGNOSIS — Z888 Allergy status to other drugs, medicaments and biological substances status: Secondary | ICD-10-CM | POA: Diagnosis not present

## 2018-05-28 DIAGNOSIS — Z8249 Family history of ischemic heart disease and other diseases of the circulatory system: Secondary | ICD-10-CM | POA: Diagnosis not present

## 2018-05-28 DIAGNOSIS — J8410 Pulmonary fibrosis, unspecified: Secondary | ICD-10-CM | POA: Diagnosis not present

## 2018-05-28 DIAGNOSIS — Z6837 Body mass index (BMI) 37.0-37.9, adult: Secondary | ICD-10-CM | POA: Insufficient documentation

## 2018-05-28 DIAGNOSIS — Z886 Allergy status to analgesic agent status: Secondary | ICD-10-CM | POA: Diagnosis not present

## 2018-05-28 DIAGNOSIS — I351 Nonrheumatic aortic (valve) insufficiency: Secondary | ICD-10-CM | POA: Insufficient documentation

## 2018-05-28 DIAGNOSIS — Z885 Allergy status to narcotic agent status: Secondary | ICD-10-CM | POA: Diagnosis not present

## 2018-05-28 DIAGNOSIS — Z923 Personal history of irradiation: Secondary | ICD-10-CM | POA: Insufficient documentation

## 2018-05-28 DIAGNOSIS — I4891 Unspecified atrial fibrillation: Secondary | ICD-10-CM | POA: Diagnosis not present

## 2018-05-28 DIAGNOSIS — G8929 Other chronic pain: Secondary | ICD-10-CM

## 2018-05-28 DIAGNOSIS — M170 Bilateral primary osteoarthritis of knee: Secondary | ICD-10-CM | POA: Insufficient documentation

## 2018-05-28 DIAGNOSIS — Z800 Family history of malignant neoplasm of digestive organs: Secondary | ICD-10-CM | POA: Diagnosis not present

## 2018-05-28 DIAGNOSIS — E119 Type 2 diabetes mellitus without complications: Secondary | ICD-10-CM | POA: Diagnosis not present

## 2018-05-28 DIAGNOSIS — K5733 Diverticulitis of large intestine without perforation or abscess with bleeding: Secondary | ICD-10-CM

## 2018-05-28 DIAGNOSIS — Z794 Long term (current) use of insulin: Secondary | ICD-10-CM | POA: Insufficient documentation

## 2018-05-28 DIAGNOSIS — R109 Unspecified abdominal pain: Secondary | ICD-10-CM

## 2018-05-28 DIAGNOSIS — Z7901 Long term (current) use of anticoagulants: Secondary | ICD-10-CM | POA: Diagnosis not present

## 2018-05-28 DIAGNOSIS — I509 Heart failure, unspecified: Secondary | ICD-10-CM | POA: Insufficient documentation

## 2018-05-28 DIAGNOSIS — I2510 Atherosclerotic heart disease of native coronary artery without angina pectoris: Secondary | ICD-10-CM | POA: Insufficient documentation

## 2018-05-28 DIAGNOSIS — Z8719 Personal history of other diseases of the digestive system: Secondary | ICD-10-CM | POA: Insufficient documentation

## 2018-05-28 DIAGNOSIS — Z9221 Personal history of antineoplastic chemotherapy: Secondary | ICD-10-CM | POA: Diagnosis not present

## 2018-05-28 DIAGNOSIS — M549 Dorsalgia, unspecified: Secondary | ICD-10-CM

## 2018-05-28 DIAGNOSIS — R011 Cardiac murmur, unspecified: Secondary | ICD-10-CM | POA: Insufficient documentation

## 2018-05-28 DIAGNOSIS — I110 Hypertensive heart disease with heart failure: Secondary | ICD-10-CM | POA: Insufficient documentation

## 2018-05-28 DIAGNOSIS — E669 Obesity, unspecified: Secondary | ICD-10-CM | POA: Diagnosis not present

## 2018-05-28 DIAGNOSIS — K5730 Diverticulosis of large intestine without perforation or abscess without bleeding: Secondary | ICD-10-CM | POA: Diagnosis not present

## 2018-05-28 HISTORY — PX: COLONOSCOPY WITH PROPOFOL: SHX5780

## 2018-05-28 LAB — GLUCOSE, CAPILLARY: Glucose-Capillary: 255 mg/dL — ABNORMAL HIGH (ref 70–99)

## 2018-05-28 SURGERY — COLONOSCOPY WITH PROPOFOL
Anesthesia: Monitor Anesthesia Care

## 2018-05-28 MED ORDER — PROPOFOL 10 MG/ML IV BOLUS
INTRAVENOUS | Status: AC
  Filled 2018-05-28: qty 40

## 2018-05-28 MED ORDER — SODIUM CHLORIDE 0.9 % IV SOLN: INTRAVENOUS | Status: DC

## 2018-05-28 MED ORDER — PROPOFOL 500 MG/50ML IV EMUL
INTRAVENOUS | Status: DC | PRN
  Administered 2018-05-28: 50 mg via INTRAVENOUS

## 2018-05-28 MED ORDER — LACTATED RINGERS IV SOLN
INTRAVENOUS | Status: DC
  Administered 2018-05-28: 1000 mL via INTRAVENOUS

## 2018-05-28 MED ORDER — PROPOFOL 500 MG/50ML IV EMUL
INTRAVENOUS | Status: DC | PRN
  Administered 2018-05-28: 125 ug/kg/min via INTRAVENOUS

## 2018-05-28 SURGICAL SUPPLY — 22 items

## 2018-05-28 NOTE — Telephone Encounter (Signed)
-----   Message from Milus Banister, MD sent at 05/28/2018 11:40 AM EDT ----- She needs CT scan abd/pelvis for generalized abd pain and back pain.  Thanks

## 2018-05-28 NOTE — Discharge Instructions (Signed)
YOU HAD AN ENDOSCOPIC PROCEDURE TODAY: Refer to the procedure report and other information in the discharge instructions given to you for any specific questions about what was found during the examination. If this information does not answer your questions, please call [place] office at [phone] to clarify.  ° °YOU SHOULD EXPECT: Some feelings of bloating in the abdomen. Passage of more gas than usual. Walking can help get rid of the air that was put into your GI tract during the procedure and reduce the bloating. If you had a lower endoscopy (such as a colonoscopy or flexible sigmoidoscopy) you may notice spotting of blood in your stool or on the toilet paper. Some abdominal soreness may be present for a day or two, also. ° °DIET: Your first meal following the procedure should be a light meal and then it is ok to progress to your normal diet. A half-sandwich or bowl of soup is an example of a good first meal. Heavy or fried foods are harder to digest and may make you feel nauseous or bloated. Drink plenty of fluids but you should avoid alcoholic beverages for 24 hours. If you had a esophageal dilation, please see attached instructions for diet.   ° °ACTIVITY: Your care partner should take you home directly after the procedure. You should plan to take it easy, moving slowly for the rest of the day. You can resume normal activity the day after the procedure however YOU SHOULD NOT DRIVE, use power tools, machinery or perform tasks that involve climbing or major physical exertion for 24 hours (because of the sedation medicines used during the test).  ° °SYMPTOMS TO REPORT IMMEDIATELY: °A gastroenterologist can be reached at any hour. Please call [phone]  for any of the following symptoms:  °Following lower endoscopy (colonoscopy, flexible sigmoidoscopy) °Excessive amounts of blood in the stool  °Significant tenderness, worsening of abdominal pains  °Swelling of the abdomen that is new, acute  °Fever of 100° or  higher  °Following upper endoscopy (EGD, EUS, ERCP, esophageal dilation) °Vomiting of blood or coffee ground material  °New, significant abdominal pain  °New, significant chest pain or pain under the shoulder blades  °Painful or persistently difficult swallowing  °New shortness of breath  °Black, tarry-looking or red, bloody stools ° °FOLLOW UP:  °If any biopsies were taken you will be contacted by phone or by letter within the next 1-3 weeks. Call [phone]  if you have not heard about the biopsies in 3 weeks.  °Please also call with any specific questions about appointments or follow up tests. ° °

## 2018-05-28 NOTE — Anesthesia Postprocedure Evaluation (Signed)
Anesthesia Post Note  Patient: [name]  Procedure(s) Performed: COLONOSCOPY WITH PROPOFOL (N/A )     Patient location during evaluation: Endoscopy Anesthesia Type: MAC Level of consciousness: awake and alert Pain management: pain level controlled Vital Signs Assessment: post-procedure vital signs reviewed and stable Respiratory status: spontaneous breathing, nonlabored ventilation, respiratory function stable and patient connected to nasal cannula oxygen Cardiovascular status: blood pressure returned to baseline and stable Postop Assessment: no apparent nausea or vomiting Anesthetic complications: no    Last Vitals:  Vitals:   05/28/18 1140 05/28/18 1150  BP: 134/72 (!) 158/72  Pulse: 83 84  Resp: (!) 21 17  Temp: 36.5 C   SpO2: 100% 99%    Last Pain:  Vitals:   05/28/18 1150  TempSrc:   PainSc: 0-No pain                 Chelsey L Woodrum

## 2018-05-28 NOTE — Telephone Encounter (Signed)
The pt also needs to have labs prior to the appt.  Lab order is in Epic Left message on machine to call back

## 2018-05-28 NOTE — Op Note (Signed)
[facility] Patient Name: [name] Procedure Date: 05/28/2018 MRN: [id number] Attending MD: Milus Banister , MD Date of Birth: Apr 30, 1956 CSN: [id number] Age: 62 Admit Type: Outpatient Procedure:                Colonoscopy Indications:              Generalized abdominal pain; recent 'sigmoid                            diverticulitis' by CT, last colonoscopy 7 years ago Providers:                Milus Banister, MD, Angus Seller, William Dalton, Technician Referring MD:              Medicines:                Monitored Anesthesia Care Complications:            No immediate complications. Estimated blood loss:                            None. Estimated Blood Loss:     Estimated blood loss: none. Procedure:                Pre-Anesthesia Assessment:                           - Prior to the procedure, a History and Physical                            was performed, and patient medications and                            allergies were reviewed. The patient's tolerance of                            previous anesthesia was also reviewed. The risks                            and benefits of the procedure and the sedation                            options and risks were discussed with the patient.                            All questions were answered, and informed consent                            was obtained. Prior Anticoagulants: The patient has                            taken Eliquis (apixaban), last dose was 2 days                            prior  to procedure. ASA Grade Assessment: III - A                            patient with severe systemic disease. After                            reviewing the risks and benefits, the patient was                            deemed in satisfactory condition to undergo the                            procedure.                           After obtaining informed consent, the colonoscope               was passed under direct vision. Throughout the                            procedure, the patient's blood pressure, pulse, and                            oxygen saturations were monitored continuously. The                            CF-HQ190L ([id number] Olympus adult colonoscope was                            introduced through the anus and advanced to the the                            cecum, identified by appendiceal orifice and                            ileocecal valve. The colonoscopy was performed                            without difficulty. The patient tolerated the                            procedure well. The quality of the bowel                            preparation was good. The ileocecal valve,                            appendiceal orifice, and rectum were photographed. Scope In: 11:25:57 AM Scope Out: 11:32:17 AM Total Procedure Duration: 0 hours 6 minutes 20 seconds  Findings:      Multiple small and large-mouthed diverticula were found in the entire       colon. No areas of 'chronic' appearing diverticular changes.      The exam was otherwise without abnormality on direct and retroflexion       views. Impression:               -  Diverticulosis in the entire examined colon.                           - The examination was otherwise normal on direct                            and retroflexion views.                           - No polyps or cancers. Moderate Sedation:      N/A- Per Anesthesia Care Recommendation:           - Patient has a contact number available for                            emergencies. The signs and symptoms of potential                            delayed complications were discussed with the                            patient. Return to normal activities tomorrow.                            Written discharge instructions were provided to the                            patient.                           - Resume previous diet.                            - Continue present medications.                           - Repeat colonoscopy in 10 years for screening.                           - Given persistent pains, will repeat abd/pelvic CT                            scan. My office will arrange. Procedure Code(s):        --- Professional ---                           [phone], Colonoscopy, flexible; diagnostic, including                            collection of specimen(s) by brushing or washing,                            when performed (separate procedure) Diagnosis Code(s):        --- Professional ---  R10.84, Generalized abdominal pain                           K57.30, Diverticulosis of large intestine without                            perforation or abscess without bleeding CPT copyright 2017 American Medical Association. All rights reserved. The codes documented in this report are preliminary and upon coder review may  be revised to meet current compliance requirements. Milus Banister, MD 05/28/2018 11:38:00 AM This report has been signed electronically. Number of Addenda: 0

## 2018-05-28 NOTE — Anesthesia Procedure Notes (Signed)
Date/Time: 05/28/2018 11:20 AM Performed by: Glory Buff, CRNA Oxygen Delivery Method: Simple face mask

## 2018-05-28 NOTE — Interval H&P Note (Signed)
History and Physical Interval Note:  05/28/2018 11:12 AM  [name]  has presented today for surgery, with the diagnosis of diverticulitis  The various methods of treatment have been discussed with the patient and family. After consideration of risks, benefits and other options for treatment, the patient has consented to  Procedure(s): COLONOSCOPY WITH PROPOFOL (N/A) as a surgical intervention .  The patient's history has been reviewed, patient examined, no change in status, stable for surgery.  I have reviewed the patient's chart and labs.  Questions were answered to the patient's satisfaction.     Milus Banister

## 2018-05-28 NOTE — Telephone Encounter (Signed)
Left message on machine to call back    You have been scheduled for a CT scan of the abdomen and pelvis at [facility] ([address]---this is in the same building as Press [facility]).   You are scheduled on 06/09/18 at 230 pm. You should arrive 15 minutes prior to your appointment time for registration. Please follow the written instructions below on the day of your exam:  WARNING: IF YOU ARE ALLERGIC TO IODINE/X-RAY DYE, PLEASE NOTIFY RADIOLOGY IMMEDIATELY AT [id number]! YOU WILL BE GIVEN A 13 HOUR PREMEDICATION PREP.  1) Do not eat or drink anything after 1030 am (4 hours prior to your test) 2) You have been given 2 bottles of oral contrast to drink. The solution may taste better if refrigerated, but do NOT add ice or any other liquid to this solution. Shake well before drinking.   Drink 1 bottle of contrast @ 1230 pm (2 hours prior to your exam) Drink 1 bottle of contrast @ 130 pm (1 hour prior to your exam)  You may take any medications as prescribed with a small amount of water except for the following: Metformin, Glucophage, Glucovance, Avandamet, Riomet, Fortamet, Actoplus Met, Janumet, Glumetza or Metaglip. The above medications must be held the day of the exam AND 48 hours after the exam.  The purpose of you drinking the oral contrast is to aid in the visualization of your intestinal tract. The contrast solution may cause some diarrhea. Before your exam is started, you will be given a small amount of fluid to drink. Depending on your individual set of symptoms, you may also receive an intravenous injection of x-ray contrast/dye. Plan on being at [facility] for 30 minutes or longer, depending on the type of exam you are having performed.  This test typically takes 30-45 minutes to complete.  If you have any questions regarding your exam or if you need to reschedule, you may call the CT department at [phone] between the hours of 8:00 am and 5:00 pm,  Monday-Friday.  ________________________________________________________________________

## 2018-05-28 NOTE — Telephone Encounter (Signed)
The pt has been advised of the information and verbalized understanding.    She will also have labs when she picks up contrast that has been left at the front desk.

## 2018-05-28 NOTE — Transfer of Care (Signed)
Immediate Anesthesia Transfer of Care Note  Patient: [name]  Procedure(s) Performed: COLONOSCOPY WITH PROPOFOL (N/A )  Patient Location: PACU  Anesthesia Type:MAC  Level of Consciousness: awake, alert  and oriented  Airway & Oxygen Therapy: Patient Spontanous Breathing and Patient connected to face mask oxygen  Post-op Assessment: Report given to RN and Post -op Vital signs reviewed and stable  Post vital signs: Reviewed and stable  Last Vitals:  Vitals Value Taken Time  BP    Temp    Pulse 83 05/28/2018 11:40 AM  Resp 21 05/28/2018 11:40 AM  SpO2 100 % 05/28/2018 11:40 AM  Vitals shown include unvalidated device data.  Last Pain:  Vitals:   05/28/18 0917  TempSrc: Oral  PainSc: 0-No pain         Complications: No apparent anesthesia complications

## 2018-05-29 ENCOUNTER — Other Ambulatory Visit (INDEPENDENT_AMBULATORY_CARE_PROVIDER_SITE_OTHER): Payer: Medicare Other

## 2018-05-29 ENCOUNTER — Other Ambulatory Visit: Payer: Self-pay | Admitting: Cardiology

## 2018-05-29 ENCOUNTER — Encounter (HOSPITAL_COMMUNITY): Payer: Self-pay | Admitting: Gastroenterology

## 2018-05-29 DIAGNOSIS — G8929 Other chronic pain: Secondary | ICD-10-CM | POA: Diagnosis not present

## 2018-05-29 DIAGNOSIS — R109 Unspecified abdominal pain: Secondary | ICD-10-CM

## 2018-05-29 DIAGNOSIS — M549 Dorsalgia, unspecified: Secondary | ICD-10-CM

## 2018-05-29 LAB — BUN: BUN: 24 mg/dL — ABNORMAL HIGH (ref 6–23)

## 2018-05-29 LAB — CREATININE, SERUM: Creatinine, Ser: 1.52 mg/dL — ABNORMAL HIGH (ref 0.40–1.20)

## 2018-06-03 ENCOUNTER — Telehealth: Payer: Self-pay | Admitting: Cardiology

## 2018-06-03 NOTE — Telephone Encounter (Signed)
Pt is calling in to ask what OTC meds are safe to take for a cold, with her heart meds and history.  Informed the pt that she may safely use tylenol as needed for fever, coricidin as needed for cough, mucinex without D, and claritin for allergies.  Also advised she may use saline nasal spray as needed for nasal congestion.  Advised the pt that if she has a fever, with cough and congestion, she may need to refer to the ER for further evaluation and work-up.  Pt verbalized understanding and agrees with this plan.

## 2018-06-03 NOTE — Telephone Encounter (Signed)
New Message:  Patient has had a cough for 3 weeks , went to her PCP and was told it is caused by allergies.  Patient disagrees and feel it may be something else, pt will cough until she vomits.   Patient wants cardiologist opinion.

## 2018-06-04 ENCOUNTER — Other Ambulatory Visit: Payer: Self-pay | Admitting: Cardiology

## 2018-06-09 ENCOUNTER — Inpatient Hospital Stay: Admission: RE | Admit: 2018-06-09 | Payer: Medicare Other | Source: Ambulatory Visit

## 2018-06-11 ENCOUNTER — Ambulatory Visit (INDEPENDENT_AMBULATORY_CARE_PROVIDER_SITE_OTHER)
Admission: RE | Admit: 2018-06-11 | Discharge: 2018-06-11 | Disposition: A | Discharge status: Home / Self Care | Payer: Medicare Other | Source: Ambulatory Visit | Attending: Gastroenterology | Admitting: Gastroenterology

## 2018-06-11 DIAGNOSIS — M549 Dorsalgia, unspecified: Secondary | ICD-10-CM

## 2018-06-11 DIAGNOSIS — G8929 Other chronic pain: Secondary | ICD-10-CM

## 2018-06-11 DIAGNOSIS — R109 Unspecified abdominal pain: Secondary | ICD-10-CM

## 2018-06-11 MED ORDER — IOPAMIDOL (ISOVUE-300) INJECTION 61%
100.0000 mL | Freq: Once | INTRAVENOUS | Status: AC | PRN
  Administered 2018-06-11: 80 mL via INTRAVENOUS

## 2018-07-06 ENCOUNTER — Other Ambulatory Visit: Payer: Self-pay | Admitting: Cardiology

## 2018-07-06 DIAGNOSIS — I5033 Acute on chronic diastolic (congestive) heart failure: Secondary | ICD-10-CM

## 2018-07-06 DIAGNOSIS — I319 Disease of pericardium, unspecified: Secondary | ICD-10-CM

## 2018-07-26 ENCOUNTER — Other Ambulatory Visit: Payer: Self-pay | Admitting: Internal Medicine

## 2018-08-14 ENCOUNTER — Other Ambulatory Visit: Payer: Self-pay | Admitting: Cardiology

## 2018-08-17 ENCOUNTER — Other Ambulatory Visit: Payer: Self-pay | Admitting: Internal Medicine

## 2018-08-22 ENCOUNTER — Other Ambulatory Visit: Payer: Self-pay | Admitting: Cardiology

## 2018-08-24 ENCOUNTER — Telehealth: Payer: Self-pay

## 2018-08-25 NOTE — Telephone Encounter (Signed)
Pharmacy is requesting 40 mg qd, but want to clarify if it should still be 40 mg bid. Please advise. Thanks, MI

## 2018-08-26 ENCOUNTER — Encounter: Payer: Self-pay | Admitting: Internal Medicine

## 2018-08-26 ENCOUNTER — Ambulatory Visit (INDEPENDENT_AMBULATORY_CARE_PROVIDER_SITE_OTHER): Payer: Medicare Other | Admitting: Internal Medicine

## 2018-08-26 ENCOUNTER — Ambulatory Visit: Payer: Medicare Other | Admitting: Internal Medicine

## 2018-08-26 ENCOUNTER — Ambulatory Visit: Payer: Medicare Other

## 2018-08-26 VITALS — BP 126/88 | HR 64 | Temp 98.6°F | Ht 67.0 in | Wt 252.2 lb

## 2018-08-26 VITALS — BP 152/78 | HR 64 | Temp 98.6°F | Ht 67.0 in | Wt 252.2 lb

## 2018-08-26 DIAGNOSIS — I4891 Unspecified atrial fibrillation: Secondary | ICD-10-CM | POA: Diagnosis not present

## 2018-08-26 DIAGNOSIS — M545 Low back pain: Secondary | ICD-10-CM

## 2018-08-26 DIAGNOSIS — E1165 Type 2 diabetes mellitus with hyperglycemia: Secondary | ICD-10-CM | POA: Diagnosis not present

## 2018-08-26 DIAGNOSIS — N3000 Acute cystitis without hematuria: Secondary | ICD-10-CM

## 2018-08-26 DIAGNOSIS — G8929 Other chronic pain: Secondary | ICD-10-CM

## 2018-08-26 DIAGNOSIS — I119 Hypertensive heart disease without heart failure: Secondary | ICD-10-CM

## 2018-08-26 DIAGNOSIS — Z794 Long term (current) use of insulin: Secondary | ICD-10-CM

## 2018-08-26 DIAGNOSIS — Z0000 Encounter for general adult medical examination without abnormal findings: Secondary | ICD-10-CM | POA: Diagnosis not present

## 2018-08-26 LAB — POCT URINALYSIS DIPSTICK
Bilirubin, UA: NEGATIVE
Glucose, UA: NEGATIVE
Ketones, UA: NEGATIVE
Leukocytes, UA: NEGATIVE
Nitrite, UA: POSITIVE
Protein, UA: NEGATIVE
Spec Grav, UA: 1.025 (ref 1.010–1.025)
Urobilinogen, UA: 1 E.U./dL
pH, UA: 5.5 (ref 5.0–8.0)

## 2018-08-26 LAB — POCT UA - MICROALBUMIN
Albumin/Creatinine Ratio, Urine, POC: <30
Creatinine, POC: 300 mg/dL
Microalbumin Ur, POC: 30 mg/L

## 2018-08-26 MED ORDER — CYCLOBENZAPRINE HCL 10 MG PO TABS: 10.0000 mg | ORAL_TABLET | Freq: Every evening | ORAL | 0 refills | Status: DC | PRN

## 2018-08-26 NOTE — Patient Instructions (Signed)
[name] , Thank you for taking time to come for your Medicare Wellness Visit. I appreciate your ongoing commitment to your health goals. Please review the following plan we discussed and let me know if I can assist you in the future.   Screening recommendations/referrals: Colonoscopy: 05/2018  Mammogram: 09/2017 Bone Density: 02/2018 Recommended yearly ophthalmology/optometry visit for glaucoma screening and checkup Recommended yearly dental visit for hygiene and checkup  Vaccinations: Influenza vaccine: decline Pneumococcal vaccine: 03/2015 Tdap vaccine: decline Shingles vaccine: decline    Advanced directives: Advance directive discussed with you today. Even though you declined this today please call our office should you change your mind and we can give you the proper paperwork for you to fill out.   Conditions/risks identified: Obesity: patient states she wants to lose 20 pounds and is working on increasing water intake.  Next appointment: 08/26/2018  Preventive Care 40-64 Years, Female Preventive care refers to lifestyle choices and visits with your health care provider that can promote health and wellness. What does preventive care include?  A yearly physical exam. This is also called an annual well check.  Dental exams once or twice a year.  Routine eye exams. Ask your health care provider how often you should have your eyes checked.  Personal lifestyle choices, including:  Daily care of your teeth and gums.  Regular physical activity.  Eating a healthy diet.  Avoiding tobacco and drug use.  Limiting alcohol use.  Practicing safe sex.  Taking low-dose aspirin daily starting at age 26.  Taking vitamin and mineral supplements as recommended by your health care provider. What happens during an annual well check? The services and screenings done by your health care provider during your annual well check will depend on your age, overall health, lifestyle risk  factors, and family history of disease. Counseling  Your health care provider may ask you questions about your:  Alcohol use.  Tobacco use.  Drug use.  Emotional well-being.  Home and relationship well-being.  Sexual activity.  Eating habits.  Work and work Statistician.  Method of birth control.  Menstrual cycle.  Pregnancy history. Screening  You may have the following tests or measurements:  Height, weight, and BMI.  Blood pressure.  Lipid and cholesterol levels. These may be checked every 5 years, or more frequently if you are over 62 years old.  Skin check.  Lung cancer screening. You may have this screening every year starting at age 87 if you have a 30-pack-year history of smoking and currently smoke or have quit within the past 15 years.  Fecal occult blood test (FOBT) of the stool. You may have this test every year starting at age 62.  Flexible sigmoidoscopy or colonoscopy. You may have a sigmoidoscopy every 5 years or a colonoscopy every 10 years starting at age 62.  Hepatitis C blood test.  Hepatitis B blood test.  Sexually transmitted disease (STD) testing.  Diabetes screening. This is done by checking your blood sugar (glucose) after you have not eaten for a while (fasting). You may have this done every 1-3 years.  Mammogram. This may be done every 1-2 years. Talk to your health care provider about when you should start having regular mammograms. This may depend on whether you have a family history of breast cancer.  BRCA-related cancer screening. This may be done if you have a family history of breast, ovarian, tubal, or peritoneal cancers.  Pelvic exam and Pap test. This may be done every 3 years starting  at age 90. Starting at age 85, this may be done every 5 years if you have a Pap test in combination with an HPV test.  Bone density scan. This is done to screen for osteoporosis. You may have this scan if you are at high risk for  osteoporosis. Discuss your test results, treatment options, and if necessary, the need for more tests with your health care provider. Vaccines  Your health care provider may recommend certain vaccines, such as:  Influenza vaccine. This is recommended every year.  Tetanus, diphtheria, and acellular pertussis (Tdap, Td) vaccine. You may need a Td booster every 10 years.  Zoster vaccine. You may need this after age 25.  Pneumococcal 13-valent conjugate (PCV13) vaccine. You may need this if you have certain conditions and were not previously vaccinated.  Pneumococcal polysaccharide (PPSV23) vaccine. You may need one or two doses if you smoke cigarettes or if you have certain conditions. Talk to your health care provider about which screenings and vaccines you need and how often you need them. This information is not intended to replace advice given to you by your health care provider. Make sure you discuss any questions you have with your health care provider. Document Released: 11/03/2015 Document Revised: 06/26/2016 Document Reviewed: 08/08/2015 Elsevier Interactive Patient Education  2017 [employer] Prevention in the Home Falls can cause injuries. They can happen to people of all ages. There are many things you can do to make your home safe and to help prevent falls. What can I do on the outside of my home?  Regularly fix the edges of walkways and driveways and fix any cracks.  Remove anything that might make you trip as you walk through a door, such as a raised step or threshold.  Trim any bushes or trees on the path to your home.  Use bright outdoor lighting.  Clear any walking paths of anything that might make someone trip, such as rocks or tools.  Regularly check to see if handrails are loose or broken. Make sure that both sides of any steps have handrails.  Any raised decks and porches should have guardrails on the edges.  Have any leaves, snow, or ice cleared  regularly.  Use sand or salt on walking paths during winter.  Clean up any spills in your garage right away. This includes oil or grease spills. What can I do in the bathroom?  Use night lights.  Install grab bars by the toilet and in the tub and shower. Do not use towel bars as grab bars.  Use non-skid mats or decals in the tub or shower.  If you need to sit down in the shower, use a plastic, non-slip stool.  Keep the floor dry. Clean up any water that spills on the floor as soon as it happens.  Remove soap buildup in the tub or shower regularly.  Attach bath mats securely with double-sided non-slip rug tape.  Do not have throw rugs and other things on the floor that can make you trip. What can I do in the bedroom?  Use night lights.  Make sure that you have a light by your bed that is easy to reach.  Do not use any sheets or blankets that are too big for your bed. They should not hang down onto the floor.  Have a firm chair that has side arms. You can use this for support while you get dressed.  Do not have throw rugs and other  things on the floor that can make you trip. What can I do in the kitchen?  Clean up any spills right away.  Avoid walking on wet floors.  Keep items that you use a lot in easy-to-reach places.  If you need to reach something above you, use a strong step stool that has a grab bar.  Keep electrical cords out of the way.  Do not use floor polish or wax that makes floors slippery. If you must use wax, use non-skid floor wax.  Do not have throw rugs and other things on the floor that can make you trip. What can I do with my stairs?  Do not leave any items on the stairs.  Make sure that there are handrails on both sides of the stairs and use them. Fix handrails that are broken or loose. Make sure that handrails are as long as the stairways.  Check any carpeting to make sure that it is firmly attached to the stairs. Fix any carpet that is loose  or worn.  Avoid having throw rugs at the top or bottom of the stairs. If you do have throw rugs, attach them to the floor with carpet tape.  Make sure that you have a light switch at the top of the stairs and the bottom of the stairs. If you do not have them, ask someone to add them for you. What else can I do to help prevent falls?  Wear shoes that:  Do not have high heels.  Have rubber bottoms.  Are comfortable and fit you well.  Are closed at the toe. Do not wear sandals.  If you use a stepladder:  Make sure that it is fully opened. Do not climb a closed stepladder.  Make sure that both sides of the stepladder are locked into place.  Ask someone to hold it for you, if possible.  Clearly mark and make sure that you can see:  Any grab bars or handrails.  First and last steps.  Where the edge of each step is.  Use tools that help you move around (mobility aids) if they are needed. These include:  Canes.  Walkers.  Scooters.  Crutches.  Turn on the lights when you go into a dark area. Replace any light bulbs as soon as they burn out.  Set up your furniture so you have a clear path. Avoid moving your furniture around.  If any of your floors are uneven, fix them.  If there are any pets around you, be aware of where they are.  Review your medicines with your doctor. Some medicines can make you feel dizzy. This can increase your chance of falling. Ask your doctor what other things that you can do to help prevent falls. This information is not intended to replace advice given to you by your health care provider. Make sure you discuss any questions you have with your health care provider. Document Released: 08/03/2009 Document Revised: 03/14/2016 Document Reviewed: 11/11/2014 Elsevier Interactive Patient Education  2017 [employer].

## 2018-08-26 NOTE — Progress Notes (Signed)
Subjective:   [name] is a 62 y.o. female who presents for Medicare Annual (Subsequent) preventive examination.  Review of Systems:  n/a Cardiac Risk Factors include: diabetes mellitus;hypertension;dyslipidemia;obesity (BMI >30kg/m2)     Objective:     Vitals: BP (!) 152/78 (BP Location: Left Arm)   Pulse 64   Temp 98.6 F (37 C)   Ht 5\' 7"  (1.702 m)   Wt 252 lb 3.2 oz (114.4 kg)   BMI 39.50 kg/m   Body mass index is 39.5 kg/m.  Advanced Directives 08/26/2018 05/28/2018 12/01/2017 09/19/2017 06/25/2017 04/12/2017 12/18/2016  Does Patient Have a Medical Advance Directive? No No No No No No No  Would patient like information on creating a medical advance directive? No - Patient declined No - Patient declined No - Patient declined No - Patient declined No - Patient declined No - Patient declined No - Patient declined    Tobacco Social History   Tobacco Use  Smoking Status Former Smoker  . Packs/day: 0.25  . Years: 5.00  . Pack years: 1.25  . Types: Cigarettes  . Last attempt to quit: 07/22/1999  . Years since quitting: 19.1  Smokeless Tobacco Never Used     Counseling given: Not Answered   Clinical Intake:  Pre-visit preparation completed: Yes  Pain : 0-10 Pain Score: 7  Pain Type: Chronic pain Pain Location: Back Pain Orientation: Lower Pain Radiating Towards: left buttock ( Just above) Pain Descriptors / Indicators: Jabbing, Stabbing Pain Onset: More than a month ago Pain Frequency: Intermittent Pain Relieving Factors: rest helps with aleve or Apap Effect of Pain on Daily Activities: none at this time  Pain Relieving Factors: rest helps with aleve or Apap  Nutritional Status: BMI > 30  Obese Nutritional Risks: Nausea/ vomitting/ diarrhea(diarrhea last week for two days) Diabetes: Yes CBG done?: No Did pt. bring in CBG monitor from home?: No  How often do you need to have someone help you when you read instructions, pamphlets, or other written  materials from your doctor or pharmacy?: 1 - Never What is the last grade level you completed in school?: 12th grade  Interpreter Needed?: No  Information entered by :: NAllen LPN  Past Medical History:  Diagnosis Date  . Allergy   . Anemia   . Anemia   . Aortic insufficiency    a. Mod by echo 05/2015.  . Arthritis    "knees" (07/11/2015)  . Atrial fibrillation (HCC)    On Eliquis  . 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 Complication of anesthesia    "I had a hard time waking me up w/one of my shoulder surgeries"  . Coronary artery disease    a. Abnormal stress test -> LHc 06/2015 s/p DES to mCX and mLAD, residual D2 disease treated medially.  . Diverticulitis   . Elevated blood pressure   . Erythema nodosum   . Heart murmur   . Insomnia   . lung ca dx'd 10/1999   chemo/xrt comp 05/29/2000  . Myalgia   . Nephrolithiasis 2015  . Obesity   . Pericardial effusion    a. 05/2015 Echo: EF 55-60%, no rwma, Gr 1 DD, no effusion - but an effusion was seen on CT which was felt to be increased in size compared to 12/2014 - pericardium also thickened.   . Pericarditis    a. Dx 05/2015.  [name]  Pneumothorax, spontaneous, tension 01/20/2012   October, 2010  . Pulmonary fibrosis ([place]) 01/20/2012   Due to previous surgery and chest radiation for lung cancer  . Restrictive lung disease   . Type II diabetes mellitus ([place])    Past Surgical History:  Procedure Laterality Date  . ABDOMINAL HYSTERECTOMY  2008  . APPENDECTOMY  1969?  [name] CARDIAC CATHETERIZATION N/A 07/11/2015   Procedure: Left Heart Cath and Coronary Angiography;  Surgeon: Jettie Booze, MD;  Location: [facility];  Service: Cardiovascular;  Laterality: N/A;  . CARDIAC CATHETERIZATION  07/11/2015   Procedure: Coronary Stent Intervention;  Surgeon: Jettie Booze, MD;  Location: [facility];   Service: Cardiovascular;;  . CARPAL TUNNEL RELEASE Bilateral 1998-2005?   right-left  . CARPAL TUNNEL RELEASE Left 2009?   "for the 2nd time"  . COLONOSCOPY    . COLONOSCOPY WITH PROPOFOL N/A 05/28/2018   Procedure: COLONOSCOPY WITH PROPOFOL;  Surgeon: Milus Banister, MD;  Location: [facility];  Service: Endoscopy;  Laterality: N/A;  . CORONARY ANGIOPLASTY    . CYSTOSCOPY W/ STONE MANIPULATION  2013  . ESOPHAGOGASTRODUODENOSCOPY (EGD) WITH ESOPHAGEAL DILATION  2012  . FOOT SURGERY Bilateral 1980's   Callous removed   . INNER EAR SURGERY Right ~ 2009  . KNEE ARTHROPLASTY Right 1991  . KNEE ARTHROSCOPY  ~ 2000   LATERAL RELEASE  . LEFT HEART CATH AND CORONARY ANGIOGRAPHY N/A 12/18/2016   Procedure: Left Heart Cath and Coronary Angiography;  Surgeon: Burnell Blanks, MD;  Location: [facility];  Service: Cardiovascular;  Laterality: N/A;  . LUNG CANCER SURGERY Left 2001   "small cell"  . LUNG SURGERY Right 2007   "reinflated it"  . PULMONARY EMBOLISM SURGERY  2007   LUNG COLLAPSE  . RIGHT AND LEFT HEART CATH N/A 06/25/2017   Procedure: RIGHT AND LEFT HEART CATH;  Surgeon: Sherren Mocha, MD;  Location: [facility];  Service: Cardiovascular;  Laterality: N/A;  . SHOULDER ADHESION RELEASE Right ~ 2004  . SHOULDER ARTHROSCOPY W/ ROTATOR CUFF REPAIR Right ~ 2003   Family History  Problem Relation Age of Onset  . Heart disease Father   . Diabetes Father   . Pancreatic cancer Mother   . Diabetes Mother   . Hypertension Sister        x 3  . Diabetes Sister        x 4  . Diabetes Maternal Grandmother   . Diabetes Paternal Grandmother   . Diabetes Sister   . Diabetes Sister   . Diabetes Sister   . Kidney disease Sister   . Colon cancer Neg Hx   . Colon polyps Neg Hx   . Rectal cancer Neg Hx   . Stomach cancer Neg Hx   . Esophageal cancer Neg Hx   . Breast cancer Neg Hx    Social History   Socioeconomic History  . Marital status: Single    Spouse name:  Not on file  . Number of children: 0  . Years of education: Not on file  . Highest education level: Not on file  Occupational History  . Occupation: RETIRED  Social Needs  . Financial resource strain: Not hard at all  . Food insecurity:    Worry: Never true    Inability: Never true  . Transportation needs:    Medical: No    Non-medical: No  Tobacco Use  . Smoking status: Former Smoker    Packs/day: 0.25  Years: 5.00    Pack years: 1.25    Types: Cigarettes    Last attempt to quit: 07/22/1999    Years since quitting: 19.1  . Smokeless tobacco: Never Used  Substance and Sexual Activity  . Alcohol use: No    Alcohol/week: 0.0 standard drinks  . Drug use: No  . Sexual activity: Not Currently    Birth control/protection: Other-see comments    Comment: hysterectomy  Lifestyle  . Physical activity:    Days per week: 0 days    Minutes per session: 0 min  . Stress: Not at all  Relationships  . Social connections:    Talks on phone: Not on file    Gets together: Not on file    Attends religious service: Not on file    Active member of club or organization: Not on file    Attends meetings of clubs or organizations: Not on file    Relationship status: Not on file  Other Topics Concern  . Not on file  Social History Narrative  . Not on file    Outpatient Encounter Medications as of 08/26/2018  Medication Sig  . co-enzyme Q-10 50 MG capsule TAKE 1 CAPSULE (50 MG TOTAL) BY MOUTH DAILY.  [name] diltiazem (CARDIZEM CD) 240 MG 24 hr capsule TAKE 1 CAPSULE BY MOUTH EVERY DAY  . ELIQUIS 5 MG TABS tablet TAKE 1 TABLET BY MOUTH TWICE A DAY  . furosemide (LASIX) 40 MG tablet TAKE 1 TABLET BY MOUTH EVERY DAY  . HUMALOG MIX 75/25 KWIKPEN (75-25) 100 UNIT/ML Kwikpen Inject 30-40 Units into the skin See admin instructions. Inject 40 units SQ in Am and 30 units SQ in PM  . levothyroxine (SYNTHROID, LEVOTHROID) 50 MCG tablet Take 50 mcg by mouth daily before breakfast.   . losartan (COZAAR) 25  MG tablet TAKE 1 TABLET (25 MG TOTAL) BY MOUTH DAILY.  . metoprolol tartrate (LOPRESSOR) 25 MG tablet TAKE 1 TABLET (25 MG TOTAL) BY MOUTH 2 (TWO) TIMES DAILY.  . pravastatin (PRAVACHOL) 20 MG tablet TAKE 1 TABLET BY MOUTH EVERY DAY IN THE EVENING  . spironolactone (ALDACTONE) 25 MG tablet TAKE 1 TABLET BY MOUTH EVERY DAY  . Vitamin D, Ergocalciferol, (DRISDOL) 50000 units CAPS capsule TAKE 1 CAPSULE BY MOUTH ON TUESDAY AND FRIDAY  . furosemide (LASIX) 40 MG tablet Take 1 tablet (40 mg total) by mouth 2 (two) times daily.  . nitroGLYCERIN (NITROSTAT) 0.4 MG SL tablet Place 1 tablet (0.4 mg total) under the tongue every 5 (five) minutes as needed for chest pain.   No facility-administered encounter medications on file as of 08/26/2018.     Activities of Daily Living In your present state of health, do you have any difficulty performing the following activities: 08/26/2018  Hearing? Y  Comment right ear issue  Vision? N  Difficulty concentrating or making decisions? N  Walking or climbing stairs? Y  Comment SOB  Dressing or bathing? N  Doing errands, shopping? N  Preparing Food and eating ? N  Using the Toilet? N  In the past six months, have you accidently leaked urine? Y  Comment if hold too long  Do you have problems with loss of bowel control? N  Managing your Medications? N  Managing your Finances? N  Housekeeping or managing your Housekeeping? N  Some recent data might be hidden    Patient Care Team: Glendale Chard, MD as PCP - General (Internal Medicine) Dorothy Spark, MD as PCP - Cardiology (Cardiology) Harrington Challenger,  Carmin Muskrat, MD (Cardiology) Melida Quitter, MD Milus Banister, MD (Gastroenterology) Dorna Leitz, MD (Orthopedic Surgery) Annia Belt, MD (Hematology and Oncology) Nicanor Alcon, MD (Thoracic Surgery) Truddie Crumble, MD (Radiation Oncology) Clent Jacks, MD as Consulting Physician (Ophthalmology) Jacelyn Pi, MD as Consulting Physician  (Endocrinology)    Assessment:   This is a routine wellness examination for [place].  Exercise Activities and Dietary recommendations Current Exercise Habits: The patient does not participate in regular exercise at present, Exercise limited by: None identified  Goals    . DIET - INCREASE WATER INTAKE (pt-stated)    . Weight (lb) < 200 lb (90.7 kg) (pt-stated)     Wants to lose 20 pounds       Fall Risk Fall Risk  08/26/2018 11/23/2015 04/18/2015 04/10/2015  Falls in the past year? 0 No Yes No  Number falls in past yr: - - 1 -  Risk for fall due to : Medication side effect - - -   Is the patient's home free of loose throw rugs in walkways, pet beds, electrical cords, etc?   yes      Grab bars in the bathroom? yes      Handrails on the stairs?   N/a       Adequate lighting?   yes  Timed Get Up and Go performed: n/a  Depression Screen PHQ 2/9 Scores 08/26/2018 11/23/2015 10/20/2015 08/25/2015  PHQ - 2 Score 0 0 0 0     Cognitive Function     6CIT Screen 08/26/2018  What Year? 0 points  What month? 0 points  What time? 0 points  Count back from 20 0 points  Months in reverse 0 points  Repeat phrase 0 points  Total Score 0    Immunization History  Administered Date(s) Administered  . Influenza Whole 07/21/2009, 07/29/2010  . Influenza,inj,Quad PF,6+ Mos 11/23/2015  . Influenza-Unspecified 08/14/2016  . PPD Test 06/19/2013  . Pneumococcal Conjugate-13 04/18/2015  . Pneumococcal Polysaccharide-23 07/29/2010    Qualifies for Shingles Vaccine? yes  Screening Tests Health Maintenance  Topic Date Due  . TETANUS/TDAP  07/22/1977  . PAP SMEAR  10/22/2003  . FOOT EXAM  11/22/2016  . HEMOGLOBIN A1C  10/12/2017  . OPHTHALMOLOGY EXAM  06/19/2018  . INFLUENZA VACCINE  08/27/2019 (Originally 05/21/2018)  . MAMMOGRAM  10/02/2019  . COLONOSCOPY  05/28/2028  . PNEUMOCOCCAL POLYSACCHARIDE VACCINE AGE 23-64 HIGH RISK  Completed  . Hepatitis C Screening  Completed  . HIV Screening   Completed    Cancer Screenings: Lung: Low Dose CT Chest recommended if Age 15-80 years, 30 pack-year currently smoking OR have quit w/in 15years. Patient does not qualify. Breast:  Up to date on Mammogram? Yes   Up to date of Bone Density/Dexa? Yes Colorectal: up to date  Additional Screenings: : Hepatitis C Screening: due     Plan:    Patient declines any vaccinations today. Patient has a goal to lose 20 pounds and to increase water intake.   I have personally reviewed and noted the following in the patient's chart:   . Medical and social history . Use of alcohol, tobacco or illicit drugs  . Current medications and supplements . Functional ability and status . Nutritional status . Physical activity . Advanced directives . List of other physicians . Hospitalizations, surgeries, and ER visits in previous 12 months . Vitals . Screenings to include cognitive, depression, and falls . Referrals and appointments  In addition, I have reviewed and discussed  with patient certain preventive protocols, quality metrics, and best practice recommendations. A written personalized care plan for preventive services as well as general preventive health recommendations were provided to patient.     Kellie Simmering, LPN  [id number]

## 2018-08-27 ENCOUNTER — Other Ambulatory Visit: Payer: Self-pay | Admitting: Internal Medicine

## 2018-08-27 MED ORDER — NITROFURANTOIN MONOHYD MACRO 100 MG PO CAPS: 100.0000 mg | ORAL_CAPSULE | Freq: Two times a day (BID) | ORAL | 0 refills | Status: AC

## 2018-08-27 NOTE — Progress Notes (Signed)
Pt contacted - she has UTI, abx will be sent to the pharmacy

## 2018-08-29 LAB — URINE CULTURE

## 2018-08-30 NOTE — Progress Notes (Signed)
Urine culture is POS for UTI. Did you take abx as directed? Are you feeling any better?  Be sure to stay well hydrated and urinate after sexual intercourse.

## 2018-08-31 ENCOUNTER — Telehealth: Payer: Self-pay

## 2018-08-31 NOTE — Telephone Encounter (Signed)
I gave the pt her lab results and the pt said that she is taking her medication for her UTI and that she feels ok except her back hurts.  The pt was told that the UTI can cause her back to hurt.

## 2018-08-31 NOTE — Progress Notes (Addendum)
Subjective:     Patient ID: [name] , female    DOB: 1956/06/20 , 62 y.o.   MRN: [id number]   Chief Complaint  Patient presents with  . Hypertension  . Diabetes    HPI  Hypertension  This is a chronic problem. The current episode started more than 1 year ago. The problem has been gradually improving since onset. The problem is controlled. Pertinent negatives include no chest pain. Risk factors for coronary artery disease include diabetes mellitus, sedentary lifestyle, obesity and post-menopausal state. Compliance problems include exercise.   Diabetes  She presents for her follow-up diabetic visit. She has type 2 diabetes mellitus. Her disease course has been stable. There are no hypoglycemic associated symptoms. Pertinent negatives for diabetes include no chest pain, no fatigue and no weakness. There are no hypoglycemic complications. Risk factors for coronary artery disease include diabetes mellitus, hypertension, obesity and sedentary lifestyle.   She is currently followed by Dr. [name].   Past Medical History:  Diagnosis Date  . Allergy   . Anemia   . Anemia   . Aortic insufficiency    a. Mod by echo 05/2015.  . Arthritis    "knees" (07/11/2015)  . Atrial fibrillation (HCC)    On Eliquis  . 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 Complication of anesthesia    "I had a hard time waking me up w/one of my shoulder surgeries"  . Coronary artery disease    a. Abnormal stress test -> LHc 06/2015 s/p DES to mCX and mLAD, residual D2 disease treated medially.  . Diverticulitis   . Elevated blood pressure   . Erythema nodosum   . Heart murmur   . Insomnia   . lung ca dx'd 10/1999   chemo/xrt comp 05/29/2000  . Myalgia   . Nephrolithiasis 2015  . Obesity   . Pericardial effusion    a. 05/2015 Echo: EF 55-60%, no rwma, Gr 1 DD, no effusion - but an effusion was  seen on CT which was felt to be increased in size compared to 12/2014 - pericardium also thickened.   . Pericarditis    a. Dx 05/2015.  [name] Pneumothorax, spontaneous, tension 01/20/2012   October, 2010  . Pulmonary fibrosis ([place]) 01/20/2012   Due to previous surgery and chest radiation for lung cancer  . Restrictive lung disease   . Type II diabetes mellitus (HCC)      Family History  Problem Relation Age of Onset  . Heart disease Father   . Diabetes Father   . Pancreatic cancer Mother   . Diabetes Mother   . Hypertension Sister        x 3  . Diabetes Sister        x 4  . Diabetes Maternal Grandmother   . Diabetes Paternal Grandmother   . Diabetes Sister   . Diabetes Sister   . Diabetes Sister   . Kidney disease Sister   . Colon cancer Neg Hx   . Colon polyps Neg Hx   . Rectal cancer Neg Hx   . Stomach cancer Neg Hx   . Esophageal cancer Neg Hx   . Breast cancer Neg Hx      Current Outpatient Medications:  .  co-enzyme Q-10 50 MG capsule, TAKE 1 CAPSULE (50 MG TOTAL) BY MOUTH DAILY., Disp: 90 capsule, Rfl: 1 .  cyclobenzaprine (FLEXERIL) 10  MG tablet, Take 1 tablet (10 mg total) by mouth at bedtime as needed for muscle spasms (back pain)., Disp: 30 tablet, Rfl: 0 .  diltiazem (CARDIZEM CD) 240 MG 24 hr capsule, TAKE 1 CAPSULE BY MOUTH EVERY DAY, Disp: 90 capsule, Rfl: 2 .  ELIQUIS 5 MG TABS tablet, TAKE 1 TABLET BY MOUTH TWICE A DAY, Disp: 180 tablet, Rfl: 1 .  furosemide (LASIX) 40 MG tablet, Take 1 tablet (40 mg total) by mouth 2 (two) times daily., Disp: 180 tablet, Rfl: 0 .  furosemide (LASIX) 40 MG tablet, TAKE 1 TABLET BY MOUTH EVERY DAY, Disp: 90 tablet, Rfl: 3 .  HUMALOG MIX 75/25 KWIKPEN (75-25) 100 UNIT/ML Kwikpen, Inject 30-40 Units into the skin See admin instructions. Inject 40 units SQ in Am and 30 units SQ in PM, Disp: , Rfl: 6 .  levothyroxine (SYNTHROID, LEVOTHROID) 50 MCG tablet, Take 50 mcg by mouth daily before breakfast. , Disp: , Rfl: 6 .  losartan (COZAAR)  25 MG tablet, TAKE 1 TABLET (25 MG TOTAL) BY MOUTH DAILY., Disp: 90 tablet, Rfl: 3 .  metoprolol tartrate (LOPRESSOR) 25 MG tablet, TAKE 1 TABLET (25 MG TOTAL) BY MOUTH 2 (TWO) TIMES DAILY., Disp: 180 tablet, Rfl: 1 .  nitrofurantoin, macrocrystal-monohydrate, (MACROBID) 100 MG capsule, Take 1 capsule (100 mg total) by mouth 2 (two) times daily for 7 days., Disp: 14 capsule, Rfl: 0 .  nitroGLYCERIN (NITROSTAT) 0.4 MG SL tablet, Place 1 tablet (0.4 mg total) under the tongue every 5 (five) minutes as needed for chest pain., Disp: 25 tablet, Rfl: 3 .  pravastatin (PRAVACHOL) 20 MG tablet, TAKE 1 TABLET BY MOUTH EVERY DAY IN THE EVENING, Disp: 30 tablet, Rfl: 10 .  spironolactone (ALDACTONE) 25 MG tablet, TAKE 1 TABLET BY MOUTH EVERY DAY, Disp: 90 tablet, Rfl: 1 .  Vitamin D, Ergocalciferol, (DRISDOL) 50000 units CAPS capsule, TAKE 1 CAPSULE BY MOUTH ON TUESDAY AND FRIDAY, Disp: 8 capsule, Rfl: 0   Allergies  Allergen Reactions  . Hydrocodone Hives and Itching  . Hydrocodone-Acetaminophen Hives  . Morphine And Related Other (See Comments)    GI PROBLEMS  . Rosuvastatin Other (See Comments)    Pt reports causes lower extremity muscle aches/cramping  . Lisinopril Cough  . Nsaids Other (See Comments)    GI PROBLEMS     Review of Systems  Constitutional: Negative.  Negative for fatigue.  Respiratory: Negative.   Cardiovascular: Negative.  Negative for chest pain.  Gastrointestinal: Negative.   Genitourinary: Positive for frequency.  Musculoskeletal: Positive for back pain.  Neurological: Negative.  Negative for weakness.     Today's Vitals   08/26/18 1450 08/26/18 1607  BP: (!) 152/78 126/88  Pulse: 64   Temp: 98.6 F (37 C)   TempSrc: Oral   Weight: 252 lb 3.2 oz (114.4 kg)   Height: 5\' 7"  (1.702 m)   PainSc: 0-No pain    Body mass index is 39.5 kg/m.   Objective:  Physical Exam  Constitutional: She appears well-developed and well-nourished.  HENT:  Head: Normocephalic and  atraumatic.  Eyes: EOM are normal.  Cardiovascular: Normal rate and normal heart sounds. An irregular rhythm present.  Pulmonary/Chest: Effort normal and breath sounds normal.  Musculoskeletal: Normal range of motion.       Lumbar back: She exhibits tenderness.       Back:  Tenderness, no overlying erythema  Nursing note and vitals reviewed.       Assessment And Plan:     1.  Benign hypertensive heart disease without heart failure  Fair control. She will continue with current meds. She is encouraged to avoid adding salt to her foods. Importance of regular exercise was also discussed with the patient. She is encouraged to exercise for 30 minutes at least five days weekly.   2. Atrial fibrillation with RVR (HCC)  Chronic, she is now rate-controlled on meds. She is encouraged to take meds as prescribed.   3. Uncontrolled type 2 diabetes mellitus with hyperglycemia (HCC)  WE DISCUSSED STARTING OZEMPIC TO HELP HER ACHIEVE GLYCEMIC CONTROL. SHE WILL START WITH 0.25MG  ONCE WEEKLY X 4 WEEKS, THEN 0.5MG  ONCE WEEKLY X 2 WEEKS.  SHE DENIES FAMILY HISTORY OF THYROID CANCER. POSSIBLE SIDE EFFECTS INCLUDING GI DISCOMFORT, CONSTIPATION, DIARRHEA AND NAUSEA WERE DISCUSSED WITH THE PATIENT IN FULL DETAIL. SHE WILL RTO IN SIX WEEKS FOR RE-EVALUATION.    - POCT Urinalysis Dipstick (81002) - POCT UA - Microalbumin  4. Chronic left-sided low back pain without sciatica  I will refer her to PT. She is also encouraged to consider dry needling.   - AMB referral to rehabilitation  5. Acute cystitis without hematuria  She was given abx and encouraged to complete full abx course. I will also send urine cx.   - Urine Culture; Future - Urine Culture        [name], MD

## 2018-09-01 ENCOUNTER — Other Ambulatory Visit: Payer: Self-pay

## 2018-09-01 ENCOUNTER — Emergency Department (HOSPITAL_COMMUNITY): Payer: Medicare Other

## 2018-09-01 ENCOUNTER — Emergency Department (HOSPITAL_COMMUNITY)
Admission: EM | Admit: 2018-09-01 | Discharge: 2018-09-01 | Disposition: A | Discharge status: Home / Self Care | Payer: Medicare Other | Attending: Emergency Medicine | Admitting: Emergency Medicine

## 2018-09-01 ENCOUNTER — Encounter (HOSPITAL_COMMUNITY): Payer: Self-pay

## 2018-09-01 DIAGNOSIS — Z87891 Personal history of nicotine dependence: Secondary | ICD-10-CM | POA: Diagnosis not present

## 2018-09-01 DIAGNOSIS — I2510 Atherosclerotic heart disease of native coronary artery without angina pectoris: Secondary | ICD-10-CM | POA: Diagnosis not present

## 2018-09-01 DIAGNOSIS — E119 Type 2 diabetes mellitus without complications: Secondary | ICD-10-CM | POA: Diagnosis not present

## 2018-09-01 DIAGNOSIS — Z794 Long term (current) use of insulin: Secondary | ICD-10-CM | POA: Insufficient documentation

## 2018-09-01 DIAGNOSIS — Z85118 Personal history of other malignant neoplasm of bronchus and lung: Secondary | ICD-10-CM | POA: Insufficient documentation

## 2018-09-01 DIAGNOSIS — Z9861 Coronary angioplasty status: Secondary | ICD-10-CM | POA: Insufficient documentation

## 2018-09-01 DIAGNOSIS — R1030 Lower abdominal pain, unspecified: Secondary | ICD-10-CM | POA: Diagnosis present

## 2018-09-01 DIAGNOSIS — Z79899 Other long term (current) drug therapy: Secondary | ICD-10-CM | POA: Insufficient documentation

## 2018-09-01 DIAGNOSIS — R109 Unspecified abdominal pain: Secondary | ICD-10-CM

## 2018-09-01 LAB — CBC WITH DIFFERENTIAL/PLATELET
Abs Immature Granulocytes: 0.02 10*3/uL (ref 0.00–0.07)
Basophils Absolute: 0 10*3/uL (ref 0.0–0.1)
Basophils Relative: 1 %
Eosinophils Absolute: 0.5 10*3/uL (ref 0.0–0.5)
Eosinophils Relative: 8 %
HCT: 33.7 % — ABNORMAL LOW (ref 36.0–46.0)
Hemoglobin: 10.7 g/dL — ABNORMAL LOW (ref 12.0–15.0)
Immature Granulocytes: 0 %
Lymphocytes Relative: 23 %
Lymphs Abs: 1.5 10*3/uL (ref 0.7–4.0)
MCH: 31.4 pg (ref 26.0–34.0)
MCHC: 31.8 g/dL (ref 30.0–36.0)
MCV: 98.8 fL (ref 80.0–100.0)
Monocytes Absolute: 0.6 10*3/uL (ref 0.1–1.0)
Monocytes Relative: 10 %
Neutro Abs: 3.8 10*3/uL (ref 1.7–7.7)
Neutrophils Relative %: 58 %
Platelets: 295 10*3/uL (ref 150–400)
RBC: 3.41 MIL/uL — ABNORMAL LOW (ref 3.87–5.11)
RDW: 11.9 % (ref 11.5–15.5)
WBC: 6.5 10*3/uL (ref 4.0–10.5)
nRBC: 0 % (ref 0.0–0.2)

## 2018-09-01 LAB — COMPREHENSIVE METABOLIC PANEL
ALT: 16 U/L (ref 0–44)
AST: 21 U/L (ref 15–41)
Albumin: 3.6 g/dL (ref 3.5–5.0)
Alkaline Phosphatase: 85 U/L (ref 38–126)
Anion gap: 8 (ref 5–15)
BUN: 21 mg/dL — ABNORMAL HIGH (ref 6–20)
CO2: 30 mmol/L (ref 22–32)
Calcium: 9 mg/dL (ref 8.9–10.3)
Chloride: 102 mmol/L (ref 98–111)
Creatinine, Ser: 1.17 mg/dL — ABNORMAL HIGH (ref 0.44–1.00)
GFR calc Af Amer: 57 mL/min — ABNORMAL LOW (ref >60)
GFR calc non Af Amer: 50 mL/min — ABNORMAL LOW (ref >60)
Glucose, Bld: 102 mg/dL — ABNORMAL HIGH (ref 70–99)
Potassium: 3.8 mmol/L (ref 3.5–5.1)
Sodium: 140 mmol/L (ref 135–145)
Total Bilirubin: 0.6 mg/dL (ref 0.3–1.2)
Total Protein: 7.3 g/dL (ref 6.5–8.1)

## 2018-09-01 LAB — URINALYSIS, ROUTINE W REFLEX MICROSCOPIC
Bilirubin Urine: NEGATIVE
Glucose, UA: NEGATIVE mg/dL
Hgb urine dipstick: NEGATIVE
Ketones, ur: NEGATIVE mg/dL
Leukocytes, UA: NEGATIVE
Nitrite: NEGATIVE
Protein, ur: NEGATIVE mg/dL
Specific Gravity, Urine: 1.017 (ref 1.005–1.030)
pH: 5 (ref 5.0–8.0)

## 2018-09-01 LAB — LIPASE, BLOOD: Lipase: 29 U/L (ref 11–51)

## 2018-09-01 MED ORDER — FENTANYL CITRATE (PF) 100 MCG/2ML IJ SOLN
50.0000 ug | Freq: Once | INTRAMUSCULAR | Status: DC
  Filled 2018-09-01: qty 2

## 2018-09-01 MED ORDER — METHOCARBAMOL 500 MG PO TABS
500.0000 mg | ORAL_TABLET | Freq: Once | ORAL | Status: AC
  Administered 2018-09-01: 500 mg via ORAL
  Filled 2018-09-01: qty 1

## 2018-09-01 MED ORDER — METHOCARBAMOL 500 MG PO TABS: 500.0000 mg | ORAL_TABLET | Freq: Two times a day (BID) | ORAL | 0 refills | Status: DC

## 2018-09-01 MED ORDER — ONDANSETRON HCL 4 MG/2ML IJ SOLN
4.0000 mg | Freq: Once | INTRAMUSCULAR | Status: AC
  Administered 2018-09-01: 4 mg via INTRAVENOUS
  Filled 2018-09-01: qty 2

## 2018-09-01 MED ORDER — OXYCODONE-ACETAMINOPHEN 5-325 MG PO TABS: 1.0000 | ORAL_TABLET | Freq: Four times a day (QID) | ORAL | 0 refills | Status: DC | PRN

## 2018-09-01 MED ORDER — ONDANSETRON HCL 4 MG PO TABS: 4.0000 mg | ORAL_TABLET | Freq: Four times a day (QID) | ORAL | 0 refills | Status: DC

## 2018-09-01 MED ORDER — METRONIDAZOLE 500 MG PO TABS: 500.0000 mg | ORAL_TABLET | Freq: Three times a day (TID) | ORAL | 0 refills | Status: DC

## 2018-09-01 MED ORDER — CIPROFLOXACIN HCL 500 MG PO TABS: 500.0000 mg | ORAL_TABLET | Freq: Two times a day (BID) | ORAL | 0 refills | Status: DC

## 2018-09-01 MED ORDER — LIDOCAINE 5 % EX PTCH: 1.0000 | MEDICATED_PATCH | CUTANEOUS | 0 refills | Status: DC

## 2018-09-01 MED ORDER — FENTANYL CITRATE (PF) 100 MCG/2ML IJ SOLN
50.0000 ug | Freq: Once | INTRAMUSCULAR | Status: AC
  Administered 2018-09-01: 50 ug via INTRAVENOUS
  Filled 2018-09-01: qty 2

## 2018-09-01 MED ORDER — HYDROMORPHONE HCL 1 MG/ML IJ SOLN
1.0000 mg | Freq: Once | INTRAMUSCULAR | Status: AC
  Administered 2018-09-01: 1 mg via INTRAVENOUS
  Filled 2018-09-01: qty 1

## 2018-09-01 NOTE — ED Triage Notes (Addendum)
Pt arrives via POV from home. Pt reports she is having left flank pain. Pt reports hx of kidney stones in May. Pt reports that this pain feels differently. Pt reports that the pain is worse upon movement

## 2018-09-01 NOTE — ED Notes (Signed)
Pt made aware of needed urine sample

## 2018-09-01 NOTE — ED Provider Notes (Signed)
[facility] DEPT Provider Note   CSN: [id number] Arrival date & time: 09/01/18  0706     History   Chief Complaint Chief Complaint  Patient presents with  . Flank Pain    HPI NAZIFA TRINKA is a 62 y.o. female with extensive past medical history as below who presents emergency department today for flank pain versus back pain.  Patient reports on Saturday she was reaching for a paper with her right hand when she felt a "twinge" in her left thoracic and lumbar back.  She reports since that time she has been having back pain that is worse when going from a sitting to standing position, bending down and standing up, trying to raise her legs to get in bed, and twisting.  She is tried her sister's Flexeril for this without relief.  She notes however the pain worsened yesterday and became more severe.  She feels the pain is not similar to when she has had a kidney stone.  The patient denies any fever, chills, chest pain, shortness of breath, nausea/vomiting, urinary frequency, urinary urgency, dysuria, hematuria.  She denies any fever, IV drug use, numbness/tingling/weakness of the lower extremities, radiation of the pain to the lower extremities, urinary retention, bowel/bladder incontinence, saddle anesthesia.  She denies any falls or trauma.  HPI  Past Medical History:  Diagnosis Date  . Allergy   . Anemia   . Anemia   . Aortic insufficiency    a. Mod by echo 05/2015.  . Arthritis    "knees" (07/11/2015)  . Atrial fibrillation (HCC)    On Eliquis  . 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 Complication of anesthesia    "I had a hard time waking me up w/one of my shoulder surgeries"  . Coronary artery disease    a. Abnormal stress test -> LHc 06/2015 s/p DES to mCX and mLAD, residual D2 disease treated medially.  . Diverticulitis   .  Elevated blood pressure   . Erythema nodosum   . Heart murmur   . Insomnia   . lung ca dx'd 10/1999   chemo/xrt comp 05/29/2000  . Myalgia   . Nephrolithiasis 2015  . Obesity   . Pericardial effusion    a. 05/2015 Echo: EF 55-60%, no rwma, Gr 1 DD, no effusion - but an effusion was seen on CT which was felt to be increased in size compared to 12/2014 - pericardium also thickened.   . Pericarditis    a. Dx 05/2015.  [name] Pneumothorax, spontaneous, tension 01/20/2012   October, 2010  . Pulmonary fibrosis ([place]) 01/20/2012   Due to previous surgery and chest radiation for lung cancer  . Restrictive lung disease   . Type II diabetes mellitus [facility])     Patient Active Problem List   Diagnosis Date Noted  . Diverticulosis of colon without hemorrhage   . Acute on chronic diastolic CHF (congestive heart failure) ([place]) 08/04/2017  . 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 Unstable angina ([place]) 04/12/2017  . Atrial fibrillation with RVR ([place])   . Hyperlipidemia 08/07/2015  . CAD S/P percutaneous coronary angioplasty 07/25/2015  . Hypertension, essential   . Aortic insufficiency   . Stented coronary artery   . Abnormal nuclear stress test 07/11/2015  . Abnormal stress test 07/04/2015  . Pericarditis   . Controlled type 2 diabetes mellitus without  complication, with long-term current use of insulin ([place])   . Pericardial effusion   . Chest pain 05/30/2015  . 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 Open angle with borderline findings and low glaucoma risk in both eyes 05/23/2015  . Restrictive lung disease 03/14/2015  . 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 Insomnia   . Obesity   . Anemia   . Kidney stones   . UNDIAGNOSED CARDIAC MURMURS 02/24/2009  . Lung cancer ([place]) 09/20/2008  . DYSPNEA 09/20/2008  . DIABETES MELLITUS, TYPE II  06/19/2007    Past Surgical History:  Procedure Laterality Date  . ABDOMINAL HYSTERECTOMY  2008  . APPENDECTOMY  1969?  [name] CARDIAC CATHETERIZATION N/A 07/11/2015   Procedure: Left Heart Cath and Coronary Angiography;  Surgeon: Jettie Booze, MD;  Location: [place] [facility];  Service: Cardiovascular;  Laterality: N/A;  . CARDIAC CATHETERIZATION  07/11/2015   Procedure: Coronary Stent Intervention;  Surgeon: Jettie Booze, MD;  Location: [place] [facility] LAB;  Service: Cardiovascular;;  . CARPAL TUNNEL RELEASE Bilateral 1998-2005?   right-left  . CARPAL TUNNEL RELEASE Left 2009?   "for the 2nd time"  . COLONOSCOPY    . COLONOSCOPY WITH PROPOFOL N/A 05/28/2018   Procedure: COLONOSCOPY WITH PROPOFOL;  Surgeon: Milus Banister, MD;  Location: [facility];  Service: Endoscopy;  Laterality: N/A;  . CORONARY ANGIOPLASTY    . CYSTOSCOPY W/ STONE MANIPULATION  2013  . ESOPHAGOGASTRODUODENOSCOPY (EGD) WITH ESOPHAGEAL DILATION  2012  . FOOT SURGERY Bilateral 1980's   Callous removed   . INNER EAR SURGERY Right ~ 2009  . KNEE ARTHROPLASTY Right 1991  . KNEE ARTHROSCOPY  ~ 2000   LATERAL RELEASE  . LEFT HEART CATH AND CORONARY ANGIOGRAPHY N/A 12/18/2016   Procedure: Left Heart Cath and Coronary Angiography;  Surgeon: Burnell Blanks, MD;  Location: [place] [facility];  Service: Cardiovascular;  Laterality: N/A;  . LUNG CANCER SURGERY Left 2001   "small cell"  . LUNG SURGERY Right 2007   "reinflated it"  . PULMONARY EMBOLISM SURGERY  2007   LUNG COLLAPSE  . RIGHT AND LEFT HEART CATH N/A 06/25/2017   Procedure: RIGHT AND LEFT HEART CATH;  Surgeon: Sherren Mocha, MD;  Location: [place] [facility];  Service: Cardiovascular;  Laterality: N/A;  . SHOULDER ADHESION RELEASE Right ~ 2004  . SHOULDER ARTHROSCOPY W/ ROTATOR CUFF REPAIR Right ~ 2003     OB History   None      Home Medications    Prior to Admission medications   Medication Sig Start Date End Date Taking?  Authorizing Provider  co-enzyme Q-10 50 MG capsule TAKE 1 CAPSULE (50 MG TOTAL) BY MOUTH DAILY. 05/29/18   Dorothy Spark, MD  cyclobenzaprine (FLEXERIL) 10 MG tablet Take 1 tablet (10 mg total) by mouth at bedtime as needed for muscle spasms (back pain). 08/26/18   Glendale Chard, MD  diltiazem (CARDIZEM CD) 240 MG 24 hr capsule TAKE 1 CAPSULE BY MOUTH EVERY DAY 04/20/18   Dorothy Spark, MD  ELIQUIS 5 MG TABS tablet TAKE 1 TABLET BY MOUTH TWICE A DAY 06/04/18   Dorothy Spark, MD  furosemide (LASIX) 40 MG tablet Take 1 tablet (40 mg total) by mouth 2 (two) times daily. 12/17/17 05/22/18  Dorothy Spark, MD  furosemide (LASIX) 40 MG tablet TAKE 1 TABLET BY MOUTH EVERY  DAY 08/26/18   Dorothy Spark, MD  HUMALOG MIX 75/25 KWIKPEN (75-25) 100 UNIT/ML Kwikpen Inject 30-40 Units into the skin See admin instructions. Inject 40 units SQ in Am and 30 units SQ in PM 12/22/15   [provider]  levothyroxine (SYNTHROID, LEVOTHROID) 50 MCG tablet Take 50 mcg by mouth daily before breakfast.  05/14/17   [provider]  losartan (COZAAR) 25 MG tablet TAKE 1 TABLET (25 MG TOTAL) BY MOUTH DAILY. 10/03/17   Dorothy Spark, MD  metoprolol tartrate (LOPRESSOR) 25 MG tablet TAKE 1 TABLET (25 MG TOTAL) BY MOUTH 2 (TWO) TIMES DAILY. 08/14/18   Dorothy Spark, MD  nitrofurantoin, macrocrystal-monohydrate, (MACROBID) 100 MG capsule Take 1 capsule (100 mg total) by mouth 2 (two) times daily for 7 days. 08/27/18 09/03/18  Glendale Chard, MD  nitroGLYCERIN (NITROSTAT) 0.4 MG SL tablet Place 1 tablet (0.4 mg total) under the tongue every 5 (five) minutes as needed for chest pain. 01/02/18 05/22/18  Lyda Jester M, PA-C  pravastatin (PRAVACHOL) 20 MG tablet TAKE 1 TABLET BY MOUTH EVERY DAY IN THE EVENING 02/18/18   Dorothy Spark, MD  spironolactone (ALDACTONE) 25 MG tablet TAKE 1 TABLET BY MOUTH EVERY DAY 07/06/18   Dorothy Spark, MD  Vitamin D, Ergocalciferol, (DRISDOL) 50000 units  CAPS capsule TAKE 1 CAPSULE BY MOUTH ON TUESDAY AND FRIDAY 07/27/18   Glendale Chard, MD    Family History Family History  Problem Relation Age of Onset  . Heart disease Father   . Diabetes Father   . Pancreatic cancer Mother   . Diabetes Mother   . Hypertension Sister        x 3  . Diabetes Sister        x 4  . Diabetes Maternal Grandmother   . Diabetes Paternal Grandmother   . Diabetes Sister   . Diabetes Sister   . Diabetes Sister   . Kidney disease Sister   . Colon cancer Neg Hx   . Colon polyps Neg Hx   . Rectal cancer Neg Hx   . Stomach cancer Neg Hx   . Esophageal cancer Neg Hx   . Breast cancer Neg Hx     Social History Social History   Tobacco Use  . Smoking status: Former Smoker    Packs/day: 0.25    Years: 5.00    Pack years: 1.25    Types: Cigarettes    Last attempt to quit: 07/22/1999    Years since quitting: 19.1  . Smokeless tobacco: Never Used  Substance Use Topics  . Alcohol use: No    Alcohol/week: 0.0 standard drinks  . Drug use: No     Allergies   Hydrocodone; Hydrocodone-acetaminophen; Morphine and related; Rosuvastatin; Lisinopril; and Nsaids   Review of Systems Review of Systems  All other systems reviewed and are negative.    Physical Exam Updated Vital Signs BP (!) 145/77 (BP Location: Left Arm)   Pulse 86   Temp 98.1 F (36.7 C) (Oral)   Resp 20   Wt 111.6 kg   SpO2 96%   BMI 38.53 kg/m   Physical Exam  Constitutional: She appears well-developed and well-nourished. No distress.  Non-toxic appearing  HENT:  Head: Normocephalic and atraumatic.  Right Ear: External ear normal.  Left Ear: External ear normal.  Nose: Nose normal.  Mouth/Throat: Uvula is midline, oropharynx is clear and moist and mucous membranes are normal. No tonsillar exudate.  Eyes: Pupils are equal, round, and reactive  to light. Right eye exhibits no discharge. Left eye exhibits no discharge. No scleral icterus.  Neck: Trachea normal and normal  range of motion. Neck supple. No spinous process tenderness present. No neck rigidity. Normal range of motion present.  Cardiovascular: Normal rate, regular rhythm, normal heart sounds and intact distal pulses.  No murmur heard. Pulses:      Radial pulses are 2+ on the right side, and 2+ on the left side.       Dorsalis pedis pulses are 2+ on the right side, and 2+ on the left side.       Posterior tibial pulses are 2+ on the right side, and 2+ on the left side.  Pulmonary/Chest: Effort normal and breath sounds normal. No respiratory distress. She exhibits no tenderness.  Abdominal: Soft. Bowel sounds are normal. She exhibits no distension and no pulsatile midline mass. There is no tenderness. There is CVA tenderness (vs back pain). There is no rigidity, no rebound and no guarding.  Musculoskeletal: She exhibits no edema.       Back:  Posterior and appearance appears normal. No evidence of obvious scoliosis or kyphosis. No obvious signs of skin changes, trauma, deformity, infection. No C, T, or L spine tenderness or step-offs to palpation. No C paraspinal tenderness. There is left paraspinal thoracic and lumbar paraspinal ttp. Lung expansion normal. Bilateral lower extremity strength 5 out of 5 including extensor hallucis longus. Patellar and Achilles deep tendon reflex 2+ and equal bilaterally. Sensation of lower extremities grossly intact. Straight leg right neg. Straight leg left neg. Gait able but patient notes painful. She uses a cane at baseline. Lower extremity compartments soft. PT and DP 2+ b/l. Cap refill <2 seconds.   Lymphadenopathy:    She has no cervical adenopathy.  Neurological: She is alert.  No foot drop. Normal strength for plantar flexion and dorsiflexion of lower extremities. Able gait with cane (uses at baseline). Normal sensation to lower extremities b/l.   Skin: Skin is warm, dry and intact. Capillary refill takes less than 2 seconds. No rash noted. She is not diaphoretic. No  erythema.  No vesicular-like rash noted.  Psychiatric: She has a normal mood and affect.  Nursing note and vitals reviewed.    ED Treatments / Results  Labs (all labs ordered are listed, but only abnormal results are displayed) Labs Reviewed  COMPREHENSIVE METABOLIC PANEL - Abnormal; Notable for the following components:      Result Value   Glucose, Bld 102 (*)    BUN 21 (*)    Creatinine, Ser 1.17 (*)    GFR calc non Af Amer 50 (*)    GFR calc Af Amer 57 (*)    All other components within normal limits  CBC WITH DIFFERENTIAL/PLATELET - Abnormal; Notable for the following components:   RBC 3.41 (*)    Hemoglobin 10.7 (*)    HCT 33.7 (*)    All other components within normal limits  LIPASE, BLOOD  URINALYSIS, ROUTINE W REFLEX MICROSCOPIC    EKG None  Radiology Ct Renal Stone Study  Result Date: 09/01/2018 CLINICAL DATA:  Left flank pain, history of kidney stones, the pain is somewhat worse upon movement EXAM: CT ABDOMEN AND PELVIS WITHOUT CONTRAST TECHNIQUE: Multidetector CT imaging of the abdomen and pelvis was performed following the standard protocol without IV contrast. COMPARISON:  CT abdomen pelvis of 06/11/2017 FINDINGS: Lower chest: The lung bases are clear. There is some elevation of of the left hemidiaphragm. There is a small  pericardial effusion present which was present on the prior CT. Coronary artery calcifications are also noted. Hepatobiliary: The liver is unremarkable in the unenhanced state. No calcified gallstones are seen, but higher attenuation debris does layer within the gallbladder which may represent gallbladder sludge or possibly noncalcified gallstones. Pancreas: The pancreas is normal in size and the pancreatic duct is not dilated. Spleen: The spleen is unremarkable. Adrenals/Urinary Tract: The adrenal glands appear normal. No renal calculi are seen and there is no evidence of hydronephrosis. No renal mass is evident. The ureters are normal in caliber.  The urinary bladder is decompressed and cannot be evaluated. Stomach/Bowel: The stomach is largely decompressed containing some higher attenuation debris possibly ingested material within the fundus dependently. No dilatation of small bowel or edema is seen. There are multiple diverticula throughout the entire colon. Within the rectosigmoid colon near the junction with the distal ileum there is very faint haziness which could represent very mild diverticulitis but is certainly not diagnostic. The terminal ileum is unremarkable, and the patient has previously undergone appendectomy. Vascular/Lymphatic: The abdominal aorta is normal in caliber and there is mild abdominal aortic atherosclerosis noted. No adenopathy is seen. Reproductive: The uterus has previously been resected. No adnexal lesion is seen. There is no evidence of free fluid within the pelvis. Other: None. Musculoskeletal: The hips are in normal position, and there is only mild degenerative joint disease of the hips. There are degenerative changes within the SI joints. The lumbar vertebrae are in normal alignment with normal intervertebral disc spaces. IMPRESSION: 1. No definite explanation for the patient's left flank pain is seen. No renal or ureteral calculi are noted and there is no evidence of hydronephrosis. 2. There is vague faint strandiness surrounding a short segment of the sigmoid colon near the junction with the descending colon which could represent mild diverticulitis in the proper clinical setting, but certainly is not diagnostically of that entity. Correlate clinically. 3. There are diverticula scattered throughout the entire colon. 4. Small pericardial effusion which was present previously. Electronically Signed   By: Ivar Drape M.D.   On: 09/01/2018 09:26    Procedures Procedures (including critical care time)  Medications Ordered in ED Medications  ondansetron (ZOFRAN) injection 4 mg (4 mg Intravenous Given 09/01/18 0811)    fentaNYL (SUBLIMAZE) injection 50 mcg (50 mcg Intravenous Given 09/01/18 0812)  methocarbamol (ROBAXIN) tablet 500 mg (500 mg Oral Given 09/01/18 0812)  HYDROmorphone (DILAUDID) injection 1 mg (1 mg Intravenous Given 09/01/18 1053)     Initial Impression / Assessment and Plan / ED Course  I have reviewed the triage vital signs and the nursing notes.  Pertinent labs & imaging results that were available during my care of the patient were reviewed by me and considered in my medical decision making (see chart for details).     62 y.o. female presenting for back pain vs flank pain.  Vital signs are reassuring on presentation.  No neurologic deficits and normal neuro exam.  Patient can walk but states it is painful.  No bowel or bladder incontinence.  No urinary retention or saddle anesthesia.  No concern for cauda equina.  No history of trauma, personal history of cancer, night sweats or weight loss that would warrant a x-ray at this time.  No spinal injections, fever or IV drug use that make me concerned for spinal hematoma or abscess.  No pulsatile mass of the abdomen.  No abdominal tenderness to palpation or guarding to make me concern for intra-abdominal  pathology.  Will obtain lab work and CT renal stone study to rule out kidney stone.  Will evaluate urine to make sure this is not pyelonephritis.  Pain medication ordered.  Vital signs reviewed and reassuring.  Patient without leukocytosis.  UA without evidence of UTI.  Do not suspect pyelonephritis.  Creatinine at baseline.  CT scan is without evidence of renal or ureteral stone.  There is no evidence of hydro.  There is possible mild diverticulitis.  I will low clinical suspicion for this given the patient has afebrile, without nausea or vomiting, abdominal tenderness. Likely MSK pain.  However after discussing the patient risk versus benefits of being treated she wishes to be treated as she is concerned that she may be developing a diverticulitis  as this is how she is presented in the past.  Discussed not to drink alcohol taking Flagyl and risk for tendon injury while taking Cipro.  Repeat abdominal exam is without any peritoneal signs. Do not feel needs CT with contrast at this time. Pain controlled in the department.  I advised the patient to follow-up with pcp in the next 2-3 days. She is to call today. Specific return precautions discussed. Time was given for all questions to be answered. The patient verbalized understanding and agreement with plan. The patient appears safe for discharge home.   Patient reviewed in [place] Controlled Substance Reporting System. Patient tolerated this medication in the past per chart and patient. Discussed reasons to return if allergy were to occur.   Final Clinical Impressions(s) / ED Diagnoses   Final diagnoses:  Flank pain    ED Discharge Orders         Ordered    ciprofloxacin (CIPRO) 500 MG tablet  2 times daily     09/01/18 1105    metroNIDAZOLE (FLAGYL) 500 MG tablet  3 times daily     09/01/18 1105    lidocaine (LIDODERM) 5 %  Every 24 hours     09/01/18 1105    methocarbamol (ROBAXIN) 500 MG tablet  2 times daily     09/01/18 1105    ondansetron (ZOFRAN) 4 MG tablet  Every 6 hours     09/01/18 1105    oxyCODONE-acetaminophen (PERCOCET/ROXICET) 5-325 MG tablet  Every 6 hours PRN     09/01/18 1109           Lorelle Gibbs 09/01/18 1112    Duffy Bruce, MD 09/02/18 [id number]

## 2018-09-01 NOTE — ED Notes (Signed)
Patient transported to CT 

## 2018-09-01 NOTE — ED Notes (Signed)
ED Provider at bedside. 

## 2018-09-01 NOTE — Discharge Instructions (Addendum)
Take medications as prescribed.   Call your pcp today to schedule a follow up appointment in the next 2-3 days.   If you develop worsening or new concerning symptoms you can return to the emergency department for re-evaluation.   Please note that you should not drink alcohol while taking flagyl is at will make you sick. Please note that ciprofloxacin can lead to increased risk for tendon rupture. Please avoid high impact activities while taking this medication.   Muscle relaxants (Robaxin):  These medications can help with muscle tightness that is a cause of lower back pain.  Most of these medications can cause drowsiness, and it is not safe to drive or use dangerous machinery while taking them. They are primarily helpful when taken at night before sleep.   For breakthrough pain you may take Percocet. Do not drink alcohol drive or operate heavy machinery when taking. You are being provided a prescription for opiates (also known as narcotics) for pain control on an as needed basis.  Opiates can be addictive and should only be used when absolutely necessary for pain control when other alternatives do not work.  We recommend you only use them for the recommended amount of time and only as prescribed.  Please do not take with other sedative medications or alcohol.  Please do not drive, operate machinery, or make important decisions while taking opiates.  Please note that these medications can be addictive and have high abuse potential.  Please keep these medications locked away from children, teenagers or any family members with history of substance abuse. Additionally, these medications may cause constipation - take over the counter stool softeners or add fiber to your diet to treat this (Metamucil, Psyllium Fiber, Colace, Miralax) Further refills will need to be obtained from your primary care doctor and will not be prescribed through the Emergency Department. You will test positive on most drug tests  while taking this medication.   Do not take your medicine if develop an itchy rash, swelling in your mouth or lips, or difficulty breathing.

## 2018-09-02 ENCOUNTER — Telehealth: Payer: Self-pay

## 2018-09-02 NOTE — Telephone Encounter (Signed)
Returned the pt's call to schedule her an appt for an ER f/u and the pt said that she is leaving breakthrough physical therapy and that she will schedule an appt for a f/u form the ER if her back doesn't get better.

## 2018-09-23 ENCOUNTER — Encounter: Payer: Self-pay | Admitting: Internal Medicine

## 2018-09-23 ENCOUNTER — Ambulatory Visit: Payer: Medicare Other | Admitting: Internal Medicine

## 2018-09-23 VITALS — BP 114/68 | HR 102 | Temp 97.8°F | Ht 67.0 in | Wt 241.6 lb

## 2018-09-23 DIAGNOSIS — N183 Chronic kidney disease, stage 3 unspecified: Secondary | ICD-10-CM

## 2018-09-23 DIAGNOSIS — Z6837 Body mass index (BMI) 37.0-37.9, adult: Secondary | ICD-10-CM

## 2018-09-23 DIAGNOSIS — E1122 Type 2 diabetes mellitus with diabetic chronic kidney disease: Secondary | ICD-10-CM | POA: Diagnosis not present

## 2018-09-23 DIAGNOSIS — F5101 Primary insomnia: Secondary | ICD-10-CM

## 2018-09-23 DIAGNOSIS — Z794 Long term (current) use of insulin: Secondary | ICD-10-CM

## 2018-09-23 MED ORDER — TRAZODONE HCL 50 MG PO TABS: 50.0000 mg | ORAL_TABLET | Freq: Every day | ORAL | 1 refills | Status: DC

## 2018-09-23 NOTE — Patient Instructions (Signed)

## 2018-09-24 LAB — BMP8+EGFR
BUN/Creatinine Ratio: 18 (ref 12–28)
BUN: 17 mg/dL (ref 8–27)
CO2: 24 mmol/L (ref 20–29)
Calcium: 9.1 mg/dL (ref 8.7–10.3)
Chloride: 104 mmol/L (ref 96–106)
Creatinine, Ser: 0.94 mg/dL (ref 0.57–1.00)
GFR calc Af Amer: 76 mL/min/{1.73_m2} (ref >59)
GFR calc non Af Amer: 66 mL/min/{1.73_m2} (ref >59)
Glucose: 126 mg/dL — ABNORMAL HIGH (ref 65–99)
Potassium: 4.5 mmol/L (ref 3.5–5.2)
Sodium: 144 mmol/L (ref 134–144)

## 2018-09-24 NOTE — Progress Notes (Signed)
Here are your lab results:  Your kidney function has improved! This is great! As you incorporate more exercise into your daily routine, you will continue to lose weight and we will be able to cut back on your insulin.   Angela Nevin Christmas to you and your family! I look forward to seeing you at your next visit!  Sincerely,    Robyn N. Baird Cancer, MD

## 2018-09-27 ENCOUNTER — Other Ambulatory Visit: Payer: Self-pay | Admitting: Cardiology

## 2018-10-03 ENCOUNTER — Encounter: Payer: Self-pay | Admitting: Internal Medicine

## 2018-10-04 NOTE — Progress Notes (Signed)
Subjective:     Patient ID: [name] , female    DOB: [birth year] , 62 y.o.   MRN: [id number]   Chief Complaint  Patient presents with  . Diabetes    HPI  She is here today for f/u diabetes. She is also followed by Dr. [name]. I started her on Ozempic once weekly at her last visit. She has not had any issues with the medication. Again, pt denies family/personal h/o thyroid cancer. She would like to continue with this. She has also noticed improved blood sugars.   Diabetes  She presents for her follow-up diabetic visit. She has type 2 diabetes mellitus. Her disease course has been improving.     Past Medical History:  Diagnosis Date  . Allergy   . Anemia   . Anemia   . Aortic insufficiency    a. Mod by echo 05/2015.  . Arthritis    "knees" (07/11/2015)  . Atrial fibrillation (HCC)    On Eliquis  . 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 Complication of anesthesia    "I had a hard time waking me up w/one of my shoulder surgeries"  . Coronary artery disease    a. Abnormal stress test -> LHc 06/2015 s/p DES to mCX and mLAD, residual D2 disease treated medially.  . Diverticulitis   . Elevated blood pressure   . Erythema nodosum   . Heart murmur   . Insomnia   . lung ca dx'd 10/1999   chemo/xrt comp 05/29/2000  . Myalgia   . Nephrolithiasis 2015  . Obesity   . Pericardial effusion    a. 05/2015 Echo: EF 55-60%, no rwma, Gr 1 DD, no effusion - but an effusion was seen on CT which was felt to be increased in size compared to 12/2014 - pericardium also thickened.   . Pericarditis    a. Dx 05/2015.  [name] Pneumothorax, spontaneous, tension 01/20/2012   October, 2010  . Pulmonary fibrosis ([place]) 01/20/2012   Due to previous surgery and chest radiation for lung cancer  . Restrictive lung disease   . Type II diabetes mellitus (HCC)      Family History  Problem Relation  Age of Onset  . Heart disease Father   . Diabetes Father   . Pancreatic cancer Mother   . Diabetes Mother   . Hypertension Sister        x 3  . Diabetes Sister        x 4  . Diabetes Maternal Grandmother   . Diabetes Paternal Grandmother   . Diabetes Sister   . Diabetes Sister   . Diabetes Sister   . Kidney disease Sister   . Colon cancer Neg Hx   . Colon polyps Neg Hx   . Rectal cancer Neg Hx   . Stomach cancer Neg Hx   . Esophageal cancer Neg Hx   . Breast cancer Neg Hx      Current Outpatient Medications:  .  co-enzyme Q-10 50 MG capsule, TAKE 1 CAPSULE (50 MG TOTAL) BY MOUTH DAILY., Disp: 90 capsule, Rfl: 1 .  diltiazem (CARDIZEM CD) 240 MG 24 hr capsule, TAKE 1 CAPSULE BY MOUTH EVERY DAY, Disp: 90 capsule, Rfl: 2 .  ELIQUIS 5 MG TABS tablet, TAKE 1 TABLET BY MOUTH TWICE A DAY, Disp: 180 tablet, Rfl: 1 .  furosemide (LASIX) 40 MG tablet, TAKE 1  TABLET BY MOUTH EVERY DAY (Patient taking differently: as directed. ), Disp: 90 tablet, Rfl: 3 .  HUMALOG MIX 75/25 KWIKPEN (75-25) 100 UNIT/ML Kwikpen, Inject 30-40 Units into the skin See admin instructions. Inject 40 units SQ in Am and 30 units SQ in PM, Disp: , Rfl: 6 .  levothyroxine (SYNTHROID, LEVOTHROID) 50 MCG tablet, Take 50 mcg by mouth daily before breakfast. , Disp: , Rfl: 6 .  losartan (COZAAR) 25 MG tablet, TAKE 1 TABLET (25 MG TOTAL) BY MOUTH DAILY., Disp: 90 tablet, Rfl: 3 .  methocarbamol (ROBAXIN) 500 MG tablet, Take 1 tablet (500 mg total) by mouth 2 (two) times daily., Disp: 20 tablet, Rfl: 0 .  metoprolol tartrate (LOPRESSOR) 25 MG tablet, TAKE 1 TABLET (25 MG TOTAL) BY MOUTH 2 (TWO) TIMES DAILY., Disp: 180 tablet, Rfl: 1 .  nitroGLYCERIN (NITROSTAT) 0.4 MG SL tablet, Place 1 tablet (0.4 mg total) under the tongue every 5 (five) minutes as needed for chest pain., Disp: 25 tablet, Rfl: 3 .  pravastatin (PRAVACHOL) 20 MG tablet, TAKE 1 TABLET BY MOUTH EVERY DAY IN THE EVENING, Disp: 30 tablet, Rfl: 10 .   Semaglutide,0.25 or 0.5MG/DOS, (OZEMPIC, 0.25 OR 0.5 MG/DOSE,) 2 MG/1.5ML SOPN, Inject 0.25 mg into the skin., Disp: , Rfl:  .  Vitamin D, Ergocalciferol, (DRISDOL) 50000 units CAPS capsule, TAKE 1 CAPSULE BY MOUTH ON TUESDAY AND FRIDAY, Disp: 8 capsule, Rfl: 0 .  cyclobenzaprine (FLEXERIL) 10 MG tablet, Take 1 tablet (10 mg total) by mouth at bedtime as needed for muscle spasms (back pain). (Patient not taking: Reported on 09/23/2018), Disp: 30 tablet, Rfl: 0 .  spironolactone (ALDACTONE) 25 MG tablet, Take 1 tablet (25 mg total) by mouth daily. Please make yearly appt with Dr. [name] for March for future refills. 1st attempt, Disp: 90 tablet, Rfl: 0 .  traZODone (DESYREL) 50 MG tablet, Take 1 tablet (50 mg total) by mouth at bedtime., Disp: 30 tablet, Rfl: 1   Allergies  Allergen Reactions  . Hydrocodone Hives and Itching  . Hydrocodone-Acetaminophen Hives  . Morphine And Related Other (See Comments)    GI PROBLEMS  . Rosuvastatin Other (See Comments)    Pt reports causes lower extremity muscle aches/cramping  . Lisinopril Cough  . Nsaids Other (See Comments)    GI PROBLEMS     Review of Systems  Constitutional: Negative.   Respiratory: Negative.   Cardiovascular: Negative.   Gastrointestinal: Negative.   Neurological: Negative.   Psychiatric/Behavioral: Positive for sleep disturbance (she c/o trouble sleeping at night. has issues with both falling and staying asleep. she denies having any night sweats. ).     Today's Vitals   09/23/18 1431  BP: 114/68  Pulse: (!) 102  Temp: 97.8 F (36.6 C)  TempSrc: Oral  Weight: 241 lb 9.6 oz (109.6 kg)  Height: 5' 7" (1.702 m)   Body mass index is 37.84 kg/m.   Objective:  Physical Exam Vitals signs and nursing note reviewed.  Constitutional:      Appearance: Normal appearance. She is obese.  HENT:     Head: Normocephalic and atraumatic.  Cardiovascular:     Rate and Rhythm: Normal rate and regular rhythm.  Pulmonary:      Effort: Pulmonary effort is normal.     Breath sounds: Normal breath sounds.  Skin:    General: Skin is warm and dry.  Neurological:     Mental Status: She is alert.  Psychiatric:        Mood and  Affect: Mood normal.         Assessment And Plan:     1. Type 2 diabetes mellitus with stage 3 chronic kidney disease, with long-term current use of insulin ([place])  I will check a bmet today. I am hoping that I will eventually be able to reduce the amount of insulin she needs. She is congratulated on her progress thus far. She will be given rx for ozempic, 0.94m once weekly. I anticipate increasing her to 156min the future.   - BMP8+EGFR  2. Chronic renal disease, stage III (HCC)  Chronic. She is encouraged to stay well hydrated.   - BMP8+EGFR  3. Primary insomnia  She was given rx trazodone [id number]. She is advised to contact me in two weeks to let me know how this is working for her.   4. Class 2 severe obesity due to excess calories with serious comorbidity and body mass index (BMI) of 37.0 to 37.9 in adult ([facility] congrat[facility] on her 11 pound weight loss since her last visit. She is encouraged to keep up the great work. She is encouraged to strive for BMI less than 32 to decrease cardiac risk. She is also encouraged to exercise no less than five days weekly for at least 30 minutes.   RobMaximino GreenlandD

## 2018-10-16 ENCOUNTER — Other Ambulatory Visit: Payer: Self-pay | Admitting: Internal Medicine

## 2018-10-28 ENCOUNTER — Ambulatory Visit: Payer: Medicare Other | Admitting: Cardiology

## 2018-10-28 ENCOUNTER — Encounter: Payer: Self-pay | Admitting: Cardiology

## 2018-10-28 VITALS — BP 118/76 | HR 71 | Ht 67.0 in | Wt 241.4 lb

## 2018-10-28 DIAGNOSIS — I2510 Atherosclerotic heart disease of native coronary artery without angina pectoris: Secondary | ICD-10-CM | POA: Diagnosis not present

## 2018-10-28 DIAGNOSIS — I319 Disease of pericardium, unspecified: Secondary | ICD-10-CM

## 2018-10-28 DIAGNOSIS — E785 Hyperlipidemia, unspecified: Secondary | ICD-10-CM | POA: Diagnosis not present

## 2018-10-28 DIAGNOSIS — I10 Essential (primary) hypertension: Secondary | ICD-10-CM

## 2018-10-28 DIAGNOSIS — I480 Paroxysmal atrial fibrillation: Secondary | ICD-10-CM

## 2018-10-28 DIAGNOSIS — Z9861 Coronary angioplasty status: Secondary | ICD-10-CM

## 2018-10-28 DIAGNOSIS — I4891 Unspecified atrial fibrillation: Secondary | ICD-10-CM

## 2018-10-28 NOTE — Patient Instructions (Signed)
Medication Instructions:   Your physician recommends that you continue on your current medications as directed. Please refer to the Current Medication list given to you today.  If you need a refill on your cardiac medications before your next appointment, please call your pharmacy.     Testing/Procedures:  Your physician has requested that you have an echocardiogram. Echocardiography is a painless test that uses sound waves to create images of your heart. It provides your doctor with information about the size and shape of your heart and how well your heart's chambers and valves are working. This procedure takes approximately one hour. There are no restrictions for this procedure.     Follow-Up: At [facility], you and your health needs are our priority.  As part of our continuing mission to provide you with exceptional heart care, we have created designated Provider Care Teams.  These Care Teams include your primary Cardiologist (physician) and Advanced Practice Providers (APPs -  Physician Assistants and Nurse Practitioners) who all work together to provide you with the care you need, when you need it. You will need a follow up appointment in 6 months.  Please call our office 2 months in advance to schedule this appointment.  You may see Ena Dawley, MD or one of the following Advanced Practice Providers on your designated Care Team:   [place], PA-C Melina Copa, PA-C . Ermalinda Barrios, PA-C

## 2018-10-28 NOTE — Progress Notes (Signed)
10/28/2018 [name]   12/30/1955  [id number]  Primary Physician Glendale Chard, MD Primary Cardiologist: Dr. [name]   Reason for Visit/CC: 6 months follow-up  HPI:   [name], is a 63 y.o.femalewith a history of CAD s/p DES to [facility] and mLAD 07/11/15 (patent stents on repeat cath 11/2016), chronic pericardial effusion, h/o chronic pericarditis with chronic pericardial rub treated intermittently with steroids and colchicine, PAF on Eliquis, chronic diastolic CHF, DM, HTN, remote h/o treated non-small cell lung cancer, pulmonary fibrosis, aortic insufficiency, COPD and anemia.  10/28/2018 -this is a one-year follow-up, the patient reports some chest pain after carrying heavy boxes last week, otherwise no pain when she lays in bed or on exertion.  She gets minimal lower extremity edema that is intermittent, no orthopnea proximal nocturnal dyspnea.  No palpitation dizziness or syncope.  She tolerates her medication well.  Her only complaint is hoarseness and mild bleeding when she brushes her teeth.  Her last hemoglobin was 10.7.  Current Meds  Medication Sig  . co-enzyme Q-10 50 MG capsule TAKE 1 CAPSULE (50 MG TOTAL) BY MOUTH DAILY.  . cyclobenzaprine (FLEXERIL) 10 MG tablet Take 1 tablet (10 mg total) by mouth at bedtime as needed for muscle spasms (back pain).  [name] diltiazem (CARDIZEM CD) 240 MG 24 hr capsule TAKE 1 CAPSULE BY MOUTH EVERY DAY  . ELIQUIS 5 MG TABS tablet TAKE 1 TABLET BY MOUTH TWICE A DAY  . furosemide (LASIX) 40 MG tablet TAKE 1 TABLET BY MOUTH EVERY DAY  . HUMALOG MIX 75/25 KWIKPEN (75-25) 100 UNIT/ML Kwikpen Inject 30-40 Units into the skin See admin instructions. Inject 40 units SQ in Am and 30 units SQ in PM  . levothyroxine (SYNTHROID, LEVOTHROID) 50 MCG tablet Take 50 mcg by mouth daily before breakfast.   . losartan (COZAAR) 25 MG tablet TAKE 1 TABLET (25 MG TOTAL) BY MOUTH DAILY.  . metoprolol tartrate (LOPRESSOR) 25 MG tablet TAKE 1 TABLET (25 MG TOTAL) BY MOUTH 2  (TWO) TIMES DAILY.  . nitroGLYCERIN (NITROSTAT) 0.4 MG SL tablet Place 1 tablet (0.4 mg total) under the tongue every 5 (five) minutes as needed for chest pain.  . pravastatin (PRAVACHOL) 20 MG tablet TAKE 1 TABLET BY MOUTH EVERY DAY IN THE EVENING  . spironolactone (ALDACTONE) 25 MG tablet Take 1 tablet (25 mg total) by mouth daily. Please make yearly appt with Dr. [name] for March for future refills. 1st attempt  . Vitamin D, Ergocalciferol, (DRISDOL) 50000 units CAPS capsule TAKE 1 CAPSULE BY MOUTH ON TUESDAY AND FRIDAY   Allergies  Allergen Reactions  . Hydrocodone Hives and Itching  . Hydrocodone-Acetaminophen Hives  . Morphine And Related Other (See Comments)    GI PROBLEMS  . Rosuvastatin Other (See Comments)    Pt reports causes lower extremity muscle aches/cramping  . Lisinopril Cough  . Nsaids Other (See Comments)    GI PROBLEMS   Past Medical History:  Diagnosis Date  . Allergy   . Anemia   . Anemia   . Aortic insufficiency    a. Mod by echo 05/2015.  . Arthritis    "knees" (07/11/2015)  . Atrial fibrillation (HCC)    On Eliquis  . 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 Complication of anesthesia    "I had a hard time waking me up w/one of my shoulder surgeries"  . Coronary artery  disease    a. Abnormal stress test -> LHc 06/2015 s/p DES to mCX and mLAD, residual D2 disease treated medially.  . Diverticulitis   . Elevated blood pressure   . Erythema nodosum   . Heart murmur   . Insomnia   . lung ca dx'd 10/1999   chemo/xrt comp 05/29/2000  . Myalgia   . Nephrolithiasis 2015  . Obesity   . Pericardial effusion    a. 05/2015 Echo: EF 55-60%, no rwma, Gr 1 DD, no effusion - but an effusion was seen on CT which was felt to be increased in size compared to 12/2014 - pericardium also thickened.   . Pericarditis    a. Dx 05/2015.  [name] Pneumothorax,  spontaneous, tension 01/20/2012   October, 2010  . Pulmonary fibrosis ([place]) 01/20/2012   Due to previous surgery and chest radiation for lung cancer  . Restrictive lung disease   . Type II diabetes mellitus (HCC)    Family History  Problem Relation Age of Onset  . Heart disease Father   . Diabetes Father   . Pancreatic cancer Mother   . Diabetes Mother   . Hypertension Sister        x 3  . Diabetes Sister        x 4  . Diabetes Maternal Grandmother   . Diabetes Paternal Grandmother   . Diabetes Sister   . Diabetes Sister   . Diabetes Sister   . Kidney disease Sister   . Colon cancer Neg Hx   . Colon polyps Neg Hx   . Rectal cancer Neg Hx   . Stomach cancer Neg Hx   . Esophageal cancer Neg Hx   . Breast cancer Neg Hx    Past Surgical History:  Procedure Laterality Date  . ABDOMINAL HYSTERECTOMY  2008  . APPENDECTOMY  1969?  [name] CARDIAC CATHETERIZATION N/A 07/11/2015   Procedure: Left Heart Cath and Coronary Angiography;  Surgeon: Jettie Booze, MD;  Location: [place] [facility];  Service: Cardiovascular;  Laterality: N/A;  . CARDIAC CATHETERIZATION  07/11/2015   Procedure: Coronary Stent Intervention;  Surgeon: Jettie Booze, MD;  Location: [place] [facility];  Service: Cardiovascular;;  . CARPAL TUNNEL RELEASE Bilateral 1998-2005?   right-left  . CARPAL TUNNEL RELEASE Left 2009?   "for the 2nd time"  . COLONOSCOPY    . COLONOSCOPY WITH PROPOFOL N/A 05/28/2018   Procedure: COLONOSCOPY WITH PROPOFOL;  Surgeon: Milus Banister, MD;  Location: [facility];  Service: Endoscopy;  Laterality: N/A;  . CORONARY ANGIOPLASTY    . CYSTOSCOPY W/ STONE MANIPULATION  2013  . ESOPHAGOGASTRODUODENOSCOPY (EGD) WITH ESOPHAGEAL DILATION  2012  . FOOT SURGERY Bilateral 1980's   Callous removed   . INNER EAR SURGERY Right ~ 2009  . KNEE ARTHROPLASTY Right 1991  . KNEE ARTHROSCOPY  ~ 2000   LATERAL RELEASE  . LEFT HEART CATH AND CORONARY ANGIOGRAPHY N/A 12/18/2016   Procedure: Left  Heart Cath and Coronary Angiography;  Surgeon: Burnell Blanks, MD;  Location: [place] [facility];  Service: Cardiovascular;  Laterality: N/A;  . LUNG CANCER SURGERY Left 2001   "small cell"  . LUNG SURGERY Right 2007   "reinflated it"  . PULMONARY EMBOLISM SURGERY  2007   LUNG COLLAPSE  . RIGHT AND LEFT HEART CATH N/A 06/25/2017   Procedure: RIGHT AND LEFT HEART CATH;  Surgeon: Sherren Mocha, MD;  Location: [place] [facility];  Service: Cardiovascular;  Laterality: N/A;  . SHOULDER  ADHESION RELEASE Right ~ 2004  . SHOULDER ARTHROSCOPY W/ ROTATOR CUFF REPAIR Right ~ 2003   Social History   Socioeconomic History  . Marital status: Single    Spouse name: Not on file  . Number of children: 0  . Years of education: Not on file  . Highest education level: Not on file  Occupational History  . Occupation: RETIRED  Social Needs  . Financial resource strain: Not hard at all  . Food insecurity:    Worry: Never true    Inability: Never true  . Transportation needs:    Medical: No    Non-medical: No  Tobacco Use  . Smoking status: Former Smoker    Packs/day: 0.25    Years: 5.00    Pack years: 1.25    Types: Cigarettes    Last attempt to quit: 07/22/1999    Years since quitting: 19.2  . Smokeless tobacco: Never Used  Substance and Sexual Activity  . Alcohol use: No    Alcohol/week: 0.0 standard drinks  . Drug use: No  . Sexual activity: Not Currently    Birth control/protection: Other-see comments    Comment: hysterectomy  Lifestyle  . Physical activity:    Days per week: 0 days    Minutes per session: 0 min  . Stress: Not at all  Relationships  . Social connections:    Talks on phone: Not on file    Gets together: Not on file    Attends religious service: Not on file    Active member of club or organization: Not on file    Attends meetings of clubs or organizations: Not on file    Relationship status: Not on file  . Intimate partner violence:    Fear of current  or ex partner: No    Emotionally abused: No    Physically abused: No    Forced sexual activity: No  Other Topics Concern  . Not on file  Social History Narrative  . Not on file     Review of Systems: General: negative for chills, fever, night sweats or weight changes.  Cardiovascular: negative for chest pain, dyspnea on exertion, edema, orthopnea, palpitations, paroxysmal nocturnal dyspnea or shortness of breath Dermatological: negative for rash Respiratory: negative for cough or wheezing Urologic: negative for hematuria Abdominal: negative for nausea, vomiting, diarrhea, bright red blood per rectum, melena, or hematemesis Neurologic: negative for visual changes, syncope, or dizziness All other systems reviewed and are otherwise negative except as noted above.   Physical Exam:  Blood pressure 118/76, pulse 71, height 5\' 7"  (1.702 m), weight 241 lb 6.4 oz (109.5 kg), SpO2 90 %.  General appearance: alert, cooperative and no distress Neck: no carotid bruit, no JVD and supple, symmetrical, trachea midline Lungs: clear to auscultation bilaterally Heart: regular rate and rhythm, S1, S2 normal, out of 6 diastolic murmur, significant systolic and diastolic pericardial rub. Extremities: extremities normal, atraumatic, no cyanosis or edema Pulses: 2+ and symmetric Skin: Skin color, texture, turgor normal. No rashes or lesions Neurologic: Grossly normal  EKG not preformed -- personally reviewed    ASSESSMENT AND PLAN:   1. Chronic Diastolic CHF: Symptoms of lower extremity edema and dyspnea on exertion have improved on higher dose of Lasix, at 40 mg twice daily.    2.  Aortic insufficiency: Echo in March 2019 showed only mild AI.  3.  Chronic pericarditis with chronic significant rub, however no significant symptoms.  We will repeat echocardiogram now.  4.  HTN: Controlled  on current regimen.  5. CAD: h/o DES to [facility] and mLAD 07/11/15 stable without angina no restenosis on cath in  11/2016. No recent CP.   6. PAF: RRR on exam. HR is controlled. She is on Eliquis for a/c. Denies palpitations.  Has mild blood-tinged saliva after toothbrushing and hoarseness, she is advised to follow with her ENT doctor.  Follow-Up w/ Dr. [name] in 6 months.   Ena Dawley, MD [facility] 10/28/2018 10:20 AM

## 2018-11-02 ENCOUNTER — Ambulatory Visit (HOSPITAL_COMMUNITY): Payer: Medicare Other | Attending: Cardiovascular Disease

## 2018-11-02 ENCOUNTER — Other Ambulatory Visit: Payer: Self-pay | Admitting: Internal Medicine

## 2018-11-02 ENCOUNTER — Other Ambulatory Visit: Payer: Self-pay

## 2018-11-02 DIAGNOSIS — E785 Hyperlipidemia, unspecified: Secondary | ICD-10-CM | POA: Diagnosis present

## 2018-11-02 DIAGNOSIS — I10 Essential (primary) hypertension: Secondary | ICD-10-CM

## 2018-11-02 DIAGNOSIS — I319 Disease of pericardium, unspecified: Secondary | ICD-10-CM | POA: Insufficient documentation

## 2018-11-02 DIAGNOSIS — Z1231 Encounter for screening mammogram for malignant neoplasm of breast: Secondary | ICD-10-CM

## 2018-11-03 ENCOUNTER — Telehealth: Payer: Self-pay | Admitting: *Deleted

## 2018-11-03 NOTE — Telephone Encounter (Signed)
-----   Message from Nuala Alpha, LPN sent at 9/76/7341  8:19 AM EST -----  ----- Message ----- From: Dorothy Spark, MD Sent: 11/02/2018   5:57 PM EST To: Nuala Alpha, LPN  Stable, normal LVEF, aortic insufficiency is now moderate.

## 2018-11-03 NOTE — Telephone Encounter (Signed)
Pt called back and has been notified of echo results by phone with verbal understanding. Pt has recall for Dr. [name] 04/2019. The patient has been notified of the result and verbalized understanding.  All questions (if any) were answered. Julaine Hua, CMA 11/03/2018 9:30 AM

## 2018-11-03 NOTE — Telephone Encounter (Signed)
Left message for the pt to call back to go over echo results.

## 2018-11-13 ENCOUNTER — Other Ambulatory Visit: Payer: Self-pay | Admitting: Cardiology

## 2018-11-30 IMAGING — CT CT CHEST W/ CM
2 of 3 series · 15 of 36 positions shown, 18 images · IV contrast (ISOVUE 300)
Comparison: Chest radiographs dated 10/04/2016. CTA chest dated
05/30/2015.

CLINICAL DATA: Hoarseness x 2-3 months, history of small cell lung
cancer

EXAM:
CT CHEST WITH CONTRAST
TECHNIQUE: Multidetector CT imaging of the chest was performed during
intravenous contrast administration.
CONTRAST:  80mL CTPA02-ZLL IOPAMIDOL (CTPA02-ZLL) INJECTION 61%

[Series 2: thorax · axial · 0.72mm/px · z∈[-304,-74]mm · 12 of 136 slices shown, 15 images]
[im 11/136  mediastinal]
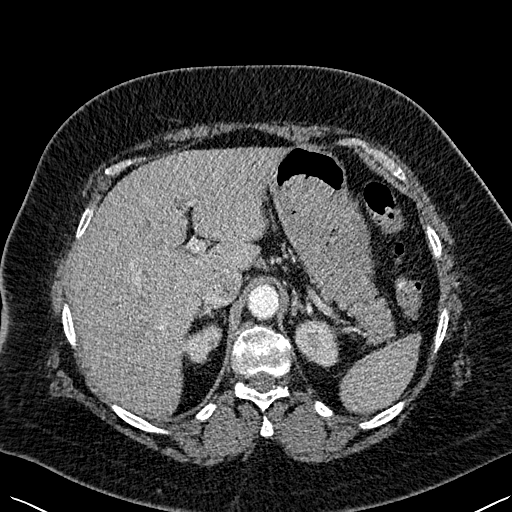
[im 11/136  lung]
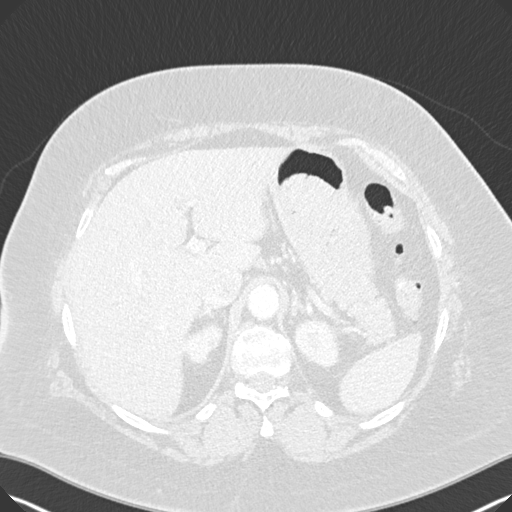
[im 21/136  lung]
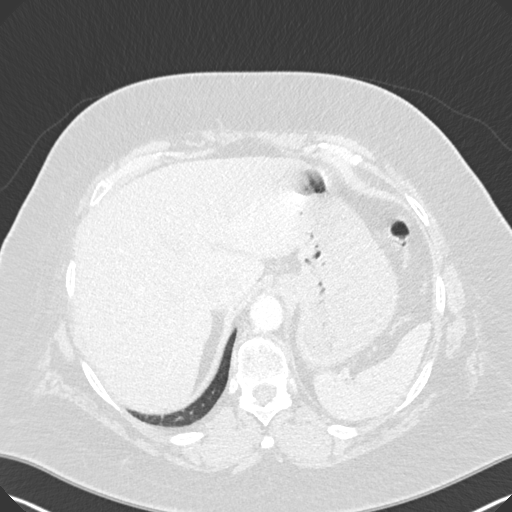
[im 31/136  lung]
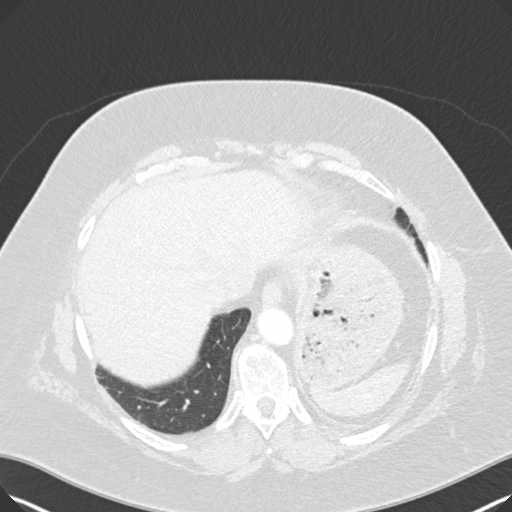
[im 41/136  lung]
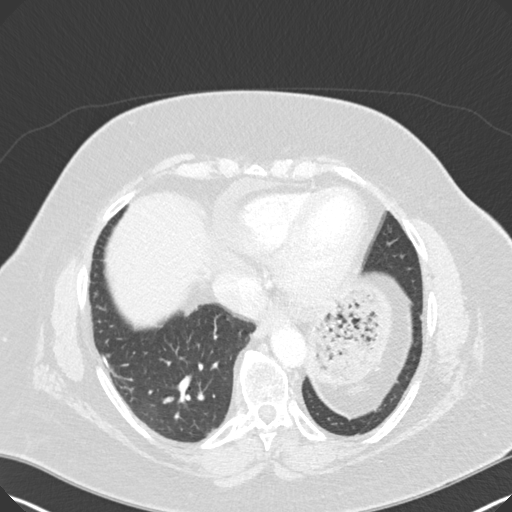
[im 51/136  mediastinal]
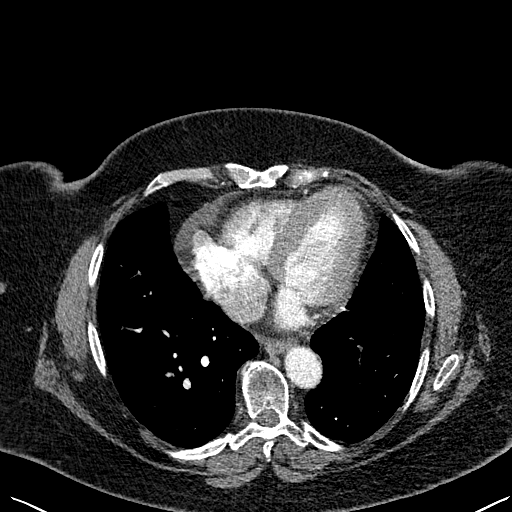
[im 51/136  lung]
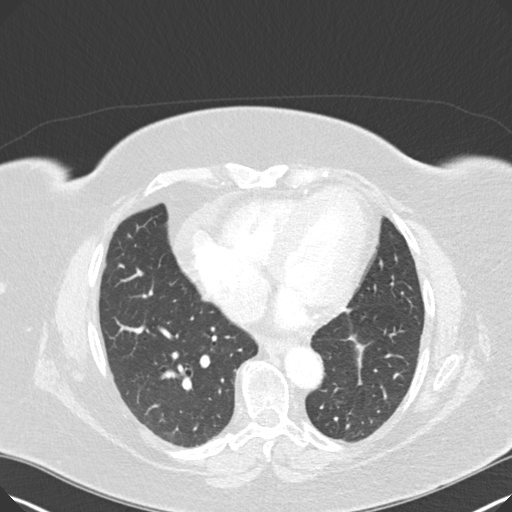
[im 61/136  lung]
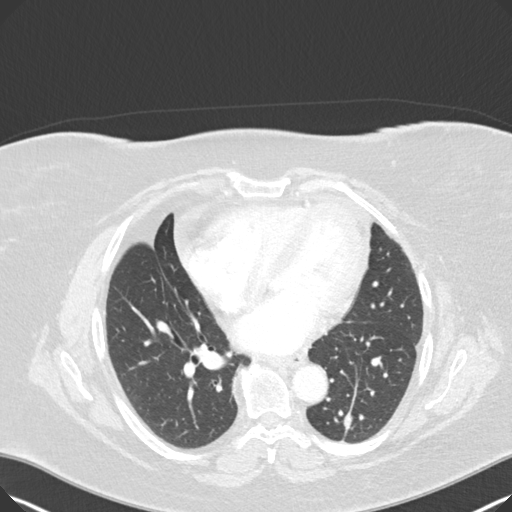
[im 76/136  lung]
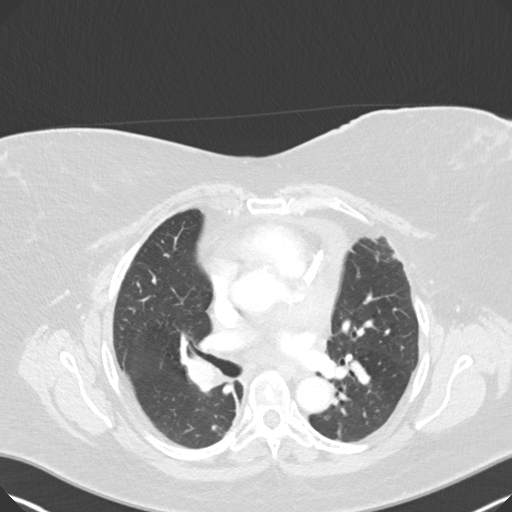
[im 86/136  lung]
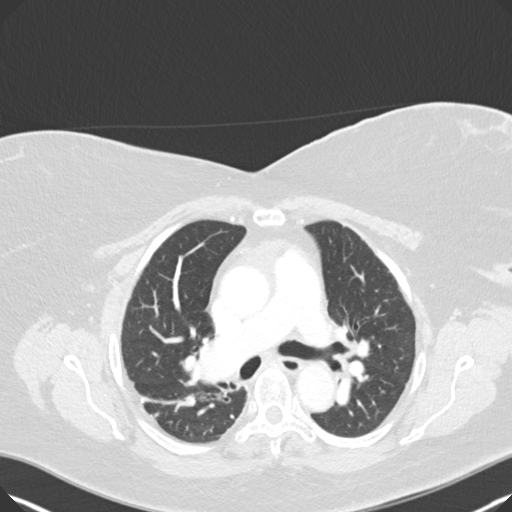
[im 96/136  mediastinal]
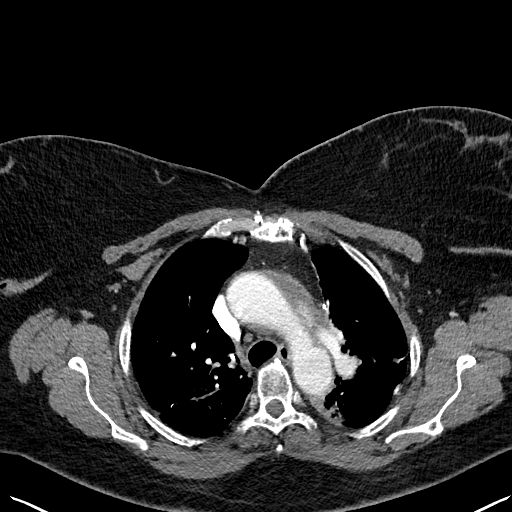
[im 96/136  lung]
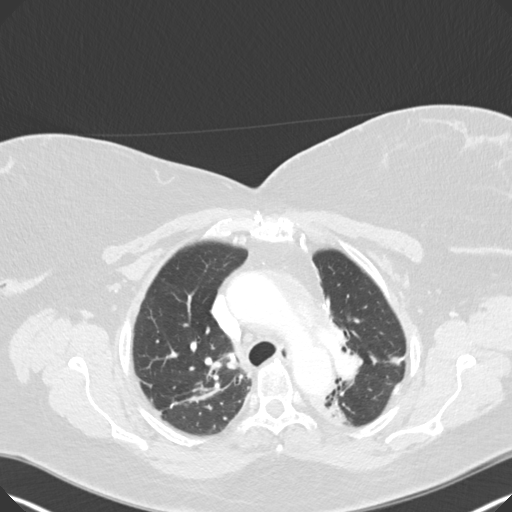
[im 106/136  lung]
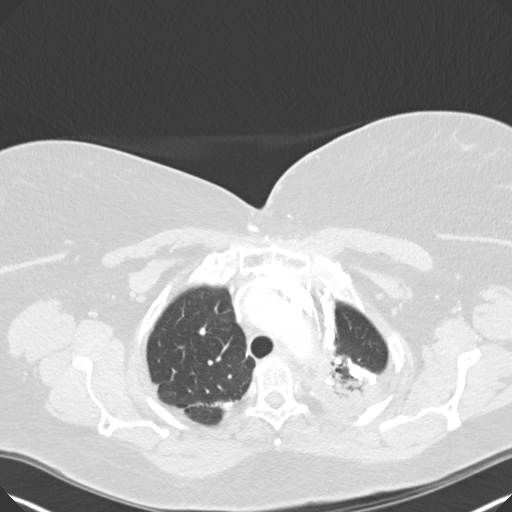
[im 116/136  lung]
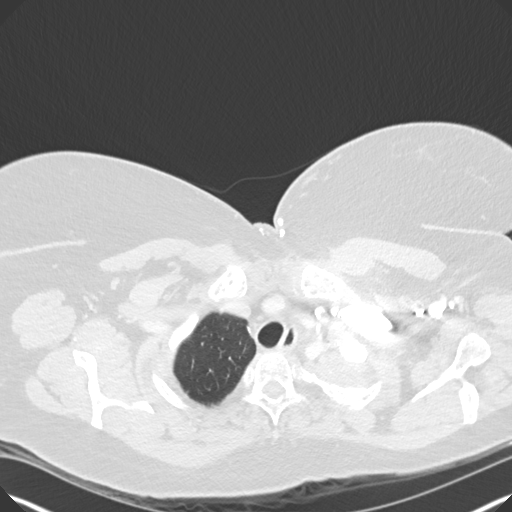
[im 126/136  lung]
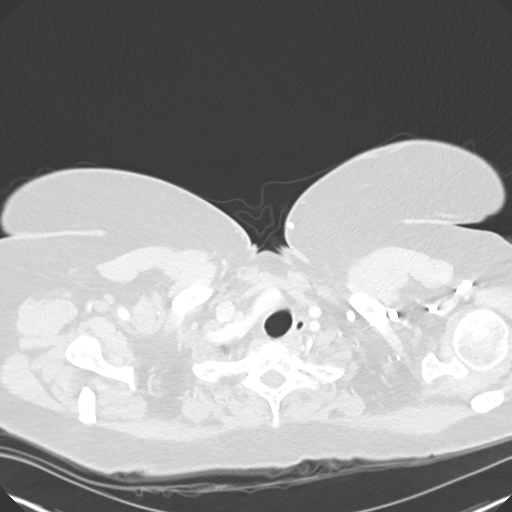

[Series 5: coronal · coronal · 0.52mm/px · 3 of 113 slices shown]
[im 23/113  lung]
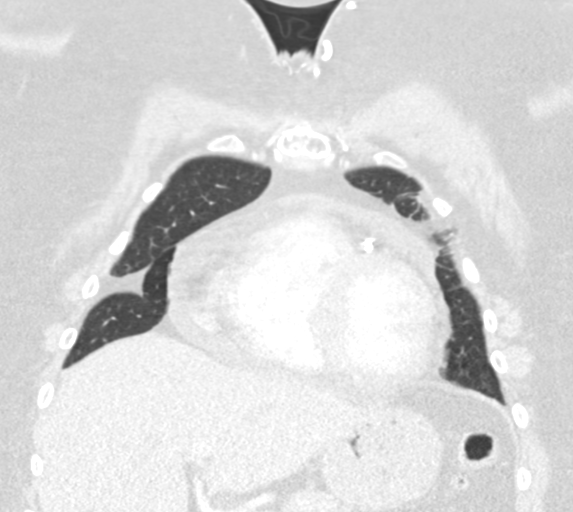
[im 45/113  lung]
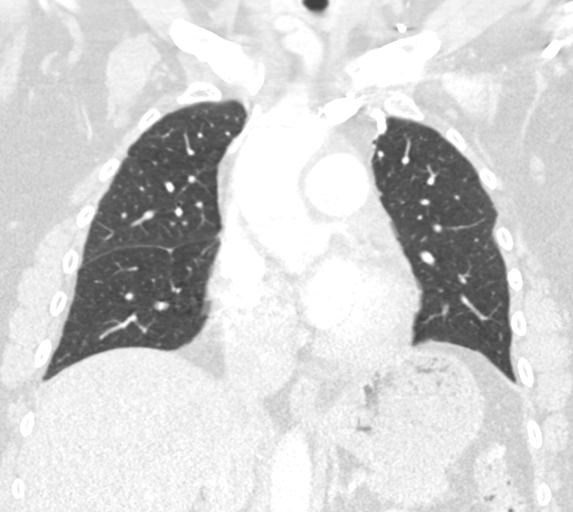
[im 68/113  lung]
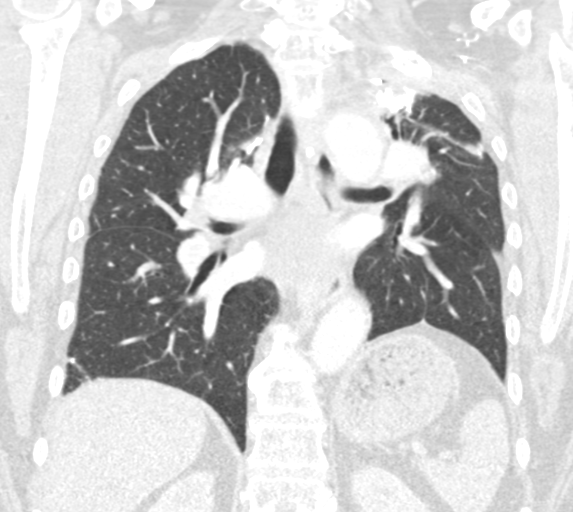

[15 of 36 positions shown; findings below may reference images not displayed]

FINDINGS: Cardiovascular: Heart is top-normal in size. Small pericardial
effusion.

Coronary atherosclerosis in the LAD and left circumflex with
coronary stent in the LAD.

No evidence of thoracic aortic aneurysm. Mild atherosclerotic
calcifications.

Mediastinum/Nodes: No suspicious mediastinal lymphadenopathy.

Visualized thyroid is unremarkable.

Lungs/Pleura: Surgical clips with radiation changes in the posterior
left lung apex. Associated volume loss. This appearance is unchanged
from 3568.

Additional postsurgical changes in the right lung related to prior
lobectomy versus upper lung wedge resection.

Mild subpleural scarring anteriorly in the left upper lobe/ lingula.
Mild linear scarring in the left lower lobe.

No focal consolidation.

No suspicious pulmonary nodules.

No pleural effusion or pneumothorax.

Upper Abdomen: Visualize upper abdomen is unremarkable.

Musculoskeletal: Degenerative changes of the visualized
thoracolumbar spine.
IMPRESSION: Postsurgical changes in the lungs bilaterally. Radiation changes in
the left lung apex.

No evidence of recurrent or metastatic disease.

## 2018-12-02 ENCOUNTER — Ambulatory Visit
Admission: RE | Admit: 2018-12-02 | Discharge: 2018-12-02 | Disposition: A | Discharge status: Home / Self Care | Payer: Medicare Other | Source: Ambulatory Visit | Attending: Internal Medicine | Admitting: Internal Medicine

## 2018-12-02 ENCOUNTER — Other Ambulatory Visit: Payer: Self-pay | Admitting: Cardiology

## 2018-12-02 DIAGNOSIS — Z1231 Encounter for screening mammogram for malignant neoplasm of breast: Secondary | ICD-10-CM

## 2018-12-02 NOTE — Telephone Encounter (Signed)
Eliquis 5mg  refill request received; pt is 63 yrs old, wt-109.5kg, Crea-0.94 on 09/23/2018, last seen by Dr. [name] on 10/28/2018; will send in refill to requested pharmacy.

## 2018-12-23 ENCOUNTER — Ambulatory Visit: Payer: Medicare Other | Admitting: Internal Medicine

## 2018-12-23 ENCOUNTER — Encounter: Payer: Self-pay | Admitting: Internal Medicine

## 2018-12-23 VITALS — BP 110/68 | HR 77 | Temp 97.8°F | Ht 67.0 in | Wt 237.8 lb

## 2018-12-23 DIAGNOSIS — M545 Low back pain: Secondary | ICD-10-CM

## 2018-12-23 DIAGNOSIS — E1122 Type 2 diabetes mellitus with diabetic chronic kidney disease: Secondary | ICD-10-CM

## 2018-12-23 DIAGNOSIS — I1310 Hypertensive heart and chronic kidney disease without heart failure, with stage 1 through stage 4 chronic kidney disease, or unspecified chronic kidney disease: Secondary | ICD-10-CM

## 2018-12-23 DIAGNOSIS — M25552 Pain in left hip: Secondary | ICD-10-CM

## 2018-12-23 DIAGNOSIS — Z794 Long term (current) use of insulin: Secondary | ICD-10-CM

## 2018-12-23 DIAGNOSIS — G8929 Other chronic pain: Secondary | ICD-10-CM

## 2018-12-23 DIAGNOSIS — I482 Chronic atrial fibrillation, unspecified: Secondary | ICD-10-CM

## 2018-12-23 DIAGNOSIS — E039 Hypothyroidism, unspecified: Secondary | ICD-10-CM

## 2018-12-23 DIAGNOSIS — N183 Chronic kidney disease, stage 3 (moderate): Secondary | ICD-10-CM

## 2018-12-23 DIAGNOSIS — Z6837 Body mass index (BMI) 37.0-37.9, adult: Secondary | ICD-10-CM

## 2018-12-23 MED ORDER — METHOCARBAMOL 500 MG PO TABS: 500.0000 mg | ORAL_TABLET | Freq: Three times a day (TID) | ORAL | 0 refills | Status: DC | PRN

## 2018-12-23 NOTE — Patient Instructions (Signed)
Muscle Strain A muscle strain is an injury that happens when a muscle is stretched longer than normal. This can happen during a fall, sports, or lifting. This can tear some muscle fibers. Usually, recovery from muscle strain takes 1-2 weeks. Complete healing normally takes 5-6 weeks. This condition is first treated with PRICE therapy. This involves:  Protecting your muscle from being injured again.  Resting your injured muscle.  Icing your injured muscle.  Applying pressure (compression) to your injured muscle. This may be done with a splint or elastic bandage.  Raising (elevating) your injured muscle. Your doctor may also recommend medicine for pain. Follow these instructions at home: If you have a splint:  Wear the splint as told by your doctor. Take it off only as told by your doctor.  Loosen the splint if your fingers or toes tingle, get numb, or turn cold and blue.  Keep the splint clean.  If the splint is not waterproof: ? Do not let it get wet. ? Cover it with a watertight covering when you take a bath or a shower. Managing pain, stiffness, and swelling   If directed, put ice on your injured area. ? If you have a removable splint, take it off as told by your doctor. ? Put ice in a plastic bag. ? Place a towel between your skin and the bag. ? Leave the ice on for 20 minutes, 2-3 times a day.  Move your fingers or toes often. This helps to avoid stiffness and lessen swelling.  Raise your injured area above the level of your heart while you are sitting or lying down.  Wear an elastic bandage as told by your doctor. Make sure it is not too tight. General instructions  Take over-the-counter and prescription medicines only as told by your doctor.  Limit your activity. Rest your injured muscle as told by your doctor. Your doctor may say that gentle movements are okay.  If physical therapy was prescribed, do exercises as told by your doctor.  Do not put pressure on any  part of the splint until it is fully hardened. This may take many hours.  Do not use any products that contain nicotine or tobacco, such as cigarettes and e-cigarettes. These can delay bone healing. If you need help quitting, ask your doctor.  Warm up before you exercise. This helps to prevent more muscle strains.  Ask your doctor when it is safe to drive if you have a splint.  Keep all follow-up visits as told by your doctor. This is important. Contact a doctor if:  You have more pain or swelling in your injured area. Get help right away if:  You have any of these problems in your injured area: ? You have numbness. ? You have tingling. ? You lose a lot of strength. Summary  A muscle strain is an injury that happens when a muscle is stretched longer than normal.  This condition is first treated with PRICE therapy. This includes protecting, resting, icing, adding pressure, and raising your injury.  Limit your activity. Rest your injured muscle as told by your doctor. Your doctor may say that gentle movements are okay.  Warm up before you exercise. This helps to prevent more muscle strains. This information is not intended to replace advice given to you by your health care provider. Make sure you discuss any questions you have with your health care provider. Document Released: 07/16/2008 Document Revised: 11/13/2016 Document Reviewed: 11/13/2016 Elsevier Interactive Patient Education  2019 Elsevier  Inc.  

## 2018-12-23 NOTE — Progress Notes (Addendum)
Subjective:     Patient ID: [name] , female    DOB: [birth year] , 63 y.o.   MRN: [id number]   Chief Complaint  Patient presents with  . Diabetes  . Hypertension    HPI  She is here today for f/u dm check. She was started on Ozempic Nov 2019. [name] She has not had any issues with the medication.   Diabetes  She presents for her follow-up diabetic visit. She has type 2 diabetes mellitus. Her disease course has been stable. There are no hypoglycemic associated symptoms. Pertinent negatives for diabetes include no blurred vision and no chest pain. There are no hypoglycemic complications. Symptoms are stable. Diabetic complications include nephropathy. Risk factors for coronary artery disease include diabetes mellitus, dyslipidemia, hypertension, sedentary lifestyle, post-menopausal and obesity.  Hypertension  This is a chronic problem. The current episode started more than 1 year ago. The problem has been gradually improving since onset. The problem is controlled. Pertinent negatives include no blurred vision, chest pain, palpitations or shortness of breath.  She reports compliance with meds.    Past Medical History:  Diagnosis Date  . Allergy   . Anemia   . Anemia   . Aortic insufficiency    a. Mod by echo 05/2015.  . Arthritis    "knees" (07/11/2015)  . Atrial fibrillation (HCC)    On Eliquis  . 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 Complication of anesthesia    "I had a hard time waking me up w/one of my shoulder surgeries"  . Coronary artery disease    a. Abnormal stress test -> LHc 06/2015 s/p DES to mCX and mLAD, residual D2 disease treated medially.  . Diverticulitis   . Elevated blood pressure   . Erythema nodosum   . Heart murmur   . Insomnia   . lung ca dx'd 10/1999   chemo/xrt comp 05/29/2000  . Myalgia   . Nephrolithiasis 2015  . Obesity   .  Pericardial effusion    a. 05/2015 Echo: EF 55-60%, no rwma, Gr 1 DD, no effusion - but an effusion was seen on CT which was felt to be increased in size compared to 12/2014 - pericardium also thickened.   . Pericarditis    a. Dx 05/2015.  [name] Pneumothorax, spontaneous, tension 01/20/2012   October, 2010  . Pulmonary fibrosis ([place]) 01/20/2012   Due to previous surgery and chest radiation for lung cancer  . Restrictive lung disease   . Type II diabetes mellitus (HCC)      Family History  Problem Relation Age of Onset  . Heart disease Father   . Diabetes Father   . Pancreatic cancer Mother   . Diabetes Mother   . Hypertension Sister        x 3  . Diabetes Sister        x 4  . Diabetes Maternal Grandmother   . Diabetes Paternal Grandmother   . Diabetes Sister   . Diabetes Sister   . Diabetes Sister   . Kidney disease Sister   . Colon cancer Neg Hx   . Colon polyps Neg Hx   . Rectal cancer Neg Hx   . Stomach cancer Neg Hx   . Esophageal cancer Neg Hx   . Breast cancer Neg Hx      Current Outpatient Medications:  .  co-enzyme Q-10 50 MG  capsule, TAKE 1 CAPSULE (50 MG TOTAL) BY MOUTH DAILY., Disp: 90 capsule, Rfl: 1 .  diltiazem (CARDIZEM CD) 240 MG 24 hr capsule, TAKE 1 CAPSULE BY MOUTH EVERY DAY, Disp: 90 capsule, Rfl: 2 .  ELIQUIS 5 MG TABS tablet, TAKE 1 TABLET BY MOUTH TWICE A DAY, Disp: 180 tablet, Rfl: 2 .  furosemide (LASIX) 40 MG tablet, TAKE 1 TABLET BY MOUTH EVERY DAY, Disp: 90 tablet, Rfl: 3 .  HUMALOG MIX 75/25 KWIKPEN (75-25) 100 UNIT/ML Kwikpen, Inject 30-40 Units into the skin See admin instructions. Inject 40 units SQ in Am and 30 units SQ in PM, Disp: , Rfl: 6 .  levothyroxine (SYNTHROID, LEVOTHROID) 50 MCG tablet, Take 50 mcg by mouth daily before breakfast. , Disp: , Rfl: 6 .  losartan (COZAAR) 25 MG tablet, TAKE 1 TABLET (25 MG TOTAL) BY MOUTH DAILY., Disp: 90 tablet, Rfl: 3 .  metoprolol tartrate (LOPRESSOR) 25 MG tablet, TAKE 1 TABLET (25 MG TOTAL) BY MOUTH 2  (TWO) TIMES DAILY., Disp: 180 tablet, Rfl: 1 .  nitroGLYCERIN (NITROSTAT) 0.4 MG SL tablet, Place 1 tablet (0.4 mg total) under the tongue every 5 (five) minutes as needed for chest pain., Disp: 25 tablet, Rfl: 3 .  pravastatin (PRAVACHOL) 20 MG tablet, TAKE 1 TABLET BY MOUTH EVERY DAY IN THE EVENING, Disp: 90 tablet, Rfl: 3 .  Semaglutide,0.25 or 0.5MG/DOS, (OZEMPIC, 0.25 OR 0.5 MG/DOSE,) 2 MG/1.5ML SOPN, Inject into the skin., Disp: , Rfl:  .  spironolactone (ALDACTONE) 25 MG tablet, Take 1 tablet (25 mg total) by mouth daily. Please make yearly appt with Dr. [name] for March for future refills. 1st attempt, Disp: 90 tablet, Rfl: 0 .  methocarbamol (ROBAXIN) 500 MG tablet, Take 1 tablet (500 mg total) by mouth every 8 (eight) hours as needed for muscle spasms., Disp: 60 tablet, Rfl: 0 .  Vitamin D, Ergocalciferol, (DRISDOL) 50000 units CAPS capsule, TAKE 1 CAPSULE BY MOUTH ON TUESDAY AND FRIDAY (Patient not taking: Reported on 12/23/2018), Disp: 8 capsule, Rfl: 0   Allergies  Allergen Reactions  . Hydrocodone Hives and Itching  . Hydrocodone-Acetaminophen Hives  . Morphine And Related Other (See Comments)    GI PROBLEMS  . Rosuvastatin Other (See Comments)    Pt reports causes lower extremity muscle aches/cramping  . Lisinopril Cough  . Nsaids Other (See Comments)    GI PROBLEMS     Review of Systems  Constitutional: Negative.   Eyes: Negative for blurred vision.  Respiratory: Negative.  Negative for shortness of breath.   Cardiovascular: Negative.  Negative for chest pain and palpitations.  Gastrointestinal: Negative.   Musculoskeletal: Positive for arthralgias.  Neurological: Negative.   Psychiatric/Behavioral: Negative.      Today's Vitals   12/23/18 1451  BP: 110/68  Pulse: 77  Temp: 97.8 F (36.6 C)  TempSrc: Oral  Weight: 237 lb 12.8 oz (107.9 kg)  Height: '5\' 7"'  (1.702 m)   Body mass index is 37.24 kg/m.   Objective:  Physical Exam Vitals signs and nursing note  reviewed.  Constitutional:      Appearance: Normal appearance.  HENT:     Head: Normocephalic and atraumatic.  Cardiovascular:     Rate and Rhythm: Normal rate and regular rhythm.     Heart sounds: Murmur present.  Pulmonary:     Effort: Pulmonary effort is normal.     Breath sounds: Normal breath sounds.  Skin:    General: Skin is warm.  Neurological:     General: No  focal deficit present.     Mental Status: She is alert.  Psychiatric:        Mood and Affect: Mood normal.        Behavior: Behavior normal.         Assessment And Plan:     1. Type 2 diabetes mellitus with stage 3 chronic kidney disease, with long-term current use of insulin ([place])  I will check labs as listed below.  Importance of dietary, exercise and medication compliance was discussed with the patient. I will be sure to fax her results to Dr. [name], for her review. She has done well with Ozempic thus far. I plan to increase her Ozempic to 97m once weekly in the near future.   - Hemoglobin A1c  2. 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 She will continue with current meds. She is encouraged to avoid adding salt to her foods. She will rto in six months for re-evaluation.   - BMP8+EGFR  3. Atrial fibrillation, chronic   Chronic, yet stable. She is also followed by cardiology.   4. Chronic left-sided low back pain without sciatica  She was given rx Robaxin twice daily to use prn. She will f/u with Ortho as requested.   5. Left hip pain  I will refer her to Ortho for further evaluation. She has been seen by Dr. GBerenice Primasin the past. Pt advised her sx could be due to lumbar spinal disease.   - Ambulatory referral to Orthopedic Surgery  6. Acquired hypothyroidism  I will check thyroid panel and adjust meds as needed. - TSH  7. Class 2 severe obesity due to excess calories with serious comorbidity and body mass index (BMI) of 37.0 to 37.9 in adult  ([facility]  Importance of achieving optimal weight to decrease risk of cardiovascular disease and cancers was discussed with the patient in full detail. She is encouraged to start slowly - start with 10 minutes twice daily at least three to four days per week and to gradually build to 30 minutes five days weekly. She was given tips to incorporate more activity into her daily routine - take stairs when possible, park farther away from her job, grocery stores, etc.      RMaximino Greenland MD

## 2018-12-24 LAB — BMP8+EGFR
BUN/Creatinine Ratio: 20 (ref 12–28)
BUN: 20 mg/dL (ref 8–27)
CO2: 25 mmol/L (ref 20–29)
Calcium: 9.5 mg/dL (ref 8.7–10.3)
Chloride: 102 mmol/L (ref 96–106)
Creatinine, Ser: 0.99 mg/dL (ref 0.57–1.00)
GFR calc Af Amer: 72 mL/min/{1.73_m2} (ref >59)
GFR calc non Af Amer: 62 mL/min/{1.73_m2} (ref >59)
Glucose: 78 mg/dL (ref 65–99)
Potassium: 4.5 mmol/L (ref 3.5–5.2)
Sodium: 143 mmol/L (ref 134–144)

## 2018-12-24 LAB — HEMOGLOBIN A1C
Est. average glucose Bld gHb Est-mCnc: 166 mg/dL
Hgb A1c MFr Bld: 7.4 % — ABNORMAL HIGH (ref 4.8–5.6)

## 2018-12-24 LAB — TSH: TSH: 1.74 u[IU]/mL (ref 0.450–4.500)

## 2018-12-28 ENCOUNTER — Ambulatory Visit: Payer: Medicare Other | Admitting: Pulmonary Disease

## 2018-12-29 ENCOUNTER — Ambulatory Visit (INDEPENDENT_AMBULATORY_CARE_PROVIDER_SITE_OTHER)
Admission: RE | Admit: 2018-12-29 | Discharge: 2018-12-29 | Disposition: A | Discharge status: Home / Self Care | Payer: Medicare Other | Source: Ambulatory Visit | Attending: Pulmonary Disease | Admitting: Pulmonary Disease

## 2018-12-29 ENCOUNTER — Encounter: Payer: Self-pay | Admitting: Pulmonary Disease

## 2018-12-29 ENCOUNTER — Ambulatory Visit: Payer: Medicare Other | Admitting: Pulmonary Disease

## 2018-12-29 VITALS — BP 116/74 | HR 60 | Ht 67.0 in | Wt 235.0 lb

## 2018-12-29 DIAGNOSIS — I311 Chronic constrictive pericarditis: Secondary | ICD-10-CM

## 2018-12-29 DIAGNOSIS — R0602 Shortness of breath: Secondary | ICD-10-CM

## 2018-12-29 DIAGNOSIS — J984 Other disorders of lung: Secondary | ICD-10-CM

## 2018-12-29 NOTE — Assessment & Plan Note (Signed)
Stable dyspnea. Chest x-ray today due to her history of lung cancer. Main issue now going forward is to follow moderate AI

## 2018-12-29 NOTE — Patient Instructions (Signed)
Chest xray today.

## 2018-12-29 NOTE — Progress Notes (Signed)
   Subjective:    Patient ID: [name], female    DOB: 03/29/1956, 63 y.o.   MRN: [id number]  HPI  63 yo never smoker with dyspnea is attributed to restrictive lung disease  PMH -  LUlobectomy for spont pneumothx in 07/2006, s/p chemo/ XRT for stg III B lung cancer in 2001 - in remission since!  - chronic pericarditis since 2016, has received NSAIDs, colchicine and steroids, was referred to [facility]. -CAD -cardiac cath 06/2015 required 2 stents, repeat 06/2017,  moderate aortic regurgitation - EGD 11/2017 which showed mild narrowing of the mid esophagus without mass.  Dyspnea stable over the past year but she still continues to have class I-II dyspnea.  No intercurrent chest colds, chest pain seems to have subsided. Unfortunately echo showed 10/2018 moderate TR and she has follow-up with cardiology in June. She takes Lasix daily sometimes as late as 8 PM but does not sleep until 3 AM  Past Medical History:  Diagnosis Date  . Allergy   . Anemia   . Anemia   . Aortic insufficiency    a. Mod by echo 05/2015.  . Arthritis    "knees" (07/11/2015)  . Atrial fibrillation (HCC)    On Eliquis  . 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 Complication of anesthesia    "I had a hard time waking me up w/one of my shoulder surgeries"  . Coronary artery disease    a. Abnormal stress test -> LHc 06/2015 s/p DES to mCX and mLAD, residual D2 disease treated medially.  . Diverticulitis   . Elevated blood pressure   . Erythema nodosum   . Heart murmur   . Insomnia   . lung ca dx'd 10/1999   chemo/xrt comp 05/29/2000  . Myalgia   . Nephrolithiasis 2015  . Obesity   . Pericardial effusion    a. 05/2015 Echo: EF 55-60%, no rwma, Gr 1 DD, no effusion - but an effusion was seen on CT which was felt to be increased in size compared to 12/2014 - pericardium also thickened.   . Pericarditis    a. Dx 05/2015.  [name] Pneumothorax, spontaneous, tension 01/20/2012   October, 2010  . Pulmonary fibrosis ([place]) 01/20/2012   Due to previous surgery and chest radiation for lung cancer  . Restrictive lung disease   . Type II diabetes mellitus (HCC)       Review of Systems neg for any significant sore throat, dysphagia, itching, sneezing, nasal congestion or excess/ purulent secretions, fever, chills, sweats, unintended wt loss, pleuritic or exertional cp, hempoptysis, orthopnea pnd or change in chronic leg swelling. Also denies presyncope, palpitations, heartburn, abdominal pain, nausea, vomiting, diarrhea or change in bowel or urinary habits, dysuria,hematuria, rash, arthralgias, visual complaints, headache, numbness weakness or ataxia.     Objective:   Physical Exam  Gen. Pleasant, obese, in no distress ENT - no lesions, no post nasal drip Neck: No JVD, no thyromegaly, no carotid bruits Lungs: no use of accessory muscles, no dullness to percussion, decreased without rales or rhonchi  Cardiovascular: Rhythm regular, heart sounds  normal, no murmurs or gallops, 1+ peripheral edema Musculoskeletal: No deformities, no cyanosis or clubbing , no tremors       Assessment & Plan:

## 2018-12-29 NOTE — Assessment & Plan Note (Signed)
In remission.

## 2019-01-01 ENCOUNTER — Encounter: Payer: Self-pay | Admitting: Nurse Practitioner

## 2019-01-01 ENCOUNTER — Other Ambulatory Visit: Payer: Self-pay

## 2019-01-01 ENCOUNTER — Ambulatory Visit: Payer: Medicare Other | Admitting: Nurse Practitioner

## 2019-01-01 VITALS — BP 128/82 | HR 78 | Ht 67.0 in | Wt 236.2 lb

## 2019-01-01 DIAGNOSIS — E1122 Type 2 diabetes mellitus with diabetic chronic kidney disease: Secondary | ICD-10-CM | POA: Diagnosis not present

## 2019-01-01 DIAGNOSIS — N183 Chronic kidney disease, stage 3 (moderate): Secondary | ICD-10-CM | POA: Diagnosis not present

## 2019-01-01 DIAGNOSIS — Z794 Long term (current) use of insulin: Secondary | ICD-10-CM

## 2019-01-01 DIAGNOSIS — L03032 Cellulitis of left toe: Secondary | ICD-10-CM

## 2019-01-01 MED ORDER — CEPHALEXIN 500 MG PO TABS: 500.0000 mg | ORAL_TABLET | Freq: Two times a day (BID) | ORAL | 0 refills | Status: DC

## 2019-01-01 NOTE — Progress Notes (Signed)
Subjective:     Patient ID: [name] , female    DOB: 1956-01-11 , 63 y.o.   MRN: [id number]   Chief Complaint  Patient presents with  . Foot Pain    lt foot on the fourth toe,pain     HPI  She seen Dr. Baird Cancer on Friday and forgot to tell her about it.  Has been present for the last 2 months, the pain has been for the last 3 weeks.  No drainage, negative for fever.  She had her toe surgery done at [place].  She has seen a podiatrist in the past.  She has been putting neosporin.  She noticed after wearing boots that is when she started having problems.  Left 4th toe calloused and darkened.  Foot Pain  This is a new problem. The problem has been gradually worsening. Pertinent negatives include no fatigue, neck pain or numbness. The symptoms are aggravated by walking. Treatments tried: neosporin. The treatment provided no relief.     Past Medical History:  Diagnosis Date  . Allergy   . Anemia   . Anemia   . Aortic insufficiency    a. Mod by echo 05/2015.  . Arthritis    "knees" (07/11/2015)  . Atrial fibrillation (HCC)    On Eliquis  . 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 Complication of anesthesia    "I had a hard time waking me up w/one of my shoulder surgeries"  . Coronary artery disease    a. Abnormal stress test -> LHc 06/2015 s/p DES to mCX and mLAD, residual D2 disease treated medially.  . Diverticulitis   . Elevated blood pressure   . Erythema nodosum   . Heart murmur   . Insomnia   . lung ca dx'd 10/1999   chemo/xrt comp 05/29/2000  . Myalgia   . Nephrolithiasis 2015  . Obesity   . Pericardial effusion    a. 05/2015 Echo: EF 55-60%, no rwma, Gr 1 DD, no effusion - but an effusion was seen on CT which was felt to be increased in size compared to 12/2014 - pericardium also thickened.   . Pericarditis    a. Dx 05/2015.  [name] Pneumothorax,  spontaneous, tension 01/20/2012   October, 2010  . Pulmonary fibrosis ([place]) 01/20/2012   Due to previous surgery and chest radiation for lung cancer  . Restrictive lung disease   . Type II diabetes mellitus (HCC)      Family History  Problem Relation Age of Onset  . Heart disease Father   . Diabetes Father   . Pancreatic cancer Mother   . Diabetes Mother   . Hypertension Sister        x 3  . Diabetes Sister        x 4  . Diabetes Maternal Grandmother   . Diabetes Paternal Grandmother   . Diabetes Sister   . Diabetes Sister   . Diabetes Sister   . Kidney disease Sister   . Colon cancer Neg Hx   . Colon polyps Neg Hx   . Rectal cancer Neg Hx   . Stomach cancer Neg Hx   . Esophageal cancer Neg Hx   . Breast cancer Neg Hx      Current Outpatient Medications:  .  co-enzyme Q-10 50 MG capsule, TAKE 1 CAPSULE (50 MG TOTAL) BY MOUTH DAILY., Disp: 90 capsule,  Rfl: 1 .  diltiazem (CARDIZEM CD) 240 MG 24 hr capsule, TAKE 1 CAPSULE BY MOUTH EVERY DAY, Disp: 90 capsule, Rfl: 2 .  ELIQUIS 5 MG TABS tablet, TAKE 1 TABLET BY MOUTH TWICE A DAY, Disp: 180 tablet, Rfl: 2 .  furosemide (LASIX) 40 MG tablet, TAKE 1 TABLET BY MOUTH EVERY DAY, Disp: 90 tablet, Rfl: 3 .  HUMALOG MIX 75/25 KWIKPEN (75-25) 100 UNIT/ML Kwikpen, Inject 30-40 Units into the skin See admin instructions. Inject 40 units SQ in Am and 30 units SQ in PM, Disp: , Rfl: 6 .  levothyroxine (SYNTHROID, LEVOTHROID) 50 MCG tablet, Take 50 mcg by mouth daily before breakfast. , Disp: , Rfl: 6 .  losartan (COZAAR) 25 MG tablet, TAKE 1 TABLET (25 MG TOTAL) BY MOUTH DAILY., Disp: 90 tablet, Rfl: 3 .  methocarbamol (ROBAXIN) 500 MG tablet, Take 1 tablet (500 mg total) by mouth every 8 (eight) hours as needed for muscle spasms., Disp: 60 tablet, Rfl: 0 .  metoprolol tartrate (LOPRESSOR) 25 MG tablet, TAKE 1 TABLET (25 MG TOTAL) BY MOUTH 2 (TWO) TIMES DAILY., Disp: 180 tablet, Rfl: 1 .  nitroGLYCERIN (NITROSTAT) 0.4 MG SL tablet, Place 1  tablet (0.4 mg total) under the tongue every 5 (five) minutes as needed for chest pain., Disp: 25 tablet, Rfl: 3 .  pravastatin (PRAVACHOL) 20 MG tablet, TAKE 1 TABLET BY MOUTH EVERY DAY IN THE EVENING, Disp: 90 tablet, Rfl: 3 .  Semaglutide,0.25 or 0.5MG /DOS, (OZEMPIC, 0.25 OR 0.5 MG/DOSE,) 2 MG/1.5ML SOPN, Inject into the skin., Disp: , Rfl:  .  spironolactone (ALDACTONE) 25 MG tablet, Take 1 tablet (25 mg total) by mouth daily. Please make yearly appt with Dr. [name] for March for future refills. 1st attempt, Disp: 90 tablet, Rfl: 0 .  Vitamin D, Ergocalciferol, (DRISDOL) 50000 units CAPS capsule, TAKE 1 CAPSULE BY MOUTH ON TUESDAY AND FRIDAY (Patient not taking: Reported on 01/01/2019), Disp: 8 capsule, Rfl: 0   Allergies  Allergen Reactions  . Hydrocodone Hives and Itching  . Hydrocodone-Acetaminophen Hives  . Morphine And Related Other (See Comments)    GI PROBLEMS  . Rosuvastatin Other (See Comments)    Pt reports causes lower extremity muscle aches/cramping  . Lisinopril Cough  . Nsaids Other (See Comments)    GI PROBLEMS     Review of Systems  Constitutional: Negative for fatigue.  Respiratory: Negative.   Cardiovascular: Negative.  Negative for palpitations and leg swelling.  Musculoskeletal: Negative.  Negative for neck pain.       Left 4th toe is darkened and painful with a callous   Skin: Negative.   Neurological: Negative for dizziness and numbness.     Today's Vitals   01/01/19 0836  BP: 128/82  Pulse: 78  SpO2: 98%  Weight: 236 lb 3.2 oz (107.1 kg)  Height: 5\' 7"  (5.465 m)   Body mass index is 36.99 kg/m.   Objective:  Physical Exam Constitutional:      Appearance: Normal appearance.  Cardiovascular:     Pulses: Normal pulses.     Heart sounds: Murmur: present.  Pulmonary:     Effort: Pulmonary effort is normal.  Skin:    General: Skin is warm and dry.     Capillary Refill: Capillary refill takes less than 2 seconds.  Neurological:     General: No  focal deficit present.     Mental Status: She is alert and oriented to person, place, and time.  Psychiatric:  Mood and Affect: Mood normal.        Behavior: Behavior normal.        Thought Content: Thought content normal.        Judgment: Judgment normal.         Assessment And Plan:     1. Cellulitis of fourth toe, left  She has a wound to her left 4th toe with an open center, the area around is darkened and painful  Will treat for cellulitis and refer to podiatry  Pulses are 2+ pedal - Cephalexin 500 MG tablet; Take 1 tablet (500 mg total) by mouth 2 (two) times daily.  Dispense: 20 tablet; Refill: 0  2. Type 2 diabetes mellitus with stage 3 chronic kidney disease, with long-term current use of insulin ([place])  Reports her blood sugars have been normal, advised to continue to monitor  I am concerned about possible infection to her toe however due to her history of diabetes want to ensure does not need further treatment - Ambulatory referral to [place], [place]

## 2019-01-04 ENCOUNTER — Telehealth: Payer: Self-pay

## 2019-01-04 NOTE — Telephone Encounter (Signed)
-----   Message from Glendale Chard, MD sent at 01/02/2019  8:13 AM EDT ----- Pls see previous response.   Lets have her come back in four weeks, four weeks after she has started higher dose. You may give her a sample, and send rx to the pharmacy.  ----- Message ----- From: Michelle Nasuti, [place]: 01/01/2019   2:10 PM EDT To: Glendale Chard, MD  The pt said she wants to know what her primary care doctor wants her to do. Come in here or go to the specialist.  ----- Message ----- From: Glendale Chard, MD Sent: 12/31/2018   5:08 PM EDT To: Michelle Nasuti, CMA  0.5mg  once weekly.   Is she going to be going back to DR. Balan for her diabetes or will she be having it managed here?  ----- Message ----- From: Michelle Nasuti, [place] Sent: 12/31/2018   4:36 PM EDT To: Glendale Chard, MD  The pt wants to know how much her Ozempic would be increased to. The pt said she didn't know how to respond in the MyChart that is why she didn't respond in it and that she is on 0.25 mg weekly.

## 2019-01-04 NOTE — Telephone Encounter (Signed)
Patient notified

## 2019-01-04 NOTE — Telephone Encounter (Signed)
-----   Message from Glendale Chard, MD sent at 01/02/2019  8:12 AM EDT ----- I am more than happy to handle her diabetes.   0.5mg  once weekly. Pls make sure she has appt 3 months after last a1c. thanks ----- Message ----- From: Michelle Nasuti, CMA Sent: 01/01/2019   2:10 PM EDT To: Glendale Chard, MD  The pt said she wants to know what her primary care doctor wants her to do. Come in here or go to the specialist.  ----- Message ----- From: Glendale Chard, MD Sent: 12/31/2018   5:08 PM EDT To: Michelle Nasuti, CMA  0.5mg  once weekly.   Is she going to be going back to DR. Balan for her diabetes or will she be having it managed here?  ----- Message ----- From: Michelle Nasuti, [place] Sent: 12/31/2018   4:36 PM EDT To: Glendale Chard, MD  The pt wants to know how much her Ozempic would be increased to. The pt said she didn't know how to respond in the MyChart that is why she didn't respond in it and that she is on 0.25 mg weekly.

## 2019-01-04 NOTE — Telephone Encounter (Signed)
Patient notified and appointment scheduled. 

## 2019-01-06 ENCOUNTER — Other Ambulatory Visit: Payer: Self-pay | Admitting: Cardiology

## 2019-01-19 ENCOUNTER — Encounter: Payer: Self-pay | Admitting: Sports Medicine

## 2019-01-19 ENCOUNTER — Other Ambulatory Visit: Payer: Self-pay

## 2019-01-19 ENCOUNTER — Other Ambulatory Visit: Payer: Self-pay | Admitting: Sports Medicine

## 2019-01-19 ENCOUNTER — Telehealth: Payer: Self-pay | Admitting: *Deleted

## 2019-01-19 ENCOUNTER — Ambulatory Visit: Payer: Medicare Other | Admitting: Sports Medicine

## 2019-01-19 ENCOUNTER — Ambulatory Visit (INDEPENDENT_AMBULATORY_CARE_PROVIDER_SITE_OTHER): Payer: Medicare Other

## 2019-01-19 VITALS — BP 102/65 | HR 88 | Temp 98.6°F | Resp 16

## 2019-01-19 DIAGNOSIS — M2042 Other hammer toe(s) (acquired), left foot: Secondary | ICD-10-CM | POA: Diagnosis not present

## 2019-01-19 DIAGNOSIS — M79675 Pain in left toe(s): Secondary | ICD-10-CM | POA: Diagnosis not present

## 2019-01-19 DIAGNOSIS — B351 Tinea unguium: Secondary | ICD-10-CM | POA: Diagnosis not present

## 2019-01-19 DIAGNOSIS — I739 Peripheral vascular disease, unspecified: Secondary | ICD-10-CM

## 2019-01-19 DIAGNOSIS — C3490 Malignant neoplasm of unspecified part of unspecified bronchus or lung: Secondary | ICD-10-CM | POA: Insufficient documentation

## 2019-01-19 DIAGNOSIS — E1142 Type 2 diabetes mellitus with diabetic polyneuropathy: Secondary | ICD-10-CM

## 2019-01-19 DIAGNOSIS — L03032 Cellulitis of left toe: Secondary | ICD-10-CM

## 2019-01-19 DIAGNOSIS — L84 Corns and callosities: Secondary | ICD-10-CM | POA: Diagnosis not present

## 2019-01-19 DIAGNOSIS — M79674 Pain in right toe(s): Secondary | ICD-10-CM | POA: Diagnosis not present

## 2019-01-19 DIAGNOSIS — L02612 Cutaneous abscess of left foot: Secondary | ICD-10-CM

## 2019-01-19 NOTE — Telephone Encounter (Signed)
Faxed orders to [facility]. 

## 2019-01-19 NOTE — Progress Notes (Signed)
Subjective: [name] is a 63 y.o. female patient who presents to office for evaluation of Left foot pain secondary to callus skin at 4th toe. Patient complains of pain at the lesion present Left foot at the 4th toe for the last 3 months. Patient has tried neosporin and PCP gave antibiotics that she finished last week. Became concerned because 4th toe started to turn dark, worse with shoes and reports that sheet hurts it. Denies drainage, open wound, warmth or redness. Patient denies any other pedal complaints.   Review of Systems  All other systems reviewed and are negative.    Patient Active Problem List   Diagnosis Date Noted  . Non-small cell lung cancer ([place]) 01/19/2019  . Cellulitis of fourth toe, left 01/01/2019  . Diverticulosis of colon without hemorrhage   . Acute on chronic diastolic CHF (congestive heart failure) ([place]) 08/04/2017  . 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 Unstable angina ([place]) 04/12/2017  . Atrial fibrillation with RVR ([place])   . Hyperlipidemia 08/07/2015  . CAD S/P percutaneous coronary angioplasty 07/25/2015  . Hypertension, essential   . Aortic insufficiency   . Stented coronary artery   . Abnormal nuclear stress test 07/11/2015  . Abnormal stress test 07/04/2015  . Pericarditis   . Controlled type 2 diabetes mellitus without complication, with long-term current use of insulin ([place])   . Pericardial effusion   . Chest pain 05/30/2015  . 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 Open angle with borderline findings and low glaucoma risk in both eyes 05/23/2015  . Restrictive lung disease 03/14/2015  . CAD (coronary artery disease) 10/21/2014  . 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 Insomnia   . Obesity   . Anemia   . Kidney stones   . UNDIAGNOSED CARDIAC MURMURS 02/24/2009  . Lung cancer  ([place]) 09/20/2008  . DYSPNEA 09/20/2008  . DIABETES MELLITUS, TYPE II 06/19/2007  . Spontaneous pneumothorax 10/21/2005    Current Outpatient Medications on File Prior to Visit  Medication Sig Dispense Refill  . B-D UF III MINI PEN NEEDLES 31G X 5 MM MISC 2 (two) times daily. as directed    . co-enzyme Q-10 50 MG capsule TAKE 1 CAPSULE (50 MG TOTAL) BY MOUTH DAILY. 90 capsule 1  . diclofenac (VOLTAREN) 75 MG EC tablet Take 75 mg by mouth 2 (two) times daily with a meal.    . diltiazem (CARDIZEM CD) 240 MG 24 hr capsule TAKE 1 CAPSULE BY MOUTH EVERY DAY 90 capsule 2  . ELIQUIS 5 MG TABS tablet TAKE 1 TABLET BY MOUTH TWICE A DAY 180 tablet 2  . furosemide (LASIX) 40 MG tablet TAKE 1 TABLET BY MOUTH EVERY DAY 90 tablet 3  . HUMALOG MIX 75/25 KWIKPEN (75-25) 100 UNIT/ML Kwikpen Inject 30-40 Units into the skin See admin instructions. Inject 40 units SQ in Am and 30 units SQ in PM  6  . levothyroxine (SYNTHROID, LEVOTHROID) 50 MCG tablet Take 50 mcg by mouth daily before breakfast.   6  . losartan (COZAAR) 25 MG tablet TAKE 1 TABLET (25 MG TOTAL) BY MOUTH DAILY. 90 tablet 2  . methocarbamol (ROBAXIN) 500 MG tablet Take 1 tablet (500 mg total) by mouth every 8 (eight) hours as needed for muscle spasms. 60 tablet 0  . metoprolol tartrate (LOPRESSOR) 25 MG tablet TAKE 1 TABLET (25 MG TOTAL) BY  MOUTH 2 (TWO) TIMES DAILY. 180 tablet 1  . nitroGLYCERIN (NITROSTAT) 0.4 MG SL tablet Place 1 tablet (0.4 mg total) under the tongue every 5 (five) minutes as needed for chest pain. 25 tablet 3  . ONE TOUCH ULTRA TEST test strip USE AS DIRECTED 3 TIMES A DAY    . pravastatin (PRAVACHOL) 20 MG tablet TAKE 1 TABLET BY MOUTH EVERY DAY IN THE EVENING 90 tablet 3  . Semaglutide,0.25 or 0.5MG /DOS, (OZEMPIC, 0.25 OR 0.5 MG/DOSE,) 2 MG/1.5ML SOPN Inject into the skin.    [name] spironolactone (ALDACTONE) 25 MG tablet Take 1 tablet (25 mg total) by mouth daily. Please make yearly appt with Dr. [name] for March for future  refills. 1st attempt 90 tablet 0  . Vitamin D, Ergocalciferol, (DRISDOL) 50000 units CAPS capsule TAKE 1 CAPSULE BY MOUTH ON TUESDAY AND FRIDAY (Patient not taking: Reported on 01/01/2019) 8 capsule 0   No current facility-administered medications on file prior to visit.     Allergies  Allergen Reactions  . Hydrocodone Hives and Itching  . Hydrocodone-Acetaminophen Hives  . Morphine And Related Other (See Comments)    GI PROBLEMS  . Rosuvastatin Other (See Comments)    Pt reports causes lower extremity muscle aches/cramping  . Lisinopril Cough  . Nsaids Other (See Comments)    GI PROBLEMS    Objective:  General: Alert and oriented x3 in no acute distress  Dermatology: Keratotic lesion present Left 4th toe with skin lines transversing the lesion, pain is present with direct pressure to the lesion with a central nucleated core noted, no webspace macerations, no ecchymosis bilateral, all nails x 10 are thickened and elongated.   Vascular: Dorsalis Pedis and Posterior Tibial pedal pulses 1/4, Capillary Fill Time 5seconds, + pedal hair growth bilateral, no edema bilateral lower extremities, Temperature gradient within normal limits.  Neurology: Johney Maine sensation intact via light touch bilateral. Protective sensation intact bilateral.   Musculoskeletal: Mild tenderness with palpation at the keratotic lesion site on Left, 4th toe Muscular strength 5/5 in all groups without pain or limitation on range of motion.+ bunion and hammertoe lower extremity muscular or boney deformity noted.  Assessment and Plan: Problem List Items Addressed This Visit    None    Visit Diagnoses    Hammer toe of left foot    -  Primary   Corn of toe       PVD (peripheral vascular disease) ([place])       Pain due to onychomycosis of toenails of both feet       Diabetic polyneuropathy associated with type 2 diabetes mellitus (H[facility])         -Complete examination performed -Xrays reviewed consistent with hammertoes  and inferior and posterior spur -Discussed treatment options -Mechanically debrided nails x 10 using sterile nail nipper without incident -Parred keratoic lesion using a chisel blade; treated the area withSalinocaine covered with bandaid at left 4th toe -Recommend toe cap -Encouraged daily skin emollients -Advised good supportive shoes and inserts -Order ABIs for survillence  -Patient to return to office after ABIs or sooner if condition worsens.  Landis Martins, DPM

## 2019-01-19 NOTE — Telephone Encounter (Signed)
-----   Message from Landis Martins, [place] sent at 01/19/2019  8:53 AM EDT ----- Regarding: ABIs Preulcerative corn history of diabetes Survillence for PVD -Dr Cannon Kettle

## 2019-02-13 ENCOUNTER — Other Ambulatory Visit: Payer: Self-pay | Admitting: Cardiology

## 2019-02-13 DIAGNOSIS — I308 Other forms of acute pericarditis: Secondary | ICD-10-CM

## 2019-02-16 ENCOUNTER — Other Ambulatory Visit: Payer: Self-pay

## 2019-02-16 ENCOUNTER — Inpatient Hospital Stay (HOSPITAL_COMMUNITY)
Admission: EM | Admit: 2019-02-16 | Discharge: 2019-02-21 | DRG: 309 | Disposition: A | Discharge status: Home / Self Care | Payer: Medicare Other | Attending: Internal Medicine | Admitting: Internal Medicine

## 2019-02-16 ENCOUNTER — Emergency Department (HOSPITAL_COMMUNITY): Payer: Medicare Other

## 2019-02-16 ENCOUNTER — Encounter (HOSPITAL_COMMUNITY): Payer: Self-pay | Admitting: *Deleted

## 2019-02-16 DIAGNOSIS — Z9221 Personal history of antineoplastic chemotherapy: Secondary | ICD-10-CM

## 2019-02-16 DIAGNOSIS — I480 Paroxysmal atrial fibrillation: Principal | ICD-10-CM | POA: Diagnosis present

## 2019-02-16 DIAGNOSIS — Z885 Allergy status to narcotic agent status: Secondary | ICD-10-CM

## 2019-02-16 DIAGNOSIS — Z800 Family history of malignant neoplasm of digestive organs: Secondary | ICD-10-CM

## 2019-02-16 DIAGNOSIS — Z833 Family history of diabetes mellitus: Secondary | ICD-10-CM

## 2019-02-16 DIAGNOSIS — R072 Precordial pain: Secondary | ICD-10-CM

## 2019-02-16 DIAGNOSIS — E119 Type 2 diabetes mellitus without complications: Secondary | ICD-10-CM | POA: Diagnosis present

## 2019-02-16 DIAGNOSIS — C3490 Malignant neoplasm of unspecified part of unspecified bronchus or lung: Secondary | ICD-10-CM | POA: Diagnosis present

## 2019-02-16 DIAGNOSIS — Z85118 Personal history of other malignant neoplasm of bronchus and lung: Secondary | ICD-10-CM

## 2019-02-16 DIAGNOSIS — D649 Anemia, unspecified: Secondary | ICD-10-CM | POA: Diagnosis present

## 2019-02-16 DIAGNOSIS — Z888 Allergy status to other drugs, medicaments and biological substances status: Secondary | ICD-10-CM

## 2019-02-16 DIAGNOSIS — Z79899 Other long term (current) drug therapy: Secondary | ICD-10-CM

## 2019-02-16 DIAGNOSIS — I4891 Unspecified atrial fibrillation: Secondary | ICD-10-CM | POA: Diagnosis present

## 2019-02-16 DIAGNOSIS — E785 Hyperlipidemia, unspecified: Secondary | ICD-10-CM | POA: Diagnosis present

## 2019-02-16 DIAGNOSIS — Z87891 Personal history of nicotine dependence: Secondary | ICD-10-CM

## 2019-02-16 DIAGNOSIS — I2510 Atherosclerotic heart disease of native coronary artery without angina pectoris: Secondary | ICD-10-CM | POA: Diagnosis present

## 2019-02-16 DIAGNOSIS — I110 Hypertensive heart disease with heart failure: Secondary | ICD-10-CM | POA: Diagnosis present

## 2019-02-16 DIAGNOSIS — Z96651 Presence of right artificial knee joint: Secondary | ICD-10-CM | POA: Diagnosis present

## 2019-02-16 DIAGNOSIS — M79662 Pain in left lower leg: Secondary | ICD-10-CM | POA: Diagnosis present

## 2019-02-16 DIAGNOSIS — Z7901 Long term (current) use of anticoagulants: Secondary | ICD-10-CM

## 2019-02-16 DIAGNOSIS — Z8249 Family history of ischemic heart disease and other diseases of the circulatory system: Secondary | ICD-10-CM

## 2019-02-16 DIAGNOSIS — Z90710 Acquired absence of both cervix and uterus: Secondary | ICD-10-CM

## 2019-02-16 DIAGNOSIS — I5032 Chronic diastolic (congestive) heart failure: Secondary | ICD-10-CM | POA: Diagnosis present

## 2019-02-16 DIAGNOSIS — I10 Essential (primary) hypertension: Secondary | ICD-10-CM | POA: Diagnosis present

## 2019-02-16 DIAGNOSIS — E876 Hypokalemia: Secondary | ICD-10-CM | POA: Diagnosis present

## 2019-02-16 DIAGNOSIS — Z79890 Hormone replacement therapy (postmenopausal): Secondary | ICD-10-CM

## 2019-02-16 DIAGNOSIS — Z923 Personal history of irradiation: Secondary | ICD-10-CM

## 2019-02-16 DIAGNOSIS — I351 Nonrheumatic aortic (valve) insufficiency: Secondary | ICD-10-CM | POA: Diagnosis present

## 2019-02-16 DIAGNOSIS — J449 Chronic obstructive pulmonary disease, unspecified: Secondary | ICD-10-CM | POA: Diagnosis present

## 2019-02-16 DIAGNOSIS — R079 Chest pain, unspecified: Secondary | ICD-10-CM | POA: Diagnosis present

## 2019-02-16 DIAGNOSIS — Z794 Long term (current) use of insulin: Secondary | ICD-10-CM

## 2019-02-16 DIAGNOSIS — Z955 Presence of coronary angioplasty implant and graft: Secondary | ICD-10-CM

## 2019-02-16 DIAGNOSIS — Z9861 Coronary angioplasty status: Secondary | ICD-10-CM

## 2019-02-16 DIAGNOSIS — E039 Hypothyroidism, unspecified: Secondary | ICD-10-CM | POA: Diagnosis present

## 2019-02-16 DIAGNOSIS — Z841 Family history of disorders of kidney and ureter: Secondary | ICD-10-CM

## 2019-02-16 LAB — CBC
HCT: 36.5 % (ref 36.0–46.0)
Hemoglobin: 11.9 g/dL — ABNORMAL LOW (ref 12.0–15.0)
MCH: 31.6 pg (ref 26.0–34.0)
MCHC: 32.6 g/dL (ref 30.0–36.0)
MCV: 97.1 fL (ref 80.0–100.0)
Platelets: 329 10*3/uL (ref 150–400)
RBC: 3.76 MIL/uL — ABNORMAL LOW (ref 3.87–5.11)
RDW: 11.9 % (ref 11.5–15.5)
WBC: 9.7 10*3/uL (ref 4.0–10.5)
nRBC: 0 % (ref 0.0–0.2)

## 2019-02-16 LAB — I-STAT BETA HCG BLOOD, ED (MC, WL, AP ONLY): I-stat hCG, quantitative: <5 m[IU]/mL (ref <5)

## 2019-02-16 MED ORDER — DILTIAZEM LOAD VIA INFUSION
15.0000 mg | Freq: Once | INTRAVENOUS | Status: AC
  Administered 2019-02-17: 15 mg via INTRAVENOUS
  Filled 2019-02-16: qty 15

## 2019-02-16 MED ORDER — DILTIAZEM HCL-DEXTROSE 100-5 MG/100ML-% IV SOLN (PREMIX)
5.0000 mg/h | INTRAVENOUS | Status: DC
  Administered 2019-02-17: 5 mg/h via INTRAVENOUS
  Filled 2019-02-16 (×2): qty 100

## 2019-02-16 MED ORDER — SODIUM CHLORIDE 0.9% FLUSH
3.0000 mL | Freq: Once | INTRAVENOUS | Status: AC
  Administered 2019-02-16: 3 mL via INTRAVENOUS

## 2019-02-16 NOTE — ED Provider Notes (Signed)
[facility] EMERGENCY DEPARTMENT Provider Note   CSN: [id number] Arrival date & time: 02/16/19  2311    History   Chief Complaint Chief Complaint  Patient presents with  . Atrial Fibrillation    HPI [name] is a 63 y.o. female.     HPI  This is a 63 year old female with a history of atrial fibrillation, coronary artery disease, aortic insufficiency who presents with palpitations and chest pain.  Patient reports that she had onset of chest pains on Sunday.  She states that "I did my deep breathing" and they away.  She has had chest pains on and off over the last 24 to 48 hours.  They are not exertional.  She does state that she can feel palpitations and feels like she has been in and out of atrial fibrillation over the last several days.  Currently she rates her pain at 8 out of 10.  It is nonradiating.  She reports some associated shortness of breath.  No fevers or cough.  She does report some increased lower extremity swelling.  She takes Eliquis daily and reports compliance.  Cardiac history notable for drug-eluting stents to the circumflex and LAD in 2016 with patent stents noted on catheterization in February 2018, chronic pericardial effusion, paroxysmal atrial fibrillation on Eliquis, diastolic heart failure, diabetes, hypertension, aortic insufficiency.  Past Medical History:  Diagnosis Date  . Allergy   . Anemia   . Anemia   . Aortic insufficiency    a. Mod by echo 05/2015.  . Arthritis    "knees" (07/11/2015)  . Atrial fibrillation (HCC)    On Eliquis  . 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 Complication of anesthesia    "I had a hard time waking me up w/one of my shoulder surgeries"  . Coronary artery disease    a. Abnormal stress test -> LHc 06/2015 s/p DES to mCX and mLAD, residual D2 disease treated medially.  .  Diverticulitis   . Elevated blood pressure   . Erythema nodosum   . Heart murmur   . Insomnia   . lung ca dx'd 10/1999   chemo/xrt comp 05/29/2000  . Myalgia   . Nephrolithiasis 2015  . Obesity   . Pericardial effusion    a. 05/2015 Echo: EF 55-60%, no rwma, Gr 1 DD, no effusion - but an effusion was seen on CT which was felt to be increased in size compared to 12/2014 - pericardium also thickened.   . Pericarditis    a. Dx 05/2015.  [name] Pneumothorax, spontaneous, tension 01/20/2012   October, 2010  . Pulmonary fibrosis ([place]) 01/20/2012   Due to previous surgery and chest radiation for lung cancer  . Restrictive lung disease   . Type II diabetes mellitus [facility])     Patient Active Problem List   Diagnosis Date Noted  . Non-small cell lung cancer ([place]) 01/19/2019  . Cellulitis of fourth toe, left 01/01/2019  . Diverticulosis of colon without hemorrhage   . Acute on chronic diastolic CHF (congestive heart failure) ([place]) 08/04/2017  . 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 Unstable angina ([place]) 04/12/2017  . Atrial fibrillation with RVR ([place])   . Hyperlipidemia 08/07/2015  . CAD S/P percutaneous coronary angioplasty 07/25/2015  . Hypertension, essential   . Aortic insufficiency   . Stented coronary artery   . Abnormal nuclear  stress test 07/11/2015  . Abnormal stress test 07/04/2015  . Pericarditis   . Controlled type 2 diabetes mellitus without complication, with long-term current use of insulin ([place])   . Pericardial effusion   . Chest pain 05/30/2015  . 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 Open angle with borderline findings and low glaucoma risk in both eyes 05/23/2015  . Restrictive lung disease 03/14/2015  . CAD (coronary artery disease) 10/21/2014  . 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 Insomnia   . Obesity   .  Anemia   . Kidney stones   . UNDIAGNOSED CARDIAC MURMURS 02/24/2009  . Lung cancer ([place]) 09/20/2008  . DYSPNEA 09/20/2008  . DIABETES MELLITUS, TYPE II 06/19/2007  . Spontaneous pneumothorax 10/21/2005    Past Surgical History:  Procedure Laterality Date  . ABDOMINAL HYSTERECTOMY  2008  . APPENDECTOMY  1969?  [name] CARDIAC CATHETERIZATION N/A 07/11/2015   Procedure: Left Heart Cath and Coronary Angiography;  Surgeon: Jettie Booze, MD;  Location: [facility] CV LAB;  Service: Cardiovascular;  Laterality: N/A;  . CARDIAC CATHETERIZATION  07/11/2015   Procedure: Coronary Stent Intervention;  Surgeon: Jettie Booze, MD;  Location: [facility] LAB;  Service: Cardiovascular;;  . CARPAL TUNNEL RELEASE Bilateral 1998-2005?   right-left  . CARPAL TUNNEL RELEASE Left 2009?   "for the 2nd time"  . COLONOSCOPY    . COLONOSCOPY WITH PROPOFOL N/A 05/28/2018   Procedure: COLONOSCOPY WITH PROPOFOL;  Surgeon: Milus Banister, MD;  Location: [facility];  Service: Endoscopy;  Laterality: N/A;  . CORONARY ANGIOPLASTY    . CYSTOSCOPY W/ STONE MANIPULATION  2013  . ESOPHAGOGASTRODUODENOSCOPY (EGD) WITH ESOPHAGEAL DILATION  2012  . FOOT SURGERY Bilateral 1980's   Callous removed   . INNER EAR SURGERY Right ~ 2009  . KNEE ARTHROPLASTY Right 1991  . KNEE ARTHROSCOPY  ~ 2000   LATERAL RELEASE  . LEFT HEART CATH AND CORONARY ANGIOGRAPHY N/A 12/18/2016   Procedure: Left Heart Cath and Coronary Angiography;  Surgeon: Burnell Blanks, MD;  Location: [facility] CV LAB;  Service: Cardiovascular;  Laterality: N/A;  . LUNG CANCER SURGERY Left 2001   "small cell"  . LUNG SURGERY Right 2007   "reinflated it"  . PULMONARY EMBOLISM SURGERY  2007   LUNG COLLAPSE  . RIGHT AND LEFT HEART CATH N/A 06/25/2017   Procedure: RIGHT AND LEFT HEART CATH;  Surgeon: Sherren Mocha, MD;  Location: [facility] CV LAB;  Service: Cardiovascular;  Laterality: N/A;  . SHOULDER ADHESION RELEASE Right ~ 2004  .  SHOULDER ARTHROSCOPY W/ ROTATOR CUFF REPAIR Right ~ 2003     OB History   No obstetric history on file.      Home Medications    Prior to Admission medications   Medication Sig Start Date End Date Taking? Authorizing Provider  B-D UF III MINI PEN NEEDLES 31G X 5 MM MISC 2 (two) times daily. as directed 10/31/18   [provider]  co-enzyme Q-10 50 MG capsule TAKE 1 CAPSULE (50 MG TOTAL) BY MOUTH DAILY. 05/29/18   Dorothy Spark, MD  diclofenac (VOLTAREN) 75 MG EC tablet Take 75 mg by mouth 2 (two) times daily with a meal. 01/12/19   [provider]  diltiazem (CARDIZEM CD) 240 MG 24 hr capsule TAKE 1 CAPSULE BY MOUTH EVERY DAY 02/13/19   Dorothy Spark, MD  ELIQUIS 5 MG TABS tablet TAKE 1 TABLET BY MOUTH TWICE A DAY 12/02/18   Dorothy Spark, MD  furosemide (LASIX) 40 MG tablet TAKE 1 TABLET BY MOUTH EVERY DAY 08/26/18   Dorothy Spark, MD  HUMALOG MIX 75/25 KWIKPEN (75-25) 100 UNIT/ML Kwikpen Inject 30-40 Units into the skin See admin instructions. Inject 40 units SQ in Am and 30 units SQ in PM 12/22/15   [provider]  levothyroxine (SYNTHROID, LEVOTHROID) 50 MCG tablet Take 50 mcg by mouth daily before breakfast.  05/14/17   [provider]  losartan (COZAAR) 25 MG tablet TAKE 1 TABLET (25 MG TOTAL) BY MOUTH DAILY. 01/06/19   Dorothy Spark, MD  methocarbamol (ROBAXIN) 500 MG tablet Take 1 tablet (500 mg total) by mouth every 8 (eight) hours as needed for muscle spasms. 12/23/18   Glendale Chard, MD  metoprolol tartrate (LOPRESSOR) 25 MG tablet TAKE 1 TABLET (25 MG TOTAL) BY MOUTH 2 (TWO) TIMES DAILY. 08/14/18   Dorothy Spark, MD  nitroGLYCERIN (NITROSTAT) 0.4 MG SL tablet Place 1 tablet (0.4 mg total) under the tongue every 5 (five) minutes as needed for chest pain. 01/02/18   Rosita Fire, Brittainy M, PA-C  ONE TOUCH ULTRA TEST test strip USE AS DIRECTED 3 TIMES A DAY 10/04/18   [provider]  pravastatin (PRAVACHOL) 20 MG tablet  TAKE 1 TABLET BY MOUTH EVERY DAY IN THE EVENING 11/13/18   Dorothy Spark, MD  Semaglutide,0.25 or 0.5MG /DOS, (OZEMPIC, 0.25 OR 0.5 MG/DOSE,) 2 MG/1.5ML SOPN Inject into the skin.    [provider]  spironolactone (ALDACTONE) 25 MG tablet Take 1 tablet (25 mg total) by mouth daily. 02/13/19   Dorothy Spark, MD  Vitamin D, Ergocalciferol, (DRISDOL) 50000 units CAPS capsule TAKE 1 CAPSULE BY MOUTH ON TUESDAY AND FRIDAY Patient not taking: Reported on 01/01/2019 07/27/18   Glendale Chard, MD    Family History Family History  Problem Relation Age of Onset  . Heart disease Father   . Diabetes Father   . Pancreatic cancer Mother   . Diabetes Mother   . Hypertension Sister        x 3  . Diabetes Sister        x 4  . Diabetes Maternal Grandmother   . Diabetes Paternal Grandmother   . Diabetes Sister   . Diabetes Sister   . Diabetes Sister   . Kidney disease Sister   . Colon cancer Neg Hx   . Colon polyps Neg Hx   . Rectal cancer Neg Hx   . Stomach cancer Neg Hx   . Esophageal cancer Neg Hx   . Breast cancer Neg Hx     Social History Social History   Tobacco Use  . Smoking status: Former Smoker    Packs/day: 0.25    Years: 5.00    Pack years: 1.25    Types: Cigarettes    Last attempt to quit: 07/22/1999    Years since quitting: 19.5  . Smokeless tobacco: Never Used  Substance Use Topics  . Alcohol use: No    Alcohol/week: 0.0 standard drinks  . Drug use: No     Allergies   Hydrocodone; Hydrocodone-acetaminophen; Morphine and related; Rosuvastatin; Lisinopril; and Nsaids   Review of Systems Review of Systems  Constitutional: Negative for fever.  Respiratory: Positive for shortness of breath. Negative for cough.   Cardiovascular: Positive for chest pain, palpitations and leg swelling.  Gastrointestinal: Negative for abdominal pain, nausea and vomiting.  Genitourinary: Negative for dysuria.  Neurological: Negative for headaches.  All other systems  reviewed and are negative.    Physical Exam Updated Vital Signs BP (!) 114/57   Pulse (!) 115   Temp (!) 97.5 F (36.4 C) (Oral)   Resp 14   Wt 105.2 kg   SpO2 100%   BMI 36.34 kg/m   Physical Exam Vitals signs and nursing note reviewed.  Constitutional:      Appearance: She is well-developed. She is obese. She is not ill-appearing.  HENT:     Head: Normocephalic and atraumatic.  Eyes:     Pupils: Pupils are equal, round, and reactive to light.  Neck:     Musculoskeletal: Neck supple.  Cardiovascular:     Heart sounds: Murmur present. Friction rub present.     Comments: Tachycardia, irregular rhythm Pulmonary:     Effort: Pulmonary effort is normal. No respiratory distress.     Breath sounds: No wheezing.  Abdominal:     General: Bowel sounds are normal.     Palpations: Abdomen is soft.     Tenderness: There is no guarding or rebound.  Musculoskeletal:     Comments: Trace bilateral lower extremity edema  Skin:    General: Skin is warm and dry.  Neurological:     Mental Status: She is alert and oriented to person, place, and time.  Psychiatric:        Mood and Affect: Mood normal.      ED Treatments / Results  Labs (all labs ordered are listed, but only abnormal results are displayed) Labs Reviewed  BASIC METABOLIC PANEL - Abnormal; Notable for the following components:      Result Value   Glucose, Bld 244 (*)    BUN 21 (*)    All other components within normal limits  CBC - Abnormal; Notable for the following components:   RBC 3.76 (*)    Hemoglobin 11.9 (*)    All other components within normal limits  TROPONIN I  MAGNESIUM  I-STAT BETA HCG BLOOD, ED ([facility], [facility], [facility] ONLY)    EKG EKG Interpretation  Date/Time:  Tuesday February 16 2019 23:18:59 EDT Ventricular Rate:  160 PR Interval:    QRS Duration: 92 QT Interval:  300 QTC Calculation: 489 R Axis:   51 Text Interpretation:  Atrial fibrillation with rapid ventricular response Cannot rule out  Anterior infarct , age undetermined Abnormal ECG Confirmed by Thayer Jew [phone]) on 02/16/2019 11:42:38 PM   EKG Interpretation  Date/Time:  Wednesday February 17 2019 00:23:19 EDT Ventricular Rate:  100 PR Interval:    QRS Duration: 83 QT Interval:  348 QTC Calculation: 449 R Axis:   62 Text Interpretation:  Sinus tachycardia Abnormal R-wave progression, early transition No longer in atrial fibrillation Confirmed by Thayer Jew [phone]) on 02/17/2019 12:25:46 AM       Radiology Dg Chest Port 1 View  Result Date: 02/17/2019 CLINICAL DATA:  Initial evaluation for acute chest pain. EXAM: PORTABLE CHEST 1 VIEW COMPARISON:  Prior radiograph from 12/29/2018 FINDINGS: Cardiomegaly with prominence of the right atrial contour, stable. Coronary stent in place. Mediastinal silhouette within normal limits. Elevation of the left hemidiaphragm with postsurgical changes at the left lung apex, stable. Additional postsurgical changes present at the right upper lobe. No focal infiltrates. No edema or effusion. No visible pneumothorax. No acute osseous finding. IMPRESSION: 1. No active cardiopulmonary disease. 2. Chronic postsurgical changes involving the upper lobes bilaterally, left greater than right, stable. 3. Cardiomegaly,  unchanged. Electronically Signed   By: Jeannine Boga M.D.   On: 02/17/2019 00:04    Procedures Procedures (including critical care time)  CRITICAL CARE Performed by: Merryl Hacker   Total critical care time: 40 minutes  Critical care time was exclusive of separately billable procedures and treating other patients.  Critical care was necessary to treat or prevent imminent or life-threatening deterioration.  Critical care was time spent personally by me on the following activities: development of treatment plan with patient and/or surrogate as well as nursing, discussions with consultants, evaluation of patient's response to treatment, examination of patient,  obtaining history from patient or surrogate, ordering and performing treatments and interventions, ordering and review of laboratory studies, ordering and review of radiographic studies, pulse oximetry and re-evaluation of patient's condition.   Medications Ordered in ED Medications  diltiazem (CARDIZEM) 1 mg/mL load via infusion 15 mg (15 mg Intravenous Bolus from Bag 02/17/19 0005)    And  diltiazem (CARDIZEM) 100 mg in dextrose 5% 154mL (1 mg/mL) infusion (5 mg/hr Intravenous New Bag/Given 02/17/19 0007)  nitroGLYCERIN (NITROGLYN) 2 % ointment 1 inch (has no administration in time range)  aspirin chewable tablet 324 mg (has no administration in time range)  nitroGLYCERIN 50 mg in dextrose 5 % 250 mL (0.2 mg/mL) infusion (has no administration in time range)  sodium chloride flush (NS) 0.9 % injection 3 mL (3 mLs Intravenous Given 02/16/19 2342)     Initial Impression / Assessment and Plan / ED Course  I have reviewed the triage vital signs and the nursing notes.  Pertinent labs & imaging results that were available during my care of the patient were reviewed by me and considered in my medical decision making (see chart for details).  Clinical Course as of Feb 17 120  Wed Feb 17, 2019  0024 Diltiazem drip infusing.  Patient now appears to be in normal sinus rhythm with a rate in the 90s.  She is having some ongoing chest pain at 6 out of 10.  She was given nitro and aspirin.  Repeat EKG obtained.   [CH]  0117 Work-up is largely reassuring including initial troponin.  Patient is having ongoing chest pain despite nitroglycerin.  Given her history, she is high risk.  Will admit for serial troponins.  This was discussed with cardiology.  Recommend admission to the hospitalist.   [CH]    Clinical Course User Index [CH] Horton, Barbette Hair, MD       Patient presents with chest pain.  Noted to be in atrial fibrillation with RVR.  History of the same.  Also with a history of coronary artery  disease, chronic pericarditis with pericardial effusion.  She is overall nontoxic-appearing and her blood pressure is within normal range.  She takes both metoprolol and diltiazem.  She was loaded and started on a diltiazem infusion.  She converted to sinus rhythm.  ACS work-up with negative troponin.  Repeat EKG shows no evidence of acute ischemia.  After conversion, patient had continued chest pain that she describes as pressure-like.  It did improve some but still rated at 6 out of 10.  She was started on a nitroglycerin drip.  Chest x-ray shows no evidence of pneumothorax or pneumonia.  She has chronic changes related to prior surgery.  On multiple rechecks she is hemodynamically stable on a diltiazem and nitroglycerin drip.  Because of her history, cardiology was consulted.  Will plan for admission to the hospitalist for serial enzymes.  Final Clinical  Impressions(s) / ED Diagnoses   Final diagnoses:  Atrial fibrillation with RVR ([place])  Precordial pain    ED Discharge Orders         Ordered    Amb referral to AFIB Clinic     02/16/19 2342           Merryl Hacker, MD 02/17/19 [phone]

## 2019-02-16 NOTE — ED Triage Notes (Signed)
Pt states that she thinks that she is having rapid a-fib.  Pt states that she began having CP yesterday but it subsided and today around 6pm she began to feel her heart racing and she is having Cp and a little bit of sob.

## 2019-02-17 ENCOUNTER — Observation Stay (HOSPITAL_BASED_OUTPATIENT_CLINIC_OR_DEPARTMENT_OTHER): Payer: Medicare Other

## 2019-02-17 DIAGNOSIS — Z794 Long term (current) use of insulin: Secondary | ICD-10-CM

## 2019-02-17 DIAGNOSIS — Z85118 Personal history of other malignant neoplasm of bronchus and lung: Secondary | ICD-10-CM | POA: Diagnosis not present

## 2019-02-17 DIAGNOSIS — Z79890 Hormone replacement therapy (postmenopausal): Secondary | ICD-10-CM | POA: Diagnosis not present

## 2019-02-17 DIAGNOSIS — D649 Anemia, unspecified: Secondary | ICD-10-CM | POA: Diagnosis present

## 2019-02-17 DIAGNOSIS — Z800 Family history of malignant neoplasm of digestive organs: Secondary | ICD-10-CM | POA: Diagnosis not present

## 2019-02-17 DIAGNOSIS — I82402 Acute embolism and thrombosis of unspecified deep veins of left lower extremity: Secondary | ICD-10-CM | POA: Diagnosis not present

## 2019-02-17 DIAGNOSIS — Z87891 Personal history of nicotine dependence: Secondary | ICD-10-CM | POA: Diagnosis not present

## 2019-02-17 DIAGNOSIS — J449 Chronic obstructive pulmonary disease, unspecified: Secondary | ICD-10-CM | POA: Diagnosis present

## 2019-02-17 DIAGNOSIS — Z885 Allergy status to narcotic agent status: Secondary | ICD-10-CM | POA: Diagnosis not present

## 2019-02-17 DIAGNOSIS — I480 Paroxysmal atrial fibrillation: Secondary | ICD-10-CM | POA: Diagnosis present

## 2019-02-17 DIAGNOSIS — Z8249 Family history of ischemic heart disease and other diseases of the circulatory system: Secondary | ICD-10-CM | POA: Diagnosis not present

## 2019-02-17 DIAGNOSIS — Z955 Presence of coronary angioplasty implant and graft: Secondary | ICD-10-CM | POA: Diagnosis not present

## 2019-02-17 DIAGNOSIS — E119 Type 2 diabetes mellitus without complications: Secondary | ICD-10-CM

## 2019-02-17 DIAGNOSIS — R072 Precordial pain: Secondary | ICD-10-CM | POA: Diagnosis present

## 2019-02-17 DIAGNOSIS — I4891 Unspecified atrial fibrillation: Secondary | ICD-10-CM

## 2019-02-17 DIAGNOSIS — E039 Hypothyroidism, unspecified: Secondary | ICD-10-CM | POA: Diagnosis present

## 2019-02-17 DIAGNOSIS — Z90710 Acquired absence of both cervix and uterus: Secondary | ICD-10-CM | POA: Diagnosis not present

## 2019-02-17 DIAGNOSIS — C3492 Malignant neoplasm of unspecified part of left bronchus or lung: Secondary | ICD-10-CM

## 2019-02-17 DIAGNOSIS — Z841 Family history of disorders of kidney and ureter: Secondary | ICD-10-CM | POA: Diagnosis not present

## 2019-02-17 DIAGNOSIS — Z9861 Coronary angioplasty status: Secondary | ICD-10-CM | POA: Diagnosis not present

## 2019-02-17 DIAGNOSIS — I2510 Atherosclerotic heart disease of native coronary artery without angina pectoris: Secondary | ICD-10-CM | POA: Diagnosis present

## 2019-02-17 DIAGNOSIS — I5032 Chronic diastolic (congestive) heart failure: Secondary | ICD-10-CM | POA: Diagnosis present

## 2019-02-17 DIAGNOSIS — R079 Chest pain, unspecified: Secondary | ICD-10-CM | POA: Diagnosis not present

## 2019-02-17 DIAGNOSIS — Z888 Allergy status to other drugs, medicaments and biological substances status: Secondary | ICD-10-CM | POA: Diagnosis not present

## 2019-02-17 DIAGNOSIS — M79662 Pain in left lower leg: Secondary | ICD-10-CM | POA: Diagnosis present

## 2019-02-17 DIAGNOSIS — E785 Hyperlipidemia, unspecified: Secondary | ICD-10-CM

## 2019-02-17 DIAGNOSIS — R0789 Other chest pain: Secondary | ICD-10-CM

## 2019-02-17 DIAGNOSIS — I110 Hypertensive heart disease with heart failure: Secondary | ICD-10-CM | POA: Diagnosis present

## 2019-02-17 DIAGNOSIS — Z923 Personal history of irradiation: Secondary | ICD-10-CM | POA: Diagnosis not present

## 2019-02-17 DIAGNOSIS — Z7901 Long term (current) use of anticoagulants: Secondary | ICD-10-CM | POA: Diagnosis not present

## 2019-02-17 DIAGNOSIS — Z9221 Personal history of antineoplastic chemotherapy: Secondary | ICD-10-CM | POA: Diagnosis not present

## 2019-02-17 DIAGNOSIS — Z833 Family history of diabetes mellitus: Secondary | ICD-10-CM | POA: Diagnosis not present

## 2019-02-17 LAB — BASIC METABOLIC PANEL
Anion gap: 14 (ref 5–15)
Anion gap: 11 (ref 5–15)
BUN: 21 mg/dL — ABNORMAL HIGH (ref 6–20)
BUN: 20 mg/dL (ref 6–20)
CO2: 23 mmol/L (ref 22–32)
CO2: 26 mmol/L (ref 22–32)
Calcium: 9.1 mg/dL (ref 8.9–10.3)
Calcium: 8.7 mg/dL — ABNORMAL LOW (ref 8.9–10.3)
Chloride: 102 mmol/L (ref 98–111)
Chloride: 104 mmol/L (ref 98–111)
Creatinine, Ser: 0.94 mg/dL (ref 0.44–1.00)
Creatinine, Ser: 1.02 mg/dL — ABNORMAL HIGH (ref 0.44–1.00)
GFR calc Af Amer: >60 mL/min (ref >60)
GFR calc Af Amer: >60 mL/min (ref >60)
GFR calc non Af Amer: >60 mL/min (ref >60)
GFR calc non Af Amer: 60 mL/min — ABNORMAL LOW (ref >60)
Glucose, Bld: 244 mg/dL — ABNORMAL HIGH (ref 70–99)
Glucose, Bld: 142 mg/dL — ABNORMAL HIGH (ref 70–99)
Potassium: 3.6 mmol/L (ref 3.5–5.1)
Potassium: 3.4 mmol/L — ABNORMAL LOW (ref 3.5–5.1)
Sodium: 139 mmol/L (ref 135–145)
Sodium: 141 mmol/L (ref 135–145)

## 2019-02-17 LAB — ECHOCARDIOGRAM COMPLETE
Height: 67.5 in
Weight: 3834.24 oz

## 2019-02-17 LAB — HEMOGLOBIN A1C
Hgb A1c MFr Bld: 7 % — ABNORMAL HIGH (ref 4.8–5.6)
Mean Plasma Glucose: 154.2 mg/dL

## 2019-02-17 LAB — BRAIN NATRIURETIC PEPTIDE: B Natriuretic Peptide: 46.5 pg/mL (ref 0.0–100.0)

## 2019-02-17 LAB — TROPONIN I
Troponin I: <0.03 ng/mL (ref <0.03)
Troponin I: <0.03 ng/mL (ref <0.03)
Troponin I: <0.03 ng/mL (ref <0.03)
Troponin I: <0.03 ng/mL (ref <0.03)

## 2019-02-17 LAB — RAPID URINE DRUG SCREEN, HOSP PERFORMED
Amphetamines: NOT DETECTED
Barbiturates: NOT DETECTED
Benzodiazepines: NOT DETECTED
Cocaine: NOT DETECTED
Opiates: NOT DETECTED
Tetrahydrocannabinol: NOT DETECTED

## 2019-02-17 LAB — LIPID PANEL
Cholesterol: 121 mg/dL (ref 0–200)
HDL: 39 mg/dL — ABNORMAL LOW (ref >40)
LDL Cholesterol: 65 mg/dL (ref 0–99)
Total CHOL/HDL Ratio: 3.1 RATIO
Triglycerides: 87 mg/dL (ref <150)
VLDL: 17 mg/dL (ref 0–40)

## 2019-02-17 LAB — MAGNESIUM
Magnesium: 1.9 mg/dL (ref 1.7–2.4)
Magnesium: 2.2 mg/dL (ref 1.7–2.4)

## 2019-02-17 LAB — GLUCOSE, CAPILLARY
Glucose-Capillary: 143 mg/dL — ABNORMAL HIGH (ref 70–99)
Glucose-Capillary: 123 mg/dL — ABNORMAL HIGH (ref 70–99)
Glucose-Capillary: 276 mg/dL — ABNORMAL HIGH (ref 70–99)
Glucose-Capillary: 175 mg/dL — ABNORMAL HIGH (ref 70–99)

## 2019-02-17 LAB — HIV ANTIBODY (ROUTINE TESTING W REFLEX): HIV Screen 4th Generation wRfx: NONREACTIVE

## 2019-02-17 LAB — POTASSIUM: Potassium: 4.2 mmol/L (ref 3.5–5.1)

## 2019-02-17 LAB — TSH: TSH: 2.471 u[IU]/mL (ref 0.350–4.500)

## 2019-02-17 MED ORDER — ONDANSETRON HCL 4 MG/2ML IJ SOLN
4.0000 mg | Freq: Four times a day (QID) | INTRAMUSCULAR | Status: DC | PRN
  Administered 2019-02-17: 12:00:00 4 mg via INTRAVENOUS
  Filled 2019-02-17: qty 2

## 2019-02-17 MED ORDER — HYDRALAZINE HCL 20 MG/ML IJ SOLN: 5.0000 mg | INTRAMUSCULAR | Status: DC | PRN

## 2019-02-17 MED ORDER — LOSARTAN POTASSIUM 25 MG PO TABS
25.0000 mg | ORAL_TABLET | Freq: Every day | ORAL | Status: DC
  Administered 2019-02-17 – 2019-02-21 (×5): 25 mg via ORAL
  Filled 2019-02-17 (×5): qty 1

## 2019-02-17 MED ORDER — HYDROMORPHONE HCL 1 MG/ML IJ SOLN
0.5000 mg | INTRAMUSCULAR | Status: DC | PRN
  Administered 2019-02-17 (×4): 0.5 mg via INTRAVENOUS
  Filled 2019-02-17 (×4): qty 1

## 2019-02-17 MED ORDER — NITROGLYCERIN IN D5W 200-5 MCG/ML-% IV SOLN
0.0000 ug/min | INTRAVENOUS | Status: DC
  Administered 2019-02-17: 20 ug/min via INTRAVENOUS
  Administered 2019-02-17: 5 ug/min via INTRAVENOUS
  Administered 2019-02-17: 12:00:00 15 ug/min via INTRAVENOUS
  Administered 2019-02-17: 25 ug/min via INTRAVENOUS
  Administered 2019-02-17: 30 ug/min via INTRAVENOUS
  Filled 2019-02-17: qty 250

## 2019-02-17 MED ORDER — APIXABAN 5 MG PO TABS
5.0000 mg | ORAL_TABLET | Freq: Two times a day (BID) | ORAL | Status: DC
  Administered 2019-02-17 – 2019-02-21 (×9): 5 mg via ORAL
  Filled 2019-02-17 (×9): qty 1

## 2019-02-17 MED ORDER — DOFETILIDE 500 MCG PO CAPS
500.0000 ug | ORAL_CAPSULE | Freq: Two times a day (BID) | ORAL | Status: DC
  Administered 2019-02-17 – 2019-02-18 (×2): 500 ug via ORAL
  Filled 2019-02-17 (×3): qty 1

## 2019-02-17 MED ORDER — DOFETILIDE 500 MCG PO CAPS: 500.0000 ug | ORAL_CAPSULE | Freq: Two times a day (BID) | ORAL | Status: DC

## 2019-02-17 MED ORDER — POTASSIUM CHLORIDE CRYS ER 20 MEQ PO TBCR
40.0000 meq | EXTENDED_RELEASE_TABLET | ORAL | Status: AC
  Administered 2019-02-17 (×2): 40 meq via ORAL
  Filled 2019-02-17 (×2): qty 2

## 2019-02-17 MED ORDER — CO-ENZYME Q-10 50 MG PO CAPS: 100.0000 mg | ORAL_CAPSULE | Freq: Every day | ORAL | Status: DC

## 2019-02-17 MED ORDER — SODIUM CHLORIDE 0.9% FLUSH
3.0000 mL | Freq: Two times a day (BID) | INTRAVENOUS | Status: DC
  Administered 2019-02-17 – 2019-02-21 (×8): 3 mL via INTRAVENOUS

## 2019-02-17 MED ORDER — POTASSIUM CHLORIDE CRYS ER 20 MEQ PO TBCR: 40.0000 meq | EXTENDED_RELEASE_TABLET | ORAL | Status: DC

## 2019-02-17 MED ORDER — FUROSEMIDE 40 MG PO TABS
40.0000 mg | ORAL_TABLET | Freq: Every day | ORAL | Status: DC
  Administered 2019-02-17 – 2019-02-21 (×4): 40 mg via ORAL
  Filled 2019-02-17 (×5): qty 1

## 2019-02-17 MED ORDER — ASPIRIN 81 MG PO CHEW
324.0000 mg | CHEWABLE_TABLET | Freq: Once | ORAL | Status: AC
  Administered 2019-02-17: 324 mg via ORAL
  Filled 2019-02-17: qty 4

## 2019-02-17 MED ORDER — INSULIN ASPART 100 UNIT/ML ~~LOC~~ SOLN
0.0000 [IU] | Freq: Three times a day (TID) | SUBCUTANEOUS | Status: DC
  Administered 2019-02-17: 16:00:00 5 [IU] via SUBCUTANEOUS
  Administered 2019-02-17 – 2019-02-18 (×3): 2 [IU] via SUBCUTANEOUS
  Administered 2019-02-18: 07:00:00 1 [IU] via SUBCUTANEOUS
  Administered 2019-02-19: 12:00:00 3 [IU] via SUBCUTANEOUS
  Administered 2019-02-19: 07:00:00 1 [IU] via SUBCUTANEOUS
  Administered 2019-02-20: 16:00:00 2 [IU] via SUBCUTANEOUS
  Administered 2019-02-20: 07:00:00 1 [IU] via SUBCUTANEOUS

## 2019-02-17 MED ORDER — PRAVASTATIN SODIUM 10 MG PO TABS
20.0000 mg | ORAL_TABLET | Freq: Every day | ORAL | Status: DC
  Administered 2019-02-17 – 2019-02-20 (×4): 20 mg via ORAL
  Filled 2019-02-17 (×4): qty 2

## 2019-02-17 MED ORDER — INSULIN ASPART PROT & ASPART (70-30 MIX) 100 UNIT/ML ~~LOC~~ SUSP
15.0000 [IU] | Freq: Every day | SUBCUTANEOUS | Status: DC
  Administered 2019-02-17 – 2019-02-20 (×4): 15 [IU] via SUBCUTANEOUS
  Filled 2019-02-17: qty 10

## 2019-02-17 MED ORDER — NITROGLYCERIN 2 % TD OINT
1.0000 [in_us] | TOPICAL_OINTMENT | Freq: Once | TRANSDERMAL | Status: AC
  Administered 2019-02-17: 02:00:00 1 [in_us] via TOPICAL
  Filled 2019-02-17: qty 1

## 2019-02-17 MED ORDER — MAGNESIUM SULFATE IN D5W 1-5 GM/100ML-% IV SOLN
1.0000 g | Freq: Once | INTRAVENOUS | Status: AC
  Administered 2019-02-17: 1 g via INTRAVENOUS
  Filled 2019-02-17: qty 100

## 2019-02-17 MED ORDER — SODIUM CHLORIDE 0.9 % IV SOLN: 250.0000 mL | INTRAVENOUS | Status: DC | PRN

## 2019-02-17 MED ORDER — DILTIAZEM HCL ER COATED BEADS 240 MG PO CP24
240.0000 mg | ORAL_CAPSULE | Freq: Every day | ORAL | Status: DC
  Administered 2019-02-17 – 2019-02-21 (×5): 240 mg via ORAL
  Filled 2019-02-17 (×5): qty 1

## 2019-02-17 MED ORDER — INSULIN LISPRO PROT & LISPRO (75-25 MIX) 100 UNIT/ML KWIKPEN: 15.0000 [IU] | PEN_INJECTOR | SUBCUTANEOUS | Status: DC

## 2019-02-17 MED ORDER — COLCHICINE 0.6 MG PO TABS
0.6000 mg | ORAL_TABLET | Freq: Two times a day (BID) | ORAL | Status: DC
  Administered 2019-02-17 – 2019-02-21 (×9): 0.6 mg via ORAL
  Filled 2019-02-17 (×9): qty 1

## 2019-02-17 MED ORDER — SPIRONOLACTONE 25 MG PO TABS
25.0000 mg | ORAL_TABLET | Freq: Every day | ORAL | Status: DC
  Administered 2019-02-17 – 2019-02-21 (×5): 25 mg via ORAL
  Filled 2019-02-17 (×5): qty 1

## 2019-02-17 MED ORDER — METHOCARBAMOL 500 MG PO TABS
500.0000 mg | ORAL_TABLET | Freq: Three times a day (TID) | ORAL | Status: DC | PRN
  Administered 2019-02-20 (×2): 500 mg via ORAL
  Filled 2019-02-17 (×2): qty 1

## 2019-02-17 MED ORDER — METOPROLOL TARTRATE 25 MG PO TABS
25.0000 mg | ORAL_TABLET | Freq: Two times a day (BID) | ORAL | Status: DC
  Administered 2019-02-17 – 2019-02-21 (×9): 25 mg via ORAL
  Filled 2019-02-17 (×9): qty 1

## 2019-02-17 MED ORDER — SODIUM CHLORIDE 0.9% FLUSH: 3.0000 mL | INTRAVENOUS | Status: DC | PRN

## 2019-02-17 MED ORDER — NITROGLYCERIN 0.4 MG SL SUBL: 0.4000 mg | SUBLINGUAL_TABLET | SUBLINGUAL | Status: DC | PRN

## 2019-02-17 MED ORDER — INSULIN ASPART PROT & ASPART (70-30 MIX) 100 UNIT/ML ~~LOC~~ SUSP
25.0000 [IU] | Freq: Every day | SUBCUTANEOUS | Status: DC
  Administered 2019-02-18 – 2019-02-21 (×3): 25 [IU] via SUBCUTANEOUS
  Filled 2019-02-17: qty 10

## 2019-02-17 NOTE — Progress Notes (Signed)
Pt received from ED, AO x4. Breathing even and unlabored in RA. Chest pressure 5/10. Nitroglycerine titrated, see mAR. CHG bath completed, connected to tele. CCMD notified. Pt has Cardizem and nitro drip running. Oriented pt to room and call bell system. Will continue to monitor.

## 2019-02-17 NOTE — Progress Notes (Signed)
Pharmacy Review for Dofetilide (Tikosyn) Initiation  Admit Complaint: 63 y.o. female admitted 02/16/2019 with atrial fibrillation to be initiated on dofetilide.   Assessment:  Patient Exclusion Criteria: If any screening criteria checked as "Yes", then  patient  should NOT receive dofetilide until criteria item is corrected. If "Yes" please indicate correction plan.  YES  NO Patient  Exclusion Criteria Correction Plan  [x]  []  Baseline QTc interval is greater than or equal to 440 msec. IF above YES box checked dofetilide contraindicated unless patient has ICD; then may proceed if QTc 500-550 msec or with known ventricular conduction abnormalities may proceed with QTc 550-600 msec. QTc = 467 Per EP- may proceed  []  [x]  Magnesium level is less than 1.8 mEq/l : Last magnesium:  Lab Results  Component Value Date   MG 2.2 02/17/2019         [x]  []  Potassium level is less than 4 mEq/l : Last potassium:  Lab Results  Component Value Date   K 3.4 (L) 02/17/2019       -replacement ordered and a recheck in planned  []  [x]  Patient is known or suspected to have a digoxin level greater than 2 ng/ml: No results found for: DIGOXIN    []  [x]  Creatinine clearance less than 20 ml/min (calculated using Cockcroft-Gault, actual body weight and serum creatinine): Estimated Creatinine Clearance: 75.2 mL/min (A) (by C-G formula based on SCr of 1.02 mg/dL (H)).    [x]  []  Patient has received drugs known to prolong the QT intervals within the last 48 hours (phenothiazines, tricyclics or tetracyclic antidepressants, erythromycin, H-1 antihistamines, cisapride, fluoroquinolones, azithromycin). Drugs not listed above may have an, as yet, undetected potential to prolong the QT interval, updated information on QT prolonging agents is available at this website:QT prolonging agents -on Tizanidine PTA (possible risk).  No ordered as inpatient  Consider avoiding use upon discharge  []  [x]  Patient received a dose of  hydrochlorothiazide (Oretic) alone or in any combination including triamterene (Dyazide, Maxzide) in the last 48 hours.   []  [x]  Patient received a medication known to increase dofetilide plasma concentrations prior to initial dofetilide dose:  . Trimethoprim (Primsol, Proloprim) in the last 36 hours . Verapamil (Calan, Verelan) in the last 36 hours or a sustained release dose in the last 72 hours . Megestrol (Megace) in the last 5 days  . Cimetidine (Tagamet) in the last 6 hours . Ketoconazole (Nizoral) in the last 24 hours . Itraconazole (Sporanox) in the last 48 hours  . Prochlorperazine (Compazine) in the last 36 hours    []  [x]  Patient is known to have a history of torsades de pointes; congenital or acquired long QT syndromes.   []  [x]  Patient has received a Class 1 antiarrhythmic with less than 2 half-lives since last dose. (Disopyramide, Quinidine, Procainamide, Lidocaine, Mexiletine, Flecainide, Propafenone)   []  [x]  Patient has received amiodarone therapy in the past 3 months or amiodarone level is greater than 0.3 ng/ml.    Patient has been appropriately anticoagulated with apixaban.  Ordering provider was confirmed at [email or web address] if they are not listed on the [facility] Prescribers list.  Goal of Therapy: Follow renal function, electrolytes, potential drug interactions, and dose adjustment. Provide education and 1 week supply at discharge.  Plan:  [x]   Physician selected initial dose within range recommended for patients level of renal function - will monitor for response.  []   Physician selected initial dose outside of range recommended for patients level of renal function - will discuss  if the dose should be altered at this time.   Select One Calculated CrCl  Dose q12h  [x]  > 60 ml/min 500 mcg  []  40-60 ml/min 250 mcg  []  20-40 ml/min 125 mcg   2. Follow up QTc after the first 5 doses, renal function, electrolytes (K & Mg) daily x 3     days, dose  adjustment, success of initiation and facilitate 1 week discharge supply as     clinically indicated.  3. Initiate Tikosyn education video (Call [phone] and ask for Tikosyn Video # 116).  4. Place Enrollment Form on the chart for discharge supply of dofetilide.  Hildred Laser, PharmD Clinical Pharmacist **Pharmacist phone directory can now be found on [email or web address] ([email or web address]).  Listed under [facility].

## 2019-02-17 NOTE — Progress Notes (Signed)
Pt planned for Tikosyn start EKGs reviewed by MD, QTc OK to start K+  3.4  Replacement ordered and repeat potassium level ordered as well. Mag is OK Creat 1.02, cal CrCl using actual body weight is 101, will start at 52mcg dose  Pharmacy consulted Start Tiksoyn tonight once K+ allows  Tommye Standard, PA-C

## 2019-02-17 NOTE — H&P (Signed)
History and Physical    [name] NWG:[id number] DOB: 04-Oct-1956 DOA: 02/16/2019  Referring MD/NP/PA:   PCP: Glendale Chard, MD   Patient coming from:  The patient is coming from home.  At baseline, pt is independent for most of ADL.        Chief Complaint: Chest pain and palpitation  HPI: [name] is a 63 y.o. female with medical history significant of CAD, DES, hypertension, hyperlipidemia, diabetes mellitus, hypothyroidism, aortic insufficiency, atrial fibrillation on Eliquis, NSCL-IIIB (s/p of surgery, radiation and chemotherapy), diverticulitis, pulmonary fibrosis, pericardial effusion, pneumothorax, who presents with chest pain and palpitation.  Patient states that her symptoms started yesterday, including intermittent chest pain and heart racing. Her chest pain is located in the substernal area, sharp, 8 out of 10 in severity initially, currently 6 out of 10 in severity, nonradiating, pleuritic, aggravated by deep breath.  Patient denies recent long distance traveling.  No tenderness in calf areas.  She has mild shortness of breath, no cough, fever or chills.  Patient denies nausea, vomiting, diarrhea, abdominal pain, symptoms of UTI or unilateral weakness. Of note, she had drug-eluting stents to the circumflex and LAD in 2016 with patent stents noted on catheterization in February 2018.  Pt initially had A fib with RVR with HR up to 160s in ED. IV cardizem was started and she is converted to sinus rhythm, currently heart rate is 90-100s. She still has chest pain after converted to sinus rhythm.  ED Course: pt was found to have negative troponin, WBC 9.7, electrolytes renal function okay, magnesium 1.9, temperature 97.5, oxygen saturation 98% on room air.  Chest x-ray showed postsurgical change, cardiomegaly without infiltration.  Patient is placed on SDU for observation.  Review of Systems:   General: no fevers, chills, no body weight gain, has fatigue HEENT: no blurry vision,  hearing changes or sore throat Respiratory: has dyspnea, no coughing, wheezing CV: has chest pain, palpitations GI: no nausea, vomiting, abdominal pain, diarrhea, constipation GU: no dysuria, burning on urination, increased urinary frequency, hematuria  Ext: no leg edema Neuro: no unilateral weakness, numbness, or tingling, no vision change or hearing loss Skin: no rash, no skin tear. MSK: No muscle spasm, no deformity, no limitation of range of movement in spin Heme: No easy bruising.  Travel history: No recent long distant travel.  Allergy:  Allergies  Allergen Reactions  . Hydrocodone Hives and Itching  . Hydrocodone-Acetaminophen Hives  . Morphine And Related Other (See Comments)    GI PROBLEMS  . Rosuvastatin Other (See Comments)    Pt reports causes lower extremity muscle aches/cramping  . Lisinopril Cough  . Nsaids Other (See Comments)    GI PROBLEMS    Past Medical History:  Diagnosis Date  . Allergy   . Anemia   . Anemia   . Aortic insufficiency    a. Mod by echo 05/2015.  . Arthritis    "knees" (07/11/2015)  . Atrial fibrillation (HCC)    On Eliquis  . Carcinoma of hilus of lung ([place]) 01/20/2012   IIIB NSCL  Right hilar mass compressing esophagus/presenting with dysphagia Rx Surgery/RT/chemo dx January 2001  . Complication of anesthesia    "I had a hard time waking me up w/one of my shoulder surgeries"  . Coronary artery disease    a. Abnormal stress test -> LHc 06/2015 s/p DES to mCX and mLAD, residual D2 disease treated medially.  . Diverticulitis   . Elevated blood pressure   . Erythema nodosum   .  Heart murmur   . Insomnia   . lung ca dx'd 10/1999   chemo/xrt comp 05/29/2000  . Myalgia   . Nephrolithiasis 2015  . Obesity   . Pericardial effusion    a. 05/2015 Echo: EF 55-60%, no rwma, Gr 1 DD, no effusion - but an effusion was seen on CT which was felt to be increased in size compared to 12/2014 - pericardium also thickened.   . Pericarditis    a. Dx  05/2015.  [name] Pneumothorax, spontaneous, tension 01/20/2012   October, 2010  . Pulmonary fibrosis ([place]) 01/20/2012   Due to previous surgery and chest radiation for lung cancer  . Restrictive lung disease   . Type II diabetes mellitus ([place])     Past Surgical History:  Procedure Laterality Date  . ABDOMINAL HYSTERECTOMY  2008  . APPENDECTOMY  1969?  [name] CARDIAC CATHETERIZATION N/A 07/11/2015   Procedure: Left Heart Cath and Coronary Angiography;  Surgeon: Jettie Booze, MD;  Location: [facility];  Service: Cardiovascular;  Laterality: N/A;  . CARDIAC CATHETERIZATION  07/11/2015   Procedure: Coronary Stent Intervention;  Surgeon: Jettie Booze, MD;  Location: [facility];  Service: Cardiovascular;;  . CARPAL TUNNEL RELEASE Bilateral 1998-2005?   right-left  . CARPAL TUNNEL RELEASE Left 2009?   "for the 2nd time"  . COLONOSCOPY    . COLONOSCOPY WITH PROPOFOL N/A 05/28/2018   Procedure: COLONOSCOPY WITH PROPOFOL;  Surgeon: Milus Banister, MD;  Location: [facility];  Service: Endoscopy;  Laterality: N/A;  . CORONARY ANGIOPLASTY    . CYSTOSCOPY W/ STONE MANIPULATION  2013  . ESOPHAGOGASTRODUODENOSCOPY (EGD) WITH ESOPHAGEAL DILATION  2012  . FOOT SURGERY Bilateral 1980's   Callous removed   . INNER EAR SURGERY Right ~ 2009  . KNEE ARTHROPLASTY Right 1991  . KNEE ARTHROSCOPY  ~ 2000   LATERAL RELEASE  . LEFT HEART CATH AND CORONARY ANGIOGRAPHY N/A 12/18/2016   Procedure: Left Heart Cath and Coronary Angiography;  Surgeon: Burnell Blanks, MD;  Location: [facility];  Service: Cardiovascular;  Laterality: N/A;  . LUNG CANCER SURGERY Left 2001   "small cell"  . LUNG SURGERY Right 2007   "reinflated it"  . PULMONARY EMBOLISM SURGERY  2007   LUNG COLLAPSE  . RIGHT AND LEFT HEART CATH N/A 06/25/2017   Procedure: RIGHT AND LEFT HEART CATH;  Surgeon: Sherren Mocha, MD;  Location: [facility];  Service: Cardiovascular;  Laterality: N/A;  . SHOULDER  ADHESION RELEASE Right ~ 2004  . SHOULDER ARTHROSCOPY W/ ROTATOR CUFF REPAIR Right ~ 2003    Social History:  reports that she quit smoking about 19 years ago. Her smoking use included cigarettes. She has a 1.25 pack-year smoking history. She has never used smokeless tobacco. She reports that she does not drink alcohol or use drugs.  Family History:  Family History  Problem Relation Age of Onset  . Heart disease Father   . Diabetes Father   . Pancreatic cancer Mother   . Diabetes Mother   . Hypertension Sister        x 3  . Diabetes Sister        x 4  . Diabetes Maternal Grandmother   . Diabetes Paternal Grandmother   . Diabetes Sister   . Diabetes Sister   . Diabetes Sister   . Kidney disease Sister   . Colon cancer Neg Hx   . Colon polyps Neg Hx   . Rectal cancer  Neg Hx   . Stomach cancer Neg Hx   . Esophageal cancer Neg Hx   . Breast cancer Neg Hx      Prior to Admission medications   Medication Sig Start Date End Date Taking? Authorizing Provider  B-D UF III MINI PEN NEEDLES 31G X 5 MM MISC 2 (two) times daily. as directed 10/31/18   [provider]  co-enzyme Q-10 50 MG capsule TAKE 1 CAPSULE (50 MG TOTAL) BY MOUTH DAILY. 05/29/18   Dorothy Spark, MD  diclofenac (VOLTAREN) 75 MG EC tablet Take 75 mg by mouth 2 (two) times daily with a meal. 01/12/19   [provider]  diltiazem (CARDIZEM CD) 240 MG 24 hr capsule TAKE 1 CAPSULE BY MOUTH EVERY DAY 02/13/19   Dorothy Spark, MD  ELIQUIS 5 MG TABS tablet TAKE 1 TABLET BY MOUTH TWICE A DAY 12/02/18   Dorothy Spark, MD  furosemide (LASIX) 40 MG tablet TAKE 1 TABLET BY MOUTH EVERY DAY 08/26/18   Dorothy Spark, MD  HUMALOG MIX 75/25 KWIKPEN (75-25) 100 UNIT/ML Kwikpen Inject 30-40 Units into the skin See admin instructions. Inject 40 units SQ in Am and 30 units SQ in PM 12/22/15   [provider]  levothyroxine (SYNTHROID, LEVOTHROID) 50 MCG tablet Take 50 mcg by mouth daily before breakfast.   05/14/17   [provider]  losartan (COZAAR) 25 MG tablet TAKE 1 TABLET (25 MG TOTAL) BY MOUTH DAILY. 01/06/19   Dorothy Spark, MD  methocarbamol (ROBAXIN) 500 MG tablet Take 1 tablet (500 mg total) by mouth every 8 (eight) hours as needed for muscle spasms. 12/23/18   Glendale Chard, MD  metoprolol tartrate (LOPRESSOR) 25 MG tablet TAKE 1 TABLET (25 MG TOTAL) BY MOUTH 2 (TWO) TIMES DAILY. 08/14/18   Dorothy Spark, MD  nitroGLYCERIN (NITROSTAT) 0.4 MG SL tablet Place 1 tablet (0.4 mg total) under the tongue every 5 (five) minutes as needed for chest pain. 01/02/18   Rosita Fire, Brittainy M, PA-C  ONE TOUCH ULTRA TEST test strip USE AS DIRECTED 3 TIMES A DAY 10/04/18   [provider]  pravastatin (PRAVACHOL) 20 MG tablet TAKE 1 TABLET BY MOUTH EVERY DAY IN THE EVENING 11/13/18   Dorothy Spark, MD  Semaglutide,0.25 or 0.5MG /DOS, (OZEMPIC, 0.25 OR 0.5 MG/DOSE,) 2 MG/1.5ML SOPN Inject into the skin.    [provider]  spironolactone (ALDACTONE) 25 MG tablet Take 1 tablet (25 mg total) by mouth daily. 02/13/19   Dorothy Spark, MD  Vitamin D, Ergocalciferol, (DRISDOL) 50000 units CAPS capsule TAKE 1 CAPSULE BY MOUTH ON TUESDAY AND FRIDAY Patient not taking: Reported on 01/01/2019 07/27/18   Glendale Chard, MD    Physical Exam: Vitals:   02/17/19 0145 02/17/19 0150 02/17/19 0155 02/17/19 0200  BP: 135/69 126/72 121/70 131/70  Pulse: 92 97 97 95  Resp: 15 (!) 23 (!) 21 (!) 22  Temp:      TempSrc:      SpO2: 100% 98% 99% 97%  Weight:       General: Not in acute distress HEENT:       Eyes: PERRL, EOMI, no scleral icterus.       ENT: No discharge from the ears and nose, no pharynx injection, no tonsillar enlargement.        Neck: No JVD, no bruit, no mass felt. Heme: No neck lymph node enlargement. Cardiac: S1/S2, RRR, has murmurs, No gallops or rubs. Respiratory:  No rales, wheezing, rhonchi  or rubs. GI: Soft, nondistended, nontender, no rebound pain, no  organomegaly, BS present. GU: No hematuria Ext: No pitting leg edema bilaterally. 2+DP/PT pulse bilaterally. Musculoskeletal: No joint deformities, No joint redness or warmth, no limitation of ROM in spin. Skin: No rashes.  Neuro: Alert, oriented X3, cranial nerves II-XII grossly intact, moves all extremities normally.  Psych: Patient is not psychotic, no suicidal or hemocidal ideation.  Labs on Admission: I have personally reviewed following labs and imaging studies  CBC: Recent Labs  Lab 02/16/19 2326  WBC 9.7  HGB 11.9*  HCT 36.5  MCV 97.1  PLT 812   Basic Metabolic Panel: Recent Labs  Lab 02/16/19 2326 02/16/19 2341  NA 139  --   K 3.6  --   CL 102  --   CO2 23  --   GLUCOSE 244*  --   BUN 21*  --   CREATININE 0.94  --   CALCIUM 9.1  --   MG  --  1.9   GFR: Estimated Creatinine Clearance: 79.4 mL/min (by C-G formula based on SCr of 0.94 mg/dL). Liver Function Tests: No results for input(s): AST, ALT, ALKPHOS, BILITOT, PROT, ALBUMIN in the last 168 hours. No results for input(s): LIPASE, AMYLASE in the last 168 hours. No results for input(s): AMMONIA in the last 168 hours. Coagulation Profile: No results for input(s): INR, PROTIME in the last 168 hours. Cardiac Enzymes: Recent Labs  Lab 02/16/19 2326  TROPONINI <0.03   BNP (last 3 results) No results for input(s): PROBNP in the last 8760 hours. HbA1C: No results for input(s): HGBA1C in the last 72 hours. CBG: No results for input(s): GLUCAP in the last 168 hours. Lipid Profile: No results for input(s): CHOL, HDL, LDLCALC, TRIG, CHOLHDL, LDLDIRECT in the last 72 hours. Thyroid Function Tests: No results for input(s): TSH, T4TOTAL, FREET4, T3FREE, THYROIDAB in the last 72 hours. Anemia Panel: No results for input(s): VITAMINB12, FOLATE, FERRITIN, TIBC, IRON, RETICCTPCT in the last 72 hours. Urine analysis:    Component Value Date/Time   COLORURINE YELLOW 09/01/2018 0804   APPEARANCEUR CLEAR 09/01/2018  0804   LABSPEC 1.017 09/01/2018 0804   PHURINE 5.0 09/01/2018 0804   GLUCOSEU NEGATIVE 09/01/2018 0804   GLUCOSEU NEGATIVE 08/14/2007 1248   HGBUR NEGATIVE 09/01/2018 0804   BILIRUBINUR NEGATIVE 09/01/2018 0804   BILIRUBINUR Negative 08/26/2018 1610   KETONESUR NEGATIVE 09/01/2018 0804   PROTEINUR NEGATIVE 09/01/2018 0804   UROBILINOGEN 1.0 08/26/2018 1610   UROBILINOGEN 0.2 11/04/2012 1622   NITRITE NEGATIVE 09/01/2018 0804   LEUKOCYTESUR NEGATIVE 09/01/2018 0804   Sepsis Labs: @LABRCNTIP (procalcitonin:4,lacticidven:4) )No results found for this or any previous visit (from the past 240 hour(s)).   Radiological Exams on Admission: Dg Chest Port 1 View  Result Date: 02/17/2019 CLINICAL DATA:  Initial evaluation for acute chest pain. EXAM: PORTABLE CHEST 1 VIEW COMPARISON:  Prior radiograph from 12/29/2018 FINDINGS: Cardiomegaly with prominence of the right atrial contour, stable. Coronary stent in place. Mediastinal silhouette within normal limits. Elevation of the left hemidiaphragm with postsurgical changes at the left lung apex, stable. Additional postsurgical changes present at the right upper lobe. No focal infiltrates. No edema or effusion. No visible pneumothorax. No acute osseous finding. IMPRESSION: 1. No active cardiopulmonary disease. 2. Chronic postsurgical changes involving the upper lobes bilaterally, left greater than right, stable. 3. Cardiomegaly, unchanged. Electronically Signed   By: Jeannine Boga M.D.   On: 02/17/2019 00:04     EKG: Independently reviewed.  First EKG showed atrial fibrillation  with RVR, QTC 489.  The repeated EKG after started Cardizem drip, showed QTC 449, sinus rhythm, early R wave progression.  Assessment/Plan Principal Problem:   Chest pain Active Problems:   Controlled type 2 diabetes mellitus without complication, with long-term current use of insulin (HCC)   Hypertension, essential   CAD S/P percutaneous coronary angioplasty    Hyperlipidemia   Atrial fibrillation with RVR (HCC)   Chronic diastolic CHF (congestive heart failure) (HCC)   Non-small cell lung cancer (HCC)   Hypothyroidism   Chest pain and hx of CAD: she had drug-eluting stents to the circumflex and LAD in 2016 with patent stents noted on catheterization in February 2018. Now pt has pleuritic chest pain.  Patient is on Eliquis and has no oxygen desaturation. I have low suspicions for PE.  Given history of pericardial effusion, will need to rule out this possibility.  - will place on SDU bed for obs - cycle CE q6 x3 and repeat EKG in the am - on NTG gtt now - prn dilaudid for pain - received 324 mg of ASA in ED-->pt is on Eliquis  - on pravastatin - Risk factor stratification: will check FLP and A1C, UDS - 2d echo - did not order 2d echo - please call Card in AM - inpt card consult was requested via Epic  Atrial fibrillation with RVR ([place]): Pt initially had A fib with RVR with HR up to 160s in ED. IV cardizem was started and she is converted to sinus rhythm, currently heart rate is 90-100s. CHA2DS2-VASc Score is 5, needs oral anticoagulation. Patient is Eliquis at home.  -on cardizem gtt now -continue home Cardizem and metoprolol -Continue Eliquis -check TSH - will give 1 g of magnesium sulfate (his magnesium is 1.9)  Controlled type 2 diabetes mellitus without complication, with long-term current use of insulin ([place]): Last A1c 7.4 on 12/23/18, not well controled. Patient is taking 75/25 insulin and ozempic injection at home -will decrease 75/25 insulin from  40-30 Units bid to 25-15 units bid -SSI  HTN:  -Continue home medications: Cardizem, Cozaar, metoprolol -Patient is also on Lasix and spironolactone -IV hydralazine prn  Hyperlipidemia: -Pravastatin  Chronic diastolic CHF (congestive heart failure) ([place]): Patient does not have leg edema JVD.  Chest x-ray has no pulmonary edema.  CHF is compensated. -Continue Lasix and spironolactone  To check BNp  Non-small cell lung cancer [facility]): s/p of surgery, radiation and chemotherapy.  Patient used to follow-up with Dr. Julien Nordmann and Beryle Beams, but did not follow-up with oncology recently. -May need to follow-up with oncology again  Hypothyroidism: Last TSH was 1.74 on 12/23/2018 -Continue Synthroid   DVT ppx: on Eliquis Code Status: Full code Family Communication: None at bed side.   Disposition Plan:  Anticipate discharge back to previous home environment Consults called:  EDP consulted Dr. Paticia Stack of card Admission status: SDU/obs     Date of Service 02/17/2019    [facility]   If 7PM-7AM, please contact night-coverage [email or web address] Password [email or web address] 02/17/2019, 2:23 AM

## 2019-02-17 NOTE — ED Notes (Signed)
ED TO INPATIENT HANDOFF REPORT  ED Nurse Name and Phone #: Nicki Reaper [phone]  S Name/Age/Gender [name] 63 y.o. female Room/Bed: TRAAC/TRAAC  Code Status   Code Status: Full Code  Home/SNF/Other Home Patient oriented to: self, place, time and situation Is this baseline? Yes   Triage Complete: Triage complete  Chief Complaint CP  Triage Note Pt states that she thinks that she is having rapid a-fib.  Pt states that she began having CP yesterday but it subsided and today around 6pm she began to feel her heart racing and she is having Cp and a little bit of sob.    Allergies Allergies  Allergen Reactions  . Hydrocodone Hives and Itching  . Hydrocodone-Acetaminophen Hives  . Morphine And Related Other (See Comments)    GI PROBLEMS  . Rosuvastatin Other (See Comments)    Pt reports causes lower extremity muscle aches/cramping  . Lisinopril Cough  . Nsaids Other (See Comments)    GI PROBLEMS    Level of Care/Admitting Diagnosis ED Disposition    ED Disposition Condition Comment   Admit  Hospital Area: [facility] [[id number]]  Level of Care: Progressive [102]  I expect the patient will be discharged within 24 hours: No (not a candidate for 5C-Observation unit)  Covid Evaluation: N/A  Diagnosis: Chest pain [295188]  Admitting Physician: Ivor Costa [[id number]]  Attending Physician: Ivor Costa [[id number]]  PT Class (Do Not Modify): Observation [104]  PT Acc Code (Do Not Modify): Observation [10022]       B Medical/Surgery History Past Medical History:  Diagnosis Date  . Allergy   . Anemia   . Anemia   . Aortic insufficiency    a. Mod by echo 05/2015.  . Arthritis    "knees" (07/11/2015)  . Atrial fibrillation (HCC)    On Eliquis  . 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 Complication of anesthesia    "I had a hard time waking me up  w/one of my shoulder surgeries"  . Coronary artery disease    a. Abnormal stress test -> LHc 06/2015 s/p DES to mCX and mLAD, residual D2 disease treated medially.  . Diverticulitis   . Elevated blood pressure   . Erythema nodosum   . Heart murmur   . Insomnia   . lung ca dx'd 10/1999   chemo/xrt comp 05/29/2000  . Myalgia   . Nephrolithiasis 2015  . Obesity   . Pericardial effusion    a. 05/2015 Echo: EF 55-60%, no rwma, Gr 1 DD, no effusion - but an effusion was seen on CT which was felt to be increased in size compared to 12/2014 - pericardium also thickened.   . Pericarditis    a. Dx 05/2015.  [name] Pneumothorax, spontaneous, tension 01/20/2012   October, 2010  . Pulmonary fibrosis ([place]) 01/20/2012   Due to previous surgery and chest radiation for lung cancer  . Restrictive lung disease   . Type II diabetes mellitus ([place])    Past Surgical History:  Procedure Laterality Date  . ABDOMINAL HYSTERECTOMY  2008  . APPENDECTOMY  1969?  [name] CARDIAC CATHETERIZATION N/A 07/11/2015   Procedure: Left Heart Cath and Coronary Angiography;  Surgeon: Jettie Booze, MD;  Location: [place] [facility];  Service: Cardiovascular;  Laterality: N/A;  . CARDIAC CATHETERIZATION  07/11/2015   Procedure: Coronary Stent Intervention;  Surgeon: Jettie Booze,  MD;  Location: [place] [facility];  Service: Cardiovascular;;  . CARPAL TUNNEL RELEASE Bilateral 1998-2005?   right-left  . CARPAL TUNNEL RELEASE Left 2009?   "for the 2nd time"  . COLONOSCOPY    . COLONOSCOPY WITH PROPOFOL N/A 05/28/2018   Procedure: COLONOSCOPY WITH PROPOFOL;  Surgeon: Milus Banister, MD;  Location: [facility];  Service: Endoscopy;  Laterality: N/A;  . CORONARY ANGIOPLASTY    . CYSTOSCOPY W/ STONE MANIPULATION  2013  . ESOPHAGOGASTRODUODENOSCOPY (EGD) WITH ESOPHAGEAL DILATION  2012  . FOOT SURGERY Bilateral 1980's   Callous removed   . INNER EAR SURGERY Right ~ 2009  . KNEE ARTHROPLASTY Right 1991  . KNEE ARTHROSCOPY  ~ 2000    LATERAL RELEASE  . LEFT HEART CATH AND CORONARY ANGIOGRAPHY N/A 12/18/2016   Procedure: Left Heart Cath and Coronary Angiography;  Surgeon: Burnell Blanks, MD;  Location: [facility];  Service: Cardiovascular;  Laterality: N/A;  . LUNG CANCER SURGERY Left 2001   "small cell"  . LUNG SURGERY Right 2007   "reinflated it"  . PULMONARY EMBOLISM SURGERY  2007   LUNG COLLAPSE  . RIGHT AND LEFT HEART CATH N/A 06/25/2017   Procedure: RIGHT AND LEFT HEART CATH;  Surgeon: Sherren Mocha, MD;  Location: [facility];  Service: Cardiovascular;  Laterality: N/A;  . SHOULDER ADHESION RELEASE Right ~ 2004  . SHOULDER ARTHROSCOPY W/ ROTATOR CUFF REPAIR Right ~ 2003     A IV Location/Drains/Wounds Patient Lines/Drains/Airways Status   Active Line/Drains/Airways    Name:   Placement date:   Placement time:   Site:   Days:   Peripheral IV 02/16/19 Left Antecubital   02/16/19    2341    Antecubital   1          Intake/Output Last 24 hours No intake or output data in the 24 hours ending 02/17/19 0229  Labs/Imaging Results for orders placed or performed during the hospital encounter of 02/16/19 (from the past 48 hour(s))  I-Stat beta hCG blood, ED     Status: None   Collection Time: 02/16/19 11:25 PM  Result Value Ref Range   I-stat hCG, quantitative <5.0 <5 mIU/mL   Comment 3            Comment:   GEST. AGE      CONC.  (mIU/mL)   <=1 WEEK        5 - 50     2 WEEKS       50 - 500     3 WEEKS       100 - 10,000     4 WEEKS     1,000 - 30,000        FEMALE AND NON-PREGNANT FEMALE:     LESS THAN 5 mIU/mL   Basic metabolic panel     Status: Abnormal   Collection Time: 02/16/19 11:26 PM  Result Value Ref Range   Sodium 139 135 - 145 mmol/L   Potassium 3.6 3.5 - 5.1 mmol/L   Chloride 102 98 - 111 mmol/L   CO2 23 22 - 32 mmol/L   Glucose, Bld 244 (H) 70 - 99 mg/dL   BUN 21 (H) 6 - 20 mg/dL   Creatinine, Ser 0.94 0.44 - 1.00 mg/dL   Calcium 9.1 8.9 - 10.3 mg/dL   GFR  calc non Af Amer >60 >60 mL/min   GFR calc Af Amer >60 >60 mL/min   Anion gap 14 5 -  15    Comment: Performed at [facility], [place] [address] [place], [place] [address]  CBC     Status: Abnormal   Collection Time: 02/16/19 11:26 PM  Result Value Ref Range   WBC 9.7 4.0 - 10.5 K/uL   RBC 3.76 (L) 3.87 - 5.11 MIL/uL   Hemoglobin 11.9 (L) 12.0 - 15.0 g/dL   HCT 36.5 36.0 - 46.0 %   MCV 97.1 80.0 - 100.0 fL   MCH 31.6 26.0 - 34.0 pg   MCHC 32.6 30.0 - 36.0 g/dL   RDW 11.9 11.5 - 15.5 %   Platelets 329 150 - 400 K/uL   nRBC 0.0 0.0 - 0.2 %    Comment: Performed at [facility], [place] [address] [place], [place] [address]  Troponin I - ONCE - STAT     Status: None   Collection Time: 02/16/19 11:26 PM  Result Value Ref Range   Troponin I <0.03 <0.03 ng/mL    Comment: Performed at [facility], [place] [address] [place], [place] [address]  Magnesium     Status: None   Collection Time: 02/16/19 11:41 PM  Result Value Ref Range   Magnesium 1.9 1.7 - 2.4 mg/dL    Comment: Performed at [facility], [place] [address] [place], [place] [address]   Dg Chest Port 1 View  Result Date: 02/17/2019 CLINICAL DATA:  Initial evaluation for acute chest pain. EXAM: PORTABLE CHEST 1 VIEW COMPARISON:  Prior radiograph from 12/29/2018 FINDINGS: Cardiomegaly with prominence of the right atrial contour, stable. Coronary stent in place. Mediastinal silhouette within normal limits. Elevation of the left hemidiaphragm with postsurgical changes at the left lung apex, stable. Additional postsurgical changes present at the right upper lobe. No focal infiltrates. No edema or effusion. No visible pneumothorax. No acute osseous finding. IMPRESSION: 1. No active cardiopulmonary disease. 2. Chronic postsurgical changes involving the upper lobes bilaterally, left greater than right, stable. 3. Cardiomegaly, unchanged. Electronically Signed   By: Jeannine Boga M.D.   On: 02/17/2019 00:04    Pending  Labs Unresulted Labs (From admission, onward)    Start     Ordered   02/17/19 0500  Hemoglobin A1c  Tomorrow morning,   R     02/17/19 0156   02/17/19 0500  Lipid panel  Tomorrow morning,   R    Comments:  Please obtain as a fasting lipid panel - should not have eaten/ drank food for 8 hours prior to labs.    02/17/19 0156   02/17/19 0500  TSH  Tomorrow morning,   R     02/17/19 0156   02/17/19 0214  Rapid urine drug screen (hospital performed)  Once,   R     02/17/19 0213   02/17/19 0158  HIV antibody (Routine Testing)  Once,   R     02/17/19 0158   02/17/19 0157  Brain natriuretic peptide  Once,   R     02/17/19 0156   02/17/19 0156  Troponin I - Now Then Q6H  Now then every 6 hours,   R     02/17/19 0156          Vitals/Pain Today's Vitals   02/17/19 0145 02/17/19 0150 02/17/19 0155 02/17/19 0200  BP: 135/69 126/72 121/70 131/70  Pulse: 92 97 97 95  Resp: 15 (!) 23 (!) 21 (!) 22  Temp:      TempSrc:      SpO2: 100%  98% 99% 97%  Weight:      PainSc:        Isolation Precautions No active isolations  Medications Medications  diltiazem (CARDIZEM) 1 mg/mL load via infusion 15 mg (15 mg Intravenous Bolus from Bag 02/17/19 0005)    And  diltiazem (CARDIZEM) 100 mg in dextrose 5% 116mL (1 mg/mL) infusion (5 mg/hr Intravenous New Bag/Given 02/17/19 0007)  nitroGLYCERIN 50 mg in dextrose 5 % 250 mL (0.2 mg/mL) infusion (20 mcg/min Intravenous Rate/Dose Change 02/17/19 0223)  HYDROmorphone (DILAUDID) injection 0.5 mg (0.5 mg Intravenous Given 02/17/19 0229)  magnesium sulfate IVPB 1 g 100 mL (1 g Intravenous New Bag/Given 02/17/19 0222)  insulin aspart (novoLOG) injection 0-9 Units (has no administration in time range)  ondansetron (ZOFRAN) injection 4 mg (has no administration in time range)  hydrALAZINE (APRESOLINE) injection 5 mg (has no administration in time range)  sodium chloride flush (NS) 0.9 % injection 3 mL (3 mLs Intravenous Given 02/16/19 2342)  nitroGLYCERIN  (NITROGLYN) 2 % ointment 1 inch (1 inch Topical Given 02/17/19 0138)  aspirin chewable tablet 324 mg (324 mg Oral Given 02/17/19 0138)    Mobility walks Low fall risk   Focused Assessments Cardiac Assessment Handoff:  Cardiac Rhythm: Atrial fibrillation Lab Results  Component Value Date   TROPONINI <0.03 02/16/2019   Lab Results  Component Value Date   DDIMER 0.71 (H) 05/30/2015   Does the Patient currently have chest pain? Yes     R Recommendations: See Admitting Provider Note  Report given to:   Additional Notes:

## 2019-02-17 NOTE — Progress Notes (Addendum)
PROGRESS NOTE    [name]  KPT:[id number] DOB: 10-06-1956 DOA: 02/16/2019 PCP: Glendale Chard, MD  Brief Narrative: 63 year old female with history of CAD, history of stenting to circumflex and LAD in 9/201 6, history of chronic pericardial effusion, paroxysmal atrial fibrillation on Eliquis, chronic diastolic CHF, diabetes, hypertension, remote history of non-small cell lung cancer treated surgically, pulmonary fibrosis, COPD, chronic anemia presented to the emergency room with atypical chest pain with some pressure-like and pleuritic discomfort in her chest, associated with atrial fibrillation with rapid ventricular response   Assessment & Plan:   Chest pain -No evidence of ACS at this time, history of CAD with mid circumflex and LAD stent in 9/201 9 -Last cath 2/201 8 with patent stents, current symptoms not consistent with ischemic chest pain, troponin negative x3 as well -Continue beta-blocker statin, not on aspirin due to concurrent Eliquis -Consult cardiology, follow-up 2D echocardiogram to assess pericardium -Wean off nitro drip  Atrial fibrillation with rapid ventricular response -Converted to sinus rhythm with IV Cardizem -Restarted oral Cardizem, continue metoprolol -Cardiology considering alternate antiarrhythmic, poor candidate for amiodarone due to history of pulmonary fibrosis, EP consulted -Continue Eliquis  History of pericardial effusion -Intermittently treated in the past with colchicine -Follow-up repeat echo  History of CAD -As above  Chronic diastolic CHF -He appears euvolemic at this time, continue Lasix and Aldactone  Remote history of non-small cell lung cancer -Treated 20 years ago with surgery chemo and XRT  Type 2 diabetes mellitus -A1c is 7.0 -Continue insulin 75/25, lower dose from baseline, CBGs are stable    Hypothyroidism -Continue Synthroid, TSH is within normal limits  DVT prophylaxis: Eliquis Code Status: Full code Family  Communication: No family at bedside Disposition Plan: Home pending above work-up  Consultants:   Cardiology   Procedures:   Antimicrobials:    Subjective: -Feels better, chest discomfort improved, denies any dyspnea, still has some mild twinges in her chest  Objective: Vitals:   02/17/19 0700 02/17/19 0751 02/17/19 0752 02/17/19 1200  BP:   135/73 (!) 147/92  Pulse: 93 73 86 78  Resp: 17 18 (!) 26 (!) 21  Temp:   (!) 97.5 F (36.4 C)   TempSrc:   Oral   SpO2: 98% 97% 96% 97%  Weight:      Height:        Intake/Output Summary (Last 24 hours) at 02/17/2019 1240 Last data filed at 02/17/2019 0751 Gross per 24 hour  Intake 139.54 ml  Output -  Net 139.54 ml   Filed Weights   02/16/19 2321 02/17/19 0306  Weight: 105.2 kg 108.7 kg    Examination:  General exam: Appears calm and comfortable, no distress Respiratory system: Clear bilaterally Cardiovascular system: S1 & S2 heard, RRR  Gastrointestinal system: Abdomen is nondistended, soft and nontender.Normal bowel sounds heard. Central nervous system: Alert and oriented. No focal neurological deficits. Extremities: No edema Skin: No rashes, lesions or ulcers Psychiatry: Judgement and insight appear normal. Mood & affect appropriate.     Data Reviewed:   CBC: Recent Labs  Lab 02/16/19 2326  WBC 9.7  HGB 11.9*  HCT 36.5  MCV 97.1  PLT 170   Basic Metabolic Panel: Recent Labs  Lab 02/16/19 2326 02/16/19 2341  NA 139  --   K 3.6  --   CL 102  --   CO2 23  --   GLUCOSE 244*  --   BUN 21*  --   CREATININE 0.94  --   CALCIUM  9.1  --   MG  --  1.9   GFR: Estimated Creatinine Clearance: 81.6 mL/min (by C-G formula based on SCr of 0.94 mg/dL). Liver Function Tests: No results for input(s): AST, ALT, ALKPHOS, BILITOT, PROT, ALBUMIN in the last 168 hours. No results for input(s): LIPASE, AMYLASE in the last 168 hours. No results for input(s): AMMONIA in the last 168 hours. Coagulation Profile: No  results for input(s): INR, PROTIME in the last 168 hours. Cardiac Enzymes: Recent Labs  Lab 02/16/19 2326 02/17/19 0214 02/17/19 0704  TROPONINI <0.03 <0.03 <0.03   BNP (last 3 results) No results for input(s): PROBNP in the last 8760 hours. HbA1C: Recent Labs    02/17/19 0215  HGBA1C 7.0*   CBG: Recent Labs  Lab 02/17/19 0623 02/17/19 1120  GLUCAP 143* 123*   Lipid Profile: Recent Labs    02/17/19 0215  CHOL 121  HDL 39*  LDLCALC 65  TRIG 87  CHOLHDL 3.1   Thyroid Function Tests: Recent Labs    02/17/19 0215  TSH 2.471   Anemia Panel: No results for input(s): VITAMINB12, FOLATE, FERRITIN, TIBC, IRON, RETICCTPCT in the last 72 hours. Urine analysis:    Component Value Date/Time   COLORURINE YELLOW 09/01/2018 0804   APPEARANCEUR CLEAR 09/01/2018 0804   LABSPEC 1.017 09/01/2018 0804   PHURINE 5.0 09/01/2018 0804   GLUCOSEU NEGATIVE 09/01/2018 0804   GLUCOSEU NEGATIVE 08/14/2007 1248   HGBUR NEGATIVE 09/01/2018 0804   BILIRUBINUR NEGATIVE 09/01/2018 0804   BILIRUBINUR Negative 08/26/2018 1610   KETONESUR NEGATIVE 09/01/2018 0804   PROTEINUR NEGATIVE 09/01/2018 0804   UROBILINOGEN 1.0 08/26/2018 1610   UROBILINOGEN 0.2 11/04/2012 1622   NITRITE NEGATIVE 09/01/2018 0804   LEUKOCYTESUR NEGATIVE 09/01/2018 0804   Sepsis Labs: @LABRCNTIP (procalcitonin:4,lacticidven:4)  )No results found for this or any previous visit (from the past 240 hour(s)).       Radiology Studies: Dg Chest Port 1 View  Result Date: 02/17/2019 CLINICAL DATA:  Initial evaluation for acute chest pain. EXAM: PORTABLE CHEST 1 VIEW COMPARISON:  Prior radiograph from 12/29/2018 FINDINGS: Cardiomegaly with prominence of the right atrial contour, stable. Coronary stent in place. Mediastinal silhouette within normal limits. Elevation of the left hemidiaphragm with postsurgical changes at the left lung apex, stable. Additional postsurgical changes present at the right upper lobe. No focal  infiltrates. No edema or effusion. No visible pneumothorax. No acute osseous finding. IMPRESSION: 1. No active cardiopulmonary disease. 2. Chronic postsurgical changes involving the upper lobes bilaterally, left greater than right, stable. 3. Cardiomegaly, unchanged. Electronically Signed   By: Jeannine Boga M.D.   On: 02/17/2019 00:04        Scheduled Meds: . apixaban  5 mg Oral BID  . diltiazem  240 mg Oral Daily  . furosemide  40 mg Oral Daily  . insulin aspart  0-9 Units Subcutaneous TID WC  . insulin aspart protamine- aspart  15 Units Subcutaneous Q supper  . insulin aspart protamine- aspart  25 Units Subcutaneous Q breakfast  . losartan  25 mg Oral Daily  . metoprolol tartrate  25 mg Oral BID  . pravastatin  20 mg Oral q1800  . spironolactone  25 mg Oral Daily   Continuous Infusions:   LOS: 0 days    Time spent: 75min    Domenic Polite, MD [facility]  02/17/2019, 12:40 PM

## 2019-02-17 NOTE — TOC Benefit Eligibility Note (Signed)
Transition of Care (TOC) Benefit Eligibility Note    Patient Details  Name: [name] MRN: [id number] Date of Birth: 12/18/1955   Medication/Dose: TIKOSYN 500 MCG BID  Covered?: Yes  Tier: 3 Drug  Prescription Coverage Preferred Pharmacy: YES([facility]  OR [facility] )  Spoke with Person/Company/Phone Number:: CALESHA([employer] # [phone])  Co-Pay: $ 61.080  Prior Approval: No  Deductible: Met  Additional Notes: DOFETILIDE  500 MCG BID(COVER- YES, CO-PYA- $ 61.80 TIER- 3 DRUG P/A -NO)    [name], Dora Phone Number: 02/17/2019, 5:16 PM     

## 2019-02-17 NOTE — Consult Note (Signed)
Cardiology Consultation:   Patient ID: [name] MRN: [id number]; DOB: 1955-11-27  Admit date: 02/16/2019 Date of Consult: 02/17/2019  Primary Care Provider: Glendale Chard, MD Primary Cardiologist: Ena Dawley, MD  Primary Electrophysiologist:  None    Patient Profile:   [name] is a 63 y.o. female with a hx of CAD (DES to [facility] and mLAD 07/11/15 >> patent stents on repeat cath 11/2016), chronic pericardial effusion, chronic pericarditis and pericardial rub, treated intermittently with steroids and colchicine, chronic CHF (diastolic), DM, HTN, remote NSCLC (tx surgically and chemo), COPD, pulmonary fibrosis (follows with Dr. Elsworth Soho) VHD with mild AI, and AFib who is being seen today for the evaluation of AFib at the request of Dr. Burt Knack.  History of Present Illness:   [name] last saw Dr. Emelia Salisbury of this year, felt to be stable, without any significant or concerning symptomatology, no changes were made.  She was admitted to [facility] today with c/o CP, palpitations on/off for a week.  She was found in AF w/RVR, started on dilt gtt in the ER with spontaneous conversion to SR, she continued with some CP that eventually improved with dilaudid and NTG gtt, cardiology was consulted to her case.  CP not felt to be c/w angina, Trop have been negative (x3), LVEF by echo today hyperdynamic >65%, NO pericardial effusion.  Planned for PO dilt and weaning off NTG gtt, adding colchicine.  EP is asked to evaluate for rhythm control/AAD options/recommendations.  ON: Eliquis, appropriately dosed dilt gtt >> 240mg  PO (same at home) Lopressor 25mg  BID (same at home)  LABS K+ 3.6 Mag 1.9 BUN/Creat 21/0.94 Trop I: 0.03 (x3) BNP 46 WBC 9.7 H/H 11/36 Plts 329 TSH 2.471  Past Medical History:  Diagnosis Date  . Allergy   . Anemia   . Anemia   . Aortic insufficiency    a. Mod by echo 05/2015.  . Arthritis    "knees" (07/11/2015)  . Atrial fibrillation (HCC)    On Eliquis  .  Carcinoma of hilus of lung ([place]) 01/20/2012   IIIB NSCL  Right hilar mass compressing esophagus/presenting with dysphagia Rx Surgery/RT/chemo dx January 2001  . Complication of anesthesia    "I had a hard time waking me up w/one of my shoulder surgeries"  . Coronary artery disease    a. Abnormal stress test -> LHc 06/2015 s/p DES to mCX and mLAD, residual D2 disease treated medially.  . Diverticulitis   . Elevated blood pressure   . Erythema nodosum   . Heart murmur   . Insomnia   . lung ca dx'd 10/1999   chemo/xrt comp 05/29/2000  . Myalgia   . Nephrolithiasis 2015  . Obesity   . Pericardial effusion    a. 05/2015 Echo: EF 55-60%, no rwma, Gr 1 DD, no effusion - but an effusion was seen on CT which was felt to be increased in size compared to 12/2014 - pericardium also thickened.   . Pericarditis    a. Dx 05/2015.  [name] Pneumothorax, spontaneous, tension 01/20/2012   October, 2010  . Pulmonary fibrosis ([place]) 01/20/2012   Due to previous surgery and chest radiation for lung cancer  . Restrictive lung disease   . Type II diabetes mellitus ([place])     Past Surgical History:  Procedure Laterality Date  . ABDOMINAL HYSTERECTOMY  2008  . APPENDECTOMY  1969?  [name] CARDIAC CATHETERIZATION N/A 07/11/2015   Procedure: Left Heart Cath and Coronary Angiography;  Surgeon: Jettie Booze, MD;  Location: [place] [facility];  Service: Cardiovascular;  Laterality: N/A;  . CARDIAC CATHETERIZATION  07/11/2015   Procedure: Coronary Stent Intervention;  Surgeon: Jettie Booze, MD;  Location: [place] [facility];  Service: Cardiovascular;;  . CARPAL TUNNEL RELEASE Bilateral 1998-2005?   right-left  . CARPAL TUNNEL RELEASE Left 2009?   "for the 2nd time"  . COLONOSCOPY    . COLONOSCOPY WITH PROPOFOL N/A 05/28/2018   Procedure: COLONOSCOPY WITH PROPOFOL;  Surgeon: Milus Banister, MD;  Location: [facility];  Service: Endoscopy;  Laterality: N/A;  . CORONARY ANGIOPLASTY    . CYSTOSCOPY W/ STONE MANIPULATION   2013  . ESOPHAGOGASTRODUODENOSCOPY (EGD) WITH ESOPHAGEAL DILATION  2012  . FOOT SURGERY Bilateral 1980's   Callous removed   . INNER EAR SURGERY Right ~ 2009  . KNEE ARTHROPLASTY Right 1991  . KNEE ARTHROSCOPY  ~ 2000   LATERAL RELEASE  . LEFT HEART CATH AND CORONARY ANGIOGRAPHY N/A 12/18/2016   Procedure: Left Heart Cath and Coronary Angiography;  Surgeon: Burnell Blanks, MD;  Location: [place] [facility];  Service: Cardiovascular;  Laterality: N/A;  . LUNG CANCER SURGERY Left 2001   "small cell"  . LUNG SURGERY Right 2007   "reinflated it"  . PULMONARY EMBOLISM SURGERY  2007   LUNG COLLAPSE  . RIGHT AND LEFT HEART CATH N/A 06/25/2017   Procedure: RIGHT AND LEFT HEART CATH;  Surgeon: Sherren Mocha, MD;  Location: [place] [facility];  Service: Cardiovascular;  Laterality: N/A;  . SHOULDER ADHESION RELEASE Right ~ 2004  . SHOULDER ARTHROSCOPY W/ ROTATOR CUFF REPAIR Right ~ 2003     Home Medications:  Prior to Admission medications   Medication Sig Start Date End Date Taking? Authorizing Provider  co-enzyme Q-10 50 MG capsule TAKE 1 CAPSULE (50 MG TOTAL) BY MOUTH DAILY. Patient taking differently: Take 50 mg by mouth daily.  05/29/18  Yes Dorothy Spark, MD  diltiazem (CARDIZEM CD) 240 MG 24 hr capsule TAKE 1 CAPSULE BY MOUTH EVERY DAY Patient taking differently: Take 240 mg by mouth daily.  02/13/19  Yes Dorothy Spark, MD  ELIQUIS 5 MG TABS tablet TAKE 1 TABLET BY MOUTH TWICE A DAY Patient taking differently: Take 5 mg by mouth 2 (two) times daily.  12/02/18  Yes Dorothy Spark, MD  furosemide (LASIX) 40 MG tablet TAKE 1 TABLET BY MOUTH EVERY DAY Patient taking differently: Take 40 mg by mouth daily.  08/26/18  Yes Dorothy Spark, MD  HUMALOG MIX 75/25 KWIKPEN (75-25) 100 UNIT/ML Kwikpen Inject 30-40 Units into the skin See admin instructions. Inject 40 units SQ in Am and 30 units SQ in PM 12/22/15  Yes [provider]  levothyroxine (SYNTHROID, LEVOTHROID)  50 MCG tablet Take 50 mcg by mouth daily before breakfast.  05/14/17  Yes [provider]  losartan (COZAAR) 25 MG tablet TAKE 1 TABLET (25 MG TOTAL) BY MOUTH DAILY. 01/06/19  Yes Dorothy Spark, MD  methocarbamol (ROBAXIN) 500 MG tablet Take 1 tablet (500 mg total) by mouth every 8 (eight) hours as needed for muscle spasms. 12/23/18  Yes Glendale Chard, MD  metoprolol tartrate (LOPRESSOR) 25 MG tablet TAKE 1 TABLET (25 MG TOTAL) BY MOUTH 2 (TWO) TIMES DAILY. 08/14/18  Yes Dorothy Spark, MD  nitroGLYCERIN (NITROSTAT) 0.4 MG SL tablet Place 1 tablet (0.4 mg total) under the tongue every 5 (five) minutes as needed for chest pain. 01/02/18  Yes Lyda Jester M, PA-C  pravastatin (PRAVACHOL) 20  MG tablet TAKE 1 TABLET BY MOUTH EVERY DAY IN THE EVENING Patient taking differently: Take 20 mg by mouth daily.  11/13/18  Yes Dorothy Spark, MD  Semaglutide,0.25 or 0.5MG /DOS, (OZEMPIC, 0.25 OR 0.5 MG/DOSE,) 2 MG/1.5ML SOPN Inject 2 mg into the skin every Tuesday.    Yes [provider]  spironolactone (ALDACTONE) 25 MG tablet Take 1 tablet (25 mg total) by mouth daily. 02/13/19  Yes Dorothy Spark, MD  tiZANidine (ZANAFLEX) 4 MG tablet Take 4 mg by mouth every 8 (eight) hours as needed for muscle spasms. 02/09/19  Yes [provider]  Vitamin D, Ergocalciferol, (DRISDOL) 50000 units CAPS capsule TAKE 1 CAPSULE BY MOUTH ON TUESDAY AND FRIDAY Patient taking differently: Take 50,000 Units by mouth See admin instructions. Take one capsule by mouth only on Tuesday and Friday. 07/27/18  Yes Glendale Chard, MD  B-D UF III MINI PEN NEEDLES 31G X 5 MM MISC 2 (two) times daily. as directed 10/31/18   [provider]  ONE TOUCH ULTRA TEST test strip USE AS DIRECTED 3 TIMES A DAY 10/04/18   [provider]    Inpatient Medications: Scheduled Meds: . apixaban  5 mg Oral BID  . diltiazem  240 mg Oral Daily  . furosemide  40 mg Oral Daily  . insulin aspart  0-9  Units Subcutaneous TID WC  . insulin aspart protamine- aspart  15 Units Subcutaneous Q supper  . insulin aspart protamine- aspart  25 Units Subcutaneous Q breakfast  . losartan  25 mg Oral Daily  . metoprolol tartrate  25 mg Oral BID  . pravastatin  20 mg Oral q1800  . spironolactone  25 mg Oral Daily   Continuous Infusions:  PRN Meds: hydrALAZINE, HYDROmorphone (DILAUDID) injection, methocarbamol, nitroGLYCERIN, ondansetron (ZOFRAN) IV  Allergies:    Allergies  Allergen Reactions  . Hydrocodone Hives and Itching  . Hydrocodone-Acetaminophen Hives  . Morphine And Related Other (See Comments)    GI PROBLEMS  . Rosuvastatin Other (See Comments)    Pt reports causes lower extremity muscle aches/cramping  . Lisinopril Cough  . Nsaids Other (See Comments)    GI PROBLEMS    Social History:   Social History   Socioeconomic History  . Marital status: Single    Spouse name: Not on file  . Number of children: 0  . Years of education: Not on file  . Highest education level: Not on file  Occupational History  . Occupation: RETIRED  Social Needs  . Financial resource strain: Not hard at all  . Food insecurity:    Worry: Never true    Inability: Never true  . Transportation needs:    Medical: No    Non-medical: No  Tobacco Use  . Smoking status: Former Smoker    Packs/day: 0.25    Years: 5.00    Pack years: 1.25    Types: Cigarettes    Last attempt to quit: 07/22/1999    Years since quitting: 19.5  . Smokeless tobacco: Never Used  Substance and Sexual Activity  . Alcohol use: No    Alcohol/week: 0.0 standard drinks  . Drug use: No  . Sexual activity: Not Currently    Birth control/protection: Other-see comments    Comment: hysterectomy  Lifestyle  . Physical activity:    Days per week: 0 days    Minutes per session: 0 min  . Stress: Not at all  Relationships  . Social connections:    Talks on phone: Not on  file    Gets together: Not on file    Attends religious  service: Not on file    Active member of club or organization: Not on file    Attends meetings of clubs or organizations: Not on file    Relationship status: Not on file  . Intimate partner violence:    Fear of current or ex partner: No    Emotionally abused: No    Physically abused: No    Forced sexual activity: No  Other Topics Concern  . Not on file  Social History Narrative  . Not on file    Family History:   Family History  Problem Relation Age of Onset  . Heart disease Father   . Diabetes Father   . Pancreatic cancer Mother   . Diabetes Mother   . Hypertension Sister        x 3  . Diabetes Sister        x 4  . Diabetes Maternal Grandmother   . Diabetes Paternal Grandmother   . Diabetes Sister   . Diabetes Sister   . Diabetes Sister   . Kidney disease Sister   . Colon cancer Neg Hx   . Colon polyps Neg Hx   . Rectal cancer Neg Hx   . Stomach cancer Neg Hx   . Esophageal cancer Neg Hx   . Breast cancer Neg Hx      ROS:  Please see the history of present illness.  All other ROS reviewed and negative.     Physical Exam/Data:   Vitals:   02/17/19 0700 02/17/19 0751 02/17/19 0752 02/17/19 1200  BP:   135/73 (!) 147/92  Pulse: 93 73 86 78  Resp: 17 18 (!) 26 (!) 21  Temp:   (!) 97.5 F (36.4 C)   TempSrc:   Oral   SpO2: 98% 97% 96% 97%  Weight:      Height:        Intake/Output Summary (Last 24 hours) at 02/17/2019 1302 Last data filed at 02/17/2019 0751 Gross per 24 hour  Intake 139.54 ml  Output -  Net 139.54 ml   Last 3 Weights 02/17/2019 02/16/2019 01/01/2019  Weight (lbs) 239 lb 10.2 oz 232 lb 236 lb 3.2 oz  Weight (kg) 108.7 kg 105.235 kg 107.14 kg     Body mass index is 36.98 kg/m.  General:  Well nourished, well developed, in no acute distress HEENT: normal Lymph: no adenopathy Neck: no JVD Endocrine:  No thryomegaly Vascular: No carotid bruits; FA pulses 2+ bilaterally without bruits  Cardiac:  normal S1, S2; RRR, 2/6 SEMRUSB Lungs:   clear to auscultation bilaterally, no wheezing, rhonchi or rales  Abd: soft, nontender, no hepatomegaly  Ext: no edema Musculoskeletal:  No deformities, BUE and BLE strength normal and equal Skin: warm and dry  Neuro:  CNs 2-12 intact, no focal abnormalities noted Psych:  Normal affect   EKG:  The EKG was personally reviewed and demonstrates:   AFib 160bpm SR 100bpm, agree qith QTc 42ms SR 79bpm, agree QTc 44ms Telemetry:  Telemetry was personally reviewed and demonstrates:   AFib w/ RVR >> ST >.SR, 70's-80's currently  Relevant CV Studies:  02/17/2019: TTE IMPRESSIONS  1. The left ventricle has hyperdynamic systolic function, with an ejection fraction of >65%. The cavity size was normal. There is moderately increased left ventricular wall thickness. Left ventricular diastolic Doppler parameters are consistent with  pseudonormalization. Elevated left atrial and left ventricular end-diastolic pressures The E/e'  is >15. No evidence of left ventricular regional wall motion abnormalities.  2. The right ventricle has normal systolic function. The cavity was normal. There is no increase in right ventricular wall thickness.  3. The mitral valve is grossly normal.  4. The tricuspid valve is grossly normal.  5. The aortic valve is tricuspid. Mild sclerosis of the aortic valve. Aortic valve regurgitation is mild to moderate by color flow Doppler. mildly increased gradient without stenosis of the aortic valve.  6. The aortic root and ascending aorta are normal in size and structure.  7. The inferior vena cava was dilated in size with <50% respiratory variability.  SUMMARY   LVEF 65-70%, moderate LVH, normal wall motion, grade 2 DD, elevated LV filling pressure, aortic sclerosis with moderate AI, normal RV function, mild PI, trivial TR, dilated IVC, no significant pericardial effusion  FINDINGS  Left Ventricle: The left ventricle has hyperdynamic systolic function, with an ejection fraction  of >65%. The cavity size was normal. There is moderately increased left ventricular wall thickness. Left ventricular diastolic Doppler parameters are  consistent with pseudonormalization. Elevated left atrial and left ventricular end-diastolic pressures The E/e' is >15. No evidence of left ventricular regional wall motion abnormalities..  Right Ventricle: The right ventricle has normal systolic function. The cavity was normal. There is no increase in right ventricular wall thickness.  Left Atrium: Left atrial size was normal in size.  Right Atrium: Right atrial size was normal in size. Right atrial pressure is estimated at 10 mmHg.  Interatrial Septum: No atrial level shunt detected by color flow Doppler.  Pericardium: There is no evidence of pericardial effusion.  Mitral Valve: The mitral valve is grossly normal. Mitral valve regurgitation is trivial by color flow Doppler.  Tricuspid Valve: The tricuspid valve is grossly normal. Tricuspid valve regurgitation is trivial by color flow Doppler.  Aortic Valve: The aortic valve is tricuspid Mild sclerosis of the aortic valve. Aortic valve regurgitation is mild to moderate by color flow Doppler. There is no evidence of aortic valve stenosis. There is mildly increased gradient without stenosis of  the aortic valve.  Pulmonic Valve: The pulmonic valve was grossly normal. Pulmonic valve regurgitation is mild by color flow Doppler.  Aorta: The aortic root and ascending aorta are normal in size and structure.  Venous: The inferior vena cava is dilated in size with less than 50% respiratory variability.  06/25/17: RHC 1. Elevated left and right heart diastolic filling pressures 2. Preserved cardiac output 3. No evidence of ventricular interdependence based on simultaneous LV/RV pressure recording  Summary: Probable biventricular diastolic dysfunction. No clear evidence of the hemodynamic sequelae of pericardial constriction   LHC  12/18/2016 Conclusion    1st Mrg lesion, 30 %stenosed.  Ost 2nd Mrg to 2nd Mrg lesion, 20 %stenosed.  Mid Cx lesion, 0 %stenosed.  Ost LAD to Mid LAD lesion, 0 %stenosed.  Ost 1st Diag to 1st Diag lesion, 70 %stenosed.  1st Diag lesion, 99 %stenosed.  The left ventricular systolic function is normal.  LV end diastolic pressure is normal.  The left ventricular ejection fraction is greater than 65% by visual estimate.  There is no mitral valve regurgitation.  1. Double vessel CAD, stable.  2. Patent stent in the proximal to mid LAD with no restenosis 3. Patent stent in the mid Circumflex with no restenosis 4. The Diagonal branch is a small caliber vessel that is jailed by the LAD stent. There is severe disease in this small branch but PCI is  not a good option 5. Small non-dominant RCA 6. Normal LV systolic function  Recommendations: Continue medical management of CAD.    Laboratory Data:  Chemistry Recent Labs  Lab 02/16/19 2326  NA 139  K 3.6  CL 102  CO2 23  GLUCOSE 244*  BUN 21*  CREATININE 0.94  CALCIUM 9.1  GFRNONAA >60  GFRAA >60  ANIONGAP 14    No results for input(s): PROT, ALBUMIN, AST, ALT, ALKPHOS, BILITOT in the last 168 hours. Hematology Recent Labs  Lab 02/16/19 2326  WBC 9.7  RBC 3.76*  HGB 11.9*  HCT 36.5  MCV 97.1  MCH 31.6  MCHC 32.6  RDW 11.9  PLT 329   Cardiac Enzymes Recent Labs  Lab 02/16/19 2326 02/17/19 0214 02/17/19 0704  TROPONINI <0.03 <0.03 <0.03   No results for input(s): TROPIPOC in the last 168 hours.  BNP Recent Labs  Lab 02/17/19 0214  BNP 46.5    DDimer No results for input(s): DDIMER in the last 168 hours.  Radiology/Studies:   Dg Chest Port 1 View Result Date: 02/17/2019 CLINICAL DATA:  Initial evaluation for acute chest pain. EXAM: PORTABLE CHEST 1 VIEW COMPARISON:  Prior radiograph from 12/29/2018 FINDINGS: Cardiomegaly with prominence of the right atrial contour, stable. Coronary stent in  place. Mediastinal silhouette within normal limits. Elevation of the left hemidiaphragm with postsurgical changes at the left lung apex, stable. Additional postsurgical changes present at the right upper lobe. No focal infiltrates. No edema or effusion. No visible pneumothorax. No acute osseous finding. IMPRESSION: 1. No active cardiopulmonary disease. 2. Chronic postsurgical changes involving the upper lobes bilaterally, left greater than right, stable. 3. Cardiomegaly, unchanged. Electronically Signed   By: Jeannine Boga M.D.   On: 02/17/2019 00:04    Assessment and Plan:   1. Paroysxysmal AFib     CHA2DS2Vasc is 4, (female, CAD, HTN, DM)  AAD options limited Not an amio candidate given significant lung disease Not 1c candidate given CAD  QT borderline/may be a touch long for Tikosyn, sotalol  Multaq perhaps, though low likely hood of holding  Significant lung disease will make maintaining SR challenging LA normal in size, measures 67mm, may be an ablation candidate    Dr. Curt Bears will see and examine the patient later today        For questions or updates, please contact [facility] Please consult [email or web address] for contact info under     Signed, Baldwin Jamaica, PA-C  02/17/2019 1:02 PM  I have seen and examined this patient with Tommye Standard.  Agree with above, note added to reflect my findings.  On exam, RRR, lungs clear.  Patient admitted to the hospital with atrial fibrillation with rapid rates.  Currently in sinus rhythm and feeling well.  She has had more recent episodes of atrial fibrillation that have been bothersome.  Medical management would be ideal.  Due to that, we will plan to start dofetilide today.  She will need an ECG after each dose.  We will work to keep potassium greater than 4 and magnesium greater than 1.9.  We will start pharmacy protocol for electrolyte management.  We will get an ECG after each dose.  The patient will need to be hospitalized for  6 doses of dofetilide.  Will M. Camnitz MD 02/17/2019 2:38 PM

## 2019-02-17 NOTE — ED Notes (Signed)
Please call niece Starr Sinclair at [phone] to give an update on patient.

## 2019-02-17 NOTE — Discharge Instructions (Addendum)
YOUR CARDIOLOGY TEAM HAS ARRANGED FOR AN E-VISIT FOR YOUR APPOINTMENT - PLEASE REVIEW IMPORTANT INFORMATION BELOW SEVERAL DAYS PRIOR TO YOUR APPOINTMENT  Due to the recent COVID-19 pandemic, we are transitioning in-person office visits to tele-medicine visits in an effort to decrease unnecessary exposure to our patients, their families, and staff. These visits are billed to your insurance just like a normal visit is. We also encourage you to sign up for MyChart if you have not already done so. You will need a smartphone if possible. For patients that do not have this, we can still complete the visit using a regular telephone but do prefer a smartphone to enable video when possible. You may have a family member that lives with you that can help. If possible, we also ask that you have a blood pressure cuff and scale at home to measure your blood pressure, heart rate and weight prior to your scheduled appointment. Patients with clinical needs that need an in-person evaluation and testing will still be able to come to the office if absolutely necessary. If you have any questions, feel free to call our office.     YOUR PROVIDER WILL BE USING THE FOLLOWING PLATFORM TO COMPLETE YOUR VISIT:  Doximity    IF USING MYCHART - How to Download the MyChart App to Your SmartPhone   - If Apple, go to [employer] and type in MyChart in the search bar and download the app. If Android, ask patient to go to [employer] and type in [place] in the search bar and download the app. The app is free but as with any other app downloads, your phone may require you to verify saved payment information or Apple/Android password.  - You will need to then log into the app with your MyChart username and password, and select [facility] as your healthcare provider to link the account.  - When it is time for your visit, go to the MyChart app, find appointments, and click Begin Video Visit. Be sure to Select Allow for your device to  access the Microphone and Camera for your visit. You will then be connected, and your provider will be with you shortly.  **If you have any issues connecting or need assistance, please contact MyChart service desk [phone])**  **If using a computer, in order to ensure the best quality for your visit, you will need to use either of the following Internet Browsers: Insurance underwriter or Microsoft Edge**   IF USING DOXIMITY or [email or web address] - The staff will give you instructions on receiving your link to join the meeting the day of your visit.      2-3 DAYS BEFORE YOUR APPOINTMENT  You will receive a telephone call from one of our [facility] team members - your caller ID may say "Unknown caller." If this is a video visit, we will walk you through how to get the video launched on your phone. We will remind you check your blood pressure, heart rate and weight prior to your scheduled appointment. If you have an Apple Watch or Kardia, please upload any pertinent ECG strips the day before or morning of your appointment to [facility]. Our staff will also make sure you have reviewed the consent and agree to move forward with your scheduled tele-health visit.     THE DAY OF YOUR APPOINTMENT  Approximately 15 minutes prior to your scheduled appointment, you will receive a telephone call from one of [facility] team - your caller ID may say "Unknown  caller."  Our staff will confirm medications, vital signs for the day and any symptoms you may be experiencing. Please have this information available prior to the time of visit start. It may also be helpful for you to have a pad of paper and pen handy for any instructions given during your visit. They will also walk you through joining the smartphone meeting if this is a video visit.    CONSENT FOR TELE-HEALTH VISIT - PLEASE REVIEW  I hereby voluntarily request, consent and authorize [facility] and its employed or contracted physicians, physician  assistants, nurse practitioners or other licensed health care professionals (the Practitioner), to provide me with telemedicine health care services (the Services") as deemed necessary by the treating Practitioner. I acknowledge and consent to receive the Services by the Practitioner via telemedicine. I understand that the telemedicine visit will involve communicating with the Practitioner through live audiovisual communication technology and the disclosure of certain medical information by electronic transmission. I acknowledge that I have been given the opportunity to request an in-person assessment or other available alternative prior to the telemedicine visit and am voluntarily participating in the telemedicine visit.  I understand that I have the right to withhold or withdraw my consent to the use of telemedicine in the course of my care at any time, without affecting my right to future care or treatment, and that the Practitioner or I may terminate the telemedicine visit at any time. I understand that I have the right to inspect all information obtained and/or recorded in the course of the telemedicine visit and may receive copies of available information for a reasonable fee.  I understand that some of the potential risks of receiving the Services via telemedicine include:   Delay or interruption in medical evaluation due to technological equipment failure or disruption;  Information transmitted may not be sufficient (e.g. poor resolution of images) to allow for appropriate medical decision making by the Practitioner; and/or   In rare instances, security protocols could fail, causing a breach of personal health information.  Furthermore, I acknowledge that it is my responsibility to provide information about my medical history, conditions and care that is complete and accurate to the best of my ability. I acknowledge that Practitioner's advice, recommendations, and/or decision may be based on  factors not within their control, such as incomplete or inaccurate data provided by me or distortions of diagnostic images or specimens that may result from electronic transmissions. I understand that the practice of medicine is not an exact science and that Practitioner makes no warranties or guarantees regarding treatment outcomes. I acknowledge that I will receive a copy of this consent concurrently upon execution via email to the email address I last provided but may also request a printed copy by calling the office of [facility].    I understand that my insurance will be billed for this visit.   I have read or had this consent read to me.  I understand the contents of this consent, which adequately explains the benefits and risks of the Services being provided via telemedicine.   I have been provided ample opportunity to ask questions regarding this consent and the Services and have had my questions answered to my satisfaction.  I give my informed consent for the services to be provided through the use of telemedicine in my medical care  By participating in this telemedicine visit I agree to the above.   You have an appointment set up with the [facility].  Multiple studies have shown that being followed by a dedicated atrial fibrillation clinic in addition to the standard care you receive from your other physicians improves health. We believe that enrollment in the atrial fibrillation clinic will allow [place] to better care for you.   The phone number to the [facility] is [phone]. The clinic is staffed Monday through Friday from 8:30am to 5pm.  Parking Directions: The clinic is located in the Heart and Vascular Building connected to [facility]. 1)From [address] turn on to [address] and go to the 3rd entrance  (Heart and Vascular entrance) on the right. 2)Look to the right for Heart &Vascular Parking Garage. 3)A code for the entrance  is [place].   4)Take the elevators to the 1st floor. Registration is in the room with the glass walls at the end of the hallway.  If you have any trouble parking or locating the clinic, please dont hesitate to call [phone].     Information on my medicine - ELIQUIS (apixaban)  Why was Eliquis prescribed for you? Eliquis was prescribed for you to reduce the risk of a blood clot forming that can cause a stroke if you have a medical condition called atrial fibrillation (a type of irregular heartbeat).  What do You need to know about Eliquis ? Take your Eliquis TWICE DAILY - one tablet in the morning and one tablet in the evening with or without food. If you have difficulty swallowing the tablet whole please discuss with your pharmacist how to take the medication safely.  Take Eliquis exactly as prescribed by your doctor and DO NOT stop taking Eliquis without talking to the doctor who prescribed the medication.  Stopping may increase your risk of developing a stroke.  Refill your prescription before you run out.  After discharge, you should have regular check-up appointments with your healthcare provider that is prescribing your Eliquis.  In the future your dose may need to be changed if your kidney function or weight changes by a significant amount or as you get older.  What do you do if you miss a dose? If you miss a dose, take it as soon as you remember on the same day and resume taking twice daily.  Do not take more than one dose of ELIQUIS at the same time to make up a missed dose.  Important Safety Information A possible side effect of Eliquis is bleeding. You should call your healthcare provider right away if you experience any of the following: ? Bleeding from an injury or your nose that does not stop. ? Unusual colored urine (red or dark brown) or unusual colored stools (red or black). ? Unusual bruising for unknown reasons. ? A serious fall or if you hit your head (even  if there is no bleeding).  Some medicines may interact with Eliquis and might increase your risk of bleeding or clotting while on Eliquis. To help avoid this, consult your healthcare provider or pharmacist prior to using any new prescription or non-prescription medications, including herbals, vitamins, non-steroidal anti-inflammatory drugs (NSAIDs) and supplements.  This website has more information on Eliquis (apixaban): http://[email or web address].[email or web address]/home

## 2019-02-17 NOTE — Consult Note (Signed)
Cardiology Consultation:   Patient ID: [name] MRN: [id number]; DOB: 06-23-1956  Admit date: 02/16/2019 Date of Consult: 02/17/2019  Primary Care Provider: Glendale Chard, MD Primary Cardiologist: Ena Dawley, MD  Primary Electrophysiologist:  None   Patient Profile:   [name] is a 63 y.o. female with a hx of CAD s/p DES to [facility] and mLAD 07/11/15 (patent stents on repeat cath 11/2016),chronic pericardial effusion, h/o chronic pericarditis with chronic pericardial rub treated intermittently with steroids and colchicine, PAF on Eliquis, chronic diastolic CHF, DM, HTN,remote h/o treatednon-small cell lung cancer, pulmonary fibrosis, aortic insufficiency, COPD and chronic anemia who is being seen today for the evaluation of symptomatic atrial fibrillation w/ RVR and pleuritic CP, at the request of Dr. Broadus John, Internal Medicine.   History of Present Illness:   [name] is a63 y.o.femalewithahistory of CAD s/p DES to [facility] and mLAD 07/11/15 (patent stents on repeat cath 11/2016),chronic pericardial effusion, h/o chronic pericarditis with chronic pericardial rub treated intermittently with steroids and colchicine, PAF on Eliquis, chronic diastolic CHF, DM, HTN,remote h/o treatednon-small cell lung cancer, pulmonary fibrosis, aortic insufficiency, COPD and chronic anemia, admitted to the hospital for symptomatic atrial fibrillation w/ RVR.   She was recently seen by Dr. [name] for her annual cardiac f/u on 10/28/18. She denied ischemic CP. No palpitations. Denied dyspnea. No EKG done but exam noted RRR. Surveillance echo was ordered and done on 11/02/18. Echo showed vigorous LV systolic function, LVEF, 38-45%, no WMAs. There was moderate AI. RA was mildly dilated. There was a trivial pericardial effusion. No med changes were made. Eliquis was continued for stroke prophylaxis for PAF and pt advised to f/u in 6 months.   Pt presented to the [facility] last night w/ CC of chest pain and tachy  palpitations, occurring intermittently over the course of 7 days. Based on prior experience, pt suspected she was going in and out of atrial fibrillation. Pt describes her CP as mainly sharp pain but has also had mild chest pressure. She has sharp pain radiating up into her neck and down left arm. Short lived, lasting 5 sec at a time. Pleuritic. Hurts to take a deep breath. Some mild intermittent dyspnea. Not exacerbated by physical activity. She also notes that she was doing some heavy lifting last week while moving. She initially thought that maybe she had pulled a muscle or slept wrong. Last night, she developed prolonged tachy palpitations prompting her to come to the ED.   EKG in the ED confirmed atrial fib w/ RVR in the 160s. IV Cardizem was started in the ED and pt noted to have spontaneous conversion back to NSR but continued to endorse mild CP, despite conversion. CP ultimately eased off w/ NTG and dilaudid. Troponins cycled x 3 and all negative. TSH normal. BNP normal at 46. K normal on admit at 3.6. Mg 1.9. WBC ct WNL. Hgb 11.9 c/w her baseline. SCr WNL at 0.94. Afebrile. CXR w/ no active cardiopulmonary disease. She denied any urinary symptoms. Pt has been admitted by IM. Repeat 2D echo ordered and pending. Cardiology asked to further evaluate.   Past Medical History:  Diagnosis Date   Allergy    Anemia    Anemia    Aortic insufficiency    a. Mod by echo 05/2015.   Arthritis    "knees" (07/11/2015)   Atrial fibrillation (HCC)    On Eliquis   Carcinoma of hilus of lung ([place]) 01/20/2012   IIIB NSCL  Right hilar mass  compressing esophagus/presenting with dysphagia Rx Surgery/RT/chemo dx January 0258   Complication of anesthesia    "I had a hard time waking me up w/one of my shoulder surgeries"   Coronary artery disease    a. Abnormal stress test -> LHc 06/2015 s/p DES to mCX and mLAD, residual D2 disease treated medially.   Diverticulitis    Elevated blood pressure    Erythema  nodosum    Heart murmur    Insomnia    lung ca dx'd 10/1999   chemo/xrt comp 05/29/2000   Myalgia    Nephrolithiasis 2015   Obesity    Pericardial effusion    a. 05/2015 Echo: EF 55-60%, no rwma, Gr 1 DD, no effusion - but an effusion was seen on CT which was felt to be increased in size compared to 12/2014 - pericardium also thickened.    Pericarditis    a. Dx 05/2015.   Pneumothorax, spontaneous, tension 01/20/2012   October, 2010   Pulmonary fibrosis ([place]) 01/20/2012   Due to previous surgery and chest radiation for lung cancer   Restrictive lung disease    Type II diabetes mellitus [facility])     Past Surgical History:  Procedure Laterality Date   ABDOMINAL HYSTERECTOMY  2008   APPENDECTOMY  1969?   CARDIAC CATHETERIZATION N/A 07/11/2015   Procedure: Left Heart Cath and Coronary Angiography;  Surgeon: Jettie Booze, MD;  Location: [place] CV LAB;  Service: Cardiovascular;  Laterality: N/A;   CARDIAC CATHETERIZATION  07/11/2015   Procedure: Coronary Stent Intervention;  Surgeon: Jettie Booze, MD;  Location: [place] CV LAB;  Service: Cardiovascular;;   CARPAL TUNNEL RELEASE Bilateral 1998-2005?   right-left   CARPAL TUNNEL RELEASE Left 2009?   "for the 2nd time"   COLONOSCOPY     COLONOSCOPY WITH PROPOFOL N/A 05/28/2018   Procedure: COLONOSCOPY WITH PROPOFOL;  Surgeon: Milus Banister, MD;  Location: [facility];  Service: Endoscopy;  Laterality: N/A;   CORONARY ANGIOPLASTY     CYSTOSCOPY W/ STONE MANIPULATION  2013   ESOPHAGOGASTRODUODENOSCOPY (EGD) WITH ESOPHAGEAL DILATION  2012   FOOT SURGERY Bilateral 1980's   Callous removed    INNER EAR SURGERY Right ~ 2009   KNEE ARTHROPLASTY Right 1991   KNEE ARTHROSCOPY  ~ 2000   LATERAL RELEASE   LEFT HEART CATH AND CORONARY ANGIOGRAPHY N/A 12/18/2016   Procedure: Left Heart Cath and Coronary Angiography;  Surgeon: Burnell Blanks, MD;  Location: [place] CV LAB;  Service:  Cardiovascular;  Laterality: N/A;   LUNG CANCER SURGERY Left 2001   "small cell"   LUNG SURGERY Right 2007   "reinflated it"   PULMONARY EMBOLISM SURGERY  2007   LUNG COLLAPSE   RIGHT AND LEFT HEART CATH N/A 06/25/2017   Procedure: RIGHT AND LEFT HEART CATH;  Surgeon: Sherren Mocha, MD;  Location: [place] CV LAB;  Service: Cardiovascular;  Laterality: N/A;   SHOULDER ADHESION RELEASE Right ~ 2004   SHOULDER ARTHROSCOPY W/ ROTATOR CUFF REPAIR Right ~ 2003     Home Medications:  Prior to Admission medications   Medication Sig Start Date End Date Taking? Authorizing Provider  co-enzyme Q-10 50 MG capsule TAKE 1 CAPSULE (50 MG TOTAL) BY MOUTH DAILY. Patient taking differently: Take 50 mg by mouth daily.  05/29/18  Yes Dorothy Spark, MD  diltiazem (CARDIZEM CD) 240 MG 24 hr capsule TAKE 1 CAPSULE BY MOUTH EVERY DAY Patient taking differently: Take 240 mg by mouth daily.  02/13/19  Yes Dorothy Spark, MD  ELIQUIS 5 MG TABS tablet TAKE 1 TABLET BY MOUTH TWICE A DAY Patient taking differently: Take 5 mg by mouth 2 (two) times daily.  12/02/18  Yes Dorothy Spark, MD  furosemide (LASIX) 40 MG tablet TAKE 1 TABLET BY MOUTH EVERY DAY Patient taking differently: Take 40 mg by mouth daily.  08/26/18  Yes Dorothy Spark, MD  HUMALOG MIX 75/25 KWIKPEN (75-25) 100 UNIT/ML Kwikpen Inject 30-40 Units into the skin See admin instructions. Inject 40 units SQ in Am and 30 units SQ in PM 12/22/15  Yes [provider]  levothyroxine (SYNTHROID, LEVOTHROID) 50 MCG tablet Take 50 mcg by mouth daily before breakfast.  05/14/17  Yes [provider]  losartan (COZAAR) 25 MG tablet TAKE 1 TABLET (25 MG TOTAL) BY MOUTH DAILY. 01/06/19  Yes Dorothy Spark, MD  methocarbamol (ROBAXIN) 500 MG tablet Take 1 tablet (500 mg total) by mouth every 8 (eight) hours as needed for muscle spasms. 12/23/18  Yes Glendale Chard, MD  metoprolol tartrate (LOPRESSOR) 25 MG tablet TAKE 1 TABLET (25  MG TOTAL) BY MOUTH 2 (TWO) TIMES DAILY. 08/14/18  Yes Dorothy Spark, MD  nitroGLYCERIN (NITROSTAT) 0.4 MG SL tablet Place 1 tablet (0.4 mg total) under the tongue every 5 (five) minutes as needed for chest pain. 01/02/18  Yes Simmons, Brittainy M, PA-C  pravastatin (PRAVACHOL) 20 MG tablet TAKE 1 TABLET BY MOUTH EVERY DAY IN THE EVENING Patient taking differently: Take 20 mg by mouth daily.  11/13/18  Yes Dorothy Spark, MD  Semaglutide,0.25 or 0.5MG/DOS, (OZEMPIC, 0.25 OR 0.5 MG/DOSE,) 2 MG/1.5ML SOPN Inject 2 mg into the skin every Tuesday.    Yes [provider]  spironolactone (ALDACTONE) 25 MG tablet Take 1 tablet (25 mg total) by mouth daily. 02/13/19  Yes Dorothy Spark, MD  tiZANidine (ZANAFLEX) 4 MG tablet Take 4 mg by mouth every 8 (eight) hours as needed for muscle spasms. 02/09/19  Yes [provider]  Vitamin D, Ergocalciferol, (DRISDOL) 50000 units CAPS capsule TAKE 1 CAPSULE BY MOUTH ON TUESDAY AND FRIDAY Patient taking differently: Take 50,000 Units by mouth See admin instructions. Take one capsule by mouth only on Tuesday and Friday. 07/27/18  Yes Glendale Chard, MD  B-D UF III MINI PEN NEEDLES 31G X 5 MM MISC 2 (two) times daily. as directed 10/31/18   [provider]  ONE TOUCH ULTRA TEST test strip USE AS DIRECTED 3 TIMES A DAY 10/04/18   [provider]    Inpatient Medications: Scheduled Meds:  apixaban  5 mg Oral BID   diltiazem  240 mg Oral Daily   furosemide  40 mg Oral Daily   insulin aspart  0-9 Units Subcutaneous TID WC   insulin aspart protamine- aspart  15 Units Subcutaneous Q supper   insulin aspart protamine- aspart  25 Units Subcutaneous Q breakfast   losartan  25 mg Oral Daily   metoprolol tartrate  25 mg Oral BID   pravastatin  20 mg Oral q1800   spironolactone  25 mg Oral Daily   Continuous Infusions:  diltiazem (CARDIZEM) infusion 5 mg/hr (02/17/19 0007)   nitroGLYCERIN 35 mcg/min (02/17/19 0651)     PRN Meds: hydrALAZINE, HYDROmorphone (DILAUDID) injection, methocarbamol, nitroGLYCERIN, ondansetron (ZOFRAN) IV  Allergies:    Allergies  Allergen Reactions   Hydrocodone Hives and Itching   Hydrocodone-Acetaminophen Hives   Morphine And Related Other (See Comments)    GI PROBLEMS   Rosuvastatin  Other (See Comments)    Pt reports causes lower extremity muscle aches/cramping   Lisinopril Cough   Nsaids Other (See Comments)    GI PROBLEMS    Social History:   Social History   Socioeconomic History   Marital status: Single    Spouse name: Not on file   Number of children: 0   Years of education: Not on file   Highest education level: Not on file  Occupational History   Occupation: RETIRED  Social Designer, fashion/clothing strain: Not hard at all   Food insecurity:    Worry: Never true    Inability: Never true   Transportation needs:    Medical: No    Non-medical: No  Tobacco Use   Smoking status: Former Smoker    Packs/day: 0.25    Years: 5.00    Pack years: 1.25    Types: Cigarettes    Last attempt to quit: 07/22/1999    Years since quitting: 19.5   Smokeless tobacco: Never Used  Substance and Sexual Activity   Alcohol use: No    Alcohol/week: 0.0 standard drinks   Drug use: No   Sexual activity: Not Currently    Birth control/protection: Other-see comments    Comment: hysterectomy  Lifestyle   Physical activity:    Days per week: 0 days    Minutes per session: 0 min   Stress: Not at all  Relationships   Social connections:    Talks on phone: Not on file    Gets together: Not on file    Attends religious service: Not on file    Active member of club or organization: Not on file    Attends meetings of clubs or organizations: Not on file    Relationship status: Not on file   Intimate partner violence:    Fear of current or ex partner: No    Emotionally abused: No    Physically abused: No    Forced sexual activity: No  Other  Topics Concern   Not on file  Social History Narrative   Not on file    Family History:    Family History  Problem Relation Age of Onset   Heart disease Father    Diabetes Father    Pancreatic cancer Mother    Diabetes Mother    Hypertension Sister        x 3   Diabetes Sister        x 4   Diabetes Maternal Grandmother    Diabetes Paternal Grandmother    Diabetes Sister    Diabetes Sister    Diabetes Sister    Kidney disease Sister    Colon cancer Neg Hx    Colon polyps Neg Hx    Rectal cancer Neg Hx    Stomach cancer Neg Hx    Esophageal cancer Neg Hx    Breast cancer Neg Hx      ROS:  Please see the history of present illness.   All other ROS reviewed and negative.     Physical Exam/Data:   Vitals:   02/17/19 0500 02/17/19 0700 02/17/19 0751 02/17/19 0752  BP:    135/73  Pulse: 83 93 73 86  Resp: _0 (!) 26  Temp:    (!) 97.5 F (36.4 C)  TempSrc:    Oral  SpO2: 95% 98% 97% 96%  Weight:      Height:        Intake/Output Summary (Last 24  hours) at 02/17/2019 0914 Last data filed at 02/17/2019 0751 Gross per 24 hour  Intake 139.54 ml  Output --  Net 139.54 ml   Last 3 Weights 02/17/2019 02/16/2019 01/01/2019  Weight (lbs) 239 lb 10.2 oz 232 lb 236 lb 3.2 oz  Weight (kg) 108.7 kg 105.235 kg 107.14 kg     Body mass index is 36.98 kg/m.  General: Pleasant obese woman, in no acute distress HEENT: normal Lymph: no adenopathy Neck: no JVD Endocrine:  No thryomegaly Vascular: No carotid bruits; FA pulses 2+ bilaterally   Cardiac:  normal S1, S2; RRR; 2/6 systolic ejection murmur at the right upper sternal border Lungs:  clear to auscultation bilaterally, no wheezing, rhonchi or rales  Abd: soft, nontender, no hepatomegaly  Ext: no edema Musculoskeletal:  No deformities, BUE and BLE strength normal and equal Skin: warm and dry  Neuro:  CNs 2-12 intact, no focal abnormalities noted Psych:  Normal affect   EKG:  The EKG was  personally reviewed and demonstrates:  Initial EKG 4/28 showed atrial fibrillation w/ RVR 160s   Telemetry:  Telemetry was personally reviewed and demonstrates: Normal sinus rhythm heart rate 80 bpm  Relevant CV Studies: 2D Echo 11/02/18 Study Conclusions  - Left ventricle: The cavity size was normal. Wall thickness was   normal. Systolic function was vigorous. The estimated ejection   fraction was in the range of 65% to 70%. Wall motion was normal;   there were no regional wall motion abnormalities. - Aortic valve: There was moderate regurgitation. - Mitral valve: Valve area by continuity equation (using LVOT   flow): 2.33 cm^2. - Right atrium: The atrium was mildly dilated. - Pericardium, extracardiac: A trivial pericardial effusion was   identified.  [place] 06/25/2017 Conclusion   1. Elevated left and right heart diastolic filling pressures 2. Preserved cardiac output 3. No evidence of ventricular interdependence based on simultaneous LV/RV pressure recording  Summary: Probable biventricular diastolic dysfunction. No clear evidence of the hemodynamic sequelae of pericardial constriction   [facility] 12/18/2016 Conclusion     1st Mrg lesion, 30 %stenosed.  Ost 2nd Mrg to 2nd Mrg lesion, 20 %stenosed.  Mid Cx lesion, 0 %stenosed.  Ost LAD to Mid LAD lesion, 0 %stenosed.  Ost 1st Diag to 1st Diag lesion, 70 %stenosed.  1st Diag lesion, 99 %stenosed.  The left ventricular systolic function is normal.  LV end diastolic pressure is normal.  The left ventricular ejection fraction is greater than 65% by visual estimate.  There is no mitral valve regurgitation.   1. Double vessel CAD, stable.  2. Patent stent in the proximal to mid LAD with no restenosis 3. Patent stent in the mid Circumflex with no restenosis 4. The Diagonal branch is a small caliber vessel that is jailed by the LAD stent. There is severe disease in this small branch but PCI is not a good option 5. Small  non-dominant RCA 6. Normal LV systolic function  Recommendations: Continue medical management of CAD.    Coronary Diagrams   Diagnostic  Dominance: Left      Laboratory Data:  Chemistry Recent Labs  Lab 02/16/19 2326  NA 139  K 3.6  CL 102  CO2 23  GLUCOSE 244*  BUN 21*  CREATININE 0.94  CALCIUM 9.1  GFRNONAA >60  GFRAA >60  ANIONGAP 14    No results for input(s): PROT, ALBUMIN, AST, ALT, ALKPHOS, BILITOT in the last 168 hours. Hematology Recent Labs  Lab 02/16/19 2326  WBC 9.7  RBC 3.76*  HGB 11.9*  HCT 36.5  MCV 97.1  MCH 31.6  MCHC 32.6  RDW 11.9  PLT 329   Cardiac Enzymes Recent Labs  Lab 02/16/19 2326 02/17/19 0214 02/17/19 0704  TROPONINI <0.03 <0.03 <0.03   No results for input(s): TROPIPOC in the last 168 hours.  BNP Recent Labs  Lab 02/17/19 0214  BNP 46.5    DDimer No results for input(s): DDIMER in the last 168 hours.  Radiology/Studies:  Dg Chest Port 1 View  Result Date: 02/17/2019 CLINICAL DATA:  Initial evaluation for acute chest pain. EXAM: PORTABLE CHEST 1 VIEW COMPARISON:  Prior radiograph from 12/29/2018 FINDINGS: Cardiomegaly with prominence of the right atrial contour, stable. Coronary stent in place. Mediastinal silhouette within normal limits. Elevation of the left hemidiaphragm with postsurgical changes at the left lung apex, stable. Additional postsurgical changes present at the right upper lobe. No focal infiltrates. No edema or effusion. No visible pneumothorax. No acute osseous finding. IMPRESSION: 1. No active cardiopulmonary disease. 2. Chronic postsurgical changes involving the upper lobes bilaterally, left greater than right, stable. 3. Cardiomegaly, unchanged. Electronically Signed   By: Jeannine Boga M.D.   On: 02/17/2019 00:04    Assessment and Plan:   [name] is a 63 y.o. female with a hx of CAD s/p DES to [facility] and mLAD 07/11/15 (patent stents on repeat cath 11/2016),chronic pericardial effusion, h/o  chronic pericarditis with chronic pericardial rub treated intermittently with steroids and colchicine, PAF on Eliquis, chronic diastolic CHF, DM, HTN,remote h/o treatednon-small cell lung cancer, pulmonary fibrosis, aortic insufficiency, COPD and chronic anemia who is being seen today for the evaluation of symptomatic atrial fibrillation w/ RVR and pleuritic CP, at the request of Dr. Broadus John, Internal Medicine.   1. Atrial Fibrillation w/ RVR: V response in the 160s in the ED. Spontaneous conversion w/ IV Cardizem. Remains in NSR. No identifiable triggers. TSH, troponin, K and Mg normal. CXR negative for acute processes. No urinary symptoms. 2D echo pending. She reports full compliance w/ Eliquis.  Continue home Cardizem and Metoprolol for rate control. Given recent intermittent breakthrough atrial fibrillation, consider addition of AAD and outpatient referral to Afib clinic. Note: given history of pulmonary fibrosis and COPD, Amiodarone would not be a good option. CAD would prohibit use of Flecainide. ? Tikosyn.   2. Chest Pain: Pleuritic in nature. May be recurrence of her pericarditis, which she has a history of recurrent pericarditis and chronic pericardial effusion. F/u echo pending. Consider checking inflammatory markers, ESR and CRP. Consider treatment w/ colchicine. Her recent CP is not typical of unstable angina and her troponins are negative x 3.. 2D echo pending. Given reported full compliance w/ Eliquis, PE unlikely.   3. Chronic Pericardial Effusion: most recent echo 10/2018 showed trivial pericardial effusion. Repeat echo ordered given recent symptoms. Will will f/u on result.   4. CAD: s/p DES to [facility] and mLAD 07/11/15 (patent stents on repeat cath 11/2016). Her recent CP is not c/w ischemic CP and troponins are negative x 3. Doubt ACS. No plans for ischemic testing, unless significant findings on echocardiogram, such as new LV dysfunction or WMAs. Continue medical therapy w/ statin,  blocker  and ARB. Not currently on ASA due to Eliquis.   5. Chronic Diastolic HF: BNP normal at 46 and CXR w/o edema. Continue  blocker and Cardizem for HR and BP control. Continue spironolactone and lasix for volume control and BP.   6. HTN: controlled on current regimen. 135/73 this morning.  Continue to monitor.   7. DM: Hgb A1c 7.0. Continue home regimen.   8. H/o Non-small Cell Lung CA: treated w/ surgery, chemo and radiation.   9. Aortic Insuffiencey: moderate AI noted on echol 10/2018. Repeat echo pending.   10. Lipids: well controlled w/ pravastatin. Lipid panel this morning showed LDL to be at recommended goal for CAD, < 70 mg/dL, at 65 mg/dL. Continue current dose of pravastatin. TG 87. HDL however is mildly low at 39.   For questions or updates, please contact [place] Please consult [email or web address] for contact info under   Signed, Lyda Jester, PA-C  02/17/2019 9:14 AM  Patient seen, examined. Available data reviewed. Agree with findings, assessment, and plan as outlined by Lyda Jester, PA-C.  Exam findings outlined above reflect the personal findings of my examination of this patient today.  The patient is known to me from previous hospital admission with longstanding intermittent chest pain felt to have chronic pericarditis.  The patient does have coronary artery disease and is undergone PCI in the past with most recent heart catheterization in 2018 demonstrating patency of her stents.  She now presents with symptoms of atrial fibrillation with RVR.  She is been compliant with her home medical program which includes a calcium channel blocker and beta-blocker.  She has been chronically anticoagulated with oral apixaban.  2D echocardiogram completed today as outlined: 1. The left ventricle has hyperdynamic systolic function, with an ejection fraction of >65%. The cavity size was normal. There is moderately increased left ventricular wall thickness. Left ventricular diastolic  Doppler parameters are consistent with  pseudonormalization. Elevated left atrial and left ventricular end-diastolic pressures The E/e' is >15. No evidence of left ventricular regional wall motion abnormalities.  2. The right ventricle has normal systolic function. The cavity was normal. There is no increase in right ventricular wall thickness.  3. The mitral valve is grossly normal.  4. The tricuspid valve is grossly normal.  5. The aortic valve is tricuspid. Mild sclerosis of the aortic valve. Aortic valve regurgitation is mild to moderate by color flow Doppler. mildly increased gradient without stenosis of the aortic valve.  6. The aortic root and ascending aorta are normal in size and structure.  7. The inferior vena cava was dilated in size with <50% respiratory variability.  Fortunately the patient has spontaneously converted to sinus rhythm on IV Cardizem.  I will transition her back to oral Cardizem today.  Will increase her metoprolol.  I think it is reasonable to start colchicine for recurrent pericarditis although there are no objective findings at the time of this evaluation.  The patient is having more frequent heart palpitations and has rapid atrial fibrillation prompting this hospitalization.  Initiation of antiarrhythmic drug therapy seems reasonable.  Dofetilide might be the best choice considering her age, presence of lung disease, and presence of coronary artery disease. Multaq may be another consideration since she has converted to sinus rhythm. I will ask for EP input. Will wean off of NTG and will stop IV diltiazem. Will follow with you.  Sherren Mocha, M.D. 02/17/2019 12:24 PM

## 2019-02-18 LAB — BASIC METABOLIC PANEL
Anion gap: 7 (ref 5–15)
BUN: 17 mg/dL (ref 6–20)
CO2: 28 mmol/L (ref 22–32)
Calcium: 8.6 mg/dL — ABNORMAL LOW (ref 8.9–10.3)
Chloride: 103 mmol/L (ref 98–111)
Creatinine, Ser: 0.96 mg/dL (ref 0.44–1.00)
GFR calc Af Amer: >60 mL/min (ref >60)
GFR calc non Af Amer: >60 mL/min (ref >60)
Glucose, Bld: 149 mg/dL — ABNORMAL HIGH (ref 70–99)
Potassium: 4.3 mmol/L (ref 3.5–5.1)
Sodium: 138 mmol/L (ref 135–145)

## 2019-02-18 LAB — GLUCOSE, CAPILLARY
Glucose-Capillary: 142 mg/dL — ABNORMAL HIGH (ref 70–99)
Glucose-Capillary: 177 mg/dL — ABNORMAL HIGH (ref 70–99)
Glucose-Capillary: 177 mg/dL — ABNORMAL HIGH (ref 70–99)
Glucose-Capillary: 173 mg/dL — ABNORMAL HIGH (ref 70–99)

## 2019-02-18 LAB — MAGNESIUM: Magnesium: 1.9 mg/dL (ref 1.7–2.4)

## 2019-02-18 MED ORDER — DOFETILIDE 500 MCG PO CAPS: 500.0000 ug | ORAL_CAPSULE | Freq: Two times a day (BID) | ORAL | Status: DC

## 2019-02-18 MED ORDER — DOFETILIDE 500 MCG PO CAPS
500.0000 ug | ORAL_CAPSULE | Freq: Two times a day (BID) | ORAL | Status: DC
  Administered 2019-02-19 – 2019-02-21 (×5): 500 ug via ORAL
  Filled 2019-02-18 (×3): qty 1
  Filled 2019-02-18: qty 14
  Filled 2019-02-18: qty 1
  Filled 2019-02-18: qty 14
  Filled 2019-02-18: qty 1

## 2019-02-18 MED ORDER — ACETAMINOPHEN 325 MG PO TABS
650.0000 mg | ORAL_TABLET | Freq: Four times a day (QID) | ORAL | Status: DC | PRN
  Administered 2019-02-18 – 2019-02-20 (×2): 650 mg via ORAL
  Filled 2019-02-18 (×3): qty 2

## 2019-02-18 MED ORDER — MAGNESIUM SULFATE IN D5W 1-5 GM/100ML-% IV SOLN
1.0000 g | Freq: Once | INTRAVENOUS | Status: AC
  Administered 2019-02-18: 12:00:00 1 g via INTRAVENOUS
  Filled 2019-02-18: qty 100

## 2019-02-18 NOTE — Progress Notes (Signed)
1600 EKG is reviewed with dr. Curt Bears.  Measured QT 480-586ms  >> QTc 492-578ms Telemetry is reviewed, SR 60's, no arrhythmias, ectopy  Will not give Tikosyn dose tonight and check EKG again in the morning. Discussed with RN  Tommye Standard, PA-C

## 2019-02-18 NOTE — Progress Notes (Addendum)
RN called, notified pt was given 2 caps of 513mcg Tiksoyn (total 1064mcg dose) this AM.  Advised to make sure telemetry is watched closely, and make sure post dose EKG is completed.  Will follow QT cosely.  Pending this will decide regarding this evening dose. Dr. Curt Bears was made aware Discussed with RN and charge RN, safety zone will be completed.  Rneee Charlcie Cradle, PA-C  Addend Post dose EKG is reviewed, measured QT 543ms, QTc 577ms Mag 1G ordered Telemetry is SR, no arrhythmias Will repeat EKG later this afternoon D/w Dr. Gardenia Phlegm, PA-C

## 2019-02-18 NOTE — Progress Notes (Signed)
Progress Note  Patient Name: [name] Date of Encounter: 02/18/2019  Primary Cardiologist: Ena Dawley, MD   Subjective   Feeling well. No complaints  Inpatient Medications    Scheduled Meds:  apixaban  5 mg Oral BID   colchicine  0.6 mg Oral BID   diltiazem  240 mg Oral Daily   dofetilide  500 mcg Oral BID   furosemide  40 mg Oral Daily   insulin aspart  0-9 Units Subcutaneous TID WC   insulin aspart protamine- aspart  15 Units Subcutaneous Q supper   insulin aspart protamine- aspart  25 Units Subcutaneous Q breakfast   losartan  25 mg Oral Daily   metoprolol tartrate  25 mg Oral BID   pravastatin  20 mg Oral q1800   sodium chloride flush  3 mL Intravenous Q12H   spironolactone  25 mg Oral Daily   Continuous Infusions:  sodium chloride     PRN Meds: sodium chloride, hydrALAZINE, HYDROmorphone (DILAUDID) injection, methocarbamol, nitroGLYCERIN, sodium chloride flush   Vital Signs    Vitals:   02/17/19 1200 02/17/19 2031 02/18/19 0033 02/18/19 0446  BP: (!) 147/92 133/67 114/61 (!) 112/57  Pulse: 78 75  65  Resp: (!) 21   19  Temp:  98.2 F (36.8 C) 98.2 F (36.8 C) 98.4 F (36.9 C)  TempSrc:  Oral Oral Oral  SpO2: 97% 97%  94%  Weight:      Height:        Intake/Output Summary (Last 24 hours) at 02/18/2019 0715 Last data filed at 02/17/2019 0751 Gross per 24 hour  Intake 0 ml  Output --  Net 0 ml   Last 3 Weights 02/17/2019 02/16/2019 01/01/2019  Weight (lbs) 239 lb 10.2 oz 232 lb 236 lb 3.2 oz  Weight (kg) 108.7 kg 105.235 kg 107.14 kg      Telemetry    SR - Personally Reviewed  ECG    SR, measured QT 400, QTc is good - Personally Reviewed  Physical Exam    GEN: No acute distress.   Neck: No JVD Cardiac:  RRR, no murmurs, rubs, or gallops.  Respiratory:  CTA b/l. GI: Soft, nontender, non-distended  MS: No edema; No deformity. Neuro:  Nonfocal  Psych: Normal affect   Labs    Chemistry Recent Labs  Lab  02/16/19 2326 02/17/19 1337 02/17/19 1933 02/18/19 0318  NA 139 141  --  138  K 3.6 3.4* 4.2 4.3  CL 102 104  --  103  CO2 23 26  --  28  GLUCOSE 244* 142*  --  149*  BUN 21* 20  --  17  CREATININE 0.94 1.02*  --  0.96  CALCIUM 9.1 8.7*  --  8.6*  GFRNONAA >60 60*  --  >60  GFRAA >60 >60  --  >60  ANIONGAP 14 11  --  7     Hematology Recent Labs  Lab 02/16/19 2326  WBC 9.7  RBC 3.76*  HGB 11.9*  HCT 36.5  MCV 97.1  MCH 31.6  MCHC 32.6  RDW 11.9  PLT 329    Cardiac Enzymes Recent Labs  Lab 02/16/19 2326 02/17/19 0214 02/17/19 0704 02/17/19 1337  TROPONINI <0.03 <0.03 <0.03 <0.03   No results for input(s): TROPIPOC in the last 168 hours.   BNP Recent Labs  Lab 02/17/19 0214  BNP 46.5     DDimer No results for input(s): DDIMER in the last 168 hours.   Radiology  Dg Chest Port 1 View Result Date: 02/17/2019 CLINICAL DATA:  Initial evaluation for acute chest pain. EXAM: PORTABLE CHEST 1 VIEW COMPARISON:  Prior radiograph from 12/29/2018 FINDINGS: Cardiomegaly with prominence of the right atrial contour, stable. Coronary stent in place. Mediastinal silhouette within normal limits. Elevation of the left hemidiaphragm with postsurgical changes at the left lung apex, stable. Additional postsurgical changes present at the right upper lobe. No focal infiltrates. No edema or effusion. No visible pneumothorax. No acute osseous finding. IMPRESSION: 1. No active cardiopulmonary disease. 2. Chronic postsurgical changes involving the upper lobes bilaterally, left greater than right, stable. 3. Cardiomegaly, unchanged. Electronically Signed   By: Jeannine Boga M.D.   On: 02/17/2019 00:04    Cardiac Studies   02/17/2019: TTE IMPRESSIONS 1. The left ventricle has hyperdynamic systolic function, with an ejection fraction of >65%. The cavity size was normal. There is moderately increased left ventricular wall thickness. Left ventricular diastolic Doppler parameters  are consistent with  pseudonormalization. Elevated left atrial and left ventricular end-diastolic pressures The E/e' is >15. No evidence of left ventricular regional wall motion abnormalities. 2. The right ventricle has normal systolic function. The cavity was normal. There is no increase in right ventricular wall thickness. 3. The mitral valve is grossly normal. 4. The tricuspid valve is grossly normal. 5. The aortic valve is tricuspid. Mild sclerosis of the aortic valve. Aortic valve regurgitation is mild to moderate by color flow Doppler. mildly increased gradient without stenosis of the aortic valve. 6. The aortic root and ascending aorta are normal in size and structure. 7. The inferior vena cava was dilated in size with <50% respiratory variability.  SUMMARY  LVEF 65-70%, moderate LVH, normal wall motion, grade 2 DD, elevated LV filling pressure, aortic sclerosis with moderate AI, normal RV function, mild PI, trivial TR, dilated IVC, no significant pericardial effusion FINDINGS Left Ventricle: The left ventricle has hyperdynamic systolic function, with an ejection fraction of >65%. The cavity size was normal. There is moderately increased left ventricular wall thickness. Left ventricular diastolic Doppler parameters are  consistent with pseudonormalization. Elevated left atrial and left ventricular end-diastolic pressures The E/e' is >15. No evidence of left ventricular regional wall motion abnormalities..  Right Ventricle: The right ventricle has normal systolic function. The cavity was normal. There is no increase in right ventricular wall thickness.  Left Atrium: Left atrial size was normal in size.  Right Atrium: Right atrial size was normal in size. Right atrial pressure is estimated at 10 mmHg.  Interatrial Septum: No atrial level shunt detected by color flow Doppler.  Pericardium: There is no evidence of pericardial effusion.  Mitral Valve: The mitral valve  is grossly normal. Mitral valve regurgitation is trivial by color flow Doppler.  Tricuspid Valve: The tricuspid valve is grossly normal. Tricuspid valve regurgitation is trivial by color flow Doppler.  Aortic Valve: The aortic valve is tricuspid Mild sclerosis of the aortic valve. Aortic valve regurgitation is mild to moderate by color flow Doppler. There is no evidence of aortic valve stenosis. There is mildly increased gradient without stenosis of  the aortic valve.  Pulmonic Valve: The pulmonic valve was grossly normal. Pulmonic valve regurgitation is mild by color flow Doppler.  Aorta: The aortic root and ascending aorta are normal in size and structure.  Venous: The inferior vena cava is dilated in size with less than 50% respiratory variability.  06/25/17: RHC 1. Elevated left and right heart diastolic filling pressures 2. Preserved cardiac output 3. No evidence of  ventricular interdependence based on simultaneous LV/RV pressure recording  Summary: Probable biventricular diastolic dysfunction. No clear evidence of the hemodynamic sequelae of pericardial constriction   LHC 12/18/2016 Conclusion    1st Mrg lesion, 30 %stenosed.  Ost 2nd Mrg to 2nd Mrg lesion, 20 %stenosed.  Mid Cx lesion, 0 %stenosed.  Ost LAD to Mid LAD lesion, 0 %stenosed.  Ost 1st Diag to 1st Diag lesion, 70 %stenosed.  1st Diag lesion, 99 %stenosed.  The left ventricular systolic function is normal.  LV end diastolic pressure is normal.  The left ventricular ejection fraction is greater than 65% by visual estimate.  There is no mitral valve regurgitation.  1. Double vessel CAD, stable.  2. Patent stent in the proximal to mid LAD with no restenosis 3. Patent stent in the mid Circumflex with no restenosis 4. The Diagonal branch is a small caliber vessel that is jailed by the LAD stent. There is severe disease in this small branch but PCI is not a good option 5. Small non-dominant RCA 6.  Normal LV systolic function  Recommendations: Continue medical management of CAD.     Patient Profile     63 y.o. female with a hx of CAD (DES to [facility] and mLAD 07/11/15 >> patent stents on repeat cath 11/2016), chronic pericardial effusion, chronic pericarditis and pericardial rub, treated intermittently with steroids and colchicine, chronic CHF (diastolic), DM, HTN, remote NSCLC (tx surgically and chemo), COPD, pulmonary fibrosis (follows with Dr. Elsworth Soho) VHD with mild AI, and AFib admitted with palpitations/CP, found with AFib RVR  Assessment & Plan    1. Paroxysmal AFib     Symptomatic w/RVR     CHA2DS2Vasc is 4, on Eliquis, appropriately dosed Started on Tikosyn last PM K+ 4.3 Mag 1.9 Creat 0-.96, stable QTc looks OK Continue load  Appreciate pharmacy, recommend consideration of stopping Tizanidine on diacharge with possible risk QT prolongation   2. CP     Not felt to be anginal by cards team     Trop Neg  3. H/o recurrent pericarditis,.effusion     No effusion in this admission echo     Started on colchicine by cards  4. Chronic CHF (diastolic)     Continued home meds     Not felt to be acutely volume OL     For questions or updates, please contact [place] Please consult [email or web address] for contact info under        Signed, Baldwin Jamaica, PA-C  02/18/2019, 7:15 AM    I have seen and examined this patient with Tommye Standard.  Agree with above, note added to reflect my findings.  On exam, RRR, no murmurs, lungs clear. Patient admitted for AF with RVR. Tikosyn loading now. Plan for continued dosing. Received extra dose this AM, may need to hold PM dose pending ECG.    Lamone Ferrelli M. Quashaun Lazalde MD 02/18/2019 5:03 PM

## 2019-02-18 NOTE — Progress Notes (Signed)
PROGRESS NOTE    [name]  MPN:[id number] DOB: Oct 24, 1955 DOA: 02/16/2019 PCP: Glendale Chard, MD  Brief Narrative: 63 year old female with history of CAD, history of stenting to circumflex and LAD in 9/201 6, history of chronic pericardial effusion, paroxysmal atrial fibrillation on Eliquis, chronic diastolic CHF, diabetes, hypertension, remote history of non-small cell lung cancer treated surgically, pulmonary fibrosis, COPD, chronic anemia presented to the emergency room with atypical chest pain with some pressure-like and pleuritic discomfort in her chest, associated with atrial fibrillation with rapid ventricular response   Assessment & Plan:   Chest pain -Likely due to atrial fibrillation with RVR, no evidence of ACS at this time, history of CAD with mid circumflex and LAD stent in 9/201 9 -Last cath 2/201 8 with patent stents, current symptoms not consistent with ischemic chest pain, troponin negative x3 as well -Continue beta-blocker statin, not on aspirin due to concurrent Eliquis -Appreciate cardiology input, 2D echocardiogram with preserved EF, no pericardial effusion  Atrial fibrillation with rapid ventricular response -Converted to sinus rhythm with IV Cardizem -Continue oral Cardizem and metoprolol -EP following now, started on Tikosyn, unfortunately QTC is elevated, follow closely, keep K greater than 4 and mag - 2, getting EKG multiple times a day -poor candidate for amiodarone due to history of pulmonary fibrosis -Continue Eliquis   History of pericardial effusion -Intermittently treated in the past with colchicine -Repeat echo yesterday with no evidence of pericardial effusion  History of CAD -As above  Chronic diastolic CHF -appears euvolemic at this time, continue Lasix and Aldactone  Remote history of non-small cell lung cancer -Treated 20 years ago with surgery chemo and XRT  Type 2 diabetes mellitus -A1c is 7.0 -Continue insulin 75/25, lower dose from  baseline, CBGs are stable    Hypothyroidism -Continue Synthroid, TSH is within normal limits  DVT prophylaxis: Eliquis Code Status: Full code Family Communication: No family at bedside Disposition Plan: Home when cleared by cardiology pending Tikosyn load  Consultants:   Cardiology   Procedures:   Antimicrobials:    Subjective: -Feels better, no further chest discomfort  Objective: Vitals:   02/18/19 0851 02/18/19 0858 02/18/19 0947 02/18/19 1130  BP: (!) 116/59  (!) 121/57 118/61  Pulse: 67 73  69  Resp: 18  (!) 26 17  Temp: 98.3 F (36.8 C)   98.1 F (36.7 C)  TempSrc: Oral   Oral  SpO2: 95%   99%  Weight:      Height:        Intake/Output Summary (Last 24 hours) at 02/18/2019 1239 Last data filed at 02/18/2019 0800 Gross per 24 hour  Intake 0 ml  Output -  Net 0 ml   Filed Weights   02/16/19 2321 02/17/19 0306  Weight: 105.2 kg 108.7 kg    Examination: Gen: Awake, Alert, Oriented X 3, pleasant, no distress HEENT: PERRLA, Neck supple, no JVD Lungs: Good air movement bilaterally, CTAB CVS: S1-S2/regular rate rhythm Abd: soft, Non tender, non distended, BS present Extremities: No edema Skin: no new rashes Psychiatry: Judgement and insight appear normal. Mood & affect appropriate.     Data Reviewed:   CBC: Recent Labs  Lab 02/16/19 2326  WBC 9.7  HGB 11.9*  HCT 36.5  MCV 97.1  PLT 008   Basic Metabolic Panel: Recent Labs  Lab 02/16/19 2326 02/16/19 2341 02/17/19 1337 02/17/19 1933 02/18/19 0318  NA 139  --  141  --  138  K 3.6  --  3.4* 4.2 4.3  CL 102  --  104  --  103  CO2 23  --  26  --  28  GLUCOSE 244*  --  142*  --  149*  BUN 21*  --  20  --  17  CREATININE 0.94  --  1.02*  --  0.96  CALCIUM 9.1  --  8.7*  --  8.6*  MG  --  1.9 2.2  --  1.9   GFR: Estimated Creatinine Clearance: 79.9 mL/min (by C-G formula based on SCr of 0.96 mg/dL). Liver Function Tests: No results for input(s): AST, ALT, ALKPHOS, BILITOT, PROT,  ALBUMIN in the last 168 hours. No results for input(s): LIPASE, AMYLASE in the last 168 hours. No results for input(s): AMMONIA in the last 168 hours. Coagulation Profile: No results for input(s): INR, PROTIME in the last 168 hours. Cardiac Enzymes: Recent Labs  Lab 02/16/19 2326 02/17/19 0214 02/17/19 0704 02/17/19 1337  TROPONINI <0.03 <0.03 <0.03 <0.03   BNP (last 3 results) No results for input(s): PROBNP in the last 8760 hours. HbA1C: Recent Labs    02/17/19 0215  HGBA1C 7.0*   CBG: Recent Labs  Lab 02/17/19 1120 02/17/19 1559 02/17/19 2121 02/18/19 0633 02/18/19 1137  GLUCAP 123* 276* 175* 142* 177*   Lipid Profile: Recent Labs    02/17/19 0215  CHOL 121  HDL 39*  LDLCALC 65  TRIG 87  CHOLHDL 3.1   Thyroid Function Tests: Recent Labs    02/17/19 0215  TSH 2.471   Anemia Panel: No results for input(s): VITAMINB12, FOLATE, FERRITIN, TIBC, IRON, RETICCTPCT in the last 72 hours. Urine analysis:    Component Value Date/Time   COLORURINE YELLOW 09/01/2018 0804   APPEARANCEUR CLEAR 09/01/2018 0804   LABSPEC 1.017 09/01/2018 0804   PHURINE 5.0 09/01/2018 0804   GLUCOSEU NEGATIVE 09/01/2018 0804   GLUCOSEU NEGATIVE 08/14/2007 1248   HGBUR NEGATIVE 09/01/2018 0804   BILIRUBINUR NEGATIVE 09/01/2018 0804   BILIRUBINUR Negative 08/26/2018 1610   KETONESUR NEGATIVE 09/01/2018 0804   PROTEINUR NEGATIVE 09/01/2018 0804   UROBILINOGEN 1.0 08/26/2018 1610   UROBILINOGEN 0.2 11/04/2012 1622   NITRITE NEGATIVE 09/01/2018 0804   LEUKOCYTESUR NEGATIVE 09/01/2018 0804   Sepsis Labs: @LABRCNTIP (procalcitonin:4,lacticidven:4)  )No results found for this or any previous visit (from the past 240 hour(s)).       Radiology Studies: Dg Chest Port 1 View  Result Date: 02/17/2019 CLINICAL DATA:  Initial evaluation for acute chest pain. EXAM: PORTABLE CHEST 1 VIEW COMPARISON:  Prior radiograph from 12/29/2018 FINDINGS: Cardiomegaly with prominence of the right  atrial contour, stable. Coronary stent in place. Mediastinal silhouette within normal limits. Elevation of the left hemidiaphragm with postsurgical changes at the left lung apex, stable. Additional postsurgical changes present at the right upper lobe. No focal infiltrates. No edema or effusion. No visible pneumothorax. No acute osseous finding. IMPRESSION: 1. No active cardiopulmonary disease. 2. Chronic postsurgical changes involving the upper lobes bilaterally, left greater than right, stable. 3. Cardiomegaly, unchanged. Electronically Signed   By: Jeannine Boga M.D.   On: 02/17/2019 00:04        Scheduled Meds: . apixaban  5 mg Oral BID  . colchicine  0.6 mg Oral BID  . diltiazem  240 mg Oral Daily  . dofetilide  500 mcg Oral BID  . furosemide  40 mg Oral Daily  . insulin aspart  0-9 Units Subcutaneous TID WC  . insulin aspart protamine- aspart  15 Units Subcutaneous Q supper  . insulin aspart  protamine- aspart  25 Units Subcutaneous Q breakfast  . losartan  25 mg Oral Daily  . metoprolol tartrate  25 mg Oral BID  . pravastatin  20 mg Oral q1800  . sodium chloride flush  3 mL Intravenous Q12H  . spironolactone  25 mg Oral Daily   Continuous Infusions: . sodium chloride    . magnesium sulfate 1 - 4 g bolus IVPB 1 g (02/18/19 1154)     LOS: 1 day    Time spent: 50min    Domenic Polite, MD [facility]  02/18/2019, 12:39 PM

## 2019-02-19 ENCOUNTER — Inpatient Hospital Stay (HOSPITAL_COMMUNITY): Payer: Medicare Other

## 2019-02-19 DIAGNOSIS — I82402 Acute embolism and thrombosis of unspecified deep veins of left lower extremity: Secondary | ICD-10-CM

## 2019-02-19 LAB — BASIC METABOLIC PANEL
Anion gap: 10 (ref 5–15)
BUN: 22 mg/dL — ABNORMAL HIGH (ref 6–20)
CO2: 28 mmol/L (ref 22–32)
Calcium: 8.9 mg/dL (ref 8.9–10.3)
Chloride: 100 mmol/L (ref 98–111)
Creatinine, Ser: 1.13 mg/dL — ABNORMAL HIGH (ref 0.44–1.00)
GFR calc Af Amer: >60 mL/min (ref >60)
GFR calc non Af Amer: 53 mL/min — ABNORMAL LOW (ref >60)
Glucose, Bld: 148 mg/dL — ABNORMAL HIGH (ref 70–99)
Potassium: 4.5 mmol/L (ref 3.5–5.1)
Sodium: 138 mmol/L (ref 135–145)

## 2019-02-19 LAB — GLUCOSE, CAPILLARY
Glucose-Capillary: 133 mg/dL — ABNORMAL HIGH (ref 70–99)
Glucose-Capillary: 245 mg/dL — ABNORMAL HIGH (ref 70–99)
Glucose-Capillary: 139 mg/dL — ABNORMAL HIGH (ref 70–99)
Glucose-Capillary: 223 mg/dL — ABNORMAL HIGH (ref 70–99)

## 2019-02-19 LAB — MAGNESIUM: Magnesium: 2.1 mg/dL (ref 1.7–2.4)

## 2019-02-19 MED ORDER — KETOROLAC TROMETHAMINE 30 MG/ML IJ SOLN
30.0000 mg | Freq: Once | INTRAMUSCULAR | Status: AC
  Administered 2019-02-19: 02:00:00 30 mg via INTRAVENOUS
  Filled 2019-02-19: qty 1

## 2019-02-19 MED ORDER — BUTALBITAL-APAP-CAFFEINE 50-325-40 MG PO TABS
2.0000 | ORAL_TABLET | Freq: Once | ORAL | Status: AC
  Administered 2019-02-19: 2 via ORAL
  Filled 2019-02-19: qty 2

## 2019-02-19 MED ORDER — BUTALBITAL-APAP-CAFFEINE 50-325-40 MG PO TABS
1.0000 | ORAL_TABLET | Freq: Once | ORAL | Status: AC
  Administered 2019-02-19: 12:00:00 1 via ORAL
  Filled 2019-02-19: qty 1

## 2019-02-19 MED ORDER — METOCLOPRAMIDE HCL 5 MG/ML IJ SOLN
10.0000 mg | Freq: Once | INTRAMUSCULAR | Status: AC
  Administered 2019-02-19: 02:00:00 10 mg via INTRAVENOUS
  Filled 2019-02-19: qty 2

## 2019-02-19 MED ORDER — DIPHENHYDRAMINE HCL 50 MG/ML IJ SOLN
25.0000 mg | Freq: Once | INTRAMUSCULAR | Status: AC
  Administered 2019-02-19: 02:00:00 25 mg via INTRAVENOUS
  Filled 2019-02-19: qty 1

## 2019-02-19 MED ORDER — BUTALBITAL-APAP-CAFFEINE 50-325-40 MG PO TABS
1.0000 | ORAL_TABLET | Freq: Once | ORAL | Status: AC
  Administered 2019-02-19: 21:00:00 1 via ORAL
  Filled 2019-02-19: qty 1

## 2019-02-19 NOTE — Progress Notes (Signed)
Left lower extremity venous duplex completed. Preliminary results in Chart review CV proc. [place] Slaughter,RVS 02/19/2019, 4:29 PM

## 2019-02-19 NOTE — Progress Notes (Signed)
Progress Note  Patient Name: [name] Date of Encounter: 02/19/2019  Primary Cardiologist: Ena Dawley, MD   Subjective   Feeling well without complaint  Inpatient Medications    Scheduled Meds:  apixaban  5 mg Oral BID   colchicine  0.6 mg Oral BID   diltiazem  240 mg Oral Daily   dofetilide  500 mcg Oral BID   furosemide  40 mg Oral Daily   insulin aspart  0-9 Units Subcutaneous TID WC   insulin aspart protamine- aspart  15 Units Subcutaneous Q supper   insulin aspart protamine- aspart  25 Units Subcutaneous Q breakfast   losartan  25 mg Oral Daily   metoprolol tartrate  25 mg Oral BID   pravastatin  20 mg Oral q1800   sodium chloride flush  3 mL Intravenous Q12H   spironolactone  25 mg Oral Daily   Continuous Infusions:  sodium chloride     PRN Meds: sodium chloride, acetaminophen, hydrALAZINE, HYDROmorphone (DILAUDID) injection, methocarbamol, nitroGLYCERIN, sodium chloride flush   Vital Signs    Vitals:   02/18/19 1612 02/18/19 1929 02/19/19 0001 02/19/19 0442  BP: (!) 111/54 (!) 115/55 (!) 112/59 (!) 116/57  Pulse:   62 73  Resp: 18 (!) 27 (!) 24 (!) 25  Temp: 98 F (36.7 C) 98.1 F (36.7 C) 98.1 F (36.7 C) 97.9 F (36.6 C)  TempSrc: Oral Oral  Oral  SpO2: 99% 99% 95% 94%  Weight:      Height:        Intake/Output Summary (Last 24 hours) at 02/19/2019 0735 Last data filed at 02/18/2019 2130 Gross per 24 hour  Intake 660 ml  Output --  Net 660 ml   Last 3 Weights 02/17/2019 02/16/2019 01/01/2019  Weight (lbs) 239 lb 10.2 oz 232 lb 236 lb 3.2 oz  Weight (kg) 108.7 kg 105.235 kg 107.14 kg      Telemetry    SR/SB, generally 60's - Personally Reviewed  ECG    SB, measured QT 480, QTc 434ms - Personally Reviewed  Physical Exam    Exam is unchanged GEN: No acute distress.   Neck: No JVD Cardiac:  RRR, no murmurs, rubs, or gallops.  Respiratory:  CTA b/l. GI: Soft, nontender, non-distended  MS: No edema; No  deformity. Neuro:  Nonfocal  Psych: Normal affect   Labs    Chemistry Recent Labs  Lab 02/17/19 1337 02/17/19 1933 02/18/19 0318 02/19/19 0240  NA 141  --  138 138  K 3.4* 4.2 4.3 4.5  CL 104  --  103 100  CO2 26  --  28 28  GLUCOSE 142*  --  149* 148*  BUN 20  --  17 22*  CREATININE 1.02*  --  0.96 1.13*  CALCIUM 8.7*  --  8.6* 8.9  GFRNONAA 60*  --  >60 53*  GFRAA >60  --  >60 >60  ANIONGAP 11  --  7 10     Hematology Recent Labs  Lab 02/16/19 2326  WBC 9.7  RBC 3.76*  HGB 11.9*  HCT 36.5  MCV 97.1  MCH 31.6  MCHC 32.6  RDW 11.9  PLT 329    Cardiac Enzymes Recent Labs  Lab 02/16/19 2326 02/17/19 0214 02/17/19 0704 02/17/19 1337  TROPONINI <0.03 <0.03 <0.03 <0.03   No results for input(s): TROPIPOC in the last 168 hours.   BNP Recent Labs  Lab 02/17/19 0214  BNP 46.5     DDimer No results for  input(s): DDIMER in the last 168 hours.   Radiology    Dg Chest Port 1 View Result Date: 02/17/2019 CLINICAL DATA:  Initial evaluation for acute chest pain. EXAM: PORTABLE CHEST 1 VIEW COMPARISON:  Prior radiograph from 12/29/2018 FINDINGS: Cardiomegaly with prominence of the right atrial contour, stable. Coronary stent in place. Mediastinal silhouette within normal limits. Elevation of the left hemidiaphragm with postsurgical changes at the left lung apex, stable. Additional postsurgical changes present at the right upper lobe. No focal infiltrates. No edema or effusion. No visible pneumothorax. No acute osseous finding. IMPRESSION: 1. No active cardiopulmonary disease. 2. Chronic postsurgical changes involving the upper lobes bilaterally, left greater than right, stable. 3. Cardiomegaly, unchanged. Electronically Signed   By: Jeannine Boga M.D.   On: 02/17/2019 00:04    Cardiac Studies   02/17/2019: TTE IMPRESSIONS 1. The left ventricle has hyperdynamic systolic function, with an ejection fraction of >65%. The cavity size was normal. There is  moderately increased left ventricular wall thickness. Left ventricular diastolic Doppler parameters are consistent with  pseudonormalization. Elevated left atrial and left ventricular end-diastolic pressures The E/e' is >15. No evidence of left ventricular regional wall motion abnormalities. 2. The right ventricle has normal systolic function. The cavity was normal. There is no increase in right ventricular wall thickness. 3. The mitral valve is grossly normal. 4. The tricuspid valve is grossly normal. 5. The aortic valve is tricuspid. Mild sclerosis of the aortic valve. Aortic valve regurgitation is mild to moderate by color flow Doppler. mildly increased gradient without stenosis of the aortic valve. 6. The aortic root and ascending aorta are normal in size and structure. 7. The inferior vena cava was dilated in size with <50% respiratory variability.  SUMMARY  LVEF 65-70%, moderate LVH, normal wall motion, grade 2 DD, elevated LV filling pressure, aortic sclerosis with moderate AI, normal RV function, mild PI, trivial TR, dilated IVC, no significant pericardial effusion FINDINGS Left Ventricle: The left ventricle has hyperdynamic systolic function, with an ejection fraction of >65%. The cavity size was normal. There is moderately increased left ventricular wall thickness. Left ventricular diastolic Doppler parameters are  consistent with pseudonormalization. Elevated left atrial and left ventricular end-diastolic pressures The E/e' is >15. No evidence of left ventricular regional wall motion abnormalities..  Right Ventricle: The right ventricle has normal systolic function. The cavity was normal. There is no increase in right ventricular wall thickness.  Left Atrium: Left atrial size was normal in size.  Right Atrium: Right atrial size was normal in size. Right atrial pressure is estimated at 10 mmHg.  Interatrial Septum: No atrial level shunt detected by color flow  Doppler.  Pericardium: There is no evidence of pericardial effusion.  Mitral Valve: The mitral valve is grossly normal. Mitral valve regurgitation is trivial by color flow Doppler.  Tricuspid Valve: The tricuspid valve is grossly normal. Tricuspid valve regurgitation is trivial by color flow Doppler.  Aortic Valve: The aortic valve is tricuspid Mild sclerosis of the aortic valve. Aortic valve regurgitation is mild to moderate by color flow Doppler. There is no evidence of aortic valve stenosis. There is mildly increased gradient without stenosis of  the aortic valve.  Pulmonic Valve: The pulmonic valve was grossly normal. Pulmonic valve regurgitation is mild by color flow Doppler.  Aorta: The aortic root and ascending aorta are normal in size and structure.  Venous: The inferior vena cava is dilated in size with less than 50% respiratory variability.  06/25/17: RHC 1. Elevated left and  right heart diastolic filling pressures 2. Preserved cardiac output 3. No evidence of ventricular interdependence based on simultaneous LV/RV pressure recording  Summary: Probable biventricular diastolic dysfunction. No clear evidence of the hemodynamic sequelae of pericardial constriction   LHC 12/18/2016 Conclusion    1st Mrg lesion, 30 %stenosed.  Ost 2nd Mrg to 2nd Mrg lesion, 20 %stenosed.  Mid Cx lesion, 0 %stenosed.  Ost LAD to Mid LAD lesion, 0 %stenosed.  Ost 1st Diag to 1st Diag lesion, 70 %stenosed.  1st Diag lesion, 99 %stenosed.  The left ventricular systolic function is normal.  LV end diastolic pressure is normal.  The left ventricular ejection fraction is greater than 65% by visual estimate.  There is no mitral valve regurgitation.  1. Double vessel CAD, stable.  2. Patent stent in the proximal to mid LAD with no restenosis 3. Patent stent in the mid Circumflex with no restenosis 4. The Diagonal branch is a small caliber vessel that is jailed by the LAD  stent. There is severe disease in this small branch but PCI is not a good option 5. Small non-dominant RCA 6. Normal LV systolic function  Recommendations: Continue medical management of CAD.     Patient Profile     63 y.o. female with a hx of CAD (DES to [facility] and mLAD 07/11/15 >> patent stents on repeat cath 11/2016), chronic pericardial effusion, chronic pericarditis and pericardial rub, treated intermittently with steroids and colchicine, chronic CHF (diastolic), DM, HTN, remote NSCLC (tx surgically and chemo), COPD, pulmonary fibrosis (follows with Dr. Elsworth Soho) VHD with mild AI, and AFib admitted with palpitations/CP, found with AFib RVR  Assessment & Plan    1. Paroxysmal AFib     Symptomatic w/RVR     CHA2DS2Vasc is 4, on Eliquis, appropriately dosed Started on Tikosyn last PM K+ 4.5 Mag 2.1 Creat 1.13, cal CrCl is 91  Pt received 1058mcg dose yesterday AM with QT prolongation, no dose last night Telemetry without arrhythmia or significant ectopy QT this AM is a bit better I measure QT 480/QTc 486ms by this AM EKG EKG reviewed by Dr. Curt Bears OK to resume this AM  Appreciate pharmacy, recommend consideration of stopping Tizanidine on diacharge with possible risk QT prolongation   2. CP     Not felt to be anginal by cards team     Trop Neg  3. H/o recurrent pericarditis,.effusion     No effusion in this admission echo     Started on colchicine by cards  4. Chronic CHF (diastolic)     Continued home meds     Not felt to be acutely volume OL     For questions or updates, please contact [place] Please consult [email or web address] for contact info under        Signed, Baldwin Jamaica, PA-C  02/19/2019, 7:35 AM     I have seen and examined this patient with Tommye Standard.  Agree with above, note added to reflect my findings.  On exam, RRR, no murmurs, lungs clear. QTc remains stable. Plan for continued loading at 400 mcg.    Talon Regala M. Liv Rallis MD 02/19/2019 5:31 PM

## 2019-02-19 NOTE — Progress Notes (Signed)
Pt assisted to ambulate approx 800 ft in all.  No complaints, no tele alarms, maintained sinus rhythm throughout.  Used hand rails, gait slow and steady.  Will con't plan of care.

## 2019-02-19 NOTE — TOC Initial Note (Signed)
Transition of Care (TOC) - Initial/Assessment Note  Marvetta Gibbons RN, BSN Transitions of Care Unit 4E- RN Case Manager [phone]  Patient Details  Name: [name] MRN: [id number] Date of Birth: 1956-03-06  Transition of Care [facility]) CM/SW Contact:    Dawayne Patricia, RN Phone Number: 02/19/2019, 3:33 PM  Clinical Narrative:                 Pt admitted with afib, started on Tikosyn load, benefits check submitted for copay cost which is $61.08. CM spoke with pt at bedside to inform of cost- pt also has another med that cost $80- and has some concerns about adding another med with higher copay- advised pt to speak with medical team regarding this. If pt stays on the Tikosyn for transition home- will need 7 day supply from [facility] on discharge with script also going to her main pharmacy in order for them to order drug in stock. CM will continue to follow.   Expected Discharge Plan: Home/Self Care Barriers to Discharge: Continued Medical Work up   Patient Goals and CMS Choice Patient states their goals for this hospitalization and ongoing recovery are:: "to get home, concerned about affording meds" CMS Medicare.gov Compare Post Acute Care list provided to:: Patient    Expected Discharge Plan and Services Expected Discharge Plan: Home/Self Care   Discharge Planning Services: CM Consult, Medication Assistance   Living arrangements for the past 2 months: Single Family Home                                      Prior Living Arrangements/Services Living arrangements for the past 2 months: Single Family Home Lives with:: Self Patient language and need for interpreter reviewed:: Yes        Need for Family Participation in Patient Care: No (Comment) Care giver support system in place?: Yes (comment)   Criminal Activity/Legal Involvement Pertinent to Current Situation/Hospitalization: No - Comment as needed  Activities of Daily Living Home Assistive  Devices/Equipment: Cane (specify quad or straight) ADL Screening (condition at time of admission) Patient's cognitive ability adequate to safely complete daily activities?: Yes Is the patient deaf or have difficulty hearing?: No Does the patient have difficulty seeing, even when wearing glasses/contacts?: No Does the patient have difficulty concentrating, remembering, or making decisions?: No Patient able to express need for assistance with ADLs?: Yes Does the patient have difficulty dressing or bathing?: No Independently performs ADLs?: Yes (appropriate for developmental age) Does the patient have difficulty walking or climbing stairs?: No Weakness of Legs: None Weakness of Arms/Hands: None  Permission Sought/Granted                  Emotional Assessment Appearance:: Appears stated age Attitude/Demeanor/Rapport: Engaged Affect (typically observed): Appropriate, Pleasant Orientation: : Oriented to Self, Oriented to Place, Oriented to  Time, Oriented to Situation   Psych Involvement: No (comment)  Admission diagnosis:  Precordial pain [R07.2] Atrial fibrillation with RVR ([place]) [I48.91] Chest pain [R07.9] Patient Active Problem List   Diagnosis Date Noted  . Hypothyroidism 02/17/2019  . Non-small cell lung cancer ([place]) 01/19/2019  . Cellulitis of fourth toe, left 01/01/2019  . Diverticulosis of colon without hemorrhage   . Chronic diastolic CHF (congestive heart failure) ([place]) 08/04/2017  . 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 Unstable angina ([place]) 04/12/2017  . Atrial fibrillation with RVR ([place])   .  Hyperlipidemia 08/07/2015  . CAD S/P percutaneous coronary angioplasty 07/25/2015  . Hypertension, essential   . Aortic insufficiency   . Stented coronary artery   . Abnormal nuclear stress test 07/11/2015  . Abnormal stress test 07/04/2015  . Pericarditis   . Controlled type 2 diabetes mellitus without complication, with long-term  current use of insulin ([place])   . Pericardial effusion   . Chest pain 05/30/2015  . 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 Open angle with borderline findings and low glaucoma risk in both eyes 05/23/2015  . Restrictive lung disease 03/14/2015  . CAD (coronary artery disease) 10/21/2014  . Carcinoma of hilus of lung ([place]) 01/20/2012  . Insomnia   . Obesity   . Anemia   . Kidney stones   . UNDIAGNOSED CARDIAC MURMURS 02/24/2009  . Lung cancer ([place]) 09/20/2008  . DYSPNEA 09/20/2008  . DIABETES MELLITUS, TYPE II 06/19/2007  . Spontaneous pneumothorax 10/21/2005   PCP:  Glendale Chard, MD Pharmacy:   [id number] - [place], [address] [place] [address] Phone: [phone] Fax: [place], [place] - [address] [place] [address] Phone: [phone] Fax: [phone]     Social Determinants of Health (SDOH) Interventions    Readmission Risk Interventions No flowsheet data found.

## 2019-02-19 NOTE — Progress Notes (Signed)
PROGRESS NOTE    [name]  CXK:[id number] DOB: 1956/02/25 DOA: 02/16/2019 PCP: Glendale Chard, MD  Brief Narrative: 63 year old female with history of CAD, history of stenting to circumflex and LAD in 9/201 6, history of chronic pericardial effusion, paroxysmal atrial fibrillation on Eliquis, chronic diastolic CHF, diabetes, hypertension, remote history of non-small cell lung cancer treated surgically, pulmonary fibrosis, COPD, chronic anemia presented to the emergency room with atypical chest pain with some pressure-like and pleuritic discomfort in her chest, associated with atrial fibrillation with rapid ventricular response. -Clinically improved, ruled out for ACS, echo with preserved EF, no pericardial effusion, converted to sinus rhythm, EP following now started on Tikosyn   Assessment & Plan:   Chest pain -Likely due to atrial fibrillation with RVR, no evidence of ACS at this time, history of CAD with mid circumflex and LAD stent in 9/201 9 -Last cath 2/201 8 with patent stents, symptoms not consistent with ACS , troponin negative x3  -Continue beta-blocker statin, not on aspirin due to concurrent Eliquis -Appreciate cardiology input, 2D echocardiogram with preserved EF, no pericardial effusion -Appears to have resolved at this time  Atrial fibrillation with rapid ventricular response -Converted to sinus rhythm with IV Cardizem -Continue oral Cardizem and metoprolol -EP following now, started on Tikosyn 4/29pm,  -unfortunately QTC is slightly  elevated, K 4.5 today, mag 2.1 -poor candidate for amiodarone due to history of pulmonary fibrosis -Continue Eliquis   L calf pain -check dopplers, dvt less likely given anticoagulation  History of pericardial effusion -Intermittently treated in the past with colchicine -Repeat echo with no evidence of pericardial effusion  History of CAD -As above  Chronic diastolic CHF -appears euvolemic at this time, continue Lasix and  Aldactone  Remote history of non-small cell lung cancer -Treated 20 years ago with surgery chemo and XRT  Type 2 diabetes mellitus -A1c is 7.0 -Continue insulin 75/25, lower dose from baseline, CBGs are stable    Hypothyroidism -Continue Synthroid, TSH is within normal limits  DVT prophylaxis: Eliquis Code Status: Full code Family Communication: No family at bedside Disposition Plan: Home when cleared by cardiology pending Tikosyn load  Consultants:   Cardiology   Procedures:   Antimicrobials:    Subjective: -c/o headache, no chest pain  Objective: Vitals:   02/19/19 0001 02/19/19 0442 02/19/19 0745 02/19/19 1120  BP: (!) 112/59 (!) 116/57 128/74 (!) 105/58  Pulse: 62 73 (!) 55 68  Resp: (!) 24 (!) 25 (!) 22 (!) 21  Temp: 98.1 F (36.7 C) 97.9 F (36.6 C) 97.7 F (36.5 C) 97.7 F (36.5 C)  TempSrc:  Oral Oral Oral  SpO2: 95% 94% 96% 95%  Weight:      Height:        Intake/Output Summary (Last 24 hours) at 02/19/2019 1413 Last data filed at 02/19/2019 1044 Gross per 24 hour  Intake 540 ml  Output 150 ml  Net 390 ml   Filed Weights   02/16/19 2321 02/17/19 0306  Weight: 105.2 kg 108.7 kg    Examination: Gen: Awake, Alert, Oriented X 3, pleasant, no distress HEENT: PERRLA, Neck supple, no JVD Lungs: Good air movement bilaterally, CTAB CVS: S1S2/RRR Abd: soft, Non tender, non distended, BS present Extremities: No edema Skin: no new rashes Psychiatry: Judgement and insight appear normal. Mood & affect appropriate.     Data Reviewed:   CBC: Recent Labs  Lab 02/16/19 2326  WBC 9.7  HGB 11.9*  HCT 36.5  MCV 97.1  PLT 329  Basic Metabolic Panel: Recent Labs  Lab 02/16/19 2326 02/16/19 2341 02/17/19 1337 02/17/19 1933 02/18/19 0318 02/19/19 0240  NA 139  --  141  --  138 138  K 3.6  --  3.4* 4.2 4.3 4.5  CL 102  --  104  --  103 100  CO2 23  --  26  --  28 28  GLUCOSE 244*  --  142*  --  149* 148*  BUN 21*  --  20  --  17 22*   CREATININE 0.94  --  1.02*  --  0.96 1.13*  CALCIUM 9.1  --  8.7*  --  8.6* 8.9  MG  --  1.9 2.2  --  1.9 2.1   GFR: Estimated Creatinine Clearance: 67.9 mL/min (A) (by C-G formula based on SCr of 1.13 mg/dL (H)). Liver Function Tests: No results for input(s): AST, ALT, ALKPHOS, BILITOT, PROT, ALBUMIN in the last 168 hours. No results for input(s): LIPASE, AMYLASE in the last 168 hours. No results for input(s): AMMONIA in the last 168 hours. Coagulation Profile: No results for input(s): INR, PROTIME in the last 168 hours. Cardiac Enzymes: Recent Labs  Lab 02/16/19 2326 02/17/19 0214 02/17/19 0704 02/17/19 1337  TROPONINI <0.03 <0.03 <0.03 <0.03   BNP (last 3 results) No results for input(s): PROBNP in the last 8760 hours. HbA1C: Recent Labs    02/17/19 0215  HGBA1C 7.0*   CBG: Recent Labs  Lab 02/18/19 1137 02/18/19 1612 02/18/19 2101 02/19/19 0628 02/19/19 1122  GLUCAP 177* 177* 173* 133* 245*   Lipid Profile: Recent Labs    02/17/19 0215  CHOL 121  HDL 39*  LDLCALC 65  TRIG 87  CHOLHDL 3.1   Thyroid Function Tests: Recent Labs    02/17/19 0215  TSH 2.471   Anemia Panel: No results for input(s): VITAMINB12, FOLATE, FERRITIN, TIBC, IRON, RETICCTPCT in the last 72 hours. Urine analysis:    Component Value Date/Time   COLORURINE YELLOW 09/01/2018 0804   APPEARANCEUR CLEAR 09/01/2018 0804   LABSPEC 1.017 09/01/2018 0804   PHURINE 5.0 09/01/2018 0804   GLUCOSEU NEGATIVE 09/01/2018 0804   GLUCOSEU NEGATIVE 08/14/2007 1248   HGBUR NEGATIVE 09/01/2018 0804   BILIRUBINUR NEGATIVE 09/01/2018 0804   BILIRUBINUR Negative 08/26/2018 1610   KETONESUR NEGATIVE 09/01/2018 0804   PROTEINUR NEGATIVE 09/01/2018 0804   UROBILINOGEN 1.0 08/26/2018 1610   UROBILINOGEN 0.2 11/04/2012 1622   NITRITE NEGATIVE 09/01/2018 0804   LEUKOCYTESUR NEGATIVE 09/01/2018 0804   Sepsis Labs: @LABRCNTIP (procalcitonin:4,lacticidven:4)  )No results found for this or any  previous visit (from the past 240 hour(s)).       Radiology Studies: No results found.      Scheduled Meds: . apixaban  5 mg Oral BID  . colchicine  0.6 mg Oral BID  . diltiazem  240 mg Oral Daily  . dofetilide  500 mcg Oral BID  . furosemide  40 mg Oral Daily  . insulin aspart  0-9 Units Subcutaneous TID WC  . insulin aspart protamine- aspart  15 Units Subcutaneous Q supper  . insulin aspart protamine- aspart  25 Units Subcutaneous Q breakfast  . losartan  25 mg Oral Daily  . metoprolol tartrate  25 mg Oral BID  . pravastatin  20 mg Oral q1800  . sodium chloride flush  3 mL Intravenous Q12H  . spironolactone  25 mg Oral Daily   Continuous Infusions: . sodium chloride       LOS: 2 days  Time spent: 34min    Domenic Polite, MD [facility]  02/19/2019, 2:13 PM

## 2019-02-20 DIAGNOSIS — I5032 Chronic diastolic (congestive) heart failure: Secondary | ICD-10-CM

## 2019-02-20 DIAGNOSIS — I10 Essential (primary) hypertension: Secondary | ICD-10-CM

## 2019-02-20 DIAGNOSIS — Z9861 Coronary angioplasty status: Secondary | ICD-10-CM

## 2019-02-20 DIAGNOSIS — R079 Chest pain, unspecified: Secondary | ICD-10-CM

## 2019-02-20 LAB — BASIC METABOLIC PANEL
Anion gap: 8 (ref 5–15)
BUN: 32 mg/dL — ABNORMAL HIGH (ref 6–20)
CO2: 27 mmol/L (ref 22–32)
Calcium: 8.9 mg/dL (ref 8.9–10.3)
Chloride: 102 mmol/L (ref 98–111)
Creatinine, Ser: 1.18 mg/dL — ABNORMAL HIGH (ref 0.44–1.00)
GFR calc Af Amer: 58 mL/min — ABNORMAL LOW (ref >60)
GFR calc non Af Amer: 50 mL/min — ABNORMAL LOW (ref >60)
Glucose, Bld: 145 mg/dL — ABNORMAL HIGH (ref 70–99)
Potassium: 4.9 mmol/L (ref 3.5–5.1)
Sodium: 137 mmol/L (ref 135–145)

## 2019-02-20 LAB — GLUCOSE, CAPILLARY
Glucose-Capillary: 140 mg/dL — ABNORMAL HIGH (ref 70–99)
Glucose-Capillary: 161 mg/dL — ABNORMAL HIGH (ref 70–99)
Glucose-Capillary: 160 mg/dL — ABNORMAL HIGH (ref 70–99)
Glucose-Capillary: 121 mg/dL — ABNORMAL HIGH (ref 70–99)

## 2019-02-20 LAB — MAGNESIUM: Magnesium: 2.3 mg/dL (ref 1.7–2.4)

## 2019-02-20 NOTE — Progress Notes (Addendum)
PROGRESS NOTE    [name]  QBH:[id number] DOB: 1955-12-30 DOA: 02/16/2019 PCP: Glendale Chard, MD  Brief Narrative: 63 year old female with history of CAD, history of stenting to circumflex and LAD in 9/201 6, history of chronic pericardial effusion, paroxysmal atrial fibrillation on Eliquis, chronic diastolic CHF, diabetes, hypertension, remote history of non-small cell lung cancer treated surgically, pulmonary fibrosis, COPD, chronic anemia presented to the emergency room with atypical chest pain with some pressure-like and pleuritic discomfort in her chest, associated with atrial fibrillation with rapid ventricular response. -Clinically improved, ruled out for ACS, echo with preserved EF, no pericardial effusion, converted to sinus rhythm, EP following now started on Tikosyn   Assessment & Plan:   Chest pain -Likely due to atrial fibrillation with RVR, no evidence of ACS at this time, history of CAD with mid circumflex and LAD stent in 9/201 9 -Last cath 2/201 8 with patent stents, symptoms not consistent with ACS , troponin negative x3  -Continue beta-blocker statin, not on aspirin due to concurrent Eliquis -Appreciate cardiology input, 2D echocardiogram with preserved EF, no pericardial effusion -Resolved  Atrial fibrillation with rapid ventricular response -Converted to sinus rhythm with IV Cardizem -Continue oral Cardizem and metoprolol -EP following now, started on Tikosyn 4/29pm,  -Discussed with staff, last dose of Tikosyn load will be tonight -K is 4.9, mag is 2.3 -QTC is relatively stable at 496 today -Candidate for amiodarone due to history of pulmonary fibrosis -Continue Eliquis -Home tomorrow  L calf pain -Likely muscular, Dopplers negative for DVT  History of pericardial effusion -Intermittently treated in the past with colchicine -Repeat echo with no evidence of pericardial effusion  History of CAD -As above  Chronic diastolic CHF -appears euvolemic at  this time, continue Lasix and Aldactone  Remote history of non-small cell lung cancer -Treated 20 years ago with surgery chemo and XRT  Type 2 diabetes mellitus -A1c is 7.0 -Continue insulin 75/25, lower dose from baseline, CBGs are stable    Hypothyroidism -Continue Synthroid, TSH is within normal limits  DVT prophylaxis: Eliquis Code Status: Full code Family Communication: No family at bedside Disposition Plan: Home tomorrow  Consultants:   Cardiology   Procedures:   Antimicrobials:    Subjective: -Okay, headache is better, has some muscle stiffness in her left calf, Dopplers were negative for DVT  Objective: Vitals:   02/20/19 0012 02/20/19 0428 02/20/19 0953 02/20/19 1103  BP: 103/75 123/63 (!) 111/58 105/66  Pulse: 75 67    Resp: 19 19 (!) 23 20  Temp: 98.3 F (36.8 C) 98.3 F (36.8 C)  98 F (36.7 C)  TempSrc: Oral Oral  Oral  SpO2: 93% 97%    Weight:      Height:        Intake/Output Summary (Last 24 hours) at 02/20/2019 1313 Last data filed at 02/19/2019 1700 Gross per 24 hour  Intake --  Output 200 ml  Net -200 ml   Filed Weights   02/16/19 2321 02/17/19 0306  Weight: 105.2 kg 108.7 kg    Examination: Gen: Awake, Alert, Oriented X 3, pleasant, no distress HEENT: PERRLA, Neck supple, no JVD Lungs: Good air movement bilaterally, CTAB CVS: S1-S2/regular rate rhythm Abd: soft, Non tender, non distended, BS present Extremities: No edema Skin: no new rashes Psychiatry: Judgement and insight appear normal. Mood & affect appropriate.     Data Reviewed:   CBC: Recent Labs  Lab 02/16/19 2326  WBC 9.7  HGB 11.9*  HCT 36.5  MCV 97.1  PLT 782   Basic Metabolic Panel: Recent Labs  Lab 02/16/19 2326 02/16/19 2341 02/17/19 1337 02/17/19 1933 02/18/19 0318 02/19/19 0240 02/20/19 0210  NA 139  --  141  --  138 138 137  K 3.6  --  3.4* 4.2 4.3 4.5 4.9  CL 102  --  104  --  103 100 102  CO2 23  --  26  --  28 28 27   GLUCOSE 244*  --   142*  --  149* 148* 145*  BUN 21*  --  20  --  17 22* 32*  CREATININE 0.94  --  1.02*  --  0.96 1.13* 1.18*  CALCIUM 9.1  --  8.7*  --  8.6* 8.9 8.9  MG  --  1.9 2.2  --  1.9 2.1 2.3   GFR: Estimated Creatinine Clearance: 65 mL/min (A) (by C-G formula based on SCr of 1.18 mg/dL (H)). Liver Function Tests: No results for input(s): AST, ALT, ALKPHOS, BILITOT, PROT, ALBUMIN in the last 168 hours. No results for input(s): LIPASE, AMYLASE in the last 168 hours. No results for input(s): AMMONIA in the last 168 hours. Coagulation Profile: No results for input(s): INR, PROTIME in the last 168 hours. Cardiac Enzymes: Recent Labs  Lab 02/16/19 2326 02/17/19 0214 02/17/19 0704 02/17/19 1337  TROPONINI <0.03 <0.03 <0.03 <0.03   BNP (last 3 results) No results for input(s): PROBNP in the last 8760 hours. HbA1C: No results for input(s): HGBA1C in the last 72 hours. CBG: Recent Labs  Lab 02/19/19 1122 02/19/19 1612 02/19/19 2143 02/20/19 0633 02/20/19 1100  GLUCAP 245* 139* 223* 140* 161*   Lipid Profile: No results for input(s): CHOL, HDL, LDLCALC, TRIG, CHOLHDL, LDLDIRECT in the last 72 hours. Thyroid Function Tests: No results for input(s): TSH, T4TOTAL, FREET4, T3FREE, THYROIDAB in the last 72 hours. Anemia Panel: No results for input(s): VITAMINB12, FOLATE, FERRITIN, TIBC, IRON, RETICCTPCT in the last 72 hours. Urine analysis:    Component Value Date/Time   COLORURINE YELLOW 09/01/2018 0804   APPEARANCEUR CLEAR 09/01/2018 0804   LABSPEC 1.017 09/01/2018 0804   PHURINE 5.0 09/01/2018 0804   GLUCOSEU NEGATIVE 09/01/2018 0804   GLUCOSEU NEGATIVE 08/14/2007 1248   HGBUR NEGATIVE 09/01/2018 0804   BILIRUBINUR NEGATIVE 09/01/2018 0804   BILIRUBINUR Negative 08/26/2018 1610   KETONESUR NEGATIVE 09/01/2018 0804   PROTEINUR NEGATIVE 09/01/2018 0804   UROBILINOGEN 1.0 08/26/2018 1610   UROBILINOGEN 0.2 11/04/2012 1622   NITRITE NEGATIVE 09/01/2018 0804   LEUKOCYTESUR NEGATIVE  09/01/2018 0804   Sepsis Labs: @LABRCNTIP (procalcitonin:4,lacticidven:4)  )No results found for this or any previous visit (from the past 240 hour(s)).       Radiology Studies: Vas [place] Lower Extremity Venous (dvt)  Result Date: 02/19/2019  Lower Venous Study Indications: Pain.  Performing Technologist: Toma Copier RVS  Examination Guidelines: A complete evaluation includes B-mode imaging, spectral Doppler, color Doppler, and power Doppler as needed of all accessible portions of each vessel. Bilateral testing is considered an integral part of a complete examination. Limited examinations for reoccurring indications may be performed as noted.  +-----+---------------+---------+-----------+----------+-------+  RIGHT Compressibility Phasicity Spontaneity Properties Summary  +-----+---------------+---------+-----------+----------+-------+  CFV   Full            Yes       Yes                             +-----+---------------+---------+-----------+----------+-------+  SFJ   Full                                                      +-----+---------------+---------+-----------+----------+-------+   +---------+---------------+---------+-----------+----------+-------+  LEFT      Compressibility Phasicity Spontaneity Properties Summary  +---------+---------------+---------+-----------+----------+-------+  CFV       Full            Yes       Yes                             +---------+---------------+---------+-----------+----------+-------+  SFJ       Full                                                      +---------+---------------+---------+-----------+----------+-------+  FV Prox   Full            Yes       Yes                             +---------+---------------+---------+-----------+----------+-------+  FV Mid    Full                                                      +---------+---------------+---------+-----------+----------+-------+  FV Distal Full            Yes       Yes                              +---------+---------------+---------+-----------+----------+-------+  PFV       Full            Yes       Yes                             +---------+---------------+---------+-----------+----------+-------+  POP       Full            Yes       Yes                             +---------+---------------+---------+-----------+----------+-------+  PTV       Full                                                      +---------+---------------+---------+-----------+----------+-------+  PERO      Full                                                      +---------+---------------+---------+-----------+----------+-------+     Summary: Right: There is no evidence of a common femoral vein obstruction. Left: There is no evidence of deep vein thrombosis in the lower extremity. No cystic structure found in the popliteal fossa.  *See table(s) above for measurements and observations.    Preliminary         Scheduled Meds:  apixaban  5 mg Oral BID   colchicine  0.6 mg Oral BID   diltiazem  240 mg Oral Daily   dofetilide  500 mcg Oral BID   furosemide  40 mg Oral Daily   insulin aspart  0-9 Units Subcutaneous TID WC   insulin aspart protamine- aspart  15 Units Subcutaneous Q supper   insulin aspart protamine- aspart  25 Units Subcutaneous Q breakfast   losartan  25 mg Oral Daily   metoprolol tartrate  25 mg Oral BID   pravastatin  20 mg Oral q1800   sodium chloride flush  3 mL Intravenous Q12H   spironolactone  25 mg Oral Daily   Continuous Infusions:  sodium chloride       LOS: 3 days    Time spent: 109min    Domenic Polite, MD [facility]  02/20/2019, 1:13 PM

## 2019-02-20 NOTE — Progress Notes (Signed)
Progress Note   Subjective   Doing well today, the patient denies CP or SOB.  No new concerns  Inpatient Medications    Scheduled Meds: . apixaban  5 mg Oral BID  . colchicine  0.6 mg Oral BID  . diltiazem  240 mg Oral Daily  . dofetilide  500 mcg Oral BID  . furosemide  40 mg Oral Daily  . insulin aspart  0-9 Units Subcutaneous TID WC  . insulin aspart protamine- aspart  15 Units Subcutaneous Q supper  . insulin aspart protamine- aspart  25 Units Subcutaneous Q breakfast  . losartan  25 mg Oral Daily  . metoprolol tartrate  25 mg Oral BID  . pravastatin  20 mg Oral q1800  . sodium chloride flush  3 mL Intravenous Q12H  . spironolactone  25 mg Oral Daily   Continuous Infusions: . sodium chloride     PRN Meds: sodium chloride, acetaminophen, hydrALAZINE, HYDROmorphone (DILAUDID) injection, methocarbamol, nitroGLYCERIN, sodium chloride flush   Vital Signs    Vitals:   02/19/19 2008 02/20/19 0012 02/20/19 0428 02/20/19 0953  BP: 116/71 103/75 123/63 (!) 111/58  Pulse: 65 75 67   Resp: (!) 21 19 19  (!) 23  Temp: 97.6 F (36.4 C) 98.3 F (36.8 C) 98.3 F (36.8 C)   TempSrc: Oral Oral Oral   SpO2: 95% 93% 97%   Weight:      Height:        Intake/Output Summary (Last 24 hours) at 02/20/2019 1016 Last data filed at 02/19/2019 1700 Gross per 24 hour  Intake 180 ml  Output 350 ml  Net -170 ml   Filed Weights   02/16/19 2321 02/17/19 0306  Weight: 105.2 kg 108.7 kg    Telemetry    Sinus rhythm - Personally Reviewed  Physical Exam   GEN- The patient is well appearing, alert and oriented x 3 today.   Head- normocephalic, atraumatic Eyes-  Sclera clear, conjunctiva pink Ears- hearing intact Oropharynx- clear Neck- supple, Lungs- Clear to ausculation bilaterally, normal work of breathing Heart- Regular rate and rhythm  GI- soft, NT, ND, + BS Extremities- no clubbing, cyanosis, or edema  MS- no significant deformity or atrophy Skin- no rash or lesion  Psych- euthymic mood, full affect Neuro- strength and sensation are intact   Labs    Chemistry Recent Labs  Lab 02/18/19 0318 02/19/19 0240 02/20/19 0210  NA 138 138 137  K 4.3 4.5 4.9  CL 103 100 102  CO2 28 28 27   GLUCOSE 149* 148* 145*  BUN 17 22* 32*  CREATININE 0.96 1.13* 1.18*  CALCIUM 8.6* 8.9 8.9  GFRNONAA >60 53* 50*  GFRAA >60 >60 58*  ANIONGAP 7 10 8      Hematology Recent Labs  Lab 02/16/19 2326  WBC 9.7  RBC 3.76*  HGB 11.9*  HCT 36.5  MCV 97.1  MCH 31.6  MCHC 32.6  RDW 11.9  PLT 329    Cardiac Enzymes Recent Labs  Lab 02/16/19 2326 02/17/19 0214 02/17/19 0704 02/17/19 1337  TROPONINI <0.03 <0.03 <0.03 <0.03   No results for input(s): TROPIPOC in the last 168 hours.      Patient Profile     63 y.o. female with a hx of CAD (DES to [facility] and mLAD 07/11/15>>patent stents on repeat cath 11/2016), chronic pericardial effusion, chronic pericarditis and pericardial rub, treated intermittently with steroids and colchicine, chronic CHF (diastolic), DM, HTN, remote NSCLC(tx surgically and chemo),COPD, pulmonary fibrosis(follows with Dr. Lala Lund with  mild AI, and AFibadmitted with palpitations/CP, found with AFib RVR  Assessment & Plan    1.  afib with RVR Now in sinus with tikosyn Qt is stable with current dosing, after 3 day hospitalization Continue tikosyn at current dosing at discharge with close follow-up in AF clinic Continue on eliquis  2. chest pain Atypical No further CV workup planned at this time.  3. Chronic pericardial effusion By echo 02/17/2019, no effusion  4. CAD No ischemic symptoms No ASA as she is on [facility] therapy  5. Chronic diastolic dysfunction Stable No change required today  6. HTN Stable No change required today  7. Mild to moderate AI By  Echo this admission  No further inpatient workup planned Follow-up in AF clinic to be arranged. Call with questions  Thompson Grayer MD, [facility] 02/20/2019 10:16 AM

## 2019-02-21 LAB — GLUCOSE, CAPILLARY: Glucose-Capillary: 94 mg/dL (ref 70–99)

## 2019-02-21 MED ORDER — DOFETILIDE 500 MCG PO CAPS: 500.0000 ug | ORAL_CAPSULE | Freq: Two times a day (BID) | ORAL | 0 refills | Status: DC

## 2019-02-21 NOTE — Progress Notes (Signed)
EKG done post tikosyn administration and patient discharge.

## 2019-02-21 NOTE — Discharge Summary (Signed)
Physician Discharge Summary  MARLISE FAHR HYW:[id number] DOB: 02-Sep-1956 DOA: 02/16/2019  PCP: Glendale Chard, MD  Admit date: 02/16/2019 Discharge date: 02/21/2019  Time spent: 35 minutes  Recommendations for Outpatient Follow-up:  1. Cardiology, A. fib clinic on 5/7 2. EP Dr. Curt Bears on 6/5 3. Newly started on Tikosyn for recurrent A. fib   Discharge Diagnoses:  Principal Problem:   Chest pain   Atrial fibrillation with rapid ventricular response   Controlled type 2 diabetes mellitus without complication, with long-term current use of insulin (HCC)   Hypertension, essential   CAD S/P percutaneous coronary angioplasty   Hyperlipidemia   Chronic diastolic CHF (congestive heart failure) (HCC)   Non-small cell lung cancer ([place])   Hypothyroidism   Discharge Condition: stable  Diet recommendation: Heart healthy, diabetic  Filed Weights   02/16/19 2321 02/17/19 0306  Weight: 105.2 kg 108.7 kg    History of present illness:  63 year old female with history of CAD, history of stenting to circumflex and LAD in 9/201 6, history of chronic pericardial effusion, paroxysmal atrial fibrillation on Eliquis, chronic diastolic CHF, diabetes, hypertension, remote history of non-small cell lung cancer treated surgically, pulmonary fibrosis, COPD, chronic anemia presented to the emergency room with atypical chest pain with some pressure-like and pleuritic discomfort in her chest, associated with atrial fibrillation with rapid ventricular response  Hospital Course:   Chest pain -Was admitted with atypical chest pain, likely due to atrial fibrillation with RVR, -history of CAD with mid circumflex and LAD stent in 06/2018 -Last cath 2/201 8 with patent stents, symptoms not consistent with ACS , troponin negative x3  -Continue beta-blocker statin, not on aspirin due to concurrent Eliquis -Appreciate cardiology input, 2D echocardiogram with preserved EF, no pericardial effusion -Resolved  Atrial  fibrillation with rapid ventricular response -Converted to sinus rhythm with IV Cardizem -Continued on oral Cardizem and metoprolol -Cardiology, EP consulted , started on Tikosyn 4/29pm,  -Felt to be a good candidate for amiodarone due to history of pulmonary fibrosis -Continue Eliquis -Completed Tikosyn load, QTc remained stable, cleared by EP for discharge home on Tikosyn 500 mg twice daily  L calf pain -Likely muscular, Dopplers negative for DVT  History of pericardial effusion -Intermittently treated in the past with colchicine -Repeat echo with no evidence of pericardial effusion  History of CAD -As above  Chronic diastolic CHF -appears euvolemic at this time, continue Lasix and Aldactone  Remote history of non-small cell lung cancer -Treated 20 years ago with surgery chemo and XRT  Type 2 diabetes mellitus -A1c is 7.0 -Continue insulin 75/25, lower dose from baseline, CBGs are stable    Hypothyroidism -Continue Synthroid, TSH is within normal limits   Consultations:  Cardiology, EP  Discharge Exam: Vitals:   02/21/19 0803 02/21/19 0916  BP: 104/72 100/70  Pulse:    Resp: (!) 25 20  Temp: 98.1 F (36.7 C)   SpO2:      General: Alert awake oriented x3 Cardiovascular: S1-S2/regular rate rhythm Respiratory: Clear bilaterally  Discharge Instructions   Discharge Instructions    Amb referral to AFIB Clinic   Complete by:  As directed    Diet - low sodium heart healthy   Complete by:  As directed    Increase activity slowly   Complete by:  As directed      Allergies as of 02/21/2019      Reactions   Hydrocodone Hives, Itching   Hydrocodone-acetaminophen Hives   Morphine And Related Other (See Comments)   GI  PROBLEMS   Rosuvastatin Other (See Comments)   Pt reports causes lower extremity muscle aches/cramping   Lisinopril Cough   Nsaids Other (See Comments)   GI PROBLEMS      Medication List    STOP taking these medications    methocarbamol 500 MG tablet Commonly known as:  Robaxin     TAKE these medications   B-D UF III MINI PEN NEEDLES 31G X 5 MM Misc Generic drug:  Insulin Pen Needle 2 (two) times daily. as directed   co-enzyme Q-10 50 MG capsule TAKE 1 CAPSULE (50 MG TOTAL) BY MOUTH DAILY. What changed:  See the new instructions.   diltiazem 240 MG 24 hr capsule Commonly known as:  CARDIZEM CD TAKE 1 CAPSULE BY MOUTH EVERY DAY What changed:  how much to take   dofetilide 500 MCG capsule Commonly known as:  Tikosyn Take 1 capsule (500 mcg total) by mouth 2 (two) times daily.   Eliquis 5 MG Tabs tablet Generic drug:  apixaban TAKE 1 TABLET BY MOUTH TWICE A DAY What changed:  how much to take   furosemide 40 MG tablet Commonly known as:  LASIX TAKE 1 TABLET BY MOUTH EVERY DAY   HumaLOG Mix 75/25 KwikPen (75-25) 100 UNIT/ML Kwikpen Generic drug:  Insulin Lispro Prot & Lispro Inject 30-40 Units into the skin See admin instructions. Inject 40 units SQ in Am and 30 units SQ in PM   levothyroxine 50 MCG tablet Commonly known as:  SYNTHROID Take 50 mcg by mouth daily before breakfast.   losartan 25 MG tablet Commonly known as:  COZAAR TAKE 1 TABLET (25 MG TOTAL) BY MOUTH DAILY.   metoprolol tartrate 25 MG tablet Commonly known as:  LOPRESSOR TAKE 1 TABLET (25 MG TOTAL) BY MOUTH 2 (TWO) TIMES DAILY.   nitroGLYCERIN 0.4 MG SL tablet Commonly known as:  NITROSTAT Place 1 tablet (0.4 mg total) under the tongue every 5 (five) minutes as needed for chest pain.   ONE TOUCH ULTRA TEST test strip Generic drug:  glucose blood USE AS DIRECTED 3 TIMES A DAY   Ozempic (0.25 or 0.5 MG/DOSE) 2 MG/1.5ML Sopn Generic drug:  Semaglutide(0.25 or 0.5MG /DOS) Inject 2 mg into the skin every Tuesday.   pravastatin 20 MG tablet Commonly known as:  PRAVACHOL TAKE 1 TABLET BY MOUTH EVERY DAY IN THE EVENING What changed:    how much to take  how to take this  when to take this   spironolactone 25 MG  tablet Commonly known as:  ALDACTONE Take 1 tablet (25 mg total) by mouth daily.   tiZANidine 4 MG tablet Commonly known as:  ZANAFLEX Take 4 mg by mouth every 8 (eight) hours as needed for muscle spasms.   Vitamin D (Ergocalciferol) 1.25 MG (50000 UT) Caps capsule Commonly known as:  DRISDOL TAKE 1 CAPSULE BY MOUTH ON TUESDAY AND FRIDAY What changed:  See the new instructions.      Allergies  Allergen Reactions  . Hydrocodone Hives and Itching  . Hydrocodone-Acetaminophen Hives  . Morphine And Related Other (See Comments)    GI PROBLEMS  . Rosuvastatin Other (See Comments)    Pt reports causes lower extremity muscle aches/cramping  . Lisinopril Cough  . Nsaids Other (See Comments)    GI PROBLEMS   Follow-up Information    [facility] Follow up.   Specialty:  Cardiology Why:  02/25/2019 @ 2:30PM, you are scheduled with C. Fenton, [place].  Please see discharge instructions for  parking instructions, Chief Technology Officer information: [address] [id number] [place] [address] [phone]       Constance Haw, MD Follow up.   Specialty:  Cardiology Why:  03/26/2019 @ 2:45PM.  This is currently scheduled as a video/virtual visit, please see discharge instructions.   Contact information: [address] [place] [address] [phone]            The results of significant diagnostics from this hospitalization (including imaging, microbiology, ancillary and laboratory) are listed below for reference.    Significant Diagnostic Studies: Dg Chest Port 1 View  Result Date: 02/17/2019 CLINICAL DATA:  Initial evaluation for acute chest pain. EXAM: PORTABLE CHEST 1 VIEW COMPARISON:  Prior radiograph from 12/29/2018 FINDINGS: Cardiomegaly with prominence of the right atrial contour, stable. Coronary stent in place. Mediastinal silhouette within normal limits. Elevation of the left hemidiaphragm with postsurgical  changes at the left lung apex, stable. Additional postsurgical changes present at the right upper lobe. No focal infiltrates. No edema or effusion. No visible pneumothorax. No acute osseous finding. IMPRESSION: 1. No active cardiopulmonary disease. 2. Chronic postsurgical changes involving the upper lobes bilaterally, left greater than right, stable. 3. Cardiomegaly, unchanged. Electronically Signed   By: Jeannine Boga M.D.   On: 02/17/2019 00:04   Vas [place] Lower Extremity Venous (dvt)  Result Date: 02/20/2019  Lower Venous Study Indications: Pain.  Performing Technologist: Toma Copier RVS  Examination Guidelines: A complete evaluation includes B-mode imaging, spectral Doppler, color Doppler, and power Doppler as needed of all accessible portions of each vessel. Bilateral testing is considered an integral part of a complete examination. Limited examinations for reoccurring indications may be performed as noted.  +-----+---------------+---------+-----------+----------+-------+ RIGHTCompressibilityPhasicitySpontaneityPropertiesSummary +-----+---------------+---------+-----------+----------+-------+ CFV  Full           Yes      Yes                          +-----+---------------+---------+-----------+----------+-------+ SFJ  Full                                                 +-----+---------------+---------+-----------+----------+-------+   +---------+---------------+---------+-----------+----------+-------+ LEFT     CompressibilityPhasicitySpontaneityPropertiesSummary +---------+---------------+---------+-----------+----------+-------+ CFV      Full           Yes      Yes                          +---------+---------------+---------+-----------+----------+-------+ SFJ      Full                                                 +---------+---------------+---------+-----------+----------+-------+ FV Prox  Full           Yes      Yes                           +---------+---------------+---------+-----------+----------+-------+ FV Mid   Full                                                 +---------+---------------+---------+-----------+----------+-------+  FV DistalFull           Yes      Yes                          +---------+---------------+---------+-----------+----------+-------+ PFV      Full           Yes      Yes                          +---------+---------------+---------+-----------+----------+-------+ POP      Full           Yes      Yes                          +---------+---------------+---------+-----------+----------+-------+ PTV      Full                                                 +---------+---------------+---------+-----------+----------+-------+ PERO     Full                                                 +---------+---------------+---------+-----------+----------+-------+     Summary: Right: There is no evidence of a common femoral vein obstruction. Left: There is no evidence of deep vein thrombosis in the lower extremity. No cystic structure found in the popliteal fossa.  *See table(s) above for measurements and observations. Electronically signed by Curt Jews MD on 02/20/2019 at 1:52:09 PM.    Final     Microbiology: No results found for this or any previous visit (from the past 240 hour(s)).   Labs: Basic Metabolic Panel: Recent Labs  Lab 02/16/19 2326 02/16/19 2341 02/17/19 1337 02/17/19 1933 02/18/19 0318 02/19/19 0240 02/20/19 0210  NA 139  --  141  --  138 138 137  K 3.6  --  3.4* 4.2 4.3 4.5 4.9  CL 102  --  104  --  103 100 102  CO2 23  --  26  --  28 28 27   GLUCOSE 244*  --  142*  --  149* 148* 145*  BUN 21*  --  20  --  17 22* 32*  CREATININE 0.94  --  1.02*  --  0.96 1.13* 1.18*  CALCIUM 9.1  --  8.7*  --  8.6* 8.9 8.9  MG  --  1.9 2.2  --  1.9 2.1 2.3   Liver Function Tests: No results for input(s): AST, ALT, ALKPHOS, BILITOT, PROT, ALBUMIN in the last 168  hours. No results for input(s): LIPASE, AMYLASE in the last 168 hours. No results for input(s): AMMONIA in the last 168 hours. CBC: Recent Labs  Lab 02/16/19 2326  WBC 9.7  HGB 11.9*  HCT 36.5  MCV 97.1  PLT 329   Cardiac Enzymes: Recent Labs  Lab 02/16/19 2326 02/17/19 0214 02/17/19 0704 02/17/19 1337  TROPONINI <0.03 <0.03 <0.03 <0.03   BNP: BNP (last 3 results) Recent Labs    02/17/19 0214  BNP 46.5    ProBNP (last 3 results) No results for input(s): PROBNP in the last 8760 hours.  CBG: Recent Labs  Lab  02/20/19 0633 02/20/19 1100 02/20/19 1548 02/20/19 2138 02/21/19 0629  GLUCAP 140* 161* 160* 121* 94       Signed:  Domenic Polite MD.  [facility] 02/21/2019, 2:53 PM

## 2019-02-21 NOTE — Progress Notes (Signed)
Progress Note   Subjective   Doing well today, the patient denies CP or SOB.  No new concerns  Inpatient Medications    Scheduled Meds: . apixaban  5 mg Oral BID  . colchicine  0.6 mg Oral BID  . diltiazem  240 mg Oral Daily  . dofetilide  500 mcg Oral BID  . furosemide  40 mg Oral Daily  . insulin aspart  0-9 Units Subcutaneous TID WC  . insulin aspart protamine- aspart  15 Units Subcutaneous Q supper  . insulin aspart protamine- aspart  25 Units Subcutaneous Q breakfast  . losartan  25 mg Oral Daily  . metoprolol tartrate  25 mg Oral BID  . pravastatin  20 mg Oral q1800  . sodium chloride flush  3 mL Intravenous Q12H  . spironolactone  25 mg Oral Daily   Continuous Infusions: . sodium chloride     PRN Meds: sodium chloride, acetaminophen, hydrALAZINE, HYDROmorphone (DILAUDID) injection, methocarbamol, nitroGLYCERIN, sodium chloride flush   Vital Signs    Vitals:   02/21/19 0004 02/21/19 0355 02/21/19 0803 02/21/19 0916  BP: 95/61 (!) 106/55 104/72 100/70  Pulse: 79 73    Resp: (!) 23 (!) 21 (!) 25 20  Temp: 98.4 F (36.9 C) 98.2 F (36.8 C) 98.1 F (36.7 C)   TempSrc: Oral Oral Oral   SpO2: 98% 97%    Weight:      Height:        Intake/Output Summary (Last 24 hours) at 02/21/2019 1130 Last data filed at 02/20/2019 2300 Gross per 24 hour  Intake 480 ml  Output -  Net 480 ml   Filed Weights   02/16/19 2321 02/17/19 0306  Weight: 105.2 kg 108.7 kg    Telemetry    Sinus, non arrrhythmias,  Qtc 470s - Personally Reviewed  Physical Exam  GEN- The patient is overweight appearing, alert and oriented x 3 today.   Head- normocephalic, atraumatic Eyes-  Sclera clear, conjunctiva pink Ears- hearing intact Oropharynx- clear Neck- supple, Lungs- normal work of breathing Heart- Regular rate and rhythm  GI- soft, NT, ND, + BS Extremities- no clubbing, cyanosis, or edema  MS- no significant deformity or atrophy Skin- no rash or lesion Psych- euthymic  mood, full affect Neuro- strength and sensation are intact  Labs    Chemistry Recent Labs  Lab 02/18/19 0318 02/19/19 0240 02/20/19 0210  NA 138 138 137  K 4.3 4.5 4.9  CL 103 100 102  CO2 28 28 27   GLUCOSE 149* 148* 145*  BUN 17 22* 32*  CREATININE 0.96 1.13* 1.18*  CALCIUM 8.6* 8.9 8.9  GFRNONAA >60 53* 50*  GFRAA >60 >60 58*  ANIONGAP 7 10 8      Hematology Recent Labs  Lab 02/16/19 2326  WBC 9.7  RBC 3.76*  HGB 11.9*  HCT 36.5  MCV 97.1  MCH 31.6  MCHC 32.6  RDW 11.9  PLT 329   Cardiac Enzymes Recent Labs  Lab 02/16/19 2326 02/17/19 0214 02/17/19 0704 02/17/19 1337  TROPONINI <0.03 <0.03 <0.03 <0.03   No results for input(s): TROPIPOC in the last 168 hours.   Patient Profile   63 y.o.femalewith a hx of CAD (DES to [facility] and mLAD 07/11/15>>patent stents on repeat cath 11/2016), chronic pericardial effusion, chronic pericarditis and pericardial rub, treated intermittently with steroids and colchicine, chronic CHF (diastolic), DM, HTN, remote NSCLC(tx surgically and chemo),COPD, pulmonary fibrosis(follows with Dr. Lala Lund with mild AI, and AFibadmitted with palpitations/CP, found with AFib RVR.  Assessment & Plan    1.  afib Maintaining sinus with tikosyn Qt is stable Ok to discharge with close follow-up in the AF clinic  2. CAD No ischemic symptoms  3. Chronic diastolic dysfunction Stable No change required today  4. HTN Stable No change required today  We will arrange follow-up in the AF clinic next week for labs and ekg  Thompson Grayer MD, [facility] 02/21/2019 11:30 AM

## 2019-02-21 NOTE — Care Management (Signed)
Spoke with Dr. Broadus John regarding [name] prescriptions.  She will print two prescriptions- one for 7 days, one for 30 days.  The 7 day prescription will be filled in main pharmacy.

## 2019-02-21 NOTE — Progress Notes (Signed)
IV and telemetry discontinued at this time. CCMD notified. Discharge instructions reviewed with patient. All questions answered.

## 2019-02-24 ENCOUNTER — Other Ambulatory Visit: Payer: Self-pay | Admitting: Cardiology

## 2019-02-25 ENCOUNTER — Other Ambulatory Visit: Payer: Self-pay

## 2019-02-25 ENCOUNTER — Ambulatory Visit (HOSPITAL_COMMUNITY)
Admit: 2019-02-25 | Discharge: 2019-02-25 | Disposition: A | Discharge status: Home / Self Care | Payer: Medicare Other | Source: Ambulatory Visit | Attending: Physician Assistant | Admitting: Physician Assistant

## 2019-02-25 ENCOUNTER — Encounter (HOSPITAL_COMMUNITY): Payer: Self-pay | Admitting: *Deleted

## 2019-02-25 ENCOUNTER — Encounter (HOSPITAL_COMMUNITY): Payer: Self-pay | Admitting: Physician Assistant

## 2019-02-25 VITALS — BP 106/64 | HR 77 | Ht 67.5 in | Wt 237.0 lb

## 2019-02-25 DIAGNOSIS — I480 Paroxysmal atrial fibrillation: Secondary | ICD-10-CM | POA: Diagnosis not present

## 2019-02-25 DIAGNOSIS — I4891 Unspecified atrial fibrillation: Secondary | ICD-10-CM | POA: Diagnosis not present

## 2019-02-25 LAB — BASIC METABOLIC PANEL
Anion gap: 8 (ref 5–15)
BUN: 18 mg/dL (ref 6–20)
CO2: 26 mmol/L (ref 22–32)
Calcium: 9.4 mg/dL (ref 8.9–10.3)
Chloride: 106 mmol/L (ref 98–111)
Creatinine, Ser: 0.96 mg/dL (ref 0.44–1.00)
GFR calc Af Amer: >60 mL/min (ref >60)
GFR calc non Af Amer: >60 mL/min (ref >60)
Glucose, Bld: 64 mg/dL — ABNORMAL LOW (ref 70–99)
Potassium: 4.7 mmol/L (ref 3.5–5.1)
Sodium: 140 mmol/L (ref 135–145)

## 2019-02-25 LAB — MAGNESIUM: Magnesium: 2.2 mg/dL (ref 1.7–2.4)

## 2019-02-26 NOTE — Progress Notes (Signed)
Primary Care Physician: Glendale Chard, MD Primary Cardiologist: Dr [name] Primary Electrophysiologist: Dr Curt Bears Referring Physician: Dr Lona Kettle [name] is a 63 y.o. female with a history of paroxysmal atrial fibrillation, CAD s/p DES to [facility] and mLAD 07/11/15 (patent stents on repeat cath 11/2016),chronic pericardial effusion, h/ochronicpericarditiswith chronic pericardial rub treated intermittently with steroids and colchicine,chronic diastolic CHF,DM, HTN,remote h/o treatednon-small cell lung cancer, pulmonary fibrosis, aortic insufficiency, COPD and chronic anemia who presents for follow up in the [facility]. Patient was recently admitted for symptomatic atrial fibrillation with RVR. She was started on Tikosyn during her admission. She denies snoring or significant alcohol use.  Since being discharged, patient has done very well with no symptoms of afib. She is tolerating dofetilide without difficulty.   Today, she denies symptoms of palpitations, chest pain, shortness of breath, orthopnea, PND, lower extremity edema, dizziness, presyncope, syncope, snoring, daytime somnolence, bleeding, or neurologic sequela. The patient is tolerating medications without difficulties and is otherwise without complaint today.    Atrial Fibrillation Risk Factors:  she does not have symptoms or diagnosis of sleep apnea. she does not have a history of rheumatic fever. she does not have a history of alcohol use. The patient does not have a history of early familial atrial fibrillation or other arrhythmias.  she has a BMI of Body mass index is 36.57 kg/m.[name] Filed Weights   02/25/19 1435  Weight: 107.5 kg    Family History  Problem Relation Age of Onset  . Heart disease Father   . Diabetes Father   . Pancreatic cancer Mother   . Diabetes Mother   . Hypertension Sister        x 3  . Diabetes Sister        x 4  . Diabetes Maternal Grandmother   .  Diabetes Paternal Grandmother   . Diabetes Sister   . Diabetes Sister   . Diabetes Sister   . Kidney disease Sister   . Colon cancer Neg Hx   . Colon polyps Neg Hx   . Rectal cancer Neg Hx   . Stomach cancer Neg Hx   . Esophageal cancer Neg Hx   . Breast cancer Neg Hx      Atrial Fibrillation Management history:  Previous antiarrhythmic drugs: dofetilide Previous cardioversions: none Previous ablations: none CHADS2VASC score: 4 (female, CAD, HTN, DM) Anticoagulation history: Eliquis   Past Medical History:  Diagnosis Date  . Allergy   . Anemia   . Anemia   . Aortic insufficiency    a. Mod by echo 05/2015.  . Arthritis    "knees" (07/11/2015)  . Atrial fibrillation (HCC)    On Eliquis  . 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 Complication of anesthesia    "I had a hard time waking me up w/one of my shoulder surgeries"  . Coronary artery disease    a. Abnormal stress test -> LHc 06/2015 s/p DES to mCX and mLAD, residual D2 disease treated medially.  . Diverticulitis   . Elevated blood pressure   . Erythema nodosum   . Heart murmur   . Insomnia   . lung ca dx'd 10/1999   chemo/xrt comp 05/29/2000  . Myalgia   . Nephrolithiasis 2015  . Obesity   . Pericardial effusion    a. 05/2015 Echo: EF 55-60%, no rwma, Gr 1  DD, no effusion - but an effusion was seen on CT which was felt to be increased in size compared to 12/2014 - pericardium also thickened.   . Pericarditis    a. Dx 05/2015.  [name] Pneumothorax, spontaneous, tension 01/20/2012   October, 2010  . Pulmonary fibrosis ([place]) 01/20/2012   Due to previous surgery and chest radiation for lung cancer  . Restrictive lung disease   . Type II diabetes mellitus ([place])    Past Surgical History:  Procedure Laterality Date  . ABDOMINAL HYSTERECTOMY  2008  . APPENDECTOMY  1969?  [name] CARDIAC CATHETERIZATION N/A  07/11/2015   Procedure: Left Heart Cath and Coronary Angiography;  Surgeon: Jettie Booze, MD;  Location: [facility];  Service: Cardiovascular;  Laterality: N/A;  . CARDIAC CATHETERIZATION  07/11/2015   Procedure: Coronary Stent Intervention;  Surgeon: Jettie Booze, MD;  Location: [place] [facility];  Service: Cardiovascular;;  . CARPAL TUNNEL RELEASE Bilateral 1998-2005?   right-left  . CARPAL TUNNEL RELEASE Left 2009?   "for the 2nd time"  . COLONOSCOPY    . COLONOSCOPY WITH PROPOFOL N/A 05/28/2018   Procedure: COLONOSCOPY WITH PROPOFOL;  Surgeon: Milus Banister, MD;  Location: [facility];  Service: Endoscopy;  Laterality: N/A;  . CORONARY ANGIOPLASTY    . CYSTOSCOPY W/ STONE MANIPULATION  2013  . ESOPHAGOGASTRODUODENOSCOPY (EGD) WITH ESOPHAGEAL DILATION  2012  . FOOT SURGERY Bilateral 1980's   Callous removed   . INNER EAR SURGERY Right ~ 2009  . KNEE ARTHROPLASTY Right 1991  . KNEE ARTHROSCOPY  ~ 2000   LATERAL RELEASE  . LEFT HEART CATH AND CORONARY ANGIOGRAPHY N/A 12/18/2016   Procedure: Left Heart Cath and Coronary Angiography;  Surgeon: Burnell Blanks, MD;  Location: [facility];  Service: Cardiovascular;  Laterality: N/A;  . LUNG CANCER SURGERY Left 2001   "small cell"  . LUNG SURGERY Right 2007   "reinflated it"  . PULMONARY EMBOLISM SURGERY  2007   LUNG COLLAPSE  . RIGHT AND LEFT HEART CATH N/A 06/25/2017   Procedure: RIGHT AND LEFT HEART CATH;  Surgeon: Sherren Mocha, MD;  Location: [facility];  Service: Cardiovascular;  Laterality: N/A;  . SHOULDER ADHESION RELEASE Right ~ 2004  . SHOULDER ARTHROSCOPY W/ ROTATOR CUFF REPAIR Right ~ 2003    Current Outpatient Medications  Medication Sig Dispense Refill  . B-D UF III MINI PEN NEEDLES 31G X 5 MM MISC 2 (two) times daily. as directed    . co-enzyme Q-10 50 MG capsule TAKE 1 CAPSULE (50 MG TOTAL) BY MOUTH DAILY. 90 capsule 1  . diltiazem (CARDIZEM CD) 240 MG 24 hr capsule TAKE 1  CAPSULE BY MOUTH EVERY DAY 90 capsule 0  . dofetilide (TIKOSYN) 500 MCG capsule Take 1 capsule (500 mcg total) by mouth 2 (two) times daily. 30 capsule 0  . ELIQUIS 5 MG TABS tablet TAKE 1 TABLET BY MOUTH TWICE A DAY 180 tablet 2  . furosemide (LASIX) 40 MG tablet TAKE 1 TABLET BY MOUTH EVERY DAY 90 tablet 3  . HUMALOG MIX 75/25 KWIKPEN (75-25) 100 UNIT/ML Kwikpen Inject 30-40 Units into the skin See admin instructions. Inject 40 units SQ in Am and 30 units SQ in PM  6  . levothyroxine (SYNTHROID, LEVOTHROID) 50 MCG tablet Take 50 mcg by mouth daily before breakfast.   6  . losartan (COZAAR) 25 MG tablet TAKE 1 TABLET (25 MG TOTAL) BY MOUTH DAILY. 90 tablet 2  . metoprolol  tartrate (LOPRESSOR) 25 MG tablet TAKE 1 TABLET (25 MG TOTAL) BY MOUTH 2 (TWO) TIMES DAILY. 180 tablet 2  . ONE TOUCH ULTRA TEST test strip USE AS DIRECTED 3 TIMES A DAY    . pravastatin (PRAVACHOL) 20 MG tablet TAKE 1 TABLET BY MOUTH EVERY DAY IN THE EVENING (Patient taking differently: Take 20 mg by mouth daily. ) 90 tablet 3  . Semaglutide,0.25 or 0.5MG /DOS, (OZEMPIC, 0.25 OR 0.5 MG/DOSE,) 2 MG/1.5ML SOPN Inject 2 mg into the skin every Tuesday.     [name] spironolactone (ALDACTONE) 25 MG tablet Take 1 tablet (25 mg total) by mouth daily. 90 tablet 0  . tiZANidine (ZANAFLEX) 4 MG tablet Take 4 mg by mouth every 8 (eight) hours as needed for muscle spasms.    . nitroGLYCERIN (NITROSTAT) 0.4 MG SL tablet Place 1 tablet (0.4 mg total) under the tongue every 5 (five) minutes as needed for chest pain. (Patient not taking: Reported on 02/25/2019) 25 tablet 3   No current facility-administered medications for this encounter.     Allergies  Allergen Reactions  . Hydrocodone Hives and Itching  . Hydrocodone-Acetaminophen Hives  . Morphine And Related Other (See Comments)    GI PROBLEMS  . Rosuvastatin Other (See Comments)    Pt reports causes lower extremity muscle aches/cramping  . Lisinopril Cough  . Nsaids Other (See Comments)     GI PROBLEMS    Social History   Socioeconomic History  . Marital status: Single    Spouse name: Not on file  . Number of children: 0  . Years of education: Not on file  . Highest education level: Not on file  Occupational History  . Occupation: RETIRED  Social Needs  . Financial resource strain: Not hard at all  . Food insecurity:    Worry: Never true    Inability: Never true  . Transportation needs:    Medical: No    Non-medical: No  Tobacco Use  . Smoking status: Former Smoker    Packs/day: 0.25    Years: 5.00    Pack years: 1.25    Types: Cigarettes    Last attempt to quit: 07/22/1999    Years since quitting: 19.6  . Smokeless tobacco: Never Used  Substance and Sexual Activity  . Alcohol use: No    Alcohol/week: 0.0 standard drinks  . Drug use: No  . Sexual activity: Not Currently    Birth control/protection: Other-see comments    Comment: hysterectomy  Lifestyle  . Physical activity:    Days per week: 0 days    Minutes per session: 0 min  . Stress: Not at all  Relationships  . Social connections:    Talks on phone: Not on file    Gets together: Not on file    Attends religious service: Not on file    Active member of club or organization: Not on file    Attends meetings of clubs or organizations: Not on file    Relationship status: Not on file  . Intimate partner violence:    Fear of current or ex partner: No    Emotionally abused: No    Physically abused: No    Forced sexual activity: No  Other Topics Concern  . Not on file  Social History Narrative  . Not on file     ROS- All systems are reviewed and negative except as per the HPI above.  Physical Exam: Vitals:   02/25/19 1435  BP: 106/64  Pulse: 77  Weight: 107.5 kg  Height: 5' 7.5" (1.715 m)    GEN- The patient is well appearing obese female, alert and oriented x 3 today.   Head- normocephalic, atraumatic Eyes-  Sclera clear, conjunctiva pink Ears- hearing intact Oropharynx- clear  Neck- supple  Lungs- Clear to ausculation bilaterally, normal work of breathing Heart- Regular rate and rhythm, no murmurs, rubs or gallops  GI- soft, NT, ND, + BS Extremities- no clubbing, cyanosis, or edema MS- no significant deformity or atrophy Skin- no rash or lesion Psych- euthymic mood, full affect Neuro- strength and sensation are intact  Wt Readings from Last 3 Encounters:  02/25/19 107.5 kg  02/17/19 108.7 kg  01/01/19 107.1 kg    EKG today demonstrates SR HR 77, PR 142, QRS 86, QTc 475  Echo 01/28/19 demonstrated   1. The left ventricle has hyperdynamic systolic function, with an ejection fraction of >65%. The cavity size was normal. There is moderately increased left ventricular wall thickness. Left ventricular diastolic Doppler parameters are consistent with  pseudonormalization. Elevated left atrial and left ventricular end-diastolic pressures The E/e' is >15. No evidence of left ventricular regional wall motion abnormalities.  2. The right ventricle has normal systolic function. The cavity was normal. There is no increase in right ventricular wall thickness.  3. The mitral valve is grossly normal.  4. The tricuspid valve is grossly normal.  5. The aortic valve is tricuspid. Mild sclerosis of the aortic valve. Aortic valve regurgitation is mild to moderate by color flow Doppler. mildly increased gradient without stenosis of the aortic valve.  6. The aortic root and ascending aorta are normal in size and structure.  7. The inferior vena cava was dilated in size with <50% respiratory variability.  Epic records are reviewed at length today  Assessment and Plan:  1. Paroxysmal atrial fibrillation The patient has paroxysmal atrial fibrillation. Doing well s/p dofetilide loading. QT stable. Continue dofetilide 500 mg BID Continue diltiazem 240 mg daily and Lopressor 25 mg BID. Continue Eliquis 5 mg BID Check Bmet/mag  This patients CHA2DS2-VASc Score and unadjusted  Ischemic Stroke Rate (% per year) is equal to 4.8 % stroke rate/year from a score of 4  Above score calculated as 1 point each if present [CHF, HTN, DM, Vascular=MI/PAD/Aortic Plaque, Age if 65-74, or Female] Above score calculated as 2 points each if present [Age > 75, or Stroke/TIA/TE]   2. Obesity Body mass index is 36.57 kg/m. Lifestyle modification was discussed at length including regular exercise and weight reduction.  3. CAD No anginal symptoms. Continue present therapy and risk factor modification.  4. HTN Stable, no changes today.   Follow up with Dr Curt Bears as scheduled.  [facility] [address] [place], [place] [address] [phone] 02/26/2019 11:02 AM

## 2019-03-01 ENCOUNTER — Other Ambulatory Visit: Payer: Self-pay | Admitting: Cardiology

## 2019-03-08 ENCOUNTER — Ambulatory Visit (INDEPENDENT_AMBULATORY_CARE_PROVIDER_SITE_OTHER): Payer: Medicare Other | Admitting: Internal Medicine

## 2019-03-08 ENCOUNTER — Other Ambulatory Visit: Payer: Self-pay

## 2019-03-08 ENCOUNTER — Encounter: Payer: Self-pay | Admitting: Internal Medicine

## 2019-03-08 VITALS — Ht 67.5 in | Wt 241.0 lb

## 2019-03-08 DIAGNOSIS — I1310 Hypertensive heart and chronic kidney disease without heart failure, with stage 1 through stage 4 chronic kidney disease, or unspecified chronic kidney disease: Secondary | ICD-10-CM

## 2019-03-08 DIAGNOSIS — Z6837 Body mass index (BMI) 37.0-37.9, adult: Secondary | ICD-10-CM

## 2019-03-08 DIAGNOSIS — N183 Chronic kidney disease, stage 3 unspecified: Secondary | ICD-10-CM

## 2019-03-08 DIAGNOSIS — Z794 Long term (current) use of insulin: Secondary | ICD-10-CM

## 2019-03-08 DIAGNOSIS — E1122 Type 2 diabetes mellitus with diabetic chronic kidney disease: Secondary | ICD-10-CM | POA: Diagnosis not present

## 2019-03-08 DIAGNOSIS — I482 Chronic atrial fibrillation, unspecified: Secondary | ICD-10-CM | POA: Diagnosis not present

## 2019-03-08 DIAGNOSIS — E66812 Obesity, class 2: Secondary | ICD-10-CM

## 2019-03-08 DIAGNOSIS — E039 Hypothyroidism, unspecified: Secondary | ICD-10-CM

## 2019-03-08 DIAGNOSIS — Z113 Encounter for screening for infections with a predominantly sexual mode of transmission: Secondary | ICD-10-CM

## 2019-03-08 MED ORDER — SEMAGLUTIDE(0.25 OR 0.5MG/DOS) 2 MG/1.5ML ~~LOC~~ SOPN: 0.5000 mg | PEN_INJECTOR | SUBCUTANEOUS | 1 refills | Status: DC

## 2019-03-08 NOTE — Patient Instructions (Signed)

## 2019-03-09 ENCOUNTER — Other Ambulatory Visit: Payer: Self-pay

## 2019-03-09 ENCOUNTER — Other Ambulatory Visit: Payer: Medicare Other

## 2019-03-09 DIAGNOSIS — Z113 Encounter for screening for infections with a predominantly sexual mode of transmission: Secondary | ICD-10-CM

## 2019-03-09 DIAGNOSIS — E039 Hypothyroidism, unspecified: Secondary | ICD-10-CM

## 2019-03-09 DIAGNOSIS — E1122 Type 2 diabetes mellitus with diabetic chronic kidney disease: Secondary | ICD-10-CM

## 2019-03-09 DIAGNOSIS — Z794 Long term (current) use of insulin: Secondary | ICD-10-CM

## 2019-03-10 ENCOUNTER — Encounter (HOSPITAL_COMMUNITY): Payer: Medicare Other

## 2019-03-10 LAB — LIPID PANEL
Chol/HDL Ratio: 2.7 ratio (ref 0.0–4.4)
Cholesterol, Total: 131 mg/dL (ref 100–199)
HDL: 48 mg/dL (ref >39)
LDL Calculated: 68 mg/dL (ref 0–99)
Triglycerides: 77 mg/dL (ref 0–149)
VLDL Cholesterol Cal: 15 mg/dL (ref 5–40)

## 2019-03-10 LAB — HIV ANTIBODY (ROUTINE TESTING W REFLEX): HIV Screen 4th Generation wRfx: NONREACTIVE

## 2019-03-10 LAB — MAGNESIUM: Magnesium: 2.1 mg/dL (ref 1.6–2.3)

## 2019-03-10 LAB — T4, FREE: Free T4: 1.06 ng/dL (ref 0.82–1.77)

## 2019-03-10 LAB — TSH: TSH: 3.34 u[IU]/mL (ref 0.450–4.500)

## 2019-03-12 ENCOUNTER — Other Ambulatory Visit: Payer: Self-pay

## 2019-03-12 ENCOUNTER — Ambulatory Visit (HOSPITAL_COMMUNITY)
Admission: RE | Admit: 2019-03-12 | Discharge: 2019-03-12 | Disposition: A | Discharge status: Home / Self Care | Payer: Medicare Other | Source: Ambulatory Visit | Attending: Cardiology | Admitting: Cardiology

## 2019-03-12 DIAGNOSIS — M2042 Other hammer toe(s) (acquired), left foot: Secondary | ICD-10-CM | POA: Diagnosis not present

## 2019-03-12 DIAGNOSIS — L84 Corns and callosities: Secondary | ICD-10-CM

## 2019-03-12 DIAGNOSIS — I739 Peripheral vascular disease, unspecified: Secondary | ICD-10-CM | POA: Diagnosis not present

## 2019-03-12 NOTE — Progress Notes (Signed)
Error pend

## 2019-03-13 ENCOUNTER — Emergency Department (HOSPITAL_COMMUNITY): Payer: Medicare Other

## 2019-03-13 ENCOUNTER — Emergency Department (HOSPITAL_COMMUNITY)
Admission: EM | Admit: 2019-03-13 | Discharge: 2019-03-13 | Disposition: A | Discharge status: Home / Self Care | Payer: Medicare Other | Attending: Emergency Medicine | Admitting: Emergency Medicine

## 2019-03-13 ENCOUNTER — Other Ambulatory Visit: Payer: Self-pay

## 2019-03-13 DIAGNOSIS — R079 Chest pain, unspecified: Secondary | ICD-10-CM

## 2019-03-13 DIAGNOSIS — Z79899 Other long term (current) drug therapy: Secondary | ICD-10-CM | POA: Insufficient documentation

## 2019-03-13 DIAGNOSIS — I4891 Unspecified atrial fibrillation: Secondary | ICD-10-CM

## 2019-03-13 DIAGNOSIS — Z794 Long term (current) use of insulin: Secondary | ICD-10-CM | POA: Diagnosis not present

## 2019-03-13 DIAGNOSIS — E119 Type 2 diabetes mellitus without complications: Secondary | ICD-10-CM | POA: Diagnosis not present

## 2019-03-13 DIAGNOSIS — R0789 Other chest pain: Secondary | ICD-10-CM | POA: Insufficient documentation

## 2019-03-13 DIAGNOSIS — I2510 Atherosclerotic heart disease of native coronary artery without angina pectoris: Secondary | ICD-10-CM | POA: Insufficient documentation

## 2019-03-13 DIAGNOSIS — Z87891 Personal history of nicotine dependence: Secondary | ICD-10-CM | POA: Diagnosis not present

## 2019-03-13 DIAGNOSIS — Z7901 Long term (current) use of anticoagulants: Secondary | ICD-10-CM | POA: Diagnosis not present

## 2019-03-13 LAB — CBC
HCT: 33.2 % — ABNORMAL LOW (ref 36.0–46.0)
Hemoglobin: 10.8 g/dL — ABNORMAL LOW (ref 12.0–15.0)
MCH: 31.6 pg (ref 26.0–34.0)
MCHC: 32.5 g/dL (ref 30.0–36.0)
MCV: 97.1 fL (ref 80.0–100.0)
Platelets: 277 10*3/uL (ref 150–400)
RBC: 3.42 MIL/uL — ABNORMAL LOW (ref 3.87–5.11)
RDW: 12 % (ref 11.5–15.5)
WBC: 8.5 10*3/uL (ref 4.0–10.5)
nRBC: 0 % (ref 0.0–0.2)

## 2019-03-13 LAB — TROPONIN I
Troponin I: <0.03 ng/mL (ref <0.03)
Troponin I: <0.03 ng/mL (ref <0.03)

## 2019-03-13 LAB — I-STAT BETA HCG BLOOD, ED (MC, WL, AP ONLY): I-stat hCG, quantitative: <5 m[IU]/mL (ref <5)

## 2019-03-13 LAB — BASIC METABOLIC PANEL
Anion gap: 11 (ref 5–15)
BUN: 20 mg/dL (ref 6–20)
CO2: 25 mmol/L (ref 22–32)
Calcium: 9 mg/dL (ref 8.9–10.3)
Chloride: 105 mmol/L (ref 98–111)
Creatinine, Ser: 0.96 mg/dL (ref 0.44–1.00)
GFR calc Af Amer: >60 mL/min (ref >60)
GFR calc non Af Amer: >60 mL/min (ref >60)
Glucose, Bld: 73 mg/dL (ref 70–99)
Potassium: 3.4 mmol/L — ABNORMAL LOW (ref 3.5–5.1)
Sodium: 141 mmol/L (ref 135–145)

## 2019-03-13 MED ORDER — SODIUM CHLORIDE 0.9% FLUSH: 3.0000 mL | Freq: Once | INTRAVENOUS | Status: DC

## 2019-03-13 NOTE — ED Provider Notes (Signed)
Emergency Department Provider Note   I have reviewed the triage vital signs and the nursing notes.   HISTORY  Chief Complaint Chest Pain   HPI [name] is a 63 y.o. female who was recently admitted to hospital for Afib RVR who presents to ED after an episode of cp followed by palpitations and HR up to 120's. Improved with slow breathing. Pain improved as well. No associated sob, diaphoresis, dizziness or vomiting. Asymptomatic at this time. On tikosyn. No missed doses.    No other associated or modifying symptoms.    Past Medical History:  Diagnosis Date   Allergy    Anemia    Anemia    Aortic insufficiency    a. Mod by echo 05/2015.   Arthritis    "knees" (07/11/2015)   Atrial fibrillation (HCC)    On Eliquis   Carcinoma of hilus of lung ([place]) 01/20/2012   IIIB NSCL  Right hilar mass compressing esophagus/presenting with dysphagia Rx Surgery/RT/chemo dx January 6213   Complication of anesthesia    "I had a hard time waking me up w/one of my shoulder surgeries"   Coronary artery disease    a. Abnormal stress test -> LHc 06/2015 s/p DES to mCX and mLAD, residual D2 disease treated medially.   Diverticulitis    Elevated blood pressure    Erythema nodosum    Heart murmur    Insomnia    lung ca dx'd 10/1999   chemo/xrt comp 05/29/2000   Myalgia    Nephrolithiasis 2015   Obesity    Pericardial effusion    a. 05/2015 Echo: EF 55-60%, no rwma, Gr 1 DD, no effusion - but an effusion was seen on CT which was felt to be increased in size compared to 12/2014 - pericardium also thickened.    Pericarditis    a. Dx 05/2015.   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/13/2017   Unstable angina ([place]) 04/12/2017   Atrial fibrillation with RVR ([place])    Hyperlipidemia 08/07/2015   CAD S/P percutaneous coronary angioplasty 07/25/2015   Hypertension, essential    Aortic insufficiency    Stented coronary artery    Abnormal nuclear stress test 07/11/2015   Abnormal stress test 07/04/2015   Pericarditis    Controlled type 2 diabetes mellitus without complication, with long-term current use of insulin (HCC)    Pericardial effusion    Chest pain 05/30/2015   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   CARDIAC CATHETERIZATION N/A 07/11/2015   Procedure: Left Heart Cath and Coronary Angiography;  Surgeon: Jettie Booze, MD;  Location: [facility];  Service: Cardiovascular;  Laterality: N/A;   CARDIAC CATHETERIZATION  07/11/2015   Procedure: Coronary Stent Intervention;  Surgeon: Jettie Booze, MD;  Location: [facility];  Service: Cardiovascular;;   CARPAL TUNNEL RELEASE Bilateral 1998-2005?   right-left    CARPAL TUNNEL RELEASE Left 2009?   "for the 2nd time"   COLONOSCOPY     COLONOSCOPY WITH PROPOFOL N/A 05/28/2018   Procedure: COLONOSCOPY WITH PROPOFOL;  Surgeon: Milus Banister, MD;  Location: [facility];  Service: Endoscopy;  Laterality: N/A;   CORONARY ANGIOPLASTY     CYSTOSCOPY W/ STONE MANIPULATION  2013   ESOPHAGOGASTRODUODENOSCOPY (EGD) WITH ESOPHAGEAL DILATION  2012   FOOT SURGERY Bilateral 1980's   Callous removed    INNER EAR SURGERY Right ~ 2009   KNEE ARTHROPLASTY Right 1991   KNEE ARTHROSCOPY  ~ 2000   LATERAL RELEASE   LEFT HEART CATH AND CORONARY ANGIOGRAPHY N/A 12/18/2016   Procedure: Left Heart Cath and Coronary Angiography;  Surgeon: Burnell Blanks, MD;  Location: [facility];  Service: Cardiovascular;  Laterality: N/A;   LUNG CANCER SURGERY Left 2001   "small cell"   LUNG SURGERY Right 2007   "reinflated it"   PULMONARY EMBOLISM SURGERY  2007   LUNG COLLAPSE   RIGHT AND LEFT HEART CATH N/A 06/25/2017   Procedure: RIGHT AND LEFT HEART CATH;  Surgeon: Sherren Mocha, MD;  Location: [facility];  Service: Cardiovascular;  Laterality: N/A;   SHOULDER ADHESION RELEASE Right ~ 2004   SHOULDER ARTHROSCOPY W/ ROTATOR CUFF REPAIR Right ~ 2003    Current Outpatient Rx   Order #: [id number] Class: Historical Med   Order #: [id number] Class: Normal   Order #: [id number] Class: Normal   Order #: [id number] Class: Print   Order #: [id number] Class: Normal   Order #: [id number] Class: Normal   Order #: [id number] Class: Historical Med   Order #: [id number] Class: Historical Med   Order #: [id number] Class: Normal   Order #: [id number] Class: Normal   Order #: [id number] Class: Normal   Order #: [id number] Class: Historical Med   Order #: [id number] Class: Normal   Order #: [id number] Class: Normal   Order #: [id number] Class: Normal   Order #: [id number] Class: Historical Med    Allergies Hydrocodone; Hydrocodone-acetaminophen; Morphine and  related; Rosuvastatin; Lisinopril; and Nsaids  Family History  Problem Relation Age of Onset   Heart disease Father    Diabetes Father    Pancreatic cancer Mother    Diabetes Mother    Hypertension Sister        x 3   Diabetes Sister        x 4   Diabetes Maternal Grandmother    Diabetes Paternal Grandmother    Diabetes Sister    Diabetes Sister    Diabetes Sister    Kidney disease Sister    Colon cancer Neg Hx    Colon polyps Neg Hx    Rectal cancer Neg Hx    Stomach cancer Neg Hx    Esophageal cancer Neg Hx    Breast cancer Neg Hx     Social History Social History   Tobacco Use  Smoking status: Former Smoker    Packs/day: 0.25    Years: 5.00    Pack years: 1.25    Types: Cigarettes    Last attempt to quit: 07/22/1999    Years since quitting: 19.6   Smokeless tobacco: Never Used  Substance Use Topics   Alcohol use: No    Alcohol/week: 0.0 standard drinks   Drug use: No    Review of Systems  All other systems negative except as documented in the HPI. All pertinent positives and negatives as reviewed in the HPI. ____________________________________________   PHYSICAL EXAM:  VITAL SIGNS: ED Triage Vitals  Enc Vitals Group     BP 03/13/19 0309 (!) 141/81     Pulse Rate 03/13/19 0309 88     Resp 03/13/19 0309 17     Temp 03/13/19 0309 99.1 F (37.3 C)     Temp Source 03/13/19 0309 Oral     SpO2 03/13/19 0309 96 %     Weight 03/13/19 0309 243 lb (110.2 kg)     Height 03/13/19 0309 5' 7.5" (1.715 m)    Constitutional: Alert and oriented. Well appearing and in no acute distress. Eyes: Conjunctivae are normal. PERRL. EOMI. Head: Atraumatic. Nose: No congestion/rhinnorhea. Mouth/Throat: Mucous membranes are moist.  Oropharynx non-erythematous. Neck: No stridor.  No meningeal signs.   Cardiovascular: Normal rate, regular rhythm. Good peripheral circulation. Grossly normal heart sounds.   Respiratory: Normal respiratory effort.  No  retractions. Lungs CTAB. Gastrointestinal: Soft and nontender. No distention.  Musculoskeletal: No lower extremity tenderness nor edema. No gross deformities of extremities. Neurologic:  Normal speech and language. No gross focal neurologic deficits are appreciated.  Skin:  Skin is warm, dry and intact. No rash noted.   ____________________________________________   LABS (all labs ordered are listed, but only abnormal results are displayed)  Labs Reviewed  BASIC METABOLIC PANEL - Abnormal; Notable for the following components:      Result Value   Potassium 3.4 (*)    All other components within normal limits  CBC - Abnormal; Notable for the following components:   RBC 3.42 (*)    Hemoglobin 10.8 (*)    HCT 33.2 (*)    All other components within normal limits  TROPONIN I  I-STAT BETA HCG BLOOD, ED ([facility], [facility], [facility] ONLY)   ____________________________________________  EKG   EKG Interpretation  Date/Time:  Saturday Mar 13 2019 03:11:51 EDT Ventricular Rate:  87 PR Interval:  140 QRS Duration: 98 QT Interval:  418 QTC Calculation: 502 R Axis:   71 Text Interpretation:  Normal sinus rhythm Prolonged QT Abnormal ECG No significant change since last tracing Confirmed by Merrily Pew [id number]) on 03/13/2019 5:30:40 AM       ____________________________________________  RADIOLOGY  Dg Chest 2 View  Result Date: 03/13/2019 CLINICAL DATA:  63 y/o  F; chest pain. EXAM: CHEST - 2 VIEW COMPARISON:  02/16/2019 chest radiograph FINDINGS: Stable cardiomegaly given projection and technique. Aortic atherosclerosis with calcification. Stable postsurgical changes in the upper lobes and posttreatment changes in the left upper lobe. No new consolidation. No pleural effusion or pneumothorax. No acute osseous abnormality is evident. IMPRESSION: Stable cardiomegaly. No acute pulmonary process identified. Electronically Signed   By: Kristine Garbe M.D.   On: 03/13/2019 04:15   Vas [place] Le  Art Seg Multi (segm&le Reynauds)  Result Date: 03/12/2019 LOWER EXTREMITY DOPPLER STUDY Indications: Patient complains of pain at the lesion present Left foot at the  4th toe for the last 3 months. Patient has tried neosporin and PCP              gave antibiotics that she finished last week. Became concerned              because 4th toe started to turn dark, worse with shoes and reports              that sheet hurts it. Denies drainage, open wound, warmth or              redness. Patient denies any other pedal complaints. High Risk Factors: Hypertension, hyperlipidemia, Diabetes, past history of                    smoking, coronary artery disease.  Performing Technologist: Wilkie Aye RVT  Examination Guidelines: A complete evaluation includes at minimum, Doppler waveform signals and systolic blood pressure reading at the level of bilateral brachial, anterior tibial, and posterior tibial arteries, when vessel segments are accessible. Bilateral testing is considered an integral part of a complete examination. Photoelectric Plethysmograph (PPG) waveforms and toe systolic pressure readings are included as required and additional duplex testing as needed. Limited examinations for reoccurring indications may be performed as noted.  ABI Findings: +---------+------------------+-----+---------+--------+  Right     Rt Pressure (mmHg) Index Waveform  Comment   +---------+------------------+-----+---------+--------+  Brachial  125                                          +---------+------------------+-----+---------+--------+  CFA                                triphasic           +---------+------------------+-----+---------+--------+  Popliteal                          triphasic           +---------+------------------+-----+---------+--------+  ATA       140                1.12  triphasic           +---------+------------------+-----+---------+--------+  PTA       138                1.10  triphasic            +---------+------------------+-----+---------+--------+  PERO      145                1.16  triphasic           +---------+------------------+-----+---------+--------+  Great Toe 169                1.35  Normal              +---------+------------------+-----+---------+--------+ +---------+------------------+-----+---------+-------+  Left      Lt Pressure (mmHg) Index Waveform  Comment  +---------+------------------+-----+---------+-------+  Brachial  117                                         +---------+------------------+-----+---------+-------+  CFA  triphasic          +---------+------------------+-----+---------+-------+  Popliteal                          triphasic          +---------+------------------+-----+---------+-------+  ATA       128                1.02  triphasic          +---------+------------------+-----+---------+-------+  PTA       146                1.17  triphasic          +---------+------------------+-----+---------+-------+  PERO      149                1.19  triphasic          +---------+------------------+-----+---------+-------+  Great Toe 163                1.30  Normal             +---------+------------------+-----+---------+-------+ +-------+-----------+-----------+------------+------------+  ABI/TBI Today's ABI Today's TBI Previous ABI Previous TBI  +-------+-----------+-----------+------------+------------+  Right   1.16        1.35                                   +-------+-----------+-----------+------------+------------+  Left    1.19        1.30                                   +-------+-----------+-----------+------------+------------+ TOES Findings: +----------+---------------+--------+-------+  Right Toes Pressure (mmHg) Waveform Comment  +----------+---------------+--------+-------+  1st Digit                  Normal            +----------+---------------+--------+-------+  2nd Digit                  Normal             +----------+---------------+--------+-------+  3rd Digit                  Normal            +----------+---------------+--------+-------+  4th Digit                  Normal            +----------+---------------+--------+-------+  5th Digit                  Normal            +----------+---------------+--------+-------+ +---------+---------------+--------+-------+  Left Toes Pressure (mmHg) Waveform Comment  +---------+---------------+--------+-------+  1st Digit                 Normal            +---------+---------------+--------+-------+  2nd Digit                 Normal            +---------+---------------+--------+-------+  3rd Digit                 Normal            +---------+---------------+--------+-------+  4th Digit  Normal            +---------+---------------+--------+-------+  5th Digit                 Normal            +---------+---------------+--------+-------+   Summary: Right: Resting right ankle-brachial index is within normal range. No evidence of significant right lower extremity arterial disease. The right toe-brachial index is normal. Left: Resting left ankle-brachial index is within normal range. No evidence of significant left lower extremity arterial disease. The left toe-brachial index is normal.  *See table(s) above for measurements and observations.  Electronically signed by Carlyle Dolly MD on 03/12/2019 at 2:51:33 PM.    Final     ____________________________________________   PROCEDURES  Procedure(s) performed:   Procedures   ____________________________________________   INITIAL IMPRESSION / ASSESSMENT AND PLAN / ED COURSE  Stable. NSR here.  ECG ok, first trop ok, high risk 2/2 h/o but unlikely ACS, will get second trop.  Doubt PE without risk factors and already anticoagulated.   Second troponin ok. No pain. Outpatient follow up recommended.   Pertinent labs & imaging results that were available during my care of the patient were reviewed  by me and considered in my medical decision making (see chart for details).  A medical screening exam was performed and I feel the patient has had an appropriate workup for their chief complaint at this time and likelihood of emergent condition existing is low. They have been counseled on decision, discharge, follow up and which symptoms necessitate immediate return to the emergency department. They or their family verbally stated understanding and agreement with plan and discharged in stable condition.   ____________________________________________  FINAL CLINICAL IMPRESSION(S) / ED DIAGNOSES  Final diagnoses:  None     MEDICATIONS GIVEN DURING THIS VISIT:  Medications  sodium chloride flush (NS) 0.9 % injection 3 mL (has no administration in time range)     NEW OUTPATIENT MEDICATIONS STARTED DURING THIS VISIT:  New Prescriptions   No medications on file    Note:  This note was prepared with assistance of Dragon voice recognition software. Occasional wrong-word or sound-a-like substitutions may have occurred due to the inherent limitations of voice recognition software.   Jaedin Regina, Corene Cornea, MD 03/14/19 [phone]

## 2019-03-13 NOTE — ED Triage Notes (Signed)
C/o "thumping" pain to center of chest since 10pm with SOB.  Denies nausea and vomiting.  States HR elevated at home.  Admitted 4/28 for afib with RVR.

## 2019-03-15 NOTE — Progress Notes (Signed)
Virtual Visit via Video   This visit type was conducted due to national recommendations for restrictions regarding the COVID-19 Pandemic (e.g. social distancing) in an effort to limit this patient's exposure and mitigate transmission in our community.  Due to her co-morbid illnesses, this patient is at least at moderate risk for complications without adequate follow up.  This format is felt to be most appropriate for this patient at this time.  All issues noted in this document were discussed and addressed.  A limited physical exam was performed with this format.    This visit type was conducted due to national recommendations for restrictions regarding the COVID-19 Pandemic (e.g. social distancing) in an effort to limit this patient's exposure and mitigate transmission in our community.  Patients identity confirmed using two different identifiers.  This format is felt to be most appropriate for this patient at this time.  All issues noted in this document were discussed and addressed.  No physical exam was performed (except for noted visual exam findings with Video Visits).    Date:  03/15/2019   ID:  [name], DOB [birth year], MRN [id number]  Patient Location:  Home  Provider location:   Office    Chief Complaint:  Diabetes check  History of Present Illness:    [name] is a 63 y.o. female who presents via video conferencing for a telehealth visit today.    The patient does not have symptoms concerning for COVID-19 infection (fever, chills, cough, or new shortness of breath).   She presents today for virtual visit. She prefers this method of contact due to COVID-19 pandemic.  She reports practicing social distancing. Her friend advised her [facility] was open, she is happy to go check them out today after her appt. She reports she does wear a mask in public.  Diabetes  She presents for her follow-up diabetic visit. She has type 2 diabetes mellitus. Her disease course has been  stable. There are no hypoglycemic associated symptoms. Pertinent negatives for diabetes include no blurred vision and no chest pain. There are no hypoglycemic complications. Symptoms are stable. Diabetic complications include nephropathy. Risk factors for coronary artery disease include diabetes mellitus, dyslipidemia, hypertension, sedentary lifestyle, post-menopausal and obesity. She is following a diabetic diet. When asked about meal planning, she reported none. She participates in exercise intermittently.  Hypertension  This is a chronic problem. The current episode started more than 1 year ago. The problem has been gradually improving since onset. The problem is controlled. Pertinent negatives include no blurred vision, chest pain, palpitations or shortness of breath. Risk factors for coronary artery disease include diabetes mellitus, obesity, dyslipidemia, sedentary lifestyle and post-menopausal state.     Past Medical History:  Diagnosis Date  . Allergy   . Anemia   . Anemia   . Aortic insufficiency    a. Mod by echo 05/2015.  . Arthritis    "knees" (07/11/2015)  . Atrial fibrillation (HCC)    On Eliquis  . 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 Complication of anesthesia    "I had a hard time waking me up w/one of my shoulder surgeries"  . Coronary artery disease    a. Abnormal stress test -> LHc 06/2015 s/p DES to mCX and mLAD, residual D2 disease treated medially.  . Diverticulitis   . Elevated blood pressure   . Erythema nodosum   .  Heart murmur   . Insomnia   . lung ca dx'd 10/1999   chemo/xrt comp 05/29/2000  . Myalgia   . Nephrolithiasis 2015  . Obesity   . Pericardial effusion    a. 05/2015 Echo: EF 55-60%, no rwma, Gr 1 DD, no effusion - but an effusion was seen on CT which was felt to be increased in size compared to 12/2014 - pericardium also thickened.    . Pericarditis    a. Dx 05/2015.  [name] Pneumothorax, spontaneous, tension 01/20/2012   October, 2010  . Pulmonary fibrosis ([facility]) 01/20/2012   Due to previous surgery and chest radiation for lung cancer  . Restrictive lung disease   . Type II diabetes mellitus ([facility])    Past Surgical History:  Procedure Laterality Date  . ABDOMINAL HYSTERECTOMY  2008  . APPENDECTOMY  1969?  [name] CARDIAC CATHETERIZATION N/A 07/11/2015   Procedure: Left Heart Cath and Coronary Angiography;  Surgeon: Jettie Booze, MD;  Location: [facility];  Service: Cardiovascular;  Laterality: N/A;  . CARDIAC CATHETERIZATION  07/11/2015   Procedure: Coronary Stent Intervention;  Surgeon: Jettie Booze, MD;  Location: [facility];  Service: Cardiovascular;;  . CARPAL TUNNEL RELEASE Bilateral 1998-2005?   right-left  . CARPAL TUNNEL RELEASE Left 2009?   "for the 2nd time"  . COLONOSCOPY    . COLONOSCOPY WITH PROPOFOL N/A 05/28/2018   Procedure: COLONOSCOPY WITH PROPOFOL;  Surgeon: Milus Banister, MD;  Location: [facility];  Service: Endoscopy;  Laterality: N/A;  . CORONARY ANGIOPLASTY    . CYSTOSCOPY W/ STONE MANIPULATION  2013  . ESOPHAGOGASTRODUODENOSCOPY (EGD) WITH ESOPHAGEAL DILATION  2012  . FOOT SURGERY Bilateral 1980's   Callous removed   . INNER EAR SURGERY Right ~ 2009  . KNEE ARTHROPLASTY Right 1991  . KNEE ARTHROSCOPY  ~ 2000   LATERAL RELEASE  . LEFT HEART CATH AND CORONARY ANGIOGRAPHY N/A 12/18/2016   Procedure: Left Heart Cath and Coronary Angiography;  Surgeon: Burnell Blanks, MD;  Location: [facility];  Service: Cardiovascular;  Laterality: N/A;  . LUNG CANCER SURGERY Left 2001   "small cell"  . LUNG SURGERY Right 2007   "reinflated it"  . PULMONARY EMBOLISM SURGERY  2007   LUNG COLLAPSE  . RIGHT AND LEFT HEART CATH N/A 06/25/2017   Procedure: RIGHT AND LEFT HEART CATH;  Surgeon: Sherren Mocha, MD;  Location: [facility];  Service: Cardiovascular;   Laterality: N/A;  . SHOULDER ADHESION RELEASE Right ~ 2004  . SHOULDER ARTHROSCOPY W/ ROTATOR CUFF REPAIR Right ~ 2003     Current Meds  Medication Sig  . B-D UF III MINI PEN NEEDLES 31G X 5 MM MISC 2 (two) times daily. as directed  . co-enzyme Q-10 50 MG capsule TAKE 1 CAPSULE BY MOUTH EVERY DAY  . diltiazem (CARDIZEM CD) 240 MG 24 hr capsule TAKE 1 CAPSULE BY MOUTH EVERY DAY  . dofetilide (TIKOSYN) 500 MCG capsule Take 1 capsule (500 mcg total) by mouth 2 (two) times daily.  [name] ELIQUIS 5 MG TABS tablet TAKE 1 TABLET BY MOUTH TWICE A DAY  . furosemide (LASIX) 40 MG tablet TAKE 1 TABLET BY MOUTH EVERY DAY  . HUMALOG MIX 75/25 KWIKPEN (75-25) 100 UNIT/ML Kwikpen Inject 30-40 Units into the skin See admin instructions. Inject 40 units SQ in Am and 35 units SQ in PM  . levothyroxine (SYNTHROID, LEVOTHROID) 50 MCG tablet Take 50 mcg by mouth daily before breakfast.   .  losartan (COZAAR) 25 MG tablet TAKE 1 TABLET (25 MG TOTAL) BY MOUTH DAILY.  . metoprolol tartrate (LOPRESSOR) 25 MG tablet TAKE 1 TABLET (25 MG TOTAL) BY MOUTH 2 (TWO) TIMES DAILY.  . nitroGLYCERIN (NITROSTAT) 0.4 MG SL tablet Place 1 tablet (0.4 mg total) under the tongue every 5 (five) minutes as needed for chest pain.  . ONE TOUCH ULTRA TEST test strip USE AS DIRECTED 3 TIMES A DAY  . pravastatin (PRAVACHOL) 20 MG tablet TAKE 1 TABLET BY MOUTH EVERY DAY IN THE EVENING (Patient taking differently: Take 20 mg by mouth daily. )  . Semaglutide,0.25 or 0.5MG /DOS, (OZEMPIC, 0.25 OR 0.5 MG/DOSE,) 2 MG/1.5ML SOPN Inject 0.5 mg into the skin once a week.  . spironolactone (ALDACTONE) 25 MG tablet Take 1 tablet (25 mg total) by mouth daily.  [name] tiZANidine (ZANAFLEX) 4 MG tablet Take 4 mg by mouth every 8 (eight) hours as needed for muscle spasms.     Allergies:   Hydrocodone; Hydrocodone-acetaminophen; Morphine and related; Rosuvastatin; Lisinopril; and Nsaids   Social History   Tobacco Use  . Smoking status: Former Smoker     Packs/day: 0.25    Years: 5.00    Pack years: 1.25    Types: Cigarettes    Last attempt to quit: 07/22/1999    Years since quitting: 19.6  . Smokeless tobacco: Never Used  Substance Use Topics  . Alcohol use: No    Alcohol/week: 0.0 standard drinks  . Drug use: No     Family Hx: The patient's family history includes Diabetes in her father, maternal grandmother, mother, paternal grandmother, sister, sister, sister, and sister; Heart disease in her father; Hypertension in her sister; Kidney disease in her sister; Pancreatic cancer in her mother. There is no history of Colon cancer, Colon polyps, Rectal cancer, Stomach cancer, Esophageal cancer, or Breast cancer.  ROS:   Please see the history of present illness.    Review of Systems  Constitutional: Negative.   Eyes: Negative for blurred vision.  Respiratory: Negative.  Negative for shortness of breath.   Cardiovascular: Negative.  Negative for chest pain and palpitations.  Gastrointestinal: Negative.   Neurological: Negative.   Psychiatric/Behavioral: Negative.     All other systems reviewed and are negative.   Labs/Other Tests and Data Reviewed:    Recent Labs: 09/01/2018: ALT 16 02/17/2019: B Natriuretic Peptide 46.5 03/09/2019: Magnesium 2.1; TSH 3.340 03/13/2019: BUN 20; Creatinine, Ser 0.96; Hemoglobin 10.8; Platelets 277; Potassium 3.4; Sodium 141   Recent Lipid Panel Lab Results  Component Value Date/Time   CHOL 131 03/09/2019 08:57 AM   TRIG 77 03/09/2019 08:57 AM   HDL 48 03/09/2019 08:57 AM   CHOLHDL 2.7 03/09/2019 08:57 AM   CHOLHDL 3.1 02/17/2019 02:15 AM   LDLCALC 68 03/09/2019 08:57 AM    Wt Readings from Last 3 Encounters:  03/13/19 243 lb (110.2 kg)  03/08/19 241 lb (109.3 kg)  02/25/19 237 lb (107.5 kg)     Exam:    Vital Signs:  Ht 5' 7.5" (1.715 m)   Wt 241 lb (109.3 kg)   BMI 37.19 kg/m     Physical Exam  Constitutional: She is oriented to person, place, and time and well-developed,  well-nourished, and in no distress.  HENT:  Head: Normocephalic and atraumatic.  Pulmonary/Chest: Effort normal.  Neurological: She is alert and oriented to person, place, and time.  Psychiatric: Affect normal.  Nursing note and vitals reviewed.   ASSESSMENT & PLAN:     1.  Type 2 diabetes mellitus with stage 3 chronic kidney disease, with long-term current use of insulin ([place])  Labs from recent hospitalization were reviewed in full detail. She does agree to come in next week for additional labwork.  She is encouraged to incorporate more activity into her daily routine as tolerated.   - Lipid panel; Future - Magnesium; Future  2. Hypertensive heart and renal disease with renal failure, stage 1 through stage 4 or unspecified chronic kidney disease, without heart failure  Chronic. She will continue with current meds. She is encouraged to avoid adding salt to her foods.   3. Chronic atrial fibrillation  Chronic. D/C summary for recent hospitalization was reviewed in full detail during the visit.   4. Acquired hypothyroidism  She agrees to come in next week for labwork.  I will check thyroid panel and adjust meds as needed.  - TSH; Future - T4, Free; Future  5. Class 2 severe obesity due to excess calories with serious comorbidity and body mass index (BMI) of 37.0 to 37.9 in adult [facility])  Importance of achieving optimal weight to decrease risk of cardiovascular disease and cancers was discussed with the patient in full detail. She is encouraged to start slowly - start with 10 minutes twice daily at least three to four days per week and to gradually build to 30 minutes five days weekly. She was given tips to incorporate more activity into her daily routine - take stairs when possible, park farther away from grocery stores, etc.   6. Screening for STD (sexually transmitted disease)  - HIV antibody (with reflex); Future    COVID-19 Education: The signs and symptoms of COVID-19 were  discussed with the patient and how to seek care for testing (follow up with PCP or arrange E-visit).  The importance of social distancing was discussed today.  Patient Risk:   After full review of this patients clinical status, I feel that they are at least moderate risk at this time.  Time:   Today, I have spent 22 minutes with the patient with telehealth technology discussing above diagnoses.     Medication Adjustments/Labs and Tests Ordered: Current medicines are reviewed at length with the patient today.  Concerns regarding medicines are outlined above.   Tests Ordered: Orders Placed This Encounter  Procedures  . Lipid panel  . TSH  . T4, Free  . Magnesium  . HIV antibody (with reflex)    Medication Changes: No orders of the defined types were placed in this encounter.   Disposition:  Follow up in 3 month(s)  Signed, [name], MD

## 2019-03-16 ENCOUNTER — Telehealth: Payer: Self-pay | Admitting: Cardiology

## 2019-03-16 ENCOUNTER — Telehealth: Payer: Self-pay | Admitting: *Deleted

## 2019-03-16 NOTE — Telephone Encounter (Signed)
Patient endorses headaches started while starting tikosyn in hospital and they have continued since shes been home. Per Roderic Palau Np - documented in literature that some patients do have headaches with tikosyn. Most patients do have their headaches subside with continued use. Pt will continue to monitor headaches and discuss further with Dr. Curt Bears.

## 2019-03-16 NOTE — Telephone Encounter (Signed)
-----   Message from [place], [place] sent at 03/12/2019  6:28 PM EDT ----- Will you let patient know that her circulation is normal. There is no issue with blood flow in her feet and especially to her painful toe. The toe pain and color change is likely coming from her hammertoe deformity and from rubbing/corn site. If it continues to hurt she should make a follow up appointment to further discuss treatment options in office with me. Thanks Dr Chauncey Cruel

## 2019-03-16 NOTE — Telephone Encounter (Signed)
I informed pt of Dr. Leeanne Rio review of results and recommendations. Pt states understanding.

## 2019-03-16 NOTE — Telephone Encounter (Signed)
  Patient is calling because she was in the ER over the weekend and she is having headaches and would like to see if her medication can be changed.

## 2019-03-18 ENCOUNTER — Telehealth (HOSPITAL_COMMUNITY): Payer: Self-pay | Admitting: *Deleted

## 2019-03-18 ENCOUNTER — Telehealth: Payer: Self-pay | Admitting: *Deleted

## 2019-03-18 NOTE — Telephone Encounter (Signed)

## 2019-03-18 NOTE — Telephone Encounter (Signed)
Pt called in with complaints of lower extremity swelling. It has progressively gotten worse over the last several days. She denies weight gain with her daily weights. Discussed with Roderic Palau NP will increase lasix to 60mg  for the next 3 days then return to normal dosing. She also had a potassium of 3.4 in the ER over the weekend without replacement - will take 65meq of Kdur while on increased dose of lasix with follow up bmet on Monday. Pt in agreement.

## 2019-03-22 ENCOUNTER — Ambulatory Visit (HOSPITAL_COMMUNITY)
Admission: RE | Admit: 2019-03-22 | Discharge: 2019-03-22 | Disposition: A | Discharge status: Home / Self Care | Payer: Medicare Other | Source: Ambulatory Visit | Attending: Physician Assistant | Admitting: Physician Assistant

## 2019-03-22 ENCOUNTER — Other Ambulatory Visit: Payer: Self-pay

## 2019-03-22 DIAGNOSIS — I4891 Unspecified atrial fibrillation: Secondary | ICD-10-CM | POA: Diagnosis present

## 2019-03-22 LAB — BASIC METABOLIC PANEL
Anion gap: 8 (ref 5–15)
BUN: 21 mg/dL — ABNORMAL HIGH (ref 6–20)
CO2: 28 mmol/L (ref 22–32)
Calcium: 8.8 mg/dL — ABNORMAL LOW (ref 8.9–10.3)
Chloride: 104 mmol/L (ref 98–111)
Creatinine, Ser: 0.94 mg/dL (ref 0.44–1.00)
GFR calc Af Amer: >60 mL/min (ref >60)
GFR calc non Af Amer: >60 mL/min (ref >60)
Glucose, Bld: 113 mg/dL — ABNORMAL HIGH (ref 70–99)
Potassium: 3.9 mmol/L (ref 3.5–5.1)
Sodium: 140 mmol/L (ref 135–145)

## 2019-03-22 LAB — MAGNESIUM: Magnesium: 2.2 mg/dL (ref 1.7–2.4)

## 2019-03-26 ENCOUNTER — Telehealth (INDEPENDENT_AMBULATORY_CARE_PROVIDER_SITE_OTHER): Payer: Medicare Other | Admitting: Cardiology

## 2019-03-26 ENCOUNTER — Other Ambulatory Visit: Payer: Self-pay

## 2019-03-26 DIAGNOSIS — I480 Paroxysmal atrial fibrillation: Secondary | ICD-10-CM | POA: Diagnosis not present

## 2019-03-26 MED ORDER — DILTIAZEM HCL ER COATED BEADS 360 MG PO CP24: 360.0000 mg | ORAL_CAPSULE | Freq: Every day | ORAL | 3 refills | Status: DC

## 2019-03-26 NOTE — Progress Notes (Signed)
Electrophysiology TeleHealth Note   Due to national recommendations of social distancing due to COVID 19, an audio/video telehealth visit is felt to be most appropriate for this patient at this time.  See Epic message for the patient's consent to telehealth for [facility].   Date:  03/26/2019   ID:  [name], DOB [birth year], MRN [id number]  Location: patient's home  Provider location: [address] [place]  Evaluation Performed: Follow-up visit  PCP:  Glendale Chard, MD  Cardiologist:  Ena Dawley, MD  Electrophysiologist:  Dr Curt Bears  Chief Complaint:  AF  History of Present Illness:    [name] is a 63 y.o. female who presents via audio/video conferencing for a telehealth visit today.  Since last being seen in our clinic, the patient reports doing very well.  Today, she denies symptoms of palpitations, chest pain, shortness of breath,  lower extremity edema, dizziness, presyncope, or syncope.  The patient is otherwise without complaint today.  The patient denies symptoms of fevers, chills, cough, or new SOB worrisome for COVID 19.  She has a history significant for coronary artery disease status post circumflex and LAD stents, chronic pericardial effusion with chronic pericarditis and a pericardial rub, CHF, diabetes, hypertension, non-small cell lung cancer treated with surgery and chemotherapy, COPD, pulmonary fibrosis, and atrial fibrillation.  She was admitted to the hospital April 2020 and was loaded on dofetilide.  Today, denies symptoms of palpitations, chest pain, shortness of breath, orthopnea, PND, lower extremity edema, claudication, dizziness, presyncope, syncope, bleeding, or neurologic sequela. The patient is tolerating medications without difficulties.  She states that she went into atrial fibrillation in May.  She went to the emergency room, but her initial ECG showed sinus rhythm.  She was having atypical chest pain and was ruled out for  cardiac causes.  Since that time she has done well although she does note that her heart rate goes up when she does minimal activities.  Past Medical History:  Diagnosis Date  . Allergy   . Anemia   . Anemia   . Aortic insufficiency    a. Mod by echo 05/2015.  . Arthritis    "knees" (07/11/2015)  . Atrial fibrillation (HCC)    On Eliquis  . Carcinoma of hilus of lung ([place]) 01/20/2012   IIIB NSCL  Right hilar mass compressing esophagus/presenting with dysphagia Rx Surgery/RT/chemo dx January 2001  . Complication of anesthesia    "I had a hard time waking me up w/one of my shoulder surgeries"  . Coronary artery disease    a. Abnormal stress test -> LHc 06/2015 s/p DES to mCX and mLAD, residual D2 disease treated medially.  . Diverticulitis   . Elevated blood pressure   . Erythema nodosum   . Heart murmur   . Insomnia   . lung ca dx'd 10/1999   chemo/xrt comp 05/29/2000  . Myalgia   . Nephrolithiasis 2015  . Obesity   . Pericardial effusion    a. 05/2015 Echo: EF 55-60%, no rwma, Gr 1 DD, no effusion - but an effusion was seen on CT which was felt to be increased in size compared to 12/2014 - pericardium also thickened.   . Pericarditis    a. Dx 05/2015.  [name] Pneumothorax, spontaneous, tension 01/20/2012   October, 2010  . Pulmonary fibrosis ([place]) 01/20/2012   Due to previous surgery and chest radiation for lung cancer  . Restrictive lung disease   . Type II diabetes  mellitus [facility])     Past Surgical History:  Procedure Laterality Date  . ABDOMINAL HYSTERECTOMY  2008  . APPENDECTOMY  1969?  [name] CARDIAC CATHETERIZATION N/A 07/11/2015   Procedure: Left Heart Cath and Coronary Angiography;  Surgeon: Jettie Booze, MD;  Location: [facility];  Service: Cardiovascular;  Laterality: N/A;  . CARDIAC CATHETERIZATION  07/11/2015   Procedure: Coronary Stent Intervention;  Surgeon: Jettie Booze, MD;  Location: [facility];  Service: Cardiovascular;;  . CARPAL TUNNEL RELEASE  Bilateral 1998-2005?   right-left  . CARPAL TUNNEL RELEASE Left 2009?   "for the 2nd time"  . COLONOSCOPY    . COLONOSCOPY WITH PROPOFOL N/A 05/28/2018   Procedure: COLONOSCOPY WITH PROPOFOL;  Surgeon: Milus Banister, MD;  Location: [facility];  Service: Endoscopy;  Laterality: N/A;  . CORONARY ANGIOPLASTY    . CYSTOSCOPY W/ STONE MANIPULATION  2013  . ESOPHAGOGASTRODUODENOSCOPY (EGD) WITH ESOPHAGEAL DILATION  2012  . FOOT SURGERY Bilateral 1980's   Callous removed   . INNER EAR SURGERY Right ~ 2009  . KNEE ARTHROPLASTY Right 1991  . KNEE ARTHROSCOPY  ~ 2000   LATERAL RELEASE  . LEFT HEART CATH AND CORONARY ANGIOGRAPHY N/A 12/18/2016   Procedure: Left Heart Cath and Coronary Angiography;  Surgeon: Burnell Blanks, MD;  Location: [place];  Service: Cardiovascular;  Laterality: N/A;  . LUNG CANCER SURGERY Left 2001   "small cell"  . LUNG SURGERY Right 2007   "reinflated it"  . PULMONARY EMBOLISM SURGERY  2007   LUNG COLLAPSE  . RIGHT AND LEFT HEART CATH N/A 06/25/2017   Procedure: RIGHT AND LEFT HEART CATH;  Surgeon: Sherren Mocha, MD;  Location: [facility];  Service: Cardiovascular;  Laterality: N/A;  . SHOULDER ADHESION RELEASE Right ~ 2004  . SHOULDER ARTHROSCOPY W/ ROTATOR CUFF REPAIR Right ~ 2003    Current Outpatient Medications  Medication Sig Dispense Refill  . B-D UF III MINI PEN NEEDLES 31G X 5 MM MISC 2 (two) times daily. as directed    . co-enzyme Q-10 50 MG capsule TAKE 1 CAPSULE BY MOUTH EVERY DAY 30 capsule 7  . diltiazem (CARDIZEM CD) 240 MG 24 hr capsule TAKE 1 CAPSULE BY MOUTH EVERY DAY 90 capsule 0  . dofetilide (TIKOSYN) 500 MCG capsule Take 1 capsule (500 mcg total) by mouth 2 (two) times daily. 30 capsule 0  . ELIQUIS 5 MG TABS tablet TAKE 1 TABLET BY MOUTH TWICE A DAY 180 tablet 2  . furosemide (LASIX) 40 MG tablet TAKE 1 TABLET BY MOUTH EVERY DAY 90 tablet 3  . HUMALOG MIX 75/25 KWIKPEN (75-25) 100 UNIT/ML Kwikpen Inject 30-40  Units into the skin See admin instructions. Inject 40 units SQ in Am and 35 units SQ in PM  6  . levothyroxine (SYNTHROID, LEVOTHROID) 50 MCG tablet Take 50 mcg by mouth daily before breakfast.   6  . losartan (COZAAR) 25 MG tablet TAKE 1 TABLET (25 MG TOTAL) BY MOUTH DAILY. 90 tablet 2  . metoprolol tartrate (LOPRESSOR) 25 MG tablet TAKE 1 TABLET (25 MG TOTAL) BY MOUTH 2 (TWO) TIMES DAILY. 180 tablet 2  . nitroGLYCERIN (NITROSTAT) 0.4 MG SL tablet Place 1 tablet (0.4 mg total) under the tongue every 5 (five) minutes as needed for chest pain. 25 tablet 3  . ONE TOUCH ULTRA TEST test strip USE AS DIRECTED 3 TIMES A DAY    . pravastatin (PRAVACHOL) 20 MG tablet TAKE 1 TABLET  BY MOUTH EVERY DAY IN THE EVENING (Patient taking differently: Take 20 mg by mouth daily. ) 90 tablet 3  . Semaglutide,0.25 or 0.5MG /DOS, (OZEMPIC, 0.25 OR 0.5 MG/DOSE,) 2 MG/1.5ML SOPN Inject 0.5 mg into the skin once a week. 1 pen 1  . spironolactone (ALDACTONE) 25 MG tablet Take 1 tablet (25 mg total) by mouth daily. 90 tablet 0  . tiZANidine (ZANAFLEX) 4 MG tablet Take 4 mg by mouth every 8 (eight) hours as needed for muscle spasms.     No current facility-administered medications for this visit.     Allergies:   Hydrocodone; Hydrocodone-acetaminophen; Morphine and related; Rosuvastatin; Lisinopril; and Nsaids   Social History:  The patient  reports that she quit smoking about 19 years ago. Her smoking use included cigarettes. She has a 1.25 pack-year smoking history. She has never used smokeless tobacco. She reports that she does not drink alcohol or use drugs.   Family History:  The patient's  family history includes Diabetes in her father, maternal grandmother, mother, paternal grandmother, sister, sister, sister, and sister; Heart disease in her father; Hypertension in her sister; Kidney disease in her sister; Pancreatic cancer in her mother.   ROS:  Please see the history of present illness.   All other systems are  personally reviewed and negative.    Exam:    Vital Signs:  BP 123/72   Wt 240 lb (108.9 kg)   BMI 37.03 kg/m   Well appearing, alert and conversant, regular work of breathing,  good skin color Eyes- anicteric, neuro- grossly intact, skin- no apparent rash or lesions or cyanosis, mouth- oral mucosa is pink   Labs/Other Tests and Data Reviewed:    Recent Labs: 09/01/2018: ALT 16 02/17/2019: B Natriuretic Peptide 46.5 03/09/2019: TSH 3.340 03/13/2019: Hemoglobin 10.8; Platelets 277 03/22/2019: BUN 21; Creatinine, Ser 0.94; Magnesium 2.2; Potassium 3.9; Sodium 140   Wt Readings from Last 3 Encounters:  03/26/19 240 lb (108.9 kg)  03/13/19 243 lb (110.2 kg)  03/08/19 241 lb (109.3 kg)     Other studies personally reviewed: Additional studies/ records that were reviewed today include: ECG 03/15/2019 personally reviewed Review of the above records today demonstrates: Sinus rhythm   ASSESSMENT & PLAN:    1.  Paroxysmal atrial fibrillation: Currently on dofetilide.  She is continued to have rapid heart rates at times, though this has not been confirmed on ECG.  There is unclear to me if she is having actual atrial fibrillation.  We will increase her diltiazem to 360 mg.  This should help to control her heart rate.  Should she continue to have palpitations, would likely benefit from further cardiac monitoring.  This patients CHA2DS2-VASc Score and unadjusted Ischemic Stroke Rate (% per year) is equal to 4.8 % stroke rate/year from a score of 4  Above score calculated as 1 point each if present [CHF, HTN, DM, Vascular=MI/PAD/Aortic Plaque, Age if 65-74, or Female] Above score calculated as 2 points each if present [Age > 75, or Stroke/TIA/TE]  2.  Recurrent pericarditis: Followed in cardiology clinic.  Plan per primary cardiology.  3.  Diastolic heart failure: No signs of volume overload.   COVID 19 screen The patient denies symptoms of COVID 19 at this time.  The importance of  social distancing was discussed today.  Follow-up: 3 months  Current medicines are reviewed at length with the patient today.   The patient does not have concerns regarding her medicines.  The following changes were made today: Increase diltiazem  to 360 mg  Labs/ tests ordered today include:  No orders of the defined types were placed in this encounter.    Patient Risk:  after full review of this patients clinical status, I feel that they are at moderate risk at this time.  Today, I have spent 12 minutes with the patient with telehealth technology discussing atrial fibrillation.    Signed, Will Meredith Leeds, MD  03/26/2019 2:51 PM     [place] [address] [place] [address] [phone] (office) [phone] (fax)

## 2019-03-26 NOTE — Addendum Note (Signed)
Addended by: Stanton Kidney on: 03/26/2019 05:10 PM   Modules accepted: Orders

## 2019-03-29 ENCOUNTER — Other Ambulatory Visit: Payer: Self-pay | Admitting: Cardiology

## 2019-03-29 MED ORDER — DOFETILIDE 500 MCG PO CAPS: 500.0000 ug | ORAL_CAPSULE | Freq: Two times a day (BID) | ORAL | 3 refills | Status: DC

## 2019-03-29 NOTE — Telephone Encounter (Signed)
Pt's medication was sent to pt's pharmacy as requested. Confirmation received.  °

## 2019-03-29 NOTE — Telephone Encounter (Signed)
°  Patient is out of medication and currently at pharmacy!   1. Which medications need to be refilled? (please list name of each medication and dose if known) Dofetilide 533mcg capsule two times daily  2. Which pharmacy/location (including street and city if local pharmacy) is medication to be sent to?[facility] on [address] [place]  3. Do they need a 30 day or 90 day supply? [place]

## 2019-03-30 ENCOUNTER — Other Ambulatory Visit: Payer: Self-pay | Admitting: Internal Medicine

## 2019-04-12 ENCOUNTER — Ambulatory Visit: Payer: Self-pay | Admitting: Internal Medicine

## 2019-04-16 ENCOUNTER — Other Ambulatory Visit: Payer: Self-pay | Admitting: Cardiology

## 2019-05-25 LAB — HM DIABETES EYE EXAM

## 2019-06-29 ENCOUNTER — Ambulatory Visit (INDEPENDENT_AMBULATORY_CARE_PROVIDER_SITE_OTHER): Payer: Medicare Other | Admitting: Cardiology

## 2019-06-29 ENCOUNTER — Other Ambulatory Visit: Payer: Self-pay

## 2019-06-29 ENCOUNTER — Encounter: Payer: Self-pay | Admitting: Cardiology

## 2019-06-29 VITALS — BP 102/66 | HR 66 | Ht 67.5 in | Wt 247.4 lb

## 2019-06-29 DIAGNOSIS — R002 Palpitations: Secondary | ICD-10-CM

## 2019-06-29 DIAGNOSIS — I480 Paroxysmal atrial fibrillation: Secondary | ICD-10-CM

## 2019-06-29 NOTE — Patient Instructions (Addendum)
Medication Instructions:  Your physician recommends that you continue on your current medications as directed. Please refer to the Current Medication list given to you today.  * If you need a refill on your cardiac medications before your next appointment, please call your pharmacy.   Labwork: None ordered  Testing/Procedures: Your physician has recommended that you wear a 2 week monitor. Event monitors are medical devices that record the heart's electrical activity. Doctors most often [place] these monitors to diagnose arrhythmias. Arrhythmias are problems with the speed or rhythm of the heartbeat. The monitor is a small, portable device. You can wear one while you do your normal daily activities. This is usually used to diagnose what is causing palpitations/syncope (passing out).  Follow-Up: At [facility], you and your health needs are our priority.  As part of our continuing mission to provide you with exceptional heart care, we have created designated Provider Care Teams.  These Care Teams include your primary Cardiologist (physician) and Advanced Practice Providers (APPs -  Physician Assistants and Nurse Practitioners) who all work together to provide you with the care you need, when you need it.  You will need a follow up appointment in 6 months.  Please call our office 2 months in advance to schedule this appointment.  You may see Dr Curt Bears or one of the following Advanced Practice Providers on your designated Care Team:    Chanetta Marshall, NP  Tommye Standard, PA-C  Oda Kilts, [place]  Thank you for choosing [facility]!!   Trinidad Curet, RN [phone]  Any Other Special Instructions Will Be Listed Below (If Applicable).   Ambulatory Cardiac Monitoring An ambulatory cardiac monitor is a small recording device that is used to detect abnormal heart rhythms (arrhythmias). Most monitors are connected by wires to flat, sticky disks (electrodes) that are then attached to your chest.  You may need to wear a monitor if you have had symptoms such as:  Fast heartbeats (palpitations).  Dizziness.  Fainting or light-headedness.  Unexplained weakness.  Shortness of breath. There are several types of monitors. Some common monitors include:  Holter monitor. This records your heart rhythm continuously, usually for 24-48 hours.  Event (episodic) monitor. This monitor has a symptoms button, and when pushed, it will begin recording. You need to activate this monitor to record when you have a heart-related symptom.  Automatic detection monitor. This monitor will begin recording when it detects an abnormal heartbeat. What are the risks? Generally, these devices are safe to use. However, it is possible that the skin under the electrodes will become irritated. How to prepare for monitoring Your health care provider will prepare your chest for the electrode placement and show you how to use the monitor.  Do not apply lotions to your chest before monitoring.  Follow directions on how to care for the monitor, and how to return the monitor when the testing period is complete. How to use your cardiac monitor  Follow directions about how long to wear the monitor, and if you can take the monitor off in order to shower or bathe. ? Do not let the monitor get wet. ? Do not bathe, swim, or use a hot tub while wearing the monitor.  Keep your skin clean. Do not put body lotion or moisturizer on your chest.  Change the electrodes as told by your health care provider, or any time they stop sticking to your skin. You may need to use medical tape to keep them on.  Try  to put the electrodes in slightly different places on your chest to help prevent skin irritation. Follow directions from your health care provider about where to place the electrodes.  Make sure the monitor is safely clipped to your clothing or in a location close to your body as recommended by your health care provider.  If  your monitor has a symptoms button, press the button to mark an event as soon as you feel a heart-related symptom, such as: ? Dizziness. ? Weakness. ? Light-headedness. ? Palpitations. ? Thumping or pounding in your chest. ? Shortness of breath. ? Unexplained weakness.  Keep a diary of your activities, such as walking, doing chores, and taking medicine. It is very important to note what you were doing when you pushed the button to record your symptoms. This will help your health care provider determine what might be contributing to your symptoms.  Send the recorded information as recommended by your health care provider. It may take some time for your health care provider to process the results.  Change the batteries as told by your health care provider.  Keep electronic devices away from your monitor. These include: ? Tablets. ? MP3 players. ? Cell phones.  While wearing your monitor you should avoid: ? Electric blankets. ? Armed forces operational officer. ? Electric toothbrushes. ? Microwave ovens. ? Magnets. ? Metal detectors. Get help right away if:  You have chest pain.  You have shortness of breath or extreme difficulty breathing.  You develop a very fast heartbeat that does not get better.  You develop dizziness that does not go away.  You faint or constantly feel like you are about to faint. Summary  An ambulatory cardiac monitor is a small recording device that is used to detect abnormal heart rhythms (arrhythmias).  Make sure you understand how to send the information from the monitor to your health care provider.  It is important to press the button on the monitor when you have any heart-related symptoms.  Keep a diary of your activities, such as walking, doing chores, and taking medicine. It is very important to note what you were doing when you pushed the button to record your symptoms. This will help your health care provider learn what might be causing your symptoms.  This information is not intended to replace advice given to you by your health care provider. Make sure you discuss any questions you have with your health care provider. Document Released: 07/16/2008 Document Revised: 09/19/2017 Document Reviewed: 09/21/2016 Elsevier Patient Education  2020 [employer].

## 2019-06-29 NOTE — Progress Notes (Signed)
Electrophysiology Office Note   Date:  06/29/2019   ID:  [name], DOB May 18, 1956, MRN [id number]  PCP:  Glendale Chard, MD  Cardiologist:  [name] Primary Electrophysiologist:  Will Meredith Leeds, MD    Chief Complaint: AF   History of Present Illness: [name] is a 63 y.o. female who is being seen today for the evaluation of AF at the request of Glendale Chard, MD. Presenting today for electrophysiology evaluation.  She has a history of coronary artery disease status post circumflex and LAD stents, chronic pericardial effusion with chronic pericarditis and a friction rub, CHF, diabetes, hypertension, non-small cell lung cancer status post surgery and chemotherapy, COPD, pulmonary fibrosis, and atrial fibrillation.  She was admitted to the hospital April 2020 and has since been loaded on dofetilide.  Today, she denies symptoms of chest pain, shortness of breath, orthopnea, PND, lower extremity edema, claudication, dizziness, presyncope, syncope, bleeding, or neurologic sequela. The patient is tolerating medications without difficulties.  She does note occasional palpitations that are quite worrisome.  Her palpitations are associated with a vibrating sensation in her chest.  They occur for a few seconds multiple times a week.  There does not seem to be any exacerbating or alleviating factor.   Past Medical History:  Diagnosis Date  . Allergy   . Anemia   . Anemia   . Aortic insufficiency    a. Mod by echo 05/2015.  . Arthritis    "knees" (07/11/2015)  . Atrial fibrillation (HCC)    On Eliquis  . Carcinoma of hilus of lung ([place]) 01/20/2012   IIIB NSCL  Right hilar mass compressing esophagus/presenting with dysphagia Rx Surgery/RT/chemo dx January 2001  . Complication of anesthesia    "I had a hard time waking me up w/one of my shoulder surgeries"  . Coronary artery disease    a. Abnormal stress test -> LHc 06/2015 s/p DES to mCX and mLAD, residual D2 disease treated  medially.  . Diverticulitis   . Elevated blood pressure   . Erythema nodosum   . Heart murmur   . Insomnia   . lung ca dx'd 10/1999   chemo/xrt comp 05/29/2000  . Myalgia   . Nephrolithiasis 2015  . Obesity   . Pericardial effusion    a. 05/2015 Echo: EF 55-60%, no rwma, Gr 1 DD, no effusion - but an effusion was seen on CT which was felt to be increased in size compared to 12/2014 - pericardium also thickened.   . Pericarditis    a. Dx 05/2015.  [name] Pneumothorax, spontaneous, tension 01/20/2012   October, 2010  . Pulmonary fibrosis ([place]) 01/20/2012   Due to previous surgery and chest radiation for lung cancer  . Restrictive lung disease   . Type II diabetes mellitus ([place])    Past Surgical History:  Procedure Laterality Date  . ABDOMINAL HYSTERECTOMY  2008  . APPENDECTOMY  1969?  [name] CARDIAC CATHETERIZATION N/A 07/11/2015   Procedure: Left Heart Cath and Coronary Angiography;  Surgeon: Jettie Booze, MD;  Location: [facility];  Service: Cardiovascular;  Laterality: N/A;  . CARDIAC CATHETERIZATION  07/11/2015   Procedure: Coronary Stent Intervention;  Surgeon: Jettie Booze, MD;  Location: [facility];  Service: Cardiovascular;;  . CARPAL TUNNEL RELEASE Bilateral 1998-2005?   right-left  . CARPAL TUNNEL RELEASE Left 2009?   "for the 2nd time"  . COLONOSCOPY    . COLONOSCOPY WITH PROPOFOL N/A 05/28/2018   Procedure: COLONOSCOPY WITH PROPOFOL;  Surgeon: Milus Banister, MD;  Location: [facility];  Service: Endoscopy;  Laterality: N/A;  . CORONARY ANGIOPLASTY    . CYSTOSCOPY W/ STONE MANIPULATION  2013  . ESOPHAGOGASTRODUODENOSCOPY (EGD) WITH ESOPHAGEAL DILATION  2012  . FOOT SURGERY Bilateral 1980's   Callous removed   . INNER EAR SURGERY Right ~ 2009  . KNEE ARTHROPLASTY Right 1991  . KNEE ARTHROSCOPY  ~ 2000   LATERAL RELEASE  . LEFT HEART CATH AND CORONARY ANGIOGRAPHY N/A 12/18/2016   Procedure: Left Heart Cath and Coronary Angiography;  Surgeon: Burnell Blanks, MD;  Location: [facility];  Service: Cardiovascular;  Laterality: N/A;  . LUNG CANCER SURGERY Left 2001   "small cell"  . LUNG SURGERY Right 2007   "reinflated it"  . PULMONARY EMBOLISM SURGERY  2007   LUNG COLLAPSE  . RIGHT AND LEFT HEART CATH N/A 06/25/2017   Procedure: RIGHT AND LEFT HEART CATH;  Surgeon: Sherren Mocha, MD;  Location: [facility];  Service: Cardiovascular;  Laterality: N/A;  . SHOULDER ADHESION RELEASE Right ~ 2004  . SHOULDER ARTHROSCOPY W/ ROTATOR CUFF REPAIR Right ~ 2003     Current Outpatient Medications  Medication Sig Dispense Refill  . B-D UF III MINI PEN NEEDLES 31G X 5 MM MISC 2 (two) times daily. as directed    . co-enzyme Q-10 50 MG capsule TAKE 1 CAPSULE BY MOUTH EVERY DAY 30 capsule 7  . diltiazem (CARDIZEM CD) 360 MG 24 hr capsule Take 1 capsule (360 mg total) by mouth daily. 30 capsule 3  . dofetilide (TIKOSYN) 500 MCG capsule Take 1 capsule (500 mcg total) by mouth 2 (two) times daily. 180 capsule 3  . ELIQUIS 5 MG TABS tablet TAKE 1 TABLET BY MOUTH TWICE A DAY 180 tablet 2  . furosemide (LASIX) 40 MG tablet Take 40 mg by mouth as needed.    [name] HUMALOG MIX 75/25 KWIKPEN (75-25) 100 UNIT/ML Kwikpen Inject 30-40 Units into the skin See admin instructions. Inject 40 units SQ in Am and 35 units SQ in PM  6  . levothyroxine (SYNTHROID, LEVOTHROID) 50 MCG tablet Take 50 mcg by mouth daily before breakfast.   6  . losartan (COZAAR) 25 MG tablet TAKE 1 TABLET (25 MG TOTAL) BY MOUTH DAILY. 90 tablet 2  . metoprolol tartrate (LOPRESSOR) 25 MG tablet TAKE 1 TABLET (25 MG TOTAL) BY MOUTH 2 (TWO) TIMES DAILY. 180 tablet 2  . nitroGLYCERIN (NITROSTAT) 0.4 MG SL tablet Place 1 tablet (0.4 mg total) under the tongue every 5 (five) minutes as needed for chest pain. 25 tablet 3  . ONE TOUCH ULTRA TEST test strip USE AS DIRECTED 3 TIMES A DAY    . OZEMPIC, 0.25 OR 0.5 MG/DOSE, 2 MG/1.5ML SOPN INJECT 0.5 MG INTO THE SKIN ONCE A WEEK. 3 pen 3  .  pravastatin (PRAVACHOL) 20 MG tablet TAKE 1 TABLET BY MOUTH EVERY DAY IN THE EVENING 90 tablet 3  . spironolactone (ALDACTONE) 25 MG tablet TAKE 1 TABLET BY MOUTH EVERY DAY 90 tablet 0  . tiZANidine (ZANAFLEX) 4 MG tablet Take 4 mg by mouth every 8 (eight) hours as needed for muscle spasms.     No current facility-administered medications for this visit.     Allergies:   Hydrocodone, Hydrocodone-acetaminophen, Morphine and related, Rosuvastatin, Lisinopril, and Nsaids   Social History:  The patient  reports that she quit smoking about 19 years ago. Her smoking use included cigarettes. She has a 1.25 pack-year  smoking history. She has never used smokeless tobacco. She reports that she does not drink alcohol or use drugs.   Family History:  The patient's family history includes Diabetes in her father, maternal grandmother, mother, paternal grandmother, sister, sister, sister, and sister; Heart disease in her father; Hypertension in her sister; Kidney disease in her sister; Pancreatic cancer in her mother.    ROS:  Please see the history of present illness.   Otherwise, review of systems is positive for none.   All other systems are reviewed and negative.    PHYSICAL EXAM: VS:  BP 102/66   Pulse 66   Ht 5' 7.5" (1.715 m)   Wt 247 lb 6.4 oz (112.2 kg)   SpO2 96%   BMI 38.18 kg/m  , BMI Body mass index is 38.18 kg/m. GEN: Well nourished, well developed, in no acute distress  HEENT: normal  Neck: no JVD, carotid bruits, or masses Cardiac: RRR; no murmurs, rubs, or gallops,no edema  Respiratory:  clear to auscultation bilaterally, normal work of breathing GI: soft, nontender, nondistended, + BS MS: no deformity or atrophy  Skin: warm and dry Neuro:  Strength and sensation are intact Psych: euthymic mood, full affect  EKG:  EKG is ordered today. Personal review of the ekg ordered shows SR, rate 66  Recent Labs: 09/01/2018: ALT 16 02/17/2019: B Natriuretic Peptide 46.5 03/09/2019: TSH  3.340 03/13/2019: Hemoglobin 10.8; Platelets 277 03/22/2019: BUN 21; Creatinine, Ser 0.94; Magnesium 2.2; Potassium 3.9; Sodium 140    Lipid Panel     Component Value Date/Time   CHOL 131 03/09/2019 0857   TRIG 77 03/09/2019 0857   HDL 48 03/09/2019 0857   CHOLHDL 2.7 03/09/2019 0857   CHOLHDL 3.1 02/17/2019 0215   VLDL 17 02/17/2019 0215   LDLCALC 68 03/09/2019 0857     Wt Readings from Last 3 Encounters:  06/29/19 247 lb 6.4 oz (112.2 kg)  03/26/19 240 lb (108.9 kg)  03/13/19 243 lb (110.2 kg)      Other studies Reviewed: Additional studies/ records that were reviewed today include: TTE 02/17/19  Review of the above records today demonstrates:   1. The left ventricle has hyperdynamic systolic function, with an ejection fraction of >65%. The cavity size was normal. There is moderately increased left ventricular wall thickness. Left ventricular diastolic Doppler parameters are consistent with  pseudonormalization. Elevated left atrial and left ventricular end-diastolic pressures The E/e' is >15. No evidence of left ventricular regional wall motion abnormalities.  2. The right ventricle has normal systolic function. The cavity was normal. There is no increase in right ventricular wall thickness.  3. The mitral valve is grossly normal.  4. The tricuspid valve is grossly normal.  5. The aortic valve is tricuspid. Mild sclerosis of the aortic valve. Aortic valve regurgitation is mild to moderate by color flow Doppler. mildly increased gradient without stenosis of the aortic valve.  6. The aortic root and ascending aorta are normal in size and structure.  7. The inferior vena cava was dilated in size with <50% respiratory variability.   ASSESSMENT AND PLAN:  1.  Paroxysmal atrial fibrillation: Currently on dofetilide and Eliquis.  Has remained in sinus rhythm.  She does have occasional palpitations that last a few seconds multiple times a week.  We will place a monitor today.  This  patients CHA2DS2-VASc Score and unadjusted Ischemic Stroke Rate (% per year) is equal to 4.8 % stroke rate/year from a score of 4  Above score calculated as  1 point each if present [CHF, HTN, DM, Vascular=MI/PAD/Aortic Plaque, Age if 65-74, or Female] Above score calculated as 2 points each if present [Age > 75, or Stroke/TIA/TE]  2.  Recurrent pericarditis: Followed in cardiology clinic. Per primary cardiology.  3.  Chronic diastolic heart failure: No signs of volume overload.  Current medicines are reviewed at length with the patient today.   The patient does not have concerns regarding her medicines.  The following changes were made today:  none  Labs/ tests ordered today include:  Orders Placed This Encounter  Procedures  . LONG TERM MONITOR (3-14 DAYS)  . EKG 12-Lead     Disposition:   FU with Will Camnitz 6 months  Signed, Will Meredith Leeds, MD  06/29/2019 11:55 AM     [facility] [address] [place] [address] [phone] (office) [phone] (fax)

## 2019-06-30 ENCOUNTER — Encounter: Payer: Self-pay | Admitting: Internal Medicine

## 2019-06-30 ENCOUNTER — Ambulatory Visit (INDEPENDENT_AMBULATORY_CARE_PROVIDER_SITE_OTHER): Payer: Medicare Other

## 2019-06-30 ENCOUNTER — Other Ambulatory Visit: Payer: Self-pay

## 2019-06-30 VITALS — BP 124/62 | HR 69 | Temp 98.8°F | Ht 68.0 in | Wt 247.8 lb

## 2019-06-30 DIAGNOSIS — Z23 Encounter for immunization: Secondary | ICD-10-CM | POA: Diagnosis not present

## 2019-06-30 DIAGNOSIS — Z0000 Encounter for general adult medical examination without abnormal findings: Secondary | ICD-10-CM | POA: Diagnosis not present

## 2019-06-30 LAB — HM DIABETES EYE EXAM

## 2019-06-30 MED ORDER — BOOSTRIX 5-2.5-18.5 LF-MCG/0.5 IM SUSP: 0.5000 mL | Freq: Once | INTRAMUSCULAR | 0 refills | Status: AC

## 2019-06-30 NOTE — Patient Instructions (Signed)
[name] , Thank you for taking time to come for your Medicare Wellness Visit. I appreciate your ongoing commitment to your health goals. Please review the following plan we discussed and let me know if I can assist you in the future.   Screening recommendations/referrals: Colonoscopy: 05/2018 Mammogram: 11/2018 Bone Density: n/a Recommended yearly ophthalmology/optometry visit for glaucoma screening and checkup Recommended yearly dental visit for hygiene and checkup  Vaccinations: Influenza vaccine: 05/2019 Pneumococcal vaccine: 05/2019 Tdap vaccine: sent to pharmacy Shingles vaccine: discussed    Advanced directives: Advance directive discussed with you today. Even though you declined this today please call our office should you change your mind and we can give you the proper paperwork for you to fill out.   Conditions/risks identified: obesity  Next appointment: 09/01/2019 at 3:00  Preventive Care 40-64 Years, Female Preventive care refers to lifestyle choices and visits with your health care provider that can promote health and wellness. What does preventive care include?  A yearly physical exam. This is also called an annual well check.  Dental exams once or twice a year.  Routine eye exams. Ask your health care provider how often you should have your eyes checked.  Personal lifestyle choices, including:  Daily care of your teeth and gums.  Regular physical activity.  Eating a healthy diet.  Avoiding tobacco and drug use.  Limiting alcohol use.  Practicing safe sex.  Taking low-dose aspirin daily starting at age 74.  Taking vitamin and mineral supplements as recommended by your health care provider. What happens during an annual well check? The services and screenings done by your health care provider during your annual well check will depend on your age, overall health, lifestyle risk factors, and family history of disease. Counseling  Your health care provider  may ask you questions about your:  Alcohol use.  Tobacco use.  Drug use.  Emotional well-being.  Home and relationship well-being.  Sexual activity.  Eating habits.  Work and work Statistician.  Method of birth control.  Menstrual cycle.  Pregnancy history. Screening  You may have the following tests or measurements:  Height, weight, and BMI.  Blood pressure.  Lipid and cholesterol levels. These may be checked every 5 years, or more frequently if you are over 72 years old.  Skin check.  Lung cancer screening. You may have this screening every year starting at age 33 if you have a 30-pack-year history of smoking and currently smoke or have quit within the past 15 years.  Fecal occult blood test (FOBT) of the stool. You may have this test every year starting at age 61.  Flexible sigmoidoscopy or colonoscopy. You may have a sigmoidoscopy every 5 years or a colonoscopy every 10 years starting at age 91.  Hepatitis C blood test.  Hepatitis B blood test.  Sexually transmitted disease (STD) testing.  Diabetes screening. This is done by checking your blood sugar (glucose) after you have not eaten for a while (fasting). You may have this done every 1-3 years.  Mammogram. This may be done every 1-2 years. Talk to your health care provider about when you should start having regular mammograms. This may depend on whether you have a family history of breast cancer.  BRCA-related cancer screening. This may be done if you have a family history of breast, ovarian, tubal, or peritoneal cancers.  Pelvic exam and Pap test. This may be done every 3 years starting at age 73. Starting at age 13, this may be done every  5 years if you have a Pap test in combination with an HPV test.  Bone density scan. This is done to screen for osteoporosis. You may have this scan if you are at high risk for osteoporosis. Discuss your test results, treatment options, and if necessary, the need for more  tests with your health care provider. Vaccines  Your health care provider may recommend certain vaccines, such as:  Influenza vaccine. This is recommended every year.  Tetanus, diphtheria, and acellular pertussis (Tdap, Td) vaccine. You may need a Td booster every 10 years.  Zoster vaccine. You may need this after age 46.  Pneumococcal 13-valent conjugate (PCV13) vaccine. You may need this if you have certain conditions and were not previously vaccinated.  Pneumococcal polysaccharide (PPSV23) vaccine. You may need one or two doses if you smoke cigarettes or if you have certain conditions. Talk to your health care provider about which screenings and vaccines you need and how often you need them. This information is not intended to replace advice given to you by your health care provider. Make sure you discuss any questions you have with your health care provider. Document Released: 11/03/2015 Document Revised: 06/26/2016 Document Reviewed: 08/08/2015 Elsevier Interactive Patient Education  2017 [employer] Prevention in the Home Falls can cause injuries. They can happen to people of all ages. There are many things you can do to make your home safe and to help prevent falls. What can I do on the outside of my home?  Regularly fix the edges of walkways and driveways and fix any cracks.  Remove anything that might make you trip as you walk through a door, such as a raised step or threshold.  Trim any bushes or trees on the path to your home.  Use bright outdoor lighting.  Clear any walking paths of anything that might make someone trip, such as rocks or tools.  Regularly check to see if handrails are loose or broken. Make sure that both sides of any steps have handrails.  Any raised decks and porches should have guardrails on the edges.  Have any leaves, snow, or ice cleared regularly.  Use sand or salt on walking paths during winter.  Clean up any spills in your  garage right away. This includes oil or grease spills. What can I do in the bathroom?  Use night lights.  Install grab bars by the toilet and in the tub and shower. Do not use towel bars as grab bars.  Use non-skid mats or decals in the tub or shower.  If you need to sit down in the shower, use a plastic, non-slip stool.  Keep the floor dry. Clean up any water that spills on the floor as soon as it happens.  Remove soap buildup in the tub or shower regularly.  Attach bath mats securely with double-sided non-slip rug tape.  Do not have throw rugs and other things on the floor that can make you trip. What can I do in the bedroom?  Use night lights.  Make sure that you have a light by your bed that is easy to reach.  Do not use any sheets or blankets that are too big for your bed. They should not hang down onto the floor.  Have a firm chair that has side arms. You can use this for support while you get dressed.  Do not have throw rugs and other things on the floor that can make you trip. What can I  do in the kitchen?  Clean up any spills right away.  Avoid walking on wet floors.  Keep items that you use a lot in easy-to-reach places.  If you need to reach something above you, use a strong step stool that has a grab bar.  Keep electrical cords out of the way.  Do not use floor polish or wax that makes floors slippery. If you must use wax, use non-skid floor wax.  Do not have throw rugs and other things on the floor that can make you trip. What can I do with my stairs?  Do not leave any items on the stairs.  Make sure that there are handrails on both sides of the stairs and use them. Fix handrails that are broken or loose. Make sure that handrails are as long as the stairways.  Check any carpeting to make sure that it is firmly attached to the stairs. Fix any carpet that is loose or worn.  Avoid having throw rugs at the top or bottom of the stairs. If you do have throw  rugs, attach them to the floor with carpet tape.  Make sure that you have a light switch at the top of the stairs and the bottom of the stairs. If you do not have them, ask someone to add them for you. What else can I do to help prevent falls?  Wear shoes that:  Do not have high heels.  Have rubber bottoms.  Are comfortable and fit you well.  Are closed at the toe. Do not wear sandals.  If you use a stepladder:  Make sure that it is fully opened. Do not climb a closed stepladder.  Make sure that both sides of the stepladder are locked into place.  Ask someone to hold it for you, if possible.  Clearly mark and make sure that you can see:  Any grab bars or handrails.  First and last steps.  Where the edge of each step is.  Use tools that help you move around (mobility aids) if they are needed. These include:  Canes.  Walkers.  Scooters.  Crutches.  Turn on the lights when you go into a dark area. Replace any light bulbs as soon as they burn out.  Set up your furniture so you have a clear path. Avoid moving your furniture around.  If any of your floors are uneven, fix them.  If there are any pets around you, be aware of where they are.  Review your medicines with your doctor. Some medicines can make you feel dizzy. This can increase your chance of falling. Ask your doctor what other things that you can do to help prevent falls. This information is not intended to replace advice given to you by your health care provider. Make sure you discuss any questions you have with your health care provider. Document Released: 08/03/2009 Document Revised: 03/14/2016 Document Reviewed: 11/11/2014 Elsevier Interactive Patient Education  2017 [employer].

## 2019-06-30 NOTE — Progress Notes (Signed)
Subjective:   [name] is a 63 y.o. female who presents for Medicare Annual (Subsequent) preventive examination.  Review of Systems:  n/a Cardiac Risk Factors include: diabetes mellitus;dyslipidemia;hypertension;sedentary lifestyle;obesity (BMI >30kg/m2)     Objective:     Vitals: BP 124/62 (BP Location: Left Arm, Patient Position: Sitting, Cuff Size: Large)   Pulse 69   Temp 98.8 F (37.1 C) (Oral)   Ht 5\' 8"  (1.727 m)   Wt 247 lb 12.8 oz (112.4 kg)   SpO2 98%   BMI 37.68 kg/m   Body mass index is 37.68 kg/m.  Advanced Directives 06/30/2019 02/17/2019 02/16/2019 09/01/2018 08/26/2018 05/28/2018 12/01/2017  Does Patient Have a Medical Advance Directive? No - No No No No No  Would patient like information on creating a medical advance directive? - No - Patient declined - - No - Patient declined No - Patient declined No - Patient declined    Tobacco Social History   Tobacco Use  Smoking Status Former Smoker  . Packs/day: 0.25  . Years: 5.00  . Pack years: 1.25  . Types: Cigarettes  . Quit date: 07/22/1999  . Years since quitting: 19.9  Smokeless Tobacco Never Used     Counseling given: Not Answered   Clinical Intake:  Pre-visit preparation completed: Yes  Pain : 0-10 Pain Score: 6  Pain Type: Acute pain Pain Location: Eye Pain Orientation: Left Pain Descriptors / Indicators: Aching Pain Onset: More than a month ago Pain Frequency: Constant     Nutritional Status: BMI > 30  Obese Nutritional Risks: None Diabetes: Yes CBG done?: No Did pt. bring in CBG monitor from home?: No  How often do you need to have someone help you when you read instructions, pamphlets, or other written materials from your doctor or pharmacy?: 1 - Never What is the last grade level you completed in school?: 12th grade  Interpreter Needed?: No  Information entered by :: NAllen LPN  Past Medical History:  Diagnosis Date  . Allergy   . Anemia   . Anemia   . Aortic  insufficiency    a. Mod by echo 05/2015.  . Arthritis    "knees" (07/11/2015)  . Atrial fibrillation (HCC)    On Eliquis  . 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 Complication of anesthesia    "I had a hard time waking me up w/one of my shoulder surgeries"  . Coronary artery disease    a. Abnormal stress test -> LHc 06/2015 s/p DES to mCX and mLAD, residual D2 disease treated medially.  . Diverticulitis   . Elevated blood pressure   . Erythema nodosum   . Heart murmur   . Insomnia   . lung ca dx'd 10/1999   chemo/xrt comp 05/29/2000  . Myalgia   . Nephrolithiasis 2015  . Obesity   . Pericardial effusion    a. 05/2015 Echo: EF 55-60%, no rwma, Gr 1 DD, no effusion - but an effusion was seen on CT which was felt to be increased in size compared to 12/2014 - pericardium also thickened.   . Pericarditis    a. Dx 05/2015.  [name] Pneumothorax, spontaneous, tension 01/20/2012   October, 2010  . Pulmonary fibrosis ([place]) 01/20/2012   Due to previous surgery and chest radiation for lung cancer  . Restrictive lung disease   . Type II diabetes mellitus ([place])  Past Surgical History:  Procedure Laterality Date  . ABDOMINAL HYSTERECTOMY  2008  . APPENDECTOMY  1969?  [name] CARDIAC CATHETERIZATION N/A 07/11/2015   Procedure: Left Heart Cath and Coronary Angiography;  Surgeon: Jettie Booze, MD;  Location: [facility];  Service: Cardiovascular;  Laterality: N/A;  . CARDIAC CATHETERIZATION  07/11/2015   Procedure: Coronary Stent Intervention;  Surgeon: Jettie Booze, MD;  Location: [facility];  Service: Cardiovascular;;  . CARPAL TUNNEL RELEASE Bilateral 1998-2005?   right-left  . CARPAL TUNNEL RELEASE Left 2009?   "for the 2nd time"  . COLONOSCOPY    . COLONOSCOPY WITH PROPOFOL N/A 05/28/2018   Procedure: COLONOSCOPY WITH PROPOFOL;  Surgeon: Milus Banister, MD;   Location: [facility];  Service: Endoscopy;  Laterality: N/A;  . CORONARY ANGIOPLASTY    . CYSTOSCOPY W/ STONE MANIPULATION  2013  . ESOPHAGOGASTRODUODENOSCOPY (EGD) WITH ESOPHAGEAL DILATION  2012  . FOOT SURGERY Bilateral 1980's   Callous removed   . INNER EAR SURGERY Right ~ 2009  . KNEE ARTHROPLASTY Right 1991  . KNEE ARTHROSCOPY  ~ 2000   LATERAL RELEASE  . LEFT HEART CATH AND CORONARY ANGIOGRAPHY N/A 12/18/2016   Procedure: Left Heart Cath and Coronary Angiography;  Surgeon: Burnell Blanks, MD;  Location: [facility];  Service: Cardiovascular;  Laterality: N/A;  . LUNG CANCER SURGERY Left 2001   "small cell"  . LUNG SURGERY Right 2007   "reinflated it"  . PULMONARY EMBOLISM SURGERY  2007   LUNG COLLAPSE  . RIGHT AND LEFT HEART CATH N/A 06/25/2017   Procedure: RIGHT AND LEFT HEART CATH;  Surgeon: Sherren Mocha, MD;  Location: [facility];  Service: Cardiovascular;  Laterality: N/A;  . SHOULDER ADHESION RELEASE Right ~ 2004  . SHOULDER ARTHROSCOPY W/ ROTATOR CUFF REPAIR Right ~ 2003   Family History  Problem Relation Age of Onset  . Heart disease Father   . Diabetes Father   . Pancreatic cancer Mother   . Diabetes Mother   . Hypertension Sister        x 3  . Diabetes Sister        x 4  . Diabetes Maternal Grandmother   . Diabetes Paternal Grandmother   . Diabetes Sister   . Diabetes Sister   . Diabetes Sister   . Kidney disease Sister   . Colon cancer Neg Hx   . Colon polyps Neg Hx   . Rectal cancer Neg Hx   . Stomach cancer Neg Hx   . Esophageal cancer Neg Hx   . Breast cancer Neg Hx    Social History   Socioeconomic History  . Marital status: Single    Spouse name: Not on file  . Number of children: 0  . Years of education: Not on file  . Highest education level: Not on file  Occupational History  . Occupation: RETIRED  Social Needs  . Financial resource strain: Not hard at all  . Food insecurity    Worry: Never true    Inability:  Never true  . Transportation needs    Medical: No    Non-medical: No  Tobacco Use  . Smoking status: Former Smoker    Packs/day: 0.25    Years: 5.00    Pack years: 1.25    Types: Cigarettes    Quit date: 07/22/1999    Years since quitting: 19.9  . Smokeless tobacco: Never Used  Substance and Sexual Activity  . Alcohol  use: No    Alcohol/week: 0.0 standard drinks  . Drug use: No  . Sexual activity: Not Currently    Birth control/protection: Other-see comments    Comment: hysterectomy  Lifestyle  . Physical activity    Days per week: 0 days    Minutes per session: 0 min  . Stress: Not at all  Relationships  . Social Herbalist on phone: Not on file    Gets together: Not on file    Attends religious service: Not on file    Active member of club or organization: Not on file    Attends meetings of clubs or organizations: Not on file    Relationship status: Not on file  Other Topics Concern  . Not on file  Social History Narrative  . Not on file    Outpatient Encounter Medications as of 06/30/2019  Medication Sig  . B-D UF III MINI PEN NEEDLES 31G X 5 MM MISC 2 (two) times daily. as directed  . co-enzyme Q-10 50 MG capsule TAKE 1 CAPSULE BY MOUTH EVERY DAY  . diltiazem (CARDIZEM CD) 360 MG 24 hr capsule Take 1 capsule (360 mg total) by mouth daily.  [name] dofetilide (TIKOSYN) 500 MCG capsule Take 1 capsule (500 mcg total) by mouth 2 (two) times daily.  [name] ELIQUIS 5 MG TABS tablet TAKE 1 TABLET BY MOUTH TWICE A DAY  . furosemide (LASIX) 40 MG tablet Take 40 mg by mouth as needed.  [name] HUMALOG MIX 75/25 KWIKPEN (75-25) 100 UNIT/ML Kwikpen Inject 30-40 Units into the skin See admin instructions. Inject 40 units SQ in Am and 35 units SQ in PM  . levothyroxine (SYNTHROID, LEVOTHROID) 50 MCG tablet Take 50 mcg by mouth daily before breakfast.   . losartan (COZAAR) 25 MG tablet TAKE 1 TABLET (25 MG TOTAL) BY MOUTH DAILY.  . metoprolol tartrate (LOPRESSOR) 25 MG tablet TAKE 1  TABLET (25 MG TOTAL) BY MOUTH 2 (TWO) TIMES DAILY.  . nitroGLYCERIN (NITROSTAT) 0.4 MG SL tablet Place 1 tablet (0.4 mg total) under the tongue every 5 (five) minutes as needed for chest pain.  . ONE TOUCH ULTRA TEST test strip USE AS DIRECTED 3 TIMES A DAY  . OZEMPIC, 0.25 OR 0.5 MG/DOSE, 2 MG/1.5ML SOPN INJECT 0.5 MG INTO THE SKIN ONCE A WEEK.  . pravastatin (PRAVACHOL) 20 MG tablet TAKE 1 TABLET BY MOUTH EVERY DAY IN THE EVENING  . spironolactone (ALDACTONE) 25 MG tablet TAKE 1 TABLET BY MOUTH EVERY DAY  . tiZANidine (ZANAFLEX) 4 MG tablet Take 4 mg by mouth every 8 (eight) hours as needed for muscle spasms.  . Tdap (BOOSTRIX) 5-2.5-18.5 LF-MCG/0.5 injection Inject 0.5 mLs into the muscle once for 1 dose.   No facility-administered encounter medications on file as of 06/30/2019.     Activities of Daily Living In your present state of health, do you have any difficulty performing the following activities: 06/30/2019 02/17/2019  Hearing? Y N  Comment surgery of right ear -  Vision? N N  Difficulty concentrating or making decisions? N N  Walking or climbing stairs? Y N  Comment - -  Dressing or bathing? N N  Doing errands, shopping? N Y  Conservation officer, nature and eating ? N -  Using the Toilet? N -  In the past six months, have you accidently leaked urine? N -  Comment - -  Do you have problems with loss of bowel control? N -  Managing your Medications? N -  Managing your Finances? N -  Housekeeping or managing your Housekeeping? N -  Some recent data might be hidden    Patient Care Team: Glendale Chard, MD as PCP - General (Internal Medicine) Dorothy Spark, MD as PCP - Cardiology (Cardiology) Constance Haw, MD as PCP - Electrophysiology (Cardiology) Fay Records, MD (Cardiology) Melida Quitter, MD Milus Banister, MD (Gastroenterology) Dorna Leitz, MD (Orthopedic Surgery) Annia Belt, MD (Hematology and Oncology) Nicanor Alcon, MD (Thoracic Surgery)  Truddie Crumble, MD (Radiation Oncology) Clent Jacks, MD as Consulting Physician (Ophthalmology) Jacelyn Pi, MD as Consulting Physician (Endocrinology)    Assessment:   This is a routine wellness examination for [place].  Exercise Activities and Dietary recommendations Current Exercise Habits: The patient does not participate in regular exercise at present  Goals    . DIET - INCREASE WATER INTAKE (pt-stated)    . Weight (lb) < 200 lb (90.7 kg) (pt-stated)     Wants to lose 20 pounds    . Weight (lb) < 200 lb (90.7 kg)     06/30/2019, wants get to 160 pounds       Fall Risk Fall Risk  06/30/2019 08/26/2018 11/23/2015 04/18/2015 04/10/2015  Falls in the past year? 0 0 No Yes No  Number falls in past yr: - - - 1 -  Risk for fall due to : Medication side effect Medication side effect - - -  Follow up Falls evaluation completed;Education provided;Falls prevention discussed - - - -   Is the patient's home free of loose throw rugs in walkways, pet beds, electrical cords, etc?   yes      Grab bars in the bathroom? yes      Handrails on the stairs?   n/a      Adequate lighting?   yes  Timed Get Up and Go performed: n/a  Depression Screen PHQ 2/9 Scores 06/30/2019 09/23/2018 08/26/2018 11/23/2015  PHQ - 2 Score 0 0 0 0  PHQ- 9 Score 3 - - -     Cognitive Function     6CIT Screen 06/30/2019 08/26/2018  What Year? 0 points 0 points  What month? 0 points 0 points  What time? 0 points 0 points  Count back from 20 0 points 0 points  Months in reverse 0 points 0 points  Repeat phrase 2 points 0 points  Total Score 2 0    Immunization History  Administered Date(s) Administered  . Influenza Whole 07/21/2009, 07/29/2010  . Influenza,inj,Quad PF,6+ Mos 11/23/2015  . Influenza-Unspecified 08/14/2016, 06/16/2019  . PPD Test 06/19/2013  . Pneumococcal Conjugate-13 04/18/2015  . Pneumococcal Polysaccharide-23 07/29/2010, 06/16/2019    Qualifies for Shingles Vaccine? yes  Screening  Tests Health Maintenance  Topic Date Due  . TETANUS/TDAP  07/22/1977  . PAP SMEAR-Modifier  10/22/2003  . FOOT EXAM  11/22/2016  . OPHTHALMOLOGY EXAM  06/19/2018  . HEMOGLOBIN A1C  08/19/2019  . MAMMOGRAM  12/02/2020  . COLONOSCOPY  05/28/2028  . INFLUENZA VACCINE  Completed  . PNEUMOCOCCAL POLYSACCHARIDE VACCINE AGE 50-64 HIGH RISK  Completed  . Hepatitis C Screening  Completed  . HIV Screening  Completed    Cancer Screenings: Lung: Low Dose CT Chest recommended if Age 77-80 years, 30 pack-year currently smoking OR have quit w/in 15years. Patient does not qualify. Breast:  Up to date on Mammogram? Yes   Up to date of Bone Density/Dexa? n/a Colorectal: up to date  Additional Screenings: : Hepatitis C Screening: 06/2015  Plan:    Patient wants to get to 160 pounds.   I have personally reviewed and noted the following in the patient's chart:   . Medical and social history . Use of alcohol, tobacco or illicit drugs  . Current medications and supplements . Functional ability and status . Nutritional status . Physical activity . Advanced directives . List of other physicians . Hospitalizations, surgeries, and ER visits in previous 12 months . Vitals . Screenings to include cognitive, depression, and falls . Referrals and appointments  In addition, I have reviewed and discussed with patient certain preventive protocols, quality metrics, and best practice recommendations. A written personalized care plan for preventive services as well as general preventive health recommendations were provided to patient.     Kellie Simmering, LPN  3/0/0923

## 2019-07-01 ENCOUNTER — Encounter: Payer: Self-pay | Admitting: Internal Medicine

## 2019-07-05 ENCOUNTER — Telehealth: Payer: Self-pay | Admitting: Cardiology

## 2019-07-05 NOTE — Telephone Encounter (Signed)
Review instructions again for the 14 day [employer] long term holter monitor being shipped to her home.

## 2019-07-05 NOTE — Telephone Encounter (Signed)
New message:  Patient returned a call from [facility] in regards to a monitor. The patient states she did not get the whole message, and wanted to clarify the exact instructions. Please call to discuss further

## 2019-07-10 ENCOUNTER — Ambulatory Visit (INDEPENDENT_AMBULATORY_CARE_PROVIDER_SITE_OTHER): Payer: Medicare Other

## 2019-07-10 DIAGNOSIS — I480 Paroxysmal atrial fibrillation: Secondary | ICD-10-CM

## 2019-07-10 DIAGNOSIS — R002 Palpitations: Secondary | ICD-10-CM

## 2019-07-18 ENCOUNTER — Other Ambulatory Visit: Payer: Self-pay | Admitting: Cardiology

## 2019-07-21 ENCOUNTER — Other Ambulatory Visit: Payer: Self-pay | Admitting: Cardiology

## 2019-07-26 ENCOUNTER — Other Ambulatory Visit: Payer: Self-pay | Admitting: Cardiology

## 2019-07-26 DIAGNOSIS — I308 Other forms of acute pericarditis: Secondary | ICD-10-CM

## 2019-07-26 NOTE — Telephone Encounter (Signed)
Outpatient Medication Detail   Disp Refills Start End   diltiazem (CARDIZEM CD) 360 MG 24 hr capsule 90 capsule 2 07/21/2019    Sig: TAKE 1 CAPSULE BY MOUTH EVERY DAY   Sent to pharmacy as: diltiazem (CARDIZEM CD) 360 MG 24 hr capsule   E-Prescribing Status: Receipt confirmed by pharmacy (07/21/2019 2:40 PM EDT)   Pharmacy  [id number] - [place], [place] 

## 2019-07-26 NOTE — Telephone Encounter (Signed)
Eliquis 5mg  refill request received; pt is 63yrs old, weight-112.4kg, Crea- 0.94 on 03/22/2019, Diagnosis-Afib, and last seen by Dr. Curt Bears on 06/29/2019 and is a Dr. [name] pt last seen on 10/28/2018. Dose is appropriate based on dosing criteria. Will send in refill to requested pharmacy.

## 2019-08-27 ENCOUNTER — Other Ambulatory Visit: Payer: Self-pay | Admitting: Cardiology

## 2019-08-27 DIAGNOSIS — I308 Other forms of acute pericarditis: Secondary | ICD-10-CM

## 2019-08-31 DIAGNOSIS — H35033 Hypertensive retinopathy, bilateral: Secondary | ICD-10-CM | POA: Insufficient documentation

## 2019-08-31 DIAGNOSIS — H2513 Age-related nuclear cataract, bilateral: Secondary | ICD-10-CM | POA: Insufficient documentation

## 2019-08-31 DIAGNOSIS — H15002 Unspecified scleritis, left eye: Secondary | ICD-10-CM | POA: Insufficient documentation

## 2019-09-01 ENCOUNTER — Ambulatory Visit: Payer: Medicare Other | Admitting: Internal Medicine

## 2019-09-01 ENCOUNTER — Other Ambulatory Visit: Payer: Self-pay

## 2019-09-01 ENCOUNTER — Ambulatory Visit: Payer: Medicare Other

## 2019-09-01 ENCOUNTER — Encounter: Payer: Self-pay | Admitting: Internal Medicine

## 2019-09-01 VITALS — BP 120/80 | HR 70 | Temp 98.9°F | Ht 65.8 in | Wt 248.4 lb

## 2019-09-01 DIAGNOSIS — I1310 Hypertensive heart and chronic kidney disease without heart failure, with stage 1 through stage 4 chronic kidney disease, or unspecified chronic kidney disease: Secondary | ICD-10-CM

## 2019-09-01 DIAGNOSIS — Z794 Long term (current) use of insulin: Secondary | ICD-10-CM

## 2019-09-01 DIAGNOSIS — E1122 Type 2 diabetes mellitus with diabetic chronic kidney disease: Secondary | ICD-10-CM | POA: Diagnosis not present

## 2019-09-01 DIAGNOSIS — Z0000 Encounter for general adult medical examination without abnormal findings: Secondary | ICD-10-CM

## 2019-09-01 DIAGNOSIS — N183 Chronic kidney disease, stage 3 unspecified: Secondary | ICD-10-CM

## 2019-09-01 DIAGNOSIS — I482 Chronic atrial fibrillation, unspecified: Secondary | ICD-10-CM

## 2019-09-01 DIAGNOSIS — Z6841 Body Mass Index (BMI) 40.0 and over, adult: Secondary | ICD-10-CM

## 2019-09-01 LAB — HEMOGLOBIN A1C
Est. average glucose Bld gHb Est-mCnc: 137 mg/dL
Hgb A1c MFr Bld: 6.4 % — ABNORMAL HIGH (ref 4.8–5.6)

## 2019-09-01 NOTE — Progress Notes (Signed)
Subjective:     Patient ID: [name] , female    DOB: [birth year] , 63 y.o.   MRN: [id number]   Chief Complaint  Patient presents with  . Annual Exam  . Diabetes  . Hypertension    HPI  She is here today for a full physical examination. She is no longer followed by GYN. She reports having a hysterectomy.   Diabetes She presents for her follow-up diabetic visit. She has type 2 diabetes mellitus. Her disease course has been stable. There are no hypoglycemic associated symptoms. Pertinent negatives for diabetes include no blurred vision and no chest pain. There are no hypoglycemic complications. Symptoms are stable. Diabetic complications include nephropathy. Risk factors for coronary artery disease include diabetes mellitus, dyslipidemia, hypertension, sedentary lifestyle, post-menopausal and obesity. She is following a diabetic diet. When asked about meal planning, she reported none. She participates in exercise intermittently. Eye exam is current.  Hypertension This is a chronic problem. The current episode started more than 1 year ago. The problem has been gradually improving since onset. The problem is controlled. Pertinent negatives include no blurred vision, chest pain, palpitations or shortness of breath. Risk factors for coronary artery disease include diabetes mellitus, obesity, dyslipidemia, sedentary lifestyle and post-menopausal state. The current treatment provides moderate improvement. Compliance problems include exercise.  Hypertensive end-organ damage includes kidney disease.     Past Medical History:  Diagnosis Date  . Allergy   . Anemia   . Anemia   . Aortic insufficiency    a. Mod by echo 05/2015.  . Arthritis    "knees" (07/11/2015)  . Atrial fibrillation (HCC)    On Eliquis  . 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 Complication of anesthesia     "I had a hard time waking me up w/one of my shoulder surgeries"  . Coronary artery disease    a. Abnormal stress test -> [facility]c 06/2015 s/p DES to mCX and mLAD, residual D2 disease treated medially.  . Diverticulitis   . Elevated blood pressure   . Erythema nodosum   . Heart murmur   . Insomnia   . lung ca dx'd 10/1999   chemo/xrt comp 05/29/2000  . Myalgia   . Nephrolithiasis 2015  . Obesity   . Pericardial effusion    a. 05/2015 Echo: EF 55-60%, no rwma, Gr 1 DD, no effusion - but an effusion was seen on CT which was felt to be increased in size compared to 12/2014 - pericardium also thickened.   . Pericarditis    a. Dx 05/2015.  [name] Pneumothorax, spontaneous, tension 01/20/2012   October, 2010  . Pulmonary fibrosis ([place]) 01/20/2012   Due to previous surgery and chest radiation for lung cancer  . Restrictive lung disease   . Type II diabetes mellitus (HCC)      Family History  Problem Relation Age of Onset  . Heart disease Father   . Diabetes Father   . Pancreatic cancer Mother   . Diabetes Mother   . Hypertension Sister        x 3  . Diabetes Sister        x 4  . Diabetes Maternal Grandmother   . Diabetes Paternal Grandmother   . Diabetes Sister   . Diabetes Sister   . Diabetes Sister   . Kidney disease Sister   . Colon cancer Neg Hx   .  Colon polyps Neg Hx   . Rectal cancer Neg Hx   . Stomach cancer Neg Hx   . Esophageal cancer Neg Hx   . Breast cancer Neg Hx      Current Outpatient Medications:  .  B-D UF III MINI PEN NEEDLES 31G X 5 MM MISC, 2 (two) times daily. as directed, Disp: , Rfl:  .  co-enzyme Q-10 50 MG capsule, TAKE 1 CAPSULE BY MOUTH EVERY DAY, Disp: 30 capsule, Rfl: 7 .  diltiazem (CARDIZEM CD) 360 MG 24 hr capsule, TAKE 1 CAPSULE BY MOUTH EVERY DAY, Disp: 90 capsule, Rfl: 2 .  dofetilide (TIKOSYN) 500 MCG capsule, Take 1 capsule (500 mcg total) by mouth 2 (two) times daily., Disp: 180 capsule, Rfl: 3 .  ELIQUIS 5 MG TABS tablet, TAKE 1 TABLET BY MOUTH  TWICE A DAY, Disp: 180 tablet, Rfl: 2 .  furosemide (LASIX) 40 MG tablet, Take 40 mg by mouth as needed., Disp: , Rfl:  .  HUMALOG MIX 75/25 KWIKPEN (75-25) 100 UNIT/ML Kwikpen, Inject 30-40 Units into the skin See admin instructions. Inject 40 units SQ in Am and 35 units SQ in PM, Disp: , Rfl: 6 .  levothyroxine (SYNTHROID, LEVOTHROID) 50 MCG tablet, Take 50 mcg by mouth daily before breakfast. , Disp: , Rfl: 6 .  losartan (COZAAR) 25 MG tablet, TAKE 1 TABLET (25 MG TOTAL) BY MOUTH DAILY., Disp: 90 tablet, Rfl: 2 .  metoprolol tartrate (LOPRESSOR) 25 MG tablet, TAKE 1 TABLET (25 MG TOTAL) BY MOUTH 2 (TWO) TIMES DAILY., Disp: 180 tablet, Rfl: 2 .  nitroGLYCERIN (NITROSTAT) 0.4 MG SL tablet, Place 1 tablet (0.4 mg total) under the tongue every 5 (five) minutes as needed for chest pain., Disp: 25 tablet, Rfl: 3 .  ONE TOUCH ULTRA TEST test strip, USE AS DIRECTED 3 TIMES A DAY, Disp: , Rfl:  .  OZEMPIC, 0.25 OR 0.5 MG/DOSE, 2 MG/1.5ML SOPN, INJECT 0.5 MG INTO THE SKIN ONCE A WEEK., Disp: 3 pen, Rfl: 3 .  pravastatin (PRAVACHOL) 20 MG tablet, TAKE 1 TABLET BY MOUTH EVERY DAY IN THE EVENING, Disp: 90 tablet, Rfl: 3 .  spironolactone (ALDACTONE) 25 MG tablet, TAKE 1 TABLET BY MOUTH EVERY DAY, Disp: 90 tablet, Rfl: 3 .  tiZANidine (ZANAFLEX) 4 MG tablet, Take 4 mg by mouth every 8 (eight) hours as needed for muscle spasms., Disp: , Rfl:    Allergies  Allergen Reactions  . Hydrocodone Hives and Itching  . Hydrocodone-Acetaminophen Hives  . Morphine And Related Other (See Comments)    GI PROBLEMS  . Rosuvastatin Other (See Comments)    Pt reports causes lower extremity muscle aches/cramping  . Lisinopril Cough  . Nsaids Other (See Comments)    GI PROBLEMS      The patient states she uses post menopausal status for birth control. Last LMP was No LMP recorded. Patient has had a hysterectomy.. Negative for Dysmenorrhea Negative for: breast discharge, breast lump(s), breast pain and breast self exam.  Associated symptoms include abnormal vaginal bleeding. Pertinent negatives include abnormal bleeding (hematology), anxiety, decreased libido, depression, difficulty falling sleep, dyspareunia, history of infertility, nocturia, sexual dysfunction, sleep disturbances, urinary incontinence, urinary urgency, vaginal discharge and vaginal itching. Diet regular.The patient states her exercise level is  intermittent.  . The patient's tobacco use is:  Social History   Tobacco Use  Smoking Status Former Smoker  . Packs/day: 0.25  . Years: 5.00  . Pack years: 1.25  . Types: Cigarettes  . Quit date:  07/22/1999  . Years since quitting: 20.1  Smokeless Tobacco Never Used  . She has been exposed to passive smoke. The patient's alcohol use is:  Social History   Substance and Sexual Activity  Alcohol Use No  . Alcohol/week: 0.0 standard drinks    Review of Systems  Constitutional: Negative.   HENT: Negative.   Eyes: Negative.  Negative for blurred vision.  Respiratory: Negative.  Negative for shortness of breath.   Cardiovascular: Negative.  Negative for chest pain and palpitations.  Endocrine: Negative.   Genitourinary: Negative.   Musculoskeletal: Negative.   Skin: Negative.   Allergic/Immunologic: Negative.   Neurological: Negative.   Hematological: Negative.   Psychiatric/Behavioral: Negative.      Today's Vitals   09/01/19 1508  BP: 120/80  Pulse: 70  Temp: 98.9 F (37.2 C)  TempSrc: Oral  Weight: 248 lb 6.4 oz (112.7 kg)  Height: 5' 5.8" (1.671 m)   Body mass index is 40.34 kg/m.   Objective:  Physical Exam Vitals signs and nursing note reviewed.  Constitutional:      Appearance: Normal appearance. She is obese.  HENT:     Head: Normocephalic and atraumatic.     Right Ear: Tympanic membrane, ear canal and external ear normal.     Left Ear: Tympanic membrane, ear canal and external ear normal.     Nose: Nose normal.     Mouth/Throat:     Mouth: Mucous membranes are  moist.     Pharynx: Oropharynx is clear.  Eyes:     Extraocular Movements: Extraocular movements intact.     Conjunctiva/sclera: Conjunctivae normal.     Pupils: Pupils are equal, round, and reactive to light.  Neck:     Musculoskeletal: Normal range of motion and neck supple.  Cardiovascular:     Rate and Rhythm: Normal rate and regular rhythm.     Pulses: Normal pulses.          Dorsalis pedis pulses are 2+ on the right side and 2+ on the left side.     Heart sounds: Normal heart sounds.  Pulmonary:     Effort: Pulmonary effort is normal.     Breath sounds: Normal breath sounds.  Chest:     Breasts: Tanner Score is 5.        Right: Normal.        Left: Normal.  Abdominal:     General: Abdomen is protuberant. Bowel sounds are normal.     Palpations: Abdomen is soft.     Tenderness: There is no abdominal tenderness.  Genitourinary:    Comments: deferred Musculoskeletal: Normal range of motion.  Feet:     Right foot:     Protective Sensation: 5 sites tested. 5 sites sensed.     Skin integrity: Skin integrity normal.     Toenail Condition: Right toenails are normal.     Left foot:     Protective Sensation: 5 sites tested. 5 sites sensed.     Skin integrity: Skin integrity normal.     Toenail Condition: Left toenails are normal.  Skin:    General: Skin is warm and dry.  Neurological:     General: No focal deficit present.     Mental Status: She is alert and oriented to person, place, and time.  Psychiatric:        Mood and Affect: Mood normal.        Behavior: Behavior normal.         Assessment And Plan:  1. Encounter for annual physical exam  A full exam was performed.  Importance of monthly self breast exams was discussed with the patient. She declined pelvic exam today.  PATIENT HAS BEEN ADVISED TO GET 30-45 MINUTES REGULAR EXERCISE NO LESS THAN FOUR TO FIVE DAYS PER WEEK - BOTH WEIGHTBEARING EXERCISES AND AEROBIC ARE RECOMMENDED.  SHE WAS ADVISED TO FOLLOW A  HEALTHY DIET WITH AT LEAST SIX FRUITS/VEGGIES PER DAY, DECREASE INTAKE OF RED MEAT, AND TO INCREASE FISH INTAKE TO TWO DAYS PER WEEK.  MEATS/FISH SHOULD NOT BE FRIED, BAKED OR BROILED IS PREFERABLE.  I SUGGEST WEARING SPF 50 SUNSCREEN ON EXPOSED PARTS AND ESPECIALLY WHEN IN THE DIRECT SUNLIGHT FOR AN EXTENDED PERIOD OF TIME.  PLEASE AVOID FAST FOOD RESTAURANTS AND INCREASE YOUR WATER INTAKE.  2. Type 2 diabetes mellitus with stage 3 chronic kidney disease, with long-term current use of insulin, unspecified whether stage 3a or 3b CKD ([place])  Diabetic foot exam was performed.  I DISCUSSED WITH THE PATIENT AT LENGTH REGARDING THE GOALS OF GLYCEMIC CONTROL AND POSSIBLE LONG-TERM COMPLICATIONS.  I  ALSO STRESSED THE IMPORTANCE OF COMPLIANCE WITH HOME GLUCOSE MONITORING, DIETARY RESTRICTIONS INCLUDING AVOIDANCE OF SUGARY DRINKS/PROCESSED FOODS,  ALONG WITH REGULAR EXERCISE.  I  ALSO STRESSED THE IMPORTANCE OF ANNUAL EYE EXAMS, SELF FOOT CARE AND COMPLIANCE WITH OFFICE VISITS.  - Hemoglobin A1c  3. Hypertensive heart and renal disease with renal failure, stage 1 through stage 4 or unspecified chronic kidney disease, without heart failure  Chronic, well controlled. She will continue with current meds. She is encouraged to avoid adding salt to her foods. EKG performed, NSR w/o acute abnormalities. She will rto in six months for re-evaluation.   - EKG 12-Lead  4. Chronic atrial fibrillation (HCC)  Chronic, intermittent. She is currently in normal sinus rhythm.   5. Class 3 severe obesity due to excess calories with serious comorbidity and body mass index (BMI) of 40.0 to 44.9 in adult [facility])  Importance of achieving optimal weight to decrease risk of cardiovascular disease and cancers was discussed with the patient in full detail. She is encouraged to start slowly - start with 10 minutes twice daily at least three to four days per week and to gradually build to 30 minutes five days weekly. She was given tips  to incorporate more activity into her daily routine - take stairs when possible, park farther away from grocery stores, etc.    [name], MD    THE PATIENT IS ENCOURAGED TO PRACTICE SOCIAL DISTANCING DUE TO THE COVID-19 PANDEMIC.

## 2019-09-01 NOTE — Patient Instructions (Signed)
Health Maintenance, Female Adopting a healthy lifestyle and getting preventive care are important in promoting health and wellness. Ask your health care provider about:  The right schedule for you to have regular tests and exams.  Things you can do on your own to prevent diseases and keep yourself healthy. What should I know about diet, weight, and exercise? Eat a healthy diet   Eat a diet that includes plenty of vegetables, fruits, low-fat dairy products, and lean protein.  Do not eat a lot of foods that are high in solid fats, added sugars, or sodium. Maintain a healthy weight Body mass index (BMI) is used to identify weight problems. It estimates body fat based on height and weight. Your health care provider can help determine your BMI and help you achieve or maintain a healthy weight. Get regular exercise Get regular exercise. This is one of the most important things you can do for your health. Most adults should:  Exercise for at least 150 minutes each week. The exercise should increase your heart rate and make you sweat (moderate-intensity exercise).  Do strengthening exercises at least twice a week. This is in addition to the moderate-intensity exercise.  Spend less time sitting. Even light physical activity can be beneficial. Watch cholesterol and blood lipids Have your blood tested for lipids and cholesterol at 61 years of age, then have this test every 5 years. Have your cholesterol levels checked more often if:  Your lipid or cholesterol levels are high.  You are older than 61 years of age.  You are at high risk for heart disease. What should I know about cancer screening? Depending on your health history and family history, you may need to have cancer screening at various ages. This may include screening for:  Breast cancer.  Cervical cancer.  Colorectal cancer.  Skin cancer.  Lung cancer. What should I know about heart disease, diabetes, and high blood  pressure? Blood pressure and heart disease  High blood pressure causes heart disease and increases the risk of stroke. This is more likely to develop in people who have high blood pressure readings, are of African descent, or are overweight.  Have your blood pressure checked: ? Every 3-5 years if you are 61-39 years of age. ? Every year if you are 61 years old or older. Diabetes Have regular diabetes screenings. This checks your fasting blood sugar level. Have the screening done:  Once every three years after age 61 if you are at a normal weight and have a low risk for diabetes.  More often and at a younger age if you are overweight or have a high risk for diabetes. What should I know about preventing infection? Hepatitis B If you have a higher risk for hepatitis B, you should be screened for this virus. Talk with your health care provider to find out if you are at risk for hepatitis B infection. Hepatitis C Testing is recommended for:  Everyone born from [birth year] through 1963.  Anyone with known risk factors for hepatitis C. Sexually transmitted infections (STIs)  Get screened for STIs, including gonorrhea and chlamydia, if: ? You are sexually active and are younger than 61 years of age. ? You are older than 61 years of age and your health care provider tells you that you are at risk for this type of infection. ? Your sexual activity has changed since you were last screened, and you are at increased risk for chlamydia or gonorrhea. Ask your health care provider if  you are at risk.  Ask your health care provider about whether you are at high risk for HIV. Your health care provider may recommend a prescription medicine to help prevent HIV infection. If you choose to take medicine to prevent HIV, you should first get tested for HIV. You should then be tested every 3 months for as long as you are taking the medicine. Pregnancy  If you are about to stop having your period (premenopausal) and  you may become pregnant, seek counseling before you get pregnant.  Take 400 to 800 micrograms (mcg) of folic acid every day if you become pregnant.  Ask for birth control (contraception) if you want to prevent pregnancy. Osteoporosis and menopause Osteoporosis is a disease in which the bones lose minerals and strength with aging. This can result in bone fractures. If you are 61 years old or older, or if you are 61 years old or older, or if you are at risk for osteoporosis and fractures, ask your health care provider if you should:  Be screened for bone loss.  Take a calcium or vitamin D supplement to lower your risk of fractures.  Be given hormone replacement therapy (HRT) to treat symptoms of menopause. Follow these instructions at home: Lifestyle  Do not use any products that contain nicotine or tobacco, such as cigarettes, e-cigarettes, and chewing tobacco. If you need help quitting, ask your health care provider.  Do not use street drugs.  Do not share needles.  Ask your health care provider for help if you need support or information about quitting drugs. Alcohol use  Do not drink alcohol if: ? Your health care provider tells you not to drink. ? You are pregnant, may be pregnant, or are planning to become pregnant.  If you drink alcohol: ? Limit how much you use to 0-1 drink a day. ? Limit intake if you are breastfeeding.  Be aware of how much alcohol is in your drink. In the [place] one drink equals one 12 oz bottle of beer (355 mL), one 5 oz glass of wine (148 mL), or one 1 oz glass of hard liquor (44 mL). General instructions  Schedule regular health, dental, and eye exams.  Stay current with your vaccines.  Tell your health care provider if: ? You often feel depressed. ? You have ever been abused or do not feel safe at home. Summary  Adopting a healthy lifestyle and getting preventive care are important in promoting health and wellness.  Follow your health care provider's instructions about healthy  diet, exercising, and getting tested or screened for diseases.  Follow your health care provider's instructions on monitoring your cholesterol and blood pressure. This information is not intended to replace advice given to you by your health care provider. Make sure you discuss any questions you have with your health care provider. Document Released: 04/22/2011 Document Revised: 09/30/2018 Document Reviewed: 09/30/2018 Elsevier Patient Education  2020 [place].   Scleritis and Episcleritis Scleritis and episcleritis are redness and inflammation of the white part of the eye (sclera). Both of these conditions may affect one or both eyes.  Episcleritis involves only the surface of the sclera. Episcleritis is a mild condition that does not affect your vision. It usually clears up in a few days without treatment.  Scleritis involves the body of the sclera. It is a serious condition that may cause severe pain and some vision loss. Scleritis often needs to be treated with strong medicines. These two conditions may occur along with other diseases that cause inflammation, including diseases in  which your body's defense system (immune system) mistakenly attacks normal body tissues (autoimmune diseases). Rheumatoid arthritis is one common autoimmune disease that may cause episcleritis or scleritis. Other diseases that may cause these conditions include Crohn's disease, lupus, and inflammatory bowel disease. What are the causes? Scleritis is usually caused by:  An autoimmune disease.  Infection. Episcleritis may be caused by:  Eye infections.  Eye irritations. In some cases, the cause of these conditions is not known. What increases the risk? You are more likely to develop these conditions if you:  Are a woman.  Are 71-32 years old.  Have an autoimmune disease, especially rheumatoid arthritis. What are the signs or symptoms? Symptoms of episcleritis include:  Redness of the sclera.  This may go away after several days.  Mild pain. Symptoms of scleritis include:  Redness of the sclera.  Moderate to severe pain.  Pain that gets much worse with light exposure (photophobia).  Tearing.  Soreness when touched (tenderness).  Vision problems. This can range from mild blurred vision to severe vision loss. How is this diagnosed? These conditions may be diagnosed based on your symptoms, your medical history, and a physical exam. You may need to see a health care provider who specializes in diseases and conditions of the eye (ophthalmologist). An ophthalmologist may:  Put drops in your eye.  Examine your eye with a certain type of light (slit lamp).  Order blood tests or refer you to a specialist to check for autoimmune disease. How is this treated? Episcleritis often gets better without treatment after several days. If treatment is needed, it may include:  Eye drops.  Cold compresses.  NSAIDs, such as ibuprofen, to reduce discomfort and swelling. Scleritis is a serious condition that needs to be treated. Treatment may include:  Eye drops.  NSAIDs.  Prescription pain medicines.  Steroid medicines.  Medicines that suppress the immune system.  Treatment of an underlying autoimmune disease.  Eye surgery. This is rare. Follow these instructions at home: Medicines  Use eye drops only as told by your health care provider.  Take over-the-counter and prescription medicines only as told by your health care provider. General instructions  If you usually wear contact lenses, do not wear them until your health care provider says that you can.  If directed, use cold compresses over your eye or eyes to help relieve pain.  Return to your normal activities as told by your health care provider. Ask your health care provider what activities are safe for you.  Keep all follow-up visits as told by your health care provider. This is important. Contact a health care  provider if:  Your symptoms have not gotten better in the expected amount of time. This can take days to weeks.  You develop new symptoms.  You develop worsening pain, photophobia, or blurred vision.  Your symptoms come back after treatment. Get help right away if you have:  Severe pain or severe photophobia.  Sudden vision loss. Summary  Scleritis and episcleritis are redness and inflammation of the white part of the eye (sclera).  Episcleritis involves the surface of the sclera while scleritis involves the entire sclera.  These two conditions may occur along with other diseases that cause inflammation.  The conditions are treated with eye drops, cold compresses, and oral medicines to relieve the inflammation. This information is not intended to replace advice given to you by your health care provider. Make sure you discuss any questions you have with your health care provider. Document  Released: 10/01/2001 Document Revised: 01/29/2019 Document Reviewed: 05/25/2018 Elsevier Patient Education  2020 [employer].

## 2019-09-18 ENCOUNTER — Other Ambulatory Visit: Payer: Self-pay | Admitting: Cardiology

## 2019-09-24 ENCOUNTER — Encounter: Payer: Self-pay | Admitting: Internal Medicine

## 2019-10-19 ENCOUNTER — Telehealth: Payer: Self-pay

## 2019-10-19 NOTE — Telephone Encounter (Signed)
The pt was asked if she is willing to take a cholesterol med once weekly and the pt said yes.  The pt was told that she has pravastatin 20 mg. The pt said yes her heart doctor put her on the pravastatin.

## 2019-10-26 DIAGNOSIS — Z79899 Other long term (current) drug therapy: Secondary | ICD-10-CM | POA: Diagnosis not present

## 2019-10-26 DIAGNOSIS — H15102 Unspecified episcleritis, left eye: Secondary | ICD-10-CM | POA: Diagnosis not present

## 2019-10-26 DIAGNOSIS — E119 Type 2 diabetes mellitus without complications: Secondary | ICD-10-CM | POA: Diagnosis not present

## 2019-10-26 DIAGNOSIS — H2513 Age-related nuclear cataract, bilateral: Secondary | ICD-10-CM | POA: Diagnosis not present

## 2019-10-26 DIAGNOSIS — H15002 Unspecified scleritis, left eye: Secondary | ICD-10-CM | POA: Diagnosis not present

## 2019-10-26 DIAGNOSIS — H35033 Hypertensive retinopathy, bilateral: Secondary | ICD-10-CM | POA: Diagnosis not present

## 2019-10-26 DIAGNOSIS — Z5181 Encounter for therapeutic drug level monitoring: Secondary | ICD-10-CM | POA: Diagnosis not present

## 2019-11-24 ENCOUNTER — Other Ambulatory Visit: Payer: Self-pay | Admitting: Internal Medicine

## 2019-11-24 DIAGNOSIS — Z1231 Encounter for screening mammogram for malignant neoplasm of breast: Secondary | ICD-10-CM

## 2019-11-30 ENCOUNTER — Other Ambulatory Visit: Payer: Self-pay | Admitting: Cardiology

## 2019-12-07 DIAGNOSIS — H15002 Unspecified scleritis, left eye: Secondary | ICD-10-CM | POA: Diagnosis not present

## 2019-12-07 DIAGNOSIS — H35033 Hypertensive retinopathy, bilateral: Secondary | ICD-10-CM | POA: Diagnosis not present

## 2019-12-07 DIAGNOSIS — Z79899 Other long term (current) drug therapy: Secondary | ICD-10-CM | POA: Diagnosis not present

## 2019-12-07 DIAGNOSIS — H2513 Age-related nuclear cataract, bilateral: Secondary | ICD-10-CM | POA: Diagnosis not present

## 2019-12-07 DIAGNOSIS — Z5181 Encounter for therapeutic drug level monitoring: Secondary | ICD-10-CM | POA: Diagnosis not present

## 2019-12-07 DIAGNOSIS — E119 Type 2 diabetes mellitus without complications: Secondary | ICD-10-CM | POA: Diagnosis not present

## 2019-12-07 DIAGNOSIS — H15102 Unspecified episcleritis, left eye: Secondary | ICD-10-CM | POA: Diagnosis not present

## 2019-12-09 ENCOUNTER — Other Ambulatory Visit: Payer: Self-pay | Admitting: Cardiology

## 2019-12-11 ENCOUNTER — Ambulatory Visit: Payer: Medicare PPO

## 2019-12-24 ENCOUNTER — Ambulatory Visit: Payer: Medicare PPO | Attending: Internal Medicine

## 2019-12-24 DIAGNOSIS — Z23 Encounter for immunization: Secondary | ICD-10-CM | POA: Insufficient documentation

## 2019-12-24 NOTE — Progress Notes (Signed)
   Covid-19 Vaccination Clinic  Name:  [name]    MRN: [id number] DOB: 09/25/1956  12/24/2019  [name] was observed post Covid-19 immunization for 15 minutes without incident. She was provided with Vaccine Information Sheet and instruction to access the V-Safe system.   [name] was instructed to call 911 with any severe reactions post vaccine: [name] Difficulty breathing  . Swelling of face and throat  . A fast heartbeat  . A bad rash all over body  . Dizziness and weakness   Immunizations Administered    Name Date Dose VIS Date Route   Pfizer COVID-19 Vaccine 12/24/2019 12:34 PM 0.3 mL 10/01/2019 Intramuscular   Manufacturer: [place]   Lot: [id number]   [place]: [id number]

## 2019-12-28 ENCOUNTER — Other Ambulatory Visit: Payer: Self-pay | Admitting: Cardiology

## 2020-01-03 ENCOUNTER — Other Ambulatory Visit: Payer: Self-pay

## 2020-01-03 ENCOUNTER — Ambulatory Visit
Admission: RE | Admit: 2020-01-03 | Discharge: 2020-01-03 | Disposition: A | Discharge status: Home / Self Care | Payer: Medicare PPO | Source: Ambulatory Visit | Attending: Internal Medicine | Admitting: Internal Medicine

## 2020-01-03 DIAGNOSIS — Z1231 Encounter for screening mammogram for malignant neoplasm of breast: Secondary | ICD-10-CM | POA: Diagnosis not present

## 2020-01-04 ENCOUNTER — Ambulatory Visit: Payer: Medicare PPO | Admitting: Cardiology

## 2020-01-04 ENCOUNTER — Encounter: Payer: Self-pay | Admitting: Cardiology

## 2020-01-04 VITALS — BP 108/62 | HR 75 | Ht 65.8 in | Wt 249.6 lb

## 2020-01-04 DIAGNOSIS — I480 Paroxysmal atrial fibrillation: Secondary | ICD-10-CM

## 2020-01-04 NOTE — Progress Notes (Signed)
Electrophysiology Office Note   Date:  01/04/2020   ID:  [name], DOB 05-Sep-1956, MRN [id number]  PCP:  Glendale Chard, MD  Cardiologist:  [name] Primary Electrophysiologist:  Darnita Woodrum Meredith Leeds, MD    Chief Complaint: AF   History of Present Illness: [name] is a 64 y.o. female who is being seen today for the evaluation of AF at the request of Glendale Chard, MD. Presenting today for electrophysiology evaluation.  She has a history of coronary artery disease status post circumflex and LAD stents, chronic pericardial effusion with chronic pericarditis and a friction rub, CHF, diabetes, hypertension, non-small cell lung cancer status post surgery and chemotherapy, COPD, pulmonary fibrosis, and atrial fibrillation.  She was admitted to the hospital April 2020 and has since been loaded on dofetilide.  Today, denies symptoms of palpitations, chest pain, shortness of breath, orthopnea, PND, lower extremity edema, claudication, dizziness, presyncope, syncope, bleeding, or neurologic sequela. The patient is tolerating medications without difficulties.  Overall she is doing well.  She is noted no further episodes of atrial fibrillation.  She is tolerating her medications without issue.  She is able to do most of her daily activities without restriction.   Past Medical History:  Diagnosis Date  . Allergy   . Anemia   . Anemia   . Aortic insufficiency    a. Mod by echo 05/2015.  . Arthritis    "knees" (07/11/2015)  . Atrial fibrillation (HCC)    On Eliquis  . Carcinoma of hilus of lung ([place]) 01/20/2012   IIIB NSCL  Right hilar mass compressing esophagus/presenting with dysphagia Rx Surgery/RT/chemo dx January 2001  . Complication of anesthesia    "I had a hard time waking me up w/one of my shoulder surgeries"  . Coronary artery disease    a. Abnormal stress test -> LHc 06/2015 s/p DES to mCX and mLAD, residual D2 disease treated medially.  . Diverticulitis   . Elevated blood  pressure   . Erythema nodosum   . Heart murmur   . Insomnia   . lung ca dx'd 10/1999   chemo/xrt comp 05/29/2000  . Myalgia   . Nephrolithiasis 2015  . Obesity   . Pericardial effusion    a. 05/2015 Echo: EF 55-60%, no rwma, Gr 1 DD, no effusion - but an effusion was seen on CT which was felt to be increased in size compared to 12/2014 - pericardium also thickened.   . Pericarditis    a. Dx 05/2015.  [name] Pneumothorax, spontaneous, tension 01/20/2012   October, 2010  . Pulmonary fibrosis ([place]) 01/20/2012   Due to previous surgery and chest radiation for lung cancer  . Restrictive lung disease   . Type II diabetes mellitus ([place])    Past Surgical History:  Procedure Laterality Date  . ABDOMINAL HYSTERECTOMY  2008  . APPENDECTOMY  1969?  [name] CARDIAC CATHETERIZATION N/A 07/11/2015   Procedure: Left Heart Cath and Coronary Angiography;  Surgeon: Jettie Booze, MD;  Location: [facility];  Service: Cardiovascular;  Laterality: N/A;  . CARDIAC CATHETERIZATION  07/11/2015   Procedure: Coronary Stent Intervention;  Surgeon: Jettie Booze, MD;  Location: [facility];  Service: Cardiovascular;;  . CARPAL TUNNEL RELEASE Bilateral 1998-2005?   right-left  . CARPAL TUNNEL RELEASE Left 2009?   "for the 2nd time"  . COLONOSCOPY    . COLONOSCOPY WITH PROPOFOL N/A 05/28/2018   Procedure: COLONOSCOPY WITH PROPOFOL;  Surgeon: Milus Banister, MD;  Location:  [facility];  Service: Endoscopy;  Laterality: N/A;  . CORONARY ANGIOPLASTY    . CYSTOSCOPY W/ STONE MANIPULATION  2013  . ESOPHAGOGASTRODUODENOSCOPY (EGD) WITH ESOPHAGEAL DILATION  2012  . FOOT SURGERY Bilateral 1980's   Callous removed   . INNER EAR SURGERY Right ~ 2009  . KNEE ARTHROPLASTY Right 1991  . KNEE ARTHROSCOPY  ~ 2000   LATERAL RELEASE  . LEFT HEART CATH AND CORONARY ANGIOGRAPHY N/A 12/18/2016   Procedure: Left Heart Cath and Coronary Angiography;  Surgeon: Burnell Blanks, MD;  Location: [facility];   Service: Cardiovascular;  Laterality: N/A;  . LUNG CANCER SURGERY Left 2001   "small cell"  . LUNG SURGERY Right 2007   "reinflated it"  . PULMONARY EMBOLISM SURGERY  2007   LUNG COLLAPSE  . RIGHT AND LEFT HEART CATH N/A 06/25/2017   Procedure: RIGHT AND LEFT HEART CATH;  Surgeon: Sherren Mocha, MD;  Location: [facility];  Service: Cardiovascular;  Laterality: N/A;  . SHOULDER ADHESION RELEASE Right ~ 2004  . SHOULDER ARTHROSCOPY W/ ROTATOR CUFF REPAIR Right ~ 2003     Current Outpatient Medications  Medication Sig Dispense Refill  . B-D UF III MINI PEN NEEDLES 31G X 5 MM MISC 2 (two) times daily. as directed    . co-enzyme Q-10 50 MG capsule TAKE 1 CAPSULE BY MOUTH EVERY DAY 30 capsule 7  . diltiazem (CARDIZEM CD) 360 MG 24 hr capsule TAKE 1 CAPSULE BY MOUTH EVERY DAY 90 capsule 2  . dofetilide (TIKOSYN) 500 MCG capsule Take 1 capsule (500 mcg total) by mouth 2 (two) times daily. 180 capsule 3  . ELIQUIS 5 MG TABS tablet TAKE 1 TABLET BY MOUTH TWICE A DAY 161 tablet 2  . folic acid (FOLVITE) 1 MG tablet Take 1 mg by mouth daily.    . furosemide (LASIX) 40 MG tablet Take 40 mg by mouth as needed.    [name] HUMALOG MIX 75/25 KWIKPEN (75-25) 100 UNIT/ML Kwikpen Inject 30-40 Units into the skin See admin instructions. Inject 40 units SQ in Am and 35 units SQ in PM  6  . levothyroxine (SYNTHROID, LEVOTHROID) 50 MCG tablet Take 50 mcg by mouth daily before breakfast.   6  . losartan (COZAAR) 25 MG tablet Take 1 tablet (25 mg total) by mouth daily. Please schedule annual appt with Dr. [name] for refills. [phone]. 1st attempt. 30 tablet 0  . methotrexate (RHEUMATREX) 2.5 MG tablet Take 25 mg by mouth once a week.    . metoprolol tartrate (LOPRESSOR) 25 MG tablet TAKE 1 TABLET (25 MG TOTAL) BY MOUTH 2 (TWO) TIMES DAILY. 180 tablet 2  . nitroGLYCERIN (NITROSTAT) 0.4 MG SL tablet Place 1 tablet (0.4 mg total) under the tongue every 5 (five) minutes as needed for chest pain. 25 tablet 3  .  ONE TOUCH ULTRA TEST test strip USE AS DIRECTED 3 TIMES A DAY    . OZEMPIC, 0.25 OR 0.5 MG/DOSE, 2 MG/1.5ML SOPN INJECT 0.5 MG INTO THE SKIN ONCE A WEEK. 3 pen 3  . pravastatin (PRAVACHOL) 20 MG tablet Take 1 tablet (20 mg total) by mouth daily. 90 tablet 2  . spironolactone (ALDACTONE) 25 MG tablet TAKE 1 TABLET BY MOUTH EVERY DAY 90 tablet 3   No current facility-administered medications for this visit.    Allergies:   Hydrocodone, Hydrocodone-acetaminophen, Morphine and related, Rosuvastatin, Lisinopril, and Nsaids   Social History:  The patient  reports that she quit smoking about 20 years  ago. Her smoking use included cigarettes. She has a 1.25 pack-year smoking history. She has never used smokeless tobacco. She reports that she does not drink alcohol or use drugs.   Family History:  The patient's family history includes Diabetes in her father, maternal grandmother, mother, paternal grandmother, sister, sister, sister, and sister; Heart disease in her father; Hypertension in her sister; Kidney disease in her sister; Pancreatic cancer in her mother.    ROS:  Please see the history of present illness.   Otherwise, review of systems is positive for none.   All other systems are reviewed and negative.   PHYSICAL EXAM: VS:  BP 108/62   Pulse 75   Ht 5' 5.8" (1.671 m)   Wt 249 lb 9.6 oz (113.2 kg)   SpO2 96%   BMI 40.53 kg/m  , BMI Body mass index is 40.53 kg/m. GEN: Well nourished, well developed, in no acute distress  HEENT: normal  Neck: no JVD, carotid bruits, or masses Cardiac: RRR; 2 out of 6 systolic murmur at the base, no rubs, or gallops,no edema  Respiratory:  clear to auscultation bilaterally, normal work of breathing GI: soft, nontender, nondistended, + BS MS: no deformity or atrophy  Skin: warm and dry Neuro:  Strength and sensation are intact Psych: euthymic mood, full affect  EKG:  EKG is ordered today. Personal review of the ekg ordered shows sinus rhythm, rate  75  Recent Labs: 02/17/2019: B Natriuretic Peptide 46.5 03/09/2019: TSH 3.340 03/13/2019: Hemoglobin 10.8; Platelets 277 03/22/2019: BUN 21; Creatinine, Ser 0.94; Magnesium 2.2; Potassium 3.9; Sodium 140    Lipid Panel     Component Value Date/Time   CHOL 131 03/09/2019 0857   TRIG 77 03/09/2019 0857   HDL 48 03/09/2019 0857   CHOLHDL 2.7 03/09/2019 0857   CHOLHDL 3.1 02/17/2019 0215   VLDL 17 02/17/2019 0215   LDLCALC 68 03/09/2019 0857     Wt Readings from Last 3 Encounters:  01/04/20 249 lb 9.6 oz (113.2 kg)  09/01/19 248 lb 6.4 oz (112.7 kg)  06/30/19 247 lb 12.8 oz (112.4 kg)      Other studies Reviewed: Additional studies/ records that were reviewed today include: TTE 02/17/19  Review of the above records today demonstrates:   1. The left ventricle has hyperdynamic systolic function, with an ejection fraction of >65%. The cavity size was normal. There is moderately increased left ventricular wall thickness. Left ventricular diastolic Doppler parameters are consistent with  pseudonormalization. Elevated left atrial and left ventricular end-diastolic pressures The E/e' is >15. No evidence of left ventricular regional wall motion abnormalities.  2. The right ventricle has normal systolic function. The cavity was normal. There is no increase in right ventricular wall thickness.  3. The mitral valve is grossly normal.  4. The tricuspid valve is grossly normal.  5. The aortic valve is tricuspid. Mild sclerosis of the aortic valve. Aortic valve regurgitation is mild to moderate by color flow Doppler. mildly increased gradient without stenosis of the aortic valve.  6. The aortic root and ascending aorta are normal in size and structure.  7. The inferior vena cava was dilated in size with <50% respiratory variability.   ASSESSMENT AND PLAN:  1.  Paroxysmal atrial fibrillation: Currently on dofetilide and Eliquis.  CHA2DS2-VASc of 4.  She fortunately remains in sinus rhythm.  She  did wear a cardiac monitor that showed less than 1% atrial fibrillation.  We Cristina Mattern continue with current medications.  2.  Recurrent pericarditis: Followed  in general cardiology clinic.  3.  Chronic diastolic heart failure: No signs of volume overload  Current medicines are reviewed at length with the patient today.   The patient does not have concerns regarding her medicines.  The following changes were made today: None  Labs/ tests ordered today include:  Orders Placed This Encounter  Procedures  . EKG 12-Lead     Disposition:   FU with Horris Speros 6 months  Signed, Machael Raine Meredith Leeds, MD  01/04/2020 3:37 PM     [address] [place] [address] [phone] (office) [phone] (fax)

## 2020-01-19 ENCOUNTER — Ambulatory Visit: Payer: Medicare PPO | Attending: Internal Medicine

## 2020-01-19 DIAGNOSIS — Z23 Encounter for immunization: Secondary | ICD-10-CM

## 2020-01-19 NOTE — Progress Notes (Signed)
   Covid-19 Vaccination Clinic  Name:  [name]    MRN: [id number] DOB: 1956-08-27  01/19/2020  [name] was observed post Covid-19 immunization for 15 minutes without incident. She was provided with Vaccine Information Sheet and instruction to access the V-Safe system.   [name] was instructed to call 911 with any severe reactions post vaccine: [name] Difficulty breathing  . Swelling of face and throat  . A fast heartbeat  . A bad rash all over body  . Dizziness and weakness   Immunizations Administered    Name Date Dose VIS Date Route   Pfizer COVID-19 Vaccine 01/19/2020 12:34 PM 0.3 mL 10/01/2019 Intramuscular   Manufacturer: [place]   Lot: [id number]   [place]: [id number]

## 2020-01-23 ENCOUNTER — Other Ambulatory Visit: Payer: Self-pay

## 2020-01-23 ENCOUNTER — Emergency Department (HOSPITAL_COMMUNITY)
Admission: EM | Admit: 2020-01-23 | Discharge: 2020-01-23 | Disposition: A | Discharge status: Home / Self Care | Payer: Medicare PPO | Attending: Emergency Medicine | Admitting: Emergency Medicine

## 2020-01-23 ENCOUNTER — Emergency Department (HOSPITAL_COMMUNITY): Payer: Medicare PPO

## 2020-01-23 ENCOUNTER — Encounter (HOSPITAL_COMMUNITY): Payer: Self-pay

## 2020-01-23 DIAGNOSIS — Z85118 Personal history of other malignant neoplasm of bronchus and lung: Secondary | ICD-10-CM | POA: Insufficient documentation

## 2020-01-23 DIAGNOSIS — Z79899 Other long term (current) drug therapy: Secondary | ICD-10-CM | POA: Insufficient documentation

## 2020-01-23 DIAGNOSIS — Z87891 Personal history of nicotine dependence: Secondary | ICD-10-CM | POA: Diagnosis not present

## 2020-01-23 DIAGNOSIS — Z794 Long term (current) use of insulin: Secondary | ICD-10-CM | POA: Insufficient documentation

## 2020-01-23 DIAGNOSIS — Z7901 Long term (current) use of anticoagulants: Secondary | ICD-10-CM | POA: Insufficient documentation

## 2020-01-23 DIAGNOSIS — I110 Hypertensive heart disease with heart failure: Secondary | ICD-10-CM | POA: Insufficient documentation

## 2020-01-23 DIAGNOSIS — I4891 Unspecified atrial fibrillation: Secondary | ICD-10-CM | POA: Diagnosis not present

## 2020-01-23 DIAGNOSIS — I2510 Atherosclerotic heart disease of native coronary artery without angina pectoris: Secondary | ICD-10-CM | POA: Diagnosis not present

## 2020-01-23 DIAGNOSIS — R911 Solitary pulmonary nodule: Secondary | ICD-10-CM

## 2020-01-23 DIAGNOSIS — I5032 Chronic diastolic (congestive) heart failure: Secondary | ICD-10-CM | POA: Insufficient documentation

## 2020-01-23 DIAGNOSIS — Z955 Presence of coronary angioplasty implant and graft: Secondary | ICD-10-CM | POA: Insufficient documentation

## 2020-01-23 DIAGNOSIS — E119 Type 2 diabetes mellitus without complications: Secondary | ICD-10-CM | POA: Insufficient documentation

## 2020-01-23 DIAGNOSIS — Z96651 Presence of right artificial knee joint: Secondary | ICD-10-CM | POA: Insufficient documentation

## 2020-01-23 DIAGNOSIS — R079 Chest pain, unspecified: Secondary | ICD-10-CM | POA: Diagnosis not present

## 2020-01-23 LAB — CBC
HCT: 37.9 % (ref 36.0–46.0)
Hemoglobin: 12.2 g/dL (ref 12.0–15.0)
MCH: 32.3 pg (ref 26.0–34.0)
MCHC: 32.2 g/dL (ref 30.0–36.0)
MCV: 100.3 fL — ABNORMAL HIGH (ref 80.0–100.0)
Platelets: 363 K/uL (ref 150–400)
RBC: 3.78 MIL/uL — ABNORMAL LOW (ref 3.87–5.11)
RDW: 12.6 % (ref 11.5–15.5)
WBC: 8 K/uL (ref 4.0–10.5)
nRBC: 0 % (ref 0.0–0.2)

## 2020-01-23 LAB — BASIC METABOLIC PANEL WITH GFR
Anion gap: 12 (ref 5–15)
BUN: 20 mg/dL (ref 8–23)
CO2: 26 mmol/L (ref 22–32)
Calcium: 9.6 mg/dL (ref 8.9–10.3)
Chloride: 102 mmol/L (ref 98–111)
Creatinine, Ser: 1.07 mg/dL — ABNORMAL HIGH (ref 0.44–1.00)
GFR calc Af Amer: >60 mL/min
GFR calc non Af Amer: 56 mL/min — ABNORMAL LOW
Glucose, Bld: 143 mg/dL — ABNORMAL HIGH (ref 70–99)
Potassium: 3.7 mmol/L (ref 3.5–5.1)
Sodium: 140 mmol/L (ref 135–145)

## 2020-01-23 LAB — TSH: TSH: 1.96 u[IU]/mL (ref 0.350–4.500)

## 2020-01-23 LAB — TROPONIN I (HIGH SENSITIVITY): Troponin I (High Sensitivity): 10 ng/L

## 2020-01-23 LAB — BRAIN NATRIURETIC PEPTIDE: B Natriuretic Peptide: 73.9 pg/mL (ref 0.0–100.0)

## 2020-01-23 LAB — MAGNESIUM: Magnesium: 1.8 mg/dL (ref 1.7–2.4)

## 2020-01-23 MED ORDER — ACETAMINOPHEN 325 MG PO TABS
650.0000 mg | ORAL_TABLET | Freq: Once | ORAL | Status: AC
  Administered 2020-01-23: 650 mg via ORAL
  Filled 2020-01-23: qty 2

## 2020-01-23 MED ORDER — METOPROLOL TARTRATE 5 MG/5ML IV SOLN
5.0000 mg | INTRAVENOUS | Status: DC | PRN
  Administered 2020-01-23: 5 mg via INTRAVENOUS
  Filled 2020-01-23: qty 5

## 2020-01-23 MED ORDER — SODIUM CHLORIDE 0.9% FLUSH: 3.0000 mL | Freq: Once | INTRAVENOUS | Status: DC

## 2020-01-23 NOTE — Discharge Instructions (Signed)
Get help right away if you have return of your racing heart, chest pain, or worsening shortness of breath.

## 2020-01-23 NOTE — ED Notes (Signed)
Pt back from xray now in NSR with a rate of 95.

## 2020-01-23 NOTE — ED Provider Notes (Addendum)
[facility] EMERGENCY DEPARTMENT Provider Note   CSN: [id number] Arrival date & time: 01/23/20  1350     History Chief Complaint  Patient presents with  . Chest Pain  . Atrial Fibrillation    [name] is a 64 y.o. female whohas a history of coronary artery disease status post circumflex and LAD stents, chronic pericardial effusion with chronic pericarditis and a friction rub, CHF, diabetes, hypertension, non-small cell lung cancer status post surgery and chemotherapy, COPD, pulmonary fibrosis, and atrial fibrillation.  She is on multiple meditations, she takes Eliquis and is compliant with her medication regimen. She recently saw Dr. Curt Bears who started her on Tikosyn.  The patient was celebrating Easter service at [employer].  She got in the car to go drop off a care package at friend's house and when she got back into her car felt the tightness she feels when her A. fib acts up, had some chest pain and began to feel her heart racing.  Patient states that she went home to try to change her close.  She sat down and tried to catch her breath and see if her heart rate would slow down however it seemed to worsen so she drove herself to the emergency department.  She continues to feel quite short of breath.  She states that her symptoms began at 12:45 PM today.  He denies any recent cough, fever, urinary symptoms. HPI     Past Medical History:  Diagnosis Date  . Allergy   . Anemia   . Anemia   . Aortic insufficiency    a. Mod by echo 05/2015.  . Arthritis    "knees" (07/11/2015)  . Atrial fibrillation (HCC)    On Eliquis  . Carcinoma of hilus of lung ([place]) 01/20/2012   IIIB NSCL  Right hilar mass compressing esophagus/presenting with dysphagia Rx Surgery/RT/chemo dx January 2001  . Complication of anesthesia    "I had a hard time waking me up w/one of my shoulder surgeries"  . Coronary artery disease    a. Abnormal stress test -> LHc 06/2015 s/p DES to mCX and mLAD,  residual D2 disease treated medially.  . Diverticulitis   . Elevated blood pressure   . Erythema nodosum   . Heart murmur   . Insomnia   . lung ca dx'd 10/1999   chemo/xrt comp 05/29/2000  . Myalgia   . Nephrolithiasis 2015  . Obesity   . Pericardial effusion    a. 05/2015 Echo: EF 55-60%, no rwma, Gr 1 DD, no effusion - but an effusion was seen on CT which was felt to be increased in size compared to 12/2014 - pericardium also thickened.   . Pericarditis    a. Dx 05/2015.  [name] Pneumothorax, spontaneous, tension 01/20/2012   October, 2010  . Pulmonary fibrosis ([place]) 01/20/2012   Due to previous surgery and chest radiation for lung cancer  . Restrictive lung disease   . Type II diabetes mellitus [facility])     Patient Active Problem List   Diagnosis Date Noted  . Hypothyroidism 02/17/2019  . Non-small cell lung cancer ([place]) 01/19/2019  . Cellulitis of fourth toe, left 01/01/2019  . Diverticulosis of colon without hemorrhage   . Chronic diastolic CHF (congestive heart failure) ([place]) 08/04/2017  . 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 Unstable angina ([place]) 04/12/2017  . Atrial fibrillation with RVR ([place])   . Hyperlipidemia 08/07/2015  . CAD S/P percutaneous coronary angioplasty 07/25/2015  .  Hypertension, essential   . Aortic insufficiency   . Stented coronary artery   . Abnormal nuclear stress test 07/11/2015  . Abnormal stress test 07/04/2015  . Pericarditis   . Controlled type 2 diabetes mellitus without complication, with long-term current use of insulin ([place])   . Pericardial effusion   . Chest pain 05/30/2015  . 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 Open angle with borderline findings and low glaucoma risk in both eyes 05/23/2015  . Restrictive lung disease 03/14/2015  . CAD (coronary artery disease) 10/21/2014  . Carcinoma of  hilus of lung ([place]) 01/20/2012  . Insomnia   . Obesity   . Anemia   . Kidney stones   . UNDIAGNOSED CARDIAC MURMURS 02/24/2009  . Lung cancer ([place]) 09/20/2008  . DYSPNEA 09/20/2008  . DIABETES MELLITUS, TYPE II 06/19/2007  . Spontaneous pneumothorax 10/21/2005    Past Surgical History:  Procedure Laterality Date  . ABDOMINAL HYSTERECTOMY  2008  . APPENDECTOMY  1969?  [name] CARDIAC CATHETERIZATION N/A 07/11/2015   Procedure: Left Heart Cath and Coronary Angiography;  Surgeon: Jettie Booze, MD;  Location: [facility] LAB;  Service: Cardiovascular;  Laterality: N/A;  . CARDIAC CATHETERIZATION  07/11/2015   Procedure: Coronary Stent Intervention;  Surgeon: Jettie Booze, MD;  Location: [facility] [place] [facility];  Service: Cardiovascular;;  . CARPAL TUNNEL RELEASE Bilateral 1998-2005?   right-left  . CARPAL TUNNEL RELEASE Left 2009?   "for the 2nd time"  . COLONOSCOPY    . COLONOSCOPY WITH PROPOFOL N/A 05/28/2018   Procedure: COLONOSCOPY WITH PROPOFOL;  Surgeon: Milus Banister, MD;  Location: [facility];  Service: Endoscopy;  Laterality: N/A;  . CORONARY ANGIOPLASTY    . CYSTOSCOPY W/ STONE MANIPULATION  2013  . ESOPHAGOGASTRODUODENOSCOPY (EGD) WITH ESOPHAGEAL DILATION  2012  . FOOT SURGERY Bilateral 1980's   Callous removed   . INNER EAR SURGERY Right ~ 2009  . KNEE ARTHROPLASTY Right 1991  . KNEE ARTHROSCOPY  ~ 2000   LATERAL RELEASE  . LEFT HEART CATH AND CORONARY ANGIOGRAPHY N/A 12/18/2016   Procedure: Left Heart Cath and Coronary Angiography;  Surgeon: Burnell Blanks, MD;  Location: [facility];  Service: Cardiovascular;  Laterality: N/A;  . LUNG CANCER SURGERY Left 2001   "small cell"  . LUNG SURGERY Right 2007   "reinflated it"  . PULMONARY EMBOLISM SURGERY  2007   LUNG COLLAPSE  . RIGHT AND LEFT HEART CATH N/A 06/25/2017   Procedure: RIGHT AND LEFT HEART CATH;  Surgeon: Sherren Mocha, MD;  Location: [facility] LAB;  Service: Cardiovascular;   Laterality: N/A;  . SHOULDER ADHESION RELEASE Right ~ 2004  . SHOULDER ARTHROSCOPY W/ ROTATOR CUFF REPAIR Right ~ 2003     OB History   No obstetric history on file.     Family History  Problem Relation Age of Onset  . Heart disease Father   . Diabetes Father   . Pancreatic cancer Mother   . Diabetes Mother   . Hypertension Sister        x 3  . Diabetes Sister        x 4  . Diabetes Maternal Grandmother   . Diabetes Paternal Grandmother   . Diabetes Sister   . Diabetes Sister   . Diabetes Sister   . Kidney disease Sister   . Colon cancer Neg Hx   .  Colon polyps Neg Hx   . Rectal cancer Neg Hx   . Stomach cancer Neg Hx   . Esophageal cancer Neg Hx   . Breast cancer Neg Hx     Social History   Tobacco Use  . Smoking status: Former Smoker    Packs/day: 0.25    Years: 5.00    Pack years: 1.25    Types: Cigarettes    Quit date: 07/22/1999    Years since quitting: 20.5  . Smokeless tobacco: Never Used  Substance Use Topics  . Alcohol use: No    Alcohol/week: 0.0 standard drinks  . Drug use: No    Home Medications Prior to Admission medications   Medication Sig Start Date End Date Taking? Authorizing Provider  B-D UF III MINI PEN NEEDLES 31G X 5 MM MISC 2 (two) times daily. as directed 10/31/18   [provider]  co-enzyme Q-10 50 MG capsule TAKE 1 CAPSULE BY MOUTH EVERY DAY 11/30/19   Dorothy Spark, MD  diltiazem (CARDIZEM CD) 360 MG 24 hr capsule TAKE 1 CAPSULE BY MOUTH EVERY DAY 07/21/19   Camnitz, Ocie Doyne, MD  dofetilide (TIKOSYN) 500 MCG capsule Take 1 capsule (500 mcg total) by mouth 2 (two) times daily. 03/29/19   Camnitz, Will Hassell Done, MD  ELIQUIS 5 MG TABS tablet TAKE 1 TABLET BY MOUTH TWICE A DAY 07/26/19   Dorothy Spark, MD  folic acid (FOLVITE) 1 MG tablet Take 1 mg by mouth daily. 12/02/19   [provider]  furosemide (LASIX) 40 MG tablet Take 40 mg by mouth as needed.    [provider]  HUMALOG MIX 75/25 KWIKPEN  (75-25) 100 UNIT/ML Kwikpen Inject 30-40 Units into the skin See admin instructions. Inject 40 units SQ in Am and 35 units SQ in PM 12/22/15   [provider]  levothyroxine (SYNTHROID, LEVOTHROID) 50 MCG tablet Take 50 mcg by mouth daily before breakfast.  05/14/17   [provider]  losartan (COZAAR) 25 MG tablet Take 1 tablet (25 mg total) by mouth daily. Please schedule annual appt with Dr. [name] for refills. [phone]. 1st attempt. 12/09/19   Dorothy Spark, MD  methotrexate (RHEUMATREX) 2.5 MG tablet Take 25 mg by mouth once a week. 12/07/19   [provider]  metoprolol tartrate (LOPRESSOR) 25 MG tablet TAKE 1 TABLET (25 MG TOTAL) BY MOUTH 2 (TWO) TIMES DAILY. 02/24/19   Dorothy Spark, MD  nitroGLYCERIN (NITROSTAT) 0.4 MG SL tablet Place 1 tablet (0.4 mg total) under the tongue every 5 (five) minutes as needed for chest pain. 01/02/18   Simmons, Brittainy M, PA-C  ONE TOUCH ULTRA TEST test strip USE AS DIRECTED 3 TIMES A DAY 10/04/18   [provider]  OZEMPIC, 0.25 OR 0.5 MG/DOSE, 2 MG/1.5ML SOPN INJECT 0.5 MG INTO THE SKIN ONCE A WEEK. 03/30/19   Glendale Chard, MD  pravastatin (PRAVACHOL) 20 MG tablet Take 1 tablet (20 mg total) by mouth daily. 12/29/19   Dorothy Spark, MD  spironolactone (ALDACTONE) 25 MG tablet TAKE 1 TABLET BY MOUTH EVERY DAY 07/21/19   Dorothy Spark, MD    Allergies    Hydrocodone, Hydrocodone-acetaminophen, Morphine and related, Rosuvastatin, Lisinopril, and Nsaids  Review of Systems   Review of Systems Ten systems reviewed and are negative for acute change, except as noted in the HPI.   Physical Exam Updated Vital Signs BP 136/72 (BP Location: Right Arm)   Pulse (!) 168   Temp 97.9 F (  36.6 C) (Oral)   Resp 18   Ht 5' 7.5" (1.715 m)   Wt 109.8 kg   SpO2 100%   BMI 37.34 kg/m   Physical Exam Vitals and nursing note reviewed.  Constitutional:      General: She is not in acute distress.    Appearance: She  is well-developed. She is not diaphoretic.  HENT:     Head: Normocephalic and atraumatic.  Eyes:     General: No scleral icterus.    Conjunctiva/sclera: Conjunctivae normal.  Cardiovascular:     Rate and Rhythm: Tachycardia present. Rhythm irregular.     Heart sounds: Normal heart sounds. No murmur. No friction rub. No gallop.   Pulmonary:     Effort: Tachypnea present. No respiratory distress.     Breath sounds: Normal breath sounds.  Abdominal:     General: Bowel sounds are normal. There is no distension.     Palpations: Abdomen is soft. There is no mass.     Tenderness: There is no abdominal tenderness. There is no guarding.  Musculoskeletal:     Cervical back: Normal range of motion.  Skin:    General: Skin is warm and dry.  Neurological:     Mental Status: She is alert and oriented to person, place, and time.  Psychiatric:        Behavior: Behavior normal.     ED Results / Procedures / Treatments   Labs (all labs ordered are listed, but only abnormal results are displayed) Labs Reviewed  BASIC METABOLIC PANEL  CBC  MAGNESIUM  TSH  BRAIN NATRIURETIC PEPTIDE  TROPONIN I (HIGH SENSITIVITY)    EKG EKG Interpretation  Date/Time:  Sunday January 23 2020 13:53:17 EDT Ventricular Rate:  168 PR Interval:    QRS Duration: 90 QT Interval:  282 QTC Calculation: 471 R Axis:   73 Text Interpretation: Atrial fibrillation with rapid ventricular response Nonspecific ST and T wave abnormality Abnormal ECG afib rvr is new since last tracing Confirmed by Dorie Rank [phone]) on 01/23/2020 2:02:14 PM   Radiology No results found.  Procedures .Critical Care Performed by: Margarita Mail, PA-C Authorized by: Margarita Mail, PA-C   Critical care provider statement:    Critical care time (minutes):  30   Critical care was necessary to treat or prevent imminent or life-threatening deterioration of the following conditions:  Cardiac failure   Critical care was time spent  personally by me on the following activities:  Discussions with consultants, evaluation of patient's response to treatment, examination of patient, ordering and performing treatments and interventions, ordering and review of laboratory studies, ordering and review of radiographic studies, pulse oximetry, re-evaluation of patient's condition, obtaining history from patient or surrogate and review of old charts   (including critical care time)  Medications Ordered in ED Medications  sodium chloride flush (NS) 0.9 % injection 3 mL (has no administration in time range)  metoprolol tartrate (LOPRESSOR) injection 5 mg (has no administration in time range)    ED Course  I have reviewed the triage vital signs and the nursing notes.  Pertinent labs & imaging results that were available during my care of the patient were reviewed by me and considered in my medical decision making (see chart for details).  Clinical Course as of Jan 23 1636  Sun Jan 23, 2020  1450 Patient has converted, Repeat EKG shows normal sinus rhythm at a rate of 90.  She does have some ST segment elevation and prolonged QT   [AH]  Clinical Course User Index [AH] Margarita Mail, PA-C   MDM Rules/Calculators/A&P     CHA2DS2-VASc Score: 5                 This patient complains of afib with rvr, this involves an extensive number of treatment options, and is a complaint that carries with it a high risk of complications and morbidity.  The differential diagnosis includes thyrotoxicosis, pathogenetic abuse, infection, PE, pericarditis, valvular disease, acute ischemia.  I Ordered, reviewed, and interpreted labs, which included magnesium level, BnP, CBC which shows no acute abnormalities.  BMP shows a mildly elevated glucose, slight elevation in creatinineTo 1.07 up from 0.9 for an elevated insignificant value.  TSH within normal limits troponin value within normal limits  I ordered medication IV metoprolol for rapid ventricular  rate  I ordered imaging studies which included a 2 view chest x-ray  I independently visualized and interpreted imaging which showed an opacity in the right perihilar region which will need further characterization with CT chest with contrast.   Previous records obtained and reviewed     Critical interventions: IV metoprolol for RVR  After the interventions stated above, I reevaluated the patient and found complete resolution of the patient's rapid ventricular rate  Patient is greatly improved after interventions.  She will be discharged with follow-up in the A. fib clinic along with Dr. Curt Bears.  I have sent message to the patient's PCP about coordinating outpatient CT chest with contrast for further characterization of right-sided perihilar opacity.  Patient understands that she will need to follow-up.  She is feeling much better and appears appropriate for discharge at this time Final Clinical Impression(s) / ED Diagnoses Final diagnoses:  None    Rx / DC Orders ED Discharge Orders    None       Margarita Mail, PA-C 01/23/20 2149    Margarita Mail, PA-C 01/23/20 2150    Dorie Rank, MD 01/24/20 1336

## 2020-01-23 NOTE — ED Triage Notes (Signed)
Onset today after church pt started feeling heart beating fast and chest pain.

## 2020-01-23 NOTE — ED Notes (Signed)
Pt given 5mg  metoprolol push -- rate decreased to 110-120 from 140-150 in Afib

## 2020-01-24 ENCOUNTER — Ambulatory Visit: Payer: Medicare PPO | Admitting: Internal Medicine

## 2020-01-24 ENCOUNTER — Telehealth: Payer: Self-pay | Admitting: Cardiology

## 2020-01-24 NOTE — Telephone Encounter (Signed)
Patient called to schedule with Dr. Curt Bears for hospital f/u. Went to chart to see what discharge papers say. Dr. Curt Bears does not have anything until June and we could schedule with an APP. She stated she was going to call the afib clinic.

## 2020-01-25 ENCOUNTER — Other Ambulatory Visit: Payer: Self-pay

## 2020-01-25 ENCOUNTER — Encounter: Payer: Self-pay | Admitting: Internal Medicine

## 2020-01-25 ENCOUNTER — Ambulatory Visit (HOSPITAL_COMMUNITY)
Admission: RE | Admit: 2020-01-25 | Discharge: 2020-01-25 | Disposition: A | Discharge status: Home / Self Care | Payer: Medicare PPO | Source: Ambulatory Visit | Attending: Physician Assistant | Admitting: Physician Assistant

## 2020-01-25 ENCOUNTER — Ambulatory Visit: Payer: Medicare PPO | Admitting: Internal Medicine

## 2020-01-25 VITALS — BP 140/66 | HR 102 | Ht 67.2 in | Wt 246.0 lb

## 2020-01-25 VITALS — BP 128/64 | HR 104 | Temp 98.0°F | Ht 67.2 in | Wt 245.2 lb

## 2020-01-25 DIAGNOSIS — Z6838 Body mass index (BMI) 38.0-38.9, adult: Secondary | ICD-10-CM

## 2020-01-25 DIAGNOSIS — Z885 Allergy status to narcotic agent status: Secondary | ICD-10-CM | POA: Insufficient documentation

## 2020-01-25 DIAGNOSIS — I480 Paroxysmal atrial fibrillation: Secondary | ICD-10-CM | POA: Diagnosis not present

## 2020-01-25 DIAGNOSIS — J449 Chronic obstructive pulmonary disease, unspecified: Secondary | ICD-10-CM | POA: Diagnosis not present

## 2020-01-25 DIAGNOSIS — I110 Hypertensive heart disease with heart failure: Secondary | ICD-10-CM | POA: Diagnosis not present

## 2020-01-25 DIAGNOSIS — R9389 Abnormal findings on diagnostic imaging of other specified body structures: Secondary | ICD-10-CM

## 2020-01-25 DIAGNOSIS — I1310 Hypertensive heart and chronic kidney disease without heart failure, with stage 1 through stage 4 chronic kidney disease, or unspecified chronic kidney disease: Secondary | ICD-10-CM

## 2020-01-25 DIAGNOSIS — Z794 Long term (current) use of insulin: Secondary | ICD-10-CM | POA: Insufficient documentation

## 2020-01-25 DIAGNOSIS — Z712 Person consulting for explanation of examination or test findings: Secondary | ICD-10-CM | POA: Diagnosis not present

## 2020-01-25 DIAGNOSIS — Z8249 Family history of ischemic heart disease and other diseases of the circulatory system: Secondary | ICD-10-CM | POA: Diagnosis not present

## 2020-01-25 DIAGNOSIS — Z79899 Other long term (current) drug therapy: Secondary | ICD-10-CM | POA: Diagnosis not present

## 2020-01-25 DIAGNOSIS — Z7901 Long term (current) use of anticoagulants: Secondary | ICD-10-CM | POA: Diagnosis not present

## 2020-01-25 DIAGNOSIS — I2510 Atherosclerotic heart disease of native coronary artery without angina pectoris: Secondary | ICD-10-CM | POA: Diagnosis not present

## 2020-01-25 DIAGNOSIS — Z886 Allergy status to analgesic agent status: Secondary | ICD-10-CM | POA: Insufficient documentation

## 2020-01-25 DIAGNOSIS — E669 Obesity, unspecified: Secondary | ICD-10-CM | POA: Insufficient documentation

## 2020-01-25 DIAGNOSIS — Z833 Family history of diabetes mellitus: Secondary | ICD-10-CM | POA: Diagnosis not present

## 2020-01-25 DIAGNOSIS — I482 Chronic atrial fibrillation, unspecified: Secondary | ICD-10-CM

## 2020-01-25 DIAGNOSIS — D6869 Other thrombophilia: Secondary | ICD-10-CM

## 2020-01-25 DIAGNOSIS — Z87891 Personal history of nicotine dependence: Secondary | ICD-10-CM | POA: Diagnosis not present

## 2020-01-25 DIAGNOSIS — E119 Type 2 diabetes mellitus without complications: Secondary | ICD-10-CM | POA: Diagnosis not present

## 2020-01-25 DIAGNOSIS — I5032 Chronic diastolic (congestive) heart failure: Secondary | ICD-10-CM | POA: Insufficient documentation

## 2020-01-25 DIAGNOSIS — Z85118 Personal history of other malignant neoplasm of bronchus and lung: Secondary | ICD-10-CM | POA: Diagnosis not present

## 2020-01-25 LAB — BASIC METABOLIC PANEL
Anion gap: 8 (ref 5–15)
BUN: 25 mg/dL — ABNORMAL HIGH (ref 8–23)
CO2: 28 mmol/L (ref 22–32)
Calcium: 9.3 mg/dL (ref 8.9–10.3)
Chloride: 104 mmol/L (ref 98–111)
Creatinine, Ser: 1.32 mg/dL — ABNORMAL HIGH (ref 0.44–1.00)
GFR calc Af Amer: 50 mL/min — ABNORMAL LOW (ref >60)
GFR calc non Af Amer: 43 mL/min — ABNORMAL LOW (ref >60)
Glucose, Bld: 187 mg/dL — ABNORMAL HIGH (ref 70–99)
Potassium: 4.5 mmol/L (ref 3.5–5.1)
Sodium: 140 mmol/L (ref 135–145)

## 2020-01-25 NOTE — Progress Notes (Signed)
Primary Care Physician: Glendale Chard, MD Primary Cardiologist: Dr [name] Primary Electrophysiologist: Dr Curt Bears Referring Physician: Dr Lona Kettle [name] is a 64 y.o. female with a history of paroxysmal atrial fibrillation, CAD s/p DES to [facility] and mLAD 07/11/15 (patent stents on repeat cath 11/2016),chronic pericardial effusion, h/ochronicpericarditiswith chronic pericardial rub treated intermittently with steroids and colchicine,chronic diastolic CHF,DM, HTN,remote h/o treatednon-small cell lung cancer, pulmonary fibrosis, aortic insufficiency, COPD and chronic anemia who presents for follow up in the [facility]. Patient was admitted for symptomatic atrial fibrillation with RVR 01/2019. She was started on Tikosyn during her admission. She denies snoring or significant alcohol use.  On follow up today, patient was seen in the ER on 01/23/20 with symptoms of heart racing and SOB. She was found to be in afib with RVR. She converted with IV metoprolol. She did get her second COVID vaccine on 01/19/20. There were no other specific triggers that the patient could identify. She is tolerating the medication without difficulty. Prior to this episode, she has had very little symptoms of afib.   Today, she denies symptoms of palpitations, chest pain, shortness of breath, orthopnea, PND, lower extremity edema, dizziness, presyncope, syncope, snoring, daytime somnolence, bleeding, or neurologic sequela. The patient is tolerating medications without difficulties and is otherwise without complaint today.    Atrial Fibrillation Risk Factors:  she does not have symptoms or diagnosis of sleep apnea. she does not have a history of rheumatic fever. she does not have a history of alcohol use. The patient does not have a history of early familial atrial fibrillation or other arrhythmias.  she has a BMI of Body mass index is 38.3 kg/m.[name] Filed Weights   01/25/20 1343    Weight: 111.6 kg    Family History  Problem Relation Age of Onset  . Heart disease Father   . Diabetes Father   . Pancreatic cancer Mother   . Diabetes Mother   . Hypertension Sister        x 3  . Diabetes Sister        x 4  . Diabetes Maternal Grandmother   . Diabetes Paternal Grandmother   . Diabetes Sister   . Diabetes Sister   . Diabetes Sister   . Kidney disease Sister   . Colon cancer Neg Hx   . Colon polyps Neg Hx   . Rectal cancer Neg Hx   . Stomach cancer Neg Hx   . Esophageal cancer Neg Hx   . Breast cancer Neg Hx      Atrial Fibrillation Management history:  Previous antiarrhythmic drugs: dofetilide Previous cardioversions: none Previous ablations: none CHADS2VASC score: 4 (female, CAD, HTN, DM) Anticoagulation history: Eliquis   Past Medical History:  Diagnosis Date  . Allergy   . Anemia   . Anemia   . Aortic insufficiency    a. Mod by echo 05/2015.  . Arthritis    "knees" (07/11/2015)  . Atrial fibrillation (HCC)    On Eliquis  . Carcinoma of hilus of lung ([place]) 01/20/2012   IIIB NSCL  Right hilar mass compressing esophagus/presenting with dysphagia Rx Surgery/RT/chemo dx January 2001  . Complication of anesthesia    "I had a hard time waking me up w/one of my shoulder surgeries"  . Coronary artery disease    a. Abnormal stress test -> LHc 06/2015 s/p DES to mCX and mLAD, residual D2 disease treated medially.  . Diverticulitis   . Elevated blood pressure   .  Erythema nodosum   . Heart murmur   . Insomnia   . lung ca dx'd 10/1999   chemo/xrt comp 05/29/2000  . Myalgia   . Nephrolithiasis 2015  . Obesity   . Pericardial effusion    a. 05/2015 Echo: EF 55-60%, no rwma, Gr 1 DD, no effusion - but an effusion was seen on CT which was felt to be increased in size compared to 12/2014 - pericardium also thickened.   . Pericarditis    a. Dx 05/2015.  [name] Pneumothorax, spontaneous, tension 01/20/2012   October, 2010  . Pulmonary fibrosis ([place]) 01/20/2012    Due to previous surgery and chest radiation for lung cancer  . Restrictive lung disease   . Type II diabetes mellitus ([place])    Past Surgical History:  Procedure Laterality Date  . ABDOMINAL HYSTERECTOMY  2008  . APPENDECTOMY  1969?  [name] CARDIAC CATHETERIZATION N/A 07/11/2015   Procedure: Left Heart Cath and Coronary Angiography;  Surgeon: Jettie Booze, MD;  Location: [facility];  Service: Cardiovascular;  Laterality: N/A;  . CARDIAC CATHETERIZATION  07/11/2015   Procedure: Coronary Stent Intervention;  Surgeon: Jettie Booze, MD;  Location: [facility];  Service: Cardiovascular;;  . CARPAL TUNNEL RELEASE Bilateral 1998-2005?   right-left  . CARPAL TUNNEL RELEASE Left 2009?   "for the 2nd time"  . COLONOSCOPY    . COLONOSCOPY WITH PROPOFOL N/A 05/28/2018   Procedure: COLONOSCOPY WITH PROPOFOL;  Surgeon: Milus Banister, MD;  Location: [facility];  Service: Endoscopy;  Laterality: N/A;  . CORONARY ANGIOPLASTY    . CYSTOSCOPY W/ STONE MANIPULATION  2013  . ESOPHAGOGASTRODUODENOSCOPY (EGD) WITH ESOPHAGEAL DILATION  2012  . FOOT SURGERY Bilateral 1980's   Callous removed   . INNER EAR SURGERY Right ~ 2009  . KNEE ARTHROPLASTY Right 1991  . KNEE ARTHROSCOPY  ~ 2000   LATERAL RELEASE  . LEFT HEART CATH AND CORONARY ANGIOGRAPHY N/A 12/18/2016   Procedure: Left Heart Cath and Coronary Angiography;  Surgeon: Burnell Blanks, MD;  Location: [facility];  Service: Cardiovascular;  Laterality: N/A;  . LUNG CANCER SURGERY Left 2001   "small cell"  . LUNG SURGERY Right 2007   "reinflated it"  . PULMONARY EMBOLISM SURGERY  2007   LUNG COLLAPSE  . RIGHT AND LEFT HEART CATH N/A 06/25/2017   Procedure: RIGHT AND LEFT HEART CATH;  Surgeon: Sherren Mocha, MD;  Location: [facility];  Service: Cardiovascular;  Laterality: N/A;  . SHOULDER ADHESION RELEASE Right ~ 2004  . SHOULDER ARTHROSCOPY W/ ROTATOR CUFF REPAIR Right ~ 2003    Current Outpatient  Medications  Medication Sig Dispense Refill  . Accu-Chek Softclix Lancets lancets USE LANCETS 3 TIMES A DAY AS DIRECTED 30 DAYS    . B-D UF III MINI PEN NEEDLES 31G X 5 MM MISC 2 (two) times daily. as directed    . Blood Glucose Monitoring Suppl (ACCU-CHEK GUIDE) w/Device KIT See admin instructions.    [name] co-enzyme Q-10 50 MG capsule TAKE 1 CAPSULE BY MOUTH EVERY DAY 30 capsule 7  . diltiazem (CARDIZEM CD) 360 MG 24 hr capsule TAKE 1 CAPSULE BY MOUTH EVERY DAY 90 capsule 2  . dofetilide (TIKOSYN) 500 MCG capsule Take 1 capsule (500 mcg total) by mouth 2 (two) times daily. 180 capsule 3  . ELIQUIS 5 MG TABS tablet TAKE 1 TABLET BY MOUTH TWICE A DAY 749 tablet 2  . folic acid (FOLVITE) 1 MG tablet Take  1 mg by mouth daily.    . furosemide (LASIX) 40 MG tablet Take 40 mg by mouth as needed.    [name] HUMALOG MIX 75/25 KWIKPEN (75-25) 100 UNIT/ML Kwikpen Inject 30-40 Units into the skin See admin instructions. Inject 40 units SQ in Am and 35 units SQ in PM  6  . levothyroxine (SYNTHROID, LEVOTHROID) 50 MCG tablet Take 50 mcg by mouth daily before breakfast.   6  . losartan (COZAAR) 25 MG tablet Take 1 tablet (25 mg total) by mouth daily. Please schedule annual appt with Dr. [name] for refills. [phone]. 1st attempt. 30 tablet 0  . methotrexate (RHEUMATREX) 2.5 MG tablet Taking by mouth  96m in the am and 5 mg at night    . metoprolol tartrate (LOPRESSOR) 25 MG tablet TAKE 1 TABLET (25 MG TOTAL) BY MOUTH 2 (TWO) TIMES DAILY. 180 tablet 2  . nitroGLYCERIN (NITROSTAT) 0.4 MG SL tablet Place 1 tablet (0.4 mg total) under the tongue every 5 (five) minutes as needed for chest pain. 25 tablet 3  . ONE TOUCH ULTRA TEST test strip USE AS DIRECTED 3 TIMES A DAY    . OZEMPIC, 0.25 OR 0.5 MG/DOSE, 2 MG/1.5ML SOPN INJECT 0.5 MG INTO THE SKIN ONCE A WEEK. (Patient taking differently: 0.25 mg. ) 3 pen 3  . pravastatin (PRAVACHOL) 20 MG tablet Take 1 tablet (20 mg total) by mouth daily. 90 tablet 2  . spironolactone  (ALDACTONE) 25 MG tablet TAKE 1 TABLET BY MOUTH EVERY DAY 90 tablet 3   No current facility-administered medications for this encounter.    Allergies  Allergen Reactions  . Hydrocodone Hives and Itching  . Hydrocodone-Acetaminophen Hives  . Morphine And Related Other (See Comments)    GI PROBLEMS  . Rosuvastatin Other (See Comments)    Pt reports causes lower extremity muscle aches/cramping  . Lisinopril Cough  . Nsaids Other (See Comments)    GI PROBLEMS    Social History   Socioeconomic History  . Marital status: Single    Spouse name: Not on file  . Number of children: 0  . Years of education: Not on file  . Highest education level: Not on file  Occupational History  . Occupation: RETIRED  Tobacco Use  . Smoking status: Former Smoker    Packs/day: 0.25    Years: 5.00    Pack years: 1.25    Types: Cigarettes    Quit date: 07/22/1999    Years since quitting: 20.5  . Smokeless tobacco: Never Used  Substance and Sexual Activity  . Alcohol use: No    Alcohol/week: 0.0 standard drinks  . Drug use: No  . Sexual activity: Not Currently    Birth control/protection: Other-see comments    Comment: hysterectomy  Other Topics Concern  . Not on file  Social History Narrative  . Not on file   Social Determinants of Health   Financial Resource Strain:   . Difficulty of Paying Living Expenses:   Food Insecurity:   . Worried About RCharity fundraiserin the Last Year:   . RArboriculturistin the Last Year:   Transportation Needs:   . LFilm/video editor(Medical):   .Marland KitchenLack of Transportation (Non-Medical):   Physical Activity:   . Days of Exercise per Week:   . Minutes of Exercise per Session:   Stress:   . Feeling of Stress :   Social Connections:   . Frequency of Communication with Friends and Family:   .  Frequency of Social Gatherings with Friends and Family:   . Attends Religious Services:   . Active Member of Clubs or Organizations:   . Attends Theatre manager Meetings:   [name] Marital Status:   Intimate Partner Violence:   . Fear of Current or Ex-Partner:   . Emotionally Abused:   [name] Physically Abused:   . Sexually Abused:      ROS- All systems are reviewed and negative except as per the HPI above.  Physical Exam: Vitals:   01/25/20 1343  BP: 140/66  Pulse: (!) 102  Weight: 111.6 kg  Height: 5' 7.2" (1.707 m)    GEN- The patient is well appearing obese female, alert and oriented x 3 today.   HEENT-head normocephalic, atraumatic, sclera clear, conjunctiva pink, hearing intact, trachea midline. Lungs- Clear to ausculation bilaterally, normal work of breathing Heart- Regular rate and rhythm, tachycardia, no rubs or gallops. 3/6 systolic murmur.  GI- soft, NT, ND, + BS Extremities- no clubbing, cyanosis, or edema MS- no significant deformity or atrophy Skin- no rash or lesion Psych- euthymic mood, full affect Neuro- strength and sensation are intact   Wt Readings from Last 3 Encounters:  01/25/20 111.6 kg  01/25/20 111.2 kg  01/23/20 109.8 kg    EKG today demonstrates ST HR 102, PR 138, QRS 88, QTc 477  Echo 01/28/19 demonstrated   1. The left ventricle has hyperdynamic systolic function, with an ejection fraction of >65%. The cavity size was normal. There is moderately increased left ventricular wall thickness. Left ventricular diastolic Doppler parameters are consistent with  pseudonormalization. Elevated left atrial and left ventricular end-diastolic pressures The E/e' is >15. No evidence of left ventricular regional wall motion abnormalities.  2. The right ventricle has normal systolic function. The cavity was normal. There is no increase in right ventricular wall thickness.  3. The mitral valve is grossly normal.  4. The tricuspid valve is grossly normal.  5. The aortic valve is tricuspid. Mild sclerosis of the aortic valve. Aortic valve regurgitation is mild to moderate by color flow Doppler. mildly increased  gradient without stenosis of the aortic valve.  6. The aortic root and ascending aorta are normal in size and structure.  7. The inferior vena cava was dilated in size with <50% respiratory variability.  Epic records are reviewed at length today  Assessment and Plan:  1. Paroxysmal atrial fibrillation Patient had recent episode of afib, converted with IV metoprolol. ? Related to COVID vaccine. Patient has done well with dofetilide over the last year. Will not make changes today. Her AAD options are limited given CAD and lung disease. If her afib becomes more persistent she may be a candidate for ablation. Continue dofetilide 500 mg BID. QT stable. Continue diltiazem 240 mg daily and Lopressor 25 mg BID. Continue Eliquis 5 mg BID Recheck bmet today given K+ 3.7 at ED.  This patients CHA2DS2-VASc Score and unadjusted Ischemic Stroke Rate (% per year) is equal to 4.8 % stroke rate/year from a score of 4  Above score calculated as 1 point each if present [CHF, HTN, DM, Vascular=MI/PAD/Aortic Plaque, Age if 65-74, or Female] Above score calculated as 2 points each if present [Age > 75, or Stroke/TIA/TE]  2. Obesity Body mass index is 38.3 kg/m. Lifestyle modification was discussed and encouraged including regular physical activity and weight reduction.  3. CAD No anginal symptoms.  4. HTN Stable, no changes today.   Follow up with Dr Curt Bears in 3 months.  [facility] [address] [place], [place] [address] [phone] 01/25/2020 4:11 PM

## 2020-01-25 NOTE — Progress Notes (Signed)
This visit occurred during the SARS-CoV-2 public health emergency.  Safety protocols were in place, including screening questions prior to the visit, additional usage of staff PPE, and extensive cleaning of exam room while observing appropriate contact time as indicated for disinfecting solutions.  Subjective:     Patient ID: [name] , female    DOB: June 10, 1956 , 64 y.o.   MRN: [id number]   Chief Complaint  Patient presents with  . Atrial Fibrillation    HPI  She presents today for ER f/u.  She presented to ER on Easter Sunday for further evaluation of palpitations and chest discomfort. She has h/o afib and reports compliance with meds, including Eliquis.  The patient was celebrating Easter service at [employer].  She got in the car to go drop off a care package at friend's house and when she got back into her car she "felt the tightness she feels when her A. fib acts up", had some chest pain and began to feel her heart racing.  Patient states that she went home to change her clothes, sat down and her sx did not improve.  Therefore, she decided to drive herself to the ER.  She states that her symptoms began at 12:45 PM and persisted throughout the afternoon.  She denies any recent cough, fever, urinary symptoms. Initial EKG showed HR in 180s. She was given iv metoprolol which resulted in a decrease in HR. CXR was positive for perihilar opacity. She has been followed by Dr. Elsworth Soho, Pulmonary in the past.   Atrial Fibrillation Presents for follow-up visit. Past medical history includes atrial fibrillation.     Past Medical History:  Diagnosis Date  . Allergy   . Anemia   . Anemia   . Aortic insufficiency    a. Mod by echo 05/2015.  . Arthritis    "knees" (07/11/2015)  . Atrial fibrillation (HCC)    On Eliquis  . Carcinoma of hilus of lung ([place]) 01/20/2012   IIIB NSCL  Right hilar mass compressing esophagus/presenting with dysphagia Rx Surgery/RT/chemo dx January 2001  . Complication of  anesthesia    "I had a hard time waking me up w/one of my shoulder surgeries"  . Coronary artery disease    a. Abnormal stress test -> LHc 06/2015 s/p DES to mCX and mLAD, residual D2 disease treated medially.  . Diverticulitis   . Elevated blood pressure   . Erythema nodosum   . Heart murmur   . Insomnia   . lung ca dx'd 10/1999   chemo/xrt comp 05/29/2000  . Myalgia   . Nephrolithiasis 2015  . Obesity   . Pericardial effusion    a. 05/2015 Echo: EF 55-60%, no rwma, Gr 1 DD, no effusion - but an effusion was seen on CT which was felt to be increased in size compared to 12/2014 - pericardium also thickened.   . Pericarditis    a. Dx 05/2015.  [name] Pneumothorax, spontaneous, tension 01/20/2012   October, 2010  . Pulmonary fibrosis ([place]) 01/20/2012   Due to previous surgery and chest radiation for lung cancer  . Restrictive lung disease   . Type II diabetes mellitus (HCC)      Family History  Problem Relation Age of Onset  . Heart disease Father   . Diabetes Father   . Pancreatic cancer Mother   . Diabetes Mother   . Hypertension Sister        x 3  . Diabetes Sister  x 4  . Diabetes Maternal Grandmother   . Diabetes Paternal Grandmother   . Diabetes Sister   . Diabetes Sister   . Diabetes Sister   . Kidney disease Sister   . Colon cancer Neg Hx   . Colon polyps Neg Hx   . Rectal cancer Neg Hx   . Stomach cancer Neg Hx   . Esophageal cancer Neg Hx   . Breast cancer Neg Hx      Current Outpatient Medications:  .  B-D UF III MINI PEN NEEDLES 31G X 5 MM MISC, 2 (two) times daily. as directed, Disp: , Rfl:  .  co-enzyme Q-10 50 MG capsule, TAKE 1 CAPSULE BY MOUTH EVERY DAY, Disp: 30 capsule, Rfl: 7 .  diltiazem (CARDIZEM CD) 360 MG 24 hr capsule, TAKE 1 CAPSULE BY MOUTH EVERY DAY, Disp: 90 capsule, Rfl: 2 .  dofetilide (TIKOSYN) 500 MCG capsule, Take 1 capsule (500 mcg total) by mouth 2 (two) times daily., Disp: 180 capsule, Rfl: 3 .  ELIQUIS 5 MG TABS tablet, TAKE 1 TABLET  BY MOUTH TWICE A DAY, Disp: 180 tablet, Rfl: 2 .  folic acid (FOLVITE) 1 MG tablet, Take 1 mg by mouth daily., Disp: , Rfl:  .  furosemide (LASIX) 40 MG tablet, Take 40 mg by mouth as needed., Disp: , Rfl:  .  HUMALOG MIX 75/25 KWIKPEN (75-25) 100 UNIT/ML Kwikpen, Inject 30-40 Units into the skin See admin instructions. Inject 40 units SQ in Am and 35 units SQ in PM, Disp: , Rfl: 6 .  levothyroxine (SYNTHROID, LEVOTHROID) 50 MCG tablet, Take 50 mcg by mouth daily before breakfast. , Disp: , Rfl: 6 .  losartan (COZAAR) 25 MG tablet, Take 1 tablet (25 mg total) by mouth daily. Please schedule annual appt with Dr. [name] for refills. [phone]. 1st attempt., Disp: 30 tablet, Rfl: 0 .  methotrexate (RHEUMATREX) 2.5 MG tablet, Take 25 mg by mouth once a week., Disp: , Rfl:  .  metoprolol tartrate (LOPRESSOR) 25 MG tablet, TAKE 1 TABLET (25 MG TOTAL) BY MOUTH 2 (TWO) TIMES DAILY., Disp: 180 tablet, Rfl: 2 .  nitroGLYCERIN (NITROSTAT) 0.4 MG SL tablet, Place 1 tablet (0.4 mg total) under the tongue every 5 (five) minutes as needed for chest pain., Disp: 25 tablet, Rfl: 3 .  ONE TOUCH ULTRA TEST test strip, USE AS DIRECTED 3 TIMES A DAY, Disp: , Rfl:  .  OZEMPIC, 0.25 OR 0.5 MG/DOSE, 2 MG/1.5ML SOPN, INJECT 0.5 MG INTO THE SKIN ONCE A WEEK. (Patient taking differently: 0.25 mg. ), Disp: 3 pen, Rfl: 3 .  pravastatin (PRAVACHOL) 20 MG tablet, Take 1 tablet (20 mg total) by mouth daily., Disp: 90 tablet, Rfl: 2 .  spironolactone (ALDACTONE) 25 MG tablet, TAKE 1 TABLET BY MOUTH EVERY DAY, Disp: 90 tablet, Rfl: 3   Allergies  Allergen Reactions  . Hydrocodone Hives and Itching  . Hydrocodone-Acetaminophen Hives  . Morphine And Related Other (See Comments)    GI PROBLEMS  . Rosuvastatin Other (See Comments)    Pt reports causes lower extremity muscle aches/cramping  . Lisinopril Cough  . Nsaids Other (See Comments)    GI PROBLEMS     Review of Systems  Constitutional: Negative.   Respiratory:  Negative.   Cardiovascular: Negative.   Gastrointestinal: Negative.   Neurological: Negative.   Psychiatric/Behavioral: Negative.      Today's Vitals   01/25/20 0908  BP: 128/64  Pulse: (!) 104  Temp: 98 F (36.7 C)  Weight:  245 lb 3.2 oz (111.2 kg)  Height: 5' 7.2" (1.707 m)   Body mass index is 38.18 kg/m.   Objective:  Physical Exam Vitals and nursing note reviewed.  Constitutional:      Appearance: Normal appearance.  HENT:     Head: Normocephalic and atraumatic.  Cardiovascular:     Rate and Rhythm: Normal rate and regular rhythm.     Heart sounds: Normal heart sounds.  Pulmonary:     Effort: Pulmonary effort is normal.     Breath sounds: Normal breath sounds.  Skin:    General: Skin is warm.  Neurological:     General: No focal deficit present.     Mental Status: She is alert.  Psychiatric:        Mood and Affect: Mood normal.        Behavior: Behavior normal.         Assessment And Plan:     1. Chronic atrial fibrillation [facility])  ER records reviewed in full detail during her visit. Chronic, intermittent. She is in sinus rhythm at this time, but heart rate is elevated. Importance of medication compliance was discussed with the patient. All questions were answered to her satisfaction.   2. Hypertensive heart and renal disease with renal failure, stage 1 through stage 4 or unspecified chronic kidney disease, without heart failure  Chronic, well controlled. She is encouraged to avoid adding salt to her foods. She is encouraged to limit her salt intake.   3. Abnormal chest x-ray  She agrees to CT chest w/ contrast. I will also refer her to Pulmonary for further evaluation.   - Ambulatory referral to Pulmonology - CT Chest W Contrast; Future  4. Class 2 severe obesity due to excess calories with serious comorbidity and body mass index (BMI) of 38.0 to 38.9 in adult [facility])  She is encouraged to strive for BMI less than 32 to decrease cardiac risk. She is  advised to cut back on processed foods, sugary beverages and refined carbs. She will exercise as tolerated.    [name], MD    THE PATIENT IS ENCOURAGED TO PRACTICE SOCIAL DISTANCING DUE TO THE COVID-19 PANDEMIC.

## 2020-01-25 NOTE — Patient Instructions (Signed)

## 2020-01-26 ENCOUNTER — Telehealth: Payer: Self-pay

## 2020-01-26 ENCOUNTER — Encounter (HOSPITAL_COMMUNITY): Payer: Self-pay | Admitting: *Deleted

## 2020-01-26 NOTE — Telephone Encounter (Signed)
Called to let her know kidney fxn has decreased. this needs to be repeated prior to having CT scan. needs to drink plenty of water day before, day of, and day after CT scan

## 2020-02-03 ENCOUNTER — Other Ambulatory Visit: Payer: Self-pay

## 2020-02-03 ENCOUNTER — Other Ambulatory Visit: Payer: Medicare PPO

## 2020-02-03 DIAGNOSIS — N289 Disorder of kidney and ureter, unspecified: Secondary | ICD-10-CM

## 2020-02-04 LAB — BMP8+EGFR
BUN/Creatinine Ratio: 19 (ref 12–28)
BUN: 20 mg/dL (ref 8–27)
CO2: 25 mmol/L (ref 20–29)
Calcium: 9.2 mg/dL (ref 8.7–10.3)
Chloride: 107 mmol/L — ABNORMAL HIGH (ref 96–106)
Creatinine, Ser: 1.06 mg/dL — ABNORMAL HIGH (ref 0.57–1.00)
GFR calc Af Amer: 65 mL/min/{1.73_m2} (ref >59)
GFR calc non Af Amer: 57 mL/min/{1.73_m2} — ABNORMAL LOW (ref >59)
Glucose: 103 mg/dL — ABNORMAL HIGH (ref 65–99)
Potassium: 5.2 mmol/L (ref 3.5–5.2)
Sodium: 144 mmol/L (ref 134–144)

## 2020-02-09 ENCOUNTER — Ambulatory Visit
Admission: RE | Admit: 2020-02-09 | Discharge: 2020-02-09 | Disposition: A | Discharge status: Home / Self Care | Payer: Medicare PPO | Source: Ambulatory Visit | Attending: Internal Medicine | Admitting: Internal Medicine

## 2020-02-09 ENCOUNTER — Other Ambulatory Visit: Payer: Self-pay

## 2020-02-09 DIAGNOSIS — R918 Other nonspecific abnormal finding of lung field: Secondary | ICD-10-CM | POA: Diagnosis not present

## 2020-02-09 DIAGNOSIS — J439 Emphysema, unspecified: Secondary | ICD-10-CM | POA: Diagnosis not present

## 2020-02-09 DIAGNOSIS — R9389 Abnormal findings on diagnostic imaging of other specified body structures: Secondary | ICD-10-CM

## 2020-02-09 MED ORDER — IOPAMIDOL (ISOVUE-300) INJECTION 61%
100.0000 mL | Freq: Once | INTRAVENOUS | Status: AC | PRN
  Administered 2020-02-09: 75 mL via INTRAVENOUS

## 2020-02-14 ENCOUNTER — Ambulatory Visit: Payer: Medicare PPO | Admitting: Adult Health

## 2020-02-14 ENCOUNTER — Other Ambulatory Visit: Payer: Self-pay

## 2020-02-14 ENCOUNTER — Encounter: Payer: Self-pay | Admitting: Adult Health

## 2020-02-14 VITALS — BP 98/56 | HR 64 | Temp 97.9°F | Ht 67.0 in | Wt 246.2 lb

## 2020-02-14 DIAGNOSIS — R400 Somnolence: Secondary | ICD-10-CM | POA: Diagnosis not present

## 2020-02-14 DIAGNOSIS — C3400 Malignant neoplasm of unspecified main bronchus: Secondary | ICD-10-CM

## 2020-02-14 DIAGNOSIS — J984 Other disorders of lung: Secondary | ICD-10-CM | POA: Diagnosis not present

## 2020-02-14 NOTE — Patient Instructions (Addendum)
Set up for Sleep study .  Continue on current regimen  Follow up with Dr. Elsworth Soho  In 6 months and As needed.

## 2020-02-14 NOTE — Assessment & Plan Note (Signed)
Appears to be stable  Continue with activity as tolerated.   Plan  Patient Instructions  Set up for Sleep study .  Continue on current regimen  Follow up with Dr. Elsworth Soho  In 6 months and As needed.

## 2020-02-14 NOTE — Assessment & Plan Note (Signed)
Daytime sleepiness, risk factors for OSA with BMI , snoring , A Fib  Proceed with Home sleep study.

## 2020-02-14 NOTE — Assessment & Plan Note (Signed)
Recent CT chest stable post op changes. No lung mass noted.

## 2020-02-14 NOTE — Progress Notes (Signed)
_0  ID: [name], female    DOB: Jul 10, 1956, 64 y.o.   MRN: [id number]  Chief Complaint  Patient presents with  . Follow-up    Dyspnea     Referring provider: Glendale Chard, MD  HPI: 65 year old female former smoker ( 1 Pack year)  smoker followed for chronic dyspnea attributed to restrictive lung disease. Patient has a complicated medical history with previous Right upper lobectomy for spontaneous pneumothorax/pleural abrasions  in 2007 (Dr. Roxan Hockey-) .  She has a known history of stage IIIb lung cancer in 2001 status post chemo and XRT.and Left upper lobectomy with lymph node dissection with Dr. Arlyce Dice -Considered in remission Chronic pericarditis since 2013 with previous treatments including nonsteroidals, colchicine, steroids -previous evaluation at [facility] Coronary artery disease, diastolic dysfunction, diabetes, A. fib on anticoagulation   TEST/EVENTS :  CT chest February 09, 2020 no mediastinal or hilar adenopathy, left apical consolidation similar to 2018, no notable right-sided lung mass.  PFTs 2016 FVC 55%  FEV1 52% ratio 94  02/14/2020 Follow up : Dyspnea , restrictive lung disease Patient returns for a 1 year follow-up.  Patient has known history of restrictive lung disease.  She has had previous lung cancer status post chemo and radiation-2001.  She also had a left upper lobectomy in 2001 for lung cancer . RUL lobectomy in 2007 for PTX>. Was in ER earlier this month with recurrent A Fib RVR . Chest xray was abnormal with RUL mass. Subsequent CT chest on February 09, 2020 showed stable left apical consolidation from prior treatment, no notable right lung mass noted. Since last visit patient says she is doing okay , Gets winded with heavy activity .  Does activities at her pace.  No increased cough , no rash , hemoptysis or increased leg swelling .   As above has A Fib , on Tikosyn and Eliquis .  Wakes up very tired, restless sleep  And snoring . BMI 38.  + daytime sleepiness.  Epworth is 2 .   Now on Methotrexate for Iritis .(Left)  Says she underwent autoimmune workup which was unrevealing per patient .   Has received both Covid vaccines    Allergies  Allergen Reactions  . Hydrocodone Hives and Itching  . Hydrocodone-Acetaminophen Hives  . Morphine And Related Other (See Comments)    GI PROBLEMS  . Rosuvastatin Other (See Comments)    Pt reports causes lower extremity muscle aches/cramping  . Lisinopril Cough  . Nsaids Other (See Comments)    GI PROBLEMS    Immunization History  Administered Date(s) Administered  . Influenza Whole 07/21/2009, 07/29/2010  . Influenza,inj,Quad PF,6+ Mos 11/23/2015  . Influenza-Unspecified 08/14/2016, 06/16/2019  . PFIZER SARS-COV-2 Vaccination 12/24/2019, 01/19/2020  . PPD Test 06/19/2013  . Pneumococcal Conjugate-13 04/18/2015  . Pneumococcal Polysaccharide-23 07/29/2010, 06/16/2019    Past Medical History:  Diagnosis Date  . Allergy   . Anemia   . Anemia   . Aortic insufficiency    a. Mod by echo 05/2015.  . Arthritis    "knees" (07/11/2015)  . Atrial fibrillation (HCC)    On Eliquis  . Carcinoma of hilus of lung ([place]) 01/20/2012   IIIB NSCL  Right hilar mass compressing esophagus/presenting with dysphagia Rx Surgery/RT/chemo dx January 2001  . Complication of anesthesia    "I had a hard time waking me up w/one of my shoulder surgeries"  . Coronary artery disease    a. Abnormal stress test -> LHc 06/2015 s/p DES to [facility]  and mLAD, residual D2 disease treated medially.  . Diverticulitis   . Elevated blood pressure   . Erythema nodosum   . Heart murmur   . Insomnia   . lung ca dx'd 10/1999   chemo/xrt comp 05/29/2000  . Myalgia   . Nephrolithiasis 2015  . Obesity   . Pericardial effusion    a. 05/2015 Echo: EF 55-60%, no rwma, Gr 1 DD, no effusion - but an effusion was seen on CT which was felt to be increased in size compared to 12/2014 - pericardium also thickened.   . Pericarditis     a. Dx 05/2015.  [name] Pneumothorax, spontaneous, tension 01/20/2012   October, 2010  . Pulmonary fibrosis ([place]) 01/20/2012   Due to previous surgery and chest radiation for lung cancer  . Restrictive lung disease   . Type II diabetes mellitus (HCC)     Tobacco History: Social History   Tobacco Use  Smoking Status Former Smoker  . Packs/day: 0.25  . Years: 5.00  . Pack years: 1.25  . Types: Cigarettes  . Quit date: 07/22/1999  . Years since quitting: 20.5  Smokeless Tobacco Never Used   Counseling given: Not Answered   Outpatient Medications Prior to Visit  Medication Sig Dispense Refill  . Accu-Chek Softclix Lancets lancets USE LANCETS 3 TIMES A DAY AS DIRECTED 30 DAYS    . B-D UF III MINI PEN NEEDLES 31G X 5 MM MISC 2 (two) times daily. as directed    . Blood Glucose Monitoring Suppl (ACCU-CHEK GUIDE) w/Device KIT See admin instructions.    [name] co-enzyme Q-10 50 MG capsule TAKE 1 CAPSULE BY MOUTH EVERY DAY 30 capsule 7  . diltiazem (CARDIZEM CD) 360 MG 24 hr capsule TAKE 1 CAPSULE BY MOUTH EVERY DAY 90 capsule 2  . dofetilide (TIKOSYN) 500 MCG capsule Take 1 capsule (500 mcg total) by mouth 2 (two) times daily. 180 capsule 3  . ELIQUIS 5 MG TABS tablet TAKE 1 TABLET BY MOUTH TWICE A DAY 818 tablet 2  . folic acid (FOLVITE) 1 MG tablet Take 1 mg by mouth daily.    . furosemide (LASIX) 40 MG tablet Take 40 mg by mouth as needed.    [name] HUMALOG MIX 75/25 KWIKPEN (75-25) 100 UNIT/ML Kwikpen Inject 30-40 Units into the skin See admin instructions. Inject 40 units SQ in Am and 35 units SQ in PM  6  . levothyroxine (SYNTHROID, LEVOTHROID) 50 MCG tablet Take 50 mcg by mouth daily before breakfast.   6  . losartan (COZAAR) 25 MG tablet Take 1 tablet (25 mg total) by mouth daily. Please schedule annual appt with Dr. [name] for refills. [phone]. 1st attempt. 30 tablet 0  . methotrexate (RHEUMATREX) 2.5 MG tablet Taking by mouth  60m in the am and 5 mg at night    . metoprolol tartrate  (LOPRESSOR) 25 MG tablet TAKE 1 TABLET (25 MG TOTAL) BY MOUTH 2 (TWO) TIMES DAILY. 180 tablet 2  . nitroGLYCERIN (NITROSTAT) 0.4 MG SL tablet Place 1 tablet (0.4 mg total) under the tongue every 5 (five) minutes as needed for chest pain. 25 tablet 3  . ONE TOUCH ULTRA TEST test strip USE AS DIRECTED 3 TIMES A DAY    . OZEMPIC, 0.25 OR 0.5 MG/DOSE, 2 MG/1.5ML SOPN INJECT 0.5 MG INTO THE SKIN ONCE A WEEK. (Patient taking differently: 0.25 mg. ) 3 pen 3  . pravastatin (PRAVACHOL) 20 MG tablet Take 1 tablet (20 mg total) by mouth daily. 90 tablet  2  . spironolactone (ALDACTONE) 25 MG tablet TAKE 1 TABLET BY MOUTH EVERY DAY 90 tablet 3   No facility-administered medications prior to visit.     Review of Systems:   Constitutional:   No  weight loss, night sweats,  Fevers, chills,  +fatigue, or  lassitude.  HEENT:   No headaches,  Difficulty swallowing,  Tooth/dental problems, or  Sore throat,                No sneezing, itching, ear ache, nasal congestion, post nasal drip,   CV:  No chest pain,  Orthopnea, PND, swelling in lower extremities, anasarca, dizziness, palpitations, syncope.   GI  No heartburn, indigestion, abdominal pain, nausea, vomiting, diarrhea, change in bowel habits, loss of appetite, bloody stools.   Resp:    No chest wall deformity  Skin: no rash or lesions.  GU: no dysuria, change in color of urine, no urgency or frequency.  No flank pain, no hematuria   MS:  No joint pain or swelling.  No decreased range of motion.  No back pain.    Physical Exam  BP (!) 98/56 (BP Location: Left Arm, Cuff Size: Normal)   Pulse 64   Temp 97.9 F (36.6 C) (Temporal)   Ht _0  (1.702 m)   Wt 246 lb 3.2 oz (111.7 kg)   SpO2 98%   BMI 38.56 kg/m   GEN: A/Ox3; pleasant , NAD, BMI 38    HEENT:  [place]/AT,  EACs-clear, TMs-wnl, NOSE-clear, THROAT-clear, no lesions, no postnasal drip or exudate noted.   NECK:  Supple w/ fair ROM; no JVD; normal carotid impulses w/o bruits; no  thyromegaly or nodules palpated; no lymphadenopathy.    RESP  Clear  P & A; w/o, wheezes/ rales/ or rhonchi. no accessory muscle use, no dullness to percussion  CARD:  RRR, no m/r/g, no peripheral edema, pulses intact, no cyanosis or clubbing.  GI:   Soft & nt; nml bowel sounds; no organomegaly or masses detected.   Musco: Warm bil, no deformities or joint swelling noted.   Neuro: alert, no focal deficits noted.    Skin: Warm, no lesions or rashes    Lab Results:   BMET   BNP  ProBNP  Imaging: DG Chest 2 View  Result Date: 01/23/2020 CLINICAL DATA:  Chest pain EXAM: CHEST - 2 VIEW COMPARISON:  Mar 13, 2019 FINDINGS: Postoperative change noted in the left upper lobe medially with scarring and pleural thickening. No edema or consolidation evident. There is postoperative change in the right upper lung region. Heart is borderline prominent with pulmonary vascularity normal. There is a rounded opacity in the right perihilar region measuring 3.0 x 3.0 cm. No bone lesions. IMPRESSION: 1. Rounded opacity in the right perihilar region measuring 3 x 3 cm. Advise correlation with contrast enhanced chest CT to assess for potential pulmonary nodular lesion or hilar adenopathy on the right. 2. Postoperative scarring and pleural thickening in the left upper lobe medially and involving the apex. 3.  No edema or airspace opacity. 4.  Stable cardiac prominence. Electronically Signed   By: Lowella Grip III M.D.   On: 01/23/2020 15:25   CT Chest W Contrast  Result Date: 02/10/2020 CLINICAL DATA:  Right-sided radiographic abnormality. History of left-sided therapy for small-cell lung cancer in 2001. EXAM: CT CHEST WITH CONTRAST TECHNIQUE: Multidetector CT imaging of the chest was performed during intravenous contrast administration. CONTRAST:  55m ISOVUE-300 IOPAMIDOL (ISOVUE-300) INJECTION 61% COMPARISON:  Chest radiograph 01/23/2020. Most  recent CT of 03/20/2017. FINDINGS: Cardiovascular: Aortic  atherosclerosis. Tortuous thoracic aorta. Normal heart size with small pericardial effusion, chronic. Lad, left circumflex, and left main coronary artery calcification. No central pulmonary embolism, on this non-dedicated study. Mediastinum/Nodes: No mediastinal or hilar adenopathy. Fluid level in the esophagus on 47/2. Lungs/Pleura: Left hemidiaphragm elevation. No pleural fluid. Centrilobular emphysema. Surgical sutures in the right apex. No evidence of right-sided lung or suprahilar mass. Left apical consolidation with calcification is similar to 2018 and may be treatment related. Left lung base scarring. Upper Abdomen: Normal imaged portions of the liver, spleen, stomach, pancreas, adrenal glands, kidneys. Musculoskeletal: Heterogeneous sclerosis involving the left first through third ribs is chronic and possibly treatment related. IMPRESSION: 1. No evidence of dominant right-sided lung mass or correlate for the plain film abnormality. 2. Treatment effects within both lungs, without thoracic adenopathy or evidence of metastatic disease. 3. Age advanced coronary artery atherosclerosis. Recommend assessment of coronary risk factors and consideration of medical therapy. 4. Aortic atherosclerosis (ICD10-I70.0) and emphysema (ICD10-J43.9). 5. Esophageal air fluid level suggests dysmotility or gastroesophageal reflux. Electronically Signed   By: Abigail Miyamoto M.D.   On: 02/10/2020 12:25      No flowsheet data found.  No results found for: NITRICOXIDE      Assessment & Plan:   Restrictive lung disease Appears to be stable  Continue with activity as tolerated.   Plan  Patient Instructions  Set up for Sleep study .  Continue on current regimen  Follow up with Dr. Elsworth Soho  In 6 months and As needed.       Lung cancer [facility]) Recent CT chest stable post op changes. No lung mass noted.   Daytime sleepiness Daytime sleepiness, risk factors for OSA with BMI , snoring , A Fib  Proceed with Home sleep  study.       Rexene Edison, NP 02/14/2020

## 2020-02-15 DIAGNOSIS — E119 Type 2 diabetes mellitus without complications: Secondary | ICD-10-CM | POA: Diagnosis not present

## 2020-02-15 DIAGNOSIS — H2513 Age-related nuclear cataract, bilateral: Secondary | ICD-10-CM | POA: Diagnosis not present

## 2020-02-15 DIAGNOSIS — Z79899 Other long term (current) drug therapy: Secondary | ICD-10-CM | POA: Diagnosis not present

## 2020-02-15 DIAGNOSIS — H15002 Unspecified scleritis, left eye: Secondary | ICD-10-CM | POA: Diagnosis not present

## 2020-02-15 DIAGNOSIS — H15102 Unspecified episcleritis, left eye: Secondary | ICD-10-CM | POA: Diagnosis not present

## 2020-02-15 DIAGNOSIS — H35033 Hypertensive retinopathy, bilateral: Secondary | ICD-10-CM | POA: Diagnosis not present

## 2020-02-26 ENCOUNTER — Other Ambulatory Visit: Payer: Self-pay | Admitting: Cardiology

## 2020-02-29 ENCOUNTER — Ambulatory Visit: Payer: Medicare Other | Admitting: Internal Medicine

## 2020-03-07 DIAGNOSIS — I10 Essential (primary) hypertension: Secondary | ICD-10-CM | POA: Diagnosis not present

## 2020-03-07 DIAGNOSIS — I2510 Atherosclerotic heart disease of native coronary artery without angina pectoris: Secondary | ICD-10-CM | POA: Diagnosis not present

## 2020-03-07 DIAGNOSIS — E1165 Type 2 diabetes mellitus with hyperglycemia: Secondary | ICD-10-CM | POA: Diagnosis not present

## 2020-03-07 DIAGNOSIS — E039 Hypothyroidism, unspecified: Secondary | ICD-10-CM | POA: Diagnosis not present

## 2020-03-07 DIAGNOSIS — E7800 Pure hypercholesterolemia, unspecified: Secondary | ICD-10-CM | POA: Diagnosis not present

## 2020-03-09 ENCOUNTER — Other Ambulatory Visit: Payer: Self-pay | Admitting: Cardiology

## 2020-03-16 ENCOUNTER — Other Ambulatory Visit: Payer: Self-pay

## 2020-03-16 ENCOUNTER — Ambulatory Visit: Payer: Medicare PPO

## 2020-03-16 DIAGNOSIS — R400 Somnolence: Secondary | ICD-10-CM

## 2020-03-16 DIAGNOSIS — G4733 Obstructive sleep apnea (adult) (pediatric): Secondary | ICD-10-CM | POA: Diagnosis not present

## 2020-03-23 DIAGNOSIS — G4733 Obstructive sleep apnea (adult) (pediatric): Secondary | ICD-10-CM | POA: Diagnosis not present

## 2020-03-29 ENCOUNTER — Telehealth: Payer: Self-pay | Admitting: Pulmonary Disease

## 2020-03-29 NOTE — Telephone Encounter (Signed)
SG can you review sleep study results for RA? I don't see him on [facility].

## 2020-03-29 NOTE — Telephone Encounter (Signed)
Please send to [facility] for review through staff message. Check tomorrow, if he has not read it, then remind me in the morning and I will look when I am there in the morning,.

## 2020-03-29 NOTE — Telephone Encounter (Signed)
I have sent staff msg to Dr Elsworth Soho per SG  Will await result

## 2020-03-31 NOTE — Telephone Encounter (Signed)
Pt is aware that we are awaiting the results from her sleep test.

## 2020-04-04 ENCOUNTER — Telehealth: Payer: Self-pay | Admitting: Cardiology

## 2020-04-04 NOTE — Telephone Encounter (Signed)
*  STAT* If patient is at the pharmacy, call can be transferred to refill team.   1. Which medications need to be refilled? (please list name of each medication and dose if known)  losartan (COZAAR) 25 MG tablet  2. Which pharmacy/location (including street and city if local pharmacy) is medication to be sent to?[id number] - [place], [place] - [address]  3. Do they need a 30 day or 90 day supply? 90 day supply   Patient is completely out of medication and an appointment scheduled for 07/14/20.

## 2020-04-07 ENCOUNTER — Other Ambulatory Visit: Payer: Self-pay | Admitting: Cardiology

## 2020-04-17 ENCOUNTER — Telehealth: Payer: Self-pay | Admitting: Pulmonary Disease

## 2020-04-17 NOTE — Telephone Encounter (Signed)
I have reviewed her home sleep test. Very mild OSA, especially events noted in the supine position.  With this degree, CPAP is not necessary.  She should avoid sleeping in the supine position if possible and sleep on her side as much as possible. Weight loss would certainly help. If symptoms persist we can consider CPAP in the future

## 2020-04-18 NOTE — Telephone Encounter (Signed)
Sleep study results provided to patient per Dr. Elsworth Soho.  She verbalized understanding.  Nothing further needed.

## 2020-04-20 ENCOUNTER — Other Ambulatory Visit: Payer: Self-pay | Admitting: Cardiology

## 2020-05-17 ENCOUNTER — Other Ambulatory Visit: Payer: Self-pay | Admitting: Cardiology

## 2020-06-21 ENCOUNTER — Other Ambulatory Visit: Payer: Self-pay | Admitting: Cardiology

## 2020-06-21 NOTE — Telephone Encounter (Signed)
Prescription refill request for Eliquis received.  Last office visit: [place], 01/25/2020 Scr: 1.32, 01/25/2020 Age: 64 y.o. Weight: 111.7 kg   Prescription refill sent.

## 2020-06-23 ENCOUNTER — Other Ambulatory Visit: Payer: Self-pay | Admitting: Cardiology

## 2020-07-04 DIAGNOSIS — E119 Type 2 diabetes mellitus without complications: Secondary | ICD-10-CM | POA: Diagnosis not present

## 2020-07-04 DIAGNOSIS — M1711 Unilateral primary osteoarthritis, right knee: Secondary | ICD-10-CM | POA: Diagnosis not present

## 2020-07-04 DIAGNOSIS — M25562 Pain in left knee: Secondary | ICD-10-CM | POA: Diagnosis not present

## 2020-07-04 DIAGNOSIS — Z5181 Encounter for therapeutic drug level monitoring: Secondary | ICD-10-CM | POA: Diagnosis not present

## 2020-07-04 DIAGNOSIS — H15102 Unspecified episcleritis, left eye: Secondary | ICD-10-CM | POA: Diagnosis not present

## 2020-07-04 DIAGNOSIS — Z79899 Other long term (current) drug therapy: Secondary | ICD-10-CM | POA: Diagnosis not present

## 2020-07-04 DIAGNOSIS — M25561 Pain in right knee: Secondary | ICD-10-CM | POA: Diagnosis not present

## 2020-07-04 DIAGNOSIS — H35033 Hypertensive retinopathy, bilateral: Secondary | ICD-10-CM | POA: Diagnosis not present

## 2020-07-04 DIAGNOSIS — M170 Bilateral primary osteoarthritis of knee: Secondary | ICD-10-CM | POA: Diagnosis not present

## 2020-07-04 DIAGNOSIS — H15002 Unspecified scleritis, left eye: Secondary | ICD-10-CM | POA: Diagnosis not present

## 2020-07-06 ENCOUNTER — Ambulatory Visit: Payer: Medicare PPO | Admitting: Internal Medicine

## 2020-07-06 ENCOUNTER — Ambulatory Visit (INDEPENDENT_AMBULATORY_CARE_PROVIDER_SITE_OTHER): Payer: Medicare PPO

## 2020-07-06 ENCOUNTER — Encounter: Payer: Self-pay | Admitting: Internal Medicine

## 2020-07-06 ENCOUNTER — Other Ambulatory Visit: Payer: Self-pay

## 2020-07-06 VITALS — BP 136/80 | HR 76 | Temp 98.1°F | Ht 67.0 in | Wt 238.0 lb

## 2020-07-06 VITALS — BP 136/80 | HR 76 | Temp 98.1°F | Ht 67.0 in | Wt 238.4 lb

## 2020-07-06 DIAGNOSIS — E1122 Type 2 diabetes mellitus with diabetic chronic kidney disease: Secondary | ICD-10-CM | POA: Diagnosis not present

## 2020-07-06 DIAGNOSIS — M1712 Unilateral primary osteoarthritis, left knee: Secondary | ICD-10-CM | POA: Diagnosis not present

## 2020-07-06 DIAGNOSIS — I130 Hypertensive heart and chronic kidney disease with heart failure and stage 1 through stage 4 chronic kidney disease, or unspecified chronic kidney disease: Secondary | ICD-10-CM

## 2020-07-06 DIAGNOSIS — I5032 Chronic diastolic (congestive) heart failure: Secondary | ICD-10-CM | POA: Diagnosis not present

## 2020-07-06 DIAGNOSIS — I739 Peripheral vascular disease, unspecified: Secondary | ICD-10-CM

## 2020-07-06 DIAGNOSIS — Z0000 Encounter for general adult medical examination without abnormal findings: Secondary | ICD-10-CM

## 2020-07-06 DIAGNOSIS — Z6837 Body mass index (BMI) 37.0-37.9, adult: Secondary | ICD-10-CM

## 2020-07-06 DIAGNOSIS — M25562 Pain in left knee: Secondary | ICD-10-CM | POA: Diagnosis not present

## 2020-07-06 DIAGNOSIS — Z23 Encounter for immunization: Secondary | ICD-10-CM

## 2020-07-06 DIAGNOSIS — N183 Chronic kidney disease, stage 3 unspecified: Secondary | ICD-10-CM

## 2020-07-06 DIAGNOSIS — Z794 Long term (current) use of insulin: Secondary | ICD-10-CM

## 2020-07-06 MED ORDER — TETANUS-DIPHTH-ACELL PERTUSSIS 5-2.5-18.5 LF-MCG/0.5 IM SUSP: 0.5000 mL | Freq: Once | INTRAMUSCULAR | 0 refills | Status: AC

## 2020-07-06 NOTE — Patient Instructions (Signed)
Diabetes Mellitus and Foot Care Foot care is an important part of your health, especially when you have diabetes. Diabetes may cause you to have problems because of poor blood flow (circulation) to your feet and legs, which can cause your skin to:  Become thinner and drier.  Break more easily.  Heal more slowly.  Peel and crack. You may also have nerve damage (neuropathy) in your legs and feet, causing decreased feeling in them. This means that you may not notice minor injuries to your feet that could lead to more serious problems. Noticing and addressing any potential problems early is the best way to prevent future foot problems. How to care for your feet Foot hygiene  Wash your feet daily with warm water and mild soap. Do not use hot water. Then, pat your feet and the areas between your toes until they are completely dry. Do not soak your feet as this can dry your skin.  Trim your toenails straight across. Do not dig under them or around the cuticle. File the edges of your nails with an emery board or nail file.  Apply a moisturizing lotion or petroleum jelly to the skin on your feet and to dry, brittle toenails. Use lotion that does not contain alcohol and is unscented. Do not apply lotion between your toes. Shoes and socks  Wear clean socks or stockings every day. Make sure they are not too tight. Do not wear knee-high stockings since they may decrease blood flow to your legs.  Wear shoes that fit properly and have enough cushioning. Always look in your shoes before you put them on to be sure there are no objects inside.  To break in new shoes, wear them for just a few hours a day. This prevents injuries on your feet. Wounds, scrapes, corns, and calluses  Check your feet daily for blisters, cuts, bruises, sores, and redness. If you cannot see the bottom of your feet, use a mirror or ask someone for help.  Do not cut corns or calluses or try to remove them with medicine.  If you  find a minor scrape, cut, or break in the skin on your feet, keep it and the skin around it clean and dry. You may clean these areas with mild soap and water. Do not clean the area with peroxide, alcohol, or iodine.  If you have a wound, scrape, corn, or callus on your foot, look at it several times a day to make sure it is healing and not infected. Check for: ? Redness, swelling, or pain. ? Fluid or blood. ? Warmth. ? Pus or a bad smell. General instructions  Do not cross your legs. This may decrease blood flow to your feet.  Do not use heating pads or hot water bottles on your feet. They may burn your skin. If you have lost feeling in your feet or legs, you may not know this is happening until it is too late.  Protect your feet from hot and cold by wearing shoes, such as at the beach or on hot pavement.  Schedule a complete foot exam at least once a year (annually) or more often if you have foot problems. If you have foot problems, report any cuts, sores, or bruises to your health care provider immediately. Contact a health care provider if:  You have a medical condition that increases your risk of infection and you have any cuts, sores, or bruises on your feet.  You have an injury that is not   healing.  You have redness on your legs or feet.  You feel burning or tingling in your legs or feet.  You have pain or cramps in your legs and feet.  Your legs or feet are numb.  Your feet always feel cold.  You have pain around a toenail. Get help right away if:  You have a wound, scrape, corn, or callus on your foot and: ? You have pain, swelling, or redness that gets worse. ? You have fluid or blood coming from the wound, scrape, corn, or callus. ? Your wound, scrape, corn, or callus feels warm to the touch. ? You have pus or a bad smell coming from the wound, scrape, corn, or callus. ? You have a fever. ? You have a red line going up your leg. Summary  Check your feet every day  for cuts, sores, red spots, swelling, and blisters.  Moisturize feet and legs daily.  Wear shoes that fit properly and have enough cushioning.  If you have foot problems, report any cuts, sores, or bruises to your health care provider immediately.  Schedule a complete foot exam at least once a year (annually) or more often if you have foot problems. This information is not intended to replace advice given to you by your health care provider. Make sure you discuss any questions you have with your health care provider. Document Revised: 06/30/2019 Document Reviewed: 11/08/2016 Elsevier Patient Education  2020 [employer]  

## 2020-07-06 NOTE — Progress Notes (Signed)
[name],[name],acting as a Education administrator for [name] Greenland, MD.,have documented all relevant documentation on the behalf of [name] Greenland, MD,as directed by  [name] Greenland, MD while in the presence of [name] Greenland, MD.  This visit occurred during the SARS-CoV-2 public health emergency.  Safety protocols were in place, including screening questions prior to the visit, additional usage of staff PPE, and extensive cleaning of exam room while observing appropriate contact time as indicated for disinfecting solutions.  Subjective:     Patient ID: [name] , female    DOB: [birth year] , 64 y.o.   MRN: [id number]   Chief Complaint  Patient presents with  . Diabetes  . Immunizations    tdap    HPI  The patient is here today for a follow-up on her diabetes.  She reports compliance with meds.  Feels well on current regimen.   Diabetes She presents for her follow-up diabetic visit. She has type 2 diabetes mellitus. Her disease course has been improving. There are no hypoglycemic associated symptoms. Pertinent negatives for diabetes include no chest pain, no fatigue and no weakness. There are no hypoglycemic complications. Risk factors for coronary artery disease include diabetes mellitus, obesity and post-menopausal. She is following a diabetic diet. Meal planning includes avoidance of concentrated sweets. She participates in exercise intermittently. Eye exam is not current.  Hypertension This is a chronic problem. The current episode started more than 1 year ago. The problem has been gradually improving since onset. The problem is controlled. Pertinent negatives include no chest pain. Risk factors for coronary artery disease include diabetes mellitus, sedentary lifestyle, obesity and post-menopausal state. Compliance problems include exercise.      Past Medical History:  Diagnosis Date  . Allergy   . Anemia   . Anemia   . Aortic insufficiency    a. Mod by echo 05/2015.  . Arthritis     "knees" (07/11/2015)  . Atrial fibrillation (HCC)    On Eliquis  . 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 Complication of anesthesia    "[name] had a hard time waking me up w/one of my shoulder surgeries"  . Coronary artery disease    a. Abnormal stress test -> LHc 06/2015 s/p DES to mCX and mLAD, residual D2 disease treated medially.  . Diverticulitis   . Elevated blood pressure   . Erythema nodosum   . Heart murmur   . Insomnia   . lung ca dx'd 10/1999   chemo/xrt comp 05/29/2000  . Myalgia   . Nephrolithiasis 2015  . Obesity   . Pericardial effusion    a. 05/2015 Echo: EF 55-60%, no rwma, Gr 1 DD, no effusion - but an effusion was seen on CT which was felt to be increased in size compared to 12/2014 - pericardium also thickened.   . Pericarditis    a. Dx 05/2015.  [name] Pneumothorax, spontaneous, tension 01/20/2012   October, 2010  . Pulmonary fibrosis ([place]) 01/20/2012   Due to previous surgery and chest radiation for lung cancer  . Restrictive lung disease   . Type II diabetes mellitus (HCC)      Family History  Problem Relation Age of Onset  . Heart disease Father   . Diabetes Father   . Pancreatic cancer Mother   . Diabetes Mother   . Hypertension Sister  x 3  . Diabetes Sister        x 4  . Diabetes Maternal Grandmother   . Diabetes Paternal Grandmother   . Diabetes Sister   . Diabetes Sister   . Diabetes Sister   . Kidney disease Sister   . Colon cancer Neg Hx   . Colon polyps Neg Hx   . Rectal cancer Neg Hx   . Stomach cancer Neg Hx   . Esophageal cancer Neg Hx   . Breast cancer Neg Hx      Current Outpatient Medications:  .  Accu-Chek Softclix Lancets lancets, USE LANCETS 3 TIMES A DAY AS DIRECTED 30 DAYS, Disp: , Rfl:  .  azaTHIOprine (IMURAN) 50 MG tablet, Take by mouth., Disp: , Rfl:  .  B-D UF III MINI PEN NEEDLES 31G X 5 MM MISC, 2 (two)  times daily. as directed, Disp: , Rfl:  .  Blood Glucose Monitoring Suppl (ACCU-CHEK GUIDE) w/Device KIT, See admin instructions., Disp: , Rfl:  .  co-enzyme Q-10 50 MG capsule, TAKE 1 CAPSULE BY MOUTH EVERY DAY, Disp: 30 capsule, Rfl: 7 .  diltiazem (CARDIZEM CD) 360 MG 24 hr capsule, TAKE 1 CAPSULE BY MOUTH EVERY DAY, Disp: 90 capsule, Rfl: 1 .  dofetilide (TIKOSYN) 500 MCG capsule, TAKE 1 CAPSULE (500 MCG TOTAL) BY MOUTH 2 (TWO) TIMES DAILY., Disp: 180 capsule, Rfl: 3 .  ELIQUIS 5 MG TABS tablet, TAKE 1 TABLET BY MOUTH TWICE A DAY, Disp: 180 tablet, Rfl: 1 .  folic acid (FOLVITE) 1 MG tablet, Take 1 mg by mouth daily., Disp: , Rfl:  .  furosemide (LASIX) 40 MG tablet, Take 40 mg by mouth as needed., Disp: , Rfl:  .  HUMALOG MIX 75/25 KWIKPEN (75-25) 100 UNIT/ML Kwikpen, Inject 30-40 Units into the skin See admin instructions. Inject 40 units SQ in Am and 30 units SQ in PM, Disp: , Rfl: 6 .  levothyroxine (SYNTHROID, LEVOTHROID) 50 MCG tablet, Take 50 mcg by mouth daily before breakfast. , Disp: , Rfl: 6 .  losartan (COZAAR) 25 MG tablet, Take 1 tablet (25 mg total) by mouth daily., Disp: 90 tablet, Rfl: 2 .  metoprolol tartrate (LOPRESSOR) 25 MG tablet, TAKE 1 TABLET BY MOUTH TWICE A DAY, Disp: 60 tablet, Rfl: 2 .  nitroGLYCERIN (NITROSTAT) 0.4 MG SL tablet, Place 1 tablet (0.4 mg total) under the tongue every 5 (five) minutes as needed for chest pain., Disp: 25 tablet, Rfl: 3 .  ONE TOUCH ULTRA TEST test strip, USE AS DIRECTED 3 TIMES A DAY, Disp: , Rfl:  .  pravastatin (PRAVACHOL) 20 MG tablet, Take 1 tablet (20 mg total) by mouth daily., Disp: 90 tablet, Rfl: 2 .  spironolactone (ALDACTONE) 25 MG tablet, TAKE 1 TABLET BY MOUTH EVERY DAY, Disp: 90 tablet, Rfl: 3 .  OZEMPIC, 0.25 OR 0.5 MG/DOSE, 2 MG/1.5ML SOPN, INJECT 0.5 MG INTO THE SKIN ONCE A WEEK., Disp: 3 pen, Rfl: 3   Allergies  Allergen Reactions  . Hydrocodone Hives and Itching  . Hydrocodone-Acetaminophen Hives  . Morphine And  Related Other (See Comments)    GI PROBLEMS  . Rosuvastatin Other (See Comments)    Pt reports causes lower extremity muscle aches/cramping  . Lisinopril Cough  . Nsaids Other (See Comments)    GI PROBLEMS     Review of Systems  Constitutional: Negative.  Negative for fatigue.  Respiratory: Negative.   Cardiovascular: Negative.  Negative for chest pain.  Gastrointestinal: Negative.   Neurological: Negative  for weakness.  Psychiatric/Behavioral: Negative.   All other systems reviewed and are negative.    Today's Vitals   07/06/20 1416  BP: 136/80  Pulse: 76  Temp: 98.1 F (36.7 C)  TempSrc: Oral  Weight: 238 lb 6.4 oz (108.1 kg)  Height: '5\' 7"'  (1.702 m)  PainSc: 0-No pain   Body mass index is 37.34 kg/m.  Wt Readings from Last 3 Encounters:  07/14/20 240 lb (108.9 kg)  07/06/20 238 lb (108 kg)  07/06/20 238 lb 6.4 oz (108.1 kg)   Objective:  Physical Exam Vitals and nursing note reviewed.  Constitutional:      Appearance: Normal appearance. She is obese.  HENT:     Head: Normocephalic and atraumatic.  Cardiovascular:     Rate and Rhythm: Normal rate and regular rhythm.     Heart sounds: Normal heart sounds.  Pulmonary:     Effort: Pulmonary effort is normal.     Breath sounds: Normal breath sounds.  Skin:    General: Skin is warm and dry.  Neurological:     General: No focal deficit present.     Mental Status: She is alert and oriented to person, place, and time.  Psychiatric:        Mood and Affect: Mood normal.         Assessment And Plan:     1. Type 2 diabetes mellitus with stage 3 chronic kidney disease, with long-term current use of insulin, unspecified whether stage 3a or 3b CKD ([place]) Comments: Chronic, [name] will check labs as listed below. AWV also performed by [facility] advisor today.  - Hemoglobin A1c - CMP14+EGFR - CBC no Diff  2. Hypertensive heart and renal disease with renal failure, stage 1 through stage 4 or unspecified chronic kidney  disease, with heart failure (HCC) Comments: Chronic, fair control. Advised that optimal BP is LESS THAN 130/80. Encouraged to follow a low sodium diet.   3. PVD (peripheral vascular disease) ([place]) Comments: Chronic, encouraged to comply with statin use, exercise regularly and comply with Eliquis use.  4. Chronic diastolic (congestive) heart failure (HCC) Comments: Chronic, yet stable. Appears euvolemic today.   5. Class 2 severe obesity due to excess calories with serious comorbidity and body mass index (BMI) of 37.0 to 37.9 in adult [facility]) Comments: She is encouraged to strive for BMI less than 30 to decrease cardiac risk. Advised to aim for at least 150 minutes of exercise weekly.   6. Need for vaccination Comments: Rx Boostrix was sent to her local pharmacy.     Patient was given opportunity to ask questions. Patient verbalized understanding of the plan and was able to repeat key elements of the plan. All questions were answered to their satisfaction.  [name] Greenland, MD   [name], [name] Greenland, MD, have reviewed all documentation for this visit. The documentation on 07/17/20 for the exam, diagnosis, procedures, and orders are all accurate and complete.  THE PATIENT IS ENCOURAGED TO PRACTICE SOCIAL DISTANCING DUE TO THE COVID-19 PANDEMIC.

## 2020-07-06 NOTE — Patient Instructions (Signed)
[name] , Thank you for taking time to come for your Medicare Wellness Visit. I appreciate your ongoing commitment to your health goals. Please review the following plan we discussed and let me know if I can assist you in the future.   Screening recommendations/referrals: Colonoscopy: completed 05/28/2018 Mammogram: completed 01/03/2020 Bone Density: n/a Recommended yearly ophthalmology/optometry visit for glaucoma screening and checkup Recommended yearly dental visit for hygiene and checkup  Vaccinations: Influenza vaccine: declined Pneumococcal vaccine: completed 06/16/2019 Tdap vaccine: ordered by PCP Shingles vaccine: completed  Covid-19:  01/19/2020, 12/24/2019  Advanced directives: Advance directive discussed with you today. Even though you declined this today please call our office should you change your mind and we can give you the proper paperwork for you to fill out.  Conditions/risks identified: none  Next appointment:09/04/2020 at 2:00  Follow up in one year for your annual wellness visit.   Preventive Care 40-64 Years, Female Preventive care refers to lifestyle choices and visits with your health care provider that can promote health and wellness. What does preventive care include?  A yearly physical exam. This is also called an annual well check.  Dental exams once or twice a year.  Routine eye exams. Ask your health care provider how often you should have your eyes checked.  Personal lifestyle choices, including:  Daily care of your teeth and gums.  Regular physical activity.  Eating a healthy diet.  Avoiding tobacco and drug use.  Limiting alcohol use.  Practicing safe sex.  Taking low-dose aspirin daily starting at age 54.  Taking vitamin and mineral supplements as recommended by your health care provider. What happens during an annual well check? The services and screenings done by your health care provider during your annual well check will depend on  your age, overall health, lifestyle risk factors, and family history of disease. Counseling  Your health care provider may ask you questions about your:  Alcohol use.  Tobacco use.  Drug use.  Emotional well-being.  Home and relationship well-being.  Sexual activity.  Eating habits.  Work and work Statistician.  Method of birth control.  Menstrual cycle.  Pregnancy history. Screening  You may have the following tests or measurements:  Height, weight, and BMI.  Blood pressure.  Lipid and cholesterol levels. These may be checked every 5 years, or more frequently if you are over 77 years old.  Skin check.  Lung cancer screening. You may have this screening every year starting at age 30 if you have a 30-pack-year history of smoking and currently smoke or have quit within the past 15 years.  Fecal occult blood test (FOBT) of the stool. You may have this test every year starting at age 73.  Flexible sigmoidoscopy or colonoscopy. You may have a sigmoidoscopy every 5 years or a colonoscopy every 10 years starting at age 47.  Hepatitis C blood test.  Hepatitis B blood test.  Sexually transmitted disease (STD) testing.  Diabetes screening. This is done by checking your blood sugar (glucose) after you have not eaten for a while (fasting). You may have this done every 1-3 years.  Mammogram. This may be done every 1-2 years. Talk to your health care provider about when you should start having regular mammograms. This may depend on whether you have a family history of breast cancer.  BRCA-related cancer screening. This may be done if you have a family history of breast, ovarian, tubal, or peritoneal cancers.  Pelvic exam and Pap test. This may be done  every 3 years starting at age 30. Starting at age 42, this may be done every 5 years if you have a Pap test in combination with an HPV test.  Bone density scan. This is done to screen for osteoporosis. You may have this scan if  you are at high risk for osteoporosis. Discuss your test results, treatment options, and if necessary, the need for more tests with your health care provider. Vaccines  Your health care provider may recommend certain vaccines, such as:  Influenza vaccine. This is recommended every year.  Tetanus, diphtheria, and acellular pertussis (Tdap, Td) vaccine. You may need a Td booster every 10 years.  Zoster vaccine. You may need this after age 43.  Pneumococcal 13-valent conjugate (PCV13) vaccine. You may need this if you have certain conditions and were not previously vaccinated.  Pneumococcal polysaccharide (PPSV23) vaccine. You may need one or two doses if you smoke cigarettes or if you have certain conditions. Talk to your health care provider about which screenings and vaccines you need and how often you need them. This information is not intended to replace advice given to you by your health care provider. Make sure you discuss any questions you have with your health care provider. Document Released: 11/03/2015 Document Revised: 06/26/2016 Document Reviewed: 08/08/2015 Elsevier Interactive Patient Education  2017 [place] Prevention in the Home Falls can cause injuries. They can happen to people of all ages. There are many things you can do to make your home safe and to help prevent falls. What can I do on the outside of my home?  Regularly fix the edges of walkways and driveways and fix any cracks.  Remove anything that might make you trip as you walk through a door, such as a raised step or threshold.  Trim any bushes or trees on the path to your home.  Use bright outdoor lighting.  Clear any walking paths of anything that might make someone trip, such as rocks or tools.  Regularly check to see if handrails are loose or broken. Make sure that both sides of any steps have handrails.  Any raised decks and porches should have guardrails on the edges.  Have any  leaves, snow, or ice cleared regularly.  Use sand or salt on walking paths during winter.  Clean up any spills in your garage right away. This includes oil or grease spills. What can I do in the bathroom?  Use night lights.  Install grab bars by the toilet and in the tub and shower. Do not use towel bars as grab bars.  Use non-skid mats or decals in the tub or shower.  If you need to sit down in the shower, use a plastic, non-slip stool.  Keep the floor dry. Clean up any water that spills on the floor as soon as it happens.  Remove soap buildup in the tub or shower regularly.  Attach bath mats securely with double-sided non-slip rug tape.  Do not have throw rugs and other things on the floor that can make you trip. What can I do in the bedroom?  Use night lights.  Make sure that you have a light by your bed that is easy to reach.  Do not use any sheets or blankets that are too big for your bed. They should not hang down onto the floor.  Have a firm chair that has side arms. You can use this for support while you get dressed.  Do not have  throw rugs and other things on the floor that can make you trip. What can I do in the kitchen?  Clean up any spills right away.  Avoid walking on wet floors.  Keep items that you use a lot in easy-to-reach places.  If you need to reach something above you, use a strong step stool that has a grab bar.  Keep electrical cords out of the way.  Do not use floor polish or wax that makes floors slippery. If you must use wax, use non-skid floor wax.  Do not have throw rugs and other things on the floor that can make you trip. What can I do with my stairs?  Do not leave any items on the stairs.  Make sure that there are handrails on both sides of the stairs and use them. Fix handrails that are broken or loose. Make sure that handrails are as long as the stairways.  Check any carpeting to make sure that it is firmly attached to the stairs.  Fix any carpet that is loose or worn.  Avoid having throw rugs at the top or bottom of the stairs. If you do have throw rugs, attach them to the floor with carpet tape.  Make sure that you have a light switch at the top of the stairs and the bottom of the stairs. If you do not have them, ask someone to add them for you. What else can I do to help prevent falls?  Wear shoes that:  Do not have high heels.  Have rubber bottoms.  Are comfortable and fit you well.  Are closed at the toe. Do not wear sandals.  If you use a stepladder:  Make sure that it is fully opened. Do not climb a closed stepladder.  Make sure that both sides of the stepladder are locked into place.  Ask someone to hold it for you, if possible.  Clearly mark and make sure that you can see:  Any grab bars or handrails.  First and last steps.  Where the edge of each step is.  Use tools that help you move around (mobility aids) if they are needed. These include:  Canes.  Walkers.  Scooters.  Crutches.  Turn on the lights when you go into a dark area. Replace any light bulbs as soon as they burn out.  Set up your furniture so you have a clear path. Avoid moving your furniture around.  If any of your floors are uneven, fix them.  If there are any pets around you, be aware of where they are.  Review your medicines with your doctor. Some medicines can make you feel dizzy. This can increase your chance of falling. Ask your doctor what other things that you can do to help prevent falls. This information is not intended to replace advice given to you by your health care provider. Make sure you discuss any questions you have with your health care provider. Document Released: 08/03/2009 Document Revised: 03/14/2016 Document Reviewed: 11/11/2014 Elsevier Interactive Patient Education  2017 [employer].

## 2020-07-06 NOTE — Progress Notes (Signed)
This visit occurred during the SARS-CoV-2 public health emergency.  Safety protocols were in place, including screening questions prior to the visit, additional usage of staff PPE, and extensive cleaning of exam room while observing appropriate contact time as indicated for disinfecting solutions.  Subjective:   [name] is a 64 y.o. female who presents for Medicare Annual (Subsequent) preventive examination.  Review of Systems     Cardiac Risk Factors include: diabetes mellitus;dyslipidemia;hypertension;obesity (BMI >30kg/m2);sedentary lifestyle     Objective:    Today's Vitals   07/06/20 1450  BP: 136/80  Pulse: 76  Temp: 98.1 F (36.7 C)  TempSrc: Oral  Weight: 238 lb (108 kg)  Height: '5\' 7"'  (1.702 m)   Body mass index is 37.28 kg/m.  Advanced Directives 07/06/2020 06/30/2019 02/17/2019 02/16/2019 09/01/2018 08/26/2018 05/28/2018  Does Patient Have a Medical Advance Directive? No No - No No No No  Would patient like information on creating a medical advance directive? No - Patient declined - No - Patient declined - - No - Patient declined No - Patient declined    Current Medications (verified) Outpatient Encounter Medications as of 07/06/2020  Medication Sig  . Accu-Chek Softclix Lancets lancets USE LANCETS 3 TIMES A DAY AS DIRECTED 30 DAYS  . azaTHIOprine (IMURAN) 50 MG tablet Take by mouth.  . B-D UF III MINI PEN NEEDLES 31G X 5 MM MISC 2 (two) times daily. as directed  . Blood Glucose Monitoring Suppl (ACCU-CHEK GUIDE) w/Device KIT See admin instructions.  [name] co-enzyme Q-10 50 MG capsule TAKE 1 CAPSULE BY MOUTH EVERY DAY  . diltiazem (CARDIZEM CD) 360 MG 24 hr capsule TAKE 1 CAPSULE BY MOUTH EVERY DAY  . dofetilide (TIKOSYN) 500 MCG capsule TAKE 1 CAPSULE (500 MCG TOTAL) BY MOUTH 2 (TWO) TIMES DAILY.  [name] ELIQUIS 5 MG TABS tablet TAKE 1 TABLET BY MOUTH TWICE A DAY  . folic acid (FOLVITE) 1 MG tablet Take 1 mg by mouth daily.  . furosemide (LASIX) 40 MG tablet Take 40 mg by  mouth as needed.  [name] HUMALOG MIX 75/25 KWIKPEN (75-25) 100 UNIT/ML Kwikpen Inject 30-40 Units into the skin See admin instructions. Inject 40 units SQ in Am and 30 units SQ in PM  . levothyroxine (SYNTHROID, LEVOTHROID) 50 MCG tablet Take 50 mcg by mouth daily before breakfast.   . losartan (COZAAR) 25 MG tablet Take 1 tablet (25 mg total) by mouth daily.  . methotrexate (RHEUMATREX) 2.5 MG tablet Taking by mouth  7m in the am and 5 mg at night  . metoprolol tartrate (LOPRESSOR) 25 MG tablet TAKE 1 TABLET BY MOUTH TWICE A DAY  . nitroGLYCERIN (NITROSTAT) 0.4 MG SL tablet Place 1 tablet (0.4 mg total) under the tongue every 5 (five) minutes as needed for chest pain.  . ONE TOUCH ULTRA TEST test strip USE AS DIRECTED 3 TIMES A DAY  . OZEMPIC, 0.25 OR 0.5 MG/DOSE, 2 MG/1.5ML SOPN INJECT 0.5 MG INTO THE SKIN ONCE A WEEK. (Patient not taking: Reported on 07/06/2020)  . pravastatin (PRAVACHOL) 20 MG tablet Take 1 tablet (20 mg total) by mouth daily.  .Marland Kitchenspironolactone (ALDACTONE) 25 MG tablet TAKE 1 TABLET BY MOUTH EVERY DAY   No facility-administered encounter medications on file as of 07/06/2020.    Allergies (verified) Hydrocodone, Hydrocodone-acetaminophen, Morphine and related, Rosuvastatin, Lisinopril, and Nsaids   History: Past Medical History:  Diagnosis Date  . Allergy   . Anemia   . Anemia   . Aortic insufficiency  a. Mod by echo 05/2015.  . Arthritis    "knees" (07/11/2015)  . Atrial fibrillation (HCC)    On Eliquis  . 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 Complication of anesthesia    "I had a hard time waking me up w/one of my shoulder surgeries"  . Coronary artery disease    a. Abnormal stress test -> LHc 06/2015 s/p DES to mCX and mLAD, residual D2 disease treated medially.  . Diverticulitis   . Elevated blood pressure   . Erythema nodosum   . Heart  murmur   . Insomnia   . lung ca dx'd 10/1999   chemo/xrt comp 05/29/2000  . Myalgia   . Nephrolithiasis 2015  . Obesity   . Pericardial effusion    a. 05/2015 Echo: EF 55-60%, no rwma, Gr 1 DD, no effusion - but an effusion was seen on CT which was felt to be increased in size compared to 12/2014 - pericardium also thickened.   . Pericarditis    a. Dx 05/2015.  [name] Pneumothorax, spontaneous, tension 01/20/2012   October, 2010  . Pulmonary fibrosis ([place]) 01/20/2012   Due to previous surgery and chest radiation for lung cancer  . Restrictive lung disease   . Type II diabetes mellitus ([place])    Past Surgical History:  Procedure Laterality Date  . ABDOMINAL HYSTERECTOMY  2008  . APPENDECTOMY  1969?  [name] CARDIAC CATHETERIZATION N/A 07/11/2015   Procedure: Left Heart Cath and Coronary Angiography;  Surgeon: Jettie Booze, MD;  Location: [facility];  Service: Cardiovascular;  Laterality: N/A;  . CARDIAC CATHETERIZATION  07/11/2015   Procedure: Coronary Stent Intervention;  Surgeon: Jettie Booze, MD;  Location: [facility];  Service: Cardiovascular;;  . CARPAL TUNNEL RELEASE Bilateral 1998-2005?   right-left  . CARPAL TUNNEL RELEASE Left 2009?   "for the 2nd time"  . COLONOSCOPY    . COLONOSCOPY WITH PROPOFOL N/A 05/28/2018   Procedure: COLONOSCOPY WITH PROPOFOL;  Surgeon: Milus Banister, MD;  Location: [facility];  Service: Endoscopy;  Laterality: N/A;  . CORONARY ANGIOPLASTY    . CYSTOSCOPY W/ STONE MANIPULATION  2013  . ESOPHAGOGASTRODUODENOSCOPY (EGD) WITH ESOPHAGEAL DILATION  2012  . FOOT SURGERY Bilateral 1980's   Callous removed   . INNER EAR SURGERY Right ~ 2009  . KNEE ARTHROPLASTY Right 1991  . KNEE ARTHROSCOPY  ~ 2000   LATERAL RELEASE  . LEFT HEART CATH AND CORONARY ANGIOGRAPHY N/A 12/18/2016   Procedure: Left Heart Cath and Coronary Angiography;  Surgeon: Burnell Blanks, MD;  Location: [facility];  Service: Cardiovascular;  Laterality: N/A;    . LUNG CANCER SURGERY Left 2001   "small cell"  . LUNG SURGERY Right 2007   "reinflated it"  . PULMONARY EMBOLISM SURGERY  2007   LUNG COLLAPSE  . RIGHT AND LEFT HEART CATH N/A 06/25/2017   Procedure: RIGHT AND LEFT HEART CATH;  Surgeon: Sherren Mocha, MD;  Location: [facility];  Service: Cardiovascular;  Laterality: N/A;  . SHOULDER ADHESION RELEASE Right ~ 2004  . SHOULDER ARTHROSCOPY W/ ROTATOR CUFF REPAIR Right ~ 2003   Family History  Problem Relation Age of Onset  . Heart disease Father   . Diabetes Father   . Pancreatic cancer Mother   . Diabetes Mother   . Hypertension Sister  x 3  . Diabetes Sister        x 4  . Diabetes Maternal Grandmother   . Diabetes Paternal Grandmother   . Diabetes Sister   . Diabetes Sister   . Diabetes Sister   . Kidney disease Sister   . Colon cancer Neg Hx   . Colon polyps Neg Hx   . Rectal cancer Neg Hx   . Stomach cancer Neg Hx   . Esophageal cancer Neg Hx   . Breast cancer Neg Hx    Social History   Socioeconomic History  . Marital status: Single    Spouse name: Not on file  . Number of children: 0  . Years of education: Not on file  . Highest education level: Not on file  Occupational History  . Occupation: RETIRED  Tobacco Use  . Smoking status: Former Smoker    Packs/day: 0.25    Years: 5.00    Pack years: 1.25    Types: Cigarettes    Quit date: 07/22/1999    Years since quitting: 20.9  . Smokeless tobacco: Never Used  Vaping Use  . Vaping Use: Never used  Substance and Sexual Activity  . Alcohol use: No    Alcohol/week: 0.0 standard drinks  . Drug use: No  . Sexual activity: Not Currently    Birth control/protection: Other-see comments    Comment: hysterectomy  Other Topics Concern  . Not on file  Social History Narrative  . Not on file   Social Determinants of Health   Financial Resource Strain: Low Risk   . Difficulty of Paying Living Expenses: Not hard at all  Food Insecurity: No Food  Insecurity  . Worried About Charity fundraiser in the Last Year: Never true  . Ran Out of Food in the Last Year: Never true  Transportation Needs: No Transportation Needs  . Lack of Transportation (Medical): No  . Lack of Transportation (Non-Medical): No  Physical Activity: Inactive  . Days of Exercise per Week: 0 days  . Minutes of Exercise per Session: 0 min  Stress: No Stress Concern Present  . Feeling of Stress : Not at all  Social Connections:   . Frequency of Communication with Friends and Family: Not on file  . Frequency of Social Gatherings with Friends and Family: Not on file  . Attends Religious Services: Not on file  . Active Member of Clubs or Organizations: Not on file  . Attends Archivist Meetings: Not on file  . Marital Status: Not on file    Tobacco Counseling Counseling given: Not Answered   Clinical Intake:  Pre-visit preparation completed: Yes  Pain : No/denies pain     Nutritional Status: BMI > 30  Obese Nutritional Risks: None Diabetes: Yes  How often do you need to have someone help you when you read instructions, pamphlets, or other written materials from your doctor or pharmacy?: 1 - Never What is the last grade level you completed in school?: 12th grade  Diabetic? Yes Nutrition Risk Assessment:  Has the patient had any N/V/D within the last 2 months?  No  Does the patient have any non-healing wounds?  No  Has the patient had any unintentional weight loss or weight gain?  No   Diabetes:  Is the patient diabetic?  Yes  If diabetic, was a CBG obtained today?  No  Did the patient bring in their glucometer from home?  No  How often do you monitor your CBG's? Twice  daily.   Financial Strains and Diabetes Management:  Are you having any financial strains with the device, your supplies or your medication? No .  Does the patient want to be seen by Chronic Care Management for management of their diabetes?  No  Would the patient like  to be referred to a Nutritionist or for Diabetic Management?  No   Diabetic Exams:  Diabetic Eye Exam: Overdue for diabetic eye exam. Pt has been advised about the importance in completing this exam. Patient advised to call and schedule an eye exam. Diabetic Foot Exam: Completed 09/01/2019   Interpreter Needed?: No  Information entered by :: NAllen LPN   Activities of Daily Living In your present state of health, do you have any difficulty performing the following activities: 07/06/2020 07/06/2020  Hearing? Y Y  Comment right ear -  Vision? N N  Difficulty concentrating or making decisions? N N  Walking or climbing stairs? Y Y  Dressing or bathing? N Y  Doing errands, shopping? N N  Preparing Food and eating ? N -  Using the Toilet? N -  In the past six months, have you accidently leaked urine? Y -  Do you have problems with loss of bowel control? N -  Managing your Medications? N -  Managing your Finances? N -  Housekeeping or managing your Housekeeping? N -  Some recent data might be hidden    Patient Care Team: Glendale Chard, MD as PCP - General (Internal Medicine) Dorothy Spark, MD as PCP - Cardiology (Cardiology) Constance Haw, MD as PCP - Electrophysiology (Cardiology) Fay Records, MD (Cardiology) Melida Quitter, MD Milus Banister, MD (Gastroenterology) Dorna Leitz, MD (Orthopedic Surgery) Annia Belt, MD (Hematology and Oncology) Nicanor Alcon, MD (Thoracic Surgery) Truddie Crumble, MD (Radiation Oncology) Clent Jacks, MD as Consulting Physician (Ophthalmology) Jacelyn Pi, MD as Consulting Physician (Endocrinology)  Indicate any recent Medical Services you may have received from other than [facility] providers in the past year (date may be approximate).     Assessment:   This is a routine wellness examination for [place].  Hearing/Vision screen  Hearing Screening   '125Hz'  '250Hz'  '500Hz'  '1000Hz'  '2000Hz'  '3000Hz'  '4000Hz'  '6000Hz'  '8000Hz'     Right ear:           Left ear:           Vision Screening Comments: Regular eye exams, Dr. Katy Fitch  Dietary issues and exercise activities discussed: Current Exercise Habits: The patient does not participate in regular exercise at present  Goals    .  DIET - INCREASE WATER INTAKE (pt-stated)    .  Patient Stated      07/06/2020, wants to get to 200 pounds    .  Weight (lb) < 200 lb (90.7 kg) (pt-stated)      Wants to lose 20 pounds    .  Weight (lb) < 200 lb (90.7 kg)      06/30/2019, wants get to 160 pounds      Depression Screen PHQ 2/9 Scores 07/06/2020 01/25/2020 09/01/2019 06/30/2019 09/23/2018 08/26/2018 11/23/2015  PHQ - 2 Score 0 0 0 0 0 0 0  PHQ- 9 Score - - - 3 - - -    Fall Risk Fall Risk  07/06/2020 01/25/2020 09/01/2019 06/30/2019 08/26/2018  Falls in the past year? 0 0 0 0 0  Number falls in past yr: - - - - -  Risk for fall due to : Medication side effect - -  Medication side effect Medication side effect  Follow up Falls evaluation completed;Education provided;Falls prevention discussed - - Falls evaluation completed;Education provided;Falls prevention discussed -    Any stairs in or around the home? Yes  If so, are there any without handrails? No  Home free of loose throw rugs in walkways, pet beds, electrical cords, etc? Yes  Adequate lighting in your home to reduce risk of falls? Yes   ASSISTIVE DEVICES UTILIZED TO PREVENT FALLS:  Life alert? No  Use of a cane, walker or w/c? No  Grab bars in the bathroom? Yes  Shower chair or bench in shower? Yes  Elevated toilet seat or a handicapped toilet? Yes   TIMED UP AND GO:  Was the test performed? No .    Gait steady and fast without use of assistive device  Cognitive Function:     6CIT Screen 07/06/2020 06/30/2019 08/26/2018  What Year? 0 points 0 points 0 points  What month? 0 points 0 points 0 points  What time? 0 points 0 points 0 points  Count back from 20 0 points 0 points 0 points  Months in reverse 2 points 0  points 0 points  Repeat phrase 4 points 2 points 0 points  Total Score 6 2 0    Immunizations Immunization History  Administered Date(s) Administered  . Influenza Whole 07/21/2009, 07/29/2010  . Influenza,inj,Quad PF,6+ Mos 11/23/2015, 06/16/2019  . Influenza-Unspecified 08/14/2016, 06/16/2019  . PFIZER SARS-COV-2 Vaccination 12/24/2019, 01/19/2020  . PPD Test 06/19/2013  . Pneumococcal Conjugate-13 04/18/2015  . Pneumococcal Polysaccharide-23 07/29/2010, 06/16/2019, 06/16/2019  . Zoster Recombinat (Shingrix) 12/09/2019, 02/14/2020    TDAP status: Due, Education has been provided regarding the importance of this vaccine. Advised may receive this vaccine at local pharmacy or Health Dept. Aware to provide a copy of the vaccination record if obtained from local pharmacy or Health Dept. Verbalized acceptance and understanding. Flu Vaccine status: Declined, Education has been provided regarding the importance of this vaccine but patient still declined. Advised may receive this vaccine at local pharmacy or Health Dept. Aware to provide a copy of the vaccination record if obtained from local pharmacy or Health Dept. Verbalized acceptance and understanding. Pneumococcal vaccine status: Up to date Covid-19 vaccine status: Completed vaccines  Qualifies for Shingles Vaccine? Yes   Zostavax completed No   Shingrix Completed?: Yes  Screening Tests Health Maintenance  Topic Date Due  . TETANUS/TDAP  Never done  . HEMOGLOBIN A1C  02/29/2020  . OPHTHALMOLOGY EXAM  06/29/2020  . INFLUENZA VACCINE  01/18/2021 (Originally 05/21/2020)  . FOOT EXAM  08/31/2020  . MAMMOGRAM  01/02/2022  . COLONOSCOPY  05/28/2028  . PNEUMOCOCCAL POLYSACCHARIDE VACCINE AGE 15-64 HIGH RISK  Completed  . COVID-19 Vaccine  Completed  . Hepatitis C Screening  Completed  . HIV Screening  Completed  . PAP SMEAR-Modifier  Discontinued    Health Maintenance  Health Maintenance Due  Topic Date Due  . TETANUS/TDAP  Never  done  . HEMOGLOBIN A1C  02/29/2020  . OPHTHALMOLOGY EXAM  06/29/2020    Colorectal cancer screening: Completed 05/28/2018. Repeat every 10 years Mammogram status: Completed 01/03/2020. Repeat every year Bone Density status: n/a  Lung Cancer Screening: (Low Dose CT Chest recommended if Age 57-80 years, 30 pack-year currently smoking OR have quit w/in 15years.) does not qualify.   Lung Cancer Screening Referral: no  Additional Screening:  Hepatitis C Screening: does qualify; Completed 07/11/2015  Vision Screening: Recommended annual ophthalmology exams for early detection of glaucoma and other  disorders of the eye. Is the patient up to date with their annual eye exam?  No  Who is the provider or what is the name of the office in which the patient attends annual eye exams? Dr. Katy Fitch If pt is not established with a provider, would they like to be referred to a provider to establish care? No .   Dental Screening: Recommended annual dental exams for proper oral hygiene  Community Resource Referral / Chronic Care Management: CRR required this visit?  No   CCM required this visit?  No      Plan:     I have personally reviewed and noted the following in the patient's chart:   . Medical and social history . Use of alcohol, tobacco or illicit drugs  . Current medications and supplements . Functional ability and status . Nutritional status . Physical activity . Advanced directives . List of other physicians . Hospitalizations, surgeries, and ER visits in previous 12 months . Vitals . Screenings to include cognitive, depression, and falls . Referrals and appointments  In addition, I have reviewed and discussed with patient certain preventive protocols, quality metrics, and best practice recommendations. A written personalized care plan for preventive services as well as general preventive health recommendations were provided to patient.     Kellie Simmering, LPN   2/75/1700    Nurse Notes:

## 2020-07-07 LAB — CMP14+EGFR
ALT: 11 IU/L (ref 0–32)
AST: 12 IU/L (ref 0–40)
Albumin/Globulin Ratio: 1.4 (ref 1.2–2.2)
Albumin: 4.3 g/dL (ref 3.8–4.8)
Alkaline Phosphatase: 113 IU/L (ref 44–121)
BUN/Creatinine Ratio: 24 (ref 12–28)
BUN: 24 mg/dL (ref 8–27)
Bilirubin Total: 0.2 mg/dL (ref 0.0–1.2)
CO2: 25 mmol/L (ref 20–29)
Calcium: 9.9 mg/dL (ref 8.7–10.3)
Chloride: 105 mmol/L (ref 96–106)
Creatinine, Ser: 0.98 mg/dL (ref 0.57–1.00)
GFR calc Af Amer: 72 mL/min/{1.73_m2} (ref >59)
GFR calc non Af Amer: 62 mL/min/{1.73_m2} (ref >59)
Globulin, Total: 3 g/dL (ref 1.5–4.5)
Glucose: 96 mg/dL (ref 65–99)
Potassium: 5.1 mmol/L (ref 3.5–5.2)
Sodium: 143 mmol/L (ref 134–144)
Total Protein: 7.3 g/dL (ref 6.0–8.5)

## 2020-07-07 LAB — CBC
Hematocrit: 36 % (ref 34.0–46.6)
Hemoglobin: 11.9 g/dL (ref 11.1–15.9)
MCH: 31.6 pg (ref 26.6–33.0)
MCHC: 33.1 g/dL (ref 31.5–35.7)
MCV: 96 fL (ref 79–97)
Platelets: 355 10*3/uL (ref 150–450)
RBC: 3.77 x10E6/uL (ref 3.77–5.28)
RDW: 11.9 % (ref 11.7–15.4)
WBC: 9.3 10*3/uL (ref 3.4–10.8)

## 2020-07-07 LAB — HEMOGLOBIN A1C
Est. average glucose Bld gHb Est-mCnc: 160 mg/dL
Hgb A1c MFr Bld: 7.2 % — ABNORMAL HIGH (ref 4.8–5.6)

## 2020-07-14 ENCOUNTER — Encounter: Payer: Self-pay | Admitting: Cardiology

## 2020-07-14 ENCOUNTER — Ambulatory Visit: Payer: Medicare PPO | Admitting: Cardiology

## 2020-07-14 ENCOUNTER — Other Ambulatory Visit: Payer: Self-pay

## 2020-07-14 VITALS — BP 124/62 | HR 74 | Ht 67.0 in | Wt 240.0 lb

## 2020-07-14 DIAGNOSIS — I480 Paroxysmal atrial fibrillation: Secondary | ICD-10-CM

## 2020-07-14 NOTE — Progress Notes (Signed)
Electrophysiology Office Note   Date:  07/14/2020   ID:  Breck, Hollinger 06/21/1956, MRN [id number]  PCP:  Glendale Chard, MD  Cardiologist:  [name] Primary Electrophysiologist:  Merit Maybee Meredith Leeds, MD    Chief Complaint: AF   History of Present Illness: [name] is a 64 y.o. female who is being seen today for the evaluation of AF at the request of Glendale Chard, MD. Presenting today for electrophysiology evaluation.  She has a history of coronary artery disease status post circumflex and LAD stents, chronic pericardial effusion with chronic pericarditis and a friction rub, CHF, diabetes, hypertension, non-small cell lung cancer status post surgery and chemotherapy, COPD, pulmonary fibrosis, and atrial fibrillation.  She was admitted to the hospital April 2020 and has since been loaded on dofetilide.  Today, denies symptoms of palpitations, chest pain, shortness of breath, orthopnea, PND, lower extremity edema, claudication, dizziness, presyncope, syncope, bleeding, or neurologic sequela. The patient is tolerating medications without difficulties.  Since last being seen she has done well.  She she continues to have episodic palpitations but all of which are short-lived.  She does not feel that it are any exacerbating or alleviating factors.  She is able to do all of her daily activities without restriction.   Past Medical History:  Diagnosis Date  . Allergy   . Anemia   . Anemia   . Aortic insufficiency    a. Mod by echo 05/2015.  . Arthritis    "knees" (07/11/2015)  . Atrial fibrillation (HCC)    On Eliquis  . Carcinoma of hilus of lung ([place]) 01/20/2012   IIIB NSCL  Right hilar mass compressing esophagus/presenting with dysphagia Rx Surgery/RT/chemo dx January 2001  . Complication of anesthesia    "I had a hard time waking me up w/one of my shoulder surgeries"  . Coronary artery disease    a. Abnormal stress test -> LHc 06/2015 s/p DES to mCX and mLAD, residual D2 disease  treated medially.  . Diverticulitis   . Elevated blood pressure   . Erythema nodosum   . Heart murmur   . Insomnia   . lung ca dx'd 10/1999   chemo/xrt comp 05/29/2000  . Myalgia   . Nephrolithiasis 2015  . Obesity   . Pericardial effusion    a. 05/2015 Echo: EF 55-60%, no rwma, Gr 1 DD, no effusion - but an effusion was seen on CT which was felt to be increased in size compared to 12/2014 - pericardium also thickened.   . Pericarditis    a. Dx 05/2015.  [name] Pneumothorax, spontaneous, tension 01/20/2012   October, 2010  . Pulmonary fibrosis ([place]) 01/20/2012   Due to previous surgery and chest radiation for lung cancer  . Restrictive lung disease   . Type II diabetes mellitus ([place])    Past Surgical History:  Procedure Laterality Date  . ABDOMINAL HYSTERECTOMY  2008  . APPENDECTOMY  1969?  [name] CARDIAC CATHETERIZATION N/A 07/11/2015   Procedure: Left Heart Cath and Coronary Angiography;  Surgeon: Jettie Booze, MD;  Location: [facility];  Service: Cardiovascular;  Laterality: N/A;  . CARDIAC CATHETERIZATION  07/11/2015   Procedure: Coronary Stent Intervention;  Surgeon: Jettie Booze, MD;  Location: [facility];  Service: Cardiovascular;;  . CARPAL TUNNEL RELEASE Bilateral 1998-2005?   right-left  . CARPAL TUNNEL RELEASE Left 2009?   "for the 2nd time"  . COLONOSCOPY    . COLONOSCOPY WITH PROPOFOL N/A 05/28/2018  Procedure: COLONOSCOPY WITH PROPOFOL;  Surgeon: Milus Banister, MD;  Location: [facility];  Service: Endoscopy;  Laterality: N/A;  . CORONARY ANGIOPLASTY    . CYSTOSCOPY W/ STONE MANIPULATION  2013  . ESOPHAGOGASTRODUODENOSCOPY (EGD) WITH ESOPHAGEAL DILATION  2012  . FOOT SURGERY Bilateral 1980's   Callous removed   . INNER EAR SURGERY Right ~ 2009  . KNEE ARTHROPLASTY Right 1991  . KNEE ARTHROSCOPY  ~ 2000   LATERAL RELEASE  . LEFT HEART CATH AND CORONARY ANGIOGRAPHY N/A 12/18/2016   Procedure: Left Heart Cath and Coronary Angiography;  Surgeon:  Burnell Blanks, MD;  Location: [facility];  Service: Cardiovascular;  Laterality: N/A;  . LUNG CANCER SURGERY Left 2001   "small cell"  . LUNG SURGERY Right 2007   "reinflated it"  . PULMONARY EMBOLISM SURGERY  2007   LUNG COLLAPSE  . RIGHT AND LEFT HEART CATH N/A 06/25/2017   Procedure: RIGHT AND LEFT HEART CATH;  Surgeon: Sherren Mocha, MD;  Location: [facility];  Service: Cardiovascular;  Laterality: N/A;  . SHOULDER ADHESION RELEASE Right ~ 2004  . SHOULDER ARTHROSCOPY W/ ROTATOR CUFF REPAIR Right ~ 2003     Current Outpatient Medications  Medication Sig Dispense Refill  . Accu-Chek Softclix Lancets lancets USE LANCETS 3 TIMES A DAY AS DIRECTED 30 DAYS    . azaTHIOprine (IMURAN) 50 MG tablet Take by mouth.    . B-D UF III MINI PEN NEEDLES 31G X 5 MM MISC 2 (two) times daily. as directed    . Blood Glucose Monitoring Suppl (ACCU-CHEK GUIDE) w/Device KIT See admin instructions.    [name] co-enzyme Q-10 50 MG capsule TAKE 1 CAPSULE BY MOUTH EVERY DAY 30 capsule 7  . diltiazem (CARDIZEM CD) 360 MG 24 hr capsule TAKE 1 CAPSULE BY MOUTH EVERY DAY 90 capsule 1  . dofetilide (TIKOSYN) 500 MCG capsule TAKE 1 CAPSULE (500 MCG TOTAL) BY MOUTH 2 (TWO) TIMES DAILY. 180 capsule 3  . ELIQUIS 5 MG TABS tablet TAKE 1 TABLET BY MOUTH TWICE A DAY 778 tablet 1  . folic acid (FOLVITE) 1 MG tablet Take 1 mg by mouth daily.    . furosemide (LASIX) 40 MG tablet Take 40 mg by mouth as needed.    [name] HUMALOG MIX 75/25 KWIKPEN (75-25) 100 UNIT/ML Kwikpen Inject 30-40 Units into the skin See admin instructions. Inject 40 units SQ in Am and 30 units SQ in PM  6  . levothyroxine (SYNTHROID, LEVOTHROID) 50 MCG tablet Take 50 mcg by mouth daily before breakfast.   6  . losartan (COZAAR) 25 MG tablet Take 1 tablet (25 mg total) by mouth daily. 90 tablet 2  . metoprolol tartrate (LOPRESSOR) 25 MG tablet TAKE 1 TABLET BY MOUTH TWICE A DAY 60 tablet 2  . nitroGLYCERIN (NITROSTAT) 0.4 MG SL tablet  Place 1 tablet (0.4 mg total) under the tongue every 5 (five) minutes as needed for chest pain. 25 tablet 3  . ONE TOUCH ULTRA TEST test strip USE AS DIRECTED 3 TIMES A DAY    . OZEMPIC, 0.25 OR 0.5 MG/DOSE, 2 MG/1.5ML SOPN INJECT 0.5 MG INTO THE SKIN ONCE A WEEK. 3 pen 3  . pravastatin (PRAVACHOL) 20 MG tablet Take 1 tablet (20 mg total) by mouth daily. 90 tablet 2  . spironolactone (ALDACTONE) 25 MG tablet TAKE 1 TABLET BY MOUTH EVERY DAY 90 tablet 3   No current facility-administered medications for this visit.    Allergies:   Hydrocodone,  Hydrocodone-acetaminophen, Morphine and related, Rosuvastatin, Lisinopril, and Nsaids   Social History:  The patient  reports that she quit smoking about 20 years ago. Her smoking use included cigarettes. She has a 1.25 pack-year smoking history. She has never used smokeless tobacco. She reports that she does not drink alcohol and does not use drugs.   Family History:  The patient's family history includes Diabetes in her father, maternal grandmother, mother, paternal grandmother, sister, sister, sister, and sister; Heart disease in her father; Hypertension in her sister; Kidney disease in her sister; Pancreatic cancer in her mother.    ROS:  Please see the history of present illness.   Otherwise, review of systems is positive for none.   All other systems are reviewed and negative.   PHYSICAL EXAM: VS:  BP 124/62   Pulse 74   Ht '5\' 7"'  (1.702 m)   Wt 240 lb (108.9 kg)   SpO2 92%   BMI 37.59 kg/m  , BMI Body mass index is 37.59 kg/m. GEN: Well nourished, well developed, in no acute distress  HEENT: normal  Neck: no JVD, carotid bruits, or masses Cardiac: RRR; no murmurs, rubs, or gallops,no edema  Respiratory:  clear to auscultation bilaterally, normal work of breathing GI: soft, nontender, nondistended, + BS MS: no deformity or atrophy  Skin: warm and dry Neuro:  Strength and sensation are intact Psych: euthymic mood, full affect  EKG:  EKG  is ordered today. Personal review of the ekg ordered shows sinus rhythm, rate 74, QTc 490 ms  Recent Labs: 01/23/2020: B Natriuretic Peptide 73.9; Magnesium 1.8; TSH 1.960 07/06/2020: ALT 11; BUN 24; Creatinine, Ser 0.98; Hemoglobin 11.9; Platelets 355; Potassium 5.1; Sodium 143    Lipid Panel     Component Value Date/Time   CHOL 131 03/09/2019 0857   TRIG 77 03/09/2019 0857   HDL 48 03/09/2019 0857   CHOLHDL 2.7 03/09/2019 0857   CHOLHDL 3.1 02/17/2019 0215   VLDL 17 02/17/2019 0215   LDLCALC 68 03/09/2019 0857     Wt Readings from Last 3 Encounters:  07/14/20 240 lb (108.9 kg)  07/06/20 238 lb (108 kg)  07/06/20 238 lb 6.4 oz (108.1 kg)      Other studies Reviewed: Additional studies/ records that were reviewed today include: TTE 02/17/19  Review of the above records today demonstrates:   1. The left ventricle has hyperdynamic systolic function, with an ejection fraction of >65%. The cavity size was normal. There is moderately increased left ventricular wall thickness. Left ventricular diastolic Doppler parameters are consistent with  pseudonormalization. Elevated left atrial and left ventricular end-diastolic pressures The E/e' is >15. No evidence of left ventricular regional wall motion abnormalities.  2. The right ventricle has normal systolic function. The cavity was normal. There is no increase in right ventricular wall thickness.  3. The mitral valve is grossly normal.  4. The tricuspid valve is grossly normal.  5. The aortic valve is tricuspid. Mild sclerosis of the aortic valve. Aortic valve regurgitation is mild to moderate by color flow Doppler. mildly increased gradient without stenosis of the aortic valve.  6. The aortic root and ascending aorta are normal in size and structure.  7. The inferior vena cava was dilated in size with <50% respiratory variability.   ASSESSMENT AND PLAN:  1.  Paroxysmal atrial fibrillation: Currently on dofetilide and Eliquis.   CHA2DS2-VASc of 4.  Wore a cardiac monitor that showed less than 1% atrial fibrillation burden.  Currently feeling well and is  comfortable with her overall control.  2.  Recurrent pericarditis: Followed in general cardiology clinic.  3.  Chronic diastolic heart failure: No evidence of volume overload  Current medicines are reviewed at length with the patient today.   The patient does not have concerns regarding her medicines.  The following changes were made today: None  Labs/ tests ordered today include:  Orders Placed This Encounter  Procedures  . EKG 12-Lead     Disposition:   FU with Mingo Siegert 6 months  Signed, Keeyon Privitera Meredith Leeds, MD  07/14/2020 10:49 AM     [facility] [address] [place] [address] [phone] (office) [phone] (fax)

## 2020-07-17 DIAGNOSIS — N183 Chronic kidney disease, stage 3 unspecified: Secondary | ICD-10-CM | POA: Insufficient documentation

## 2020-07-17 DIAGNOSIS — E1122 Type 2 diabetes mellitus with diabetic chronic kidney disease: Secondary | ICD-10-CM | POA: Insufficient documentation

## 2020-07-17 DIAGNOSIS — I1310 Hypertensive heart and chronic kidney disease without heart failure, with stage 1 through stage 4 chronic kidney disease, or unspecified chronic kidney disease: Secondary | ICD-10-CM | POA: Insufficient documentation

## 2020-08-02 ENCOUNTER — Other Ambulatory Visit: Payer: Self-pay | Admitting: Cardiology

## 2020-08-03 DIAGNOSIS — H15102 Unspecified episcleritis, left eye: Secondary | ICD-10-CM | POA: Diagnosis not present

## 2020-08-03 DIAGNOSIS — H04123 Dry eye syndrome of bilateral lacrimal glands: Secondary | ICD-10-CM | POA: Diagnosis not present

## 2020-08-03 DIAGNOSIS — H2513 Age-related nuclear cataract, bilateral: Secondary | ICD-10-CM | POA: Diagnosis not present

## 2020-08-03 DIAGNOSIS — Z794 Long term (current) use of insulin: Secondary | ICD-10-CM | POA: Diagnosis not present

## 2020-08-03 DIAGNOSIS — H31012 Macula scars of posterior pole (postinflammatory) (post-traumatic), left eye: Secondary | ICD-10-CM | POA: Diagnosis not present

## 2020-08-03 DIAGNOSIS — H10413 Chronic giant papillary conjunctivitis, bilateral: Secondary | ICD-10-CM | POA: Diagnosis not present

## 2020-08-03 DIAGNOSIS — E119 Type 2 diabetes mellitus without complications: Secondary | ICD-10-CM | POA: Diagnosis not present

## 2020-08-03 DIAGNOSIS — H40033 Anatomical narrow angle, bilateral: Secondary | ICD-10-CM | POA: Diagnosis not present

## 2020-08-03 LAB — HM DIABETES EYE EXAM

## 2020-08-09 ENCOUNTER — Encounter: Payer: Self-pay | Admitting: Internal Medicine

## 2020-08-18 DIAGNOSIS — H15102 Unspecified episcleritis, left eye: Secondary | ICD-10-CM | POA: Diagnosis not present

## 2020-08-18 DIAGNOSIS — H35033 Hypertensive retinopathy, bilateral: Secondary | ICD-10-CM | POA: Diagnosis not present

## 2020-08-18 DIAGNOSIS — H2513 Age-related nuclear cataract, bilateral: Secondary | ICD-10-CM | POA: Diagnosis not present

## 2020-08-18 DIAGNOSIS — H15002 Unspecified scleritis, left eye: Secondary | ICD-10-CM | POA: Diagnosis not present

## 2020-08-18 DIAGNOSIS — E119 Type 2 diabetes mellitus without complications: Secondary | ICD-10-CM | POA: Diagnosis not present

## 2020-08-18 DIAGNOSIS — Z79899 Other long term (current) drug therapy: Secondary | ICD-10-CM | POA: Diagnosis not present

## 2020-08-29 DIAGNOSIS — E1165 Type 2 diabetes mellitus with hyperglycemia: Secondary | ICD-10-CM | POA: Diagnosis not present

## 2020-08-29 DIAGNOSIS — E7800 Pure hypercholesterolemia, unspecified: Secondary | ICD-10-CM | POA: Diagnosis not present

## 2020-08-29 DIAGNOSIS — E039 Hypothyroidism, unspecified: Secondary | ICD-10-CM | POA: Diagnosis not present

## 2020-09-03 ENCOUNTER — Other Ambulatory Visit: Payer: Self-pay | Admitting: Cardiology

## 2020-09-04 ENCOUNTER — Encounter: Payer: Self-pay | Admitting: Internal Medicine

## 2020-09-04 ENCOUNTER — Ambulatory Visit (INDEPENDENT_AMBULATORY_CARE_PROVIDER_SITE_OTHER): Payer: Medicare PPO | Admitting: Internal Medicine

## 2020-09-04 ENCOUNTER — Other Ambulatory Visit: Payer: Self-pay

## 2020-09-04 VITALS — BP 120/66 | HR 67 | Temp 98.2°F | Ht 67.2 in | Wt 239.0 lb

## 2020-09-04 DIAGNOSIS — Z0000 Encounter for general adult medical examination without abnormal findings: Secondary | ICD-10-CM

## 2020-09-04 DIAGNOSIS — Z794 Long term (current) use of insulin: Secondary | ICD-10-CM | POA: Diagnosis not present

## 2020-09-04 DIAGNOSIS — I5032 Chronic diastolic (congestive) heart failure: Secondary | ICD-10-CM | POA: Diagnosis not present

## 2020-09-04 DIAGNOSIS — I130 Hypertensive heart and chronic kidney disease with heart failure and stage 1 through stage 4 chronic kidney disease, or unspecified chronic kidney disease: Secondary | ICD-10-CM | POA: Diagnosis not present

## 2020-09-04 DIAGNOSIS — E1122 Type 2 diabetes mellitus with diabetic chronic kidney disease: Secondary | ICD-10-CM | POA: Diagnosis not present

## 2020-09-04 DIAGNOSIS — N183 Chronic kidney disease, stage 3 unspecified: Secondary | ICD-10-CM | POA: Diagnosis not present

## 2020-09-04 DIAGNOSIS — Z6837 Body mass index (BMI) 37.0-37.9, adult: Secondary | ICD-10-CM

## 2020-09-04 LAB — POCT URINALYSIS DIPSTICK
Bilirubin, UA: NEGATIVE
Blood, UA: NEGATIVE
Glucose, UA: NEGATIVE
Nitrite, UA: NEGATIVE
Protein, UA: NEGATIVE
Spec Grav, UA: 1.025 (ref 1.010–1.025)
Urobilinogen, UA: 0.2 E.U./dL
pH, UA: 5.5 (ref 5.0–8.0)

## 2020-09-04 LAB — POCT UA - MICROALBUMIN
Albumin/Creatinine Ratio, Urine, POC: <30
Creatinine, POC: 200 mg/dL
Microalbumin Ur, POC: 10 mg/L

## 2020-09-04 MED ORDER — NITROGLYCERIN 0.4 MG SL SUBL: 0.4000 mg | SUBLINGUAL_TABLET | SUBLINGUAL | 3 refills | Status: DC | PRN

## 2020-09-04 NOTE — Progress Notes (Signed)
I,Katawbba Wiggins,acting as a Education administrator for [name], MD.,have documented all relevant documentation on the behalf of [name], MD,as directed by  [name], MD while in the presence of [name], MD.  This visit occurred during the SARS-CoV-2 public health emergency.  Safety protocols were in place, including screening questions prior to the visit, additional usage of staff PPE, and extensive cleaning of exam room while observing appropriate contact time as indicated for disinfecting solutions.  Subjective:     Patient ID: [name] , female    DOB: [birth year] , 64 y.o.   MRN: [id number]   Chief Complaint  Patient presents with  . Annual Exam  . Hypertension    HPI  She is here today for a full physical examination. She is no longer followed by GYN. She reports having a hysterectomy. She is scheduled to see Dr. [name], her endocrinologist tomorrow.   Diabetes She presents for her follow-up diabetic visit. She has type 2 diabetes mellitus. Her disease course has been stable. There are no hypoglycemic associated symptoms. Pertinent negatives for diabetes include no blurred vision and no chest pain. There are no hypoglycemic complications. Symptoms are stable. Diabetic complications include nephropathy. Risk factors for coronary artery disease include diabetes mellitus, dyslipidemia, hypertension, sedentary lifestyle, post-menopausal and obesity. She is following a diabetic diet. When asked about meal planning, she reported none. She participates in exercise intermittently. Eye exam is current.  Hypertension This is a chronic problem. The current episode started more than 1 year ago. The problem has been gradually improving since onset. The problem is controlled. Pertinent negatives include no blurred vision, chest pain, palpitations or shortness of breath. Risk factors for coronary artery disease include diabetes mellitus, obesity, dyslipidemia, sedentary lifestyle and  post-menopausal state. The current treatment provides moderate improvement. Compliance problems include exercise.  Hypertensive end-organ damage includes kidney disease.     Past Medical History:  Diagnosis Date  . Allergy   . Anemia   . Anemia   . Aortic insufficiency    a. Mod by echo 05/2015.  . Arthritis    "knees" (07/11/2015)  . Atrial fibrillation (HCC)    On Eliquis  . Carcinoma of hilus of lung ([place]) 01/20/2012   IIIB NSCL  Right hilar mass compressing esophagus/presenting with dysphagia Rx Surgery/RT/chemo dx January 2001  . Complication of anesthesia    "I had a hard time waking me up w/one of my shoulder surgeries"  . Coronary artery disease    a. Abnormal stress test -> LHc 06/2015 s/p DES to mCX and mLAD, residual D2 disease treated medially.  . Diverticulitis   . Elevated blood pressure   . Erythema nodosum   . Heart murmur   . Insomnia   . lung ca dx'd 10/1999   chemo/xrt comp 05/29/2000  . Myalgia   . Nephrolithiasis 2015  . Obesity   . Pericardial effusion    a. 05/2015 Echo: EF 55-60%, no rwma, Gr 1 DD, no effusion - but an effusion was seen on CT which was felt to be increased in size compared to 12/2014 - pericardium also thickened.   . Pericarditis    a. Dx 05/2015.  [name] Pneumothorax, spontaneous, tension 01/20/2012   October, 2010  . Pulmonary fibrosis ([place]) 01/20/2012   Due to previous surgery and chest radiation for lung cancer  . Restrictive lung disease   . Type II diabetes mellitus ([place])      Family History  Problem Relation Age  of Onset  . Heart disease Father   . Diabetes Father   . Pancreatic cancer Mother   . Diabetes Mother   . Hypertension Sister        x 3  . Diabetes Sister        x 4  . Diabetes Maternal Grandmother   . Diabetes Paternal Grandmother   . Diabetes Sister   . Diabetes Sister   . Diabetes Sister   . Kidney disease Sister   . Colon cancer Neg Hx   . Colon polyps Neg Hx   . Rectal cancer Neg Hx   . Stomach cancer Neg Hx   .  Esophageal cancer Neg Hx   . Breast cancer Neg Hx      Current Outpatient Medications:  .  Accu-Chek Softclix Lancets lancets, USE LANCETS 3 TIMES A DAY AS DIRECTED 30 DAYS, Disp: , Rfl:  .  azaTHIOprine (IMURAN) 50 MG tablet, Take by mouth. 2 per day, Disp: , Rfl:  .  B-D UF III MINI PEN NEEDLES 31G X 5 MM MISC, 2 (two) times daily. as directed, Disp: , Rfl:  .  Blood Glucose Monitoring Suppl (ACCU-CHEK GUIDE) w/Device KIT, See admin instructions., Disp: , Rfl:  .  co-enzyme Q-10 50 MG capsule, TAKE 1 CAPSULE BY MOUTH EVERY DAY, Disp: 30 capsule, Rfl: 7 .  diltiazem (CARDIZEM CD) 360 MG 24 hr capsule, TAKE 1 CAPSULE BY MOUTH EVERY DAY, Disp: 90 capsule, Rfl: 1 .  dofetilide (TIKOSYN) 500 MCG capsule, TAKE 1 CAPSULE (500 MCG TOTAL) BY MOUTH 2 (TWO) TIMES DAILY., Disp: 180 capsule, Rfl: 3 .  ELIQUIS 5 MG TABS tablet, TAKE 1 TABLET BY MOUTH TWICE A DAY, Disp: 180 tablet, Rfl: 1 .  folic acid (FOLVITE) 1 MG tablet, Take 1 mg by mouth daily., Disp: , Rfl:  .  furosemide (LASIX) 40 MG tablet, Take 40 mg by mouth as needed., Disp: , Rfl:  .  HUMALOG MIX 75/25 KWIKPEN (75-25) 100 UNIT/ML Kwikpen, Inject 30-40 Units into the skin See admin instructions. Inject 40 units SQ in Am and 30 units SQ in PM, Disp: , Rfl: 6 .  levothyroxine (SYNTHROID, LEVOTHROID) 50 MCG tablet, Take 50 mcg by mouth daily before breakfast. , Disp: , Rfl: 6 .  losartan (COZAAR) 25 MG tablet, Take 1 tablet (25 mg total) by mouth daily., Disp: 90 tablet, Rfl: 2 .  nitroGLYCERIN (NITROSTAT) 0.4 MG SL tablet, Place 1 tablet (0.4 mg total) under the tongue every 5 (five) minutes as needed for chest pain., Disp: 25 tablet, Rfl: 3 .  ONE TOUCH ULTRA TEST test strip, USE AS DIRECTED 3 TIMES A DAY, Disp: , Rfl:  .  spironolactone (ALDACTONE) 25 MG tablet, TAKE 1 TABLET BY MOUTH EVERY DAY, Disp: 90 tablet, Rfl: 2 .  metoprolol tartrate (LOPRESSOR) 25 MG tablet, TAKE 1 TABLET BY MOUTH TWICE A DAY, Disp: 180 tablet, Rfl: 3 .  OZEMPIC, 0.25  OR 0.5 MG/DOSE, 2 MG/1.5ML SOPN, INJECT 0.5 MG INTO THE SKIN ONCE A WEEK. (Patient not taking: Reported on 09/04/2020), Disp: 3 pen, Rfl: 3 .  pravastatin (PRAVACHOL) 20 MG tablet, TAKE 1 TABLET BY MOUTH EVERY DAY, Disp: 90 tablet, Rfl: 2   Allergies  Allergen Reactions  . Hydrocodone Hives and Itching  . Hydrocodone-Acetaminophen Hives  . Morphine And Related Other (See Comments)    GI PROBLEMS  . Rosuvastatin Other (See Comments)    Pt reports causes lower extremity muscle aches/cramping  . Lisinopril Cough  .  Nsaids Other (See Comments)    GI PROBLEMS      The patient states she uses none for birth control. Last LMP was No LMP recorded. Patient has had a hysterectomy.. Negative for Dysmenorrhea. Negative for: breast discharge, breast lump(s), breast pain and breast self exam. Associated symptoms include abnormal vaginal bleeding. Pertinent negatives include abnormal bleeding (hematology), anxiety, decreased libido, depression, difficulty falling sleep, dyspareunia, history of infertility, nocturia, sexual dysfunction, sleep disturbances, urinary incontinence, urinary urgency, vaginal discharge and vaginal itching. Diet regular.The patient states her exercise level is  intermittent.   . The patient's tobacco use is:  Social History   Tobacco Use  Smoking Status Former Smoker  . Packs/day: 0.25  . Years: 5.00  . Pack years: 1.25  . Types: Cigarettes  . Quit date: 07/22/1999  . Years since quitting: 21.2  Smokeless Tobacco Never Used  . She has been exposed to passive smoke. The patient's alcohol use is:  Social History   Substance and Sexual Activity  Alcohol Use No  . Alcohol/week: 0.0 standard drinks    Review of Systems  Constitutional: Negative.   HENT: Negative.   Eyes: Negative.  Negative for blurred vision.  Respiratory: Negative.  Negative for shortness of breath.   Cardiovascular: Negative.  Negative for chest pain and palpitations.  Gastrointestinal: Negative.    Endocrine: Negative.   Genitourinary: Negative.   Musculoskeletal: Negative.   Skin: Negative.   Allergic/Immunologic: Negative.   Neurological: Negative.   Hematological: Negative.   Psychiatric/Behavioral: Negative.      Today's Vitals   09/04/20 1412  BP: 120/66  Pulse: 67  Temp: 98.2 F (36.8 C)  TempSrc: Oral  Weight: 239 lb (108.4 kg)  Height: 5' 7.2" (1.707 m)   Body mass index is 37.21 kg/m.   Objective:  Physical Exam Constitutional:      General: She is not in acute distress.    Appearance: Normal appearance. She is well-developed. She is obese.  HENT:     Head: Normocephalic and atraumatic.     Right Ear: Hearing, tympanic membrane, ear canal and external ear normal. There is no impacted cerumen.     Left Ear: Hearing, tympanic membrane, ear canal and external ear normal. There is no impacted cerumen.     Nose:     Comments: Deferred, masked    Mouth/Throat:     Comments: Deferred, masked Eyes:     General: Lids are normal.     Extraocular Movements: Extraocular movements intact.     Conjunctiva/sclera: Conjunctivae normal.     Pupils: Pupils are equal, round, and reactive to light.     Funduscopic exam:    Right eye: No papilledema.        Left eye: No papilledema.  Neck:     Thyroid: No thyroid mass.     Vascular: No carotid bruit.  Cardiovascular:     Rate and Rhythm: Normal rate and regular rhythm.     Pulses: Normal pulses.          Dorsalis pedis pulses are 2+ on the right side and 2+ on the left side.  Pulmonary:     Effort: Pulmonary effort is normal.     Breath sounds: Normal breath sounds.  Chest:  Breasts:     Tanner Score is 5.     Right: Normal.     Left: Normal.    Abdominal:     General: Bowel sounds are normal. There is no distension.  Palpations: Abdomen is soft.     Tenderness: There is no abdominal tenderness.     Comments: Rounded, soft  Musculoskeletal:        General: No swelling. Normal range of motion.      Cervical back: Full passive range of motion without pain, normal range of motion and neck supple.     Right lower leg: No edema.     Left lower leg: No edema.  Feet:     Right foot:     Protective Sensation: 5 sites tested. 5 sites sensed.     Skin integrity: Dry skin present.     Toenail Condition: Right toenails are abnormally thick.     Left foot:     Protective Sensation: 5 sites tested. 5 sites sensed.     Skin integrity: Dry skin present.     Toenail Condition: Left toenails are abnormally thick.  Skin:    General: Skin is warm and dry.     Capillary Refill: Capillary refill takes less than 2 seconds.  Neurological:     General: No focal deficit present.     Mental Status: She is alert and oriented to person, place, and time.     Cranial Nerves: No cranial nerve deficit.     Sensory: No sensory deficit.  Psychiatric:        Mood and Affect: Mood normal.        Behavior: Behavior normal.        Thought Content: Thought content normal.        Judgment: Judgment normal.         Assessment And Plan:     1. Routine general medical examination at a health care facility Comments: A full exam was performed. Importance of monthly self breast exams was discussed with the patient.PATIENT IS ADVISED TO GET 30-45 MINUTES REGULAR EXERCISE NO LESS THAN FOUR TO FIVE DAYS PER WEEK - BOTH WEIGHTBEARING EXERCISES AND AEROBIC ARE RECOMMENDED.  PATIENT IS ADVISED TO FOLLOW A HEALTHY DIET WITH AT LEAST SIX FRUITS/VEGGIES PER DAY, DECREASE INTAKE OF RED MEAT, AND TO INCREASE FISH INTAKE TO TWO DAYS PER WEEK.  MEATS/FISH SHOULD NOT BE FRIED, BAKED OR BROILED IS PREFERABLE.  I SUGGEST WEARING SPF 50 SUNSCREEN ON EXPOSED PARTS AND ESPECIALLY WHEN IN THE DIRECT SUNLIGHT FOR AN EXTENDED PERIOD OF TIME.  PLEASE AVOID FAST FOOD RESTAURANTS AND INCREASE YOUR WATER INTAKE.   2. Hypertensive heart and renal disease with renal failure, stage 1 through stage 4 or unspecified chronic kidney disease, with heart  failure ([place]) Comments: Chronic, well controlled. She will continue with current meds. She is encouraged to avoid adding salt to her foods. EKG not performed, she had one 06/2020 w/ Cardiology. She will rto in six months for re-evaluation.  - CBC - CMP14+EGFR - Lipid panel  3. Chronic diastolic (congestive) heart failure (HCC) Comments: Chronic, stable. Encouraged to follow low sodium diet. Last echo performed April 2020.  Recent Cardiology notes reviewed in detail.   4. Type 2 diabetes mellitus with stage 3 chronic kidney disease, with long-term current use of insulin, unspecified whether stage 3a or 3b CKD ([place]) Comments: Diabetic foot exam was performed.  Importance of statin compliance was discussed with the patient.  I DISCUSSED WITH THE PATIENT AT LENGTH REGARDING THE GOALS OF GLYCEMIC CONTROL AND POSSIBLE LONG-TERM COMPLICATIONS.  I  ALSO STRESSED THE IMPORTANCE OF COMPLIANCE WITH HOME GLUCOSE MONITORING, DIETARY RESTRICTIONS INCLUDING AVOIDANCE OF SUGARY DRINKS/PROCESSED FOODS,  ALONG WITH REGULAR EXERCISE.  I  ALSO STRESSED THE IMPORTANCE OF ANNUAL EYE EXAMS, SELF FOOT CARE AND COMPLIANCE WITH OFFICE VISITS.  - POCT Urinalysis Dipstick (81002) - POCT UA - Microalbumin  5. Class 2 severe obesity due to excess calories with serious comorbidity and body mass index (BMI) of 37.0 to 37.9 in adult [facility]) Comments: She is encouraged to initially strive for BMI less than 30 to decrease cardiac risk. Advised to aim for at least 150 minutes of exercise per week.      Patient was given opportunity to ask questions. Patient verbalized understanding of the plan and was able to repeat key elements of the plan. All questions were answered to their satisfaction.   [name], MD   I, [name], MD, have reviewed all documentation for this visit. The documentation on 09/30/20 for the exam, diagnosis, procedures, and orders are all accurate and complete.  THE PATIENT IS ENCOURAGED TO  PRACTICE SOCIAL DISTANCING DUE TO THE COVID-19 PANDEMIC.

## 2020-09-04 NOTE — Patient Instructions (Signed)
Health Maintenance, Female Adopting a healthy lifestyle and getting preventive care are important in promoting health and wellness. Ask your health care provider about:  The right schedule for you to have regular tests and exams.  Things you can do on your own to prevent diseases and keep yourself healthy. What should I know about diet, weight, and exercise? Eat a healthy diet   Eat a diet that includes plenty of vegetables, fruits, low-fat dairy products, and lean protein.  Do not eat a lot of foods that are high in solid fats, added sugars, or sodium. Maintain a healthy weight Body mass index (BMI) is used to identify weight problems. It estimates body fat based on height and weight. Your health care provider can help determine your BMI and help you achieve or maintain a healthy weight. Get regular exercise Get regular exercise. This is one of the most important things you can do for your health. Most adults should:  Exercise for at least 150 minutes each week. The exercise should increase your heart rate and make you sweat (moderate-intensity exercise).  Do strengthening exercises at least twice a week. This is in addition to the moderate-intensity exercise.  Spend less time sitting. Even light physical activity can be beneficial. Watch cholesterol and blood lipids Have your blood tested for lipids and cholesterol at 62 years of age, then have this test every 5 years. Have your cholesterol levels checked more often if:  Your lipid or cholesterol levels are high.  You are older than 62 years of age.  You are at high risk for heart disease. What should I know about cancer screening? Depending on your health history and family history, you may need to have cancer screening at various ages. This may include screening for:  Breast cancer.  Cervical cancer.  Colorectal cancer.  Skin cancer.  Lung cancer. What should I know about heart disease, diabetes, and high blood  pressure? Blood pressure and heart disease  High blood pressure causes heart disease and increases the risk of stroke. This is more likely to develop in people who have high blood pressure readings, are of African descent, or are overweight.  Have your blood pressure checked: ? Every 3-5 years if you are 18-39 years of age. ? Every year if you are 40 years old or older. Diabetes Have regular diabetes screenings. This checks your fasting blood sugar level. Have the screening done:  Once every three years after age 40 if you are at a normal weight and have a low risk for diabetes.  More often and at a younger age if you are overweight or have a high risk for diabetes. What should I know about preventing infection? Hepatitis B If you have a higher risk for hepatitis B, you should be screened for this virus. Talk with your health care provider to find out if you are at risk for hepatitis B infection. Hepatitis C Testing is recommended for:  Everyone born from 1945 through 1965.  Anyone with known risk factors for hepatitis C. Sexually transmitted infections (STIs)  Get screened for STIs, including gonorrhea and chlamydia, if: ? You are sexually active and are younger than 62 years of age. ? You are older than 62 years of age and your health care provider tells you that you are at risk for this type of infection. ? Your sexual activity has changed since you were last screened, and you are at increased risk for chlamydia or gonorrhea. Ask your health care provider if   you are at risk.  Ask your health care provider about whether you are at high risk for HIV. Your health care provider may recommend a prescription medicine to help prevent HIV infection. If you choose to take medicine to prevent HIV, you should first get tested for HIV. You should then be tested every 3 months for as long as you are taking the medicine. Pregnancy  If you are about to stop having your period (premenopausal) and  you may become pregnant, seek counseling before you get pregnant.  Take 400 to 800 micrograms (mcg) of folic acid every day if you become pregnant.  Ask for birth control (contraception) if you want to prevent pregnancy. Osteoporosis and menopause Osteoporosis is a disease in which the bones lose minerals and strength with aging. This can result in bone fractures. If you are 65 years old or older, or if you are at risk for osteoporosis and fractures, ask your health care provider if you should:  Be screened for bone loss.  Take a calcium or vitamin D supplement to lower your risk of fractures.  Be given hormone replacement therapy (HRT) to treat symptoms of menopause. Follow these instructions at home: Lifestyle  Do not use any products that contain nicotine or tobacco, such as cigarettes, e-cigarettes, and chewing tobacco. If you need help quitting, ask your health care provider.  Do not use street drugs.  Do not share needles.  Ask your health care provider for help if you need support or information about quitting drugs. Alcohol use  Do not drink alcohol if: ? Your health care provider tells you not to drink. ? You are pregnant, may be pregnant, or are planning to become pregnant.  If you drink alcohol: ? Limit how much you use to 0-1 drink a day. ? Limit intake if you are breastfeeding.  Be aware of how much alcohol is in your drink. In the [place] one drink equals one 12 oz bottle of beer (355 mL), one 5 oz glass of wine (148 mL), or one 1 oz glass of hard liquor (44 mL). General instructions  Schedule regular health, dental, and eye exams.  Stay current with your vaccines.  Tell your health care provider if: ? You often feel depressed. ? You have ever been abused or do not feel safe at home. Summary  Adopting a healthy lifestyle and getting preventive care are important in promoting health and wellness.  Follow your health care provider's instructions about healthy  diet, exercising, and getting tested or screened for diseases.  Follow your health care provider's instructions on monitoring your cholesterol and blood pressure. This information is not intended to replace advice given to you by your health care provider. Make sure you discuss any questions you have with your health care provider. Document Revised: 09/30/2018 Document Reviewed: 09/30/2018 Elsevier Patient Education  2020 [employer] Inc.  

## 2020-09-05 DIAGNOSIS — I2510 Atherosclerotic heart disease of native coronary artery without angina pectoris: Secondary | ICD-10-CM | POA: Diagnosis not present

## 2020-09-05 DIAGNOSIS — E039 Hypothyroidism, unspecified: Secondary | ICD-10-CM | POA: Diagnosis not present

## 2020-09-05 DIAGNOSIS — E1165 Type 2 diabetes mellitus with hyperglycemia: Secondary | ICD-10-CM | POA: Diagnosis not present

## 2020-09-05 DIAGNOSIS — E7800 Pure hypercholesterolemia, unspecified: Secondary | ICD-10-CM | POA: Diagnosis not present

## 2020-09-05 DIAGNOSIS — I10 Essential (primary) hypertension: Secondary | ICD-10-CM | POA: Diagnosis not present

## 2020-09-05 LAB — CMP14+EGFR
ALT: 6 IU/L (ref 0–32)
AST: 12 IU/L (ref 0–40)
Albumin/Globulin Ratio: 1.6 (ref 1.2–2.2)
Albumin: 4.4 g/dL (ref 3.8–4.8)
Alkaline Phosphatase: 105 IU/L (ref 44–121)
BUN/Creatinine Ratio: 20 (ref 12–28)
BUN: 21 mg/dL (ref 8–27)
Bilirubin Total: 0.2 mg/dL (ref 0.0–1.2)
CO2: 25 mmol/L (ref 20–29)
Calcium: 9.6 mg/dL (ref 8.7–10.3)
Chloride: 102 mmol/L (ref 96–106)
Creatinine, Ser: 1.04 mg/dL — ABNORMAL HIGH (ref 0.57–1.00)
GFR calc Af Amer: 67 mL/min/{1.73_m2} (ref >59)
GFR calc non Af Amer: 58 mL/min/{1.73_m2} — ABNORMAL LOW (ref >59)
Globulin, Total: 2.7 g/dL (ref 1.5–4.5)
Glucose: 60 mg/dL — ABNORMAL LOW (ref 65–99)
Potassium: 4.1 mmol/L (ref 3.5–5.2)
Sodium: 139 mmol/L (ref 134–144)
Total Protein: 7.1 g/dL (ref 6.0–8.5)

## 2020-09-05 LAB — CBC
Hematocrit: 35.4 % (ref 34.0–46.6)
Hemoglobin: 11.7 g/dL (ref 11.1–15.9)
MCH: 31.9 pg (ref 26.6–33.0)
MCHC: 33.1 g/dL (ref 31.5–35.7)
MCV: 97 fL (ref 79–97)
Platelets: 359 10*3/uL (ref 150–450)
RBC: 3.67 x10E6/uL — ABNORMAL LOW (ref 3.77–5.28)
RDW: 12.1 % (ref 11.7–15.4)
WBC: 7.1 10*3/uL (ref 3.4–10.8)

## 2020-09-05 LAB — LIPID PANEL
Chol/HDL Ratio: 3.5 ratio (ref 0.0–4.4)
Cholesterol, Total: 163 mg/dL (ref 100–199)
HDL: 47 mg/dL (ref >39)
LDL Chol Calc (NIH): 91 mg/dL (ref 0–99)
Triglycerides: 141 mg/dL (ref 0–149)
VLDL Cholesterol Cal: 25 mg/dL (ref 5–40)

## 2020-09-06 ENCOUNTER — Telehealth: Payer: Self-pay

## 2020-09-06 NOTE — Telephone Encounter (Signed)
-----   Message from Glendale Chard, MD sent at 09/05/2020 10:52 AM EST ----- Your blood count is stable. Your kidney function is slightly decreased from last visit. Be sure to stay well hydrated.    Your LDL, bad cholesterol is 91- as someone with diabetes, it should be 70 or less. I would like to increase the dose of pravastatin. Please start taking 2 tablets (40mg ) until you run out. We need to recheck cholesterol in six weeks. I will see you again at that time (the first of the year).

## 2020-09-06 NOTE — Telephone Encounter (Signed)
Left the patient a message to call back for lab results. 

## 2020-09-08 LAB — BASIC METABOLIC PANEL
BUN: 20 (ref 4–21)
CO2: 28 — AB (ref 13–22)
Chloride: 110 — AB (ref 99–108)
Creatinine: 1.2 — AB (ref 0.5–1.1)
Glucose: 69
Potassium: 5.1 (ref 3.4–5.3)
Sodium: 146 (ref 137–147)

## 2020-09-08 LAB — COMPREHENSIVE METABOLIC PANEL
Calcium: 9 (ref 8.7–10.7)
GFR calc Af Amer: 58.36
GFR calc non Af Amer: 48.23

## 2020-09-08 LAB — LIPID PANEL
Cholesterol: 142 (ref 0–200)
HDL: 54 (ref 35–70)
LDl/HDL Ratio: 1.4
Triglycerides: 75 (ref 40–160)

## 2020-09-08 LAB — HEMOGLOBIN A1C: Hemoglobin A1C: 6.6

## 2020-09-08 LAB — TSH: TSH: 2.62 (ref 0.41–5.90)

## 2020-09-18 ENCOUNTER — Telehealth: Payer: Self-pay | Admitting: Cardiology

## 2020-09-18 NOTE — Telephone Encounter (Signed)
[name] is calling wanting to know Dr. Curt Bears opinion on her getting the booster shot. She states when she received her second dose of the Covid vaccine on 01/19/20 and ended up in the hospital the following week. Please advise.

## 2020-09-19 DIAGNOSIS — H15002 Unspecified scleritis, left eye: Secondary | ICD-10-CM | POA: Diagnosis not present

## 2020-09-19 DIAGNOSIS — E1136 Type 2 diabetes mellitus with diabetic cataract: Secondary | ICD-10-CM | POA: Diagnosis not present

## 2020-09-19 DIAGNOSIS — H15102 Unspecified episcleritis, left eye: Secondary | ICD-10-CM | POA: Diagnosis not present

## 2020-09-19 DIAGNOSIS — H35033 Hypertensive retinopathy, bilateral: Secondary | ICD-10-CM | POA: Diagnosis not present

## 2020-09-19 DIAGNOSIS — H2513 Age-related nuclear cataract, bilateral: Secondary | ICD-10-CM | POA: Diagnosis not present

## 2020-09-19 DIAGNOSIS — Z79899 Other long term (current) drug therapy: Secondary | ICD-10-CM | POA: Diagnosis not present

## 2020-09-19 NOTE — Telephone Encounter (Signed)
Pt advised to get booster shot, per [place] advisement/recommendation Pt agreeable to plan.

## 2020-10-03 DIAGNOSIS — M25562 Pain in left knee: Secondary | ICD-10-CM | POA: Diagnosis not present

## 2020-10-03 DIAGNOSIS — M1712 Unilateral primary osteoarthritis, left knee: Secondary | ICD-10-CM | POA: Diagnosis not present

## 2020-10-05 ENCOUNTER — Other Ambulatory Visit: Payer: Self-pay | Admitting: Cardiology

## 2020-10-06 NOTE — Telephone Encounter (Signed)
Pt last saw Dr Curt Bears 07/14/20, last labs 09/08/20 Creat 1.2, age 64, weight 108.4kg, based on specified criteria pt is on appropriate dosage of Eliquis 5mg  BID.  Will refill rx.

## 2020-10-28 IMAGING — DX PORTABLE CHEST - 1 VIEW
1 series · 1 of 1 positions shown · non-contrast
Comparison: Prior radiograph from 12/29/2018

CLINICAL DATA: Initial evaluation for acute chest pain.

EXAM:
PORTABLE CHEST 1 VIEW

[chest ap]
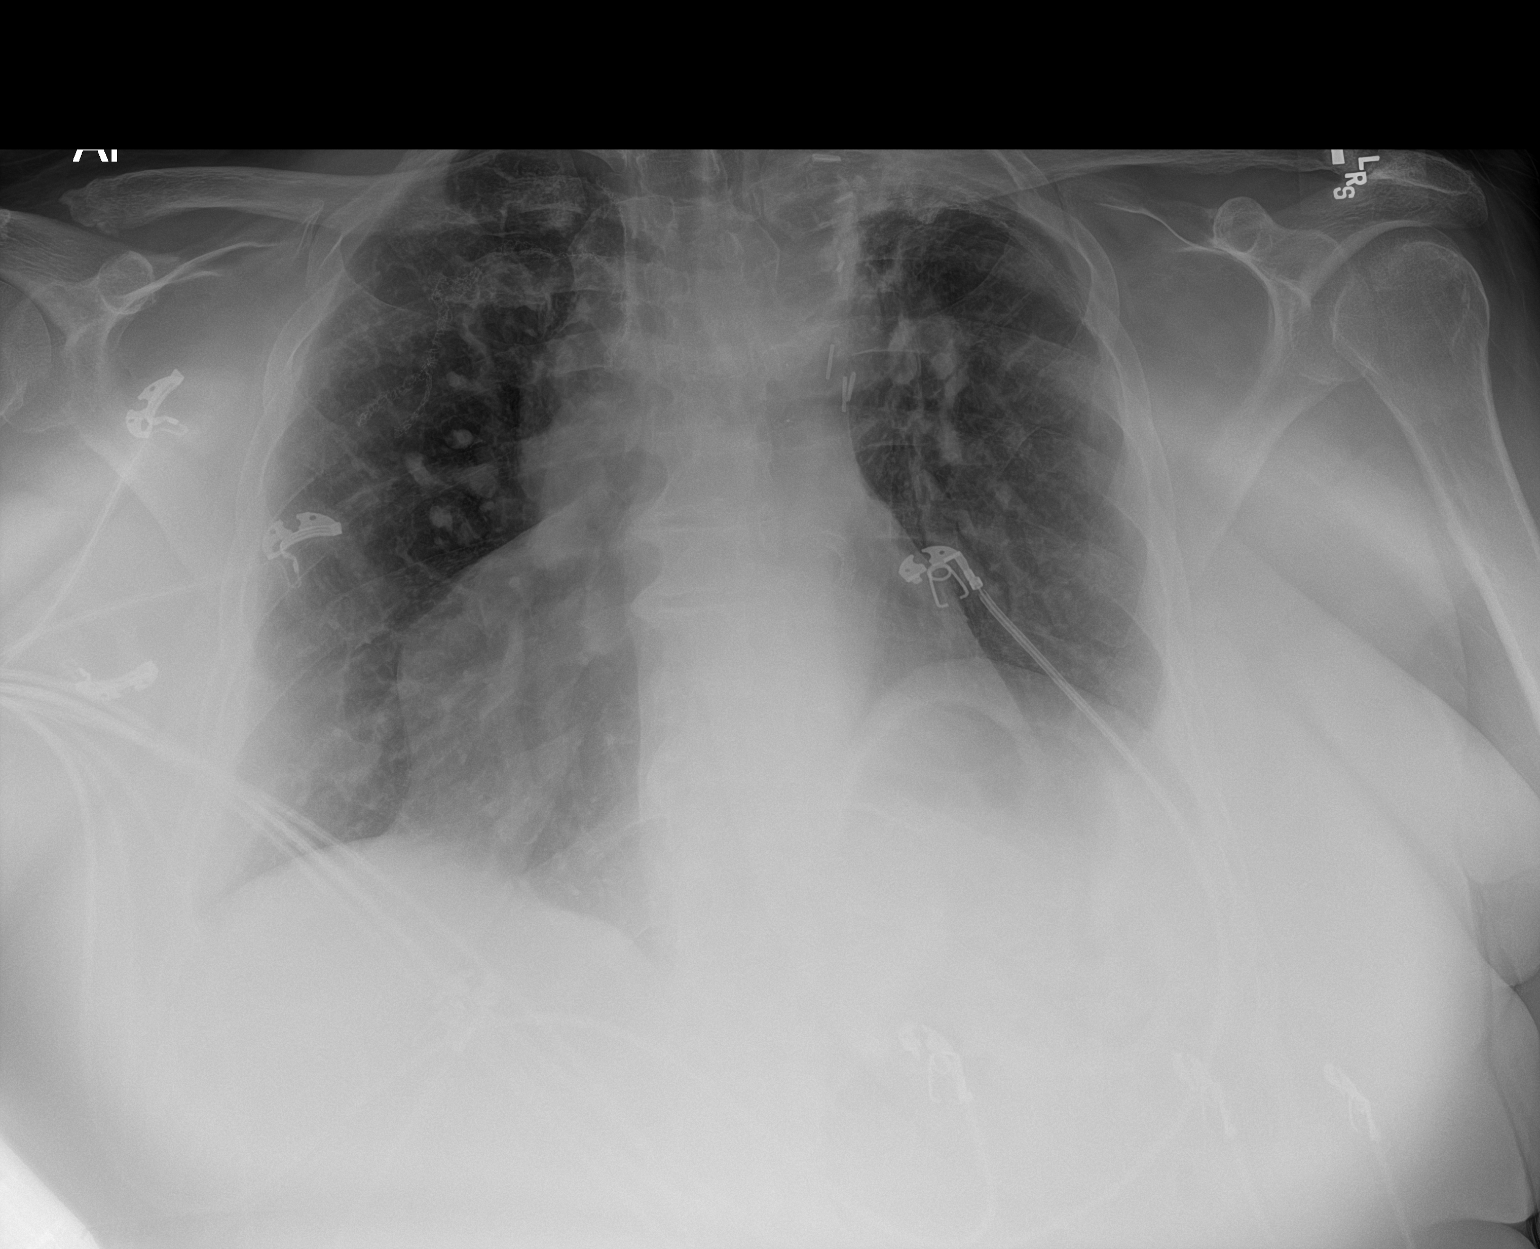

[1 of 1 positions shown; findings below may reference images not displayed]

FINDINGS: Cardiomegaly with prominence of the right atrial contour, stable.
Coronary stent in place. Mediastinal silhouette within normal
limits.

Elevation of the left hemidiaphragm with postsurgical changes at the
left lung apex, stable. Additional postsurgical changes present at
the right upper lobe. No focal infiltrates. No edema or effusion. No
visible pneumothorax.

No acute osseous finding.
IMPRESSION: 1. No active cardiopulmonary disease.
2. Chronic postsurgical changes involving the upper lobes
bilaterally, left greater than right, stable.
3. Cardiomegaly, unchanged.

## 2020-11-21 DIAGNOSIS — Z20822 Contact with and (suspected) exposure to covid-19: Secondary | ICD-10-CM | POA: Diagnosis not present

## 2020-11-22 ENCOUNTER — Telehealth: Payer: Self-pay | Admitting: *Deleted

## 2020-11-22 NOTE — Telephone Encounter (Signed)
   Primary Cardiologist: Ena Dawley, MD  Chart reviewed as part of pre-operative protocol coverage.    Simple dental extractions are considered low risk procedures per guidelines and generally do not require any specific cardiac clearance. It is also generally accepted that for simple extractions and dental cleanings, there is no need to interrupt blood thinner therapy.  SBE prophylaxis is not required for the patient from a cardiac standpoint.  I will route this recommendation to the requesting party via Epic fax function and remove from pre-op pool.  Please call with questions.  Kerin Ransom, PA-C 11/22/2020, 3:56 PM

## 2020-11-22 NOTE — Telephone Encounter (Signed)
    [facility] Pre-operative Risk Assessment    HEARTCARE STAFF: - Please ensure there is not already an duplicate clearance open for this procedure. - Under Visit Info/Reason for Call, type in Other and utilize the format Clearance MM/DD/YY or Clearance TBD. Do not use dashes or single digits. - If request is for dental extraction, please clarify the # of teeth to be extracted.  Request for surgical clearance:  1. What type of surgery is being performed? 1 TOOTH TO BE EXTRACTED   2. When is this surgery scheduled? TBD   3. What type of clearance is required (medical clearance vs. Pharmacy clearance to hold med vs. Both)? BOTH  4. Are there any medications that need to be held prior to surgery and how long? ELIQUIS   5. Practice name and name of physician performing surgery? [facility], [place]; DR. Romie Minus   6. What is the office phone number? [phone]   7.   What is the office fax number? [phone]  8.   Anesthesia type (None, local, MAC, general) ? NOT LISTED   [name] 11/22/2020, 3:16 PM  _________________________________________________________________   (provider comments below)

## 2020-12-07 NOTE — Telephone Encounter (Signed)
Our office received another request. I see that clearance was faxed on 11/22/20 giving clearance. I will re-fax today to fax # [phone].

## 2020-12-14 ENCOUNTER — Ambulatory Visit: Payer: Medicare PPO | Admitting: Pulmonary Disease

## 2020-12-15 ENCOUNTER — Other Ambulatory Visit: Payer: Self-pay

## 2020-12-15 ENCOUNTER — Other Ambulatory Visit: Payer: Self-pay | Admitting: Internal Medicine

## 2020-12-15 ENCOUNTER — Encounter: Payer: Self-pay | Admitting: Pulmonary Disease

## 2020-12-15 ENCOUNTER — Ambulatory Visit: Payer: Medicare PPO | Admitting: Pulmonary Disease

## 2020-12-15 DIAGNOSIS — J984 Other disorders of lung: Secondary | ICD-10-CM

## 2020-12-15 DIAGNOSIS — R400 Somnolence: Secondary | ICD-10-CM | POA: Diagnosis not present

## 2020-12-15 DIAGNOSIS — Z1231 Encounter for screening mammogram for malignant neoplasm of breast: Secondary | ICD-10-CM

## 2020-12-15 DIAGNOSIS — I480 Paroxysmal atrial fibrillation: Secondary | ICD-10-CM

## 2020-12-15 NOTE — Assessment & Plan Note (Signed)
She has very mild OSA and advised weight loss is the most important intervention Do not feel that she needs oral appliance or CPAP therapy at this time

## 2020-12-15 NOTE — Patient Instructions (Signed)
Ambulatory sat

## 2020-12-15 NOTE — Progress Notes (Signed)
   Subjective:    Patient ID: [name], female    DOB: 11/21/1955, 65 y.o.   MRN: [id number]  HPI  65 yo never smoker for FU of dyspnea attributed to restrictive lung disease  PMH - LUlobectomy for spont pneumothx in 07/2006, s/p chemo/ XRT for stg III B lung cancer in 2001 - in remission since!  - chronic pericarditis since 2016, has received NSAIDs, colchicine and steroids, was referred to [facility]. -CAD -cardiac cath 06/2015 required 2 stents,repeat 06/2017,  moderate aortic regurgitation -Paroxysmal atrial fibrillation - EGD 11/2017 which showed mild narrowing of the mid esophagus without mass.  She continues to complain of shortness of breath.  She feels increased palpitations and fluttering in her chest.  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  Denies fevers, cough or wheezing. No pedal edema, compliant with furosemide on an as-needed basis  Significant tests/ events reviewed  CT chest with contrast 01/2020  >>esophageal air fluid level suggests dysmotility or gastroesophageal reflux  02/2020 HST AHI 6/h  Review of Systems neg for any significant sore throat, dysphagia, itching, sneezing, nasal congestion or excess/ purulent secretions, fever, chills, sweats, unintended wt loss, pleuritic or exertional cp, hempoptysis, orthopnea pnd or change in chronic leg swelling. Also denies presyncope, palpitations, heartburn, abdominal pain, nausea, vomiting, diarrhea or change in bowel or urinary habits, dysuria,hematuria, rash, arthralgias, visual complaints, headache, numbness weakness or ataxia.     Objective:   Physical Exam  Gen. Pleasant, obese, in no distress ENT - no lesions, no post nasal drip Neck: No JVD, no thyromegaly, no carotid bruits Lungs: no use of accessory muscles, no dullness to percussion, decreased without rales or rhonchi  Cardiovascular: Rhythm regular, heart  sounds  normal, no murmurs or gallops, no peripheral edema Musculoskeletal: No deformities, no cyanosis or clubbing , no tremors       Assessment & Plan:

## 2020-12-15 NOTE — Assessment & Plan Note (Signed)
This is related to prior radiation and surgery, obesity and deconditioning contribute to dyspnea Check ambulatory saturation today, does not need oxygen I offered her pulmonary rehab, she will think about this and get back to [place]

## 2020-12-15 NOTE — Assessment & Plan Note (Signed)
No evidence of overt heart failure, she is on metoprolol and Cardizem, she will make appointment for cardiology follow-up

## 2020-12-26 DIAGNOSIS — H15102 Unspecified episcleritis, left eye: Secondary | ICD-10-CM | POA: Diagnosis not present

## 2020-12-26 DIAGNOSIS — H15002 Unspecified scleritis, left eye: Secondary | ICD-10-CM | POA: Diagnosis not present

## 2021-01-05 DIAGNOSIS — H35033 Hypertensive retinopathy, bilateral: Secondary | ICD-10-CM | POA: Diagnosis not present

## 2021-01-05 DIAGNOSIS — H15102 Unspecified episcleritis, left eye: Secondary | ICD-10-CM | POA: Diagnosis not present

## 2021-01-05 DIAGNOSIS — E119 Type 2 diabetes mellitus without complications: Secondary | ICD-10-CM | POA: Diagnosis not present

## 2021-01-05 DIAGNOSIS — Z7984 Long term (current) use of oral hypoglycemic drugs: Secondary | ICD-10-CM | POA: Diagnosis not present

## 2021-01-05 DIAGNOSIS — H15002 Unspecified scleritis, left eye: Secondary | ICD-10-CM | POA: Diagnosis not present

## 2021-01-05 DIAGNOSIS — Z79899 Other long term (current) drug therapy: Secondary | ICD-10-CM | POA: Diagnosis not present

## 2021-01-05 DIAGNOSIS — H2513 Age-related nuclear cataract, bilateral: Secondary | ICD-10-CM | POA: Diagnosis not present

## 2021-01-12 ENCOUNTER — Other Ambulatory Visit: Payer: Self-pay | Admitting: Cardiology

## 2021-01-15 ENCOUNTER — Other Ambulatory Visit: Payer: Self-pay | Admitting: Cardiology

## 2021-01-30 ENCOUNTER — Other Ambulatory Visit: Payer: Self-pay

## 2021-01-30 ENCOUNTER — Other Ambulatory Visit (HOSPITAL_COMMUNITY)
Admission: RE | Admit: 2021-01-30 | Discharge: 2021-01-30 | Disposition: A | Discharge status: Home / Self Care | Payer: Medicare PPO | Source: Ambulatory Visit | Attending: Cardiology | Admitting: Cardiology

## 2021-01-30 ENCOUNTER — Ambulatory Visit: Payer: Medicare PPO | Admitting: Cardiology

## 2021-01-30 ENCOUNTER — Encounter: Payer: Self-pay | Admitting: Cardiology

## 2021-01-30 VITALS — BP 124/72 | HR 68 | Ht 67.5 in | Wt 242.0 lb

## 2021-01-30 DIAGNOSIS — R079 Chest pain, unspecified: Secondary | ICD-10-CM | POA: Diagnosis not present

## 2021-01-30 DIAGNOSIS — Z01812 Encounter for preprocedural laboratory examination: Secondary | ICD-10-CM | POA: Insufficient documentation

## 2021-01-30 DIAGNOSIS — Z20822 Contact with and (suspected) exposure to covid-19: Secondary | ICD-10-CM | POA: Insufficient documentation

## 2021-01-30 DIAGNOSIS — I4819 Other persistent atrial fibrillation: Secondary | ICD-10-CM

## 2021-01-30 LAB — BASIC METABOLIC PANEL
BUN/Creatinine Ratio: 22 (ref 12–28)
BUN: 23 mg/dL (ref 8–27)
CO2: 21 mmol/L (ref 20–29)
Calcium: 9.2 mg/dL (ref 8.7–10.3)
Chloride: 103 mmol/L (ref 96–106)
Creatinine, Ser: 1.04 mg/dL — ABNORMAL HIGH (ref 0.57–1.00)
Glucose: 48 mg/dL — ABNORMAL LOW (ref 65–99)
Potassium: 4.5 mmol/L (ref 3.5–5.2)
Sodium: 139 mmol/L (ref 134–144)
eGFR: 61 mL/min/{1.73_m2} (ref >59)

## 2021-01-30 LAB — SARS CORONAVIRUS 2 (TAT 6-24 HRS): SARS Coronavirus 2: NEGATIVE

## 2021-01-30 LAB — CBC
Hematocrit: 33.8 % — ABNORMAL LOW (ref 34.0–46.6)
Hemoglobin: 11.2 g/dL (ref 11.1–15.9)
MCH: 32.4 pg (ref 26.6–33.0)
MCHC: 33.1 g/dL (ref 31.5–35.7)
MCV: 98 fL — ABNORMAL HIGH (ref 79–97)
Platelets: 292 10*3/uL (ref 150–450)
RBC: 3.46 x10E6/uL — ABNORMAL LOW (ref 3.77–5.28)
RDW: 11.9 % (ref 11.7–15.4)
WBC: 5.9 10*3/uL (ref 3.4–10.8)

## 2021-01-30 MED ORDER — NITROGLYCERIN 0.4 MG SL SUBL: 0.4000 mg | SUBLINGUAL_TABLET | SUBLINGUAL | 3 refills | Status: DC | PRN

## 2021-01-30 NOTE — Patient Instructions (Addendum)
Medication Instructions:  Your physician recommends that you continue on your current medications as directed. Please refer to the Current Medication list given to you today.  *If you need a refill on your cardiac medications before your next appointment, please call your pharmacy*   Lab Work: Pre procedure labs today: BMET & CBC If you have labs (blood work) drawn today and your tests are completely normal, you will receive your results only by: [name] MyChart Message (if you have MyChart) OR . A paper copy in the mail If you have any lab test that is abnormal or we need to change your treatment, we will call you to review the results.   Testing/Procedures: Your physician has requested that you have a cardiac catheterization. Cardiac catheterization is used to diagnose and/or treat various heart conditions. Doctors may recommend this procedure for a number of different reasons. The most common reason is to evaluate chest pain. Chest pain can be a symptom of coronary artery disease (CAD), and cardiac catheterization can show whether plaque is narrowing or blocking your heart's arteries. This procedure is also used to evaluate the valves, as well as measure the blood flow and oxygen levels in different parts of your heart. For further information please visit [email or web address]. Please follow instruction sheet below under "other instructions".     Follow-Up: At [facility], you and your health needs are our priority.  As part of our continuing mission to provide you with exceptional heart care, we have created designated Provider Care Teams.  These Care Teams include your primary Cardiologist (physician) and Advanced Practice Providers (APPs -  Physician Assistants and Nurse Practitioners) who all work together to provide you with the care you need, when you need it.  Your next appointment:   2 week(s) after your cath  The format for your next appointment:   In Person  Provider:    Cardiologist (will let you know when we call to schedule)    Thank you for choosing [facility]!!   Trinidad Curet, RN [phone]   Other Instructions  COVID TEST-- On 01/30/2021 @ 2:30 pm - You will go to [place] for your Covid testing.   This is a drive thru test site, stay in your car and the nurse team will come to your car to test you.  After you are tested please go home and self quarantine until the day of your procedure.      [place] OFFICE [place], [place] [address] Dept: [phone] Loc: [phone]  MAJESTI GAMBRELL  01/30/2021  You are scheduled for a Cardiac Catheterization on Thursday, April 14 with Dr. Glenetta Hew.  1. Please arrive at the [facility] (Main Entrance A) at [facility]: [address] [place],  [address] at 8:30 AM (This time is two hours before your procedure to ensure your preparation). Free valet parking service is available.   Special note: Every effort is made to have your procedure done on time. Please understand that emergencies sometimes delay scheduled procedures.  2. Diet: Do not eat solid foods after midnight.  The patient may have clear liquids until 5am upon the day of the procedure.  3. Labs: You will need to have blood drawn on Tuesday, April 12 at [facility] at [facility]. [address]  Open: 7:30am - 5pm    Phone: [phone]. You do  not need to be fasting.  4. Medication instructions in preparation for your procedure:   Contrast Allergy: No  Stop taking Eliquis (Apixiban) on Tuesday, April 12.  Stop taking, Lasix (Furosemide)  Thursday, April 14,  Do not take any diabetic medications the morning of this cath   On the morning of your procedure, take your Aspirin and any morning medicines NOT listed above.  You may use sips of water.  5.  Plan for one night stay--bring personal belongings. 6. Bring a current list of your medications and current insurance cards. 7. You MUST have a responsible person to drive you home. 8. Someone MUST be with you the first 24 hours after you arrive home or your discharge will be delayed. 9. Please wear clothes that are easy to get on and off and wear slip-on shoes.  Thank you for allowing [place] to care for you!   -- [facility]

## 2021-01-30 NOTE — Progress Notes (Signed)
Electrophysiology Office Note   Date:  01/30/2021   ID:  [name], DOB 31-Oct-1955, MRN [id number]  PCP:  Glendale Chard, MD  Cardiologist:  [name] Primary Electrophysiologist:  Amiley Shishido Meredith Leeds, MD    Chief Complaint: AF   History of Present Illness: [name] is a 65 y.o. female who is being seen today for the evaluation of AF at the request of Glendale Chard, MD. Presenting today for electrophysiology evaluation.  She has a history significant for coronary artery disease status post circumflex and LAD stents, chronic pericardial effusion with chronic pericarditis and friction rub, CHF, diabetes, hypertension, non-small cell lung cancer status post surgery and chemotherapy, COPD, pulmonary fibrosis, and atrial fibrillation.  She was admitted to the hospital April 2020 and has since been loaded on dofetilide.  Today, denies symptoms of palpitations, orthopnea, PND, lower extremity edema, claudication, dizziness, presyncope, syncope, bleeding, or neurologic sequela. The patient is tolerating medications without difficulties.  Over the past few weeks, she has been having worsening chest pain.  Pain is at times worse when she exerts herself.  She also had chest discomfort when she was singing in church.  This is been intermittent over this time period.  She has not taken any nitroglycerin.  When she does not have chest pain, she feels well and has no complaints.   Past Medical History:  Diagnosis Date  . Allergy   . Anemia   . Anemia   . Aortic insufficiency    a. Mod by echo 05/2015.  . Arthritis    "knees" (07/11/2015)  . Atrial fibrillation (HCC)    On Eliquis  . Carcinoma of hilus of lung ([place]) 01/20/2012   IIIB NSCL  Right hilar mass compressing esophagus/presenting with dysphagia Rx Surgery/RT/chemo dx January 2001  . Complication of anesthesia    "I had a hard time waking me up w/one of my shoulder surgeries"  . Coronary artery disease    a. Abnormal stress test ->  LHc 06/2015 s/p DES to mCX and mLAD, residual D2 disease treated medially.  . Diverticulitis   . Elevated blood pressure   . Erythema nodosum   . Heart murmur   . Insomnia   . lung ca dx'd 10/1999   chemo/xrt comp 05/29/2000  . Myalgia   . Nephrolithiasis 2015  . Obesity   . Pericardial effusion    a. 05/2015 Echo: EF 55-60%, no rwma, Gr 1 DD, no effusion - but an effusion was seen on CT which was felt to be increased in size compared to 12/2014 - pericardium also thickened.   . Pericarditis    a. Dx 05/2015.  [name] Pneumothorax, spontaneous, tension 01/20/2012   October, 2010  . Pulmonary fibrosis ([place]) 01/20/2012   Due to previous surgery and chest radiation for lung cancer  . Restrictive lung disease   . Type II diabetes mellitus ([place])    Past Surgical History:  Procedure Laterality Date  . ABDOMINAL HYSTERECTOMY  2008  . APPENDECTOMY  1969?  [name] CARDIAC CATHETERIZATION N/A 07/11/2015   Procedure: Left Heart Cath and Coronary Angiography;  Surgeon: Jettie Booze, MD;  Location: [facility];  Service: Cardiovascular;  Laterality: N/A;  . CARDIAC CATHETERIZATION  07/11/2015   Procedure: Coronary Stent Intervention;  Surgeon: Jettie Booze, MD;  Location: [facility];  Service: Cardiovascular;;  . CARPAL TUNNEL RELEASE Bilateral 1998-2005?   right-left  . CARPAL TUNNEL RELEASE Left 2009?   "for the 2nd time"  .  COLONOSCOPY    . COLONOSCOPY WITH PROPOFOL N/A 05/28/2018   Procedure: COLONOSCOPY WITH PROPOFOL;  Surgeon: Milus Banister, MD;  Location: [facility];  Service: Endoscopy;  Laterality: N/A;  . CORONARY ANGIOPLASTY    . CYSTOSCOPY W/ STONE MANIPULATION  2013  . ESOPHAGOGASTRODUODENOSCOPY (EGD) WITH ESOPHAGEAL DILATION  2012  . FOOT SURGERY Bilateral 1980's   Callous removed   . INNER EAR SURGERY Right ~ 2009  . KNEE ARTHROPLASTY Right 1991  . KNEE ARTHROSCOPY  ~ 2000   LATERAL RELEASE  . LEFT HEART CATH AND CORONARY ANGIOGRAPHY N/A 12/18/2016   Procedure:  Left Heart Cath and Coronary Angiography;  Surgeon: Burnell Blanks, MD;  Location: [facility];  Service: Cardiovascular;  Laterality: N/A;  . LUNG CANCER SURGERY Left 2001   "small cell"  . LUNG SURGERY Right 2007   "reinflated it"  . PULMONARY EMBOLISM SURGERY  2007   LUNG COLLAPSE  . RIGHT AND LEFT HEART CATH N/A 06/25/2017   Procedure: RIGHT AND LEFT HEART CATH;  Surgeon: Sherren Mocha, MD;  Location: [facility];  Service: Cardiovascular;  Laterality: N/A;  . SHOULDER ADHESION RELEASE Right ~ 2004  . SHOULDER ARTHROSCOPY W/ ROTATOR CUFF REPAIR Right ~ 2003     Current Outpatient Medications  Medication Sig Dispense Refill  . Accu-Chek Softclix Lancets lancets USE LANCETS 3 TIMES A DAY AS DIRECTED 30 DAYS    . azaTHIOprine (IMURAN) 50 MG tablet Take by mouth. 2 per day    . B-D UF III MINI PEN NEEDLES 31G X 5 MM MISC 2 (two) times daily. as directed    . Blood Glucose Monitoring Suppl (ACCU-CHEK GUIDE) w/Device KIT See admin instructions.    [name] co-enzyme Q-10 50 MG capsule TAKE 1 CAPSULE BY MOUTH EVERY DAY 30 capsule 7  . diltiazem (CARDIZEM CD) 360 MG 24 hr capsule TAKE 1 CAPSULE BY MOUTH EVERY DAY 90 capsule 1  . dofetilide (TIKOSYN) 500 MCG capsule TAKE 1 CAPSULE (500 MCG TOTAL) BY MOUTH 2 (TWO) TIMES DAILY. 180 capsule 1  . ELIQUIS 5 MG TABS tablet TAKE 1 TABLET BY MOUTH TWICE A DAY 950 tablet 1  . folic acid (FOLVITE) 1 MG tablet Take 1 mg by mouth daily.    . furosemide (LASIX) 40 MG tablet Take 40 mg by mouth as needed.    [name] HUMALOG MIX 75/25 KWIKPEN (75-25) 100 UNIT/ML Kwikpen Inject 30-40 Units into the skin See admin instructions. Inject 40 units SQ in Am and 30 units SQ in PM  6  . levothyroxine (SYNTHROID, LEVOTHROID) 50 MCG tablet Take 50 mcg by mouth daily before breakfast.   6  . losartan (COZAAR) 25 MG tablet TAKE 1 TABLET BY MOUTH EVERY DAY 90 tablet 1  . metoprolol tartrate (LOPRESSOR) 25 MG tablet TAKE 1 TABLET BY MOUTH TWICE A DAY 180 tablet 3   . ONE TOUCH ULTRA TEST test strip USE AS DIRECTED 3 TIMES A DAY    . OZEMPIC, 0.25 OR 0.5 MG/DOSE, 2 MG/1.5ML SOPN INJECT 0.5 MG INTO THE SKIN ONCE A WEEK. 3 pen 3  . pravastatin (PRAVACHOL) 20 MG tablet TAKE 1 TABLET BY MOUTH EVERY DAY 90 tablet 2  . spironolactone (ALDACTONE) 25 MG tablet TAKE 1 TABLET BY MOUTH EVERY DAY 90 tablet 2  . nitroGLYCERIN (NITROSTAT) 0.4 MG SL tablet Place 1 tablet (0.4 mg total) under the tongue every 5 (five) minutes as needed for chest pain. 25 tablet 3   No current facility-administered  medications for this visit.    Allergies:   Hydrocodone, Hydrocodone-acetaminophen, Morphine and related, Rosuvastatin, Lisinopril, and Nsaids   Social History:  The patient  reports that she quit smoking about 21 years ago. Her smoking use included cigarettes. She has a 1.25 pack-year smoking history. She has never used smokeless tobacco. She reports that she does not drink alcohol and does not use drugs.   Family History:  The patient's family history includes Diabetes in her father, maternal grandmother, mother, paternal grandmother, sister, sister, sister, and sister; Heart disease in her father; Hypertension in her sister; Kidney disease in her sister; Pancreatic cancer in her mother.   ROS:  Please see the history of present illness.   Otherwise, review of systems is positive for none.   All other systems are reviewed and negative.   PHYSICAL EXAM: VS:  BP 124/72   Pulse 68   Ht 5' 7.5" (1.715 m)   Wt 242 lb (109.8 kg)   SpO2 98%   BMI 37.34 kg/m  , BMI Body mass index is 37.34 kg/m. GEN: Well nourished, well developed, in no acute distress  HEENT: normal  Neck: no JVD, carotid bruits, or masses Cardiac: RRR; no murmurs, rubs, or gallops,no edema  Respiratory:  clear to auscultation bilaterally, normal work of breathing GI: soft, nontender, nondistended, + BS MS: no deformity or atrophy  Skin: warm and dry Neuro:  Strength and sensation are intact Psych:  euthymic mood, full affect  EKG:  EKG is ordered today. Personal review of the ekg ordered shows sinus rhythm, rate 68, QTc 489 ms  Recent Labs: 09/04/2020: ALT 6; Hemoglobin 11.7; Platelets 359 09/05/2020: BUN 20; Creatinine 1.2; Potassium 5.1; Sodium 146 09/08/2020: TSH 2.62    Lipid Panel     Component Value Date/Time   CHOL 142 09/08/2020 0000   CHOL 163 09/04/2020 1520   TRIG 75 09/08/2020 0000   HDL 54 09/08/2020 0000   HDL 47 09/04/2020 1520   CHOLHDL 3.5 09/04/2020 1520   CHOLHDL 3.1 02/17/2019 0215   VLDL 17 02/17/2019 0215   LDLCALC 91 09/04/2020 1520     Wt Readings from Last 3 Encounters:  01/30/21 242 lb (109.8 kg)  12/15/20 240 lb 12.8 oz (109.2 kg)  09/04/20 239 lb (108.4 kg)      Other studies Reviewed: Additional studies/ records that were reviewed today include: TTE 02/17/19  Review of the above records today demonstrates:   1. The left ventricle has hyperdynamic systolic function, with an ejection fraction of >65%. The cavity size was normal. There is moderately increased left ventricular wall thickness. Left ventricular diastolic Doppler parameters are consistent with  pseudonormalization. Elevated left atrial and left ventricular end-diastolic pressures The E/e' is >15. No evidence of left ventricular regional wall motion abnormalities.  2. The right ventricle has normal systolic function. The cavity was normal. There is no increase in right ventricular wall thickness.  3. The mitral valve is grossly normal.  4. The tricuspid valve is grossly normal.  5. The aortic valve is tricuspid. Mild sclerosis of the aortic valve. Aortic valve regurgitation is mild to moderate by color flow Doppler. mildly increased gradient without stenosis of the aortic valve.  6. The aortic root and ascending aorta are normal in size and structure.  7. The inferior vena cava was dilated in size with <50% respiratory variability.   ASSESSMENT AND PLAN:  1.  Paroxysmal atrial  fibrillation: Currently on dofetilide and Eliquis.  CHA2DS2-VASc of 4.  Most recent  cardiac monitor showed a less than 1% atrial fibrillation burden.  High risk medication monitoring.  She has remained in sinus rhythm.  2.  Recurrent pericarditis: Followed in cardiology clinic  3.  Chronic diastolic heart failure: No obvious volume overload.  4.  Coronary artery disease: Has significant coronary artery disease status post stenting, most recent cath in 2018.  She does come in complaining of stuttering chest pain.  It is unclear whether or not this is pericarditis or due to her coronary artery disease.  Of note, her ECG is not convincing for pericarditis.  Due to that, we Achilles Neville plan for left heart catheterization.  The patient understands that risks include but are not limited to stroke (1 in 1000), death (1 in 50), kidney failure [usually temporary] (1 in 500), bleeding (1 in 200), allergic reaction [possibly serious] (1 in 200), and agrees to proceed.   Current medicines are reviewed at length with the patient today.   The patient does not have concerns regarding her medicines.  The following changes were made today: none  Labs/ tests ordered today include:  Orders Placed This Encounter  Procedures  . Basic metabolic panel  . CBC  . EKG 12-Lead     Disposition:   FU with D'Arcy Abraha 6 months  Signed, Shavonn Convey Meredith Leeds, MD  01/30/2021 10:33 AM     [facility] [address] [place] [address] [phone] (office) [phone] (fax)

## 2021-01-30 NOTE — H&P (View-Only) (Signed)
Electrophysiology Office Note   Date:  01/30/2021   ID:  [name], DOB 31-Oct-1955, MRN [id number]  PCP:  Glendale Chard, MD  Cardiologist:  [name] Primary Electrophysiologist:  Kaylise Blakeley Meredith Leeds, MD    Chief Complaint: AF   History of Present Illness: [name] is a 65 y.o. female who is being seen today for the evaluation of AF at the request of Glendale Chard, MD. Presenting today for electrophysiology evaluation.  She has a history significant for coronary artery disease status post circumflex and LAD stents, chronic pericardial effusion with chronic pericarditis and friction rub, CHF, diabetes, hypertension, non-small cell lung cancer status post surgery and chemotherapy, COPD, pulmonary fibrosis, and atrial fibrillation.  She was admitted to the hospital April 2020 and has since been loaded on dofetilide.  Today, denies symptoms of palpitations, orthopnea, PND, lower extremity edema, claudication, dizziness, presyncope, syncope, bleeding, or neurologic sequela. The patient is tolerating medications without difficulties.  Over the past few weeks, she has been having worsening chest pain.  Pain is at times worse when she exerts herself.  She also had chest discomfort when she was singing in church.  This is been intermittent over this time period.  She has not taken any nitroglycerin.  When she does not have chest pain, she feels well and has no complaints.   Past Medical History:  Diagnosis Date  . Allergy   . Anemia   . Anemia   . Aortic insufficiency    a. Mod by echo 05/2015.  . Arthritis    "knees" (07/11/2015)  . Atrial fibrillation (HCC)    On Eliquis  . Carcinoma of hilus of lung ([place]) 01/20/2012   IIIB NSCL  Right hilar mass compressing esophagus/presenting with dysphagia Rx Surgery/RT/chemo dx January 2001  . Complication of anesthesia    "I had a hard time waking me up w/one of my shoulder surgeries"  . Coronary artery disease    a. Abnormal stress test ->  LHc 06/2015 s/p DES to mCX and mLAD, residual D2 disease treated medially.  . Diverticulitis   . Elevated blood pressure   . Erythema nodosum   . Heart murmur   . Insomnia   . lung ca dx'd 10/1999   chemo/xrt comp 05/29/2000  . Myalgia   . Nephrolithiasis 2015  . Obesity   . Pericardial effusion    a. 05/2015 Echo: EF 55-60%, no rwma, Gr 1 DD, no effusion - but an effusion was seen on CT which was felt to be increased in size compared to 12/2014 - pericardium also thickened.   . Pericarditis    a. Dx 05/2015.  [name] Pneumothorax, spontaneous, tension 01/20/2012   October, 2010  . Pulmonary fibrosis ([place]) 01/20/2012   Due to previous surgery and chest radiation for lung cancer  . Restrictive lung disease   . Type II diabetes mellitus ([place])    Past Surgical History:  Procedure Laterality Date  . ABDOMINAL HYSTERECTOMY  2008  . APPENDECTOMY  1969?  [name] CARDIAC CATHETERIZATION N/A 07/11/2015   Procedure: Left Heart Cath and Coronary Angiography;  Surgeon: Jettie Booze, MD;  Location: [facility];  Service: Cardiovascular;  Laterality: N/A;  . CARDIAC CATHETERIZATION  07/11/2015   Procedure: Coronary Stent Intervention;  Surgeon: Jettie Booze, MD;  Location: [facility];  Service: Cardiovascular;;  . CARPAL TUNNEL RELEASE Bilateral 1998-2005?   right-left  . CARPAL TUNNEL RELEASE Left 2009?   "for the 2nd time"  .  COLONOSCOPY    . COLONOSCOPY WITH PROPOFOL N/A 05/28/2018   Procedure: COLONOSCOPY WITH PROPOFOL;  Surgeon: Milus Banister, MD;  Location: [facility];  Service: Endoscopy;  Laterality: N/A;  . CORONARY ANGIOPLASTY    . CYSTOSCOPY W/ STONE MANIPULATION  2013  . ESOPHAGOGASTRODUODENOSCOPY (EGD) WITH ESOPHAGEAL DILATION  2012  . FOOT SURGERY Bilateral 1980's   Callous removed   . INNER EAR SURGERY Right ~ 2009  . KNEE ARTHROPLASTY Right 1991  . KNEE ARTHROSCOPY  ~ 2000   LATERAL RELEASE  . LEFT HEART CATH AND CORONARY ANGIOGRAPHY N/A 12/18/2016   Procedure:  Left Heart Cath and Coronary Angiography;  Surgeon: Burnell Blanks, MD;  Location: [facility];  Service: Cardiovascular;  Laterality: N/A;  . LUNG CANCER SURGERY Left 2001   "small cell"  . LUNG SURGERY Right 2007   "reinflated it"  . PULMONARY EMBOLISM SURGERY  2007   LUNG COLLAPSE  . RIGHT AND LEFT HEART CATH N/A 06/25/2017   Procedure: RIGHT AND LEFT HEART CATH;  Surgeon: Sherren Mocha, MD;  Location: [facility];  Service: Cardiovascular;  Laterality: N/A;  . SHOULDER ADHESION RELEASE Right ~ 2004  . SHOULDER ARTHROSCOPY W/ ROTATOR CUFF REPAIR Right ~ 2003     Current Outpatient Medications  Medication Sig Dispense Refill  . Accu-Chek Softclix Lancets lancets USE LANCETS 3 TIMES A DAY AS DIRECTED 30 DAYS    . azaTHIOprine (IMURAN) 50 MG tablet Take by mouth. 2 per day    . B-D UF III MINI PEN NEEDLES 31G X 5 MM MISC 2 (two) times daily. as directed    . Blood Glucose Monitoring Suppl (ACCU-CHEK GUIDE) w/Device KIT See admin instructions.    [name] co-enzyme Q-10 50 MG capsule TAKE 1 CAPSULE BY MOUTH EVERY DAY 30 capsule 7  . diltiazem (CARDIZEM CD) 360 MG 24 hr capsule TAKE 1 CAPSULE BY MOUTH EVERY DAY 90 capsule 1  . dofetilide (TIKOSYN) 500 MCG capsule TAKE 1 CAPSULE (500 MCG TOTAL) BY MOUTH 2 (TWO) TIMES DAILY. 180 capsule 1  . ELIQUIS 5 MG TABS tablet TAKE 1 TABLET BY MOUTH TWICE A DAY 950 tablet 1  . folic acid (FOLVITE) 1 MG tablet Take 1 mg by mouth daily.    . furosemide (LASIX) 40 MG tablet Take 40 mg by mouth as needed.    [name] HUMALOG MIX 75/25 KWIKPEN (75-25) 100 UNIT/ML Kwikpen Inject 30-40 Units into the skin See admin instructions. Inject 40 units SQ in Am and 30 units SQ in PM  6  . levothyroxine (SYNTHROID, LEVOTHROID) 50 MCG tablet Take 50 mcg by mouth daily before breakfast.   6  . losartan (COZAAR) 25 MG tablet TAKE 1 TABLET BY MOUTH EVERY DAY 90 tablet 1  . metoprolol tartrate (LOPRESSOR) 25 MG tablet TAKE 1 TABLET BY MOUTH TWICE A DAY 180 tablet 3   . ONE TOUCH ULTRA TEST test strip USE AS DIRECTED 3 TIMES A DAY    . OZEMPIC, 0.25 OR 0.5 MG/DOSE, 2 MG/1.5ML SOPN INJECT 0.5 MG INTO THE SKIN ONCE A WEEK. 3 pen 3  . pravastatin (PRAVACHOL) 20 MG tablet TAKE 1 TABLET BY MOUTH EVERY DAY 90 tablet 2  . spironolactone (ALDACTONE) 25 MG tablet TAKE 1 TABLET BY MOUTH EVERY DAY 90 tablet 2  . nitroGLYCERIN (NITROSTAT) 0.4 MG SL tablet Place 1 tablet (0.4 mg total) under the tongue every 5 (five) minutes as needed for chest pain. 25 tablet 3   No current facility-administered  medications for this visit.    Allergies:   Hydrocodone, Hydrocodone-acetaminophen, Morphine and related, Rosuvastatin, Lisinopril, and Nsaids   Social History:  The patient  reports that she quit smoking about 21 years ago. Her smoking use included cigarettes. She has a 1.25 pack-year smoking history. She has never used smokeless tobacco. She reports that she does not drink alcohol and does not use drugs.   Family History:  The patient's family history includes Diabetes in her father, maternal grandmother, mother, paternal grandmother, sister, sister, sister, and sister; Heart disease in her father; Hypertension in her sister; Kidney disease in her sister; Pancreatic cancer in her mother.   ROS:  Please see the history of present illness.   Otherwise, review of systems is positive for none.   All other systems are reviewed and negative.   PHYSICAL EXAM: VS:  BP 124/72   Pulse 68   Ht 5' 7.5" (1.715 m)   Wt 242 lb (109.8 kg)   SpO2 98%   BMI 37.34 kg/m  , BMI Body mass index is 37.34 kg/m. GEN: Well nourished, well developed, in no acute distress  HEENT: normal  Neck: no JVD, carotid bruits, or masses Cardiac: RRR; no murmurs, rubs, or gallops,no edema  Respiratory:  clear to auscultation bilaterally, normal work of breathing GI: soft, nontender, nondistended, + BS MS: no deformity or atrophy  Skin: warm and dry Neuro:  Strength and sensation are intact Psych:  euthymic mood, full affect  EKG:  EKG is ordered today. Personal review of the ekg ordered shows sinus rhythm, rate 68, QTc 489 ms  Recent Labs: 09/04/2020: ALT 6; Hemoglobin 11.7; Platelets 359 09/05/2020: BUN 20; Creatinine 1.2; Potassium 5.1; Sodium 146 09/08/2020: TSH 2.62    Lipid Panel     Component Value Date/Time   CHOL 142 09/08/2020 0000   CHOL 163 09/04/2020 1520   TRIG 75 09/08/2020 0000   HDL 54 09/08/2020 0000   HDL 47 09/04/2020 1520   CHOLHDL 3.5 09/04/2020 1520   CHOLHDL 3.1 02/17/2019 0215   VLDL 17 02/17/2019 0215   LDLCALC 91 09/04/2020 1520     Wt Readings from Last 3 Encounters:  01/30/21 242 lb (109.8 kg)  12/15/20 240 lb 12.8 oz (109.2 kg)  09/04/20 239 lb (108.4 kg)      Other studies Reviewed: Additional studies/ records that were reviewed today include: TTE 02/17/19  Review of the above records today demonstrates:   1. The left ventricle has hyperdynamic systolic function, with an ejection fraction of >65%. The cavity size was normal. There is moderately increased left ventricular wall thickness. Left ventricular diastolic Doppler parameters are consistent with  pseudonormalization. Elevated left atrial and left ventricular end-diastolic pressures The E/e' is >15. No evidence of left ventricular regional wall motion abnormalities.  2. The right ventricle has normal systolic function. The cavity was normal. There is no increase in right ventricular wall thickness.  3. The mitral valve is grossly normal.  4. The tricuspid valve is grossly normal.  5. The aortic valve is tricuspid. Mild sclerosis of the aortic valve. Aortic valve regurgitation is mild to moderate by color flow Doppler. mildly increased gradient without stenosis of the aortic valve.  6. The aortic root and ascending aorta are normal in size and structure.  7. The inferior vena cava was dilated in size with <50% respiratory variability.   ASSESSMENT AND PLAN:  1.  Paroxysmal atrial  fibrillation: Currently on dofetilide and Eliquis.  CHA2DS2-VASc of 4.  Most recent  cardiac monitor showed a less than 1% atrial fibrillation burden.  High risk medication monitoring.  She has remained in sinus rhythm.  2.  Recurrent pericarditis: Followed in cardiology clinic  3.  Chronic diastolic heart failure: No obvious volume overload.  4.  Coronary artery disease: Has significant coronary artery disease status post stenting, most recent cath in 2018.  She does come in complaining of stuttering chest pain.  It is unclear whether or not this is pericarditis or due to her coronary artery disease.  Of note, her ECG is not convincing for pericarditis.  Due to that, we Akhilesh Sassone plan for left heart catheterization.  The patient understands that risks include but are not limited to stroke (1 in 1000), death (1 in 50), kidney failure [usually temporary] (1 in 500), bleeding (1 in 200), allergic reaction [possibly serious] (1 in 200), and agrees to proceed.   Current medicines are reviewed at length with the patient today.   The patient does not have concerns regarding her medicines.  The following changes were made today: none  Labs/ tests ordered today include:  Orders Placed This Encounter  Procedures  . Basic metabolic panel  . CBC  . EKG 12-Lead     Disposition:   FU with Algenis Ballin 6 months  Signed, Kennady Zimmerle Meredith Leeds, MD  01/30/2021 10:33 AM     [facility] [address] [place] [address] [phone] (office) [phone] (fax)

## 2021-01-31 ENCOUNTER — Telehealth: Payer: Self-pay | Admitting: *Deleted

## 2021-01-31 NOTE — Telephone Encounter (Signed)
Pt contacted pre-catheterization scheduled at [facility] for: Thursday February 01, 2021 10:30 AM Verified arrival time and place: [facility]) at: 8:30 AM   No solid food after midnight prior to cath, clear liquids until 5 AM day of procedure.  Hold: Eliquis-none PM 01/30/21 until post procedure Insulin-AM of procedure/1/2 usual Insulin HS prior to procedure Lasix/Spironolactone-AM of procedure   Except hold medications AM meds can be  taken pre-cath with sips of water including: ASA 81 mg   Confirmed patient has responsible adult to drive home post procedure and be with patient first 24 hours after arriving home: yes  You are allowed ONE visitor in the waiting room during the time you are at the hospital for your procedure. Both you and your visitor must wear a mask once you enter the hospital.   Reviewed procedure/mask/visitor instructions with patient.

## 2021-02-01 ENCOUNTER — Encounter (HOSPITAL_COMMUNITY)
Admission: RE | Disposition: A | Discharge status: Home / Self Care | Payer: Self-pay | Source: Home / Self Care | Attending: Cardiology

## 2021-02-01 ENCOUNTER — Ambulatory Visit (HOSPITAL_COMMUNITY)
Admission: RE | Admit: 2021-02-01 | Discharge: 2021-02-01 | Disposition: A | Discharge status: Home / Self Care | Payer: Medicare PPO | Attending: Cardiology | Admitting: Cardiology

## 2021-02-01 DIAGNOSIS — Z79890 Hormone replacement therapy (postmenopausal): Secondary | ICD-10-CM | POA: Diagnosis not present

## 2021-02-01 DIAGNOSIS — Z886 Allergy status to analgesic agent status: Secondary | ICD-10-CM | POA: Insufficient documentation

## 2021-02-01 DIAGNOSIS — I25119 Atherosclerotic heart disease of native coronary artery with unspecified angina pectoris: Secondary | ICD-10-CM | POA: Diagnosis not present

## 2021-02-01 DIAGNOSIS — Z955 Presence of coronary angioplasty implant and graft: Secondary | ICD-10-CM | POA: Diagnosis not present

## 2021-02-01 DIAGNOSIS — I480 Paroxysmal atrial fibrillation: Secondary | ICD-10-CM | POA: Diagnosis not present

## 2021-02-01 DIAGNOSIS — Z7901 Long term (current) use of anticoagulants: Secondary | ICD-10-CM | POA: Insufficient documentation

## 2021-02-01 DIAGNOSIS — I25118 Atherosclerotic heart disease of native coronary artery with other forms of angina pectoris: Secondary | ICD-10-CM | POA: Diagnosis not present

## 2021-02-01 DIAGNOSIS — E119 Type 2 diabetes mellitus without complications: Secondary | ICD-10-CM | POA: Insufficient documentation

## 2021-02-01 DIAGNOSIS — I5032 Chronic diastolic (congestive) heart failure: Secondary | ICD-10-CM | POA: Insufficient documentation

## 2021-02-01 DIAGNOSIS — I208 Other forms of angina pectoris: Secondary | ICD-10-CM | POA: Diagnosis present

## 2021-02-01 DIAGNOSIS — Z885 Allergy status to narcotic agent status: Secondary | ICD-10-CM | POA: Insufficient documentation

## 2021-02-01 DIAGNOSIS — Z87891 Personal history of nicotine dependence: Secondary | ICD-10-CM | POA: Insufficient documentation

## 2021-02-01 DIAGNOSIS — I319 Disease of pericardium, unspecified: Secondary | ICD-10-CM | POA: Diagnosis present

## 2021-02-01 DIAGNOSIS — Z888 Allergy status to other drugs, medicaments and biological substances status: Secondary | ICD-10-CM | POA: Diagnosis not present

## 2021-02-01 DIAGNOSIS — Z79899 Other long term (current) drug therapy: Secondary | ICD-10-CM | POA: Insufficient documentation

## 2021-02-01 DIAGNOSIS — Z794 Long term (current) use of insulin: Secondary | ICD-10-CM | POA: Insufficient documentation

## 2021-02-01 DIAGNOSIS — I110 Hypertensive heart disease with heart failure: Secondary | ICD-10-CM | POA: Diagnosis not present

## 2021-02-01 DIAGNOSIS — I2510 Atherosclerotic heart disease of native coronary artery without angina pectoris: Secondary | ICD-10-CM

## 2021-02-01 HISTORY — PX: LEFT HEART CATH AND CORONARY ANGIOGRAPHY: CATH118249

## 2021-02-01 LAB — GLUCOSE, CAPILLARY
Glucose-Capillary: 113 mg/dL — ABNORMAL HIGH (ref 70–99)
Glucose-Capillary: 76 mg/dL (ref 70–99)

## 2021-02-01 SURGERY — LEFT HEART CATH AND CORONARY ANGIOGRAPHY
Anesthesia: LOCAL

## 2021-02-01 MED ORDER — SODIUM CHLORIDE 0.9 % IV SOLN: 250.0000 mL | INTRAVENOUS | Status: DC | PRN

## 2021-02-01 MED ORDER — HEPARIN SODIUM (PORCINE) 1000 UNIT/ML IJ SOLN
INTRAMUSCULAR | Status: AC
  Filled 2021-02-01: qty 1

## 2021-02-01 MED ORDER — SODIUM CHLORIDE 0.9 % WEIGHT BASED INFUSION
3.0000 mL/kg/h | INTRAVENOUS | Status: AC
  Administered 2021-02-01: 3 mL/kg/h via INTRAVENOUS

## 2021-02-01 MED ORDER — VERAPAMIL HCL 2.5 MG/ML IV SOLN
INTRAVENOUS | Status: AC
  Filled 2021-02-01: qty 2

## 2021-02-01 MED ORDER — FENTANYL CITRATE (PF) 100 MCG/2ML IJ SOLN
INTRAMUSCULAR | Status: DC | PRN
  Administered 2021-02-01: 25 ug via INTRAVENOUS

## 2021-02-01 MED ORDER — HEPARIN (PORCINE) IN NACL 1000-0.9 UT/500ML-% IV SOLN
INTRAVENOUS | Status: AC
  Filled 2021-02-01: qty 500

## 2021-02-01 MED ORDER — VERAPAMIL HCL 2.5 MG/ML IV SOLN
INTRAVENOUS | Status: DC | PRN
  Administered 2021-02-01: 10 mL via INTRA_ARTERIAL

## 2021-02-01 MED ORDER — ASPIRIN 81 MG PO CHEW: 81.0000 mg | CHEWABLE_TABLET | ORAL | Status: DC

## 2021-02-01 MED ORDER — IOHEXOL 350 MG/ML SOLN
INTRAVENOUS | Status: DC | PRN
  Administered 2021-02-01: 70 mL

## 2021-02-01 MED ORDER — SODIUM CHLORIDE 0.9 % WEIGHT BASED INFUSION: 1.0000 mL/kg/h | INTRAVENOUS | Status: DC

## 2021-02-01 MED ORDER — MIDAZOLAM HCL 2 MG/2ML IJ SOLN
INTRAMUSCULAR | Status: AC
  Filled 2021-02-01: qty 2

## 2021-02-01 MED ORDER — HEPARIN (PORCINE) IN NACL 1000-0.9 UT/500ML-% IV SOLN
INTRAVENOUS | Status: DC | PRN
  Administered 2021-02-01 (×2): 500 mL

## 2021-02-01 MED ORDER — HEPARIN SODIUM (PORCINE) 1000 UNIT/ML IJ SOLN
INTRAMUSCULAR | Status: DC | PRN
  Administered 2021-02-01: 5000 [IU] via INTRAVENOUS

## 2021-02-01 MED ORDER — SODIUM CHLORIDE 0.9% FLUSH: 3.0000 mL | INTRAVENOUS | Status: DC | PRN

## 2021-02-01 MED ORDER — MIDAZOLAM HCL 2 MG/2ML IJ SOLN
INTRAMUSCULAR | Status: DC | PRN
  Administered 2021-02-01: 2 mg via INTRAVENOUS

## 2021-02-01 MED ORDER — FENTANYL CITRATE (PF) 100 MCG/2ML IJ SOLN
INTRAMUSCULAR | Status: AC
  Filled 2021-02-01: qty 2

## 2021-02-01 MED ORDER — SODIUM CHLORIDE 0.9% FLUSH: 3.0000 mL | Freq: Two times a day (BID) | INTRAVENOUS | Status: DC

## 2021-02-01 MED ORDER — LIDOCAINE HCL (PF) 1 % IJ SOLN
INTRAMUSCULAR | Status: AC
  Filled 2021-02-01: qty 30

## 2021-02-01 MED ORDER — LIDOCAINE HCL (PF) 1 % IJ SOLN
INTRAMUSCULAR | Status: DC | PRN
  Administered 2021-02-01: 5 mL

## 2021-02-01 SURGICAL SUPPLY — 9 items
CATH OPTITORQUE TIG 4.0 5F (CATHETERS) ×1 IMPLANT
DEVICE RAD COMP TR BAND LRG (VASCULAR PRODUCTS) ×2 IMPLANT
GLIDESHEATH SLEND SS 6F .021 (SHEATH) ×1 IMPLANT
GUIDEWIRE INQWIRE 1.5J.035X260 (WIRE) IMPLANT
INQWIRE 1.5J .035X260CM (WIRE) ×2
KIT HEART LEFT (KITS) ×2 IMPLANT
PACK CARDIAC CATHETERIZATION (CUSTOM PROCEDURE TRAY) ×2 IMPLANT
TRANSDUCER W/STOPCOCK (MISCELLANEOUS) ×2 IMPLANT
TUBING CIL FLEX 10 FLL-RA (TUBING) ×2 IMPLANT

## 2021-02-01 NOTE — Interval H&P Note (Signed)
History and Physical Interval Note:  02/01/2021 12:25 PM Cath Lab Visit (complete for each Cath Lab visit)  Clinical Evaluation Leading to the Procedure:   ACS: No.  Non-ACS:    Anginal Classification: CCS III  Anti-ischemic medical therapy: Minimal Therapy (1 class of medications)  Non-Invasive Test Results: No non-invasive testing performed  Prior CABG: No previous CABG   Glenetta Hew, MD

## 2021-02-01 NOTE — Interval H&P Note (Signed)
History and Physical Interval Note:  02/01/2021 12:25 PM  [name]  has presented today for surgery, with the diagnosis of chest pain - concerning for Atypical Angina.  The various methods of treatment have been discussed with the patient and family. After consideration of risks, benefits and other options for treatment, the patient has consented to  Procedure(s): LEFT HEART CATH AND CORONARY ANGIOGRAPHY (N/A)  PERCUTANEOUS CORONARY INTERVENTION   as a surgical intervention.  The patient's history has been reviewed, patient examined, no change in status, stable for surgery.  I have reviewed the patient's chart and labs.  Questions were answered to the patient's satisfaction.     Glenetta Hew

## 2021-02-01 NOTE — Discharge Instructions (Signed)
Radial Site Care  This sheet gives you information about how to care for yourself after your procedure. Your health care provider may also give you more specific instructions. If you have problems or questions, contact your health care provider. What can I expect after the procedure? After the procedure, it is common to have:  Bruising and tenderness at the catheter insertion area. Follow these instructions at home: Medicines  Take over-the-counter and prescription medicines only as told by your health care provider. Insertion site care  Follow instructions from your health care provider about how to take care of your insertion site. Make sure you: ? Wash your hands with soap and water before you change your bandage (dressing). If soap and water are not available, use hand sanitizer. ? Change your dressing as told by your health care provider. ? Leave stitches (sutures), skin glue, or adhesive strips in place. These skin closures may need to stay in place for 2 weeks or longer. If adhesive strip edges start to loosen and curl up, you may trim the loose edges. Do not remove adhesive strips completely unless your health care provider tells you to do that.  Check your insertion site every day for signs of infection. Check for: ? Redness, swelling, or pain. ? Fluid or blood. ? Pus or a bad smell. ? Warmth.  Do not take baths, swim, or use a hot tub until your health care provider approves.  You may shower 24-48 hours after the procedure, or as directed by your health care provider. ? Remove the dressing and gently wash the site with plain soap and water. ? Pat the area dry with a clean towel. ? Do not rub the site. That could cause bleeding.  Do not apply powder or lotion to the site. Activity  For 24 hours after the procedure, or as directed by your health care provider: ? Do not flex or bend the affected arm. ? Do not push or pull heavy objects with the affected arm. ? Do not drive  yourself home from the hospital or clinic. You may drive 24 hours after the procedure unless your health care provider tells you not to. ? Do not operate machinery or power tools.  Do not lift anything that is heavier than 10 lb (4.5 kg), or the limit that you are told, until your health care provider says that it is safe.  Ask your health care provider when it is okay to: ? Return to work or school. ? Resume usual physical activities or sports. ? Resume sexual activity.   General instructions  If the catheter site starts to bleed, raise your arm and put firm pressure on the site. If the bleeding does not stop, get help right away. This is a medical emergency.  If you went home on the same day as your procedure, a responsible adult should be with you for the first 24 hours after you arrive home.  Keep all follow-up visits as told by your health care provider. This is important. Contact a health care provider if:  You have a fever.  You have redness, swelling, or yellow drainage around your insertion site. Get help right away if:  You have unusual pain at the radial site.  The catheter insertion area swells very fast.  The insertion area is bleeding, and the bleeding does not stop when you hold steady pressure on the area.  Your arm or hand becomes pale, cool, tingly, or numb. These symptoms may represent a serious   problem that is an emergency. Do not wait to see if the symptoms will go away. Get medical help right away. Call your local emergency services (911 in the [place] Do not drive yourself to the hospital. Summary  After the procedure, it is common to have bruising and tenderness at the site.  Follow instructions from your health care provider about how to take care of your radial site wound. Check the wound every day for signs of infection.  Do not lift anything that is heavier than 10 lb (4.5 kg), or the limit that you are told, until your health care provider says that it  is safe. This information is not intended to replace advice given to you by your health care provider. Make sure you discuss any questions you have with your health care provider. Document Revised: 11/12/2017 Document Reviewed: 11/12/2017 Elsevier Patient Education  2021 [employer]  

## 2021-02-02 ENCOUNTER — Encounter (HOSPITAL_COMMUNITY): Payer: Self-pay | Admitting: Cardiology

## 2021-02-05 ENCOUNTER — Ambulatory Visit
Admission: RE | Admit: 2021-02-05 | Discharge: 2021-02-05 | Disposition: A | Discharge status: Home / Self Care | Payer: Medicare PPO | Source: Ambulatory Visit | Attending: Internal Medicine | Admitting: Internal Medicine

## 2021-02-05 ENCOUNTER — Other Ambulatory Visit: Payer: Self-pay

## 2021-02-05 DIAGNOSIS — Z1231 Encounter for screening mammogram for malignant neoplasm of breast: Secondary | ICD-10-CM

## 2021-02-14 DIAGNOSIS — Z20822 Contact with and (suspected) exposure to covid-19: Secondary | ICD-10-CM | POA: Diagnosis not present

## 2021-03-05 ENCOUNTER — Ambulatory Visit (INDEPENDENT_AMBULATORY_CARE_PROVIDER_SITE_OTHER): Payer: Medicare PPO | Admitting: Internal Medicine

## 2021-03-05 ENCOUNTER — Encounter: Payer: Self-pay | Admitting: Internal Medicine

## 2021-03-05 ENCOUNTER — Other Ambulatory Visit: Payer: Self-pay

## 2021-03-05 ENCOUNTER — Ambulatory Visit: Payer: Medicare PPO | Admitting: Interventional Cardiology

## 2021-03-05 VITALS — BP 116/74 | HR 77 | Temp 98.2°F | Ht 67.0 in | Wt 236.0 lb

## 2021-03-05 DIAGNOSIS — N183 Chronic kidney disease, stage 3 unspecified: Secondary | ICD-10-CM | POA: Diagnosis not present

## 2021-03-05 DIAGNOSIS — E039 Hypothyroidism, unspecified: Secondary | ICD-10-CM | POA: Diagnosis not present

## 2021-03-05 DIAGNOSIS — E1122 Type 2 diabetes mellitus with diabetic chronic kidney disease: Secondary | ICD-10-CM

## 2021-03-05 DIAGNOSIS — J449 Chronic obstructive pulmonary disease, unspecified: Secondary | ICD-10-CM

## 2021-03-05 DIAGNOSIS — Z6836 Body mass index (BMI) 36.0-36.9, adult: Secondary | ICD-10-CM | POA: Diagnosis not present

## 2021-03-05 DIAGNOSIS — I10 Essential (primary) hypertension: Secondary | ICD-10-CM | POA: Diagnosis not present

## 2021-03-05 DIAGNOSIS — I130 Hypertensive heart and chronic kidney disease with heart failure and stage 1 through stage 4 chronic kidney disease, or unspecified chronic kidney disease: Secondary | ICD-10-CM

## 2021-03-05 DIAGNOSIS — I4891 Unspecified atrial fibrillation: Secondary | ICD-10-CM | POA: Diagnosis not present

## 2021-03-05 DIAGNOSIS — R002 Palpitations: Secondary | ICD-10-CM

## 2021-03-05 DIAGNOSIS — I5032 Chronic diastolic (congestive) heart failure: Secondary | ICD-10-CM | POA: Diagnosis not present

## 2021-03-05 DIAGNOSIS — E1165 Type 2 diabetes mellitus with hyperglycemia: Secondary | ICD-10-CM | POA: Diagnosis not present

## 2021-03-05 DIAGNOSIS — Z794 Long term (current) use of insulin: Secondary | ICD-10-CM | POA: Diagnosis not present

## 2021-03-05 DIAGNOSIS — E7800 Pure hypercholesterolemia, unspecified: Secondary | ICD-10-CM | POA: Diagnosis not present

## 2021-03-05 DIAGNOSIS — I700 Atherosclerosis of aorta: Secondary | ICD-10-CM

## 2021-03-05 DIAGNOSIS — I2510 Atherosclerotic heart disease of native coronary artery without angina pectoris: Secondary | ICD-10-CM | POA: Diagnosis not present

## 2021-03-05 DIAGNOSIS — J8410 Pulmonary fibrosis, unspecified: Secondary | ICD-10-CM

## 2021-03-05 NOTE — Patient Instructions (Signed)

## 2021-03-05 NOTE — Progress Notes (Signed)
I,Katawbba Wiggins,acting as a Education administrator for [name], MD.,have documented all relevant documentation on the behalf of [name], MD,as directed by  [name], MD while in the presence of [name], MD.  This visit occurred during the SARS-CoV-2 public health emergency.  Safety protocols were in place, including screening questions prior to the visit, additional usage of staff PPE, and extensive cleaning of exam room while observing appropriate contact time as indicated for disinfecting solutions.  Subjective:     Patient ID: [name] , female    DOB: June 01, 1956 , 65 y.o.   MRN: [id number]   Chief Complaint  Patient presents with  . Hypertension    HPI  The patient is here today for a follow-up on HTN. She reports compliance with meds. She denies headaches, chest pain and visual disturbances. She does admit to having palpitations. She is not sure what is triggering her sx. States her sx occur at rest.   Hypertension This is a chronic problem. The current episode started more than 1 year ago. The problem has been gradually improving since onset. The problem is controlled. Pertinent negatives include no chest pain. Risk factors for coronary artery disease include diabetes mellitus, sedentary lifestyle, obesity and post-menopausal state. Compliance problems include exercise.   Diabetes She presents for her follow-up diabetic visit. She has type 2 diabetes mellitus. Her disease course has been improving. There are no hypoglycemic associated symptoms. Pertinent negatives for diabetes include no chest pain, no fatigue and no weakness. There are no hypoglycemic complications. Risk factors for coronary artery disease include diabetes mellitus, obesity and post-menopausal. She is following a diabetic diet. Meal planning includes avoidance of concentrated sweets. She participates in exercise intermittently. Eye exam is not current.     Past Medical History:  Diagnosis Date   . Allergy   . Anemia   . Anemia   . Aortic insufficiency    a. Mod by echo 05/2015.  . Arthritis    "knees" (07/11/2015)  . Atrial fibrillation (HCC)    On Eliquis  . 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 Complication of anesthesia    "I had a hard time waking me up w/one of my shoulder surgeries"  . Coronary artery disease    a. Abnormal stress test -> LHc 06/2015 s/p DES to mCX and mLAD, residual D2 disease treated medially.  . Diverticulitis   . Elevated blood pressure   . Erythema nodosum   . Heart murmur   . Insomnia   . lung ca dx'd 10/1999   chemo/xrt comp 05/29/2000  . Myalgia   . Nephrolithiasis 2015  . Obesity   . Pericardial effusion    a. 05/2015 Echo: EF 55-60%, no rwma, Gr 1 DD, no effusion - but an effusion was seen on CT which was felt to be increased in size compared to 12/2014 - pericardium also thickened.   . Pericarditis    a. Dx 05/2015.  [name] Pneumothorax, spontaneous, tension 01/20/2012   October, 2010  . Pulmonary fibrosis ([place]) 01/20/2012   Due to previous surgery and chest radiation for lung cancer  . Restrictive lung disease   . Type II diabetes mellitus (HCC)      Family History  Problem Relation Age of Onset  . Heart disease Father   . Diabetes Father   . Pancreatic cancer Mother   .  Diabetes Mother   . Hypertension Sister        x 3  . Diabetes Sister        x 4  . Diabetes Maternal Grandmother   . Diabetes Paternal Grandmother   . Diabetes Sister   . Diabetes Sister   . Diabetes Sister   . Kidney disease Sister   . Colon cancer Neg Hx   . Colon polyps Neg Hx   . Rectal cancer Neg Hx   . Stomach cancer Neg Hx   . Esophageal cancer Neg Hx   . Breast cancer Neg Hx      Current Outpatient Medications:  .  Accu-Chek Softclix Lancets lancets, USE LANCETS 3 TIMES A DAY AS DIRECTED 30 DAYS, Disp: , Rfl:  .  azaTHIOprine  (IMURAN) 50 MG tablet, Take 150 mg by mouth daily., Disp: , Rfl:  .  B-D UF III MINI PEN NEEDLES 31G X 5 MM MISC, 2 (two) times daily. as directed, Disp: , Rfl:  .  Blood Glucose Monitoring Suppl (ACCU-CHEK GUIDE) w/Device KIT, See admin instructions., Disp: , Rfl:  .  co-enzyme Q-10 50 MG capsule, TAKE 1 CAPSULE BY MOUTH EVERY DAY, Disp: 30 capsule, Rfl: 7 .  diltiazem (CARDIZEM CD) 360 MG 24 hr capsule, TAKE 1 CAPSULE BY MOUTH EVERY DAY, Disp: 90 capsule, Rfl: 1 .  dofetilide (TIKOSYN) 500 MCG capsule, TAKE 1 CAPSULE (500 MCG TOTAL) BY MOUTH 2 (TWO) TIMES DAILY., Disp: 180 capsule, Rfl: 1 .  ELIQUIS 5 MG TABS tablet, TAKE 1 TABLET BY MOUTH TWICE A DAY, Disp: 180 tablet, Rfl: 1 .  folic acid (FOLVITE) 1 MG tablet, Take 1 mg by mouth daily., Disp: , Rfl:  .  furosemide (LASIX) 40 MG tablet, Take 40 mg by mouth daily as needed for fluid or edema., Disp: , Rfl:  .  HUMALOG MIX 75/25 KWIKPEN (75-25) 100 UNIT/ML Kwikpen, Inject 30-40 Units into the skin See admin instructions. Inject 40 units SQ in Am and 35 units SQ in PM, Disp: , Rfl: 6 .  levothyroxine (SYNTHROID, LEVOTHROID) 50 MCG tablet, Take 50 mcg by mouth daily before breakfast. , Disp: , Rfl: 6 .  losartan (COZAAR) 25 MG tablet, TAKE 1 TABLET BY MOUTH EVERY DAY, Disp: 90 tablet, Rfl: 1 .  metoprolol tartrate (LOPRESSOR) 25 MG tablet, TAKE 1 TABLET BY MOUTH TWICE A DAY, Disp: 180 tablet, Rfl: 3 .  nitroGLYCERIN (NITROSTAT) 0.4 MG SL tablet, Place 1 tablet (0.4 mg total) under the tongue every 5 (five) minutes as needed for chest pain., Disp: 25 tablet, Rfl: 3 .  pravastatin (PRAVACHOL) 20 MG tablet, TAKE 1 TABLET BY MOUTH EVERY DAY, Disp: 90 tablet, Rfl: 2 .  spironolactone (ALDACTONE) 25 MG tablet, TAKE 1 TABLET BY MOUTH EVERY DAY, Disp: 90 tablet, Rfl: 2 .  OZEMPIC, 0.25 OR 0.5 MG/DOSE, 2 MG/1.5ML SOPN, INJECT 0.5 MG INTO THE SKIN ONCE A WEEK., Disp: 3 pen, Rfl: 3   Allergies  Allergen Reactions  . Hydrocodone Hives and Itching  .  Hydrocodone-Acetaminophen Hives  . Morphine And Related Other (See Comments)    GI PROBLEMS  . Canagliflozin     Other reaction(s): Unknown  . Metformin Hcl     Other reaction(s): diarrhea  . Rosuvastatin Other (See Comments)    Pt reports causes lower extremity muscle aches/cramping  . Lisinopril Cough  . Nsaids Other (See Comments)    GI PROBLEMS     Review of Systems  Constitutional: Negative.  Negative for  fatigue.  Respiratory: Negative.   Cardiovascular: Negative.  Negative for chest pain.  Gastrointestinal: Negative.   Neurological: Negative for weakness.  Psychiatric/Behavioral: Negative.   All other systems reviewed and are negative.    Today's Vitals   03/05/21 1423  BP: 116/74  Pulse: 77  Temp: 98.2 F (36.8 C)  TempSrc: Oral  Weight: 236 lb (107 kg)  Height: _0  (1.702 m)  PainSc: 0-No pain   Body mass index is 36.96 kg/m.  Wt Readings from Last 3 Encounters:  03/12/21 240 lb 12.8 oz (109.2 kg)  03/05/21 236 lb (107 kg)  02/01/21 240 lb (108.9 kg)   BP Readings from Last 3 Encounters:  03/12/21 110/60  03/05/21 116/74  02/01/21 126/70   Objective:  Physical Exam Vitals and nursing note reviewed.  Constitutional:      Appearance: Normal appearance.  HENT:     Head: Normocephalic and atraumatic.     Nose:     Comments: Masked     Mouth/Throat:     Comments: Masked  Cardiovascular:     Rate and Rhythm: Normal rate and regular rhythm.     Heart sounds: Murmur heard.    Pulmonary:     Effort: Pulmonary effort is normal.     Breath sounds: Normal breath sounds.  Skin:    General: Skin is warm.  Neurological:     General: No focal deficit present.     Mental Status: She is alert.  Psychiatric:        Mood and Affect: Mood normal.        Behavior: Behavior normal.          Assessment And Plan:     1. Hypertensive heart and renal disease with renal failure, stage 1 through stage 4 or unspecified chronic kidney disease, with heart  failure ([place]) Comments: Chronic, well controlled. She is encouraged to follow low sodium diet. I will check renal function today.   2. Type 2 diabetes mellitus with stage 3 chronic kidney disease, with long-term current use of insulin, unspecified whether stage 3a or 3b CKD ([place]) Comments: Chronic, followed by Dr. [name]. Her last a1c was 6.0. Encouraged to follow dietary recommendations. - Lipid panel - Liver Profile  3. Atherosclerosis of aorta (HCC) Chronic, encouraged to follow heart healthy lifestyle. Clean eating, regular exercise and stress management are all a part of this lifestyle. She is also encouraged to continue with statin therapy.   - Lipid panel - Liver Profile  4. Palpitations Comments: Recurrent. I wil check electrolytes and magnesium levels. She is encouraged to stay well hydrated.  - Magnesium - BMP8+EGFR  5. Class 2 severe obesity due to excess calories with serious comorbidity and body mass index (BMI) of 36.0 to 36.9 in adult [facility]) Comments: She is encouraged to strive for BMI less than 30 to decrease cardiac risk. Advised to aim for at least 150 minutes of exercise per week.   Patient was given opportunity to ask questions. Patient verbalized understanding of the plan and was able to repeat key elements of the plan. All questions were answered to their satisfaction.   I, [name], MD, have reviewed all documentation for this visit. The documentation on 03/05/21 for the exam, diagnosis, procedures, and orders are all accurate and complete.   IF YOU HAVE BEEN REFERRED TO A SPECIALIST, IT MAY TAKE 1-2 WEEKS TO SCHEDULE/PROCESS THE REFERRAL. IF YOU HAVE NOT HEARD FROM US/SPECIALIST IN TWO WEEKS, PLEASE GIVE [place] A CALL AT  [phone].   THE PATIENT IS ENCOURAGED TO PRACTICE SOCIAL DISTANCING DUE TO THE COVID-19 PANDEMIC.

## 2021-03-06 LAB — HEPATIC FUNCTION PANEL
ALT: 10 IU/L (ref 0–32)
AST: 15 IU/L (ref 0–40)
Albumin: 4.2 g/dL (ref 3.8–4.8)
Alkaline Phosphatase: 130 IU/L — ABNORMAL HIGH (ref 44–121)
Bilirubin Total: 0.3 mg/dL (ref 0.0–1.2)
Bilirubin, Direct: <0.1 mg/dL (ref 0.00–0.40)
Total Protein: 6.9 g/dL (ref 6.0–8.5)

## 2021-03-06 LAB — BMP8+EGFR
BUN/Creatinine Ratio: 23 (ref 12–28)
BUN: 25 mg/dL (ref 8–27)
CO2: 24 mmol/L (ref 20–29)
Calcium: 9.6 mg/dL (ref 8.7–10.3)
Chloride: 103 mmol/L (ref 96–106)
Creatinine, Ser: 1.07 mg/dL — ABNORMAL HIGH (ref 0.57–1.00)
Glucose: 142 mg/dL — ABNORMAL HIGH (ref 65–99)
Potassium: 4.8 mmol/L (ref 3.5–5.2)
Sodium: 141 mmol/L (ref 134–144)
eGFR: 59 mL/min/{1.73_m2} — ABNORMAL LOW (ref >59)

## 2021-03-06 LAB — LIPID PANEL
Chol/HDL Ratio: 3.5 ratio (ref 0.0–4.4)
Cholesterol, Total: 145 mg/dL (ref 100–199)
HDL: 41 mg/dL (ref >39)
LDL Chol Calc (NIH): 81 mg/dL (ref 0–99)
Triglycerides: 126 mg/dL (ref 0–149)
VLDL Cholesterol Cal: 23 mg/dL (ref 5–40)

## 2021-03-06 LAB — MAGNESIUM: Magnesium: 1.8 mg/dL (ref 1.6–2.3)

## 2021-03-07 ENCOUNTER — Other Ambulatory Visit: Payer: Self-pay

## 2021-03-11 NOTE — Progress Notes (Signed)
Cardiology Office Note:    Date:  03/12/2021   ID:  [name], DOB [birth year], MRN [id number]  PCP:  Glendale Chard, MD  Cardiologist:  Ena Dawley, MD (Inactive)   Referring MD: Glendale Chard, MD   Chief Complaint  Patient presents with  . Coronary Artery Disease  . Hyperlipidemia  . Hypertension  . Shortness of Breath    History of Present Illness:    [name] is a 65 y.o. female with a hx of CAD s/p DES to [facility] and mLAD 07/11/15 (patent stents on repeat cath 11/2016),chronic pericardial effusion, h/o chronic pericarditis with chronic pericardial rub treated intermittently with steroids and colchicine, iritis, PAF on Eliquis and dofetilide therapy, chronic diastolic CHF, DM, HTN,remote h/o treatednon-small cell lung cancer, pulmonary fibrosis, aortic insufficiency, COPD and anemia.   CAD with stents placed in 2016.  She was having exertional chest tightness at that time.  Recent cardiac catheterization demonstrated patent stents.  A year or so after the diagnosis of CAD, she developed pericarditis.  It was never fully characterized.  It took on a chronic character requiring colchicine and steroid therapy.  Shortly thereafter she developed rapid heart beating was found to have atrial fibrillation.  This is been managed by Dr. Curt Bears.  She is now on dofetilide and has few episodes.  She is troubled chronically by dyspnea on exertion, intermittent chest pain.  Recent catheterization did not reveal obstructive coronary disease.  Has also a history of chronic pulmonary disease for which she sees Dr. Elsworth Soho.  She has a constellation of problems including pulmonary fibrosis, iritis, unexplained pericarditis raising question of autoimmune disease.  Not have a diagnosis of lupus, systemic sclerosis, or rheumatoid arthritis.  No history of sarcoid.  This is a first office visit with me.  She is here to establish for longitudinal cardiac care.  Past Medical History:  Diagnosis  Date  . Allergy   . Anemia   . Anemia   . Aortic insufficiency    a. Mod by echo 05/2015.  . Arthritis    "knees" (07/11/2015)  . Atrial fibrillation (HCC)    On Eliquis  . 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 Complication of anesthesia    "I had a hard time waking me up w/one of my shoulder surgeries"  . Coronary artery disease    a. Abnormal stress test -> LHc 06/2015 s/p DES to mCX and mLAD, residual D2 disease treated medially.  . Diverticulitis   . Elevated blood pressure   . Erythema nodosum   . Heart murmur   . Insomnia   . lung ca dx'd 10/1999   chemo/xrt comp 05/29/2000  . Myalgia   . Nephrolithiasis 2015  . Obesity   . Pericardial effusion    a. 05/2015 Echo: EF 55-60%, no rwma, Gr 1 DD, no effusion - but an effusion was seen on CT which was felt to be increased in size compared to 12/2014 - pericardium also thickened.   . Pericarditis    a. Dx 05/2015.  [name] Pneumothorax, spontaneous, tension 01/20/2012   October, 2010  . Pulmonary fibrosis ([place]) 01/20/2012   Due to previous surgery and chest radiation for lung cancer  . Restrictive lung disease   . Type II diabetes mellitus ([place])     Past Surgical History:  Procedure Laterality Date  . ABDOMINAL HYSTERECTOMY  2008  . APPENDECTOMY  1969?  . CARDIAC CATHETERIZATION N/A 07/11/2015   Procedure: Left Heart Cath and Coronary Angiography;  Surgeon: Jettie Booze, MD;  Location: [place] [facility];  Service: Cardiovascular;  Laterality: N/A;  . CARDIAC CATHETERIZATION  07/11/2015   Procedure: Coronary Stent Intervention;  Surgeon: Jettie Booze, MD;  Location: [place] [facility];  Service: Cardiovascular;;  . CARPAL TUNNEL RELEASE Bilateral 1998-2005?   right-left  . CARPAL TUNNEL RELEASE Left 2009?   "for the 2nd time"  . COLONOSCOPY    . COLONOSCOPY WITH PROPOFOL N/A 05/28/2018   Procedure:  COLONOSCOPY WITH PROPOFOL;  Surgeon: Milus Banister, MD;  Location: [facility];  Service: Endoscopy;  Laterality: N/A;  . CORONARY ANGIOPLASTY    . CYSTOSCOPY W/ STONE MANIPULATION  2013  . ESOPHAGOGASTRODUODENOSCOPY (EGD) WITH ESOPHAGEAL DILATION  2012  . FOOT SURGERY Bilateral 1980's   Callous removed   . INNER EAR SURGERY Right ~ 2009  . KNEE ARTHROPLASTY Right 1991  . KNEE ARTHROSCOPY  ~ 2000   LATERAL RELEASE  . LEFT HEART CATH AND CORONARY ANGIOGRAPHY N/A 12/18/2016   Procedure: Left Heart Cath and Coronary Angiography;  Surgeon: Burnell Blanks, MD;  Location: [place] [facility];  Service: Cardiovascular;  Laterality: N/A;  . LEFT HEART CATH AND CORONARY ANGIOGRAPHY N/A 02/01/2021   Procedure: LEFT HEART CATH AND CORONARY ANGIOGRAPHY;  Surgeon: Leonie Man, MD;  Location: [place] [facility];  Service: Cardiovascular;  Laterality: N/A;  . LUNG CANCER SURGERY Left 2001   "small cell"  . LUNG SURGERY Right 2007   "reinflated it"  . PULMONARY EMBOLISM SURGERY  2007   LUNG COLLAPSE  . RIGHT AND LEFT HEART CATH N/A 06/25/2017   Procedure: RIGHT AND LEFT HEART CATH;  Surgeon: Sherren Mocha, MD;  Location: [place] [facility];  Service: Cardiovascular;  Laterality: N/A;  . SHOULDER ADHESION RELEASE Right ~ 2004  . SHOULDER ARTHROSCOPY W/ ROTATOR CUFF REPAIR Right ~ 2003    Current Medications: Current Meds  Medication Sig  . Accu-Chek Softclix Lancets lancets USE LANCETS 3 TIMES A DAY AS DIRECTED 30 DAYS  . azaTHIOprine (IMURAN) 50 MG tablet Take 150 mg by mouth daily.  . B-D UF III MINI PEN NEEDLES 31G X 5 MM MISC 2 (two) times daily. as directed  . Blood Glucose Monitoring Suppl (ACCU-CHEK GUIDE) w/Device KIT See admin instructions.  [name] co-enzyme Q-10 50 MG capsule TAKE 1 CAPSULE BY MOUTH EVERY DAY  . diltiazem (CARDIZEM CD) 360 MG 24 hr capsule TAKE 1 CAPSULE BY MOUTH EVERY DAY  . dofetilide (TIKOSYN) 500 MCG capsule TAKE 1 CAPSULE (500 MCG TOTAL) BY MOUTH 2 (TWO)  TIMES DAILY.  [name] ELIQUIS 5 MG TABS tablet TAKE 1 TABLET BY MOUTH TWICE A DAY  . folic acid (FOLVITE) 1 MG tablet Take 1 mg by mouth daily.  . furosemide (LASIX) 40 MG tablet Take 40 mg by mouth daily as needed for fluid or edema.  [name] HUMALOG MIX 75/25 KWIKPEN (75-25) 100 UNIT/ML Kwikpen Inject 30-40 Units into the skin See admin instructions. Inject 40 units SQ in Am and 35 units SQ in PM  . levothyroxine (SYNTHROID, LEVOTHROID) 50 MCG tablet Take 50 mcg by mouth daily before breakfast.   . losartan (COZAAR) 25 MG tablet TAKE 1 TABLET BY MOUTH EVERY DAY  . metoprolol tartrate (LOPRESSOR) 25 MG tablet TAKE 1 TABLET BY MOUTH TWICE A DAY  . nitroGLYCERIN (NITROSTAT) 0.4 MG SL tablet Place 1 tablet (0.4 mg total) under  the tongue every 5 (five) minutes as needed for chest pain.  [name] OZEMPIC, 0.25 OR 0.5 MG/DOSE, 2 MG/1.5ML SOPN INJECT 0.5 MG INTO THE SKIN ONCE A WEEK.  . pravastatin (PRAVACHOL) 20 MG tablet TAKE 1 TABLET BY MOUTH EVERY DAY  . spironolactone (ALDACTONE) 25 MG tablet TAKE 1 TABLET BY MOUTH EVERY DAY     Allergies:   Hydrocodone, Hydrocodone-acetaminophen, Morphine and related, Canagliflozin, Metformin hcl, Rosuvastatin, Lisinopril, and Nsaids   Social History   Socioeconomic History  . Marital status: Single    Spouse name: Not on file  . Number of children: 0  . Years of education: Not on file  . Highest education level: Not on file  Occupational History  . Occupation: RETIRED  Tobacco Use  . Smoking status: Former Smoker    Packs/day: 0.25    Years: 5.00    Pack years: 1.25    Types: Cigarettes    Quit date: 07/22/1999    Years since quitting: 21.6  . Smokeless tobacco: Never Used  Vaping Use  . Vaping Use: Never used  Substance and Sexual Activity  . Alcohol use: No    Alcohol/week: 0.0 standard drinks  . Drug use: No  . Sexual activity: Not Currently    Birth control/protection: Other-see comments    Comment: hysterectomy  Other Topics Concern  . Not on file   Social History Narrative  . Not on file   Social Determinants of Health   Financial Resource Strain: Low Risk   . Difficulty of Paying Living Expenses: Not hard at all  Food Insecurity: No Food Insecurity  . Worried About Charity fundraiser in the Last Year: Never true  . Ran Out of Food in the Last Year: Never true  Transportation Needs: No Transportation Needs  . Lack of Transportation (Medical): No  . Lack of Transportation (Non-Medical): No  Physical Activity: Inactive  . Days of Exercise per Week: 0 days  . Minutes of Exercise per Session: 0 min  Stress: No Stress Concern Present  . Feeling of Stress : Not at all  Social Connections: Not on file     Family History: The patient's family history includes Diabetes in her father, maternal grandmother, mother, paternal grandmother, sister, sister, sister, and sister; Heart disease in her father; Hypertension in her sister; Kidney disease in her sister; Pancreatic cancer in her mother. There is no history of Colon cancer, Colon polyps, Rectal cancer, Stomach cancer, Esophageal cancer, or Breast cancer.  ROS:   Please see the history of present illness.    Snores at night.  Excessive daytime sleepiness, nocturia.  Does not sleep well.  All other systems reviewed and are negative.  EKGs/Labs/Other Studies Reviewed:    The following studies were reviewed today: Cardiac catheterization February 01, 2021 SUMMARY  Left Dominant System with Two-Vessel Disease: ? Proximal LAD focal 20 to 30% calcified nodule just prior to a long stent that is widely patent.  The jailed diagonal branch has 60% stenosis.  LAD is wraparound perfuses the distal third of the PDA territory ? Large dominant LCx with 1 major OM 1, smaller OM 2 before going to the AV groove to give a PL 1 and PDA.  Just after OM1 there is a widely patent stent  Preserved LVEF with normal EDP.    RECOMMENDATIONS  Consider nonanginal chest pain -> potentially could have  residual findings of pericarditis.  Discharge after bedrest Diagnostic Dominance: Left      EKG:  EKG  performed January 30, 2021 demonstrates QT C.  Recent Labs: 09/08/2020: TSH 2.62 01/30/2021: Hemoglobin 11.2; Platelets 292 03/05/2021: ALT 10; BUN 25; Creatinine, Ser 1.07; Magnesium 1.8; Potassium 4.8; Sodium 141  Recent Lipid Panel    Component Value Date/Time   CHOL 145 03/05/2021 1502   TRIG 126 03/05/2021 1502   HDL 41 03/05/2021 1502   CHOLHDL 3.5 03/05/2021 1502   CHOLHDL 3.1 02/17/2019 0215   VLDL 17 02/17/2019 0215   LDLCALC 81 03/05/2021 1502    Physical Exam:    VS:  BP 110/60   Pulse 78   Ht '5\' 7"'  (1.702 m)   Wt 240 lb 12.8 oz (109.2 kg)   SpO2 99%   BMI 37.71 kg/m     Wt Readings from Last 3 Encounters:  03/12/21 240 lb 12.8 oz (109.2 kg)  03/05/21 236 lb (107 kg)  02/01/21 240 lb (108.9 kg)     GEN: Morbid obesity. No acute distress HEENT: Normal NECK: No JVD. LYMPHATICS: No lymphadenopathy CARDIAC: Systolic  murmur. RRR possible pericardial friction rub.  S4 gallop trace bilateral ankle and shin edema. VASCULAR:  Normal Pulses. No bruits. RESPIRATORY:  Clear to auscultation without rales, wheezing or rhonchi  ABDOMEN: Soft, non-tender, non-distended, No pulsatile mass, MUSCULOSKELETAL: No deformity  SKIN: Warm and dry NEUROLOGIC:  Alert and oriented x 3 PSYCHIATRIC:  Normal affect   ASSESSMENT:    1. Persistent atrial fibrillation ([place])   2. Secondary hypercoagulable state ([place])   3. Chest pain of uncertain etiology   4. Pericardial effusion   5. Hypertension, essential   6. Chronic diastolic (congestive) heart failure (HCC)   7. Coronary artery disease involving native coronary artery of native heart without angina pectoris   8. Hyperlipidemia, unspecified hyperlipidemia type   9. Snoring   10. Dyspnea on exertion    PLAN:    In order of problems listed above:  1. Resolved, and with good rhythm control on dofetilide started by  Dr. Curt Bears. 2. Continue Eliquis.  Would not use aspirin given increased propensity for bleeding on both. 3. Etiology uncertain.  Possibly related to chronic pericardial disease 4. Etiology is uncertain.  Could be related to an underlying autoimmune disease not yet characterized. 5. Well-controlled 6. May be volume overloaded but chooses not to take diuretic regularly because of urinary frequency.  She is on spironolactone. 7. Recent cath demonstrated widely patent coronaries without evidence of stent restenosis. 8. Continue statin therapy. 9. Some of her exertional dyspnea and fatigue could be related to chronic sleep deprivation.  A sleep study will be ordered. 10. Could be related to chronic pericardial disease with elevated diastolic pressures.     Medication Adjustments/Labs and Tests Ordered: Current medicines are reviewed at length with the patient today.  Concerns regarding medicines are outlined above.  Orders Placed This Encounter  Procedures  . Split night study   No orders of the defined types were placed in this encounter.   Patient Instructions  Medication Instructions:  Your physician recommends that you continue on your current medications as directed. Please refer to the Current Medication list given to you today.  *If you need a refill on your cardiac medications before your next appointment, please call your pharmacy*   Lab Work: None If you have labs (blood work) drawn today and your tests are completely normal, you will receive your results only by: [name] MyChart Message (if you have MyChart) OR . A paper copy in the mail If you have any lab  test that is abnormal or we need to change your treatment, we will call you to review the results.   Testing/Procedures: Your physician has recommended that you have a sleep study. This test records several body functions during sleep, including: brain activity, eye movement, oxygen and carbon dioxide blood levels, heart  rate and rhythm, breathing rate and rhythm, the flow of air through your mouth and nose, snoring, body muscle movements, and chest and belly movement.   Follow-Up: At [facility], you and your health needs are our priority.  As part of our continuing mission to provide you with exceptional heart care, we have created designated Provider Care Teams.  These Care Teams include your primary Cardiologist (physician) and Advanced Practice Providers (APPs -  Physician Assistants and Nurse Practitioners) who all work together to provide you with the care you need, when you need it.  We recommend signing up for the patient portal called "MyChart".  Sign up information is provided on this After Visit Summary.  MyChart is used to connect with patients for Virtual Visits (Telemedicine).  Patients are able to view lab/test results, encounter notes, upcoming appointments, etc.  Non-urgent messages can be sent to your provider as well.   To learn more about what you can do with MyChart, go to [email or web address].    Your next appointment:   6 month(s)  The format for your next appointment:   In Person  Provider:   You may see Daneen Schick, MD or one of the following Advanced Practice Providers on your designated Care Team:    Kathyrn Drown, NP    Other Instructions      Signed, Sinclair Grooms, MD  03/12/2021 4:46 PM    [place]

## 2021-03-12 ENCOUNTER — Encounter: Payer: Self-pay | Admitting: Interventional Cardiology

## 2021-03-12 ENCOUNTER — Ambulatory Visit: Payer: Medicare PPO | Admitting: Interventional Cardiology

## 2021-03-12 ENCOUNTER — Other Ambulatory Visit: Payer: Self-pay

## 2021-03-12 ENCOUNTER — Telehealth: Payer: Self-pay | Admitting: *Deleted

## 2021-03-12 VITALS — BP 110/60 | HR 78 | Ht 67.0 in | Wt 240.8 lb

## 2021-03-12 DIAGNOSIS — I2510 Atherosclerotic heart disease of native coronary artery without angina pectoris: Secondary | ICD-10-CM

## 2021-03-12 DIAGNOSIS — D6869 Other thrombophilia: Secondary | ICD-10-CM | POA: Diagnosis not present

## 2021-03-12 DIAGNOSIS — E785 Hyperlipidemia, unspecified: Secondary | ICD-10-CM

## 2021-03-12 DIAGNOSIS — I10 Essential (primary) hypertension: Secondary | ICD-10-CM | POA: Diagnosis not present

## 2021-03-12 DIAGNOSIS — I5032 Chronic diastolic (congestive) heart failure: Secondary | ICD-10-CM | POA: Diagnosis not present

## 2021-03-12 DIAGNOSIS — I313 Pericardial effusion (noninflammatory): Secondary | ICD-10-CM

## 2021-03-12 DIAGNOSIS — R0600 Dyspnea, unspecified: Secondary | ICD-10-CM

## 2021-03-12 DIAGNOSIS — R0683 Snoring: Secondary | ICD-10-CM | POA: Diagnosis not present

## 2021-03-12 DIAGNOSIS — I4819 Other persistent atrial fibrillation: Secondary | ICD-10-CM | POA: Diagnosis not present

## 2021-03-12 DIAGNOSIS — I3139 Other pericardial effusion (noninflammatory): Secondary | ICD-10-CM

## 2021-03-12 DIAGNOSIS — R079 Chest pain, unspecified: Secondary | ICD-10-CM | POA: Diagnosis not present

## 2021-03-12 DIAGNOSIS — R0609 Other forms of dyspnea: Secondary | ICD-10-CM

## 2021-03-12 NOTE — Telephone Encounter (Signed)
-----   Message from Loren Racer, RN sent at 03/12/2021  4:38 PM EDT ----- Sleep study ordered

## 2021-03-12 NOTE — Patient Instructions (Signed)
Medication Instructions:  Your physician recommends that you continue on your current medications as directed. Please refer to the Current Medication list given to you today.  *If you need a refill on your cardiac medications before your next appointment, please call your pharmacy*   Lab Work: None If you have labs (blood work) drawn today and your tests are completely normal, you will receive your results only by: [name] MyChart Message (if you have MyChart) OR . A paper copy in the mail If you have any lab test that is abnormal or we need to change your treatment, we will call you to review the results.   Testing/Procedures: Your physician has recommended that you have a sleep study. This test records several body functions during sleep, including: brain activity, eye movement, oxygen and carbon dioxide blood levels, heart rate and rhythm, breathing rate and rhythm, the flow of air through your mouth and nose, snoring, body muscle movements, and chest and belly movement.   Follow-Up: At [facility], you and your health needs are our priority.  As part of our continuing mission to provide you with exceptional heart care, we have created designated Provider Care Teams.  These Care Teams include your primary Cardiologist (physician) and Advanced Practice Providers (APPs -  Physician Assistants and Nurse Practitioners) who all work together to provide you with the care you need, when you need it.  We recommend signing up for the patient portal called "MyChart".  Sign up information is provided on this After Visit Summary.  MyChart is used to connect with patients for Virtual Visits (Telemedicine).  Patients are able to view lab/test results, encounter notes, upcoming appointments, etc.  Non-urgent messages can be sent to your provider as well.   To learn more about what you can do with MyChart, go to [email or web address].    Your next appointment:   6 month(s)  The format for your next  appointment:   In Person  Provider:   You may see Daneen Schick, MD or one of the following Advanced Practice Providers on your designated Care Team:    Kathyrn Drown, NP    Other Instructions

## 2021-03-13 DIAGNOSIS — R002 Palpitations: Secondary | ICD-10-CM | POA: Insufficient documentation

## 2021-03-13 DIAGNOSIS — I700 Atherosclerosis of aorta: Secondary | ICD-10-CM | POA: Insufficient documentation

## 2021-03-15 ENCOUNTER — Other Ambulatory Visit: Payer: Self-pay

## 2021-03-15 MED ORDER — FUROSEMIDE 40 MG PO TABS: 40.0000 mg | ORAL_TABLET | Freq: Every day | ORAL | 6 refills | Status: AC | PRN

## 2021-03-21 NOTE — Telephone Encounter (Signed)
Prior Authorization for [name] sent to [facility] via web portal. Tracking Number # [id number]

## 2021-03-22 DIAGNOSIS — M1712 Unilateral primary osteoarthritis, left knee: Secondary | ICD-10-CM | POA: Diagnosis not present

## 2021-03-22 DIAGNOSIS — M19042 Primary osteoarthritis, left hand: Secondary | ICD-10-CM | POA: Diagnosis not present

## 2021-03-30 DIAGNOSIS — Z20822 Contact with and (suspected) exposure to covid-19: Secondary | ICD-10-CM | POA: Diagnosis not present

## 2021-04-03 ENCOUNTER — Telehealth: Payer: Self-pay

## 2021-04-03 NOTE — Telephone Encounter (Signed)
Spoke with pt regarding her sleep study. Pt was informed her appt is 05/21/21 at 8pm. Pt agreed and confirmed appt.

## 2021-04-06 DIAGNOSIS — Z79899 Other long term (current) drug therapy: Secondary | ICD-10-CM | POA: Diagnosis not present

## 2021-04-06 DIAGNOSIS — Z794 Long term (current) use of insulin: Secondary | ICD-10-CM | POA: Diagnosis not present

## 2021-04-06 DIAGNOSIS — E1136 Type 2 diabetes mellitus with diabetic cataract: Secondary | ICD-10-CM | POA: Diagnosis not present

## 2021-04-06 DIAGNOSIS — H2513 Age-related nuclear cataract, bilateral: Secondary | ICD-10-CM | POA: Diagnosis not present

## 2021-04-06 DIAGNOSIS — H35033 Hypertensive retinopathy, bilateral: Secondary | ICD-10-CM | POA: Diagnosis not present

## 2021-04-06 DIAGNOSIS — H15102 Unspecified episcleritis, left eye: Secondary | ICD-10-CM | POA: Diagnosis not present

## 2021-04-06 DIAGNOSIS — H15002 Unspecified scleritis, left eye: Secondary | ICD-10-CM | POA: Diagnosis not present

## 2021-04-06 NOTE — Telephone Encounter (Signed)
Patient is scheduled for lab study on 8/17. Patient understands her sleep study will be done at [facility] sleep lab. Patient understands she will receive a sleep packet in a week or so. Patient understands to call if she does not receive the sleep packet in a timely manner.  Patient agrees with treatment and thanked me for call.

## 2021-04-06 NOTE — Telephone Encounter (Signed)
Scheduled 8/17

## 2021-04-25 ENCOUNTER — Ambulatory Visit: Payer: Medicare PPO | Admitting: Internal Medicine

## 2021-04-25 ENCOUNTER — Other Ambulatory Visit: Payer: Self-pay

## 2021-04-25 ENCOUNTER — Encounter: Payer: Self-pay | Admitting: Internal Medicine

## 2021-04-25 VITALS — BP 114/68 | HR 69 | Temp 98.4°F | Ht 67.0 in | Wt 236.4 lb

## 2021-04-25 DIAGNOSIS — H9203 Otalgia, bilateral: Secondary | ICD-10-CM | POA: Diagnosis not present

## 2021-04-25 DIAGNOSIS — N390 Urinary tract infection, site not specified: Secondary | ICD-10-CM

## 2021-04-25 DIAGNOSIS — R42 Dizziness and giddiness: Secondary | ICD-10-CM | POA: Diagnosis not present

## 2021-04-25 DIAGNOSIS — E7800 Pure hypercholesterolemia, unspecified: Secondary | ICD-10-CM | POA: Diagnosis not present

## 2021-04-25 LAB — POCT URINALYSIS DIPSTICK
Bilirubin, UA: NEGATIVE
Glucose, UA: NEGATIVE
Ketones, UA: NEGATIVE
Leukocytes, UA: NEGATIVE
Nitrite, UA: POSITIVE
Protein, UA: NEGATIVE
Spec Grav, UA: >=1.03 — AB (ref 1.010–1.025)
Urobilinogen, UA: 0.2 E.U./dL
pH, UA: 5.5 (ref 5.0–8.0)

## 2021-04-25 MED ORDER — MECLIZINE HCL 12.5 MG PO TABS: 12.5000 mg | ORAL_TABLET | Freq: Three times a day (TID) | ORAL | 0 refills | Status: DC | PRN

## 2021-04-25 NOTE — Patient Instructions (Signed)
Mediterranean Diet A Mediterranean diet refers to food and lifestyle choices that are based on the traditions of countries located on the [place]. This way of eating has been shown to help prevent certain conditions and improve outcomes for people who have chronic diseases, like kidney disease and heart disease. What are tips for following this plan? Lifestyle  Cook and eat meals together with your family, when possible.  Drink enough fluid to keep your urine clear or pale yellow.  Be physically active every day. This includes: ? Aerobic exercise like running or swimming. ? Leisure activities like gardening, walking, or housework.  Get 7-8 hours of sleep each night.  If recommended by your health care provider, drink red wine in moderation. This means 1 glass a day for nonpregnant women and 2 glasses a day for men. A glass of wine equals 5 oz (150 mL). Reading food labels  Check the serving size of packaged foods. For foods such as rice and pasta, the serving size refers to the amount of cooked product, not dry.  Check the total fat in packaged foods. Avoid foods that have saturated fat or trans fats.  Check the ingredients list for added sugars, such as corn syrup.   Shopping  At the grocery store, buy most of your food from the areas near the walls of the store. This includes: ? Fresh fruits and vegetables (produce). ? Grains, beans, nuts, and seeds. Some of these may be available in unpackaged forms or large amounts (in bulk). ? Fresh seafood. ? Poultry and eggs. ? Low-fat dairy products.  Buy whole ingredients instead of prepackaged foods.  Buy fresh fruits and vegetables in-season from local farmers markets.  Buy frozen fruits and vegetables in resealable bags.  If you do not have access to quality fresh seafood, buy precooked frozen shrimp or canned fish, such as tuna, salmon, or sardines.  Buy small amounts of raw or cooked vegetables, salads, or olives from  the deli or salad bar at your store.  Stock your pantry so you always have certain foods on hand, such as olive oil, canned tuna, canned tomatoes, rice, pasta, and beans. Cooking  Cook foods with extra-virgin olive oil instead of using butter or other vegetable oils.  Have meat as a side dish, and have vegetables or grains as your main dish. This means having meat in small portions or adding small amounts of meat to foods like pasta or stew.  Use beans or vegetables instead of meat in common dishes like chili or lasagna.  Experiment with different cooking methods. Try roasting or broiling vegetables instead of steaming or sauteing them.  Add frozen vegetables to soups, stews, pasta, or rice.  Add nuts or seeds for added healthy fat at each meal. You can add these to yogurt, salads, or vegetable dishes.  Marinate fish or vegetables using olive oil, lemon juice, garlic, and fresh herbs. Meal planning  Plan to eat 1 vegetarian meal one day each week. Try to work up to 2 vegetarian meals, if possible.  Eat seafood 2 or more times a week.  Have healthy snacks readily available, such as: ? Vegetable sticks with hummus. ? Greek yogurt. ? Fruit and nut trail mix.  Eat balanced meals throughout the week. This includes: ? Fruit: 2-3 servings a day ? Vegetables: 4-5 servings a day ? Low-fat dairy: 2 servings a day ? Fish, poultry, or lean meat: 1 serving a day ? Beans and legumes: 2 or more servings a week ?   Nuts and seeds: 1-2 servings a day ? Whole grains: 6-8 servings a day ? Extra-virgin olive oil: 3-4 servings a day  Limit red meat and sweets to only a few servings a month   What are my food choices?  Mediterranean diet ? Recommended  Grains: Whole-grain pasta. Brown rice. Bulgar wheat. Polenta. Couscous. Whole-wheat bread. Oatmeal. Quinoa.  Vegetables: Artichokes. Beets. Broccoli. Cabbage. Carrots. Eggplant. Green beans. Chard. Kale. Spinach. Onions. Leeks. Peas. Squash.  Tomatoes. Peppers. Radishes.  Fruits: Apples. Apricots. Avocado. Berries. Bananas. Cherries. Dates. Figs. Grapes. Lemons. Melon. Oranges. Peaches. Plums. Pomegranate.  Meats and other protein foods: Beans. Almonds. Sunflower seeds. Pine nuts. Peanuts. Cod. Salmon. Scallops. Shrimp. Tuna. Tilapia. Clams. Oysters. Eggs.  Dairy: Low-fat milk. Cheese. Greek yogurt.  Beverages: Water. Red wine. Herbal tea.  Fats and oils: Extra virgin olive oil. Avocado oil. Grape seed oil.  Sweets and desserts: Greek yogurt with honey. Baked apples. Poached pears. Trail mix.  Seasoning and other foods: Basil. Cilantro. Coriander. Cumin. Mint. Parsley. Sage. Rosemary. Tarragon. Garlic. Oregano. Thyme. Pepper. Balsalmic vinegar. Tahini. Hummus. Tomato sauce. Olives. Mushrooms. ? Limit these  Grains: Prepackaged pasta or rice dishes. Prepackaged cereal with added sugar.  Vegetables: Deep fried potatoes (french fries).  Fruits: Fruit canned in syrup.  Meats and other protein foods: Beef. Pork. Lamb. Poultry with skin. Hot dogs. Bacon.  Dairy: Ice cream. Sour cream. Whole milk.  Beverages: Juice. Sugar-sweetened soft drinks. Beer. Liquor and spirits.  Fats and oils: Butter. Canola oil. Vegetable oil. Beef fat (tallow). Lard.  Sweets and desserts: Cookies. Cakes. Pies. Candy.  Seasoning and other foods: Mayonnaise. Premade sauces and marinades. The items listed may not be a complete list. Talk with your dietitian about what dietary choices are right for you. Summary  The Mediterranean diet includes both food and lifestyle choices.  Eat a variety of fresh fruits and vegetables, beans, nuts, seeds, and whole grains.  Limit the amount of red meat and sweets that you eat.  Talk with your health care provider about whether it is safe for you to drink red wine in moderation. This means 1 glass a day for nonpregnant women and 2 glasses a day for men. A glass of wine equals 5 oz (150 mL). This information  is not intended to replace advice given to you by your health care provider. Make sure you discuss any questions you have with your health care provider. Document Revised: 06/06/2016 Document Reviewed: 05/30/2016 Elsevier Patient Education  2020 [employer] Inc.  

## 2021-04-25 NOTE — Progress Notes (Signed)
I,Katawbba Wiggins,acting as a Education administrator for [name], MD.,have documented all relevant documentation on the behalf of [name], MD,as directed by  [name], MD while in the presence of [name], MD.  This visit occurred during the SARS-CoV-2 public health emergency.  Safety protocols were in place, including screening questions prior to the visit, additional usage of staff PPE, and extensive cleaning of exam room while observing appropriate contact time as indicated for disinfecting solutions.  Subjective:     Patient ID: [name] , female    DOB: 10-Jan-1956 , 65 y.o.   MRN: [id number]   Chief Complaint  Patient presents with   Hyperlipidemia    HPI  The patient is here today for a cholesterol f/u.  The dose of pravastatin was increased to 52m at her last visit. She has not had any issues with this regimen. She has been taking TWO pravastatin 224mtablets daily.     Past Medical History:  Diagnosis Date   Allergy    Anemia    Anemia    Aortic insufficiency    a. Mod by echo 05/2015.   Arthritis    "knees" (07/11/2015)   Atrial fibrillation (HCC)    On Eliquis   Carcinoma of hilus of lung (HCPetrey4/10/2011   IIIB NSCL  Right hilar mass compressing esophagus/presenting with dysphagia Rx Surgery/RT/chemo dx January 201540 Complication of anesthesia    "I had a hard time waking me up w/one of my shoulder surgeries"   Coronary artery disease    a. Abnormal stress test -> [facility]c 06/2015 s/p DES to mCX and mLAD, residual D2 disease treated medially.   Diverticulitis    Elevated blood pressure    Erythema nodosum    Heart murmur    Insomnia    lung ca dx'd 10/1999   chemo/xrt comp 05/29/2000   Myalgia    Nephrolithiasis 2015   Obesity    Pericardial effusion    a. 05/2015 Echo: EF 55-60%, no rwma, Gr 1 DD, no effusion - but an effusion was seen on CT which was felt to be increased in size compared to 12/2014 - pericardium also thickened.    Pericarditis    a.  Dx 05/2015.   Pneumothorax, spontaneous, tension 01/20/2012   October, 2010   Pulmonary fibrosis (HCReagan4/10/2011   Due to previous surgery and chest radiation for lung cancer   Restrictive lung disease    Type II diabetes mellitus ([facility]     Family History  Problem Relation Age of Onset   Heart disease Father    Diabetes Father    Pancreatic cancer Mother    Diabetes Mother    Hypertension Sister        x 3   Diabetes Sister        x 4   Diabetes Maternal Grandmother    Diabetes Paternal Grandmother    Diabetes Sister    Diabetes Sister    Diabetes Sister    Kidney disease Sister    Colon cancer Neg Hx    Colon polyps Neg Hx    Rectal cancer Neg Hx    Stomach cancer Neg Hx    Esophageal cancer Neg Hx    Breast cancer Neg Hx      Current Outpatient Medications:    Accu-Chek Softclix Lancets lancets, USE LANCETS 3 TIMES A DAY AS DIRECTED 30 DAYS, Disp: , Rfl:    azaTHIOprine (IMURAN) 50 MG tablet,  Take 150 mg by mouth daily., Disp: , Rfl:    B-D UF III MINI PEN NEEDLES 31G X 5 MM MISC, 2 (two) times daily. as directed, Disp: , Rfl:    Blood Glucose Monitoring Suppl (ACCU-CHEK GUIDE) w/Device KIT, See admin instructions., Disp: , Rfl:    co-enzyme Q-10 50 MG capsule, TAKE 1 CAPSULE BY MOUTH EVERY DAY, Disp: 30 capsule, Rfl: 7   diltiazem (CARDIZEM CD) 360 MG 24 hr capsule, TAKE 1 CAPSULE BY MOUTH EVERY DAY, Disp: 90 capsule, Rfl: 1   dofetilide (TIKOSYN) 500 MCG capsule, TAKE 1 CAPSULE (500 MCG TOTAL) BY MOUTH 2 (TWO) TIMES DAILY., Disp: 180 capsule, Rfl: 1   ELIQUIS 5 MG TABS tablet, TAKE 1 TABLET BY MOUTH TWICE A DAY, Disp: 180 tablet, Rfl: 1   folic acid (FOLVITE) 1 MG tablet, Take 1 mg by mouth daily., Disp: , Rfl:    furosemide (LASIX) 40 MG tablet, Take 1 tablet (40 mg total) by mouth daily as needed for fluid or edema., Disp: 30 tablet, Rfl: 6   HUMALOG MIX 75/25 KWIKPEN (75-25) 100 UNIT/ML Kwikpen, Inject 30-40 Units into the skin See admin instructions. Inject 40 units  SQ in Am and 35 units SQ in PM, Disp: , Rfl: 6   levothyroxine (SYNTHROID, LEVOTHROID) 50 MCG tablet, Take 50 mcg by mouth daily before breakfast. , Disp: , Rfl: 6   losartan (COZAAR) 25 MG tablet, TAKE 1 TABLET BY MOUTH EVERY DAY, Disp: 90 tablet, Rfl: 1   meclizine (ANTIVERT) 12.5 MG tablet, Take 1 tablet (12.5 mg total) by mouth 3 (three) times daily as needed for dizziness., Disp: 30 tablet, Rfl: 0   metoprolol tartrate (LOPRESSOR) 25 MG tablet, TAKE 1 TABLET BY MOUTH TWICE A DAY, Disp: 180 tablet, Rfl: 3   nitroGLYCERIN (NITROSTAT) 0.4 MG SL tablet, Place 1 tablet (0.4 mg total) under the tongue every 5 (five) minutes as needed for chest pain., Disp: 25 tablet, Rfl: 3   OZEMPIC, 0.25 OR 0.5 MG/DOSE, 2 MG/1.5ML SOPN, INJECT 0.5 MG INTO THE SKIN ONCE A WEEK. (Patient taking differently: as needed.), Disp: 3 pen, Rfl: 3   pravastatin (PRAVACHOL) 20 MG tablet, TAKE 1 TABLET BY MOUTH EVERY DAY, Disp: 90 tablet, Rfl: 2   spironolactone (ALDACTONE) 25 MG tablet, TAKE 1 TABLET BY MOUTH EVERY DAY, Disp: 90 tablet, Rfl: 2   Allergies  Allergen Reactions   Hydrocodone Hives and Itching   Hydrocodone-Acetaminophen Hives   Morphine And Related Other (See Comments)    GI PROBLEMS   Canagliflozin     Other reaction(s): Unknown   Metformin Hcl     Other reaction(s): diarrhea   Rosuvastatin Other (See Comments)    Pt reports causes lower extremity muscle aches/cramping   Lisinopril Cough   Nsaids Other (See Comments)    GI PROBLEMS     Review of Systems  Constitutional: Negative.   HENT:  Positive for ear pain.        She c/o b/l ear pain. Denies fever/chills. No recent URI. Not sure what is causing her sx.   Respiratory: Negative.    Cardiovascular: Negative.   Gastrointestinal: Negative.   Neurological:  Positive for dizziness.  Psychiatric/Behavioral: Negative.    All other systems reviewed and are negative.   Today's Vitals   04/25/21 1543  BP: 114/68  Pulse: 69  Temp: 98.4 F (36.9  C)  TempSrc: Oral  Weight: 236 lb 6.4 oz (107.2 kg)  Height: '5\' 7"'  (1.702 m)   Body  mass index is 37.03 kg/m.  Wt Readings from Last 3 Encounters:  05/16/21 236 lb 6 oz (107.2 kg)  04/25/21 236 lb 6.4 oz (107.2 kg)  03/12/21 240 lb 12.8 oz (109.2 kg)    BP Readings from Last 3 Encounters:  05/16/21 110/60  04/25/21 114/68  03/12/21 110/60    Objective:  Physical Exam Vitals and nursing note reviewed.  Constitutional:      Appearance: Normal appearance. She is obese.  HENT:     Head: Normocephalic and atraumatic.     Nose:     Comments: Masked     Mouth/Throat:     Comments: Masked  Eyes:     Extraocular Movements: Extraocular movements intact.  Cardiovascular:     Rate and Rhythm: Normal rate and regular rhythm.     Heart sounds: Normal heart sounds.  Pulmonary:     Effort: Pulmonary effort is normal.     Breath sounds: Normal breath sounds.  Skin:    General: Skin is warm.  Neurological:     General: No focal deficit present.     Mental Status: She is alert.  Psychiatric:        Mood and Affect: Mood normal.        Behavior: Behavior normal.        Assessment And Plan:     1. Pure hypercholesterolemia Comments: Chronic, I will check labs as listed below. I will adjust meds as indicated. She is encouraged to incorporate a heart healthy diet.  - Lipid panel - ALT  2. Otalgia of both ears Comments: Ear exam was benign. No indication for abx.   3. Vertigo Comments: She was given rx meclizine to take as needed. She is encouraged to let me know if her sx persist for more than another 2-3 days. Has upcoming ENT eval.   4. Urinary tract infection without hematuria, site unspecified Comments: I will check urine culture and treat as indicated.  - Urine Culture - POCT Urinalysis Dipstick (53005)    Patient was given opportunity to ask questions. Patient verbalized understanding of the plan and was able to repeat key elements of the plan. All questions were  answered to their satisfaction.   I, [name], MD, have reviewed all documentation for this visit. The documentation on 05/18/21 for the exam, diagnosis, procedures, and orders are all accurate and complete.   IF YOU HAVE BEEN REFERRED TO A SPECIALIST, IT MAY TAKE 1-2 WEEKS TO SCHEDULE/PROCESS THE REFERRAL. IF YOU HAVE NOT HEARD FROM US/SPECIALIST IN TWO WEEKS, PLEASE GIVE [place] A CALL AT [phone] X [phone].   THE PATIENT IS ENCOURAGED TO PRACTICE SOCIAL DISTANCING DUE TO THE COVID-19 PANDEMIC.

## 2021-04-26 LAB — ALT: ALT: 9 IU/L (ref 0–32)

## 2021-04-26 LAB — LIPID PANEL
Chol/HDL Ratio: 3 ratio (ref 0.0–4.4)
Cholesterol, Total: 130 mg/dL (ref 100–199)
HDL: 44 mg/dL (ref >39)
LDL Chol Calc (NIH): 67 mg/dL (ref 0–99)
Triglycerides: 102 mg/dL (ref 0–149)
VLDL Cholesterol Cal: 19 mg/dL (ref 5–40)

## 2021-04-29 LAB — URINE CULTURE

## 2021-04-30 DIAGNOSIS — H905 Unspecified sensorineural hearing loss: Secondary | ICD-10-CM | POA: Diagnosis not present

## 2021-04-30 DIAGNOSIS — Z9889 Other specified postprocedural states: Secondary | ICD-10-CM | POA: Diagnosis not present

## 2021-05-03 ENCOUNTER — Telehealth: Payer: Self-pay | Admitting: Gastroenterology

## 2021-05-03 ENCOUNTER — Telehealth: Payer: Self-pay | Admitting: Interventional Cardiology

## 2021-05-03 NOTE — Telephone Encounter (Signed)
Pt states that she has been vomiting blood since last week. She would like to be seen asap.

## 2021-05-03 NOTE — Telephone Encounter (Signed)
Left message on machine to call back  

## 2021-05-03 NOTE — Telephone Encounter (Signed)
The pt called to say that when she brushes her teeth in the mornings she spits blood.  She says it is NOT vomiting it is more some BRB when she gags.  No other symptoms. She does have a history of gastric lesion in 2017-Dr [facility] EGD impression: mid to proximal esophageal narrowing with a few associated AVMs, presumed site of chronic radiation damage (remoted lung cancer).  She has a call to her oncologist, cardiologist and PCP.  I have her scheduled for office visit with Amy on 05/16/21 at 1:30 pm.  Dr Ardis Hughs please review if this date is appropriate.

## 2021-05-03 NOTE — Telephone Encounter (Signed)
Her blood thinner certainly doesn't help I'm sure.  I think appt date with Amy is reasonable. Please let her know she also needs to see her dentist soon as well (before OV with AMy would be great).  ENT referral may also be appropriate pending GI eval.

## 2021-05-03 NOTE — Telephone Encounter (Signed)
Pt is calling in regards to her spitting up blood when she brushes the back of her tongue, pt has spit up blood 2x this week and 1x last week. Please advise pt further

## 2021-05-03 NOTE — Telephone Encounter (Signed)
Okay to hold Eliquis although there would be increased risk of clots and heart that could cause stroke. It is not normal to have spontaneous bleeding even on Eliquis, therefore she needs to follow-up with primary care, pulmonary, and ENT to figure out why the bleeding is occurring. Should not be off the Eliquis indefinitely without having this evaluated.

## 2021-05-03 NOTE — Telephone Encounter (Signed)
Called patient back with Dr. Thompson Caul recommendations. Patient verbalized understanding. Patient stated she would probably hold her eliquis only for a few days to see if that helps. Patient stated she has appointment next week with her GI doctor. Patient stated she would call with any other concerns or updates.

## 2021-05-03 NOTE — Telephone Encounter (Signed)
Called patient about her message. Patient stated she has been coughing up blood for a couple of days for a couple of weeks. Patient stated she saw her ENT and he told her to call who prescribes her eliquis to see about holding the medication. Informed patient that a message would be sent to Dr. Tamala Julian to see if she can hold her eliquis and how long. Encouraged patient to call her PCP about the coughing up blood and to get evaluated for the cause, since she has history of esophageal issues.

## 2021-05-04 ENCOUNTER — Other Ambulatory Visit: Payer: Self-pay

## 2021-05-04 MED ORDER — CEPHALEXIN 500 MG PO CAPS: 500.0000 mg | ORAL_CAPSULE | Freq: Two times a day (BID) | ORAL | 0 refills | Status: AC

## 2021-05-04 NOTE — Telephone Encounter (Signed)
I spoke with the pt and she tells me that she followed up with her ENT last Tuesday and will have those records faxed for review.  She was also advised to follow up with her dentist.  She agrees and will make an appt and keep appt with Amy.

## 2021-05-07 ENCOUNTER — Ambulatory Visit: Payer: Self-pay | Admitting: Nurse Practitioner

## 2021-05-14 ENCOUNTER — Telehealth: Payer: Self-pay

## 2021-05-14 NOTE — Telephone Encounter (Signed)
The pt stated that she came for an appt at 1:30 today because she thought she had an appt today, that she isn't spitting up blood anymore and that she sees her GI on Wednesday about the same thing.

## 2021-05-16 ENCOUNTER — Other Ambulatory Visit (INDEPENDENT_AMBULATORY_CARE_PROVIDER_SITE_OTHER): Payer: Medicare PPO

## 2021-05-16 ENCOUNTER — Encounter: Payer: Self-pay | Admitting: Physician Assistant

## 2021-05-16 ENCOUNTER — Ambulatory Visit: Payer: Medicare PPO | Admitting: Physician Assistant

## 2021-05-16 VITALS — BP 110/60 | HR 84 | Ht 67.0 in | Wt 236.4 lb

## 2021-05-16 DIAGNOSIS — R042 Hemoptysis: Secondary | ICD-10-CM

## 2021-05-16 DIAGNOSIS — R1310 Dysphagia, unspecified: Secondary | ICD-10-CM

## 2021-05-16 LAB — CBC WITH DIFFERENTIAL/PLATELET
Basophils Absolute: 0 10*3/uL (ref 0.0–0.1)
Basophils Relative: 0.6 % (ref 0.0–3.0)
Eosinophils Absolute: 0.3 10*3/uL (ref 0.0–0.7)
Eosinophils Relative: 5 % (ref 0.0–5.0)
HCT: 34.7 % — ABNORMAL LOW (ref 36.0–46.0)
Hemoglobin: 11.4 g/dL — ABNORMAL LOW (ref 12.0–15.0)
Lymphocytes Relative: 22.8 % (ref 12.0–46.0)
Lymphs Abs: 1.3 10*3/uL (ref 0.7–4.0)
MCHC: 32.8 g/dL (ref 30.0–36.0)
MCV: 97.5 fl (ref 78.0–100.0)
Monocytes Absolute: 0.5 10*3/uL (ref 0.1–1.0)
Monocytes Relative: 9.3 % (ref 3.0–12.0)
Neutro Abs: 3.7 10*3/uL (ref 1.4–7.7)
Neutrophils Relative %: 62.3 % (ref 43.0–77.0)
Platelets: 284 10*3/uL (ref 150.0–400.0)
RBC: 3.56 Mil/uL — ABNORMAL LOW (ref 3.87–5.11)
RDW: 13 % (ref 11.5–15.5)
WBC: 5.9 10*3/uL (ref 4.0–10.5)

## 2021-05-16 NOTE — Progress Notes (Signed)
Subjective:    Patient ID: [name], female    DOB: 09-Jul-1956, 65 y.o.   MRN: [id number]  HPI  [name] is a pleasant 65 year old African-American female, established with Dr. Ardis Hughs.  She was last seen here in 2019.  She comes in today with complaints of recent intermittent small-volume hematemesis.  She says she has been spitting up very small amounts of dark red blood off and on over the past couple of months.  This Has Been Infrequent and the Last Episode Was about 3 Weeks Ago.  It sounds as if this happens when she brushes her tongue which causes her to gag.  Usually this will make her bring up a little bit of sputum but as above has had a few episodes of seeing a small amount of heme. She also has chronic complaints of dysphagia which she says has been present over the past couple of years and occurs on a daily basis.  She will have frequent episodes of sensation of food "getting hung up".  Most of the time if she stops eating the food will gradually go on down, has had a few episodes requiring regurgitation.  She also has a sensation of dysphagia to liquids if she drinks a lot of fluid at 1 time, no symptoms if she sips through a straw.  No complaints of acid reflux, heartburn or indigestion. Patient is on chronic Eliquis with history of atrial fibrillation.  Also with multiple other comorbidities including coronary artery disease, hypertension, peripheral vascular disease, adult onset diabetes mellitus, chronic kidney disease and her remote history of lung cancer diagnosed in 2001.  She had undergone resection and then chemoradiation.  Also with history of pulmonary fibrosis, iritis, unexplained pericarditis raising question of underlying autoimmune disease. Her most recent cath was done in April 2022, EF 55 to 60%, noted to have two-vessel disease, proximal LAD focal 20 to 30% calcified nodule just prior to the long stented is widely patent, jailed diagonal branch with 60% stenosis.  LAD is  wraparound perfusing the distal third of the PDA territory also with large dominant circumflex with 1 major OM1.  Just after OM1 there was widely patent stent.  Last colonoscopy was done in August 2019 with finding of multiple diverticuli, no polyps, and is indicated for 10-year interval follow-up. She had EGD in February 2019 with finding of a 3 to 4 cm segment of proximal to mid esophagus tortuosity and some narrowing.  There were multiple small AVMs associated with this site felt likely secondary to radiation. She had repeat EGD in May 2019 with dilation to the mid proximal stricture to 20 mm with balloon. She has also had a submucosal gastric lesion first noted in 2009.  She has had EUS procedures none with a firm tissue diagnosis.  This has been serially reevaluated.  Last EGD in February 2019 showed the same lesion unchanged over 10 years.  Patient has recently been seen by ENT in July 2022/[facility] but did not have laryngoscopy done at that time.  Review of Systems. Pertinent positive and negative review of systems were noted in the above HPI section.  All other review of systems was otherwise negative.   Outpatient Encounter Medications as of 05/16/2021  Medication Sig   Accu-Chek Softclix Lancets lancets USE LANCETS 3 TIMES A DAY AS DIRECTED 30 DAYS   azaTHIOprine (IMURAN) 50 MG tablet Take 150 mg by mouth daily.   B-D UF III MINI PEN NEEDLES 31G X 5 MM MISC  2 (two) times daily. as directed   Blood Glucose Monitoring Suppl (ACCU-CHEK GUIDE) w/Device KIT See admin instructions.   co-enzyme Q-10 50 MG capsule TAKE 1 CAPSULE BY MOUTH EVERY DAY   diltiazem (CARDIZEM CD) 360 MG 24 hr capsule TAKE 1 CAPSULE BY MOUTH EVERY DAY   dofetilide (TIKOSYN) 500 MCG capsule TAKE 1 CAPSULE (500 MCG TOTAL) BY MOUTH 2 (TWO) TIMES DAILY.   ELIQUIS 5 MG TABS tablet TAKE 1 TABLET BY MOUTH TWICE A DAY   folic acid (FOLVITE) 1 MG tablet Take 1 mg by mouth daily.   furosemide (LASIX) 40 MG tablet Take  1 tablet (40 mg total) by mouth daily as needed for fluid or edema.   HUMALOG MIX 75/25 KWIKPEN (75-25) 100 UNIT/ML Kwikpen Inject 30-40 Units into the skin See admin instructions. Inject 40 units SQ in Am and 35 units SQ in PM   levothyroxine (SYNTHROID, LEVOTHROID) 50 MCG tablet Take 50 mcg by mouth daily before breakfast.    losartan (COZAAR) 25 MG tablet TAKE 1 TABLET BY MOUTH EVERY DAY   meclizine (ANTIVERT) 12.5 MG tablet Take 1 tablet (12.5 mg total) by mouth 3 (three) times daily as needed for dizziness.   metoprolol tartrate (LOPRESSOR) 25 MG tablet TAKE 1 TABLET BY MOUTH TWICE A DAY   nitroGLYCERIN (NITROSTAT) 0.4 MG SL tablet Place 1 tablet (0.4 mg total) under the tongue every 5 (five) minutes as needed for chest pain.   OZEMPIC, 0.25 OR 0.5 MG/DOSE, 2 MG/1.5ML SOPN INJECT 0.5 MG INTO THE SKIN ONCE A WEEK. (Patient taking differently: as needed.)   pravastatin (PRAVACHOL) 20 MG tablet TAKE 1 TABLET BY MOUTH EVERY DAY   spironolactone (ALDACTONE) 25 MG tablet TAKE 1 TABLET BY MOUTH EVERY DAY   No facility-administered encounter medications on file as of 05/16/2021.   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   Type 2 diabetes mellitus with stage 3 chronic kidney disease, with long-term current use of insulin ([place]) 07/17/2020   PVD (peripheral vascular disease) ([place]) 07/06/2020   Daytime sleepiness  02/14/2020   Paroxysmal atrial fibrillation ([place]) 01/25/2020   Secondary hypercoagulable state ([place]) 01/25/2020   Hypothyroidism 02/17/2019   Cellulitis of fourth toe, left 01/01/2019   Diverticulosis of colon without hemorrhage    Chronic diastolic (congestive) heart failure ([place]) 08/04/2017   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   Chronic pericarditis 06/13/2017   Unstable angina (HCC) 04/12/2017   Atrial fibrillation with RVR ([place])    Hyperlipidemia 08/07/2015   CAD S/P percutaneous coronary angioplasty 07/25/2015   Hypertension, essential    Aortic insufficiency    Stented coronary artery    Abnormal nuclear stress test 07/11/2015   Abnormal stress test 07/04/2015   Pericarditis    Controlled type 2 diabetes mellitus without complication, with long-term current use of insulin (HCC)    Pericardial effusion    Chest  pain 05/30/2015   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 ([place]) 05/23/2015   Open angle with borderline findings and low glaucoma risk in both eyes 05/23/2015   Restrictive lung disease 03/14/2015   CAD (coronary artery disease) 10/21/2014   Insomnia    Obesity    Anemia    Kidney stones    UNDIAGNOSED CARDIAC MURMURS 02/24/2009   DYSPNEA 09/20/2008   DIABETES MELLITUS, TYPE II 06/19/2007   Spontaneous pneumothorax 10/21/2005   Social History   Socioeconomic History   Marital status: Single    Spouse name: Not on file   Number of children: 0   Years of education: Not on file   Highest education level: Not on file  Occupational History   Occupation: RETIRED  Tobacco Use   Smoking status: Former    Packs/day: 0.25    Years: 5.00    Pack years: 1.25    Types: Cigarettes    Quit date: 07/22/1999    Years since quitting: 21.8   Smokeless tobacco: Never  Vaping Use   Vaping Use: Never used  Substance and Sexual Activity   Alcohol use: No    Alcohol/week: 0.0  standard drinks   Drug use: No   Sexual activity: Not Currently    Birth control/protection: Other-see comments    Comment: hysterectomy  Other Topics Concern   Not on file  Social History Narrative   Not on file   Social Determinants of Health   Financial Resource Strain: Low Risk    Difficulty of Paying Living Expenses: Not hard at all  Food Insecurity: No Food Insecurity   Worried About Charity fundraiser in the Last Year: Never true   Arboriculturist in the Last Year: Never true  Transportation Needs: No Transportation Needs   Lack of Transportation (Medical): No   Lack of Transportation (Non-Medical): No  Physical Activity: Inactive   Days of Exercise per Week: 0 days   Minutes of Exercise per Session: 0 min  Stress: No Stress Concern Present   Feeling of Stress : Not at all  Social Connections: Not on file  Intimate Partner Violence: Not on file    Ms. Leavens's family history includes Diabetes in her father, maternal grandmother, mother, paternal grandmother, sister, sister, sister, and sister; Heart disease in her father; Hypertension in her sister; Kidney disease in her sister; Pancreatic cancer in her mother.      Objective:    Vitals:   05/16/21 1327  BP: 110/60  Pulse: 84    Physical Exam Well-developed well-nourished AA female in no acute distress.  Height, Weight, 236 BMI 37.02  HEENT; nontraumatic normocephalic, EOMI, PE R LA, sclera anicteric. Oropharynx;not examined today  Neck; supple, no JVD Cardiovascular; regular rate and rhythm with S1-S2, no murmur rub or gallop Pulmonary; Clear bilaterally Abdomen; soft, obese ,nontender, nondistended, no palpable mass or hepatosplenomegaly, bowel sounds are active Rectal;not done today Skin; benign exam, no jaundice rash or appreciable lesions Extremities; no clubbing cyanosis or edema skin warm and dry Neuro/Psych; alert and oriented x4, grossly nonfocal mood and affect appropriate        Assessment &  Plan:   #31 65 year old female on chronic Eliquis with complaints of intermittent very small volume spitting up of heme that occurs with brushing her teeth which causes her to gag.  Most of the time she will bring up phlegm but has noted on  3 or 4 occasions since March 2022 to have a small amount of heme. She is also complaining of dysphagia primarily to solids but occasionally with liquids over the past couple of years.  She did have 2 EGDs in 2019, with the second EGD she had dilation with a balloon to 20 mm of the mid to proximal esophageal stricture outlined above. Patient has a very remote history of lung cancer 2001 for which she underwent resection and then chemoradiation. She has previously noted 3 to 4 cm segment in the proximal to mid esophagus with tortuosity some narrowing and an area within this segment showing multiple small AVMs felt to be radiation related.  Etiology of the small volume spitting up heme is not clear, certainly feasible that she could have a small amount of bleeding from this segment of esophageal AVMs in setting of chronic Eliquis. Rule out other esophageal mucosal abnormality.  Rule out pharyngeal source.  With dysphagia rule out recurrent/persistent proximal to mid esophageal stricture in the area of previous radiation changes.  #2 chronic atrial fibrillation 3.  Coronary artery disease with stable two-vessel disease on recent cath April 2022.  She is status post 2 previously placed stents (2016) #4 history of pericarditis/chronic component-possible underlying autoimmune #5 peripheral vascular disease 6.  Adult onset diabetes mellitus 7.  Hypertension 8.  Diverticulosis 9.  Colon cancer screening-up-to-date with negative colonoscopy August 2019 with exception of diverticulosis #10 chronic anemia #11 chronic kidney disease  Plan; Patient will be scheduled for EGD with probable esophageal dilation with Dr. Ardis Hughs.  Procedure was discussed in detail with the  patient including indications risk and benefits and she is agreeable to proceed. Will hold Eliquis for 48 hours prior to procedure.  We will communicate with her cardiologist Dr. Daneen Schick to assure this is reasonable for this patient. CBC today  If EGD is unrevealing as to source of the very small volume spitting up of heme will need referral back to ENT for laryngoscopy.   Isreal Moline Genia Harold PA-C 05/16/2021   Cc: Glendale Chard, MD

## 2021-05-16 NOTE — Patient Instructions (Signed)
If you are age 63 or older, your body mass index should be between 23-30. Your Body mass index is 37.02 kg/m. If this is out of the aforementioned range listed, please consider follow up with your Primary Care Provider. __________________________________________________________  The [facility] providers would like to encourage you to use [facility] to communicate with providers for non-urgent requests or questions.  Due to long hold times on the telephone, sending your provider a message by [facility] may be a faster and more efficient way to get a response.  Please allow 48 business hours for a response.  Please remember that this is for non-urgent requests.   Your provider has requested that you go to the basement level for lab work before leaving today. Press "B" on the elevator. The lab is located at the first door on the left as you exit the elevator.  You have been scheduled for an endoscopy. Please follow written instructions given to you at your visit today. If you use inhalers (even only as needed), please bring them with you on the day of your procedure.  You will be contacted by our office prior to your procedure for directions on holding your Eliquis.  If you do not hear from our office 1 week prior to your scheduled procedure, please call [phone] to discuss.   Follow up pending the results of your Endoscopy or as needed.  Thank you for entrusting me with your care and choosing [facility].  Amy Esterwood, PA-C

## 2021-05-17 DIAGNOSIS — M1711 Unilateral primary osteoarthritis, right knee: Secondary | ICD-10-CM | POA: Diagnosis not present

## 2021-05-17 DIAGNOSIS — M7701 Medial epicondylitis, right elbow: Secondary | ICD-10-CM | POA: Diagnosis not present

## 2021-05-28 NOTE — Progress Notes (Signed)
I agree with the above note, plan 

## 2021-05-29 ENCOUNTER — Telehealth: Payer: Self-pay | Admitting: Cardiology

## 2021-05-29 NOTE — Telephone Encounter (Signed)
Spoke with Dr. Tamala Julian and he said with LDL being at target and history of intolerances, would not recommend any changes to statin therapy.  Spoke with [name] at [facility] and made her aware.

## 2021-05-29 NOTE — Telephone Encounter (Signed)
Spoke with Anderson Malta, pharmacist with [facility] who states that during their review of the patient's medications they noticed that the patient was taking pravastatin 20 mg daily. She states that due to the patient's history and cardiovascular risk that they recommend a moderate to high intensity statin. Requesting that provider review recommendations and let them know if changes need to be made.

## 2021-05-29 NOTE — Telephone Encounter (Signed)
   Pt c/o medication issue:  1. Name of Medication:   pravastatin (PRAVACHOL) 20 MG tablet    2. How are you currently taking this medication (dosage and times per day)? TAKE 1 TABLET BY MOUTH EVERY DAY  3. Are you having a reaction (difficulty breathing--STAT)?   4. What is your medication issue? Marcene Brawn PharmD with [facility] calling, asking if the pt can be on moderate to high intensity statin, she said they faxed a request to Dr. [name] as well, if the doctor recommends this is enough statin therapy to send them an icd - 10 code

## 2021-06-05 ENCOUNTER — Ambulatory Visit (AMBULATORY_SURGERY_CENTER): Payer: Medicare PPO | Admitting: Gastroenterology

## 2021-06-05 ENCOUNTER — Other Ambulatory Visit: Payer: Self-pay | Admitting: Cardiology

## 2021-06-05 ENCOUNTER — Encounter: Payer: Self-pay | Admitting: Gastroenterology

## 2021-06-05 VITALS — BP 123/70 | HR 73 | Temp 97.8°F | Resp 16 | Ht 67.0 in | Wt 236.0 lb

## 2021-06-05 DIAGNOSIS — R1310 Dysphagia, unspecified: Secondary | ICD-10-CM | POA: Diagnosis not present

## 2021-06-05 DIAGNOSIS — R042 Hemoptysis: Secondary | ICD-10-CM

## 2021-06-05 HISTORY — PX: UPPER GASTROINTESTINAL ENDOSCOPY: SHX188

## 2021-06-05 MED ORDER — SODIUM CHLORIDE 0.9 % IV SOLN: 500.0000 mL | Freq: Once | INTRAVENOUS | Status: DC

## 2021-06-05 MED ORDER — PRAVASTATIN SODIUM 20 MG PO TABS: 20.0000 mg | ORAL_TABLET | Freq: Every day | ORAL | 1 refills | Status: DC

## 2021-06-05 NOTE — Patient Instructions (Signed)
Read all of the handouts given to you by your recovery room nurse.  You may resume your Eliquis tomorrow.  YOU HAD AN ENDOSCOPIC PROCEDURE TODAY AT [facility]:   Refer to the procedure report that was given to you for any specific questions about what was found during the examination.  If the procedure report does not answer your questions, please call your gastroenterologist to clarify.  If you requested that your care partner not be given the details of your procedure findings, then the procedure report has been included in a sealed envelope for you to review at your convenience later.  YOU SHOULD EXPECT: Some feelings of bloating in the abdomen. Passage of more gas than usual.  Walking can help get rid of the air that was put into your GI tract during the procedure and reduce the bloating. If you had a lower endoscopy (such as a colonoscopy or flexible sigmoidoscopy) you may notice spotting of blood in your stool or on the toilet paper. If you underwent a bowel prep for your procedure, you may not have a normal bowel movement for a few days.  Please Note:  You might notice some irritation and congestion in your nose or some drainage.  This is from the oxygen used during your procedure.  There is no need for concern and it should clear up in a day or so.  SYMPTOMS TO REPORT IMMEDIATELY:   Following upper endoscopy (EGD)  Vomiting of blood or coffee ground material  New chest pain or pain under the shoulder blades  Painful or persistently difficult swallowing  New shortness of breath  Fever of 100F or higher  Black, tarry-looking stools  For urgent or emergent issues, a gastroenterologist can be reached at any hour by calling [phone]. Do not use MyChart messaging for urgent concerns.    DIET:  We do recommend a small meal at first preferably soft for first meal. Be sure to take small bites and chew your food well.you may proceed to your regular diet.  Drink plenty of  fluids but you should avoid alcoholic beverages for 24 hours.  ACTIVITY:  You should plan to take it easy for the rest of today and you should NOT DRIVE or use heavy machinery until tomorrow (because of the sedation medicines used during the test).    FOLLOW UP: Our staff will call the number listed on your records 48-72 hours following your procedure to check on you and address any questions or concerns that you may have regarding the information given to you following your procedure. If we do not reach you, we will leave a message.  We will attempt to reach you two times.  During this call, we will ask if you have developed any symptoms of COVID 19. If you develop any symptoms (ie: fever, flu-like symptoms, shortness of breath, cough etc.) before then, please call [phone].  If you test positive for Covid 19 in the 2 weeks post procedure, please call and report this information to [place].      SIGNATURES/CONFIDENTIALITY: You and/or your care partner have signed paperwork which will be entered into your electronic medical record.  These signatures attest to the fact that that the information above on your After Visit Summary has been reviewed and is understood.  Full responsibility of the confidentiality of this discharge information lies with you and/or your care-partner.

## 2021-06-05 NOTE — Progress Notes (Signed)
HPI: This is a woman with dysphagia, minor hematemsis, on blood thinners   ROS: complete GI ROS as described in HPI, all other review negative.  Constitutional:  No unintentional weight loss   Past Medical History:  Diagnosis Date   Allergy    Anemia    Anemia    Aortic insufficiency    a. Mod by echo 05/2015.   Arthritis    "knees" (07/11/2015)   Atrial fibrillation (HCC)    On Eliquis   Carcinoma of hilus of lung ([place]) 01/20/2012   IIIB NSCL  Right hilar mass compressing esophagus/presenting with dysphagia Rx Surgery/RT/chemo dx January 2353   Complication of anesthesia    "I had a hard time waking me up w/one of my shoulder surgeries"   Coronary artery disease    a. Abnormal stress test -> LHc 06/2015 s/p DES to mCX and mLAD, residual D2 disease treated medially.   Diverticulitis    Elevated blood pressure    Erythema nodosum    Heart murmur    Insomnia    lung ca dx'd 10/1999   chemo/xrt comp 05/29/2000   Myalgia    Nephrolithiasis 2015   Obesity    Pericardial effusion    a. 05/2015 Echo: EF 55-60%, no rwma, Gr 1 DD, no effusion - but an effusion was seen on CT which was felt to be increased in size compared to 12/2014 - pericardium also thickened.    Pericarditis    a. Dx 05/2015.   Pneumothorax, spontaneous, tension 01/20/2012   October, 2010   Pulmonary fibrosis ([place]) 01/20/2012   Due to previous surgery and chest radiation for lung cancer   Restrictive lung disease    Type II diabetes mellitus [facility])     Past Surgical History:  Procedure Laterality Date   ABDOMINAL HYSTERECTOMY  10/21/2006   APPENDECTOMY  1969?   CARDIAC CATHETERIZATION N/A 07/11/2015   Procedure: Left Heart Cath and Coronary Angiography;  Surgeon: Jettie Booze, MD;  Location: [place] [facility];  Service: Cardiovascular;  Laterality: N/A;   CARDIAC CATHETERIZATION  07/11/2015   Procedure: Coronary Stent Intervention;  Surgeon: Jettie Booze, MD;  Location: [place] [facility];  Service:  Cardiovascular;;   CARPAL TUNNEL RELEASE Bilateral 1998-2005?   right-left   CARPAL TUNNEL RELEASE Left 2009?   "for the 2nd time"   COLONOSCOPY     COLONOSCOPY WITH PROPOFOL N/A 05/28/2018   Procedure: COLONOSCOPY WITH PROPOFOL;  Surgeon: Milus Banister, MD;  Location: [facility];  Service: Endoscopy;  Laterality: N/A;   CORONARY ANGIOPLASTY     CYSTOSCOPY W/ STONE MANIPULATION  10/22/2011   ESOPHAGOGASTRODUODENOSCOPY (EGD) WITH ESOPHAGEAL DILATION  10/21/2010   FOOT SURGERY Bilateral 06/22/1979   Callous removed    INNER EAR SURGERY Right ~ 2009   KNEE ARTHROPLASTY Right 10/21/1989   KNEE ARTHROSCOPY  ~ 2000   LATERAL RELEASE   LEFT HEART CATH AND CORONARY ANGIOGRAPHY N/A 12/18/2016   Procedure: Left Heart Cath and Coronary Angiography;  Surgeon: Burnell Blanks, MD;  Location: [place] [facility];  Service: Cardiovascular;  Laterality: N/A;   LEFT HEART CATH AND CORONARY ANGIOGRAPHY N/A 02/01/2021   Procedure: LEFT HEART CATH AND CORONARY ANGIOGRAPHY;  Surgeon: Leonie Man, MD;  Location: [place] [facility];  Service: Cardiovascular;  Laterality: N/A;   LUNG CANCER SURGERY Left 10/22/1999   "small cell"   LUNG SURGERY Right 10/21/2005   "reinflated it"   PULMONARY EMBOLISM SURGERY  10/21/2005   LUNG  COLLAPSE   RIGHT AND LEFT HEART CATH N/A 06/25/2017   Procedure: RIGHT AND LEFT HEART CATH;  Surgeon: Sherren Mocha, MD;  Location: [place] [facility];  Service: Cardiovascular;  Laterality: N/A;   SHOULDER ADHESION RELEASE Right ~ 2004   SHOULDER ARTHROSCOPY W/ ROTATOR CUFF REPAIR Right ~ 2003   UPPER GASTROINTESTINAL ENDOSCOPY  06/05/2021    Current Outpatient Medications  Medication Sig Dispense Refill   Accu-Chek Softclix Lancets lancets USE LANCETS 3 TIMES A DAY AS DIRECTED 30 DAYS     azaTHIOprine (IMURAN) 50 MG tablet Take 150 mg by mouth daily.     B-D UF III MINI PEN NEEDLES 31G X 5 MM MISC 2 (two) times daily. as directed     Blood Glucose Monitoring  Suppl (ACCU-CHEK GUIDE) w/Device KIT See admin instructions.     co-enzyme Q-10 50 MG capsule TAKE 1 CAPSULE BY MOUTH EVERY DAY 30 capsule 7   diltiazem (CARDIZEM CD) 360 MG 24 hr capsule TAKE 1 CAPSULE BY MOUTH EVERY DAY 90 capsule 1   dofetilide (TIKOSYN) 500 MCG capsule TAKE 1 CAPSULE (500 MCG TOTAL) BY MOUTH 2 (TWO) TIMES DAILY. 180 capsule 1   HUMALOG MIX 75/25 KWIKPEN (75-25) 100 UNIT/ML Kwikpen Inject 30-40 Units into the skin See admin instructions. Inject 40 units SQ in Am and 35 units SQ in PM  6   levothyroxine (SYNTHROID, LEVOTHROID) 50 MCG tablet Take 50 mcg by mouth daily before breakfast.   6   losartan (COZAAR) 25 MG tablet TAKE 1 TABLET BY MOUTH EVERY DAY 90 tablet 1   metoprolol tartrate (LOPRESSOR) 25 MG tablet TAKE 1 TABLET BY MOUTH TWICE A DAY 180 tablet 3   pravastatin (PRAVACHOL) 20 MG tablet TAKE 1 TABLET BY MOUTH EVERY DAY 90 tablet 2   spironolactone (ALDACTONE) 25 MG tablet TAKE 1 TABLET BY MOUTH EVERY DAY 90 tablet 2   ELIQUIS 5 MG TABS tablet TAKE 1 TABLET BY MOUTH TWICE A DAY 219 tablet 1   folic acid (FOLVITE) 1 MG tablet Take 1 mg by mouth daily. (Patient not taking: Reported on 06/05/2021)     furosemide (LASIX) 40 MG tablet Take 1 tablet (40 mg total) by mouth daily as needed for fluid or edema. 30 tablet 6   meclizine (ANTIVERT) 12.5 MG tablet Take 1 tablet (12.5 mg total) by mouth 3 (three) times daily as needed for dizziness. 30 tablet 0   nitroGLYCERIN (NITROSTAT) 0.4 MG SL tablet Place 1 tablet (0.4 mg total) under the tongue every 5 (five) minutes as needed for chest pain. 25 tablet 3   OZEMPIC, 0.25 OR 0.5 MG/DOSE, 2 MG/1.5ML SOPN INJECT 0.5 MG INTO THE SKIN ONCE A WEEK. (Patient taking differently: as needed.) 3 pen 3   Current Facility-Administered Medications  Medication Dose Route Frequency Provider Last Rate Last Admin   0.9 %  sodium chloride infusion  500 mL Intravenous Once Milus Banister, MD        Allergies as of 06/05/2021 - Review Complete  06/05/2021  Allergen Reaction Noted   Hydrocodone Hives and Itching    Hydrocodone-acetaminophen Hives 07/23/2017   Morphine and related Other (See Comments) 11/09/2011   Canagliflozin  09/05/2020   Metformin hcl  09/05/2020   Rosuvastatin Other (See Comments) 01/19/2018   Lisinopril Cough 07/25/2015   Nsaids Other (See Comments) 11/09/2011    Family History  Problem Relation Age of Onset   Heart disease Father    Diabetes Father    Pancreatic cancer Mother  Diabetes Mother    Hypertension Sister        x 3   Diabetes Sister        x 4   Diabetes Maternal Grandmother    Diabetes Paternal Grandmother    Diabetes Sister    Diabetes Sister    Diabetes Sister    Kidney disease Sister    Colon cancer Neg Hx    Colon polyps Neg Hx    Rectal cancer Neg Hx    Stomach cancer Neg Hx    Esophageal cancer Neg Hx    Breast cancer Neg Hx     Social History   Socioeconomic History   Marital status: Single    Spouse name: Not on file   Number of children: 0   Years of education: Not on file   Highest education level: Not on file  Occupational History   Occupation: RETIRED  Tobacco Use   Smoking status: Former    Packs/day: 0.25    Years: 5.00    Pack years: 1.25    Types: Cigarettes    Quit date: 07/22/1999    Years since quitting: 21.8   Smokeless tobacco: Never  Vaping Use   Vaping Use: Never used  Substance and Sexual Activity   Alcohol use: No    Alcohol/week: 0.0 standard drinks   Drug use: No   Sexual activity: Not Currently    Birth control/protection: Other-see comments    Comment: hysterectomy  Other Topics Concern   Not on file  Social History Narrative   Not on file   Social Determinants of Health   Financial Resource Strain: Low Risk    Difficulty of Paying Living Expenses: Not hard at all  Food Insecurity: No Food Insecurity   Worried About Charity fundraiser in the Last Year: Never true   Ran Out of Food in the Last Year: Never true   Transportation Needs: No Transportation Needs   Lack of Transportation (Medical): No   Lack of Transportation (Non-Medical): No  Physical Activity: Inactive   Days of Exercise per Week: 0 days   Minutes of Exercise per Session: 0 min  Stress: No Stress Concern Present   Feeling of Stress : Not at all  Social Connections: Not on file  Intimate Partner Violence: Not on file     Physical Exam: BP (!) 102/58   Pulse 68   Temp 97.8 F (36.6 C) (Temporal)   Ht '5\' 7"'  (1.702 m)   Wt 236 lb (107 kg)   SpO2 100%   BMI 36.96 kg/m  Constitutional: generally well-appearing Psychiatric: alert and oriented x3 Lungs: CTA bilaterally Heart: no MCR  Assessment and plan: 65 y.o. female with dysphagai, minor hematemesis, on blood thinner (held 2 days)  For egd today  Care is appropriate for the ambulatory setting.  Owens Loffler, MD [facility] 06/05/2021, 9:36 AM

## 2021-06-05 NOTE — Progress Notes (Signed)
Pt in recovery with monitors in place, VSS. Report given to receiving RN. Bite guard was placed with pt awake to ensure comfort. No dental or soft tissue damage noted. 

## 2021-06-05 NOTE — Progress Notes (Signed)
Called to room to assist during endoscopic procedure.  Patient ID and intended procedure confirmed with present staff. Received instructions for my participation in the procedure from the performing physician.  

## 2021-06-05 NOTE — Op Note (Signed)
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 The patient's tolerance of                            previous anesthesia was also reviewed. The risks                            and benefits of the procedure and the sedation                            options and risks were discussed with the patient.                            All questions were answered, and informed consent                            was obtained. Prior Anticoagulants: The patient has                            taken Eliquis (apixaban), last dose was 3 days                            prior to procedure. ASA Grade Assessment: III - A                            patient with severe systemic disease. After  reviewing the risks and benefits, the patient was                            deemed in satisfactory condition to undergo the                            procedure.                           After obtaining informed consent, the endoscope was                            passed under direct vision. Throughout the                            procedure, the patient's blood pressure, pulse, and                             oxygen saturations were monitored continuously. The                            GIF [id number] #[id number] was introduced through the                            mouth, and advanced to the second part of duodenum.                            The upper GI endoscopy was accomplished without                            difficulty. The patient tolerated the procedure                            well. Scope In: Scope Out: Findings:                 One benign-appearing, intrinsic mild stenosis was                            found in the proximal esophagus (around 22cm from                            in the incisors). The esophageal lumen in this                            region is tortuous and a bit narrowed, the mucosa                            is normal except for a few small AVMs. A TTS                            dilator was passed through the scope. Dilation with  an 18-19-20 mm balloon dilator was performed to 20                            mm. There was typical superficial mucosal                            disruption and minor self limited oozing following                            dilation.                           Polypoid lesion in proximal stomach again noted,                            unchanged vs. 13 years ago.                           The exam was otherwise without abnormality. Complications:            No immediate complications. Estimated blood loss:                            None. Estimated Blood Loss:     Estimated blood loss: none. Impression:               - One benign-appearing, intrinsic mild stenosis was                            found in the proximal esophagus (around 22cm from                            in the incisors). The esophageal lumen in this                            region is tortuous and a bit narrowed, the mucosa                            is normal except for a few small AVMs. A TTS                             dilator was passed through the scope. Dilation with                            an 18-19-20 mm balloon dilator was performed to 20                            mm. I still suspect this to be related to remote                            XRT for lung cancer.                           - Polypoid lesion in proximal stomach again noted,  unchanged vs. 13 years ago.Benign-appearing                            esophageal stenosis. Dilated.                           - The examination was otherwise normal. Recommendation:           - Patient has a contact number available for                            emergencies. The signs and symptoms of potential                            delayed complications were discussed with the                            patient. Return to normal activities tomorrow.                            Written discharge instructions were provided to the                            patient.                           - Resume previous diet. Eat slowly, take small                            bites, chew your food very well.                           - Continue present medications. OK to resume your                            eliquis tomorrow.                           - Dr. Ardis Hughs' office to refer you to ENT for good                            mouth, throat examination since your minor bleeding                            tends to happens with phlegm, after clearing your                            throat. Milus Banister, MD 06/05/2021 9:58:17 AM This report has been signed electronically.

## 2021-06-05 NOTE — Progress Notes (Signed)
Pt's states no medical or surgical changes since previsit or office visit.  ° °Vitals CW °

## 2021-06-06 ENCOUNTER — Ambulatory Visit (HOSPITAL_BASED_OUTPATIENT_CLINIC_OR_DEPARTMENT_OTHER): Payer: Medicare PPO | Admitting: Cardiology

## 2021-06-07 ENCOUNTER — Telehealth: Payer: Self-pay

## 2021-06-07 ENCOUNTER — Other Ambulatory Visit: Payer: Self-pay

## 2021-06-07 ENCOUNTER — Telehealth: Payer: Self-pay | Admitting: *Deleted

## 2021-06-07 DIAGNOSIS — R6889 Other general symptoms and signs: Secondary | ICD-10-CM

## 2021-06-07 DIAGNOSIS — R0989 Other specified symptoms and signs involving the circulatory and respiratory systems: Secondary | ICD-10-CM

## 2021-06-07 DIAGNOSIS — R042 Hemoptysis: Secondary | ICD-10-CM

## 2021-06-07 NOTE — Telephone Encounter (Signed)
Dr Redmond Baseman office was called and the pt has been scheduled with Dr Redmond Baseman on 8/31 at 140 pm Message left on the pt home phone with appt information.

## 2021-06-07 NOTE — Telephone Encounter (Signed)
Pt called to inform that she already has an ENT Dr. Leodis Sias. Pt states that she cannot get an appt until 3 months so she wants to know if we can help her get one sooner. She stated that Dr. Ardis Hughs told her that we could.

## 2021-06-07 NOTE — Telephone Encounter (Signed)
  Follow up Call-  Call back number 06/05/2021  Post procedure Call Back phone  # [phone]  Permission to leave phone message Yes  Some recent data might be hidden     Patient questions:  Do you have a fever, pain , or abdominal swelling? No. Pain Score  0 *  Have you tolerated food without any problems? Yes.    Have you been able to return to your normal activities? Yes.    Do you have any questions about your discharge instructions: Diet   No. Medications  No. Follow up visit  No.  Do you have questions or concerns about your Care? No.  Actions: * If pain score is 4 or above: No action needed, pain <4.Have you developed a fever since your procedure? no  2.   Have you had an respiratory symptoms (SOB or cough) since your procedure? no  3.   Have you tested positive for COVID 19 since your procedure no  4.   Have you had any family members/close contacts diagnosed with the COVID 19 since your procedure?  no   If yes to any of these questions please route to Joylene John, RN and Joella Prince, RN

## 2021-06-07 NOTE — Telephone Encounter (Signed)
Referral to ENT Dr Lucia Gaskins has been made.  Records faxed.

## 2021-06-12 ENCOUNTER — Ambulatory Visit (HOSPITAL_BASED_OUTPATIENT_CLINIC_OR_DEPARTMENT_OTHER): Payer: Medicare PPO | Attending: Interventional Cardiology | Admitting: Cardiology

## 2021-06-12 ENCOUNTER — Other Ambulatory Visit: Payer: Self-pay

## 2021-06-12 DIAGNOSIS — R0683 Snoring: Secondary | ICD-10-CM

## 2021-06-12 DIAGNOSIS — I4819 Other persistent atrial fibrillation: Secondary | ICD-10-CM | POA: Diagnosis not present

## 2021-06-12 DIAGNOSIS — G4733 Obstructive sleep apnea (adult) (pediatric): Secondary | ICD-10-CM

## 2021-06-14 ENCOUNTER — Telehealth: Payer: Self-pay

## 2021-06-14 ENCOUNTER — Encounter (HOSPITAL_COMMUNITY): Payer: Self-pay

## 2021-06-14 ENCOUNTER — Other Ambulatory Visit: Payer: Self-pay

## 2021-06-14 ENCOUNTER — Emergency Department (HOSPITAL_COMMUNITY): Payer: Medicare PPO

## 2021-06-14 ENCOUNTER — Emergency Department (HOSPITAL_COMMUNITY)
Admission: EM | Admit: 2021-06-14 | Discharge: 2021-06-14 | Disposition: A | Discharge status: Home / Self Care | Payer: Medicare PPO | Attending: Emergency Medicine | Admitting: Emergency Medicine

## 2021-06-14 DIAGNOSIS — I4891 Unspecified atrial fibrillation: Secondary | ICD-10-CM | POA: Diagnosis not present

## 2021-06-14 DIAGNOSIS — Z87891 Personal history of nicotine dependence: Secondary | ICD-10-CM | POA: Insufficient documentation

## 2021-06-14 DIAGNOSIS — Z794 Long term (current) use of insulin: Secondary | ICD-10-CM | POA: Diagnosis not present

## 2021-06-14 DIAGNOSIS — E039 Hypothyroidism, unspecified: Secondary | ICD-10-CM | POA: Insufficient documentation

## 2021-06-14 DIAGNOSIS — E1122 Type 2 diabetes mellitus with diabetic chronic kidney disease: Secondary | ICD-10-CM | POA: Insufficient documentation

## 2021-06-14 DIAGNOSIS — R079 Chest pain, unspecified: Secondary | ICD-10-CM | POA: Diagnosis present

## 2021-06-14 DIAGNOSIS — N183 Chronic kidney disease, stage 3 unspecified: Secondary | ICD-10-CM | POA: Diagnosis not present

## 2021-06-14 DIAGNOSIS — Z79899 Other long term (current) drug therapy: Secondary | ICD-10-CM | POA: Insufficient documentation

## 2021-06-14 DIAGNOSIS — I2510 Atherosclerotic heart disease of native coronary artery without angina pectoris: Secondary | ICD-10-CM | POA: Diagnosis not present

## 2021-06-14 DIAGNOSIS — R0602 Shortness of breath: Secondary | ICD-10-CM | POA: Diagnosis not present

## 2021-06-14 DIAGNOSIS — R000 Tachycardia, unspecified: Secondary | ICD-10-CM | POA: Diagnosis not present

## 2021-06-14 DIAGNOSIS — Z7901 Long term (current) use of anticoagulants: Secondary | ICD-10-CM | POA: Diagnosis not present

## 2021-06-14 LAB — CBC
HCT: 35.5 % — ABNORMAL LOW (ref 36.0–46.0)
Hemoglobin: 11.6 g/dL — ABNORMAL LOW (ref 12.0–15.0)
MCH: 32.6 pg (ref 26.0–34.0)
MCHC: 32.7 g/dL (ref 30.0–36.0)
MCV: 99.7 fL (ref 80.0–100.0)
Platelets: 285 10*3/uL (ref 150–400)
RBC: 3.56 MIL/uL — ABNORMAL LOW (ref 3.87–5.11)
RDW: 11.9 % (ref 11.5–15.5)
WBC: 7.5 10*3/uL (ref 4.0–10.5)
nRBC: 0 % (ref 0.0–0.2)

## 2021-06-14 LAB — BASIC METABOLIC PANEL
Anion gap: 7 (ref 5–15)
BUN: 24 mg/dL — ABNORMAL HIGH (ref 8–23)
CO2: 24 mmol/L (ref 22–32)
Calcium: 9.2 mg/dL (ref 8.9–10.3)
Chloride: 109 mmol/L (ref 98–111)
Creatinine, Ser: 1.07 mg/dL — ABNORMAL HIGH (ref 0.44–1.00)
GFR, Estimated: 59 mL/min — ABNORMAL LOW (ref >60)
Glucose, Bld: 109 mg/dL — ABNORMAL HIGH (ref 70–99)
Potassium: 3.7 mmol/L (ref 3.5–5.1)
Sodium: 140 mmol/L (ref 135–145)

## 2021-06-14 LAB — TROPONIN I (HIGH SENSITIVITY)
Troponin I (High Sensitivity): 10 ng/L (ref <18)
Troponin I (High Sensitivity): 11 ng/L (ref <18)

## 2021-06-14 MED ORDER — FENTANYL CITRATE PF 50 MCG/ML IJ SOSY
50.0000 ug | PREFILLED_SYRINGE | Freq: Once | INTRAMUSCULAR | Status: AC
  Administered 2021-06-14: 50 ug via INTRAVENOUS
  Filled 2021-06-14: qty 1

## 2021-06-14 NOTE — ED Provider Notes (Signed)
[facility] Provider Note   CSN: [id number] Arrival date & time: 06/14/21  0147     History Chief Complaint  Patient presents with   Irregular Heart Beat    [name] is a 65 y.o. female with history of afib on eliquis, CAD, T2DM, and chronic pericarditis who presents with 3 hours of chest tightness. Patient states she got up to use the restroom when her chest felt tight, felt her heart racing, and had some difficulty catching her breath. Pain does not radiate. She monitors her HR on her apple watch and was signaled she was in afib with rates from 130-150. She initially had a headache which has since resolved. Patient had endoscopy performed last week and held her Eliquis 2 days prior, but has resumed taking it as prescribed. She denies syncope, fever, chills, leg swelling, recent falls or recent illness.  HPI     Past Medical History:  Diagnosis Date   Allergy    Anemia    Anemia    Aortic insufficiency    a. Mod by echo 05/2015.   Arthritis    "knees" (07/11/2015)   Atrial fibrillation (HCC)    On Eliquis   Carcinoma of hilus of lung ([place]) 01/20/2012   IIIB NSCL  Right hilar mass compressing esophagus/presenting with dysphagia Rx Surgery/RT/chemo dx January 9449   Complication of anesthesia    "I had a hard time waking me up w/one of my shoulder surgeries"   Coronary artery disease    a. Abnormal stress test -> LHc 06/2015 s/p DES to mCX and mLAD, residual D2 disease treated medially.   Diverticulitis    Elevated blood pressure    Erythema nodosum    Heart murmur    Insomnia    lung ca dx'd 10/1999   chemo/xrt comp 05/29/2000   Myalgia    Nephrolithiasis 2015   Obesity    Pericardial effusion    a. 05/2015 Echo: EF 55-60%, no rwma, Gr 1 DD, no effusion - but an effusion was seen on CT which was felt to be increased in size compared to 12/2014 - pericardium also thickened.    Pericarditis    a. Dx 05/2015.   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   Type 2 diabetes mellitus with stage 3 chronic kidney disease, with long-term current use of insulin ([place]) 07/17/2020   PVD (peripheral vascular disease) ([address]) 07/06/2020   Daytime sleepiness 02/14/2020   Paroxysmal atrial fibrillation ([place]) 01/25/2020   Secondary hypercoagulable state ([place]) 01/25/2020   Hypothyroidism 02/17/2019   Cellulitis of fourth toe, left 01/01/2019   Diverticulosis of colon without hemorrhage    Chronic diastolic (congestive) heart failure ([place]) 08/04/2017   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   Chronic pericarditis 06/13/2017   Unstable angina ([place]) 04/12/2017   Atrial fibrillation with RVR ([place])    Hyperlipidemia 08/07/2015   CAD S/P percutaneous coronary angioplasty 07/25/2015   Hypertension, essential    Aortic insufficiency    Stented coronary artery    Abnormal nuclear stress test 07/11/2015   Abnormal stress  test 07/04/2015   Pericarditis    Controlled type 2 diabetes mellitus without complication, with long-term current use of insulin (HCC)    Pericardial effusion    Chest pain 05/30/2015   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   CARDIAC CATHETERIZATION N/A 07/11/2015   Procedure: Left Heart Cath and Coronary Angiography;  Surgeon: Jettie Booze, MD;  Location: [facility];  Service: Cardiovascular;  Laterality: N/A;   CARDIAC CATHETERIZATION  07/11/2015   Procedure: Coronary Stent Intervention;  Surgeon: Jettie Booze, MD;  Location: [facility];  Service: Cardiovascular;;   CARPAL TUNNEL RELEASE Bilateral 1998-2005?   right-left   CARPAL TUNNEL RELEASE Left 2009?   "for the 2nd time"   COLONOSCOPY     COLONOSCOPY WITH PROPOFOL N/A 05/28/2018   Procedure: COLONOSCOPY WITH PROPOFOL;  Surgeon: Milus Banister, MD;  Location: [facility];  Service: Endoscopy;  Laterality: N/A;   CORONARY ANGIOPLASTY     CYSTOSCOPY W/ STONE MANIPULATION  10/22/2011   ESOPHAGOGASTRODUODENOSCOPY (EGD) WITH ESOPHAGEAL DILATION  10/21/2010   FOOT SURGERY Bilateral 06/22/1979   Callous removed    INNER EAR SURGERY Right ~ 2009   KNEE ARTHROPLASTY Right 10/21/1989   KNEE ARTHROSCOPY  ~ 2000   LATERAL RELEASE   LEFT HEART CATH AND CORONARY ANGIOGRAPHY N/A 12/18/2016   Procedure: Left Heart Cath and Coronary Angiography;  Surgeon: Burnell Blanks, MD;  Location: [facility];  Service: Cardiovascular;  Laterality: N/A;   LEFT HEART CATH AND CORONARY ANGIOGRAPHY N/A 02/01/2021   Procedure: LEFT HEART CATH AND CORONARY ANGIOGRAPHY;  Surgeon: Leonie Man, MD;  Location: [facility];  Service: Cardiovascular;  Laterality: N/A;   LUNG CANCER SURGERY Left 10/22/1999   "small cell"   LUNG SURGERY Right 10/21/2005   "reinflated it"   PULMONARY EMBOLISM SURGERY  10/21/2005   LUNG COLLAPSE   RIGHT AND LEFT HEART CATH N/A 06/25/2017   Procedure: RIGHT AND LEFT HEART CATH;  Surgeon: Sherren Mocha, MD;   Location: [facility];  Service: Cardiovascular;  Laterality: N/A;   SHOULDER ADHESION RELEASE Right ~ 2004   SHOULDER ARTHROSCOPY W/ ROTATOR CUFF REPAIR Right ~ 2003   UPPER GASTROINTESTINAL ENDOSCOPY  06/05/2021     OB History   No obstetric history on file.     Family History  Problem Relation Age of Onset   Heart disease Father    Diabetes Father    Pancreatic cancer Mother    Diabetes Mother    Hypertension Sister        x 3   Diabetes Sister        x 4   Diabetes Maternal Grandmother    Diabetes Paternal Grandmother    Diabetes Sister    Diabetes Sister    Diabetes Sister    Kidney disease Sister    Colon cancer Neg  Hx    Colon polyps Neg Hx    Rectal cancer Neg Hx    Stomach cancer Neg Hx    Esophageal cancer Neg Hx    Breast cancer Neg Hx     Social History   Tobacco Use   Smoking status: Former    Packs/day: 0.25    Years: 5.00    Pack years: 1.25    Types: Cigarettes    Quit date: 07/22/1999    Years since quitting: 21.9   Smokeless tobacco: Never  Vaping Use   Vaping Use: Never used  Substance Use Topics   Alcohol use: No    Alcohol/week: 0.0 standard drinks   Drug use: No    Home Medications Prior to Admission medications   Medication Sig Start Date End Date Taking? Authorizing Provider  Accu-Chek Softclix Lancets lancets USE LANCETS 3 TIMES A DAY AS DIRECTED 30 DAYS 11/12/19   [provider]  azaTHIOprine (IMURAN) 50 MG tablet Take 150 mg by mouth daily. 07/04/20   [provider]  B-D UF III MINI PEN NEEDLES 31G X 5 MM MISC 2 (two) times daily. as directed 10/31/18   [provider]  Blood Glucose Monitoring Suppl (ACCU-CHEK GUIDE) w/Device KIT See admin instructions. 11/12/19   [provider]  co-enzyme Q-10 50 MG capsule TAKE 1 CAPSULE BY MOUTH EVERY DAY 11/30/19   Dorothy Spark, MD  diltiazem (CARDIZEM CD) 360 MG 24 hr capsule TAKE 1 CAPSULE BY MOUTH EVERY DAY 10/06/20   Camnitz, Ocie Doyne, MD   dofetilide (TIKOSYN) 500 MCG capsule TAKE 1 CAPSULE (500 MCG TOTAL) BY MOUTH 2 (TWO) TIMES DAILY. 01/17/21   Camnitz, Will Hassell Done, MD  ELIQUIS 5 MG TABS tablet TAKE 1 TABLET BY MOUTH TWICE A DAY 10/06/20   Camnitz, Ocie Doyne, MD  folic acid (FOLVITE) 1 MG tablet Take 1 mg by mouth daily. Patient not taking: Reported on 06/05/2021 12/02/19   [provider]  furosemide (LASIX) 40 MG tablet Take 1 tablet (40 mg total) by mouth daily as needed for fluid or edema. 03/15/21   Belva Crome, MD  HUMALOG MIX 75/25 KWIKPEN (75-25) 100 UNIT/ML Kwikpen Inject 30-40 Units into the skin See admin instructions. Inject 40 units SQ in Am and 35 units SQ in PM 12/22/15   [provider]  levothyroxine (SYNTHROID, LEVOTHROID) 50 MCG tablet Take 50 mcg by mouth daily before breakfast.  05/14/17   [provider]  losartan (COZAAR) 25 MG tablet TAKE 1 TABLET BY MOUTH EVERY DAY 06/05/21   Belva Crome, MD  meclizine (ANTIVERT) 12.5 MG tablet Take 1 tablet (12.5 mg total) by mouth 3 (three) times daily as needed for dizziness. 04/25/21   [place] Chard, MD  metoprolol tartrate (LOPRESSOR) 25 MG tablet TAKE 1 TABLET BY MOUTH TWICE A DAY 09/04/20   Camnitz, Ocie Doyne, MD  nitroGLYCERIN (NITROSTAT) 0.4 MG SL tablet Place 1 tablet (0.4 mg total) under the tongue every 5 (five) minutes as needed for chest pain. 01/30/21 05/16/21  Camnitz, Will Hassell Done, MD  OZEMPIC, 0.25 OR 0.5 MG/DOSE, 2 MG/1.5ML SOPN INJECT 0.5 MG INTO THE SKIN ONCE A WEEK. Patient taking differently: as needed. 03/30/19   [place] Chard, MD  pravastatin (PRAVACHOL) 20 MG tablet Take 1 tablet (20 mg total) by mouth daily. 06/05/21   Belva Crome, MD  spironolactone (ALDACTONE) 25 MG tablet TAKE 1 TABLET BY MOUTH EVERY DAY 08/02/20   Dorothy Spark, MD    Allergies  Hydrocodone, Hydrocodone-acetaminophen, Morphine and related, Canagliflozin, Metformin hcl, Rosuvastatin, Lisinopril, and Nsaids  Review of Systems   Review of  Systems  Constitutional:  Negative for chills and fever.  Eyes:  Negative for visual disturbance.  Respiratory:  Positive for shortness of breath.   Cardiovascular:  Positive for chest pain and palpitations. Negative for leg swelling.  Gastrointestinal:  Negative for abdominal pain.  Neurological:  Positive for light-headedness and headaches. Negative for syncope.  All other systems reviewed and are negative.  Physical Exam Updated Vital Signs BP (!) 119/54   Pulse 72   Temp 98 F (36.7 C)   Resp 18   SpO2 98%   Physical Exam Vitals and nursing note reviewed.  Constitutional:      Appearance: Normal appearance.  HENT:     Head: Normocephalic and atraumatic.  Eyes:     Conjunctiva/sclera: Conjunctivae normal.  Cardiovascular:     Rate and Rhythm: Normal rate and regular rhythm.     Heart sounds: Murmur heard.     Comments: Systolic ejection murmur Pulmonary:     Effort: Pulmonary effort is normal. No respiratory distress.     Breath sounds: Normal breath sounds.  Abdominal:     General: There is no distension.     Palpations: Abdomen is soft.     Tenderness: There is no abdominal tenderness.  Skin:    General: Skin is warm and dry.  Neurological:     General: No focal deficit present.     Mental Status: She is alert and oriented to person, place, and time.     Comments: Neuro: Speech is clear, able to follow commands. CN III-XII intact grossly intact. PERRLA. EOMI. Sensation intact throughout. Str 5/5 all extremities.    ED Results / Procedures / Treatments   Labs (all labs ordered are listed, but only abnormal results are displayed) Labs Reviewed  BASIC METABOLIC PANEL - Abnormal; Notable for the following components:      Result Value   Glucose, Bld 109 (*)    BUN 24 (*)    Creatinine, Ser 1.07 (*)    GFR, Estimated 59 (*)    All other components within normal limits  CBC - Abnormal; Notable for the following components:   RBC 3.56 (*)    Hemoglobin 11.6 (*)     HCT 35.5 (*)    All other components within normal limits  TROPONIN I (HIGH SENSITIVITY)  TROPONIN I (HIGH SENSITIVITY)    EKG EKG Interpretation  Date/Time:  Thursday June 14 2021 01:56:18 EDT Ventricular Rate:  150 PR Interval:    QRS Duration: 68 QT Interval:  286 QTC Calculation: 451 R Axis:   57 Text Interpretation: Atrial fibrillation with rapid ventricular response Nonspecific ST abnormality Abnormal ECG Confirmed by Gerlene Fee [phone]) on 06/14/2021 4:07:25 AM  Radiology DG Chest 2 View  Result Date: 06/14/2021 CLINICAL DATA:  Shortness of breath and tachycardia, initial encounter EXAM: CHEST - 2 VIEW COMPARISON:  01/23/2020 FINDINGS: Cardiac shadow is at the upper limits of normal in size and stable. Aortic calcifications are noted as well as findings of prior coronary stenting. Chronic elevation of left hemidiaphragm is noted. No focal infiltrate or sizable effusion is noted. No bony abnormality is noted. Post radiation changes are noted in the left apex stable from prior study. IMPRESSION: No acute abnormality noted. Chronic changes as described above. Electronically Signed   By: Inez Catalina M.D.   On: 06/14/2021 02:55    Procedures Procedures  Medications Ordered in ED Medications  fentaNYL (SUBLIMAZE) injection 50 mcg (50 mcg Intravenous Given 06/14/21 0302)    ED Course  I have reviewed the triage vital signs and the nursing notes.  Pertinent labs & imaging results that were available during my care of the patient were reviewed by me and considered in my medical decision making (see chart for details).    MDM Rules/Calculators/A&P                           Patient is 65 y/o female with history of afib on eliquis and dofetilide, CAD, T2DM, and chronic pericarditis who presents with 3 hours of chest tightness, shortness of breath, and headache. Held Eliquis for 2 days last week prior to endoscopy, resumed as prescribed on 8/16.   CBC Hgb 11.6 stable from  prior, BMP Cr 1.07 stable from prior, Initial troponin 10, repeat 11 which is stable. EKG at 0156 shows atrial fibrillation. CXR no acute findings, chronic aortic calcifications and findings of prior stenting. On my exam patient is in sinus rhythm. One additional fleeting episode at 0300 lasting a few minutes; patient, again, self converted again.   Patient's cardiac workup was reassuring. I contacted her cardiologist to make him aware and the need for follow up. Patient can discharge to home. Discussed returning for any additional episodes.  Final Clinical Impression(s) / ED Diagnoses Final diagnoses:  Atrial fibrillation, unspecified type [facility])    Rx / DC Orders ED Discharge Orders     None        Kateri Plummer, PA-C 06/14/21 0719    Maudie Flakes, MD 06/14/21 0730

## 2021-06-14 NOTE — Discharge Instructions (Addendum)
Your cardiac workup today was reassuring. I sent a message to your cardiologist in hopes to expedite a follow up appointment for you. Please return to the ED for any additional episodes.

## 2021-06-14 NOTE — Telephone Encounter (Signed)
Called & left pt vm. Get an ED apt scheduled.

## 2021-06-14 NOTE — ED Triage Notes (Signed)
Pt states she had got up to use restroom, felt her heart racing, states she had some chest pain which is what she usually feels like with afib.  Pt alert and oriented in triage, has shortness of breath, denies nausea or vomiting.

## 2021-06-15 ENCOUNTER — Encounter: Payer: Self-pay | Admitting: Student

## 2021-06-15 ENCOUNTER — Ambulatory Visit: Payer: Medicare PPO | Admitting: Student

## 2021-06-15 VITALS — BP 112/60 | HR 114 | Ht 67.0 in | Wt 237.0 lb

## 2021-06-15 DIAGNOSIS — I300 Acute nonspecific idiopathic pericarditis: Secondary | ICD-10-CM

## 2021-06-15 DIAGNOSIS — I5032 Chronic diastolic (congestive) heart failure: Secondary | ICD-10-CM | POA: Diagnosis not present

## 2021-06-15 DIAGNOSIS — I4819 Other persistent atrial fibrillation: Secondary | ICD-10-CM

## 2021-06-15 MED ORDER — METOPROLOL SUCCINATE ER 50 MG PO TB24: 75.0000 mg | ORAL_TABLET | Freq: Every day | ORAL | 3 refills | Status: DC

## 2021-06-15 NOTE — Patient Instructions (Signed)
Medication Instructions:  Your physician has recommended you make the following change in your medication:   STOP: Metoprolol Tartrate START: Metoprolol Succinate 75mg  daily at bedtime  *If you need a refill on your cardiac medications before your next appointment, please call your pharmacy*   Lab Work: None If you have labs (blood work) drawn today and your tests are completely normal, you will receive your results only by: [place] (if you have MyChart) OR A paper copy in the mail If you have any lab test that is abnormal or we need to change your treatment, we will call you to review the results.   Follow-Up: At [facility], you and your health needs are our priority.  As part of our continuing mission to provide you with exceptional heart care, we have created designated Provider Care Teams.  These Care Teams include your primary Cardiologist (physician) and Advanced Practice Providers (APPs -  Physician Assistants and Nurse Practitioners) who all work together to provide you with the care you need, when you need it.  We recommend signing up for the patient portal called "MyChart".  Sign up information is provided on this After Visit Summary.  MyChart is used to connect with patients for Virtual Visits (Telemedicine).  Patients are able to view lab/test results, encounter notes, upcoming appointments, etc.  Non-urgent messages can be sent to your provider as well.   To learn more about what you can do with MyChart, go to [email or web address].    Your next appointment:   6 months with Dr Curt Bears 2-3 weeks with General Cardiologist

## 2021-06-15 NOTE — Progress Notes (Signed)
PCP:  Glendale Chard, MD Primary Cardiologist: Ena Dawley, MD (Inactive) Electrophysiologist: Will Meredith Leeds, MD   [name] is a 65 y.o. female seen today for Will Meredith Leeds, MD for acute visit due to recurrent symptomatic atrial fibrillation  and chest discomfort.   Seen in ED yesterday for same. Was sitting at home when HRs jumped up to 130s -> 140s -> 150s. She has also had mild chest discomfort for the same period. HS troponin negative in ED 8/25 but EKG did appear to show AF.    Since discharge from hospital the patient reports doing about the same. Intermittent tachy palpitations. Intermittent chest pressure similar to when she saw Dr. Curt Bears 01/2021. That time it lasted for approximately 1 week. LHC at that time with stable known disease.  She recently had EGD in setting of ? Hemoptysis and dysphagia.  She had one area of mild stenosis and several small AVMs, but otherwise unremarkable.  No syncope. No exertional chest pain. No orthopnea, PND, DOE, or edema.    Past Medical History:  Diagnosis Date   Allergy    Anemia    Anemia    Aortic insufficiency    a. Mod by echo 05/2015.   Arthritis    "knees" (07/11/2015)   Atrial fibrillation (HCC)    On Eliquis   Carcinoma of hilus of lung ([place]) 01/20/2012   IIIB NSCL  Right hilar mass compressing esophagus/presenting with dysphagia Rx Surgery/RT/chemo dx January 5053   Complication of anesthesia    "I had a hard time waking me up w/one of my shoulder surgeries"   Coronary artery disease    a. Abnormal stress test -> LHc 06/2015 s/p DES to mCX and mLAD, residual D2 disease treated medially.   Diverticulitis    Elevated blood pressure    Erythema nodosum    Heart murmur    Insomnia    lung ca dx'd 10/1999   chemo/xrt comp 05/29/2000   Myalgia    Nephrolithiasis 2015   Obesity    Pericardial effusion    a. 05/2015 Echo: EF 55-60%, no rwma, Gr 1 DD, no effusion - but an effusion was seen on CT which was felt to  be increased in size compared to 12/2014 - pericardium also thickened.    Pericarditis    a. Dx 05/2015.   Pneumothorax, spontaneous, tension 01/20/2012   October, 2010   Pulmonary fibrosis ([place]) 01/20/2012   Due to previous surgery and chest radiation for lung cancer   Restrictive lung disease    Type II diabetes mellitus [facility])    Past Surgical History:  Procedure Laterality Date   ABDOMINAL HYSTERECTOMY  10/21/2006   APPENDECTOMY  1969?   CARDIAC CATHETERIZATION N/A 07/11/2015   Procedure: Left Heart Cath and Coronary Angiography;  Surgeon: Jettie Booze, MD;  Location: [place] [facility];  Service: Cardiovascular;  Laterality: N/A;   CARDIAC CATHETERIZATION  07/11/2015   Procedure: Coronary Stent Intervention;  Surgeon: Jettie Booze, MD;  Location: [place] [facility];  Service: Cardiovascular;;   CARPAL TUNNEL RELEASE Bilateral 1998-2005?   right-left   CARPAL TUNNEL RELEASE Left 2009?   "for the 2nd time"   COLONOSCOPY     COLONOSCOPY WITH PROPOFOL N/A 05/28/2018   Procedure: COLONOSCOPY WITH PROPOFOL;  Surgeon: Milus Banister, MD;  Location: [facility];  Service: Endoscopy;  Laterality: N/A;   CORONARY ANGIOPLASTY     CYSTOSCOPY W/ STONE MANIPULATION  10/22/2011   ESOPHAGOGASTRODUODENOSCOPY (EGD) WITH ESOPHAGEAL  DILATION  10/21/2010   FOOT SURGERY Bilateral 06/22/1979   Callous removed    INNER EAR SURGERY Right ~ 2009   KNEE ARTHROPLASTY Right 10/21/1989   KNEE ARTHROSCOPY  ~ 2000   LATERAL RELEASE   LEFT HEART CATH AND CORONARY ANGIOGRAPHY N/A 12/18/2016   Procedure: Left Heart Cath and Coronary Angiography;  Surgeon: Burnell Blanks, MD;  Location: [place] [facility];  Service: Cardiovascular;  Laterality: N/A;   LEFT HEART CATH AND CORONARY ANGIOGRAPHY N/A 02/01/2021   Procedure: LEFT HEART CATH AND CORONARY ANGIOGRAPHY;  Surgeon: Leonie Man, MD;  Location: [place] [facility];  Service: Cardiovascular;  Laterality: N/A;   LUNG CANCER SURGERY Left  10/22/1999   "small cell"   LUNG SURGERY Right 10/21/2005   "reinflated it"   PULMONARY EMBOLISM SURGERY  10/21/2005   LUNG COLLAPSE   RIGHT AND LEFT HEART CATH N/A 06/25/2017   Procedure: RIGHT AND LEFT HEART CATH;  Surgeon: Sherren Mocha, MD;  Location: [place] [facility];  Service: Cardiovascular;  Laterality: N/A;   SHOULDER ADHESION RELEASE Right ~ 2004   SHOULDER ARTHROSCOPY W/ ROTATOR CUFF REPAIR Right ~ 2003   UPPER GASTROINTESTINAL ENDOSCOPY  06/05/2021    Current Outpatient Medications  Medication Sig Dispense Refill   Accu-Chek Softclix Lancets lancets USE LANCETS 3 TIMES A DAY AS DIRECTED 30 DAYS     azaTHIOprine (IMURAN) 50 MG tablet Take 150 mg by mouth daily.     B-D UF III MINI PEN NEEDLES 31G X 5 MM MISC 2 (two) times daily. as directed     Blood Glucose Monitoring Suppl (ACCU-CHEK GUIDE) w/Device KIT See admin instructions.     co-enzyme Q-10 50 MG capsule TAKE 1 CAPSULE BY MOUTH EVERY DAY 30 capsule 7   diltiazem (CARDIZEM CD) 360 MG 24 hr capsule TAKE 1 CAPSULE BY MOUTH EVERY DAY 90 capsule 1   dofetilide (TIKOSYN) 500 MCG capsule TAKE 1 CAPSULE (500 MCG TOTAL) BY MOUTH 2 (TWO) TIMES DAILY. 180 capsule 1   ELIQUIS 5 MG TABS tablet TAKE 1 TABLET BY MOUTH TWICE A DAY 078 tablet 1   folic acid (FOLVITE) 1 MG tablet Take 1 mg by mouth daily.     furosemide (LASIX) 40 MG tablet Take 1 tablet (40 mg total) by mouth daily as needed for fluid or edema. 30 tablet 6   HUMALOG MIX 75/25 KWIKPEN (75-25) 100 UNIT/ML Kwikpen Inject 30-40 Units into the skin See admin instructions. Inject 40 units SQ in Am and 35 units SQ in PM  6   levothyroxine (SYNTHROID, LEVOTHROID) 50 MCG tablet Take 50 mcg by mouth daily before breakfast.   6   losartan (COZAAR) 25 MG tablet TAKE 1 TABLET BY MOUTH EVERY DAY 90 tablet 1   meclizine (ANTIVERT) 12.5 MG tablet Take 1 tablet (12.5 mg total) by mouth 3 (three) times daily as needed for dizziness. 30 tablet 0   metoprolol tartrate (LOPRESSOR) 25 MG  tablet TAKE 1 TABLET BY MOUTH TWICE A DAY 180 tablet 3   OZEMPIC, 0.25 OR 0.5 MG/DOSE, 2 MG/1.5ML SOPN INJECT 0.5 MG INTO THE SKIN ONCE A WEEK. (Patient taking differently: as needed.) 3 pen 3   pravastatin (PRAVACHOL) 20 MG tablet Take 1 tablet (20 mg total) by mouth daily. 90 tablet 1   spironolactone (ALDACTONE) 25 MG tablet TAKE 1 TABLET BY MOUTH EVERY DAY 90 tablet 2   nitroGLYCERIN (NITROSTAT) 0.4 MG SL tablet Place 1 tablet (0.4 mg total) under the tongue every  5 (five) minutes as needed for chest pain. 25 tablet 3   No current facility-administered medications for this visit.    Allergies  Allergen Reactions   Hydrocodone Hives and Itching   Hydrocodone-Acetaminophen Hives   Morphine And Related Other (See Comments)    GI PROBLEMS   Canagliflozin     Other reaction(s): Unknown   Metformin Hcl     Other reaction(s): diarrhea   Rosuvastatin Other (See Comments)    Pt reports causes lower extremity muscle aches/cramping   Lisinopril Cough   Nsaids Other (See Comments)    GI PROBLEMS    Social History   Socioeconomic History   Marital status: Single    Spouse name: Not on file   Number of children: 0   Years of education: Not on file   Highest education level: Not on file  Occupational History   Occupation: RETIRED  Tobacco Use   Smoking status: Former    Packs/day: 0.25    Years: 5.00    Pack years: 1.25    Types: Cigarettes    Quit date: 07/22/1999    Years since quitting: 21.9   Smokeless tobacco: Never  Vaping Use   Vaping Use: Never used  Substance and Sexual Activity   Alcohol use: No    Alcohol/week: 0.0 standard drinks   Drug use: No   Sexual activity: Not Currently    Birth control/protection: Other-see comments    Comment: hysterectomy  Other Topics Concern   Not on file  Social History Narrative   Not on file   Social Determinants of Health   Financial Resource Strain: Low Risk    Difficulty of Paying Living Expenses: Not hard at all  Food  Insecurity: No Food Insecurity   Worried About Charity fundraiser in the Last Year: Never true   Ran Out of Food in the Last Year: Never true  Transportation Needs: No Transportation Needs   Lack of Transportation (Medical): No   Lack of Transportation (Non-Medical): No  Physical Activity: Inactive   Days of Exercise per Week: 0 days   Minutes of Exercise per Session: 0 min  Stress: No Stress Concern Present   Feeling of Stress : Not at all  Social Connections: Not on file  Intimate Partner Violence: Not on file     Review of Systems: All other systems reviewed and are otherwise negative except as noted above.  Physical Exam: Vitals:   06/15/21 1056  BP: 112/60  Pulse: (!) 114  SpO2: 98%  Weight: 237 lb (107.5 kg)  Height: $Remove'5\' 7"'[id number]$  (1.702 m)    GEN- The patient is well appearing, alert and oriented x 3 today.   HEENT: normocephalic, atraumatic; sclera clear, conjunctiva pink; hearing intact; oropharynx clear; neck supple, no JVP Lymph- no cervical lymphadenopathy Lungs- Clear to ausculation bilaterally, normal work of breathing.  No wheezes, rales, rhonchi Heart- Regular rate and rhythm, no murmurs, rubs or gallops, PMI not laterally displaced GI- soft, non-tender, non-distended, bowel sounds present, no hepatosplenomegaly Extremities- no clubbing, cyanosis, or edema; DP/PT/radial pulses 2+ bilaterally MS- no significant deformity or atrophy Skin- warm and dry, no rash or lesion Psych- euthymic mood, full affect Neuro- strength and sensation are intact  EKG is ordered. Shows Sinus tachycardia at 114 bpm with stable QTc on tikosyn.  Additional studies reviewed include: Previous EP office, ED notes, and Gen cards notes.   Assessment and Plan:  1. Paroxysmal AF Does appear to have had AF yesterday, today she appears  to be in NSR.  Stable QTc on tikosyn Continue diltizaem Change lopressor to Toprol and increase to 75 mg daily.  2. Recurrent pericarditis Trop negative  (in-conclusive re: pericarditis) and EKG not-suggestive today. Give alarm symptoms such as worsening chest pain, syncope, or SOB at rest as reasons to report to ED Unclear if this is contributing.  Inflammation markers are non specific and may not appear in the hyper acute phase of pericarditis. Will not check.  She had similar symptoms earlier this year that resolved gradually within a week of conservative care.  Will update Echo as well.   3. Chronic diastolic CHF Volume status stable on exam.  4. CAD No exertional component.  Stable cath 01/2021 with moderate obstructive disease and patent stents Follow  EKG with sinus tachy today and QTc stable. She states she did not miss any medications apart from holding eliquis in setting of EGD. No fever or chills, but ? If she had transient bacteremia following EGD that could have aggravated her existing issues. I have encouraged her to seek care if symptoms worsen, and will ask that her follow up with gen cards be moved up to the next few weeks.    Shirley Friar, PA-C  06/15/21 11:16 AM

## 2021-06-16 ENCOUNTER — Other Ambulatory Visit: Payer: Self-pay | Admitting: Cardiology

## 2021-06-18 ENCOUNTER — Other Ambulatory Visit: Payer: Self-pay

## 2021-06-18 DIAGNOSIS — M1712 Unilateral primary osteoarthritis, left knee: Secondary | ICD-10-CM | POA: Diagnosis not present

## 2021-06-18 DIAGNOSIS — M7702 Medial epicondylitis, left elbow: Secondary | ICD-10-CM | POA: Diagnosis not present

## 2021-06-18 MED ORDER — SPIRONOLACTONE 25 MG PO TABS: 25.0000 mg | ORAL_TABLET | Freq: Every day | ORAL | 3 refills | Status: DC

## 2021-06-18 NOTE — Telephone Encounter (Signed)
Pt last saw Oda Kilts, [place] on 06/15/21, last labs 06/14/21 Creat 1.07, age 65, weight 107.5kg, based on specified criteria pt is on appropriate dosage of Eliquis 5mg  BID for afib.  Will refill rx.

## 2021-06-20 ENCOUNTER — Telehealth: Payer: Self-pay | Admitting: Pulmonary Disease

## 2021-06-20 DIAGNOSIS — R042 Hemoptysis: Secondary | ICD-10-CM | POA: Diagnosis not present

## 2021-06-20 DIAGNOSIS — J384 Edema of larynx: Secondary | ICD-10-CM | POA: Diagnosis not present

## 2021-06-20 NOTE — Telephone Encounter (Signed)
Called and spoke with patient. She stated that she has been coughing up small amounts of blood since April 2022. She had an endoscopy done last week that turned out to be normal. She also had a laryngoscopy done today by Dr. Redmond Baseman that was normal. Per Dr. Redmond Baseman, he was concerned that the blood was coming from her lungs.   She described the blood as being bright red. She denied any chest pain, increased SOB or any signs of distress. I was able to get her an appt with TP on 06/22/21 at 1030am since RA's next appt was not until mid September and she did not want to wait that long.   She is aware that if the consistency and frequency of the blood changes, to call 911.   Nothing further needed at time of call.

## 2021-06-22 ENCOUNTER — Ambulatory Visit: Payer: Medicare PPO | Admitting: Adult Health

## 2021-06-22 ENCOUNTER — Other Ambulatory Visit: Payer: Self-pay

## 2021-06-22 ENCOUNTER — Encounter: Payer: Self-pay | Admitting: Adult Health

## 2021-06-22 VITALS — BP 134/78 | HR 68 | Temp 98.1°F | Ht 67.0 in | Wt 244.0 lb

## 2021-06-22 DIAGNOSIS — R042 Hemoptysis: Secondary | ICD-10-CM

## 2021-06-22 DIAGNOSIS — J984 Other disorders of lung: Secondary | ICD-10-CM | POA: Diagnosis not present

## 2021-06-22 NOTE — Patient Instructions (Signed)
Claritin 10mg  daily for 1 week ,then as needed  Mucinex DM liquid 2 tsp Twice daily  for 1 week then as needed  Set up Chest CT  Follow up with Dr. Elsworth Soho  in 4-6 weeks and As needed   Please contact office for sooner follow up if symptoms do not improve or worsen or seek emergency care

## 2021-06-22 NOTE — Assessment & Plan Note (Signed)
Intermittent hemoptysis for 5 months , questionable etiology .  Chest xray and Cbc unrevealing . On Eliquis ENT and GI eval unrevealing for source.  Hx of lung cancer  Needs CT chest . If unremarkable and persists , consider Bronchoscopy   Plan  Patient Instructions  Claritin 10mg  daily for 1 week ,then as needed  Mucinex DM liquid 2 tsp Twice daily  for 1 week then as needed  Set up Chest CT  Follow up with Dr. Elsworth Soho  in 4-6 weeks and As needed   Please contact office for sooner follow up if symptoms do not improve or worsen or seek emergency care

## 2021-06-22 NOTE — Progress Notes (Signed)
'@Patient'  ID: [name], female    DOB: 1956/08/12, 65 y.o.   MRN: [id number]  Chief Complaint  Patient presents with   Follow-up    Referring provider: Glendale Chard, MD  HPI: 65 year old female former smoker followed for chronic dyspnea attributed to restrictive lung disease Patient has a complicated medical history with previous right upper lobectomy for spontaneous pneumothorax/pleural abrasions in 2007 by Dr. Roxan [name] History of stage IIIb lung cancer in 2001 status post chemo and radiation and left upper lobectomy with lymph node dissection with Dr. Jearld [name]  Chronic pericarditis since 2013 with previous treatments including nonsteroidals, colchicine, steroids with previous work-up at [facility]. History of Iritis followed by Ophthalmology , on Azathioprine  Coronary artery disease, diastolic dysfunction, diabetes and A. fib on anticoagulation with Eliquis  TEST/EVENTS :  CT chest February 09, 2020 no mediastinal or hilar adenopathy, left apical consolidation similar to 2018, no notable right-sided lung mass.   PFTs 2016 FVC 55%  FEV1 52% ratio 94  Spirometry 09/2009 showed decline in FEV1 from 57 to 40%. PFT 10/2009 showed FEV1 51% & FVC 47%, DLCO was 47% but corrected for alveolar volume    01/2014 CPET >> decreased exercise tolerance, peak VO2 56% of predicted, no ventilatory limitation, no cardiac limitation-O2 pulse 73% predicted, preexercise PFTs-FVC 51%, FEV1 54%, ratio 84, MVV 65%  Home sleep study June 2021 showed very mild sleep apnea especially events noted in the supine position-CPAP not indicated at that time  06/22/2021 Follow up: Dyspnea , Restrictive Lung Disease  Patient returns for follow up . Says over last 5 months has noticed blood tinged mucus when she clears throat , gets up clear /white mucus mixed with blood. On average around 2 x week. Worse when she brushes her teeth-with gagging . [name] No weight loss. Eating well. No vomiting . No cough or  congestion . Has post nasal drainage in throat , sometimes when she clears this she see blood. [name] to ENT for evaluation earlier this week, did laryngoscopy , epic note reviewed , no source of bleeding noted.  Seen by GI as well, underwent endoscopy with no source of bleeding . Esophageal dilation done with mild stenosis noted in proximal esophagus . 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  Was seen in the emergency room June 14, 2021 with A. fib RVR.  That lasted briefly and then converted to sinus rhythm. June 14, 2021 chest x-ray chronic elevation of left hemidiaphragm.  Stable radiation changes in the left apex.  Remains on Azathioprine for episcleritis. Followed by Opthamology .    Allergies  Allergen Reactions   Hydrocodone Hives and Itching   Hydrocodone-Acetaminophen Hives   Morphine And Related Other (See Comments)    GI PROBLEMS   Canagliflozin     Other reaction(s): Unknown   Metformin Hcl     Other reaction(s): diarrhea   Rosuvastatin Other (See Comments)    Pt reports causes lower extremity muscle aches/cramping   Lisinopril Cough   Nsaids Other (See Comments)    GI PROBLEMS    Immunization History  Administered Date(s) Administered   Influenza Split 08/14/2020   Influenza Whole 07/21/2009, 07/29/2010   Influenza,inj,Quad PF,6+ Mos 11/23/2015, 06/16/2019   Influenza-Unspecified 08/14/2016, 06/16/2019   Moderna SARS-COV2 Booster Vaccination 09/24/2020, 03/20/2021   PFIZER(Purple Top)SARS-COV-2 Vaccination 12/24/2019, 01/19/2020   PPD Test 06/19/2013   Pneumococcal Conjugate-13 04/18/2015   Pneumococcal Polysaccharide-23 07/29/2010, 06/16/2019, 06/16/2019   Tdap 09/24/2020   Zoster Recombinat (Shingrix) 12/09/2019, 02/14/2020  Past Medical History:  Diagnosis Date   Allergy    Anemia    Anemia    Aortic insufficiency    a. Mod by echo 05/2015.   Arthritis    "knees" (07/11/2015)   Atrial fibrillation (HCC)    On Eliquis   Carcinoma of hilus  of lung ([place]) 01/20/2012   IIIB NSCL  Right hilar mass compressing esophagus/presenting with dysphagia Rx Surgery/RT/chemo dx January 1791   Complication of anesthesia    "I had a hard time waking me up w/one of my shoulder surgeries"   Coronary artery disease    a. Abnormal stress test -> LHc 06/2015 s/p DES to mCX and mLAD, residual D2 disease treated medially.   Diverticulitis    Elevated blood pressure    Erythema nodosum    Heart murmur    Insomnia    lung ca dx'd 10/1999   chemo/xrt comp 05/29/2000   Myalgia    Nephrolithiasis 2015   Obesity    Pericardial effusion    a. 05/2015 Echo: EF 55-60%, no rwma, Gr 1 DD, no effusion - but an effusion was seen on CT which was felt to be increased in size compared to 12/2014 - pericardium also thickened.    Pericarditis    a. Dx 05/2015.   Pneumothorax, spontaneous, tension 01/20/2012   October, 2010   Pulmonary fibrosis ([place]) 01/20/2012   Due to previous surgery and chest radiation for lung cancer   Restrictive lung disease    Type II diabetes mellitus ([place])     Tobacco History: Social History   Tobacco Use  Smoking Status Former   Packs/day: 0.25   Years: 5.00   Pack years: 1.25   Types: Cigarettes   Quit date: 07/22/1999   Years since quitting: 21.9  Smokeless Tobacco Never   Counseling given: Not Answered   Outpatient Medications Prior to Visit  Medication Sig Dispense Refill   Accu-Chek Softclix Lancets lancets USE LANCETS 3 TIMES A DAY AS DIRECTED 30 DAYS     azaTHIOprine (IMURAN) 50 MG tablet Take 150 mg by mouth daily.     B-D UF III MINI PEN NEEDLES 31G X 5 MM MISC 2 (two) times daily. as directed     Blood Glucose Monitoring Suppl (ACCU-CHEK GUIDE) w/Device KIT See admin instructions.     co-enzyme Q-10 50 MG capsule TAKE 1 CAPSULE BY MOUTH EVERY DAY 30 capsule 7   diltiazem (CARDIZEM CD) 360 MG 24 hr capsule TAKE 1 CAPSULE BY MOUTH EVERY DAY 90 capsule 3   dofetilide (TIKOSYN) 500 MCG capsule TAKE 1 CAPSULE (500 MCG  TOTAL) BY MOUTH 2 (TWO) TIMES DAILY. 180 capsule 1   ELIQUIS 5 MG TABS tablet TAKE 1 TABLET BY MOUTH TWICE A DAY 505 tablet 1   folic acid (FOLVITE) 1 MG tablet Take 1 mg by mouth daily.     furosemide (LASIX) 40 MG tablet Take 1 tablet (40 mg total) by mouth daily as needed for fluid or edema. 30 tablet 6   HUMALOG MIX 75/25 KWIKPEN (75-25) 100 UNIT/ML Kwikpen Inject 30-40 Units into the skin See admin instructions. Inject 40 units SQ in Am and 35 units SQ in PM  6   levothyroxine (SYNTHROID, LEVOTHROID) 50 MCG tablet Take 50 mcg by mouth daily before breakfast.   6   losartan (COZAAR) 25 MG tablet TAKE 1 TABLET BY MOUTH EVERY DAY 90 tablet 1   meclizine (ANTIVERT) 12.5 MG tablet Take 1 tablet (12.5 mg total) by mouth  3 (three) times daily as needed for dizziness. 30 tablet 0   metoprolol succinate (TOPROL-XL) 50 MG 24 hr tablet Take 1.5 tablets (75 mg total) by mouth at bedtime. Take with or immediately following a meal. 135 tablet 3   OZEMPIC, 0.25 OR 0.5 MG/DOSE, 2 MG/1.5ML SOPN INJECT 0.5 MG INTO THE SKIN ONCE A WEEK. (Patient taking differently: as needed.) 3 pen 3   pravastatin (PRAVACHOL) 20 MG tablet Take 1 tablet (20 mg total) by mouth daily. 90 tablet 1   spironolactone (ALDACTONE) 25 MG tablet Take 1 tablet (25 mg total) by mouth daily. 90 tablet 3   nitroGLYCERIN (NITROSTAT) 0.4 MG SL tablet Place 1 tablet (0.4 mg total) under the tongue every 5 (five) minutes as needed for chest pain. 25 tablet 3   No facility-administered medications prior to visit.     Review of Systems:   Constitutional:   No  weight loss, night sweats,  Fevers, chills,  +fatigue, or  lassitude.  HEENT:   No headaches,  Difficulty swallowing,  Tooth/dental problems, or  Sore throat,                No sneezing, itching, ear ache,  +nasal congestion, post nasal drip,   CV:  No chest pain,  Orthopnea, PND, swelling in lower extremities, anasarca, dizziness, palpitations, syncope.   GI  No heartburn,  indigestion, abdominal pain, nausea, vomiting, diarrhea, change in bowel habits, loss of appetite, bloody stools.   Resp:   No chest wall deformity  Skin: no rash or lesions.  GU: no dysuria, change in color of urine, no urgency or frequency.  No flank pain, no hematuria   MS:  No joint pain or swelling.  No decreased range of motion.  No back pain.    Physical Exam  BP 134/78 (BP Location: Left Arm, Patient Position: Sitting, Cuff Size: Large)   Pulse 68   Temp 98.1 F (36.7 C) (Oral)   Ht '5\' 7"'  (1.702 m)   Wt 244 lb (110.7 kg)   SpO2 99%   BMI 38.22 kg/m   GEN: A/Ox3; pleasant , NAD, well nourished    HEENT:  [place]/AT,  NOSE-clear, THROAT-clear, no lesions, no postnasal drip or exudate noted.   NECK:  Supple w/ fair ROM; no JVD; normal carotid impulses w/o bruits; no thyromegaly or nodules palpated; no lymphadenopathy.    RESP  Clear  P & A; w/o, wheezes/ rales/ or rhonchi. no accessory muscle use, no dullness to percussion  CARD:  RRR, no m/r/g, no peripheral edema, pulses intact, no cyanosis or clubbing.  GI:   Soft & nt; nml bowel sounds; no organomegaly or masses detected.   Musco: Warm bil, no deformities or joint swelling noted.   Neuro: alert, no focal deficits noted.    Skin: Warm, no lesions or rashes    Lab Results:  CBC       Imaging: DG Chest 2 View  Result Date: 06/14/2021 CLINICAL DATA:  Shortness of breath and tachycardia, initial encounter EXAM: CHEST - 2 VIEW COMPARISON:  01/23/2020 FINDINGS: Cardiac shadow is at the upper limits of normal in size and stable. Aortic calcifications are noted as well as findings of prior coronary stenting. Chronic elevation of left hemidiaphragm is noted. No focal infiltrate or sizable effusion is noted. No bony abnormality is noted. Post radiation changes are noted in the left apex stable from prior study. IMPRESSION: No acute abnormality noted. Chronic changes as described above. Electronically Signed   By:  Inez Catalina M.D.   On: 06/14/2021 02:55   SLEEP STUDY DOCUMENTS  Result Date: 06/14/2021 Ordered by an unspecified provider.     No flowsheet data found.  No results found for: NITRICOXIDE      Assessment & Plan:   Hemoptysis Intermittent hemoptysis for 5 months , questionable etiology .  Chest xray and Cbc unrevealing . On Eliquis ENT and GI eval unrevealing for source.  Hx of lung cancer  Needs CT chest . If unremarkable and persists , consider Bronchoscopy   Plan  Patient Instructions  Claritin 5m daily for 1 week ,then as needed  Mucinex DM liquid 2 tsp Twice daily  for 1 week then as needed  Set up Chest CT  Follow up with Dr. AElsworth Soho in 4-6 weeks and As needed   Please contact office for sooner follow up if symptoms do not improve or worsen or seek emergency care        Restrictive lung disease Appears stable     I spent   30 minutes dedicated to the care of this patient on the date of this encounter to include pre-visit review of records, face-to-face time with the patient discussing conditions above, post visit ordering of testing, clinical documentation with the electronic health record, making appropriate referrals as documented, and communicating necessary findings to members of the patients care team.   TRexene Edison NP 06/22/2021

## 2021-06-22 NOTE — Assessment & Plan Note (Signed)
Appears stable 

## 2021-06-26 NOTE — Procedures (Signed)
   Patient Name: [name], Hillary Date:06/12/2021 Gender: Female D.O.B: 07-May-1956 Age (years): 64 Referring Provider: Daneen Schick Height (inches): 37 Interpreting Physician: Fransico Him MD, ABSM Weight (lbs): 232 RPSGT: Gwenyth Allegra BMI: 36 MRN: [id number] Neck Size: 14.00  CLINICAL INFORMATION Sleep Study Type: NPSG  Indication for sleep study: Diabetes  Epworth Sleepiness Score: 3  SLEEP STUDY TECHNIQUE As per the AASM Manual for the Scoring of Sleep and Associated Events v2.3 (April 2016) with a hypopnea requiring 4% desaturations.  The channels recorded and monitored were frontal, central and occipital EEG, electrooculogram (EOG), submentalis EMG (chin), nasal and oral airflow, thoracic and abdominal wall motion, anterior tibialis EMG, snore microphone, electrocardiogram, and pulse oximetry.  MEDICATIONS Medications self-administered by patient taken the night of the study : N/A  SLEEP ARCHITECTURE The study was initiated at 10:12:13 PM and ended at 4:31:06 AM.  Sleep onset time was 53.8 minutes and the sleep efficiency was 54.8%. The total sleep time was 207.6 minutes.  Stage REM latency was 76.0 minutes.  The patient spent 7.2% of the night in stage N1 sleep, 77.8% in stage N2 sleep, 0.0% in stage N3 and 14.9% in REM.  Alpha intrusion was absent.  Supine sleep was 12.33%.  RESPIRATORY PARAMETERS The overall apnea/hypopnea index (AHI) was 8.1 per hour. There were 0 total apneas, including 0 obstructive, 0 central and 0 mixed apneas. There were 28 hypopneas and 7 RERAs.  The AHI during Stage REM sleep was 48.4 per hour.  AHI while supine was 0.0 per hour.  The mean oxygen saturation was 92.4%. The minimum SpO2 during sleep was 84.0%.  soft snoring was noted during this study.  CARDIAC DATA The 2 lead EKG demonstrated sinus rhythm. The mean heart rate was 67.0 beats per minute. Other EKG findings include: None.  LEG MOVEMENT DATA The total PLMS were  0 with a resulting PLMS index of 0.0. Associated arousal with leg movement index was 0.0 .  IMPRESSIONS - Mild obstructive sleep apnea occurred during this study (AHI = 8.1/h). - Mild oxygen desaturation was noted during this study (Min O2 = 84.0%). - The patient snored with soft snoring volume. - No cardiac abnormalities were noted during this study. - Clinically significant periodic limb movements did not occur during sleep. No significant associated arousals.  DIAGNOSIS - Obstructive Sleep Apnea (G47.33)  RECOMMENDATIONS - Therapeutic CPAP titration to determine optimal pressure required to alleviate sleep disordered breathing. - Avoid alcohol, sedatives and other CNS depressants that may worsen sleep apnea and disrupt normal sleep architecture. - Sleep hygiene should be reviewed to assess factors that may improve sleep quality. - Weight management and regular exercise should be initiated or continued if appropriate.  [Electronically signed] 06/26/2021 08:59 AM  Fransico Him MD, ABSM Diplomate, American Board of Sleep Medicine

## 2021-06-28 ENCOUNTER — Other Ambulatory Visit: Payer: Self-pay

## 2021-06-28 ENCOUNTER — Ambulatory Visit (INDEPENDENT_AMBULATORY_CARE_PROVIDER_SITE_OTHER)
Admission: RE | Admit: 2021-06-28 | Discharge: 2021-06-28 | Disposition: A | Discharge status: Home / Self Care | Payer: Medicare PPO | Source: Ambulatory Visit | Attending: Adult Health | Admitting: Adult Health

## 2021-06-28 DIAGNOSIS — R042 Hemoptysis: Secondary | ICD-10-CM | POA: Diagnosis not present

## 2021-06-28 DIAGNOSIS — I700 Atherosclerosis of aorta: Secondary | ICD-10-CM | POA: Diagnosis not present

## 2021-06-29 ENCOUNTER — Telehealth: Payer: Self-pay | Admitting: *Deleted

## 2021-06-29 DIAGNOSIS — G4733 Obstructive sleep apnea (adult) (pediatric): Secondary | ICD-10-CM

## 2021-06-29 NOTE — Telephone Encounter (Signed)
-----   Message from Sueanne Margarita, MD sent at 06/26/2021  9:01 AM EDT ----- Please let patient know that they have sleep apnea.  Recommend therapeutic CPAP titration for treatment of patient's sleep disordered breathing.  If unable to perform an in lab titration then initiate ResMed auto CPAP from 4 to 15cm H2O with heated humidity and mask of choice and overnight pulse ox on CPAP.

## 2021-07-04 ENCOUNTER — Telehealth: Payer: Self-pay | Admitting: Adult Health

## 2021-07-04 NOTE — Telephone Encounter (Signed)
The patient has been notified of the result and verbalized understanding.  Left detailed message on voicemail and informed patient to call back for results. Marolyn Hammock, CMA 07/04/2021 10:14 AM

## 2021-07-04 NOTE — Telephone Encounter (Signed)
CT chest shows stable chronic posttreatment changes of the left apex and left lower lung.  Previous radiation changes.  Stable right lung resection changes.  No acute process noted.   Continue with office visit recommendations and follow-up.  If hemoptysis does not stop she will need a follow-up visit for further evaluation   Please contact office for sooner follow up if symptoms do not improve or worsen or seek emergency care   I have called the pt and she is aware of results per TP.  Pt stated that she will call back for appt if needed.

## 2021-07-04 NOTE — Telephone Encounter (Signed)
Not Authorized:requires clinical review. Procedure Tracking #: [id number]

## 2021-07-04 NOTE — Progress Notes (Signed)
ATC x 1.  LM to return call.

## 2021-07-05 NOTE — Telephone Encounter (Addendum)
Ok to schedule , auth # [id number], valid 07/20/21--08/19/21.

## 2021-07-09 ENCOUNTER — Ambulatory Visit (HOSPITAL_COMMUNITY): Payer: Medicare PPO | Attending: Cardiology

## 2021-07-09 ENCOUNTER — Other Ambulatory Visit: Payer: Self-pay

## 2021-07-09 DIAGNOSIS — I300 Acute nonspecific idiopathic pericarditis: Secondary | ICD-10-CM | POA: Insufficient documentation

## 2021-07-09 DIAGNOSIS — I5032 Chronic diastolic (congestive) heart failure: Secondary | ICD-10-CM | POA: Diagnosis not present

## 2021-07-09 LAB — ECHOCARDIOGRAM COMPLETE
AR max vel: 2.79 cm2
AV Area VTI: 2.57 cm2
AV Area mean vel: 2.43 cm2
AV Mean grad: 7 mmHg
AV Peak grad: 12.4 mmHg
Ao pk vel: 1.76 m/s
Area-P 1/2: 3.83 cm2
P 1/2 time: 468 msec
S' Lateral: 2.5 cm

## 2021-07-11 NOTE — Progress Notes (Signed)
Cardiology Office Note:    Date:  07/12/2021   ID:  [name], DOB 02/11/1956, MRN [id number]  PCP:  Glendale Chard, MD  Cardiologist:  Sinclair Grooms, MD   Referring MD: Glendale Chard, MD   Chief Complaint  Patient presents with   Chest Pain    History of Present Illness:    [name] is a 65 y.o. female with a hx of CAD s/p DES to [facility] and mLAD 07/11/15 (patent stents on repeat cath 11/2016), chronic pericardial effusion, h/o chronic pericarditis with chronic pericardial rub treated intermittently with steroids and colchicine, iritis, PAF on Eliquis and dofetilide therapy, chronic diastolic CHF, DM, HTN, remote h/o treated non-small cell lung cancer, pulmonary fibrosis, aortic insufficiency, COPD and anemia.   He has a difficult time describing her chest pain.  Chest pain over her lifetime is been of multiple qualities.  She did have a feeling of chest pressure prior to having stents placed in 2016.  This is never recurred since stenting.  She had pericarditis in 2018.  She has had intermittent recurring episodes of poorly characterized chest discomfort in the left subclavicular and left precordial area.  This discomfort at times is made better with sitting or leaning forward.  She cannot lay on her left side because the pain is intensified.  She has a history of knee elbow and hand stiffness and discomfort.  Recent diagnosis of iritis and is currently on Imuran therapy for this.  Past Medical History:  Diagnosis Date   Allergy    Anemia    Anemia    Aortic insufficiency    a. Mod by echo 05/2015.   Arthritis    "knees" (07/11/2015)   Atrial fibrillation (HCC)    On Eliquis   Carcinoma of hilus of lung ([place]) 01/20/2012   IIIB NSCL  Right hilar mass compressing esophagus/presenting with dysphagia Rx Surgery/RT/chemo dx January 4098   Complication of anesthesia    "I had a hard time waking me up w/one of my shoulder surgeries"   Coronary artery disease    a. Abnormal  stress test -> LHc 06/2015 s/p DES to mCX and mLAD, residual D2 disease treated medially.   Diverticulitis    Elevated blood pressure    Erythema nodosum    Heart murmur    Insomnia    lung ca dx'd 10/1999   chemo/xrt comp 05/29/2000   Myalgia    Nephrolithiasis 2015   Obesity    Pericardial effusion    a. 05/2015 Echo: EF 55-60%, no rwma, Gr 1 DD, no effusion - but an effusion was seen on CT which was felt to be increased in size compared to 12/2014 - pericardium also thickened.    Pericarditis    a. Dx 05/2015.   Pneumothorax, spontaneous, tension 01/20/2012   October, 2010   Pulmonary fibrosis ([place]) 01/20/2012   Due to previous surgery and chest radiation for lung cancer   Restrictive lung disease    Type II diabetes mellitus [facility])     Past Surgical History:  Procedure Laterality Date   ABDOMINAL HYSTERECTOMY  10/21/2006   APPENDECTOMY  1969?   CARDIAC CATHETERIZATION N/A 07/11/2015   Procedure: Left Heart Cath and Coronary Angiography;  Surgeon: Jettie Booze, MD;  Location: [place] [facility];  Service: Cardiovascular;  Laterality: N/A;   CARDIAC CATHETERIZATION  07/11/2015   Procedure: Coronary Stent Intervention;  Surgeon: Jettie Booze, MD;  Location: [place] [facility];  Service: Cardiovascular;;  CARPAL TUNNEL RELEASE Bilateral 1998-2005?   right-left   CARPAL TUNNEL RELEASE Left 2009?   "for the 2nd time"   COLONOSCOPY     COLONOSCOPY WITH PROPOFOL N/A 05/28/2018   Procedure: COLONOSCOPY WITH PROPOFOL;  Surgeon: Milus Banister, MD;  Location: [facility];  Service: Endoscopy;  Laterality: N/A;   CORONARY ANGIOPLASTY     CYSTOSCOPY W/ STONE MANIPULATION  10/22/2011   ESOPHAGOGASTRODUODENOSCOPY (EGD) WITH ESOPHAGEAL DILATION  10/21/2010   FOOT SURGERY Bilateral 06/22/1979   Callous removed    INNER EAR SURGERY Right ~ 2009   KNEE ARTHROPLASTY Right 10/21/1989   KNEE ARTHROSCOPY  ~ 2000   LATERAL RELEASE   LEFT HEART CATH AND CORONARY ANGIOGRAPHY N/A  12/18/2016   Procedure: Left Heart Cath and Coronary Angiography;  Surgeon: Burnell Blanks, MD;  Location: [place] [facility];  Service: Cardiovascular;  Laterality: N/A;   LEFT HEART CATH AND CORONARY ANGIOGRAPHY N/A 02/01/2021   Procedure: LEFT HEART CATH AND CORONARY ANGIOGRAPHY;  Surgeon: Leonie Man, MD;  Location: [place] [facility];  Service: Cardiovascular;  Laterality: N/A;   LUNG CANCER SURGERY Left 10/22/1999   "small cell"   LUNG SURGERY Right 10/21/2005   "reinflated it"   PULMONARY EMBOLISM SURGERY  10/21/2005   LUNG COLLAPSE   RIGHT AND LEFT HEART CATH N/A 06/25/2017   Procedure: RIGHT AND LEFT HEART CATH;  Surgeon: Sherren Mocha, MD;  Location: [place] [facility];  Service: Cardiovascular;  Laterality: N/A;   SHOULDER ADHESION RELEASE Right ~ 2004   SHOULDER ARTHROSCOPY W/ ROTATOR CUFF REPAIR Right ~ 2003   UPPER GASTROINTESTINAL ENDOSCOPY  06/05/2021    Current Medications: Current Meds  Medication Sig   Accu-Chek Softclix Lancets lancets USE LANCETS 3 TIMES A DAY AS DIRECTED 30 DAYS   azaTHIOprine (IMURAN) 50 MG tablet Take 150 mg by mouth daily.   B-D UF III MINI PEN NEEDLES 31G X 5 MM MISC 2 (two) times daily. as directed   Blood Glucose Monitoring Suppl (ACCU-CHEK GUIDE) w/Device KIT See admin instructions.   co-enzyme Q-10 50 MG capsule TAKE 1 CAPSULE BY MOUTH EVERY DAY   diltiazem (CARDIZEM CD) 360 MG 24 hr capsule TAKE 1 CAPSULE BY MOUTH EVERY DAY   dofetilide (TIKOSYN) 500 MCG capsule TAKE 1 CAPSULE (500 MCG TOTAL) BY MOUTH 2 (TWO) TIMES DAILY.   ELIQUIS 5 MG TABS tablet TAKE 1 TABLET BY MOUTH TWICE A DAY   folic acid (FOLVITE) 1 MG tablet Take 1 mg by mouth daily.   furosemide (LASIX) 40 MG tablet Take 1 tablet (40 mg total) by mouth daily as needed for fluid or edema.   HUMALOG MIX 75/25 KWIKPEN (75-25) 100 UNIT/ML Kwikpen Inject 30-40 Units into the skin See admin instructions. Inject 40 units SQ in Am and 35 units SQ in PM   levothyroxine  (SYNTHROID, LEVOTHROID) 50 MCG tablet Take 50 mcg by mouth daily before breakfast.    losartan (COZAAR) 25 MG tablet TAKE 1 TABLET BY MOUTH EVERY DAY   meclizine (ANTIVERT) 12.5 MG tablet Take 1 tablet (12.5 mg total) by mouth 3 (three) times daily as needed for dizziness.   metoprolol succinate (TOPROL-XL) 50 MG 24 hr tablet Take 1.5 tablets (75 mg total) by mouth at bedtime. Take with or immediately following a meal.   OZEMPIC, 0.25 OR 0.5 MG/DOSE, 2 MG/1.5ML SOPN INJECT 0.5 MG INTO THE SKIN ONCE A WEEK. (Patient taking differently: as needed.)   pravastatin (PRAVACHOL) 20 MG tablet Take 1 tablet (  20 mg total) by mouth daily.   spironolactone (ALDACTONE) 25 MG tablet Take 1 tablet (25 mg total) by mouth daily.     Allergies:   Hydrocodone, Hydrocodone-acetaminophen, Morphine and related, Canagliflozin, Metformin hcl, Rosuvastatin, Lisinopril, and Nsaids   Social History   Socioeconomic History   Marital status: Single    Spouse name: Not on file   Number of children: 0   Years of education: Not on file   Highest education level: Not on file  Occupational History   Occupation: RETIRED  Tobacco Use   Smoking status: Former    Packs/day: 0.25    Years: 5.00    Pack years: 1.25    Types: Cigarettes    Quit date: 07/22/1999    Years since quitting: 21.9   Smokeless tobacco: Never  Vaping Use   Vaping Use: Never used  Substance and Sexual Activity   Alcohol use: No    Alcohol/week: 0.0 standard drinks   Drug use: No   Sexual activity: Yes    Birth control/protection: Other-see comments    Comment: hysterectomy  Other Topics Concern   Not on file  Social History Narrative   Not on file   Social Determinants of Health   Financial Resource Strain: Low Risk    Difficulty of Paying Living Expenses: Not hard at all  Food Insecurity: No Food Insecurity   Worried About Charity fundraiser in the Last Year: Never true   [place] in the Last Year: Never true  Transportation  Needs: No Transportation Needs   Lack of Transportation (Medical): No   Lack of Transportation (Non-Medical): No  Physical Activity: Inactive   Days of Exercise per Week: 0 days   Minutes of Exercise per Session: 0 min  Stress: No Stress Concern Present   Feeling of Stress : Not at all  Social Connections: Not on file     Family History: The patient's family history includes Diabetes in her father, maternal grandmother, mother, paternal grandmother, sister, sister, sister, and sister; Heart disease in her father; Hypertension in her sister; Kidney disease in her sister; Pancreatic cancer in her mother. There is no history of Colon cancer, Colon polyps, Rectal cancer, Stomach cancer, Esophageal cancer, or Breast cancer.  ROS:   Please see the history of present illness.    She takes azathioprine 150 mg daily for iritis.  All other systems reviewed and are negative.  EKGs/Labs/Other Studies Reviewed:    The following studies were reviewed today:  CARDIAC CATH 01/2021:    2D Doppler echocardiogram 07/09/2021 IMPRESSIONS     1. Left ventricular ejection fraction, by estimation, is 60 to 65%. The  left ventricle has normal function. The left ventricle has no regional  wall motion abnormalities. There is mild left ventricular hypertrophy.  Left ventricular diastolic parameters  are consistent with Grade II diastolic dysfunction (pseudonormalization).   2. Right ventricular systolic function is normal. The right ventricular  size is normal. There is normal pulmonary artery systolic pressure. The  estimated right ventricular systolic pressure is 70.9 mmHg.   3. Left atrial size was mildly dilated.   4. Right atrial size was mildly dilated.   5. A small pericardial effusion is present. No evidence for tamponade. No  evidence for effusive/constrictive pericarditis (normal IVC size, no  respirophasic variation of the interventricular septal position).   6. The mitral valve is normal in  structure. No evidence of mitral valve  regurgitation. No evidence of mitral stenosis.  7. The aortic valve is tricuspid. Aortic valve regurgitation is moderate.  No aortic stenosis is present.   8. The inferior vena cava is normal in size with greater than 50%  respiratory variability, suggesting right atrial pressure of 3 mmHg.   Cardiac MRI June 2018: IMPRESSION: 1) Moderate pericardial effusion lateral to the RV free wall and posterior LV myocardium. Note this is not appreciated on recent echo   2) Normal pericardial thickness with mild gadolinium uptake over the RV free wall and apex on 3 chamber view   3) No evidence of constriction although MRI especially with no free breathing sequences not ideal to assess Pericardium normal thickness and no ventricular inter dependence   4) Normal LV EF 59% (EDV 105 ESV 43 SV 62 cc)   5) No evidence of myocarditis or LV myocardial uptake of gadolinium on delayed inversion recovery sequences   6) Tri leaflet aortic valve with AR   7) Mild LAE   Images were also reviewed by Dr Loralie Champagne who agreed with above findings  EKG:  EKG normal sinus rhythm, J-point elevation in lead I, II, III, aVF, V3 through V6.  This is unchanged from the EKG performed 06/15/2021 the heart rate is slower.  Recent Labs: 09/08/2020: TSH 2.62 03/05/2021: Magnesium 1.8 04/25/2021: ALT 9 06/14/2021: BUN 24; Creatinine, Ser 1.07; Hemoglobin 11.6; Platelets 285; Potassium 3.7; Sodium 140  Recent Lipid Panel    Component Value Date/Time   CHOL 130 04/25/2021 1614   TRIG 102 04/25/2021 1614   HDL 44 04/25/2021 1614   CHOLHDL 3.0 04/25/2021 1614   CHOLHDL 3.1 02/17/2019 0215   VLDL 17 02/17/2019 0215   LDLCALC 67 04/25/2021 1614    Physical Exam:    VS:  BP 102/62   Pulse 71   Ht 5' 5.6" (1.666 m)   Wt 236 lb 9.6 oz (107.3 kg)   SpO2 98%   BMI 38.66 kg/m     Wt Readings from Last 3 Encounters:  07/12/21 236 lb 9.6 oz (107.3 kg)  07/12/21 235 lb  9.6 oz (106.9 kg)  06/22/21 244 lb (110.7 kg)     GEN: Overweight. No acute distress HEENT: Normal NECK: No JVD. LYMPHATICS: No lymphadenopathy CARDIAC: 2/6 murmur. RRR S4 gallop, or edema.  No pericardial rub is heard VASCULAR:  Normal Pulses. No bruits. RESPIRATORY:  Clear to auscultation without rales, wheezing or rhonchi  ABDOMEN: Soft, non-tender, non-distended, No pulsatile mass, MUSCULOSKELETAL: No deformity  SKIN: Warm and dry NEUROLOGIC:  Alert and oriented x 3 PSYCHIATRIC:  Normal affect   ASSESSMENT:    1. Coronary artery disease involving native coronary artery of native heart without angina pectoris   2. Persistent atrial fibrillation ([place])   3. Chronic diastolic (congestive) heart failure ([place])   4. Recurrent idiopathic pericarditis   5. OSA (obstructive sleep apnea)   6. Hyperlipidemia, unspecified hyperlipidemia type   7. Hypertension, essential    PLAN:    In order of problems listed above:  Relatively recent coronary angio demonstrated widely patent coronaries including previously placed stents.  See report above.  Current chest discomfort is not ischemic. When the last tracing was done patient was in sinus rhythm.  She has had persistent A. fib in the past.  Today's tracing is improved with a slow heart rate and maintain sinus rhythm. Volume overload is not present on today's exam. The patient has a history of iritis, pulmonary fibrosis, recurrent idiopathic pericarditis, raising question of an autoimmune disease process such as  Reiter's syndrome, Behcet's disease, rheumatoid arthritis, or ankylosing spondylitis.  Needs to have a autoimmune disease work-up.  Sarcoidosis would also be a consideration.  Cardiac MRI might be helpful.  She has had a prior MRI in 2018 that had some LGE but the findings did not consistent with myocarditis.  We will do a serologic work-up for autoimmune disease, check sed rate, check CRP, check rheumatoid factor, and start colchicine 0.6  mg/day. Sleep apnea has been diagnosed.  CPAP titration needs to occur. Did not discuss Did not discuss  Will refer to rheumatology if markers come back elevated.  80-monthfollow-up with me.   Medication Adjustments/Labs and Tests Ordered: Current medicines are reviewed at length with the patient today.  Concerns regarding medicines are outlined above.  No orders of the defined types were placed in this encounter.  No orders of the defined types were placed in this encounter.   There are no Patient Instructions on file for this visit.   Signed, HSinclair Grooms MD  07/12/2021 3:30 PM    CLake [place]

## 2021-07-12 ENCOUNTER — Ambulatory Visit: Payer: Medicare PPO | Admitting: Interventional Cardiology

## 2021-07-12 ENCOUNTER — Ambulatory Visit: Payer: Medicare PPO | Admitting: Internal Medicine

## 2021-07-12 ENCOUNTER — Encounter: Payer: Self-pay | Admitting: Interventional Cardiology

## 2021-07-12 ENCOUNTER — Ambulatory Visit (INDEPENDENT_AMBULATORY_CARE_PROVIDER_SITE_OTHER): Payer: Medicare PPO

## 2021-07-12 ENCOUNTER — Other Ambulatory Visit: Payer: Self-pay

## 2021-07-12 VITALS — BP 120/68 | HR 76 | Temp 98.1°F | Ht 65.6 in | Wt 235.6 lb

## 2021-07-12 VITALS — BP 102/62 | HR 71 | Ht 65.6 in | Wt 236.6 lb

## 2021-07-12 DIAGNOSIS — Z0000 Encounter for general adult medical examination without abnormal findings: Secondary | ICD-10-CM

## 2021-07-12 DIAGNOSIS — G4733 Obstructive sleep apnea (adult) (pediatric): Secondary | ICD-10-CM

## 2021-07-12 DIAGNOSIS — I10 Essential (primary) hypertension: Secondary | ICD-10-CM

## 2021-07-12 DIAGNOSIS — I2510 Atherosclerotic heart disease of native coronary artery without angina pectoris: Secondary | ICD-10-CM

## 2021-07-12 DIAGNOSIS — E785 Hyperlipidemia, unspecified: Secondary | ICD-10-CM

## 2021-07-12 DIAGNOSIS — I300 Acute nonspecific idiopathic pericarditis: Secondary | ICD-10-CM | POA: Diagnosis not present

## 2021-07-12 DIAGNOSIS — I5032 Chronic diastolic (congestive) heart failure: Secondary | ICD-10-CM

## 2021-07-12 DIAGNOSIS — I4819 Other persistent atrial fibrillation: Secondary | ICD-10-CM

## 2021-07-12 MED ORDER — COLCHICINE 0.6 MG PO TABS: 0.6000 mg | ORAL_TABLET | Freq: Every day | ORAL | 0 refills | Status: DC

## 2021-07-12 NOTE — Patient Instructions (Signed)
[name] , Thank you for taking time to come for your Medicare Wellness Visit. I appreciate your ongoing commitment to your health goals. Please review the following plan we discussed and let me know if I can assist you in the future.   Screening recommendations/referrals: Colonoscopy: completed 05/28/2018 Mammogram: completed 02/05/2021 Bone Density: n/a Recommended yearly ophthalmology/optometry visit for glaucoma screening and checkup Recommended yearly dental visit for hygiene and checkup  Vaccinations: Influenza vaccine: due Pneumococcal vaccine: completed 06/16/2019 Tdap vaccine: completed 09/24/2020, due 09/24/2030 Shingles vaccine: completed  Covid-19:  03/20/2021, 09/24/2020, 01/19/2020, 12/24/2019  Advanced directives: Advance directive discussed with you today. Even though you declined this today please call our office should you change your mind and we can give you the proper paperwork for you to fill out.  Conditions/risks identified: none  Next appointment: Follow up in one year for your annual wellness visit.   Preventive Care 40-64 Years, Female Preventive care refers to lifestyle choices and visits with your health care provider that can promote health and wellness. What does preventive care include? A yearly physical exam. This is also called an annual well check. Dental exams once or twice a year. Routine eye exams. Ask your health care provider how often you should have your eyes checked. Personal lifestyle choices, including: Daily care of your teeth and gums. Regular physical activity. Eating a healthy diet. Avoiding tobacco and drug use. Limiting alcohol use. Practicing safe sex. Taking low-dose aspirin daily starting at age 39. Taking vitamin and mineral supplements as recommended by your health care provider. What happens during an annual well check? The services and screenings done by your health care provider during your annual well check will depend on your  age, overall health, lifestyle risk factors, and family history of disease. Counseling  Your health care provider may ask you questions about your: Alcohol use. Tobacco use. Drug use. Emotional well-being. Home and relationship well-being. Sexual activity. Eating habits. Work and work Statistician. Method of birth control. Menstrual cycle. Pregnancy history. Screening  You may have the following tests or measurements: Height, weight, and BMI. Blood pressure. Lipid and cholesterol levels. These may be checked every 5 years, or more frequently if you are over 81 years old. Skin check. Lung cancer screening. You may have this screening every year starting at age 82 if you have a 30-pack-year history of smoking and currently smoke or have quit within the past 15 years. Fecal occult blood test (FOBT) of the stool. You may have this test every year starting at age 72. Flexible sigmoidoscopy or colonoscopy. You may have a sigmoidoscopy every 5 years or a colonoscopy every 10 years starting at age 86. Hepatitis C blood test. Hepatitis B blood test. Sexually transmitted disease (STD) testing. Diabetes screening. This is done by checking your blood sugar (glucose) after you have not eaten for a while (fasting). You may have this done every 1-3 years. Mammogram. This may be done every 1-2 years. Talk to your health care provider about when you should start having regular mammograms. This may depend on whether you have a family history of breast cancer. BRCA-related cancer screening. This may be done if you have a family history of breast, ovarian, tubal, or peritoneal cancers. Pelvic exam and Pap test. This may be done every 3 years starting at age 42. Starting at age 11, this may be done every 5 years if you have a Pap test in combination with an HPV test. Bone density scan. This is done to  screen for osteoporosis. You may have this scan if you are at high risk for osteoporosis. Discuss your test  results, treatment options, and if necessary, the need for more tests with your health care provider. Vaccines  Your health care provider may recommend certain vaccines, such as: Influenza vaccine. This is recommended every year. Tetanus, diphtheria, and acellular pertussis (Tdap, Td) vaccine. You may need a Td booster every 10 years. Zoster vaccine. You may need this after age 68. Pneumococcal 13-valent conjugate (PCV13) vaccine. You may need this if you have certain conditions and were not previously vaccinated. Pneumococcal polysaccharide (PPSV23) vaccine. You may need one or two doses if you smoke cigarettes or if you have certain conditions. Talk to your health care provider about which screenings and vaccines you need and how often you need them. This information is not intended to replace advice given to you by your health care provider. Make sure you discuss any questions you have with your health care provider. Document Released: 11/03/2015 Document Revised: 06/26/2016 Document Reviewed: 08/08/2015 Elsevier Interactive Patient Education  2017 [place] Prevention in the Home Falls can cause injuries. They can happen to people of all ages. There are many things you can do to make your home safe and to help prevent falls. What can I do on the outside of my home? Regularly fix the edges of walkways and driveways and fix any cracks. Remove anything that might make you trip as you walk through a door, such as a raised step or threshold. Trim any bushes or trees on the path to your home. Use bright outdoor lighting. Clear any walking paths of anything that might make someone trip, such as rocks or tools. Regularly check to see if handrails are loose or broken. Make sure that both sides of any steps have handrails. Any raised decks and porches should have guardrails on the edges. Have any leaves, snow, or ice cleared regularly. Use sand or salt on walking paths during  winter. Clean up any spills in your garage right away. This includes oil or grease spills. What can I do in the bathroom? Use night lights. Install grab bars by the toilet and in the tub and shower. Do not use towel bars as grab bars. Use non-skid mats or decals in the tub or shower. If you need to sit down in the shower, use a plastic, non-slip stool. Keep the floor dry. Clean up any water that spills on the floor as soon as it happens. Remove soap buildup in the tub or shower regularly. Attach bath mats securely with double-sided non-slip rug tape. Do not have throw rugs and other things on the floor that can make you trip. What can I do in the bedroom? Use night lights. Make sure that you have a light by your bed that is easy to reach. Do not use any sheets or blankets that are too big for your bed. They should not hang down onto the floor. Have a firm chair that has side arms. You can use this for support while you get dressed. Do not have throw rugs and other things on the floor that can make you trip. What can I do in the kitchen? Clean up any spills right away. Avoid walking on wet floors. Keep items that you use a lot in easy-to-reach places. If you need to reach something above you, use a strong step stool that has a grab bar. Keep electrical cords out of the way.  Do not use floor polish or wax that makes floors slippery. If you must use wax, use non-skid floor wax. Do not have throw rugs and other things on the floor that can make you trip. What can I do with my stairs? Do not leave any items on the stairs. Make sure that there are handrails on both sides of the stairs and use them. Fix handrails that are broken or loose. Make sure that handrails are as long as the stairways. Check any carpeting to make sure that it is firmly attached to the stairs. Fix any carpet that is loose or worn. Avoid having throw rugs at the top or bottom of the stairs. If you do have throw rugs,  attach them to the floor with carpet tape. Make sure that you have a light switch at the top of the stairs and the bottom of the stairs. If you do not have them, ask someone to add them for you. What else can I do to help prevent falls? Wear shoes that: Do not have high heels. Have rubber bottoms. Are comfortable and fit you well. Are closed at the toe. Do not wear sandals. If you use a stepladder: Make sure that it is fully opened. Do not climb a closed stepladder. Make sure that both sides of the stepladder are locked into place. Ask someone to hold it for you, if possible. Clearly mark and make sure that you can see: Any grab bars or handrails. First and last steps. Where the edge of each step is. Use tools that help you move around (mobility aids) if they are needed. These include: Canes. Walkers. Scooters. Crutches. Turn on the lights when you go into a dark area. Replace any light bulbs as soon as they burn out. Set up your furniture so you have a clear path. Avoid moving your furniture around. If any of your floors are uneven, fix them. If there are any pets around you, be aware of where they are. Review your medicines with your doctor. Some medicines can make you feel dizzy. This can increase your chance of falling. Ask your doctor what other things that you can do to help prevent falls. This information is not intended to replace advice given to you by your health care provider. Make sure you discuss any questions you have with your health care provider. Document Released: 08/03/2009 Document Revised: 03/14/2016 Document Reviewed: 11/11/2014 Elsevier Interactive Patient Education  2017 [employer].

## 2021-07-12 NOTE — Patient Instructions (Signed)
Medication Instructions:  1) START Colchicine 0.27m once daily  *If you need a refill on your cardiac medications before your next appointment, please call your pharmacy*   Lab Work: ESR, CRP, ANA, Rheumatoid Factor and Lupus Panel today  If you have labs (blood work) drawn today and your tests are completely normal, you will receive your results only by: [facility] [place](if you have MyChart) OR A paper copy in the mail If you have any lab test that is abnormal or we need to change your treatment, we will call you to review the results.   Testing/Procedures: None   Follow-Up: [facility] you and your health needs are our priority.  As part of our continuing mission to provide you with exceptional heart care, we have created designated Provider Care Teams.  These Care Teams include your primary Cardiologist (physician) and Advanced Practice Providers (APPs -  Physician Assistants and Nurse Practitioners) who all work together to provide you with the care you need, when you need it.  We recommend signing up for the patient portal called "MyChart".  Sign up information is provided on this After Visit Summary.  MyChart is used to connect with patients for Virtual Visits (Telemedicine).  Patients are able to view lab/test results, encounter notes, upcoming appointments, etc.  Non-urgent messages can be sent to your provider as well.   To learn more about what you can do with MyChart, go to [email or web address]    Your next appointment:   3 month(s)  The format for your next appointment:   In Person  Provider:   You may see HSinclair Grooms MD or one of the following Advanced Practice Providers on your designated Care Team:   LCecilie Kicks NP   Other Instructions  You have been referred to Rheumatology.

## 2021-07-12 NOTE — Progress Notes (Signed)
This visit occurred during the SARS-CoV-2 public health emergency.  Safety protocols were in place, including screening questions prior to the visit, additional usage of staff PPE, and extensive cleaning of exam room while observing appropriate contact time as indicated for disinfecting solutions.  Subjective:   [name] is a 65 y.o. female who presents for Medicare Annual (Subsequent) preventive examination.  Review of Systems     Cardiac Risk Factors include: diabetes mellitus;hypertension;obesity (BMI >30kg/m2);sedentary lifestyle     Objective:    Today's Vitals   07/12/21 0900 07/12/21 0906  BP: 120/68   Pulse: 76   Temp: 98.1 F (36.7 C)   TempSrc: Oral   SpO2: 94%   Weight: 235 lb 9.6 oz (106.9 kg)   Height: 5' 5.6" (1.666 m)   PainSc:  5    Body mass index is 38.49 kg/m.  Advanced Directives 07/12/2021 06/14/2021 06/12/2021 02/01/2021 07/06/2020 06/30/2019 02/17/2019  Does Patient Have a Medical Advance Directive? _0  No -  Would patient like information on creating a medical advance directive? No - Patient declined - No - Patient declined No - Patient declined No - Patient declined - No - Patient declined    Current Medications (verified) Outpatient Encounter Medications as of 07/12/2021  Medication Sig   Accu-Chek Softclix Lancets lancets USE LANCETS 3 TIMES A DAY AS DIRECTED 30 DAYS   azaTHIOprine (IMURAN) 50 MG tablet Take 150 mg by mouth daily.   B-D UF III MINI PEN NEEDLES 31G X 5 MM MISC 2 (two) times daily. as directed   Blood Glucose Monitoring Suppl (ACCU-CHEK GUIDE) w/Device KIT See admin instructions.   co-enzyme Q-10 50 MG capsule TAKE 1 CAPSULE BY MOUTH EVERY DAY   diltiazem (CARDIZEM CD) 360 MG 24 hr capsule TAKE 1 CAPSULE BY MOUTH EVERY DAY   dofetilide (TIKOSYN) 500 MCG capsule TAKE 1 CAPSULE (500 MCG TOTAL) BY MOUTH 2 (TWO) TIMES DAILY.   ELIQUIS 5 MG TABS tablet TAKE 1 TABLET BY MOUTH TWICE A DAY   folic acid (FOLVITE) 1 MG tablet Take 1  mg by mouth daily.   furosemide (LASIX) 40 MG tablet Take 1 tablet (40 mg total) by mouth daily as needed for fluid or edema.   HUMALOG MIX 75/25 KWIKPEN (75-25) 100 UNIT/ML Kwikpen Inject 30-40 Units into the skin See admin instructions. Inject 40 units SQ in Am and 35 units SQ in PM   levothyroxine (SYNTHROID, LEVOTHROID) 50 MCG tablet Take 50 mcg by mouth daily before breakfast.    losartan (COZAAR) 25 MG tablet TAKE 1 TABLET BY MOUTH EVERY DAY   metoprolol succinate (TOPROL-XL) 50 MG 24 hr tablet Take 1.5 tablets (75 mg total) by mouth at bedtime. Take with or immediately following a meal.   OZEMPIC, 0.25 OR 0.5 MG/DOSE, 2 MG/1.5ML SOPN INJECT 0.5 MG INTO THE SKIN ONCE A WEEK. (Patient taking differently: as needed.)   pravastatin (PRAVACHOL) 20 MG tablet Take 1 tablet (20 mg total) by mouth daily.   spironolactone (ALDACTONE) 25 MG tablet Take 1 tablet (25 mg total) by mouth daily.   meclizine (ANTIVERT) 12.5 MG tablet Take 1 tablet (12.5 mg total) by mouth 3 (three) times daily as needed for dizziness. (Patient not taking: Reported on 07/12/2021)   nitroGLYCERIN (NITROSTAT) 0.4 MG SL tablet Place 1 tablet (0.4 mg total) under the tongue every 5 (five) minutes as needed for chest pain.   [DISCONTINUED] diltiazem (CARDIZEM CD) 360 MG 24 hr capsule TAKE 1 CAPSULE BY MOUTH  EVERY DAY   [DISCONTINUED] ELIQUIS 5 MG TABS tablet TAKE 1 TABLET BY MOUTH TWICE A DAY   [DISCONTINUED] losartan (COZAAR) 25 MG tablet TAKE 1 TABLET BY MOUTH EVERY DAY   [DISCONTINUED] metoprolol tartrate (LOPRESSOR) 25 MG tablet TAKE 1 TABLET BY MOUTH TWICE A DAY   [DISCONTINUED] pravastatin (PRAVACHOL) 20 MG tablet TAKE 1 TABLET BY MOUTH EVERY DAY   [DISCONTINUED] spironolactone (ALDACTONE) 25 MG tablet TAKE 1 TABLET BY MOUTH EVERY DAY   No facility-administered encounter medications on file as of 07/12/2021.    Allergies (verified) Hydrocodone, Hydrocodone-acetaminophen, Morphine and related, Canagliflozin, Metformin hcl,  Rosuvastatin, Lisinopril, and Nsaids   History: Past Medical History:  Diagnosis Date   Allergy    Anemia    Anemia    Aortic insufficiency    a. Mod by echo 05/2015.   Arthritis    "knees" (07/11/2015)   Atrial fibrillation (HCC)    On Eliquis   Carcinoma of hilus of lung ([place]) 01/20/2012   IIIB NSCL  Right hilar mass compressing esophagus/presenting with dysphagia Rx Surgery/RT/chemo dx January 7342   Complication of anesthesia    "I had a hard time waking me up w/one of my shoulder surgeries"   Coronary artery disease    a. Abnormal stress test -> LHc 06/2015 s/p DES to mCX and mLAD, residual D2 disease treated medially.   Diverticulitis    Elevated blood pressure    Erythema nodosum    Heart murmur    Insomnia    lung ca dx'd 10/1999   chemo/xrt comp 05/29/2000   Myalgia    Nephrolithiasis 2015   Obesity    Pericardial effusion    a. 05/2015 Echo: EF 55-60%, no rwma, Gr 1 DD, no effusion - but an effusion was seen on CT which was felt to be increased in size compared to 12/2014 - pericardium also thickened.    Pericarditis    a. Dx 05/2015.   Pneumothorax, spontaneous, tension 01/20/2012   October, 2010   Pulmonary fibrosis ([place]) 01/20/2012   Due to previous surgery and chest radiation for lung cancer   Restrictive lung disease    Type II diabetes mellitus [facility])    Past Surgical History:  Procedure Laterality Date   ABDOMINAL HYSTERECTOMY  10/21/2006   APPENDECTOMY  1969?   CARDIAC CATHETERIZATION N/A 07/11/2015   Procedure: Left Heart Cath and Coronary Angiography;  Surgeon: Jettie Booze, MD;  Location: [place] [facility];  Service: Cardiovascular;  Laterality: N/A;   CARDIAC CATHETERIZATION  07/11/2015   Procedure: Coronary Stent Intervention;  Surgeon: Jettie Booze, MD;  Location: [place] [facility];  Service: Cardiovascular;;   CARPAL TUNNEL RELEASE Bilateral 1998-2005?   right-left   CARPAL TUNNEL RELEASE Left 2009?   "for the 2nd time"   COLONOSCOPY      COLONOSCOPY WITH PROPOFOL N/A 05/28/2018   Procedure: COLONOSCOPY WITH PROPOFOL;  Surgeon: Milus Banister, MD;  Location: [facility];  Service: Endoscopy;  Laterality: N/A;   CORONARY ANGIOPLASTY     CYSTOSCOPY W/ STONE MANIPULATION  10/22/2011   ESOPHAGOGASTRODUODENOSCOPY (EGD) WITH ESOPHAGEAL DILATION  10/21/2010   FOOT SURGERY Bilateral 06/22/1979   Callous removed    INNER EAR SURGERY Right ~ 2009   KNEE ARTHROPLASTY Right 10/21/1989   KNEE ARTHROSCOPY  ~ 2000   LATERAL RELEASE   LEFT HEART CATH AND CORONARY ANGIOGRAPHY N/A 12/18/2016   Procedure: Left Heart Cath and Coronary Angiography;  Surgeon: Burnell Blanks, MD;  Location: [place]  [facility];  Service: Cardiovascular;  Laterality: N/A;   LEFT HEART CATH AND CORONARY ANGIOGRAPHY N/A 02/01/2021   Procedure: LEFT HEART CATH AND CORONARY ANGIOGRAPHY;  Surgeon: Leonie Man, MD;  Location: [place] [facility];  Service: Cardiovascular;  Laterality: N/A;   LUNG CANCER SURGERY Left 10/22/1999   "small cell"   LUNG SURGERY Right 10/21/2005   "reinflated it"   PULMONARY EMBOLISM SURGERY  10/21/2005   LUNG COLLAPSE   RIGHT AND LEFT HEART CATH N/A 06/25/2017   Procedure: RIGHT AND LEFT HEART CATH;  Surgeon: Sherren Mocha, MD;  Location: [place] [facility];  Service: Cardiovascular;  Laterality: N/A;   SHOULDER ADHESION RELEASE Right ~ 2004   SHOULDER ARTHROSCOPY W/ ROTATOR CUFF REPAIR Right ~ 2003   UPPER GASTROINTESTINAL ENDOSCOPY  06/05/2021   Family History  Problem Relation Age of Onset   Heart disease Father    Diabetes Father    Pancreatic cancer Mother    Diabetes Mother    Hypertension Sister        x 3   Diabetes Sister        x 4   Diabetes Maternal Grandmother    Diabetes Paternal Grandmother    Diabetes Sister    Diabetes Sister    Diabetes Sister    Kidney disease Sister    Colon cancer Neg Hx    Colon polyps Neg Hx    Rectal cancer Neg Hx    Stomach cancer Neg Hx    Esophageal cancer Neg Hx     Breast cancer Neg Hx    Social History   Socioeconomic History   Marital status: Single    Spouse name: Not on file   Number of children: 0   Years of education: Not on file   Highest education level: Not on file  Occupational History   Occupation: RETIRED  Tobacco Use   Smoking status: Former    Packs/day: 0.25    Years: 5.00    Pack years: 1.25    Types: Cigarettes    Quit date: 07/22/1999    Years since quitting: 21.9   Smokeless tobacco: Never  Vaping Use   Vaping Use: Never used  Substance and Sexual Activity   Alcohol use: No    Alcohol/week: 0.0 standard drinks   Drug use: No   Sexual activity: Yes    Birth control/protection: Other-see comments    Comment: hysterectomy  Other Topics Concern   Not on file  Social History Narrative   Not on file   Social Determinants of Health   Financial Resource Strain: Low Risk    Difficulty of Paying Living Expenses: Not hard at all  Food Insecurity: No Food Insecurity   Worried About Charity fundraiser in the Last Year: Never true   Ran Out of Food in the Last Year: Never true  Transportation Needs: No Transportation Needs   Lack of Transportation (Medical): No   Lack of Transportation (Non-Medical): No  Physical Activity: Inactive   Days of Exercise per Week: 0 days   Minutes of Exercise per Session: 0 min  Stress: No Stress Concern Present   Feeling of Stress : Not at all  Social Connections: Not on file    Tobacco Counseling Counseling given: Not Answered   Clinical Intake:  Pre-visit preparation completed: Yes  Pain : 0-10 Pain Score: 5  Pain Type: Chronic pain Pain Location: Chest Pain Descriptors / Indicators: Pressure (going to cardiologist today) Pain Frequency: Constant  Nutritional Status: BMI > 30  Obese Nutritional Risks: Nausea/ vomitting/ diarrhea Diabetes: Yes  How often do you need to have someone help you when you read instructions, pamphlets, or other written materials from  your doctor or pharmacy?: 1 - Never What is the last grade level you completed in school?: 12th grade  Diabetic? Yes Nutrition Risk Assessment:  Has the patient had any N/V/D within the last 2 months?  No  Does the patient have any non-healing wounds?  No  Has the patient had any unintentional weight loss or weight gain?  No   Diabetes:  Is the patient diabetic?  Yes  If diabetic, was a CBG obtained today?  No  Did the patient bring in their glucometer from home?  No  How often do you monitor your CBG's? Twice daily.   Financial Strains and Diabetes Management:  Are you having any financial strains with the device, your supplies or your medication? No .  Does the patient want to be seen by Chronic Care Management for management of their diabetes?  No  Would the patient like to be referred to a Nutritionist or for Diabetic Management?  No   Diabetic Exams:  Diabetic Eye Exam: Completed 08/03/2020 Diabetic Foot Exam: Completed 09/04/2020   Interpreter Needed?: No  Information entered by :: NAllen LPN   Activities of Daily Living In your present state of health, do you have any difficulty performing the following activities: 07/12/2021 02/01/2021  Hearing? Y N  Comment has appointment in October with ear doctor -  Vision? N N  Difficulty concentrating or making decisions? N N  Walking or climbing stairs? Y N  Dressing or bathing? N N  Doing errands, shopping? N -  Preparing Food and eating ? N -  Using the Toilet? N -  In the past six months, have you accidently leaked urine? Y -  Do you have problems with loss of bowel control? N -  Managing your Medications? N -  Managing your Finances? N -  Housekeeping or managing your Housekeeping? N -  Some recent data might be hidden    Patient Care Team: Glendale Chard, MD as PCP - General (Internal Medicine) Constance Haw, MD as PCP - Electrophysiology (Cardiology) Belva Crome, MD as PCP - Cardiology  (Cardiology) Fay Records, MD (Cardiology) Melida Quitter, MD Milus Banister, MD (Gastroenterology) Dorna Leitz, MD (Orthopedic Surgery) Annia Belt, MD (Hematology and Oncology) Nicanor Alcon, MD (Thoracic Surgery) Truddie Crumble, MD (Radiation Oncology) Clent Jacks, MD as Consulting Physician (Ophthalmology) Jacelyn Pi, MD as Consulting Physician (Endocrinology)  Indicate any recent Medical Services you may have received from other than [facility] providers in the past year (date may be approximate).     Assessment:   This is a routine wellness examination for [place].  Hearing/Vision screen No results found.  Dietary issues and exercise activities discussed: Current Exercise Habits: The patient does not participate in regular exercise at present   Goals Addressed             This Visit's Progress    Patient Stated       07/12/2021, no goals       Depression Screen PHQ 2/9 Scores 07/12/2021 07/06/2020 01/25/2020 09/01/2019 06/30/2019 09/23/2018 08/26/2018  PHQ - 2 Score 0 0 0 0 0 0 0  PHQ- 9 Score - - - - 3 - -    Fall Risk Fall Risk  07/12/2021 07/06/2020 01/25/2020 09/01/2019 06/30/2019  Falls  in the past year? 0 0 0 0 0  Number falls in past yr: - - - - -  Risk for fall due to : Medication side effect Medication side effect - - Medication side effect  Follow up Falls evaluation completed;Education provided;Falls prevention discussed Falls evaluation completed;Education provided;Falls prevention discussed - - Falls evaluation completed;Education provided;Falls prevention discussed    FALL RISK PREVENTION PERTAINING TO THE HOME:  Any stairs in or around the home? No  If so, are there any without handrails? N/a Home free of loose throw rugs in walkways, pet beds, electrical cords, etc? Yes  Adequate lighting in your home to reduce risk of falls? Yes   ASSISTIVE DEVICES UTILIZED TO PREVENT FALLS:  Life alert? No  Use of a cane, walker or w/c? No  Grab  bars in the bathroom? Yes  Shower chair or bench in shower? No  Elevated toilet seat or a handicapped toilet? No   TIMED UP AND GO:  Was the test performed? No .    Gait steady and fast without use of assistive device  Cognitive Function:     6CIT Screen 07/12/2021 07/06/2020 06/30/2019 08/26/2018  What Year? 0 points 0 points 0 points 0 points  What month? 0 points 0 points 0 points 0 points  What time? 3 points 0 points 0 points 0 points  Count back from 20 0 points 0 points 0 points 0 points  Months in reverse 2 points 2 points 0 points 0 points  Repeat phrase 6 points 4 points 2 points 0 points  Total Score _0 0    Immunizations Immunization History  Administered Date(s) Administered   Influenza Split 08/14/2020   Influenza Whole 07/21/2009, 07/29/2010   Influenza,inj,Quad PF,6+ Mos 11/23/2015, 06/16/2019   Influenza-Unspecified 08/14/2016, 06/16/2019   Moderna SARS-COV2 Booster Vaccination 09/24/2020, 03/20/2021   PFIZER(Purple Top)SARS-COV-2 Vaccination 12/24/2019, 01/19/2020   PPD Test 06/19/2013   Pneumococcal Conjugate-13 04/18/2015   Pneumococcal Polysaccharide-23 07/29/2010, 06/16/2019, 06/16/2019   Tdap 09/24/2020   Zoster Recombinat (Shingrix) 12/09/2019, 02/14/2020    TDAP status: Up to date  Flu Vaccine status: Due, Education has been provided regarding the importance of this vaccine. Advised may receive this vaccine at local pharmacy or Health Dept. Aware to provide a copy of the vaccination record if obtained from local pharmacy or Health Dept. Verbalized acceptance and understanding.  Pneumococcal vaccine status: Up to date  Covid-19 vaccine status: Completed vaccines  Qualifies for Shingles Vaccine? Yes   Zostavax completed No   Shingrix Completed?: Yes  Screening Tests Health Maintenance  Topic Date Due   HEMOGLOBIN A1C  03/08/2021   INFLUENZA VACCINE  05/21/2021   COVID-19 Vaccine (5 - Booster) 07/20/2021   OPHTHALMOLOGY EXAM  08/03/2021    FOOT EXAM  09/04/2021   MAMMOGRAM  02/06/2023   COLONOSCOPY (Pts 45-19yr Insurance coverage will need to be confirmed)  05/28/2028   TETANUS/TDAP  09/24/2030   Hepatitis C Screening  Completed   HIV Screening  Completed   Zoster Vaccines- Shingrix  Completed   HPV VACCINES  Aged Out   PAP SMEAR-Modifier  Discontinued    Health Maintenance  Health Maintenance Due  Topic Date Due   HEMOGLOBIN A1C  03/08/2021   INFLUENZA VACCINE  05/21/2021    Colorectal cancer screening: Type of screening: Colonoscopy. Completed 05/28/2018. Repeat every 10 years  Mammogram status: Completed 02/05/2021. Repeat every year  Bone Density status: n/a  Lung Cancer Screening: (Low Dose CT Chest recommended if Age 63-80  years, 30 pack-year currently smoking OR have quit w/in 15years.) does not qualify.   Lung Cancer Screening Referral: no  Additional Screening:  Hepatitis C Screening: does qualify; Completed 07/11/2015  Vision Screening: Recommended annual ophthalmology exams for early detection of glaucoma and other disorders of the eye. Is the patient up to date with their annual eye exam?  Yes  Who is the provider or what is the name of the office in which the patient attends annual eye exams? Dr. Katy Fitch  If pt is not established with a provider, would they like to be referred to a provider to establish care? No .   Dental Screening: Recommended annual dental exams for proper oral hygiene  Community Resource Referral / Chronic Care Management: CRR required this visit?  No   CCM required this visit?  No      Plan:     I have personally reviewed and noted the following in the patient's chart:   Medical and social history Use of alcohol, tobacco or illicit drugs  Current medications and supplements including opioid prescriptions.  Functional ability and status Nutritional status Physical activity Advanced directives List of other physicians Hospitalizations, surgeries, and ER visits  in previous 12 months Vitals Screenings to include cognitive, depression, and falls Referrals and appointments  In addition, I have reviewed and discussed with patient certain preventive protocols, quality metrics, and best practice recommendations. A written personalized care plan for preventive services as well as general preventive health recommendations were provided to patient.     Kellie Simmering, LPN   [id number]/64/1583   Nurse Notes:

## 2021-07-12 NOTE — Addendum Note (Signed)
Addended by: Loren Racer on: 07/12/2021 03:43 PM   Modules accepted: Orders

## 2021-07-13 ENCOUNTER — Telehealth: Payer: Self-pay

## 2021-07-13 LAB — ANTI-JO 1 ANTIBODY, IGG: Anti JO-1: <0.2 AI (ref 0.0–0.9)

## 2021-07-13 LAB — ANA: Anti Nuclear Antibody (ANA): NEGATIVE

## 2021-07-13 LAB — RHEUMATOID FACTOR: Rheumatoid fact SerPl-aCnc: <10 IU/mL (ref <14.0)

## 2021-07-13 LAB — EXTRACTABLE NUCLEAR ANTIGEN ANTIBODY
ENA RNP Ab: 0.3 AI (ref 0.0–0.9)
ENA SM Ab Ser-aCnc: <0.2 AI (ref 0.0–0.9)
ENA SSA (RO) Ab: <0.2 AI (ref 0.0–0.9)
ENA SSB (LA) Ab: <0.2 AI (ref 0.0–0.9)
Scleroderma (Scl-70) (ENA) Antibody, IgG: <0.2 AI (ref 0.0–0.9)
dsDNA Ab: <1 IU/mL (ref 0–9)

## 2021-07-13 LAB — SEDIMENTATION RATE: Sed Rate: 22 mm/hr (ref 0–40)

## 2021-07-13 LAB — C-REACTIVE PROTEIN: CRP: 2 mg/L (ref 0–10)

## 2021-07-13 NOTE — Telephone Encounter (Signed)
[facility] pharmacy stating that pt was prescribed colchicine 0.6 mg tablet and there is a contraindicated drug with pravastatin and diltiazem. There is a concurrent use of the statin drugs (pravastatin) and colchicine may increased the risk of myopathy or rhabdomyolysis, which characterized by progressive muscle weakness and pain in the presence of a normal neurological exam. Concurrent use of diltiazem and colchicine may result in elevated levels of and toxicity from colchicine. Symptoms of colchicine toxicity include muscle weakness or pain, numbness or tingling in the fingers or toes, myelosuppression. Would Dr. Tamala Julian still like to prescribe colchicine? Please address

## 2021-07-13 NOTE — Telephone Encounter (Signed)
I received a cc'ed chart on this yesterday from Dr Tamala Julian regarding any drug interactions and responded back that colchicine dose should be decreased to 0.3mg  every other day due to drug interaction with diltiazem (drug interaction with pravastatin is minor as pt is on a low statin dose).

## 2021-07-16 MED ORDER — COLCHICINE 0.6 MG PO TABS: 0.3000 mg | ORAL_TABLET | ORAL | 0 refills | Status: DC

## 2021-07-16 NOTE — Addendum Note (Signed)
Addended by: Loren Racer on: 07/16/2021 09:48 AM   Modules accepted: Orders

## 2021-07-16 NOTE — Telephone Encounter (Signed)
Spoke with pt and reviewed recommendations.  Pt agreeable to plan.

## 2021-07-24 DIAGNOSIS — Z794 Long term (current) use of insulin: Secondary | ICD-10-CM | POA: Diagnosis not present

## 2021-07-24 DIAGNOSIS — E1136 Type 2 diabetes mellitus with diabetic cataract: Secondary | ICD-10-CM | POA: Diagnosis not present

## 2021-07-24 DIAGNOSIS — Z7985 Long-term (current) use of injectable non-insulin antidiabetic drugs: Secondary | ICD-10-CM | POA: Diagnosis not present

## 2021-07-24 DIAGNOSIS — H2513 Age-related nuclear cataract, bilateral: Secondary | ICD-10-CM | POA: Diagnosis not present

## 2021-07-24 DIAGNOSIS — H35033 Hypertensive retinopathy, bilateral: Secondary | ICD-10-CM | POA: Diagnosis not present

## 2021-07-24 DIAGNOSIS — H15002 Unspecified scleritis, left eye: Secondary | ICD-10-CM | POA: Diagnosis not present

## 2021-07-24 DIAGNOSIS — Z79899 Other long term (current) drug therapy: Secondary | ICD-10-CM | POA: Diagnosis not present

## 2021-07-24 DIAGNOSIS — H15102 Unspecified episcleritis, left eye: Secondary | ICD-10-CM | POA: Diagnosis not present

## 2021-07-25 ENCOUNTER — Telehealth: Payer: Self-pay | Admitting: Pulmonary Disease

## 2021-07-25 ENCOUNTER — Other Ambulatory Visit: Payer: Self-pay

## 2021-07-25 ENCOUNTER — Encounter: Payer: Self-pay | Admitting: Pulmonary Disease

## 2021-07-25 ENCOUNTER — Ambulatory Visit: Payer: Medicare PPO | Admitting: Pulmonary Disease

## 2021-07-25 DIAGNOSIS — R042 Hemoptysis: Secondary | ICD-10-CM | POA: Diagnosis not present

## 2021-07-25 DIAGNOSIS — G4733 Obstructive sleep apnea (adult) (pediatric): Secondary | ICD-10-CM | POA: Diagnosis not present

## 2021-07-25 NOTE — Assessment & Plan Note (Signed)
OSA is very mild on home sleep test as well as on polysomnogram.  Doubt that this is having any cardiovascular effects she denies symptoms of daytime sleepiness and as such I did not feel the need to proceed with CPAP titration, she is not very enthusiastic about starting on CPAP either.  Will let cardiology know

## 2021-07-25 NOTE — Telephone Encounter (Signed)
Please schedule the following:   Provider performing procedure:[name]  Diagnosis: hemoptysis  Which side if for nodule / mass? NA  Procedure: video (flex) bronchoscopy under propofol  Has patient been spoken to by Provider and given informed consent? yes  Anesthesia: propofol / conscious sedation  Do you need Fluro? NO  Duration of procedure: 30 mins  Date: scheduled for 10/14  Alternate Date: NA  Time: 8 am Location: [facility]  Does patient have OSA? Y DM? N Or Latex allergy? N  Medication Restriction: NA  Anticoagulate/Antiplatelet: HOLD ELIQUIS FOR 2 DAYS PTA _PL LET PT KNOW  Pre-op Labs Ordered:determined by Anesthesia  Imaging request: NA    Please coordinate Pre-op COVID Testing

## 2021-07-25 NOTE — Progress Notes (Signed)
Subjective:    Patient ID: [name], female    DOB: 05-26-1956, 65 y.o.   MRN: [id number]  HPI 65 yo never smoker for FU of dyspnea attributed to restrictive lung disease   PMH -  LUlobectomy for spont pneumothx in 07/2006, s/p chemo/ XRT for stg III B lung cancer in 2001 - in remission since!   - chronic pericarditis since 2016, has received NSAIDs, colchicine and steroids, was referred to [facility]. -Episcleritis on Imuran -CAD -cardiac cath 06/2015 required 2 stents, repeat 06/2017,  moderate aortic regurgitation -Paroxysmal atrial fibrillation - EGD 11/2017 which showed mild narrowing of the mid esophagus without mass  She presents complaining of hemoptysis starting March 2022.  This is trace bloodstained sputum when she clears her throat after brushing her teeth.  This occurs about twice a week. She had ENT evaluation laryngoscopy did not show any source of bleeding Seen by GI as well, 05/2021 underwent endoscopy with no source of bleeding . Esophageal dilation done with mild stenosis noted in proximal esophagus . 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  Was seen in the emergency room June 14, 2021 with A. fib RVR  Will have reviewed cardiology consultation with Dr. Tamala Julian, she also reported intermittent chest pain and due to prior history of pericarditis was started on colchicine.  Sleep study was repeated which only showed mild OSA with AHI 8/hour CPAP titration was scheduled. Autoimmune work-up was obtained which is all negative serology  Significant tests/ events reviewed  CT chest wo con 06/2021 >> treatment changes in the left apex with scarring and bronchiectasis similar to previous exam, residual changes of lung resection on the right CT chest with contrast 01/2020  >>esophageal air fluid level suggests dysmotility or gastroesophageal reflux   02/2020 HST AHI 6/h 8/202022 NPSG AHI 8/hour  Past Medical History:  Diagnosis Date   Allergy    Anemia    Anemia    Aortic  insufficiency    a. Mod by echo 05/2015.   Arthritis    "knees" (07/11/2015)   Atrial fibrillation (HCC)    On Eliquis   Carcinoma of hilus of lung ([place]) 01/20/2012   IIIB NSCL  Right hilar mass compressing esophagus/presenting with dysphagia Rx Surgery/RT/chemo dx January 9563   Complication of anesthesia    "I had a hard time waking me up w/one of my shoulder surgeries"   Coronary artery disease    a. Abnormal stress test -> LHc 06/2015 s/p DES to mCX and mLAD, residual D2 disease treated medially.   Diverticulitis    Elevated blood pressure    Erythema nodosum    Heart murmur    Insomnia    lung ca dx'd 10/1999   chemo/xrt comp 05/29/2000   Myalgia    Nephrolithiasis 2015   Obesity    Pericardial effusion    a. 05/2015 Echo: EF 55-60%, no rwma, Gr 1 DD, no effusion - but an effusion was seen on CT which was felt to be increased in size compared to 12/2014 - pericardium also thickened.    Pericarditis    a. Dx 05/2015.   Pneumothorax, spontaneous, tension 01/20/2012   October, 2010   Pulmonary fibrosis ([place]) 01/20/2012   Due to previous surgery and chest radiation for lung cancer   Restrictive lung disease    Type II diabetes mellitus ([place])       Review of Systems neg for any significant sore throat, dysphagia, itching, sneezing, nasal congestion or excess/ purulent secretions, fever,  chills, sweats, unintended wt loss, pleuritic or exertional cp, hempoptysis, orthopnea pnd or change in chronic leg swelling. Also denies presyncope, palpitations, heartburn, abdominal pain, nausea, vomiting, diarrhea or change in bowel or urinary habits, dysuria,hematuria, rash, arthralgias, visual complaints, headache, numbness weakness or ataxia.     Objective:   Physical Exam   Gen. Pleasant, obese, in no distress ENT - no lesions, no post nasal drip Neck: No JVD, no thyromegaly, no carotid bruits Lungs: no use of accessory muscles, no dullness to percussion, decreased without rales or rhonchi   Cardiovascular: Rhythm regular, heart sounds  normal, no murmurs or gallops, no peripheral edema Musculoskeletal: No deformities, no cyanosis or clubbing , no tremors        Assessment & Plan:

## 2021-07-25 NOTE — H&P (View-Only) (Signed)
Subjective:    Patient ID: [name], female    DOB: 14-Apr-1956, 65 y.o.   MRN: [id number]  HPI 65 yo never smoker for FU of dyspnea attributed to restrictive lung disease   PMH -  LUlobectomy for spont pneumothx in 07/2006, s/p chemo/ XRT for stg III B lung cancer in 2001 - in remission since!   - chronic pericarditis since 2016, has received NSAIDs, colchicine and steroids, was referred to [facility]. -Episcleritis on Imuran -CAD -cardiac cath 06/2015 required 2 stents, repeat 06/2017,  moderate aortic regurgitation -Paroxysmal atrial fibrillation - EGD 11/2017 which showed mild narrowing of the mid esophagus without mass  She presents complaining of hemoptysis starting March 2022.  This is trace bloodstained sputum when she clears her throat after brushing her teeth.  This occurs about twice a week. She had ENT evaluation laryngoscopy did not show any source of bleeding Seen by GI as well, 05/2021 underwent endoscopy with no source of bleeding . Esophageal dilation done with mild stenosis noted in proximal esophagus . 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  Was seen in the emergency room June 14, 2021 with A. fib RVR  Will have reviewed cardiology consultation with Dr. Tamala Julian, she also reported intermittent chest pain and due to prior history of pericarditis was started on colchicine.  Sleep study was repeated which only showed mild OSA with AHI 8/hour CPAP titration was scheduled. Autoimmune work-up was obtained which is all negative serology  Significant tests/ events reviewed  CT chest wo con 06/2021 >> treatment changes in the left apex with scarring and bronchiectasis similar to previous exam, residual changes of lung resection on the right CT chest with contrast 01/2020  >>esophageal air fluid level suggests dysmotility or gastroesophageal reflux   02/2020 HST AHI 6/h 8/202022 NPSG AHI 8/hour  Past Medical History:  Diagnosis Date   Allergy    Anemia    Anemia    Aortic  insufficiency    a. Mod by echo 05/2015.   Arthritis    "knees" (07/11/2015)   Atrial fibrillation (HCC)    On Eliquis   Carcinoma of hilus of lung ([place]) 01/20/2012   IIIB NSCL  Right hilar mass compressing esophagus/presenting with dysphagia Rx Surgery/RT/chemo dx January 4008   Complication of anesthesia    "I had a hard time waking me up w/one of my shoulder surgeries"   Coronary artery disease    a. Abnormal stress test -> LHc 06/2015 s/p DES to mCX and mLAD, residual D2 disease treated medially.   Diverticulitis    Elevated blood pressure    Erythema nodosum    Heart murmur    Insomnia    lung ca dx'd 10/1999   chemo/xrt comp 05/29/2000   Myalgia    Nephrolithiasis 2015   Obesity    Pericardial effusion    a. 05/2015 Echo: EF 55-60%, no rwma, Gr 1 DD, no effusion - but an effusion was seen on CT which was felt to be increased in size compared to 12/2014 - pericardium also thickened.    Pericarditis    a. Dx 05/2015.   Pneumothorax, spontaneous, tension 01/20/2012   October, 2010   Pulmonary fibrosis ([place]) 01/20/2012   Due to previous surgery and chest radiation for lung cancer   Restrictive lung disease    Type II diabetes mellitus ([place])       Review of Systems neg for any significant sore throat, dysphagia, itching, sneezing, nasal congestion or excess/ purulent secretions, fever,  chills, sweats, unintended wt loss, pleuritic or exertional cp, hempoptysis, orthopnea pnd or change in chronic leg swelling. Also denies presyncope, palpitations, heartburn, abdominal pain, nausea, vomiting, diarrhea or change in bowel or urinary habits, dysuria,hematuria, rash, arthralgias, visual complaints, headache, numbness weakness or ataxia.     Objective:   Physical Exam   Gen. Pleasant, obese, in no distress ENT - no lesions, no post nasal drip Neck: No JVD, no thyromegaly, no carotid bruits Lungs: no use of accessory muscles, no dullness to percussion, decreased without rales or rhonchi   Cardiovascular: Rhythm regular, heart sounds  normal, no murmurs or gallops, no peripheral edema Musculoskeletal: No deformities, no cyanosis or clubbing , no tremors        Assessment & Plan:

## 2021-07-25 NOTE — Telephone Encounter (Signed)
Called and spoke with patient to let her know of procedure information and covid test information. She expressed understanding.    Procedure is 08/03/21 at [facility]. Patient advised to hold Eliquis 2 days prior. for the COVID - it will be on Tuesday, 07/31/21 between 8 AM & 3 PM at the [place] [facility] at [facility] - [address] [place], [place], ph# [phone]. Next to the [facility], follow signs to the loading dock - blue tent set up underneath the overhang on the back of the building.

## 2021-07-25 NOTE — Assessment & Plan Note (Signed)
By history this seems to be emanating from the upper airway, surprisingly ENT evaluation and EGD has been negative.  I feel that this is somehow related to Eliquis unfortunately she does need this for atrial fibrillation.  Fortunately hemoptysis is only been very mild so far, ongoing for last 6 months. CT chest did not show any new changes which is reassuring.  She is a lung cancer survivor.  I think we will proceed with an inspection bronchoscopy to confirm that there is no recurrent malignancy. Risks and benefits of procedure were discussed with the patient, she is willing to proceed, procedure scheduled for October 14.  Hold Eliquis for 2 days prior to procedure

## 2021-07-25 NOTE — Patient Instructions (Signed)
We will schedule bronchoscopy next week Hold eliquis for 2 days prior OK to cancel sleep study  Happy birthday !!

## 2021-07-27 DIAGNOSIS — H9071 Mixed conductive and sensorineural hearing loss, unilateral, right ear, with unrestricted hearing on the contralateral side: Secondary | ICD-10-CM | POA: Diagnosis not present

## 2021-07-30 DIAGNOSIS — M1712 Unilateral primary osteoarthritis, left knee: Secondary | ICD-10-CM | POA: Diagnosis not present

## 2021-07-30 DIAGNOSIS — M7701 Medial epicondylitis, right elbow: Secondary | ICD-10-CM | POA: Diagnosis not present

## 2021-07-30 DIAGNOSIS — M25562 Pain in left knee: Secondary | ICD-10-CM | POA: Diagnosis not present

## 2021-07-31 DIAGNOSIS — Z79624 Long term (current) use of inhibitors of nucleotide synthesis: Secondary | ICD-10-CM | POA: Diagnosis not present

## 2021-07-31 DIAGNOSIS — Z85118 Personal history of other malignant neoplasm of bronchus and lung: Secondary | ICD-10-CM | POA: Diagnosis not present

## 2021-07-31 DIAGNOSIS — Z823 Family history of stroke: Secondary | ICD-10-CM | POA: Diagnosis not present

## 2021-07-31 DIAGNOSIS — Z833 Family history of diabetes mellitus: Secondary | ICD-10-CM | POA: Diagnosis not present

## 2021-07-31 DIAGNOSIS — I25119 Atherosclerotic heart disease of native coronary artery with unspecified angina pectoris: Secondary | ICD-10-CM | POA: Diagnosis not present

## 2021-07-31 DIAGNOSIS — Z818 Family history of other mental and behavioral disorders: Secondary | ICD-10-CM | POA: Diagnosis not present

## 2021-07-31 DIAGNOSIS — E1136 Type 2 diabetes mellitus with diabetic cataract: Secondary | ICD-10-CM | POA: Diagnosis not present

## 2021-07-31 DIAGNOSIS — I110 Hypertensive heart disease with heart failure: Secondary | ICD-10-CM | POA: Diagnosis not present

## 2021-07-31 DIAGNOSIS — Z8249 Family history of ischemic heart disease and other diseases of the circulatory system: Secondary | ICD-10-CM | POA: Diagnosis not present

## 2021-07-31 DIAGNOSIS — I4891 Unspecified atrial fibrillation: Secondary | ICD-10-CM | POA: Diagnosis not present

## 2021-07-31 DIAGNOSIS — D6869 Other thrombophilia: Secondary | ICD-10-CM | POA: Diagnosis not present

## 2021-07-31 DIAGNOSIS — Z800 Family history of malignant neoplasm of digestive organs: Secondary | ICD-10-CM | POA: Diagnosis not present

## 2021-07-31 DIAGNOSIS — I509 Heart failure, unspecified: Secondary | ICD-10-CM | POA: Diagnosis not present

## 2021-07-31 DIAGNOSIS — Z794 Long term (current) use of insulin: Secondary | ICD-10-CM | POA: Diagnosis not present

## 2021-07-31 DIAGNOSIS — Z87891 Personal history of nicotine dependence: Secondary | ICD-10-CM | POA: Diagnosis not present

## 2021-07-31 DIAGNOSIS — Z825 Family history of asthma and other chronic lower respiratory diseases: Secondary | ICD-10-CM | POA: Diagnosis not present

## 2021-07-31 DIAGNOSIS — J8410 Pulmonary fibrosis, unspecified: Secondary | ICD-10-CM | POA: Diagnosis not present

## 2021-08-01 LAB — SARS CORONAVIRUS 2 (TAT 6-24 HRS): SARS Coronavirus 2: NEGATIVE

## 2021-08-02 ENCOUNTER — Other Ambulatory Visit: Payer: Self-pay

## 2021-08-02 ENCOUNTER — Encounter (HOSPITAL_COMMUNITY): Payer: Self-pay | Admitting: Pulmonary Disease

## 2021-08-02 NOTE — Anesthesia Preprocedure Evaluation (Addendum)
Anesthesia Evaluation  Patient identified by MRN, date of birth, ID band Patient awake    Reviewed: Allergy & Precautions, NPO status , Patient's Chart, lab work & pertinent test results  Airway Mallampati: II  TM Distance: >3 FB Neck ROM: Full    Dental no notable dental hx.    Pulmonary sleep apnea , former smoker,    Pulmonary exam normal breath sounds clear to auscultation       Cardiovascular hypertension, Pt. on medications + angina + CAD, + Peripheral Vascular Disease and +CHF  Normal cardiovascular exam Rhythm:Regular Rate:Normal     Neuro/Psych negative neurological ROS  negative psych ROS   GI/Hepatic negative GI ROS, Neg liver ROS,   Endo/Other  diabetes, Type 2Hypothyroidism   Renal/GU Renal InsufficiencyRenal disease  negative genitourinary   Musculoskeletal  (+) Arthritis , Osteoarthritis,    Abdominal (+) + obese,   Peds negative pediatric ROS (+)  Hematology negative hematology ROS (+)   Anesthesia Other Findings   Reproductive/Obstetrics negative OB ROS                            Anesthesia Physical Anesthesia Plan  ASA: 3  Anesthesia Plan: General   Post-op Pain Management:    Induction: Intravenous  PONV Risk Score and Plan: 3 and Ondansetron, Dexamethasone, Midazolam and Treatment may vary due to age or medical condition  Airway Management Planned: Oral ETT  Additional Equipment:   Intra-op Plan:   Post-operative Plan: Extubation in OR  Informed Consent: I have reviewed the patients History and Physical, chart, labs and discussed the procedure including the risks, benefits and alternatives for the proposed anesthesia with the patient or authorized representative who has indicated his/her understanding and acceptance.     Dental advisory given  Plan Discussed with: CRNA  Anesthesia Plan Comments: (See APP note by Durel Salts, FNP )        Anesthesia Quick Evaluation

## 2021-08-02 NOTE — Progress Notes (Signed)
PCP - Dr. Baird Cancer Cardiologist - Dr. Tamala Julian (Dr. Curt Bears - afib) EKG - 07/12/21 Chest x-ray - 06/14/21 ECHO - 07/09/21 Cardiac Cath - 02/01/21 CPAP - denies (has had sleep study)   Fasting Blood Sugar:  96-112 Checks Blood Sugar:  1x/day  Blood Thinner Instructions: Eliquis last dose 07/31/21 Aspirin Instructions:   ERAS Protcol -   COVID TEST- DOS  Anesthesia review: yes cardiac hx  -------------  SDW INSTRUCTIONS:  Your procedure is scheduled on Friday 10/14. Please report to [facility] Main Entrance "A" at 0530 A.M., and check in at the Admitting office. Call this number if you have problems the morning of surgery: [phone]   Remember: Do not eat or drink after midnight the night before your surgery   Medications to take morning of surgery with a sip of water include: azaTHIOprine (IMURAN)  diltiazem (CARDIZEM CD) dofetilide (TIKOSYN) levothyroxine (SYNTHROID, LEVOTHROID) metoprolol succinate (TOPROL-XL) pravastatin (PRAVACHOL)  If needed: Tylenol diphenhydrAMINE (BENADRYL) nitroGLYCERIN (NITROSTAT)   ** PLEASE check your blood sugar the morning of your surgery when you wake up and every 2 hours until you get to the Short Stay unit.  If your blood sugar is less than 70 mg/dL, you will need to treat for low blood sugar: Do not take insulin. Treat a low blood sugar (less than 70 mg/dL) with  cup of clear juice (cranberry or apple), 4 glucose tablets, OR glucose gel. Recheck blood sugar in 15 minutes after treatment (to make sure it is greater than 70 mg/dL). If your blood sugar is not greater than 70 mg/dL on recheck, call [phone] for further instructions.  HUMALOG MIX 75/25 KWIKPEN (75-25) 10/13: 40 units AM, 24.5 units PM 10/14: none   As of today, STOP taking any ELIQUIS, Aspirin (unless otherwise instructed by your surgeon), Aleve, Naproxen, Ibuprofen, Motrin, Advil, Goody's, BC's, all herbal medications, fish oil, and all vitamins.    The Morning  of Surgery Do not wear jewelry, make-up or nail polish. Do not wear lotions, powders, or perfumes, or deodorant  Do not bring valuables to the hospital. [facility] is not responsible for any belongings or valuables.  If you are a smoker, DO NOT Smoke 24 hours prior to surgery  If you wear a CPAP at night please bring your mask the morning of surgery   Remember that you must have someone to transport you home after your surgery, and remain with you for 24 hours if you are discharged the same day.  Please bring cases for contacts, glasses, hearing aids, dentures or bridgework because it cannot be worn into surgery.   Patients discharged the day of surgery will not be allowed to drive home.   Please shower the NIGHT BEFORE/MORNING OF SURGERY (use antibacterial soap like DIAL soap if possible). Wear comfortable clothes the morning of surgery. Oral Hygiene is also important to reduce your risk of infection.  Remember - BRUSH YOUR TEETH THE MORNING OF SURGERY WITH YOUR REGULAR TOOTHPASTE  Patient denies shortness of breath, fever, cough and chest pain.

## 2021-08-02 NOTE — Progress Notes (Signed)
Anesthesia Chart Review:  Pt is a same day work up    Case: [id number] Date/Time: 08/03/21 0815   Procedure: VIDEO BRONCHOSCOPY WITHOUT FLUORO   Anesthesia type: General   Pre-op diagnosis: HEMOTYSIS   Location: [place] [facility] [place] [facility]   Surgeons: Rigoberto Noel, MD       DISCUSSION: Pt is 65 years old with hx CAD (DES to [facility] and mLAD 2016), chronic pericarditis with chronic pericardial rub (treated intermittently with steroids and colchicine), PAF, chronic diastolic HF, aortic insufficiency, HTN, DM, pulmonary fibrosis, non-small cell lung cancer (s/p LU lobectomy 2007), COPD, anemia  Pt to hold eliquis 2 days before procedure.     PROVIDERS: - PCP is Glendale Chard, MD - Cardiologist is Daneen Schick, MD. Last office visit 07/12/21 - Pulmonologist is Kara Mead, MD   LABS: Labs reviewed: Acceptable for surgery. - CBC w/diff and CMP 07/24/21 are acceptable for surgery   IMAGES: CT chest 06/29/21:  - No acute findings. - Post treatment changes in the LEFT lung apex and scarring at the LEFT base similar to prior exams. Findings suggest prior radiation in this location. - Post partial lung resection in the RIGHT chest as before. - Signs of partial lung resection, wedge resections in the RIGHT chest and along the major fissure in the RIGHT chest also unchanged. - Aortic Atherosclerosis  CXR 06/14/21:  - No acute abnormality noted. - Chronic changes as described above.   EKG 07/12/21: normal sinus rhythm, J-point elevation in lead I, II, III, aVF, V3 through V6.  This is unchanged from the EKG performed 06/15/2021   CV: Echo 07/09/21:  1. Left ventricular ejection fraction, by estimation, is 60 to 65%. The left ventricle has normal function. The left ventricle has no regional wall motion abnormalities. There is mild left ventricular hypertrophy. Left ventricular diastolic parameters are consistent with Grade II diastolic dysfunction (pseudonormalization).   2. Right ventricular  systolic function is normal. The right ventricular size is normal. There is normal pulmonary artery systolic pressure. The estimated right ventricular systolic pressure is 72.5 mmHg.   3. Left atrial size was mildly dilated.   4. Right atrial size was mildly dilated.   5. A small pericardial effusion is present. No evidence for tamponade. No evidence for effusive/constrictive pericarditis (normal IVC size, no respirophasic variation of the interventricular septal position).   6. The mitral valve is normal in structure. No evidence of mitral valve regurgitation. No evidence of mitral stenosis.   7. The aortic valve is tricuspid. Aortic valve regurgitation is moderate. No aortic stenosis is present.   8. The inferior vena cava is normal in size with greater than 50% respiratory variability, suggesting right atrial pressure of 3 mmHg.    Cardiac cath 02/01/21:  The left ventricular systolic function is normal. The left ventricular ejection fraction is 55-65% by visual estimate. LV end diastolic pressure is normal. Prox LAD lesion is 30% stenosed just prior to long stent Prox LAD to Mid LAD long DES stent (20m) is 5% stenosed. The jailed 1st Diag lesion is 50% stenosed. Prox Cx to Mid Cx DES Stent is 20% stenosed. 1st Mrg lesion is 20% stenosed. SUMMARY Left Dominant System with Two-Vessel Disease: Proximal LAD focal 20 to 30% calcified nodule just prior to a long stent that is widely patent.  The jailed diagonal branch has 60% stenosis.  LAD is wraparound perfuses the distal third of the PDA territory Large dominant LCx with 1 major OM 1, smaller OM  2 before going to the AV groove to give a PL 1 and PDA.  Just after OM1 there is a widely patent stent Preserved LVEF with normal EDP.   Past Medical History:  Diagnosis Date   Allergy    Anemia    Anemia    Aortic insufficiency    a. Mod by echo 05/2015.   Arthritis    "knees" (07/11/2015)   Atrial fibrillation (HCC)    On Eliquis    Carcinoma of hilus of lung ([place]) 01/20/2012   IIIB NSCL  Right hilar mass compressing esophagus/presenting with dysphagia Rx Surgery/RT/chemo dx January 2620   Complication of anesthesia    "I had a hard time waking me up w/one of my shoulder surgeries"   Coronary artery disease    a. Abnormal stress test -> LHc 06/2015 s/p DES to mCX and mLAD, residual D2 disease treated medially.   Diverticulitis    Elevated blood pressure    Erythema nodosum    Heart murmur    Insomnia    lung ca dx'd 10/1999   chemo/xrt comp 05/29/2000   Myalgia    Nephrolithiasis 2015   Obesity    Pericardial effusion    a. 05/2015 Echo: EF 55-60%, no rwma, Gr 1 DD, no effusion - but an effusion was seen on CT which was felt to be increased in size compared to 12/2014 - pericardium also thickened.    Pericarditis    a. Dx 05/2015.   Pneumothorax, spontaneous, tension 01/20/2012   October, 2010   Pulmonary fibrosis ([place]) 01/20/2012   Due to previous surgery and chest radiation for lung cancer   Restrictive lung disease    Type II diabetes mellitus [facility])     Past Surgical History:  Procedure Laterality Date   ABDOMINAL HYSTERECTOMY  10/21/2006   APPENDECTOMY  1969?   CARDIAC CATHETERIZATION N/A 07/11/2015   Procedure: Left Heart Cath and Coronary Angiography;  Surgeon: Jettie Booze, MD;  Location: [place] [facility];  Service: Cardiovascular;  Laterality: N/A;   CARDIAC CATHETERIZATION  07/11/2015   Procedure: Coronary Stent Intervention;  Surgeon: Jettie Booze, MD;  Location: [place] [facility];  Service: Cardiovascular;;   CARPAL TUNNEL RELEASE Bilateral 1998-2005?   right-left   CARPAL TUNNEL RELEASE Left 2009?   "for the 2nd time"   COLONOSCOPY     COLONOSCOPY WITH PROPOFOL N/A 05/28/2018   Procedure: COLONOSCOPY WITH PROPOFOL;  Surgeon: Milus Banister, MD;  Location: [facility];  Service: Endoscopy;  Laterality: N/A;   CORONARY ANGIOPLASTY     CYSTOSCOPY W/ STONE MANIPULATION  10/22/2011    ESOPHAGOGASTRODUODENOSCOPY (EGD) WITH ESOPHAGEAL DILATION  10/21/2010   FOOT SURGERY Bilateral 06/22/1979   Callous removed    INNER EAR SURGERY Right ~ 2009   KNEE ARTHROPLASTY Right 10/21/1989   KNEE ARTHROSCOPY  ~ 2000   LATERAL RELEASE   LEFT HEART CATH AND CORONARY ANGIOGRAPHY N/A 12/18/2016   Procedure: Left Heart Cath and Coronary Angiography;  Surgeon: Burnell Blanks, MD;  Location: [place] [facility];  Service: Cardiovascular;  Laterality: N/A;   LEFT HEART CATH AND CORONARY ANGIOGRAPHY N/A 02/01/2021   Procedure: LEFT HEART CATH AND CORONARY ANGIOGRAPHY;  Surgeon: Leonie Man, MD;  Location: [place] [facility];  Service: Cardiovascular;  Laterality: N/A;   LUNG CANCER SURGERY Left 10/22/1999   "small cell"   LUNG SURGERY Right 10/21/2005   "reinflated it"   PULMONARY EMBOLISM SURGERY  10/21/2005   LUNG COLLAPSE  RIGHT AND LEFT HEART CATH N/A 06/25/2017   Procedure: RIGHT AND LEFT HEART CATH;  Surgeon: Sherren Mocha, MD;  Location: [place] [facility];  Service: Cardiovascular;  Laterality: N/A;   SHOULDER ADHESION RELEASE Right ~ 2004   SHOULDER ARTHROSCOPY W/ ROTATOR CUFF REPAIR Right ~ 2003   UPPER GASTROINTESTINAL ENDOSCOPY  06/05/2021    MEDICATIONS: No current facility-administered medications for this encounter.    acetaminophen (TYLENOL) 500 MG tablet   azaTHIOprine (IMURAN) 50 MG tablet   co-enzyme Q-10 50 MG capsule   colchicine 0.6 MG tablet   diltiazem (CARDIZEM CD) 360 MG 24 hr capsule   diphenhydrAMINE (BENADRYL) 25 MG tablet   diphenhydramine-acetaminophen (TYLENOL PM) 25-500 MG TABS tablet   dofetilide (TIKOSYN) 500 MCG capsule   ELIQUIS 5 MG TABS tablet   folic acid (FOLVITE) 1 MG tablet   furosemide (LASIX) 40 MG tablet   HUMALOG MIX 75/25 KWIKPEN (75-25) 100 UNIT/ML Kwikpen   levothyroxine (SYNTHROID, LEVOTHROID) 50 MCG tablet   losartan (COZAAR) 25 MG tablet   metoprolol succinate (TOPROL-XL) 50 MG 24 hr tablet   nitroGLYCERIN  (NITROSTAT) 0.4 MG SL tablet   pravastatin (PRAVACHOL) 20 MG tablet   spironolactone (ALDACTONE) 25 MG tablet   Accu-Chek Softclix Lancets lancets   B-D UF III MINI PEN NEEDLES 31G X 5 MM MISC   Blood Glucose Monitoring Suppl (ACCU-CHEK GUIDE) w/Device KIT   meclizine (ANTIVERT) 12.5 MG tablet   OZEMPIC, 0.25 OR 0.5 MG/DOSE, 2 MG/1.5ML SOPN   - Pt to stop eliquis 2 days before procedure.    If no changes, I anticipate pt can proceed with surgery as scheduled.   Willeen Cass, PhD, FNP-BC [facility] Short Stay Surgical Center/Anesthesiology Phone: [phone] 08/02/2021 9:37 AM

## 2021-08-03 ENCOUNTER — Encounter (HOSPITAL_COMMUNITY): Payer: Self-pay | Admitting: Pulmonary Disease

## 2021-08-03 ENCOUNTER — Encounter (HOSPITAL_COMMUNITY)
Admission: RE | Disposition: A | Discharge status: Home / Self Care | Payer: Self-pay | Source: Home / Self Care | Attending: Pulmonary Disease

## 2021-08-03 ENCOUNTER — Other Ambulatory Visit: Payer: Self-pay

## 2021-08-03 ENCOUNTER — Ambulatory Visit (HOSPITAL_COMMUNITY): Payer: Medicare PPO | Admitting: Emergency Medicine

## 2021-08-03 ENCOUNTER — Ambulatory Visit (HOSPITAL_COMMUNITY)
Admission: RE | Admit: 2021-08-03 | Discharge: 2021-08-03 | Disposition: A | Discharge status: Home / Self Care | Payer: Medicare PPO | Attending: Pulmonary Disease | Admitting: Pulmonary Disease

## 2021-08-03 DIAGNOSIS — Z923 Personal history of irradiation: Secondary | ICD-10-CM | POA: Diagnosis not present

## 2021-08-03 DIAGNOSIS — Z85118 Personal history of other malignant neoplasm of bronchus and lung: Secondary | ICD-10-CM | POA: Insufficient documentation

## 2021-08-03 DIAGNOSIS — I319 Disease of pericardium, unspecified: Secondary | ICD-10-CM | POA: Diagnosis not present

## 2021-08-03 DIAGNOSIS — Z955 Presence of coronary angioplasty implant and graft: Secondary | ICD-10-CM | POA: Diagnosis not present

## 2021-08-03 DIAGNOSIS — I480 Paroxysmal atrial fibrillation: Secondary | ICD-10-CM | POA: Diagnosis not present

## 2021-08-03 DIAGNOSIS — Z79899 Other long term (current) drug therapy: Secondary | ICD-10-CM | POA: Insufficient documentation

## 2021-08-03 DIAGNOSIS — E119 Type 2 diabetes mellitus without complications: Secondary | ICD-10-CM | POA: Diagnosis not present

## 2021-08-03 DIAGNOSIS — Z87891 Personal history of nicotine dependence: Secondary | ICD-10-CM | POA: Diagnosis not present

## 2021-08-03 DIAGNOSIS — Z9221 Personal history of antineoplastic chemotherapy: Secondary | ICD-10-CM | POA: Diagnosis not present

## 2021-08-03 DIAGNOSIS — Z7901 Long term (current) use of anticoagulants: Secondary | ICD-10-CM | POA: Insufficient documentation

## 2021-08-03 DIAGNOSIS — I2510 Atherosclerotic heart disease of native coronary artery without angina pectoris: Secondary | ICD-10-CM | POA: Insufficient documentation

## 2021-08-03 DIAGNOSIS — Z20822 Contact with and (suspected) exposure to covid-19: Secondary | ICD-10-CM | POA: Diagnosis not present

## 2021-08-03 DIAGNOSIS — R042 Hemoptysis: Secondary | ICD-10-CM | POA: Diagnosis not present

## 2021-08-03 DIAGNOSIS — H15109 Unspecified episcleritis, unspecified eye: Secondary | ICD-10-CM | POA: Diagnosis not present

## 2021-08-03 DIAGNOSIS — G4700 Insomnia, unspecified: Secondary | ICD-10-CM | POA: Diagnosis not present

## 2021-08-03 DIAGNOSIS — G4733 Obstructive sleep apnea (adult) (pediatric): Secondary | ICD-10-CM | POA: Diagnosis not present

## 2021-08-03 HISTORY — PX: BRONCHIAL WASHINGS: SHX5105

## 2021-08-03 HISTORY — PX: VIDEO BRONCHOSCOPY: SHX5072

## 2021-08-03 LAB — GLUCOSE, CAPILLARY: Glucose-Capillary: 161 mg/dL — ABNORMAL HIGH (ref 70–99)

## 2021-08-03 LAB — SARS CORONAVIRUS 2 BY RT PCR (HOSPITAL ORDER, PERFORMED IN ~~LOC~~ HOSPITAL LAB): SARS Coronavirus 2: NEGATIVE

## 2021-08-03 SURGERY — VIDEO BRONCHOSCOPY WITHOUT FLUORO
Anesthesia: General

## 2021-08-03 MED ORDER — LACTATED RINGERS IV SOLN: INTRAVENOUS | Status: DC

## 2021-08-03 MED ORDER — ONDANSETRON HCL 4 MG/2ML IJ SOLN
INTRAMUSCULAR | Status: DC | PRN
  Administered 2021-08-03: 4 mg via INTRAVENOUS

## 2021-08-03 MED ORDER — HYDROMORPHONE HCL 1 MG/ML IJ SOLN: 0.2500 mg | INTRAMUSCULAR | Status: DC | PRN

## 2021-08-03 MED ORDER — CHLORHEXIDINE GLUCONATE 0.12 % MT SOLN
15.0000 mL | Freq: Once | OROMUCOSAL | Status: AC
  Filled 2021-08-03: qty 15

## 2021-08-03 MED ORDER — LIDOCAINE HCL URETHRAL/MUCOSAL 2 % EX GEL
1.0000 "application " | Freq: Once | CUTANEOUS | Status: AC
  Administered 2021-08-03: 1 via TOPICAL

## 2021-08-03 MED ORDER — LACTATED RINGERS IV SOLN: INTRAVENOUS | Status: DC | PRN

## 2021-08-03 MED ORDER — LIDOCAINE HCL URETHRAL/MUCOSAL 2 % EX GEL
CUTANEOUS | Status: AC
  Filled 2021-08-03: qty 6

## 2021-08-03 MED ORDER — AMISULPRIDE (ANTIEMETIC) 5 MG/2ML IV SOLN: 10.0000 mg | Freq: Once | INTRAVENOUS | Status: DC | PRN

## 2021-08-03 MED ORDER — DEXAMETHASONE SODIUM PHOSPHATE 10 MG/ML IJ SOLN
INTRAMUSCULAR | Status: DC | PRN
Start: 2021-08-03 — End: 2021-08-03
  Administered 2021-08-03: 5 mg via INTRAVENOUS

## 2021-08-03 MED ORDER — FENTANYL CITRATE (PF) 100 MCG/2ML IJ SOLN
INTRAMUSCULAR | Status: DC | PRN
  Administered 2021-08-03: 100 ug via INTRAVENOUS

## 2021-08-03 MED ORDER — PHENYLEPHRINE HCL 0.25 % NA SOLN: 1.0000 | Freq: Four times a day (QID) | NASAL | Status: DC | PRN

## 2021-08-03 MED ORDER — PHENYLEPHRINE HCL 0.25 % NA SOLN
NASAL | Status: AC
  Filled 2021-08-03: qty 15

## 2021-08-03 MED ORDER — PROMETHAZINE HCL 25 MG/ML IJ SOLN: 6.2500 mg | INTRAMUSCULAR | Status: DC | PRN

## 2021-08-03 MED ORDER — BUTAMBEN-TETRACAINE-BENZOCAINE 2-2-14 % EX AERO: 1.0000 | INHALATION_SPRAY | Freq: Once | CUTANEOUS | Status: DC

## 2021-08-03 MED ORDER — SUCCINYLCHOLINE CHLORIDE 200 MG/10ML IV SOSY
PREFILLED_SYRINGE | INTRAVENOUS | Status: DC | PRN
  Administered 2021-08-03: 120 mg via INTRAVENOUS

## 2021-08-03 MED ORDER — PROPOFOL 10 MG/ML IV BOLUS
INTRAVENOUS | Status: DC | PRN
  Administered 2021-08-03: 170 mg via INTRAVENOUS

## 2021-08-03 MED ORDER — CHLORHEXIDINE GLUCONATE 0.12 % MT SOLN
OROMUCOSAL | Status: AC
  Administered 2021-08-03: 15 mL via OROMUCOSAL
  Filled 2021-08-03: qty 15

## 2021-08-03 NOTE — Op Note (Signed)
Indication : Unexplained hemoptysis  in this remote smoker with a history of lung cancer in 2001 on Eliquis ENT evaluation and EGD negative.   Written informed consent was obtained prior to the procedure. The risks of the procedure including coughing, bleeding and the small chance of lung puncture requiring chest tube were discussed in great detail. The benefits & alternatives including serial follow up were also discussed.  General anesthesia was provided Bronchoscope entered from the ET tube Trachea & bronchial tree examined to the subsegmental level. Minimal amount of white secretions were noted. No endobronchial lesions seen.  The tracheobronchial mucosa was friable and easily bled to touch .  Left upper lobe takeoff was absent consistent with normal left upper lobectomy  BAL was  obtained from the L[facility].   The bronchoscope was then withdrawn and both nares and upper airway was inspected, no source of bleeding noted.  Impression -hemoptysis appears to be due to friable mucosa due to Eliquis   Fatimata Talsma V.  [phone]

## 2021-08-03 NOTE — Transfer of Care (Signed)
Immediate Anesthesia Transfer of Care Note  Patient: [name]  Procedure(s) Performed: VIDEO BRONCHOSCOPY WITHOUT FLUORO BRONCHIAL WASHINGS  Patient Location: PACU  Anesthesia Type:General  Level of Consciousness: awake  Airway & Oxygen Therapy: Patient Spontanous Breathing and Patient connected to face mask  Post-op Assessment: Report given to RN and Post -op Vital signs reviewed and stable  Post vital signs: stable  Last Vitals:  Vitals Value Taken Time  BP 143/63 08/03/21 0910  Temp 36.2 C 08/03/21 0910  Pulse 99 08/03/21 0913  Resp 26 08/03/21 0913  SpO2 97 % 08/03/21 0913  Vitals shown include unvalidated device data.  Last Pain:  Vitals:   08/03/21 0602  TempSrc:   PainSc: 0-No pain         Complications: No notable events documented.

## 2021-08-03 NOTE — Discharge Instructions (Signed)
OK to resume eliquis  HOD eliquis if increased bleeding

## 2021-08-03 NOTE — Interval H&P Note (Signed)
History and Physical Interval Note:  08/03/2021 8:12 AM  [name]  has presented today for surgery, with the diagnosis of HEMOTYSIS.  The various methods of treatment have been discussed with the patient and family. After consideration of risks, benefits and other options for treatment, the patient has consented to  Procedure(s): VIDEO BRONCHOSCOPY WITHOUT FLUORO (N/A) as a surgical intervention.  The patient's history has been reviewed, patient examined, no change in status, stable for surgery.  I have reviewed the patient's chart and labs.  Questions were answered to the patient's satisfaction.     Leanna Sato Elsworth Soho

## 2021-08-03 NOTE — Anesthesia Postprocedure Evaluation (Signed)
Anesthesia Post Note  Patient: [name]  Procedure(s) Performed: VIDEO BRONCHOSCOPY WITHOUT FLUORO BRONCHIAL WASHINGS     Patient location during evaluation: PACU Anesthesia Type: General Level of consciousness: awake and alert Pain management: pain level controlled Vital Signs Assessment: post-procedure vital signs reviewed and stable Respiratory status: spontaneous breathing, nonlabored ventilation and respiratory function stable Cardiovascular status: blood pressure returned to baseline and stable Postop Assessment: no apparent nausea or vomiting Anesthetic complications: no   No notable events documented.  Last Vitals:  Vitals:   08/03/21 0925 08/03/21 0940  BP: (!) 122/103 131/81  Pulse: 87 79  Resp: 17 17  Temp:  36.4 C  SpO2: 100% 100%    Last Pain:  Vitals:   08/03/21 0940  TempSrc:   PainSc: 0-No pain                 Lynda Rainwater

## 2021-08-03 NOTE — Anesthesia Procedure Notes (Signed)
Procedure Name: Intubation Date/Time: 08/03/2021 8:40 AM Performed by: Yehuda Mao, CRNA Pre-anesthesia Checklist: Patient identified, Emergency Drugs available, Suction available and Patient being monitored Patient Re-evaluated:Patient Re-evaluated prior to induction Oxygen Delivery Method: Circle system utilized Preoxygenation: Pre-oxygenation with 100% oxygen Induction Type: IV induction Ventilation: Mask ventilation without difficulty Laryngoscope Size: Mac and 3 Grade View: Grade II Tube type: Oral Tube size: 8.5 mm Number of attempts: 1 Airway Equipment and Method: Stylet and Oral airway Placement Confirmation: ETT inserted through vocal cords under direct vision, positive ETCO2 and breath sounds checked- equal and bilateral Secured at: 23 cm Tube secured with: Tape Dental Injury: Teeth and Oropharynx as per pre-operative assessment

## 2021-08-04 LAB — ACID FAST SMEAR (AFB, MYCOBACTERIA): Acid Fast Smear: NEGATIVE

## 2021-08-05 ENCOUNTER — Encounter (HOSPITAL_COMMUNITY): Payer: Self-pay | Admitting: Pulmonary Disease

## 2021-08-05 LAB — CULTURE, RESPIRATORY W GRAM STAIN: Culture: NO GROWTH

## 2021-08-08 ENCOUNTER — Ambulatory Visit (HOSPITAL_BASED_OUTPATIENT_CLINIC_OR_DEPARTMENT_OTHER): Payer: Medicare PPO | Admitting: Cardiology

## 2021-08-21 ENCOUNTER — Ambulatory Visit: Payer: Medicare PPO | Admitting: Physician Assistant

## 2021-08-28 DIAGNOSIS — E1165 Type 2 diabetes mellitus with hyperglycemia: Secondary | ICD-10-CM | POA: Diagnosis not present

## 2021-08-29 NOTE — Progress Notes (Signed)
Cardiology Office Note:    Date:  08/30/2021   ID:  [name], DOB [birth year], MRN [id number]  PCP:  Glendale Chard, MD  Cardiologist:  Sinclair Grooms, MD   Referring MD: Glendale Chard, MD   No chief complaint on file.   History of Present Illness:    [name] is a 65 y.o. female with a hx of of CAD s/p DES to [facility] and mLAD 07/11/15 (patent stents on repeat cath 11/2016), chronic pericardial effusion, h/o chronic pericarditis with chronic pericardial rub treated intermittently with steroids and colchicine, iritis, PAF on Eliquis and dofetilide therapy, chronic diastolic CHF, DM, HTN, remote h/o treated non-small cell lung cancer, pulmonary fibrosis, aortic insufficiency, COPD and anemia.   Recurrent chest pain and history of iritis and musculoskeletal complaints raise a question of autoimmune disease as a possible basis for the patient's recurrent chest discomfort and pericardial disease.  A new work-up was performed and was unremarkable with CRP of 2.0 in September 2022; negative ANA, negative double-stranded DNA antibody; negative rheumatoid factor; negative ENA and ANA RNP antibodies; extractable nuclear antibodies were also negative  Still having difficulty with chest pain at rest and with exertion.  There is positional components.  Major complaint is exertional shortness of breath.  We will try to complete a sleep study but she states that Dr. Elsworth Soho asked her not to follow through.  She was having hemoptysis.  No answer has been found.  She has had cardiac MRI in the past (2018) without evidence of myocarditis and inflammation being found.  Past Medical History:  Diagnosis Date   Allergy    Anemia    Anemia    Aortic insufficiency    a. Mod by echo 05/2015.   Arthritis    "knees" (07/11/2015)   Atrial fibrillation (HCC)    On Eliquis   Carcinoma of hilus of lung ([place]) 01/20/2012   IIIB NSCL  Right hilar mass compressing esophagus/presenting with dysphagia Rx  Surgery/RT/chemo dx January 2001   Coronary artery disease    a. Abnormal stress test -> LHc 06/2015 s/p DES to mCX and mLAD, residual D2 disease treated medially.   Diverticulitis    Elevated blood pressure    Erythema nodosum    Heart murmur    Insomnia    lung ca dx'd 10/1999   chemo/xrt comp 05/29/2000   Myalgia    Nephrolithiasis 2015   Obesity    Pericardial effusion    a. 05/2015 Echo: EF 55-60%, no rwma, Gr 1 DD, no effusion - but an effusion was seen on CT which was felt to be increased in size compared to 12/2014 - pericardium also thickened.    Pericarditis    a. Dx 05/2015.   Pneumothorax, spontaneous, tension 01/20/2012   October, 2010   Pulmonary fibrosis ([place]) 01/20/2012   Due to previous surgery and chest radiation for lung cancer   Restrictive lung disease    Type II diabetes mellitus [facility])     Past Surgical History:  Procedure Laterality Date   ABDOMINAL HYSTERECTOMY  10/21/2006   APPENDECTOMY  1969?   BRONCHIAL WASHINGS  08/03/2021   Procedure: BRONCHIAL WASHINGS;  Surgeon: Rigoberto Noel, MD;  Location: [place] ;  Service: Cardiopulmonary;;   CARDIAC CATHETERIZATION N/A 07/11/2015   Procedure: Left Heart Cath and Coronary Angiography;  Surgeon: Jettie Booze, MD;  Location: [place] [facility];  Service: Cardiovascular;  Laterality: N/A;   CARDIAC CATHETERIZATION  07/11/2015   Procedure: Coronary  Stent Intervention;  Surgeon: Jettie Booze, MD;  Location: [place] [facility];  Service: Cardiovascular;;   CARPAL TUNNEL RELEASE Bilateral 1998-2005?   right-left   CARPAL TUNNEL RELEASE Left 2009?   "for the 2nd time"   COLONOSCOPY     COLONOSCOPY WITH PROPOFOL N/A 05/28/2018   Procedure: COLONOSCOPY WITH PROPOFOL;  Surgeon: Milus Banister, MD;  Location: [facility];  Service: Endoscopy;  Laterality: N/A;   CORONARY ANGIOPLASTY     CYSTOSCOPY W/ STONE MANIPULATION  10/22/2011   ESOPHAGOGASTRODUODENOSCOPY (EGD) WITH ESOPHAGEAL DILATION  10/21/2010    FOOT SURGERY Bilateral 06/22/1979   Callous removed    INNER EAR SURGERY Right ~ 2009   KNEE ARTHROPLASTY Right 10/21/1989   KNEE ARTHROSCOPY  ~ 2000   LATERAL RELEASE   LEFT HEART CATH AND CORONARY ANGIOGRAPHY N/A 12/18/2016   Procedure: Left Heart Cath and Coronary Angiography;  Surgeon: Burnell Blanks, MD;  Location: [place] [facility];  Service: Cardiovascular;  Laterality: N/A;   LEFT HEART CATH AND CORONARY ANGIOGRAPHY N/A 02/01/2021   Procedure: LEFT HEART CATH AND CORONARY ANGIOGRAPHY;  Surgeon: Leonie Man, MD;  Location: [place] [facility];  Service: Cardiovascular;  Laterality: N/A;   LUNG CANCER SURGERY Left 10/22/1999   "small cell"   LUNG SURGERY Right 10/21/2005   "reinflated it"   PULMONARY EMBOLISM SURGERY  10/21/2005   LUNG COLLAPSE   RIGHT AND LEFT HEART CATH N/A 06/25/2017   Procedure: RIGHT AND LEFT HEART CATH;  Surgeon: Sherren Mocha, MD;  Location: [place] [facility];  Service: Cardiovascular;  Laterality: N/A;   SHOULDER ADHESION RELEASE Right ~ 2004   SHOULDER ARTHROSCOPY W/ ROTATOR CUFF REPAIR Right ~ 2003   UPPER GASTROINTESTINAL ENDOSCOPY  06/05/2021   VIDEO BRONCHOSCOPY N/A 08/03/2021   Procedure: VIDEO BRONCHOSCOPY WITHOUT FLUORO;  Surgeon: Rigoberto Noel, MD;  Location: [place];  Service: Cardiopulmonary;  Laterality: N/A;    Current Medications: Current Meds  Medication Sig   Accu-Chek Softclix Lancets lancets USE LANCETS 3 TIMES A DAY AS DIRECTED 30 DAYS   acetaminophen (TYLENOL) 500 MG tablet Take 1,000 mg by mouth every 6 (six) hours as needed for moderate pain or headache.   azaTHIOprine (IMURAN) 50 MG tablet Take 150 mg by mouth daily.   B-D UF III MINI PEN NEEDLES 31G X 5 MM MISC 2 (two) times daily. as directed   Blood Glucose Monitoring Suppl (ACCU-CHEK GUIDE) w/Device KIT See admin instructions.   co-enzyme Q-10 50 MG capsule TAKE 1 CAPSULE BY MOUTH EVERY DAY   colchicine 0.6 MG tablet Take 0.5 tablets (0.3 mg total) by  mouth every other day.   diltiazem (CARDIZEM CD) 360 MG 24 hr capsule TAKE 1 CAPSULE BY MOUTH EVERY DAY   diphenhydrAMINE (BENADRYL) 25 MG tablet Take 50 mg by mouth daily as needed for allergies.   diphenhydramine-acetaminophen (TYLENOL PM) 25-500 MG TABS tablet Take 2 tablets by mouth at bedtime as needed (sleep).   dofetilide (TIKOSYN) 500 MCG capsule TAKE 1 CAPSULE (500 MCG TOTAL) BY MOUTH 2 (TWO) TIMES DAILY.   ELIQUIS 5 MG TABS tablet TAKE 1 TABLET BY MOUTH TWICE A DAY   folic acid (FOLVITE) 1 MG tablet Take 1 mg by mouth daily.   furosemide (LASIX) 40 MG tablet Take 1 tablet (40 mg total) by mouth daily as needed for fluid or edema.   HUMALOG MIX 75/25 KWIKPEN (75-25) 100 UNIT/ML Kwikpen Inject 35-40 Units into the skin See admin instructions. Inject 40 units  SQ in Am and 35 units SQ in PM   levothyroxine (SYNTHROID, LEVOTHROID) 50 MCG tablet Take 50 mcg by mouth daily before breakfast.    losartan (COZAAR) 25 MG tablet TAKE 1 TABLET BY MOUTH EVERY DAY   meclizine (ANTIVERT) 12.5 MG tablet Take 1 tablet (12.5 mg total) by mouth 3 (three) times daily as needed for dizziness.   metoprolol succinate (TOPROL-XL) 50 MG 24 hr tablet Take 1.5 tablets (75 mg total) by mouth at bedtime. Take with or immediately following a meal.   OZEMPIC, 0.25 OR 0.5 MG/DOSE, 2 MG/1.5ML SOPN INJECT 0.5 MG INTO THE SKIN ONCE A WEEK.   pravastatin (PRAVACHOL) 20 MG tablet Take 1 tablet (20 mg total) by mouth daily.   spironolactone (ALDACTONE) 25 MG tablet Take 1 tablet (25 mg total) by mouth daily.     Allergies:   Hydrocodone, Morphine and related, Canagliflozin, Metformin hcl, Rosuvastatin, Lisinopril, and Nsaids   Social History   Socioeconomic History   Marital status: Single    Spouse name: Not on file   Number of children: 0   Years of education: Not on file   Highest education level: Not on file  Occupational History   Occupation: RETIRED  Tobacco Use   Smoking status: Former    Packs/day: 0.25     Years: 5.00    Pack years: 1.25    Types: Cigarettes    Quit date: 07/22/1999    Years since quitting: 22.1   Smokeless tobacco: Never  Vaping Use   Vaping Use: Never used  Substance and Sexual Activity   Alcohol use: No    Alcohol/week: 0.0 standard drinks   Drug use: No   Sexual activity: Yes    Birth control/protection: Other-see comments    Comment: hysterectomy  Other Topics Concern   Not on file  Social History Narrative   Not on file   Social Determinants of Health   Financial Resource Strain: Low Risk    Difficulty of Paying Living Expenses: Not hard at all  Food Insecurity: No Food Insecurity   Worried About Charity fundraiser in the Last Year: Never true   [place] in the Last Year: Never true  Transportation Needs: No Transportation Needs   Lack of Transportation (Medical): No   Lack of Transportation (Non-Medical): No  Physical Activity: Inactive   Days of Exercise per Week: 0 days   Minutes of Exercise per Session: 0 min  Stress: No Stress Concern Present   Feeling of Stress : Not at all  Social Connections: Not on file     Family History: The patient's family history includes Diabetes in her father, maternal grandmother, mother, paternal grandmother, sister, sister, sister, and sister; Heart disease in her father; Hypertension in her sister; Kidney disease in her sister; Pancreatic cancer in her mother. There is no history of Colon cancer, Colon polyps, Rectal cancer, Stomach cancer, Esophageal cancer, or Breast cancer.  ROS:   Please see the history of present illness.    We discussed the work-up at this point which did not reveal any evidence of active inflammatory disease.  All other systems reviewed and are negative.  EKGs/Labs/Other Studies Reviewed:    The following studies were reviewed today:  2D Doppler echocardiogram July 09, 2021:  IMPRESSIONS   1. Left ventricular ejection fraction, by estimation, is 60 to 65%. The  left  ventricle has normal function. The left ventricle has no regional  wall motion abnormalities. There is mild  left ventricular hypertrophy.  Left ventricular diastolic parameters  are consistent with Grade II diastolic dysfunction (pseudonormalization).   2. Right ventricular systolic function is normal. The right ventricular  size is normal. There is normal pulmonary artery systolic pressure. The  estimated right ventricular systolic pressure is 70.2 mmHg.   3. Left atrial size was mildly dilated.   4. Right atrial size was mildly dilated.   5. A small pericardial effusion is present. No evidence for tamponade. No  evidence for effusive/constrictive pericarditis (normal IVC size, no  respirophasic variation of the interventricular septal position).   6. The mitral valve is normal in structure. No evidence of mitral valve  regurgitation. No evidence of mitral stenosis.   7. The aortic valve is tricuspid. Aortic valve regurgitation is moderate.  No aortic stenosis is present.   8. The inferior vena cava is normal in size with greater than 50%  respiratory variability, suggesting right atrial pressure of 3 mmHg.   04/14/2017: Cardiac MRI IMPRESSION: 1) Moderate pericardial effusion lateral to the RV free wall and posterior LV myocardium. Note this is not appreciated on recent echo   2) Normal pericardial thickness with mild gadolinium uptake over the RV free wall and apex on 3 chamber view   3) No evidence of constriction although MRI especially with no free breathing sequences not ideal to assess Pericardium normal thickness and no ventricular inter dependence   4) Normal LV EF 59% (EDV 105 ESV 43 SV 62 cc)   5) No evidence of myocarditis or LV myocardial uptake of gadolinium on delayed inversion recovery sequences   6) Tri leaflet aortic valve with AR   7) Mild LAE   Images were also reviewed by Dr Loralie Champagne who agreed with above findings EKG:  EKG not repeated  Recent  Labs: 09/08/2020: TSH 2.62 03/05/2021: Magnesium 1.8 04/25/2021: ALT 9 06/14/2021: BUN 24; Creatinine, Ser 1.07; Hemoglobin 11.6; Platelets 285; Potassium 3.7; Sodium 140  Recent Lipid Panel    Component Value Date/Time   CHOL 130 04/25/2021 1614   TRIG 102 04/25/2021 1614   HDL 44 04/25/2021 1614   CHOLHDL 3.0 04/25/2021 1614   CHOLHDL 3.1 02/17/2019 0215   VLDL 17 02/17/2019 0215   LDLCALC 67 04/25/2021 1614    Physical Exam:    VS:  BP (!) 118/58   Pulse 76   Ht 5' 7.5" (1.715 m)   Wt 241 lb 9.6 oz (109.6 kg)   SpO2 96%   BMI 37.28 kg/m     Wt Readings from Last 3 Encounters:  08/30/21 241 lb 9.6 oz (109.6 kg)  08/03/21 234 lb (106.1 kg)  07/25/21 236 lb 12.8 oz (107.4 kg)     GEN: Obese. No acute distress HEENT: Normal NECK: No JVD. LYMPHATICS: No lymphadenopathy CARDIAC: No murmur. RRR S4 gallop, or edema. VASCULAR:  Normal Pulses. No bruits. RESPIRATORY:  Clear to auscultation without rales, wheezing or rhonchi  ABDOMEN: Soft, non-tender, non-distended, No pulsatile mass, MUSCULOSKELETAL: No deformity  SKIN: Warm and dry NEUROLOGIC:  Alert and oriented x 3 PSYCHIATRIC:  Normal affect   ASSESSMENT:    1. Chest pain of uncertain etiology   2. Coronary artery disease involving native coronary artery of native heart without angina pectoris   3. Persistent atrial fibrillation ([place])   4. Chronic diastolic (congestive) heart failure ([place])   5. OSA (obstructive sleep apnea)   6. Hyperlipidemia, unspecified hyperlipidemia type    PLAN:    In order of problems listed above:  Uncertain etiology.  Possible microvascular disease.  Possible musculoskeletal discomfort.  Plan to enroll in cardiac rehab.  Try sublingual nitroglycerin. Secondary prevention.  Start cardiac rehab.  Possible small vessel disease/microvascular dysfunction.  Try sublingual nitroglycerin for chest discomfort and report results. Did not discuss No evidence of volume overload Second phase of  her sleep study was called off by Dr. Elsworth Soho.  Will discuss rationale for not completing Continue statin therapy.     Follow-up in 4 to 6 months.   Medication Adjustments/Labs and Tests Ordered: Current medicines are reviewed at length with the patient today.  Concerns regarding medicines are outlined above.  Orders Placed This Encounter  Procedures   AMB referral to cardiac rehabilitation   Meds ordered this encounter  Medications   nitroGLYCERIN (NITROSTAT) 0.4 MG SL tablet    Sig: Place 1 tablet (0.4 mg total) under the tongue every 5 (five) minutes as needed for chest pain.    Dispense:  25 tablet    Refill:  3    Patient Instructions  Medication Instructions:  Your physician recommends that you continue on your current medications as directed. Please refer to the Current Medication list given to you today.  *If you need a refill on your cardiac medications before your next appointment, please call your pharmacy*   Lab Work: None If you have labs (blood work) drawn today and your tests are completely normal, you will receive your results only by: [place] (if you have MyChart) OR A paper copy in the mail If you have any lab test that is abnormal or we need to change your treatment, we will call you to review the results.   Testing/Procedures: None   Follow-Up: At [facility], you and your health needs are our priority.  As part of our continuing mission to provide you with exceptional heart care, we have created designated Provider Care Teams.  These Care Teams include your primary Cardiologist (physician) and Advanced Practice Providers (APPs -  Physician Assistants and Nurse Practitioners) who all work together to provide you with the care you need, when you need it.  We recommend signing up for the patient portal called "MyChart".  Sign up information is provided on this After Visit Summary.  MyChart is used to connect with patients for Virtual Visits  (Telemedicine).  Patients are able to view lab/test results, encounter notes, upcoming appointments, etc.  Non-urgent messages can be sent to your provider as well.   To learn more about what you can do with MyChart, go to [email or web address].    Your next appointment:   4-6 month(s)  The format for your next appointment:   In Person  Provider:   Sinclair Grooms, MD     Other Instructions   You have been referred to Cardiac Rehab.  They will contact you to get you scheduled.     Signed, Sinclair Grooms, MD  08/30/2021 12:41 PM    [place]

## 2021-08-30 ENCOUNTER — Other Ambulatory Visit: Payer: Self-pay

## 2021-08-30 ENCOUNTER — Encounter: Payer: Self-pay | Admitting: Interventional Cardiology

## 2021-08-30 ENCOUNTER — Ambulatory Visit: Payer: Medicare PPO | Admitting: Interventional Cardiology

## 2021-08-30 VITALS — BP 118/58 | HR 76 | Ht 67.5 in | Wt 241.6 lb

## 2021-08-30 DIAGNOSIS — E785 Hyperlipidemia, unspecified: Secondary | ICD-10-CM

## 2021-08-30 DIAGNOSIS — R079 Chest pain, unspecified: Secondary | ICD-10-CM | POA: Diagnosis not present

## 2021-08-30 DIAGNOSIS — G4733 Obstructive sleep apnea (adult) (pediatric): Secondary | ICD-10-CM

## 2021-08-30 DIAGNOSIS — I4819 Other persistent atrial fibrillation: Secondary | ICD-10-CM

## 2021-08-30 DIAGNOSIS — I5032 Chronic diastolic (congestive) heart failure: Secondary | ICD-10-CM | POA: Diagnosis not present

## 2021-08-30 DIAGNOSIS — I2510 Atherosclerotic heart disease of native coronary artery without angina pectoris: Secondary | ICD-10-CM | POA: Diagnosis not present

## 2021-08-30 MED ORDER — NITROGLYCERIN 0.4 MG SL SUBL: 0.4000 mg | SUBLINGUAL_TABLET | SUBLINGUAL | 3 refills | Status: DC | PRN

## 2021-08-30 NOTE — Patient Instructions (Signed)
Medication Instructions:  Your physician recommends that you continue on your current medications as directed. Please refer to the Current Medication list given to you today.  *If you need a refill on your cardiac medications before your next appointment, please call your pharmacy*   Lab Work: None If you have labs (blood work) drawn today and your tests are completely normal, you will receive your results only by: [place] (if you have MyChart) OR A paper copy in the mail If you have any lab test that is abnormal or we need to change your treatment, we will call you to review the results.   Testing/Procedures: None   Follow-Up: At [facility], you and your health needs are our priority.  As part of our continuing mission to provide you with exceptional heart care, we have created designated Provider Care Teams.  These Care Teams include your primary Cardiologist (physician) and Advanced Practice Providers (APPs -  Physician Assistants and Nurse Practitioners) who all work together to provide you with the care you need, when you need it.  We recommend signing up for the patient portal called "MyChart".  Sign up information is provided on this After Visit Summary.  MyChart is used to connect with patients for Virtual Visits (Telemedicine).  Patients are able to view lab/test results, encounter notes, upcoming appointments, etc.  Non-urgent messages can be sent to your provider as well.   To learn more about what you can do with MyChart, go to [email or web address].    Your next appointment:   4-6 month(s)  The format for your next appointment:   In Person  Provider:   Sinclair Grooms, MD     Other Instructions   You have been referred to Cardiac Rehab.  They will contact you to get you scheduled.

## 2021-09-04 DIAGNOSIS — H40033 Anatomical narrow angle, bilateral: Secondary | ICD-10-CM | POA: Diagnosis not present

## 2021-09-04 DIAGNOSIS — E119 Type 2 diabetes mellitus without complications: Secondary | ICD-10-CM | POA: Diagnosis not present

## 2021-09-04 DIAGNOSIS — H2513 Age-related nuclear cataract, bilateral: Secondary | ICD-10-CM | POA: Diagnosis not present

## 2021-09-04 DIAGNOSIS — H04123 Dry eye syndrome of bilateral lacrimal glands: Secondary | ICD-10-CM | POA: Diagnosis not present

## 2021-09-04 DIAGNOSIS — H15102 Unspecified episcleritis, left eye: Secondary | ICD-10-CM | POA: Diagnosis not present

## 2021-09-04 DIAGNOSIS — Z794 Long term (current) use of insulin: Secondary | ICD-10-CM | POA: Diagnosis not present

## 2021-09-04 DIAGNOSIS — H10413 Chronic giant papillary conjunctivitis, bilateral: Secondary | ICD-10-CM | POA: Diagnosis not present

## 2021-09-04 DIAGNOSIS — H31012 Macula scars of posterior pole (postinflammatory) (post-traumatic), left eye: Secondary | ICD-10-CM | POA: Diagnosis not present

## 2021-09-04 LAB — HM DIABETES EYE EXAM

## 2021-09-05 ENCOUNTER — Encounter: Payer: Self-pay | Admitting: Internal Medicine

## 2021-09-05 ENCOUNTER — Encounter (HOSPITAL_BASED_OUTPATIENT_CLINIC_OR_DEPARTMENT_OTHER): Payer: Medicare PPO | Admitting: Cardiology

## 2021-09-05 DIAGNOSIS — I2510 Atherosclerotic heart disease of native coronary artery without angina pectoris: Secondary | ICD-10-CM | POA: Diagnosis not present

## 2021-09-05 DIAGNOSIS — E039 Hypothyroidism, unspecified: Secondary | ICD-10-CM | POA: Diagnosis not present

## 2021-09-05 DIAGNOSIS — I4891 Unspecified atrial fibrillation: Secondary | ICD-10-CM | POA: Diagnosis not present

## 2021-09-05 DIAGNOSIS — E7800 Pure hypercholesterolemia, unspecified: Secondary | ICD-10-CM | POA: Diagnosis not present

## 2021-09-05 DIAGNOSIS — E1165 Type 2 diabetes mellitus with hyperglycemia: Secondary | ICD-10-CM | POA: Diagnosis not present

## 2021-09-05 DIAGNOSIS — I10 Essential (primary) hypertension: Secondary | ICD-10-CM | POA: Diagnosis not present

## 2021-09-06 DIAGNOSIS — M1712 Unilateral primary osteoarthritis, left knee: Secondary | ICD-10-CM | POA: Diagnosis not present

## 2021-09-06 DIAGNOSIS — M7701 Medial epicondylitis, right elbow: Secondary | ICD-10-CM | POA: Diagnosis not present

## 2021-09-06 DIAGNOSIS — M7702 Medial epicondylitis, left elbow: Secondary | ICD-10-CM | POA: Diagnosis not present

## 2021-09-14 ENCOUNTER — Other Ambulatory Visit: Payer: Self-pay | Admitting: Cardiology

## 2021-09-19 ENCOUNTER — Encounter: Payer: Medicare PPO | Admitting: Internal Medicine

## 2021-09-19 NOTE — Progress Notes (Signed)
erroneous

## 2021-09-20 LAB — ACID FAST CULTURE WITH REFLEXED SENSITIVITIES (MYCOBACTERIA): Acid Fast Culture: NEGATIVE

## 2021-09-24 ENCOUNTER — Telehealth (HOSPITAL_COMMUNITY): Payer: Self-pay

## 2021-09-25 DIAGNOSIS — M25521 Pain in right elbow: Secondary | ICD-10-CM | POA: Diagnosis not present

## 2021-09-30 ENCOUNTER — Other Ambulatory Visit: Payer: Self-pay | Admitting: Interventional Cardiology

## 2021-10-01 DIAGNOSIS — M7701 Medial epicondylitis, right elbow: Secondary | ICD-10-CM | POA: Diagnosis not present

## 2021-10-01 DIAGNOSIS — M7702 Medial epicondylitis, left elbow: Secondary | ICD-10-CM | POA: Diagnosis not present

## 2021-10-01 DIAGNOSIS — M1712 Unilateral primary osteoarthritis, left knee: Secondary | ICD-10-CM | POA: Diagnosis not present

## 2021-10-01 NOTE — Telephone Encounter (Signed)
Does she need to continue taking this?

## 2021-10-01 NOTE — Telephone Encounter (Signed)
Pt's pharmacy is requesting a refill on colchicine. Would Dr. Tamala Julian like to refill this medication? Please address

## 2021-10-02 NOTE — Telephone Encounter (Signed)
Spoke with pt and she states the Colchicine is not helping with the CP.  States she took 2 nitro the other day with no relief.  She did not take a third because she did not want to go to the ER.  Sat down for a couple of hours and it passed.  No additional pain since then.  Advised I will update Dr. Tamala Julian and will call back with his recommendations.

## 2021-10-02 NOTE — Telephone Encounter (Signed)
Spoke with pt and made her aware to stop Colchicine.  Advised pt if CP returns, try Nitro again and let me know if it helps or not. Pt verbalized understanding and was in agreement with plan.

## 2021-10-03 NOTE — Telephone Encounter (Signed)
Pt no longer wants to participate in the cardiac rehab program. Closed referral.

## 2021-10-11 ENCOUNTER — Inpatient Hospital Stay (HOSPITAL_COMMUNITY): Admission: RE | Admit: 2021-10-11 | Payer: Medicare PPO | Source: Ambulatory Visit

## 2021-10-15 ENCOUNTER — Other Ambulatory Visit: Payer: Self-pay | Admitting: Cardiology

## 2021-10-17 ENCOUNTER — Ambulatory Visit (HOSPITAL_COMMUNITY): Payer: Medicare PPO

## 2021-10-19 ENCOUNTER — Ambulatory Visit (HOSPITAL_COMMUNITY): Payer: Medicare PPO

## 2021-10-24 ENCOUNTER — Ambulatory Visit (HOSPITAL_COMMUNITY): Payer: Medicare PPO

## 2021-10-24 ENCOUNTER — Ambulatory Visit: Payer: Medicare PPO | Admitting: Interventional Cardiology

## 2021-10-25 ENCOUNTER — Ambulatory Visit: Payer: Medicare PPO | Admitting: Adult Health

## 2021-10-26 ENCOUNTER — Ambulatory Visit (HOSPITAL_COMMUNITY): Payer: Medicare PPO

## 2021-10-29 ENCOUNTER — Ambulatory Visit (HOSPITAL_COMMUNITY): Payer: Medicare PPO

## 2021-10-30 DIAGNOSIS — Z79899 Other long term (current) drug therapy: Secondary | ICD-10-CM | POA: Diagnosis not present

## 2021-10-30 DIAGNOSIS — E1136 Type 2 diabetes mellitus with diabetic cataract: Secondary | ICD-10-CM | POA: Diagnosis not present

## 2021-10-30 DIAGNOSIS — H2513 Age-related nuclear cataract, bilateral: Secondary | ICD-10-CM | POA: Diagnosis not present

## 2021-10-30 DIAGNOSIS — Z794 Long term (current) use of insulin: Secondary | ICD-10-CM | POA: Diagnosis not present

## 2021-10-30 DIAGNOSIS — Z7985 Long-term (current) use of injectable non-insulin antidiabetic drugs: Secondary | ICD-10-CM | POA: Diagnosis not present

## 2021-10-30 DIAGNOSIS — H15002 Unspecified scleritis, left eye: Secondary | ICD-10-CM | POA: Diagnosis not present

## 2021-10-30 DIAGNOSIS — H35033 Hypertensive retinopathy, bilateral: Secondary | ICD-10-CM | POA: Diagnosis not present

## 2021-10-30 DIAGNOSIS — H15102 Unspecified episcleritis, left eye: Secondary | ICD-10-CM | POA: Diagnosis not present

## 2021-10-31 ENCOUNTER — Ambulatory Visit (HOSPITAL_COMMUNITY): Payer: Medicare PPO

## 2021-11-02 ENCOUNTER — Ambulatory Visit (HOSPITAL_COMMUNITY): Payer: Medicare PPO

## 2021-11-05 ENCOUNTER — Ambulatory Visit (HOSPITAL_COMMUNITY): Payer: Medicare PPO

## 2021-11-06 DIAGNOSIS — H15102 Unspecified episcleritis, left eye: Secondary | ICD-10-CM | POA: Diagnosis not present

## 2021-11-06 DIAGNOSIS — Z79899 Other long term (current) drug therapy: Secondary | ICD-10-CM | POA: Diagnosis not present

## 2021-11-06 DIAGNOSIS — H15002 Unspecified scleritis, left eye: Secondary | ICD-10-CM | POA: Diagnosis not present

## 2021-11-07 ENCOUNTER — Ambulatory Visit (HOSPITAL_COMMUNITY): Payer: Medicare PPO

## 2021-11-09 ENCOUNTER — Ambulatory Visit (HOSPITAL_COMMUNITY): Payer: Medicare PPO

## 2021-11-12 ENCOUNTER — Ambulatory Visit (HOSPITAL_COMMUNITY): Payer: Medicare PPO

## 2021-11-14 ENCOUNTER — Ambulatory Visit (HOSPITAL_COMMUNITY): Payer: Medicare PPO

## 2021-11-16 ENCOUNTER — Ambulatory Visit (HOSPITAL_COMMUNITY): Payer: Medicare PPO

## 2021-11-19 ENCOUNTER — Other Ambulatory Visit: Payer: Self-pay | Admitting: Interventional Cardiology

## 2021-11-19 ENCOUNTER — Other Ambulatory Visit: Payer: Self-pay | Admitting: Cardiology

## 2021-11-19 ENCOUNTER — Ambulatory Visit (HOSPITAL_COMMUNITY): Payer: Medicare PPO

## 2021-11-20 MED ORDER — METOPROLOL SUCCINATE ER 50 MG PO TB24: 75.0000 mg | ORAL_TABLET | Freq: Every day | ORAL | 3 refills | Status: DC

## 2021-11-21 ENCOUNTER — Ambulatory Visit (HOSPITAL_COMMUNITY): Payer: Medicare PPO

## 2021-11-23 ENCOUNTER — Ambulatory Visit (HOSPITAL_COMMUNITY): Payer: Medicare PPO

## 2021-11-26 ENCOUNTER — Emergency Department (HOSPITAL_COMMUNITY): Payer: Medicare PPO

## 2021-11-26 ENCOUNTER — Observation Stay (HOSPITAL_BASED_OUTPATIENT_CLINIC_OR_DEPARTMENT_OTHER): Payer: Medicare PPO

## 2021-11-26 ENCOUNTER — Ambulatory Visit (HOSPITAL_COMMUNITY): Payer: Medicare PPO

## 2021-11-26 ENCOUNTER — Other Ambulatory Visit: Payer: Self-pay

## 2021-11-26 ENCOUNTER — Encounter (HOSPITAL_COMMUNITY): Payer: Self-pay

## 2021-11-26 ENCOUNTER — Observation Stay (HOSPITAL_COMMUNITY)
Admission: EM | Admit: 2021-11-26 | Discharge: 2021-11-27 | Disposition: A | Discharge status: Home / Self Care | Payer: Medicare PPO | Attending: Interventional Cardiology | Admitting: Interventional Cardiology

## 2021-11-26 DIAGNOSIS — Z96651 Presence of right artificial knee joint: Secondary | ICD-10-CM | POA: Diagnosis not present

## 2021-11-26 DIAGNOSIS — Z85118 Personal history of other malignant neoplasm of bronchus and lung: Secondary | ICD-10-CM | POA: Insufficient documentation

## 2021-11-26 DIAGNOSIS — Z794 Long term (current) use of insulin: Secondary | ICD-10-CM | POA: Insufficient documentation

## 2021-11-26 DIAGNOSIS — R079 Chest pain, unspecified: Secondary | ICD-10-CM | POA: Diagnosis not present

## 2021-11-26 DIAGNOSIS — I2510 Atherosclerotic heart disease of native coronary artery without angina pectoris: Secondary | ICD-10-CM | POA: Diagnosis not present

## 2021-11-26 DIAGNOSIS — Z20822 Contact with and (suspected) exposure to covid-19: Secondary | ICD-10-CM | POA: Insufficient documentation

## 2021-11-26 DIAGNOSIS — I110 Hypertensive heart disease with heart failure: Secondary | ICD-10-CM | POA: Insufficient documentation

## 2021-11-26 DIAGNOSIS — Z79899 Other long term (current) drug therapy: Secondary | ICD-10-CM | POA: Diagnosis not present

## 2021-11-26 DIAGNOSIS — R0602 Shortness of breath: Secondary | ICD-10-CM | POA: Diagnosis not present

## 2021-11-26 DIAGNOSIS — I25118 Atherosclerotic heart disease of native coronary artery with other forms of angina pectoris: Secondary | ICD-10-CM | POA: Diagnosis not present

## 2021-11-26 DIAGNOSIS — Z87891 Personal history of nicotine dependence: Secondary | ICD-10-CM | POA: Insufficient documentation

## 2021-11-26 DIAGNOSIS — J449 Chronic obstructive pulmonary disease, unspecified: Secondary | ICD-10-CM | POA: Diagnosis not present

## 2021-11-26 DIAGNOSIS — I214 Non-ST elevation (NSTEMI) myocardial infarction: Secondary | ICD-10-CM | POA: Diagnosis not present

## 2021-11-26 DIAGNOSIS — Z955 Presence of coronary angioplasty implant and graft: Secondary | ICD-10-CM | POA: Diagnosis not present

## 2021-11-26 DIAGNOSIS — I480 Paroxysmal atrial fibrillation: Secondary | ICD-10-CM | POA: Diagnosis not present

## 2021-11-26 DIAGNOSIS — E782 Mixed hyperlipidemia: Secondary | ICD-10-CM | POA: Diagnosis not present

## 2021-11-26 DIAGNOSIS — Z7901 Long term (current) use of anticoagulants: Secondary | ICD-10-CM | POA: Diagnosis not present

## 2021-11-26 DIAGNOSIS — I509 Heart failure, unspecified: Secondary | ICD-10-CM | POA: Insufficient documentation

## 2021-11-26 DIAGNOSIS — E039 Hypothyroidism, unspecified: Secondary | ICD-10-CM | POA: Diagnosis not present

## 2021-11-26 DIAGNOSIS — E785 Hyperlipidemia, unspecified: Secondary | ICD-10-CM | POA: Diagnosis present

## 2021-11-26 DIAGNOSIS — E119 Type 2 diabetes mellitus without complications: Secondary | ICD-10-CM | POA: Diagnosis not present

## 2021-11-26 LAB — SARS CORONAVIRUS 2 BY RT PCR (HOSPITAL ORDER, PERFORMED IN ~~LOC~~ HOSPITAL LAB): SARS Coronavirus 2: NEGATIVE

## 2021-11-26 LAB — BASIC METABOLIC PANEL
Anion gap: 6 (ref 5–15)
BUN: 17 mg/dL (ref 8–23)
CO2: 27 mmol/L (ref 22–32)
Calcium: 9.1 mg/dL (ref 8.9–10.3)
Chloride: 108 mmol/L (ref 98–111)
Creatinine, Ser: 1.02 mg/dL — ABNORMAL HIGH (ref 0.44–1.00)
GFR, Estimated: >60 mL/min (ref >60)
Glucose, Bld: 123 mg/dL — ABNORMAL HIGH (ref 70–99)
Potassium: 4 mmol/L (ref 3.5–5.1)
Sodium: 141 mmol/L (ref 135–145)

## 2021-11-26 LAB — TROPONIN I (HIGH SENSITIVITY)
Troponin I (High Sensitivity): 8 ng/L (ref <18)
Troponin I (High Sensitivity): 2537 ng/L (ref <18)
Troponin I (High Sensitivity): 10 ng/L (ref <18)
Troponin I (High Sensitivity): 11 ng/L (ref <18)

## 2021-11-26 LAB — CBC
HCT: 35.1 % — ABNORMAL LOW (ref 36.0–46.0)
Hemoglobin: 11.1 g/dL — ABNORMAL LOW (ref 12.0–15.0)
MCH: 32 pg (ref 26.0–34.0)
MCHC: 31.6 g/dL (ref 30.0–36.0)
MCV: 101.2 fL — ABNORMAL HIGH (ref 80.0–100.0)
Platelets: 340 10*3/uL (ref 150–400)
RBC: 3.47 MIL/uL — ABNORMAL LOW (ref 3.87–5.11)
RDW: 12.3 % (ref 11.5–15.5)
WBC: 5.4 10*3/uL (ref 4.0–10.5)
nRBC: 0 % (ref 0.0–0.2)

## 2021-11-26 LAB — ECHOCARDIOGRAM COMPLETE
Area-P 1/2: 3.48 cm2
Height: 67.5 in
P 1/2 time: 345 msec
S' Lateral: 3 cm
Weight: 3712 oz

## 2021-11-26 LAB — MAGNESIUM: Magnesium: 1.9 mg/dL (ref 1.7–2.4)

## 2021-11-26 LAB — TSH: TSH: 3.167 u[IU]/mL (ref 0.350–4.500)

## 2021-11-26 LAB — HIV ANTIBODY (ROUTINE TESTING W REFLEX): HIV Screen 4th Generation wRfx: NONREACTIVE

## 2021-11-26 LAB — CBG MONITORING, ED: Glucose-Capillary: 110 mg/dL — ABNORMAL HIGH (ref 70–99)

## 2021-11-26 LAB — HEPARIN LEVEL (UNFRACTIONATED): Heparin Unfractionated: 0.99 IU/mL — ABNORMAL HIGH (ref 0.30–0.70)

## 2021-11-26 LAB — APTT: aPTT: 59 seconds — ABNORMAL HIGH (ref 24–36)

## 2021-11-26 LAB — GLUCOSE, CAPILLARY: Glucose-Capillary: 186 mg/dL — ABNORMAL HIGH (ref 70–99)

## 2021-11-26 MED ORDER — FENTANYL CITRATE PF 50 MCG/ML IJ SOSY
50.0000 ug | PREFILLED_SYRINGE | Freq: Once | INTRAMUSCULAR | Status: AC
  Administered 2021-11-26: 50 ug via INTRAVENOUS
  Filled 2021-11-26: qty 1

## 2021-11-26 MED ORDER — ASPIRIN EC 81 MG PO TBEC
81.0000 mg | DELAYED_RELEASE_TABLET | Freq: Every day | ORAL | Status: DC
  Administered 2021-11-27: 81 mg via ORAL
  Filled 2021-11-26: qty 1

## 2021-11-26 MED ORDER — SODIUM CHLORIDE 0.9% FLUSH: 3.0000 mL | INTRAVENOUS | Status: DC | PRN

## 2021-11-26 MED ORDER — AZATHIOPRINE 50 MG PO TABS
150.0000 mg | ORAL_TABLET | Freq: Every morning | ORAL | Status: DC
  Administered 2021-11-26 – 2021-11-27 (×2): 150 mg via ORAL
  Filled 2021-11-26 (×2): qty 3

## 2021-11-26 MED ORDER — SODIUM CHLORIDE 0.9% FLUSH
3.0000 mL | Freq: Two times a day (BID) | INTRAVENOUS | Status: DC
  Administered 2021-11-26 – 2021-11-27 (×3): 3 mL via INTRAVENOUS

## 2021-11-26 MED ORDER — SODIUM CHLORIDE 0.9 % WEIGHT BASED INFUSION
3.0000 mL/kg/h | INTRAVENOUS | Status: DC
  Administered 2021-11-27: 3 mL/kg/h via INTRAVENOUS

## 2021-11-26 MED ORDER — PRAVASTATIN SODIUM 10 MG PO TABS
20.0000 mg | ORAL_TABLET | Freq: Every day | ORAL | Status: DC
  Administered 2021-11-26 – 2021-11-27 (×2): 20 mg via ORAL
  Filled 2021-11-26 (×2): qty 2

## 2021-11-26 MED ORDER — ONDANSETRON HCL 4 MG/2ML IJ SOLN: 4.0000 mg | Freq: Four times a day (QID) | INTRAMUSCULAR | Status: DC | PRN

## 2021-11-26 MED ORDER — DOFETILIDE 500 MCG PO CAPS
500.0000 ug | ORAL_CAPSULE | Freq: Two times a day (BID) | ORAL | Status: DC
  Administered 2021-11-26 – 2021-11-27 (×2): 500 ug via ORAL
  Filled 2021-11-26 (×3): qty 1

## 2021-11-26 MED ORDER — DILTIAZEM HCL ER COATED BEADS 180 MG PO CP24
360.0000 mg | ORAL_CAPSULE | Freq: Every morning | ORAL | Status: DC
  Administered 2021-11-27: 360 mg via ORAL
  Filled 2021-11-26: qty 2

## 2021-11-26 MED ORDER — ASPIRIN 81 MG PO CHEW
324.0000 mg | CHEWABLE_TABLET | Freq: Once | ORAL | Status: AC
  Administered 2021-11-26: 324 mg via ORAL
  Filled 2021-11-26: qty 4

## 2021-11-26 MED ORDER — ASPIRIN 81 MG PO CHEW
81.0000 mg | CHEWABLE_TABLET | ORAL | Status: AC
  Administered 2021-11-27: 81 mg via ORAL
  Filled 2021-11-26: qty 1

## 2021-11-26 MED ORDER — INSULIN ASPART 100 UNIT/ML IJ SOLN
0.0000 [IU] | Freq: Three times a day (TID) | INTRAMUSCULAR | Status: DC
  Administered 2021-11-27: 3 [IU] via SUBCUTANEOUS
  Administered 2021-11-27: 2 [IU] via SUBCUTANEOUS

## 2021-11-26 MED ORDER — NITROGLYCERIN 0.4 MG SL SUBL: 0.4000 mg | SUBLINGUAL_TABLET | SUBLINGUAL | Status: DC | PRN

## 2021-11-26 MED ORDER — METOPROLOL SUCCINATE ER 50 MG PO TB24
75.0000 mg | ORAL_TABLET | Freq: Every day | ORAL | Status: DC
  Administered 2021-11-26: 75 mg via ORAL
  Filled 2021-11-26: qty 1

## 2021-11-26 MED ORDER — LOSARTAN POTASSIUM 25 MG PO TABS
25.0000 mg | ORAL_TABLET | Freq: Every day | ORAL | Status: DC
  Administered 2021-11-26 – 2021-11-27 (×2): 25 mg via ORAL
  Filled 2021-11-26 (×2): qty 1

## 2021-11-26 MED ORDER — SODIUM CHLORIDE 0.9 % WEIGHT BASED INFUSION
1.0000 mL/kg/h | INTRAVENOUS | Status: DC
  Administered 2021-11-27: 1 mL/kg/h via INTRAVENOUS

## 2021-11-26 MED ORDER — SODIUM CHLORIDE 0.9 % IV SOLN: 250.0000 mL | INTRAVENOUS | Status: DC | PRN

## 2021-11-26 MED ORDER — ACETAMINOPHEN 325 MG PO TABS: 650.0000 mg | ORAL_TABLET | ORAL | Status: DC | PRN

## 2021-11-26 MED ORDER — HEPARIN (PORCINE) 25000 UT/250ML-% IV SOLN
1100.0000 [IU]/h | INTRAVENOUS | Status: DC
  Administered 2021-11-26 – 2021-11-27 (×2): 1100 [IU]/h via INTRAVENOUS
  Filled 2021-11-26 (×2): qty 250

## 2021-11-26 MED ORDER — SPIRONOLACTONE 25 MG PO TABS
25.0000 mg | ORAL_TABLET | Freq: Every day | ORAL | Status: DC
  Administered 2021-11-26 – 2021-11-27 (×2): 25 mg via ORAL
  Filled 2021-11-26 (×2): qty 1

## 2021-11-26 NOTE — H&P (Addendum)
Cardiology Admission History and Physical:   Patient ID: [name] MRN: [id number]; DOB: 1956/04/29   Admission date: 11/26/2021  PCP:  Glendale Chard, MD   [facility] Providers Cardiologist:  Sinclair Grooms, MD  Electrophysiologist:  Constance Haw, MD    Chief Complaint:  Chest pain  Patient Profile:   [name] is a 66 y.o. female with CAD status post DES to circumflex/LAD '16, chronic pericardial effusion, chronic pericarditis with pericardial rub, paroxysmal atrial fibrillation (on Eliquis and dofetilide), remote history of non-small cell lung cancer, pulmonary fibrosis, COPD, OSA and anemia who is being seen 11/26/2021 for the evaluation of chest pain/elevated troponin.  History of Present Illness:   Ms. Emry is a 66 year old female who is followed by Dr. Tamala Julian and Dr. Curt Bears as an outpatient.  Underwent cardiac catheterization in September 2016 with PCI/DES to mid circumflex and mid LAD.  Repeat cardiac catheterization February 2018 showed patent stents.  Underwent cardiac MRI 03/2017 which showed no evidence of myocarditis.  History of recurrent chest pain and musculoskeletal complaints raised the question of autoimmune disease.  Underwent work-up in September 2022 which was unremarkable with negative ANA, double-stranded DNA antibody and negative rheumatoid factor, ENA antibodies and extractible nuclear antibodies.   Underwent cardiac catheterization 01/2021 with proximal LAD focal stenosis of 20 to 30% just prior to long stent which was widely patent.  Does have jailed diagonal branch of 60%.  LAD was noted to be wraparound with perfusion to the distal third territory of PDA.  Large dominant circumflex vessel patent, along with patent stent in OM1.  Underwent video bronchoscopy with Dr. Elsworth Soho on 07/25/2021 in the setting of hemoptysis which was felt to be secondary to friable mucosa due to her Eliquis use.  She was last seen in the office on 08/2021 with Dr.  Tamala Julian reported still having difficulty with chest pain at rest and exertion. There was a positional component as well as exertional shortness of breath.  It was felt that sleep apnea may be contributing, but she had been having issues with hemoptysis and stated that pulmonology asked that she not complete sleep study at that time.  At this visit it was felt that the etiology of her chest pain was uncertain but could be related to microvascular disease.  It was recommended that she enroll in cardiac rehab and continue with sublingual nitroglycerin use.  She presented to the ED 2/6 with complaints of chest pain.  Stated that morning she was watching TV around 3 AM.  She felt a sudden onset of palpitations and checked her heart rate on her Apple Watch and noted rates in the 160s.  Also feels significantly short of breath with this.  She attempted to get up and go to the bathroom and felt lightheaded.  Had chest discomfort as well.  Given her symptoms were lingering she decided to come to the ED for further evaluation.  Reports that she was walking into the ED into the triage area her heart rate started to settle down.  Chest discomfort improved.  In the ED her labs showed sodium 141, potassium 4, creatinine 1.02, magnesium 1.9, high-sensitivity troponin 8>> 2537, WBC 5.4, hemoglobin 11.1.  Chest x-ray showed no acute findings.  EKG showed sinus rhythm, 93 bpm, nonspecific changes, prolonged QTc.    Past Medical History:  Diagnosis Date   Allergy    Anemia    Anemia    Aortic insufficiency    a. Mod by echo 05/2015.  Arthritis    "knees" (07/11/2015)   Atrial fibrillation (HCC)    On Eliquis   Carcinoma of hilus of lung ([place]) 01/20/2012   IIIB NSCL  Right hilar mass compressing esophagus/presenting with dysphagia Rx Surgery/RT/chemo dx January 2001   Coronary artery disease    a. Abnormal stress test -> LHc 06/2015 s/p DES to mCX and mLAD, residual D2 disease treated medially.   Diverticulitis     Elevated blood pressure    Erythema nodosum    Heart murmur    Insomnia    lung ca dx'd 10/1999   chemo/xrt comp 05/29/2000   Myalgia    Nephrolithiasis 2015   Obesity    Pericardial effusion    a. 05/2015 Echo: EF 55-60%, no rwma, Gr 1 DD, no effusion - but an effusion was seen on CT which was felt to be increased in size compared to 12/2014 - pericardium also thickened.    Pericarditis    a. Dx 05/2015.   Pneumothorax, spontaneous, tension 01/20/2012   October, 2010   Pulmonary fibrosis ([place]) 01/20/2012   Due to previous surgery and chest radiation for lung cancer   Restrictive lung disease    Type II diabetes mellitus [facility])     Past Surgical History:  Procedure Laterality Date   ABDOMINAL HYSTERECTOMY  10/21/2006   APPENDECTOMY  1969?   BRONCHIAL WASHINGS  08/03/2021   Procedure: BRONCHIAL WASHINGS;  Surgeon: Rigoberto Noel, MD;  Location: [place];  Service: Cardiopulmonary;;   CARDIAC CATHETERIZATION N/A 07/11/2015   Procedure: Left Heart Cath and Coronary Angiography;  Surgeon: Jettie Booze, MD;  Location: [place] [facility];  Service: Cardiovascular;  Laterality: N/A;   CARDIAC CATHETERIZATION  07/11/2015   Procedure: Coronary Stent Intervention;  Surgeon: Jettie Booze, MD;  Location: [place] [facility];  Service: Cardiovascular;;   CARPAL TUNNEL RELEASE Bilateral 1998-2005?   right-left   CARPAL TUNNEL RELEASE Left 2009?   "for the 2nd time"   COLONOSCOPY     COLONOSCOPY WITH PROPOFOL N/A 05/28/2018   Procedure: COLONOSCOPY WITH PROPOFOL;  Surgeon: Milus Banister, MD;  Location: [facility];  Service: Endoscopy;  Laterality: N/A;   CORONARY ANGIOPLASTY     CYSTOSCOPY W/ STONE MANIPULATION  10/22/2011   ESOPHAGOGASTRODUODENOSCOPY (EGD) WITH ESOPHAGEAL DILATION  10/21/2010   FOOT SURGERY Bilateral 06/22/1979   Callous removed    INNER EAR SURGERY Right ~ 2009   KNEE ARTHROPLASTY Right 10/21/1989   KNEE ARTHROSCOPY  ~ 2000   LATERAL RELEASE   LEFT  HEART CATH AND CORONARY ANGIOGRAPHY N/A 12/18/2016   Procedure: Left Heart Cath and Coronary Angiography;  Surgeon: Burnell Blanks, MD;  Location: [place] [facility];  Service: Cardiovascular;  Laterality: N/A;   LEFT HEART CATH AND CORONARY ANGIOGRAPHY N/A 02/01/2021   Procedure: LEFT HEART CATH AND CORONARY ANGIOGRAPHY;  Surgeon: Leonie Man, MD;  Location: [place] [facility];  Service: Cardiovascular;  Laterality: N/A;   LUNG CANCER SURGERY Left 10/22/1999   "small cell"   LUNG SURGERY Right 10/21/2005   "reinflated it"   PULMONARY EMBOLISM SURGERY  10/21/2005   LUNG COLLAPSE   RIGHT AND LEFT HEART CATH N/A 06/25/2017   Procedure: RIGHT AND LEFT HEART CATH;  Surgeon: Sherren Mocha, MD;  Location: [place] [facility];  Service: Cardiovascular;  Laterality: N/A;   SHOULDER ADHESION RELEASE Right ~ 2004   SHOULDER ARTHROSCOPY W/ ROTATOR CUFF REPAIR Right ~ 2003   UPPER GASTROINTESTINAL ENDOSCOPY  06/05/2021  VIDEO BRONCHOSCOPY N/A 08/03/2021   Procedure: VIDEO BRONCHOSCOPY WITHOUT FLUORO;  Surgeon: Rigoberto Noel, MD;  Location: [place];  Service: Cardiopulmonary;  Laterality: N/A;     Medications Prior to Admission: Prior to Admission medications   Medication Sig Start Date End Date Taking? Authorizing Provider  acetaminophen (TYLENOL) 650 MG CR tablet Take 1,300 mg by mouth every 8 (eight) hours as needed for pain (headache).   Yes [provider]  azaTHIOprine (IMURAN) 50 MG tablet Take 150 mg by mouth every morning. 07/04/20  Yes [provider]  co-enzyme Q-10 50 MG capsule TAKE 1 CAPSULE BY MOUTH EVERY DAY Patient taking differently: Take 50 mg by mouth every morning. 11/30/19  Yes Dorothy Spark, MD  diltiazem (CARDIZEM CD) 360 MG 24 hr capsule TAKE 1 CAPSULE BY MOUTH EVERY DAY Patient taking differently: 360 mg every morning. 06/19/21  Yes Camnitz, Will Hassell Done, MD  diphenhydrAMINE (BENADRYL) 25 MG tablet Take 50 mg by mouth at bedtime as  needed for allergies.   Yes [provider]  diphenhydramine-acetaminophen (TYLENOL PM) 25-500 MG TABS tablet Take 2 tablets by mouth at bedtime as needed (sleep).   Yes [provider]  dofetilide (TIKOSYN) 500 MCG capsule TAKE 1 CAPSULE (500 MCG TOTAL) BY MOUTH 2 (TWO) TIMES DAILY. 01/17/21  Yes Camnitz, Will Hassell Done, MD  ELIQUIS 5 MG TABS tablet TAKE 1 TABLET BY MOUTH TWICE A DAY Patient taking differently: Take 5 mg by mouth 2 (two) times daily. 06/18/21  Yes Camnitz, Will Hassell Done, MD  folic acid (FOLVITE) 1 MG tablet Take 1 mg by mouth every morning. 12/02/19  Yes [provider]  furosemide (LASIX) 40 MG tablet Take 1 tablet (40 mg total) by mouth daily as needed for fluid or edema. 03/15/21  Yes Belva Crome, MD  Insulin Lispro Prot & Lispro (HUMALOG 75/25 MIX) (75-25) 100 UNIT/ML Kwikpen Inject 35-40 Units into the skin See admin instructions. Inject 40 units subcutaneously every morning and 35 units at night   Yes [provider]  levothyroxine (SYNTHROID, LEVOTHROID) 50 MCG tablet Take 50 mcg by mouth daily before breakfast.  05/14/17  Yes [provider]  losartan (COZAAR) 25 MG tablet TAKE 1 TABLET BY MOUTH EVERY DAY Patient taking differently: Take 25 mg by mouth every morning. 11/20/21  Yes Belva Crome, MD  metoprolol succinate (TOPROL-XL) 50 MG 24 hr tablet Take 1.5 tablets (75 mg total) by mouth at bedtime. Take with or immediately following a meal. Patient taking differently: Take 50 mg by mouth at bedtime. Take with or immediately following a meal. 11/20/21  Yes Belva Crome, MD  nitroGLYCERIN (NITROSTAT) 0.4 MG SL tablet Place 1 tablet (0.4 mg total) under the tongue every 5 (five) minutes as needed for chest pain. 08/30/21 11/28/21 Yes Belva Crome, MD  pravastatin (PRAVACHOL) 20 MG tablet TAKE 1 TABLET BY MOUTH EVERY DAY Patient taking differently: Take 20 mg by mouth at bedtime. 11/20/21  Yes Belva Crome, MD  spironolactone  (ALDACTONE) 25 MG tablet Take 1 tablet (25 mg total) by mouth daily. Patient taking differently: Take 25 mg by mouth every morning. 06/18/21  Yes Tillery, Satira Mccallum, PA-C  Accu-Chek Softclix Lancets lancets USE LANCETS 3 TIMES A DAY AS DIRECTED 30 DAYS 11/12/19   [provider]  B-D UF III MINI PEN NEEDLES 31G X 5 MM MISC 2 (two) times daily. as directed 10/31/18   [provider]  Blood Glucose Monitoring Suppl (ACCU-CHEK GUIDE) w/Device KIT  See admin instructions. 11/12/19   [provider]  OZEMPIC, 0.25 OR 0.5 MG/DOSE, 2 MG/1.5ML SOPN INJECT 0.5 MG INTO THE SKIN ONCE A WEEK. Patient not taking: Reported on 11/26/2021 03/30/19   Glendale Chard, MD     Allergies:    Allergies  Allergen Reactions   Hydrocodone Hives and Itching   Morphine And Related Other (See Comments)    GI PROBLEMS   Canagliflozin Diarrhea   Metformin Hcl Diarrhea   Rosuvastatin Other (See Comments)    Pt reports causes lower extremity muscle aches/cramping   Lisinopril Cough   Nsaids Other (See Comments)    GI PROBLEMS    Social History:   Social History   Socioeconomic History   Marital status: Single    Spouse name: Not on file   Number of children: 0   Years of education: Not on file   Highest education level: Not on file  Occupational History   Occupation: RETIRED  Tobacco Use   Smoking status: Former    Packs/day: 0.25    Years: 5.00    Pack years: 1.25    Types: Cigarettes    Quit date: 07/22/1999    Years since quitting: 22.3   Smokeless tobacco: Never  Vaping Use   Vaping Use: Never used  Substance and Sexual Activity   Alcohol use: No    Alcohol/week: 0.0 standard drinks   Drug use: No   Sexual activity: Yes    Birth control/protection: Other-see comments    Comment: hysterectomy  Other Topics Concern   Not on file  Social History Narrative   Not on file   Social Determinants of Health   Financial Resource Strain: Low Risk    Difficulty of Paying  Living Expenses: Not hard at all  Food Insecurity: No Food Insecurity   Worried About Charity fundraiser in the Last Year: Never true   Ran Out of Food in the Last Year: Never true  Transportation Needs: No Transportation Needs   Lack of Transportation (Medical): No   Lack of Transportation (Non-Medical): No  Physical Activity: Inactive   Days of Exercise per Week: 0 days   Minutes of Exercise per Session: 0 min  Stress: No Stress Concern Present   Feeling of Stress : Not at all  Social Connections: Not on file  Intimate Partner Violence: Not on file    Family History:   The patient's family history includes Diabetes in her father, maternal grandmother, mother, paternal grandmother, sister, sister, sister, and sister; Heart disease in her father; Hypertension in her sister; Kidney disease in her sister; Pancreatic cancer in her mother. There is no history of Colon cancer, Colon polyps, Rectal cancer, Stomach cancer, Esophageal cancer, or Breast cancer.    ROS:  Please see the history of present illness.  All other ROS reviewed and negative.     Physical Exam/Data:   Vitals:   11/26/21 1015 11/26/21 1030 11/26/21 1045 11/26/21 1100  BP: (!) 105/57 119/68 119/62 (!) 121/58  Pulse: (!) 57 64 (!) 52 72  Resp: _0 (!) 21  Temp:      TempSrc:      SpO2: 99% 99% 95% 100%  Weight:      Height:       No intake or output data in the 24 hours ending 11/26/21 1125 Last 3 Weights 11/26/2021 08/30/2021 08/03/2021  Weight (lbs) 232 lb 241 lb 9.6 oz 234 lb  Weight (kg) 105.235 kg 109.589 kg 106.142 kg  Body mass index is 35.8 kg/m.  General:  Well nourished, well developed, in no acute distress HEENT: normal Neck: no JVD Vascular: No carotid bruits; Distal pulses 2+ bilaterally   Cardiac:  normal S1, S2; RRR; soft systolic murmur  Lungs:  clear to auscultation bilaterally, no wheezing, rhonchi or rales  Abd: soft, nontender, no hepatomegaly  Ext: no edema Musculoskeletal:  No  deformities, BUE and BLE strength normal and equal Skin: warm and dry  Neuro:  CNs 2-12 intact, no focal abnormalities noted Psych:  Normal affect    EKG:  The ECG that was done 11/26/21 was personally reviewed and demonstrates sinus rhythm, 93 bpm, nonspecific changes, prolonged QTc  Relevant CV Studies:  Cath: 01/2021  The left ventricular systolic function is normal. The left ventricular ejection fraction is 55-65% by visual estimate. LV end diastolic pressure is normal. Prox LAD lesion is 30% stenosed just prior to long stent Prox LAD to Mid LAD long DES stent (70m) is 5% stenosed. The jailed 1st Diag lesion is 50% stenosed. Prox Cx to Mid Cx DES Stent is 20% stenosed. 1st Mrg lesion is 20% stenosed.   SUMMARY Left Dominant System with Two-Vessel Disease: Proximal LAD focal 20 to 30% calcified nodule just prior to a long stent that is widely patent.  The jailed diagonal branch has 60% stenosis.  LAD is wraparound perfuses the distal third of the PDA territory Large dominant LCx with 1 major OM 1, smaller OM 2 before going to the AV groove to give a PL 1 and PDA.  Just after OM1 there is a widely patent stent Preserved LVEF with normal EDP.   RECOMMENDATIONS Consider nonanginal chest pain -> potentially could have residual findings of pericarditis. Discharge after bedrest    DGlenetta Hew MD  Diagnostic Dominance: Left   Echo: 06/2021  IMPRESSIONS     1. Left ventricular ejection fraction, by estimation, is 60 to 65%. The  left ventricle has normal function. The left ventricle has no regional  wall motion abnormalities. There is mild left ventricular hypertrophy.  Left ventricular diastolic parameters  are consistent with Grade II diastolic dysfunction (pseudonormalization).   2. Right ventricular systolic function is normal. The right ventricular  size is normal. There is normal pulmonary artery systolic pressure. The  estimated right ventricular systolic pressure is  367.2mmHg.   3. Left atrial size was mildly dilated.   4. Right atrial size was mildly dilated.   5. A small pericardial effusion is present. No evidence for tamponade. No  evidence for effusive/constrictive pericarditis (normal IVC size, no  respirophasic variation of the interventricular septal position).   6. The mitral valve is normal in structure. No evidence of mitral valve  regurgitation. No evidence of mitral stenosis.   7. The aortic valve is tricuspid. Aortic valve regurgitation is moderate.  No aortic stenosis is present.   8. The inferior vena cava is normal in size with greater than 50%  respiratory variability, suggesting right atrial pressure of 3 mmHg.   FINDINGS   Left Ventricle: Left ventricular ejection fraction, by estimation, is 60  to 65%. The left ventricle has normal function. The left ventricle has no  regional wall motion abnormalities. The left ventricular internal cavity  size was normal in size. There is   mild left ventricular hypertrophy. Left ventricular diastolic parameters  are consistent with Grade II diastolic dysfunction (pseudonormalization).   Right Ventricle: The right ventricular size is normal. No increase in  right ventricular wall thickness.  Right ventricular systolic function is  normal. There is normal pulmonary artery systolic pressure. The tricuspid  regurgitant velocity is 2.72 m/s, and   with an assumed right atrial pressure of 3 mmHg, the estimated right  ventricular systolic pressure is 86.7 mmHg.   Left Atrium: Left atrial size was mildly dilated.   Right Atrium: Right atrial size was mildly dilated.   Pericardium: A small pericardial effusion is present.   Mitral Valve: The mitral valve is normal in structure. No evidence of  mitral valve regurgitation. No evidence of mitral valve stenosis.   Tricuspid Valve: The tricuspid valve is normal in structure. Tricuspid  valve regurgitation is trivial.   Aortic Valve: The aortic  valve is tricuspid. Aortic valve regurgitation is  moderate. Aortic regurgitation PHT measures 468 msec. No aortic stenosis  is present. Aortic valve mean gradient measures 7.0 mmHg. Aortic valve  peak gradient measures 12.4 mmHg.  Aortic valve area, by VTI measures 2.57 cm.   Pulmonic Valve: The pulmonic valve was normal in structure. Pulmonic valve  regurgitation is mild.   Aorta: The aortic root is normal in size and structure.   Venous: The inferior vena cava is normal in size with greater than 50%  respiratory variability, suggesting right atrial pressure of 3 mmHg.   IAS/Shunts: No atrial level shunt detected by color flow Doppler.   Laboratory Data:  High Sensitivity Troponin:   Recent Labs  Lab 11/26/21 0524 11/26/21 0800  TROPONINIHS 8 2,537*      Chemistry Recent Labs  Lab 11/26/21 0524 11/26/21 0800  NA 141  --   K 4.0  --   CL 108  --   CO2 27  --   GLUCOSE 123*  --   BUN 17  --   CREATININE 1.02*  --   CALCIUM 9.1  --   MG  --  1.9  GFRNONAA >60  --   ANIONGAP 6  --     No results for input(s): PROT, ALBUMIN, AST, ALT, ALKPHOS, BILITOT in the last 168 hours. Lipids No results for input(s): CHOL, TRIG, HDL, LABVLDL, LDLCALC, CHOLHDL in the last 168 hours. Hematology Recent Labs  Lab 11/26/21 0704  WBC 5.4  RBC 3.47*  HGB 11.1*  HCT 35.1*  MCV 101.2*  MCH 32.0  MCHC 31.6  RDW 12.3  PLT 340   Thyroid  Recent Labs  Lab 11/26/21 0814  TSH 3.167   BNPNo results for input(s): BNP, PROBNP in the last 168 hours.  DDimer No results for input(s): DDIMER in the last 168 hours.   Radiology/Studies:  DG Chest 2 View  Result Date: 11/26/2021 CLINICAL DATA:  66 year old female with history of chest pain and shortness of breath. EXAM: CHEST - 2 VIEW COMPARISON:  Chest x-ray 06/14/2021. FINDINGS: Chronic elevation of the left hemidiaphragm. Chronic pleuroparenchymal thickening and architectural distortion in the apex of the left hemithorax where there  is also a suture line and several surgical clips. Widespread but patchy areas of interstitial prominence scattered throughout the lungs bilaterally, along with rather diffuse peribronchial cuffing, chronic and similar to prior study. No acute consolidative airspace disease. No pleural effusions. No pneumothorax. No evidence of pulmonary edema. Heart size is upper limits of normal and distorted by elevated left hemidiaphragm, similar to prior studies. IMPRESSION: 1. Chronic changes in the chest, stable compared to the prior examination, without radiographic evidence of acute cardiopulmonary disease, as detailed above. Electronically Signed   By: Vinnie Langton M.D.   On: 11/26/2021  05:45     Assessment and Plan:   KANETRA HO is a 66 y.o. female with CAD status post DES to circumflex/LAD '16, chronic pericardial effusion, chronic pericarditis with pericardial rub, paroxysmal atrial fibrillation (on Eliquis and dofetilide), remote history of non-small cell lung cancer, pulmonary fibrosis, COPD, OSA and anemia who is being seen 11/26/2021 for the evaluation of chest pain/elevated troponin.  Chest pain/NSTEMI: presented to the ED with sudden onset of chest pain this morning around 3am, though this was in the setting of palpitations as she reported a HR in the 160s on her apple watch. Initial hsTn 8>>2537. EKG without acute ST/T wave changes. Started on IV heparin in the ED. Discussed need for cardiac cath. Her last dose of Eliquis was 9pm last evening, therefore will need to wait until tomorrow for cath. Suspect this could be demand ischemia from her episode of tachycardia but given the degree of troponin elevation, would rule out obstructive CAD. -- admit -- cycle troponin -- IV heparin -- ASA, statin, BB  Shared Decision Making/Informed Consent The risks [stroke (1 in 1000), death (1 in 1000), kidney failure [usually temporary] (1 in 500), bleeding (1 in 200), allergic reaction [possibly serious] (1 in  200)], benefits (diagnostic support and management of coronary artery disease) and alternatives of a cardiac catheterization were discussed in detail with Ms. Rooks and she is willing to proceed.  CAD s/p DES to Lcx/LAD: '16, did have cath 01/2021 with patent stents -- continue ASA, statin, BB  Paroxsymal Afib: follows with Dr. Curt Bears, notes indicate Afib burden has been low in the past.  -- on Dofetilide, Diltiazem 311m daily, Toprol 726m -- if she is having break  through afib may need EP input regarding management -- holding Eliquis, continue IV heparin  Chronic pericardial effusion: last echo 06/2021 with small pericardial effusion -- repeat echo  Recurrent pericarditis: prior autoimmune work up in the past has been negative  HLD: multiple statins intolerance in the past -- check lipids -- on pravastatin   HTN: stable blood pressures -- continue Toprol, Diltiazem, spiro  DM: SSI -- check Hgb A1c  Hypothyroidism: continue synthroid   Risk Assessment/Risk Scores:   TIMI Risk Score for Unstable Angina or Non-ST Elevation MI:   The patient's TIMI risk score is 4, which indicates a 20% risk of all cause mortality, new or recurrent myocardial infarction or need for urgent revascularization in the next 14 days.    CHA2DS2-VASc Score = 3  This indicates a 3.2% annual risk of stroke. The patient's score is based upon: CHF History: 0 HTN History: 1 Diabetes History: 0 Stroke History: 0 Vascular Disease History: 1 Age Score: 0 Gender Score: 1   Severity of Illness: The appropriate patient status for this patient is OBSERVATION. Observation status is judged to be reasonable and necessary in order to provide the required intensity of service to ensure the patient's safety. The patient's presenting symptoms, physical exam findings, and initial radiographic and laboratory data in the context of their medical condition is felt to place them at decreased risk for further clinical  deterioration. Furthermore, it is anticipated that the patient will be medically stable for discharge from the hospital within 2 midnights of admission.    For questions or updates, please contact [facility] consult [email or web address] for contact info under     Signed, LiReino BellisNP  11/26/2021 11:25 AM    I have examined the patient and reviewed assessment and plan and discussed with  patient.  Agree with above as stated.    Sx were likely breakthrough AFib despite Tikosyn.  Troponin elevated- ACS vs demand ischemia from tachy. Pain improved.  Plan for cath tomorrow- last dose of ELiquis was 2/5 PM.  4/22 cath was reassuring. On Eliquis. Has had persistent angina even after cath showed patent stents.  ?microvascular disease.   Will get EP opinion to see if ablation should be considered.   She has an apple watch.  It would be helpful to be able to see the ECG strips from the watch.  Not sure if we can get this information in the hospital.  Larae Grooms

## 2021-11-26 NOTE — Progress Notes (Signed)
°  Echocardiogram 2D Echocardiogram has been performed.  Johny Chess 11/26/2021, 4:11 PM

## 2021-11-26 NOTE — ED Notes (Signed)
RN Elmyra Ricks and dr Doren Custard notified of critical trop. Results MD at bedside

## 2021-11-26 NOTE — ED Provider Notes (Signed)
[facility] EMERGENCY DEPARTMENT Provider Note   CSN: [id number] Arrival date & time: 11/26/21  6568     History  Chief Complaint  Patient presents with   Chest Pain   Shortness of Breath    [name] is a 66 y.o. female.   Chest Pain Associated symptoms: palpitations and shortness of breath   Shortness of Breath Associated symptoms: chest pain   Patient presenting for palpitations, chest pain, shortness of breath since 3 AM this morning.  Medical history is notable for CAD, chronic pericarditis, DM2, anemia, HTN, HLD, atrial fibrillation, CHF, hypothyroidism, remote NSCLC, pulmonary fibrosis, COPD, and aortic insufficiency.  She is followed by [facility].  She had stents placed in 2016.  She had a repeat cath in 2018.  She is on Eliquis and Tikosyn for her atrial fibrillation.  She has been compliant with medications.  This morning, at approximately 3 AM, she was awake watching TV.  She experienced palpitations, chest discomfort, and shortness of breath.  She checked her heart rate on her watch and it was in the range of 150.  She came to the ED.  Since she has been in the ED waiting room, she has had intermittent symptoms of palpitations and elevated heart rate on her watch.  Currently, she is not experiencing these palpitations.  She does endorse very mild persistent chest discomfort as well as continued shortness of breath.  Prior to going to bed last night, she was in her normal state of health.    Home Medications Prior to Admission medications   Medication Sig Start Date End Date Taking? Authorizing Provider  acetaminophen (TYLENOL) 650 MG CR tablet Take 1,300 mg by mouth every 8 (eight) hours as needed for pain (headache).   Yes [provider]  azaTHIOprine (IMURAN) 50 MG tablet Take 150 mg by mouth every morning. 07/04/20  Yes [provider]  co-enzyme Q-10 50 MG capsule TAKE 1 CAPSULE BY MOUTH EVERY DAY Patient taking differently: Take 50 mg  by mouth every morning. 11/30/19  Yes Dorothy Spark, MD  diltiazem (CARDIZEM CD) 360 MG 24 hr capsule TAKE 1 CAPSULE BY MOUTH EVERY DAY Patient taking differently: 360 mg every morning. 06/19/21  Yes Camnitz, Will Hassell Done, MD  diphenhydrAMINE (BENADRYL) 25 MG tablet Take 50 mg by mouth at bedtime as needed for allergies.   Yes [provider]  diphenhydramine-acetaminophen (TYLENOL PM) 25-500 MG TABS tablet Take 2 tablets by mouth at bedtime as needed (sleep).   Yes [provider]  dofetilide (TIKOSYN) 500 MCG capsule TAKE 1 CAPSULE (500 MCG TOTAL) BY MOUTH 2 (TWO) TIMES DAILY. 01/17/21  Yes Camnitz, Will Hassell Done, MD  ELIQUIS 5 MG TABS tablet TAKE 1 TABLET BY MOUTH TWICE A DAY Patient taking differently: Take 5 mg by mouth 2 (two) times daily. 06/18/21  Yes Camnitz, Will Hassell Done, MD  folic acid (FOLVITE) 1 MG tablet Take 1 mg by mouth every morning. 12/02/19  Yes [provider]  furosemide (LASIX) 40 MG tablet Take 1 tablet (40 mg total) by mouth daily as needed for fluid or edema. 03/15/21  Yes Belva Crome, MD  Insulin Lispro Prot & Lispro (HUMALOG 75/25 MIX) (75-25) 100 UNIT/ML Kwikpen Inject 35-40 Units into the skin See admin instructions. Inject 40 units subcutaneously every morning and 35 units at night   Yes [provider]  levothyroxine (SYNTHROID, LEVOTHROID) 50 MCG tablet Take 50 mcg by mouth daily before breakfast.  05/14/17  Yes [provider]  losartan (COZAAR) 25 MG tablet TAKE 1 TABLET BY MOUTH EVERY DAY Patient taking differently: Take 25 mg by mouth every morning. 11/20/21  Yes Belva Crome, MD  metoprolol succinate (TOPROL-XL) 50 MG 24 hr tablet Take 1.5 tablets (75 mg total) by mouth at bedtime. Take with or immediately following a meal. Patient taking differently: Take 50 mg by mouth at bedtime. Take with or immediately following a meal. 11/20/21  Yes Belva Crome, MD  nitroGLYCERIN (NITROSTAT) 0.4 MG SL tablet Place 1 tablet (0.4 mg  total) under the tongue every 5 (five) minutes as needed for chest pain. 08/30/21 11/28/21 Yes Belva Crome, MD  pravastatin (PRAVACHOL) 20 MG tablet TAKE 1 TABLET BY MOUTH EVERY DAY Patient taking differently: Take 20 mg by mouth at bedtime. 11/20/21  Yes Belva Crome, MD  spironolactone (ALDACTONE) 25 MG tablet Take 1 tablet (25 mg total) by mouth daily. Patient taking differently: Take 25 mg by mouth every morning. 06/18/21  Yes Tillery, Satira Mccallum, PA-C  Accu-Chek Softclix Lancets lancets USE LANCETS 3 TIMES A DAY AS DIRECTED 30 DAYS 11/12/19   [provider]  B-D UF III MINI PEN NEEDLES 31G X 5 MM MISC 2 (two) times daily. as directed 10/31/18   [provider]  Blood Glucose Monitoring Suppl (ACCU-CHEK GUIDE) w/Device KIT See admin instructions. 11/12/19   [provider]  OZEMPIC, 0.25 OR 0.5 MG/DOSE, 2 MG/1.5ML SOPN INJECT 0.5 MG INTO THE SKIN ONCE A WEEK. Patient not taking: Reported on 11/26/2021 03/30/19   Glendale Chard, MD      Allergies    Hydrocodone, Morphine and related, Canagliflozin, Metformin hcl, Rosuvastatin, Lisinopril, and Nsaids    Review of Systems   Review of Systems  Respiratory:  Positive for shortness of breath.   Cardiovascular:  Positive for chest pain and palpitations.  All other systems reviewed and are negative.  Physical Exam Updated Vital Signs BP (!) 124/57 (BP Location: Right Arm)    Pulse 74    Temp 98.6 F (37 C) (Oral)    Resp 18    Ht 5' 7.5" (1.715 m)    Wt 106.9 kg    SpO2 98%    BMI 36.36 kg/m  Physical Exam Vitals and nursing note reviewed.  Constitutional:      General: She is not in acute distress.    Appearance: She is well-developed. She is not ill-appearing, toxic-appearing or diaphoretic.  HENT:     Head: Normocephalic and atraumatic.  Eyes:     Conjunctiva/sclera: Conjunctivae normal.  Cardiovascular:     Rate and Rhythm: Normal rate and regular rhythm.     Heart sounds: No murmur heard.   No friction  rub.  Pulmonary:     Effort: Pulmonary effort is normal. No respiratory distress.     Breath sounds: Examination of the left-lower field reveals decreased breath sounds. Decreased breath sounds present. No wheezing, rhonchi or rales.  Chest:     Chest wall: Tenderness present.  Abdominal:     Palpations: Abdomen is soft.     Tenderness: There is no abdominal tenderness.  Musculoskeletal:        General: No swelling.     Cervical back: Normal range of motion and neck supple.     Right lower leg: No edema.     Left lower leg: No edema.  Skin:    General: Skin is warm and dry.     Capillary Refill: Capillary refill takes less than  2 seconds.  Neurological:     General: No focal deficit present.     Mental Status: She is alert and oriented to person, place, and time.     Cranial Nerves: No cranial nerve deficit.     Motor: No weakness.  Psychiatric:        Mood and Affect: Mood normal.        Behavior: Behavior normal.    ED Results / Procedures / Treatments   Labs (all labs ordered are listed, but only abnormal results are displayed) Labs Reviewed  BASIC METABOLIC PANEL - Abnormal; Notable for the following components:      Result Value   Glucose, Bld 123 (*)    Creatinine, Ser 1.02 (*)    All other components within normal limits  CBC - Abnormal; Notable for the following components:   RBC 3.47 (*)    Hemoglobin 11.1 (*)    HCT 35.1 (*)    MCV 101.2 (*)    All other components within normal limits  APTT - Abnormal; Notable for the following components:   aPTT 59 (*)    All other components within normal limits  HEPARIN LEVEL (UNFRACTIONATED) - Abnormal; Notable for the following components:   Heparin Unfractionated 0.99 (*)    All other components within normal limits  HEPARIN LEVEL (UNFRACTIONATED) - Abnormal; Notable for the following components:   Heparin Unfractionated 1.01 (*)    All other components within normal limits  CBC - Abnormal; Notable for the following  components:   RBC 3.29 (*)    Hemoglobin 10.9 (*)    HCT 32.8 (*)    All other components within normal limits  APTT - Abnormal; Notable for the following components:   aPTT 152 (*)    All other components within normal limits  HEMOGLOBIN A1C - Abnormal; Notable for the following components:   Hgb A1c MFr Bld 6.1 (*)    All other components within normal limits  BASIC METABOLIC PANEL - Abnormal; Notable for the following components:   Glucose, Bld 178 (*)    Calcium 8.8 (*)    All other components within normal limits  APTT - Abnormal; Notable for the following components:   aPTT 110 (*)    All other components within normal limits  HEPARIN LEVEL (UNFRACTIONATED) - Abnormal; Notable for the following components:   Heparin Unfractionated 0.88 (*)    All other components within normal limits  GLUCOSE, CAPILLARY - Abnormal; Notable for the following components:   Glucose-Capillary 186 (*)    All other components within normal limits  CBG MONITORING, ED - Abnormal; Notable for the following components:   Glucose-Capillary 110 (*)    All other components within normal limits  TROPONIN I (HIGH SENSITIVITY) - Abnormal; Notable for the following components:   Troponin I (High Sensitivity) 2,537 (*)    All other components within normal limits  SARS CORONAVIRUS 2 BY RT PCR (HOSPITAL ORDER, [place] [facility])  TSH  MAGNESIUM  HIV ANTIBODY (ROUTINE TESTING W REFLEX)  TSH  LIPID PANEL  TROPONIN I (HIGH SENSITIVITY)  TROPONIN I (HIGH SENSITIVITY)  TROPONIN I (HIGH SENSITIVITY)    EKG EKG Interpretation  Date/Time:  Monday November 26 2021 05:20:08 EST Ventricular Rate:  93 PR Interval:  120 QRS Duration: 84 QT Interval:  400 QTC Calculation: 497 R Axis:   66 Text Interpretation: Normal sinus rhythm Septal infarct , age undetermined Confirmed by Godfrey Pick [phone] on 11/26/2021 7:23:40 AM  Radiology DG Chest 2 View  Result Date: 11/26/2021 CLINICAL DATA:   66 year old female with history of chest pain and shortness of breath. EXAM: CHEST - 2 VIEW COMPARISON:  Chest x-ray 06/14/2021. FINDINGS: Chronic elevation of the left hemidiaphragm. Chronic pleuroparenchymal thickening and architectural distortion in the apex of the left hemithorax where there is also a suture line and several surgical clips. Widespread but patchy areas of interstitial prominence scattered throughout the lungs bilaterally, along with rather diffuse peribronchial cuffing, chronic and similar to prior study. No acute consolidative airspace disease. No pleural effusions. No pneumothorax. No evidence of pulmonary edema. Heart size is upper limits of normal and distorted by elevated left hemidiaphragm, similar to prior studies. IMPRESSION: 1. Chronic changes in the chest, stable compared to the prior examination, without radiographic evidence of acute cardiopulmonary disease, as detailed above. Electronically Signed   By: Vinnie Langton M.D.   On: 11/26/2021 05:45   ECHOCARDIOGRAM COMPLETE  Result Date: 11/26/2021    ECHOCARDIOGRAM REPORT   Patient Name:   LEGACI TARMAN Date of Exam: 11/26/2021 Medical Rec #:  [id number]     Height:       67.5 in Accession #:    [id number]    Weight:       232.0 lb Date of Birth:  1956/06/09     BSA:          2.166 m Patient Age:    34 years      BP:           128/50 mmHg Patient Gender: F             HR:           64 bpm. Exam Location:  Inpatient Procedure: 2D Echo Indications:    chest pain  History:        Patient has prior history of Echocardiogram examinations, most                 recent 07/09/2021. CAD, Arrythmias:Atrial Fibrillation; Risk                 Factors:Diabetes.  Sonographer:    Johny Chess RDCS Referring Phys: [place] Comments: Image acquisition challenging due to patient body habitus. IMPRESSIONS  1. Left ventricular ejection fraction, by estimation, is 60 to 65%. The left ventricle has normal function. The left  ventricle has no regional wall motion abnormalities. Left ventricular diastolic parameters are consistent with Grade I diastolic dysfunction (impaired relaxation).  2. Right ventricular systolic function is normal. The right ventricular size is normal. There is normal pulmonary artery systolic pressure.  3. The mitral valve is normal in structure. No evidence of mitral valve regurgitation. No evidence of mitral stenosis.  4. The aortic valve is normal in structure. Aortic valve regurgitation is moderate. No aortic stenosis is present. Aortic regurgitation PHT measures 345 msec.  5. The inferior vena cava is normal in size with greater than 50% respiratory variability, suggesting right atrial pressure of 3 mmHg. FINDINGS  Left Ventricle: Left ventricular ejection fraction, by estimation, is 60 to 65%. The left ventricle has normal function. The left ventricle has no regional wall motion abnormalities. The left ventricular internal cavity size was normal in size. There is  no left ventricular hypertrophy. Left ventricular diastolic parameters are consistent with Grade I diastolic dysfunction (impaired relaxation). Right Ventricle: The right ventricular size is normal. No increase in right ventricular wall thickness. Right ventricular systolic function is normal. There  is normal pulmonary artery systolic pressure. The tricuspid regurgitant velocity is 2.51 m/s, and  with an assumed right atrial pressure of 3 mmHg, the estimated right ventricular systolic pressure is 61.6 mmHg. Left Atrium: Left atrial size was normal in size. Right Atrium: Right atrial size was normal in size. Pericardium: There is no evidence of pericardial effusion. Mitral Valve: The mitral valve is normal in structure. No evidence of mitral valve regurgitation. No evidence of mitral valve stenosis. Tricuspid Valve: The tricuspid valve is normal in structure. Tricuspid valve regurgitation is not demonstrated. No evidence of tricuspid stenosis. Aortic  Valve: The aortic valve is normal in structure. Aortic valve regurgitation is moderate. Aortic regurgitation PHT measures 345 msec. No aortic stenosis is present. Pulmonic Valve: The pulmonic valve was normal in structure. Pulmonic valve regurgitation is mild. No evidence of pulmonic stenosis. Aorta: The aortic root is normal in size and structure. Venous: The inferior vena cava is normal in size with greater than 50% respiratory variability, suggesting right atrial pressure of 3 mmHg. IAS/Shunts: No atrial level shunt detected by color flow Doppler.  LEFT VENTRICLE PLAX 2D LVIDd:         5.00 cm   Diastology LVIDs:         3.00 cm   LV e' medial:    6.74 cm/s LV PW:         1.00 cm   LV E/e' medial:  9.9 LV IVS:        0.80 cm   LV e' lateral:   7.51 cm/s LVOT diam:     2.10 cm   LV E/e' lateral: 8.9 LV SV:         92 LV SV Index:   43 LVOT Area:     3.46 cm  RIGHT VENTRICLE             IVC RV S prime:     11.50 cm/s  IVC diam: 1.70 cm TAPSE (M-mode): 1.8 cm LEFT ATRIUM             Index        RIGHT ATRIUM           Index LA diam:        3.00 cm 1.39 cm/m   RA Area:     14.50 cm LA Vol (A2C):   46.5 ml 21.47 ml/m  RA Volume:   36.40 ml  16.81 ml/m LA Vol (A4C):   42.8 ml 19.76 ml/m LA Biplane Vol: 45.7 ml 21.10 ml/m  AORTIC VALVE LVOT Vmax:   130.00 cm/s LVOT Vmean:  82.600 cm/s LVOT VTI:    0.266 m AI PHT:      345 msec  AORTA Ao Root diam: 3.10 cm Ao Asc diam:  3.40 cm MITRAL VALVE               TRICUSPID VALVE MV Area (PHT): 3.48 cm    TR Peak grad:   25.2 mmHg MV Decel Time: 218 msec    TR Vmax:        251.00 cm/s MV E velocity: 66.70 cm/s MV A velocity: 78.50 cm/s  SHUNTS MV E/A ratio:  0.85        Systemic VTI:  0.27 m                            Systemic Diam: 2.10 cm Candee Furbish MD Electronically signed by Candee Furbish MD Signature Date/Time: 11/26/2021/4:53:34 PM  Final     Procedures Procedures    Medications Ordered in ED Medications  heparin ADULT infusion 100 units/mL (25000 units/223m)  (1,200 Units/hr Intravenous Rate/Dose Change 11/27/21 0228)  diltiazem (CARDIZEM CD) 24 hr capsule 360 mg (has no administration in time range)  dofetilide (TIKOSYN) capsule 500 mcg (500 mcg Oral Given 11/26/21 2042)  losartan (COZAAR) tablet 25 mg (25 mg Oral Given 11/26/21 1453)  metoprolol succinate (TOPROL-XL) 24 hr tablet 75 mg (75 mg Oral Given 11/26/21 2207)  pravastatin (PRAVACHOL) tablet 20 mg (20 mg Oral Given 11/26/21 1453)  spironolactone (ALDACTONE) tablet 25 mg (25 mg Oral Given 11/26/21 1453)  azaTHIOprine (IMURAN) tablet 150 mg (150 mg Oral Given 11/26/21 1453)  aspirin EC tablet 81 mg (has no administration in time range)  nitroGLYCERIN (NITROSTAT) SL tablet 0.4 mg (has no administration in time range)  acetaminophen (TYLENOL) tablet 650 mg (has no administration in time range)  ondansetron (ZOFRAN) injection 4 mg (has no administration in time range)  insulin aspart (novoLOG) injection 0-15 Units (0 Units Subcutaneous Not Given 11/26/21 1700)  sodium chloride flush (NS) 0.9 % injection 3 mL (3 mLs Intravenous Given 11/26/21 2208)  sodium chloride flush (NS) 0.9 % injection 3 mL (has no administration in time range)  0.9 %  sodium chloride infusion (has no administration in time range)  aspirin chewable tablet 81 mg (has no administration in time range)  0.9% sodium chloride infusion (3 mL/kg/hr  105.2 kg Intravenous New Bag/Given 11/27/21 0400)    Followed by  0.9% sodium chloride infusion (1 mL/kg/hr  105.2 kg Intravenous New Bag/Given 11/27/21 0502)  aspirin chewable tablet 324 mg (324 mg Oral Given 11/26/21 0953)  fentaNYL (SUBLIMAZE) injection 50 mcg (50 mcg Intravenous Given 11/26/21 08341    ED Course/ Medical Decision Making/ A&P                           Medical Decision Making Amount and/or Complexity of Data Reviewed Labs: ordered. Radiology: ordered.  Risk OTC drugs. Prescription drug management. Decision regarding hospitalization.   This patient presents to the ED for  concern of palpitations, chest pain, and shortness of breath, this involves an extensive number of treatment options, and is a complaint that carries with it a high risk of complications and morbidity.  The differential diagnosis includes CHF, cardiac valvular dysfunction, cardiac dysrhythmias, ACS, pneumonia, PE, acute anemia   Co morbidities that complicate the patient evaluation  CAD, chronic pericarditis, DM2, anemia, HTN, HLD, atrial fibrillation, CHF, hypothyroidism, remote NSCLC, pulmonary fibrosis, COPD, and aortic insufficiency   Additional history obtained:  Additional history obtained from N/A External records from outside source obtained and reviewed including EMR   Lab Tests:  I Ordered, and personally interpreted labs.  The pertinent results include: Baseline anemia, no leukocytosis, normal electrolytes, baseline kidney function, normal troponin   Imaging Studies ordered:  I ordered imaging studies including chest x-ray I independently visualized and interpreted imaging which showed no acute findings I agree with the radiologist interpretation   Cardiac Monitoring:  The patient was maintained on a cardiac monitor.  I personally viewed and interpreted the cardiac monitored which showed an underlying rhythm of: Sinus rhythm   Medicines ordered and prescription drug management:  I ordered medication including ASA and heparin for NSTEMI, fentanyl for analgesia Reevaluation of the patient after these medicines showed that the patient improved I have reviewed the patients home medicines and have made adjustments as needed  Consultations Obtained:  I requested consultation with the cardiologist,  and discussed lab and imaging findings as well as pertinent plan - they recommend: Admission   Problem List / ED Course:  66 year old female with history of CAD, chronic pericarditis, DM2, anemia, HTN, HLD, atrial fibrillation, CHF, hypothyroidism, remote NSCLC, pulmonary  fibrosis, COPD, and aortic insufficiency presenting for onset of palpitations, chest discomfort, and shortness of breath at 3 AM.  Symptoms have been waxing and waning in severity since onset.  During ED triage, EKG was obtained which showed normal sinus rhythm and no ST segment or T wave abnormalities.  Laboratory work-up was also initiated prior to being bedded in the ED.  Initial results are reassuring with normal electrolytes, no anemia, normal troponin, and baseline kidney function.  On initial assessment, patient appears mildly short of breath with talking.  She is able to complete full sentences and SPO2 is normal on room air.  Patient was placed on cardiac monitor and observed to assess for intermittent dysrhythmias.  Currently, she takes Tikosyn twice daily.  Last dose was last night.  Patient was kept on cardiac monitor and no episodes of tachycardia or dysrhythmias were identified.  Delta troponin was significantly elevated at 2537.  Patient was given 324 of ASA and heparin gtt. was ordered.  Cardiology was consulted.  Due to patient's ongoing mild chest discomfort, fentanyl was given for analgesia.  Patient was admitted to cardiology for further management.   Reevaluation:  After the interventions noted above, I reevaluated the patient and found that they have :improved   Social Determinants of Health:  Patient has access to outpatient care, including cardiologist   Dispostion:  After consideration of the diagnostic results and the patients response to treatment, I feel that the patent would benefit from admission.          Final Clinical Impression(s) / ED Diagnoses Final diagnoses:  NSTEMI (non-ST elevated myocardial infarction) [facility])    Rx / DC Orders ED Discharge Orders     None         Godfrey Pick, MD 11/27/21 [phone]

## 2021-11-26 NOTE — Progress Notes (Signed)
ANTICOAGULATION CONSULT NOTE - Initial Consult  Pharmacy Consult for heparin Indication: chest pain/ACS  Allergies  Allergen Reactions   Hydrocodone Hives and Itching   Morphine And Related Other (See Comments)    GI PROBLEMS   Canagliflozin Diarrhea   Metformin Hcl Diarrhea   Rosuvastatin Other (See Comments)    Pt reports causes lower extremity muscle aches/cramping   Lisinopril Cough   Nsaids Other (See Comments)    GI PROBLEMS    Patient Measurements: Height: 5' 7.5" (171.5 cm) Weight: 105.2 kg (232 lb) IBW/kg (Calculated) : 62.75 Heparin Dosing Weight: 86.5kg  Vital Signs: BP: 124/53 (02/06 1709) Pulse Rate: 81 (02/06 1709)  Labs: Recent Labs    11/26/21 0524 11/26/21 0704 11/26/21 0800 11/26/21 1125 11/26/21 1311 11/26/21 1457  HGB  --  11.1*  --   --   --   --   HCT  --  35.1*  --   --   --   --   PLT  --  340  --   --   --   --   APTT  --   --   --   --   --  59*  HEPARINUNFRC  --   --   --   --   --  0.99*  CREATININE 1.02*  --   --   --   --   --   TROPONINIHS 8  --  2,537* 10 11  --      Estimated Creatinine Clearance: 71.1 mL/min (A) (by C-G formula based on SCr of 1.02 mg/dL (H)).   Medical History: Past Medical History:  Diagnosis Date   Allergy    Anemia    Anemia    Aortic insufficiency    a. Mod by echo 05/2015.   Arthritis    "knees" (07/11/2015)   Atrial fibrillation (HCC)    On Eliquis   Carcinoma of hilus of lung ([place]) 01/20/2012   IIIB NSCL  Right hilar mass compressing esophagus/presenting with dysphagia Rx Surgery/RT/chemo dx January 2001   Coronary artery disease    a. Abnormal stress test -> LHc 06/2015 s/p DES to mCX and mLAD, residual D2 disease treated medially.   Diverticulitis    Elevated blood pressure    Erythema nodosum    Heart murmur    Insomnia    lung ca dx'd 10/1999   chemo/xrt comp 05/29/2000   Myalgia    Nephrolithiasis 2015   Obesity    Pericardial effusion    a. 05/2015 Echo: EF 55-60%, no rwma, Gr 1 DD,  no effusion - but an effusion was seen on CT which was felt to be increased in size compared to 12/2014 - pericardium also thickened.    Pericarditis    a. Dx 05/2015.   Pneumothorax, spontaneous, tension 01/20/2012   October, 2010   Pulmonary fibrosis ([place]) 01/20/2012   Due to previous surgery and chest radiation for lung cancer   Restrictive lung disease    Type II diabetes mellitus ([place])     Assessment: [name] presenting with CP and SOB, increased troponin, on Eliquis PTA for afib, last dose 2/5 @2100 .  Chronic anemia stable, plts 300s  HL/aPTT at 1457: 0.99/59  No issues noted  Goal of Therapy:  Heparin level 0.3-0.7 units/ml aPTT 66-102 seconds Monitor platelets by anticoagulation protocol: Yes   Plan:  Increase heparin gtt to 1250 units/hr F/u 6 hour aPTT/HL F/u cards eval and recs  Lorelei Pont, PharmD, BCPS 11/26/2021 5:52 PM  ED Clinical Pharmacist -  [phone]

## 2021-11-26 NOTE — Progress Notes (Signed)
ANTICOAGULATION CONSULT NOTE - Initial Consult  Pharmacy Consult for heparin Indication: chest pain/ACS  Allergies  Allergen Reactions   Hydrocodone Hives and Itching   Morphine And Related Other (See Comments)    GI PROBLEMS   Canagliflozin Diarrhea   Metformin Hcl Diarrhea   Rosuvastatin Other (See Comments)    Pt reports causes lower extremity muscle aches/cramping   Lisinopril Cough   Nsaids Other (See Comments)    GI PROBLEMS    Patient Measurements: Height: 5' 7.5" (171.5 cm) Weight: 105.2 kg (232 lb) IBW/kg (Calculated) : 62.75 Heparin Dosing Weight: 86.5kg  Vital Signs: Temp: 97.8 F (36.6 C) (02/06 0517) Temp Source: Oral (02/06 0517) BP: 123/57 (02/06 0900) Pulse Rate: 73 (02/06 0900)  Labs: Recent Labs    11/26/21 0524 11/26/21 0704 11/26/21 0800  HGB  --  11.1*  --   HCT  --  35.1*  --   PLT  --  340  --   CREATININE 1.02*  --   --   TROPONINIHS 8  --  2,537*    Estimated Creatinine Clearance: 71.1 mL/min (A) (by C-G formula based on SCr of 1.02 mg/dL (H)).   Medical History: Past Medical History:  Diagnosis Date   Allergy    Anemia    Anemia    Aortic insufficiency    a. Mod by echo 05/2015.   Arthritis    "knees" (07/11/2015)   Atrial fibrillation (HCC)    On Eliquis   Carcinoma of hilus of lung ([place]) 01/20/2012   IIIB NSCL  Right hilar mass compressing esophagus/presenting with dysphagia Rx Surgery/RT/chemo dx January 2001   Coronary artery disease    a. Abnormal stress test -> LHc 06/2015 s/p DES to mCX and mLAD, residual D2 disease treated medially.   Diverticulitis    Elevated blood pressure    Erythema nodosum    Heart murmur    Insomnia    lung ca dx'd 10/1999   chemo/xrt comp 05/29/2000   Myalgia    Nephrolithiasis 2015   Obesity    Pericardial effusion    a. 05/2015 Echo: EF 55-60%, no rwma, Gr 1 DD, no effusion - but an effusion was seen on CT which was felt to be increased in size compared to 12/2014 - pericardium also  thickened.    Pericarditis    a. Dx 05/2015.   Pneumothorax, spontaneous, tension 01/20/2012   October, 2010   Pulmonary fibrosis ([place]) 01/20/2012   Due to previous surgery and chest radiation for lung cancer   Restrictive lung disease    Type II diabetes mellitus ([place])     Assessment: 59 YOF presenting with CP and SOB, increased troponin, on Eliquis PTA for afib, last dose 2/5 @2100 .  Chronic anemia stable, plts 300s  Goal of Therapy:  Heparin level 0.3-0.7 units/ml aPTT 66-102 seconds Monitor platelets by anticoagulation protocol: Yes   Plan:  Heparin gtt at 1100 units/hr, no bolus F/u 6 hour aPTT/HL F/u cards eval and recs  Bertis Ruddy, PharmD Clinical Pharmacist ED Pharmacist Phone # [phone] 11/26/2021 9:41 AM

## 2021-11-26 NOTE — ED Triage Notes (Signed)
Pt arrived POV from home c/o chest pain, SHOB and feeling like her heart is racing since 3am. Pt states she has a hx of a-fib and thinks it is acting up.

## 2021-11-27 DIAGNOSIS — J449 Chronic obstructive pulmonary disease, unspecified: Secondary | ICD-10-CM | POA: Diagnosis not present

## 2021-11-27 DIAGNOSIS — E119 Type 2 diabetes mellitus without complications: Secondary | ICD-10-CM | POA: Diagnosis not present

## 2021-11-27 DIAGNOSIS — I25118 Atherosclerotic heart disease of native coronary artery with other forms of angina pectoris: Secondary | ICD-10-CM | POA: Diagnosis not present

## 2021-11-27 DIAGNOSIS — I509 Heart failure, unspecified: Secondary | ICD-10-CM | POA: Diagnosis not present

## 2021-11-27 DIAGNOSIS — I480 Paroxysmal atrial fibrillation: Secondary | ICD-10-CM | POA: Diagnosis not present

## 2021-11-27 DIAGNOSIS — Z20822 Contact with and (suspected) exposure to covid-19: Secondary | ICD-10-CM | POA: Diagnosis not present

## 2021-11-27 DIAGNOSIS — I2510 Atherosclerotic heart disease of native coronary artery without angina pectoris: Secondary | ICD-10-CM | POA: Diagnosis not present

## 2021-11-27 DIAGNOSIS — E782 Mixed hyperlipidemia: Secondary | ICD-10-CM | POA: Diagnosis not present

## 2021-11-27 DIAGNOSIS — E039 Hypothyroidism, unspecified: Secondary | ICD-10-CM | POA: Diagnosis not present

## 2021-11-27 DIAGNOSIS — I214 Non-ST elevation (NSTEMI) myocardial infarction: Secondary | ICD-10-CM | POA: Diagnosis not present

## 2021-11-27 DIAGNOSIS — I110 Hypertensive heart disease with heart failure: Secondary | ICD-10-CM | POA: Diagnosis not present

## 2021-11-27 LAB — CBC
HCT: 32.8 % — ABNORMAL LOW (ref 36.0–46.0)
Hemoglobin: 10.9 g/dL — ABNORMAL LOW (ref 12.0–15.0)
MCH: 33.1 pg (ref 26.0–34.0)
MCHC: 33.2 g/dL (ref 30.0–36.0)
MCV: 99.7 fL (ref 80.0–100.0)
Platelets: 310 10*3/uL (ref 150–400)
RBC: 3.29 MIL/uL — ABNORMAL LOW (ref 3.87–5.11)
RDW: 12.3 % (ref 11.5–15.5)
WBC: 5.4 10*3/uL (ref 4.0–10.5)
nRBC: 0 % (ref 0.0–0.2)

## 2021-11-27 LAB — LIPID PANEL
Cholesterol: 127 mg/dL (ref 0–200)
HDL: 44 mg/dL (ref >40)
LDL Cholesterol: 69 mg/dL (ref 0–99)
Total CHOL/HDL Ratio: 2.9 RATIO
Triglycerides: 72 mg/dL (ref <150)
VLDL: 14 mg/dL (ref 0–40)

## 2021-11-27 LAB — BASIC METABOLIC PANEL
Anion gap: 9 (ref 5–15)
BUN: 18 mg/dL (ref 8–23)
CO2: 25 mmol/L (ref 22–32)
Calcium: 8.8 mg/dL — ABNORMAL LOW (ref 8.9–10.3)
Chloride: 107 mmol/L (ref 98–111)
Creatinine, Ser: 1 mg/dL (ref 0.44–1.00)
GFR, Estimated: >60 mL/min (ref >60)
Glucose, Bld: 178 mg/dL — ABNORMAL HIGH (ref 70–99)
Potassium: 4.3 mmol/L (ref 3.5–5.1)
Sodium: 141 mmol/L (ref 135–145)

## 2021-11-27 LAB — HEMOGLOBIN A1C
Hgb A1c MFr Bld: 6.1 % — ABNORMAL HIGH (ref 4.8–5.6)
Mean Plasma Glucose: 128.37 mg/dL

## 2021-11-27 LAB — TSH: TSH: 1.915 u[IU]/mL (ref 0.350–4.500)

## 2021-11-27 LAB — HEPARIN LEVEL (UNFRACTIONATED)
Heparin Unfractionated: 0.88 IU/mL — ABNORMAL HIGH (ref 0.30–0.70)
Heparin Unfractionated: 1.01 IU/mL — ABNORMAL HIGH (ref 0.30–0.70)

## 2021-11-27 LAB — GLUCOSE, CAPILLARY
Glucose-Capillary: 143 mg/dL — ABNORMAL HIGH (ref 70–99)
Glucose-Capillary: 168 mg/dL — ABNORMAL HIGH (ref 70–99)

## 2021-11-27 LAB — APTT
aPTT: 110 seconds — ABNORMAL HIGH (ref 24–36)
aPTT: 152 seconds — ABNORMAL HIGH (ref 24–36)

## 2021-11-27 SURGERY — LEFT HEART CATH AND CORONARY ANGIOGRAPHY
Anesthesia: LOCAL

## 2021-11-27 MED ORDER — APIXABAN 5 MG PO TABS
5.0000 mg | ORAL_TABLET | Freq: Two times a day (BID) | ORAL | Status: DC
  Administered 2021-11-27: 5 mg via ORAL
  Filled 2021-11-27: qty 1

## 2021-11-27 NOTE — Discharge Summary (Addendum)
Discharge Summary    Patient ID: [name] MRN: [id number]; DOB: Jan 16, 1956  Admit date: 11/26/2021 Discharge date: 11/27/2021  PCP:  Glendale Chard, MD   [facility] Providers Cardiologist:  Sinclair Grooms, MD  Electrophysiologist:  Constance Haw, MD  {  Discharge Diagnoses    Principal Problem:   Chest pain Active Problems:   Hyperlipidemia   Paroxysmal atrial fibrillation [facility])  Diagnostic Studies/Procedures    N/a  _____________   History of Present Illness     [name] is a 66 y.o. female with  CAD status post DES to circumflex/LAD '16, chronic pericardial effusion, chronic pericarditis with pericardial rub, paroxysmal atrial fibrillation (on Eliquis and dofetilide), remote history of non-small cell lung cancer, pulmonary fibrosis, COPD, OSA and anemia who is being seen 11/26/2021 for the evaluation of chest pain/elevated troponin.  [name] is a 65 year old female who is followed by Dr. Tamala Julian and Dr. Curt Bears as an outpatient.  Underwent cardiac catheterization in September 2016 with PCI/DES to mid circumflex and mid LAD.  Repeat cardiac catheterization February 2018 showed patent stents.   Underwent cardiac MRI 03/2017 which showed no evidence of myocarditis.   History of recurrent chest pain and musculoskeletal complaints raised the question of autoimmune disease.  Underwent work-up in September 2022 which was unremarkable with negative ANA, double-stranded DNA antibody and negative rheumatoid factor, ENA antibodies and extractible nuclear antibodies.    Underwent cardiac catheterization 01/2021 with proximal LAD focal stenosis of 20 to 30% just prior to long stent which was widely patent.  Does have jailed diagonal branch of 60%.  LAD was noted to be wraparound with perfusion to the distal third territory of PDA.  Large dominant circumflex vessel patent, along with patent stent in OM1.   Underwent video bronchoscopy with Dr. Elsworth Soho on 07/25/2021 in the  setting of hemoptysis which was felt to be secondary to friable mucosa due to her Eliquis use.   She was last seen in the office on 08/2021 with Dr. Tamala Julian reported still having difficulty with chest pain at rest and exertion. There was a positional component as well as exertional shortness of breath.  It was felt that sleep apnea may be contributing, but she had been having issues with hemoptysis and stated that pulmonology asked that she not complete sleep study at that time.  At this visit it was felt that the etiology of her chest pain was uncertain but could be related to microvascular disease.  It was recommended that she enroll in cardiac rehab and continue with sublingual nitroglycerin use.   She presented to the ED 2/6 with complaints of chest pain.  Stated that morning she was watching TV around 3 AM.  She felt a sudden onset of palpitations and checked her heart rate on her Apple Watch and noted rates in the 160s.  She attempted to get up and go to the bathroom and felt lightheaded.  Had chest discomfort as well.  Given her symptoms were lingering she decided to come to the ED for further evaluation.  Reported that as she was walking into the ED into the triage area her heart rate started to settle down.  Chest discomfort improved.   In the ED her labs showed sodium 141, potassium 4, creatinine 1.02, magnesium 1.9, high-sensitivity troponin 8>> 2537, WBC 5.4, hemoglobin 11.1.  Chest x-ray showed no acute findings.  EKG showed sinus rhythm, 93 bpm, nonspecific changes, prolonged QTc.   Hospital Course  Chest pain: presented to the ED with sudden onset of chest pain the morning of admission around 3am, though this was in the setting of palpitations as she reported a HR in the 160-170s on her apple watch. Initial hsTn 8>>2537, but then recycled at 10>>11. Initially planned for cardiac cath but canceled with repeat neg trops. No further episodes of chest pain. Likely erroneous reading. EKG without  acute ST/T wave changes.  -- continue ASA, statin, BB and Diltiazem    CAD s/p DES to Lcx/LAD: '16, did have cath 01/2021 with patent stents -- continue ASA, statin, BB   Paroxsymal Afib: follows with Dr. Curt Bears, notes indicate Afib burden has been low in the past. Followed on telemetry without recurrent afib. -- on Dofetilide, Diltiazem 368m daily, Toprol 732m -- if she is having break  through afib may need EP input regarding management -- assisted her with setting up Afib notifications on her apple watch/iphone prior to dc   Chronic pericardial effusion: last echo 06/2021 with small pericardial effusion   Recurrent pericarditis: prior autoimmune work up in the past has been negative   HLD: LDL 69 -- on pravastatin    HTN: stable blood pressures -- continue Toprol, Diltiazem, spiro   DM: resume home regimen -- Hgb A1c 6.1   Hypothyroidism: continue synthroid  General: Well developed, well nourished, female appearing in no acute distress. Head: Normocephalic, atraumatic.  Neck: Supple without bruits, JVD. Lungs:  Resp regular and unlabored, CTA. Heart: RRR, S1, S2, no S3, S4, or murmur; no rub. Abdomen: Soft, non-tender, non-distended with normoactive bowel sounds. No hepatomegaly. No rebound/guarding. No obvious abdominal masses. Extremities: No clubbing, cyanosis, edema. Distal pedal pulses are 2+ bilaterally.  Neuro: Alert and oriented X 3. Moves all extremities spontaneously. Psych: Normal affect.  Patient was seen by Dr. VaIrish Lacknd deemed stable for discharge home. Follow up in the office has been arranged.  _____________  Discharge Vitals Blood pressure (!) 121/51, pulse 74, temperature 98.3 F (36.8 C), temperature source Oral, resp. rate 20, height 5' 7.5" (1.715 m), weight 106.9 kg, SpO2 98 %.  Filed Weights   11/26/21 0517 11/26/21 2106  Weight: 105.2 kg 106.9 kg    Labs & Radiologic Studies    CBC Recent Labs    11/26/21 0704 11/27/21 0408  WBC 5.4  5.4  HGB 11.1* 10.9*  HCT 35.1* 32.8*  MCV 101.2* 99.7  PLT 340 31578 Basic Metabolic Panel Recent Labs    11/26/21 0524 11/26/21 0800 11/27/21 0408  NA 141  --  141  K 4.0  --  4.3  CL 108  --  107  CO2 27  --  25  GLUCOSE 123*  --  178*  BUN 17  --  18  CREATININE 1.02*  --  1.00  CALCIUM 9.1  --  8.8*  MG  --  1.9  --    Liver Function Tests No results for input(s): AST, ALT, ALKPHOS, BILITOT, PROT, ALBUMIN in the last 72 hours. No results for input(s): LIPASE, AMYLASE in the last 72 hours. High Sensitivity Troponin:   Recent Labs  Lab 11/26/21 0524 11/26/21 0800 11/26/21 1125 11/26/21 1311  TROPONINIHS 8 2,537* 10 11    BNP Invalid input(s): POCBNP D-Dimer No results for input(s): DDIMER in the last 72 hours. Hemoglobin A1C Recent Labs    11/27/21 0010  HGBA1C 6.1*   Fasting Lipid Panel Recent Labs    11/27/21 0408  CHOL 127  HDL 44  LDLCALC 69  TRIG 72  CHOLHDL 2.9   Thyroid Function Tests Recent Labs    11/27/21 0010  TSH 1.915   _____________  DG Chest 2 View  Result Date: 11/26/2021 CLINICAL DATA:  66 year old female with history of chest pain and shortness of breath. EXAM: CHEST - 2 VIEW COMPARISON:  Chest x-ray 06/14/2021. FINDINGS: Chronic elevation of the left hemidiaphragm. Chronic pleuroparenchymal thickening and architectural distortion in the apex of the left hemithorax where there is also a suture line and several surgical clips. Widespread but patchy areas of interstitial prominence scattered throughout the lungs bilaterally, along with rather diffuse peribronchial cuffing, chronic and similar to prior study. No acute consolidative airspace disease. No pleural effusions. No pneumothorax. No evidence of pulmonary edema. Heart size is upper limits of normal and distorted by elevated left hemidiaphragm, similar to prior studies. IMPRESSION: 1. Chronic changes in the chest, stable compared to the prior examination, without radiographic  evidence of acute cardiopulmonary disease, as detailed above. Electronically Signed   By: Vinnie Langton M.D.   On: 11/26/2021 05:45   ECHOCARDIOGRAM COMPLETE  Result Date: 11/26/2021    ECHOCARDIOGRAM REPORT   Patient Name:   JULIANY DAUGHETY Date of Exam: 11/26/2021 Medical Rec #:  [id number]     Height:       67.5 in Accession #:    [id number]    Weight:       232.0 lb Date of Birth:  1956/08/30     BSA:          2.166 m Patient Age:    47 years      BP:           128/50 mmHg Patient Gender: F             HR:           64 bpm. Exam Location:  Inpatient Procedure: 2D Echo Indications:    chest pain  History:        Patient has prior history of Echocardiogram examinations, most                 recent 07/09/2021. CAD, Arrythmias:Atrial Fibrillation; Risk                 Factors:Diabetes.  Sonographer:    Johny Chess RDCS Referring Phys: [place] Comments: Image acquisition challenging due to patient body habitus. IMPRESSIONS  1. Left ventricular ejection fraction, by estimation, is 60 to 65%. The left ventricle has normal function. The left ventricle has no regional wall motion abnormalities. Left ventricular diastolic parameters are consistent with Grade I diastolic dysfunction (impaired relaxation).  2. Right ventricular systolic function is normal. The right ventricular size is normal. There is normal pulmonary artery systolic pressure.  3. The mitral valve is normal in structure. No evidence of mitral valve regurgitation. No evidence of mitral stenosis.  4. The aortic valve is normal in structure. Aortic valve regurgitation is moderate. No aortic stenosis is present. Aortic regurgitation PHT measures 345 msec.  5. The inferior vena cava is normal in size with greater than 50% respiratory variability, suggesting right atrial pressure of 3 mmHg. FINDINGS  Left Ventricle: Left ventricular ejection fraction, by estimation, is 60 to 65%. The left ventricle has normal function. The left  ventricle has no regional wall motion abnormalities. The left ventricular internal cavity size was normal in size. There is  no left ventricular hypertrophy. Left ventricular diastolic parameters are consistent with Grade I  diastolic dysfunction (impaired relaxation). Right Ventricle: The right ventricular size is normal. No increase in right ventricular wall thickness. Right ventricular systolic function is normal. There is normal pulmonary artery systolic pressure. The tricuspid regurgitant velocity is 2.51 m/s, and  with an assumed right atrial pressure of 3 mmHg, the estimated right ventricular systolic pressure is 01.7 mmHg. Left Atrium: Left atrial size was normal in size. Right Atrium: Right atrial size was normal in size. Pericardium: There is no evidence of pericardial effusion. Mitral Valve: The mitral valve is normal in structure. No evidence of mitral valve regurgitation. No evidence of mitral valve stenosis. Tricuspid Valve: The tricuspid valve is normal in structure. Tricuspid valve regurgitation is not demonstrated. No evidence of tricuspid stenosis. Aortic Valve: The aortic valve is normal in structure. Aortic valve regurgitation is moderate. Aortic regurgitation PHT measures 345 msec. No aortic stenosis is present. Pulmonic Valve: The pulmonic valve was normal in structure. Pulmonic valve regurgitation is mild. No evidence of pulmonic stenosis. Aorta: The aortic root is normal in size and structure. Venous: The inferior vena cava is normal in size with greater than 50% respiratory variability, suggesting right atrial pressure of 3 mmHg. IAS/Shunts: No atrial level shunt detected by color flow Doppler.  LEFT VENTRICLE PLAX 2D LVIDd:         5.00 cm   Diastology LVIDs:         3.00 cm   LV e' medial:    6.74 cm/s LV PW:         1.00 cm   LV E/e' medial:  9.9 LV IVS:        0.80 cm   LV e' lateral:   7.51 cm/s LVOT diam:     2.10 cm   LV E/e' lateral: 8.9 LV SV:         92 LV SV Index:   43 LVOT Area:      3.46 cm  RIGHT VENTRICLE             IVC RV S prime:     11.50 cm/s  IVC diam: 1.70 cm TAPSE (M-mode): 1.8 cm LEFT ATRIUM             Index        RIGHT ATRIUM           Index LA diam:        3.00 cm 1.39 cm/m   RA Area:     14.50 cm LA Vol (A2C):   46.5 ml 21.47 ml/m  RA Volume:   36.40 ml  16.81 ml/m LA Vol (A4C):   42.8 ml 19.76 ml/m LA Biplane Vol: 45.7 ml 21.10 ml/m  AORTIC VALVE LVOT Vmax:   130.00 cm/s LVOT Vmean:  82.600 cm/s LVOT VTI:    0.266 m AI PHT:      345 msec  AORTA Ao Root diam: 3.10 cm Ao Asc diam:  3.40 cm MITRAL VALVE               TRICUSPID VALVE MV Area (PHT): 3.48 cm    TR Peak grad:   25.2 mmHg MV Decel Time: 218 msec    TR Vmax:        251.00 cm/s MV E velocity: 66.70 cm/s MV A velocity: 78.50 cm/s  SHUNTS MV E/A ratio:  0.85        Systemic VTI:  0.27 m  Systemic Diam: 2.10 cm Candee Furbish MD Electronically signed by Candee Furbish MD Signature Date/Time: 11/26/2021/4:53:34 PM    Final    Disposition   Pt is being discharged home today in good condition.  Follow-up Plans & Appointments     Follow-up Information     Constance Haw, MD Follow up on 12/24/2021.   Specialty: Cardiology Why: at 10:45am for your follow up appt Contact information: [place] [address] [phone]                Discharge Instructions     Diet - low sodium heart healthy   Complete by: As directed    Increase activity slowly   Complete by: As directed        Discharge Medications   Allergies as of 11/27/2021       Reactions   Hydrocodone Hives, Itching   Morphine And Related Other (See Comments)   GI PROBLEMS   Canagliflozin Diarrhea   Metformin Hcl Diarrhea   Rosuvastatin Other (See Comments)   Pt reports causes lower extremity muscle aches/cramping   Lisinopril Cough   Nsaids Other (See Comments)   GI PROBLEMS        Medication List     TAKE these medications    Accu-Chek Guide w/Device  Kit See admin instructions.   Accu-Chek Softclix Lancets lancets USE LANCETS 3 TIMES A DAY AS DIRECTED 30 DAYS   acetaminophen 650 MG CR tablet Commonly known as: TYLENOL Take 1,300 mg by mouth every 8 (eight) hours as needed for pain (headache).   azaTHIOprine 50 MG tablet Commonly known as: IMURAN Take 150 mg by mouth every morning.   B-D UF III MINI PEN NEEDLES 31G X 5 MM Misc Generic drug: Insulin Pen Needle 2 (two) times daily. as directed   co-enzyme Q-10 50 MG capsule TAKE 1 CAPSULE BY MOUTH EVERY DAY What changed:  how much to take when to take this   diltiazem 360 MG 24 hr capsule Commonly known as: CARDIZEM CD TAKE 1 CAPSULE BY MOUTH EVERY DAY What changed:  how much to take how to take this when to take this   diphenhydrAMINE 25 MG tablet Commonly known as: BENADRYL Take 50 mg by mouth at bedtime as needed for allergies.   diphenhydramine-acetaminophen 25-500 MG Tabs tablet Commonly known as: TYLENOL PM Take 2 tablets by mouth at bedtime as needed (sleep).   dofetilide 500 MCG capsule Commonly known as: TIKOSYN TAKE 1 CAPSULE (500 MCG TOTAL) BY MOUTH 2 (TWO) TIMES DAILY.   Eliquis 5 MG Tabs tablet Generic drug: apixaban TAKE 1 TABLET BY MOUTH TWICE A DAY What changed: how much to take   folic acid 1 MG tablet Commonly known as: FOLVITE Take 1 mg by mouth every morning.   furosemide 40 MG tablet Commonly known as: LASIX Take 1 tablet (40 mg total) by mouth daily as needed for fluid or edema.   Insulin Lispro Prot & Lispro (75-25) 100 UNIT/ML Kwikpen Commonly known as: HUMALOG 75/25 MIX Inject 35-40 Units into the skin See admin instructions. Inject 40 units subcutaneously every morning and 35 units at night   levothyroxine 50 MCG tablet Commonly known as: SYNTHROID Take 50 mcg by mouth daily before breakfast.   losartan 25 MG tablet Commonly known as: COZAAR TAKE 1 TABLET BY MOUTH EVERY DAY What changed: when to take this   metoprolol  succinate 50 MG 24 hr tablet Commonly known as: TOPROL-XL Take 1.5  tablets (75 mg total) by mouth at bedtime. Take with or immediately following a meal. What changed: how much to take   nitroGLYCERIN 0.4 MG SL tablet Commonly known as: NITROSTAT Place 1 tablet (0.4 mg total) under the tongue every 5 (five) minutes as needed for chest pain.   Ozempic (0.25 or 0.5 MG/DOSE) 2 MG/1.5ML Sopn Generic drug: Semaglutide(0.25 or 0.5MG/DOS) INJECT 0.5 MG INTO THE SKIN ONCE A WEEK.   pravastatin 20 MG tablet Commonly known as: PRAVACHOL TAKE 1 TABLET BY MOUTH EVERY DAY What changed: when to take this   spironolactone 25 MG tablet Commonly known as: ALDACTONE Take 1 tablet (25 mg total) by mouth daily. What changed: when to take this           Outstanding Labs/Studies   N/a   Duration of Discharge Encounter   Greater than 30 minutes including physician time.  Signed, Reino Bellis, NP 11/27/2021, 11:22 AM   I have examined the patient and reviewed assessment and plan and discussed with patient.  Agree with above as stated.    History of CAD with concern for microvascular disease.  On this admission, negative troponin.  There was one elevated value which I think was.  [place] because there were 2 normal values after this.  No significant chest pain at this time.  Rhythm has been stable.  Continue Tikosyn.  Try to minimize stress as this has been an aggravating factor to her for her atrial fibrillation.  She has an Visual merchandiser as well which we have now set to alert her if she has atrial fibrillation.  If she has further episodes.  May need to consider ablation.  Back on Eliquis for stroke prevention.  She will follow-up with Dr. Curt Bears.  She will also follow-up with Dr. Tamala Julian.  [name]

## 2021-11-27 NOTE — Progress Notes (Signed)
Pt safely discharged. AVS instructions provided using teach back method. Pt verbalized understanding. Pt wishes to eat lunch before discharge; lunch order already placed. Call bell placed within reach. Will continue to monitor and maintain safety.

## 2021-11-27 NOTE — Care Management (Signed)
°  Transition of Care [facility]) Screening Note   Patient Details  Name: [name] Date of Birth: 08/29/1956   Transition of Care [facility]) CM/SW Contact:    Bethena Roys, RN Phone Number: 11/27/2021, 10:16 AM    Transition of Care Department [facility]) has reviewed the patient and no TOC needs have been identified at this time. We will continue to monitor patient advancement through interdisciplinary progression rounds. If new patient transition needs arise, please place a TOC consult.

## 2021-11-27 NOTE — Progress Notes (Addendum)
ANTICOAGULATION CONSULT NOTE   Pharmacy Consult for heparin Indication: chest pain/ACS  Allergies  Allergen Reactions   Hydrocodone Hives and Itching   Morphine And Related Other (See Comments)    GI PROBLEMS   Canagliflozin Diarrhea   Metformin Hcl Diarrhea   Rosuvastatin Other (See Comments)    Pt reports causes lower extremity muscle aches/cramping   Lisinopril Cough   Nsaids Other (See Comments)    GI PROBLEMS    Patient Measurements: Height: 5' 7.5" (171.5 cm) Weight: 106.9 kg (235 lb 9.6 oz) IBW/kg (Calculated) : 62.75 Heparin Dosing Weight: 86.5kg  Vital Signs: Temp: 98.4 F (36.9 C) (02/06 2348) Temp Source: Oral (02/06 2348) BP: 100/51 (02/06 2348) Pulse Rate: 75 (02/06 2348)  Labs: Recent Labs    11/26/21 0524 11/26/21 0704 11/26/21 0800 11/26/21 1125 11/26/21 1311 11/26/21 1457 11/27/21 0010  HGB  --  11.1*  --   --   --   --   --   HCT  --  35.1*  --   --   --   --   --   PLT  --  340  --   --   --   --   --   APTT  --   --   --   --   --  59* 110*  HEPARINUNFRC  --   --   --   --   --  0.99* 0.88*  CREATININE 1.02*  --   --   --   --   --   --   TROPONINIHS 8  --  2,537* 10 11  --   --      Estimated Creatinine Clearance: 71.7 mL/min (A) (by C-G formula based on SCr of 1.02 mg/dL (H)).  Assessment: 44 YOF presenting with CP and SOB, increased troponin, on Eliquis PTA for afib, last dose 2/5 @2100 .  Chronic anemia stable, plts 300s  aPTT 110 seconds  No issues noted  Goal of Therapy:  Heparin level 0.3-0.7 units/ml aPTT 66-102 seconds Monitor platelets by anticoagulation protocol: Yes   Plan:  Decrease heparin to 1200 units / hr Follow after cath 2/7  Thank you Anette Guarneri, PharmD   0530 AM:  PTT trending up despite decreasing heparin,  will decrease further to 1100 units/ hr  Follow up after cath today  Thank you Anette Guarneri, PharmD

## 2021-11-28 ENCOUNTER — Encounter: Payer: Medicare PPO | Admitting: Internal Medicine

## 2021-11-28 ENCOUNTER — Telehealth: Payer: Self-pay

## 2021-11-28 NOTE — Telephone Encounter (Signed)
Called pt wanting to complete TCM, after visit to the ED. Pt answered, stating she will give the office a call back to complete TCM.

## 2021-11-30 ENCOUNTER — Ambulatory Visit (HOSPITAL_COMMUNITY): Payer: Medicare PPO

## 2021-12-03 ENCOUNTER — Ambulatory Visit (HOSPITAL_COMMUNITY): Payer: Medicare PPO

## 2021-12-05 ENCOUNTER — Ambulatory Visit (HOSPITAL_COMMUNITY): Payer: Medicare PPO

## 2021-12-07 ENCOUNTER — Ambulatory Visit (HOSPITAL_COMMUNITY): Payer: Medicare PPO

## 2021-12-20 ENCOUNTER — Ambulatory Visit: Payer: Medicare PPO | Admitting: Adult Health

## 2021-12-24 ENCOUNTER — Other Ambulatory Visit: Payer: Self-pay

## 2021-12-24 ENCOUNTER — Ambulatory Visit (INDEPENDENT_AMBULATORY_CARE_PROVIDER_SITE_OTHER): Payer: Medicare HMO | Admitting: Cardiology

## 2021-12-24 ENCOUNTER — Encounter: Payer: Self-pay | Admitting: Cardiology

## 2021-12-24 ENCOUNTER — Encounter: Payer: Self-pay | Admitting: *Deleted

## 2021-12-24 VITALS — BP 120/60 | HR 77 | Ht 67.5 in | Wt 241.6 lb

## 2021-12-24 DIAGNOSIS — D6869 Other thrombophilia: Secondary | ICD-10-CM | POA: Diagnosis not present

## 2021-12-24 DIAGNOSIS — I480 Paroxysmal atrial fibrillation: Secondary | ICD-10-CM

## 2021-12-24 DIAGNOSIS — Z01812 Encounter for preprocedural laboratory examination: Secondary | ICD-10-CM

## 2021-12-24 NOTE — Patient Instructions (Addendum)
Medication Instructions:  ?Your physician recommends that you continue on your current medications as directed. Please refer to the Current Medication list given to you today. ? ?*If you need a refill on your cardiac medications before your next appointment, please call your pharmacy* ? ? ?Lab Work: ?Pre procedure labs 02/11/2022 (see procedure instructions) :  BMP & CBC ? ?If you have labs (blood work) drawn today and your tests are completely normal, you will receive your results only by: ?MyChart Message (if you have MyChart) OR ?A paper copy in the mail ?If you have any lab test that is abnormal or we need to change your treatment, we will call you to review the results. ? ? ?Testing/Procedures: ?Your physician has requested that you have cardiac CT within 7 days PRIOR to your ablation. Cardiac computed tomography (CT) is a painless test that uses an x-ray machine to take clear, detailed pictures of your heart.  Please follow instruction letter given to you today ?You will get a call from our office to schedule the date for this test. ? ?Your physician has recommended that you have an ablation. Catheter ablation is a medical procedure used to treat some cardiac arrhythmias (irregular heartbeats). During catheter ablation, a long, thin, flexible tube is put into a blood vessel in your groin (upper thigh), or neck. This tube is called an ablation catheter. It is then guided to your heart through the blood vessel. Radio frequency waves destroy small areas of heart tissue where abnormal heartbeats may cause an arrhythmia to start. Please follow instruction letter given to you today ? ? ?Follow-Up: ?At [facility], you and your health needs are our priority.  As part of our continuing mission to provide you with exceptional heart care, we have created designated Provider Care Teams.  These Care Teams include your primary Cardiologist (physician) and Advanced Practice Providers (APPs -  Physician Assistants and  Nurse Practitioners) who all work together to provide you with the care you need, when you need it. ? ?Your next appointment:   ?1 month(s) after your ablation ? ?The format for your next appointment:   ?In Person ? ?Provider:   ?AFib clinic ? ? ?Thank you for choosing [facility]!! ? ? ?Trinidad Curet, RN ?([phone] ? ? ? ?Other Instructions ? ?Cardiac Ablation ?Cardiac ablation is a procedure to destroy (ablate) some heart tissue that is sending bad signals. These bad signals cause problems in heart rhythm. ?The heart has many areas that make these signals. If there are problems in these areas, they can make the heart beat in a way that is not normal. Destroying some tissues can help make the heart rhythm normal. ?Tell your doctor about: ?Any allergies you have. ?All medicines you are taking. These include vitamins, herbs, eye drops, creams, and over-the-counter medicines. ?Any problems you or family members have had with medicines that make you fall asleep (anesthetics). ?Any blood disorders you have. ?Any surgeries you have had. ?Any medical conditions you have, such as kidney failure. ?Whether you are pregnant or may be pregnant. ?What are the risks? ?This is a safe procedure. But problems may occur, including: ?Infection. ?Bruising and bleeding. ?Bleeding into the chest. ?Stroke or blood clots. ?Damage to nearby areas of your body. ?Allergies to medicines or dyes. ?The need for a pacemaker if the normal system is damaged. ?Failure of the procedure to treat the problem. ?What happens before the procedure? ?Medicines ?Ask your doctor about: ?Changing or stopping your normal medicines. This is  important. ?Taking aspirin and ibuprofen. Do not take these medicines unless your doctor tells you to take them. ?Taking other medicines, vitamins, herbs, and supplements. ?General instructions ?Follow instructions from your doctor about what you cannot eat or drink. ?Plan to have someone take you home from the  hospital or clinic. ?If you will be going home right after the procedure, plan to have someone with you for 24 hours. ?Ask your doctor what steps will be taken to prevent infection. ?What happens during the procedure? ? ?An IV tube will be put into one of your veins. ?You will be given a medicine to help you relax. ?The skin on your neck or groin will be numbed. ?A cut (incision) will be made in your neck or groin. A needle will be put through your cut and into a large vein. ?A tube (catheter) will be put into the needle. The tube will be moved to your heart. ?Dye may be put through the tube. This helps your doctor see your heart. ?Small devices (electrodes) on the tube will send out signals. ?A type of energy will be used to destroy some heart tissue. ?The tube will be taken out. ?Pressure will be held on your cut. This helps stop bleeding. ?A bandage will be put over your cut. ?The exact procedure may vary among doctors and hospitals. ?What happens after the procedure? ?You will be watched until you leave the hospital or clinic. This includes checking your heart rate, breathing rate, oxygen, and blood pressure. ?Your cut will be watched for bleeding. You will need to lie still for a few hours. ?Do not drive for 24 hours or as long as your doctor tells you. ?Summary ?Cardiac ablation is a procedure to destroy some heart tissue. This is done to treat heart rhythm problems. ?Tell your doctor about any medical conditions you may have. Tell him or her about all medicines you are taking to treat them. ?This is a safe procedure. But problems may occur. These include infection, bruising, bleeding, and damage to nearby areas of your body. ?Follow what your doctor tells you about food and drink. You may also be told to change or stop some of your medicines. ?After the procedure, do not drive for 24 hours or as long as your doctor tells you. ?This information is not intended to replace advice given to you by your health care  provider. Make sure you discuss any questions you have with your health care provider. ?Document Revised: 09/09/2019 Document Reviewed: 09/09/2019 ?Elsevier Patient Education ? 2022 [place]. ? ? ?

## 2021-12-24 NOTE — Progress Notes (Signed)
Electrophysiology Office Note   Date:  12/24/2021   ID:  [name], DOB 04/03/1956, MRN [id number]  PCP:  Glendale Chard, MD  Cardiologist:  Tamala Julian Primary Electrophysiologist:  Urie Loughner Meredith Leeds, MD    Chief Complaint: AF   History of Present Illness: [name] is a 66 y.o. female who is being seen today for the evaluation of AF at the request of Glendale Chard, MD. Presenting today for electrophysiology evaluation.  He has a history significant for coronary artery disease status post circumflex and LAD stents, chronic pericardial effusion with chronic pericarditis and friction rub, CHF, diabetes, hypertension, non-small cell lung cancer status post surgery and chemotherapy, COPD, pulmonary fibrosis, atrial fibrillation.  She was admitted to the hospital April 2020 and was loaded on dofetilide.  She was recently in the hospital for chest pain and was ruled out for coronary issues.  She was also having episodes of palpitations that she felt was related to stress.  She was monitored on telemetry without evidence of atrial fibrillation.  Today, denies symptoms of palpitations, chest pain, shortness of breath, orthopnea, PND, lower extremity edema, claudication, dizziness, presyncope, syncope, bleeding, or neurologic sequela. The patient is tolerating medications without difficulties.  She unfortunately feels that she is having more frequent episodes of atrial fibrillation.  She gets short of breath with exertion.  She does have an Apple watch and has intermittent episodes documented.  She also has palpitations associated with her atrial fibrillation.   Past Medical History:  Diagnosis Date   Allergy    Anemia    Anemia    Aortic insufficiency    a. Mod by echo 05/2015.   Arthritis    "knees" (07/11/2015)   Atrial fibrillation (HCC)    On Eliquis   Carcinoma of hilus of lung ([place]) 01/20/2012   IIIB NSCL  Right hilar mass compressing esophagus/presenting with dysphagia Rx  Surgery/RT/chemo dx January 2001   Coronary artery disease    a. Abnormal stress test -> LHc 06/2015 s/p DES to mCX and mLAD, residual D2 disease treated medially.   Diverticulitis    Elevated blood pressure    Erythema nodosum    Heart murmur    Insomnia    lung ca dx'd 10/1999   chemo/xrt comp 05/29/2000   Myalgia    Nephrolithiasis 2015   Obesity    Pericardial effusion    a. 05/2015 Echo: EF 55-60%, no rwma, Gr 1 DD, no effusion - but an effusion was seen on CT which was felt to be increased in size compared to 12/2014 - pericardium also thickened.    Pericarditis    a. Dx 05/2015.   Pneumothorax, spontaneous, tension 01/20/2012   October, 2010   Pulmonary fibrosis ([place]) 01/20/2012   Due to previous surgery and chest radiation for lung cancer   Restrictive lung disease    Type II diabetes mellitus [facility])    Past Surgical History:  Procedure Laterality Date   ABDOMINAL HYSTERECTOMY  10/21/2006   APPENDECTOMY  1969?   BRONCHIAL WASHINGS  08/03/2021   Procedure: BRONCHIAL WASHINGS;  Surgeon: Rigoberto Noel, MD;  Location: [place];  Service: Cardiopulmonary;;   CARDIAC CATHETERIZATION N/A 07/11/2015   Procedure: Left Heart Cath and Coronary Angiography;  Surgeon: Jettie Booze, MD;  Location: [place] [facility];  Service: Cardiovascular;  Laterality: N/A;   CARDIAC CATHETERIZATION  07/11/2015   Procedure: Coronary Stent Intervention;  Surgeon: Jettie Booze, MD;  Location: [place] [facility];  Service:  Cardiovascular;;   CARPAL TUNNEL RELEASE Bilateral 1998-2005?   right-left   CARPAL TUNNEL RELEASE Left 2009?   "for the 2nd time"   COLONOSCOPY     COLONOSCOPY WITH PROPOFOL N/A 05/28/2018   Procedure: COLONOSCOPY WITH PROPOFOL;  Surgeon: Milus Banister, MD;  Location: [facility];  Service: Endoscopy;  Laterality: N/A;   CORONARY ANGIOPLASTY     CYSTOSCOPY W/ STONE MANIPULATION  10/22/2011   ESOPHAGOGASTRODUODENOSCOPY (EGD) WITH ESOPHAGEAL DILATION  10/21/2010    FOOT SURGERY Bilateral 06/22/1979   Callous removed    INNER EAR SURGERY Right ~ 2009   KNEE ARTHROPLASTY Right 10/21/1989   KNEE ARTHROSCOPY  ~ 2000   LATERAL RELEASE   LEFT HEART CATH AND CORONARY ANGIOGRAPHY N/A 12/18/2016   Procedure: Left Heart Cath and Coronary Angiography;  Surgeon: Burnell Blanks, MD;  Location: [place] [facility];  Service: Cardiovascular;  Laterality: N/A;   LEFT HEART CATH AND CORONARY ANGIOGRAPHY N/A 02/01/2021   Procedure: LEFT HEART CATH AND CORONARY ANGIOGRAPHY;  Surgeon: Leonie Man, MD;  Location: [place] [facility];  Service: Cardiovascular;  Laterality: N/A;   LUNG CANCER SURGERY Left 10/22/1999   "small cell"   LUNG SURGERY Right 10/21/2005   "reinflated it"   PULMONARY EMBOLISM SURGERY  10/21/2005   LUNG COLLAPSE   RIGHT AND LEFT HEART CATH N/A 06/25/2017   Procedure: RIGHT AND LEFT HEART CATH;  Surgeon: Sherren Mocha, MD;  Location: [place] [facility];  Service: Cardiovascular;  Laterality: N/A;   SHOULDER ADHESION RELEASE Right ~ 2004   SHOULDER ARTHROSCOPY W/ ROTATOR CUFF REPAIR Right ~ 2003   UPPER GASTROINTESTINAL ENDOSCOPY  06/05/2021   VIDEO BRONCHOSCOPY N/A 08/03/2021   Procedure: VIDEO BRONCHOSCOPY WITHOUT FLUORO;  Surgeon: Rigoberto Noel, MD;  Location: [place];  Service: Cardiopulmonary;  Laterality: N/A;     Current Outpatient Medications  Medication Sig Dispense Refill   Accu-Chek Softclix Lancets lancets USE LANCETS 3 TIMES A DAY AS DIRECTED 30 DAYS     acetaminophen (TYLENOL) 650 MG CR tablet Take 1,300 mg by mouth every 8 (eight) hours as needed for pain (headache).     azaTHIOprine (IMURAN) 50 MG tablet Take 150 mg by mouth every morning.     B-D UF III MINI PEN NEEDLES 31G X 5 MM MISC 2 (two) times daily. as directed     Blood Glucose Monitoring Suppl (ACCU-CHEK GUIDE) w/Device KIT See admin instructions.     co-enzyme Q-10 50 MG capsule TAKE 1 CAPSULE BY MOUTH EVERY DAY (Patient taking differently: Take  50 mg by mouth every morning.) 30 capsule 7   diltiazem (CARDIZEM CD) 360 MG 24 hr capsule TAKE 1 CAPSULE BY MOUTH EVERY DAY (Patient taking differently: 360 mg every morning.) 90 capsule 3   diphenhydrAMINE (BENADRYL) 25 MG tablet Take 50 mg by mouth at bedtime as needed for allergies.     diphenhydramine-acetaminophen (TYLENOL PM) 25-500 MG TABS tablet Take 2 tablets by mouth at bedtime as needed (sleep).     dofetilide (TIKOSYN) 500 MCG capsule TAKE 1 CAPSULE (500 MCG TOTAL) BY MOUTH 2 (TWO) TIMES DAILY. 180 capsule 1   ELIQUIS 5 MG TABS tablet TAKE 1 TABLET BY MOUTH TWICE A DAY (Patient taking differently: Take 5 mg by mouth 2 (two) times daily.) 676 tablet 1   folic acid (FOLVITE) 1 MG tablet Take 1 mg by mouth every morning.     furosemide (LASIX) 40 MG tablet Take 1 tablet (40 mg total) by mouth daily  as needed for fluid or edema. 30 tablet 6   Insulin Lispro Prot & Lispro (HUMALOG 75/25 MIX) (75-25) 100 UNIT/ML Kwikpen Inject 35-40 Units into the skin See admin instructions. Inject 40 units subcutaneously every morning and 35 units at night     levothyroxine (SYNTHROID, LEVOTHROID) 50 MCG tablet Take 50 mcg by mouth daily before breakfast.   6   losartan (COZAAR) 25 MG tablet TAKE 1 TABLET BY MOUTH EVERY DAY (Patient taking differently: Take 25 mg by mouth every morning.) 90 tablet 3   metoprolol succinate (TOPROL-XL) 50 MG 24 hr tablet Take 1.5 tablets (75 mg total) by mouth at bedtime. Take with or immediately following a meal. (Patient taking differently: Take 50 mg by mouth at bedtime. Take with or immediately following a meal.) 135 tablet 3   OZEMPIC, 0.25 OR 0.5 MG/DOSE, 2 MG/1.5ML SOPN INJECT 0.5 MG INTO THE SKIN ONCE A WEEK. 3 pen 3   pravastatin (PRAVACHOL) 20 MG tablet TAKE 1 TABLET BY MOUTH EVERY DAY (Patient taking differently: Take 20 mg by mouth at bedtime.) 90 tablet 3   spironolactone (ALDACTONE) 25 MG tablet Take 1 tablet (25 mg total) by mouth daily. (Patient taking  differently: Take 25 mg by mouth every morning.) 90 tablet 3   nitroGLYCERIN (NITROSTAT) 0.4 MG SL tablet Place 1 tablet (0.4 mg total) under the tongue every 5 (five) minutes as needed for chest pain. 25 tablet 3   No current facility-administered medications for this visit.    Allergies:   Hydrocodone, Morphine and related, Canagliflozin, Metformin hcl, Rosuvastatin, Lisinopril, and Nsaids   Social History:  The patient  reports that she quit smoking about 22 years ago. Her smoking use included cigarettes. She has a 1.25 pack-year smoking history. She has never used smokeless tobacco. She reports that she does not drink alcohol and does not use drugs.   Family History:  The patient's family history includes Diabetes in her father, maternal grandmother, mother, paternal grandmother, sister, sister, sister, and sister; Heart disease in her father; Hypertension in her sister; Kidney disease in her sister; Pancreatic cancer in her mother.   ROS:  Please see the history of present illness.   Otherwise, review of systems is positive for none.   All other systems are reviewed and negative.   PHYSICAL EXAM: VS:  BP 120/60    Pulse 77    Ht 5' 7.5" (1.715 m)    Wt 241 lb 9.6 oz (109.6 kg)    BMI 37.28 kg/m  , BMI Body mass index is 37.28 kg/m. GEN: Well nourished, well developed, in no acute distress  HEENT: normal  Neck: no JVD, carotid bruits, or masses Cardiac: RRR; no murmurs, rubs, or gallops,no edema  Respiratory:  clear to auscultation bilaterally, normal work of breathing GI: soft, nontender, nondistended, + BS MS: no deformity or atrophy  Skin: warm and dry Neuro:  Strength and sensation are intact Psych: euthymic mood, full affect  EKG:  EKG is not ordered today. Personal review of the ekg ordered 11/27/21 shows sinus rhythm, rate 65  Recent Labs: 04/25/2021: ALT 9 11/26/2021: Magnesium 1.9 11/27/2021: BUN 18; Creatinine, Ser 1.00; Hemoglobin 10.9; Platelets 310; Potassium 4.3; Sodium  141; TSH 1.915    Lipid Panel     Component Value Date/Time   CHOL 127 11/27/2021 0408   CHOL 130 04/25/2021 1614   TRIG 72 11/27/2021 0408   HDL 44 11/27/2021 0408   HDL 44 04/25/2021 1614   CHOLHDL 2.9 11/27/2021 0408  VLDL 14 11/27/2021 0408   LDLCALC 69 11/27/2021 0408   LDLCALC 67 04/25/2021 1614     Wt Readings from Last 3 Encounters:  12/24/21 241 lb 9.6 oz (109.6 kg)  11/26/21 235 lb 9.6 oz (106.9 kg)  08/30/21 241 lb 9.6 oz (109.6 kg)      Other studies Reviewed: Additional studies/ records that were reviewed today include: TTE 02/17/19  Review of the above records today demonstrates:   1. The left ventricle has hyperdynamic systolic function, with an ejection fraction of >65%. The cavity size was normal. There is moderately increased left ventricular wall thickness. Left ventricular diastolic Doppler parameters are consistent with  pseudonormalization. Elevated left atrial and left ventricular end-diastolic pressures The E/e' is >15. No evidence of left ventricular regional wall motion abnormalities.  2. The right ventricle has normal systolic function. The cavity was normal. There is no increase in right ventricular wall thickness.  3. The mitral valve is grossly normal.  4. The tricuspid valve is grossly normal.  5. The aortic valve is tricuspid. Mild sclerosis of the aortic valve. Aortic valve regurgitation is mild to moderate by color flow Doppler. mildly increased gradient without stenosis of the aortic valve.  6. The aortic root and ascending aorta are normal in size and structure.  7. The inferior vena cava was dilated in size with <50% respiratory variability.   ASSESSMENT AND PLAN:  1.  Paroxysmal atrial fibrillation: Currently on dofetilide 500 mcg twice daily, Eliquis 5 mg twice daily.  High risk medication monitoring for dofetilide.  CHA2DS2-VASc of 4.  She brings in recordings showing atrial fibrillation.  She feels that she is having more frequent  episodes.  Due to that, we Pablo Mathurin plan for ablation.  Risk, benefits, and alternatives to EP study and radiofrequency ablation for afib were also discussed in detail today. These risks include but are not limited to stroke, bleeding, vascular damage, tamponade, perforation, damage to the esophagus, lungs, and other structures, pulmonary vein stenosis, worsening renal function, and death. The patient understands these risk and wishes to proceed.  We Mikhaela Zaugg therefore proceed with catheter ablation at the next available time.  Carto, ICE, anesthesia are requested for the procedure.  Reubin Bushnell also obtain CT PV protocol prior to the procedure to exclude LAA thrombus and further evaluate atrial anatomy.   2.  Recurrent pericarditis: Followed by general cardiology  3.  Chronic diastolic heart failure: No obvious volume overload.  4.  Coronary artery disease: No current chest pain.  Plan per primary cardiology.  5.  Secondary hypercoagulable state: Currently on Eliquis for atrial fibrillation as above.  Current medicines are reviewed at length with the patient today.   The patient does not have concerns regarding her medicines.  The following changes were made today: none  Labs/ tests ordered today include:  Orders Placed This Encounter  Procedures   CT CARDIAC MORPH/PULM VEIN W/CM&W/O CA SCORE   CBC   Basic metabolic panel     Disposition:   FU with Wynter Grave 3 months  Signed, Oren Barella Meredith Leeds, MD  12/24/2021 11:22 AM     [facility] [address] [place] [address] [phone] (office) [phone] (fax)

## 2022-01-01 DIAGNOSIS — M1712 Unilateral primary osteoarthritis, left knee: Secondary | ICD-10-CM | POA: Diagnosis not present

## 2022-01-01 DIAGNOSIS — M25562 Pain in left knee: Secondary | ICD-10-CM | POA: Diagnosis not present

## 2022-01-03 DIAGNOSIS — I2510 Atherosclerotic heart disease of native coronary artery without angina pectoris: Secondary | ICD-10-CM | POA: Diagnosis not present

## 2022-01-03 DIAGNOSIS — E1165 Type 2 diabetes mellitus with hyperglycemia: Secondary | ICD-10-CM | POA: Diagnosis not present

## 2022-01-03 DIAGNOSIS — I318 Other specified diseases of pericardium: Secondary | ICD-10-CM | POA: Diagnosis not present

## 2022-01-03 DIAGNOSIS — C3490 Malignant neoplasm of unspecified part of unspecified bronchus or lung: Secondary | ICD-10-CM | POA: Diagnosis not present

## 2022-01-03 DIAGNOSIS — I10 Essential (primary) hypertension: Secondary | ICD-10-CM | POA: Diagnosis not present

## 2022-01-03 DIAGNOSIS — E039 Hypothyroidism, unspecified: Secondary | ICD-10-CM | POA: Diagnosis not present

## 2022-01-03 DIAGNOSIS — Z794 Long term (current) use of insulin: Secondary | ICD-10-CM | POA: Diagnosis not present

## 2022-01-03 DIAGNOSIS — E782 Mixed hyperlipidemia: Secondary | ICD-10-CM | POA: Diagnosis not present

## 2022-01-03 DIAGNOSIS — I480 Paroxysmal atrial fibrillation: Secondary | ICD-10-CM | POA: Diagnosis not present

## 2022-01-04 ENCOUNTER — Other Ambulatory Visit: Payer: Self-pay | Admitting: Internal Medicine

## 2022-01-04 DIAGNOSIS — Z1231 Encounter for screening mammogram for malignant neoplasm of breast: Secondary | ICD-10-CM

## 2022-01-07 ENCOUNTER — Other Ambulatory Visit: Payer: Self-pay

## 2022-01-07 MED ORDER — DOFETILIDE 500 MCG PO CAPS: 500.0000 ug | ORAL_CAPSULE | Freq: Two times a day (BID) | ORAL | 3 refills | Status: DC

## 2022-01-14 ENCOUNTER — Other Ambulatory Visit: Payer: Self-pay

## 2022-01-14 ENCOUNTER — Ambulatory Visit: Payer: Medicare HMO | Admitting: Pulmonary Disease

## 2022-01-14 ENCOUNTER — Encounter: Payer: Self-pay | Admitting: Pulmonary Disease

## 2022-01-14 DIAGNOSIS — R042 Hemoptysis: Secondary | ICD-10-CM

## 2022-01-14 DIAGNOSIS — R0602 Shortness of breath: Secondary | ICD-10-CM | POA: Diagnosis not present

## 2022-01-14 NOTE — Assessment & Plan Note (Signed)
Chronic dyspnea attributed to deconditioning some restrictive lung disease and paroxysmal A-fib ?

## 2022-01-14 NOTE — Assessment & Plan Note (Signed)
Intermittent, trace hemoptysis.  No cause found on laryngoscopy or bronchoscopy.  Attributed to Eliquis with friable mucosa. ?Continue to monitor. ?Her CT scan has not shown any new findings, we will continue surveillance annually, repeat CT chest without contrast in September 2023 ?

## 2022-01-14 NOTE — Progress Notes (Signed)
? ?  Subjective:  ? ? Patient ID: [name], female    DOB: 1956-06-02, 66 y.o.   MRN: [id number] ? ?HPI ? ?66 yo never smoker for FU of dyspnea attributed to restrictive lung disease  ?  ?PMH -  LUlobectomy for spont pneumothx in 07/2006, s/p chemo/ XRT for stg III B lung cancer in 2001 - in remission since!   ?- chronic pericarditis since 2016, has received NSAIDs, colchicine and steroids, was [facility] to [facility]. Serology neg ?-Episcleritis on Imuran ?-CAD -cardiac cath 06/2015 required 2 stents, repeat 06/2017,  moderate aortic regurgitation ?-Paroxysmal atrial fibrillation ?- EGD 11/2017 which showed mild narrowing of the mid esophagus without mass ?-Hemoptysis since March 2022-ENT laryngoscopy negative, GI endoscopy negative, bronchoscopy negative ? ? ?89-month follow-up visit. ?She continues to complain of trace hemoptysis happens once a month or so when she is brushing her teeth and clears her throat hard, occasionally trace pink stained sputum.  Never frank blood. ?We reviewed results of bronchoscopy ? ?Interim cardiology consultation reviewed, she has paroxysmal atrial fibrillation, EP is considering ablation.  She was treated for recurrence of pericarditis in 2022 she is awaiting Dr. Tamala Julian opinion regarding ablation ?She had a hospital admission in February for A-fib her Apple watch occasionally picks up on the change in rhythm ? ? ?Significant tests/ events reviewed ? ? ?Bronchoscopy 07/2021 Friable mucosa  ?CT chest wo con 06/2021 >> treatment changes in the left apex with scarring and bronchiectasis similar to previous exam, residual changes of lung resection on the right ?CT chest with contrast 01/2020  >>esophageal air fluid level suggests dysmotility or gastroesophageal reflux ?  ?02/2020 HST AHI 6/h ?8/202022 NPSG AHI 8/hour ? ?Review of Systems ?neg for any significant sore throat, dysphagia, itching, sneezing, nasal congestion or excess/ purulent secretions, fever, chills, sweats, unintended wt loss,  pleuritic or exertional cp, hempoptysis, orthopnea pnd or change in chronic leg swelling. Also denies presyncope, palpitations, heartburn, abdominal pain, nausea, vomiting, diarrhea or change in bowel or urinary habits, dysuria,hematuria, rash, arthralgias, visual complaints, headache, numbness weakness or ataxia. ? ?   ?Objective:  ? Physical Exam ? ?Gen. Pleasant, obese, in no distress ?ENT - no lesions, no post nasal drip ?Neck: No JVD, no thyromegaly, no carotid bruits ?Lungs: no use of accessory muscles, no dullness to percussion, decreased without rales or rhonchi  ?Cardiovascular: Rhythm regular, heart sounds  normal, no murmurs or gallops, no peripheral edema ?Musculoskeletal: No deformities, no cyanosis or clubbing , no tremors ? ? ? ?   ?Assessment & Plan:  ? ? ?

## 2022-01-14 NOTE — Patient Instructions (Addendum)
X CT chest wo con in sep 2023 ?

## 2022-01-14 NOTE — Addendum Note (Signed)
Addended by: Gavin Potters R on: 01/14/2022 03:09 PM ? ? Modules accepted: Orders ? ?

## 2022-01-19 DIAGNOSIS — Z20822 Contact with and (suspected) exposure to covid-19: Secondary | ICD-10-CM | POA: Diagnosis not present

## 2022-01-23 ENCOUNTER — Encounter: Payer: Self-pay | Admitting: Gastroenterology

## 2022-01-29 DIAGNOSIS — M1712 Unilateral primary osteoarthritis, left knee: Secondary | ICD-10-CM | POA: Diagnosis not present

## 2022-01-29 DIAGNOSIS — M1812 Unilateral primary osteoarthritis of first carpometacarpal joint, left hand: Secondary | ICD-10-CM | POA: Diagnosis not present

## 2022-01-30 ENCOUNTER — Other Ambulatory Visit: Payer: Self-pay | Admitting: Cardiology

## 2022-01-30 NOTE — Telephone Encounter (Signed)
Pt last saw Dr Curt Bears 12/24/21, last labs 11/27/21 Creat 1.0, age 65, weight 105.8kg, based on specified criteria pt is on appropriate dosage of Eliquis 5mg  BID for afib.  Will refill rx.  ?

## 2022-02-03 NOTE — Progress Notes (Signed)
?Cardiology Office Note:   ? ?Date:  02/06/2022  ? ?ID:  LOMA [name], DOB 1956/03/30, MRN [id number] ? ?PCP:  Mckinley Jewel, MD  ?Cardiologist:  Sinclair Grooms, MD  ? ?Referring MD: Glendale Chard, MD  ? ?Chief Complaint  ?Patient presents with  ? Atrial Fibrillation  ? Chest Pain  ? Hypertension  ? ? ?History of Present Illness:   ? ?[name] is a 66 y.o. female with a hx of  CAD s/p DES to [facility] and mLAD 07/11/15 (patent stents on repeat cath 11/2016), chronic pericardial effusion, h/o chronic pericarditis with chronic pericardial rub treated intermittently with steroids and colchicine, iritis, PAF on Eliquis and dofetilide therapy, chronic diastolic CHF, DM, HTN, remote h/o treated non-small cell lung cancer, pulmonary fibrosis, aortic insufficiency, COPD and anemia. ? ?Scheduled for atrial fibrillation Ablation 02/27/2022, per Dr. Curt Bears. ? ?She is concerned about chest pain.  It is positional.  It is in the right parasternal and lower midsternal region.  It feels like a pressure.  It is positional.  It is aggravated by physical activity.  She is concerned that it may represent reocclusion of her coronaries.  Qualities are consistent with inflammatory or musculoskeletal.  She has a history of chronic pericardial disease.  Pericardial disease is likely secondary to radiation for lung cancer. ? ?She has moderate to severe dyspnea on exertion that has been longstanding since bilateral surgical resection and chemotherapy.  She also has pulmonary fibrosis. ? ?Past Medical History:  ?Diagnosis Date  ? Allergy   ? Anemia   ? Anemia   ? Aortic insufficiency   ? a. Mod by echo 05/2015.  ? Arthritis   ? "knees" (07/11/2015)  ? Atrial fibrillation ([place])   ? On Eliquis  ? Carcinoma of hilus of lung ([place]) 01/20/2012  ? IIIB NSCL  Right hilar mass compressing esophagus/presenting with dysphagia Rx Surgery/RT/chemo dx January 2001  ? Coronary artery disease   ? a. Abnormal stress test -> LHc 06/2015 s/p DES to mCX and  mLAD, residual D2 disease treated medially.  ? Diverticulitis   ? Elevated blood pressure   ? Erythema nodosum   ? Heart murmur   ? Insomnia   ? lung ca dx'd 10/1999  ? chemo/xrt comp 05/29/2000  ? Myalgia   ? Nephrolithiasis 2015  ? Obesity   ? Pericardial effusion   ? a. 05/2015 Echo: EF 55-60%, no rwma, Gr 1 DD, no effusion - but an effusion was seen on CT which was felt to be increased in size compared to 12/2014 - pericardium also thickened.   ? Pericarditis   ? a. Dx 05/2015.  ? Pneumothorax, spontaneous, tension 01/20/2012  ? October, 2010  ? Pulmonary fibrosis ([place]) 01/20/2012  ? Due to previous surgery and chest radiation for lung cancer  ? Restrictive lung disease   ? Type II diabetes mellitus ([place])   ? ? ?Past Surgical History:  ?Procedure Laterality Date  ? ABDOMINAL HYSTERECTOMY  10/21/2006  ? APPENDECTOMY  1969?  ? BRONCHIAL WASHINGS  08/03/2021  ? Procedure: BRONCHIAL WASHINGS;  Surgeon: Rigoberto Noel, MD;  Location: [place];  Service: Cardiopulmonary;;  ? CARDIAC CATHETERIZATION N/A 07/11/2015  ? Procedure: Left Heart Cath and Coronary Angiography;  Surgeon: Jettie Booze, MD;  Location: [place] [facility];  Service: Cardiovascular;  Laterality: N/A;  ? CARDIAC CATHETERIZATION  07/11/2015  ? Procedure: Coronary Stent Intervention;  Surgeon: Jettie Booze, MD;  Location: [place] [facility];  Service: Cardiovascular;;  ? CARPAL TUNNEL RELEASE Bilateral 1998-2005?  ? right-left  ? CARPAL TUNNEL RELEASE Left 2009?  ? "for the 2nd time"  ? COLONOSCOPY    ? COLONOSCOPY WITH PROPOFOL N/A 05/28/2018  ? Procedure: COLONOSCOPY WITH PROPOFOL;  Surgeon: Jacobs, Daniel P, MD;  [facility];  Service: Endoscopy;  Laterality: N/A;  ? CORONARY ANGIOPLASTY    ? CYSTOSCOPY W/ STONE MANIPULATION  10/22/2011  ? ESOPHAGOGASTRODUODENOSCOPY (EGD) WITH ESOPHAGEAL DILATION  10/21/2010  ? FOOT SURGERY Bilateral 06/22/1979  ? Callous removed   ? INNER EAR SURGERY Right ~ 2009  ? KNEE ARTHROPLASTY Right  10/21/1989  ? KNEE ARTHROSCOPY  ~ 2000  ? LATERAL RELEASE  ? LEFT HEART CATH AND CORONARY ANGIOGRAPHY N/A 12/18/2016  ? Procedure: Left Heart Cath and Coronary Angiography;  Surgeon: Christopher D McAlhany, MD;  [facility];  Service: Cardiovascular;  Laterality: N/A;  ? LEFT HEART CATH AND CORONARY ANGIOGRAPHY N/A 02/01/2021  ? Procedure: LEFT HEART CATH AND CORONARY ANGIOGRAPHY;  Surgeon: Harding, David W, MD;  [facility];  Service: Cardiovascular;  Laterality: N/A;  ? LUNG CANCER SURGERY Left 10/22/1999  ? "small cell"  ? LUNG SURGERY Right 10/21/2005  ? "reinflated it"  ? PULMONARY EMBOLISM SURGERY  10/21/2005  ? LUNG COLLAPSE  ? RIGHT AND LEFT HEART CATH N/A 06/25/2017  ? Procedure: RIGHT AND LEFT HEART CATH;  Surgeon: Cooper, Michael, MD;  [facility];  Service: Cardiovascular;  Laterality: N/A;  ? SHOULDER ADHESION RELEASE Right ~ 2004  ? SHOULDER ARTHROSCOPY W/ ROTATOR CUFF REPAIR Right ~ 2003  ? UPPER GASTROINTESTINAL ENDOSCOPY  06/05/2021  ? VIDEO BRONCHOSCOPY N/A 08/03/2021  ? Procedure: VIDEO BRONCHOSCOPY WITHOUT FLUORO;  Surgeon: [name], Rakesh V, MD;  [facility];  Service: Cardiopulmonary;  Laterality: N/A;  ? ? ?Current Medications: ?Current Meds  ?Medication Sig  ? Accu-Chek Softclix Lancets lancets USE LANCETS 3 TIMES A DAY AS DIRECTED 30 DAYS  ? acetaminophen (TYLENOL) 650 MG CR tablet Take 1,300 mg by mouth every 8 (eight) hours as needed for pain (headache).  ? apixaban (ELIQUIS) 5 MG TABS tablet TAKE 1 TABLET BY MOUTH TWICE A DAY  ? azaTHIOprine (IMURAN) 50 MG tablet Take 150 mg by mouth every morning.  ? B-D UF III MINI PEN NEEDLES 31G X 5 MM MISC 2 (two) times daily. as directed  ? Blood Glucose Monitoring Suppl (ACCU-CHEK GUIDE) w/Device KIT See admin instructions.  ? co-enzyme Q-10 50 MG capsule TAKE 1 CAPSULE BY MOUTH EVERY DAY (Patient taking differently: Take 50 mg by mouth every morning.)  ? diltiazem (CARDIZEM CD) 360 MG 24  hr capsule TAKE 1 CAPSULE BY MOUTH EVERY DAY (Patient taking differently: 360 mg every morning.)  ? diphenhydrAMINE (BENADRYL) 25 MG tablet Take 50 mg by mouth at bedtime as needed for allergies.  ? diphenhydramine-acetaminophen (TYLENOL PM) 25-500 MG TABS tablet Take 2 tablets by mouth at bedtime as needed (sleep).  ? dofetilide (TIKOSYN) 500 MCG capsule Take 1 capsule (500 mcg total) by mouth 2 (two) times daily.  ? folic acid (FOLVITE) 1 MG tablet Take 1 mg by mouth every morning.  ? furosemide (LASIX) 40 MG tablet Take 1 tablet (40 mg total) by mouth daily as needed for fluid or edema.  ? Insulin Lispro Prot & Lispro (HUMALOG 75/25 MIX) (75-25) 100 UNIT/ML Kwikpen Inject 35-40 Units into the skin See admin instructions. Inject 40 units subcutaneously every morning and 35 units at night  ?   levothyroxine (SYNTHROID, LEVOTHROID) 50 MCG tablet Take 50 mcg by mouth daily before breakfast.   ? losartan (COZAAR) 25 MG tablet TAKE 1 TABLET BY MOUTH EVERY DAY (Patient taking differently: Take 25 mg by mouth every morning.)  ? metoprolol succinate (TOPROL-XL) 50 MG 24 hr tablet Take 1.5 tablets (75 mg total) by mouth at bedtime. Take with or immediately following a meal. (Patient taking differently: Take 50 mg by mouth at bedtime. Take with or immediately following a meal.)  ? OZEMPIC, 0.25 OR 0.5 MG/DOSE, 2 MG/1.5ML SOPN INJECT 0.5 MG INTO THE SKIN ONCE A WEEK.  ? pravastatin (PRAVACHOL) 20 MG tablet TAKE 1 TABLET BY MOUTH EVERY DAY (Patient taking differently: Take 20 mg by mouth at bedtime.)  ? spironolactone (ALDACTONE) 25 MG tablet Take 1 tablet (25 mg total) by mouth daily. (Patient taking differently: Take 25 mg by mouth every morning.)  ?  ? ?Allergies:   Hydrocodone, Morphine and related, Canagliflozin, Metformin hcl, Rosuvastatin, Lisinopril, and Nsaids  ? ?Social History  ? ?Socioeconomic History  ? Marital status: Single  ?  Spouse name: Not on file  ? Number of children: 0  ? Years of education: Not on file   ? Highest education level: Not on file  ?Occupational History  ? Occupation: RETIRED  ?Tobacco Use  ? Smoking status: Former  ?  Packs/day: 0.25  ?  Years: 5.00  ?  Pack years: 1.25  ?  Types: Cigarettes  ?

## 2022-02-05 DIAGNOSIS — H35033 Hypertensive retinopathy, bilateral: Secondary | ICD-10-CM | POA: Diagnosis not present

## 2022-02-05 DIAGNOSIS — H15002 Unspecified scleritis, left eye: Secondary | ICD-10-CM | POA: Diagnosis not present

## 2022-02-05 DIAGNOSIS — H15102 Unspecified episcleritis, left eye: Secondary | ICD-10-CM | POA: Diagnosis not present

## 2022-02-05 DIAGNOSIS — Z7985 Long-term (current) use of injectable non-insulin antidiabetic drugs: Secondary | ICD-10-CM | POA: Diagnosis not present

## 2022-02-05 DIAGNOSIS — H2513 Age-related nuclear cataract, bilateral: Secondary | ICD-10-CM | POA: Diagnosis not present

## 2022-02-05 DIAGNOSIS — Z79899 Other long term (current) drug therapy: Secondary | ICD-10-CM | POA: Diagnosis not present

## 2022-02-05 DIAGNOSIS — E113293 Type 2 diabetes mellitus with mild nonproliferative diabetic retinopathy without macular edema, bilateral: Secondary | ICD-10-CM | POA: Diagnosis not present

## 2022-02-05 DIAGNOSIS — Z794 Long term (current) use of insulin: Secondary | ICD-10-CM | POA: Diagnosis not present

## 2022-02-06 ENCOUNTER — Ambulatory Visit
Admission: RE | Admit: 2022-02-06 | Discharge: 2022-02-06 | Disposition: A | Discharge status: Home / Self Care | Payer: Medicare HMO | Source: Ambulatory Visit | Attending: Internal Medicine | Admitting: Internal Medicine

## 2022-02-06 ENCOUNTER — Encounter: Payer: Self-pay | Admitting: *Deleted

## 2022-02-06 ENCOUNTER — Ambulatory Visit (INDEPENDENT_AMBULATORY_CARE_PROVIDER_SITE_OTHER): Payer: Medicare HMO | Admitting: Interventional Cardiology

## 2022-02-06 ENCOUNTER — Encounter: Payer: Self-pay | Admitting: Interventional Cardiology

## 2022-02-06 ENCOUNTER — Ambulatory Visit: Payer: Medicare HMO

## 2022-02-06 VITALS — BP 122/68 | HR 56 | Ht 67.5 in | Wt 234.9 lb

## 2022-02-06 DIAGNOSIS — I2510 Atherosclerotic heart disease of native coronary artery without angina pectoris: Secondary | ICD-10-CM | POA: Diagnosis not present

## 2022-02-06 DIAGNOSIS — E785 Hyperlipidemia, unspecified: Secondary | ICD-10-CM | POA: Diagnosis not present

## 2022-02-06 DIAGNOSIS — I5032 Chronic diastolic (congestive) heart failure: Secondary | ICD-10-CM

## 2022-02-06 DIAGNOSIS — R079 Chest pain, unspecified: Secondary | ICD-10-CM | POA: Diagnosis not present

## 2022-02-06 DIAGNOSIS — D6869 Other thrombophilia: Secondary | ICD-10-CM | POA: Diagnosis not present

## 2022-02-06 DIAGNOSIS — I480 Paroxysmal atrial fibrillation: Secondary | ICD-10-CM

## 2022-02-06 DIAGNOSIS — I10 Essential (primary) hypertension: Secondary | ICD-10-CM | POA: Diagnosis not present

## 2022-02-06 DIAGNOSIS — Z1231 Encounter for screening mammogram for malignant neoplasm of breast: Secondary | ICD-10-CM

## 2022-02-06 DIAGNOSIS — G4733 Obstructive sleep apnea (adult) (pediatric): Secondary | ICD-10-CM

## 2022-02-06 NOTE — Patient Instructions (Signed)
Medication Instructions:  ?Your physician recommends that you continue on your current medications as directed. Please refer to the Current Medication list given to you today. ? ?*If you need a refill on your cardiac medications before your next appointment, please call your pharmacy* ? ? ?Lab Work: ?ESR and CRP today ? ?If you have labs (blood work) drawn today and your tests are completely normal, you will receive your results only by: ?MyChart Message (if you have MyChart) OR ?A paper copy in the mail ?If you have any lab test that is abnormal or we need to change your treatment, we will call you to review the results. ? ? ?Testing/Procedures: ?Your physician has requested that you have a lexiscan myoview. For further information please visit [email or web address]. Please follow instruction sheet, as given. ? ? ?Follow-Up: ?At [facility], you and your health needs are our priority.  As part of our continuing mission to provide you with exceptional heart care, we have created designated Provider Care Teams.  These Care Teams include your primary Cardiologist (physician) and Advanced Practice Providers (APPs -  Physician Assistants and Nurse Practitioners) who all work together to provide you with the care you need, when you need it. ? ?We recommend signing up for the patient portal called "MyChart".  Sign up information is provided on this After Visit Summary.  MyChart is used to connect with patients for Virtual Visits (Telemedicine).  Patients are able to view lab/test results, encounter notes, upcoming appointments, etc.  Non-urgent messages can be sent to your provider as well.   ?To learn more about what you can do with MyChart, go to [email or web address].   ? ?Your next appointment:   ?6-8 month(s) ? ?The format for your next appointment:   ?In Person ? ?Provider:   ?Sinclair Grooms, MD  ? ? ?Other Instructions ? ? ?Important Information About Sugar ? ? ? ? ?  ?

## 2022-02-07 LAB — C-REACTIVE PROTEIN: CRP: 5 mg/L (ref 0–10)

## 2022-02-07 LAB — SEDIMENTATION RATE: Sed Rate: 7 mm/hr (ref 0–40)

## 2022-02-08 NOTE — Addendum Note (Signed)
Addended by: Loren Racer on: 02/08/2022 10:12 AM ? ? Modules accepted: Orders ? ?

## 2022-02-08 NOTE — Addendum Note (Signed)
Addended by: Belva Crome on: 02/08/2022 10:31 AM ? ? Modules accepted: Orders ? ?

## 2022-02-11 ENCOUNTER — Other Ambulatory Visit: Payer: Medicare HMO | Admitting: *Deleted

## 2022-02-11 DIAGNOSIS — Z01812 Encounter for preprocedural laboratory examination: Secondary | ICD-10-CM

## 2022-02-11 DIAGNOSIS — I480 Paroxysmal atrial fibrillation: Secondary | ICD-10-CM

## 2022-02-12 ENCOUNTER — Telehealth: Payer: Self-pay | Admitting: *Deleted

## 2022-02-12 NOTE — Telephone Encounter (Signed)
Reviewed w/ pt how she is doing prior to upcoming ablation scheduled for 5/10. ?Advised to take 1/2 of her bedtime insulin the night before this procedure. ?Advised to call office if there are any concerns prior to procedure, and/or she has any questions. ?Patient verbalized understanding and agreeable to plan.  ? ?

## 2022-02-13 ENCOUNTER — Telehealth: Payer: Self-pay

## 2022-02-13 NOTE — Telephone Encounter (Signed)
Attempted to contact the patient. Unable to leave a message. Will try again later. S.Jenne Sellinger EMTP ?

## 2022-02-14 DIAGNOSIS — Z20822 Contact with and (suspected) exposure to covid-19: Secondary | ICD-10-CM | POA: Diagnosis not present

## 2022-02-18 ENCOUNTER — Telehealth (HOSPITAL_COMMUNITY): Payer: Self-pay | Admitting: *Deleted

## 2022-02-18 NOTE — Telephone Encounter (Signed)
Reaching out to patient to offer assistance regarding upcoming cardiac imaging study; pt verbalizes understanding of appt date/time, parking situation and where to check in, pre-test NPO  and verified current allergies; name and call back number provided for further questions should they arise ? ?Gordy Clement RN Navigator Cardiac Imaging ?[facility] ?[phone] office ?[phone] cell ? ?Patient to take her daily medications.  She is aware to arrive at 1pm. ?

## 2022-02-19 ENCOUNTER — Ambulatory Visit (HOSPITAL_BASED_OUTPATIENT_CLINIC_OR_DEPARTMENT_OTHER): Payer: Medicare HMO

## 2022-02-19 ENCOUNTER — Ambulatory Visit (HOSPITAL_COMMUNITY)
Admission: RE | Admit: 2022-02-19 | Discharge: 2022-02-19 | Disposition: A | Discharge status: Home / Self Care | Payer: Medicare HMO | Source: Ambulatory Visit | Attending: Cardiology | Admitting: Cardiology

## 2022-02-19 DIAGNOSIS — I480 Paroxysmal atrial fibrillation: Secondary | ICD-10-CM | POA: Insufficient documentation

## 2022-02-19 DIAGNOSIS — I2510 Atherosclerotic heart disease of native coronary artery without angina pectoris: Secondary | ICD-10-CM | POA: Insufficient documentation

## 2022-02-19 DIAGNOSIS — R079 Chest pain, unspecified: Secondary | ICD-10-CM | POA: Insufficient documentation

## 2022-02-19 MED ORDER — METOPROLOL TARTRATE 5 MG/5ML IV SOLN
10.0000 mg | INTRAVENOUS | Status: DC | PRN
  Administered 2022-02-19: 10 mg via INTRAVENOUS

## 2022-02-19 MED ORDER — METOPROLOL TARTRATE 5 MG/5ML IV SOLN
INTRAVENOUS | Status: AC
  Filled 2022-02-19: qty 20

## 2022-02-19 MED ORDER — TECHNETIUM TC 99M TETROFOSMIN IV KIT
30.2000 | PACK | Freq: Once | INTRAVENOUS | Status: AC | PRN
  Administered 2022-02-19: 30.2 via INTRAVENOUS
  Filled 2022-02-19: qty 31

## 2022-02-19 MED ORDER — REGADENOSON 0.4 MG/5ML IV SOLN
0.4000 mg | Freq: Once | INTRAVENOUS | Status: AC
  Administered 2022-02-19: 0.4 mg via INTRAVENOUS

## 2022-02-19 MED ORDER — IOHEXOL 350 MG/ML SOLN
100.0000 mL | Freq: Once | INTRAVENOUS | Status: AC | PRN
  Administered 2022-02-19: 100 mL via INTRAVENOUS

## 2022-02-20 ENCOUNTER — Ambulatory Visit (HOSPITAL_COMMUNITY): Payer: Medicare HMO | Attending: Cardiology

## 2022-02-20 LAB — MYOCARDIAL PERFUSION IMAGING
LV dias vol: 68 mL (ref 46–106)
LV sys vol: 25 mL
Nuc Stress EF: 63 %
Peak HR: 99 {beats}/min
Rest HR: 89 {beats}/min
Rest Nuclear Isotope Dose: 29.3 mCi
SDS: 1
SRS: 1
SSS: 2
ST Depression (mm): 0 mm
Stress Nuclear Isotope Dose: 30.2 mCi
TID: 1.04

## 2022-02-20 MED ORDER — TECHNETIUM TC 99M TETROFOSMIN IV KIT
29.3000 | PACK | Freq: Once | INTRAVENOUS | Status: AC | PRN
  Administered 2022-02-20: 29.3 via INTRAVENOUS
  Filled 2022-02-20: qty 30

## 2022-02-26 NOTE — Pre-Procedure Instructions (Signed)
Attempted to call patient regarding procedure instructions for tomorrow.  No answer. 

## 2022-02-27 ENCOUNTER — Ambulatory Visit (HOSPITAL_COMMUNITY): Payer: Medicare HMO | Admitting: Anesthesiology

## 2022-02-27 ENCOUNTER — Ambulatory Visit (HOSPITAL_BASED_OUTPATIENT_CLINIC_OR_DEPARTMENT_OTHER): Payer: Medicare HMO | Admitting: Anesthesiology

## 2022-02-27 ENCOUNTER — Other Ambulatory Visit: Payer: Self-pay

## 2022-02-27 ENCOUNTER — Encounter (HOSPITAL_COMMUNITY)
Admission: RE | Disposition: A | Discharge status: Home / Self Care | Payer: Medicare HMO | Source: Home / Self Care | Attending: Cardiology

## 2022-02-27 ENCOUNTER — Ambulatory Visit (HOSPITAL_COMMUNITY)
Admission: RE | Admit: 2022-02-27 | Discharge: 2022-02-27 | Disposition: A | Discharge status: Home / Self Care | Payer: Medicare HMO | Attending: Cardiology | Admitting: Cardiology

## 2022-02-27 DIAGNOSIS — I2510 Atherosclerotic heart disease of native coronary artery without angina pectoris: Secondary | ICD-10-CM | POA: Diagnosis not present

## 2022-02-27 DIAGNOSIS — E1151 Type 2 diabetes mellitus with diabetic peripheral angiopathy without gangrene: Secondary | ICD-10-CM | POA: Diagnosis not present

## 2022-02-27 DIAGNOSIS — G4730 Sleep apnea, unspecified: Secondary | ICD-10-CM | POA: Diagnosis not present

## 2022-02-27 DIAGNOSIS — Z87891 Personal history of nicotine dependence: Secondary | ICD-10-CM | POA: Insufficient documentation

## 2022-02-27 DIAGNOSIS — E119 Type 2 diabetes mellitus without complications: Secondary | ICD-10-CM | POA: Diagnosis not present

## 2022-02-27 DIAGNOSIS — J8410 Pulmonary fibrosis, unspecified: Secondary | ICD-10-CM | POA: Diagnosis not present

## 2022-02-27 DIAGNOSIS — I110 Hypertensive heart disease with heart failure: Secondary | ICD-10-CM | POA: Insufficient documentation

## 2022-02-27 DIAGNOSIS — I25119 Atherosclerotic heart disease of native coronary artery with unspecified angina pectoris: Secondary | ICD-10-CM

## 2022-02-27 DIAGNOSIS — I509 Heart failure, unspecified: Secondary | ICD-10-CM

## 2022-02-27 DIAGNOSIS — E039 Hypothyroidism, unspecified: Secondary | ICD-10-CM | POA: Diagnosis not present

## 2022-02-27 DIAGNOSIS — J449 Chronic obstructive pulmonary disease, unspecified: Secondary | ICD-10-CM | POA: Diagnosis not present

## 2022-02-27 DIAGNOSIS — I4819 Other persistent atrial fibrillation: Secondary | ICD-10-CM | POA: Insufficient documentation

## 2022-02-27 DIAGNOSIS — I4891 Unspecified atrial fibrillation: Secondary | ICD-10-CM | POA: Diagnosis not present

## 2022-02-27 HISTORY — PX: ATRIAL FIBRILLATION ABLATION: EP1191

## 2022-02-27 LAB — GLUCOSE, CAPILLARY
Glucose-Capillary: 100 mg/dL — ABNORMAL HIGH (ref 70–99)
Glucose-Capillary: 90 mg/dL (ref 70–99)

## 2022-02-27 LAB — POCT ACTIVATED CLOTTING TIME
Activated Clotting Time: 269 seconds
Activated Clotting Time: 335 seconds

## 2022-02-27 SURGERY — ATRIAL FIBRILLATION ABLATION
Anesthesia: General

## 2022-02-27 MED ORDER — HEPARIN SODIUM (PORCINE) 1000 UNIT/ML IJ SOLN
INTRAMUSCULAR | Status: AC
  Filled 2022-02-27: qty 10

## 2022-02-27 MED ORDER — SODIUM CHLORIDE 0.9% FLUSH: 3.0000 mL | Freq: Two times a day (BID) | INTRAVENOUS | Status: DC

## 2022-02-27 MED ORDER — APIXABAN 5 MG PO TABS
5.0000 mg | ORAL_TABLET | Freq: Once | ORAL | Status: AC
  Administered 2022-02-27: 5 mg via ORAL
  Filled 2022-02-27: qty 1

## 2022-02-27 MED ORDER — HEPARIN (PORCINE) IN NACL 1000-0.9 UT/500ML-% IV SOLN
INTRAVENOUS | Status: DC | PRN
  Administered 2022-02-27 (×5): 500 mL

## 2022-02-27 MED ORDER — COLCHICINE 0.6 MG PO TABS: 0.6000 mg | ORAL_TABLET | Freq: Every day | ORAL | 0 refills | Status: DC

## 2022-02-27 MED ORDER — ROCURONIUM BROMIDE 10 MG/ML (PF) SYRINGE
PREFILLED_SYRINGE | INTRAVENOUS | Status: DC | PRN
  Administered 2022-02-27: 70 mg via INTRAVENOUS

## 2022-02-27 MED ORDER — SODIUM CHLORIDE 0.9% FLUSH: 3.0000 mL | INTRAVENOUS | Status: DC | PRN

## 2022-02-27 MED ORDER — HEPARIN SODIUM (PORCINE) 1000 UNIT/ML IJ SOLN
INTRAMUSCULAR | Status: DC | PRN
  Administered 2022-02-27: 2000 [IU] via INTRAVENOUS
  Administered 2022-02-27: 7000 [IU] via INTRAVENOUS
  Administered 2022-02-27: 15000 [IU] via INTRAVENOUS

## 2022-02-27 MED ORDER — PHENYLEPHRINE HCL-NACL 20-0.9 MG/250ML-% IV SOLN
INTRAVENOUS | Status: DC | PRN
  Administered 2022-02-27: 25 ug/min via INTRAVENOUS

## 2022-02-27 MED ORDER — LIDOCAINE 2% (20 MG/ML) 5 ML SYRINGE
INTRAMUSCULAR | Status: DC | PRN
Start: 2022-02-27 — End: 2022-02-27
  Administered 2022-02-27: 50 mg via INTRAVENOUS

## 2022-02-27 MED ORDER — ACETAMINOPHEN 325 MG PO TABS
650.0000 mg | ORAL_TABLET | ORAL | Status: DC | PRN
  Filled 2022-02-27: qty 2

## 2022-02-27 MED ORDER — SODIUM CHLORIDE 0.9 % IV SOLN: 250.0000 mL | INTRAVENOUS | Status: DC | PRN

## 2022-02-27 MED ORDER — SODIUM CHLORIDE 0.9 % IV SOLN: INTRAVENOUS | Status: DC

## 2022-02-27 MED ORDER — HEPARIN SODIUM (PORCINE) 1000 UNIT/ML IJ SOLN
INTRAMUSCULAR | Status: DC | PRN
  Administered 2022-02-27: 1000 [IU] via INTRAVENOUS

## 2022-02-27 MED ORDER — SUGAMMADEX SODIUM 200 MG/2ML IV SOLN
INTRAVENOUS | Status: DC | PRN
  Administered 2022-02-27: 200 mg via INTRAVENOUS

## 2022-02-27 MED ORDER — DOBUTAMINE INFUSION FOR EP/ECHO/NUC (1000 MCG/ML)
INTRAVENOUS | Status: AC
  Filled 2022-02-27: qty 250

## 2022-02-27 MED ORDER — PROPOFOL 10 MG/ML IV BOLUS
INTRAVENOUS | Status: DC | PRN
  Administered 2022-02-27: 150 mg via INTRAVENOUS
  Administered 2022-02-27: 50 mg via INTRAVENOUS

## 2022-02-27 MED ORDER — DOBUTAMINE INFUSION FOR EP/ECHO/NUC (1000 MCG/ML)
INTRAVENOUS | Status: DC | PRN
  Administered 2022-02-27: 10 ug/kg/min via INTRAVENOUS

## 2022-02-27 MED ORDER — ONDANSETRON HCL 4 MG/2ML IJ SOLN: 4.0000 mg | Freq: Four times a day (QID) | INTRAMUSCULAR | Status: DC | PRN

## 2022-02-27 MED ORDER — PROTAMINE SULFATE 10 MG/ML IV SOLN
INTRAVENOUS | Status: DC | PRN
  Administered 2022-02-27 (×4): 10 mg via INTRAVENOUS

## 2022-02-27 SURGICAL SUPPLY — 20 items
BAG SNAP BAND KOVER 36X36 (MISCELLANEOUS) ×1 IMPLANT
CATH OCTARAY 2.0 F 3-3-3-3-3 (CATHETERS) ×1 IMPLANT
CATH S CIRCA THERM PROBE 10F (CATHETERS) ×1 IMPLANT
CATH SMTCH THERMOCOOL SF DF (CATHETERS) ×1 IMPLANT
CATH SOUNDSTAR ECO 8FR (CATHETERS) ×1 IMPLANT
CATH WEBSTER BI DIR CS D-F CRV (CATHETERS) ×1 IMPLANT
CLOSURE PERCLOSE PROSTYLE (VASCULAR PRODUCTS) ×4 IMPLANT
COVER SWIFTLINK CONNECTOR (BAG) ×3 IMPLANT
KIT VERSACROSS STEERABLE D1 (CATHETERS) ×1 IMPLANT
MAT PREVALON FULL STRYKER (MISCELLANEOUS) ×1 IMPLANT
PACK EP LATEX FREE (CUSTOM PROCEDURE TRAY) ×2
PACK EP LF (CUSTOM PROCEDURE TRAY) ×2 IMPLANT
PAD DEFIB RADIO PHYSIO CONN (PAD) ×3 IMPLANT
PATCH CARTO3 (PAD) ×1 IMPLANT
SHEATH CARTO VIZIGO SM CVD (SHEATH) ×1 IMPLANT
SHEATH PINNACLE 7F 10CM (SHEATH) ×1 IMPLANT
SHEATH PINNACLE 8F 10CM (SHEATH) ×2 IMPLANT
SHEATH PINNACLE 9F 10CM (SHEATH) ×1 IMPLANT
SHEATH PROBE COVER 6X72 (BAG) ×1 IMPLANT
TUBING SMART ABLATE COOLFLOW (TUBING) ×1 IMPLANT

## 2022-02-27 NOTE — H&P (Signed)
? ?Electrophysiology Office Note ? ? ?Date:  02/27/2022  ? ?ID:  [name], DOB 06-Apr-1956, MRN [id number] ? ?PCP:  Mckinley Jewel, MD  ?Cardiologist:  Tamala Julian ?Primary Electrophysiologist:  Segundo Makela Meredith Leeds, MD   ? ?Chief Complaint: AF ?  ?History of Present Illness: ?[name] is a 66 y.o. female who is being seen today for the evaluation of AF at the request of No ref. provider found. Presenting today for electrophysiology evaluation. ? ?He has a history significant for coronary artery disease status post circumflex and LAD stents, chronic pericardial effusion with chronic pericarditis and friction rub, CHF, diabetes, hypertension, non-small cell lung cancer status post surgery and chemotherapy, COPD, pulmonary fibrosis, atrial fibrillation.  She was admitted to the hospital April 2020 and was loaded on dofetilide. ? ?She was recently in the hospital for chest pain and was ruled out for coronary issues.  She was also having episodes of palpitations that she felt was related to stress.  She was monitored on telemetry without evidence of atrial fibrillation. ? ?Today, denies symptoms of palpitations, chest pain, shortness of breath, orthopnea, PND, lower extremity edema, claudication, dizziness, presyncope, syncope, bleeding, or neurologic sequela. The patient is tolerating medications without difficulties. Plan ablation today.  ? ? ?Past Medical History:  ?Diagnosis Date  ? Allergy   ? Anemia   ? Anemia   ? Aortic insufficiency   ? a. Mod by echo 05/2015.  ? Arthritis   ? "knees" (07/11/2015)  ? Atrial fibrillation ([place])   ? On Eliquis  ? Carcinoma of hilus of lung ([place]) 01/20/2012  ? IIIB NSCL  Right hilar mass compressing esophagus/presenting with dysphagia Rx Surgery/RT/chemo dx January 2001  ? Coronary artery disease   ? a. Abnormal stress test -> LHc 06/2015 s/p DES to mCX and mLAD, residual D2 disease treated medially.  ? Diverticulitis   ? Elevated blood pressure   ? Erythema nodosum   ? Heart murmur    ? Insomnia   ? lung ca dx'd 10/1999  ? chemo/xrt comp 05/29/2000  ? Myalgia   ? Nephrolithiasis 2015  ? Obesity   ? Pericardial effusion   ? a. 05/2015 Echo: EF 55-60%, no rwma, Gr 1 DD, no effusion - but an effusion was seen on CT which was felt to be increased in size compared to 12/2014 - pericardium also thickened.   ? Pericarditis   ? a. Dx 05/2015.  ? Pneumothorax, spontaneous, tension 01/20/2012  ? October, 2010  ? Pulmonary fibrosis ([place]) 01/20/2012  ? Due to previous surgery and chest radiation for lung cancer  ? Restrictive lung disease   ? Type II diabetes mellitus ([place])   ? ?Past Surgical History:  ?Procedure Laterality Date  ? ABDOMINAL HYSTERECTOMY  10/21/2006  ? APPENDECTOMY  1969?  ? BRONCHIAL WASHINGS  08/03/2021  ? Procedure: BRONCHIAL WASHINGS;  Surgeon: Rigoberto Noel, MD;  Location: [place];  Service: Cardiopulmonary;;  ? CARDIAC CATHETERIZATION N/A 07/11/2015  ? Procedure: Left Heart Cath and Coronary Angiography;  Surgeon: Jettie Booze, MD;  Location: [place] [facility];  Service: Cardiovascular;  Laterality: N/A;  ? CARDIAC CATHETERIZATION  07/11/2015  ? Procedure: Coronary Stent Intervention;  Surgeon: Jettie Booze, MD;  Location: [place] [facility];  Service: Cardiovascular;;  ? CARPAL TUNNEL RELEASE Bilateral 1998-2005?  ? right-left  ? CARPAL TUNNEL RELEASE Left 2009?  ? "for the 2nd time"  ? COLONOSCOPY    ? COLONOSCOPY WITH PROPOFOL N/A 05/28/2018  ?  Procedure: COLONOSCOPY WITH PROPOFOL;  Surgeon: Milus Banister, MD;  [facility];  Service: Endoscopy;  Laterality: N/A;  ? CORONARY ANGIOPLASTY    ? CYSTOSCOPY W/ STONE MANIPULATION  10/22/2011  ? ESOPHAGOGASTRODUODENOSCOPY (EGD) WITH ESOPHAGEAL DILATION  10/21/2010  ? FOOT SURGERY Bilateral 06/22/1979  ? Callous removed   ? INNER EAR SURGERY Right ~ 2009  ? KNEE ARTHROPLASTY Right 10/21/1989  ? KNEE ARTHROSCOPY  ~ 2000  ? LATERAL RELEASE  ? LEFT HEART CATH AND CORONARY ANGIOGRAPHY N/A 12/18/2016  ? Procedure:  Left Heart Cath and Coronary Angiography;  Surgeon: Burnell Blanks, MD;  Location: [place] [facility];  Service: Cardiovascular;  Laterality: N/A;  ? LEFT HEART CATH AND CORONARY ANGIOGRAPHY N/A 02/01/2021  ? Procedure: LEFT HEART CATH AND CORONARY ANGIOGRAPHY;  Surgeon: Leonie Man, MD;  Location: [place] [facility];  Service: Cardiovascular;  Laterality: N/A;  ? LUNG CANCER SURGERY Left 10/22/1999  ? "small cell"  ? LUNG SURGERY Right 10/21/2005  ? "reinflated it"  ? PULMONARY EMBOLISM SURGERY  10/21/2005  ? LUNG COLLAPSE  ? RIGHT AND LEFT HEART CATH N/A 06/25/2017  ? Procedure: RIGHT AND LEFT HEART CATH;  Surgeon: Sherren Mocha, MD;  Location: [place] [facility];  Service: Cardiovascular;  Laterality: N/A;  ? SHOULDER ADHESION RELEASE Right ~ 2004  ? SHOULDER ARTHROSCOPY W/ ROTATOR CUFF REPAIR Right ~ 2003  ? UPPER GASTROINTESTINAL ENDOSCOPY  06/05/2021  ? VIDEO BRONCHOSCOPY N/A 08/03/2021  ? Procedure: VIDEO BRONCHOSCOPY WITHOUT FLUORO;  Surgeon: Rigoberto Noel, MD;  Location: [place];  Service: Cardiopulmonary;  Laterality: N/A;  ? ? ? ?Current Facility-Administered Medications  ?Medication Dose Route Frequency Provider Last Rate Last Admin  ? 0.9 %  sodium chloride infusion   Intravenous Continuous Dazani Norby, Ocie Doyne, MD      ? ? ?Allergies:   Hydrocodone, Morphine and related, Crestor [rosuvastatin], Invokana [canagliflozin], Metformin hcl, Lisinopril, and Nsaids  ? ?Social History:  The patient  reports that she quit smoking about 22 years ago. Her smoking use included cigarettes. She has a 1.25 pack-year smoking history. She has never used smokeless tobacco. She reports that she does not drink alcohol and does not use drugs.  ? ?Family History:  The patient's family history includes Diabetes in her father, maternal grandmother, mother, paternal grandmother, sister, sister, sister, and sister; Heart disease in her father; Hypertension in her sister; Kidney disease in her sister;  Pancreatic cancer in her mother.  ? ?ROS:  Please see the history of present illness.   Otherwise, review of systems is positive for none.   All other systems are reviewed and negative.  ? ?PHYSICAL EXAM: ?VS:  BP 134/72   Pulse 73   Temp 98.1 ?F (36.7 ?C) (Oral)   Resp 18   Ht 5' 7.5" (1.715 m)   Wt 102.1 kg   SpO2 99%   BMI 34.72 kg/m?  , BMI Body mass index is 34.72 kg/m?. ?GEN: Well nourished, well developed, in no acute distress  ?HEENT: normal  ?Neck: no JVD, carotid bruits, or masses ?Cardiac: RRR; no murmurs, rubs, or gallops,no edema  ?Respiratory:  clear to auscultation bilaterally, normal work of breathing ?GI: soft, nontender, nondistended, + BS ?MS: no deformity or atrophy  ?Skin: warm and dry ?Neuro:  Strength and sensation are intact ?Psych: euthymic mood, full affect ? ?EKG:  EKG is ordered today. ?Personal review of the ekg ordered shows sinus rhythm  ? ?Recent Labs: ?04/25/2021: ALT 9 ?11/26/2021: Magnesium 1.9 ?  11/27/2021: BUN 18; Creatinine, Ser 1.00; Hemoglobin 10.9; Platelets 310; Potassium 4.3; Sodium 141; TSH 1.915  ? ? ?Lipid Panel  ?   ?Component Value Date/Time  ? CHOL 127 11/27/2021 0408  ? CHOL 130 04/25/2021 1614  ? TRIG 72 11/27/2021 0408  ? HDL 44 11/27/2021 0408  ? HDL 44 04/25/2021 1614  ? CHOLHDL 2.9 11/27/2021 0408  ? VLDL 14 11/27/2021 0408  ? [place] 69 11/27/2021 0408  ? [place] 67 04/25/2021 1614  ? ? ? ?Wt Readings from Last 3 Encounters:  ?02/27/22 102.1 kg  ?02/19/22 106.1 kg  ?02/06/22 106.5 kg  ?  ? ? ?Other studies Reviewed: ?Additional studies/ records that were reviewed today include: TTE 02/17/19  ?Review of the above records today demonstrates:  ? 1. The left ventricle has hyperdynamic systolic function, with an ejection fraction of >65%. The cavity size was normal. There is moderately increased left ventricular wall thickness. Left ventricular diastolic Doppler parameters are consistent with  ?pseudonormalization. Elevated left atrial and left ventricular  end-diastolic pressures The E/e' is >15. No evidence of left ventricular regional wall motion abnormalities. ? 2. The right ventricle has normal systolic function. The cavity was normal. There is no increase in right ventric

## 2022-02-27 NOTE — Transfer of Care (Signed)
Immediate Anesthesia Transfer of Care Note ? ?Patient: TALONDA ARTIST ? ?Procedure(s) Performed: ATRIAL FIBRILLATION ABLATION ? ?Patient Location: PACU ? ?Anesthesia Type:General ? ?Level of Consciousness: drowsy and patient cooperative ? ?Airway & Oxygen Therapy: Patient Spontanous Breathing and Patient connected to nasal cannula oxygen ? ?Post-op Assessment: Report given to RN and Post -op Vital signs reviewed and stable ? ?Post vital signs: Reviewed and stable ? ?Last Vitals:  ?Vitals Value Taken Time  ?BP 139/44 02/27/22 1058  ?Temp    ?Pulse 91 02/27/22 1100  ?Resp 18 02/27/22 1100  ?SpO2 100 % 02/27/22 1100  ?Vitals shown include unvalidated device data. ? ?Last Pain:  ?Vitals:  ? 02/27/22 1101  ?TempSrc:   ?PainSc: 0-No pain  ?   ? ?Patients Stated Pain Goal: 3 (02/27/22 9629) ? ?Complications: There were no known notable events for this encounter. ?

## 2022-02-27 NOTE — Anesthesia Procedure Notes (Signed)
Procedure Name: Intubation ?Date/Time: 02/27/2022 9:01 AM ?Performed by: Georgia Duff, CRNA ?Pre-anesthesia Checklist: Patient identified, Emergency Drugs available, Suction available and Patient being monitored ?Patient Re-evaluated:Patient Re-evaluated prior to induction ?Oxygen Delivery Method: Circle System Utilized ?Preoxygenation: Pre-oxygenation with 100% oxygen ?Induction Type: IV induction ?Ventilation: Mask ventilation without difficulty ?Laryngoscope Size: Sabra Heck and 2 ?Grade View: Grade I ?Tube type: Oral ?Tube size: 7.0 mm ?Number of attempts: 1 ?Airway Equipment and Method: Stylet and Oral airway ?Placement Confirmation: ETT inserted through vocal cords under direct vision, positive ETCO2 and breath sounds checked- equal and bilateral ?Secured at: 21 cm ?Tube secured with: Tape ?Dental Injury: Teeth and Oropharynx as per pre-operative assessment  ? ? ? ? ?

## 2022-02-27 NOTE — Anesthesia Preprocedure Evaluation (Signed)
Anesthesia Evaluation  ?Patient identified by MRN, date of birth, ID band ?Patient awake ? ? ? ?Reviewed: ?Allergy & Precautions, H&P , NPO status , Patient's Chart, lab work & pertinent test results ? ?Airway ?Mallampati: II ? ? ?Neck ROM: full ? ? ? Dental ?  ?Pulmonary ?shortness of breath, sleep apnea , former smoker,  ?  ?breath sounds clear to auscultation ? ? ? ? ? ? Cardiovascular ?hypertension, + angina + CAD, + Cardiac Stents, + Peripheral Vascular Disease and +CHF  ?+ dysrhythmias Atrial Fibrillation + Valvular Problems/Murmurs AI  ?Rhythm:regular Rate:Normal ? ?Cardiology clearance in chart.  Moderate AI by echo. Normal EF. ?  ?Neuro/Psych ?  ? GI/Hepatic ?  ?Endo/Other  ?diabetesHypothyroidism  ? Renal/GU ?  ? ?  ?Musculoskeletal ? ?(+) Arthritis ,  ? Abdominal ?  ?Peds ? Hematology ?  ?Anesthesia Other Findings ? ? Reproductive/Obstetrics ? ?  ? ? ? ? ? ? ? ? ? ? ? ? ? ?  ?  ? ? ? ? ? ? ? ? ?Anesthesia Physical ?Anesthesia Plan ? ?ASA: 3 ? ?Anesthesia Plan: General  ? ?Post-op Pain Management:   ? ?Induction: Intravenous ? ?PONV Risk Score and Plan: 3 and Ondansetron, Dexamethasone, Midazolam and Treatment may vary due to age or medical condition ? ?Airway Management Planned: Oral ETT ? ?Additional Equipment:  ? ?Intra-op Plan:  ? ?Post-operative Plan: Extubation in OR ? ?Informed Consent: I have reviewed the patients History and Physical, chart, labs and discussed the procedure including the risks, benefits and alternatives for the proposed anesthesia with the patient or authorized representative who has indicated his/her understanding and acceptance.  ? ? ? ?Dental advisory given ? ?Plan Discussed with: Anesthesiologist, CRNA and Surgeon ? ?Anesthesia Plan Comments:   ? ? ? ? ? ? ?Anesthesia Quick Evaluation ? ?

## 2022-02-27 NOTE — Progress Notes (Signed)
Patient ambulated to end of bed and began bleeding from her L groin site at 1430. Patient laid back down, pressure applied for 15 minutes, and bleeding resolved. Camnitz, MD made aware. No further orders. Will continue to monitor.  ?

## 2022-02-27 NOTE — Discharge Instructions (Signed)
Post procedure care instructions ?No driving for 4 days. No lifting over 5 lbs for 1 week. No vigorous or sexual activity for 1 week. You may return to work/your usual activities on 03/07/22. Keep procedure site clean & dry. If you notice increased pain, swelling, bleeding or pus, call/return!  You may shower after 24 hours, but no soaking in baths/hot tubs/pools for 1 week.  ? ? ?You have an appointment set up with the [facility].  Multiple studies have shown that being followed by a dedicated atrial fibrillation clinic in addition to the standard care you receive from your other physicians improves health. We believe that enrollment in the atrial fibrillation clinic will allow [place] to better care for you.  ? ?The phone number to the [facility] is [phone]. The clinic is staffed Monday through Friday from 8:30am to 5pm. ? ?Parking Directions: The clinic is located in the Heart and Vascular Building connected to [facility]. ?1)From [address] turn on to [address] and go to the 3rd entrance  (Heart and Vascular entrance) on the right. ?2)Look to the right for Heart &Vascular Parking Garage. ?3)A code for the entrance is required, for June is 1004.   ?4)Take the elevators to the 1st floor. Registration is in the room with the glass walls at the end of the hallway. ? ?If you have any trouble parking or locating the clinic, please don?t hesitate to call [phone].  ?

## 2022-02-27 NOTE — Progress Notes (Signed)
[place], MD by to see patient and stated to extend bedrest to 1545 and patient can drive on Saturday.  ?

## 2022-02-28 ENCOUNTER — Encounter (HOSPITAL_COMMUNITY): Payer: Self-pay | Admitting: Cardiology

## 2022-02-28 NOTE — Anesthesia Postprocedure Evaluation (Signed)
Anesthesia Post Note ? ?Patient: JIL PENLAND ? ?Procedure(s) Performed: ATRIAL FIBRILLATION ABLATION ? ?  ? ?Patient location during evaluation: PACU ?Anesthesia Type: General ?Level of consciousness: awake and alert ?Pain management: pain level controlled ?Vital Signs Assessment: post-procedure vital signs reviewed and stable ?Respiratory status: spontaneous breathing, nonlabored ventilation, respiratory function stable and patient connected to nasal cannula oxygen ?Cardiovascular status: blood pressure returned to baseline and stable ?Postop Assessment: no apparent nausea or vomiting ?Anesthetic complications: no ? ? ?There were no known notable events for this encounter. ? ?Last Vitals:  ?Vitals:  ? 02/27/22 1525 02/27/22 1544  ?BP: 128/65   ?Pulse:  97  ?Resp:    ?Temp:    ?SpO2:  100%  ?  ?Last Pain:  ?Vitals:  ? 02/27/22 1145  ?TempSrc:   ?PainSc: 0-No pain  ? ? ?  ?  ?  ?  ?  ?  ? ?Gravity S ? ? ? ? ?

## 2022-03-05 DIAGNOSIS — I10 Essential (primary) hypertension: Secondary | ICD-10-CM | POA: Diagnosis not present

## 2022-03-05 DIAGNOSIS — I4891 Unspecified atrial fibrillation: Secondary | ICD-10-CM | POA: Diagnosis not present

## 2022-03-05 DIAGNOSIS — E7800 Pure hypercholesterolemia, unspecified: Secondary | ICD-10-CM | POA: Diagnosis not present

## 2022-03-05 DIAGNOSIS — E1165 Type 2 diabetes mellitus with hyperglycemia: Secondary | ICD-10-CM | POA: Diagnosis not present

## 2022-03-05 DIAGNOSIS — I2510 Atherosclerotic heart disease of native coronary artery without angina pectoris: Secondary | ICD-10-CM | POA: Diagnosis not present

## 2022-03-05 DIAGNOSIS — E039 Hypothyroidism, unspecified: Secondary | ICD-10-CM | POA: Diagnosis not present

## 2022-03-06 ENCOUNTER — Other Ambulatory Visit: Payer: Self-pay | Admitting: Cardiology

## 2022-03-27 ENCOUNTER — Ambulatory Visit (HOSPITAL_COMMUNITY)
Admission: RE | Admit: 2022-03-27 | Discharge: 2022-03-27 | Disposition: A | Discharge status: Home / Self Care | Payer: Medicare HMO | Source: Ambulatory Visit | Attending: Physician Assistant | Admitting: Physician Assistant

## 2022-03-27 VITALS — BP 124/56 | HR 86 | Ht 67.5 in | Wt 234.8 lb

## 2022-03-27 DIAGNOSIS — Z794 Long term (current) use of insulin: Secondary | ICD-10-CM | POA: Diagnosis not present

## 2022-03-27 DIAGNOSIS — Z7985 Long-term (current) use of injectable non-insulin antidiabetic drugs: Secondary | ICD-10-CM | POA: Insufficient documentation

## 2022-03-27 DIAGNOSIS — Z9221 Personal history of antineoplastic chemotherapy: Secondary | ICD-10-CM | POA: Diagnosis not present

## 2022-03-27 DIAGNOSIS — Z85118 Personal history of other malignant neoplasm of bronchus and lung: Secondary | ICD-10-CM | POA: Diagnosis not present

## 2022-03-27 DIAGNOSIS — I480 Paroxysmal atrial fibrillation: Secondary | ICD-10-CM | POA: Insufficient documentation

## 2022-03-27 DIAGNOSIS — E119 Type 2 diabetes mellitus without complications: Secondary | ICD-10-CM | POA: Insufficient documentation

## 2022-03-27 DIAGNOSIS — I351 Nonrheumatic aortic (valve) insufficiency: Secondary | ICD-10-CM | POA: Diagnosis not present

## 2022-03-27 DIAGNOSIS — D6869 Other thrombophilia: Secondary | ICD-10-CM

## 2022-03-27 DIAGNOSIS — Z8249 Family history of ischemic heart disease and other diseases of the circulatory system: Secondary | ICD-10-CM | POA: Diagnosis not present

## 2022-03-27 DIAGNOSIS — D6489 Other specified anemias: Secondary | ICD-10-CM | POA: Diagnosis not present

## 2022-03-27 DIAGNOSIS — Z7901 Long term (current) use of anticoagulants: Secondary | ICD-10-CM | POA: Diagnosis not present

## 2022-03-27 DIAGNOSIS — Z6836 Body mass index (BMI) 36.0-36.9, adult: Secondary | ICD-10-CM | POA: Diagnosis not present

## 2022-03-27 DIAGNOSIS — E669 Obesity, unspecified: Secondary | ICD-10-CM | POA: Diagnosis not present

## 2022-03-27 DIAGNOSIS — Z8679 Personal history of other diseases of the circulatory system: Secondary | ICD-10-CM | POA: Diagnosis not present

## 2022-03-27 DIAGNOSIS — I5032 Chronic diastolic (congestive) heart failure: Secondary | ICD-10-CM | POA: Diagnosis not present

## 2022-03-27 DIAGNOSIS — Z7182 Exercise counseling: Secondary | ICD-10-CM | POA: Diagnosis not present

## 2022-03-27 DIAGNOSIS — J8410 Pulmonary fibrosis, unspecified: Secondary | ICD-10-CM | POA: Diagnosis not present

## 2022-03-27 DIAGNOSIS — I3139 Other pericardial effusion (noninflammatory): Secondary | ICD-10-CM | POA: Insufficient documentation

## 2022-03-27 DIAGNOSIS — Z923 Personal history of irradiation: Secondary | ICD-10-CM | POA: Insufficient documentation

## 2022-03-27 DIAGNOSIS — Z833 Family history of diabetes mellitus: Secondary | ICD-10-CM | POA: Insufficient documentation

## 2022-03-27 DIAGNOSIS — Z79899 Other long term (current) drug therapy: Secondary | ICD-10-CM | POA: Diagnosis not present

## 2022-03-27 DIAGNOSIS — J449 Chronic obstructive pulmonary disease, unspecified: Secondary | ICD-10-CM | POA: Diagnosis not present

## 2022-03-27 DIAGNOSIS — Z955 Presence of coronary angioplasty implant and graft: Secondary | ICD-10-CM | POA: Diagnosis not present

## 2022-03-27 DIAGNOSIS — I2510 Atherosclerotic heart disease of native coronary artery without angina pectoris: Secondary | ICD-10-CM | POA: Diagnosis not present

## 2022-03-27 DIAGNOSIS — I110 Hypertensive heart disease with heart failure: Secondary | ICD-10-CM | POA: Diagnosis not present

## 2022-03-27 LAB — MAGNESIUM: Magnesium: 2 mg/dL (ref 1.7–2.4)

## 2022-03-27 NOTE — Progress Notes (Signed)
Primary Care Physician: Mckinley Jewel, MD Primary Cardiologist: Dr Tamala Julian Primary Electrophysiologist: Dr Curt Bears Referring Physician: Dr Lona Kettle [name] is a 66 y.o. female with a history of paroxysmal atrial fibrillation, CAD s/p DES to [facility] and mLAD 07/11/15 (patent stents on repeat cath 11/2016), chronic pericardial effusion, h/o chronic pericarditis with chronic pericardial rub treated intermittently with steroids and colchicine, chronic diastolic CHF, DM, HTN, remote h/o treated non-small cell lung cancer, pulmonary fibrosis, aortic insufficiency, COPD and chronic anemia who presents for follow up in the [facility]. Patient was admitted for symptomatic atrial fibrillation with RVR 01/2019. She was started on Tikosyn during her admission. She denies snoring or significant alcohol use. Patient was seen in the ER on 01/23/20 with symptoms of heart racing and SOB. She was found to be in afib with RVR. She converted with IV metoprolol. She did get her second COVID vaccine on 01/19/20. There were no other specific triggers that the patient could identify.   On follow up today, patient is s/p afib ablation with Dr Curt Bears on 02/27/22. Patient reports that she has done well post ablation. She has more energy (can walk farther) and gets less SOB. She did have one episode of afib soon after ablation when she got news that her sister had been injured in a fall. No other episodes since then. She does have some left groin tenderness but this has continued to improve. No swallowing issues.   Today, she denies symptoms of palpitations, chest pain, shortness of breath, orthopnea, PND, lower extremity edema, dizziness, presyncope, syncope, snoring, daytime somnolence, bleeding, or neurologic sequela. The patient is tolerating medications without difficulties and is otherwise without complaint today.    Atrial Fibrillation Risk Factors:  she does not have symptoms or diagnosis  of sleep apnea. she does not have a history of rheumatic fever. she does not have a history of alcohol use. The patient does not have a history of early familial atrial fibrillation or other arrhythmias.  she has a BMI of Body mass index is 36.23 kg/m.[name] Filed Weights   03/27/22 1121  Weight: 106.5 kg     Family History  Problem Relation Age of Onset   Heart disease Father    Diabetes Father    Pancreatic cancer Mother    Diabetes Mother    Hypertension Sister        x 3   Diabetes Sister        x 4   Diabetes Maternal Grandmother    Diabetes Paternal Grandmother    Diabetes Sister    Diabetes Sister    Diabetes Sister    Kidney disease Sister    Colon cancer Neg Hx    Colon polyps Neg Hx    Rectal cancer Neg Hx    Stomach cancer Neg Hx    Esophageal cancer Neg Hx    Breast cancer Neg Hx      Atrial Fibrillation Management history:  Previous antiarrhythmic drugs: dofetilide Previous cardioversions: none Previous ablations: 02/27/22 CHADS2VASC score: 4 (female, CAD, HTN, DM) Anticoagulation history: Eliquis   Past Medical History:  Diagnosis Date   Allergy    Anemia    Anemia    Aortic insufficiency    a. Mod by echo 05/2015.   Arthritis    "knees" (07/11/2015)   Atrial fibrillation (HCC)    On Eliquis   Carcinoma of hilus of lung ([place]) 01/20/2012   IIIB NSCL  Right hilar mass  compressing esophagus/presenting with dysphagia Rx Surgery/RT/chemo dx January 2001   Coronary artery disease    a. Abnormal stress test -> LHc 06/2015 s/p DES to mCX and mLAD, residual D2 disease treated medially.   Diverticulitis    Elevated blood pressure    Erythema nodosum    Heart murmur    Insomnia    lung ca dx'd 10/1999   chemo/xrt comp 05/29/2000   Myalgia    Nephrolithiasis 2015   Obesity    Pericardial effusion    a. 05/2015 Echo: EF 55-60%, no rwma, Gr 1 DD, no effusion - but an effusion was seen on CT which was felt to be increased in size compared to 12/2014 -  pericardium also thickened.    Pericarditis    a. Dx 05/2015.   Pneumothorax, spontaneous, tension 01/20/2012   October, 2010   Pulmonary fibrosis ([place]) 01/20/2012   Due to previous surgery and chest radiation for lung cancer   Restrictive lung disease    Type II diabetes mellitus [facility])    Past Surgical History:  Procedure Laterality Date   ABDOMINAL HYSTERECTOMY  10/21/2006   APPENDECTOMY  1969?   ATRIAL FIBRILLATION ABLATION N/A 02/27/2022   Procedure: ATRIAL FIBRILLATION ABLATION;  Surgeon: Constance Haw, MD;  Location: [place] [facility];  Service: Cardiovascular;  Laterality: N/A;   BRONCHIAL WASHINGS  08/03/2021   Procedure: BRONCHIAL WASHINGS;  Surgeon: Rigoberto Noel, MD;  Location: [place];  Service: Cardiopulmonary;;   CARDIAC CATHETERIZATION N/A 07/11/2015   Procedure: Left Heart Cath and Coronary Angiography;  Surgeon: Jettie Booze, MD;  Location: [place] [facility];  Service: Cardiovascular;  Laterality: N/A;   CARDIAC CATHETERIZATION  07/11/2015   Procedure: Coronary Stent Intervention;  Surgeon: Jettie Booze, MD;  Location: [place] [facility];  Service: Cardiovascular;;   CARPAL TUNNEL RELEASE Bilateral 1998-2005?   right-left   CARPAL TUNNEL RELEASE Left 2009?   "for the 2nd time"   COLONOSCOPY     COLONOSCOPY WITH PROPOFOL N/A 05/28/2018   Procedure: COLONOSCOPY WITH PROPOFOL;  Surgeon: Milus Banister, MD;  Location: [facility];  Service: Endoscopy;  Laterality: N/A;   CORONARY ANGIOPLASTY     CYSTOSCOPY W/ STONE MANIPULATION  10/22/2011   ESOPHAGOGASTRODUODENOSCOPY (EGD) WITH ESOPHAGEAL DILATION  10/21/2010   FOOT SURGERY Bilateral 06/22/1979   Callous removed    INNER EAR SURGERY Right ~ 2009   KNEE ARTHROPLASTY Right 10/21/1989   KNEE ARTHROSCOPY  ~ 2000   LATERAL RELEASE   LEFT HEART CATH AND CORONARY ANGIOGRAPHY N/A 12/18/2016   Procedure: Left Heart Cath and Coronary Angiography;  Surgeon: Burnell Blanks, MD;  Location:  [place] [facility];  Service: Cardiovascular;  Laterality: N/A;   LEFT HEART CATH AND CORONARY ANGIOGRAPHY N/A 02/01/2021   Procedure: LEFT HEART CATH AND CORONARY ANGIOGRAPHY;  Surgeon: Leonie Man, MD;  Location: [place] [facility];  Service: Cardiovascular;  Laterality: N/A;   LUNG CANCER SURGERY Left 10/22/1999   "small cell"   LUNG SURGERY Right 10/21/2005   "reinflated it"   PULMONARY EMBOLISM SURGERY  10/21/2005   LUNG COLLAPSE   RIGHT AND LEFT HEART CATH N/A 06/25/2017   Procedure: RIGHT AND LEFT HEART CATH;  Surgeon: Sherren Mocha, MD;  Location: [place] [facility];  Service: Cardiovascular;  Laterality: N/A;   SHOULDER ADHESION RELEASE Right ~ 2004   SHOULDER ARTHROSCOPY W/ ROTATOR CUFF REPAIR Right ~ 2003   UPPER GASTROINTESTINAL ENDOSCOPY  06/05/2021   VIDEO BRONCHOSCOPY N/A  08/03/2021   Procedure: VIDEO BRONCHOSCOPY WITHOUT FLUORO;  Surgeon: Rigoberto Noel, MD;  Location: [place] [facility];  Service: Cardiopulmonary;  Laterality: N/A;    Current Outpatient Medications  Medication Sig Dispense Refill   Accu-Chek Softclix Lancets lancets USE LANCETS 3 TIMES A DAY AS DIRECTED 30 DAYS     acetaminophen (TYLENOL) 650 MG CR tablet Take 1,300 mg by mouth every 8 (eight) hours as needed for pain (headache).     apixaban (ELIQUIS) 5 MG TABS tablet TAKE 1 TABLET BY MOUTH TWICE A DAY 180 tablet 2   azaTHIOprine (IMURAN) 50 MG tablet Take 150 mg by mouth every morning.     Blood Glucose Monitoring Suppl (ACCU-CHEK GUIDE) w/Device KIT See admin instructions.     co-enzyme Q-10 50 MG capsule TAKE 1 CAPSULE BY MOUTH EVERY DAY (Patient taking differently: Take 50 mg by mouth every morning.) 30 capsule 7   diltiazem (CARDIZEM CD) 360 MG 24 hr capsule TAKE 1 CAPSULE BY MOUTH EVERY DAY (Patient taking differently: 360 mg every morning.) 90 capsule 3   diphenhydrAMINE (BENADRYL) 25 MG tablet Take 50 mg by mouth at bedtime as needed for allergies.     diphenhydramine-acetaminophen (TYLENOL PM)  25-500 MG TABS tablet Take 2 tablets by mouth at bedtime as needed (sleep).     dofetilide (TIKOSYN) 500 MCG capsule TAKE 1 CAPSULE BY MOUTH 2 TIMES DAILY. 60 capsule 2   folic acid (FOLVITE) 1 MG tablet Take 1 mg by mouth every morning.     furosemide (LASIX) 40 MG tablet Take 1 tablet (40 mg total) by mouth daily as needed for fluid or edema. 30 tablet 6   Insulin Lispro Prot & Lispro (HUMALOG 75/25 MIX) (75-25) 100 UNIT/ML Kwikpen Inject 35-40 Units into the skin See admin instructions. Inject 40 units subcutaneously every morning and 30 units at night     levothyroxine (SYNTHROID, LEVOTHROID) 50 MCG tablet Take 50 mcg by mouth daily before breakfast.   6   losartan (COZAAR) 25 MG tablet TAKE 1 TABLET BY MOUTH EVERY DAY (Patient taking differently: Take 25 mg by mouth every morning.) 90 tablet 3   metoprolol succinate (TOPROL-XL) 50 MG 24 hr tablet Take 1.5 tablets (75 mg total) by mouth at bedtime. Take with or immediately following a meal. 135 tablet 3   nitroGLYCERIN (NITROSTAT) 0.4 MG SL tablet Place 0.4 mg under the tongue every 5 (five) minutes as needed for chest pain.     pravastatin (PRAVACHOL) 20 MG tablet TAKE 1 TABLET BY MOUTH EVERY DAY (Patient taking differently: Take 20 mg by mouth at bedtime.) 90 tablet 3   spironolactone (ALDACTONE) 25 MG tablet Take 1 tablet (25 mg total) by mouth daily. 90 tablet 3   B-D UF III MINI PEN NEEDLES 31G X 5 MM MISC 2 (two) times daily. as directed     OZEMPIC, 0.25 OR 0.5 MG/DOSE, 2 MG/1.5ML SOPN INJECT 0.5 MG INTO THE SKIN ONCE A WEEK. (Patient not taking: Reported on 02/21/2022) 3 pen 3   No current facility-administered medications for this encounter.    Allergies  Allergen Reactions   Hydrocodone Hives and Itching   Morphine And Related Other (See Comments)    GI PROBLEMS   Crestor [Rosuvastatin] Other (See Comments)    Pt reports causes lower extremity muscle aches/cramping   Invokana [Canagliflozin] Diarrhea   Metformin Hcl Diarrhea    Lisinopril Cough   Nsaids Other (See Comments)    GI PROBLEMS    Social History  Socioeconomic History   Marital status: Single    Spouse name: Not on file   Number of children: 0   Years of education: Not on file   Highest education level: Not on file  Occupational History   Occupation: RETIRED  Tobacco Use   Smoking status: Former    Packs/day: 0.25    Years: 5.00    Pack years: 1.25    Types: Cigarettes    Quit date: 07/22/1999    Years since quitting: 22.6   Smokeless tobacco: Never  Vaping Use   Vaping Use: Never used  Substance and Sexual Activity   Alcohol use: No    Alcohol/week: 0.0 standard drinks   Drug use: No   Sexual activity: Yes    Birth control/protection: Other-see comments    Comment: hysterectomy  Other Topics Concern   Not on file  Social History Narrative   Not on file   Social Determinants of Health   Financial Resource Strain: Low Risk    Difficulty of Paying Living Expenses: Not hard at all  Food Insecurity: No Food Insecurity   Worried About Charity fundraiser in the Last Year: Never true   [place] in the Last Year: Never true  Transportation Needs: No Transportation Needs   Lack of Transportation (Medical): No   Lack of Transportation (Non-Medical): No  Physical Activity: Inactive   Days of Exercise per Week: 0 days   Minutes of Exercise per Session: 0 min  Stress: No Stress Concern Present   Feeling of Stress : Not at all  Social Connections: Not on file  Intimate Partner Violence: Not on file     ROS- All systems are reviewed and negative except as per the HPI above.  Physical Exam: Vitals:   03/27/22 1121  BP: (!) 124/56  Pulse: 86  Weight: 106.5 kg  Height: 5' 7.5" (1.715 m)     GEN- The patient is a well appearing obese female, alert and oriented x 3 today.   HEENT-head normocephalic, atraumatic, sclera clear, conjunctiva pink, hearing intact, trachea midline. Lungs- Clear to ausculation bilaterally,  normal work of breathing Heart- Regular rate and rhythm, no rubs or gallops, 3/6 systolic murmur  GI- soft, NT, ND, + BS Extremities- no clubbing, cyanosis, or edema MS- no significant deformity or atrophy Skin- no rash or lesion Psych- euthymic mood, full affect Neuro- strength and sensation are intact   Wt Readings from Last 3 Encounters:  03/27/22 106.5 kg  02/27/22 102.1 kg  02/19/22 106.1 kg    EKG today demonstrates  SR Vent. rate 86 BPM PR interval 142 ms QRS duration 94 ms QT/QTcB 394/471 ms  Echo 11/26/21 demonstrated   1. Left ventricular ejection fraction, by estimation, is 60 to 65%. The  left ventricle has normal function. The left ventricle has no regional  wall motion abnormalities. Left ventricular diastolic parameters are  consistent with Grade I diastolic dysfunction (impaired relaxation).   2. Right ventricular systolic function is normal. The right ventricular  size is normal. There is normal pulmonary artery systolic pressure.   3. The mitral valve is normal in structure. No evidence of mitral valve  regurgitation. No evidence of mitral stenosis.   4. The aortic valve is normal in structure. Aortic valve regurgitation is  moderate. No aortic stenosis is present. Aortic regurgitation PHT measures 345 msec.   5. The inferior vena cava is normal in size with greater than 50%  respiratory variability, suggesting right atrial pressure  of 3 mmHg.   Epic records are reviewed at length today  Assessment and Plan:  1. Paroxysmal atrial fibrillation S/p afib ablation 02/27/22 Patient appears to be maintaining SR. Continue dofetilide 500 mg BID. QT stable. Check magnesium today. Continue diltiazem 360 mg daily  Continue Toprol 75 mg daily Continue Eliquis 5 mg BID with no missed doses for 3 months post ablation.   This patients CHA2DS2-VASc Score and unadjusted Ischemic Stroke Rate (% per year) is equal to 4.8 % stroke rate/year from a score of 4  Above  score calculated as 1 point each if present [CHF, HTN, DM, Vascular=MI/PAD/Aortic Plaque, Age if 65-74, or Female] Above score calculated as 2 points each if present [Age > 75, or Stroke/TIA/TE]  2. Obesity Body mass index is 36.23 kg/m. Lifestyle modification was discussed and encouraged including regular physical activity and weight reduction.  3. CAD CAC score 2774 99th percentile.  Stress test 02/20/22 low risk.  4. HTN Stable, no changes today.   Follow up with Dr Curt Bears as scheduled.    [facility] [address] [place], [place] [address] [phone] 03/27/2022 12:09 PM

## 2022-03-29 ENCOUNTER — Telehealth (HOSPITAL_COMMUNITY): Payer: Self-pay

## 2022-03-29 NOTE — Telephone Encounter (Signed)
Contacted patient on 6/7 to inform her to stop taking Benadryl and Tylenol PM as needed. Benadryl and Tylenol PM are contraindicated with Tikosyn. Consulted with patient and she verbalized understanding.

## 2022-03-31 DIAGNOSIS — Z139 Encounter for screening, unspecified: Secondary | ICD-10-CM

## 2022-03-31 LAB — GLUCOSE, POCT (MANUAL RESULT ENTRY): POC Glucose: 137 mg/dl — AB (ref 70–99)

## 2022-03-31 NOTE — Congregational Nurse Program (Unsigned)
  Dept: [phone]   Congregational Nurse Program Note  Date of Encounter: 03/31/2022  Past Medical History: Past Medical History:  Diagnosis Date   Allergy    Anemia    Anemia    Aortic insufficiency    a. Mod by echo 05/2015.   Arthritis    "knees" (07/11/2015)   Atrial fibrillation (HCC)    On Eliquis   Carcinoma of hilus of lung ([place]) 01/20/2012   IIIB NSCL  Right hilar mass compressing esophagus/presenting with dysphagia Rx Surgery/RT/chemo dx January 2001   Coronary artery disease    a. Abnormal stress test -> LHc 06/2015 s/p DES to mCX and mLAD, residual D2 disease treated medially.   Diverticulitis    Elevated blood pressure    Erythema nodosum    Heart murmur    Insomnia    lung ca dx'd 10/1999   chemo/xrt comp 05/29/2000   Myalgia    Nephrolithiasis 2015   Obesity    Pericardial effusion    a. 05/2015 Echo: EF 55-60%, no rwma, Gr 1 DD, no effusion - but an effusion was seen on CT which was felt to be increased in size compared to 12/2014 - pericardium also thickened.    Pericarditis    a. Dx 05/2015.   Pneumothorax, spontaneous, tension 01/20/2012   October, 2010   Pulmonary fibrosis ([place]) 01/20/2012   Due to previous surgery and chest radiation for lung cancer   Restrictive lung disease    Type II diabetes mellitus ([place])     Encounter Details:  CNP Questionnaire - 03/31/22 1323       Questionnaire   Do you give verbal consent to treat you today? Yes    Location Patient [place] or Organization    Patient Status Unknown    Insurance Medicare    Insurance Referral N/A    Medication N/A    Medical Provider Yes    Screening Referrals N/A    Medical Referral N/A    Medical Appointment Made N/A    Food N/A    Transportation N/A    Housing/Utilities N/A    Interpersonal Safety N/A    Intervention Blood pressure;Educate;Spiritual Care;Support;Counsel    ED Visit Averted N/A    Life-Saving Intervention Made  N/A             Today's Vitals   03/31/22 1322  BP: 128/64  Resp: 18  Temp: 97.6 F (36.4 C)  TempSrc: Temporal  Weight: 230 lb (104.3 kg)   Body mass index is 35.49 kg/m.   Patient seen in clinic. Vitals, blood glucose given, and weight collected. Spiritual care given. Discussed with patient about weight management and weight loss goals.   Andrez Grime, BSN, RN, CRRN,CMSRN

## 2022-04-04 DIAGNOSIS — C3490 Malignant neoplasm of unspecified part of unspecified bronchus or lung: Secondary | ICD-10-CM | POA: Diagnosis not present

## 2022-04-04 DIAGNOSIS — E039 Hypothyroidism, unspecified: Secondary | ICD-10-CM | POA: Diagnosis not present

## 2022-04-04 DIAGNOSIS — E1165 Type 2 diabetes mellitus with hyperglycemia: Secondary | ICD-10-CM | POA: Diagnosis not present

## 2022-04-04 DIAGNOSIS — I5032 Chronic diastolic (congestive) heart failure: Secondary | ICD-10-CM | POA: Diagnosis not present

## 2022-04-04 DIAGNOSIS — Z0000 Encounter for general adult medical examination without abnormal findings: Secondary | ICD-10-CM | POA: Diagnosis not present

## 2022-04-04 DIAGNOSIS — E782 Mixed hyperlipidemia: Secondary | ICD-10-CM | POA: Diagnosis not present

## 2022-04-04 DIAGNOSIS — I480 Paroxysmal atrial fibrillation: Secondary | ICD-10-CM | POA: Diagnosis not present

## 2022-04-04 DIAGNOSIS — I2510 Atherosclerotic heart disease of native coronary artery without angina pectoris: Secondary | ICD-10-CM | POA: Diagnosis not present

## 2022-04-04 DIAGNOSIS — I10 Essential (primary) hypertension: Secondary | ICD-10-CM | POA: Diagnosis not present

## 2022-04-21 ENCOUNTER — Other Ambulatory Visit: Payer: Self-pay | Admitting: Interventional Cardiology

## 2022-05-28 ENCOUNTER — Encounter: Payer: Self-pay | Admitting: Cardiology

## 2022-05-28 ENCOUNTER — Ambulatory Visit (INDEPENDENT_AMBULATORY_CARE_PROVIDER_SITE_OTHER): Payer: Medicare HMO | Admitting: Cardiology

## 2022-05-28 VITALS — BP 124/66 | HR 67 | Ht 67.5 in | Wt 238.8 lb

## 2022-05-28 DIAGNOSIS — T466X5A Adverse effect of antihyperlipidemic and antiarteriosclerotic drugs, initial encounter: Secondary | ICD-10-CM | POA: Diagnosis not present

## 2022-05-28 DIAGNOSIS — I480 Paroxysmal atrial fibrillation: Secondary | ICD-10-CM | POA: Diagnosis not present

## 2022-05-28 DIAGNOSIS — I5032 Chronic diastolic (congestive) heart failure: Secondary | ICD-10-CM

## 2022-05-28 DIAGNOSIS — G720 Drug-induced myopathy: Secondary | ICD-10-CM | POA: Diagnosis not present

## 2022-05-28 NOTE — Progress Notes (Signed)
Electrophysiology Office Note   Date:  05/28/2022   ID:  [name], [name] [birth year], MRN [id number]  PCP:  Mckinley Jewel, MD  Cardiologist:  Tamala Julian Primary Electrophysiologist:  Oretha Weismann Meredith Leeds, MD    Chief Complaint: AF   History of Present Illness: [name] is a 66 y.o. female who is being seen today for the evaluation of AF at the request of Pahwani, Michell Heinrich, MD. Presenting today for electrophysiology evaluation.  She has a history significant for coronary artery disease post circumflex and LAD stents, chronic pericardial effusion with chronic pericarditis, CHF, diabetes, hypertension, non-small cell lung cancer post surgery and chemotherapy, COPD, pulmonary fibrosis, atrial fibrillation.  She was admitted to the hospital April 2020 and was loaded on dofetilide.  She continued to have episodes of atrial fibrillation.  She is now status post ablation 02/27/2022.  Today, denies symptoms of palpitations, chest pain, shortness of breath, orthopnea, PND, lower extremity edema, claudication, dizziness, presyncope, syncope, bleeding, or neurologic sequela. The patient is tolerating medications without difficulties.  Since her ablation she has done well.  She has had no further episodes of atrial fibrillation.  She is overall quite happy with her control.   Past Medical History:  Diagnosis Date   Allergy    Anemia    Anemia    Aortic insufficiency    a. Mod by echo 05/2015.   Arthritis    "knees" (07/11/2015)   Atrial fibrillation (HCC)    On Eliquis   Carcinoma of hilus of lung ([place]) 01/20/2012   IIIB NSCL  Right hilar mass compressing esophagus/presenting with dysphagia Rx Surgery/RT/chemo dx January 2001   Coronary artery disease    a. Abnormal stress test -> LHc 06/2015 s/p DES to mCX and mLAD, residual D2 disease treated medially.   Diverticulitis    Elevated blood pressure    Erythema nodosum    Heart murmur    Insomnia    lung ca dx'd 10/1999   chemo/xrt comp  05/29/2000   Myalgia    Nephrolithiasis 2015   Obesity    Pericardial effusion    a. 05/2015 Echo: EF 55-60%, no rwma, Gr 1 DD, no effusion - but an effusion was seen on CT which was felt to be increased in size compared to 12/2014 - pericardium also thickened.    Pericarditis    a. Dx 05/2015.   Pneumothorax, spontaneous, tension 01/20/2012   October, 2010   Pulmonary fibrosis ([place]) 01/20/2012   Due to previous surgery and chest radiation for lung cancer   Restrictive lung disease    Type II diabetes mellitus [facility])    Past Surgical History:  Procedure Laterality Date   ABDOMINAL HYSTERECTOMY  10/21/2006   APPENDECTOMY  1969?   ATRIAL FIBRILLATION ABLATION N/A 02/27/2022   Procedure: ATRIAL FIBRILLATION ABLATION;  Surgeon: Constance Haw, MD;  Location: [place] [facility];  Service: Cardiovascular;  Laterality: N/A;   BRONCHIAL WASHINGS  08/03/2021   Procedure: BRONCHIAL WASHINGS;  Surgeon: Rigoberto Noel, MD;  Location: [place];  Service: Cardiopulmonary;;   CARDIAC CATHETERIZATION N/A 07/11/2015   Procedure: Left Heart Cath and Coronary Angiography;  Surgeon: Jettie Booze, MD;  Location: [place] [facility];  Service: Cardiovascular;  Laterality: N/A;   CARDIAC CATHETERIZATION  07/11/2015   Procedure: Coronary Stent Intervention;  Surgeon: Jettie Booze, MD;  Location: [place] [facility];  Service: Cardiovascular;;   CARPAL TUNNEL RELEASE Bilateral 1998-2005?   right-left   CARPAL TUNNEL  RELEASE Left 2009?   "for the 2nd time"   COLONOSCOPY     COLONOSCOPY WITH PROPOFOL N/A 05/28/2018   Procedure: COLONOSCOPY WITH PROPOFOL;  Surgeon: Milus Banister, MD;  Location: [facility];  Service: Endoscopy;  Laterality: N/A;   CORONARY ANGIOPLASTY     CYSTOSCOPY W/ STONE MANIPULATION  10/22/2011   ESOPHAGOGASTRODUODENOSCOPY (EGD) WITH ESOPHAGEAL DILATION  10/21/2010   FOOT SURGERY Bilateral 06/22/1979   Callous removed    INNER EAR SURGERY Right ~ 2009   KNEE  ARTHROPLASTY Right 10/21/1989   KNEE ARTHROSCOPY  ~ 2000   LATERAL RELEASE   LEFT HEART CATH AND CORONARY ANGIOGRAPHY N/A 12/18/2016   Procedure: Left Heart Cath and Coronary Angiography;  Surgeon: Burnell Blanks, MD;  Location: [place] [facility];  Service: Cardiovascular;  Laterality: N/A;   LEFT HEART CATH AND CORONARY ANGIOGRAPHY N/A 02/01/2021   Procedure: LEFT HEART CATH AND CORONARY ANGIOGRAPHY;  Surgeon: Leonie Man, MD;  Location: [place] [facility];  Service: Cardiovascular;  Laterality: N/A;   LUNG CANCER SURGERY Left 10/22/1999   "small cell"   LUNG SURGERY Right 10/21/2005   "reinflated it"   PULMONARY EMBOLISM SURGERY  10/21/2005   LUNG COLLAPSE   RIGHT AND LEFT HEART CATH N/A 06/25/2017   Procedure: RIGHT AND LEFT HEART CATH;  Surgeon: Sherren Mocha, MD;  Location: [place] [facility];  Service: Cardiovascular;  Laterality: N/A;   SHOULDER ADHESION RELEASE Right ~ 2004   SHOULDER ARTHROSCOPY W/ ROTATOR CUFF REPAIR Right ~ 2003   UPPER GASTROINTESTINAL ENDOSCOPY  06/05/2021   VIDEO BRONCHOSCOPY N/A 08/03/2021   Procedure: VIDEO BRONCHOSCOPY WITHOUT FLUORO;  Surgeon: Rigoberto Noel, MD;  Location: [place];  Service: Cardiopulmonary;  Laterality: N/A;     Current Outpatient Medications  Medication Sig Dispense Refill   ACCU-CHEK GUIDE test strip      Accu-Chek Softclix Lancets lancets USE LANCETS 3 TIMES A DAY AS DIRECTED 30 DAYS     acetaminophen (TYLENOL) 650 MG CR tablet Take 1,300 mg by mouth every 8 (eight) hours as needed for pain (headache).     apixaban (ELIQUIS) 5 MG TABS tablet TAKE 1 TABLET BY MOUTH TWICE A DAY 180 tablet 2   azaTHIOprine (IMURAN) 50 MG tablet Take 150 mg by mouth every morning.     B-D UF III MINI PEN NEEDLES 31G X 5 MM MISC 2 (two) times daily. as directed     Blood Glucose Monitoring Suppl (ACCU-CHEK GUIDE) w/Device KIT See admin instructions.     co-enzyme Q-10 50 MG capsule TAKE 1 CAPSULE BY MOUTH EVERY DAY (Patient  taking differently: Take 50 mg by mouth every morning.) 30 capsule 7   diltiazem (CARDIZEM CD) 360 MG 24 hr capsule TAKE 1 CAPSULE BY MOUTH EVERY DAY (Patient taking differently: 360 mg every morning.) 90 capsule 3   folic acid (FOLVITE) 1 MG tablet Take 1 mg by mouth every morning.     furosemide (LASIX) 40 MG tablet Take 1 tablet (40 mg total) by mouth daily as needed for fluid or edema. 30 tablet 6   Insulin Lispro Prot & Lispro (HUMALOG 75/25 MIX) (75-25) 100 UNIT/ML Kwikpen Inject 35-40 Units into the skin See admin instructions. Inject 40 units subcutaneously every morning and 30 units at night     levothyroxine (SYNTHROID, LEVOTHROID) 50 MCG tablet Take 50 mcg by mouth daily before breakfast.   6   losartan (COZAAR) 25 MG tablet TAKE 1 TABLET BY MOUTH EVERY DAY (Patient taking  differently: Take 25 mg by mouth every morning.) 90 tablet 3   metoprolol succinate (TOPROL-XL) 50 MG 24 hr tablet Take 1.5 tablets (75 mg total) by mouth at bedtime. Take with or immediately following a meal. 135 tablet 3   nitroGLYCERIN (NITROSTAT) 0.4 MG SL tablet Place 0.4 mg under the tongue every 5 (five) minutes as needed for chest pain.     nitroGLYCERIN (NITROSTAT) 0.4 MG SL tablet See admin instructions.     NOVOLOG MIX 70/30 FLEXPEN (70-30) 100 UNIT/ML FlexPen Inject into the skin.     OZEMPIC, 0.25 OR 0.5 MG/DOSE, 2 MG/1.5ML SOPN INJECT 0.5 MG INTO THE SKIN ONCE A WEEK. 3 pen 3   pravastatin (PRAVACHOL) 20 MG tablet TAKE 1 TABLET BY MOUTH EVERY DAY (Patient taking differently: Take 20 mg by mouth at bedtime.) 90 tablet 3   spironolactone (ALDACTONE) 25 MG tablet Take 1 tablet (25 mg total) by mouth daily. 90 tablet 3   No current facility-administered medications for this visit.    Allergies:   Hydrocodone, Morphine and related, Crestor [rosuvastatin], Invokana [canagliflozin], Metformin hcl, Lisinopril, and Nsaids   Social History:  The patient  reports that she quit smoking about 22 years ago. Her  smoking use included cigarettes. She has a 1.25 pack-year smoking history. She has never used smokeless tobacco. She reports that she does not drink alcohol and does not use drugs.   Family History:  The patient's family history includes Diabetes in her father, maternal grandmother, mother, paternal grandmother, sister, sister, sister, and sister; Heart disease in her father; Hypertension in her sister; Kidney disease in her sister; Pancreatic cancer in her mother.   ROS:  Please see the history of present illness.   Otherwise, review of systems is positive for none.   All other systems are reviewed and negative.   PHYSICAL EXAM: VS:  BP 124/66   Pulse 67   Ht 5' 7.5" (1.715 m)   Wt 238 lb 12.8 oz (108.3 kg)   SpO2 94%   BMI 36.85 kg/m  , BMI Body mass index is 36.85 kg/m. GEN: Well nourished, well developed, in no acute distress  HEENT: normal  Neck: no JVD, carotid bruits, or masses Cardiac: RRR; 2 out of 6 decrescendo aortic regurgitation murmur  respiratory:  clear to auscultation bilaterally, normal work of breathing GI: soft, nontender, nondistended, + BS MS: no deformity or atrophy  Skin: warm and dry Neuro:  Strength and sensation are intact Psych: euthymic mood, full affect  EKG:  EKG is ordered today. Personal review of the ekg ordered shows sinus rhythm, rate 67  Recent Labs: 11/27/2021: BUN 18; Creatinine, Ser 1.00; Hemoglobin 10.9; Platelets 310; Potassium 4.3; Sodium 141; TSH 1.915 03/27/2022: Magnesium 2.0    Lipid Panel     Component Value Date/Time   CHOL 127 11/27/2021 0408   CHOL 130 04/25/2021 1614   TRIG 72 11/27/2021 0408   HDL 44 11/27/2021 0408   HDL 44 04/25/2021 1614   CHOLHDL 2.9 11/27/2021 0408   VLDL 14 11/27/2021 0408   LDLCALC 69 11/27/2021 0408   LDLCALC 67 04/25/2021 1614     Wt Readings from Last 3 Encounters:  05/28/22 238 lb 12.8 oz (108.3 kg)  03/31/22 230 lb (104.3 kg)  03/27/22 234 lb 12.8 oz (106.5 kg)      Other studies  Reviewed: Additional studies/ records that were reviewed today include: TTE 11/26/2021 Review of the above records today demonstrates:   1. Left ventricular ejection fraction, by  estimation, is 60 to 65%. The  left ventricle has normal function. The left ventricle has no regional  wall motion abnormalities. Left ventricular diastolic parameters are  consistent with Grade I diastolic  dysfunction (impaired relaxation).   2. Right ventricular systolic function is normal. The right ventricular  size is normal. There is normal pulmonary artery systolic pressure.   3. The mitral valve is normal in structure. No evidence of mitral valve  regurgitation. No evidence of mitral stenosis.   4. The aortic valve is normal in structure. Aortic valve regurgitation is  moderate. No aortic stenosis is present. Aortic regurgitation PHT measures  345 msec.   5. The inferior vena cava is normal in size with greater than 50%  respiratory variability, suggesting right atrial pressure of 3 mmHg.    ASSESSMENT AND PLAN:  1.  Paroxysmal atrial fibrillation: Currently on dofetilide 500 mcg twice daily, Eliquis 5 mg twice daily.  High risk medication monitoring for dofetilide.  CHA2DS2-VASc of 4.  She is status post ablation 02/27/2022.  At this point, she would like to stop her Tikosyn.  She has remained in normal rhythm.  She understands that if she needs to restart it, she Madisson Kulaga require rehospitalization.  2.  Recurrent pericarditis: Followed by general cardiology  3.  Chronic diastolic heart failure: No obvious volume overload  4.  Coronary artery disease: No current chest pain.  Plan per primary cardiology  5.  Secondary hypercoagulable state: Currently on Eliquis for atrial fibrillation as above   Current medicines are reviewed at length with the patient today.   The patient does not have concerns regarding her medicines.  The following changes were made today: Stop dofetilide  Labs/ tests ordered today  include:  Orders Placed This Encounter  Procedures   EKG 12-Lead     Disposition:   FU 6 months  Signed, Jhalen Eley Meredith Leeds, MD  05/28/2022 12:07 PM     [place] [place]  [address] [phone] (office) [phone] (fax)

## 2022-05-28 NOTE — Patient Instructions (Addendum)
Medication Instructions:  Your physician has recommended you make the following change in your medication:    STOP Tikosyn (dofetilide)  Labwork: None ordered.  Testing/Procedures: None ordered.  Follow-Up: Your physician wants you to follow-up in: 6 months with AFib clinic.   Any Other Special Instructions Will Be Listed Below (If Applicable).  If you need a refill on your cardiac medications before your next appointment, please call your pharmacy.   Important Information About Sugar

## 2022-06-11 DIAGNOSIS — H15002 Unspecified scleritis, left eye: Secondary | ICD-10-CM | POA: Diagnosis not present

## 2022-06-11 DIAGNOSIS — Z7985 Long-term (current) use of injectable non-insulin antidiabetic drugs: Secondary | ICD-10-CM | POA: Diagnosis not present

## 2022-06-11 DIAGNOSIS — H15102 Unspecified episcleritis, left eye: Secondary | ICD-10-CM | POA: Diagnosis not present

## 2022-06-11 DIAGNOSIS — H2513 Age-related nuclear cataract, bilateral: Secondary | ICD-10-CM | POA: Diagnosis not present

## 2022-06-11 DIAGNOSIS — E113293 Type 2 diabetes mellitus with mild nonproliferative diabetic retinopathy without macular edema, bilateral: Secondary | ICD-10-CM | POA: Diagnosis not present

## 2022-06-11 DIAGNOSIS — H35033 Hypertensive retinopathy, bilateral: Secondary | ICD-10-CM | POA: Diagnosis not present

## 2022-06-11 DIAGNOSIS — Z794 Long term (current) use of insulin: Secondary | ICD-10-CM | POA: Diagnosis not present

## 2022-06-11 DIAGNOSIS — Z79899 Other long term (current) drug therapy: Secondary | ICD-10-CM | POA: Diagnosis not present

## 2022-06-13 ENCOUNTER — Ambulatory Visit (HOSPITAL_COMMUNITY)
Admission: EM | Admit: 2022-06-13 | Discharge: 2022-06-13 | Disposition: A | Discharge status: Home / Self Care | Payer: Medicare HMO | Attending: Physician Assistant | Admitting: Physician Assistant

## 2022-06-13 ENCOUNTER — Encounter (HOSPITAL_COMMUNITY): Payer: Self-pay

## 2022-06-13 DIAGNOSIS — R197 Diarrhea, unspecified: Secondary | ICD-10-CM | POA: Insufficient documentation

## 2022-06-13 DIAGNOSIS — R112 Nausea with vomiting, unspecified: Secondary | ICD-10-CM | POA: Diagnosis not present

## 2022-06-13 DIAGNOSIS — N3090 Cystitis, unspecified without hematuria: Secondary | ICD-10-CM | POA: Diagnosis not present

## 2022-06-13 DIAGNOSIS — R1011 Right upper quadrant pain: Secondary | ICD-10-CM | POA: Diagnosis not present

## 2022-06-13 LAB — POCT URINALYSIS DIPSTICK, ED / UC
Glucose, UA: NEGATIVE mg/dL
Nitrite: POSITIVE — AB
Protein, ur: 30 mg/dL — AB
Specific Gravity, Urine: >=1.03 (ref 1.005–1.030)
Urobilinogen, UA: 1 mg/dL (ref 0.0–1.0)
pH: 5.5 (ref 5.0–8.0)

## 2022-06-13 LAB — CBC WITH DIFFERENTIAL/PLATELET
Abs Immature Granulocytes: 0.01 10*3/uL (ref 0.00–0.07)
Basophils Absolute: 0 10*3/uL (ref 0.0–0.1)
Basophils Relative: 1 %
Eosinophils Absolute: 0.1 10*3/uL (ref 0.0–0.5)
Eosinophils Relative: 2 %
HCT: 36.3 % (ref 36.0–46.0)
Hemoglobin: 12.1 g/dL (ref 12.0–15.0)
Immature Granulocytes: 0 %
Lymphocytes Relative: 22 %
Lymphs Abs: 1.3 10*3/uL (ref 0.7–4.0)
MCH: 31.8 pg (ref 26.0–34.0)
MCHC: 33.3 g/dL (ref 30.0–36.0)
MCV: 95.3 fL (ref 80.0–100.0)
Monocytes Absolute: 0.5 10*3/uL (ref 0.1–1.0)
Monocytes Relative: 9 %
Neutro Abs: 3.9 10*3/uL (ref 1.7–7.7)
Neutrophils Relative %: 66 %
Platelets: 292 10*3/uL (ref 150–400)
RBC: 3.81 MIL/uL — ABNORMAL LOW (ref 3.87–5.11)
RDW: 11.7 % (ref 11.5–15.5)
WBC: 5.9 10*3/uL (ref 4.0–10.5)
nRBC: 0 % (ref 0.0–0.2)

## 2022-06-13 MED ORDER — ONDANSETRON 4 MG PO TBDP
4.0000 mg | ORAL_TABLET | Freq: Once | ORAL | Status: AC
  Administered 2022-06-13: 4 mg via ORAL

## 2022-06-13 MED ORDER — NITROFURANTOIN MONOHYD MACRO 100 MG PO CAPS: 100.0000 mg | ORAL_CAPSULE | Freq: Two times a day (BID) | ORAL | 0 refills | Status: DC

## 2022-06-13 MED ORDER — ONDANSETRON HCL 4 MG PO TABS: 4.0000 mg | ORAL_TABLET | Freq: Three times a day (TID) | ORAL | 0 refills | Status: DC | PRN

## 2022-06-13 MED ORDER — ONDANSETRON 4 MG PO TBDP
ORAL_TABLET | ORAL | Status: AC
  Filled 2022-06-13: qty 1

## 2022-06-13 NOTE — Discharge Instructions (Addendum)
Advised to take Zofran every 6 hours to control the nausea and vomiting. Advise if you continue with the right upper quadrant pain along with nausea and vomiting even though you are taking the Zofran and you feel like your symptoms or not improving over the next 48 hours then it is advised that she report to the emergency room for evaluation of possible gallbladder problems. Advised to continue clear liquids, chicken broth, chicken noodle soup and then move into a bland diet if the nausea and vomiting stops.

## 2022-06-13 NOTE — ED Triage Notes (Signed)
Pt is here for abdominal pain, vomiting x5days, pt denies fever and diarrhea

## 2022-06-13 NOTE — ED Provider Notes (Signed)
[place]    CSN: [id number] Arrival date & time: 06/13/22  1424      History   Chief Complaint Chief Complaint  Patient presents with   Emesis    HPI [name] is a 66 y.o. female.   66 year old female presents with nausea and vomiting.  Patient relates Saturday evening she ate some fried shrimp, baked potato, and then Sunday morning started having abdominal pain with nausea and vomiting.  Patient indicates her husband ate the same food but did not get sick.  She indicates that she has continued intermittently with nausea and vomiting for the past 5 days, and just yesterday started with some loose bowels.  Patient relates that her pain is mainly in the right upper quadrant, steady and localized.  Patient denies fever, chills, and no relief with OTC medicines.  Patient has not been around any family or friends that have similar symptoms.   Emesis Associated symptoms: abdominal pain (right upper abdomen)     Past Medical History:  Diagnosis Date   Allergy    Anemia    Anemia    Aortic insufficiency    a. Mod by echo 05/2015.   Arthritis    "knees" (07/11/2015)   Atrial fibrillation (HCC)    On Eliquis   Carcinoma of hilus of lung ([place]) 01/20/2012   IIIB NSCL  Right hilar mass compressing esophagus/presenting with dysphagia Rx Surgery/RT/chemo dx January 2001   Coronary artery disease    a. Abnormal stress test -> LHc 06/2015 s/p DES to mCX and mLAD, residual D2 disease treated medially.   Diverticulitis    Elevated blood pressure    Erythema nodosum    Heart murmur    Insomnia    lung ca dx'd 10/1999   chemo/xrt comp 05/29/2000   Myalgia    Nephrolithiasis 2015   Obesity    Pericardial effusion    a. 05/2015 Echo: EF 55-60%, no rwma, Gr 1 DD, no effusion - but an effusion was seen on CT which was felt to be increased in size compared to 12/2014 - pericardium also thickened.    Pericarditis    a. Dx 05/2015.   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   Type 2 diabetes mellitus with stage 3 chronic kidney disease, with long-term current use of insulin ([place]) 07/17/2020   PVD (peripheral vascular disease) ([place]) 07/06/2020   Daytime sleepiness 02/14/2020   Paroxysmal atrial fibrillation ([place]) 01/25/2020   Secondary hypercoagulable state ([place]) 01/25/2020   Hypothyroidism 02/17/2019   Cellulitis of fourth toe, left 01/01/2019   Diverticulosis of colon without hemorrhage    Chronic diastolic (congestive) heart failure ([place]) 08/04/2017   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   Chronic pericarditis 06/13/2017   Unstable angina ([place]) 04/12/2017   Atrial fibrillation with RVR ([place])    Hyperlipidemia 08/07/2015   CAD S/P percutaneous coronary angioplasty 07/25/2015   Hypertension, essential    Aortic insufficiency  Stented coronary artery    Abnormal nuclear stress test 07/11/2015   Abnormal stress test 07/04/2015   Pericarditis    Controlled type 2 diabetes mellitus without complication, with long-term current use of insulin (HCC)    Pericardial effusion    Chest pain 05/30/2015   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   ATRIAL FIBRILLATION ABLATION N/A 02/27/2022   Procedure: ATRIAL FIBRILLATION ABLATION;  Surgeon: Constance Haw, MD;  Location: [place] [facility];  Service: Cardiovascular;  Laterality: N/A;   BRONCHIAL WASHINGS  08/03/2021   Procedure: BRONCHIAL WASHINGS;  Surgeon: Rigoberto Noel, MD;  Location: [place];  Service: Cardiopulmonary;;   CARDIAC CATHETERIZATION N/A 07/11/2015   Procedure: Left Heart Cath and Coronary Angiography;  Surgeon: Jettie Booze, MD;  Location: [place] [facility];  Service: Cardiovascular;  Laterality: N/A;   CARDIAC CATHETERIZATION  07/11/2015   Procedure: Coronary Stent Intervention;  Surgeon: Jettie Booze, MD;  Location: [place] [facility];  Service: Cardiovascular;;   CARPAL TUNNEL RELEASE Bilateral 1998-2005?   right-left   CARPAL TUNNEL RELEASE Left 2009?   "for the 2nd time"   COLONOSCOPY     COLONOSCOPY WITH PROPOFOL N/A 05/28/2018   Procedure: COLONOSCOPY WITH PROPOFOL;  Surgeon: Milus Banister, MD;  Location: [facility];  Service: Endoscopy;  Laterality: N/A;   CORONARY ANGIOPLASTY     CYSTOSCOPY W/ STONE MANIPULATION  10/22/2011   ESOPHAGOGASTRODUODENOSCOPY (EGD) WITH ESOPHAGEAL DILATION  10/21/2010   FOOT SURGERY Bilateral 06/22/1979   Callous removed    INNER EAR SURGERY Right ~ 2009   KNEE ARTHROPLASTY Right 10/21/1989   KNEE ARTHROSCOPY  ~ 2000   LATERAL RELEASE   LEFT HEART CATH AND CORONARY ANGIOGRAPHY N/A 12/18/2016   Procedure: Left Heart Cath and Coronary Angiography;  Surgeon: Burnell Blanks, MD;  Location: [place] [facility];  Service: Cardiovascular;  Laterality: N/A;   LEFT HEART CATH  AND CORONARY ANGIOGRAPHY N/A 02/01/2021   Procedure: LEFT HEART CATH AND CORONARY ANGIOGRAPHY;  Surgeon: Leonie Man, MD;  Location: [place] [facility];  Service: Cardiovascular;  Laterality: N/A;   LUNG CANCER SURGERY Left 10/22/1999   "small cell"   LUNG SURGERY Right 10/21/2005   "reinflated it"   PULMONARY EMBOLISM SURGERY  10/21/2005   LUNG COLLAPSE   RIGHT AND LEFT HEART CATH N/A 06/25/2017   Procedure: RIGHT AND LEFT HEART CATH;  Surgeon: Sherren Mocha, MD;  Location: [place] [facility];  Service: Cardiovascular;  Laterality: N/A;   SHOULDER ADHESION RELEASE Right ~ 2004   SHOULDER ARTHROSCOPY W/ ROTATOR CUFF REPAIR Right ~ 2003   UPPER GASTROINTESTINAL ENDOSCOPY  06/05/2021   VIDEO BRONCHOSCOPY N/A 08/03/2021   Procedure: VIDEO BRONCHOSCOPY WITHOUT FLUORO;  Surgeon: Rigoberto Noel, MD;  Location: [facility];  Service: Cardiopulmonary;  Laterality: N/A;    OB History   No obstetric history on file.  Home Medications    Prior to Admission medications   Medication Sig Start Date End Date Taking? Authorizing Provider  nitrofurantoin, macrocrystal-monohydrate, (MACROBID) 100 MG capsule Take 1 capsule (100 mg total) by mouth 2 (two) times daily. 06/13/22  Yes Nyoka Lint, PA-C  ondansetron (ZOFRAN) 4 MG tablet Take 1 tablet (4 mg total) by mouth every 8 (eight) hours as needed for nausea or vomiting. 06/13/22  Yes Nyoka Lint, PA-C  ACCU-CHEK GUIDE test strip  05/23/22   [provider]  Accu-Chek Softclix Lancets lancets USE LANCETS 3 TIMES A DAY AS DIRECTED 30 DAYS 11/12/19   [provider]  acetaminophen (TYLENOL) 650 MG CR tablet Take 1,300 mg by mouth every 8 (eight) hours as needed for pain (headache).    [provider]  apixaban (ELIQUIS) 5 MG TABS tablet TAKE 1 TABLET BY MOUTH TWICE A DAY 01/30/22   Camnitz, Ocie Doyne, MD  azaTHIOprine (IMURAN) 50 MG tablet Take 150 mg by mouth every morning. 07/04/20   [provider]  B-D  UF III MINI PEN NEEDLES 31G X 5 MM MISC 2 (two) times daily. as directed 10/31/18   [provider]  Blood Glucose Monitoring Suppl (ACCU-CHEK GUIDE) w/Device KIT See admin instructions. 11/12/19   [provider]  co-enzyme Q-10 50 MG capsule TAKE 1 CAPSULE BY MOUTH EVERY DAY Patient taking differently: Take 50 mg by mouth every morning. 11/30/19   Dorothy Spark, MD  diltiazem (CARDIZEM CD) 360 MG 24 hr capsule TAKE 1 CAPSULE BY MOUTH EVERY DAY Patient taking differently: 360 mg every morning. 06/19/21   Camnitz, Ocie Doyne, MD  folic acid (FOLVITE) 1 MG tablet Take 1 mg by mouth every morning. 12/02/19   [provider]  furosemide (LASIX) 40 MG tablet Take 1 tablet (40 mg total) by mouth daily as needed for fluid or edema. 03/15/21   Belva Crome, MD  Insulin Lispro Prot & Lispro (HUMALOG 75/25 MIX) (75-25) 100 UNIT/ML Kwikpen Inject 35-40 Units into the skin See admin instructions. Inject 40 units subcutaneously every morning and 30 units at night    [provider]  levothyroxine (SYNTHROID, LEVOTHROID) 50 MCG tablet Take 50 mcg by mouth daily before breakfast.  05/14/17   [provider]  losartan (COZAAR) 25 MG tablet TAKE 1 TABLET BY MOUTH EVERY DAY Patient taking differently: Take 25 mg by mouth every morning. 11/20/21   Belva Crome, MD  metoprolol succinate (TOPROL-XL) 50 MG 24 hr tablet Take 1.5 tablets (75 mg total) by mouth at bedtime. Take with or immediately following a meal. 11/20/21   Belva Crome, MD  nitroGLYCERIN (NITROSTAT) 0.4 MG SL tablet Place 0.4 mg under the tongue every 5 (five) minutes as needed for chest pain.    [provider]  nitroGLYCERIN (NITROSTAT) 0.4 MG SL tablet See admin instructions.    [provider]  NOVOLOG MIX 70/30 FLEXPEN (70-30) 100 UNIT/ML FlexPen Inject into the skin. 04/22/22   [provider]  OZEMPIC, 0.25 OR 0.5 MG/DOSE, 2 MG/1.5ML SOPN INJECT 0.5 MG INTO THE SKIN ONCE A  WEEK. 03/30/19   Glendale Chard, MD  pravastatin (PRAVACHOL) 20 MG tablet TAKE 1 TABLET BY MOUTH EVERY DAY Patient taking differently: Take 20 mg by mouth at bedtime. 11/20/21   Belva Crome, MD  spironolactone (ALDACTONE) 25 MG tablet Take 1 tablet (25 mg total) by mouth daily. 06/18/21   Shirley Friar, PA-C    Family History Family History  Problem  Relation Age of Onset   Heart disease Father    Diabetes Father    Pancreatic cancer Mother    Diabetes Mother    Hypertension Sister        x 3   Diabetes Sister        x 4   Diabetes Maternal Grandmother    Diabetes Paternal Grandmother    Diabetes Sister    Diabetes Sister    Diabetes Sister    Kidney disease Sister    Colon cancer Neg Hx    Colon polyps Neg Hx    Rectal cancer Neg Hx    Stomach cancer Neg Hx    Esophageal cancer Neg Hx    Breast cancer Neg Hx     Social History Social History   Tobacco Use   Smoking status: Former    Packs/day: 0.25    Years: 5.00    Total pack years: 1.25    Types: Cigarettes    Quit date: 07/22/1999    Years since quitting: 22.9   Smokeless tobacco: Never  Vaping Use   Vaping Use: Never used  Substance Use Topics   Alcohol use: No    Alcohol/week: 0.0 standard drinks of alcohol   Drug use: No     Allergies   Hydrocodone, Morphine and related, Crestor [rosuvastatin], Invokana [canagliflozin], Metformin hcl, Lisinopril, and Nsaids   Review of Systems Review of Systems  Gastrointestinal:  Positive for abdominal pain (right upper abdomen) and vomiting.     Physical Exam Triage Vital Signs ED Triage Vitals [06/13/22 1447]  Enc Vitals Group     BP 132/80     Pulse Rate 98     Resp 12     Temp 98.3 F (36.8 C)     Temp Source Oral     SpO2 98 %     Weight      Height      Head Circumference      Peak Flow      Pain Score 0     Pain Loc      Pain Edu?      Excl. in [place]?    No data found.  Updated Vital Signs BP 132/80 (BP Location: Left Arm)    Pulse 98   Temp 98.3 F (36.8 C) (Oral)   Resp 12   SpO2 98%   Visual Acuity Right Eye Distance:   Left Eye Distance:   Bilateral Distance:    Right Eye Near:   Left Eye Near:    Bilateral Near:     Physical Exam Constitutional:      Appearance: Normal appearance.  Cardiovascular:     Rate and Rhythm: Normal rate and regular rhythm.     Heart sounds: Normal heart sounds.  Pulmonary:     Effort: Pulmonary effort is normal.     Breath sounds: Normal breath sounds and air entry. No wheezing, rhonchi or rales.  Abdominal:     General: Abdomen is flat. Bowel sounds are normal.     Palpations: Abdomen is soft.     Tenderness: There is abdominal tenderness in the right upper quadrant. Positive signs include Murphy's sign.  Neurological:     Mental Status: She is alert.      UC Treatments / Results  Labs (all labs ordered are listed, but only abnormal results are displayed) Labs Reviewed  POCT URINALYSIS DIPSTICK, ED / UC - Abnormal; Notable for the following components:      Result  Value   Bilirubin Urine SMALL (*)    Ketones, ur TRACE (*)    Hgb urine dipstick MODERATE (*)    Protein, ur 30 (*)    Nitrite POSITIVE (*)    Leukocytes,Ua SMALL (*)    All other components within normal limits  URINE CULTURE  CBC WITH DIFFERENTIAL/PLATELET    EKG   Radiology No results found.  Procedures Procedures (including critical care time)  Medications Ordered in UC Medications  ondansetron (ZOFRAN-ODT) disintegrating tablet 4 mg (has no administration in time range)    Initial Impression / Assessment and Plan / UC Course  I have reviewed the triage vital signs and the nursing notes.  Pertinent labs & imaging results that were available during my care of the patient were reviewed by me and considered in my medical decision making (see chart for details).    Plan: 1.  Patient advised to take the Zofran every 6 hours to help control the nausea and vomiting. 2.   Patient advised if her symptoms do not improve in the next 48 hours and she continues to have abdominal pain with nausea and vomiting she is to report to the emergency room for evaluation of possible cholecystitis. 3.  Advised to follow-up with PCP or return to urgent care as needed. 4.  CBC is pending 5.  Advised take the Macrobid twice daily until completed to treat the cystitis. 6.  Urine culture is pending Final Clinical Impressions(s) / UC Diagnoses   Final diagnoses:  Nausea vomiting and diarrhea  Right upper quadrant abdominal pain  Cystitis     Discharge Instructions      Advised to take Zofran every 6 hours to control the nausea and vomiting. Advise if you continue with the right upper quadrant pain along with nausea and vomiting even though you are taking the Zofran and you feel like your symptoms or not improving over the next 48 hours then it is advised that she report to the emergency room for evaluation of possible gallbladder problems. Advised to continue clear liquids, chicken broth, chicken noodle soup and then move into a bland diet if the nausea and vomiting stops.    ED Prescriptions     Medication Sig Dispense Auth. Provider   ondansetron (ZOFRAN) 4 MG tablet Take 1 tablet (4 mg total) by mouth every 8 (eight) hours as needed for nausea or vomiting. 20 tablet Nyoka Lint, PA-C   nitrofurantoin, macrocrystal-monohydrate, (MACROBID) 100 MG capsule Take 1 capsule (100 mg total) by mouth 2 (two) times daily. 10 capsule Nyoka Lint, PA-C      PDMP not reviewed this encounter.   Nyoka Lint, PA-C 06/13/22 1546

## 2022-06-15 LAB — URINE CULTURE: Culture: >=100000 — AB

## 2022-06-18 ENCOUNTER — Ambulatory Visit
Admission: RE | Admit: 2022-06-18 | Discharge: 2022-06-18 | Disposition: A | Discharge status: Home / Self Care | Payer: Medicare HMO | Source: Ambulatory Visit | Attending: Internal Medicine | Admitting: Internal Medicine

## 2022-06-18 ENCOUNTER — Other Ambulatory Visit: Payer: Self-pay | Admitting: Internal Medicine

## 2022-06-18 DIAGNOSIS — R1011 Right upper quadrant pain: Secondary | ICD-10-CM | POA: Diagnosis not present

## 2022-06-18 DIAGNOSIS — R112 Nausea with vomiting, unspecified: Secondary | ICD-10-CM | POA: Diagnosis not present

## 2022-06-18 DIAGNOSIS — N390 Urinary tract infection, site not specified: Secondary | ICD-10-CM | POA: Diagnosis not present

## 2022-06-18 DIAGNOSIS — B9620 Unspecified Escherichia coli [E. coli] as the cause of diseases classified elsewhere: Secondary | ICD-10-CM | POA: Diagnosis not present

## 2022-07-05 ENCOUNTER — Ambulatory Visit (HOSPITAL_COMMUNITY)
Admission: RE | Admit: 2022-07-05 | Discharge: 2022-07-05 | Disposition: A | Discharge status: Home / Self Care | Payer: Medicare HMO | Source: Ambulatory Visit | Attending: Pulmonary Disease | Admitting: Pulmonary Disease

## 2022-07-05 DIAGNOSIS — Z85110 Personal history of malignant carcinoid tumor of bronchus and lung: Secondary | ICD-10-CM | POA: Diagnosis not present

## 2022-07-05 DIAGNOSIS — I3139 Other pericardial effusion (noninflammatory): Secondary | ICD-10-CM | POA: Diagnosis not present

## 2022-07-05 DIAGNOSIS — J439 Emphysema, unspecified: Secondary | ICD-10-CM | POA: Diagnosis not present

## 2022-07-05 DIAGNOSIS — R0602 Shortness of breath: Secondary | ICD-10-CM

## 2022-07-05 DIAGNOSIS — J479 Bronchiectasis, uncomplicated: Secondary | ICD-10-CM | POA: Diagnosis not present

## 2022-07-10 ENCOUNTER — Telehealth: Payer: Self-pay | Admitting: Interventional Cardiology

## 2022-07-10 DIAGNOSIS — I3139 Other pericardial effusion (noninflammatory): Secondary | ICD-10-CM

## 2022-07-10 DIAGNOSIS — I351 Nonrheumatic aortic (valve) insufficiency: Secondary | ICD-10-CM

## 2022-07-10 NOTE — Telephone Encounter (Signed)
Pt is calling in regards to results given from her pulmonologist in regards to her CT results. She states that they had mentioned fluid build-up around her heart and she would like to know if she should come in sooner to see her cardiologist. Please advise.

## 2022-07-10 NOTE — Telephone Encounter (Signed)
Documentation from 07/05/22 CT:  Cardiovascular: Atherosclerotic calcification of the aorta and coronary arteries. Enlarged pulmonic trunk and heart. Moderate pericardial effusion, unchanged.   Last saw Dr Tamala Julian 02/06/22 - was having SOB at that time. Is to follow up in 6-8 months - has appt with Dr Tamala Julian 10/16 @ 10 am.   Had Lexiscan:   Belva Crome, MD  02/20/2022  5:17 PM EDT     Let the patient know the stress test does not demonstrate blood flow abnormality.  In other words, no significant blockage in any of her major arteries.  Heart is strong.   Had Coronary CT 02/19/22 which demonstrated small pericardial effusion - no acute abnormalities per radiologist.   Pt reports having SOB for the last 2 weeks - SOB with activity and unable to lay flat.  Takes Furosemide 40 mg as needed for edema only.   Occasionally has tightness in chest - feels like a someone thumped her - sits down and leans forward.  Resolves after 2 to 3 mins with pursed lipped breathing.  Advised I will send this information to Dr Tamala Julian and his nurse for review to determine if any changes need to be made to her care.  Pt is aware she will be called back once we hear back from Dr Tamala Julian who is working in the hospital today.

## 2022-07-11 NOTE — Telephone Encounter (Signed)
Belva Crome, MD      07/10/22  5:40 PM She needs 2 D Doppler echo done for pericardial effusion and f/u Aortic regurgitation.   Notified the patient. Advised the patient that scheduling will call to set up the echo. Verbalized understanding and agreement.

## 2022-07-18 ENCOUNTER — Encounter: Payer: Self-pay | Admitting: Adult Health

## 2022-07-18 ENCOUNTER — Ambulatory Visit (INDEPENDENT_AMBULATORY_CARE_PROVIDER_SITE_OTHER): Payer: Medicare HMO | Admitting: Adult Health

## 2022-07-18 ENCOUNTER — Ambulatory Visit (HOSPITAL_COMMUNITY): Payer: Medicare HMO | Attending: Cardiology

## 2022-07-18 VITALS — BP 128/68 | HR 90 | Temp 98.2°F | Ht 67.5 in | Wt 224.6 lb

## 2022-07-18 DIAGNOSIS — I351 Nonrheumatic aortic (valve) insufficiency: Secondary | ICD-10-CM | POA: Diagnosis not present

## 2022-07-18 DIAGNOSIS — R042 Hemoptysis: Secondary | ICD-10-CM | POA: Diagnosis not present

## 2022-07-18 DIAGNOSIS — I3139 Other pericardial effusion (noninflammatory): Secondary | ICD-10-CM | POA: Insufficient documentation

## 2022-07-18 DIAGNOSIS — J984 Other disorders of lung: Secondary | ICD-10-CM | POA: Diagnosis not present

## 2022-07-18 LAB — ECHOCARDIOGRAM COMPLETE
Area-P 1/2: 4.44 cm2
Height: 67.5 in
P 1/2 time: 326 msec
S' Lateral: 3.2 cm
Weight: 3593.6 oz

## 2022-07-18 NOTE — Patient Instructions (Addendum)
Delsym Cough Syrup 2 tsp Twice daily  for 1 week then as needed  Activity as tolerated  Flu shot this fall.  CT chest in 1 year .  Follow up with Dr. Elsworth Soho  in 6 months and As needed   Please contact office for sooner follow up if symptoms do not improve or worsen or seek emergency care

## 2022-07-18 NOTE — Progress Notes (Signed)
_0  ID: [name], female    DOB: [birth year], 66 y.o.   MRN: [id number]  Chief Complaint  Patient presents with   Follow-up    Referring provider: Mckinley Jewel, MD  HPI: 66 year old female former smoker followed for chronic dyspnea attributed to restrictive lung disease, and recurrent hemoptysis  Complicated medical history with previous right upper lobectomy for spontaneous pneumothorax/pleural abrasions in 2007 by Dr. Roxan Hockey History of stage IIIb lung cancer in 2001 status post chemo and radiation, left upper lobectomy with lymph node dissection with Dr. Arlyce Dice.  Chronic pericarditis since 2013 with previous treatments including nonsteroidals, colchicine, steroids with previous work-up at [facility].  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  History of coronary artery disease, diastolic dysfunction, diabetes and A-fib on anticoagulation therapy with Eliquis  TEST/EVENTS :  CT chest February 09, 2020 no mediastinal or hilar adenopathy, left apical consolidation similar to 2018, no notable right-sided lung mass.   PFTs 2016 FVC 55%  FEV1 52% ratio 94   Spirometry 09/2009 showed decline in FEV1 from 57 to 40%. PFT 10/2009 showed FEV1 51% & FVC 47%, DLCO was 47% but corrected for alveolar volume    01/2014 CPET >> decreased exercise tolerance, peak VO2 56% of predicted, no ventilatory limitation, no cardiac limitation-O2 pulse 73% predicted, preexercise PFTs-FVC 51%, FEV1 54%, ratio 84, MVV 65%   Home sleep study June 2021 showed very mild sleep apnea especially events noted in the supine position-CPAP not indicated at that time  Hemoptysis since March 2022-ENT laryngoscopy negative, GI endoscopy negative, bronchoscopy negative  07/18/2022 Follow up: Hemoptysis  Patient returns for 20-monthfollow-up.  Patient says overall she is doing okay.  She says she occasionally gets some traces of blood tinged mucus.  No frank hemoptysis.  CT chest done on  July 05, 2022 showed postoperative and posttreatment changes in the hemothoraces bilaterally.  No evidence of recurrent or metastatic disease.  Unchanged pericardial effusion. We discussed her CT scan results in detail.   Patient has a upcoming follow-up with cardiology.  She has a 2D echo scheduled this afternoon Patient says she uses Delsym cough syrup as needed for cough control.  Patient would like to get her flu shot today.  She denies any chest pain orthopnea PND or increased leg swelling   Allergies  Allergen Reactions   Hydrocodone Hives and Itching   Morphine And Related Other (See Comments)    GI PROBLEMS   Crestor [Rosuvastatin] Other (See Comments)    Pt reports causes lower extremity muscle aches/cramping   Invokana [Canagliflozin] Diarrhea   Metformin Hcl Diarrhea   Lisinopril Cough   Nsaids Other (See Comments)    GI PROBLEMS    Immunization History  Administered Date(s) Administered   Influenza Split 08/14/2020   Influenza Whole 07/21/2009, 07/29/2010   Influenza,inj,Quad PF,6+ Mos 11/23/2015, 06/16/2019   Influenza-Unspecified 08/14/2016, 06/16/2019   Moderna SARS-COV2 Booster Vaccination 09/24/2020, 03/20/2021   PFIZER(Purple Top)SARS-COV-2 Vaccination 12/24/2019, 01/19/2020   PPD Test 06/19/2013   Pneumococcal Conjugate-13 04/18/2015   Pneumococcal Polysaccharide-23 07/29/2010, 06/16/2019, 06/16/2019   Tdap 09/24/2020   Zoster Recombinat (Shingrix) 12/09/2019, 02/14/2020    Past Medical History:  Diagnosis Date   Allergy    Anemia    Anemia    Aortic insufficiency    a. Mod by echo 05/2015.   Arthritis    "knees" (07/11/2015)   Atrial fibrillation ([facility]    On Eliquis   [place]  IIIB NSCL  Right hilar mass compressing esophagus/presenting with dysphagia Rx Surgery/RT/chemo dx January 2001   Coronary artery disease    a. Abnormal stress test -> LHc 06/2015 s/p DES to mCX and mLAD, residual D2 disease treated  medially.   Diverticulitis    Elevated blood pressure    Erythema nodosum    Heart murmur    Insomnia    lung ca dx'd 10/1999   chemo/xrt comp 05/29/2000   Myalgia    Nephrolithiasis 2015   Obesity    Pericardial effusion    a. 05/2015 Echo: EF 55-60%, no rwma, Gr 1 DD, no effusion - but an effusion was seen on CT which was felt to be increased in size compared to 12/2014 - pericardium also thickened.    Pericarditis    a. Dx 05/2015.   Pneumothorax, spontaneous, tension 01/20/2012   October, 2010   Pulmonary fibrosis ([place]) 01/20/2012   Due to previous surgery and chest radiation for lung cancer   Restrictive lung disease    Type II diabetes mellitus ([place])     Tobacco History: Social History   Tobacco Use  Smoking Status Former   Packs/day: 0.25   Years: 5.00   Total pack years: 1.25   Types: Cigarettes   Quit date: 07/22/1999   Years since quitting: 23.0  Smokeless Tobacco Never   Counseling given: Not Answered   Outpatient Medications Prior to Visit  Medication Sig Dispense Refill   ACCU-CHEK GUIDE test strip      Accu-Chek Softclix Lancets lancets USE LANCETS 3 TIMES A DAY AS DIRECTED 30 DAYS     acetaminophen (TYLENOL) 650 MG CR tablet Take 1,300 mg by mouth every 8 (eight) hours as needed for pain (headache).     apixaban (ELIQUIS) 5 MG TABS tablet TAKE 1 TABLET BY MOUTH TWICE A DAY 180 tablet 2   azaTHIOprine (IMURAN) 50 MG tablet Take 150 mg by mouth every morning.     B-D UF III MINI PEN NEEDLES 31G X 5 MM MISC 2 (two) times daily. as directed     Blood Glucose Monitoring Suppl (ACCU-CHEK GUIDE) w/Device KIT See admin instructions.     co-enzyme Q-10 50 MG capsule TAKE 1 CAPSULE BY MOUTH EVERY DAY (Patient taking differently: Take 50 mg by mouth every morning.) 30 capsule 7   diltiazem (CARDIZEM CD) 360 MG 24 hr capsule TAKE 1 CAPSULE BY MOUTH EVERY DAY (Patient taking differently: 360 mg every morning.) 90 capsule 3   folic acid (FOLVITE) 1 MG tablet Take 1 mg by  mouth every morning.     furosemide (LASIX) 40 MG tablet Take 1 tablet (40 mg total) by mouth daily as needed for fluid or edema. 30 tablet 6   levothyroxine (SYNTHROID, LEVOTHROID) 50 MCG tablet Take 50 mcg by mouth daily before breakfast.   6   losartan (COZAAR) 25 MG tablet TAKE 1 TABLET BY MOUTH EVERY DAY (Patient taking differently: Take 25 mg by mouth every morning.) 90 tablet 3   metoprolol succinate (TOPROL-XL) 50 MG 24 hr tablet Take 1.5 tablets (75 mg total) by mouth at bedtime. Take with or immediately following a meal. 135 tablet 3   nitrofurantoin, macrocrystal-monohydrate, (MACROBID) 100 MG capsule Take 1 capsule (100 mg total) by mouth 2 (two) times daily. 10 capsule 0   nitroGLYCERIN (NITROSTAT) 0.4 MG SL tablet Place 0.4 mg under the tongue every 5 (five) minutes as needed for chest pain.     nitroGLYCERIN (NITROSTAT) 0.4 MG SL tablet  See admin instructions.     NOVOLOG MIX 70/30 FLEXPEN (70-30) 100 UNIT/ML FlexPen Inject into the skin.     ondansetron (ZOFRAN) 4 MG tablet Take 1 tablet (4 mg total) by mouth every 8 (eight) hours as needed for nausea or vomiting. 20 tablet 0   OZEMPIC, 0.25 OR 0.5 MG/DOSE, 2 MG/1.5ML SOPN INJECT 0.5 MG INTO THE SKIN ONCE A WEEK. 3 pen 3   pravastatin (PRAVACHOL) 20 MG tablet TAKE 1 TABLET BY MOUTH EVERY DAY (Patient taking differently: Take 20 mg by mouth at bedtime.) 90 tablet 3   spironolactone (ALDACTONE) 25 MG tablet Take 1 tablet (25 mg total) by mouth daily. 90 tablet 3   Insulin Lispro Prot & Lispro (HUMALOG 75/25 MIX) (75-25) 100 UNIT/ML Kwikpen Inject 35-40 Units into the skin See admin instructions. Inject 40 units subcutaneously every morning and 30 units at night     No facility-administered medications prior to visit.     Review of Systems:   Constitutional:   No  weight loss, night sweats,  Fevers, chills,  +fatigue, or  lassitude.  HEENT:   No headaches,  Difficulty swallowing,  Tooth/dental problems, or  Sore throat,                 No sneezing, itching, ear ache, nasal congestion, post nasal drip,   CV:  No chest pain,  Orthopnea, PND, swelling in lower extremities, anasarca, dizziness, palpitations, syncope.   GI  No heartburn, indigestion, abdominal pain, nausea, vomiting, diarrhea, change in bowel habits, loss of appetite, bloody stools.   Resp:  No chest wall deformity  Skin: no rash or lesions.  GU: no dysuria, change in color of urine, no urgency or frequency.  No flank pain, no hematuria   MS:  No joint pain or swelling.  No decreased range of motion.  No back pain.    Physical Exam  BP 128/68 (BP Location: Left Arm, Patient Position: Sitting, Cuff Size: Large)   Pulse 90   Temp 98.2 F (36.8 C) (Oral)   Ht 5' 7.5" (1.715 m)   Wt 224 lb 9.6 oz (101.9 kg)   SpO2 99%   BMI 34.66 kg/m   GEN: A/Ox3; pleasant , NAD, well nourished    HEENT:  [place]/AT,   NOSE-clear, THROAT-clear, no lesions, no postnasal drip or exudate noted.   NECK:  Supple w/ fair ROM; no JVD; normal carotid impulses w/o bruits; no thyromegaly or nodules palpated; no lymphadenopathy.    RESP  Clear  P & A; w/o, wheezes/ rales/ or rhonchi. no accessory muscle use, no dullness to percussion  CARD:  RRR, no m/r/g, no peripheral edema, pulses intact, no cyanosis or clubbing.  GI:   Soft & nt; nml bowel sounds; no organomegaly or masses detected.   Musco: Warm bil, no deformities or joint swelling noted.   Neuro: alert, no focal deficits noted.    Skin: Warm, no lesions or rashes    Lab Results:  CBC       Imaging:         No data to display          No results found for: "NITRICOXIDE"      Assessment & Plan:   Hemoptysis Intermittent episodes of recurrent hemoptysis.  Has had an extensive work-up with laryngoscopy and bronchoscopy.  Felt to be attributable to chronic Eliquis use with friable mucosa.  Patient's had no significant increase in episodes.  Recent CT scan showed no evidence of recurrent  disease.  We will continue with yearly serial follow-up.  Plan  Patient Instructions  Delsym Cough Syrup 2 tsp Twice daily  for 1 week then as needed  Activity as tolerated  Flu shot this fall.  CT chest in 1 year .  Follow up with Dr. Elsworth Soho  in 6 months and As needed   Please contact office for sooner follow up if symptoms do not improve or worsen or seek emergency care       Restrictive lung disease Appears to be stable.  Continue on current regimen  Pericardial effusion History of chronic pericarditis and known pericardial effusion.  Continue follow-up with cardiology.  Recent CT scan shows stable pericardial effusion.  Patient has a planned echo later today.     Rexene Edison, NP 07/18/2022

## 2022-07-18 NOTE — Assessment & Plan Note (Signed)
Intermittent episodes of recurrent hemoptysis.  Has had an extensive work-up with laryngoscopy and bronchoscopy.  Felt to be attributable to chronic Eliquis use with friable mucosa.  Patient's had no significant increase in episodes.  Recent CT scan showed no evidence of recurrent disease.  We will continue with yearly serial follow-up.  Plan  Patient Instructions  Delsym Cough Syrup 2 tsp Twice daily  for 1 week then as needed  Activity as tolerated  Flu shot this fall.  CT chest in 1 year .  Follow up with Dr. Elsworth Soho  in 6 months and As needed   Please contact office for sooner follow up if symptoms do not improve or worsen or seek emergency care

## 2022-07-18 NOTE — Assessment & Plan Note (Signed)
History of chronic pericarditis and known pericardial effusion.  Continue follow-up with cardiology.  Recent CT scan shows stable pericardial effusion.  Patient has a planned echo later today.

## 2022-07-18 NOTE — Assessment & Plan Note (Signed)
Appears to be stable.  Continue on current regimen

## 2022-07-19 ENCOUNTER — Other Ambulatory Visit: Payer: Self-pay | Admitting: Cardiology

## 2022-07-22 ENCOUNTER — Other Ambulatory Visit: Payer: Self-pay | Admitting: Student

## 2022-08-01 DIAGNOSIS — M1712 Unilateral primary osteoarthritis, left knee: Secondary | ICD-10-CM | POA: Diagnosis not present

## 2022-08-01 DIAGNOSIS — M1812 Unilateral primary osteoarthritis of first carpometacarpal joint, left hand: Secondary | ICD-10-CM | POA: Diagnosis not present

## 2022-08-03 NOTE — Progress Notes (Unsigned)
Cardiology Office Note:    Date:  08/05/2022   ID:  [name], DOB 1956-05-10, MRN [id number]  PCP:  Mckinley Jewel, MD  Cardiologist:  Sinclair Grooms, MD   Referring MD: Mckinley Jewel, MD   Chief Complaint  Patient presents with   Atrial Fibrillation   Hypertension   Follow-up    Pericardial effusion    History of Present Illness:    [name] is a 66 y.o. female with a hx of CAD s/p DES to [facility] and mLAD 07/11/15 (patent stents on repeat cath 11/2016), chronic pericardial effusion, h/o chronic pericarditis with chronic pericardial rub treated intermittently with steroids and colchicine, iritis, PAF on Eliquis and dofetilide therapy, chronic diastolic CHF, DM, HTN, remote h/o treated non-small cell lung cancer, pulmonary fibrosis, aortic insufficiency, COPD and anemia.  S/P ablation and DC Dofetilide 02/2022.  He is doing well.  She was somewhat concerned about recent reports of a small amount of fluid around her heart on both echocardiogram done in September and CT scan done in May.  Since ablation, shortness of breath has significantly improved proved.  Even though Dr. Curt Bears told her to discontinue dofetilide, she has continued to take dofetilide and says that she will stop once her last pill was taken.  She does not have much longer to go.  She sleeps on 2 pillows.  There is no lower extremity edema.   Past Medical History:  Diagnosis Date   Allergy    Anemia    Anemia    Aortic insufficiency    a. Mod by echo 05/2015.   Arthritis    "knees" (07/11/2015)   Atrial fibrillation (HCC)    On Eliquis   Carcinoma of hilus of lung ([place]) 01/20/2012   IIIB NSCL  Right hilar mass compressing esophagus/presenting with dysphagia Rx Surgery/RT/chemo dx January 2001   Coronary artery disease    a. Abnormal stress test -> LHc 06/2015 s/p DES to mCX and mLAD, residual D2 disease treated medially.   Diverticulitis    Elevated blood pressure    Erythema nodosum    Heart  murmur    Insomnia    lung ca dx'd 10/1999   chemo/xrt comp 05/29/2000   Myalgia    Nephrolithiasis 2015   Obesity    Pericardial effusion    a. 05/2015 Echo: EF 55-60%, no rwma, Gr 1 DD, no effusion - but an effusion was seen on CT which was felt to be increased in size compared to 12/2014 - pericardium also thickened.    Pericarditis    a. Dx 05/2015.   Pneumothorax, spontaneous, tension 01/20/2012   October, 2010   Pulmonary fibrosis ([place]) 01/20/2012   Due to previous surgery and chest radiation for lung cancer   Restrictive lung disease    Type II diabetes mellitus [facility])     Past Surgical History:  Procedure Laterality Date   ABDOMINAL HYSTERECTOMY  10/21/2006   APPENDECTOMY  1969?   ATRIAL FIBRILLATION ABLATION N/A 02/27/2022   Procedure: ATRIAL FIBRILLATION ABLATION;  Surgeon: Constance Haw, MD;  Location: [place] [facility];  Service: Cardiovascular;  Laterality: N/A;   BRONCHIAL WASHINGS  08/03/2021   Procedure: BRONCHIAL WASHINGS;  Surgeon: Rigoberto Noel, MD;  Location: [place];  Service: Cardiopulmonary;;   CARDIAC CATHETERIZATION N/A 07/11/2015   Procedure: Left Heart Cath and Coronary Angiography;  Surgeon: Jettie Booze, MD;  Location: [place] [facility];  Service: Cardiovascular;  Laterality: N/A;  CARDIAC CATHETERIZATION  07/11/2015   Procedure: Coronary Stent Intervention;  Surgeon: Jettie Booze, MD;  Location: [place] [facility];  Service: Cardiovascular;;   CARPAL TUNNEL RELEASE Bilateral 1998-2005?   right-left   CARPAL TUNNEL RELEASE Left 2009?   "for the 2nd time"   COLONOSCOPY     COLONOSCOPY WITH PROPOFOL N/A 05/28/2018   Procedure: COLONOSCOPY WITH PROPOFOL;  Surgeon: Milus Banister, MD;  Location: [facility];  Service: Endoscopy;  Laterality: N/A;   CORONARY ANGIOPLASTY     CYSTOSCOPY W/ STONE MANIPULATION  10/22/2011   ESOPHAGOGASTRODUODENOSCOPY (EGD) WITH ESOPHAGEAL DILATION  10/21/2010   FOOT SURGERY Bilateral 06/22/1979    Callous removed    INNER EAR SURGERY Right ~ 2009   KNEE ARTHROPLASTY Right 10/21/1989   KNEE ARTHROSCOPY  ~ 2000   LATERAL RELEASE   LEFT HEART CATH AND CORONARY ANGIOGRAPHY N/A 12/18/2016   Procedure: Left Heart Cath and Coronary Angiography;  Surgeon: Burnell Blanks, MD;  Location: [place] [facility];  Service: Cardiovascular;  Laterality: N/A;   LEFT HEART CATH AND CORONARY ANGIOGRAPHY N/A 02/01/2021   Procedure: LEFT HEART CATH AND CORONARY ANGIOGRAPHY;  Surgeon: Leonie Man, MD;  Location: [place] [facility];  Service: Cardiovascular;  Laterality: N/A;   LUNG CANCER SURGERY Left 10/22/1999   "small cell"   LUNG SURGERY Right 10/21/2005   "reinflated it"   PULMONARY EMBOLISM SURGERY  10/21/2005   LUNG COLLAPSE   RIGHT AND LEFT HEART CATH N/A 06/25/2017   Procedure: RIGHT AND LEFT HEART CATH;  Surgeon: Sherren Mocha, MD;  Location: [place] [facility];  Service: Cardiovascular;  Laterality: N/A;   SHOULDER ADHESION RELEASE Right ~ 2004   SHOULDER ARTHROSCOPY W/ ROTATOR CUFF REPAIR Right ~ 2003   UPPER GASTROINTESTINAL ENDOSCOPY  06/05/2021   VIDEO BRONCHOSCOPY N/A 08/03/2021   Procedure: VIDEO BRONCHOSCOPY WITHOUT FLUORO;  Surgeon: Rigoberto Noel, MD;  Location: [place];  Service: Cardiopulmonary;  Laterality: N/A;    Current Medications: Current Meds  Medication Sig   ACCU-CHEK GUIDE test strip    Accu-Chek Softclix Lancets lancets USE LANCETS 3 TIMES A DAY AS DIRECTED 30 DAYS   acetaminophen (TYLENOL) 650 MG CR tablet Take 1,300 mg by mouth every 8 (eight) hours as needed for pain (headache).   apixaban (ELIQUIS) 5 MG TABS tablet TAKE 1 TABLET BY MOUTH TWICE A DAY   azaTHIOprine (IMURAN) 50 MG tablet Take 150 mg by mouth every morning.   B-D UF III MINI PEN NEEDLES 31G X 5 MM MISC 2 (two) times daily. as directed   Blood Glucose Monitoring Suppl (ACCU-CHEK GUIDE) w/Device KIT See admin instructions.   co-enzyme Q-10 50 MG capsule TAKE 1 CAPSULE BY MOUTH  EVERY DAY   diltiazem (CARDIZEM CD) 360 MG 24 hr capsule TAKE 1 CAPSULE BY MOUTH EVERY DAY   dofetilide (TIKOSYN) 500 MCG capsule Take 500 mcg by mouth 2 (two) times daily.   folic acid (FOLVITE) 1 MG tablet Take 1 mg by mouth every morning.   furosemide (LASIX) 40 MG tablet Take 1 tablet (40 mg total) by mouth daily as needed for fluid or edema.   levothyroxine (SYNTHROID, LEVOTHROID) 50 MCG tablet Take 50 mcg by mouth daily before breakfast.    losartan (COZAAR) 25 MG tablet TAKE 1 TABLET BY MOUTH EVERY DAY (Patient taking differently: Take 25 mg by mouth every morning.)   metoprolol succinate (TOPROL-XL) 50 MG 24 hr tablet Take 1.5 tablets (75 mg total) by mouth at bedtime.  Take with or immediately following a meal.   nitroGLYCERIN (NITROSTAT) 0.4 MG SL tablet Place 0.4 mg under the tongue every 5 (five) minutes as needed for chest pain.   NOVOLOG MIX 70/30 FLEXPEN (70-30) 100 UNIT/ML FlexPen Inject into the skin.   OZEMPIC, 0.25 OR 0.5 MG/DOSE, 2 MG/1.5ML SOPN INJECT 0.5 MG INTO THE SKIN ONCE A WEEK.   pravastatin (PRAVACHOL) 20 MG tablet TAKE 1 TABLET BY MOUTH EVERY DAY (Patient taking differently: Take 20 mg by mouth at bedtime.)   spironolactone (ALDACTONE) 25 MG tablet TAKE 1 TABLET (25 MG TOTAL) BY MOUTH DAILY.     Allergies:   Hydrocodone, Morphine and related, Crestor [rosuvastatin], Invokana [canagliflozin], Metformin hcl, Lisinopril, and Nsaids   Social History   Socioeconomic History   Marital status: Single    Spouse name: Not on file   Number of children: 0   Years of education: Not on file   Highest education level: Not on file  Occupational History   Occupation: RETIRED  Tobacco Use   Smoking status: Former    Packs/day: 0.25    Years: 5.00    Total pack years: 1.25    Types: Cigarettes    Quit date: 07/22/1999    Years since quitting: 23.0   Smokeless tobacco: Never  Vaping Use   Vaping Use: Never used  Substance and Sexual Activity   Alcohol use: No     Alcohol/week: 0.0 standard drinks of alcohol   Drug use: No   Sexual activity: Yes    Birth control/protection: Other-see comments    Comment: hysterectomy  Other Topics Concern   Not on file  Social History Narrative   Not on file   Social Determinants of Health   Financial Resource Strain: Low Risk  (07/12/2021)   Overall Financial Resource Strain (CARDIA)    Difficulty of Paying Living Expenses: Not hard at all  Food Insecurity: No Food Insecurity (07/12/2021)   Hunger Vital Sign    Worried About Running Out of Food in the Last Year: Never true    Ran Out of Food in the Last Year: Never true  Transportation Needs: No Transportation Needs (07/12/2021)   PRAPARE - Hydrologist (Medical): No    Lack of Transportation (Non-Medical): No  Physical Activity: Inactive (07/12/2021)   Exercise Vital Sign    Days of Exercise per Week: 0 days    Minutes of Exercise per Session: 0 min  Stress: No Stress Concern Present (07/12/2021)   [place]    Feeling of Stress : Not at all  Social Connections: Not on file     Family History: The patient's family history includes Diabetes in her father, maternal grandmother, mother, paternal grandmother, sister, sister, sister, and sister; Heart disease in her father; Hypertension in her sister; Kidney disease in her sister; Pancreatic cancer in her mother. There is no history of Colon cancer, Colon polyps, Rectal cancer, Stomach cancer, Esophageal cancer, or Breast cancer.  ROS:   Please see the history of present illness.    No complaints.  Concerned about the fluid in the sac around the heart.  All other systems reviewed and are negative.  EKGs/Labs/Other Studies Reviewed:    The following studies were reviewed today: No new or recent data  EKG:  EKG not repeated  Recent Labs: 11/27/2021: BUN 18; Creatinine, Ser 1.00; Potassium 4.3; Sodium 141; TSH  1.915 03/27/2022: Magnesium 2.0 06/13/2022: Hemoglobin 12.1; Platelets  292  Recent Lipid Panel    Component Value Date/Time   CHOL 127 11/27/2021 0408   CHOL 130 04/25/2021 1614   TRIG 72 11/27/2021 0408   HDL 44 11/27/2021 0408   HDL 44 04/25/2021 1614   CHOLHDL 2.9 11/27/2021 0408   VLDL 14 11/27/2021 0408   LDLCALC 69 11/27/2021 0408   LDLCALC 67 04/25/2021 1614    Physical Exam:    VS:  BP 124/60   Pulse 87   Ht 5' 7.5" (1.715 m)   Wt 224 lb 6.4 oz (101.8 kg)   SpO2 98%   BMI 34.63 kg/m     Wt Readings from Last 3 Encounters:  08/05/22 224 lb 6.4 oz (101.8 kg)  07/18/22 224 lb 9.6 oz (101.9 kg)  05/28/22 238 lb 12.8 oz (108.3 kg)     GEN: Slightly overweight. No acute distress.  Specifically no neck veins or edema in the lower extremities is noted. HEENT: Normal NECK: No JVD. LYMPHATICS: No lymphadenopathy CARDIAC: No murmur. RRR no gallop, or edema. VASCULAR:  Normal Pulses. No bruits. RESPIRATORY:  Clear to auscultation without rales, wheezing or rhonchi  ABDOMEN: Soft, non-tender, non-distended, No pulsatile mass, MUSCULOSKELETAL: No deformity  SKIN: Warm and dry NEUROLOGIC:  Alert and oriented x 3 PSYCHIATRIC:  Normal affect   ASSESSMENT:    1. Paroxysmal atrial fibrillation (HCC)   2. Pericardial effusion   3. Aortic valve insufficiency, etiology of cardiac valve disease unspecified   4. Chronic diastolic (congestive) heart failure ([place])   5. Secondary hypercoagulable state ([place])   6. Coronary artery disease involving native coronary artery of native heart without angina pectoris   7. OSA (obstructive sleep apnea)   8. Hypertension, essential    PLAN:    In order of problems listed above:  Clinically in sinus rhythm.  She has not yet stopped dofetilide.  Date is coming up, she has continued the medication despite Dr. Curt Bears telling her that she can to stop it.  She wants to use up all of her medications. She is not having symptoms or signs of  constriction/effusive constrictive pericardial disease.  She does have a small circumferential effusion on a CT scan performed in May and echocardiogram performed in September.  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  I have done a connective tissue work-up since she has uveitis but no signal towards connective tissue disease identified. No significant murmur Since A-fib ablation, dyspnea on exertion is significantly improved.   Continue apixaban. Secondary prevention reviewed. Compliant with CPAP. Good blood pressure control.  Dr. Johney Frame would be a good choice for this patient however she could not associate with any of the younger partners here at [employer].   Medication Adjustments/Labs and Tests Ordered: Current medicines are reviewed at length with the patient today.  Concerns regarding medicines are outlined above.  No orders of the defined types were placed in this encounter.  No orders of the defined types were placed in this encounter.   Patient Instructions  Medication Instructions:  Your physician recommends that you continue on your current medications as directed. Please refer to the Current Medication list given to you today.  *If you need a refill on your cardiac medications before your next appointment, please call your pharmacy*  Lab Work: NONE  Testing/Procedures: NONE  Follow-Up: At [facility], you and your health needs are our priority.  As part of our continuing mission to provide you with exceptional heart care, we have created  designated Provider Care Teams.  These Care Teams include your primary Cardiologist (physician) and Advanced Practice Providers (APPs -  Physician Assistants and Nurse Practitioners) who all work together to provide you with the care you need, when you need it.  Your next appointment:   1 year(s)  The format for your next appointment:   In Person  Provider:   Freada Bergeron,  MD  Important Information About Sugar         Signed, Sinclair Grooms, MD  08/05/2022 10:47 AM    [place]

## 2022-08-05 ENCOUNTER — Ambulatory Visit: Payer: Medicare HMO | Attending: Interventional Cardiology | Admitting: Interventional Cardiology

## 2022-08-05 ENCOUNTER — Encounter: Payer: Self-pay | Admitting: Interventional Cardiology

## 2022-08-05 VITALS — BP 124/60 | HR 87 | Ht 67.5 in | Wt 224.4 lb

## 2022-08-05 DIAGNOSIS — I480 Paroxysmal atrial fibrillation: Secondary | ICD-10-CM

## 2022-08-05 DIAGNOSIS — I10 Essential (primary) hypertension: Secondary | ICD-10-CM

## 2022-08-05 DIAGNOSIS — D6869 Other thrombophilia: Secondary | ICD-10-CM | POA: Diagnosis not present

## 2022-08-05 DIAGNOSIS — I351 Nonrheumatic aortic (valve) insufficiency: Secondary | ICD-10-CM | POA: Diagnosis not present

## 2022-08-05 DIAGNOSIS — G4733 Obstructive sleep apnea (adult) (pediatric): Secondary | ICD-10-CM

## 2022-08-05 DIAGNOSIS — I2510 Atherosclerotic heart disease of native coronary artery without angina pectoris: Secondary | ICD-10-CM | POA: Diagnosis not present

## 2022-08-05 DIAGNOSIS — I5032 Chronic diastolic (congestive) heart failure: Secondary | ICD-10-CM | POA: Diagnosis not present

## 2022-08-05 DIAGNOSIS — I3139 Other pericardial effusion (noninflammatory): Secondary | ICD-10-CM

## 2022-08-05 NOTE — Patient Instructions (Signed)
Medication Instructions:  Your physician recommends that you continue on your current medications as directed. Please refer to the Current Medication list given to you today.  *If you need a refill on your cardiac medications before your next appointment, please call your pharmacy*  Lab Work: NONE  Testing/Procedures: NONE  Follow-Up: At [facility], you and your health needs are our priority.  As part of our continuing mission to provide you with exceptional heart care, we have created designated Provider Care Teams.  These Care Teams include your primary Cardiologist (physician) and Advanced Practice Providers (APPs -  Physician Assistants and Nurse Practitioners) who all work together to provide you with the care you need, when you need it.  Your next appointment:   1 year(s)  The format for your next appointment:   In Person  Provider:   Freada Bergeron, MD  Important Information About Sugar

## 2022-08-06 ENCOUNTER — Encounter: Payer: Self-pay | Admitting: Physician Assistant

## 2022-08-06 ENCOUNTER — Ambulatory Visit (INDEPENDENT_AMBULATORY_CARE_PROVIDER_SITE_OTHER): Payer: Medicare HMO | Admitting: Physician Assistant

## 2022-08-06 ENCOUNTER — Other Ambulatory Visit (INDEPENDENT_AMBULATORY_CARE_PROVIDER_SITE_OTHER): Payer: Medicare HMO

## 2022-08-06 VITALS — BP 128/68 | HR 83 | Ht 67.5 in | Wt 223.5 lb

## 2022-08-06 DIAGNOSIS — R1011 Right upper quadrant pain: Secondary | ICD-10-CM | POA: Diagnosis not present

## 2022-08-06 DIAGNOSIS — R935 Abnormal findings on diagnostic imaging of other abdominal regions, including retroperitoneum: Secondary | ICD-10-CM

## 2022-08-06 DIAGNOSIS — R110 Nausea: Secondary | ICD-10-CM

## 2022-08-06 DIAGNOSIS — M940 Chondrocostal junction syndrome [Tietze]: Secondary | ICD-10-CM

## 2022-08-06 DIAGNOSIS — R1013 Epigastric pain: Secondary | ICD-10-CM

## 2022-08-06 LAB — CBC WITH DIFFERENTIAL/PLATELET
Basophils Absolute: 0 10*3/uL (ref 0.0–0.1)
Basophils Relative: 0.6 % (ref 0.0–3.0)
Eosinophils Absolute: 0.2 10*3/uL (ref 0.0–0.7)
Eosinophils Relative: 2.5 % (ref 0.0–5.0)
HCT: 35.7 % — ABNORMAL LOW (ref 36.0–46.0)
Hemoglobin: 11.8 g/dL — ABNORMAL LOW (ref 12.0–15.0)
Lymphocytes Relative: 17 % (ref 12.0–46.0)
Lymphs Abs: 1.2 10*3/uL (ref 0.7–4.0)
MCHC: 32.9 g/dL (ref 30.0–36.0)
MCV: 95.1 fl (ref 78.0–100.0)
Monocytes Absolute: 0.5 10*3/uL (ref 0.1–1.0)
Monocytes Relative: 7.6 % (ref 3.0–12.0)
Neutro Abs: 5 10*3/uL (ref 1.4–7.7)
Neutrophils Relative %: 72.3 % (ref 43.0–77.0)
Platelets: 336 10*3/uL (ref 150.0–400.0)
RBC: 3.75 Mil/uL — ABNORMAL LOW (ref 3.87–5.11)
RDW: 13.3 % (ref 11.5–15.5)
WBC: 6.9 10*3/uL (ref 4.0–10.5)

## 2022-08-06 LAB — LIPASE: Lipase: 47 U/L (ref 11.0–59.0)

## 2022-08-06 LAB — COMPREHENSIVE METABOLIC PANEL
ALT: 10 U/L (ref 0–35)
AST: 10 U/L (ref 0–37)
Albumin: 3.8 g/dL (ref 3.5–5.2)
Alkaline Phosphatase: 105 U/L (ref 39–117)
BUN: 16 mg/dL (ref 6–23)
CO2: 31 mEq/L (ref 19–32)
Calcium: 9 mg/dL (ref 8.4–10.5)
Chloride: 102 mEq/L (ref 96–112)
Creatinine, Ser: 0.92 mg/dL (ref 0.40–1.20)
GFR: 66.02 mL/min (ref >60.00)
Glucose, Bld: 161 mg/dL — ABNORMAL HIGH (ref 70–99)
Potassium: 3.8 mEq/L (ref 3.5–5.1)
Sodium: 139 mEq/L (ref 135–145)
Total Bilirubin: 0.4 mg/dL (ref 0.2–1.2)
Total Protein: 6.8 g/dL (ref 6.0–8.3)

## 2022-08-06 MED ORDER — PANTOPRAZOLE SODIUM 40 MG PO TBEC: 40.0000 mg | DELAYED_RELEASE_TABLET | Freq: Every day | ORAL | 4 refills | Status: DC

## 2022-08-06 NOTE — Progress Notes (Signed)
Subjective:    Patient ID: [name], female    DOB: Jan 07, 1956, 66 y.o.   MRN: [id number]  HPI  [name] is a pleasant 66 year old African-American female, established with Dr. Ardis Hughs who is referred back today by Dr. Doristine Bosworth for evaluation of right upper quadrant pain. She says her symptoms started in early August with episodes of sharp right upper quadrant pain which would radiate around into her back She also began having some nausea and says sometimes the smell of food would aggravate her symptoms but no vomiting, no fever or chills.  Pain was present at some point each day.  She says symptoms lasted for about 3 weeks, were not resolving so she went to urgent care on 06/13/2022. She had labs showing a WBC of 6.0/hemoglobin 11.3/hematocrit of 33 T. bili 0.4/alk phos 115/AST 12/ALT 8, UA was abnormal and urine culture was positive and she was treated. No ultrasound available at urgent care so she saw her PCP and had upper abdominal ultrasound done which did show sludge in the gallbladder no gallstones, no gallbladder wall thickening, and CBD of 2.9 mm. She did get a prescription for Zofran which she has been using off and on.  She says over the past month the symptoms have not been quite as bad but are still present she continues to have some nausea off and on and intermittent right upper quadrant pain.  She does not necessarily associate the pain with being postprandial. She had undergone EGD here in August 2022 with complaints of dysphagia and was found to have a benign proximal esophageal stricture at 22 cm which was dilated and a few small AVMs were noted in the esophagus as well as an unchanged polypoid lesion in the stomach (noted to be unchanged over 13 years). Last colonoscopy here 2019 with pandiverticulosis otherwise negative.  She does have history of coronary artery disease, status post stents x2, atrial fibrillation, congestive heart failure with preserved EF, chronic pericarditis for  which she is followed by Dr. Daneen Schick.  Hypertension, peripheral vascular disease, COPD, adult onset diabetes mellitus insulin-dependent and sleep apnea. She just had a recent 2D echo 06/23/2022 which showed a large pericardial effusion EF of 60 to 65% and no AAS.  This is felt to have occurred secondary to radiation for stage III lung cancer diagnosed in 2001.  She also had recent chest CT 07/08/2022 without contrast which showed postoperative and posttreatment changes in the hemithoraces bilaterally, no evidence of recurrent or metastatic disease, pericardial effusion unchanged, and enlarged pulmonic trunk indicated  of pulmonary artery hypertension.  Views of the visualized portions of the liver gallbladder and adrenal glands unremarkable [place] portions of the kidneys spleen pancreas and stomach and bowel otherwise grossly unremarkable no upper abdominal adenopathy. No NSAID use, has not been on PPI.  Review of Systems Pertinent positive and negative review of systems were noted in the above HPI section.  All other review of systems was otherwise negative.   Outpatient Encounter Medications as of 08/06/2022  Medication Sig   ACCU-CHEK GUIDE test strip    Accu-Chek Softclix Lancets lancets USE LANCETS 3 TIMES A DAY AS DIRECTED 30 DAYS   acetaminophen (TYLENOL) 650 MG CR tablet Take 1,300 mg by mouth every 8 (eight) hours as needed for pain (headache).   apixaban (ELIQUIS) 5 MG TABS tablet TAKE 1 TABLET BY MOUTH TWICE A DAY   azaTHIOprine (IMURAN) 50 MG tablet Take 150 mg by mouth every morning.   B-D UF III  MINI PEN NEEDLES 31G X 5 MM MISC 2 (two) times daily. as directed   Blood Glucose Monitoring Suppl (ACCU-CHEK GUIDE) w/Device KIT See admin instructions.   co-enzyme Q-10 50 MG capsule TAKE 1 CAPSULE BY MOUTH EVERY DAY   diltiazem (CARDIZEM CD) 360 MG 24 hr capsule TAKE 1 CAPSULE BY MOUTH EVERY DAY   dofetilide (TIKOSYN) 500 MCG capsule Take 500 mcg by mouth 2 (two) times daily.   folic  acid (FOLVITE) 1 MG tablet Take 1 mg by mouth every morning.   furosemide (LASIX) 40 MG tablet Take 1 tablet (40 mg total) by mouth daily as needed for fluid or edema.   levothyroxine (SYNTHROID, LEVOTHROID) 50 MCG tablet Take 50 mcg by mouth daily before breakfast.    losartan (COZAAR) 25 MG tablet TAKE 1 TABLET BY MOUTH EVERY DAY (Patient taking differently: Take 25 mg by mouth every morning.)   metoprolol succinate (TOPROL-XL) 50 MG 24 hr tablet Take 1.5 tablets (75 mg total) by mouth at bedtime. Take with or immediately following a meal.   nitroGLYCERIN (NITROSTAT) 0.4 MG SL tablet Place 0.4 mg under the tongue every 5 (five) minutes as needed for chest pain.   NOVOLOG MIX 70/30 FLEXPEN (70-30) 100 UNIT/ML FlexPen Inject into the skin.   pantoprazole (PROTONIX) 40 MG tablet Take 1 tablet (40 mg total) by mouth daily before breakfast.   pravastatin (PRAVACHOL) 20 MG tablet TAKE 1 TABLET BY MOUTH EVERY DAY (Patient taking differently: Take 20 mg by mouth at bedtime.)   spironolactone (ALDACTONE) 25 MG tablet TAKE 1 TABLET (25 MG TOTAL) BY MOUTH DAILY.   OZEMPIC, 0.25 OR 0.5 MG/DOSE, 2 MG/1.5ML SOPN INJECT 0.5 MG INTO THE SKIN ONCE A WEEK. (Patient not taking: Reported on 08/06/2022)   No facility-administered encounter medications on file as of 08/06/2022.   Allergies  Allergen Reactions   Hydrocodone Hives and Itching   Morphine And Related Other (See Comments)    GI PROBLEMS   Crestor [Rosuvastatin] Other (See Comments)    Pt reports causes lower extremity muscle aches/cramping   Invokana [Canagliflozin] Diarrhea   Metformin Hcl Diarrhea   Lisinopril Cough   Nsaids Other (See Comments)    GI PROBLEMS   Patient Active Problem List   Diagnosis Date Noted   Statin myopathy 05/28/2022   NSTEMI (non-ST elevated myocardial infarction) ([place]) 11/26/2021   OSA (obstructive sleep apnea) 07/25/2021   Hemoptysis 06/22/2021   Atherosclerosis of aorta ([place]) 03/13/2021   Palpitations  03/13/2021   Atypical angina - presumed 02/01/2021   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   Type 2 diabetes mellitus with stage 3 chronic kidney disease, with long-term current use of insulin ([place]) 07/17/2020   PVD (peripheral vascular disease) ([place]) 07/06/2020   Daytime sleepiness 02/14/2020   Paroxysmal atrial fibrillation ([place]) 01/25/2020   Secondary hypercoagulable state ([place]) 01/25/2020   Hypothyroidism 02/17/2019   Cellulitis of fourth toe, left 01/01/2019   Diverticulosis of colon without hemorrhage    Chronic diastolic (congestive) heart failure ([place]) 08/04/2017   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   Chronic pericarditis 06/13/2017   Unstable angina (HCC) 04/12/2017   Atrial fibrillation with RVR ([place])    Hyperlipidemia 08/07/2015   CAD S/P percutaneous coronary angioplasty 07/25/2015   Hypertension, essential    Aortic insufficiency    Stented coronary artery    Abnormal nuclear stress test 07/11/2015   Abnormal stress test 07/04/2015   Pericarditis    Controlled type 2 diabetes mellitus without complication, with long-term current use of  insulin (HCC)    Pericardial effusion    Chest pain 05/30/2015   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 ([place]) 05/23/2015   Open angle with borderline findings and low glaucoma risk in both eyes 05/23/2015   Restrictive lung disease 03/14/2015   CAD (coronary artery disease) 10/21/2014   Insomnia    Obesity    Anemia    Kidney stones    UNDIAGNOSED CARDIAC MURMURS 02/24/2009   DYSPNEA 09/20/2008   DIABETES MELLITUS, TYPE II 06/19/2007   Spontaneous pneumothorax 10/21/2005   Social History   Socioeconomic History   Marital status: Single    Spouse name: Not on file   Number of children: 0   Years of education: Not on file   Highest education level: Not on file  Occupational History   Occupation: RETIRED  Tobacco Use    Smoking status: Former    Packs/day: 0.25    Years: 5.00    Total pack years: 1.25    Types: Cigarettes    Quit date: 07/22/1999    Years since quitting: 23.0   Smokeless tobacco: Never  Vaping Use   Vaping Use: Never used  Substance and Sexual Activity   Alcohol use: No    Alcohol/week: 0.0 standard drinks of alcohol   Drug use: No   Sexual activity: Yes    Birth control/protection: Other-see comments    Comment: hysterectomy  Other Topics Concern   Not on file  Social History Narrative   Not on file   Social Determinants of Health   Financial Resource Strain: Low Risk  (07/12/2021)   Overall Financial Resource Strain (CARDIA)    Difficulty of Paying Living Expenses: Not hard at all  Food Insecurity: No Food Insecurity (07/12/2021)   Hunger Vital Sign    Worried About Running Out of Food in the Last Year: Never true    Ran Out of Food in the Last Year: Never true  Transportation Needs: No Transportation Needs (07/12/2021)   PRAPARE - Hydrologist (Medical): No    Lack of Transportation (Non-Medical): No  Physical Activity: Inactive (07/12/2021)   Exercise Vital Sign    Days of Exercise per Week: 0 days    Minutes of Exercise per Session: 0 min  Stress: No Stress Concern Present (07/12/2021)   [place]    Feeling of Stress : Not at all  Social Connections: Not on file  Intimate Partner Violence: Not At Risk (08/26/2018)   Humiliation, Afraid, Rape, and Kick questionnaire    Fear of Current or Ex-Partner: No    Emotionally Abused: No    Physically Abused: No    Sexually Abused: No    [name] family history includes Diabetes in her father, maternal grandmother, mother, paternal grandmother, sister, sister, sister, and sister; Heart disease in her father; Hypertension in her sister; Kidney disease in her sister; Pancreatic cancer in her mother.      Objective:    Vitals:    08/06/22 0924  BP: 128/68  Pulse: 83    Physical Exam Well-developed well-nourished older AA female  in no acute distress.  Height, Weight,223 BMI 34.49  HEENT; nontraumatic normocephalic, EOMI, PE R LA, sclera anicteric. Oropharynx; Neck; supple, no JVD Cardiovascular; regular rate and rhythm with S1-S2, no murmur rub or gallop Pulmonary; Clear bilaterally- she is quite tender over right costal margin/  ribs- nontender over left costal margin Abdomen; soft, nondistended,  she is tender in the RUQ and epigastrium,no palpable mass or hepatosplenomegaly, bowel sounds are active Rectal;not done today Skin; benign exam, no jaundice rash or appreciable lesions Extremities; no clubbing cyanosis or edema skin warm and dry Neuro/Psych; alert and oriented x4, grossly nonfocal mood and affect appropriate        Assessment & Plan:   #36 66 year old African-American female with multiple comorbidities with 4-monthhistory of intermittent right upper quadrant pain radiating to the back, and intermittent nausea.  On exam she definitely has costal margin tenderness along the right anterior lower ribs, however she is also tender in the right upper quadrant and epigastrium  Recent ultrasound did show gallbladder sludge, and her symptoms are certainly suggestive of biliary colic Rule out biliary dyskinesia, Less likely symptoms secondary to gastropathy or duodenopathy but cannot rule out  #2 colon cancer screening-up-to-date with colonoscopy done in 2019 showing pandiverticulosis, no polyps #3 chronic pericardial effusion #4 coronary artery disease status post previous stents #5.  Atrial fibrillation #6.  Congestive heart failure with preserved EF #7.  Adult onset diabetes mellitus #8.  Sleep apnea #9.  Peripheral vascular disease #10 chronic anticoagulation-on Eliquis  Plan; we will start empiric trial of Protonix 40 mg p.o. every morning AC breakfast Continue Zofran on a as needed basis for  nausea Schedule for CCK HIDA scan Check CBC with differential, c-Met and lipase today We discussed management of costochondritis type symptoms with heating pad and Tylenol.  Hopefully she will require endoscopy, however if symptoms are persisting and CCK HIDA is normal, this may need to be done and would be scheduled with Dr. CCandis Schatz(in Dr. JEugenia Pancoastabsence)     Jove Beyl SGenia HaroldPA-C 08/06/2022   Cc: PMckinley Jewel MD

## 2022-08-06 NOTE — Patient Instructions (Signed)
_______________________________________________________ Your provider has requested that you go to the basement level for lab work before leaving today. Press "B" on the elevator. The lab is located at the first door on the left as you exit the elevator.  We have sent the following medications to your pharmacy for you to pick up at your convenience: Protonix  Use Tylenol and heating pad for rib pain.  Take Protonix 40 MG every morning before breakfast.  You have been scheduled for a HIDA scan at [facility] (1st floor) on 08/23/22. Please arrive 15 minutes prior to your scheduled appointment at  8:46 am. Make certain not to have anything to eat or drink at least 6 hours prior to your test. Should this appointment date or time not work well for you, please call radiology scheduling at [phone].  _____________________________________________________________________ hepatobiliary (HIDA) scan is an imaging procedure used to diagnose problems in the liver, gallbladder and bile ducts. In the HIDA scan, a radioactive chemical or tracer is injected into a vein in your arm. The tracer is handled by the liver like bile. Bile is a fluid produced and excreted by your liver that helps your digestive system break down fats in the foods you eat. Bile is stored in your gallbladder and the gallbladder releases the bile when you eat a meal. A special nuclear medicine scanner (gamma camera) tracks the flow of the tracer from your liver into your gallbladder and small intestine.  During your HIDA scan  You'll be asked to change into a hospital gown before your HIDA scan begins. Your health care team will position you on a table, usually on your back. The radioactive tracer is then injected into a vein in your arm.The tracer travels through your bloodstream to your liver, where it's taken up by the bile-producing cells. The radioactive tracer travels with the bile from your liver into your gallbladder and  through your bile ducts to your small intestine.You may feel some pressure while the radioactive tracer is injected into your vein. As you lie on the table, a special gamma camera is positioned over your abdomen taking pictures of the tracer as it moves through your body. The gamma camera takes pictures continually for about an hour. You'll need to keep still during the HIDA scan. This can become uncomfortable, but you may find that you can lessen the discomfort by taking deep breaths and thinking about other things. Tell your health care team if you're uncomfortable. The radiologist will watch on a computer the progress of the radioactive tracer through your body. The HIDA scan may be stopped when the radioactive tracer is seen in the gallbladder and enters your small intestine. This typically takes about an hour. In some cases extra imaging will be performed if original images aren't satisfactory, if morphine is given to help visualize the gallbladder or if the medication CCK is given to look at the contraction of the gallbladder. This test typically takes 2 hours to complete. ________________________________________________________________________     If you are age 64 or younger, your body mass index should be between 19-25. Your Body mass index is 34.49 kg/m. If this is out of the aformentioned range listed, please consider follow up with your Primary Care Provider.   ________________________________________________________  The [place] [facility] providers would like to encourage you to use [facility] to communicate with providers for non-urgent requests or questions.  Due to long hold times on the telephone, sending your provider a message by [facility] may be a faster  and more efficient way to get a response.  Please allow 48 business hours for a response.  Please remember that this is for non-urgent requests.  _______________________________________________________  Due to recent changes in healthcare laws,  you may see the results of your imaging and laboratory studies on MyChart before your provider has had a chance to review them.  We understand that in some cases there may be results that are confusing or concerning to you. Not all laboratory results come back in the same time frame and the provider may be waiting for multiple results in order to interpret others.  Please give [place] 48 hours in order for your provider to thoroughly review all the results before contacting the office for clarification of your results.    Thank you for choosing me and [facility].  Edward Jolly, PA-C

## 2022-08-08 NOTE — Progress Notes (Signed)
Agree with the assessment and plan as outlined by Nicoletta Ba, PA-C.  Tavaris Eudy E. Candis Schatz, MD

## 2022-08-23 ENCOUNTER — Ambulatory Visit (HOSPITAL_COMMUNITY)
Admission: RE | Admit: 2022-08-23 | Discharge: 2022-08-23 | Disposition: A | Discharge status: Home / Self Care | Payer: Medicare HMO | Source: Ambulatory Visit | Attending: Physician Assistant | Admitting: Physician Assistant

## 2022-08-23 DIAGNOSIS — M940 Chondrocostal junction syndrome [Tietze]: Secondary | ICD-10-CM | POA: Diagnosis not present

## 2022-08-23 DIAGNOSIS — R110 Nausea: Secondary | ICD-10-CM | POA: Diagnosis not present

## 2022-08-23 DIAGNOSIS — R1013 Epigastric pain: Secondary | ICD-10-CM | POA: Diagnosis not present

## 2022-08-23 DIAGNOSIS — R935 Abnormal findings on diagnostic imaging of other abdominal regions, including retroperitoneum: Secondary | ICD-10-CM | POA: Diagnosis not present

## 2022-08-23 DIAGNOSIS — R1011 Right upper quadrant pain: Secondary | ICD-10-CM | POA: Diagnosis not present

## 2022-08-23 MED ORDER — TECHNETIUM TC 99M MEBROFENIN IV KIT
5.1200 | PACK | Freq: Once | INTRAVENOUS | Status: AC
  Administered 2022-08-23: 5.12 via INTRAVENOUS

## 2022-08-26 ENCOUNTER — Telehealth: Payer: Self-pay

## 2022-08-26 NOTE — Telephone Encounter (Signed)
Patient reports the nausea is not every day. The pain is unchanged. Not worse and has not changed in any way. It is persistent. Medications confirmed.

## 2022-08-26 NOTE — Telephone Encounter (Signed)
Discussed with the patient. She agrees to this plan and referral. Records faxed to [facility].

## 2022-08-26 NOTE — Telephone Encounter (Signed)
-----   Message from Alfredia Ferguson, PA-C sent at 08/26/2022 12:21 PM EST ----- [name] - please call pt and let her know the CCK-HIDA scan is normal . Please find out is still having RUQ pain, nausea- I had put her on protonix and Zofran

## 2022-09-05 DIAGNOSIS — H10413 Chronic giant papillary conjunctivitis, bilateral: Secondary | ICD-10-CM | POA: Diagnosis not present

## 2022-09-05 DIAGNOSIS — H2513 Age-related nuclear cataract, bilateral: Secondary | ICD-10-CM | POA: Diagnosis not present

## 2022-09-05 DIAGNOSIS — H04123 Dry eye syndrome of bilateral lacrimal glands: Secondary | ICD-10-CM | POA: Diagnosis not present

## 2022-09-05 DIAGNOSIS — I10 Essential (primary) hypertension: Secondary | ICD-10-CM | POA: Diagnosis not present

## 2022-09-05 DIAGNOSIS — H0261 Xanthelasma of right upper eyelid: Secondary | ICD-10-CM | POA: Diagnosis not present

## 2022-09-05 DIAGNOSIS — I2510 Atherosclerotic heart disease of native coronary artery without angina pectoris: Secondary | ICD-10-CM | POA: Diagnosis not present

## 2022-09-05 DIAGNOSIS — E119 Type 2 diabetes mellitus without complications: Secondary | ICD-10-CM | POA: Diagnosis not present

## 2022-09-05 DIAGNOSIS — E039 Hypothyroidism, unspecified: Secondary | ICD-10-CM | POA: Diagnosis not present

## 2022-09-05 DIAGNOSIS — H43821 Vitreomacular adhesion, right eye: Secondary | ICD-10-CM | POA: Diagnosis not present

## 2022-09-05 DIAGNOSIS — I4891 Unspecified atrial fibrillation: Secondary | ICD-10-CM | POA: Diagnosis not present

## 2022-09-05 DIAGNOSIS — E7800 Pure hypercholesterolemia, unspecified: Secondary | ICD-10-CM | POA: Diagnosis not present

## 2022-09-05 DIAGNOSIS — H40033 Anatomical narrow angle, bilateral: Secondary | ICD-10-CM | POA: Diagnosis not present

## 2022-09-05 DIAGNOSIS — H15102 Unspecified episcleritis, left eye: Secondary | ICD-10-CM | POA: Diagnosis not present

## 2022-09-05 DIAGNOSIS — H31012 Macula scars of posterior pole (postinflammatory) (post-traumatic), left eye: Secondary | ICD-10-CM | POA: Diagnosis not present

## 2022-09-05 DIAGNOSIS — E1165 Type 2 diabetes mellitus with hyperglycemia: Secondary | ICD-10-CM | POA: Diagnosis not present

## 2022-09-06 ENCOUNTER — Ambulatory Visit: Payer: Self-pay | Admitting: Surgery

## 2022-09-06 DIAGNOSIS — K8050 Calculus of bile duct without cholangitis or cholecystitis without obstruction: Secondary | ICD-10-CM | POA: Diagnosis not present

## 2022-09-06 NOTE — H&P (View-Only) (Signed)
[name] [id number]   Referring Provider:  Esterwood, Amy Susan, PA   Subjective   Chief Complaint: Abdominal Pain     History of Present Illness:    66-year-old woman with history of diabetes, pulmonary fibrosis/restrictive lung disease, history of spontaneous pneumothorax, pericarditis and chronic pericardial effusion, pulmonary hypertension, emphysema obesity, kidney stones, myalgia, lung cancer, diverticulitis, coronary artery disease that is post stents congestive heart failure with preserved EF, sleep apnea, atrial fibrillation on Eliquis, arthritis who is referred for evaluation of chronic intermittent nausea and right upper quadrant pain radiating to the back.  This has been ongoing for about 3 months now-started in August.  She states that it is occasionally related to certain movements or positions, but she also notes that the pain comes on when she is cooking and smells food, she develops nausea as well as the pain.  She really does not eat much.  Denies weight loss or concern for dehydration at this point.  No fevers or jaundice. Previous abdominal surgery includes hysterectomy, appendectomy. Had a HIDA on November 3 that was normal.  Ejection fraction 38% (lower limit of normal 33%), patent cystic and common bile ducts. Ultrasound 06/18/2022 with gallbladder sludge and reportedly positive sonographic Murphy sign but no other findings of cholecystitis.  Labs done October 17 including CMP and CBC unremarkable    Review of Systems: A complete review of systems was obtained from the patient.  I have reviewed this information and discussed as appropriate with the patient.  See HPI as well for other ROS.   Medical History: Past Medical History:  Diagnosis Date   Arthritis    CAD (coronary artery disease) 2016   DES to mCX and mLAD 06/2015   CHF (congestive heart failure) (CMS-HCC)    COPD (chronic obstructive pulmonary disease) (CMS-HCC)    Diabetes mellitus type II,  controlled (CMS-HCC)    HTN (hypertension)    Non-small cell lung cancer (CMS-HCC)    treated with chemo and radiation   PAF (paroxysmal atrial fibrillation) (CMS-HCC)    Pericardial effusion    chronic   Pulmonary fibrosis (CMS-HCC)    Spontaneous pneumothorax 2007   Right    Patient Active Problem List  Diagnosis   Pericarditis   Spontaneous pneumothorax   Pulmonary fibrosis (CMS-HCC)   Pericardial effusion   PAF (paroxysmal atrial fibrillation) (CMS-HCC)   Non-small cell lung cancer (CMS-HCC)   HTN (hypertension)   Diabetes mellitus type II, controlled (CMS-HCC)   COPD (chronic obstructive pulmonary disease) (CMS-HCC)   CAD (coronary artery disease)    Past Surgical History:  Procedure Laterality Date   APPENDECTOMY  1966   carpal tunnel surgery Bilateral    ear surgery Right    KNEE ARTHROSCOPY Left    lung cancer resection Left    rotator cuff surgery Left      Allergies  Allergen Reactions   Vicodin [Hydrocodone-Acetaminophen] Hives   Canagliflozin Diarrhea and Other (See Comments)   Metformin Hcl Diarrhea   Lisinopril Other (See Comments)    Current Outpatient Medications on File Prior to Visit  Medication Sig Dispense Refill   BD ULTRA-FINE MINI PEN NEEDLE 31 gauge x 3/16" needle USE AS DIRECTED TWICE DAILY  5   diltiazem (CARDIZEM CD) 240 MG CD capsule Take 240 mg by mouth once daily.  3   dofetilide (TIKOSYN) 500 MCG capsule 1 capsule Orally Twice a day for 30 day(s)     ELIQUIS 5 mg tablet Take 5 mg by mouth 2 (  two) times daily.  3   levothyroxine (SYNTHROID, LEVOTHROID) 50 MCG tablet Take 50 mcg by mouth once daily.  6   losartan (COZAAR) 25 MG tablet Take 25 mg by mouth once daily.  3   metoprolol succinate (TOPROL-XL) 50 MG XL tablet Take by mouth     NOVOLOG MIX 70-30FLEXPEN U-100 100 unit/mL (70-30) pen injector 40uam 25upm Subcutaneous Twice a day     omega-3-dha-epa-fish oil-coQ10 (COQMAX OMEGA) 348-500-100 mg Cap as directed Orally      ONETOUCH DELICA LANCETS 33 gauge Misc USE AS DIRECTED 3 TIMES A DAY  4   ONETOUCH ULTRA BLUE TEST STRIP test strip USE AS DIRECTED 3 TIMES A DAY  7   ONETOUCH ULTRAMINI kit USE AS DIRECTED 4 TIMES A DAY  0   pravastatin (PRAVACHOL) 20 MG tablet 1 tablet Orally Once a day     spironolactone (ALDACTONE) 25 MG tablet 1 tablet Orally     HUMALOG MIX 75-25 KWIKPEN 100 unit/mL (75-25) pen injector Inject 25 Units subcutaneously 2 (two) times daily. Inject 60 units in AM, and 25 units in PM  (Patient not taking: Reported on 09/06/2022)  6   meloxicam (MOBIC) 15 MG tablet Take 15 mg by mouth once daily. (Patient not taking: Reported on 09/06/2022)  1   metoprolol tartrate (LOPRESSOR) 25 MG tablet TAKE 1 TABLET (25 MG TOTAL) BY MOUTH 2 (TWO) TIMES DAILY. (Patient not taking: Reported on 09/06/2022)  2   rosuvastatin (CRESTOR) 5 MG tablet Take 5 mg by mouth once daily. (Patient not taking: Reported on 09/06/2022)  3   No current facility-administered medications on file prior to visit.    History reviewed. No pertinent family history.   Social History   Tobacco Use  Smoking Status Former   Packs/day: .1   Types: Cigarettes   Quit date: 07/24/1999   Years since quitting: 23.1  Smokeless Tobacco Never     Social History   Socioeconomic History   Marital status: Unknown  Occupational History   Occupation: retired patient care attendant  Tobacco Use   Smoking status: Former    Packs/day: .1    Types: Cigarettes    Quit date: 07/24/1999    Years since quitting: 23.1   Smokeless tobacco: Never  Substance and Sexual Activity   Alcohol use: No   Drug use: No    Objective:    Vitals:   09/06/22 1524  BP: 136/80  Pulse: 104  Temp: 36.9 C (98.4 F)  SpO2: 96%  Weight: (!) 104.8 kg (231 lb)  Height: 171.5 cm (5' 7.5")    Body mass index is 35.65 kg/m.  Gen: A&Ox3, no distress  Respirations unlabored Abd soft, focally tender in the right upper quadrant and slightly along the  right costal margin  Assessment and Plan:  Diagnoses and all orders for this visit:  Biliary colic  She does have gallbladder sludge, some of her symptoms do sound very consistent with biliary colic and she is focally tender in the region of the gallbladder.  I did discuss with her that some of her symptoms sound slightly more musculoskeletal.  We discussed the relevant anatomy and function of the biliary tract using a diagram to demonstrate.  We discussed laparoscopic cholecystectomy including surgical technique, risks of bleeding, infection, pain, scarring, intra-abdominal injury specifically to the common bile duct and sequelae, bile leak, conversion to open surgery or subtotal cholecystectomy, as well as, most importantly for her, postoperative cardiovascular, pulmonary or thromboembolic complications.  Questions   were welcomed and answered to her satisfaction.  Given her history and finding of sludge I do think proceeding with cholecystectomy is reasonable, she understands that some of her symptoms may be more musculoskeletal.  She wishes to proceed with surgery.  We will request cardiac clearance and she will need to hold her Eliquis for at least 2 days before surgery.  Meryem Haertel Raquel James, MD

## 2022-09-06 NOTE — H&P (Signed)
[name] [id number]   Referring Provider:  Esterwood, Amy Susan, PA   Subjective   Chief Complaint: Abdominal Pain     History of Present Illness:    66-year-old woman with history of diabetes, pulmonary fibrosis/restrictive lung disease, history of spontaneous pneumothorax, pericarditis and chronic pericardial effusion, pulmonary hypertension, emphysema obesity, kidney stones, myalgia, lung cancer, diverticulitis, coronary artery disease that is post stents congestive heart failure with preserved EF, sleep apnea, atrial fibrillation on Eliquis, arthritis who is referred for evaluation of chronic intermittent nausea and right upper quadrant pain radiating to the back.  This has been ongoing for about 3 months now-started in August.  She states that it is occasionally related to certain movements or positions, but she also notes that the pain comes on when she is cooking and smells food, she develops nausea as well as the pain.  She really does not eat much.  Denies weight loss or concern for dehydration at this point.  No fevers or jaundice. Previous abdominal surgery includes hysterectomy, appendectomy. Had a HIDA on November 3 that was normal.  Ejection fraction 38% (lower limit of normal 33%), patent cystic and common bile ducts. Ultrasound 06/18/2022 with gallbladder sludge and reportedly positive sonographic Murphy sign but no other findings of cholecystitis.  Labs done October 17 including CMP and CBC unremarkable    Review of Systems: A complete review of systems was obtained from the patient.  I have reviewed this information and discussed as appropriate with the patient.  See HPI as well for other ROS.   Medical History: Past Medical History:  Diagnosis Date   Arthritis    CAD (coronary artery disease) 2016   DES to mCX and mLAD 06/2015   CHF (congestive heart failure) (CMS-HCC)    COPD (chronic obstructive pulmonary disease) (CMS-HCC)    Diabetes mellitus type II,  controlled (CMS-HCC)    HTN (hypertension)    Non-small cell lung cancer (CMS-HCC)    treated with chemo and radiation   PAF (paroxysmal atrial fibrillation) (CMS-HCC)    Pericardial effusion    chronic   Pulmonary fibrosis (CMS-HCC)    Spontaneous pneumothorax 2007   Right    Patient Active Problem List  Diagnosis   Pericarditis   Spontaneous pneumothorax   Pulmonary fibrosis (CMS-HCC)   Pericardial effusion   PAF (paroxysmal atrial fibrillation) (CMS-HCC)   Non-small cell lung cancer (CMS-HCC)   HTN (hypertension)   Diabetes mellitus type II, controlled (CMS-HCC)   COPD (chronic obstructive pulmonary disease) (CMS-HCC)   CAD (coronary artery disease)    Past Surgical History:  Procedure Laterality Date   APPENDECTOMY  1966   carpal tunnel surgery Bilateral    ear surgery Right    KNEE ARTHROSCOPY Left    lung cancer resection Left    rotator cuff surgery Left      Allergies  Allergen Reactions   Vicodin [Hydrocodone-Acetaminophen] Hives   Canagliflozin Diarrhea and Other (See Comments)   Metformin Hcl Diarrhea   Lisinopril Other (See Comments)    Current Outpatient Medications on File Prior to Visit  Medication Sig Dispense Refill   BD ULTRA-FINE MINI PEN NEEDLE 31 gauge x 3/16" needle USE AS DIRECTED TWICE DAILY  5   diltiazem (CARDIZEM CD) 240 MG CD capsule Take 240 mg by mouth once daily.  3   dofetilide (TIKOSYN) 500 MCG capsule 1 capsule Orally Twice a day for 30 day(s)     ELIQUIS 5 mg tablet Take 5 mg by mouth 2 (  two) times daily.  3   levothyroxine (SYNTHROID, LEVOTHROID) 50 MCG tablet Take 50 mcg by mouth once daily.  6   losartan (COZAAR) 25 MG tablet Take 25 mg by mouth once daily.  3   metoprolol succinate (TOPROL-XL) 50 MG XL tablet Take by mouth     NOVOLOG MIX 70-30FLEXPEN U-100 100 unit/mL (70-30) pen injector 40uam 25upm Subcutaneous Twice a day     omega-3-dha-epa-fish oil-coQ10 (COQMAX OMEGA) 348-500-100 mg Cap as directed Orally      ONETOUCH DELICA LANCETS 33 gauge Misc USE AS DIRECTED 3 TIMES A DAY  4   ONETOUCH ULTRA BLUE TEST STRIP test strip USE AS DIRECTED 3 TIMES A DAY  7   ONETOUCH ULTRAMINI kit USE AS DIRECTED 4 TIMES A DAY  0   pravastatin (PRAVACHOL) 20 MG tablet 1 tablet Orally Once a day     spironolactone (ALDACTONE) 25 MG tablet 1 tablet Orally     HUMALOG MIX 75-25 KWIKPEN 100 unit/mL (75-25) pen injector Inject 25 Units subcutaneously 2 (two) times daily. Inject 60 units in AM, and 25 units in PM  (Patient not taking: Reported on 09/06/2022)  6   meloxicam (MOBIC) 15 MG tablet Take 15 mg by mouth once daily. (Patient not taking: Reported on 09/06/2022)  1   metoprolol tartrate (LOPRESSOR) 25 MG tablet TAKE 1 TABLET (25 MG TOTAL) BY MOUTH 2 (TWO) TIMES DAILY. (Patient not taking: Reported on 09/06/2022)  2   rosuvastatin (CRESTOR) 5 MG tablet Take 5 mg by mouth once daily. (Patient not taking: Reported on 09/06/2022)  3   No current facility-administered medications on file prior to visit.    History reviewed. No pertinent family history.   Social History   Tobacco Use  Smoking Status Former   Packs/day: .1   Types: Cigarettes   Quit date: 07/24/1999   Years since quitting: 23.1  Smokeless Tobacco Never     Social History   Socioeconomic History   Marital status: Unknown  Occupational History   Occupation: retired patient care attendant  Tobacco Use   Smoking status: Former    Packs/day: .1    Types: Cigarettes    Quit date: 07/24/1999    Years since quitting: 23.1   Smokeless tobacco: Never  Substance and Sexual Activity   Alcohol use: No   Drug use: No    Objective:    Vitals:   09/06/22 1524  BP: 136/80  Pulse: 104  Temp: 36.9 C (98.4 F)  SpO2: 96%  Weight: (!) 104.8 kg (231 lb)  Height: 171.5 cm (5' 7.5")    Body mass index is 35.65 kg/m.  Gen: A&Ox3, no distress  Respirations unlabored Abd soft, focally tender in the right upper quadrant and slightly along the  right costal margin  Assessment and Plan:  Diagnoses and all orders for this visit:  Biliary colic  She does have gallbladder sludge, some of her symptoms do sound very consistent with biliary colic and she is focally tender in the region of the gallbladder.  I did discuss with her that some of her symptoms sound slightly more musculoskeletal.  We discussed the relevant anatomy and function of the biliary tract using a diagram to demonstrate.  We discussed laparoscopic cholecystectomy including surgical technique, risks of bleeding, infection, pain, scarring, intra-abdominal injury specifically to the common bile duct and sequelae, bile leak, conversion to open surgery or subtotal cholecystectomy, as well as, most importantly for her, postoperative cardiovascular, pulmonary or thromboembolic complications.  Questions   were welcomed and answered to her satisfaction.  Given her history and finding of sludge I do think proceeding with cholecystectomy is reasonable, she understands that some of her symptoms may be more musculoskeletal.  She wishes to proceed with surgery.  We will request cardiac clearance and she will need to hold her Eliquis for at least 2 days before surgery.  Enjoli Tidd Raquel James, MD

## 2022-09-09 ENCOUNTER — Telehealth: Payer: Self-pay

## 2022-09-09 NOTE — Telephone Encounter (Signed)
     Primary Cardiologist: Sinclair Grooms, MD  Chart reviewed as part of pre-operative protocol coverage. Given past medical history and time since last visit, based on ACC/AHA guidelines, [name] would be at acceptable risk for the planned procedure without further cardiovascular testing.   Patient with diagnosis of afib on Eliquis for anticoagulation.     Procedure: lap chole surgery Date of procedure: TBD   CHA2DS2-VASc Score = 5  This indicates a 7.2% annual risk of stroke. The patient's score is based upon: CHF History: 1 HTN History: 1 Diabetes History: 1 Stroke History: 0 Vascular Disease History: 1 Age Score: 0 Gender Score: 1   CrCl 62mL/min using adjusted body weight due to obesity Platelet count 336K   Per office protocol, patient can hold Eliquis for 3 days prior to procedure.  I will route this recommendation to the requesting party via Epic fax function and remove from pre-op pool.  Please call with questions.  Jossie Ng. Katlynne Mckercher NP-C     09/09/2022, 12:57 PM [address] [place] [address] Office [phone] Fax [phone]

## 2022-09-09 NOTE — Telephone Encounter (Signed)
Patient with diagnosis of afib on Eliquis for anticoagulation.    Procedure: lap chole surgery Date of procedure: TBD  CHA2DS2-VASc Score = 5  This indicates a 7.2% annual risk of stroke. The patient's score is based upon: CHF History: 1 HTN History: 1 Diabetes History: 1 Stroke History: 0 Vascular Disease History: 1 Age Score: 0 Gender Score: 1   CrCl 92mL/min using adjusted body weight due to obesity Platelet count 336K  Per office protocol, patient can hold Eliquis for 3 days prior to procedure.    **This guidance is not considered finalized until pre-operative APP has relayed final recommendations.**

## 2022-09-09 NOTE — Telephone Encounter (Signed)
..     Pre-operative Risk Assessment    Patient Name: [name]  DOB: 09/06/1956 MRN: [id number]      Request for Surgical Clearance    Procedure:   LAP CHOLE SURGERY  Date of Surgery:  Clearance TBD                                 Surgeon:  Jens Som MD Surgeon's Group or Practice Name:  [facility] Phone number:  [phone] Fax number:  [phone]   Type of Clearance Requested:   - Medical  - Pharmacy:  Hold Apixaban (Eliquis)     Type of Anesthesia:  General    Additional requests/questions:    Gwenlyn Found   09/09/2022, 10:39 AM

## 2022-09-24 NOTE — Progress Notes (Addendum)
Anesthesia Review:  PCP: DR Deretha Emory  Endocrinologist- DR [name] - 09/05/22- LOV  Cardiologist : Daneen Schick-- LOV 08/05/22  cardiac clearance 09/09/22- in epic - telephone encounter  - Coletta Memos, NP  Will Stonewall 05/28/22  Chest x-ray : 11/26/21- 2 view  CT Chest- 07/08/22  EKG : 05/28/22  Echo : 07/18/22  Afib ablation - 02/27/22  Stress test: 02/20/22  Ct Cardiac- 02/19/22  Cardiac Cath :  Activity level: can do a flight of stairs without difficutly  Sleep Study/ CPAP : none  Fasting Blood Sugar :      / Checks Blood Sugar -- times a day:   Blood Thinner/ Instructions /Last Dose: ASA / Instructions/ Last Dose :   Eliquis- pt has not yet received instructions regarding Eliquis at time of preop . PT aware and note on preop instructions for pt to contact Dr Kae Heller and Dr Daneen Schick in regards to Eliquis instructions.  Pt states she will leave preop appt and go by office.    DM- type 2 - checks glucose am and pm at home.   Hgba1c-09/27/22- 6.3   Ozempic- last dose 06/2022 per pt  BMP done 09/27/22- Potassium- 5.3 routed to Dr Kae Heller.

## 2022-09-25 NOTE — Patient Instructions (Addendum)
SURGICAL WAITING ROOM VISITATION Patients having surgery or a procedure may have no more than 2 support people in the waiting area - these visitors may rotate.   Children under the age of 49 must have an adult with them who is not the patient. If the patient needs to stay at the hospital during part of their recovery, the visitor guidelines for inpatient rooms apply. Pre-op nurse will coordinate an appropriate time for 1 support person to accompany patient in pre-op.  This support person may not rotate.    Please refer to the [facility] website for the visitor guidelines for Inpatients (after your surgery is over and you are in a regular room).       Your procedure is scheduled on:  10/01/2022    Report to [facility] Main Entrance    Report to admitting at   1215 pm    Call this number if you have problems the morning of surgery [phone]   Do not eat food   :After Midnight.   After Midnight you may have the following liquids until __ 1115____ AM  DAY OF SURGERY  Water Non-Citrus Juices (without pulp, NO RED) Carbonated Beverages Black Coffee (NO MILK/CREAM OR CREAMERS, sugar ok)  Clear Tea (NO MILK/CREAM OR CREAMERS, sugar ok) regular and decaf                             Plain Jell-O (NO RED)                                           Fruit ices (not with fruit pulp, NO RED)                                     Popsicles (NO RED)                                                               Sports drinks like Gatorade (NO RED)               f           If you have questions, please contact your surgeon's office.        Oral Hygiene is also important to reduce your risk of infection.                                    Remember - BRUSH YOUR TEETH THE MORNING OF SURGERY WITH YOUR REGULAR TOOTHPASTE  DENTURES WILL BE REMOVED PRIOR TO SURGERY PLEASE DO NOT APPLY "Poly grip" OR ADHESIVES!!!   Do NOT smoke after Midnight   Take these medicines the morning of  surgery with A SIP OF WATER:     cardezem, tikosyn, synthroi,d protonix      DO NOT TAKE ANY ORAL DIABETIC MEDICATIONS DAY OF YOUR SURGERY  Bring CPAP mask and tubing day of surgery.  You may not have any metal on your body including hair pins, jewelry, and body piercing             Do not wear make-up, lotions, powders, perfumes/cologne, or deodorant  Do not wear nail polish including gel and S&S, artificial/acrylic nails, or any other type of covering on natural nails including finger and toenails. If you have artificial nails, gel coating, etc. that needs to be removed by a nail salon please have this removed prior to surgery or surgery may need to be canceled/ delayed if the surgeon/ anesthesia feels like they are unable to be safely monitored.   Do not shave  48 hours prior to surgery.               Men may shave face and neck.   Do not bring valuables to the hospital. [place].   Contacts, glasses, dentures or bridgework may not be worn into surgery.   Bring small overnight bag day of surgery.   DO NOT Price. PHARMACY WILL DISPENSE MEDICATIONS LISTED ON YOUR MEDICATION LIST TO YOU DURING YOUR ADMISSION [place]!    Patients discharged on the day of surgery will not be allowed to drive home.  Someone NEEDS to stay with you for the first 24 hours after anesthesia.   Special Instructions: Bring a copy of your healthcare power of attorney and living will documents the day of surgery if you haven't scanned them before.              Please read over the following fact sheets you were given: [facility] [place] [phone]   If you received a COVID test during your pre-op visit  it is requested that you wear a mask when out in public, stay away from anyone that may not be feeling well and notify your surgeon if you  develop symptoms. If you test positive for Covid or have been in contact with anyone that has tested positive in the last 10 days please notify you surgeon.    [facility] - Preparing for Surgery Before surgery, you can play an important role.  Because skin is not sterile, your skin needs to be as free of germs as possible.  You can reduce the number of germs on your skin by washing with CHG (chlorahexidine gluconate) soap before surgery.  CHG is an antiseptic cleaner which kills germs and bonds with the skin to continue killing germs even after washing. Please DO NOT use if you have an allergy to CHG or antibacterial soaps.  If your skin becomes reddened/irritated stop using the CHG and inform your nurse when you arrive at Short Stay. Do not shave (including legs and underarms) for at least 48 hours prior to the first CHG shower.  You may shave your face/neck. Please follow these instructions carefully:  1.  Shower with CHG Soap the night before surgery and the  morning of Surgery.  2.  If you choose to wash your hair, wash your hair first as usual with your  normal  shampoo.  3.  After you shampoo, rinse your hair and body thoroughly to remove the  shampoo.  4.  Use CHG as you would any other liquid soap.  You can apply chg directly  to the skin and wash                       Gently with a scrungie or clean washcloth.  5.  Apply the CHG Soap to your body ONLY FROM THE NECK DOWN.   Do not use on face/ open                           Wound or open sores. Avoid contact with eyes, ears mouth and genitals (private parts).                       Wash face,  Genitals (private parts) with your normal soap.             6.  Wash thoroughly, paying special attention to the area where your surgery  will be performed.  7.  Thoroughly rinse your body with warm water from the neck down.  8.  DO NOT shower/wash with your normal soap after using and rinsing off  the CHG Soap.                 9.  Pat yourself dry with a clean towel.            10.  Wear clean pajamas.            11.  Place clean sheets on your bed the night of your first shower and do not  sleep with pets. Day of Surgery : Do not apply any lotions/deodorants the morning of surgery.  Please wear clean clothes to the hospital/surgery center.  FAILURE TO FOLLOW THESE INSTRUCTIONS MAY RESULT IN THE CANCELLATION OF YOUR SURGERY PATIENT SIGNATURE_________________________________  NURSE SIGNATURE__________________________________  ________________________________________________________________________

## 2022-09-27 ENCOUNTER — Encounter (HOSPITAL_COMMUNITY): Payer: Self-pay

## 2022-09-27 ENCOUNTER — Other Ambulatory Visit: Payer: Self-pay

## 2022-09-27 ENCOUNTER — Encounter (HOSPITAL_COMMUNITY)
Admission: RE | Admit: 2022-09-27 | Discharge: 2022-09-27 | Disposition: A | Discharge status: Home / Self Care | Payer: Medicare HMO | Source: Ambulatory Visit | Attending: Surgery | Admitting: Surgery

## 2022-09-27 VITALS — BP 135/67 | HR 71 | Temp 98.4°F | Resp 16 | Ht 67.5 in | Wt 220.0 lb

## 2022-09-27 DIAGNOSIS — I4891 Unspecified atrial fibrillation: Secondary | ICD-10-CM | POA: Diagnosis not present

## 2022-09-27 DIAGNOSIS — J9383 Other pneumothorax: Secondary | ICD-10-CM | POA: Diagnosis not present

## 2022-09-27 DIAGNOSIS — K8050 Calculus of bile duct without cholangitis or cholecystitis without obstruction: Secondary | ICD-10-CM | POA: Diagnosis not present

## 2022-09-27 DIAGNOSIS — Z7901 Long term (current) use of anticoagulants: Secondary | ICD-10-CM | POA: Insufficient documentation

## 2022-09-27 DIAGNOSIS — Z87891 Personal history of nicotine dependence: Secondary | ICD-10-CM | POA: Insufficient documentation

## 2022-09-27 DIAGNOSIS — I2510 Atherosclerotic heart disease of native coronary artery without angina pectoris: Secondary | ICD-10-CM | POA: Insufficient documentation

## 2022-09-27 DIAGNOSIS — Z85118 Personal history of other malignant neoplasm of bronchus and lung: Secondary | ICD-10-CM | POA: Diagnosis not present

## 2022-09-27 DIAGNOSIS — Z01818 Encounter for other preprocedural examination: Secondary | ICD-10-CM

## 2022-09-27 DIAGNOSIS — E119 Type 2 diabetes mellitus without complications: Secondary | ICD-10-CM | POA: Diagnosis not present

## 2022-09-27 DIAGNOSIS — Z955 Presence of coronary angioplasty implant and graft: Secondary | ICD-10-CM | POA: Diagnosis not present

## 2022-09-27 DIAGNOSIS — J8410 Pulmonary fibrosis, unspecified: Secondary | ICD-10-CM | POA: Insufficient documentation

## 2022-09-27 DIAGNOSIS — Z01812 Encounter for preprocedural laboratory examination: Secondary | ICD-10-CM | POA: Insufficient documentation

## 2022-09-27 HISTORY — DX: Other specified postprocedural states: Z98.890

## 2022-09-27 HISTORY — DX: Other specified postprocedural states: R11.2

## 2022-09-27 HISTORY — DX: Personal history of urinary calculi: Z87.442

## 2022-09-27 LAB — BASIC METABOLIC PANEL
Anion gap: 7 (ref 5–15)
BUN: 21 mg/dL (ref 8–23)
CO2: 28 mmol/L (ref 22–32)
Calcium: 9 mg/dL (ref 8.9–10.3)
Chloride: 107 mmol/L (ref 98–111)
Creatinine, Ser: 0.94 mg/dL (ref 0.44–1.00)
GFR, Estimated: >60 mL/min (ref >60)
Glucose, Bld: 80 mg/dL (ref 70–99)
Potassium: 5.3 mmol/L — ABNORMAL HIGH (ref 3.5–5.1)
Sodium: 142 mmol/L (ref 135–145)

## 2022-09-27 LAB — CBC
HCT: 36.5 % (ref 36.0–46.0)
Hemoglobin: 11.6 g/dL — ABNORMAL LOW (ref 12.0–15.0)
MCH: 31.3 pg (ref 26.0–34.0)
MCHC: 31.8 g/dL (ref 30.0–36.0)
MCV: 98.4 fL (ref 80.0–100.0)
Platelets: 284 10*3/uL (ref 150–400)
RBC: 3.71 MIL/uL — ABNORMAL LOW (ref 3.87–5.11)
RDW: 11.9 % (ref 11.5–15.5)
WBC: 5.4 10*3/uL (ref 4.0–10.5)
nRBC: 0 % (ref 0.0–0.2)

## 2022-09-27 LAB — GLUCOSE, CAPILLARY: Glucose-Capillary: 104 mg/dL — ABNORMAL HIGH (ref 70–99)

## 2022-09-28 LAB — HEMOGLOBIN A1C
Hgb A1c MFr Bld: 6.3 % — ABNORMAL HIGH (ref 4.8–5.6)
Mean Plasma Glucose: 134 mg/dL

## 2022-09-30 NOTE — Anesthesia Preprocedure Evaluation (Addendum)
Anesthesia Evaluation  Patient identified by MRN, date of birth, ID band Patient awake    Reviewed: Allergy & Precautions, NPO status , Patient's Chart, lab work & pertinent test results  History of Anesthesia Complications (+) PONV and history of anesthetic complications  Airway Mallampati: II  TM Distance: >3 FB Neck ROM: Full    Dental no notable dental hx. (+) Upper Dentures, Dental Advisory Given   Pulmonary shortness of breath, former smoker pULM FIBROSIS S/p rul FOR lUNG cA   Pulmonary exam normal breath sounds clear to auscultation       Cardiovascular hypertension, + angina  + Cardiac Stents (stents in 2016), + Peripheral Vascular Disease and +CHF  Normal cardiovascular exam+ dysrhythmias Atrial Fibrillation  Rhythm:Regular Rate:Normal  11/2021 TTE    1. Compared to 11/26/21 pericardial effusion is larger (previously appeared  trivial; now small).   2. Left ventricular ejection fraction, by estimation, is 60 to 65%. The  left ventricle has normal function. The left ventricle has no regional  wall motion abnormalities. Left ventricular diastolic parameters are  consistent with Grade II diastolic  dysfunction (pseudonormalization).   3. Right ventricular systolic function is normal. The right ventricular  size is normal.   4. A small pericardial effusion is present.   5. The mitral valve is normal in structure. No evidence of mitral valve  regurgitation. No evidence of mitral stenosis.   6. The aortic valve is tricuspid. Aortic valve regurgitation is mild to  moderate. No aortic stenosis is present.   7. Aortic dilatation noted. There is borderline dilatation of the  ascending aorta, measuring 38 mm.   8. The inferior vena cava is normal in size with greater than 50%  respiratory variability, suggesting right atrial pressure of 3 mmHg.      Neuro/Psych  Neuromuscular disease    GI/Hepatic Neg liver ROS,,,   Endo/Other  diabetes, Type 2Hypothyroidism    Renal/GU      Musculoskeletal  (+) Arthritis ,    Abdominal   Peds  Hematology   Anesthesia Other Findings All: Crstor Invokana, Metformin, lisinopril, morphine  Reproductive/Obstetrics                             Anesthesia Physical Anesthesia Plan  ASA: 3  Anesthesia Plan: General   Post-op Pain Management: Toradol IV (intra-op)*, Tylenol PO (pre-op)* and Precedex   Induction: Intravenous, Rapid sequence and Cricoid pressure planned  PONV Risk Score and Plan: 3 and Treatment may vary due to age or medical condition, Ondansetron, Dexamethasone and Midazolam  Airway Management Planned: Oral ETT  Additional Equipment: None  Intra-op Plan:   Post-operative Plan: Extubation in OR  Informed Consent: I have reviewed the patients History and Physical, chart, labs and discussed the procedure including the risks, benefits and alternatives for the proposed anesthesia with the patient or authorized representative who has indicated his/her understanding and acceptance.     Dental advisory given  Plan Discussed with: CRNA, Anesthesiologist and Surgeon  Anesthesia Plan Comments: (See PAT note 09/27/2022  pT ON oZEMPIC Q wEEK 12/11)       Anesthesia Quick Evaluation

## 2022-09-30 NOTE — Progress Notes (Signed)
Anesthesia Chart Review   Case: [id number] Date/Time: 10/01/22 1329   Procedure: LAPAROSCOPIC CHOLECYSTECTOMY   Anesthesia type: General   Pre-op diagnosis: BILIARY COLIC   Location: [facility] 08 / [facility]   Surgeons: Clovis Riley, MD       DISCUSSION:66 y.o. former smoker with h/o PONV, DM II, right upper lobectomy due to spontaneous pneumothorax/pleural abrasions in 2007, lung cancer 2001 s/p chemo/radiation/left upper lobectomy, pulmonary fibrosis, CAD, atrial fibrillation, biliary colic scheduled for above procedure 10/01/2022 with Dr. Romana Juniper.   Pt experiences intermittent hemoptysis.  Followed by pulmonology.  Last seen 07/18/2022. Per OV note, "Intermittent episodes of recurrent hemoptysis. Has had an extensive work-up with laryngoscopy and bronchoscopy. Felt to be attributable to chronic Eliquis use with friable mucosa. Patient's had no significant increase in episodes. Recent CT scan showed no evidence of recurrent disease. We will continue with yearly serial follow-up."  Per cardiology note 09/09/2022, "Chart reviewed as part of pre-operative protocol coverage. Given past medical history and time since last visit, based on ACC/AHA guidelines, [name] would be at acceptable risk for the planned procedure without further cardiovascular testing.    Patient with diagnosis of afib on Eliquis for anticoagulation.   Procedure: lap chole surgery Date of procedure: TBD Per office protocol, patient can hold Eliquis for 3 days prior to procedure"  Anticipate pt can proceed with planned procedure barring acute status change.   VS: BP 135/67   Pulse 71   Temp 36.9 C (Oral)   Resp 16   Ht 5' 7.5" (1.715 m)   Wt 99.8 kg   SpO2 100%   BMI 33.95 kg/m   PROVIDERS: Mckinley Jewel, MD is PCP   Primary Cardiologist: Sinclair Grooms, MD  LABS: Labs reviewed: Acceptable for surgery. (all labs ordered are listed, but only abnormal results are displayed)  Labs Reviewed   HEMOGLOBIN A1C - Abnormal; Notable for the following components:      Result Value   Hgb A1c MFr Bld 6.3 (*)    All other components within normal limits  BASIC METABOLIC PANEL - Abnormal; Notable for the following components:   Potassium 5.3 (*)    All other components within normal limits  CBC - Abnormal; Notable for the following components:   RBC 3.71 (*)    Hemoglobin 11.6 (*)    All other components within normal limits  GLUCOSE, CAPILLARY - Abnormal; Notable for the following components:   Glucose-Capillary 104 (*)    All other components within normal limits     IMAGES:   EKG:   CV: Echo 07/18/2022 1. Compared to 11/26/21 pericardial effusion is larger (previously appeared  trivial; now small).   2. Left ventricular ejection fraction, by estimation, is 60 to 65%. The  left ventricle has normal function. The left ventricle has no regional  wall motion abnormalities. Left ventricular diastolic parameters are  consistent with Grade II diastolic  dysfunction (pseudonormalization).   3. Right ventricular systolic function is normal. The right ventricular  size is normal.   4. A small pericardial effusion is present.   5. The mitral valve is normal in structure. No evidence of mitral valve  regurgitation. No evidence of mitral stenosis.   6. The aortic valve is tricuspid. Aortic valve regurgitation is mild to  moderate. No aortic stenosis is present.   7. Aortic dilatation noted. There is borderline dilatation of the  ascending aorta, measuring 38 mm.   8. The inferior vena cava  is normal in size with greater than 50%  respiratory variability, suggesting right atrial pressure of 3 mmHg.   Myocardial Perfusion 02/20/2022   The study is normal. The study is low risk.   No ST deviation was noted.   Left ventricular function is normal. Nuclear stress EF: 63 %. The left ventricular ejection fraction is normal (55-65%). End diastolic cavity size is normal.   Prior study  available for comparison from 09/27/2016. Past Medical History:  Diagnosis Date   Allergy    Anemia    Anemia    Aortic insufficiency    a. Mod by echo 05/2015.   Arthritis    "knees" (07/11/2015)   Atrial fibrillation (HCC)    On Eliquis   Carcinoma of hilus of lung ([place]) 01/20/2012   IIIB NSCL  Right hilar mass compressing esophagus/presenting with dysphagia Rx Surgery/RT/chemo dx January 2001   Coronary artery disease    a. Abnormal stress test -> [facility] 06/2015 s/p DES to mCX and mLAD, residual D2 disease treated medially.   Diverticulitis    Elevated blood pressure    Erythema nodosum    Heart murmur    History of kidney stones    Insomnia    lung ca dx'd 10/1999   chemo/xrt comp 05/29/2000   Myalgia    Obesity    Pericardial effusion    a. 05/2015 Echo: EF 55-60%, no rwma, Gr 1 DD, no effusion - but an effusion was seen on CT which was felt to be increased in size compared to 12/2014 - pericardium also thickened.    Pericarditis    a. Dx 05/2015.   Pneumothorax, spontaneous, tension 01/20/2012   October, 2010   PONV (postoperative nausea and vomiting)    Pulmonary fibrosis ([place]) 01/20/2012   Due to previous surgery and chest radiation for lung cancer   Restrictive lung disease    Type II diabetes mellitus [facility])     Past Surgical History:  Procedure Laterality Date   ABDOMINAL HYSTERECTOMY  10/21/2006   APPENDECTOMY  1969?   ATRIAL FIBRILLATION ABLATION N/A 02/27/2022   Procedure: ATRIAL FIBRILLATION ABLATION;  Surgeon: Constance Haw, MD;  Location: [place] [facility];  Service: Cardiovascular;  Laterality: N/A;   BRONCHIAL WASHINGS  08/03/2021   Procedure: BRONCHIAL WASHINGS;  Surgeon: Rigoberto Noel, MD;  Location: [place];  Service: Cardiopulmonary;;   CARDIAC CATHETERIZATION N/A 07/11/2015   Procedure: Left Heart Cath and Coronary Angiography;  Surgeon: Jettie Booze, MD;  Location: [place] [facility];  Service: Cardiovascular;  Laterality: N/A;   CARDIAC  CATHETERIZATION  07/11/2015   Procedure: Coronary Stent Intervention;  Surgeon: Jettie Booze, MD;  Location: [place] [facility];  Service: Cardiovascular;;   CARPAL TUNNEL RELEASE Bilateral 1998-2005?   right-left   CARPAL TUNNEL RELEASE Left 2009?   "for the 2nd time"   COLONOSCOPY     COLONOSCOPY WITH PROPOFOL N/A 05/28/2018   Procedure: COLONOSCOPY WITH PROPOFOL;  Surgeon: Milus Banister, MD;  Location: [facility];  Service: Endoscopy;  Laterality: N/A;   CORONARY ANGIOPLASTY     CYSTOSCOPY W/ STONE MANIPULATION  10/22/2011   ESOPHAGOGASTRODUODENOSCOPY (EGD) WITH ESOPHAGEAL DILATION  10/21/2010   FOOT SURGERY Bilateral 06/22/1979   Callous removed    INNER EAR SURGERY Right ~ 2009   KNEE ARTHROPLASTY Right 10/21/1989   KNEE ARTHROSCOPY  ~ 2000   LATERAL RELEASE   LEFT HEART CATH AND CORONARY ANGIOGRAPHY N/A 12/18/2016   Procedure: Left Heart Cath and Coronary  Angiography;  Surgeon: Burnell Blanks, MD;  Location: [place] [facility];  Service: Cardiovascular;  Laterality: N/A;   LEFT HEART CATH AND CORONARY ANGIOGRAPHY N/A 02/01/2021   Procedure: LEFT HEART CATH AND CORONARY ANGIOGRAPHY;  Surgeon: Leonie Man, MD;  Location: [place] [facility];  Service: Cardiovascular;  Laterality: N/A;   LUNG CANCER SURGERY Left 10/22/1999   "small cell"   LUNG SURGERY Right 10/21/2005   "reinflated it"   PULMONARY EMBOLISM SURGERY  10/21/2005   LUNG COLLAPSE   RIGHT AND LEFT HEART CATH N/A 06/25/2017   Procedure: RIGHT AND LEFT HEART CATH;  Surgeon: Sherren Mocha, MD;  Location: [place] [facility];  Service: Cardiovascular;  Laterality: N/A;   SHOULDER ADHESION RELEASE Right ~ 2004   SHOULDER ARTHROSCOPY W/ ROTATOR CUFF REPAIR Right ~ 2003   UPPER GASTROINTESTINAL ENDOSCOPY  06/05/2021   VIDEO BRONCHOSCOPY N/A 08/03/2021   Procedure: VIDEO BRONCHOSCOPY WITHOUT FLUORO;  Surgeon: Rigoberto Noel, MD;  Location: [place];  Service: Cardiopulmonary;  Laterality: N/A;     MEDICATIONS:  ACCU-CHEK GUIDE test strip   Accu-Chek Softclix Lancets lancets   acetaminophen (TYLENOL) 650 MG CR tablet   apixaban (ELIQUIS) 5 MG TABS tablet   B-D UF III MINI PEN NEEDLES 31G X 5 MM MISC   Blood Glucose Monitoring Suppl (ACCU-CHEK GUIDE) w/Device KIT   co-enzyme Q-10 50 MG capsule   diltiazem (CARDIZEM CD) 360 MG 24 hr capsule   dofetilide (TIKOSYN) 500 MCG capsule   furosemide (LASIX) 40 MG tablet   levothyroxine (SYNTHROID, LEVOTHROID) 50 MCG tablet   losartan (COZAAR) 25 MG tablet   Menthol, Topical Analgesic, (BIOFREEZE EX)   metoprolol succinate (TOPROL-XL) 50 MG 24 hr tablet   nitroGLYCERIN (NITROSTAT) 0.4 MG SL tablet   NOVOLOG MIX 70/30 FLEXPEN (70-30) 100 UNIT/ML FlexPen   OZEMPIC, 0.25 OR 0.5 MG/DOSE, 2 MG/1.5ML SOPN   pantoprazole (PROTONIX) 40 MG tablet   pravastatin (PRAVACHOL) 20 MG tablet   spironolactone (ALDACTONE) 25 MG tablet   traMADol (ULTRAM) 50 MG tablet   No current facility-administered medications for this encounter.    Konrad Felix Ward, PA-C [facility] [phone]

## 2022-10-01 ENCOUNTER — Ambulatory Visit (HOSPITAL_BASED_OUTPATIENT_CLINIC_OR_DEPARTMENT_OTHER): Payer: Medicare HMO | Admitting: Anesthesiology

## 2022-10-01 ENCOUNTER — Ambulatory Visit (HOSPITAL_COMMUNITY): Payer: Medicare HMO | Admitting: Physician Assistant

## 2022-10-01 ENCOUNTER — Encounter (HOSPITAL_COMMUNITY): Payer: Self-pay | Admitting: Surgery

## 2022-10-01 ENCOUNTER — Ambulatory Visit (HOSPITAL_COMMUNITY)
Admission: RE | Admit: 2022-10-01 | Discharge: 2022-10-01 | Disposition: A | Discharge status: Home / Self Care | Payer: Medicare HMO | Attending: Surgery | Admitting: Surgery

## 2022-10-01 ENCOUNTER — Encounter (HOSPITAL_COMMUNITY)
Admission: RE | Disposition: A | Discharge status: Home / Self Care | Payer: Self-pay | Source: Home / Self Care | Attending: Surgery

## 2022-10-01 ENCOUNTER — Other Ambulatory Visit: Payer: Self-pay

## 2022-10-01 DIAGNOSIS — Z794 Long term (current) use of insulin: Secondary | ICD-10-CM | POA: Insufficient documentation

## 2022-10-01 DIAGNOSIS — E669 Obesity, unspecified: Secondary | ICD-10-CM | POA: Insufficient documentation

## 2022-10-01 DIAGNOSIS — I509 Heart failure, unspecified: Secondary | ICD-10-CM | POA: Diagnosis not present

## 2022-10-01 DIAGNOSIS — K8050 Calculus of bile duct without cholangitis or cholecystitis without obstruction: Secondary | ICD-10-CM

## 2022-10-01 DIAGNOSIS — G4730 Sleep apnea, unspecified: Secondary | ICD-10-CM | POA: Insufficient documentation

## 2022-10-01 DIAGNOSIS — E119 Type 2 diabetes mellitus without complications: Secondary | ICD-10-CM | POA: Diagnosis not present

## 2022-10-01 DIAGNOSIS — E039 Hypothyroidism, unspecified: Secondary | ICD-10-CM | POA: Diagnosis not present

## 2022-10-01 DIAGNOSIS — Z87891 Personal history of nicotine dependence: Secondary | ICD-10-CM | POA: Insufficient documentation

## 2022-10-01 DIAGNOSIS — Z01818 Encounter for other preprocedural examination: Secondary | ICD-10-CM

## 2022-10-01 DIAGNOSIS — Z955 Presence of coronary angioplasty implant and graft: Secondary | ICD-10-CM | POA: Diagnosis not present

## 2022-10-01 DIAGNOSIS — I110 Hypertensive heart disease with heart failure: Secondary | ICD-10-CM | POA: Insufficient documentation

## 2022-10-01 DIAGNOSIS — Z6835 Body mass index (BMI) 35.0-35.9, adult: Secondary | ICD-10-CM | POA: Diagnosis not present

## 2022-10-01 DIAGNOSIS — Z85118 Personal history of other malignant neoplasm of bronchus and lung: Secondary | ICD-10-CM | POA: Diagnosis not present

## 2022-10-01 DIAGNOSIS — I5032 Chronic diastolic (congestive) heart failure: Secondary | ICD-10-CM | POA: Diagnosis not present

## 2022-10-01 DIAGNOSIS — I739 Peripheral vascular disease, unspecified: Secondary | ICD-10-CM | POA: Insufficient documentation

## 2022-10-01 DIAGNOSIS — I480 Paroxysmal atrial fibrillation: Secondary | ICD-10-CM | POA: Diagnosis not present

## 2022-10-01 DIAGNOSIS — Z7901 Long term (current) use of anticoagulants: Secondary | ICD-10-CM | POA: Insufficient documentation

## 2022-10-01 DIAGNOSIS — K828 Other specified diseases of gallbladder: Secondary | ICD-10-CM | POA: Diagnosis not present

## 2022-10-01 DIAGNOSIS — I2510 Atherosclerotic heart disease of native coronary artery without angina pectoris: Secondary | ICD-10-CM | POA: Diagnosis not present

## 2022-10-01 HISTORY — PX: CHOLECYSTECTOMY: SHX55

## 2022-10-01 LAB — GLUCOSE, CAPILLARY: Glucose-Capillary: 104 mg/dL — ABNORMAL HIGH (ref 70–99)

## 2022-10-01 SURGERY — LAPAROSCOPIC CHOLECYSTECTOMY
Anesthesia: General

## 2022-10-01 MED ORDER — CHLORHEXIDINE GLUCONATE 4 % EX LIQD: 60.0000 mL | Freq: Once | CUTANEOUS | Status: DC

## 2022-10-01 MED ORDER — SODIUM CHLORIDE 0.9% FLUSH: 3.0000 mL | Freq: Two times a day (BID) | INTRAVENOUS | Status: DC

## 2022-10-01 MED ORDER — MIDAZOLAM HCL 5 MG/5ML IJ SOLN
INTRAMUSCULAR | Status: DC | PRN
  Administered 2022-10-01: 2 mg via INTRAVENOUS

## 2022-10-01 MED ORDER — PHENYLEPHRINE 80 MCG/ML (10ML) SYRINGE FOR IV PUSH (FOR BLOOD PRESSURE SUPPORT)
PREFILLED_SYRINGE | INTRAVENOUS | Status: AC
  Filled 2022-10-01: qty 10

## 2022-10-01 MED ORDER — DOCUSATE SODIUM 100 MG PO CAPS: 100.0000 mg | ORAL_CAPSULE | Freq: Two times a day (BID) | ORAL | 0 refills | Status: AC

## 2022-10-01 MED ORDER — OXYCODONE HCL 5 MG PO TABS: 5.0000 mg | ORAL_TABLET | Freq: Once | ORAL | Status: DC | PRN

## 2022-10-01 MED ORDER — FENTANYL CITRATE (PF) 250 MCG/5ML IJ SOLN
INTRAMUSCULAR | Status: AC
  Filled 2022-10-01: qty 5

## 2022-10-01 MED ORDER — INDOCYANINE GREEN 25 MG IV SOLR
7.5000 mg | Freq: Once | INTRAVENOUS | Status: AC
  Administered 2022-10-01: 7.5 mg via INTRAVENOUS
  Filled 2022-10-01: qty 10

## 2022-10-01 MED ORDER — PHENYLEPHRINE HCL-NACL 20-0.9 MG/250ML-% IV SOLN
INTRAVENOUS | Status: DC | PRN
  Administered 2022-10-01: 30 ug/min via INTRAVENOUS

## 2022-10-01 MED ORDER — OXYCODONE HCL 5 MG/5ML PO SOLN: 5.0000 mg | Freq: Once | ORAL | Status: DC | PRN

## 2022-10-01 MED ORDER — OXYCODONE HCL 5 MG PO TABS
ORAL_TABLET | ORAL | Status: AC
  Filled 2022-10-01: qty 2

## 2022-10-01 MED ORDER — BUPIVACAINE LIPOSOME 1.3 % IJ SUSP: 20.0000 mL | Freq: Once | INTRAMUSCULAR | Status: DC

## 2022-10-01 MED ORDER — ACETAMINOPHEN 650 MG RE SUPP: 650.0000 mg | RECTAL | Status: DC | PRN

## 2022-10-01 MED ORDER — LIDOCAINE 2% (20 MG/ML) 5 ML SYRINGE
INTRAMUSCULAR | Status: DC | PRN
  Administered 2022-10-01: 100 mg via INTRAVENOUS

## 2022-10-01 MED ORDER — SODIUM CHLORIDE 0.9 % IV SOLN: 250.0000 mL | INTRAVENOUS | Status: DC | PRN

## 2022-10-01 MED ORDER — TRAMADOL HCL 50 MG PO TABS: 50.0000 mg | ORAL_TABLET | Freq: Four times a day (QID) | ORAL | 0 refills | Status: AC | PRN

## 2022-10-01 MED ORDER — LACTATED RINGERS IV SOLN: INTRAVENOUS | Status: DC

## 2022-10-01 MED ORDER — SUGAMMADEX SODIUM 200 MG/2ML IV SOLN
INTRAVENOUS | Status: DC | PRN
  Administered 2022-10-01: 200 mg via INTRAVENOUS

## 2022-10-01 MED ORDER — KETOROLAC TROMETHAMINE 30 MG/ML IJ SOLN
INTRAMUSCULAR | Status: AC
  Filled 2022-10-01: qty 1

## 2022-10-01 MED ORDER — SUCCINYLCHOLINE CHLORIDE 200 MG/10ML IV SOSY
PREFILLED_SYRINGE | INTRAVENOUS | Status: DC | PRN
  Administered 2022-10-01: 100 mg via INTRAVENOUS

## 2022-10-01 MED ORDER — ONDANSETRON HCL 4 MG/2ML IJ SOLN: 4.0000 mg | Freq: Once | INTRAMUSCULAR | Status: DC | PRN

## 2022-10-01 MED ORDER — SODIUM CHLORIDE 0.9% FLUSH: 3.0000 mL | INTRAVENOUS | Status: DC | PRN

## 2022-10-01 MED ORDER — PHENYLEPHRINE 80 MCG/ML (10ML) SYRINGE FOR IV PUSH (FOR BLOOD PRESSURE SUPPORT)
PREFILLED_SYRINGE | INTRAVENOUS | Status: DC | PRN
  Administered 2022-10-01: 120 ug via INTRAVENOUS
  Administered 2022-10-01 (×2): 200 ug via INTRAVENOUS

## 2022-10-01 MED ORDER — BUPIVACAINE HCL 0.25 % IJ SOLN
INTRAMUSCULAR | Status: DC | PRN
  Administered 2022-10-01: 30 mL

## 2022-10-01 MED ORDER — ROCURONIUM BROMIDE 10 MG/ML (PF) SYRINGE
PREFILLED_SYRINGE | INTRAVENOUS | Status: DC | PRN
  Administered 2022-10-01: 50 mg via INTRAVENOUS

## 2022-10-01 MED ORDER — ONDANSETRON HCL 4 MG/2ML IJ SOLN
INTRAMUSCULAR | Status: DC | PRN
  Administered 2022-10-01: 4 mg via INTRAVENOUS

## 2022-10-01 MED ORDER — ACETAMINOPHEN 500 MG PO TABS
1000.0000 mg | ORAL_TABLET | ORAL | Status: AC
  Administered 2022-10-01: 1000 mg via ORAL
  Filled 2022-10-01: qty 2

## 2022-10-01 MED ORDER — HYDROMORPHONE HCL 1 MG/ML IJ SOLN: 0.2500 mg | INTRAMUSCULAR | Status: DC | PRN

## 2022-10-01 MED ORDER — DEXAMETHASONE SODIUM PHOSPHATE 4 MG/ML IJ SOLN
INTRAMUSCULAR | Status: DC | PRN
  Administered 2022-10-01: 5 mg via INTRAVENOUS

## 2022-10-01 MED ORDER — MIDAZOLAM HCL 2 MG/2ML IJ SOLN
INTRAMUSCULAR | Status: AC
  Filled 2022-10-01: qty 2

## 2022-10-01 MED ORDER — KETOROLAC TROMETHAMINE 30 MG/ML IJ SOLN
30.0000 mg | Freq: Once | INTRAMUSCULAR | Status: AC | PRN
  Administered 2022-10-01: 30 mg via INTRAVENOUS

## 2022-10-01 MED ORDER — FENTANYL CITRATE PF 50 MCG/ML IJ SOSY
25.0000 ug | PREFILLED_SYRINGE | INTRAMUSCULAR | Status: DC | PRN
  Administered 2022-10-01: 50 ug via INTRAVENOUS

## 2022-10-01 MED ORDER — GABAPENTIN 300 MG PO CAPS
300.0000 mg | ORAL_CAPSULE | ORAL | Status: AC
  Administered 2022-10-01: 300 mg via ORAL
  Filled 2022-10-01: qty 1

## 2022-10-01 MED ORDER — LACTATED RINGERS IV SOLN
INTRAVENOUS | Status: DC | PRN
  Administered 2022-10-01: 1000 mL

## 2022-10-01 MED ORDER — DEXMEDETOMIDINE HCL IN NACL 80 MCG/20ML IV SOLN
INTRAVENOUS | Status: DC | PRN
  Administered 2022-10-01: 8 ug via BUCCAL

## 2022-10-01 MED ORDER — BUPIVACAINE HCL (PF) 0.25 % IJ SOLN
INTRAMUSCULAR | Status: AC
  Filled 2022-10-01: qty 30

## 2022-10-01 MED ORDER — PROPOFOL 10 MG/ML IV BOLUS
INTRAVENOUS | Status: DC | PRN
  Administered 2022-10-01: 140 mg via INTRAVENOUS

## 2022-10-01 MED ORDER — CHLORHEXIDINE GLUCONATE 0.12 % MT SOLN
15.0000 mL | Freq: Once | OROMUCOSAL | Status: AC
  Administered 2022-10-01: 15 mL via OROMUCOSAL

## 2022-10-01 MED ORDER — SUCCINYLCHOLINE CHLORIDE 200 MG/10ML IV SOSY
PREFILLED_SYRINGE | INTRAVENOUS | Status: AC
  Filled 2022-10-01: qty 10

## 2022-10-01 MED ORDER — FENTANYL CITRATE PF 50 MCG/ML IJ SOSY
PREFILLED_SYRINGE | INTRAMUSCULAR | Status: AC
  Filled 2022-10-01: qty 1

## 2022-10-01 MED ORDER — OXYCODONE HCL 5 MG PO TABS
5.0000 mg | ORAL_TABLET | ORAL | Status: DC | PRN
  Administered 2022-10-01: 10 mg via ORAL

## 2022-10-01 MED ORDER — FENTANYL CITRATE (PF) 100 MCG/2ML IJ SOLN
INTRAMUSCULAR | Status: DC | PRN
  Administered 2022-10-01: 50 ug via INTRAVENOUS
  Administered 2022-10-01: 100 ug via INTRAVENOUS
  Administered 2022-10-01 (×2): 50 ug via INTRAVENOUS

## 2022-10-01 MED ORDER — ORAL CARE MOUTH RINSE: 15.0000 mL | Freq: Once | OROMUCOSAL | Status: AC

## 2022-10-01 MED ORDER — CEFAZOLIN SODIUM-DEXTROSE 2-4 GM/100ML-% IV SOLN
2.0000 g | INTRAVENOUS | Status: AC
  Administered 2022-10-01: 2 g via INTRAVENOUS
  Filled 2022-10-01: qty 100

## 2022-10-01 MED ORDER — ACETAMINOPHEN 325 MG PO TABS: 650.0000 mg | ORAL_TABLET | ORAL | Status: DC | PRN

## 2022-10-01 MED ORDER — 0.9 % SODIUM CHLORIDE (POUR BTL) OPTIME
TOPICAL | Status: DC | PRN
  Administered 2022-10-01: 1000 mL

## 2022-10-01 SURGICAL SUPPLY — 46 items
ADH SKN CLS APL DERMABOND .7 (GAUZE/BANDAGES/DRESSINGS) ×1
APL PRP STRL LF DISP 70% ISPRP (MISCELLANEOUS) ×1
APPLIER CLIP ROT 10 11.4 M/L (STAPLE) ×1
APR CLP MED LRG 11.4X10 (STAPLE) ×1
BAG COUNTER SPONGE SURGICOUNT (BAG) IMPLANT
BAG SPNG CNTER NS LX DISP (BAG) ×1
CABLE HIGH FREQUENCY MONO STRZ (ELECTRODE) ×2 IMPLANT
CHLORAPREP W/TINT 26 (MISCELLANEOUS) ×2 IMPLANT
CLIP APPLIE ROT 10 11.4 M/L (STAPLE) ×2 IMPLANT
CNTNR URN SCR LID CUP LEK RST (MISCELLANEOUS) IMPLANT
CONT SPEC 4OZ STRL OR WHT (MISCELLANEOUS) ×1
COVER MAYO STAND STRL (DRAPES) IMPLANT
COVER SURGICAL LIGHT HANDLE (MISCELLANEOUS) ×2 IMPLANT
DERMABOND ADVANCED .7 DNX12 (GAUZE/BANDAGES/DRESSINGS) ×2 IMPLANT
DRAPE C-ARM 42X120 X-RAY (DRAPES) IMPLANT
ELECT REM PT RETURN 15FT ADLT (MISCELLANEOUS) ×2 IMPLANT
GLOVE BIO SURGEON STRL SZ 6 (GLOVE) ×2 IMPLANT
GLOVE INDICATOR 6.5 STRL GRN (GLOVE) ×2 IMPLANT
GLOVE SS BIOGEL STRL SZ 6 (GLOVE) ×2 IMPLANT
GOWN STRL REUS W/ TWL LRG LVL3 (GOWN DISPOSABLE) ×2 IMPLANT
GOWN STRL REUS W/ TWL XL LVL3 (GOWN DISPOSABLE) IMPLANT
GOWN STRL REUS W/TWL LRG LVL3 (GOWN DISPOSABLE) ×1
GOWN STRL REUS W/TWL XL LVL3 (GOWN DISPOSABLE)
GRASPER SUT TROCAR 14GX15 (MISCELLANEOUS) ×2 IMPLANT
HEMOSTAT SNOW SURGICEL 2X4 (HEMOSTASIS) IMPLANT
IRRIG SUCT STRYKERFLOW 2 WTIP (MISCELLANEOUS) ×1
IRRIGATION SUCT STRKRFLW 2 WTP (MISCELLANEOUS) ×2 IMPLANT
KIT BASIN OR (CUSTOM PROCEDURE TRAY) ×2 IMPLANT
KIT TURNOVER KIT A (KITS) IMPLANT
NDL INSUFFLATION 14GA 120MM (NEEDLE) ×2 IMPLANT
NEEDLE INSUFFLATION 14GA 120MM (NEEDLE) ×1 IMPLANT
PENCIL SMOKE EVACUATOR (MISCELLANEOUS) IMPLANT
SCISSORS LAP 5X35 DISP (ENDOMECHANICALS) ×2 IMPLANT
SET CHOLANGIOGRAPH MIX (MISCELLANEOUS) IMPLANT
SET TUBE SMOKE EVAC HIGH FLOW (TUBING) ×2 IMPLANT
SLEEVE Z-THREAD 5X100MM (TROCAR) ×2 IMPLANT
SPIKE FLUID TRANSFER (MISCELLANEOUS) ×2 IMPLANT
SUT MNCRL AB 4-0 PS2 18 (SUTURE) ×2 IMPLANT
SYS BAG RETRIEVAL 10MM (BASKET) ×1
SYSTEM BAG RETRIEVAL 10MM (BASKET) ×2 IMPLANT
TOWEL OR 17X26 10 PK STRL BLUE (TOWEL DISPOSABLE) ×2 IMPLANT
TOWEL OR NON WOVEN STRL DISP B (DISPOSABLE) IMPLANT
TRAY LAPAROSCOPIC (CUSTOM PROCEDURE TRAY) ×2 IMPLANT
TROCAR ADV FIXATION 12X100MM (TROCAR) ×2 IMPLANT
TROCAR XCEL NON-BLD 5MMX100MML (ENDOMECHANICALS) IMPLANT
TROCAR Z-THREAD OPTICAL 5X100M (TROCAR) ×2 IMPLANT

## 2022-10-01 NOTE — Transfer of Care (Signed)
Immediate Anesthesia Transfer of Care Note  Patient: ZAREA DIESING  Procedure(s) Performed: LAPAROSCOPIC CHOLECYSTECTOMY  Patient Location: PACU  Anesthesia Type:General  Level of Consciousness: awake and patient cooperative  Airway & Oxygen Therapy: Patient Spontanous Breathing and Patient connected to face mask  Post-op Assessment: Report given to RN and Post -op Vital signs reviewed and stable  Post vital signs: Reviewed and stable  Last Vitals:  Vitals Value Taken Time  BP    Temp    Pulse    Resp    SpO2      Last Pain:  Vitals:   10/01/22 1134  TempSrc:   PainSc: 6       Patients Stated Pain Goal: 0 (04/13/75 2831)  Complications: No notable events documented.

## 2022-10-01 NOTE — Op Note (Signed)
Operative Note  [name] 66 y.o. female [id number]  10/01/2022  Surgeon: Clovis Riley MD FACS I performed the key and critical portions of this procedure and was scrubbed in throughout the entire procedure, as documented in my operative note.   Assistant: Sheldon Silvan MD (PGY3)  Procedure performed: Laparoscopic Cholecystectomy  Preop diagnosis: biliary colic Post-op diagnosis/intraop findings: same  Specimens: gallbladder  Retained items: none  EBL: minimal  Complications: none  Description of procedure: After obtaining informed consent the patient was brought to the operating room. Antibiotics were administered. SCD's were applied. General endotracheal anesthesia was initiated and a formal time-out was performed. The abdomen was prepped and draped in the usual sterile fashion and the abdomen was entered using  a left subcostal veress needle  after instilling the site with local. Insufflation to 41mmHg was obtained, optical entry right subcostal 21mm trocar and camera inserted, and gross inspection revealed no evidence of injury from our entry or other intraabdominal abnormalities. Two 70mm trocars were introduced in the supraumbilical and right anterior axillary lines under direct visualization and following infiltration with local. An 73mm trocar was placed in the epigastrium. The gallbladder was retracted cephalad and the infundibulum was retracted laterally. A combination of hook electrocautery and blunt dissection was utilized to clear the peritoneum from the neck and cystic duct, circumferentially isolating the cystic artery and cystic duct and lifting the gallbladder from the cystic plate. The critical view of safety was achieved with the cystic artery, cystic duct, and liver bed visualized between them with no other structures. The artery was clipped with a single clip proximally and distally and divided as was the cystic duct with four clips on the proximal end. The  gallbladder was dissected from the liver plate using electrocautery. Once freed the gallbladder was placed in an endocatch bag and removed through the epigastric trocar site. A minimal amount of bleeding on the liver bed was controlled with cautery. Some bile had been spilled from the gallbladder fundus during its dissection from the liver bed. This was aspirated and the right upper quadrant was irrigated copiously until the effluent was clear. Hemostasis was once again confirmed, and reinspection of the abdomen revealed no injuries. The clips were well opposed without any bile leak from the duct or the liver bed. The 17mm trocar site in the epigastrium was closed with a 0 vicryl in the fascia under direct visualization using a PMI device. The abdomen was desufflated and all trocars removed. The skin incisions were closed with subcuticular 4-0 monocryl and Dermabond. The patient was awakened, extubated and transported to the recovery room in stable condition.    All counts were correct at the completion of the case.

## 2022-10-01 NOTE — Anesthesia Postprocedure Evaluation (Signed)
Anesthesia Post Note  Patient: [name]  Procedure(s) Performed: LAPAROSCOPIC CHOLECYSTECTOMY     Patient location during evaluation: PACU Anesthesia Type: General Level of consciousness: awake and alert Pain management: pain level controlled Vital Signs Assessment: post-procedure vital signs reviewed and stable Respiratory status: spontaneous breathing, nonlabored ventilation, respiratory function stable and patient connected to nasal cannula oxygen Cardiovascular status: blood pressure returned to baseline and stable Postop Assessment: no apparent nausea or vomiting Anesthetic complications: no  No notable events documented.  Last Vitals:  Vitals:   10/01/22 1500 10/01/22 1515  BP: 128/67 126/66  Pulse: 74 72  Resp: 20 20  Temp: 36.6 C   SpO2: 99% 98%    Last Pain:  Vitals:   10/01/22 1500  TempSrc:   PainSc: 0-No pain                 Barnet Glasgow

## 2022-10-01 NOTE — Discharge Instructions (Signed)
LAPAROSCOPIC SURGERY: POST OP INSTRUCTIONS   EAT Gradually transition to a high fiber diet with a fiber supplement over the next few weeks after discharge.  Start with a pureed / full liquid diet (see below)  WALK Walk an hour a day (cumulative- not all at once).  Control your pain to do that.    CONTROL PAIN Control pain so that you can walk, sleep, tolerate sneezing/coughing, go up/down stairs.  HAVE A BOWEL MOVEMENT DAILY Keep your bowels regular to avoid problems.  OK to try a laxative to override constipation.  OK to use an antidairrheal to slow down diarrhea.  Call if not better after 2 tries  CALL IF YOU HAVE PROBLEMS/CONCERNS Call if you are still struggling despite following these instructions. Call if you have concerns not answered by these instructions    DIET: Follow a light bland diet & liquids the first 24 hours after arrival home, such as soup, liquids, starches, etc.  Be sure to drink plenty of fluids.  Quickly advance to a usual solid diet within a few days.  Avoid fast food or heavy meals as your are more likely to get nauseated or have irregular bowels.  A low-sugar, high-fiber diet for the rest of your life is ideal.  Take your usually prescribed home medications unless otherwise directed.  PAIN CONTROL: Pain is best controlled by a usual combination of three different methods TOGETHER: Ice/Heat Over the counter pain medication Prescription pain medication Most patients will experience some swelling and bruising around the incisions.  Ice packs or heating pads (30-60 minutes up to 6 times a day) will help. Use ice for the first few days to help decrease swelling and bruising, then switch to heat to help relax tight/sore spots and speed recovery.  Some people prefer to use ice alone, heat alone, alternating between ice & heat.  Experiment to what works for you.  Swelling and bruising can take several weeks to resolve.   It is helpful to take an over-the-counter pain  medication regularly for the first few days: Naproxen (Aleve, etc)  Two 220mg  tabs twice a day OR Ibuprofen (Advil, etc) Three 200mg  tabs four times a day (every meal & bedtime) AND Acetaminophen (Tylenol, etc) 500-650mg  four times a day (every meal & bedtime) A  prescription for pain medication (such as oxycodone, hydrocodone, tramadol, gabapentin, methocarbamol, etc) should be given to you upon discharge.  Take your pain medication as prescribed, IF NEEDED.  If you are having problems/concerns with the prescription medicine (does not control pain, nausea, vomiting, rash, itching, etc), please call us [phone] to see if we need to switch you to a different pain medicine that will work better for you and/or control your side effect better. If you need a refill on your pain medication, please give [place] 48 hour notice.  contact your pharmacy.  They will contact our office to request authorization. Prescriptions will not be filled after 5 pm or on week-ends  Avoid getting constipated.   Between the surgery and the pain medications, it is common to experience some constipation.   Increasing fluid intake and taking a fiber supplement (such as Metamucil, Citrucel, FiberCon, MiraLax, etc) 1-2 times a day regularly will usually help prevent this problem from occurring.   A mild laxative (prune juice, Milk of Magnesia, MiraLax, etc) should be taken according to package directions if there are no bowel movements after 48 hours.   Watch out for diarrhea.   If you have many loose  bowel movements, simplify your diet to bland foods & liquids for a few days.   Stop any stool softeners and decrease your fiber supplement.   Switching to mild anti-diarrheal medications (Kayopectate, Pepto Bismol) can help.   If this worsens or does not improve, please call us.  Wash / shower every day.  You may shower over the skin glue which is waterproof  Glue will flake off after about 2 weeks.  You may leave the incision  open to air.  You may replace a dressing/Band-Aid to cover the incision for comfort if you wish.   ACTIVITIES as tolerated:   You may resume regular (light) daily activities beginning the next day--such as daily self-care, walking, climbing stairs--gradually increasing activities as tolerated.  If you can walk 30 minutes without difficulty, it is safe to try more intense activity such as jogging, treadmill, bicycling, low-impact aerobics, swimming, etc. Save the most intensive and strenuous activity for last such as sit-ups, heavy lifting, contact sports, etc  Refrain from any heavy lifting or straining until you are off narcotics for pain control.   DO NOT PUSH THROUGH PAIN.  Let pain be your guide: If it hurts to do something, don't do it.  Pain is your body warning you to avoid that activity for another week until the pain goes down. You may drive when you are no longer taking prescription pain medication, you can comfortably wear a seatbelt, and you can safely maneuver your car and apply brakes. You may have sexual intercourse when it is comfortable.  FOLLOW UP in our office Please call [facility] at [phone] to set up an appointment to see your surgeon in the office for a follow-up appointment approximately 2-3 weeks after your surgery. Make sure that you call for this appointment the day you arrive home to insure a convenient appointment time.  10. IF YOU HAVE DISABILITY OR FAMILY LEAVE FORMS, BRING THEM TO THE OFFICE FOR PROCESSING.  DO NOT GIVE THEM TO YOUR DOCTOR.   WHEN TO CALL us [phone]: Poor pain control Reactions / problems with new medications (rash/itching, nausea, etc)  Fever over 101.5 F (38.5 C) Inability to urinate Nausea and/or vomiting Worsening swelling or bruising Continued bleeding from incision. Increased pain, redness, or drainage from the incision   The clinic staff is available to answer your questions during regular business hours (8:30am-5pm).  Please  don't hesitate to call and ask to speak to one of our nurses for clinical concerns.   If you have a medical emergency, go to the nearest emergency room or call 911.  A surgeon from [facility] is always on call at the [facility], , [place], [place], [place]  [id number] ? MAIN: [phone] ? TOLL FREE: [phone] ?  FAX [phone] [email or web address]

## 2022-10-01 NOTE — Interval H&P Note (Signed)
History and Physical Interval Note:  10/01/2022 12:36 PM  [name]  has presented today for surgery, with the diagnosis of BILIARY COLIC.  The various methods of treatment have been discussed with the patient and family. After consideration of risks, benefits and other options for treatment, the patient has consented to  Procedure(s): LAPAROSCOPIC CHOLECYSTECTOMY (N/A) as a surgical intervention.  The patient's history has been reviewed, patient examined, no change in status, stable for surgery.  I have reviewed the patient's chart and labs.  Questions were answered to the patient's satisfaction.     Ever Gustafson Rich Brave

## 2022-10-01 NOTE — Anesthesia Procedure Notes (Signed)
Procedure Name: Intubation Date/Time: 10/01/2022 1:23 PM  Performed by: Claudia Desanctis, CRNAPre-anesthesia Checklist: Patient identified, Emergency Drugs available, Suction available and Patient being monitored Patient Re-evaluated:Patient Re-evaluated prior to induction Oxygen Delivery Method: Circle system utilized Preoxygenation: Pre-oxygenation with 100% oxygen Induction Type: IV induction, Rapid sequence and Cricoid Pressure applied Laryngoscope Size: 2 and Miller Grade View: Grade I Tube type: Oral Tube size: 7.0 mm Number of attempts: 1 Airway Equipment and Method: Stylet Placement Confirmation: ETT inserted through vocal cords under direct vision, positive ETCO2 and breath sounds checked- equal and bilateral Tube secured with: Tape Dental Injury: Teeth and Oropharynx as per pre-operative assessment

## 2022-10-02 ENCOUNTER — Encounter (HOSPITAL_COMMUNITY): Payer: Self-pay | Admitting: Surgery

## 2022-10-03 LAB — SURGICAL PATHOLOGY

## 2022-10-08 DIAGNOSIS — H15002 Unspecified scleritis, left eye: Secondary | ICD-10-CM | POA: Diagnosis not present

## 2022-10-08 DIAGNOSIS — Z79899 Other long term (current) drug therapy: Secondary | ICD-10-CM | POA: Diagnosis not present

## 2022-10-08 DIAGNOSIS — E113293 Type 2 diabetes mellitus with mild nonproliferative diabetic retinopathy without macular edema, bilateral: Secondary | ICD-10-CM | POA: Diagnosis not present

## 2022-10-08 DIAGNOSIS — E1136 Type 2 diabetes mellitus with diabetic cataract: Secondary | ICD-10-CM | POA: Diagnosis not present

## 2022-10-08 DIAGNOSIS — H15102 Unspecified episcleritis, left eye: Secondary | ICD-10-CM | POA: Diagnosis not present

## 2022-10-08 DIAGNOSIS — H2513 Age-related nuclear cataract, bilateral: Secondary | ICD-10-CM | POA: Diagnosis not present

## 2022-10-08 DIAGNOSIS — H35033 Hypertensive retinopathy, bilateral: Secondary | ICD-10-CM | POA: Diagnosis not present

## 2022-10-08 DIAGNOSIS — Z7985 Long-term (current) use of injectable non-insulin antidiabetic drugs: Secondary | ICD-10-CM | POA: Diagnosis not present

## 2022-10-08 DIAGNOSIS — Z794 Long term (current) use of insulin: Secondary | ICD-10-CM | POA: Diagnosis not present

## 2022-11-07 ENCOUNTER — Telehealth: Payer: Self-pay | Admitting: Interventional Cardiology

## 2022-11-07 NOTE — Telephone Encounter (Signed)
   Pre-operative Risk Assessment    Patient Name: [name]  DOB: Aug 16, 1956 MRN: [id number]      Request for Surgical Clearance    Procedure:   Left total knee arthoplasty   Date of Surgery:  Clearance TBD                                 Surgeon:  Dr. Luiz Blare  Surgeon's Group or Practice Name:  [facility]  Phone number:  [phone] Fax number:  [phone]    Type of Clearance Requested:   - Medical  - Pharmacy:  Hold "unclear"      Type of Anesthesia:  Spinal   Additional requests/questions:    SignedForest Gleason   11/07/2022, 8:54 AM

## 2022-11-08 NOTE — Telephone Encounter (Signed)
Spoke with patient who states that she is not sure when she will have her procedure and will call our office back closer to the time it will be done.

## 2022-11-08 NOTE — Telephone Encounter (Signed)
   Name: [name]  DOB: 1956/07/29  MRN: [id number]  Primary Cardiologist: Lesleigh Noe, MD   Preoperative team, please contact this patient and set up a phone call appointment for further preoperative risk assessment. Please obtain consent and complete medication review. Thank you for your help.  I confirm that guidance regarding antiplatelet and oral anticoagulation therapy has been completed and, if necessary, noted below.  Pharmacy has addressed anticoagulation hold request.   Ronney Asters, NP 11/08/2022, 12:25 PM [facility]

## 2022-11-08 NOTE — Telephone Encounter (Signed)
Patient with diagnosis of afib on Eliquis for anticoagulation.    Procedure: Left total knee arthoplasty   Date of procedure: TBD   CHA2DS2-VASc Score = 5   This indicates a 7.2% annual risk of stroke. The patient's score is based upon: CHF History: 1 HTN History: 1 Diabetes History: 1 Stroke History: 0 Vascular Disease History: 1 Age Score: 0 Gender Score: 1      CrCl 76 ml/min Platelet count 260  Per office protocol, patient can hold Eliquis for 3 days prior to procedure.    **This guidance is not considered finalized until pre-operative APP has relayed final recommendations.**

## 2022-11-19 ENCOUNTER — Ambulatory Visit (HOSPITAL_COMMUNITY)
Admission: RE | Admit: 2022-11-19 | Discharge: 2022-11-19 | Disposition: A | Discharge status: Home / Self Care | Payer: BC Managed Care – PPO | Source: Ambulatory Visit | Attending: Family Medicine | Admitting: Family Medicine

## 2022-11-19 ENCOUNTER — Ambulatory Visit (INDEPENDENT_AMBULATORY_CARE_PROVIDER_SITE_OTHER): Payer: BC Managed Care – PPO

## 2022-11-19 ENCOUNTER — Other Ambulatory Visit: Payer: Self-pay

## 2022-11-19 ENCOUNTER — Other Ambulatory Visit: Payer: Self-pay | Admitting: Student

## 2022-11-19 ENCOUNTER — Encounter (HOSPITAL_COMMUNITY): Payer: Self-pay

## 2022-11-19 DIAGNOSIS — M7732 Calcaneal spur, left foot: Secondary | ICD-10-CM | POA: Diagnosis not present

## 2022-11-19 DIAGNOSIS — M722 Plantar fascial fibromatosis: Secondary | ICD-10-CM

## 2022-11-19 NOTE — ED Triage Notes (Signed)
Patient presents for 2 weeks of left heel pain.  Patient is trying to get approved for knee replacement on that same side.  Pain has been going on for 2 weeks, worse with certain movements or bearing weight.  Denies known injury.  Was using crutched for a week, and now using a cane.

## 2022-11-19 NOTE — ED Provider Notes (Signed)
[facility]    CSN: [id number] Arrival date & time: 11/19/22  1018      History   Chief Complaint No chief complaint on file.   HPI [name] is a 67 y.o. female.   Patient is here for left heel pain x several weeks.  No known injury.  It is very painful in the morning especially.  Has to use crutches/cane to use the bathroom.  Using biofreeze without much help.  She takes tramadol already (for knee pain) but not helpful.       Past Medical History:  Diagnosis Date   Allergy    Anemia    Anemia    Aortic insufficiency    a. Mod by echo 05/2015.   Arthritis    "knees" (07/11/2015)   Atrial fibrillation (HCC)    On Eliquis   Carcinoma of hilus of lung ([place]) 01/20/2012   IIIB NSCL  Right hilar mass compressing esophagus/presenting with dysphagia Rx Surgery/RT/chemo dx January 2001   Coronary artery disease    a. Abnormal stress test -> LHc 06/2015 s/p DES to mCX and mLAD, residual D2 disease treated medially.   Diverticulitis    Elevated blood pressure    Erythema nodosum    Heart murmur    History of kidney stones    Insomnia    lung ca dx'd 10/1999   chemo/xrt comp 05/29/2000   Myalgia    Obesity    Pericardial effusion    a. 05/2015 Echo: EF 55-60%, no rwma, Gr 1 DD, no effusion - but an effusion was seen on CT which was felt to be increased in size compared to 12/2014 - pericardium also thickened.    Pericarditis    a. Dx 05/2015.   Pneumothorax, spontaneous, tension 01/20/2012   October, 2010   PONV (postoperative nausea and vomiting)    Pulmonary fibrosis ([place]) 01/20/2012   Due to previous surgery and chest radiation for lung cancer   Restrictive lung disease    Type II diabetes mellitus ([place])     Patient Active Problem List   Diagnosis Date Noted   Statin myopathy 05/28/2022   NSTEMI (non-ST elevated myocardial infarction) ([place]) 11/26/2021   OSA (obstructive sleep apnea) 07/25/2021   Hemoptysis 06/22/2021   Atherosclerosis of aorta  ([place]) 03/13/2021   Palpitations 03/13/2021   Atypical angina - presumed 02/01/2021   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   Type 2 diabetes mellitus with stage 3 chronic kidney disease, with long-term current use of insulin ([place]) 07/17/2020   PVD (peripheral vascular disease) ([place]) 07/06/2020   Daytime sleepiness 02/14/2020   Paroxysmal atrial fibrillation ([place]) 01/25/2020   Secondary hypercoagulable state ([place]) 01/25/2020   Hypothyroidism 02/17/2019   Cellulitis of fourth toe, left 01/01/2019   Diverticulosis of colon without hemorrhage    Chronic diastolic (congestive) heart failure ([place]) 08/04/2017   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   Chronic pericarditis 06/13/2017   Unstable angina ([place]) 04/12/2017   Atrial fibrillation with RVR ([place])    Hyperlipidemia 08/07/2015   CAD S/P percutaneous coronary angioplasty 07/25/2015   Hypertension, essential    Aortic insufficiency    Stented coronary artery    Abnormal nuclear stress test 07/11/2015   Abnormal stress test 07/04/2015   Pericarditis    Controlled type 2 diabetes mellitus without complication, with long-term current use of insulin (HCC)    Pericardial effusion    Chest pain 05/30/2015   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   ATRIAL FIBRILLATION ABLATION N/A 02/27/2022   Procedure: ATRIAL FIBRILLATION ABLATION;  Surgeon: Constance Haw, MD;  Location: [facility];  Service: Cardiovascular;  Laterality: N/A;   BRONCHIAL WASHINGS  08/03/2021   Procedure: BRONCHIAL WASHINGS;  Surgeon: Rigoberto Noel, MD;  Location: [facility];  Service: Cardiopulmonary;;   CARDIAC CATHETERIZATION N/A 07/11/2015   Procedure: Left Heart Cath and Coronary Angiography;  Surgeon: Jettie Booze, MD;  Location: [facility];  Service: Cardiovascular;  Laterality: N/A;   CARDIAC CATHETERIZATION  07/11/2015   Procedure: Coronary Stent Intervention;  Surgeon: Jettie Booze, MD;  Location: [facility];  Service: Cardiovascular;;   CARPAL TUNNEL RELEASE Bilateral 1998-2005?   right-left   CARPAL TUNNEL RELEASE Left 2009?   "for the 2nd time"   CHOLECYSTECTOMY N/A 10/01/2022   Procedure: LAPAROSCOPIC CHOLECYSTECTOMY;  Surgeon: Clovis Riley, MD;  Location: [facility];  Service: General;  Laterality: N/A;   COLONOSCOPY     COLONOSCOPY WITH PROPOFOL N/A 05/28/2018   Procedure: COLONOSCOPY WITH PROPOFOL;  Surgeon: Milus Banister, MD;  Location: [facility];  Service: Endoscopy;  Laterality: N/A;   CORONARY ANGIOPLASTY     CYSTOSCOPY W/ STONE MANIPULATION  10/22/2011   ESOPHAGOGASTRODUODENOSCOPY (EGD) WITH ESOPHAGEAL DILATION  10/21/2010   FOOT SURGERY Bilateral 06/22/1979   Callous removed    INNER EAR SURGERY Right ~ 2009   KNEE ARTHROPLASTY Right 10/21/1989   KNEE ARTHROSCOPY  ~ 2000   LATERAL RELEASE   LEFT HEART CATH AND CORONARY ANGIOGRAPHY N/A 12/18/2016   Procedure: Left Heart Cath and Coronary Angiography;  Surgeon: Burnell Blanks, MD;  Location: [facility];  Service: Cardiovascular;  Laterality: N/A;   LEFT HEART CATH AND CORONARY ANGIOGRAPHY N/A 02/01/2021   Procedure: LEFT HEART CATH AND CORONARY ANGIOGRAPHY;  Surgeon: Leonie Man, MD;  Location: [facility];  Service: Cardiovascular;  Laterality: N/A;   LUNG CANCER SURGERY Left 10/22/1999   "small cell"   LUNG  SURGERY Right 10/21/2005   "reinflated it"   PULMONARY EMBOLISM SURGERY  10/21/2005   LUNG COLLAPSE   RIGHT AND LEFT HEART CATH N/A 06/25/2017   Procedure: RIGHT AND LEFT HEART CATH;  Surgeon: Sherren Mocha, MD;  Location: [facility];  Service: Cardiovascular;  Laterality: N/A;   SHOULDER ADHESION RELEASE Right ~ 2004   SHOULDER ARTHROSCOPY W/ ROTATOR CUFF REPAIR Right ~ 2003   UPPER GASTROINTESTINAL ENDOSCOPY  06/05/2021   VIDEO BRONCHOSCOPY N/A 08/03/2021   Procedure: VIDEO BRONCHOSCOPY WITHOUT FLUORO;  Surgeon: Rigoberto Noel, MD;  Location: [facility];  Service: Cardiopulmonary;  Laterality: N/A;    OB History   No obstetric history on file.      Home Medications    Prior to Admission medications   Medication Sig Start Date End Date Taking? Authorizing Provider  ACCU-CHEK GUIDE test strip  05/23/22   [provider]  Accu-Chek Softclix Lancets lancets USE LANCETS 3 TIMES A DAY AS DIRECTED 30 DAYS 11/12/19   [provider]  acetaminophen (TYLENOL) 650 MG CR tablet Take 1,300 mg by mouth every 8 (eight) hours as needed for pain (headache).    [provider]  apixaban (ELIQUIS) 5 MG TABS tablet TAKE 1 TABLET BY MOUTH TWICE A DAY 01/30/22   Camnitz, Ocie Doyne, MD  B-D UF III MINI PEN NEEDLES 31G X 5 MM MISC 2 (two) times daily. as directed 10/31/18   [provider]  Blood Glucose Monitoring Suppl (ACCU-CHEK GUIDE) w/Device KIT See admin instructions. 11/12/19   [provider]  co-enzyme Q-10 50 MG capsule TAKE 1 CAPSULE BY MOUTH EVERY DAY 11/30/19   Dorothy Spark, MD  diltiazem (CARDIZEM CD) 360 MG 24 hr capsule TAKE 1 CAPSULE BY MOUTH EVERY DAY 07/19/22   Camnitz, Ocie Doyne, MD  dofetilide (TIKOSYN) 500 MCG capsule Take 500 mcg by mouth 2 (two) times daily. 07/20/22   [provider]  furosemide (LASIX) 40 MG tablet Take 1 tablet (40 mg total) by mouth daily as needed for fluid or edema. 03/15/21   Belva Crome, MD   levothyroxine (SYNTHROID, LEVOTHROID) 50 MCG tablet Take 50 mcg by mouth daily before breakfast.  05/14/17   [provider]  losartan (COZAAR) 25 MG tablet TAKE 1 TABLET BY MOUTH EVERY DAY 11/20/21   Belva Crome, MD  Menthol, Topical Analgesic, (BIOFREEZE EX) Apply 1 Application topically daily as needed (pain).    [provider]  metoprolol succinate (TOPROL-XL) 50 MG 24 hr tablet Take 1.5 tablets (75 mg total) by mouth at bedtime. Take with or immediately following a meal. Patient taking differently: Take 75 mg by mouth at bedtime. Take with or immediately following a meal. At preop on 12/8 pt states she takes in am 11/20/21   Belva Crome, MD  nitroGLYCERIN (NITROSTAT) 0.4 MG SL tablet Place 0.4 mg under the tongue every 5 (five) minutes as needed for chest pain.    [provider]  NOVOLOG MIX 70/30 FLEXPEN (70-30) 100 UNIT/ML FlexPen Inject 35-40 Units into the skin See admin instructions. Inject 40 units in the morning and 35 units at night 04/22/22   [provider]  OZEMPIC, 0.25 OR 0.5 MG/DOSE, 2 MG/1.5ML SOPN INJECT 0.5 MG INTO THE SKIN ONCE A WEEK. 03/30/19   Glendale Chard, MD  pantoprazole (PROTONIX) 40 MG tablet Take 1 tablet (40 mg total) by mouth daily before breakfast. Patient not taking: Reported on 09/26/2022 08/06/22   Esterwood, Amy S, PA-C  pravastatin (PRAVACHOL) 20 MG tablet TAKE 1 TABLET BY MOUTH EVERY DAY 11/20/21   Belva Crome, MD  spironolactone (ALDACTONE) 25 MG tablet TAKE 1 TABLET (25 MG TOTAL) BY MOUTH DAILY. 07/23/22   Shirley Friar, PA-C  traMADol (ULTRAM) 50 MG tablet Take 50 mg by mouth every 6 (six) hours as needed for moderate pain. 09/12/22   [provider]    Family History Family History  Problem Relation Age of Onset   Heart disease Father    Diabetes Father    Pancreatic cancer Mother    Diabetes Mother    Hypertension Sister        x 3   Diabetes Sister        x 4   Diabetes Maternal  Grandmother    Diabetes Paternal Grandmother    Diabetes Sister    Diabetes Sister    Diabetes  Sister    Kidney disease Sister    Colon cancer Neg Hx    Colon polyps Neg Hx    Rectal cancer Neg Hx    Stomach cancer Neg Hx    Esophageal cancer Neg Hx    Breast cancer Neg Hx     Social History Social History   Tobacco Use   Smoking status: Former    Packs/day: 0.25    Years: 5.00    Total pack years: 1.25    Types: Cigarettes    Quit date: 07/22/1999    Years since quitting: 23.3   Smokeless tobacco: Never  Vaping Use   Vaping Use: Never used  Substance Use Topics   Alcohol use: No    Alcohol/week: 0.0 standard drinks of alcohol   Drug use: No     Allergies   Hydrocodone, Morphine and related, Crestor [rosuvastatin], Invokana [canagliflozin], Metformin hcl, Lisinopril, and Nsaids   Review of Systems Review of Systems  Constitutional: Negative.   HENT: Negative.    Respiratory: Negative.    Cardiovascular: Negative.   Gastrointestinal: Negative.   Genitourinary: Negative.   Musculoskeletal:  Positive for gait problem.     Physical Exam Triage Vital Signs ED Triage Vitals [11/19/22 1049]  Enc Vitals Group     BP      Pulse      Resp      Temp      Temp src      SpO2      Weight      Height      Head Circumference      Peak Flow      Pain Score 8     Pain Loc      Pain Edu?      Excl. in [place]?    No data found.  Updated Vital Signs There were no vitals taken for this visit.  Visual Acuity Right Eye Distance:   Left Eye Distance:   Bilateral Distance:    Right Eye Near:   Left Eye Near:    Bilateral Near:     Physical Exam Constitutional:      Appearance: Normal appearance.  Cardiovascular:     Rate and Rhythm: Normal rate.  Pulmonary:     Effort: Pulmonary effort is normal.  Musculoskeletal:     Comments: TTP to the left heel, and bottom of left heel;  pain with dorsi and plantar flexion;  no swelling or other deformity noted.    Skin:    General: Skin is warm.  Neurological:     General: No focal deficit present.     Mental Status: She is alert.  Psychiatric:        Mood and Affect: Mood normal.      UC Treatments / Results  Labs (all labs ordered are listed, but only abnormal results are displayed) Labs Reviewed - No data to display  EKG   Radiology DG Os Calcis Left  Result Date: 11/19/2022 CLINICAL DATA:  Left calcaneal pain for 2 weeks EXAM: LEFT OS CALCIS - 2+ VIEW COMPARISON:  01/19/2019 FINDINGS: Lateral and Harris views of the left calcaneus are obtained. There are no acute displaced fractures. Alignment is anatomic. Prominent inferior calcaneal spur. Moderate osteoarthritis throughout the hindfoot, greatest at the articulation of the calcaneus and cuboid. Soft tissues are unremarkable. IMPRESSION: 1. Hindfoot osteoarthritis. 2. No acute bony abnormality. 3. Inferior calcaneal spur. Electronically Signed   By: Randa Ngo M.D.   On: 11/19/2022 11:24  Procedures Procedures (including critical care time)  Medications Ordered in UC Medications - No data to display  Initial Impression / Assessment and Plan / UC Course  I have reviewed the triage vital signs and the nursing notes.  Pertinent labs & imaging results that were available during my care of the patient were reviewed by me and considered in my medical decision making (see chart for details).    Final Clinical Impressions(s) / UC Diagnoses   Final diagnoses:  Bone spur of inferior portion of left calcaneus  Plantar fasciitis of left foot     Discharge Instructions      You were seen today for heel pain.  Your xray did show a heel spur and arthritis.  This may be contributing to some of your pain.  Please follow up with your orthopedist for this.  I also believe you have plantar fasciitis.  I have given you information on this.  I recommend you do home exercises to start for stretching, and continue tramadol for pain. Please  follow up with your primary care provider if you continue with pain.     ED Prescriptions   None    PDMP not reviewed this encounter.   Rondel Oh, MD 11/19/22 1134

## 2022-11-19 NOTE — Discharge Instructions (Addendum)
You were seen today for heel pain.  Your xray did show a heel spur and arthritis.  This may be contributing to some of your pain.  Please follow up with your orthopedist for this.  I also believe you have plantar fasciitis.  I have given you information on this.  I recommend you do home exercises to start for stretching, and continue tramadol for pain. Please follow up with your primary care provider if you continue with pain.

## 2022-11-28 ENCOUNTER — Encounter (HOSPITAL_COMMUNITY): Payer: Self-pay | Admitting: Physician Assistant

## 2022-11-28 ENCOUNTER — Ambulatory Visit (HOSPITAL_COMMUNITY)
Admission: RE | Admit: 2022-11-28 | Discharge: 2022-11-28 | Disposition: A | Discharge status: Home / Self Care | Payer: Medicare Other | Source: Ambulatory Visit | Attending: Physician Assistant | Admitting: Physician Assistant

## 2022-11-28 VITALS — BP 118/74 | HR 85 | Ht 67.5 in | Wt 224.0 lb

## 2022-11-28 DIAGNOSIS — I5032 Chronic diastolic (congestive) heart failure: Secondary | ICD-10-CM | POA: Diagnosis not present

## 2022-11-28 DIAGNOSIS — I480 Paroxysmal atrial fibrillation: Secondary | ICD-10-CM | POA: Diagnosis present

## 2022-11-28 DIAGNOSIS — D6869 Other thrombophilia: Secondary | ICD-10-CM | POA: Diagnosis not present

## 2022-11-28 DIAGNOSIS — Z79899 Other long term (current) drug therapy: Secondary | ICD-10-CM | POA: Diagnosis not present

## 2022-11-28 DIAGNOSIS — I2510 Atherosclerotic heart disease of native coronary artery without angina pectoris: Secondary | ICD-10-CM | POA: Insufficient documentation

## 2022-11-28 DIAGNOSIS — E669 Obesity, unspecified: Secondary | ICD-10-CM | POA: Diagnosis not present

## 2022-11-28 DIAGNOSIS — Z6834 Body mass index (BMI) 34.0-34.9, adult: Secondary | ICD-10-CM | POA: Insufficient documentation

## 2022-11-28 DIAGNOSIS — Z7901 Long term (current) use of anticoagulants: Secondary | ICD-10-CM | POA: Diagnosis not present

## 2022-11-28 DIAGNOSIS — I110 Hypertensive heart disease with heart failure: Secondary | ICD-10-CM | POA: Insufficient documentation

## 2022-11-28 LAB — BASIC METABOLIC PANEL
Anion gap: 9 (ref 5–15)
BUN: 12 mg/dL (ref 8–23)
CO2: 27 mmol/L (ref 22–32)
Calcium: 8.8 mg/dL — ABNORMAL LOW (ref 8.9–10.3)
Chloride: 104 mmol/L (ref 98–111)
Creatinine, Ser: 0.9 mg/dL (ref 0.44–1.00)
GFR, Estimated: >60 mL/min (ref >60)
Glucose, Bld: 130 mg/dL — ABNORMAL HIGH (ref 70–99)
Potassium: 4.1 mmol/L (ref 3.5–5.1)
Sodium: 140 mmol/L (ref 135–145)

## 2022-11-28 LAB — MAGNESIUM: Magnesium: 1.9 mg/dL (ref 1.7–2.4)

## 2022-11-28 NOTE — Progress Notes (Signed)
Primary Care Physician: Mckinley Jewel, MD Primary Cardiologist: Dr Tamala Julian Primary Electrophysiologist: Dr Curt Bears Referring Physician: Dr Lona Kettle [name] is a 67 y.o. female with a history of paroxysmal atrial fibrillation, CAD s/p DES to [facility] and mLAD 07/11/15 (patent stents on repeat cath 11/2016), chronic pericardial effusion, h/o chronic pericarditis with chronic pericardial rub treated intermittently with steroids and colchicine, chronic diastolic CHF, DM, HTN, remote h/o treated non-small cell lung cancer, pulmonary fibrosis, aortic insufficiency, COPD and chronic anemia who presents for follow up in the [facility]. Patient was admitted for symptomatic atrial fibrillation with RVR 01/2019. She was started on Tikosyn during her admission. She denies snoring or significant alcohol use. Patient was seen in the ER on 01/23/20 with symptoms of heart racing and SOB. She was found to be in afib with RVR. She converted with IV metoprolol. She did get her second COVID vaccine on 01/19/20. There were no other specific triggers that the patient could identify.   Patient is s/p afib ablation with Dr Curt Bears on 02/27/22.   On follow up today, patient reports that she has done well since her last visit. She has actually continued dofetilide until she runs out of medication. She has two months left. She has very rare, brief palpitations lasting < 3 minutes associated with stress. No bleeding issues on anticoagulation.   Today, she denies symptoms of chest pain, shortness of breath, orthopnea, PND, lower extremity edema, dizziness, presyncope, syncope, snoring, daytime somnolence, bleeding, or neurologic sequela. The patient is tolerating medications without difficulties and is otherwise without complaint today.    Atrial Fibrillation Risk Factors:  she does not have symptoms or diagnosis of sleep apnea. she does not have a history of rheumatic fever. she does not have a  history of alcohol use. The patient does not have a history of early familial atrial fibrillation or other arrhythmias.  she has a BMI of Body mass index is 34.57 kg/m.[name] Filed Weights   11/28/22 1128  Weight: 101.6 kg    Family History  Problem Relation Age of Onset   Heart disease Father    Diabetes Father    Pancreatic cancer Mother    Diabetes Mother    Hypertension Sister        x 3   Diabetes Sister        x 4   Diabetes Maternal Grandmother    Diabetes Paternal Grandmother    Diabetes Sister    Diabetes Sister    Diabetes Sister    Kidney disease Sister    Colon cancer Neg Hx    Colon polyps Neg Hx    Rectal cancer Neg Hx    Stomach cancer Neg Hx    Esophageal cancer Neg Hx    Breast cancer Neg Hx      Atrial Fibrillation Management history:  Previous antiarrhythmic drugs: dofetilide Previous cardioversions: none Previous ablations: 02/27/22 CHADS2VASC score: 4 (female, CAD, HTN, DM) Anticoagulation history: Eliquis   Past Medical History:  Diagnosis Date   Allergy    Anemia    Anemia    Aortic insufficiency    a. Mod by echo 05/2015.   Arthritis    "knees" (07/11/2015)   Atrial fibrillation (HCC)    On Eliquis   Carcinoma of hilus of lung ([place]) 01/20/2012   IIIB NSCL  Right hilar mass compressing esophagus/presenting with dysphagia Rx Surgery/RT/chemo dx January 2001   Coronary artery disease    a. Abnormal  stress test -> LHc 06/2015 s/p DES to mCX and mLAD, residual D2 disease treated medially.   Diverticulitis    Elevated blood pressure    Erythema nodosum    Heart murmur    History of kidney stones    Insomnia    lung ca dx'd 10/1999   chemo/xrt comp 05/29/2000   Myalgia    Obesity    Pericardial effusion    a. 05/2015 Echo: EF 55-60%, no rwma, Gr 1 DD, no effusion - but an effusion was seen on CT which was felt to be increased in size compared to 12/2014 - pericardium also thickened.    Pericarditis    a. Dx 05/2015.   Pneumothorax,  spontaneous, tension 01/20/2012   October, 2010   PONV (postoperative nausea and vomiting)    Pulmonary fibrosis ([place]) 01/20/2012   Due to previous surgery and chest radiation for lung cancer   Restrictive lung disease    Type II diabetes mellitus [facility])    Past Surgical History:  Procedure Laterality Date   ABDOMINAL HYSTERECTOMY  10/21/2006   APPENDECTOMY  1969?   ATRIAL FIBRILLATION ABLATION N/A 02/27/2022   Procedure: ATRIAL FIBRILLATION ABLATION;  Surgeon: Constance Haw, MD;  Location: [place] [facility];  Service: Cardiovascular;  Laterality: N/A;   BRONCHIAL WASHINGS  08/03/2021   Procedure: BRONCHIAL WASHINGS;  Surgeon: Rigoberto Noel, MD;  Location: [place];  Service: Cardiopulmonary;;   CARDIAC CATHETERIZATION N/A 07/11/2015   Procedure: Left Heart Cath and Coronary Angiography;  Surgeon: Jettie Booze, MD;  Location: [place] [facility];  Service: Cardiovascular;  Laterality: N/A;   CARDIAC CATHETERIZATION  07/11/2015   Procedure: Coronary Stent Intervention;  Surgeon: Jettie Booze, MD;  Location: [place] [facility];  Service: Cardiovascular;;   CARPAL TUNNEL RELEASE Bilateral 1998-2005?   right-left   CARPAL TUNNEL RELEASE Left 2009?   "for the 2nd time"   CHOLECYSTECTOMY N/A 10/01/2022   Procedure: LAPAROSCOPIC CHOLECYSTECTOMY;  Surgeon: Clovis Riley, MD;  Location: [facility];  Service: General;  Laterality: N/A;   COLONOSCOPY     COLONOSCOPY WITH PROPOFOL N/A 05/28/2018   Procedure: COLONOSCOPY WITH PROPOFOL;  Surgeon: Milus Banister, MD;  Location: [facility];  Service: Endoscopy;  Laterality: N/A;   CORONARY ANGIOPLASTY     CYSTOSCOPY W/ STONE MANIPULATION  10/22/2011   ESOPHAGOGASTRODUODENOSCOPY (EGD) WITH ESOPHAGEAL DILATION  10/21/2010   FOOT SURGERY Bilateral 06/22/1979   Callous removed    INNER EAR SURGERY Right ~ 2009   KNEE ARTHROPLASTY Right 10/21/1989   KNEE ARTHROSCOPY  ~ 2000   LATERAL RELEASE   LEFT HEART CATH AND CORONARY  ANGIOGRAPHY N/A 12/18/2016   Procedure: Left Heart Cath and Coronary Angiography;  Surgeon: Burnell Blanks, MD;  Location: [place] [facility];  Service: Cardiovascular;  Laterality: N/A;   LEFT HEART CATH AND CORONARY ANGIOGRAPHY N/A 02/01/2021   Procedure: LEFT HEART CATH AND CORONARY ANGIOGRAPHY;  Surgeon: Leonie Man, MD;  Location: [place] [facility];  Service: Cardiovascular;  Laterality: N/A;   LUNG CANCER SURGERY Left 10/22/1999   "small cell"   LUNG SURGERY Right 10/21/2005   "reinflated it"   PULMONARY EMBOLISM SURGERY  10/21/2005   LUNG COLLAPSE   RIGHT AND LEFT HEART CATH N/A 06/25/2017   Procedure: RIGHT AND LEFT HEART CATH;  Surgeon: Sherren Mocha, MD;  Location: [place] [facility];  Service: Cardiovascular;  Laterality: N/A;   SHOULDER ADHESION RELEASE Right ~ 2004   SHOULDER ARTHROSCOPY W/  ROTATOR CUFF REPAIR Right ~ 2003   UPPER GASTROINTESTINAL ENDOSCOPY  06/05/2021   VIDEO BRONCHOSCOPY N/A 08/03/2021   Procedure: VIDEO BRONCHOSCOPY WITHOUT FLUORO;  Surgeon: Rigoberto Noel, MD;  Location: [place];  Service: Cardiopulmonary;  Laterality: N/A;    Current Outpatient Medications  Medication Sig Dispense Refill   ACCU-CHEK GUIDE test strip      Accu-Chek Softclix Lancets lancets USE LANCETS 3 TIMES A DAY AS DIRECTED 30 DAYS     acetaminophen (TYLENOL) 650 MG CR tablet Take 1,300 mg by mouth every 8 (eight) hours as needed for pain (headache).     apixaban (ELIQUIS) 5 MG TABS tablet TAKE 1 TABLET BY MOUTH TWICE A DAY 180 tablet 2   B-D UF III MINI PEN NEEDLES 31G X 5 MM MISC 2 (two) times daily. as directed     Blood Glucose Monitoring Suppl (ACCU-CHEK GUIDE) w/Device KIT See admin instructions.     co-enzyme Q-10 50 MG capsule TAKE 1 CAPSULE BY MOUTH EVERY DAY 30 capsule 7   diltiazem (CARDIZEM CD) 360 MG 24 hr capsule TAKE 1 CAPSULE BY MOUTH EVERY DAY 90 capsule 3   dofetilide (TIKOSYN) 500 MCG capsule Take 500 mcg by mouth 2 (two) times daily.      furosemide (LASIX) 40 MG tablet Take 1 tablet (40 mg total) by mouth daily as needed for fluid or edema. 30 tablet 6   levothyroxine (SYNTHROID, LEVOTHROID) 50 MCG tablet Take 50 mcg by mouth daily before breakfast.   6   losartan (COZAAR) 25 MG tablet TAKE 1 TABLET BY MOUTH EVERY DAY 90 tablet 3   Menthol, Topical Analgesic, (BIOFREEZE EX) Apply 1 Application topically daily as needed (pain).     metoprolol succinate (TOPROL-XL) 50 MG 24 hr tablet TAKE 1.5 TABLETS (75 MG TOTAL) BY MOUTH AT BEDTIME. TAKE WITH OR IMMEDIATELY FOLLOWING A MEAL. 135 tablet 2   nitroGLYCERIN (NITROSTAT) 0.4 MG SL tablet Place 0.4 mg under the tongue every 5 (five) minutes as needed for chest pain.     NOVOLOG MIX 70/30 FLEXPEN (70-30) 100 UNIT/ML FlexPen Inject 35-40 Units into the skin See admin instructions. Inject 40 units in the morning and 35 units at night     OZEMPIC, 0.25 OR 0.5 MG/DOSE, 2 MG/1.5ML SOPN INJECT 0.5 MG INTO THE SKIN ONCE A WEEK. 3 pen 3   pantoprazole (PROTONIX) 40 MG tablet Take 1 tablet (40 mg total) by mouth daily before breakfast. 30 tablet 4   pravastatin (PRAVACHOL) 20 MG tablet TAKE 1 TABLET BY MOUTH EVERY DAY 90 tablet 3   spironolactone (ALDACTONE) 25 MG tablet TAKE 1 TABLET (25 MG TOTAL) BY MOUTH DAILY. 90 tablet 3   traMADol (ULTRAM) 50 MG tablet Take 50 mg by mouth every 6 (six) hours as needed for moderate pain.     No current facility-administered medications for this encounter.    Allergies  Allergen Reactions   Hydrocodone Hives and Itching   Morphine And Related Other (See Comments)    GI PROBLEMS   Crestor [Rosuvastatin] Other (See Comments)    Pt reports causes lower extremity muscle aches/cramping   Invokana [Canagliflozin] Diarrhea   Metformin Hcl Diarrhea   Lisinopril Cough   Nsaids Other (See Comments)    GI PROBLEMS    Social History   Socioeconomic History   Marital status: Single    Spouse name: Not on file   Number of children: 0   Years of education:  Not on file   Highest education  level: Not on file  Occupational History   Occupation: RETIRED  Tobacco Use   Smoking status: Former    Packs/day: 0.25    Years: 5.00    Total pack years: 1.25    Types: Cigarettes    Quit date: 07/22/1999    Years since quitting: 23.3   Smokeless tobacco: Never   Tobacco comments:    Former smoker 11/28/22  Vaping Use   Vaping Use: Never used  Substance and Sexual Activity   Alcohol use: No    Alcohol/week: 0.0 standard drinks of alcohol   Drug use: No   Sexual activity: Yes    Birth control/protection: Other-see comments    Comment: hysterectomy  Other Topics Concern   Not on file  Social History Narrative   Not on file   Social Determinants of Health   Financial Resource Strain: Low Risk  (07/12/2021)   Overall Financial Resource Strain (CARDIA)    Difficulty of Paying Living Expenses: Not hard at all  Food Insecurity: No Food Insecurity (07/12/2021)   Hunger Vital Sign    Worried About Running Out of Food in the Last Year: Never true    [place] in the Last Year: Never true  Transportation Needs: No Transportation Needs (07/12/2021)   PRAPARE - Hydrologist (Medical): No    Lack of Transportation (Non-Medical): No  Physical Activity: Inactive (07/12/2021)   Exercise Vital Sign    Days of Exercise per Week: 0 days    Minutes of Exercise per Session: 0 min  Stress: No Stress Concern Present (07/12/2021)   [place]    Feeling of Stress : Not at all  Social Connections: Not on file  Intimate Partner Violence: Not At Risk (08/26/2018)   Humiliation, Afraid, Rape, and Kick questionnaire    Fear of Current or Ex-Partner: No    Emotionally Abused: No    Physically Abused: No    Sexually Abused: No     ROS- All systems are reviewed and negative except as per the HPI above.  Physical Exam: Vitals:   11/28/22 1128  BP: 118/74   Pulse: 85  Weight: 101.6 kg  Height: 5' 7.5" (1.715 m)    GEN- The patient is a well appearing obese female, alert and oriented x 3 today.   HEENT-head normocephalic, atraumatic, sclera clear, conjunctiva pink, hearing intact, trachea midline. Lungs- Clear to ausculation bilaterally, normal work of breathing Heart- Regular rate and rhythm, no rubs or gallops, 2/6 systolic murmur  GI- soft, NT, ND, + BS Extremities- no clubbing, cyanosis, or edema MS- no significant deformity or atrophy Skin- no rash or lesion Psych- euthymic mood, full affect Neuro- strength and sensation are intact   Wt Readings from Last 3 Encounters:  11/28/22 101.6 kg  10/01/22 99.8 kg  09/27/22 99.8 kg    EKG today demonstrates  SR Vent. rate 85 BPM PR interval 140 ms QRS duration 86 ms QT/QTcB 388/461 ms  Echo 07/18/22 demonstrated  1. Compared to 11/26/21 pericardial effusion is larger (previously appeared  trivial; now small).   2. Left ventricular ejection fraction, by estimation, is 60 to 65%. The  left ventricle has normal function. The left ventricle has no regional  wall motion abnormalities. Left ventricular diastolic parameters are  consistent with Grade II diastolic  dysfunction (pseudonormalization).   3. Right ventricular systolic function is normal. The right ventricular  size is normal.   4.  A small pericardial effusion is present.   5. The mitral valve is normal in structure. No evidence of mitral valve  regurgitation. No evidence of mitral stenosis.   6. The aortic valve is tricuspid. Aortic valve regurgitation is mild to  moderate. No aortic stenosis is present.   7. Aortic dilatation noted. There is borderline dilatation of the  ascending aorta, measuring 38 mm.   8. The inferior vena cava is normal in size with greater than 50%  respiratory variability, suggesting right atrial pressure of 3 mmHg.   Epic records are reviewed at length today   CHA2DS2-VASc Score = 5  The  patient's score is based upon: CHF History: 1 HTN History: 1 Diabetes History: 1 Stroke History: 0 Vascular Disease History: 1 Age Score: 0 Gender Score: 1       ASSESSMENT AND PLAN: 1. Paroxysmal Atrial Fibrillation (ICD10:  I48.0) The patient's CHA2DS2-VASc score is 5, indicating a 7.2% annual risk of stroke.   S/p afib ablation 02/27/22 Patient has continued to stay on dofetilide, plans to continue for two more months then discontinue. We discussed that she would need to be hospitalized to resume this medication in the future. She voiced understanding and would like to proceed with a trial off AAD. Check bmet/mag today. Continue diltiazem 360 mg daily  Continue Toprol 75 mg daily Continue Eliquis 5 mg BID   2. Secondary Hypercoagulable State (ICD10:  D68.69) The patient is at significant risk for stroke/thromboembolism based upon her CHA2DS2-VASc Score of 5.  Continue Apixaban (Eliquis).   3. Obesity Body mass index is 34.57 kg/m. Lifestyle modification was discussed and encouraged including regular physical activity and weight reduction.  4. CAD CAC score 2774 99th percentile.  Stress test 02/20/22 low risk. No anginal symptoms.  5. HTN Stable, no changes today.  6. Chronic diastolic CHF Fluid status appears stable today.   Follow up in the AF clinic in 6 months.    [facility] [address] [place], [place] [address] [phone] 11/28/2022 11:34 AM

## 2022-11-28 NOTE — Patient Instructions (Signed)
Call when stopping Tikosyn so we can update your chart.

## 2022-11-29 ENCOUNTER — Telehealth (HOSPITAL_COMMUNITY): Payer: Self-pay | Admitting: *Deleted

## 2022-11-29 NOTE — Telephone Encounter (Signed)
Patient's last dose of tikosyn will be on Sunday, February 11th.

## 2022-12-04 MED ORDER — DOFETILIDE 500 MCG PO CAPS: 500.0000 ug | ORAL_CAPSULE | Freq: Two times a day (BID) | ORAL | Status: DC

## 2022-12-04 NOTE — Addendum Note (Signed)
Addended by: Juluis Mire on: 12/04/2022 10:59 AM   Modules accepted: Orders

## 2022-12-26 ENCOUNTER — Telehealth: Payer: Self-pay | Admitting: Adult Health

## 2022-12-26 NOTE — Telephone Encounter (Signed)
[facility] calling to see if we got fax'd over Surgical clearance. Pls call @ [phone]  Jarold Song # [phone]  They sent this in Jan.   I asked her to re-fax. She will

## 2022-12-27 ENCOUNTER — Telehealth: Payer: Self-pay

## 2022-12-27 NOTE — Telephone Encounter (Signed)
Called and left voicemail for patient to call office back to schedule OV with TP or Dr Elsworth Soho to get cleared for knee surgery.

## 2022-12-27 NOTE — Telephone Encounter (Signed)
Just recived the fax. Will send office notes when cleared. Nothing further needed

## 2023-01-01 NOTE — Telephone Encounter (Signed)
Called and left patient a voicemail to call office back to schedule surgical clearance

## 2023-01-03 ENCOUNTER — Ambulatory Visit (INDEPENDENT_AMBULATORY_CARE_PROVIDER_SITE_OTHER): Payer: Medicare Other | Admitting: Pulmonary Disease

## 2023-01-03 ENCOUNTER — Encounter (HOSPITAL_BASED_OUTPATIENT_CLINIC_OR_DEPARTMENT_OTHER): Payer: Self-pay | Admitting: Pulmonary Disease

## 2023-01-03 VITALS — BP 120/64 | HR 68 | Temp 98.1°F | Ht 67.5 in | Wt 222.2 lb

## 2023-01-03 DIAGNOSIS — J984 Other disorders of lung: Secondary | ICD-10-CM | POA: Diagnosis not present

## 2023-01-03 NOTE — Telephone Encounter (Signed)
OV notes and clearance form have been faxed back to [facility]. Nothing further needed at this time.  

## 2023-01-03 NOTE — Patient Instructions (Signed)
Cleared for surgery with due risk

## 2023-01-03 NOTE — Telephone Encounter (Signed)
Pt seen today 3/15 by Dr. Elsworth Soho and has been cleared for surgery. Routing to surgical clearance pool so they can take care of getting this faxed to pt's surgeon.

## 2023-01-03 NOTE — Assessment & Plan Note (Signed)
Related to obesity, radiation and lobectomy. She has done well without bronchodilators and with weight loss

## 2023-01-03 NOTE — Progress Notes (Signed)
   Subjective:    Patient ID: [name], female    DOB: 02-07-1956, 67 y.o.   MRN: [id number]  HPI  67 yo never smoker for FU of dyspnea attributed to restrictive lung disease    PMH -  LUlobectomy for spont pneumothx in 07/2006, s/p chemo/ XRT for stg III B lung cancer in 2001 - in remission since!   - chronic pericarditis since 2016- NSAIDs, colchicine and steroids, was referred to [facility]. Serology neg , recurrence 2022 -Episcleritis on Imuran -CAD -cardiac cath 06/2015 required 2 stents, repeat 06/2017,  moderate aortic regurgitation -Paroxysmal atrial fibrillation - EGD 11/2017 which showed mild narrowing of the mid esophagus without mass -Hemoptysis since March 2022-ENT laryngoscopy negative, GI endoscopy negative, bronchoscopy negative   Chief Complaint  Patient presents with   Follow-up    Pt is here for a surgical clearance appt.  Pt is going to be having knee replacement surgery.   77-month follow-up visit. She requires surgical clearance for left knee replacement surgery by Dr. Berenice Primas. Interim she underwent cholecystectomy 09/2022 and did not have any complications. She continues to have occasional trace blood-tinged sputum especially when she brushes her teeth in the morning.  She remains on apixaban. Previously she has undergone bronchoscopy and EGD which were all negative. We reviewed her last CT from 06/2022 which does not show any evidence of recurrent malignancy. Her breathing is improved. She has lost about 10 pounds to her current weight of 222 pounds  Significant tests/ events reviewed  Bronchoscopy 07/2021 Friable mucosa  CT chest wo con 06/2022 >> treatment changes in the left apex with scarring and bronchiectasis similar to previous exam, residual changes of lung resection on the right CT chest with contrast 01/2020  >>esophageal air fluid level suggests dysmotility or gastroesophageal reflux   02/2020 HST AHI 6/h 8/202022 NPSG AHI 8/h - 232 lbs  Review of  Systems neg for any significant sore throat, dysphagia, itching, sneezing, nasal congestion or excess/ purulent secretions, fever, chills, sweats, unintended wt loss, pleuritic or exertional cp, hempoptysis, orthopnea pnd or change in chronic leg swelling. Also denies presyncope, palpitations, heartburn, abdominal pain, nausea, vomiting, diarrhea or change in bowel or urinary habits, dysuria,hematuria, rash, arthralgias, visual complaints, headache, numbness weakness or ataxia.     Objective:   Physical Exam  Gen. Pleasant, obese, in no distress ENT - no lesions, no post nasal drip Neck: No JVD, no thyromegaly, no carotid bruits Lungs: no use of accessory muscles, no dullness to percussion, decreased without rales or rhonchi  Cardiovascular: Rhythm regular, heart sounds  normal, no murmurs or gallops, no peripheral edema Musculoskeletal: No deformities, no cyanosis or clubbing , no tremors       Assessment & Plan:    Surgical clearance -she is cleared for left knee surgery from a pulmonary standpoint she would need cardiac clearance due to her small pericardial effusion and atrial fibrillation issues. OSA was minimal and she has lost some weight, routine perioperative precautions for OSA should be undertaken. She does have restrictive lung disease and oxygen monitoring would be recommended

## 2023-01-27 ENCOUNTER — Telehealth: Payer: Self-pay | Admitting: Interventional Cardiology

## 2023-01-27 NOTE — Telephone Encounter (Signed)
   Pre-operative Risk Assessment    Patient Name: [name]  DOB: 1956-04-01 MRN: [id number]      Request for Surgical Clearance    Procedure:   Left Total Knee Replacement  Date of Surgery:  Clearance TBD                                 Surgeon:  Dr. Jodi Geralds Surgeon's Group or Practice Name:  [facility] Phone number:  [phone] Fax number:  [phone]   Type of Clearance Requested:   - Medical  - Pharmacy:  Hold (Unclear on medication-thinks it is eliquis)      Type of Anesthesia:  Spinal   Additional requests/questions:    Milinda Pointer   01/27/2023, 11:49 AM

## 2023-01-28 ENCOUNTER — Telehealth: Payer: Self-pay | Admitting: Pulmonary Disease

## 2023-01-28 ENCOUNTER — Telehealth: Payer: Self-pay | Admitting: *Deleted

## 2023-01-28 NOTE — Telephone Encounter (Signed)
Pt has been scheduled for tele pre op appt 02/04/23 @ 2:40. Med rec and consent are done.     Patient Consent for Virtual Visit        [name] has provided verbal consent on 01/28/2023 for a virtual visit (video or telephone).   CONSENT FOR VIRTUAL VISIT FOR:  [name]  By participating in this virtual visit I agree to the following:  I hereby voluntarily request, consent and authorize [facility] and its employed or contracted physicians, physician assistants, nurse practitioners or other licensed health care professionals (the Practitioner), to provide me with telemedicine health care services (the "Services") as deemed necessary by the treating Practitioner. I acknowledge and consent to receive the Services by the Practitioner via telemedicine. I understand that the telemedicine visit will involve communicating with the Practitioner through live audiovisual communication technology and the disclosure of certain medical information by electronic transmission. I acknowledge that I have been given the opportunity to request an in-person assessment or other available alternative prior to the telemedicine visit and am voluntarily participating in the telemedicine visit.  I understand that I have the right to withhold or withdraw my consent to the use of telemedicine in the course of my care at any time, without affecting my right to future care or treatment, and that the Practitioner or I may terminate the telemedicine visit at any time. I understand that I have the right to inspect all information obtained and/or recorded in the course of the telemedicine visit and may receive copies of available information for a reasonable fee.  I understand that some of the potential risks of receiving the Services via telemedicine include:  Delay or interruption in medical evaluation due to technological equipment failure or disruption; Information transmitted may not be sufficient (e.g. poor  resolution of images) to allow for appropriate medical decision making by the Practitioner; and/or  In rare instances, security protocols could fail, causing a breach of personal health information.  Furthermore, I acknowledge that it is my responsibility to provide information about my medical history, conditions and care that is complete and accurate to the best of my ability. I acknowledge that Practitioner's advice, recommendations, and/or decision may be based on factors not within their control, such as incomplete or inaccurate data provided by me or distortions of diagnostic images or specimens that may result from electronic transmissions. I understand that the practice of medicine is not an exact science and that Practitioner makes no warranties or guarantees regarding treatment outcomes. I acknowledge that a copy of this consent can be made available to me via my patient portal [facility] MyChart), or I can request a printed copy by calling the office of [facility].    I understand that my insurance will be billed for this visit.   I have read or had this consent read to me. I understand the contents of this consent, which adequately explains the benefits and risks of the Services being provided via telemedicine.  I have been provided ample opportunity to ask questions regarding this consent and the Services and have had my questions answered to my satisfaction. I give my informed consent for the services to be provided through the use of telemedicine in my medical care   `

## 2023-01-28 NOTE — Telephone Encounter (Signed)
Patient with diagnosis of atrial fibrillation on Eliquis for anticoagulation.    Procedure: left total knee replacement Date of procedure: TBD   CHA2DS2-VASc Score = 5   This indicates a 7.2% annual risk of stroke. The patient's score is based upon: CHF History: 1 HTN History: 1 Diabetes History: 1 Stroke History: 0 Vascular Disease History: 1 Age Score: 0 Gender Score: 1   CrCl 100 Platelet count 260  Per office protocol, patient can hold Eliquis for 3 days prior to procedure.   Patient will not need bridging with Lovenox (enoxaparin) around procedure.  **This guidance is not considered finalized until pre-operative APP has relayed final recommendations.**

## 2023-01-28 NOTE — Telephone Encounter (Signed)
OV notes and clearance form have been faxed back to Dr. Luiz Blare at [facility]. Nothing further needed at this time.

## 2023-01-28 NOTE — Telephone Encounter (Signed)
Fax received from Dr. Milly Jakob with [facility] to perform a Left Total Knee Arthroplasty on patient.  Patient needs surgery clearance. Surgery is Pending. Patient was seen on 01/03/2023. Office protocol is a risk assessment can be sent to surgeon if patient has been seen in 60 days or less.   Sending to Dr. Vassie Loll for risk assessment or recommendations if patient needs to be seen in office prior to surgical procedure.    Dr. Vassie Loll just sending this as an FYI. I see where you have cleared pt in the last office visit

## 2023-01-28 NOTE — Telephone Encounter (Signed)
Pt has been scheduled for tele pre op appt 02/04/23 @ 2:40. Med rec and consent are done.

## 2023-01-28 NOTE — Telephone Encounter (Signed)
   Name: [name]  DOB: 07/18/1956  MRN: [id number]  Primary Cardiologist: Lesleigh Noe, MD (Inactive)   Preoperative team, please contact this patient and set up a phone call appointment for further preoperative risk assessment. Please obtain consent and complete medication review. Thank you for your help.  I confirm that guidance regarding antiplatelet and oral anticoagulation therapy has been completed and, if necessary, noted below.  Per office protocol, patient can hold Eliquis for 3 days prior to procedure.   Patient will not need bridging with Lovenox (enoxaparin) around procedure.   Ronney Asters, NP 01/28/2023, 3:46 PM [facility]

## 2023-02-04 ENCOUNTER — Ambulatory Visit: Payer: BC Managed Care – PPO | Attending: Interventional Cardiology

## 2023-02-04 DIAGNOSIS — R0609 Other forms of dyspnea: Secondary | ICD-10-CM | POA: Diagnosis not present

## 2023-02-04 NOTE — Progress Notes (Addendum)
Virtual Visit via Telephone Note   Because of [name]'s co-morbid illnesses, she is at least at moderate risk for complications without adequate follow up.  This format is felt to be most appropriate for this patient at this time.  The patient did not have access to video technology/had technical difficulties with video requiring transitioning to audio format only (telephone).  All issues noted in this document were discussed and addressed.  No physical exam could be performed with this format.  Please refer to the patient's chart for her consent to telehealth for [facility].  Evaluation Performed:  Preoperative cardiovascular risk assessment _____________   Date:  02/04/2023   Patient ID:  [name], DOB 1956/06/08, MRN [id number] Patient Location:  Home Provider location:   Office  Primary Care Provider:  Ollen Bowl, MD Primary Cardiologist:  Lesleigh Noe, MD (Inactive)  Chief Complaint / Patient Profile   67 y.o. y/o female with a h/o CAD status post DES to M CX and M LAD 07/11/2015 (patent stents on repeat cardiac cath 11/2016), chronic pericardial effusion, history of chronic pericarditis with chronic pericardial rub treated intermittently with steroids and colchicine, iritis, PAF on Eliquis and dofetilide therapy status post ablation 02/2022, chronic diastolic CHF, DM, HTN, remote history of treated non-small cell lung cancer, pulmonary fibrosis, aortic insufficiency, COPD, and anemia who is pending  Left Total Knee Replacement and presents today for telephonic preoperative cardiovascular risk assessment.  History of Present Illness    [name] is a 67 y.o. female who presents via audio/video conferencing for a telehealth visit today.  Pt was last seen in cardiology clinic on 08/05/2022 by Dr. Katrinka Blazing.  At that time [name] was doing well.  She stated that since ablation shortness of breath had significantly improved.  The patient is now pending  procedure as outlined above. Since her last visit, she initially told me that she has not had any issues with chest pain or shortness of breath but later on in our discussion she states that she has been increasingly more fatigued with activity.  She states that this is similar to when she had her stents placed back in 2016.  She can climb a flight of stairs but would have to stop halfway.  She typically does have to stop halfway through tasks to sit down and rest.  She has had some neck pain and sometimes her arms feel heavy.  She states that occasionally her heart rate has gotten up to 120s but then quickly gone back down to under 100.  She was sitting watching a TV program at this time.  She denies chest tightness.   Per office protocol, patient can hold Eliquis for 3 days prior to procedure.   Patient will not need bridging with Lovenox (enoxaparin) around procedure.  Past Medical History    Past Medical History:  Diagnosis Date   Allergy    Anemia    Anemia    Aortic insufficiency    a. Mod by echo 05/2015.   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 Abnormal stress test -> LHc 06/2015 s/p DES to mCX and mLAD, residual D2 disease treated medially.   Diverticulitis  Elevated blood pressure    Erythema nodosum    Heart murmur    History of kidney stones    Insomnia    lung ca dx'd 10/1999   chemo/xrt comp 05/29/2000   Myalgia    Obesity    Pericardial effusion    a. 05/2015 Echo: EF 55-60%, no rwma, Gr 1 DD, no effusion - but an effusion was seen on CT which was felt to be increased in size compared to 12/2014 - pericardium also thickened.    Pericarditis    a. Dx 05/2015.   Pneumothorax, spontaneous, tension 01/20/2012   October, 2010   PONV (postoperative  nausea and vomiting)    Pulmonary fibrosis (HCC) 01/20/2012   Due to previous surgery and chest radiation for lung cancer   Restrictive lung disease    Type II diabetes mellitus [facility])    Past Surgical History:  Procedure Laterality Date   ABDOMINAL HYSTERECTOMY  10/21/2006   APPENDECTOMY  1969?   ATRIAL FIBRILLATION ABLATION N/A 02/27/2022   Procedure: ATRIAL FIBRILLATION ABLATION;  Surgeon: Regan Lemming, MD;  Location: [facility] INVASIVE CV LAB;  Service: Cardiovascular;  Laterality: N/A;   BRONCHIAL WASHINGS  08/03/2021   Procedure: BRONCHIAL WASHINGS;  Surgeon: Oretha Milch, MD;  Location: [facility] ENDOSCOPY;  Service: Cardiopulmonary;;   CARDIAC CATHETERIZATION N/A 07/11/2015   Procedure: Left Heart Cath and Coronary Angiography;  Surgeon: Corky Crafts, MD;  Location: [facility] INVASIVE CV LAB;  Service: Cardiovascular;  Laterality: N/A;   CARDIAC CATHETERIZATION  07/11/2015   Procedure: Coronary Stent Intervention;  Surgeon: Corky Crafts, MD;  Location: [facility] INVASIVE CV LAB;  Service: Cardiovascular;;   CARPAL TUNNEL RELEASE Bilateral 1998-2005?   right-left   CARPAL TUNNEL RELEASE Left 2009?   "for the 2nd time"   CHOLECYSTECTOMY N/A 10/01/2022   Procedure: LAPAROSCOPIC CHOLECYSTECTOMY;  Surgeon: Berna Bue, MD;  Location: [facility] ORS;  Service: General;  Laterality: N/A;   COLONOSCOPY     COLONOSCOPY WITH PROPOFOL N/A 05/28/2018   Procedure: COLONOSCOPY WITH PROPOFOL;  Surgeon: Rachael Fee, MD;  Location: [facility] ENDOSCOPY;  Service: Endoscopy;  Laterality: N/A;   CORONARY ANGIOPLASTY     CYSTOSCOPY W/ STONE MANIPULATION  10/22/2011   ESOPHAGOGASTRODUODENOSCOPY (EGD) WITH ESOPHAGEAL DILATION  10/21/2010   FOOT SURGERY Bilateral 06/22/1979   Callous removed    INNER EAR SURGERY Right ~ 2009   KNEE ARTHROPLASTY Right 10/21/1989   KNEE ARTHROSCOPY  ~ 2000   LATERAL RELEASE   LEFT HEART CATH AND CORONARY ANGIOGRAPHY N/A 12/18/2016   Procedure: Left Heart Cath and Coronary  Angiography;  Surgeon: Kathleene Hazel, MD;  Location: [facility] INVASIVE CV LAB;  Service: Cardiovascular;  Laterality: N/A;   LEFT HEART CATH AND CORONARY ANGIOGRAPHY N/A 02/01/2021   Procedure: LEFT HEART CATH AND CORONARY ANGIOGRAPHY;  Surgeon: Marykay Lex, MD;  Location: [facility] INVASIVE CV LAB;  Service: Cardiovascular;  Laterality: N/A;   LUNG CANCER SURGERY Left 10/22/1999   "small cell"   LUNG SURGERY Right 10/21/2005   "reinflated it"   PULMONARY EMBOLISM SURGERY  10/21/2005   LUNG COLLAPSE   RIGHT AND LEFT HEART CATH N/A 06/25/2017   Procedure: RIGHT AND LEFT HEART CATH;  Surgeon: Tonny Bollman, MD;  Location: [facility] INVASIVE CV LAB;  Service: Cardiovascular;  Laterality: N/A;   SHOULDER ADHESION RELEASE Right ~ 2004   SHOULDER ARTHROSCOPY W/ ROTATOR CUFF REPAIR Right ~ 2003   UPPER GASTROINTESTINAL ENDOSCOPY  06/05/2021   VIDEO BRONCHOSCOPY N/A 08/03/2021   Procedure:  VIDEO BRONCHOSCOPY WITHOUT FLUORO;  Surgeon: Oretha Milch, MD;  Location: [facility] ENDOSCOPY;  Service: Cardiopulmonary;  Laterality: N/A;    Allergies  Allergies  Allergen Reactions   Hydrocodone Hives and Itching   Morphine And Related Other (See Comments)    GI PROBLEMS   Crestor [Rosuvastatin] Other (See Comments)    Pt reports causes lower extremity muscle aches/cramping   Invokana [Canagliflozin] Diarrhea   Metformin Hcl Diarrhea   Lisinopril Cough   Nsaids Other (See Comments)    GI PROBLEMS    Home Medications    Prior to Admission medications   Medication Sig Start Date End Date Taking? Authorizing Provider  ACCU-CHEK GUIDE test strip  05/23/22   [provider]  Accu-Chek Softclix Lancets lancets USE LANCETS 3 TIMES A DAY AS DIRECTED 30 DAYS 11/12/19   [provider]  acetaminophen (TYLENOL) 650 MG CR tablet Take 1,300 mg by mouth every 8 (eight) hours as needed for pain (headache).    [provider]  apixaban (ELIQUIS) 5 MG TABS tablet TAKE 1 TABLET BY MOUTH TWICE A DAY  01/30/22   Camnitz, Andree Coss, MD  B-D UF III MINI PEN NEEDLES 31G X 5 MM MISC 2 (two) times daily. as directed 10/31/18   [provider]  Blood Glucose Monitoring Suppl (ACCU-CHEK GUIDE) w/Device KIT See admin instructions. 11/12/19   [provider]  co-enzyme Q-10 50 MG capsule TAKE 1 CAPSULE BY MOUTH EVERY DAY 11/30/19   Lars Masson, MD  diltiazem (CARDIZEM CD) 360 MG 24 hr capsule TAKE 1 CAPSULE BY MOUTH EVERY DAY 07/19/22   Camnitz, Andree Coss, MD  dofetilide (TIKOSYN) 500 MCG capsule Take 1 capsule (500 mcg total) by mouth 2 (two) times daily. 12/04/22   Fenton, Clint R, PA  furosemide (LASIX) 40 MG tablet Take 1 tablet (40 mg total) by mouth daily as needed for fluid or edema. 03/15/21   Lyn Records, MD  levothyroxine (SYNTHROID, LEVOTHROID) 50 MCG tablet Take 50 mcg by mouth daily before breakfast.  05/14/17   [provider]  losartan (COZAAR) 25 MG tablet TAKE 1 TABLET BY MOUTH EVERY DAY 11/20/21   Lyn Records, MD  Menthol, Topical Analgesic, (BIOFREEZE EX) Apply 1 Application topically daily as needed (pain).    [provider]  metoprolol succinate (TOPROL-XL) 50 MG 24 hr tablet TAKE 1.5 TABLETS (75 MG TOTAL) BY MOUTH AT BEDTIME. TAKE WITH OR IMMEDIATELY FOLLOWING A MEAL. 11/20/22   Camnitz, Andree Coss, MD  nitroGLYCERIN (NITROSTAT) 0.4 MG SL tablet Place 0.4 mg under the tongue every 5 (five) minutes as needed for chest pain.    [provider]  NOVOLOG MIX 70/30 FLEXPEN (70-30) 100 UNIT/ML FlexPen Inject 35-40 Units into the skin See admin instructions. Inject 40 units in the morning and 35 units at night 04/22/22   [provider]  OZEMPIC, 0.25 OR 0.5 MG/DOSE, 2 MG/1.5ML SOPN INJECT 0.5 MG INTO THE SKIN ONCE A WEEK. 03/30/19   Dorothyann Peng, MD  pantoprazole (PROTONIX) 40 MG tablet Take 1 tablet (40 mg total) by mouth daily before breakfast. 08/06/22   Esterwood, Amy S, PA-C  pravastatin (PRAVACHOL) 20 MG tablet TAKE 1 TABLET  BY MOUTH EVERY DAY 11/20/21   Lyn Records, MD  spironolactone (ALDACTONE) 25 MG tablet TAKE 1 TABLET (25 MG TOTAL) BY MOUTH DAILY. 07/23/22   Graciella Freer, PA-C  traMADol (ULTRAM) 50 MG tablet Take 50 mg by mouth every 6 (six) hours as needed  for moderate pain. 09/12/22   [provider]    Physical Exam    Vital Signs:  [name] does not have vital signs available for review today.  Given telephonic nature of communication, physical exam is limited. AAOx3. NAD. Normal affect.  Speech and respirations are unlabored.  Accessory Clinical Findings    None  Assessment & Plan    1.  Preoperative Cardiovascular Risk Assessment:  [name] perioperative risk of a major cardiac event is 6.6% according to the Revised Cardiac Risk Index (RCRI).  Therefore, she is at high risk for perioperative complications.   Her functional capacity is good at 5.07 METs according to the Duke Activity Status Index (DASI). Recommendations: Echo was stable without any significant changes.  Okay to proceed without any further cardiovascular testing.   Antiplatelet and/or Anticoagulation Recommendations:  Eliquis (Apixaban) can be held for 3 days prior to surgery.  Please resume post op when felt to be safe.     A copy of this note will be routed to requesting surgeon.  Time:   Today, I have spent 16 minutes with the patient with telehealth technology discussing medical history, symptoms, and management plan.     Sharlene Dory, PA-C  02/04/2023, 2:41 PM

## 2023-02-05 ENCOUNTER — Ambulatory Visit (HOSPITAL_COMMUNITY): Payer: Medicare Other | Attending: Cardiology

## 2023-02-05 DIAGNOSIS — R0609 Other forms of dyspnea: Secondary | ICD-10-CM | POA: Insufficient documentation

## 2023-02-05 LAB — ECHOCARDIOGRAM COMPLETE
Area-P 1/2: 5.27 cm2
P 1/2 time: 338 msec
S' Lateral: 3.3 cm

## 2023-02-13 ENCOUNTER — Other Ambulatory Visit (HOSPITAL_COMMUNITY): Payer: Self-pay | Admitting: *Deleted

## 2023-03-06 ENCOUNTER — Other Ambulatory Visit: Payer: Self-pay | Admitting: Orthopedic Surgery

## 2023-03-11 ENCOUNTER — Other Ambulatory Visit: Payer: Self-pay | Admitting: Cardiology

## 2023-03-11 DIAGNOSIS — I4891 Unspecified atrial fibrillation: Secondary | ICD-10-CM

## 2023-03-12 NOTE — Telephone Encounter (Signed)
Eliquis 5mg  refill request received. Patient is 67 years old, weight-100.8kg, Crea-0.90 on 11/28/22, Diagnosis-Afib, and last seen by Alphonzo Severance on 11/28/22. Dose is appropriate based on dosing criteria. Will send in refill to requested pharmacy.

## 2023-03-14 NOTE — Patient Instructions (Addendum)
DUE TO COVID-19 ONLY TWO VISITORS  (aged 65 and older)  ARE ALLOWED TO COME WITH YOU AND STAY IN THE WAITING ROOM ONLY DURING PRE OP AND PROCEDURE.   **NO VISITORS ARE ALLOWED IN THE SHORT STAY AREA OR RECOVERY ROOM!!**  IF YOU WILL BE ADMITTED INTO THE HOSPITAL YOU ARE ALLOWED ONLY FOUR SUPPORT PEOPLE DURING VISITATION HOURS ONLY (7 AM -8PM)   The support person(s) must pass our screening, gel in and out, and wear a mask at all times, including in the patient's room. Patients must also wear a mask when staff or their support person are in the room. Visitors GUEST BADGE MUST BE WORN VISIBLY  One adult visitor may remain with you overnight and MUST be in the room by 8 P.M.     Your procedure is scheduled on: 04/14/23   Report to [facility] Main Entrance    Report to admitting at 7:45 AM   Call this number if you have problems the morning of surgery [phone]   Do not eat food :After Midnight.   After Midnight you may have the following liquids until _7:15_____ AM/  DAY OF SURGERY  Water Black Coffee (sugar ok, NO MILK/CREAM OR CREAMERS)  Tea (sugar ok, NO MILK/CREAM OR CREAMERS) regular and decaf                             Plain Jell-O (NO RED)                                           Fruit ices (not with fruit pulp, NO RED)                                     Popsicles (NO RED)                                                                  Juice: apple, WHITE grape, WHITE cranberry Sports drinks like Gatorade (NO RED)                  The day of surgery:  Drink ONE  G2 at  7:00 AM the morning of surgery. Drink in one sitting. Do not sip.  This drink was given to you during your hospital  pre-op appointment visit. Nothing else to drink after completing the   G2. At 7:15 AM          If you have questions, please contact your surgeon's office.   FOLLOW BOWEL PREP AND ANY ADDITIONAL PRE OP INSTRUCTIONS YOU RECEIVED FROM YOUR SURGEON'S OFFICE!!!     Oral  Hygiene is also important to reduce your risk of infection.                                    Remember - BRUSH YOUR TEETH THE MORNING OF SURGERY WITH YOUR REGULAR TOOTHPASTE  DENTURES WILL BE REMOVED PRIOR TO SURGERY PLEASE DO NOT APPLY "Poly grip"  OR ADHESIVES!!!   Do NOT smoke after Midnight    Take these medicines the morning of surgery with A SIP OF WATER: Pravastatin, Metoprolol, Diltiazem, Levothyroxine, Pantoprazole    Before surgery.Stop taking _Eliquis hold 72 hours (3 Days) last dose is 6/20/24__________on __________as instructed by _____________.  Stop taking ____________as directed by your Surgeon/Cardiologist.  Contact your Surgeon/Cardiologist for instructions on Anticoagulant Therapy prior to surgery.  How to Manage Your Diabetes Before and After Surgery  Why is it important to control my blood sugar before and after surgery? Improving blood sugar levels before and after surgery helps healing and can limit problems. A way of improving blood sugar control is eating a healthy diet by:  Eating less sugar and carbohydrates  Increasing activity/exercise  Talking with your doctor about reaching your blood sugar goals High blood sugars (greater than 180 mg/dL) can raise your risk of infections and slow your recovery, so you will need to focus on controlling your diabetes during the weeks before surgery. Make sure that the doctor who takes care of your diabetes knows about your planned surgery including the date and location.  How do I manage my blood sugar before surgery? Check your blood sugar at least 4 times a day, starting 2 days before surgery, to make sure that the level is not too high or low. Check your blood sugar the morning of your surgery when you wake up and every 2 hours until you get to the Short Stay unit. If your blood sugar is less than 70 mg/dL, you will need to treat for low blood sugar: Do not take insulin. Treat a low blood sugar (less than 70 mg/dL)  with  cup of clear juice (cranberry or apple), 4 glucose tablets, OR glucose gel. Recheck blood sugar in 15 minutes after treatment (to make sure it is greater than 70 mg/dL). If your blood sugar is not greater than 70 mg/dL on recheck, call [phone] for further instructions. Report your blood sugar to the short stay nurse when you get to Short Stay.  If you are admitted to the hospital after surgery: Your blood sugar will be checked by the staff and you will probably be given insulin after surgery (instead of oral diabetes medicines) to make sure you have good blood sugar levels. The goal for blood sugar control after surgery is 80-180 mg/dL.   WHAT DO I DO ABOUT MY DIABETES MEDICATION?  Do not take oral diabetes medicines (pills) the morning of surgery.  THE NIGHT BEFORE SURGERY, take  14  units of Novolog 70/30      insulin.       THE MORNING OF SURGERY, take 0  units of   insulin.  DO NOT TAKE THE FOLLOWING 7 DAYS PRIOR TO SURGERY: Ozempic, Wegovy, Rybelsus (Semaglutide), Byetta (exenatide), Bydureon (exenatide ER), Victoza, Saxenda (liraglutide), or Trulicity (dulaglutide) Mounjaro (Tirzepatide) Adlyxin (Lixisenatide), Polyethylene Glycol Loxenatide.last dose 6/15   Bring CPAP mask and tubing day of surgery.                              You may not have any metal on your body including hair pins, jewelry, and body piercing             Do not wear make-up, lotions, powders, perfumes or deodorant  Do not wear nail polish including gel and S&S, artificial/acrylic nails, or any other type of covering on natural nails including finger and toenails. If  you have artificial nails, gel coating, etc. that needs to be removed by a nail salon please have this removed prior to surgery or surgery may need to be canceled/ delayed if the surgeon/ anesthesia feels like they are unable to be safely monitored.   Do not shave  48 hours prior to surgery.               Men may shave face and  neck.   Do not bring valuables to the hospital. [facility] IS NOT             RESPONSIBLE   FOR VALUABLES.   Contacts, glasses, or bridgework may not be worn into surgery.   Bring small overnight bag day of surgery.   DO NOT BRING YOUR HOME MEDICATIONS TO THE HOSPITAL. PHARMACY WILL DISPENSE MEDICATIONS LISTED ON YOUR MEDICATION LIST TO YOU DURING YOUR ADMISSION IN THE HOSPITAL!    Patients discharged on the day of surgery will not be allowed to drive home.  Someone NEEDS to stay with you for the first 24 hours after anesthesia.   Special Instructions: Bring a copy of your healthcare power of attorney and living will documents the day of surgery if you haven't scanned them before.              Please read over the following fact sheets you were given: IF YOU HAVE QUESTIONS ABOUT YOUR PRE-OP INSTRUCTIONS PLEASE CALL [phone]    Pre-operative 5 CHG Bath Instructions   You can play a key role in reducing the risk of infection after surgery. Your skin needs to be as free of germs as possible. You can reduce the number of germs on your skin by washing with CHG (chlorhexidine gluconate) soap before surgery. CHG is an antiseptic soap that kills germs and continues to kill germs even after washing.   DO NOT use if you have an allergy to chlorhexidine/CHG or antibacterial soaps. If your skin becomes reddened or irritated, stop using the CHG and notify one of our RNs at [phone].   Please shower with the CHG soap starting 4 days before surgery using the following schedule:     Please keep in mind the following:  DO NOT shave, including legs and underarms, starting the day of your first shower.   You may shave your face at any point before/day of surgery.  Place clean sheets on your bed the day you start using CHG soap. Use a clean washcloth (not used since being washed) for each shower. DO NOT sleep with pets once you start using the CHG.   CHG Shower Instructions:  If you choose  to wash your hair and private area, wash first with your normal shampoo/soap.  After you use shampoo/soap, rinse your hair and body thoroughly to remove shampoo/soap residue.  Turn the water OFF and apply about 3 tablespoons (45 ml) of CHG soap to a CLEAN washcloth.  Apply CHG soap ONLY FROM YOUR NECK DOWN TO YOUR TOES (washing for 3-5 minutes)  DO NOT use CHG soap on face, private areas, open wounds, or sores.  Pay special attention to the area where your surgery is being performed.  If you are having back surgery, having someone wash your back for you may be helpful. Wait 2 minutes after CHG soap is applied, then you may rinse off the CHG soap.  Pat dry with a clean towel  Put on clean clothes/pajamas   If you choose to wear lotion, please use ONLY the  CHG-compatible lotions on the back of this paper.     Additional instructions for the day of surgery: DO NOT APPLY any lotions, deodorants, cologne, or perfumes.   Put on clean/comfortable clothes.  Brush your teeth.  Ask your nurse before applying any prescription medications to the skin.      CHG Compatible Lotions   Aveeno Moisturizing lotion  Cetaphil Moisturizing Cream  Cetaphil Moisturizing Lotion  Clairol Herbal Essence Moisturizing Lotion, Dry Skin  Clairol Herbal Essence Moisturizing Lotion, Extra Dry Skin  Clairol Herbal Essence Moisturizing Lotion, Normal Skin  Curel Age Defying Therapeutic Moisturizing Lotion with Alpha Hydroxy  Curel Extreme Care Body Lotion  Curel Soothing Hands Moisturizing Hand Lotion  Curel Therapeutic Moisturizing Cream, Fragrance-Free  Curel Therapeutic Moisturizing Lotion, Fragrance-Free  Curel Therapeutic Moisturizing Lotion, Original Formula  Eucerin Daily Replenishing Lotion  Eucerin Dry Skin Therapy Plus Alpha Hydroxy Crme  Eucerin Dry Skin Therapy Plus Alpha Hydroxy Lotion  Eucerin Original Crme  Eucerin Original Lotion  Eucerin Plus Crme Eucerin Plus Lotion  Eucerin TriLipid  Replenishing Lotion  Keri Anti-Bacterial Hand Lotion  Keri Deep Conditioning Original Lotion Dry Skin Formula Softly Scented  Keri Deep Conditioning Original Lotion, Fragrance Free Sensitive Skin Formula  Keri Lotion Fast Absorbing Fragrance Free Sensitive Skin Formula  Keri Lotion Fast Absorbing Softly Scented Dry Skin Formula  Keri Original Lotion  Keri Skin Renewal Lotion Keri Silky Smooth Lotion  Keri Silky Smooth Sensitive Skin Lotion  Nivea Body Creamy Conditioning Oil  Nivea Body Extra Enriched Lotion  Nivea Body Original Lotion  Nivea Body Sheer Moisturizing Lotion Nivea Crme  Nivea Skin Firming Lotion  NutraDerm 30 Skin Lotion  NutraDerm Skin Lotion  NutraDerm Therapeutic Skin Cream  NutraDerm Therapeutic Skin Lotion  ProShield Protective Hand Cream  Provon moisturizing lotion     Incentive Spirometer  An incentive spirometer is a tool that can help keep your lungs clear and active. This tool measures how well you are filling your lungs with each breath. Taking long deep breaths may help reverse or decrease the chance of developing breathing (pulmonary) problems (especially infection) following: A long period of time when you are unable to move or be active. BEFORE THE PROCEDURE  If the spirometer includes an indicator to show your best effort, your nurse or respiratory therapist will set it to a desired goal. If possible, sit up straight or lean slightly forward. Try not to slouch. Hold the incentive spirometer in an upright position. INSTRUCTIONS FOR USE  Sit on the edge of your bed if possible, or sit up as far as you can in bed or on a chair. Hold the incentive spirometer in an upright position. Breathe out normally. Place the mouthpiece in your mouth and seal your lips tightly around it. Breathe in slowly and as deeply as possible, raising the piston or the ball toward the top of the column. Hold your breath for 3-5 seconds or for as long as possible. Allow the  piston or ball to fall to the bottom of the column. Remove the mouthpiece from your mouth and breathe out normally. Rest for a few seconds and repeat Steps 1 through 7 at least 10 times every 1-2 hours when you are awake. Take your time and take a few normal breaths between deep breaths. The spirometer may include an indicator to show your best effort. Use the indicator as a goal to work toward during each repetition. After each set of 10 deep breaths, practice coughing to be sure your  lungs are clear. If you have an incision (the cut made at the time of surgery), support your incision when coughing by placing a pillow or rolled up towels firmly against it. Once you are able to get out of bed, walk around indoors and cough well. You may stop using the incentive spirometer when instructed by your caregiver.  RISKS AND COMPLICATIONS Take your time so you do not get dizzy or light-headed. If you are in pain, you may need to take or ask for pain medication before doing incentive spirometry. It is harder to take a deep breath if you are having pain. AFTER USE Rest and breathe slowly and easily. It can be helpful to keep track of a log of your progress. Your caregiver can provide you with a simple table to help with this. If you are using the spirometer at home, follow these instructions: SEEK MEDICAL CARE IF:  You are having difficultly using the spirometer. You have trouble using the spirometer as often as instructed. Your pain medication is not giving enough relief while using the spirometer. You develop fever of 100.5 F (38.1 C) or higher. SEEK IMMEDIATE MEDICAL CARE IF:  You cough up bloody sputum that had not been present before. You develop fever of 102 F (38.9 C) or greater. You develop worsening pain at or near the incision site. MAKE SURE YOU:  Understand these instructions. Will watch your condition. Will get help right away if you are not doing well or get worse. Document  Released: 02/17/2007 Document Revised: 12/30/2011 Document Reviewed: 04/20/2007 [facility] Patient Information 2014 [place], [place].     ________________________________________________________________________

## 2023-03-19 ENCOUNTER — Other Ambulatory Visit: Payer: Self-pay | Admitting: Internal Medicine

## 2023-03-19 ENCOUNTER — Encounter (HOSPITAL_COMMUNITY): Payer: Self-pay

## 2023-03-19 DIAGNOSIS — Z1231 Encounter for screening mammogram for malignant neoplasm of breast: Secondary | ICD-10-CM

## 2023-03-20 ENCOUNTER — Ambulatory Visit
Admission: RE | Admit: 2023-03-20 | Discharge: 2023-03-20 | Disposition: A | Discharge status: Home / Self Care | Payer: Medicare Other | Source: Ambulatory Visit | Attending: Internal Medicine | Admitting: Internal Medicine

## 2023-03-20 DIAGNOSIS — Z1231 Encounter for screening mammogram for malignant neoplasm of breast: Secondary | ICD-10-CM

## 2023-04-01 ENCOUNTER — Ambulatory Visit (HOSPITAL_COMMUNITY)
Admission: RE | Admit: 2023-04-01 | Discharge: 2023-04-01 | Disposition: A | Discharge status: Home / Self Care | Payer: Medicare Other | Source: Ambulatory Visit | Attending: Orthopedic Surgery | Admitting: Orthopedic Surgery

## 2023-04-01 ENCOUNTER — Encounter (HOSPITAL_COMMUNITY)
Admission: RE | Admit: 2023-04-01 | Discharge: 2023-04-01 | Disposition: A | Discharge status: Home / Self Care | Payer: Medicare Other | Source: Ambulatory Visit | Attending: Orthopedic Surgery | Admitting: Orthopedic Surgery

## 2023-04-01 ENCOUNTER — Encounter (HOSPITAL_COMMUNITY): Payer: Self-pay

## 2023-04-01 ENCOUNTER — Other Ambulatory Visit: Payer: Self-pay

## 2023-04-01 DIAGNOSIS — Z794 Long term (current) use of insulin: Secondary | ICD-10-CM | POA: Diagnosis not present

## 2023-04-01 DIAGNOSIS — I129 Hypertensive chronic kidney disease with stage 1 through stage 4 chronic kidney disease, or unspecified chronic kidney disease: Secondary | ICD-10-CM | POA: Diagnosis not present

## 2023-04-01 DIAGNOSIS — Z85118 Personal history of other malignant neoplasm of bronchus and lung: Secondary | ICD-10-CM | POA: Diagnosis not present

## 2023-04-01 DIAGNOSIS — I2510 Atherosclerotic heart disease of native coronary artery without angina pectoris: Secondary | ICD-10-CM | POA: Insufficient documentation

## 2023-04-01 DIAGNOSIS — N183 Chronic kidney disease, stage 3 unspecified: Secondary | ICD-10-CM | POA: Diagnosis not present

## 2023-04-01 DIAGNOSIS — I4891 Unspecified atrial fibrillation: Secondary | ICD-10-CM | POA: Diagnosis not present

## 2023-04-01 DIAGNOSIS — Z01818 Encounter for other preprocedural examination: Secondary | ICD-10-CM | POA: Insufficient documentation

## 2023-04-01 DIAGNOSIS — E119 Type 2 diabetes mellitus without complications: Secondary | ICD-10-CM | POA: Diagnosis not present

## 2023-04-01 DIAGNOSIS — E1122 Type 2 diabetes mellitus with diabetic chronic kidney disease: Secondary | ICD-10-CM | POA: Insufficient documentation

## 2023-04-01 DIAGNOSIS — M1712 Unilateral primary osteoarthritis, left knee: Secondary | ICD-10-CM | POA: Insufficient documentation

## 2023-04-01 HISTORY — DX: Essential (primary) hypertension: I10

## 2023-04-01 LAB — BASIC METABOLIC PANEL
Anion gap: 8 (ref 5–15)
BUN: 25 mg/dL — ABNORMAL HIGH (ref 8–23)
CO2: 26 mmol/L (ref 22–32)
Calcium: 8.7 mg/dL — ABNORMAL LOW (ref 8.9–10.3)
Chloride: 103 mmol/L (ref 98–111)
Creatinine, Ser: 0.85 mg/dL (ref 0.44–1.00)
GFR, Estimated: >60 mL/min (ref >60)
Glucose, Bld: 83 mg/dL (ref 70–99)
Potassium: 4.2 mmol/L (ref 3.5–5.1)
Sodium: 137 mmol/L (ref 135–145)

## 2023-04-01 LAB — HEMOGLOBIN A1C
Hgb A1c MFr Bld: 5.9 % — ABNORMAL HIGH (ref 4.8–5.6)
Mean Plasma Glucose: 122.63 mg/dL

## 2023-04-01 LAB — CBC
HCT: 36.7 % (ref 36.0–46.0)
Hemoglobin: 11.7 g/dL — ABNORMAL LOW (ref 12.0–15.0)
MCH: 31 pg (ref 26.0–34.0)
MCHC: 31.9 g/dL (ref 30.0–36.0)
MCV: 97.3 fL (ref 80.0–100.0)
Platelets: 280 10*3/uL (ref 150–400)
RBC: 3.77 MIL/uL — ABNORMAL LOW (ref 3.87–5.11)
RDW: 11.5 % (ref 11.5–15.5)
WBC: 5.7 10*3/uL (ref 4.0–10.5)
nRBC: 0 % (ref 0.0–0.2)

## 2023-04-01 LAB — GLUCOSE, CAPILLARY
Glucose-Capillary: 61 mg/dL — ABNORMAL LOW (ref 70–99)
Glucose-Capillary: 73 mg/dL (ref 70–99)
Glucose-Capillary: 103 mg/dL — ABNORMAL HIGH (ref 70–99)

## 2023-04-01 LAB — SURGICAL PCR SCREEN
MRSA, PCR: NEGATIVE
Staphylococcus aureus: NEGATIVE

## 2023-04-01 NOTE — Progress Notes (Addendum)
Anesthesia note:   PCP - Dr. Estill Cotta Cardiologist -Dr. Mendel Ryder Other-   Chest x-ray - 04/01/23 EKG - 03/09/23-epic Stress Test - 02/20/22-epic ECHO - 02/05/23-epic Cardiac Cath - 2016 with 2 stents, 2018,2022 CABG-no Pacemaker/ICD device last checked:no  Sleep Study - yes CPAP - no  Last dose of Mounjaro will be 6/15 CBG at PAT visit-73, 64,-103 after snack was provided Fasting Blood Sugar at home-99-110 Checks Blood Sugar __QD___  Blood Thinner:Eliquis for a-fib Blood Thinner Instructions: hold 72 hours Aspirin Instructions: Last Dose:6/20  Anesthesia review: Yes /No  reason:  Patient denies shortness of breath, fever, cough and chest pain at PAT appointment. Pt has no SOB with activities. She forgot to eat dinner last night and her CBG was 73 at the PAT visit. I gave her a snack and tested 30 min later . CBG was 61. Peanut butter and chips given. 30 min later CBG was 103   Patient verbalized understanding of instructions that were given to them at the PAT appointment. Patient was also instructed that they will need to review over the PAT instructions again at home before surgery.yes. Pt was told to read the instructions after getting home. She will put clear polish on her nails. Policy was read to her.

## 2023-04-02 NOTE — Progress Notes (Signed)
Anesthesia Chart Review   Case: [id number] Date/Time: 04/14/23 0959   Procedure: TOTAL KNEE ARTHROPLASTY (Left: Knee)   Anesthesia type: Spinal   Pre-op diagnosis: LEFT KNEE DEGENERATIVE JOINT DISEASE   Location: [facility] / [facility]   Surgeons: Jodi Geralds, MD       DISCUSSION:67 y.o. former smoker with h/o PONV, HTN, DM II, CAD status post DES to M CX and M LAD 07/11/2015 (patent stents on repeat cardiac cath 11/2016) , atrial fibrillation, lung cancer (LUlobectomy for spont pneumothx in 07/2006, s/p chemo/ XRT for stg III B lung cancer in 2001 - in remission since), left knee djd scheduled for above procedure 04/14/23 with Dr. Jodi Geralds.   Per cardiology preoperative evaluation 02/04/2023, "Ms. Hochstetler's perioperative risk of a major cardiac event is 6.6% according to the Revised Cardiac Risk Index (RCRI).  Therefore, she is at high risk for perioperative complications.   Her functional capacity is good at 5.07 METs according to the Duke Activity Status Index (DASI). Recommendations: Echo was stable without any significant changes.  Okay to proceed without any further cardiovascular testing. Antiplatelet and/or Anticoagulation Recommendations: Eliquis (Apixaban) can be held for 3 days prior to surgery.  Please resume post op when felt to be safe."  Pt reports last dose of Eliquis 04/10/2023.   Pt last seen by pulmonology 01/03/2023. Per OV note, "Surgical clearance -she is cleared for left knee surgery from a pulmonary standpoint she would need cardiac clearance due to her small pericardial effusion and atrial fibrillation issues. OSA was minimal and she has lost some weight, routine perioperative precautions for OSA should be undertaken. She does have restrictive lung disease and oxygen monitoring would be recommended"  VS: BP (!) 151/74   Pulse 77   Temp 36.7 C (Oral)   Resp 18   Ht 5\' 7"  (1.702 m)   Wt 98.4 kg   SpO2 100%   BMI 33.99 kg/m   PROVIDERS: Ollen Bowl, MD is PCP    Primary Cardiologist:  Lesleigh Noe, MD   Cyril Mourning, MD is Pulmonologist  LABS: Labs reviewed: Acceptable for surgery. (all labs ordered are listed, but only abnormal results are displayed)  Labs Reviewed  HEMOGLOBIN A1C - Abnormal; Notable for the following components:      Result Value   Hgb A1c MFr Bld 5.9 (*)    All other components within normal limits  BASIC METABOLIC PANEL - Abnormal; Notable for the following components:   BUN 25 (*)    Calcium 8.7 (*)    All other components within normal limits  CBC - Abnormal; Notable for the following components:   RBC 3.77 (*)    Hemoglobin 11.7 (*)    All other components within normal limits  GLUCOSE, CAPILLARY - Abnormal; Notable for the following components:   Glucose-Capillary 61 (*)    All other components within normal limits  GLUCOSE, CAPILLARY - Abnormal; Notable for the following components:   Glucose-Capillary 103 (*)    All other components within normal limits  SURGICAL PCR SCREEN  GLUCOSE, CAPILLARY     IMAGES:   EKG:   CV: Echo 02/05/2023 1. Left ventricular ejection fraction, by estimation, is 55 to 60%. The  left ventricle has normal function. The left ventricle has no regional  wall motion abnormalities. Left ventricular diastolic parameters are  indeterminate.   2. Right ventricular systolic function is normal. The right ventricular  size is normal. There is normal pulmonary artery systolic pressure. The  estimated right ventricular systolic pressure is 15.4 mmHg.   3. The mitral valve is normal in structure. No evidence of mitral valve  regurgitation. No evidence of mitral stenosis.   4. The aortic valve is tricuspid. Aortic valve regurgitation is moderate.  No aortic stenosis is present.   5. Turbulence across the pulmonic valve with peak gradient 22 mmHg. The  pulmonic valve was abnormal. Mild pulmonic stenosis.   6. The inferior vena cava is normal in size with greater than 50%   respiratory variability, suggesting right atrial pressure of 3 mmHg.   7. No evidence for effusive/constrictive pericarditis. a small  pericardial effusion is present. The pericardial effusion is  circumferential.   Myocardial Perfusion 02/20/22   The study is normal. The study is low risk.   No ST deviation was noted.   Left ventricular function is normal. Nuclear stress EF: 63 %. The left ventricular ejection fraction is normal (55-65%). End diastolic cavity size is normal.   Prior study available for comparison from 09/27/2016. Past Medical History:  Diagnosis Date   Allergy    Anemia    Aortic insufficiency    a. Mod by echo 05/2015.   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 Abnormal stress test -> [facility]c 06/2015 s/p DES to mCX and mLAD, residual D2 disease treated medially.   Diverticulitis    Erythema nodosum    Heart murmur    History of kidney stones    Hypertension    Insomnia    lung ca dx'd 10/1999   chemo/xrt comp 05/29/2000   Myalgia    Obesity    Pericardial effusion    a. 05/2015 Echo: EF 55-60%, no rwma, Gr 1 DD, no effusion - but an effusion was seen on CT which was felt to be increased in size compared to 12/2014 - pericardium also thickened.    Pericarditis    a. Dx 05/2015.   Pneumothorax, spontaneous, tension 01/20/2012   October, 2010   PONV (postoperative nausea and vomiting)    Pulmonary fibrosis (HCC) 01/20/2012   Due to previous surgery and chest radiation for lung cancer   Type II diabetes mellitus [facility])     Past Surgical History:  Procedure Laterality Date   ABDOMINAL HYSTERECTOMY  10/21/2006   APPENDECTOMY  1969?   ATRIAL FIBRILLATION ABLATION N/A 02/27/2022   Procedure: ATRIAL FIBRILLATION ABLATION;  Surgeon: Regan Lemming, MD;  Location: [facility] INVASIVE CV LAB;  Service: Cardiovascular;  Laterality: N/A;   BRONCHIAL WASHINGS  08/03/2021   Procedure: BRONCHIAL WASHINGS;  Surgeon: Oretha Milch, MD;  Location: [facility] ENDOSCOPY;  Service: Cardiopulmonary;;   CARDIAC CATHETERIZATION N/A 07/11/2015   Procedure: Left Heart Cath and Coronary Angiography;  Surgeon: Corky Crafts, MD;  Location: [facility] INVASIVE CV LAB;  Service: Cardiovascular;  Laterality: N/A;   CARDIAC CATHETERIZATION  07/11/2015   Procedure: Coronary Stent Intervention;  Surgeon: Corky Crafts, MD;  Location: [facility] INVASIVE CV LAB;  Service: Cardiovascular;;   CARPAL TUNNEL RELEASE Bilateral 1998-2005?   right-left   CARPAL TUNNEL RELEASE Left 2009?   "for the 2nd time"   CHOLECYSTECTOMY N/A 10/01/2022   Procedure: LAPAROSCOPIC CHOLECYSTECTOMY;  Surgeon: Berna Bue, MD;  Location: [facility];  Service: General;  Laterality: N/A;   COLONOSCOPY     COLONOSCOPY WITH PROPOFOL N/A 05/28/2018   Procedure: COLONOSCOPY WITH PROPOFOL;  Surgeon: Rachael Fee, MD;  Location: [facility] ENDOSCOPY;  Service: Endoscopy;  Laterality: N/A;   CORONARY ANGIOPLASTY     CYSTOSCOPY W/ STONE MANIPULATION  10/22/2011   ESOPHAGOGASTRODUODENOSCOPY (EGD) WITH ESOPHAGEAL DILATION  10/21/2010   FOOT SURGERY Bilateral 06/22/1979   Callous removed    INNER EAR SURGERY Right ~ 2009   KNEE ARTHROPLASTY Right 10/21/1989   KNEE ARTHROSCOPY  ~ 2000   LATERAL RELEASE   LEFT HEART CATH AND CORONARY ANGIOGRAPHY N/A 12/18/2016   Procedure: Left Heart Cath and Coronary Angiography;  Surgeon: Kathleene Hazel, MD;  Location: [facility]VAS[facility] CV LAB;  Service: Cardiovascular;  Laterality: N/A;   LEFT HEART CATH AND CORONARY ANGIOGRAPHY N/A 02/01/2021   Procedure: LEFT HEART CATH AND CORONARY ANGIOGRAPHY;  Surgeon: Marykay Lex, MD;  Location: [facility] INVASIVE CV LAB;  Service: Cardiovascular;  Laterality: N/A;   LUNG CANCER SURGERY Left 10/22/1999   "small cell"   LUNG SURGERY  Right 10/21/2005   "reinflated it"   PULMONARY EMBOLISM SURGERY  10/21/2005   LUNG COLLAPSE   RIGHT AND LEFT HEART CATH N/A 06/25/2017   Procedure: RIGHT AND LEFT HEART CATH;  Surgeon: Tonny Bollman, MD;  Location: [facility] INVASIVE CV LAB;  Service: Cardiovascular;  Laterality: N/A;   SHOULDER ADHESION RELEASE Right ~ 2004   SHOULDER ARTHROSCOPY W/ ROTATOR CUFF REPAIR Right ~ 2003   UPPER GASTROINTESTINAL ENDOSCOPY  06/05/2021   VIDEO BRONCHOSCOPY N/A 08/03/2021   Procedure: VIDEO BRONCHOSCOPY WITHOUT FLUORO;  Surgeon: Oretha Milch, MD;  Location: [facility] ENDOSCOPY;  Service: Cardiopulmonary;  Laterality: N/A;    MEDICATIONS:  ACCU-CHEK GUIDE test strip   Accu-Chek Softclix Lancets lancets   acetaminophen (TYLENOL) 650 MG CR tablet   apixaban (ELIQUIS) 5 MG TABS tablet   B-D UF III MINI PEN NEEDLES 31G X 5 MM MISC   Blood Glucose Monitoring Suppl (ACCU-CHEK GUIDE) w/Device KIT   co-enzyme Q-10 50 MG capsule   diltiazem (CARDIZEM CD) 360 MG 24 hr capsule   furosemide (LASIX) 40 MG tablet   levothyroxine (SYNTHROID, LEVOTHROID) 50 MCG tablet   losartan (COZAAR) 25 MG tablet   metoprolol succinate (TOPROL-XL) 50 MG 24 hr tablet   MOUNJARO 2.5 MG/0.5ML Pen   nitroGLYCERIN (NITROSTAT) 0.4 MG SL tablet   NOVOLOG MIX 70/30 FLEXPEN (70-30) 100 UNIT/ML FlexPen   pantoprazole (PROTONIX) 40 MG tablet   pravastatin (PRAVACHOL) 20 MG tablet   rosuvastatin (CRESTOR) 20 MG tablet   spironolactone (ALDACTONE) 25 MG tablet   No current facility-administered medications for this encounter.   Jodell Cipro Ward, PA-C [facility] [phone]

## 2023-04-02 NOTE — Anesthesia Preprocedure Evaluation (Addendum)
Anesthesia Evaluation  Patient identified by MRN, date of birth, ID band Patient awake    Reviewed: Allergy & Precautions, NPO status , Patient's Chart, lab work & pertinent test results  History of Anesthesia Complications (+) PONV and history of anesthetic complications  Airway Mallampati: II  TM Distance: >3 FB Neck ROM: Full    Dental no notable dental hx. (+) Upper Dentures   Pulmonary sleep apnea , former smoker S/P LUL for lung CA 2001 and Rads pulmonary Fibrosis   Pulmonary exam normal breath sounds clear to auscultation       Cardiovascular hypertension, + angina  + CAD, + Cardiac Stents (DES 2016) and + Peripheral Vascular Disease  Normal cardiovascular exam+ dysrhythmias Atrial Fibrillation + Valvular Problems/Murmurs AI  Rhythm:Regular Rate:Normal     Neuro/Psych  negative psych ROS   GI/Hepatic negative GI ROS, Neg liver ROS,,,  Endo/Other  diabetes (on Mojaro last dose), Type 2Hypothyroidism    Renal/GU Lab Results      Component                Value               Date                      CREATININE               0.85                04/01/2023                BUN                      25 (H)              04/01/2023                NA                       137                 04/01/2023                K                        4.2                 04/01/2023                    Musculoskeletal  (+) Arthritis ,    Abdominal  (+) + obese  Peds  Hematology Afib on Eliquis  Lab Results      Component                Value               Date                      WBC                      5.7                 04/01/2023                HGB                      11.7 (  L)            04/01/2023                HCT                      36.7                04/01/2023                   PLT                      280                 04/01/2023              Anesthesia Other Findings ZOX:WRUEAVW, invokana, meformin,  nydrocodone, morphine. Codeine, lisinopril, NSaids  Reproductive/Obstetrics                             Anesthesia Physical Anesthesia Plan  ASA: 3  Anesthesia Plan: Spinal and Regional   Post-op Pain Management: Regional block* and Minimal or no pain anticipated   Induction: Intravenous  PONV Risk Score and Plan: 2 and Treatment may vary due to age or medical condition and Midazolam  Airway Management Planned: Natural Airway and Nasal Cannula  Additional Equipment: None  Intra-op Plan:   Post-operative Plan:   Informed Consent: I have reviewed the patients History and Physical, chart, labs and discussed the procedure including the risks, benefits and alternatives for the proposed anesthesia with the patient or authorized representative who has indicated his/her understanding and acceptance.     Dental advisory given  Plan Discussed with: CRNA  Anesthesia Plan Comments: (See PAT note 04/01/23 Spinal w L adductor canal)       Anesthesia Quick Evaluation

## 2023-04-13 NOTE — H&P (Signed)
TOTAL KNEE ADMISSION H&P  Patient is being admitted for left total knee arthroplasty.  Subjective:  Chief Complaint:left knee pain.  HPI: [name], 67 y.o. female, has a history of pain and functional disability in the left knee due to arthritis and has failed non-surgical conservative treatments for greater than 12 weeks to includeNSAID's and/or analgesics, corticosteriod injections, viscosupplementation injections, flexibility and strengthening excercises, and activity modification.  Onset of symptoms was gradual, starting 5 years ago with gradually worsening course since that time. The patient noted no past surgery on the left knee(s).  Patient currently rates pain in the left knee(s) at 9 out of 10 with activity. Patient has night pain, worsening of pain with activity and weight bearing, pain that interferes with activities of daily living, pain with passive range of motion, and joint swelling.  Patient has evidence of subchondral cysts, subchondral sclerosis, periarticular osteophytes, and joint subluxation by imaging studies. This patient has had  Failure of all reasonable conservative care . There is no active infection.  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   Type 2 diabetes mellitus with stage 3 chronic kidney disease, with long-term current use of insulin (HCC) 07/17/2020   PVD (peripheral vascular disease) (HCC) 07/06/2020   Daytime sleepiness 02/14/2020   Paroxysmal atrial fibrillation (HCC) 01/25/2020   Hypercoagulable state due to paroxysmal atrial fibrillation (HCC) 01/25/2020   Hypothyroidism 02/17/2019   Cellulitis of fourth toe, left  01/01/2019   Diverticulosis of colon without hemorrhage    Chronic diastolic (congestive) heart failure (HCC) 08/04/2017   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   Chronic pericarditis 06/13/2017   Unstable angina (HCC) 04/12/2017   Atrial fibrillation with RVR (HCC)    Hyperlipidemia 08/07/2015   CAD S/P percutaneous coronary angioplasty 07/25/2015   Hypertension, essential    Aortic insufficiency    Stented coronary artery    Abnormal nuclear stress test 07/11/2015   Abnormal stress test 07/04/2015   Pericarditis    Controlled type 2 diabetes mellitus without complication, with long-term current use of insulin (HCC)    Pericardial effusion    Chest pain 05/30/2015   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 Mod by echo 05/2015.   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 Abnormal stress test -> LHc 06/2015 s/p DES to mCX and mLAD, residual D2  disease treated medially.   Diverticulitis    Erythema nodosum    Heart murmur    History of kidney stones    Hypertension    Insomnia    lung ca dx'd 10/1999   chemo/xrt comp 05/29/2000   Myalgia    Obesity    Pericardial effusion    a. 05/2015 Echo: EF 55-60%,  no rwma, Gr 1 DD, no effusion - but an effusion was seen on CT which was felt to be increased in size compared to 12/2014 - pericardium also thickened.    Pericarditis    a. Dx 05/2015.   Pneumothorax, spontaneous, tension 01/20/2012   October, 2010   PONV (postoperative nausea and vomiting)    Pulmonary fibrosis (HCC) 01/20/2012   Due to previous surgery and chest radiation for lung cancer   Type II diabetes mellitus [facility])     Past Surgical History:  Procedure Laterality Date   ABDOMINAL HYSTERECTOMY  10/21/2006   APPENDECTOMY  1969?   ATRIAL FIBRILLATION ABLATION N/A 02/27/2022   Procedure: ATRIAL FIBRILLATION ABLATION;  Surgeon: Regan Lemming, MD;  Location: [facility] INVASIVE CV LAB;  Service: Cardiovascular;  Laterality: N/A;   BRONCHIAL WASHINGS  08/03/2021   Procedure: BRONCHIAL WASHINGS;  Surgeon: Oretha Milch, MD;  Location: [facility] ENDOSCOPY;  Service: Cardiopulmonary;;   CARDIAC CATHETERIZATION N/A 07/11/2015   Procedure: Left Heart Cath and Coronary Angiography;  Surgeon: Corky Crafts, MD;  Location: [facility] INVASIVE CV LAB;  Service: Cardiovascular;  Laterality: N/A;   CARDIAC CATHETERIZATION  07/11/2015   Procedure: Coronary Stent Intervention;  Surgeon: Corky Crafts, MD;  Location: [facility] INVASIVE CV LAB;  Service: Cardiovascular;;   CARPAL TUNNEL RELEASE Bilateral 1998-2005?   right-left   CARPAL TUNNEL RELEASE Left 2009?   "for the 2nd time"   CHOLECYSTECTOMY N/A 10/01/2022   Procedure: LAPAROSCOPIC CHOLECYSTECTOMY;  Surgeon: Berna Bue, MD;  Location: [facility] ORS;  Service: General;  Laterality: N/A;   COLONOSCOPY     COLONOSCOPY WITH PROPOFOL N/A 05/28/2018   Procedure: COLONOSCOPY WITH PROPOFOL;  Surgeon: Rachael Fee, MD;  Location: [facility] ENDOSCOPY;  Service: Endoscopy;  Laterality: N/A;   CORONARY ANGIOPLASTY     CYSTOSCOPY W/ STONE MANIPULATION  10/22/2011   ESOPHAGOGASTRODUODENOSCOPY (EGD) WITH ESOPHAGEAL DILATION  10/21/2010   FOOT SURGERY Bilateral  06/22/1979   Callous removed    INNER EAR SURGERY Right ~ 2009   KNEE ARTHROPLASTY Right 10/21/1989   KNEE ARTHROSCOPY  ~ 2000   LATERAL RELEASE   LEFT HEART CATH AND CORONARY ANGIOGRAPHY N/A 12/18/2016   Procedure: Left Heart Cath and Coronary Angiography;  Surgeon: Kathleene Hazel, MD;  Location[facility] INVASIVE CV LAB;  Service: Cardiovascular;  Laterality: N/A;   LEFT HEART CATH AND CORONARY ANGIOGRAPHY N/A 02/01/2021   Procedure: LEFT HEART CATH AND CORONARY ANGIOGRAPHY;  Surgeon: Marykay Lex, MD;  Location: [facility] INVASIVE CV LAB;  Service: Cardiovascular;  Laterality: N/A;   LUNG CANCER SURGERY Left 10/22/1999   "small cell"   LUNG SURGERY Right 10/21/2005   "reinflated it"   PULMONARY EMBOLISM SURGERY  10/21/2005   LUNG COLLAPSE   RIGHT AND LEFT HEART CATH N/A 06/25/2017   Procedure: RIGHT AND LEFT HEART CATH;  Surgeon: Tonny Bollman, MD;  Location: [facility] INVASIVE CV LAB;  Service: Cardiovascular;  Laterality: N/A;   SHOULDER ADHESION RELEASE Right ~ 2004   SHOULDER ARTHROSCOPY W/ ROTATOR CUFF REPAIR Right ~ 2003   UPPER GASTROINTESTINAL ENDOSCOPY  06/05/2021   VIDEO BRONCHOSCOPY N/A 08/03/2021  Procedure: VIDEO BRONCHOSCOPY WITHOUT FLUORO;  Surgeon: Oretha Milch, MD;  Location: [facility] ENDOSCOPY;  Service: Cardiopulmonary;  Laterality: N/A;    No current facility-administered medications for this encounter.   Current Outpatient Medications  Medication Sig Dispense Refill Last Dose   acetaminophen (TYLENOL) 650 MG CR tablet Take 1,300 mg by mouth every 8 (eight) hours as needed for pain (headache).      apixaban (ELIQUIS) 5 MG TABS tablet TAKE 1 TABLET BY MOUTH TWICE A DAY 180 tablet 1    co-enzyme Q-10 50 MG capsule TAKE 1 CAPSULE BY MOUTH EVERY DAY 30 capsule 7    diltiazem (CARDIZEM CD) 360 MG 24 hr capsule TAKE 1 CAPSULE BY MOUTH EVERY DAY 90 capsule 3    furosemide (LASIX) 40 MG tablet Take 1 tablet (40 mg total) by mouth daily as needed for fluid or edema. 30 tablet 6     levothyroxine (SYNTHROID, LEVOTHROID) 50 MCG tablet Take 50 mcg by mouth daily before breakfast.   6    losartan (COZAAR) 25 MG tablet TAKE 1 TABLET BY MOUTH EVERY DAY 90 tablet 3    metoprolol succinate (TOPROL-XL) 50 MG 24 hr tablet TAKE 1.5 TABLETS (75 MG TOTAL) BY MOUTH AT BEDTIME. TAKE WITH OR IMMEDIATELY FOLLOWING A MEAL. 135 tablet 2    MOUNJARO 2.5 MG/0.5ML Pen Inject 2.5 mg into the skin once a week.      nitroGLYCERIN (NITROSTAT) 0.4 MG SL tablet Place 0.4 mg under the tongue every 5 (five) minutes as needed for chest pain.      NOVOLOG MIX 70/30 FLEXPEN (70-30) 100 UNIT/ML FlexPen Inject 20 Units into the skin 2 (two) times daily with a meal.      rosuvastatin (CRESTOR) 20 MG tablet Take 20 mg by mouth daily.      spironolactone (ALDACTONE) 25 MG tablet TAKE 1 TABLET (25 MG TOTAL) BY MOUTH DAILY. 90 tablet 3    ACCU-CHEK GUIDE test strip       Accu-Chek Softclix Lancets lancets USE LANCETS 3 TIMES A DAY AS DIRECTED 30 DAYS      B-D UF III MINI PEN NEEDLES 31G X 5 MM MISC 2 (two) times daily. as directed      Blood Glucose Monitoring Suppl (ACCU-CHEK GUIDE) w/Device KIT See admin instructions.      pantoprazole (PROTONIX) 40 MG tablet Take 1 tablet (40 mg total) by mouth daily before breakfast. (Patient not taking: Reported on 03/31/2023) 30 tablet 4 Not Taking   pravastatin (PRAVACHOL) 20 MG tablet TAKE 1 TABLET BY MOUTH EVERY DAY (Patient not taking: Reported on 03/31/2023) 90 tablet 3 Not Taking   Allergies  Allergen Reactions   Hydrocodone Hives and Itching   Morphine And Codeine Other (See Comments)    GI PROBLEMS   Crestor [Rosuvastatin] Other (See Comments)    Pt reports causes lower extremity muscle aches/cramping   Invokana [Canagliflozin] Diarrhea   Metformin Hcl Diarrhea   Lisinopril Cough   Nsaids Other (See Comments)    GI PROBLEMS    Social History   Tobacco Use   Smoking status: Former    Packs/day: 0.25    Years: 5.00    Additional pack years: 0.00     Total pack years: 1.25    Types: Cigarettes    Quit date: 07/22/1999    Years since quitting: 23.7   Smokeless tobacco: Never   Tobacco comments:    Former smoker 11/28/22  Substance Use Topics   Alcohol use: No  Alcohol/week: 0.0 standard drinks of alcohol    Family History  Problem Relation Age of Onset   Heart disease Father    Diabetes Father    Pancreatic cancer Mother    Diabetes Mother    Hypertension Sister        x 3   Diabetes Sister        x 4   Diabetes Maternal Grandmother    Diabetes Paternal Grandmother    Diabetes Sister    Diabetes Sister    Diabetes Sister    Kidney disease Sister    Colon cancer Neg Hx    Colon polyps Neg Hx    Rectal cancer Neg Hx    Stomach cancer Neg Hx    Esophageal cancer Neg Hx    Breast cancer Neg Hx      Review of Systems ROS: I have reviewed the patient's review of systems thoroughly and there are no positive responses as relates to the HPI.  Objective:  Physical Exam  Vital signs in last 24 hours:   Well-developed well-nourished patient in no acute distress. Alert and oriented x3 HEENT:within normal limits Cardiac: Regular rate and rhythm Pulmonary: Lungs clear to auscultation Abdomen: Soft and nontender.  Normal active bowel sounds  Musculoskeletal: (Left knee: Painful range of motion.  Limited range of motion.  No instability.  Trace effusion.  No erythema or warmth.  Neurovascular intact distally.)  Labs: Recent Results (from the past 2160 hour(s))  ECHOCARDIOGRAM COMPLETE     Status: None   Collection Time: 02/05/23 10:30 AM  Result Value Ref Range   Area-P 1/2 5.27 cm2   S' Lateral 3.30 cm   P 1/2 time 338 msec   Est EF 55 - 60%   Glucose, capillary     Status: None   Collection Time: 04/01/23 10:54 AM  Result Value Ref Range   Glucose-Capillary 73 70 - 99 mg/dL    Comment: Glucose reference range applies only to samples taken after fasting for at least 8 hours.  Hemoglobin A1c per protocol      Status: Abnormal   Collection Time: 04/01/23 11:03 AM  Result Value Ref Range   Hgb A1c MFr Bld 5.9 (H) 4.8 - 5.6 %    Comment: (NOTE) Pre diabetes:          5.7%-6.4%  Diabetes:              >6.4%  Glycemic control for   <7.0% adults with diabetes    Mean Plasma Glucose 122.63 mg/dL    Comment: Performed at [facility], [address], [place], [place] [address]  Basic metabolic panel per protocol     Status: Abnormal   Collection Time: 04/01/23 11:03 AM  Result Value Ref Range   Sodium 137 135 - 145 mmol/L   Potassium 4.2 3.5 - 5.1 mmol/L   Chloride 103 98 - 111 mmol/L   CO2 26 22 - 32 mmol/L   Glucose, Bld 83 70 - 99 mg/dL    Comment: Glucose reference range applies only to samples taken after fasting for at least 8 hours.   BUN 25 (H) 8 - 23 mg/dL   Creatinine, Ser 1.30 0.44 - 1.00 mg/dL   Calcium 8.7 (L) 8.9 - 10.3 mg/dL   GFR, Estimated >86 >57 mL/min    Comment: (NOTE) Calculated using the CKD-EPI Creatinine Equation (2021)    Anion gap 8 5 - 15    Comment: Performed at [facility], [address], [place], [place] [address]  CBC per protocol     Status: Abnormal   Collection Time: 04/01/23 11:03 AM  Result Value Ref Range   WBC 5.7 4.0 - 10.5 K/uL   RBC 3.77 (L) 3.87 - 5.11 MIL/uL   Hemoglobin 11.7 (L) 12.0 - 15.0 g/dL   HCT 82.9 56.2 - 13.0 %   MCV 97.3 80.0 - 100.0 fL   MCH 31.0 26.0 - 34.0 pg   MCHC 31.9 30.0 - 36.0 g/dL   RDW 86.5 78.4 - 69.6 %   Platelets 280 150 - 400 K/uL   nRBC 0.0 0.0 - 0.2 %    Comment: Performed at [facility], [address] [place], [place] [address]  Surgical pcr screen     Status: None   Collection Time: 04/01/23 11:03 AM   Specimen: Nasal Mucosa; Nasal Swab  Result Value Ref Range   MRSA, PCR NEGATIVE NEGATIVE   Staphylococcus aureus NEGATIVE NEGATIVE    Comment: (NOTE) The Xpert SA Assay (FDA approved for NASAL specimens in patients 61 years of age and older), is one  component of a comprehensive surveillance program. It is not intended to diagnose infection nor to guide or monitor treatment. Performed at [facility], [address] [place], [place] [address]   Glucose, capillary     Status: Abnormal   Collection Time: 04/01/23 11:17 AM  Result Value Ref Range   Glucose-Capillary 61 (L) 70 - 99 mg/dL    Comment: Glucose reference range applies only to samples taken after fasting for at least 8 hours.  Glucose, capillary     Status: Abnormal   Collection Time: 04/01/23 11:33 AM  Result Value Ref Range   Glucose-Capillary 103 (H) 70 - 99 mg/dL    Comment: Glucose reference range applies only to samples taken after fasting for at least 8 hours.     Estimated body mass index is 33.99 kg/m as calculated from the following:   Height as of 04/01/23: 5\' 7"  (1.702 m).   Weight as of 04/01/23: 98.4 kg.   Imaging Review Plain radiographs demonstrate severe degenerative joint disease of the left knee(s). The overall alignment ismild varus. The bone quality appears to be fair for age and reported activity level.      Assessment/Plan:  End stage arthritis, left knee   The patient history, physical examination, clinical judgment of the provider and imaging studies are consistent with end stage degenerative joint disease of the left knee(s) and total knee arthroplasty is deemed medically necessary. The treatment options including medical management, injection therapy arthroscopy and arthroplasty were discussed at length. The risks and benefits of total knee arthroplasty were presented and reviewed. The risks due to aseptic loosening, infection, stiffness, patella tracking problems, thromboembolic complications and other imponderables were discussed. The patient acknowledged the explanation, agreed to proceed with the plan and consent was signed. Patient is being admitted for inpatient treatment for surgery, pain control, PT, OT, prophylactic  antibiotics, VTE prophylaxis, progressive ambulation and ADL's and discharge planning. The patient is planning to be discharged home with home health services     Patient's anticipated LOS is less than 2 midnights, meeting these requirements: - Younger than 82 - Lives within 1 hour of care - Has a competent adult at home to recover with post-op recover - NO history of  - Chronic pain requiring opiods  - Diabetes  - Coronary Artery Disease  - Heart failure  - Heart  attack  - Stroke  - DVT/VTE  - Cardiac arrhythmia  - Respiratory Failure/COPD  - Renal failure  - Anemia  - Advanced Liver disease

## 2023-04-14 ENCOUNTER — Ambulatory Visit (HOSPITAL_BASED_OUTPATIENT_CLINIC_OR_DEPARTMENT_OTHER): Payer: Medicare Other | Admitting: Anesthesiology

## 2023-04-14 ENCOUNTER — Ambulatory Visit (HOSPITAL_COMMUNITY): Payer: Medicare Other | Admitting: Physician Assistant

## 2023-04-14 ENCOUNTER — Encounter (HOSPITAL_COMMUNITY)
Admission: RE | Disposition: A | Discharge status: Home / Self Care | Payer: Self-pay | Source: Ambulatory Visit | Attending: Orthopedic Surgery

## 2023-04-14 ENCOUNTER — Other Ambulatory Visit: Payer: Self-pay

## 2023-04-14 ENCOUNTER — Encounter (HOSPITAL_COMMUNITY): Payer: Self-pay | Admitting: Orthopedic Surgery

## 2023-04-14 ENCOUNTER — Observation Stay (HOSPITAL_COMMUNITY)
Admission: RE | Admit: 2023-04-14 | Discharge: 2023-04-17 | Disposition: A | Discharge status: Home / Self Care | Payer: Medicare Other | Source: Ambulatory Visit | Attending: Orthopedic Surgery | Admitting: Orthopedic Surgery

## 2023-04-14 DIAGNOSIS — I10 Essential (primary) hypertension: Secondary | ICD-10-CM | POA: Diagnosis not present

## 2023-04-14 DIAGNOSIS — Z87891 Personal history of nicotine dependence: Secondary | ICD-10-CM

## 2023-04-14 DIAGNOSIS — I2510 Atherosclerotic heart disease of native coronary artery without angina pectoris: Secondary | ICD-10-CM | POA: Diagnosis not present

## 2023-04-14 DIAGNOSIS — Z01818 Encounter for other preprocedural examination: Secondary | ICD-10-CM

## 2023-04-14 DIAGNOSIS — Z794 Long term (current) use of insulin: Secondary | ICD-10-CM

## 2023-04-14 DIAGNOSIS — Z7901 Long term (current) use of anticoagulants: Secondary | ICD-10-CM | POA: Insufficient documentation

## 2023-04-14 DIAGNOSIS — I1310 Hypertensive heart and chronic kidney disease without heart failure, with stage 1 through stage 4 chronic kidney disease, or unspecified chronic kidney disease: Secondary | ICD-10-CM | POA: Insufficient documentation

## 2023-04-14 DIAGNOSIS — M1712 Unilateral primary osteoarthritis, left knee: Principal | ICD-10-CM | POA: Insufficient documentation

## 2023-04-14 DIAGNOSIS — E1122 Type 2 diabetes mellitus with diabetic chronic kidney disease: Secondary | ICD-10-CM | POA: Diagnosis not present

## 2023-04-14 DIAGNOSIS — N183 Chronic kidney disease, stage 3 unspecified: Secondary | ICD-10-CM | POA: Insufficient documentation

## 2023-04-14 DIAGNOSIS — Z85118 Personal history of other malignant neoplasm of bronchus and lung: Secondary | ICD-10-CM | POA: Insufficient documentation

## 2023-04-14 DIAGNOSIS — I480 Paroxysmal atrial fibrillation: Secondary | ICD-10-CM | POA: Diagnosis not present

## 2023-04-14 DIAGNOSIS — Z79899 Other long term (current) drug therapy: Secondary | ICD-10-CM | POA: Insufficient documentation

## 2023-04-14 DIAGNOSIS — Z96652 Presence of left artificial knee joint: Secondary | ICD-10-CM

## 2023-04-14 HISTORY — PX: TOTAL KNEE ARTHROPLASTY: SHX125

## 2023-04-14 LAB — GLUCOSE, CAPILLARY
Glucose-Capillary: 92 mg/dL (ref 70–99)
Glucose-Capillary: 134 mg/dL — ABNORMAL HIGH (ref 70–99)

## 2023-04-14 SURGERY — ARTHROPLASTY, KNEE, TOTAL
Anesthesia: Regional | Site: Knee | Laterality: Left

## 2023-04-14 MED ORDER — OXYCODONE HCL 5 MG PO TABS
5.0000 mg | ORAL_TABLET | ORAL | Status: DC | PRN
  Administered 2023-04-16 (×2): 10 mg via ORAL
  Filled 2023-04-14 (×7): qty 2

## 2023-04-14 MED ORDER — OXYCODONE HCL 5 MG/5ML PO SOLN: 5.0000 mg | Freq: Once | ORAL | Status: DC | PRN

## 2023-04-14 MED ORDER — SODIUM CHLORIDE (PF) 0.9 % IJ SOLN
INTRAMUSCULAR | Status: AC
  Filled 2023-04-14: qty 50

## 2023-04-14 MED ORDER — BUPIVACAINE LIPOSOME 1.3 % IJ SUSP
INTRAMUSCULAR | Status: AC
  Filled 2023-04-14: qty 20

## 2023-04-14 MED ORDER — TRANEXAMIC ACID-NACL 1000-0.7 MG/100ML-% IV SOLN
1000.0000 mg | INTRAVENOUS | Status: AC
  Administered 2023-04-14: 1000 mg via INTRAVENOUS
  Filled 2023-04-14: qty 100

## 2023-04-14 MED ORDER — ONDANSETRON HCL 4 MG/2ML IJ SOLN
INTRAMUSCULAR | Status: DC | PRN
  Administered 2023-04-14: 4 mg via INTRAVENOUS

## 2023-04-14 MED ORDER — CEFAZOLIN SODIUM-DEXTROSE 2-4 GM/100ML-% IV SOLN
2.0000 g | Freq: Four times a day (QID) | INTRAVENOUS | Status: AC
  Administered 2023-04-14 (×2): 2 g via INTRAVENOUS
  Filled 2023-04-14 (×2): qty 100

## 2023-04-14 MED ORDER — APIXABAN 5 MG PO TABS: 5.0000 mg | ORAL_TABLET | Freq: Two times a day (BID) | ORAL | Status: DC

## 2023-04-14 MED ORDER — MIDAZOLAM HCL 5 MG/5ML IJ SOLN
INTRAMUSCULAR | Status: DC | PRN
  Administered 2023-04-14: 2 mg via INTRAVENOUS

## 2023-04-14 MED ORDER — METHOCARBAMOL 500 MG PO TABS
500.0000 mg | ORAL_TABLET | Freq: Four times a day (QID) | ORAL | Status: DC | PRN
  Administered 2023-04-14 – 2023-04-17 (×9): 500 mg via ORAL
  Filled 2023-04-14 (×9): qty 1

## 2023-04-14 MED ORDER — BISACODYL 5 MG PO TBEC: 5.0000 mg | DELAYED_RELEASE_TABLET | Freq: Every day | ORAL | Status: DC | PRN

## 2023-04-14 MED ORDER — HYDROMORPHONE HCL 1 MG/ML IJ SOLN
INTRAMUSCULAR | Status: AC
  Filled 2023-04-14: qty 1

## 2023-04-14 MED ORDER — ONDANSETRON HCL 4 MG/2ML IJ SOLN
INTRAMUSCULAR | Status: AC
  Filled 2023-04-14: qty 2

## 2023-04-14 MED ORDER — METHOCARBAMOL 500 MG IVPB - SIMPLE MED: 500.0000 mg | Freq: Four times a day (QID) | INTRAVENOUS | Status: DC | PRN

## 2023-04-14 MED ORDER — ACETAMINOPHEN 500 MG PO TABS
1000.0000 mg | ORAL_TABLET | Freq: Four times a day (QID) | ORAL | Status: AC
  Administered 2023-04-14 – 2023-04-15 (×3): 1000 mg via ORAL
  Filled 2023-04-14 (×3): qty 2

## 2023-04-14 MED ORDER — PROPOFOL 10 MG/ML IV BOLUS
INTRAVENOUS | Status: DC | PRN
  Administered 2023-04-14: 20 mg via INTRAVENOUS

## 2023-04-14 MED ORDER — ACETAMINOPHEN 10 MG/ML IV SOLN
1000.0000 mg | Freq: Once | INTRAVENOUS | Status: DC | PRN
  Administered 2023-04-14: 1000 mg via INTRAVENOUS

## 2023-04-14 MED ORDER — DOCUSATE SODIUM 100 MG PO CAPS
100.0000 mg | ORAL_CAPSULE | Freq: Two times a day (BID) | ORAL | Status: DC
  Administered 2023-04-14 – 2023-04-17 (×6): 100 mg via ORAL
  Filled 2023-04-14 (×6): qty 1

## 2023-04-14 MED ORDER — MAGNESIUM CITRATE PO SOLN: 1.0000 | Freq: Once | ORAL | Status: DC | PRN

## 2023-04-14 MED ORDER — METOCLOPRAMIDE HCL 5 MG PO TABS: 5.0000 mg | ORAL_TABLET | Freq: Three times a day (TID) | ORAL | Status: DC | PRN

## 2023-04-14 MED ORDER — ORAL CARE MOUTH RINSE: 15.0000 mL | Freq: Once | OROMUCOSAL | Status: AC

## 2023-04-14 MED ORDER — BUPIVACAINE LIPOSOME 1.3 % IJ SUSP
INTRAMUSCULAR | Status: DC | PRN
  Administered 2023-04-14: 20 mL

## 2023-04-14 MED ORDER — WATER FOR IRRIGATION, STERILE IR SOLN
Status: DC | PRN
  Administered 2023-04-14: 2000 mL

## 2023-04-14 MED ORDER — HYDROMORPHONE HCL 1 MG/ML IJ SOLN
0.2500 mg | INTRAMUSCULAR | Status: DC | PRN
  Administered 2023-04-14 (×2): 0.5 mg via INTRAVENOUS

## 2023-04-14 MED ORDER — POVIDONE-IODINE 10 % EX SWAB: 2.0000 | Freq: Once | CUTANEOUS | Status: DC

## 2023-04-14 MED ORDER — ONDANSETRON HCL 4 MG/2ML IJ SOLN
4.0000 mg | Freq: Four times a day (QID) | INTRAMUSCULAR | Status: DC | PRN
  Administered 2023-04-14 (×2): 4 mg via INTRAVENOUS
  Filled 2023-04-14 (×2): qty 2

## 2023-04-14 MED ORDER — DEXAMETHASONE SODIUM PHOSPHATE 10 MG/ML IJ SOLN
10.0000 mg | Freq: Two times a day (BID) | INTRAMUSCULAR | Status: AC
  Administered 2023-04-15 – 2023-04-16 (×3): 10 mg via INTRAVENOUS
  Filled 2023-04-14 (×3): qty 1

## 2023-04-14 MED ORDER — ACETAMINOPHEN 325 MG PO TABS: 325.0000 mg | ORAL_TABLET | Freq: Four times a day (QID) | ORAL | Status: DC | PRN

## 2023-04-14 MED ORDER — BUPIVACAINE IN DEXTROSE 0.75-8.25 % IT SOLN
INTRATHECAL | Status: DC | PRN
  Administered 2023-04-14: 13.5 mg via INTRATHECAL

## 2023-04-14 MED ORDER — METOCLOPRAMIDE HCL 5 MG/ML IJ SOLN: 5.0000 mg | Freq: Three times a day (TID) | INTRAMUSCULAR | Status: DC | PRN

## 2023-04-14 MED ORDER — PHENOL 1.4 % MT LIQD: 1.0000 | OROMUCOSAL | Status: DC | PRN

## 2023-04-14 MED ORDER — PROPOFOL 500 MG/50ML IV EMUL
INTRAVENOUS | Status: DC | PRN
  Administered 2023-04-14: 50 ug/kg/min via INTRAVENOUS

## 2023-04-14 MED ORDER — SODIUM CHLORIDE 0.9 % IR SOLN
Status: DC | PRN
  Administered 2023-04-14: 1000 mL

## 2023-04-14 MED ORDER — FENTANYL CITRATE (PF) 100 MCG/2ML IJ SOLN
INTRAMUSCULAR | Status: AC
  Filled 2023-04-14: qty 2

## 2023-04-14 MED ORDER — PROPOFOL 10 MG/ML IV BOLUS
INTRAVENOUS | Status: AC
  Filled 2023-04-14: qty 20

## 2023-04-14 MED ORDER — LIDOCAINE HCL (PF) 2 % IJ SOLN
INTRAMUSCULAR | Status: AC
  Filled 2023-04-14: qty 5

## 2023-04-14 MED ORDER — BUPIVACAINE-EPINEPHRINE 0.5% -1:200000 IJ SOLN
INTRAMUSCULAR | Status: DC | PRN
  Administered 2023-04-14: 30 mL

## 2023-04-14 MED ORDER — OXYCODONE HCL 5 MG PO TABS
10.0000 mg | ORAL_TABLET | ORAL | Status: DC | PRN
  Administered 2023-04-14 (×2): 10 mg via ORAL
  Administered 2023-04-15 (×2): 15 mg via ORAL
  Administered 2023-04-15 – 2023-04-16 (×3): 10 mg via ORAL
  Administered 2023-04-16: 15 mg via ORAL
  Administered 2023-04-17 (×2): 10 mg via ORAL
  Filled 2023-04-14 (×2): qty 3
  Filled 2023-04-14 (×2): qty 2
  Filled 2023-04-14: qty 3

## 2023-04-14 MED ORDER — DIPHENHYDRAMINE HCL 12.5 MG/5ML PO ELIX: 12.5000 mg | ORAL_SOLUTION | ORAL | Status: DC | PRN

## 2023-04-14 MED ORDER — SODIUM CHLORIDE 0.9% FLUSH
INTRAVENOUS | Status: DC | PRN
  Administered 2023-04-14: 50 mL

## 2023-04-14 MED ORDER — PROPOFOL 1000 MG/100ML IV EMUL
INTRAVENOUS | Status: AC
  Filled 2023-04-14: qty 100

## 2023-04-14 MED ORDER — ACETAMINOPHEN 10 MG/ML IV SOLN
INTRAVENOUS | Status: AC
  Filled 2023-04-14: qty 100

## 2023-04-14 MED ORDER — LACTATED RINGERS IV SOLN: INTRAVENOUS | Status: DC

## 2023-04-14 MED ORDER — ROPIVACAINE HCL 5 MG/ML IJ SOLN
INTRAMUSCULAR | Status: DC | PRN
  Administered 2023-04-14: 30 mL via PERINEURAL

## 2023-04-14 MED ORDER — EPINEPHRINE PF 1 MG/ML IJ SOLN
INTRAMUSCULAR | Status: AC
  Filled 2023-04-14: qty 1

## 2023-04-14 MED ORDER — FENTANYL CITRATE (PF) 100 MCG/2ML IJ SOLN
INTRAMUSCULAR | Status: DC | PRN
  Administered 2023-04-14 (×2): 50 ug via INTRAVENOUS

## 2023-04-14 MED ORDER — DEXAMETHASONE SODIUM PHOSPHATE 10 MG/ML IJ SOLN
INTRAMUSCULAR | Status: AC
  Filled 2023-04-14: qty 1

## 2023-04-14 MED ORDER — INSULIN ASPART 100 UNIT/ML IJ SOLN: 0.0000 [IU] | INTRAMUSCULAR | Status: DC | PRN

## 2023-04-14 MED ORDER — PANTOPRAZOLE SODIUM 40 MG PO TBEC
40.0000 mg | DELAYED_RELEASE_TABLET | Freq: Every day | ORAL | Status: DC
  Administered 2023-04-15 – 2023-04-17 (×3): 40 mg via ORAL
  Filled 2023-04-14 (×4): qty 1

## 2023-04-14 MED ORDER — TRANEXAMIC ACID-NACL 1000-0.7 MG/100ML-% IV SOLN: 1000.0000 mg | Freq: Once | INTRAVENOUS | Status: DC

## 2023-04-14 MED ORDER — OXYCODONE HCL 5 MG PO TABS: 5.0000 mg | ORAL_TABLET | Freq: Once | ORAL | Status: DC | PRN

## 2023-04-14 MED ORDER — 0.9 % SODIUM CHLORIDE (POUR BTL) OPTIME
TOPICAL | Status: DC | PRN
  Administered 2023-04-14: 1000 mL

## 2023-04-14 MED ORDER — AMISULPRIDE (ANTIEMETIC) 5 MG/2ML IV SOLN: 10.0000 mg | Freq: Once | INTRAVENOUS | Status: DC | PRN

## 2023-04-14 MED ORDER — MIDAZOLAM HCL 2 MG/2ML IJ SOLN
INTRAMUSCULAR | Status: AC
  Filled 2023-04-14: qty 2

## 2023-04-14 MED ORDER — SODIUM CHLORIDE 0.9 % IV SOLN: INTRAVENOUS | Status: DC

## 2023-04-14 MED ORDER — HYDROMORPHONE HCL 1 MG/ML IJ SOLN
0.5000 mg | INTRAMUSCULAR | Status: DC | PRN
  Administered 2023-04-14 (×2): 1 mg via INTRAVENOUS
  Filled 2023-04-14 (×2): qty 1

## 2023-04-14 MED ORDER — POLYETHYLENE GLYCOL 3350 17 G PO PACK: 17.0000 g | PACK | Freq: Every day | ORAL | Status: DC | PRN

## 2023-04-14 MED ORDER — ONDANSETRON HCL 4 MG PO TABS
4.0000 mg | ORAL_TABLET | Freq: Four times a day (QID) | ORAL | Status: DC | PRN
  Administered 2023-04-15 – 2023-04-16 (×2): 4 mg via ORAL
  Filled 2023-04-14 (×2): qty 1

## 2023-04-14 MED ORDER — BUPIVACAINE HCL (PF) 0.5 % IJ SOLN
INTRAMUSCULAR | Status: AC
  Filled 2023-04-14: qty 30

## 2023-04-14 MED ORDER — ONDANSETRON HCL 4 MG/2ML IJ SOLN: 4.0000 mg | Freq: Once | INTRAMUSCULAR | Status: DC | PRN

## 2023-04-14 MED ORDER — ALUM & MAG HYDROXIDE-SIMETH 200-200-20 MG/5ML PO SUSP
30.0000 mL | ORAL | Status: DC | PRN
  Administered 2023-04-14: 30 mL via ORAL
  Filled 2023-04-14: qty 30

## 2023-04-14 MED ORDER — CLONIDINE HCL (ANALGESIA) 100 MCG/ML EP SOLN
EPIDURAL | Status: DC | PRN
  Administered 2023-04-14: 100 ug

## 2023-04-14 MED ORDER — DEXAMETHASONE SODIUM PHOSPHATE 10 MG/ML IJ SOLN
INTRAMUSCULAR | Status: DC | PRN
  Administered 2023-04-14: 10 mg via INTRAVENOUS

## 2023-04-14 MED ORDER — PHENYLEPHRINE HCL-NACL 20-0.9 MG/250ML-% IV SOLN
INTRAVENOUS | Status: DC | PRN
  Administered 2023-04-14: 50 ug/min via INTRAVENOUS

## 2023-04-14 MED ORDER — MENTHOL 3 MG MT LOZG: 1.0000 | LOZENGE | OROMUCOSAL | Status: DC | PRN

## 2023-04-14 MED ORDER — BUPIVACAINE LIPOSOME 1.3 % IJ SUSP: 20.0000 mL | Freq: Once | INTRAMUSCULAR | Status: DC

## 2023-04-14 MED ORDER — APIXABAN 5 MG PO TABS
5.0000 mg | ORAL_TABLET | Freq: Two times a day (BID) | ORAL | Status: DC
  Administered 2023-04-15 – 2023-04-17 (×5): 5 mg via ORAL
  Filled 2023-04-14 (×5): qty 1

## 2023-04-14 MED ORDER — CEFAZOLIN SODIUM-DEXTROSE 2-4 GM/100ML-% IV SOLN
2.0000 g | INTRAVENOUS | Status: AC
  Administered 2023-04-14: 2 g via INTRAVENOUS
  Filled 2023-04-14: qty 100

## 2023-04-14 MED ORDER — CHLORHEXIDINE GLUCONATE 0.12 % MT SOLN
15.0000 mL | Freq: Once | OROMUCOSAL | Status: AC
  Administered 2023-04-14: 15 mL via OROMUCOSAL

## 2023-04-14 SURGICAL SUPPLY — 60 items
APL SKNCLS NONHYPOALLERGENIC (GAUZE/BANDAGES/DRESSINGS) ×1
APL SKNCLS STERI-STRIP NONHPOA (GAUZE/BANDAGES/DRESSINGS) ×1
ATTUNE PSFEM LTSZ6 NARCEM KNEE (Femur) IMPLANT
ATTUNE PSRP INSR SZ6 6 KNEE (Insert) IMPLANT
BAG COUNTER SPONGE SURGICOUNT (BAG) IMPLANT
BAG SPEC THK2 15X12 ZIP CLS (MISCELLANEOUS) ×1
BAG SPNG CNTER NS LX DISP (BAG)
BAG ZIPLOCK 12X15 (MISCELLANEOUS) ×2 IMPLANT
BASE TIBIA ATTUNE KNEE SYS SZ6 (Knees) IMPLANT
BENZOIN TINCTURE PRP APPL 2/3 (GAUZE/BANDAGES/DRESSINGS) ×2 IMPLANT
BLADE SAGITTAL 25.0X1.19X90 (BLADE) ×2 IMPLANT
BLADE SAW SGTL 13.0X1.19X90.0M (BLADE) ×2 IMPLANT
BLADE SURG SZ10 CARB STEEL (BLADE) ×4 IMPLANT
BNDG CMPR 5X62 HK CLSR LF (GAUZE/BANDAGES/DRESSINGS) ×1
BNDG CMPR MED 10X6 ELC LF (GAUZE/BANDAGES/DRESSINGS) ×1
BNDG ELASTIC 6INX 5YD STR LF (GAUZE/BANDAGES/DRESSINGS) ×2 IMPLANT
BNDG ELASTIC 6X10 VLCR STRL LF (GAUZE/BANDAGES/DRESSINGS) IMPLANT
BOOTIES KNEE HIGH SLOAN (MISCELLANEOUS) ×2 IMPLANT
BOWL SMART MIX CTS (DISPOSABLE) ×2 IMPLANT
BSPLAT TIB 6 CMNT ROT PLAT STR (Knees) ×1 IMPLANT
CEMENT HV SMART SET (Cement) ×4 IMPLANT
COVER SURGICAL LIGHT HANDLE (MISCELLANEOUS) ×2 IMPLANT
CUFF TOURN SGL QUICK 34 (TOURNIQUET CUFF) ×1
CUFF TRNQT CYL 34X4.125X (TOURNIQUET CUFF) ×2 IMPLANT
DRAPE INCISE IOBAN 66X45 STRL (DRAPES) ×2 IMPLANT
DRAPE U-SHAPE 47X51 STRL (DRAPES) ×2 IMPLANT
DRSG AQUACEL AG ADV 3.5X10 (GAUZE/BANDAGES/DRESSINGS) ×2 IMPLANT
DURAPREP 26ML APPLICATOR (WOUND CARE) ×2 IMPLANT
ELECT REM PT RETURN 15FT ADLT (MISCELLANEOUS) ×2 IMPLANT
GLOVE BIOGEL PI IND STRL 8 (GLOVE) ×4 IMPLANT
GLOVE ECLIPSE 7.5 STRL STRAW (GLOVE) ×4 IMPLANT
GOWN STRL REUS W/ TWL XL LVL3 (GOWN DISPOSABLE) ×4 IMPLANT
GOWN STRL REUS W/TWL XL LVL3 (GOWN DISPOSABLE) ×2
HANDPIECE INTERPULSE COAX TIP (DISPOSABLE) ×1
HOLDER FOLEY CATH W/STRAP (MISCELLANEOUS) IMPLANT
HOOD PEEL AWAY T7 (MISCELLANEOUS) ×6 IMPLANT
KIT TURNOVER KIT A (KITS) IMPLANT
MANIFOLD NEPTUNE II (INSTRUMENTS) ×2 IMPLANT
NDL HYPO 22X1.5 SAFETY MO (MISCELLANEOUS) ×4 IMPLANT
NEEDLE HYPO 22X1.5 SAFETY MO (MISCELLANEOUS) ×2 IMPLANT
NS IRRIG 1000ML POUR BTL (IV SOLUTION) ×2 IMPLANT
PACK TOTAL KNEE CUSTOM (KITS) ×2 IMPLANT
PADDING CAST COTTON 6X4 STRL (CAST SUPPLIES) ×2 IMPLANT
PATELLA MEDIAL ATTUN 35MM KNEE (Knees) IMPLANT
PIN STEINMAN FIXATION KNEE (PIN) IMPLANT
PROTECTOR NERVE ULNAR (MISCELLANEOUS) ×2 IMPLANT
SET HNDPC FAN SPRY TIP SCT (DISPOSABLE) ×2 IMPLANT
SPIKE FLUID TRANSFER (MISCELLANEOUS) ×4 IMPLANT
STRIP CLOSURE SKIN 1/2X4 (GAUZE/BANDAGES/DRESSINGS) IMPLANT
SUT MNCRL AB 3-0 PS2 18 (SUTURE) ×2 IMPLANT
SUT VIC AB 0 CT1 36 (SUTURE) ×2 IMPLANT
SUT VIC AB 1 CT1 36 (SUTURE) ×4 IMPLANT
SUT VIC AB 2-0 CT1 27 (SUTURE) ×1
SUT VIC AB 2-0 CT1 TAPERPNT 27 (SUTURE) ×2 IMPLANT
SYR CONTROL 10ML LL (SYRINGE) ×4 IMPLANT
TIBIA ATTUNE KNEE SYS BASE SZ6 (Knees) ×1 IMPLANT
TRAY FOLEY MTR SLVR 16FR STAT (SET/KITS/TRAYS/PACK) IMPLANT
TUBE SUCTION HIGH CAP CLEAR NV (SUCTIONS) ×2 IMPLANT
WATER STERILE IRR 1000ML POUR (IV SOLUTION) ×4 IMPLANT
WRAP KNEE MAXI GEL POST OP (GAUZE/BANDAGES/DRESSINGS) ×2 IMPLANT

## 2023-04-14 NOTE — Anesthesia Postprocedure Evaluation (Signed)
Anesthesia Post Note  Patient: [name]  Procedure(s) Performed: TOTAL KNEE ARTHROPLASTY (Left: Knee)     Patient location during evaluation: Nursing Unit Anesthesia Type: Regional and Spinal Level of consciousness: oriented and awake and alert Pain management: pain level controlled Vital Signs Assessment: post-procedure vital signs reviewed and stable Respiratory status: spontaneous breathing and respiratory function stable Cardiovascular status: blood pressure returned to baseline and stable Postop Assessment: no headache, no backache, no apparent nausea or vomiting and patient able to bend at knees Anesthetic complications: no   No notable events documented.  Last Vitals:  Vitals:   04/14/23 1035 04/14/23 1036  BP:    Pulse: 80 75  Resp: (!) 22 17  Temp:    SpO2: 90% (!) 89%    Last Pain:  Vitals:   04/14/23 1036  TempSrc:   PainSc: 6                  Trevor Iha

## 2023-04-14 NOTE — Op Note (Signed)
PATIENT ID:      [name]  MRN:     [id number] DOB/AGE:    06-02-1956 / 67 y.o.       OPERATIVE REPORT   DATE OF PROCEDURE:  04/14/2023      PREOPERATIVE DIAGNOSIS:   LEFT KNEE DEGENERATIVE JOINT DISEASE      Estimated body mass index is 33.99 kg/m as calculated from the following:   Height as of this encounter: 5\' 7"  (1.702 m).   Weight as of this encounter: 98.4 kg.                                                       POSTOPERATIVE DIAGNOSIS:   Same                                                                  PROCEDURE:  Procedure(s): TOTAL KNEE ARTHROPLASTY Using DepuyAttune RP implants #6N Femur, #6Tibia, 6 mm Attune RP bearing, 35 Patella    SURGEON: Harvie Junior  ASSISTANT:   Greggory Stallion PA-C   (Present and scrubbed throughout the case, critical for assistance with exposure, retraction, instrumentation, and closure.)        ANESTHESIA: spinal, 20cc Exparel, 50cc 0.25% Marcaine EBL: min cc FLUID REPLACEMENT: unk cc crystaloid TOURNIQUET: DRAINS: None TRANEXAMIC ACID: 1gm IV, 2gm topical COMPLICATIONS:  None         INDICATIONS FOR PROCEDURE: The patient has  LEFT KNEE DEGENERATIVE JOINT DISEASE, mild varus deformities, XR shows bone on bone arthritis, lateral subluxation of tibia. Patient has failed all conservative measures including anti-inflammatory medicines, narcotics, attempts at exercise and weight loss, cortisone injections and viscosupplementation.  Risks and benefits of surgery have been discussed, questions answered.   DESCRIPTION OF PROCEDURE: The patient identified by armband, received  IV antibiotics, in the holding area at [facility]. Patient taken to the operating room, appropriate anesthetic monitors were attached, and spinal anesthesia was  induced. IV Tranexamic acid was given. Lateral post and 2 surefoot positioners applied to the table, the lower extremity was then prepped and draped in usual sterile fashion from the toes to the high  thigh. Time-out procedure was performed. Greggory Stallion [facility], was present and scrubbed throughout the case, critical for assistance with, positioning, exposure, retraction, instrumentation, and closure.The skin and subcutaneous tissue along the incision was injected with 20 cc of a mixture of 20cc Exparel and 30cc Marcaine 50cc saline solution, using a 21-gauge by 1-1/2 inch needle. We began the operation, with the knee flexed 130 degrees, by making the anterior midline incision starting at handbreadth above the patella going over the patella 1 cm medial to and 4 cm distal to the tibial tubercle. Small bleeders in the skin and the subcutaneous tissue identified and cauterized. Transverse retinaculum was incised and reflected medially and a medial parapatellar arthrotomy was accomplished. the patella was everted and theprepatellar fat pad resected. The superficial medial collateral ligament was then elevated from anterior to posterior along the proximal flare of the tibia and anterior half of the menisci resected. The knee was hyperflexed exposing bone on  bone arthritis. Peripheral and notch osteophytes as well as the cruciate ligaments were then resected. We continued to work our way around posteriorly along the proximal tibia, and externally rotated the tibia subluxing it out from underneath the femur. A McHale PCL retractor was placed through the notch, a lateral Hohmann retractor, and anterolateral small homan retractor placed. We then entered the proximal tibia with the Depuy starter drill in line with the axis of the tibia followed by an intramedullary guide rod and 3-degree posterior slope cutting guide. The tibial cutting guide, was pinned into place allowing resection of 2 mm of bone medially and 10 mm of bone laterally. Satisfied with the tibial resection, we then entered the distal femur 2 mm anterior to the PCL origin with the starter drill, followed by the intramedullary guide rod and applied the distal  femoral cutting guide set at 9 mm, with 5 degrees of valgus. This was pinned along the epicondylar axis. At this point, the distal femoral cut was accomplished without difficulty. We then sized for a #6N femoral component and pinned the chamfer guide in 3 degrees of external rotation. The anterior, posterior, and chamfer cuts were accomplished without difficulty followed by the Attune RP box cutting guide and the box cut. We also removed posterior osteophytes from the posterior femoral condyles. The posterior capsule was injected with Exparel solution. The knee was brought into full extension. We checked our extension gap and fit a 6 mm trial lollipop. Distracting in extension with a lamina spreader,  bleeders in the posterior capsule, Posterior medial and posterior lateral gutter were cauterized.  The transexamic acid-soaked sponge was then placed in the gap of the knee in extension. The knee was flexed 30. The posterior patella cut was accomplished with the 9.5 mm Attune cutting guide, sized for a 35 mm dome, and the fixation pegs drilled.The knee was then once again hyperflexed exposing the proximal tibia. We sized for a # 6 tibial base plate, applied the smokestack and the conical reamer followed by the the Delta fin keel punch. We then hammered into place the Attune RP trial femoral component, drilled the lugs, inserted a  6 mm trial bearing, trial patellar button, and took the knee through range of motion from 0-130 degrees. Medial and lateral ligamentous stability was checked. No thumb pressure was required for patellar Tracking.  All trial components were removed, mating surfaces irrigated with pulse lavage, and dried with suction and sponges. 10 cc of the Exparel solution was applied to the cancellus bone of the patella distal femur and proximal tibia.  After waiting 30 seconds, the bony surfaces were again, dried with sponges. A double batch of DePuy HV cement was mixed and applied to all bony metallic  mating surfaces except for the posterior condyles of the femur itself. In order, we hammered into place the tibial tray and removed excess cement, the femoral component and removed excess cement. The final Attune RP bearing was inserted, and the knee brought to full extension with compression. The patellar button was clamped into place, and excess cement removed. The knee was held at 30 flexion with compression using the second surefoot, while the cement cured. The wound was irrigated out with normal saline solution pulse lavage. The rest of the Exparel was injected into the parapatellar arthrotomy, subcutaneous tissues, and periosteal tissues. The parapatellar arthrotomy was closed with running #1 Vicryl suture. The subcutaneous tissue with 3-0 undyed Vicryl suture, and the skin with running 3-0 SQ vicryl. An Aquacil dressing  and Ace wrap were applied. The patient was taken to recovery room without difficulty.   Harvie Junior 04/14/2023, 8:57 AM

## 2023-04-14 NOTE — Transfer of Care (Signed)
Immediate Anesthesia Transfer of Care Note  Patient: [name]  Procedure(s) Performed: TOTAL KNEE ARTHROPLASTY (Left: Knee)  Patient Location: PACU  Anesthesia Type:Spinal  Level of Consciousness: awake  Airway & Oxygen Therapy: Patient Spontanous Breathing and Patient connected to face mask oxygen  Post-op Assessment: Report given to RN and Post -op Vital signs reviewed and stable  Post vital signs: Reviewed and stable  Last Vitals:  Vitals Value Taken Time  BP    Temp    Pulse    Resp    SpO2      Last Pain:  Vitals:   04/14/23 0550  TempSrc: Oral         Complications: No notable events documented.

## 2023-04-14 NOTE — Evaluation (Signed)
Physical Therapy Evaluation Patient Details Name: [name] MRN: [id number] DOB: [birth year] Today's Date: 04/14/2023  History of Present Illness  67 yo female presents to therapy s/p R TKA on 04/14/2023 due to failure of conservative measures. Pt PMH includes but is not limited to: NSTEMI, OSA, angina, DM II, PAF, dCHF, CAD, HDL, HTN, pulmonary fibrosis, lung ca, PE, spontaneous pneumothorax, and R RTC repair.  Clinical Impression    [name] is a 67 y.o. female POD 0 s/p R TKA. Patient reports mod I with use of SPC or crutches with mobility at baseline. Patient is now limited by functional impairments (see PT problem list below) and requires min guard for bed mobility and min guard and cues for transfers. Patient was able to ambulate 3 feet with RW and min guard and recliner close limited due to reports of N and V. Patient instructed in exercise to facilitate ROM and circulation to manage edema. Patient will benefit from continued skilled PT interventions to address impairments and progress towards PLOF. Acute PT will follow to progress mobility and stair training in preparation for safe discharge home with family/significant other assist and HHPT services.      Recommendations for follow up therapy are one component of a multi-disciplinary discharge planning process, led by the attending physician.  Recommendations may be updated based on patient status, additional functional criteria and insurance authorization.  Follow Up Recommendations       Assistance Recommended at Discharge Intermittent Supervision/Assistance  Patient can return home with the following  A little help with walking and/or transfers;A little help with bathing/dressing/bathroom;Assistance with cooking/housework;Assist for transportation;Help with stairs or ramp for entrance    Equipment Recommendations None recommended by PT (pt reports DME in home setting)  Recommendations for Other Services       Functional  Status Assessment Patient has had a recent decline in their functional status and demonstrates the ability to make significant improvements in function in a reasonable and predictable amount of time.     Precautions / Restrictions Precautions Precautions: Knee;Fall Restrictions Weight Bearing Restrictions: No LLE Weight Bearing: Weight bearing as tolerated      Mobility  Bed Mobility Overal bed mobility: Needs Assistance Bed Mobility: Supine to Sit     Supine to sit: HOB elevated, Min guard     General bed mobility comments: cues    Transfers Overall transfer level: Needs assistance Equipment used: Rolling walker (2 wheels) Transfers: Sit to/from Stand Sit to Stand: Min guard           General transfer comment: once in standing pt became nauseated requiring pt to return to EOB. pt indicated that she was not feeling dizzy slightly light headed and attributes to medication with similar event earlier in the day in which pt was nauseated while still in the bed    Ambulation/Gait Ambulation/Gait assistance: Min guard Gait Distance (Feet): 3 Feet Assistive device: Rolling walker (2 wheels) Gait Pattern/deviations: Step-to pattern, Antalgic, Trunk flexed Gait velocity: decreased     General Gait Details: recliner close, limited due to N and V  Stairs            Wheelchair Mobility    Modified Rankin (Stroke Patients Only)       Balance Overall balance assessment: Needs assistance, History of Falls (2-3 falls especially with step navigation) Sitting-balance support: Feet supported Sitting balance-Leahy Scale: Good     Standing balance support: Bilateral upper extremity supported, During functional activity, Reliant on assistive device for  balance Standing balance-Leahy Scale: Poor                               Pertinent Vitals/Pain Pain Assessment Pain Assessment: 0-10 Pain Score: 7  Pain Location: L knne Pain Descriptors / Indicators:  Constant, Aching, Discomfort, Operative site guarding Pain Intervention(s): Limited activity within patient's tolerance, Monitored during session, Premedicated before session, Repositioned, Ice applied (pain medication administered ~ 30 mins prior to PT arrival)    Home Living Family/patient expects to be discharged to:: Private residence Living Arrangements: Spouse/significant other Available Help at Discharge: Family Type of Home: House Home Access: Stairs to enter Entrance Stairs-Rails: Left Entrance Stairs-Number of Steps: 2   Home Layout: One level Home Equipment: Teacher, English as a foreign language (2 wheels);Cane - single point;Crutches      Prior Function Prior Level of Function : Independent/Modified Independent             Mobility Comments: Mod I with SPC, mod I with all ADLs, self care tasks, IADLs       Hand Dominance        Extremity/Trunk Assessment        Lower Extremity Assessment Lower Extremity Assessment: LLE deficits/detail LLE Deficits / Details: ankle DF 4/5, PF 3/5 ; AA for SLR LLE Sensation: WNL    Cervical / Trunk Assessment Cervical / Trunk Assessment:  (wfl)  Communication   Communication: No difficulties  Cognition Arousal/Alertness: Awake/alert Behavior During Therapy: WFL for tasks assessed/performed Overall Cognitive Status: Within Functional Limits for tasks assessed                                          General Comments      Exercises Total Joint Exercises Ankle Circles/Pumps: AROM, Both, 20 reps   Assessment/Plan    PT Assessment Patient needs continued PT services  PT Problem List Decreased strength;Decreased range of motion;Decreased activity tolerance;Decreased balance;Decreased mobility;Decreased coordination;Decreased cognition;Pain       PT Treatment Interventions DME instruction;Gait training;Stair training;Functional mobility training;Therapeutic activities;Therapeutic exercise;Balance  training;Neuromuscular re-education;Patient/family education;Modalities    PT Goals (Current goals can be found in the Care Plan section)  Acute Rehab PT Goals Patient Stated Goal: L knee stability so it does not slip and no falls PT Goal Formulation: With patient Time For Goal Achievement: 04/28/23 Potential to Achieve Goals: Good    Frequency 7X/week     Co-evaluation               AM-PAC PT "6 Clicks" Mobility  Outcome Measure Help needed turning from your back to your side while in a flat bed without using bedrails?: A Little Help needed moving from lying on your back to sitting on the side of a flat bed without using bedrails?: A Little Help needed moving to and from a bed to a chair (including a wheelchair)?: A Little Help needed standing up from a chair using your arms (e.g., wheelchair or bedside chair)?: A Little Help needed to walk in hospital room?: A Little Help needed climbing 3-5 steps with a railing? : Total 6 Click Score: 16    End of Session Equipment Utilized During Treatment: Gait belt Activity Tolerance: Treatment limited secondary to medical complications (Comment) (N and V) Patient left: in chair;with call bell/phone within reach Nurse Communication: Mobility status PT Visit Diagnosis: Unsteadiness on feet (R26.81);Other abnormalities of  gait and mobility (R26.89);Muscle weakness (generalized) (M62.81);History of falling (Z91.81);Pain Pain - Right/Left: Left Pain - part of body: Knee    Time: 1610-9604 PT Time Calculation (min) (ACUTE ONLY): 34 min   Charges:   PT Evaluation $PT Eval Low Complexity: 1 Low PT Treatments $Therapeutic Activity: 8-22 mins         Johnny Bridge, PT Acute Rehab   Jacqualyn Posey 04/14/2023, 6:33 PM

## 2023-04-14 NOTE — Anesthesia Procedure Notes (Signed)
Anesthesia Regional Block: Adductor canal block   Pre-Anesthetic Checklist: , timeout performed,  Correct Patient, Correct Site, Correct Laterality,  Correct Procedure, Correct Position, site marked,  Risks and benefits discussed,  Surgical consent,  Pre-op evaluation,  At surgeon's request and post-op pain management  Laterality: Lower and Left  Prep: chloraprep       Needles:  Injection technique: Single-shot  Needle Type: Echogenic Needle     Needle Length: 9cm  Needle Gauge: 22     Additional Needles:   Procedures:,,,, ultrasound used (permanent image in chart),,    Narrative:  Start time: 04/14/2023 6:50 AM End time: 04/14/2023 6:59 AM Injection made incrementally with aspirations every 5 mL.  Performed by: Personally  Anesthesiologist: Trevor Iha, MD  Additional Notes: Block assessed prior to surgery. Pt tolerated procedure well.

## 2023-04-14 NOTE — Anesthesia Procedure Notes (Addendum)
Date/Time: 04/14/2023 7:25 AM  Performed by: Florene Route, CRNAOxygen Delivery Method: Simple face mask

## 2023-04-14 NOTE — Progress Notes (Signed)
Orthopedic Tech Progress Note Patient Details:  [name] June 15, 1956 [id number]  CPM Left Knee CPM Left Knee: On Left Knee Flexion (Degrees): 70 Left Knee Extension (Degrees): 0  Post Interventions Patient Tolerated: Well Instructions Provided: Care of device  [name] 04/14/2023, 10:36 AM

## 2023-04-14 NOTE — Anesthesia Procedure Notes (Signed)
Spinal  Patient location during procedure: OR Start time: 04/14/2023 7:21 AM End time: 04/14/2023 7:28 AM Reason for block: surgical anesthesia Staffing Performed: anesthesiologist  Anesthesiologist: Trevor Iha, MD Performed by: Trevor Iha, MD Authorized by: Trevor Iha, MD   Preanesthetic Checklist Completed: patient identified, IV checked, risks and benefits discussed, surgical consent, monitors and equipment checked, pre-op evaluation and timeout performed Spinal Block Patient position: sitting Prep: DuraPrep and site prepped and draped Patient monitoring: heart rate, cardiac monitor, continuous pulse ox and blood pressure Approach: midline Location: L3-4 Injection technique: single-shot Needle Needle type: Pencan  Needle gauge: 24 G Needle length: 10 cm Needle insertion depth: 8 cm Assessment Sensory level: T4 Events: CSF return Additional Notes  1 Attempt (s). Pt tolerated procedure well.

## 2023-04-14 NOTE — Progress Notes (Signed)
Pacu RN Report to floor given  Gave report to  RN. Room: 1344  Discussed surgery, meds given in OR and Pacu, VS, IV fluids given, EBL, urine output, pain and other pertinent information. Also discussed if pt had any family or friends here or belongings with them.   Pt has a bulky ace wrap dressing, ice knee pack and CPM machine on. She is tolerating the CPM well. I have given her 1mg  Dilaudid with good effect. She is currently sleeping. Tolerating PO fluids, crackers. +PP,   Pt exits my care.

## 2023-04-15 ENCOUNTER — Encounter (HOSPITAL_COMMUNITY): Payer: Self-pay | Admitting: Orthopedic Surgery

## 2023-04-15 DIAGNOSIS — M1712 Unilateral primary osteoarthritis, left knee: Secondary | ICD-10-CM | POA: Diagnosis not present

## 2023-04-15 LAB — CBC
HCT: 27.7 % — ABNORMAL LOW (ref 36.0–46.0)
Hemoglobin: 8.8 g/dL — ABNORMAL LOW (ref 12.0–15.0)
MCH: 30.4 pg (ref 26.0–34.0)
MCHC: 31.8 g/dL (ref 30.0–36.0)
MCV: 95.8 fL (ref 80.0–100.0)
Platelets: 256 10*3/uL (ref 150–400)
RBC: 2.89 MIL/uL — ABNORMAL LOW (ref 3.87–5.11)
RDW: 11.2 % — ABNORMAL LOW (ref 11.5–15.5)
WBC: 9.4 10*3/uL (ref 4.0–10.5)
nRBC: 0 % (ref 0.0–0.2)

## 2023-04-15 NOTE — Progress Notes (Signed)
Physical Therapy Treatment Patient Details Name: [name] MRN: [id number] DOB: 01-15-1956 Today's Date: 04/15/2023   History of Present Illness 67 yo female presents to therapy s/p L TKA on 04/14/2023 due to failure of conservative measures. Pt PMH includes but is not limited to: NSTEMI, OSA, angina, DM II, PAF, dCHF, CAD, HDL, HTN, pulmonary fibrosis, lung ca, PE, spontaneous pneumothorax, and R RTC repair.    PT Comments    Pt with slow progress limited by pain, nausea, and dizziness.  She was able to ambulate 6' with min A. Pt had some dizziness and nausea with ambulation - BP stable upon return to sitting.  Once pt recovered had her stand again - did have drop in BP but only to 126/63 and denied dizziness at this time. Pt needs further therapy prior to return home.     Recommendations for follow up therapy are one component of a multi-disciplinary discharge planning process, led by the attending physician.  Recommendations may be updated based on patient status, additional functional criteria and insurance authorization.  Follow Up Recommendations       Assistance Recommended at Discharge Intermittent Supervision/Assistance  Patient can return home with the following A little help with walking and/or transfers;A little help with bathing/dressing/bathroom;Assistance with cooking/housework;Assist for transportation;Help with stairs or ramp for entrance   Equipment Recommendations  None recommended by PT    Recommendations for Other Services       Precautions / Restrictions Precautions Precautions: Knee;Fall Restrictions LLE Weight Bearing: Weight bearing as tolerated     Mobility  Bed Mobility Overal bed mobility: Needs Assistance Bed Mobility: Supine to Sit     Supine to sit: Min assist     General bed mobility comments: increased time and cues with min A for L LE    Transfers Overall transfer level: Needs assistance Equipment used: Rolling walker (2  wheels) Transfers: Sit to/from Stand Sit to Stand: Min assist           General transfer comment: Performed x 2 with min A and cues for hand placment and L LE management    Ambulation/Gait Ambulation/Gait assistance: Min guard Gait Distance (Feet): 6 Feet Assistive device: Rolling walker (2 wheels) Gait Pattern/deviations: Step-to pattern, Decreased stride length, Decreased weight shift to left Gait velocity: decreased     General Gait Details: Cues for RW proximity and sequencing and cues for small steps for pain control;  After about 6' pt with some dizziness and nausea and had to return to sitting.   Stairs             Wheelchair Mobility    Modified Rankin (Stroke Patients Only)       Balance Overall balance assessment: Needs assistance, History of Falls Sitting-balance support: Feet supported, No upper extremity supported Sitting balance-Leahy Scale: Good     Standing balance support: Bilateral upper extremity supported, During functional activity, Reliant on assistive device for balance Standing balance-Leahy Scale: Poor                              Cognition Arousal/Alertness: Awake/alert Behavior During Therapy: WFL for tasks assessed/performed Overall Cognitive Status: Within Functional Limits for tasks assessed                                          Exercises Total Joint Exercises  Ankle Circles/Pumps: AROM, Both, 10 reps, Supine Quad Sets: AROM, Both, 10 reps, Supine (cues for gentle contraction due to reports of spasms) Heel Slides: AAROM, Left, 10 reps, Supine (limited ROM) Hip ABduction/ADduction: AAROM, Left, 10 reps, Supine Long Arc Quad: AAROM, Left, 10 reps, Seated Goniometric ROM: L knee ~5 to 50 degrees    General Comments General comments (skin integrity, edema, etc.): Pt with some dizziness and nausea with ambulation.  REturned to sitting and pt with dry heaving but no emesis.  BP was 156/71. Once pt  feeling better had her stand and BP wsa 132/71, walked 2 steps forward and back again, BP 126/63 standing after 3 mins. Denies dizziness this time.  BP back to 148/52 in sitting.      Pertinent Vitals/Pain Pain Assessment Pain Assessment: 0-10 Pain Score: 6  Pain Location: L knee Pain Descriptors / Indicators: Constant, Aching, Discomfort, Operative site guarding Pain Intervention(s): Limited activity within patient's tolerance, Monitored during session, Premedicated before session, Repositioned, Ice applied    Home Living                          Prior Function            PT Goals (current goals can now be found in the care plan section) Progress towards PT goals: Progressing toward goals    Frequency           PT Plan Current plan remains appropriate    Co-evaluation              AM-PAC PT "6 Clicks" Mobility   Outcome Measure  Help needed turning from your back to your side while in a flat bed without using bedrails?: A Little Help needed moving from lying on your back to sitting on the side of a flat bed without using bedrails?: A Little Help needed moving to and from a bed to a chair (including a wheelchair)?: A Little Help needed standing up from a chair using your arms (e.g., wheelchair or bedside chair)?: A Little Help needed to walk in hospital room?: Total Help needed climbing 3-5 steps with a railing? : Total 6 Click Score: 14    End of Session Equipment Utilized During Treatment: Gait belt Activity Tolerance: Other (comment) (limied by nausea and dizziness) Patient left: with chair alarm set;in chair;with call bell/phone within reach Nurse Communication: Mobility status PT Visit Diagnosis: Unsteadiness on feet (R26.81);Other abnormalities of gait and mobility (R26.89);Muscle weakness (generalized) (M62.81);History of falling (Z91.81);Pain Pain - Right/Left: Left Pain - part of body: Knee     Time: 5366-4403 PT Time Calculation (min)  (ACUTE ONLY): 30 min  Charges:  $Gait Training: 8-22 mins $Therapeutic Exercise: 8-22 mins                     Anise Salvo, PT Acute Rehab [facility] Rehab [phone]    Rayetta Humphrey 04/15/2023, 12:56 PM

## 2023-04-15 NOTE — Care Management Obs Status (Signed)
MEDICARE OBSERVATION STATUS NOTIFICATION   Patient Details  Name: [name] MRN: [id number] Date of Birth: 01-24-1956   Medicare Observation Status Notification Given:       Ewing Schlein, [place] 04/15/2023, 10:27 AM

## 2023-04-15 NOTE — TOC Transition Note (Signed)
Transition of Care [facility]) - CM/SW Discharge Note  Patient Details  Name: [name] MRN: [id number] Date of Birth: [birth year]  [facility] CM/SW Contact:  Ewing Schlein, LCSW Phone Number: 04/15/2023, 10:49 AM  Clinical Narrative: Patient is expected to discharge home after passing with PT. CSW met with patient to confirm discharge plan. Patient [facility]. Patient has a rolling walker at home, so there are no DME needs at this time. TOC signing off.    Final next level of care: OP Rehab Barriers to Discharge: No Barriers Identified  Patient Goals and CMS Choice Choice offered to / list presented to : NA  Discharge Plan and Services Additional resources added to the After Visit Summary for          DME Arranged: N/A DME Agency: NA  Social Determinants of Health (SDOH) Interventions SDOH Screenings   Food Insecurity: No Food Insecurity (04/14/2023)  Housing: Low Risk  (04/14/2023)  Transportation Needs: No Transportation Needs (04/14/2023)  Utilities: Not At Risk (04/14/2023)  Depression (PHQ2-9): Low Risk  (07/12/2021)  Financial Resource Strain: Low Risk  (07/12/2021)  Physical Activity: Inactive (07/12/2021)  Stress: No Stress Concern Present (07/12/2021)  Tobacco Use: Medium Risk (04/14/2023)   Readmission Risk Interventions     No data to display

## 2023-04-15 NOTE — Progress Notes (Signed)
Physical Therapy Treatment Patient Details Name: [name] MRN: [id number] DOB: 03/24/1956 Today's Date: 04/15/2023   History of Present Illness 67 yo female presents to therapy s/p L TKA on 04/14/2023 due to failure of conservative measures. Pt PMH includes but is not limited to: NSTEMI, OSA, angina, DM II, PAF, dCHF, CAD, HDL, HTN, pulmonary fibrosis, lung ca, PE, spontaneous pneumothorax, and R RTC repair.    PT Comments    Pt making gradual progress; however, was still limited by nausea, dizziness, and heart racing.  Pt ambulated 24' with antalgic pattern.  She then developed nausea, diaphoresis, and felt heart racing (up to 124 bpm).  Pt had to return to sitting.  Again, no significant drop or low BP, but have not been able to obtain pressure while pt symptomatic in standing (due to nausea/dry heaving and having to sit).  Continue to progress as able.     Recommendations for follow up therapy are one component of a multi-disciplinary discharge planning process, led by the attending physician.  Recommendations may be updated based on patient status, additional functional criteria and insurance authorization.  Follow Up Recommendations       Assistance Recommended at Discharge Intermittent Supervision/Assistance  Patient can return home with the following A little help with walking and/or transfers;A little help with bathing/dressing/bathroom;Assistance with cooking/housework;Assist for transportation;Help with stairs or ramp for entrance   Equipment Recommendations  None recommended by PT    Recommendations for Other Services       Precautions / Restrictions Precautions Precautions: Knee;Fall Restrictions LLE Weight Bearing: Weight bearing as tolerated     Mobility  Bed Mobility Overal bed mobility: Needs Assistance Bed Mobility: Sit to Supine     Supine to sit: Min assist Sit to supine: Min assist   General bed mobility comments: increased time and cues with min A  for L LE    Transfers Overall transfer level: Needs assistance Equipment used: Rolling walker (2 wheels) Transfers: Sit to/from Stand Sit to Stand: Min assist           General transfer comment: From low wheel chair with min A and cues for L LE management    Ambulation/Gait Ambulation/Gait assistance: Min guard Gait Distance (Feet): 24 Feet Assistive device: Rolling walker (2 wheels) Gait Pattern/deviations: Step-to pattern, Decreased stride length, Decreased weight shift to left Gait velocity: decreased     General Gait Details: Pt with very antalgic pattern that improved somewhat with cues for smaller steps on R and use of UE when weight shifting to L for pain control. Pt again became nauseated with "heart racing" while walking and had to return to sitting.   Stairs             Wheelchair Mobility    Modified Rankin (Stroke Patients Only)       Balance Overall balance assessment: Needs assistance, History of Falls Sitting-balance support: Feet supported, No upper extremity supported Sitting balance-Leahy Scale: Good     Standing balance support: Bilateral upper extremity supported, During functional activity, Reliant on assistive device for balance Standing balance-Leahy Scale: Poor Standing balance comment: RW and min A at times                            Cognition Arousal/Alertness: Awake/alert Behavior During Therapy: WFL for tasks assessed/performed Overall Cognitive Status: Within Functional Limits for tasks assessed  Exercises Total Joint Exercises Ankle Circles/Pumps: AROM, Both, 10 reps, Supine Quad Sets: AROM, Both, 10 reps, Supine (cues for gentle contraction due to reports of spasms)  Goniometric ROM: L knee ~5 to 50 degrees    General Comments General comments (skin integrity, edema, etc.): Pt was standing upon arrival after just using BSC with N.  She denied any  dizziness or nausea- checked and BP was 193/71 (however question accuracy as pt was gripping RW and has not had elevated BP). Pt was able to ambulate 24' but then felt heart racing, diaphoretic, and with nausea/dry heaving.  Nursing assisted with w/c and pt returned to sitting. BP was 162/68 in sitting with HR of 111.  Once pt felt better , had her stand for transfer back to bed.  Obtained BP in standing which was148/73 with HR up to 124.  Pt denied dizziness but did report felt heart racing.  RN present and aware. Pt feeling better in supine.      Pertinent Vitals/Pain Pain Assessment Pain Assessment: No/denies pain Pain Score: 4  Pain Location: L knee Pain Descriptors / Indicators: Discomfort, Operative site guarding Pain Intervention(s): Limited activity within patient's tolerance, Monitored during session, Premedicated before session, Repositioned, Ice applied    Home Living                          Prior Function            PT Goals (current goals can now be found in the care plan section) Progress towards PT goals: Progressing toward goals    Frequency    7X/week      PT Plan Current plan remains appropriate    Co-evaluation              AM-PAC PT "6 Clicks" Mobility   Outcome Measure  Help needed turning from your back to your side while in a flat bed without using bedrails?: A Little Help needed moving from lying on your back to sitting on the side of a flat bed without using bedrails?: A Little Help needed moving to and from a bed to a chair (including a wheelchair)?: A Little Help needed standing up from a chair using your arms (e.g., wheelchair or bedside chair)?: A Little Help needed to walk in hospital room?: A Lot Help needed climbing 3-5 steps with a railing? : Total 6 Click Score: 15    End of Session Equipment Utilized During Treatment: Gait belt Activity Tolerance: Other (comment) (limited by nausea/dizziness) Patient left: with call  bell/phone within reach;in bed;with bed alarm set Nurse Communication: Mobility status PT Visit Diagnosis: Unsteadiness on feet (R26.81);Other abnormalities of gait and mobility (R26.89);Muscle weakness (generalized) (M62.81);History of falling (Z91.81);Pain Pain - Right/Left: Left Pain - part of body: Knee     Time: 1554-1630 PT Time Calculation (min) (ACUTE ONLY): 36 min  Charges:  $Gait Training: 8-22 mins $Therapeutic Exercise: 8-22 mins                     Anise Salvo, PT Acute Re[facility] [phone]    Rayetta Humphrey 04/15/2023, 4:41 PM

## 2023-04-15 NOTE — Progress Notes (Signed)
Subjective: 1 Day Post-Op Procedure(s) (LRB): TOTAL KNEE ARTHROPLASTY (Left) Patient reports pain as moderate.    Objective: Vital signs in last 24 hours: Temp:  [97.5 F (36.4 C)-98.1 F (36.7 C)] 98 F (36.7 C) (06/25 0551) Pulse Rate:  [63-88] 88 (06/25 0551) Resp:  [12-23] 17 (06/25 0551) BP: (125-149)/(57-84) 125/57 (06/25 0551) SpO2:  [89 %-100 %] 93 % (06/25 0551)  Intake/Output from previous day: 06/24 0701 - 06/25 0700 In: 1754.4 [P.O.:120; I.V.:1234.4; IV Piggyback:400] Out: 1350 [Urine:1300; Blood:50] Intake/Output this shift: No intake/output data recorded.  Recent Labs    04/15/23 0338  HGB 8.8*   Recent Labs    04/15/23 0338  WBC 9.4  RBC 2.89*  HCT 27.7*  PLT 256   No results for input(s): "NA", "K", "CL", "CO2", "BUN", "CREATININE", "GLUCOSE", "CALCIUM" in the last 72 hours. No results for input(s): "LABPT", "INR" in the last 72 hours.  Neurologically intact ABD soft Neurovascular intact Sensation intact distally Intact pulses distally Dorsiflexion/Plantar flexion intact Incision: dressing C/D/I and no drainage No cellulitis present Compartment soft  Assessment/Plan: 1 Day Post-Op Procedure(s) (LRB): TOTAL KNEE ARTHROPLASTY (Left) Patient did well overnight.  She had difficulty keeping the TED hose in place.  I'll order a larger hose for her to utilize as needed for DVT prophylaxis.  She only walked a couple of steps yesterday.  She'll continue to work with PT and should be ok to d/c home once she passes PT. Advance diet Up with therapy Plan for discharge tomorrow  Patient's anticipated LOS is less than 2 midnights, meeting these requirements: - Younger than 50 - Lives within 1 hour of care - Has a competent adult at home to recover with post-op recover - NO history of  - Chronic pain requiring opiods  - Diabetes  - Coronary Artery Disease  - Heart failure  - Heart attack  - Stroke  - DVT/VTE  - Cardiac arrhythmia  - Respiratory  Failure/COPD  - Renal failure  - Anemia  - Advanced Liver disease  Noreene [facility] 04/15/2023, 7:46 AM

## 2023-04-16 DIAGNOSIS — M1712 Unilateral primary osteoarthritis, left knee: Secondary | ICD-10-CM | POA: Diagnosis not present

## 2023-04-16 LAB — CBC
HCT: 24.2 % — ABNORMAL LOW (ref 36.0–46.0)
Hemoglobin: 7.8 g/dL — ABNORMAL LOW (ref 12.0–15.0)
MCH: 30.7 pg (ref 26.0–34.0)
MCHC: 32.2 g/dL (ref 30.0–36.0)
MCV: 95.3 fL (ref 80.0–100.0)
Platelets: 254 10*3/uL (ref 150–400)
RBC: 2.54 MIL/uL — ABNORMAL LOW (ref 3.87–5.11)
RDW: 11.4 % — ABNORMAL LOW (ref 11.5–15.5)
WBC: 10.8 10*3/uL — ABNORMAL HIGH (ref 4.0–10.5)
nRBC: 0 % (ref 0.0–0.2)

## 2023-04-16 NOTE — Progress Notes (Signed)
Subjective: 2 Days Post-Op Procedure(s) (LRB): TOTAL KNEE ARTHROPLASTY (Left) Patient reports pain as moderate.  She endorses some nausea secondary to pain and vomiting last night.  Objective: Vital signs in last 24 hours: Temp:  [97.8 F (36.6 C)-98.9 F (37.2 C)] 97.9 F (36.6 C) (06/26 0654) Pulse Rate:  [88-110] 100 (06/26 0654) Resp:  [15-20] 18 (06/26 0654) BP: (119-148)/(55-71) 140/69 (06/26 0654) SpO2:  [94 %-100 %] 95 % (06/26 0654)  Intake/Output from previous day: 06/25 0701 - 06/26 0700 In: 3803.3 [P.O.:1200; I.V.:2603.3] Out: 200 [Urine:200] Intake/Output this shift: No intake/output data recorded.  Recent Labs    04/15/23 0338 04/16/23 0344  HGB 8.8* 7.8*   Recent Labs    04/15/23 0338 04/16/23 0344  WBC 9.4 10.8*  RBC 2.89* 2.54*  HCT 27.7* 24.2*  PLT 256 254   No results for input(s): "NA", "K", "CL", "CO2", "BUN", "CREATININE", "GLUCOSE", "CALCIUM" in the last 72 hours. No results for input(s): "LABPT", "INR" in the last 72 hours.  Neurologically intact ABD soft Neurovascular intact Sensation intact distally Intact pulses distally Dorsiflexion/Plantar flexion intact Incision: dressing C/D/I and no drainage No cellulitis present Compartment soft   Assessment/Plan: 2 Days Post-Op Procedure(s) (LRB): TOTAL KNEE ARTHROPLASTY (Left) The patient had difficulty with getting up and moving yesterday.  She had some nausea with vomiting and states she felt as though her heart rate increased.  She is significantly anemic with a hemoglobin drop to 7.8.  Will order a CBC to recheck later tonight or in the morning.  We will have her continue to work with physical therapy in hopes of discharge home tomorrow. Advance diet Up with therapy Plan for discharge tomorrow Anticipated LOS equal to or greater than 2 midnights due to - Age 65 and older with one or more of the following: - Obesity - Expected need for hospital services (PT, OT, Nursing) required for  safe discharge - Anticipated need for postoperative skilled nursing care or inpatient rehab    Shanon Payor 04/16/2023, 7:15 AM

## 2023-04-16 NOTE — Plan of Care (Signed)
  Problem: Education: Goal: Knowledge of the prescribed therapeutic regimen will improve Outcome: Adequate for Discharge Goal: Individualized Educational Video(s) Outcome: Completed/Met   Problem: Activity: Goal: Ability to avoid complications of mobility impairment will improve Outcome: Progressing Goal: Range of joint motion will improve Outcome: Adequate for Discharge   Problem: Clinical Measurements: Goal: Postoperative complications will be avoided or minimized Outcome: Progressing   Problem: Pain Management: Goal: Pain level will decrease with appropriate interventions Outcome: Adequate for Discharge   Problem: Skin Integrity: Goal: Will show signs of wound healing Outcome: Progressing   Problem: Education: Goal: Knowledge of General Education information will improve Description: Including pain rating scale, medication(s)/side effects and non-pharmacologic comfort measures Outcome: Progressing   Problem: Health Behavior/Discharge Planning: Goal: Ability to manage health-related needs will improve Outcome: Progressing   Problem: Clinical Measurements: Goal: Ability to maintain clinical measurements within normal limits will improve Outcome: Progressing Goal: Will remain free from infection Outcome: Progressing Goal: Diagnostic test results will improve Outcome: Progressing Goal: Respiratory complications will improve Outcome: Adequate for Discharge Goal: Cardiovascular complication will be avoided Outcome: Progressing   Problem: Activity: Goal: Risk for activity intolerance will decrease Outcome: Adequate for Discharge   Problem: Nutrition: Goal: Adequate nutrition will be maintained Outcome: Completed/Met   Problem: Coping: Goal: Level of anxiety will decrease Outcome: Progressing   Problem: Elimination: Goal: Will not experience complications related to bowel motility Outcome: Progressing Goal: Will not experience complications related to urinary  retention Outcome: Completed/Met   Problem: Pain Managment: Goal: General experience of comfort will improve Outcome: Progressing   Problem: Skin Integrity: Goal: Will show signs of wound healing Outcome: Progressing   Problem: Education: Goal: Knowledge of General Education information will improve Description: Including pain rating scale, medication(s)/side effects and non-pharmacologic comfort measures Outcome: Progressing   Problem: Health Behavior/Discharge Planning: Goal: Ability to manage health-related needs will improve Outcome: Progressing   Problem: Clinical Measurements: Goal: Ability to maintain clinical measurements within normal limits will improve Outcome: Progressing Goal: Will remain free from infection Outcome: Progressing Goal: Diagnostic test results will improve Outcome: Progressing Goal: Respiratory complications will improve Outcome: Adequate for Discharge Goal: Cardiovascular complication will be avoided Outcome: Progressing   Problem: Coping: Goal: Level of anxiety will decrease Outcome: Progressing   Problem: Elimination: Goal: Will not experience complications related to bowel motility Outcome: Progressing Goal: Will not experience complications related to urinary retention Outcome: Completed/Met

## 2023-04-16 NOTE — Progress Notes (Signed)
Physical Therapy Treatment Patient Details Name: GLENDI MOHIUDDIN MRN: [id number] DOB: [birth year] Today's Date: 04/16/2023   History of Present Illness 67 yo female presents to therapy s/p L TKA on 04/14/2023 due to failure of conservative measures. Pt PMH includes but is not limited to: NSTEMI, OSA, angina, DM II, PAF, dCHF, CAD, HDL, HTN, pulmonary fibrosis, lung ca, PE, spontaneous pneumothorax, and R RTC repair.    PT Comments    POD # 2 pm session Spouse present for "Family Education". Pt sitting in recliner.  General Gait Details: had Spouse "hands on" assist pt with amb in hallway a functional distance.  Pt using her Apple watch to monitor HR which was 117. General stair comments: With Spouse "hands on" using safety belt attempted to navigate 2 stair steps.  Near fall as L knee buckled.  Therapist and Spouse recovered.  Computer chair placed under pt for her to sit.  Spouse, Therapist and Rehab Tech were present.  Will attempt BACKWARD approach next visit. Assisted back to bed and applied ICE.  Pt plans to D/C to home tomorrow.  Arranged to see at 11:30am with spouse for stair training.     Recommendations for follow up therapy are one component of a multi-disciplinary discharge planning process, led by the attending physician.  Recommendations may be updated based on patient status, additional functional criteria and insurance authorization.  Follow Up Recommendations       Assistance Recommended at Discharge Intermittent Supervision/Assistance  Patient can return home with the following A little help with walking and/or transfers;A little help with bathing/dressing/bathroom;Assistance with cooking/housework;Assist for transportation;Help with stairs or ramp for entrance   Equipment Recommendations  None recommended by PT    Recommendations for Other Services       Precautions / Restrictions Precautions Precautions: Knee;Fall Precaution Comments: no pillow under  knee Restrictions Weight Bearing Restrictions: No LLE Weight Bearing: Weight bearing as tolerated     Mobility  Bed Mobility Overal bed mobility: Needs Assistance Bed Mobility:      Sit to supine: Min assist, Mod assist   General bed mobility comments: demonstarted and instructed on how to use a belt to self assist LE    Transfers Overall transfer level: Needs assistance Equipment used: Rolling walker (2 wheels) Transfers: Sit to/from Stand Sit to Stand: Min assist           General transfer comment: VC's on proper hand placement and safety with turns.    Ambulation/Gait Ambulation/Gait assistance: Min guard Gait Distance (Feet): 45 Feet Assistive device: Rolling walker (2 wheels) Gait Pattern/deviations: Step-to pattern, Decreased stride length, Decreased weight shift to left Gait velocity: decreased     General Gait Details: had Spouse "hands on" assist pt with amb in hallway a functional distance.  Pt using her Apple watch to monitor HR which was 117.   Stairs Stairs: Yes Stairs assistance: Max assist Stair Management: One rail Left Number of Stairs: 2 General stair comments: With Spouse "hands on" using safety belt attempted to navigate 2 stair steps.  Near fall as L knee buckled.  Therapist and Spouse recovered.  Computer chair placed under pt for her to sit.  Spouse, Therapist and Rehab Tech were present.  Will attempt BACKWARD approach next visit.   Wheelchair Mobility    Modified Rankin (Stroke Patients Only)       Balance  Cognition Arousal/Alertness: Awake/alert Behavior During Therapy: WFL for tasks assessed/performed Overall Cognitive Status: Within Functional Limits for tasks assessed                                 General Comments: AxO x 3 very pleasant, feeling "a little better today".        Exercises      General Comments        Pertinent  Vitals/Pain Pain Assessment Pain Assessment: 0-10 Pain Score: 7  Pain Location: L knee Pain Descriptors / Indicators: Discomfort, Operative site guarding Pain Intervention(s): Monitored during session, Premedicated before session, Repositioned, Ice applied    Home Living                          Prior Function            PT Goals (current goals can now be found in the care plan section)      Frequency    7X/week      PT Plan Current plan remains appropriate    Co-evaluation              AM-PAC PT "6 Clicks" Mobility   Outcome Measure  Help needed turning from your back to your side while in a flat bed without using bedrails?: A Little Help needed moving from lying on your back to sitting on the side of a flat bed without using bedrails?: A Little Help needed moving to and from a bed to a chair (including a wheelchair)?: A Little Help needed standing up from a chair using your arms (e.g., wheelchair or bedside chair)?: A Little Help needed to walk in hospital room?: A Little Help needed climbing 3-5 steps with a railing? : A Lot 6 Click Score: 17    End of Session Equipment Utilized During Treatment: Gait belt Activity Tolerance: Patient tolerated treatment well Patient left: in bed;with family/visitor present Nurse Communication: Mobility status PT Visit Diagnosis: Unsteadiness on feet (R26.81);Other abnormalities of gait and mobility (R26.89);Muscle weakness (generalized) (M62.81);History of falling (Z91.81);Pain Pain - Right/Left: Left Pain - part of body: Knee     Time: 1430-1500 PT Time Calculation (min) (ACUTE ONLY): 30 min  Charges:  $Gait Training: 8-22 mins $Therapeutic Activity: 8-22 mins                    Felecia Shelling  PTA Acute  Rehabilitation Services Office M-F          [phone]

## 2023-04-16 NOTE — Progress Notes (Addendum)
Physical Therapy Treatment Patient Details Name: [name] MRN: [id number] DOB: September 23, 1956 Today's Date: 04/16/2023   History of Present Illness 67 yo female presents to therapy s/p L TKA on 04/14/2023 due to failure of conservative measures. Pt PMH includes but is not limited to: NSTEMI, OSA, angina, DM II, PAF, dCHF, CAD, HDL, HTN, pulmonary fibrosis, lung ca, PE, spontaneous pneumothorax, and R RTC repair.    PT Comments    POD # 2 am session General Comments: AxO x 3 very pleasant, feeling "a little better today".  Assisted OOB to amb required increased time.  General bed mobility comments: demonstarted and instructed on how to use a belt to self assist LE  General transfer comment: VC's on proper hand placement and safety with turns.  Also assisted with a toilet transfer.  General Gait Details: assisted with amb to and from bathroom 12 feet x 2 with increased time.  VC's on proper sequencing and upright posture.  Pt using her Apple watch to monitor her HR which was 133.  Assisted to recliner to perform a few TE's followed by ICE. Will see pt again this afternoon. Pt plans to D/C to home tomorrow.   Recommendations for follow up therapy are one component of a multi-disciplinary discharge planning process, led by the attending physician.  Recommendations may be updated based on patient status, additional functional criteria and insurance authorization.  Follow Up Recommendations       Assistance Recommended at Discharge Intermittent Supervision/Assistance  Patient can return home with the following A little help with walking and/or transfers;A little help with bathing/dressing/bathroom;Assistance with cooking/housework;Assist for transportation;Help with stairs or ramp for entrance   Equipment Recommendations  None recommended by PT    Recommendations for Other Services       Precautions / Restrictions Precautions Precautions: Knee;Fall Precaution Comments: no pillow under  knee Restrictions Weight Bearing Restrictions: No LLE Weight Bearing: Weight bearing as tolerated     Mobility  Bed Mobility Overal bed mobility: Needs Assistance Bed Mobility: Supine to Sit     Supine to sit: Min assist Sit to supine: Min assist   General bed mobility comments: demonstarted and instructed on how to use a belt to self assist LE    Transfers Overall transfer level: Needs assistance Equipment used: Rolling walker (2 wheels) Transfers: Sit to/from Stand Sit to Stand: Min assist           General transfer comment: VC's on proper hand placement and safety with turns.    Ambulation/Gait Ambulation/Gait assistance: Min guard Gait Distance (Feet): 24 Feet Assistive device: Rolling walker (2 wheels) Gait Pattern/deviations: Step-to pattern, Decreased stride length, Decreased weight shift to left Gait velocity: decreased     General Gait Details: assisted with amb to and from bathroom 12 feet x 2 with increased time.  VC's on proper sequencing and upright posture.  Pt using her Apple watch to monitor her HR which was 133.   Stairs             Wheelchair Mobility    Modified Rankin (Stroke Patients Only)       Balance                                            Cognition Arousal/Alertness: Awake/alert Behavior During Therapy: WFL for tasks assessed/performed Overall Cognitive Status: Within Functional Limits for tasks assessed  General Comments: AxO x 3 very pleasant, feeling "a little better today".        Exercises   Total Knee Replacement TE's following HEP handout 10 reps B LE ankle pumps 05 reps towel squeezes 05 reps knee presses 05 reps heel slides  Educated on use of gait belt to assist with TE's Followed by ICE    General Comments        Pertinent Vitals/Pain Pain Assessment Pain Assessment: 0-10 Pain Score: 7  Pain Location: L knee Pain Descriptors /  Indicators: Discomfort, Operative site guarding Pain Intervention(s): Monitored during session, Premedicated before session, Repositioned, Ice applied    Home Living                          Prior Function            PT Goals (current goals can now be found in the care plan section)      Frequency    7X/week      PT Plan Current plan remains appropriate    Co-evaluation              AM-PAC PT "6 Clicks" Mobility   Outcome Measure  Help needed turning from your back to your side while in a flat bed without using bedrails?: A Little Help needed moving from lying on your back to sitting on the side of a flat bed without using bedrails?: A Little Help needed moving to and from a bed to a chair (including a wheelchair)?: A Little Help needed standing up from a chair using your arms (e.g., wheelchair or bedside chair)?: A Little Help needed to walk in hospital room?: A Little Help needed climbing 3-5 steps with a railing? : A Lot 6 Click Score: 17    End of Session Equipment Utilized During Treatment: Gait belt Activity Tolerance: Patient tolerated treatment well Patient left: in chair;with call bell/phone within reach Nurse Communication: Mobility status PT Visit Diagnosis: Unsteadiness on feet (R26.81);Other abnormalities of gait and mobility (R26.89);Muscle weakness (generalized) (M62.81);History of falling (Z91.81);Pain Pain - Right/Left: Left Pain - part of body: Knee     Time: 1610-9604 PT Time Calculation (min) (ACUTE ONLY): 30 min  Charges:  $Gait Training: 8-22 mins $Therapeutic Activity: 8-22 mins                     Felecia Shelling  PTA Acute  Rehabilitation Services Office M-F          [phone]

## 2023-04-17 DIAGNOSIS — M1712 Unilateral primary osteoarthritis, left knee: Secondary | ICD-10-CM | POA: Diagnosis not present

## 2023-04-17 LAB — CBC WITH DIFFERENTIAL/PLATELET
Abs Immature Granulocytes: 0.09 10*3/uL — ABNORMAL HIGH (ref 0.00–0.07)
Basophils Absolute: 0 10*3/uL (ref 0.0–0.1)
Basophils Relative: 0 %
Eosinophils Absolute: 0 10*3/uL (ref 0.0–0.5)
Eosinophils Relative: 0 %
HCT: 22.4 % — ABNORMAL LOW (ref 36.0–46.0)
Hemoglobin: 7.2 g/dL — ABNORMAL LOW (ref 12.0–15.0)
Immature Granulocytes: 1 %
Lymphocytes Relative: 17 %
Lymphs Abs: 1.7 10*3/uL (ref 0.7–4.0)
MCH: 31 pg (ref 26.0–34.0)
MCHC: 32.1 g/dL (ref 30.0–36.0)
MCV: 96.6 fL (ref 80.0–100.0)
Monocytes Absolute: 1.3 10*3/uL — ABNORMAL HIGH (ref 0.1–1.0)
Monocytes Relative: 14 %
Neutro Abs: 6.6 10*3/uL (ref 1.7–7.7)
Neutrophils Relative %: 68 %
Platelets: 265 10*3/uL (ref 150–400)
RBC: 2.32 MIL/uL — ABNORMAL LOW (ref 3.87–5.11)
RDW: 11.8 % (ref 11.5–15.5)
WBC: 9.8 10*3/uL (ref 4.0–10.5)
nRBC: 0 % (ref 0.0–0.2)

## 2023-04-17 MED ORDER — OXYCODONE HCL 5 MG PO TABS: 5.0000 mg | ORAL_TABLET | Freq: Four times a day (QID) | ORAL | 0 refills | Status: DC | PRN

## 2023-04-17 MED ORDER — DOCUSATE SODIUM 100 MG PO CAPS: 100.0000 mg | ORAL_CAPSULE | Freq: Two times a day (BID) | ORAL | 0 refills | Status: DC | PRN

## 2023-04-17 MED ORDER — TIZANIDINE HCL 4 MG PO TABS: 4.0000 mg | ORAL_TABLET | Freq: Three times a day (TID) | ORAL | 0 refills | Status: DC | PRN

## 2023-04-17 NOTE — Plan of Care (Signed)
  Problem: Education: Goal: Knowledge of the prescribed therapeutic regimen will improve Outcome: Adequate for Discharge   Problem: Activity: Goal: Ability to avoid complications of mobility impairment will improve Outcome: Completed/Met Goal: Range of joint motion will improve Outcome: Adequate for Discharge   Problem: Clinical Measurements: Goal: Postoperative complications will be avoided or minimized Outcome: Progressing   Problem: Pain Management: Goal: Pain level will decrease with appropriate interventions Outcome: Adequate for Discharge   Problem: Skin Integrity: Goal: Will show signs of wound healing Outcome: Progressing   Problem: Education: Goal: Knowledge of General Education information will improve Description: Including pain rating scale, medication(s)/side effects and non-pharmacologic comfort measures Outcome: Adequate for Discharge   Problem: Health Behavior/Discharge Planning: Goal: Ability to manage health-related needs will improve Outcome: Adequate for Discharge   Problem: Clinical Measurements: Goal: Ability to maintain clinical measurements within normal limits will improve Outcome: Adequate for Discharge Goal: Will remain free from infection Outcome: Progressing Goal: Diagnostic test results will improve Outcome: Progressing Goal: Respiratory complications will improve Outcome: Completed/Met Goal: Cardiovascular complication will be avoided Outcome: Completed/Met   Problem: Activity: Goal: Risk for activity intolerance will decrease Outcome: Adequate for Discharge   Problem: Coping: Goal: Level of anxiety will decrease Outcome: Progressing   Problem: Elimination: Goal: Will not experience complications related to bowel motility Outcome: Progressing   Problem: Pain Managment: Goal: General experience of comfort will improve Outcome: Completed/Met   Problem: Safety: Goal: Ability to remain free from injury will improve Outcome:  Adequate for Discharge   Problem: Skin Integrity: Goal: Risk for impaired skin integrity will decrease Outcome: Completed/Met

## 2023-04-17 NOTE — Progress Notes (Signed)
Physical Therapy Treatment Patient Details Name: [name] MRN: [id number] DOB: 1956-10-10 Today's Date: 04/17/2023   History of Present Illness 67 yo female presents to therapy s/p L TKA on 04/14/2023 due to failure of conservative measures. Pt PMH includes but is not limited to: NSTEMI, OSA, angina, DM II, PAF, dCHF, CAD, HDL, HTN, pulmonary fibrosis, lung ca, PE, spontaneous pneumothorax, and R RTC repair.    PT Comments    POD # 3 General Comments: AxO x 3 very pleasant, feeling apprehensive about going home. Spouse present during session for "Family Training".  General bed mobility comments: self able with increased time using strap.  General transfer comment: pt self able to rise with no physical assistance.  General stair comments: with Spouse "hands on" assisted pt up 2 steps backward with walker while securing her with safety belt and walker with his hip.  General Gait Details: amb with Spouse "hands on" using safety belt. Pt able to amb a functional distance with increased time.   Pt using her Apple watch to monitor her HR which was 74. Then returned to room to perform some TE's following HEP handout.  Instructed on proper tech, freq as well as use of ICE.   Addressed all mobility questions, discussed appropriate activity, educated on use of ICE.  Pt ready for D/C to home. Pt HAS met a mobility level to D/C to home with family support.   Recommendations for follow up therapy are one component of a multi-disciplinary discharge planning process, led by the attending physician.  Recommendations may be updated based on patient status, additional functional criteria and insurance authorization.  Follow Up Recommendations       Assistance Recommended at Discharge Intermittent Supervision/Assistance  Patient can return home with the following A little help with walking and/or transfers;A little help with bathing/dressing/bathroom;Assistance with cooking/housework;Assist for  transportation;Help with stairs or ramp for entrance   Equipment Recommendations  None recommended by PT    Recommendations for Other Services       Precautions / Restrictions Precautions Precautions: Knee;Fall Precaution Comments: no pillow under knee Restrictions Weight Bearing Restrictions: No LLE Weight Bearing: Weight bearing as tolerated     Mobility  Bed Mobility Overal bed mobility: Needs Assistance Bed Mobility: Supine to Sit     Supine to sit: Supervision     General bed mobility comments: self able with increased time using strapn    Transfers Overall transfer level: Needs assistance Equipment used: Rolling walker (2 wheels) Transfers: Sit to/from Stand Sit to Stand: Supervision           General transfer comment: pt self able to rise with no physical assistance    Ambulation/Gait Ambulation/Gait assistance: Supervision, Min guard Gait Distance (Feet): 25 Feet Assistive device: Rolling walker (2 wheels) Gait Pattern/deviations: Step-to pattern, Decreased stride length, Decreased weight shift to left Gait velocity: decreased     General Gait Details: amb with Spouse "hands on" using safety belt. Pt able to amb a functional distance with increased time.   Pt using her Apple watch to monitor her HR which was 74.   Stairs Stairs: Yes Stairs assistance: Min assist Stair Management: No rails, Step to pattern, Backwards, With walker   General stair comments: with Spouse "hands on" assisted pt up 2 steps backward with walker while securing her with safety belt and walker with his hip.   Wheelchair Mobility    Modified Rankin (Stroke Patients Only)       Balance  Cognition Arousal/Alertness: Awake/alert Behavior During Therapy: WFL for tasks assessed/performed Overall Cognitive Status: Within Functional Limits for tasks assessed                                  General Comments: AxO x 3 very pleasant, feeling apprehensive about going home.        Exercises  Total Knee Replacement TE's following HEP handout 10 reps B LE ankle pumps 05 reps towel squeezes 05 reps knee presses 05 reps heel slides  05 reps SLR's 05 reps ABD Educated on use of gait belt to assist with TE's Followed by ICE     General Comments        Pertinent Vitals/Pain Pain Assessment Pain Assessment: Faces Faces Pain Scale: Hurts even more Pain Location: L knee Pain Descriptors / Indicators: Discomfort, Operative site guarding Pain Intervention(s): Monitored during session, Premedicated before session, Repositioned, Ice applied    Home Living                          Prior Function            PT Goals (current goals can now be found in the care plan section) Progress towards PT goals: Progressing toward goals    Frequency    7X/week      PT Plan Current plan remains appropriate    Co-evaluation              AM-PAC PT "6 Clicks" Mobility   Outcome Measure  Help needed turning from your back to your side while in a flat bed without using bedrails?: None Help needed moving from lying on your back to sitting on the side of a flat bed without using bedrails?: None Help needed moving to and from a bed to a chair (including a wheelchair)?: None Help needed standing up from a chair using your arms (e.g., wheelchair or bedside chair)?: None Help needed to walk in hospital room?: None Help needed climbing 3-5 steps with a railing? : A Little 6 Click Score: 23    End of Session Equipment Utilized During Treatment: Gait belt Activity Tolerance: Patient tolerated treatment well Patient left: in chair;with call bell/phone within reach;with family/visitor present Nurse Communication: Mobility status PT Visit Diagnosis: Unsteadiness on feet (R26.81);Other abnormalities of gait and mobility (R26.89);Muscle weakness (generalized)  (M62.81);History of falling (Z91.81);Pain Pain - Right/Left: Left Pain - part of body: Knee     Time: 5621-3086 PT Time Calculation (min) (ACUTE ONLY): 32 min  Charges:  $Gait Training: 8-22 mins $Therapeutic Activity: 8-22 mins                     {Rosana Farnell  PTA Acute  [employer] M-F          [phone]

## 2023-04-17 NOTE — Discharge Instructions (Signed)

## 2023-04-17 NOTE — Progress Notes (Addendum)
Subjective: 3 Days Post-Op Procedure(s) (LRB): TOTAL KNEE ARTHROPLASTY (Left) Patient reports pain as 5 on 0-10 scale.  Overall, she did well last night with no concerning issues.  She is still somewhat apprehensive with regards to ambulating on her surgical leg.  Objective: Vital signs in last 24 hours: Temp:  [98.6 F (37 C)-99.1 F (37.3 C)] 98.7 F (37.1 C) (06/27 0538) Pulse Rate:  [92-107] 92 (06/27 0538) Resp:  [18] 18 (06/27 0538) BP: (125-162)/(62-67) 140/67 (06/27 0538) SpO2:  [94 %-97 %] 96 % (06/27 0538)  Intake/Output from previous day: 06/26 0701 - 06/27 0700 In: 515.7 [P.O.:480; I.V.:35.7] Out: 1000 [Urine:1000] Intake/Output this shift: No intake/output data recorded.  Recent Labs    04/15/23 0338 04/16/23 0344 04/17/23 0343  HGB 8.8* 7.8* 7.2*   Recent Labs    04/16/23 0344 04/17/23 0343  WBC 10.8* 9.8  RBC 2.54* 2.32*  HCT 24.2* 22.4*  PLT 254 265   No results for input(s): "NA", "K", "CL", "CO2", "BUN", "CREATININE", "GLUCOSE", "CALCIUM" in the last 72 hours. No results for input(s): "LABPT", "INR" in the last 72 hours.      Latest Ref Rng & Units 04/17/2023    3:43 AM 04/16/2023    3:44 AM 04/15/2023    3:38 AM  CBC  WBC 4.0 - 10.5 K/uL 9.8  10.8  9.4   Hemoglobin 12.0 - 15.0 g/dL 7.2  7.8  8.8   Hematocrit 36.0 - 46.0 % 22.4  24.2  27.7   Platelets 150 - 400 K/uL 265  254  256     Neurologically intact ABD soft Neurovascular intact Sensation intact distally Intact pulses distally Dorsiflexion/Plantar flexion intact Incision: dressing C/D/I and no drainage No cellulitis present Compartment soft   Assessment/Plan: 3 Days Post-Op Procedure(s) (LRB): TOTAL KNEE ARTHROPLASTY (Left) Advance diet Up with therapy Discharge home with home health, ideally today, but we continue to see reduction in her hemoglobin.  She also has a significant lack of confidence with regards to ambulating.  I will see how she does today with therapy but may  need to keep her an additional day given her downtrending hemoglobin and slow progression.  We may need to entertain placement in a SNF for short period. Anticipated LOS equal to or greater than 2 midnights due to - Obesity - Expected need for hospital services (PT, OT, Nursing) required for safe  discharge - Possible need for postoperative skilled nursing care or inpatient rehab - Active co-morbidities: Anemia - Unanticipated findings during/Post Surgery: Lab abnormalitiesf: Hemoglobin 7.2 this morning.   Shanon Payor [facility] 04/17/2023, 8:10 AM  ADDENDUM: Nursing staff from [facility] advised patient passed physical therapy with no adverse effects such as dizziness or increased heart rate.  I will go ahead and did her discharge paperwork ready.

## 2023-04-17 NOTE — Discharge Summary (Signed)
Patient ID: [name] MRN: [id number] DOB/AGE: 65-Feb-1957 65 y.o.  Admit date: 04/14/2023 Discharge date: 04/17/2023  Admission Diagnoses:  Principal Problem:   Status post total left knee replacement Active Problems:   Unilateral primary osteoarthritis, left knee   Status post total knee replacement, left   Discharge Diagnoses:  Same  Past Medical History:  Diagnosis Date   Allergy    Anemia    Aortic insufficiency    a. Mod by echo 05/2015.   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 Abnormal stress test -> LHc 06/2015 s/p DES to mCX and mLAD, residual D2 disease treated medially.   Diverticulitis    Erythema nodosum    Heart murmur    History of kidney stones    Hypertension    Insomnia    lung ca dx'd 10/1999   chemo/xrt comp 05/29/2000   Myalgia    Obesity    Pericardial effusion    a. 05/2015 Echo: EF 55-60%, no rwma, Gr 1 DD, no effusion - but an effusion was seen on CT which was felt to be increased in size compared to 12/2014 - pericardium also thickened.    Pericarditis    a. Dx 05/2015.   Pneumothorax, spontaneous, tension 01/20/2012   October, 2010   PONV (postoperative nausea and vomiting)    Pulmonary fibrosis (HCC) 01/20/2012   Due to previous surgery and chest radiation for lung cancer   Type II diabetes mellitus (HCC)     Surgeries: Procedure(s): TOTAL KNEE ARTHROPLASTY on 04/14/2023   Consultants:   Discharged Condition: Improved  Hospital Course: [name] is an 67 y.o. female who was admitted 04/14/2023 for operative treatment ofStatus post total left knee replacement. Patient has severe unremitting pain that affects sleep, daily activities, and work/hobbies. After pre-op clearance the patient was taken to the operating  room on 04/14/2023 and underwent  Procedure(s): TOTAL KNEE ARTHROPLASTY.    Patient was given perioperative antibiotics:  Anti-infectives (From admission, onward)    Start     Dose/Rate Route Frequency Ordered Stop   04/14/23 1400  ceFAZolin (ANCEF) IVPB 2g/100 mL premix        2 g 200 mL/hr over 30 Minutes Intravenous Every 6 hours 04/14/23 1224 04/15/23 0701   04/14/23 0600  ceFAZolin (ANCEF) IVPB 2g/100 mL premix        2 g 200 mL/hr over 30 Minutes Intravenous On call to O.R. 04/14/23 0533 04/14/23 0736        Patient was given sequential compression devices, early ambulation, and chemoprophylaxis to prevent DVT.  Patient benefited maximally from hospital stay and there were no complications.    Recent vital signs: Patient Vitals for the past 24 hrs:  BP Temp Temp src Pulse Resp SpO2  04/17/23 0538 (!) 140/67 98.7 F (37.1 C) Oral 92 18 96 %  04/16/23 2111 (!) 162/64 99.1 F (37.3 C) Oral (!) 107 18 97 %  04/16/23 1415 125/62 98.6 F (37 C) Oral 98 18 94 %     Recent laboratory studies:  Recent Labs    04/16/23 0344 04/17/23 0343  WBC 10.8* 9.8  HGB 7.8* 7.2*  HCT 24.2* 22.4*  PLT 254 265     Discharge Medications:  Allergies as of 04/17/2023       Reactions   Hydrocodone Hives, Itching   Morphine And Codeine Other (See Comments)   GI PROBLEMS   Crestor [rosuvastatin] Other (See Comments)   Pt reports causes lower extremity muscle aches/cramping   Invokana [canagliflozin] Diarrhea   Metformin Hcl Diarrhea   Lisinopril Cough   Nsaids Other (See Comments)   GI PROBLEMS        Medication List     TAKE these medications    Accu-Chek Guide test strip Generic drug: glucose blood   Accu-Chek Guide w/Device Kit See admin instructions.   Accu-Chek Softclix Lancets lancets USE LANCETS 3 TIMES A DAY AS DIRECTED 30 DAYS   acetaminophen 650 MG CR tablet Commonly known as: TYLENOL Take 1,300 mg by mouth every 8 (eight) hours as needed for pain  (headache).   B-D UF III MINI PEN NEEDLES 31G X 5 MM Misc Generic drug: Insulin Pen Needle 2 (two) times daily. as directed   co-enzyme Q-10 50 MG capsule TAKE 1 CAPSULE BY MOUTH EVERY DAY   diltiazem 360 MG 24 hr capsule Commonly known as: CARDIZEM CD TAKE 1 CAPSULE BY MOUTH EVERY DAY   docusate sodium 100 MG capsule Commonly known as: Colace Take 1 capsule (100 mg total) by mouth 2 (two) times daily as needed (for constipation).   Eliquis 5 MG Tabs tablet Generic drug: apixaban TAKE 1 TABLET BY MOUTH TWICE A DAY   furosemide 40 MG tablet Commonly known as: LASIX Take 1 tablet (40 mg total) by mouth daily as needed for fluid or edema.   levothyroxine 50 MCG tablet Commonly known as: SYNTHROID Take 50 mcg by mouth daily before breakfast.   losartan 25 MG tablet Commonly known as: COZAAR TAKE 1 TABLET BY MOUTH EVERY DAY   metoprolol succinate 50 MG 24 hr tablet Commonly known as: TOPROL-XL TAKE 1.5 TABLETS (75 MG TOTAL) BY MOUTH AT BEDTIME. TAKE WITH OR IMMEDIATELY FOLLOWING A MEAL.   Mounjaro 2.5 MG/0.5ML Pen Generic drug: tirzepatide Inject 2.5 mg into the skin once a week.   nitroGLYCERIN 0.4 MG SL tablet Commonly known as: NITROSTAT Place 0.4 mg under the tongue every 5 (five) minutes as needed for chest pain.   NovoLOG Mix 70/30 FlexPen (70-30) 100 UNIT/ML FlexPen Generic drug: insulin aspart protamine - aspart Inject 20 Units into the skin 2 (two) times daily with a meal.   oxyCODONE 5 MG immediate release tablet Commonly known as: Roxicodone Take 1-2 tablets (5-10 mg total) by mouth every 6 (six) hours as needed for severe pain.   pantoprazole 40 MG tablet Commonly known as: PROTONIX Take 1 tablet (40 mg total) by mouth daily before breakfast.   pravastatin 20 MG tablet Commonly known as: PRAVACHOL TAKE 1 TABLET BY MOUTH EVERY DAY   rosuvastatin 20 MG tablet Commonly known as: CRESTOR Take 20 mg by mouth daily.   spironolactone 25 MG  tablet Commonly known as: ALDACTONE TAKE 1 TABLET (25 MG TOTAL) BY MOUTH DAILY.   tiZANidine 4 MG tablet Commonly known as: Zanaflex Take 1 tablet (4 mg total) by mouth 3 (three) times daily as needed (for muscle spasm / pain).               Durable Medical Equipment  (From admission, onward)           Start     Ordered   04/17/23 1308  DME 3-in-1  Once        04/17/23 1312  04/14/23 1225  DME Walker rolling  Once       Question:  Patient needs a walker to treat with the following condition  Answer:  Primary osteoarthritis of left knee   04/14/23 1224   04/14/23 1225  DME 3 n 1  Once        04/14/23 1224            Diagnostic Studies: DG Chest 2 View  Result Date: 04/07/2023 CLINICAL DATA:  Preoperative chest exam. EXAM: CHEST - 2 VIEW COMPARISON:  Chest radiograph from 11/26/2021. FINDINGS: Redemonstration of postsurgical changes in the left upper hemithorax characterized by left apical homogeneous soft tissue, calcifications, metallic surgical staples, fibrosis/scarring and tenting of the left hemidiaphragm, etcetera. Bilateral lungs are otherwise clear. No acute consolidation or major atelectasis. Redemonstration of elevated left hemidiaphragm. Bilateral costophrenic angles are clear. Stable cardio-mediastinal silhouette. Coronary artery calcifications noted. No acute osseous abnormalities. Focal resection of lateral right clavicle again noted. The soft tissues are within normal limits. There are surgical clips in the right upper quadrant, typical of a previous cholecystectomy. IMPRESSION: No active cardiopulmonary disease. Multiple other stable chronic findings as described above. Electronically Signed   By: Jules Schick M.D.   On: 04/07/2023 09:57   MM 3D SCREENING MAMMOGRAM BILATERAL BREAST  Result Date: 03/24/2023 CLINICAL DATA:  Screening. EXAM: DIGITAL SCREENING BILATERAL MAMMOGRAM WITH TOMOSYNTHESIS AND CAD TECHNIQUE: Bilateral screening digital craniocaudal  and mediolateral oblique mammograms were obtained. Bilateral screening digital breast tomosynthesis was performed. The images were evaluated with computer-aided detection. COMPARISON:  Previous exam(s). ACR Breast Density Category b: There are scattered areas of fibroglandular density. FINDINGS: There are no findings suspicious for malignancy. IMPRESSION: No mammographic evidence of malignancy. A result letter of this screening mammogram will be mailed directly to the patient. RECOMMENDATION: Screening mammogram in one year. (Code:SM-B-01Y) BI-RADS CATEGORY  1: Negative. Electronically Signed   By: Baird Lyons M.D.   On: 03/24/2023 09:21    Disposition: Discharge disposition: 01-Home or Self Care       Discharge Instructions     CPM   Complete by: As directed    Call MD for:  difficulty breathing, headache or visual disturbances   Complete by: As directed    Call MD for:  persistant nausea and vomiting   Complete by: As directed    Call MD for:  severe uncontrolled pain   Complete by: As directed    Call MD for:  temperature >100.4   Complete by: As directed    Diet - low sodium heart healthy   Complete by: As directed    Discharge instructions   Complete by: As directed    INSTRUCTIONS AFTER JOINT REPLACEMENT   Remove items at home which could result in a fall. This includes throw rugs or furniture in walking pathways ICE to the affected joint every three hours while awake for 30 minutes at a time, for at least the first 3-5 days, and then as needed for pain and swelling.  Continue to use ice for pain and swelling. You may notice swelling that will progress down to the foot and ankle.  This is normal after surgery.  Elevate your leg when you are not up walking on it.   Continue to use the breathing machine you got in the hospital (incentive spirometer) which will help keep your temperature down.  It is common for your temperature to cycle up and down following surgery, especially at  night when you are not up  moving around and exerting yourself.  The breathing machine keeps your lungs expanded and your temperature down.   DIET:  As you were doing prior to hospitalization, we recommend a well-balanced diet.  DRESSING / WOUND CARE / SHOWERING  Keep the surgical dressing until follow up.  The dressing is water proof, so you can shower without any extra covering.  IF THE DRESSING FALLS OFF or the wound gets wet inside, change the dressing with sterile gauze.  Please use good hand washing techniques before changing the dressing.  Do not use any lotions or creams on the incision until instructed by your surgeon.    ACTIVITY  Increase activity slowly as tolerated, but follow the weight bearing instructions below.   No driving for 6 weeks or until further direction given by your physician.  You cannot drive while taking narcotics.  No lifting or carrying greater than 10 lbs. until further directed by your surgeon. Avoid periods of inactivity such as sitting longer than an hour when not asleep. This helps prevent blood clots.  You may return to work once you are authorized by your doctor.     WEIGHT BEARING   Weight bearing as tolerated with assist device (walker, cane, etc) as directed, use it as long as suggested by your surgeon or therapist, typically at least 4-6 weeks.   EXERCISES  Results after joint replacement surgery are often greatly improved when you follow the exercise, range of motion and muscle strengthening exercises prescribed by your doctor. Safety measures are also important to protect the joint from further injury. Any time any of these exercises cause you to have increased pain or swelling, decrease what you are doing until you are comfortable again and then slowly increase them. If you have problems or questions, call your caregiver or physical therapist for advice.   Rehabilitation is important following a joint replacement. After just a few days of  immobilization, the muscles of the leg can become weakened and shrink (atrophy).  These exercises are designed to build up the tone and strength of the thigh and leg muscles and to improve motion. Often times heat used for twenty to thirty minutes before working out will loosen up your tissues and help with improving the range of motion but do not use heat for the first two weeks following surgery (sometimes heat can increase post-operative swelling).   These exercises can be done on a training (exercise) mat, on the floor, on a table or on a bed. Use whatever works the best and is most comfortable for you.    Use music or television while you are exercising so that the exercises are a pleasant break in your day. This will make your life better with the exercises acting as a break in your routine that you can look forward to.   Perform all exercises about fifteen times, three times per day or as directed.  You should exercise both the operative leg and the other leg as well.  Exercises include:   Quad Sets - Tighten up the muscle on the front of the thigh (Quad) and hold for 5-10 seconds.   Straight Leg Raises - With your knee straight (if you were given a brace, keep it on), lift the leg to 60 degrees, hold for 3 seconds, and slowly lower the leg.  Perform this exercise against resistance later as your leg gets stronger.  Leg Slides: Lying on your back, slowly slide your foot toward your buttocks, bending your knee up  off the floor (only go as far as is comfortable). Then slowly slide your foot back down until your leg is flat on the floor again.  Angel Wings: Lying on your back spread your legs to the side as far apart as you can without causing discomfort.  Hamstring Strength:  Lying on your back, push your heel against the floor with your leg straight by tightening up the muscles of your buttocks.  Repeat, but this time bend your knee to a comfortable angle, and push your heel against the floor.  You  may put a pillow under the heel to make it more comfortable if necessary.   A rehabilitation program following joint replacement surgery can speed recovery and prevent re-injury in the future due to weakened muscles. Contact your doctor or a physical therapist for more information on knee rehabilitation.    CONSTIPATION  Constipation is defined medically as fewer than three stools per week and severe constipation as less than one stool per week.  Even if you have a regular bowel pattern at home, your normal regimen is likely to be disrupted due to multiple reasons following surgery.  Combination of anesthesia, postoperative narcotics, change in appetite and fluid intake all can affect your bowels.   YOU MUST use at least one of the following options; they are listed in order of increasing strength to get the job done.  They are all available over the counter, and you may need to use some, POSSIBLY even all of these options:    Drink plenty of fluids (prune juice may be helpful) and high fiber foods Colace 100 mg by mouth twice a day  Senokot for constipation as directed and as needed Dulcolax (bisacodyl), take with full glass of water  Miralax (polyethylene glycol) once or twice a day as needed.  If you have tried all these things and are unable to have a bowel movement in the first 3-4 days after surgery call either your surgeon or your primary doctor.    If you experience loose stools or diarrhea, hold the medications until you stool forms back up.  If your symptoms do not get better within 1 week or if they get worse, check with your doctor.  If you experience "the worst abdominal pain ever" or develop nausea or vomiting, please contact the office immediately for further recommendations for treatment.   ITCHING:  If you experience itching with your medications, try taking only a single pain pill, or even half a pain pill at a time.  You can also use Benadryl over the counter for itching or  also to help with sleep.   TED HOSE STOCKINGS:  Use stockings on both legs until for at least 2 weeks or as directed by physician office. They may be removed at night for sleeping.  MEDICATIONS:  See your medication summary on the "After Visit Summary" that nursing will review with you.  You may have some home medications which will be placed on hold until you complete the course of blood thinner medication.  It is important for you to complete the blood thinner medication as prescribed.  PRECAUTIONS:  If you experience chest pain or shortness of breath - call 911 immediately for transfer to the hospital emergency department.   If you develop a fever greater that 101 F, purulent drainage from wound, increased redness or drainage from wound, foul odor from the wound/dressing, or calf pain - CONTACT YOUR SURGEON.  FOLLOW-UP APPOINTMENTS:  If you do not already have a post-op appointment, please call the office for an appointment to be seen by your surgeon.  Guidelines for how soon to be seen are listed in your "After Visit Summary", but are typically between 1-4 weeks after surgery.  OTHER INSTRUCTIONS:   Knee Replacement:  Do not place pillow under knee, focus on keeping the knee straight while resting. CPM instructions: 0-90 degrees, 2 hours in the morning, 2 hours in the afternoon, and 2 hours in the evening. Place foam block, curve side up under heel at all times except when in CPM or when walking.  DO NOT modify, tear, cut, or change the foam block in any way.  POST-OPERATIVE OPIOID TAPER INSTRUCTIONS: It is important to wean off of your opioid medication as soon as possible. If you do not need pain medication after your surgery it is ok to stop day one. Opioids include: Codeine, Hydrocodone(Norco, Vicodin), Oxycodone(Percocet, oxycontin) and hydromorphone amongst others.  Long term and even short term use of opiods can cause: Increased pain  response Dependence Constipation Depression Respiratory depression And more.  Withdrawal symptoms can include Flu like symptoms Nausea, vomiting And more Techniques to manage these symptoms Hydrate well Eat regular healthy meals Stay active Use relaxation techniques(deep breathing, meditating, yoga) Do Not substitute Alcohol to help with tapering If you have been on opioids for less than two weeks and do not have pain than it is ok to stop all together.  Plan to wean off of opioids This plan should start within one week post op of your joint replacement. Maintain the same interval or time between taking each dose and first decrease the dose.  Cut the total daily intake of opioids by one tablet each day Next start to increase the time between doses. The last dose that should be eliminated is the evening dose.     MAKE SURE YOU:  Understand these instructions.  Get help right away if you are not doing well or get worse.    Thank you for letting us be a part of your medical care team.  It is a privilege we respect greatly.  We hope these instructions will help you stay on track for a fast and full recovery!   Driving Restrictions   Complete by: As directed    You should not drive a motor vehicle while you are taking narcotic pain medicines. You should not drive a motor vehicle until you feel safe to do so.   Increase activity slowly   Complete by: As directed         Follow-up Information     Jodi Geralds, MD Follow up.   Specialty: Orthopedic Surgery Why: Your postoperative follow-up appointment is scheduled for 04/29/2023 @ 09:45 Contact information: [address] [place] [address] [phone]                  Signed: Shanon Payor, PA-C [facility] 04/17/2023, 1:16 PM

## 2023-05-29 ENCOUNTER — Encounter (HOSPITAL_COMMUNITY): Payer: Self-pay | Admitting: Physician Assistant

## 2023-05-29 ENCOUNTER — Ambulatory Visit (HOSPITAL_COMMUNITY): Payer: BC Managed Care – PPO | Admitting: Physician Assistant

## 2023-05-29 ENCOUNTER — Ambulatory Visit (HOSPITAL_COMMUNITY)
Admission: RE | Admit: 2023-05-29 | Discharge: 2023-05-29 | Disposition: A | Discharge status: Home / Self Care | Payer: Medicare Other | Source: Ambulatory Visit | Attending: Physician Assistant | Admitting: Physician Assistant

## 2023-05-29 VITALS — BP 126/74 | HR 65 | Ht 67.0 in | Wt 214.0 lb

## 2023-05-29 DIAGNOSIS — J449 Chronic obstructive pulmonary disease, unspecified: Secondary | ICD-10-CM | POA: Diagnosis not present

## 2023-05-29 DIAGNOSIS — I2510 Atherosclerotic heart disease of native coronary artery without angina pectoris: Secondary | ICD-10-CM | POA: Diagnosis not present

## 2023-05-29 DIAGNOSIS — D6869 Other thrombophilia: Secondary | ICD-10-CM

## 2023-05-29 DIAGNOSIS — I480 Paroxysmal atrial fibrillation: Secondary | ICD-10-CM

## 2023-05-29 DIAGNOSIS — Z7901 Long term (current) use of anticoagulants: Secondary | ICD-10-CM | POA: Diagnosis not present

## 2023-05-29 DIAGNOSIS — E119 Type 2 diabetes mellitus without complications: Secondary | ICD-10-CM | POA: Insufficient documentation

## 2023-05-29 DIAGNOSIS — I110 Hypertensive heart disease with heart failure: Secondary | ICD-10-CM | POA: Diagnosis not present

## 2023-05-29 DIAGNOSIS — Z6833 Body mass index (BMI) 33.0-33.9, adult: Secondary | ICD-10-CM | POA: Diagnosis not present

## 2023-05-29 DIAGNOSIS — I5032 Chronic diastolic (congestive) heart failure: Secondary | ICD-10-CM | POA: Insufficient documentation

## 2023-05-29 DIAGNOSIS — E669 Obesity, unspecified: Secondary | ICD-10-CM | POA: Diagnosis not present

## 2023-05-29 DIAGNOSIS — Z955 Presence of coronary angioplasty implant and graft: Secondary | ICD-10-CM | POA: Insufficient documentation

## 2023-05-29 DIAGNOSIS — Z79899 Other long term (current) drug therapy: Secondary | ICD-10-CM | POA: Diagnosis not present

## 2023-05-29 NOTE — Progress Notes (Signed)
Primary Care Physician: Ollen Bowl, MD Primary Cardiologist: Dr Katrinka Blazing Primary Electrophysiologist: Dr Elberta Fortis Referring Physician: Dr Liana Gerold [name] is a 67 y.o. female with a history of paroxysmal atrial fibrillation, CAD s/p DES to [facility] and mLAD 07/11/15 (patent stents on repeat cath 11/2016), chronic pericardial effusion, h/o chronic pericarditis with chronic pericardial rub treated intermittently with steroids and colchicine, chronic diastolic CHF, DM, HTN, remote h/o treated non-small cell lung cancer, pulmonary fibrosis, aortic insufficiency, COPD and chronic anemia who presents for follow up in the [facility]. Patient was admitted for symptomatic atrial fibrillation with RVR 01/2019. She was started on Tikosyn during her admission. She denies snoring or significant alcohol use. Patient was seen in the ER on 01/23/20 with symptoms of heart racing and SOB. She was found to be in afib with RVR. She converted with IV metoprolol. She did get her second COVID vaccine on 01/19/20. There were no other specific triggers that the patient could identify.   Patient is s/p afib ablation with Dr Elberta Fortis on 02/27/22.   On follow up today, patient reports that she has done well since her last visit. No interim episodes of afib. No bleeding issues on anticoagulation. She did have TKR on her left knee on 04/14/23 and is working with PT.   Today, she denies symptoms of palpitations, chest pain, shortness of breath, orthopnea, PND, lower extremity edema, dizziness, presyncope, syncope, snoring, daytime somnolence, bleeding, or neurologic sequela. The patient is tolerating medications without difficulties and is otherwise without complaint today.    Atrial Fibrillation Risk Factors:  she does not have symptoms or diagnosis of sleep apnea. she does not have a history of rheumatic fever. she does not have a history of alcohol use. The patient does not have a history of  early familial atrial fibrillation or other arrhythmias.   Atrial Fibrillation Management history:  Previous antiarrhythmic drugs: dofetilide Previous cardioversions: none Previous ablations: 02/27/22 Anticoagulation history: Eliquis   Past Medical History:  Diagnosis Date   Allergy    Anemia    Aortic insufficiency    a. Mod by echo 05/2015.   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 Abnormal stress test -> [facility] 06/2015 s/p DES to mCX and mLAD, residual D2 disease treated medially.   Diverticulitis    Erythema nodosum    Heart murmur    History of kidney stones    Hypertension    Insomnia    lung ca dx'd 10/1999   chemo/xrt comp 05/29/2000   Myalgia    Obesity    Pericardial effusion    a. 05/2015 Echo: EF 55-60%, no rwma, Gr 1 DD, no effusion - but an effusion was seen on CT which was felt to be increased in size compared to 12/2014 - pericardium also thickened.    Pericarditis    a. Dx 05/2015.   Pneumothorax, spontaneous, tension 01/20/2012   October, 2010   PONV (postoperative nausea and vomiting)    Pulmonary fibrosis (HCC) 01/20/2012   Due to previous surgery and chest radiation for lung cancer   Type II diabetes mellitus (HCC)     Current Outpatient Medications  Medication Sig Dispense Refill   ACCU-CHEK GUIDE test strip  Accu-Chek Softclix Lancets lancets USE LANCETS 3 TIMES A DAY AS DIRECTED 30 DAYS     acetaminophen (TYLENOL) 650 MG CR tablet Take 1,300 mg by mouth every 8 (eight) hours as needed for pain (headache).     apixaban (ELIQUIS) 5 MG TABS tablet TAKE 1 TABLET BY MOUTH TWICE A DAY 180 tablet 1   B-D UF III MINI PEN NEEDLES 31G X 5 MM MISC 2 (two) times daily. as directed     Blood Glucose Monitoring Suppl (ACCU-CHEK GUIDE)  w/Device KIT See admin instructions.     co-enzyme Q-10 50 MG capsule TAKE 1 CAPSULE BY MOUTH EVERY DAY 30 capsule 7   diltiazem (CARDIZEM CD) 360 MG 24 hr capsule TAKE 1 CAPSULE BY MOUTH EVERY DAY 90 capsule 3   docusate sodium (COLACE) 100 MG capsule Take 1 capsule (100 mg total) by mouth 2 (two) times daily as needed (for constipation). 60 capsule 0   furosemide (LASIX) 40 MG tablet Take 1 tablet (40 mg total) by mouth daily as needed for fluid or edema. 30 tablet 6   levothyroxine (SYNTHROID, LEVOTHROID) 50 MCG tablet Take 50 mcg by mouth daily before breakfast.   6   losartan (COZAAR) 25 MG tablet TAKE 1 TABLET BY MOUTH EVERY DAY 90 tablet 3   metoprolol succinate (TOPROL-XL) 50 MG 24 hr tablet TAKE 1.5 TABLETS (75 MG TOTAL) BY MOUTH AT BEDTIME. TAKE WITH OR IMMEDIATELY FOLLOWING A MEAL. 135 tablet 2   MOUNJARO 2.5 MG/0.5ML Pen Inject 2.5 mg into the skin once a week.     nitroGLYCERIN (NITROSTAT) 0.4 MG SL tablet Place 0.4 mg under the tongue every 5 (five) minutes as needed for chest pain.     NOVOLOG MIX 70/30 FLEXPEN (70-30) 100 UNIT/ML FlexPen Inject 20 Units into the skin 2 (two) times daily with a meal.     oxyCODONE (ROXICODONE) 5 MG immediate release tablet Take 1-2 tablets (5-10 mg total) by mouth every 6 (six) hours as needed for severe pain. 40 tablet 0   pantoprazole (PROTONIX) 40 MG tablet Take 1 tablet (40 mg total) by mouth daily before breakfast. (Patient not taking: Reported on 03/31/2023) 30 tablet 4   pravastatin (PRAVACHOL) 20 MG tablet TAKE 1 TABLET BY MOUTH EVERY DAY (Patient not taking: Reported on 03/31/2023) 90 tablet 3   rosuvastatin (CRESTOR) 20 MG tablet Take 20 mg by mouth daily.     spironolactone (ALDACTONE) 25 MG tablet TAKE 1 TABLET (25 MG TOTAL) BY MOUTH DAILY. 90 tablet 3   tiZANidine (ZANAFLEX) 4 MG tablet Take 1 tablet (4 mg total) by mouth 3 (three) times daily as needed (for muscle spasm / pain). 60 tablet 0   No current facility-administered medications  for this encounter.    ROS- All systems are reviewed and negative except as per the HPI above.  Physical Exam: Vitals:   05/29/23 1353  Height: 5\' 7"  (1.702 m)     GEN: Well nourished, well developed in no acute distress NECK: No JVD; No carotid bruits CARDIAC: Regular rate and rhythm, no murmurs, rubs, gallops RESPIRATORY:  Clear to auscultation without rales, wheezing or rhonchi  ABDOMEN: Soft, non-tender, non-distended EXTREMITIES:  No edema; No deformity    Wt Readings from Last 3 Encounters:  04/14/23 98.4 kg  04/01/23 98.4 kg  01/03/23 100.8 kg    EKG today demonstrates  SR Vent. rate 65 BPM PR interval 116 ms QRS duration 98 ms QT/QTcB 412/428 ms  Echo 07/18/22 demonstrated  1. Compared  to 11/26/21 pericardial effusion is larger (previously appeared  trivial; now small).   2. Left ventricular ejection fraction, by estimation, is 60 to 65%. The  left ventricle has normal function. The left ventricle has no regional  wall motion abnormalities. Left ventricular diastolic parameters are  consistent with Grade II diastolic  dysfunction (pseudonormalization).   3. Right ventricular systolic function is normal. The right ventricular  size is normal.   4. A small pericardial effusion is present.   5. The mitral valve is normal in structure. No evidence of mitral valve  regurgitation. No evidence of mitral stenosis.   6. The aortic valve is tricuspid. Aortic valve regurgitation is mild to  moderate. No aortic stenosis is present.   7. Aortic dilatation noted. There is borderline dilatation of the  ascending aorta, measuring 38 mm.   8. The inferior vena cava is normal in size with greater than 50%  respiratory variability, suggesting right atrial pressure of 3 mmHg.   Epic records are reviewed at length today   CHA2DS2-VASc Score = 5  The patient's score is based upon: CHF History: 1 HTN History: 1 Diabetes History: 1 Stroke History: 0 Vascular Disease  History: 1 Age Score: 0 Gender Score: 1       ASSESSMENT AND PLAN: Paroxysmal Atrial Fibrillation (ICD10:  I48.0) The patient's CHA2DS2-VASc score is 5, indicating a 7.2% annual risk of stroke.   S/p afib ablation 02/27/22, now off dofetilide  Patient appears to be maintaining SR Continue diltiazem 360 mg daily  Continue Toprol 75 mg daily Continue Eliquis 5 mg BID  Apple Watch for home monitoring   Secondary Hypercoagulable State (ICD10:  D68.69) The patient is at significant risk for stroke/thromboembolism based upon her CHA2DS2-VASc Score of 5.  Continue Apixaban (Eliquis).   Obesity Body mass index is 33.99 kg/m.  Encouraged lifestyle modification  CAD CAC score 2774 99th percentile.  Stress test 02/20/22 low risk. No anginal symptoms  HTN Stable on current regimen  Chronic diastolic CHF Fluid status appears stable today   Follow up with Dr Elberta Fortis in 6 months.    Jorja Loa PA-C [facility] [address] [place], [place] [address] [phone] 05/29/2023 2:00 PM

## 2023-06-21 ENCOUNTER — Emergency Department (HOSPITAL_COMMUNITY)
Admission: EM | Admit: 2023-06-21 | Discharge: 2023-06-22 | Disposition: A | Discharge status: Home / Self Care | Payer: Medicare Other | Attending: Emergency Medicine | Admitting: Emergency Medicine

## 2023-06-21 ENCOUNTER — Encounter (HOSPITAL_COMMUNITY): Payer: Self-pay | Admitting: *Deleted

## 2023-06-21 ENCOUNTER — Other Ambulatory Visit: Payer: Self-pay

## 2023-06-21 DIAGNOSIS — E119 Type 2 diabetes mellitus without complications: Secondary | ICD-10-CM | POA: Diagnosis not present

## 2023-06-21 DIAGNOSIS — Z79899 Other long term (current) drug therapy: Secondary | ICD-10-CM | POA: Insufficient documentation

## 2023-06-21 DIAGNOSIS — N3000 Acute cystitis without hematuria: Secondary | ICD-10-CM | POA: Insufficient documentation

## 2023-06-21 DIAGNOSIS — R1030 Lower abdominal pain, unspecified: Secondary | ICD-10-CM | POA: Diagnosis present

## 2023-06-21 DIAGNOSIS — I4891 Unspecified atrial fibrillation: Secondary | ICD-10-CM | POA: Insufficient documentation

## 2023-06-21 DIAGNOSIS — Z794 Long term (current) use of insulin: Secondary | ICD-10-CM | POA: Insufficient documentation

## 2023-06-21 DIAGNOSIS — Z7901 Long term (current) use of anticoagulants: Secondary | ICD-10-CM | POA: Diagnosis not present

## 2023-06-21 DIAGNOSIS — I10 Essential (primary) hypertension: Secondary | ICD-10-CM | POA: Diagnosis not present

## 2023-06-21 LAB — URINALYSIS, MICROSCOPIC (REFLEX)

## 2023-06-21 LAB — COMPREHENSIVE METABOLIC PANEL
ALT: 9 U/L (ref 0–44)
AST: 13 U/L — ABNORMAL LOW (ref 15–41)
Albumin: 3.3 g/dL — ABNORMAL LOW (ref 3.5–5.0)
Alkaline Phosphatase: 102 U/L (ref 38–126)
Anion gap: 10 (ref 5–15)
BUN: 17 mg/dL (ref 8–23)
CO2: 28 mmol/L (ref 22–32)
Calcium: 9 mg/dL (ref 8.9–10.3)
Chloride: 103 mmol/L (ref 98–111)
Creatinine, Ser: 0.9 mg/dL (ref 0.44–1.00)
GFR, Estimated: >60 mL/min (ref >60)
Glucose, Bld: 160 mg/dL — ABNORMAL HIGH (ref 70–99)
Potassium: 4.6 mmol/L (ref 3.5–5.1)
Sodium: 141 mmol/L (ref 135–145)
Total Bilirubin: 0.6 mg/dL (ref 0.3–1.2)
Total Protein: 6.9 g/dL (ref 6.5–8.1)

## 2023-06-21 LAB — URINALYSIS, ROUTINE W REFLEX MICROSCOPIC
Bilirubin Urine: NEGATIVE
Glucose, UA: NEGATIVE mg/dL
Ketones, ur: NEGATIVE mg/dL
Leukocytes,Ua: NEGATIVE
Nitrite: POSITIVE — AB
Protein, ur: NEGATIVE mg/dL
Specific Gravity, Urine: >1.03 — ABNORMAL HIGH (ref 1.005–1.030)
pH: 6 (ref 5.0–8.0)

## 2023-06-21 LAB — CBC
HCT: 35.3 % — ABNORMAL LOW (ref 36.0–46.0)
Hemoglobin: 11.2 g/dL — ABNORMAL LOW (ref 12.0–15.0)
MCH: 30.1 pg (ref 26.0–34.0)
MCHC: 31.7 g/dL (ref 30.0–36.0)
MCV: 94.9 fL (ref 80.0–100.0)
Platelets: 290 10*3/uL (ref 150–400)
RBC: 3.72 MIL/uL — ABNORMAL LOW (ref 3.87–5.11)
RDW: 12.3 % (ref 11.5–15.5)
WBC: 5.8 10*3/uL (ref 4.0–10.5)
nRBC: 0 % (ref 0.0–0.2)

## 2023-06-21 LAB — LIPASE, BLOOD: Lipase: 32 U/L (ref 11–51)

## 2023-06-21 NOTE — ED Triage Notes (Signed)
The pt has had some lower abd pain  since 100am today hx of kidney stones    she took a muscle relaxer and an oxycodone around 1400 with no relief

## 2023-06-22 ENCOUNTER — Emergency Department (HOSPITAL_COMMUNITY): Payer: Medicare Other

## 2023-06-22 DIAGNOSIS — N3000 Acute cystitis without hematuria: Secondary | ICD-10-CM | POA: Diagnosis not present

## 2023-06-22 MED ORDER — FENTANYL CITRATE PF 50 MCG/ML IJ SOSY
50.0000 ug | PREFILLED_SYRINGE | Freq: Once | INTRAMUSCULAR | Status: AC
  Administered 2023-06-22: 50 ug via INTRAVENOUS
  Filled 2023-06-22: qty 1

## 2023-06-22 MED ORDER — CYCLOBENZAPRINE HCL 10 MG PO TABS: 10.0000 mg | ORAL_TABLET | Freq: Two times a day (BID) | ORAL | 0 refills | Status: DC | PRN

## 2023-06-22 MED ORDER — SODIUM CHLORIDE 0.9 % IV BOLUS
1000.0000 mL | Freq: Once | INTRAVENOUS | Status: AC
  Administered 2023-06-22: 1000 mL via INTRAVENOUS

## 2023-06-22 MED ORDER — DIAZEPAM 5 MG/ML IJ SOLN
1.5000 mg | Freq: Once | INTRAMUSCULAR | Status: AC
  Administered 2023-06-22: 1.5 mg via INTRAVENOUS
  Filled 2023-06-22: qty 2

## 2023-06-22 MED ORDER — IOHEXOL 350 MG/ML SOLN
100.0000 mL | Freq: Once | INTRAVENOUS | Status: AC | PRN
  Administered 2023-06-22: 100 mL via INTRAVENOUS

## 2023-06-22 MED ORDER — ONDANSETRON HCL 4 MG/2ML IJ SOLN
4.0000 mg | Freq: Once | INTRAMUSCULAR | Status: AC
  Administered 2023-06-22: 4 mg via INTRAVENOUS
  Filled 2023-06-22: qty 2

## 2023-06-22 MED ORDER — CEPHALEXIN 250 MG PO CAPS
500.0000 mg | ORAL_CAPSULE | Freq: Once | ORAL | Status: AC
  Administered 2023-06-22: 500 mg via ORAL
  Filled 2023-06-22: qty 2

## 2023-06-22 MED ORDER — CEPHALEXIN 500 MG PO CAPS: 500.0000 mg | ORAL_CAPSULE | Freq: Two times a day (BID) | ORAL | 0 refills | Status: AC

## 2023-06-22 NOTE — Discharge Instructions (Signed)
Suspect of UTI have started you on antibiotics take as prescribed, may control pain with ibuprofen or Tylenol, recommend staying hydrated.  I have given you a muscle relaxer take as prescribed can make you drowsy do not consume alcohol or operate heavy machinery and take this medication.  Please follow-up with your primary doctor in a week's time for reassessment.  Come back to the emergency department if you develop chest pain, shortness of breath, severe abdominal pain, uncontrolled nausea, vomiting, diarrhea.

## 2023-06-22 NOTE — ED Provider Notes (Signed)
EMERGENCY DEPARTMENT AT [facility] Provider Note   CSN: [id number] Arrival date & time: 06/21/23  1753     History  Chief Complaint  Patient presents with   Abdominal Pain    [name] is a 67 y.o. female.  HPI   Patient with medical history including pulmonary fibrosis, type 2 diabetes, diverticulitis, pericarditis, atrial fibrillation on Eliquis, hypertension, presenting with complaints of suprapubic/left lower quadrant tenderness, started on 11 AM this morning, states pain is constant, associated nausea and vomiting, no difficult bowel movements, still passing gas, denies any bloody stools, no urinary symptoms, noted no dysuria hematuria, she has had no abdominal surgeries before.  She has no associate fevers chills cough congestion no body aches, no recent travels no new foods.  Home Medications Prior to Admission medications   Medication Sig Start Date End Date Taking? Authorizing Provider  cephALEXin (KEFLEX) 500 MG capsule Take 1 capsule (500 mg total) by mouth 2 (two) times daily for 7 days. 06/22/23 06/29/23 Yes Carroll Sage, PA-C  cyclobenzaprine (FLEXERIL) 10 MG tablet Take 1 tablet (10 mg total) by mouth 2 (two) times daily as needed for muscle spasms. 06/22/23  Yes Carroll Sage, PA-C  ACCU-CHEK GUIDE test strip  05/23/22   [provider]  Accu-Chek Softclix Lancets lancets USE LANCETS 3 TIMES A DAY AS DIRECTED 30 DAYS 11/12/19   [provider]  acetaminophen (TYLENOL) 650 MG CR tablet Take 1,300 mg by mouth every 8 (eight) hours as needed for pain (headache).    [provider]  apixaban (ELIQUIS) 5 MG TABS tablet TAKE 1 TABLET BY MOUTH TWICE A DAY 03/12/23   Camnitz, Andree Coss, MD  B-D UF III MINI PEN NEEDLES 31G X 5 MM MISC 2 (two) times daily. as directed 10/31/18   [provider]  Blood Glucose Monitoring Suppl (ACCU-CHEK GUIDE) w/Device KIT See admin instructions. 11/12/19   [provider]   co-enzyme Q-10 50 MG capsule TAKE 1 CAPSULE BY MOUTH EVERY DAY 11/30/19   Lars Masson, MD  diltiazem (CARDIZEM CD) 360 MG 24 hr capsule TAKE 1 CAPSULE BY MOUTH EVERY DAY 07/19/22   Camnitz, Andree Coss, MD  docusate sodium (COLACE) 100 MG capsule Take 1 capsule (100 mg total) by mouth 2 (two) times daily as needed (for constipation). 04/17/23 04/16/24  Shanon Payor, PA-C  furosemide (LASIX) 40 MG tablet Take 1 tablet (40 mg total) by mouth daily as needed for fluid or edema. 03/15/21   Lyn Records, MD  levothyroxine (SYNTHROID, LEVOTHROID) 50 MCG tablet Take 50 mcg by mouth daily before breakfast.  05/14/17   [provider]  losartan (COZAAR) 25 MG tablet TAKE 1 TABLET BY MOUTH EVERY DAY 11/20/21   Lyn Records, MD  metoprolol succinate (TOPROL-XL) 50 MG 24 hr tablet TAKE 1.5 TABLETS (75 MG TOTAL) BY MOUTH AT BEDTIME. TAKE WITH OR IMMEDIATELY FOLLOWING A MEAL. 11/20/22   Camnitz, Andree Coss, MD  MOUNJARO 2.5 MG/0.5ML Pen Inject 2.5 mg into the skin once a week. 03/06/23   [provider]  nitroGLYCERIN (NITROSTAT) 0.4 MG SL tablet Place 0.4 mg under the tongue every 5 (five) minutes as needed for chest pain.    [provider]  NOVOLOG MIX 70/30 FLEXPEN (70-30) 100 UNIT/ML FlexPen Inject 20 Units into the skin 2 (two) times daily with a meal. 04/22/22   [provider]  oxyCODONE (ROXICODONE) 5 MG immediate release tablet Take 1-2 tablets (5-10 mg  total) by mouth every 6 (six) hours as needed for severe pain. 04/17/23   Shanon Payor, PA-C  pantoprazole (PROTONIX) 40 MG tablet Take 1 tablet (40 mg total) by mouth daily before breakfast. 08/06/22   Esterwood, Amy S, PA-C  pravastatin (PRAVACHOL) 20 MG tablet TAKE 1 TABLET BY MOUTH EVERY DAY Patient not taking: Reported on 05/29/2023 11/20/21   Lyn Records, MD  rosuvastatin (CRESTOR) 20 MG tablet Take 20 mg by mouth daily. 03/06/23   [provider]  spironolactone (ALDACTONE) 25 MG tablet TAKE 1  TABLET (25 MG TOTAL) BY MOUTH DAILY. 07/23/22   Graciella Freer, PA-C  tiZANidine (ZANAFLEX) 4 MG tablet Take 1 tablet (4 mg total) by mouth 3 (three) times daily as needed (for muscle spasm / pain). 04/17/23 04/16/24  Shanon Payor, PA-C      Allergies    Hydrocodone, Morphine and codeine, Crestor [rosuvastatin], Invokana [canagliflozin], Metformin hcl, Lisinopril, and Nsaids    Review of Systems   Review of Systems  Constitutional:  Negative for chills and fever.  Respiratory:  Negative for shortness of breath.   Cardiovascular:  Negative for chest pain.  Gastrointestinal:  Positive for abdominal pain and nausea. Negative for vomiting.  Neurological:  Negative for headaches.    Physical Exam Updated Vital Signs BP (!) 141/53 (BP Location: Right Arm)   Pulse 95   Temp 97.7 F (36.5 C)   Resp 18   Ht 5\' 7"  (1.702 m)   Wt 97.1 kg   SpO2 98%   BMI 33.53 kg/m  Physical Exam Vitals and nursing note reviewed.  Constitutional:      General: She is not in acute distress.    Appearance: She is not ill-appearing.  HENT:     Head: Normocephalic and atraumatic.     Nose: No congestion.  Eyes:     Conjunctiva/sclera: Conjunctivae normal.  Cardiovascular:     Rate and Rhythm: Normal rate and regular rhythm.     Pulses: Normal pulses.     Heart sounds: No murmur heard.    No friction rub. No gallop.  Pulmonary:     Effort: No respiratory distress.     Breath sounds: No wheezing, rhonchi or rales.  Abdominal:     Palpations: Abdomen is soft.     Tenderness: There is abdominal tenderness. There is no right CVA tenderness or left CVA tenderness.     Comments: Abdomen is nondistended, soft, noted tenderness in the suprapubic/left lower quadrant, no peritoneal sign, no rebound tenderness, she has no flank tenderness nor CVA tenderness.  Skin:    General: Skin is warm and dry.  Neurological:     Mental Status: She is alert.  Psychiatric:        Mood and Affect: Mood normal.      ED Results / Procedures / Treatments   Labs (all labs ordered are listed, but only abnormal results are displayed) Labs Reviewed  COMPREHENSIVE METABOLIC PANEL - Abnormal; Notable for the following components:      Result Value   Glucose, Bld 160 (*)    Albumin 3.3 (*)    AST 13 (*)    All other components within normal limits  CBC - Abnormal; Notable for the following components:   RBC 3.72 (*)    Hemoglobin 11.2 (*)    HCT 35.3 (*)    All other components within normal limits  URINALYSIS, ROUTINE W REFLEX MICROSCOPIC - Abnormal; Notable for the following components:  Specific Gravity, Urine >1.030 (*)    Hgb urine dipstick TRACE (*)    Nitrite POSITIVE (*)    All other components within normal limits  URINALYSIS, MICROSCOPIC (REFLEX) - Abnormal; Notable for the following components:   Bacteria, UA MANY (*)    All other components within normal limits  LIPASE, BLOOD    EKG None  Radiology CT ABDOMEN PELVIS W CONTRAST  Result Date: 06/22/2023 CLINICAL DATA:  Left lower quadrant pain EXAM: CT ABDOMEN AND PELVIS WITH CONTRAST TECHNIQUE: Multidetector CT imaging of the abdomen and pelvis was performed using the standard protocol following bolus administration of intravenous contrast. RADIATION DOSE REDUCTION: This exam was performed according to the departmental dose-optimization program which includes automated exposure control, adjustment of the mA and/or kV according to patient size and/or use of iterative reconstruction technique. CONTRAST:  OMNIPAQUE IOHEXOL 350 MG/ML SOLN COMPARISON:  09/01/2018 FINDINGS: Lower chest: Fuse calcifications in the left coronary arteries. No acute findings. Hepatobiliary: No focal liver abnormality is seen. Status post cholecystectomy. No biliary dilatation. Pancreas: No focal abnormality or ductal dilatation. Spleen: No focal abnormality.  Normal size. Adrenals/Urinary Tract: No adrenal abnormality. No focal renal abnormality. No stones  or hydronephrosis. Urinary bladder is unremarkable. Stomach/Bowel: Diffuse colonic diverticulosis. No active diverticulitis. Stomach and small bowel decompressed, unremarkable. Vascular/Lymphatic: Aortic atherosclerosis. No evidence of aneurysm or adenopathy. Reproductive: Prior hysterectomy.  No adnexal masses. Other: No free fluid or free air. Musculoskeletal: No acute bony abnormality. IMPRESSION: Diffuse colonic diverticulosis, most pronounced in the left colon. No active diverticulitis. No acute findings. Coronary artery disease, aortic atherosclerosis. Electronically Signed   By: Charlett Nose M.D.   On: 06/22/2023 02:09    Procedures Procedures    Medications Ordered in ED Medications  diazepam (VALIUM) injection 1.5 mg (has no administration in time range)  cephALEXin (KEFLEX) capsule 500 mg (has no administration in time range)  fentaNYL (SUBLIMAZE) injection 50 mcg (50 mcg Intravenous Given 06/22/23 0122)  ondansetron (ZOFRAN) injection 4 mg (4 mg Intravenous Given 06/22/23 0122)  sodium chloride 0.9 % bolus 1,000 mL (1,000 mLs Intravenous New Bag/Given 06/22/23 0125)  iohexol (OMNIPAQUE) 350 MG/ML injection 100 mL (100 mLs Intravenous Contrast Given 06/22/23 0155)    ED Course/ Medical Decision Making/ A&P                                 Medical Decision Making Amount and/or Complexity of Data Reviewed Radiology: ordered.  Risk Prescription drug management.   This patient presents to the ED for concern of abdominal pain, this involves an extensive number of treatment options, and is a complaint that carries with it a high risk of complications and morbidity.  The differential diagnosis includes diverticulitis, kidney stone, UTI, Pilo, AAA, dissection,    Additional history obtained:  Additional history obtained from N/A External records from outside source obtained and reviewed including cardiology notes   Co morbidities that complicate the patient evaluation  A-fib  currently on Eliquis,  Social Determinants of Health:  Geriatric    Lab Tests:  I Ordered, and personally interpreted labs.  The pertinent results include: CBC shows normocytic anemia hemoglobin 9.2, CMP reveals glucose 160, albumin 3.3, AST 13, lipase 32, UA shows nitrates, many bacteria   Imaging Studies ordered:  I ordered imaging studies including CT abdomen pelvis I independently visualized and interpreted imaging which showed negative for acute findings I agree with the radiologist interpretation  Cardiac Monitoring:  The patient was maintained on a cardiac monitor.  I personally viewed and interpreted the cardiac monitored which showed an underlying rhythm of: N/A   Medicines ordered and prescription drug management:  I ordered medication including intravenous fluids, antiemetics, analgesics I have reviewed the patients home medicines and have made adjustments as needed  Critical Interventions:  N/A   Reevaluation:  Presenting with abdominal pain, triage obtain basic lab workup which I personally reviewed, my exam is consistent with likely UTI but she does have left lower quadrant tenderness has a history of diverticulitis, cannot exclude this, will set up for CT imaging for further evaluation.  Patient was reassessed resting comfortably agreement discharge at this time   Consultations Obtained:  N/a    Test Considered:  N/a    Rule out Suspicion for lower lobe pneumonia at this time lung sounds are clear bilaterally, no endorsing any URI-like symptoms.  I doubt PE presentation atypical, not endors chest pain shortness of breath denies any pleuritic chest pain, she is not tachypneic not hypoxic.  Suspicion for AAA or dissection is also low at this time she has low risk factors, presentation is more consistent with likely UTI.  Suspicion for Pilo, kidney stone, diverticulitis, volvulus, bowel obstruction, intra-abdominal mass or infection CT imaging is  negative these findings.    Dispostion and problem list  After consideration of the diagnostic results and the patients response to treatment, I feel that the patent would benefit from discharge.  UTI-urine cultures are pending, will start antibiotics, have her follow-up with primary doctor for further assessment given her strict return precautions. Muscular pain-patient does have some pain in her groin region, worsened with movement, suspect muscular strain, will provide with her muscle relaxer can follow-up with her primary doctor for reassessment.            Final Clinical Impression(s) / ED Diagnoses Final diagnoses:  Acute cystitis without hematuria    Rx / DC Orders ED Discharge Orders          Ordered    cyclobenzaprine (FLEXERIL) 10 MG tablet  2 times daily PRN        06/22/23 0257    cephALEXin (KEFLEX) 500 MG capsule  2 times daily        06/22/23 0257              Carroll Sage, PA-C 06/22/23 0258    Nira Conn, MD 06/22/23 1758

## 2023-06-25 LAB — URINE CULTURE: Culture: >=100000 — AB

## 2023-06-26 ENCOUNTER — Telehealth (HOSPITAL_BASED_OUTPATIENT_CLINIC_OR_DEPARTMENT_OTHER): Payer: Self-pay | Admitting: *Deleted

## 2023-06-26 NOTE — Telephone Encounter (Signed)
Post ED Visit - Positive Culture Follow-up: Unsuccessful Patient Follow-up  Culture assessed and recommendations reviewed by:  [x]  Atomwen Adesuwa, Pharm.D. []  Celedonio Miyamoto, Pharm.D., BCPS AQ-ID []  Garvin Fila, Pharm.D., BCPS []  Georgina Pillion, Pharm.D., BCPS []  [place], [address].D., BCPS, AAHIVP []  Estella Husk, Pharm.D., BCPS, AAHIVP []  Sherlynn Carbon, PharmD []  Pollyann Samples, PharmD, BCPS  Positive urine culture  []  Patient discharged without antimicrobial prescription and treatment is now indicated [x]  Organism is resistant to prescribed ED discharge antimicrobial []  Patient with positive blood cultures Stop Cephalexin , If patient develops urinary signs and symptoms, start Nitrofurantoin 100 mg BID for 5 days. Christopher Tegeler    Unable to contact patient after 3 attempts, letter will be sent to address on file  Bing Quarry 06/26/2023, 11:09 AM

## 2023-06-26 NOTE — Telephone Encounter (Signed)
Post ED Visit - Positive Culture Follow-up  Culture report reviewed by antimicrobial stewardship pharmacist: [facility] []  Enzo Bi, Pharm.D. [x]  Celedonio Miyamoto, Pharm.D., BCPS AQ-ID []  Garvin Fila, Pharm.D., BCPS []  Georgina Pillion, Pharm.D., BCPS []  [place], [address].D., BCPS, AAHIVP []  Estella Husk, Pharm.D., BCPS, AAHIVP []  Lysle Pearl, PharmD, BCPS []  Phillips Climes, PharmD, BCPS []  Agapito Games, PharmD, BCPS []  Verlan Friends, PharmD []  Mervyn Gay, PharmD, BCPS []  Vinnie Level, PharmD  [facility] []  Len Childs, PharmD []  Greer Pickerel, PharmD []  Adalberto Cole, PharmD []  Perlie Gold, Rph []  Lonell Face) Jean Rosenthal, PharmD []  Earl Many, PharmD []  Junita Push, PharmD []  Dorna Leitz, PharmD []  Terrilee Files, PharmD []  Lynann Beaver, PharmD []  Keturah Barre, PharmD []  Loralee Pacas, PharmD []  Bernadene Person, PharmD   Positive Urine culture Spoke to patient and she stated she was feeling worst so she went to her PCP and was started pm Ciprofloxacin 500 mg Metronidazole 500 mg and dicyclomine 20 mg and now starting to improving.  Organism sensitive to the same and no further patient follow-up is required at this time.   Nena Polio Garner Nash 06/26/2023, 11:59 AM

## 2023-06-26 NOTE — Progress Notes (Addendum)
ED Antimicrobial Stewardship Positive Culture Follow Up   [name] is an 67 y.o. female who presented to [facility] on 06/21/2023 with a chief complaint of  Chief Complaint  Patient presents with   Abdominal Pain    Recent Results (from the past 720 hour(s))  Urine Culture     Status: Abnormal   Collection Time: 06/21/23  6:25 PM   Specimen: Urine, Clean Catch  Result Value Ref Range Status   Specimen Description URINE, CLEAN CATCH  Final   Special Requests   Final    NONE Performed at [facility], [address] [place], [place] [address]    Culture >=100,000 COLONIES/mL KLEBSIELLA AEROGENES (A)  Final   Report Status 06/25/2023 FINAL  Final   Organism ID, Bacteria KLEBSIELLA AEROGENES (A)  Final      Susceptibility   Klebsiella aerogenes - MIC*    CEFEPIME <=0.12 SENSITIVE Sensitive     CEFTRIAXONE <=0.25 SENSITIVE Sensitive     CIPROFLOXACIN <=0.25 SENSITIVE Sensitive     GENTAMICIN <=1 SENSITIVE Sensitive     IMIPENEM 2 SENSITIVE Sensitive     NITROFURANTOIN 32 SENSITIVE Sensitive     TRIMETH/SULFA <=20 SENSITIVE Sensitive     PIP/TAZO <=4 SENSITIVE Sensitive     * >=100,000 COLONIES/mL KLEBSIELLA AEROGENES    [x]  Treated with Cephalexin, organism resistant to prescribed antimicrobial   Plan: Stop Cephalexin. If the patient develops urinary symptoms, start Nitrofurantoin 100mg  BID for 5 days.  ED Provider: Lynden Oxford, MD   [name] 06/26/2023, 8:25 AM Clinical Pharmacist Monday - Friday phone -  [phone] Saturday - Sunday phone - [phone]

## 2023-07-14 ENCOUNTER — Ambulatory Visit (HOSPITAL_COMMUNITY): Admit: 2023-07-14 | Payer: Medicare Other

## 2023-07-15 ENCOUNTER — Telehealth: Payer: Self-pay | Admitting: Adult Health

## 2023-07-15 ENCOUNTER — Other Ambulatory Visit: Payer: Self-pay | Admitting: Adult Health

## 2023-07-15 DIAGNOSIS — R042 Hemoptysis: Secondary | ICD-10-CM

## 2023-07-15 NOTE — Telephone Encounter (Signed)
Pt calling because she missed a CT appt yesterday and needs to resched. Pls call @ [phone]

## 2023-07-15 NOTE — Telephone Encounter (Signed)
Patient aware of new appt date and time.

## 2023-07-24 ENCOUNTER — Ambulatory Visit (HOSPITAL_COMMUNITY)
Admission: RE | Admit: 2023-07-24 | Discharge: 2023-07-24 | Disposition: A | Discharge status: Home / Self Care | Payer: Medicare Other | Source: Ambulatory Visit | Attending: Internal Medicine | Admitting: Internal Medicine

## 2023-07-24 DIAGNOSIS — R042 Hemoptysis: Secondary | ICD-10-CM | POA: Diagnosis present

## 2023-08-07 NOTE — Progress Notes (Signed)
Called and spoke with patient, advised of results/recommendations per Rubye Oaks NP.  She has a f/u with Dr. Vassie Loll on 10/06/2023.  She verbalized understanding.  Nothing further needed.

## 2023-08-21 ENCOUNTER — Other Ambulatory Visit: Payer: Self-pay | Admitting: Internal Medicine

## 2023-08-21 DIAGNOSIS — E2839 Other primary ovarian failure: Secondary | ICD-10-CM

## 2023-09-03 ENCOUNTER — Ambulatory Visit: Payer: Medicare Other | Attending: Internal Medicine | Admitting: Internal Medicine

## 2023-09-03 ENCOUNTER — Encounter: Payer: Self-pay | Admitting: Internal Medicine

## 2023-09-03 VITALS — BP 120/68 | HR 68 | Ht 67.0 in | Wt 220.0 lb

## 2023-09-03 DIAGNOSIS — I5032 Chronic diastolic (congestive) heart failure: Secondary | ICD-10-CM

## 2023-09-03 DIAGNOSIS — G720 Drug-induced myopathy: Secondary | ICD-10-CM

## 2023-09-03 DIAGNOSIS — D6869 Other thrombophilia: Secondary | ICD-10-CM

## 2023-09-03 DIAGNOSIS — I10 Essential (primary) hypertension: Secondary | ICD-10-CM | POA: Diagnosis not present

## 2023-09-03 DIAGNOSIS — Z9861 Coronary angioplasty status: Secondary | ICD-10-CM

## 2023-09-03 DIAGNOSIS — G4733 Obstructive sleep apnea (adult) (pediatric): Secondary | ICD-10-CM

## 2023-09-03 DIAGNOSIS — I480 Paroxysmal atrial fibrillation: Secondary | ICD-10-CM | POA: Diagnosis not present

## 2023-09-03 DIAGNOSIS — T466X5D Adverse effect of antihyperlipidemic and antiarteriosclerotic drugs, subsequent encounter: Secondary | ICD-10-CM

## 2023-09-03 DIAGNOSIS — I2510 Atherosclerotic heart disease of native coronary artery without angina pectoris: Secondary | ICD-10-CM

## 2023-09-03 DIAGNOSIS — I3139 Other pericardial effusion (noninflammatory): Secondary | ICD-10-CM

## 2023-09-03 DIAGNOSIS — I351 Nonrheumatic aortic (valve) insufficiency: Secondary | ICD-10-CM | POA: Diagnosis not present

## 2023-09-03 DIAGNOSIS — I319 Disease of pericardium, unspecified: Secondary | ICD-10-CM

## 2023-09-03 NOTE — Progress Notes (Signed)
Cardiology Office Note:    Date:  09/03/2023   ID:  [name], DOB 11/16/1955, MRN [id number]  PCP:  Ollen Bowl, MD  Cardiologist:  Christell Constant, MD   Referring MD: Ollen Bowl, MD   CC: Transition to new cardiologist   History of Present Illness:    [name] is a 67 y.o. female with a hx of CAD s/p DES to [facility] and mLAD 07/11/15 (patent stents on repeat cath 11/2016), chronic pericardial effusion, h/o chronic pericarditis with chronic pericardial rub treated intermittently with steroids and colchicine, iritis, PAF on Eliquis and dofetilide therapy, chronic diastolic CHF, DM, HTN, remote h/o treated non-small cell lung cancer, pulmonary fibrosis, aortic insufficiency, COPD and anemia.  Discussed the use of AI scribe software for clinical note transcription with the patient, who gave verbal consent to proceed.  [name], a 67 year old individual with a complex medical history, presents for a routine follow-up. She has a history of coronary artery disease, previously treated with percutaneous intervention to the coronary arteries in 2016. She reports no current chest pain and her cholesterol levels are well-managed on her current medication regimen.  She also has a history of paroxysmal atrial fibrillation, managed with dofetilide and ablation in 2023. She is currently on Eliquis, diltiazem, and metoprolol for atrial fibrillation management. She reports no current symptoms related to atrial fibrillation.  [name] has a history of chronic pericardial effusion and pericarditis, previously treated intermittently with steroids and colchicine. She reports no current symptoms related to these conditions.  Needed   She has hypertension, which is well-managed off angiotensin receptor blockers. She also has diabetes, with her A1c levels trending towards her goal of under 7.0. She has been able to come off some medications, including losartan.  [name] was previously  treated for non-small cell lung cancer and has known pulmonary fibrosis. She underwent chemotherapy and radiation for the lung cancer. She also has COPD and anemia.  She has a history of heart failure with preserved ejection fraction, managed with Lasix as needed. She reports no current symptoms related to heart failure.  She has two heart murmurs, mild pulmonic stenosis and moderate aortic regurgitation.  She was unaware of this.  [name] is currently on a GLP-1 receptor antagonist medication for diabetes management. She reports no current side effects from this medication. She is also on spironolactone for blood pressure management, kidney protection, and fluid management.  She has a history of non-small cell lung cancer, treated with chemotherapy and radiation, with subsequent pulmonary fibrosis.  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 She reports no current physical limitations or symptoms. She does not engage in regular exercise. She occasional sees Dr. Katrinka Blazing, her prior cardiologist, at church.   Past Medical History:  Diagnosis Date   Allergy    Anemia    Aortic insufficiency    a. Mod by echo 05/2015.   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 Abnormal stress test -> LHc 06/2015 s/p DES to mCX and mLAD, residual D2 disease treated medially.   Diverticulitis    Erythema nodosum    Heart murmur    History of kidney stones    Hypertension  Insomnia    lung ca dx'd 10/1999   chemo/xrt comp 05/29/2000   Myalgia    Obesity    Pericardial effusion    a. 05/2015 Echo: EF 55-60%, no rwma, Gr 1 DD, no effusion - but an effusion was seen on CT which was felt to be increased in size compared to 12/2014 - pericardium also  thickened.    Pericarditis    a. Dx 05/2015.   Pneumothorax, spontaneous, tension 01/20/2012   October, 2010   PONV (postoperative nausea and vomiting)    Pulmonary fibrosis (HCC) 01/20/2012   Due to previous surgery and chest radiation for lung cancer   Type II diabetes mellitus [facility])     Past Surgical History:  Procedure Laterality Date   ABDOMINAL HYSTERECTOMY  10/21/2006   APPENDECTOMY  1969?   ATRIAL FIBRILLATION ABLATION N/A 02/27/2022   Procedure: ATRIAL FIBRILLATION ABLATION;  Surgeon: Regan Lemming, MD;  Location: [facility] INVASIVE CV LAB;  Service: Cardiovascular;  Laterality: N/A;   BRONCHIAL WASHINGS  08/03/2021   Procedure: BRONCHIAL WASHINGS;  Surgeon: Oretha Milch, MD;  Location: [facility] ENDOSCOPY;  Service: Cardiopulmonary;;   CARDIAC CATHETERIZATION N/A 07/11/2015   Procedure: Left Heart Cath and Coronary Angiography;  Surgeon: Corky Crafts, MD;  Location: [facility] INVASIVE CV LAB;  Service: Cardiovascular;  Laterality: N/A;   CARDIAC CATHETERIZATION  07/11/2015   Procedure: Coronary Stent Intervention;  Surgeon: Corky Crafts, MD;  Location: [facility] INVASIVE CV LAB;  Service: Cardiovascular;;   CARPAL TUNNEL RELEASE Bilateral 1998-2005?   right-left   CARPAL TUNNEL RELEASE Left 2009?   "for the 2nd time"   CHOLECYSTECTOMY N/A 10/01/2022   Procedure: LAPAROSCOPIC CHOLECYSTECTOMY;  Surgeon: Berna Bue, MD;  Location: [facility] ORS;  Service: General;  Laterality: N/A;   COLONOSCOPY     COLONOSCOPY WITH PROPOFOL N/A 05/28/2018   Procedure: COLONOSCOPY WITH PROPOFOL;  Surgeon: Rachael Fee, MD;  Location: [facility] ENDOSCOPY;  Service: Endoscopy;  Laterality: N/A;   CORONARY ANGIOPLASTY     CYSTOSCOPY W/ STONE MANIPULATION  10/22/2011   ESOPHAGOGASTRODUODENOSCOPY (EGD) WITH ESOPHAGEAL DILATION  10/21/2010   FOOT SURGERY Bilateral 06/22/1979   Callous removed    INNER EAR SURGERY Right ~ 2009   KNEE ARTHROPLASTY Right 10/21/1989   KNEE ARTHROSCOPY  ~ 2000   LATERAL  RELEASE   LEFT HEART CATH AND CORONARY ANGIOGRAPHY N/A 12/18/2016   Procedure: Left Heart Cath and Coronary Angiography;  Surgeon: Kathleene Hazel, MD;  Location: [facility] INVASIVE CV LAB;  Service: Cardiovascular;  Laterality: N/A;   LEFT HEART CATH AND CORONARY ANGIOGRAPHY N/A 02/01/2021   Procedure: LEFT HEART CATH AND CORONARY ANGIOGRAPHY;  Surgeon: Marykay Lex, MD;  Location: [facility] INVASIVE CV LAB;  Service: Cardiovascular;  Laterality: N/A;   LUNG CANCER SURGERY Left 10/22/1999   "small cell"   LUNG SURGERY Right 10/21/2005   "reinflated it"   PULMONARY EMBOLISM SURGERY  10/21/2005   LUNG COLLAPSE   RIGHT AND LEFT HEART CATH N/A 06/25/2017   Procedure: RIGHT AND LEFT HEART CATH;  Surgeon: Tonny Bollman, MD;  Location: [facility] INVASIVE CV LAB;  Service: Cardiovascular;  Laterality: N/A;   SHOULDER ADHESION RELEASE Right ~ 2004   SHOULDER ARTHROSCOPY W/ ROTATOR CUFF REPAIR Right ~ 2003   TOTAL KNEE ARTHROPLASTY Left 04/14/2023   Procedure: TOTAL KNEE ARTHROPLASTY;  Surgeon: Jodi Geralds, MD;  Location: [facility] ORS;  Service: Orthopedics;  Laterality: Left;   UPPER GASTROINTESTINAL ENDOSCOPY  06/05/2021   VIDEO BRONCHOSCOPY N/A 08/03/2021   Procedure:  VIDEO BRONCHOSCOPY WITHOUT FLUORO;  Surgeon: Oretha Milch, MD;  Location: [facility] ENDOSCOPY;  Service: Cardiopulmonary;  Laterality: N/A;    Current Medications: Current Meds  Medication Sig   acetaminophen (TYLENOL) 650 MG CR tablet Take 1,300 mg by mouth every 8 (eight) hours as needed for pain (headache).   apixaban (ELIQUIS) 5 MG TABS tablet TAKE 1 TABLET BY MOUTH TWICE A DAY   co-enzyme Q-10 50 MG capsule TAKE 1 CAPSULE BY MOUTH EVERY DAY   cyclobenzaprine (FLEXERIL) 10 MG tablet Take 1 tablet (10 mg total) by mouth 2 (two) times daily as needed for muscle spasms.   dicyclomine (BENTYL) 20 MG tablet TAKE 1 TABLET BY MOUTH 4 TIMES A DAY AS NEEDED FOR ABDOMINAL PAIN   diltiazem (CARDIZEM CD) 360 MG 24 hr capsule TAKE 1 CAPSULE BY MOUTH EVERY  DAY   docusate sodium (COLACE) 100 MG capsule Take 1 capsule (100 mg total) by mouth 2 (two) times daily as needed (for constipation).   furosemide (LASIX) 40 MG tablet Take 1 tablet (40 mg total) by mouth daily as needed for fluid or edema.   levothyroxine (SYNTHROID, LEVOTHROID) 50 MCG tablet Take 50 mcg by mouth daily before breakfast.    metoprolol succinate (TOPROL-XL) 50 MG 24 hr tablet TAKE 1.5 TABLETS (75 MG TOTAL) BY MOUTH AT BEDTIME. TAKE WITH OR IMMEDIATELY FOLLOWING A MEAL.   MOUNJARO 2.5 MG/0.5ML Pen Inject 2.5 mg into the skin once a week.   nitroGLYCERIN (NITROSTAT) 0.4 MG SL tablet Place 0.4 mg under the tongue every 5 (five) minutes as needed for chest pain.   NOVOLOG MIX 70/30 FLEXPEN (70-30) 100 UNIT/ML FlexPen Inject 30 Units into the skin daily with breakfast. Also, 35 units in the evening   oxyCODONE (ROXICODONE) 5 MG immediate release tablet Take 1-2 tablets (5-10 mg total) by mouth every 6 (six) hours as needed for severe pain.   rosuvastatin (CRESTOR) 20 MG tablet Take 20 mg by mouth daily.   spironolactone (ALDACTONE) 25 MG tablet TAKE 1 TABLET (25 MG TOTAL) BY MOUTH DAILY.   tiZANidine (ZANAFLEX) 4 MG tablet Take 1 tablet (4 mg total) by mouth 3 (three) times daily as needed (for muscle spasm / pain).     Allergies:   Hydrocodone, Morphine and codeine, Crestor [rosuvastatin], Invokana [canagliflozin], Metformin hcl, Lisinopril, and Nsaids   Social History   Socioeconomic History   Marital status: Single    Spouse name: Not on file   Number of children: 0   Years of education: Not on file   Highest education level: Not on file  Occupational History   Occupation: RETIRED  Tobacco Use   Smoking status: Former    Current packs/day: 0.00    Average packs/day: 0.3 packs/day for 5.0 years (1.3 ttl pk-yrs)    Types: Cigarettes    Start date: 07/21/1994    Quit date: 07/22/1999    Years since quitting: 24.1   Smokeless tobacco: Never   Tobacco comments:    Former  smoker 11/28/22  Vaping Use   Vaping status: Never Used  Substance and Sexual Activity   Alcohol use: No    Alcohol/week: 0.0 standard drinks of alcohol   Drug use: No   Sexual activity: Yes    Birth control/protection: Other-see comments    Comment: hysterectomy  Other Topics Concern   Not on file  Social History Narrative   Not on file   Social Determinants of Health   Financial Resource Strain: Low Risk  (07/12/2021)  Overall Financial Resource Strain (CARDIA)    Difficulty of Paying Living Expenses: Not hard at all  Food Insecurity: No Food Insecurity (04/14/2023)   Hunger Vital Sign    Worried About Running Out of Food in the Last Year: Never true    Ran Out of Food in the Last Year: Never true  Transportation Needs: No Transportation Needs (04/14/2023)   PRAPARE - Administrator, Civil Service (Medical): No    Lack of Transportation (Non-Medical): No  Physical Activity: Inactive (07/12/2021)   Exercise Vital Sign    Days of Exercise per Week: 0 days    Minutes of Exercise per Session: 0 min  Stress: No Stress Concern Present (07/12/2021)   [employer] of Occupational Health - Occupational Stress Questionnaire    Feeling of Stress : Not at all  Social Connections: Not on file     Family History: The patient's family history includes Diabetes in her father, maternal grandmother, mother, paternal grandmother, sister, sister, sister, and sister; Heart disease in her father; Hypertension in her sister; Kidney disease in her sister; Pancreatic cancer in her mother. There is no history of Colon cancer, Colon polyps, Rectal cancer, Stomach cancer, Esophageal cancer, or Breast cancer.  ROS:   Please see the history of present illness.     EKGs/Labs/Other Studies Reviewed:    The following studies were reviewed today:   Cardiac Studies & Procedures   CARDIAC CATHETERIZATION  CARDIAC CATHETERIZATION 02/01/2021  Narrative  The left ventricular systolic  function is normal. The left ventricular ejection fraction is 55-65% by visual estimate.  LV end diastolic pressure is normal.  Prox LAD lesion is 30% stenosed just prior to long stent  Prox LAD to Mid LAD long DES stent (38mm) is 5% stenosed. The jailed 1st Diag lesion is 50% stenosed.  Prox Cx to Mid Cx DES Stent is 20% stenosed.  1st Mrg lesion is 20% stenosed.  SUMMARY  Left Dominant System with Two-Vessel Disease:  Proximal LAD focal 20 to 30% calcified nodule just prior to a long stent that is widely patent.  The jailed diagonal branch has 60% stenosis.  LAD is wraparound perfuses the distal third of the PDA territory  Large dominant LCx with 1 major OM 1, smaller OM 2 before going to the AV groove to give a PL 1 and PDA.  Just after OM1 there is a widely patent stent  Preserved LVEF with normal EDP.    RECOMMENDATIONS  Consider nonanginal chest pain -> potentially could have residual findings of pericarditis.  Discharge after bedrest    Bryan Lemma, MD  Findings Coronary Findings Diagnostic  Dominance: Left  Left Main Vessel is large.  Left Anterior Descending Prox LAD lesion is 30% stenosed. The lesion is discrete and eccentric. The lesion is moderately calcified. The lesion was previously treated. Prox LAD to Mid LAD lesion is 5% stenosed. The lesion was previously treated using a drug eluting stent . Previously placed stent displays restenosis.  First Diagonal Branch 1st Diag lesion is 50% stenosed. The lesion is concentric.  Second Septal Branch Vessel is small in size.  Third Diagonal Branch Vessel is small in size.  Ramus Intermedius Vessel is small.  Left Circumflex Vessel is large. Prox Cx to Mid Cx lesion is 20% stenosed. The lesion is located at the bend. The lesion was previously treated using a drug eluting stent over 2 years ago. Previously placed stent displays restenosis.  First Obtuse Marginal Branch Vessel is large in  size. 1st  Mrg lesion is 20% stenosed. The lesion is segmental and eccentric.  Second Obtuse Marginal Branch Vessel is moderate in size.  Second Left Posterolateral Branch Vessel is small in size.  Right Coronary Artery Vessel is small.  Acute Marginal Branch Vessel is small in size.  Right Ventricular Branch Vessel is small in size.  Intervention  No interventions have been documented.   CARDIAC CATHETERIZATION  CARDIAC CATHETERIZATION 06/25/2017  Narrative 1. Elevated left and right heart diastolic filling pressures 2. Preserved cardiac output 3. No evidence of ventricular interdependence based on simultaneous LV/RV pressure recording  Summary: Probable biventricular diastolic dysfunction. No clear evidence of the hemodynamic sequelae of pericardial constriction   STRESS TESTS  MYOCARDIAL PERFUSION IMAGING 02/20/2022  Narrative   The study is normal. The study is low risk.   No ST deviation was noted.   Left ventricular function is normal. Nuclear stress EF: 63 %. The left ventricular ejection fraction is normal (55-65%). End diastolic cavity size is normal.   Prior study available for comparison from 09/27/2016.   ECHOCARDIOGRAM  ECHOCARDIOGRAM COMPLETE 02/05/2023  Narrative ECHOCARDIOGRAM REPORT    Patient Name:   [name] Date of Exam: 02/05/2023 Medical Rec #:  [id number]     Height:       67.5 in Accession #:    [id number]    Weight:       222.2 lb Date of Birth:  January 03, 1956     BSA:          2.126 m Patient Age:    66 years      BP:           161/85 mmHg Patient Gender: F             HR:           86 bpm. Exam Location:  [address]  Procedure: 2D Echo, 3D Echo, Cardiac Doppler and Color Doppler  Indications:    R06.00 Dyspnea  History:        Patient has prior history of Echocardiogram examinations, most recent 07/18/2022. CHF, CAD, Pericarditis, pericardial effusion; Risk Factors:Diabetes and Hypertension.  Sonographer:    Clearence Ped RCS Referring  Phys: 13 TESSA N CONTE  IMPRESSIONS   1. Left ventricular ejection fraction, by estimation, is 55 to 60%. The left ventricle has normal function. The left ventricle has no regional wall motion abnormalities. Left ventricular diastolic parameters are indeterminate. 2. Right ventricular systolic function is normal. The right ventricular size is normal. There is normal pulmonary artery systolic pressure. The estimated right ventricular systolic pressure is 15.4 mmHg. 3. The mitral valve is normal in structure. No evidence of mitral valve regurgitation. No evidence of mitral stenosis. 4. The aortic valve is tricuspid. Aortic valve regurgitation is moderate. No aortic stenosis is present. 5. Turbulence across the pulmonic valve with peak gradient 22 mmHg. The pulmonic valve was abnormal. Mild pulmonic stenosis. 6. The inferior vena cava is normal in size with greater than 50% respiratory variability, suggesting right atrial pressure of 3 mmHg. 7. No evidence for effusive/constrictive pericarditis. a small pericardial effusion is present. The pericardial effusion is circumferential.  FINDINGS Left Ventricle: Left ventricular ejection fraction, by estimation, is 55 to 60%. The left ventricle has normal function. The left ventricle has no regional wall motion abnormalities. The left ventricular internal cavity size was normal in size. There is no left ventricular hypertrophy. Left ventricular diastolic parameters are indeterminate. The ratio of pulmonic flow to systemic flow (  Qp/Qs ratio) is 3.10.  Right Ventricle: The right ventricular size is normal. No increase in right ventricular wall thickness. Right ventricular systolic function is normal. There is normal pulmonary artery systolic pressure. The tricuspid regurgitant velocity is 1.76 m/s, and with an assumed right atrial pressure of 3 mmHg, the estimated right ventricular systolic pressure is 15.4 mmHg.  Left Atrium: Left atrial size was normal  in size.  Right Atrium: Right atrial size was normal in size.  Pericardium: No evidence for effusive/constrictive pericarditis. A small pericardial effusion is present. The pericardial effusion is circumferential.  Mitral Valve: The mitral valve is normal in structure. No evidence of mitral valve regurgitation. No evidence of mitral valve stenosis.  Tricuspid Valve: The tricuspid valve is normal in structure. Tricuspid valve regurgitation is trivial.  Aortic Valve: The aortic valve is tricuspid. Aortic valve regurgitation is moderate. Aortic regurgitation PHT measures 338 msec. No aortic stenosis is present.  Pulmonic Valve: Turbulence across the pulmonic valve with peak gradient 22 mmHg. The pulmonic valve was abnormal. Pulmonic valve regurgitation is mild. Mild pulmonic stenosis.  Aorta: The aortic root is normal in size and structure.  Venous: The inferior vena cava is normal in size with greater than 50% respiratory variability, suggesting right atrial pressure of 3 mmHg.  IAS/Shunts: No atrial level shunt detected by color flow Doppler. The ratio of pulmonic flow to systemic flow (Qp/Qs ratio) is 3.10.   LEFT VENTRICLE PLAX 2D LVIDd:         4.80 cm   Diastology LVIDs:         3.30 cm   LV e' medial:    8.59 cm/s LV PW:         0.80 cm   LV E/e' medial:  7.6 LV IVS:        0.70 cm   LV e' lateral:   7.62 cm/s LVOT diam:     1.90 cm   LV E/e' lateral: 8.5 LV SV:         63 LV SV Index:   30 LVOT Area:     2.84 cm  3D Volume EF: 3D EF:        51 % LV EDV:       185 ml LV ESV:       91 ml LV SV:        94 ml  RIGHT VENTRICLE RV Basal diam:  3.70 cm RV S prime:     13.30 cm/s RVOT diam:      2.30 cm TAPSE (M-mode): 1.6 cm RVSP:           15.4 mmHg  LEFT ATRIUM             Index        RIGHT ATRIUM           Index LA diam:        3.70 cm 1.74 cm/m   RA Pressure: 3.00 mmHg LA Vol (A2C):   50.0 ml 23.51 ml/m  RA Area:     13.00 cm LA Vol (A4C):   53.0 ml 24.92 ml/m   RA Volume:   30.90 ml  14.53 ml/m LA Biplane Vol: 52.8 ml 24.83 ml/m AORTIC VALVE             PULMONIC VALVE LVOT Vmax:   122.00 cm/s PV Area (Vmax):  3.33 cm LVOT Vmean:  84.600 cm/s PV Area (Vmean): 3.43 cm LVOT VTI:    0.222 m  PV Area (VTI):   3.62 cm AI PHT:      338 msec    PV Vmax:         2.32 m/s PV Vmean:        167.000 cm/s AORTA                    PV VTI:          0.535 m Ao Root diam: 3.20 cm    PV Peak grad:    21.5 mmHg Ao Asc diam:  3.50 cm    PV Mean grad:    12.0 mmHg RVOT Peak grad:  14 mmHg   MITRAL VALVE               TRICUSPID VALVE MV Area (PHT): 5.27 cm    TR Peak grad:   12.4 mmHg MV Decel Time: 144 msec    TR Vmax:        176.00 cm/s MV E velocity: 65.00 cm/s  Estimated RAP:  3.00 mmHg MV A velocity: 58.80 cm/s  RVSP:           15.4 mmHg MV E/A ratio:  1.11 SHUNTS Systemic VTI:  0.22 m Systemic Diam: 1.90 cm Pulmonic VTI:  0.466 m Pulmonic Diam: 2.30 cm Qp/Qs:         3.08  Dalton McleanMD Electronically signed by Wilfred Lacy Signature Date/Time: 02/05/2023/2:51:49 PM    Final    MONITORS  LONG TERM MONITOR (3-14 DAYS) 08/05/2019  Narrative Minimum: 53 bpm (01:01:28 Day 3) Maximum: 135 bpm (00:46:54 Day 9) Average: 80 bpm Predominant rhythm sinus rhythm Atrial fibrillation <1%  Will Camnitz, MD    CARDIAC MRI  MR CARDIAC MORPHOLOGY W WO CONTRAST 04/14/2017  Narrative CLINICAL DATA:  Pericarditis  EXAM: CARDIAC MRI  TECHNIQUE: The patient was scanned on a 1.5 Tesla GE magnet. A dedicated cardiac coil was used. Functional imaging was done using Fiesta sequences. 2,3, and 4 chamber views were done to assess for RWMA's. Modified Simpson's rule using a short axis stack was used to calculate an ejection fraction on a dedicated work [employer]. The patient received Multihance. After 10 minutes inversion recovery sequences were used to assess for infiltration and scar tissue.  CONTRAST:   Multihance  FINDINGS: There was mild LAE. The RA and RV were normal. There was no abnormal septal bounce or evidence of ventricular inter-dependence although free breathing sequences were not performed The aortic valve was tri leaflet with some degree of AR that was not well characterized on this study. Mitral and tricuspid valves normal. The patient had a large amount of both visceral and parietal epicardial adipose tissue. There was a moderate pericardial effusion posterior to the LV and lateral to the RV free wall This was not appreciated on her recent echo due to poor acoustic windows and lateral drop out. The pericardium itself was thin and there were no signs of diastolic RV collapse. Post gadolinium images showed mild pericardial uptake over the RV free wall and apex on 3 chamber views. There was no evidence of myocarditis or LV myocardial uptake.  IMPRESSION: 1) Moderate pericardial effusion lateral to the RV free wall and posterior LV myocardium. Note this is not appreciated on recent echo  2) Normal pericardial thickness with mild gadolinium uptake over the RV free wall and apex on 3 chamber view  3) No evidence of constriction although MRI especially with no free breathing sequences not ideal to assess Pericardium  normal thickness and no ventricular inter dependence  4) Normal LV EF 59% (EDV 105 ESV 43 SV 62 cc)  5) No evidence of myocarditis or LV myocardial uptake of gadolinium on delayed inversion recovery sequences  6) Tri leaflet aortic valve with AR  7) Mild LAE  Images were also reviewed by Dr Marca Ancona who agreed with above findings  Charlton Haws   Electronically Signed By: Charlton Haws M.D. On: 04/14/2017 15:12          Recent Labs: 11/28/2022: Magnesium 1.9 06/21/2023: ALT 9; BUN 17; Creatinine, Ser 0.90; Hemoglobin 11.2; Platelets 290; Potassium 4.6; Sodium 141  Recent Lipid Panel    Component Value Date/Time   CHOL 127 11/27/2021 0408    CHOL 130 04/25/2021 1614   TRIG 72 11/27/2021 0408   HDL 44 11/27/2021 0408   HDL 44 04/25/2021 1614   CHOLHDL 2.9 11/27/2021 0408   VLDL 14 11/27/2021 0408   LDLCALC 69 11/27/2021 0408   LDLCALC 67 04/25/2021 1614    Physical Exam:    VS:  BP 120/68   Pulse 68   Ht 5\' 7"  (1.702 m)   Wt 220 lb (99.8 kg)   BMI 34.46 kg/m     Wt Readings from Last 3 Encounters:  09/03/23 220 lb (99.8 kg)  06/21/23 214 lb 1.1 oz (97.1 kg)  05/29/23 214 lb (97.1 kg)     GEN: No acute distress.  HEENT: Normal NECK: No JVD. CARDIAC: Systolic and diastolic murmurs noted. RRR no gallop, or edema. VASCULAR:  Normal Pulses. RESPIRATORY:  Clear to auscultation without rales, wheezing or rhonchi  ABDOMEN: Soft, non-tender, non-distended MUSCULOSKELETAL: No deformity  SKIN: Warm and dry NEUROLOGIC:  Alert and oriented x 3 PSYCHIATRIC:  Normal affect   ASSESSMENT:    1. Chronic diastolic (congestive) heart failure (HCC)   2. Hypertension, essential   3. Aortic valve insufficiency, etiology of cardiac valve disease unspecified   4. Paroxysmal atrial fibrillation (HCC)   5. Pericardial effusion   6. CAD S/P percutaneous coronary angioplasty   7. Chronic pericarditis, unspecified complication status, unspecified type   8. Hypercoagulable state due to paroxysmal atrial fibrillation (HCC)   9. OSA (obstructive sleep apnea)   10. Statin myopathy     PLAN:    Coronary Artery Disease - Coronary artery disease with percutaneous intervention to the CERC and LAD in 2016. Currently asymptomatic with well-controlled cholesterol levels. - Continue current medications  Paroxysmal Atrial Fibrillation - Paroxysmal atrial fibrillation managed with CHADSVASC 5+ on  diltiazem, metoprolol, and Eliquis. Status post ablation in 2023. Currently in sinus rhythm with no bleeding issues. - Continue current medications  Chronic Diastolic Heart Failure - Heart failure with preserved ejection fraction. Currently  well-managed without need for increased diuretic medication. - Continue current medications  Chronic Pericardial Effusion and Pericarditis - Chronic pericardial effusion and pericarditis, intermittently treated with steroids and colchicine.  Currently asymptomatic. 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 Need for repeat echocardiogram and potential cardiac MRI if severity is uncertain. - Repeat echocardiogram in April  Aortic Insufficiency - Moderate aortic regurgitation. Need for repeat echocardiogram and potential cardiac MRI if severity is uncertain. - Repeat echocardiogram in April - Consider cardiac MRI if severity is uncertain  Hypertension - Hypertension, currently well-controlled off angiotensin receptor blocker. Discussed losartan for  kidney protection in diabetes but decided against it due to polypharmacy. - Continue monitoring blood pressure - DC ARB  Diabetes Mellitus - Elevated blood sugar with A1c under 8, aiming for under 7. Currently on Mounjaro. Discussed the importance of weight training to prevent sarcopenia associated with GLP-1 receptor antagonist medication. - Continue Mounjaro as per PCP - Encourage weight training to prevent sarcopenia  Non-Small Cell Lung Cancer - Non-small cell lung cancer treated with chemotherapy and radiation. Currently in remission. - aggressive LDL goal with radiation exposure; no evidence of chemo cardiomyopathy  Chronic Obstructive Pulmonary Disease (COPD) - Monitor respiratory status. Rest as per PCP  Microcytic Anemia - Monitor blood counts, no bleeding issues  Follow-up - Repeat echocardiogram in April - Schedule follow-up visit after echocardiogram.   Medication Adjustments/Labs and Tests  Ordered: Current medicines are reviewed at length with the patient today.  Concerns regarding medicines are outlined above.  Orders Placed This Encounter  Procedures   ECHOCARDIOGRAM COMPLETE   No orders of the defined types were placed in this encounter.   Patient Instructions  Medication Instructions:  Your physician has recommended you make the following change in your medication:  STOP: Losartan  *If you need a refill on your cardiac medications before your next appointment, please call your pharmacy*   Lab Work: NONE  If you have labs (blood work) drawn today and your tests are completely normal, you will receive your results only by: MyChart Message (if you have MyChart) OR A paper copy in the mail If you have any lab test that is abnormal or we need to change your treatment, we will call you to review the results.   Testing/Procedures: APRIL 2025- -   Your physician has requested that you have an echocardiogram. Echocardiography is a painless test that uses sound waves to create images of your heart. It provides your doctor with information about the size and shape of your heart and how well your heart's chambers and valves are working. This procedure takes approximately one hour. There are no restrictions for this procedure. Please do NOT wear cologne, perfume, aftershave, or lotions (deodorant is allowed). Please arrive 15 minutes prior to your appointment time.  Please note: We ask at that you not bring children with you during ultrasound (echo/ vascular) testing. Due to room size and safety concerns, children are not allowed in the ultrasound rooms during exams. Our front office staff cannot provide observation of children in our lobby area while testing is being conducted. An adult accompanying a patient to their appointment will only be allowed in the ultrasound room at the discretion of the ultrasound technician under special circumstances. We apologize for any  inconvenience.    Follow-Up: At [facility], you and your health needs are our priority.  As part of our continuing mission to provide you with exceptional heart care, we have created designated Provider Care Teams.  These Care Teams include your primary Cardiologist (physician) and Advanced Practice Providers (APPs -  Physician Assistants and Nurse Practitioners) who all work together to provide you with the care you need, when you need it.   Your next appointment:   6 month(s)  Provider:   Christell Constant, MD        Signed, Christell Constant, MD  09/03/2023 1:00 PM    [facility]

## 2023-09-03 NOTE — Patient Instructions (Signed)
Medication Instructions:  Your physician has recommended you make the following change in your medication:  STOP: Losartan  *If you need a refill on your cardiac medications before your next appointment, please call your pharmacy*   Lab Work: NONE  If you have labs (blood work) drawn today and your tests are completely normal, you will receive your results only by: MyChart Message (if you have MyChart) OR A paper copy in the mail If you have any lab test that is abnormal or we need to change your treatment, we will call you to review the results.   Testing/Procedures: APRIL 2025- -   Your physician has requested that you have an echocardiogram. Echocardiography is a painless test that uses sound waves to create images of your heart. It provides your doctor with information about the size and shape of your heart and how well your heart's chambers and valves are working. This procedure takes approximately one hour. There are no restrictions for this procedure. Please do NOT wear cologne, perfume, aftershave, or lotions (deodorant is allowed). Please arrive 15 minutes prior to your appointment time.  Please note: We ask at that you not bring children with you during ultrasound (echo/ vascular) testing. Due to room size and safety concerns, children are not allowed in the ultrasound rooms during exams. Our front office staff cannot provide observation of children in our lobby area while testing is being conducted. An adult accompanying a patient to their appointment will only be allowed in the ultrasound room at the discretion of the ultrasound technician under special circumstances. We apologize for any inconvenience.    Follow-Up: At [facility], you and your health needs are our priority.  As part of our continuing mission to provide you with exceptional heart care, we have created designated Provider Care Teams.  These Care Teams include your primary Cardiologist (physician) and  Advanced Practice Providers (APPs -  Physician Assistants and Nurse Practitioners) who all work together to provide you with the care you need, when you need it.   Your next appointment:   6 month(s)  Provider:   Christell Constant, MD

## 2023-09-23 ENCOUNTER — Other Ambulatory Visit: Payer: Self-pay

## 2023-09-23 MED ORDER — DILTIAZEM HCL ER COATED BEADS 360 MG PO CP24: 360.0000 mg | ORAL_CAPSULE | Freq: Every day | ORAL | 3 refills | Status: DC

## 2023-09-23 NOTE — Progress Notes (Signed)
Refill for Diltiazem (Cardizem) has been placed per pt request.

## 2023-10-06 ENCOUNTER — Ambulatory Visit (HOSPITAL_BASED_OUTPATIENT_CLINIC_OR_DEPARTMENT_OTHER): Payer: Medicare Other | Admitting: Pulmonary Disease

## 2023-10-06 ENCOUNTER — Encounter (HOSPITAL_BASED_OUTPATIENT_CLINIC_OR_DEPARTMENT_OTHER): Payer: Self-pay | Admitting: Pulmonary Disease

## 2023-10-06 VITALS — BP 142/68 | HR 82 | Ht 67.0 in | Wt 220.0 lb

## 2023-10-06 DIAGNOSIS — R042 Hemoptysis: Secondary | ICD-10-CM

## 2023-10-06 DIAGNOSIS — J984 Other disorders of lung: Secondary | ICD-10-CM

## 2023-10-06 NOTE — Patient Instructions (Signed)
RSV vaccine recommended

## 2023-10-06 NOTE — Progress Notes (Signed)
Subjective:    Patient ID: [name], female    DOB: 09-10-1956, 67 y.o.   MRN: [id number]  HPI  67 yo never smoker for FU of dyspnea attributed to restrictive lung disease    PMH -  LUlobectomy for spont pneumothx in 07/2006, s/p chemo/ XRT for stg III B lung cancer in 2001 - in remission since!   - chronic pericarditis since 2016- NSAIDs, colchicine and steroids, was referred to [facility]. Serology neg , recurrence 2022 -Episcleritis on Imuran -CAD -cardiac cath 06/2015 required 2 stents, repeat 06/2017,  moderate aortic regurgitation -Paroxysmal atrial fibrillation - EGD 11/2017 which showed mild narrowing of the mid esophagus without mass -Hemoptysis since March 2022-ENT laryngoscopy negative, GI endoscopy negative, bronchoscopy negative  Discussed the use of AI scribe software for clinical note transcription with the patient, who gave verbal consent to proceed.  History of Present Illness   The patient, with a history of cancer, heart disease, and diabetes, presents for follow-up after a fall. She had recently undergone a knee replacement and was recovering well until she fell while caring for her sister's pets. The fall resulted in significant knee pain and difficulty bearing weight. She sought help from physical therapy and has been using a walker since the incident. She is hopeful to be released from therapy by the end of the month.  In addition to the knee pain, the patient also reports occasional hemoptysis, which has been evaluated previously with endoscopy and imaging, both of which were unremarkable. The patient is on a blood thinner, which may contribute to this symptom.  The patient also reports improvement in her chronic dyspnea since undergoing heart ablation and losing weight. She continues to take Lasix and Mounjaro for her heart disease and diabetes, respectively. Her A1c was recently 6, and she is working to lower it further.        Significant tests/ events reviewed    Bronchoscopy 07/2021 Friable mucosa   CT chest wo con 07/2023 >> unchanged CT chest wo con 06/2022 >> treatment changes in the left apex with scarring and bronchiectasis similar to previous exam, residual changes of lung resection on the right CT chest with contrast 01/2020  >>esophageal air fluid level suggests dysmotility or gastroesophageal reflux   02/2020 HST AHI 6/h 8/202022 NPSG AHI 8/h - 232 lbs  Review of Systems neg for any significant sore throat, dysphagia, itching, sneezing, nasal congestion or excess/ purulent secretions, fever, chills, sweats, unintended wt loss, pleuritic or exertional cp, hempoptysis, orthopnea pnd or change in chronic leg swelling. Also denies presyncope, palpitations, heartburn, abdominal pain, nausea, vomiting, diarrhea or change in bowel or urinary habits, dysuria,hematuria, rash, arthralgias, visual complaints, headache, numbness weakness or ataxia.     Objective:   Physical Exam  Gen. Pleasant, obese, in no distress ENT - no lesions, no post nasal drip Neck: No JVD, no thyromegaly, no carotid bruits Lungs: no use of accessory muscles, no dullness to percussion, decreased without rales or rhonchi  Cardiovascular: Rhythm regular, heart sounds  normal, no murmurs or gallops, no peripheral edema Musculoskeletal: No deformities, no cyanosis or clubbing , no tremors       Assessment & Plan:      Assessment and Plan    Chronic Hemoptysis   Intermittent hemoptysis, likely related to blood thinner medication. Previous scans and endoscopy negative. No new findings on recent scan.   - Monitor hemoptysis   - Record and send episodes via MyChart if she worsens  Chronic Dyspnea   Restrictive lung disease with chronic dyspnea, improved post-heart ablation and weight loss. Less shortness of breath except during long distances.   - Continue current management   - Monitor for symptom changes    Post-Knee Replacement Recovery   Recovering from knee  replacement surgery. Recent fall caused significant pain and difficulty walking. No fractures or bleeding. Undergoing physical therapy, expected release by end of month.   - Continue physical therapy until end of month   - Monitor for new symptoms or complications    Type 2 Diabetes Mellitus   On Mounjaro with A1c at 6, goal to reduce to 5. Weight loss contributing to improved glycemic control.   - Continue Mounjaro   - Monitor A1c levels    General Health Maintenance   Up to date on flu, shingles, and pneumonia vaccinations. RSV vaccine recommended for adults over 60.   - Recommend RSV vaccine at pharmacy    Follow-up   - Follow up as needed   - Contact if new issues arise.

## 2023-10-13 ENCOUNTER — Other Ambulatory Visit: Payer: Self-pay | Admitting: Cardiology

## 2023-10-27 DIAGNOSIS — M25662 Stiffness of left knee, not elsewhere classified: Secondary | ICD-10-CM | POA: Diagnosis not present

## 2023-10-27 DIAGNOSIS — Z96652 Presence of left artificial knee joint: Secondary | ICD-10-CM | POA: Diagnosis not present

## 2023-10-27 DIAGNOSIS — R262 Difficulty in walking, not elsewhere classified: Secondary | ICD-10-CM | POA: Diagnosis not present

## 2023-10-29 DIAGNOSIS — M25662 Stiffness of left knee, not elsewhere classified: Secondary | ICD-10-CM | POA: Diagnosis not present

## 2023-10-29 DIAGNOSIS — R262 Difficulty in walking, not elsewhere classified: Secondary | ICD-10-CM | POA: Diagnosis not present

## 2023-10-29 DIAGNOSIS — Z96652 Presence of left artificial knee joint: Secondary | ICD-10-CM | POA: Diagnosis not present

## 2023-11-05 DIAGNOSIS — Z96652 Presence of left artificial knee joint: Secondary | ICD-10-CM | POA: Diagnosis not present

## 2023-11-05 DIAGNOSIS — R262 Difficulty in walking, not elsewhere classified: Secondary | ICD-10-CM | POA: Diagnosis not present

## 2023-11-05 DIAGNOSIS — M25662 Stiffness of left knee, not elsewhere classified: Secondary | ICD-10-CM | POA: Diagnosis not present

## 2023-11-10 DIAGNOSIS — Z96652 Presence of left artificial knee joint: Secondary | ICD-10-CM | POA: Diagnosis not present

## 2023-11-10 DIAGNOSIS — R262 Difficulty in walking, not elsewhere classified: Secondary | ICD-10-CM | POA: Diagnosis not present

## 2023-11-10 DIAGNOSIS — M25662 Stiffness of left knee, not elsewhere classified: Secondary | ICD-10-CM | POA: Diagnosis not present

## 2023-11-12 DIAGNOSIS — Z96652 Presence of left artificial knee joint: Secondary | ICD-10-CM | POA: Diagnosis not present

## 2023-11-12 DIAGNOSIS — R262 Difficulty in walking, not elsewhere classified: Secondary | ICD-10-CM | POA: Diagnosis not present

## 2023-11-12 DIAGNOSIS — M25662 Stiffness of left knee, not elsewhere classified: Secondary | ICD-10-CM | POA: Diagnosis not present

## 2023-11-17 DIAGNOSIS — M25662 Stiffness of left knee, not elsewhere classified: Secondary | ICD-10-CM | POA: Diagnosis not present

## 2023-11-17 DIAGNOSIS — Z96652 Presence of left artificial knee joint: Secondary | ICD-10-CM | POA: Diagnosis not present

## 2023-11-17 DIAGNOSIS — R262 Difficulty in walking, not elsewhere classified: Secondary | ICD-10-CM | POA: Diagnosis not present

## 2023-11-19 DIAGNOSIS — M25662 Stiffness of left knee, not elsewhere classified: Secondary | ICD-10-CM | POA: Diagnosis not present

## 2023-11-19 DIAGNOSIS — R262 Difficulty in walking, not elsewhere classified: Secondary | ICD-10-CM | POA: Diagnosis not present

## 2023-11-19 DIAGNOSIS — Z96652 Presence of left artificial knee joint: Secondary | ICD-10-CM | POA: Diagnosis not present

## 2023-11-26 DIAGNOSIS — M25662 Stiffness of left knee, not elsewhere classified: Secondary | ICD-10-CM | POA: Diagnosis not present

## 2023-11-26 DIAGNOSIS — Z96652 Presence of left artificial knee joint: Secondary | ICD-10-CM | POA: Diagnosis not present

## 2023-11-26 DIAGNOSIS — R262 Difficulty in walking, not elsewhere classified: Secondary | ICD-10-CM | POA: Diagnosis not present

## 2023-12-06 ENCOUNTER — Other Ambulatory Visit: Payer: Self-pay | Admitting: Cardiology

## 2023-12-06 DIAGNOSIS — I4891 Unspecified atrial fibrillation: Secondary | ICD-10-CM

## 2023-12-08 ENCOUNTER — Ambulatory Visit: Payer: Medicare Other | Admitting: Cardiology

## 2023-12-08 NOTE — Telephone Encounter (Signed)
Prescription refill request for Eliquis received. Indication:afib Last office visit:11/24 Scr:0.90  8/24 Age: 68 Weight:99.8  kg  Prescription refilled

## 2023-12-09 NOTE — Progress Notes (Unsigned)
Cardiology Office Note:  .   Date:  12/09/2023  ID:  [name], DOB 1956/09/19, MRN [id number] PCP: Ollen Bowl, MD  [facility] Providers Cardiologist:  Christell Constant, MD Electrophysiologist:  Regan Lemming, MD {  History of Present Illness: .   [name] is a 68 y.o. female w/PMHx of CAD (DES to [facility] and mLAD 07/11/15 >> patent stents on repeat cath 11/2016), chronic pericardial effusion, chronic pericarditis and pericardial rub, treated intermittently with steroids and colchicine, chronic CHF (diastolic), DM, HTN, remote NSCLC (tx surgically and chemo), COPD, pulmonary fibrosis (follows with Dr. Vassie Loll) VHD with mild AI, and AFib   She saw Dr. Elberta Fortis last 05/28/22, post ablation, reported no recurrent symptoms of her AFib, pt wanted to come off Tikosyn > stopped  Seeing cards team and the Afib clinic since  Afib clinic visit 05/29/23 working with PT after knee surgery, no symptoms of her Afib.  No changes were made  She saw Dr. Izora Ribas 09/03/23, no symptoms reported, discussed Rilonacept if pericardial symptoms recur, planned for surveillance echos   Today's visit is scheduled as a 79mo visit  ROS:   She is doing very well No CP, SOB. No symptoms of AFib, post ablation her QOL and exertional capacity has returned. She will get lightheaded with standing or after rising from a bento over positions Admittedly doesn't drink much water (counseled) No near syncope or syncope No bleeding or signs of bleeding  Arrhythmia/AAD hx Tikosyn started April 2020 >> stopped (pt request) post ablation on 05/28/22 Afib ablation 02/27/22  AMIODARONE is not an option with known pulmonary fibrosis at her baseline  Studies Reviewed: [name]    EKG not done today  02/05/23: TTE 1. Left ventricular ejection fraction, by estimation, is 55 to 60%. The  left ventricle has normal function. The left ventricle has no regional  wall motion abnormalities. Left ventricular  diastolic parameters are  indeterminate.   2. Right ventricular systolic function is normal. The right ventricular  size is normal. There is normal pulmonary artery systolic pressure. The  estimated right ventricular systolic pressure is 15.4 mmHg.   3. The mitral valve is normal in structure. No evidence of mitral valve  regurgitation. No evidence of mitral stenosis.   4. The aortic valve is tricuspid. Aortic valve regurgitation is moderate.  No aortic stenosis is present.   5. Turbulence across the pulmonic valve with peak gradient 22 mmHg. The  pulmonic valve was abnormal. Mild pulmonic stenosis.   6. The inferior vena cava is normal in size with greater than 50%  respiratory variability, suggesting right atrial pressure of 3 mmHg.   7. No evidence for effusive/constrictive pericarditis. a small  pericardial effusion is present. The pericardial effusion is  circumferential.    02/27/22: EPS/ablation CONCLUSIONS: 1. Sinus rhythm, upon presentation.   2. Successful electrical isolation and anatomical encircling of all four pulmonary veins with radiofrequency current.  A WACA approach was used 3. Additional left atrial ablation was performed with a standard box lesion created along the posterior wall of the left atrium 4. No early apparent complications.   02/17/2019: TTE IMPRESSIONS   1. The left ventricle has hyperdynamic systolic function, with an ejection fraction of >65%. The cavity size was normal. There is moderately increased left ventricular wall thickness. Left ventricular diastolic Doppler parameters are consistent with  pseudonormalization. Elevated left atrial and left ventricular end-diastolic pressures The E/e' is >15. No evidence of left ventricular regional wall motion  abnormalities.  2. The right ventricle has normal systolic function. The cavity was normal. There is no increase in right ventricular wall thickness.  3. The mitral valve is grossly normal.  4. The  tricuspid valve is grossly normal.  5. The aortic valve is tricuspid. Mild sclerosis of the aortic valve. Aortic valve regurgitation is mild to moderate by color flow Doppler. mildly increased gradient without stenosis of the aortic valve.  6. The aortic root and ascending aorta are normal in size and structure.  7. The inferior vena cava was dilated in size with <50% respiratory variability.   SUMMARY   LVEF 65-70%, moderate LVH, normal wall motion, grade 2 DD, elevated LV filling pressure, aortic sclerosis with moderate AI, normal RV function, mild PI, trivial TR, dilated IVC, no significant pericardial effusion   06/25/17: RHC 1. Elevated left and right heart diastolic filling pressures 2. Preserved cardiac output 3. No evidence of ventricular interdependence based on simultaneous LV/RV pressure recording   Summary: Probable biventricular diastolic dysfunction. No clear evidence of the hemodynamic sequelae of pericardial constriction     LHC 12/18/2016 Conclusion    1st Mrg lesion, 30 %stenosed. Ost 2nd Mrg to 2nd Mrg lesion, 20 %stenosed. Mid Cx lesion, 0 %stenosed. Ost LAD to Mid LAD lesion, 0 %stenosed. Ost 1st Diag to 1st Diag lesion, 70 %stenosed. 1st Diag lesion, 99 %stenosed. The left ventricular systolic function is normal. LV end diastolic pressure is normal. The left ventricular ejection fraction is greater than 65% by visual estimate. There is no mitral valve regurgitation.   1. Double vessel CAD, stable.  2. Patent stent in the proximal to mid LAD with no restenosis 3. Patent stent in the mid Circumflex with no restenosis 4. The Diagonal branch is a small caliber vessel that is jailed by the LAD stent. There is severe disease in this small branch but PCI is not a good option 5. Small non-dominant RCA 6. Normal LV systolic function   Recommendations: Continue medical management of CAD.     Risk Assessment/Calculations:    Physical Exam:   VS:  There were no  vitals taken for this visit.   Wt Readings from Last 3 Encounters:  10/06/23 220 lb (99.8 kg)  09/03/23 220 lb (99.8 kg)  06/21/23 214 lb 1.1 oz (97.1 kg)    GEN: Well nourished, well developed in no acute distress NECK: No JVD; No carotid bruits CARDIAC: RRR, + SM/DM (known for her), rubs, gallops RESPIRATORY:  CTA b/l without rales, wheezing or rhonchi  ABDOMEN: Soft, non-tender, non-distended EXTREMITIES:  No edema; No deformity   ASSESSMENT AND PLAN: .    paroxysmal AFib CHA2DS2Vasc is 6, on Eliquis, appropriately dosed no burden by symptoms  CAD No anginal symptoms C/w Dr. Barbaraann Rondo  Chronic pericardial effusion Recurrent pericarditis Murmurs on exam known for her, no clear rub noted C/w Dr. Talbert Cage  5. Chronic diastolic CHF No symptoms or exam findings of volume OL Infrequently needs her PRN lasix  6.  Secondary hypercoagulable state 2/2 AFib      Dispo: she likes Q 6 month visits, very much prefers to see him, requests not to be seen by APP   Signed, Sheilah Pigeon, PA-C

## 2023-12-10 DIAGNOSIS — E1165 Type 2 diabetes mellitus with hyperglycemia: Secondary | ICD-10-CM | POA: Diagnosis not present

## 2023-12-10 DIAGNOSIS — E039 Hypothyroidism, unspecified: Secondary | ICD-10-CM | POA: Diagnosis not present

## 2023-12-10 DIAGNOSIS — E7800 Pure hypercholesterolemia, unspecified: Secondary | ICD-10-CM | POA: Diagnosis not present

## 2023-12-11 ENCOUNTER — Ambulatory Visit: Payer: Medicare PPO | Attending: Physician Assistant | Admitting: Physician Assistant

## 2023-12-11 ENCOUNTER — Encounter: Payer: Self-pay | Admitting: Physician Assistant

## 2023-12-11 VITALS — BP 124/68 | HR 86 | Ht 67.0 in | Wt 219.4 lb

## 2023-12-11 DIAGNOSIS — D6869 Other thrombophilia: Secondary | ICD-10-CM | POA: Diagnosis not present

## 2023-12-11 DIAGNOSIS — I480 Paroxysmal atrial fibrillation: Secondary | ICD-10-CM

## 2023-12-11 DIAGNOSIS — I5032 Chronic diastolic (congestive) heart failure: Secondary | ICD-10-CM

## 2023-12-11 DIAGNOSIS — I2510 Atherosclerotic heart disease of native coronary artery without angina pectoris: Secondary | ICD-10-CM | POA: Diagnosis not present

## 2023-12-11 NOTE — Patient Instructions (Addendum)
Medication Instructions:   Your physician recommends that you continue on your current medications as directed. Please refer to the Current Medication list given to you today.   *If you need a refill on your cardiac medications before your next appointment, please call your pharmacy*   Lab Work:  NONE ORDERED  TODAY    If you have labs (blood work) drawn today and your tests are completely normal, you will receive your results only by: MyChart Message (if you have MyChart) OR A paper copy in the mail If you have any lab test that is abnormal or we need to change your treatment, we will call you to review the results.   Testing/Procedures: NONE ORDERED  TODAY     Follow-Up: At [facility], you and your health needs are our priority.  As part of our continuing mission to provide you with exceptional heart care, we have created designated Provider Care Teams.  These Care Teams include your primary Cardiologist (physician) and Advanced Practice Providers (APPs -  Physician Assistants and Nurse Practitioners) who all work together to provide you with the care you need, when you need it.  We recommend signing up for the patient portal called "MyChart".  Sign up information is provided on this After Visit Summary.  MyChart is used to connect with patients for Virtual Visits (Telemedicine).  Patients are able to view lab/test results, encounter notes, upcoming appointments, etc.  Non-urgent messages can be sent to your provider as well.   To learn more about what you can do with MyChart, go to [email or web address].    Your next appointment:    6 month(s)   Provider DR Elberta Fortis ONLY !!!      Other Instructions

## 2023-12-17 DIAGNOSIS — I4891 Unspecified atrial fibrillation: Secondary | ICD-10-CM | POA: Diagnosis not present

## 2023-12-17 DIAGNOSIS — E1165 Type 2 diabetes mellitus with hyperglycemia: Secondary | ICD-10-CM | POA: Diagnosis not present

## 2023-12-17 DIAGNOSIS — I10 Essential (primary) hypertension: Secondary | ICD-10-CM | POA: Diagnosis not present

## 2023-12-17 DIAGNOSIS — I2510 Atherosclerotic heart disease of native coronary artery without angina pectoris: Secondary | ICD-10-CM | POA: Diagnosis not present

## 2023-12-17 DIAGNOSIS — E7800 Pure hypercholesterolemia, unspecified: Secondary | ICD-10-CM | POA: Diagnosis not present

## 2023-12-17 DIAGNOSIS — E039 Hypothyroidism, unspecified: Secondary | ICD-10-CM | POA: Diagnosis not present

## 2024-01-26 ENCOUNTER — Ambulatory Visit (HOSPITAL_COMMUNITY): Payer: Medicare Other | Attending: Internal Medicine

## 2024-01-26 DIAGNOSIS — I351 Nonrheumatic aortic (valve) insufficiency: Secondary | ICD-10-CM | POA: Diagnosis not present

## 2024-01-26 DIAGNOSIS — I480 Paroxysmal atrial fibrillation: Secondary | ICD-10-CM

## 2024-01-26 DIAGNOSIS — I3139 Other pericardial effusion (noninflammatory): Secondary | ICD-10-CM

## 2024-01-26 DIAGNOSIS — I5032 Chronic diastolic (congestive) heart failure: Secondary | ICD-10-CM

## 2024-01-26 DIAGNOSIS — I10 Essential (primary) hypertension: Secondary | ICD-10-CM | POA: Diagnosis not present

## 2024-01-28 ENCOUNTER — Telehealth: Payer: Self-pay | Admitting: Internal Medicine

## 2024-01-28 DIAGNOSIS — R1319 Other dysphagia: Secondary | ICD-10-CM

## 2024-01-28 LAB — ECHOCARDIOGRAM COMPLETE
Area-P 1/2: 3.5 cm2
P 1/2 time: 307 ms
S' Lateral: 2.4 cm

## 2024-01-28 NOTE — Addendum Note (Signed)
 Addended by: Quentin Mulling on: 01/28/2024 01:30 PM   Modules accepted: Orders

## 2024-01-28 NOTE — Telephone Encounter (Signed)
 Lm on vm for patient to return call

## 2024-01-28 NOTE — Telephone Encounter (Signed)
 Patient has appointment with myself 02/10/2024 being evaluated for dysphagia prior to potential TEE and LHC/RHC for severe AI with hold diastolic flow reversal without LV dilation. Please call patient and see if she is willing to complete a barium swallow  prior to her appointment, we will be able to review this during her OV and give better recommendations, if patient does have a stricture issue can consider doing TEE and endoscopy at [facility] in coordination with cardiology.

## 2024-01-28 NOTE — Telephone Encounter (Signed)
 Called and spoke with patient regarding [name]'s recommendations. Patient states that she will be in [place] from 01/31/24-02/07/24 but she is able to have the barium esophogram this week if scheduling is able to accommodate. I advised that I would have radiology scheduling reach out to her today to set up an appt. Pt verbalized understanding and had no concerns at the end of the call.  Urgent secure staff message sent to radiology scheduling to contact patient to set up appt.

## 2024-01-28 NOTE — Telephone Encounter (Signed)
 Called pt scheduled OV with Cleaver, NP on 03/12/24 at 11:20 am.

## 2024-01-28 NOTE — Telephone Encounter (Signed)
 Called patient - Asymptomatic from a cardiac standpoint - is at her sisters colonoscopy - notes rare fatigue - notes persistent dysphagia  She has severe AI with holodiastolic flow reversal without LV dilation or dysfunction - we discussed TEE and LHC/RHC for surgical eval - she has dysphagia and would be at higher risk for TEE (discussed during consent process) - schedule her f/u with me or my APP team after her GI visit, if she is cleared for TEE we will pursue this prior to SAVR  No further questions  Riley Lam, MD FASE [facility] Cardiologist [facility]  [address] [place], [place] [address] [phone]  11:20 AM

## 2024-02-06 ENCOUNTER — Other Ambulatory Visit (HOSPITAL_COMMUNITY): Payer: Medicare Other

## 2024-02-09 ENCOUNTER — Other Ambulatory Visit: Payer: Self-pay | Admitting: Physician Assistant

## 2024-02-09 ENCOUNTER — Ambulatory Visit (HOSPITAL_COMMUNITY)
Admission: RE | Admit: 2024-02-09 | Discharge: 2024-02-09 | Disposition: A | Discharge status: Home / Self Care | Source: Ambulatory Visit | Attending: Physician Assistant | Admitting: Physician Assistant

## 2024-02-09 DIAGNOSIS — R1319 Other dysphagia: Secondary | ICD-10-CM | POA: Diagnosis not present

## 2024-02-09 DIAGNOSIS — K224 Dyskinesia of esophagus: Secondary | ICD-10-CM | POA: Diagnosis not present

## 2024-02-09 DIAGNOSIS — R1310 Dysphagia, unspecified: Secondary | ICD-10-CM | POA: Diagnosis not present

## 2024-02-09 DIAGNOSIS — R130 Aphagia: Secondary | ICD-10-CM | POA: Diagnosis not present

## 2024-02-09 NOTE — Progress Notes (Signed)
 02/11/2024 [name] [id number] Sep 30, 1956  Referring provider: Elester Grim, MD Primary GI doctor: Dr. Rosaline [name] (Dr. Howard [name])  ASSESSMENT AND PLAN:   Dysphagia getting TEE and left right heart catheterization for severe AI with hold diastolic flow reversal without LV dilation 05/2021 EGD for dysphagia benign proximal esophageal stricture 22 cm dilated and a few small AVMs in the esophagus unchanged polypoid lesion stomach unchanged over 13 years 4/21 barium swallow showed dysmotility with retrograde propulsion of barium bolus into upper esophagus, areas of mild extrinsic compression at the level of aortic notch as well as posterior stomach  that corresponds with poor progression of barium bolus She has had hoarseness, feeling of upper esophageal/orophangeal dysphagia with liquids, pills and solids for a year, daily, not progressive, denies GERD, no weight loss.  CT chest normal aorta 07/2023 Has been on mounjaro since 2023, increased dose March, could be contributing to symptoms - possible dysphagia aortic - will send a message to [name] to see if an EGD is still needed and if there is a way for us  to try to coordinate a hospital endoscopy with a [facility] to only put the patient to sleep once, if not can will proceed with EGD first available at the hospital, would likely still benefit from being at [facility] with heart history - discussed mounjaro mechanism of action, add on pepcid at night, consider PPI  AI severe Getting TEE and LHC/RHC Has some DOE, leg swelling improves at night, no chest pain  CAD status post stents Follows with [name] Pending LHC/RHC  Atrial fibrillation On Eliquis  Hold Eliquis  for 2 days before procedure will instruct when and how to resume after procedure.  Patient understands that there is a low but real risk of cardiovascular event such as heart attack, stroke, or embolism /  thrombosis, or ischemia while off Eliquis   The patient  consents to proceed.  Will communicate by phone or EMR with patient's prescribing provider to confirm that holding Eliquis  is reasonable in this case.   Congestive heart failure with preserved EF 01/26/24 echo EF 65-70%, severe aortic valve regurg with holodiastolic backflow in proximal descending aorta  Type 2 diabetes insulin -dependent On Mounjaro x last year Discussed GLP1 with the patient, mechanism of action. Gastroparesis diet given to the patient.  Patient should be instructed to hold this medications if dose falls within 7 days of endoscopic procedure, due to increased risk of retained gastric contents.  Ab pain epigastric pain RUQ US  sludge in the gallbladder no stones no gallbladder wall thickening CBD 2.9 S/p cholecystectomy  Improved  Constipation On colace, no hematochezia Likely from mounjaro - Increase fiber/ water  intake, decrease caffeine , increase activity level. -Will add on Miralax  daily  Screening colonoscopy 2019 showed pandiverticulosis otherwise negative Recall 2029  Patient Care Team: [name] Grim, MD as PCP - General (Internal Medicine) [name] Pump, MD as PCP - Electrophysiology (Cardiology) [name] Melody, MD as PCP - Cardiology (Cardiology) [name] Grills, MD [name] Medford, MD (Inactive) (Gastroenterology) [name] Balls, MD (Orthopedic Surgery) [name] Cyphers, MD (Hematology and Oncology) [name] Lemme, MD (Thoracic Surgery) [name] Cap, MD (Radiation Oncology) [name] Sickle, MD as Consulting Physician (Ophthalmology) [name] Farr, MD as Consulting Physician (Endocrinology) [name] Melody, MD as Consulting Physician (Cardiology)  HISTORY OF PRESENT ILLNESS: 68 y.o. female with a past medical history listed below presents for evaluation of dysphagia, requested by cardiology for clearance for TEE for severe AI with backflow.  Discussed the use of AI scribe software for clinical note transcription  with the patient, who gave verbal consent to proceed.  History of Present Illness   [name] is a 68 year old female with esophageal cancer who presents with difficulty swallowing.  She has a history of esophageal cancer 24 years ago, which has led to ongoing issues with swallowing. An endoscopy in 2022 due to swallowing difficulties revealed a narrowing that was dilated, and a benign lesion was found. Recently, she has been experiencing persistent difficulty swallowing, particularly with solid foods like meat, which she has to chew thoroughly and swallow in small pieces. She describes a sensation of food or pills getting stuck in her throat, sometimes requiring her to massage her throat to help it pass. This issue has been ongoing for about a year and occurs daily. No heartburn or acid reflux.   She is currently on Eliquis  and Mounjaro, the latter of which was increased in March 2025 from 5 to 7.5 mg. She reports no recent weight loss.  She experiences shortness of breath when walking and occasional swelling in her ankles, no chest pain.  She reports constipation and takes a stool softener daily. She experienced epigastric pain in the past, for which she was prescribed medication by Dr. Brenita [name]. She denies any blood in her stool but notes that Pepto-Bismol has caused her stool to appear dark.      She  reports that she quit smoking about 24 years ago. Her smoking use included cigarettes. She started smoking about 29 years ago. She has a 1.3 pack-year smoking history. She has never used smokeless tobacco. She reports that she does not drink alcohol and does not use drugs.  RELEVANT GI HISTORY, IMAGING AND LABS: Results   RADIOLOGY CT chest: Normal aorta (07/2023)  DIAGNOSTIC Endoscopy: Narrowing, dilated superficial blood vessels, benign lesion (2022) Echocardiogram: Mitral valve regurgitation (09/2023) Barium swallow: Dysmotility, mild extrinsic compression at aortic notch (02/09/2024)       CBC    Component Value Date/Time   WBC 5.8 06/21/2023 1827   RBC 3.72 (L) 06/21/2023 1827   HGB 11.2 (L) 06/21/2023 1827   HGB 11.2 01/30/2021 1036   HGB 12.1 01/09/2015 1109   HCT 35.3 (L) 06/21/2023 1827   HCT 33.8 (L) 01/30/2021 1036   HCT 36.9 01/09/2015 1109   PLT 290 06/21/2023 1827   PLT 292 01/30/2021 1036   MCV 94.9 06/21/2023 1827   MCV 98 (H) 01/30/2021 1036   MCV 93.4 01/09/2015 1109   MCH 30.1 06/21/2023 1827   MCHC 31.7 06/21/2023 1827   RDW 12.3 06/21/2023 1827   RDW 11.9 01/30/2021 1036   RDW 12.2 01/09/2015 1109   LYMPHSABS 1.7 04/17/2023 0343   LYMPHSABS 1.9 06/20/2017 1057   LYMPHSABS 1.8 01/09/2015 1109   MONOABS 1.3 (H) 04/17/2023 0343   MONOABS 0.4 01/09/2015 1109   EOSABS 0.0 04/17/2023 0343   EOSABS 0.3 06/20/2017 1057   BASOSABS 0.0 04/17/2023 0343   BASOSABS 0.0 06/20/2017 1057   BASOSABS 0.0 01/09/2015 1109   Recent Labs    04/01/23 1103 04/15/23 0338 04/16/23 0344 04/17/23 0343 06/21/23 1827  HGB 11.7* 8.8* 7.8* 7.2* 11.2*    CMP     Component Value Date/Time   NA 141 06/21/2023 1827   NA 141 03/05/2021 1502   NA 144 01/09/2015 1109   K 4.6 06/21/2023 1827   K 4.2 01/09/2015 1109   CL 103 06/21/2023 1827   CL 105 01/19/2013 1126  CO2 28 06/21/2023 1827   CO2 28 01/09/2015 1109   GLUCOSE 160 (H) 06/21/2023 1827   GLUCOSE 71 01/09/2015 1109   GLUCOSE 180 (H) 01/19/2013 1126   BUN 17 06/21/2023 1827   BUN 25 03/05/2021 1502   BUN 13.7 01/09/2015 1109   CREATININE 0.90 06/21/2023 1827   CREATININE 0.90 08/04/2015 1127   CREATININE 0.8 01/09/2015 1109   CALCIUM  9.0 06/21/2023 1827   CALCIUM  9.2 01/09/2015 1109   PROT 6.9 06/21/2023 1827   PROT 6.9 03/05/2021 1502   PROT 7.1 01/09/2015 1109   ALBUMIN 3.3 (L) 06/21/2023 1827   ALBUMIN 4.2 03/05/2021 1502   ALBUMIN 3.5 01/09/2015 1109   AST 13 (L) 06/21/2023 1827   AST 16 01/09/2015 1109   ALT 9 06/21/2023 1827   ALT 16 01/09/2015 1109   ALKPHOS 102 06/21/2023  1827   ALKPHOS 110 01/09/2015 1109   BILITOT 0.6 06/21/2023 1827   BILITOT 0.3 03/05/2021 1502   BILITOT 0.32 01/09/2015 1109   GFRNONAA >60 06/21/2023 1827   GFRNONAA 85 04/18/2015 1451   GFRAA 58.36 09/08/2020 0000   GFRAA >89 04/18/2015 1451      Latest Ref Rng & Units 06/21/2023    6:27 PM 08/06/2022   10:14 AM 04/25/2021    4:14 PM  Hepatic Function  Total Protein 6.5 - 8.1 g/dL 6.9  6.8    Albumin 3.5 - 5.0 g/dL 3.3  3.8    AST 15 - 41 U/L 13  10    ALT 0 - 44 U/L 9  10  9    Alk Phosphatase 38 - 126 U/L 102  105    Total Bilirubin 0.3 - 1.2 mg/dL 0.6  0.4        Current Medications:   Current Outpatient Medications (Endocrine & Metabolic):    levothyroxine (SYNTHROID, LEVOTHROID) 50 MCG tablet, Take 50 mcg by mouth daily before breakfast.    MOUNJARO 5 MG/0.5ML Pen, Inject 5 mg into the skin once a week.   NOVOLOG  MIX 70/30 FLEXPEN (70-30) 100 UNIT/ML FlexPen, Inject 30 Units into the skin daily with breakfast. Also, 35 units in the evening  Current Outpatient Medications (Cardiovascular):    diltiazem  (CARDIZEM  CD) 360 MG 24 hr capsule, Take 1 capsule (360 mg total) by mouth daily.   furosemide  (LASIX ) 40 MG tablet, Take 1 tablet (40 mg total) by mouth daily as needed for fluid or edema.   metoprolol  succinate (TOPROL -XL) 50 MG 24 hr tablet, TAKE 1.5 TABLETS (75 MG TOTAL) BY MOUTH AT BEDTIME. TAKE WITH OR IMMEDIATELY FOLLOWING A MEAL.   nitroGLYCERIN  (NITROSTAT ) 0.4 MG SL tablet, Place 0.4 mg under the tongue every 5 (five) minutes as needed for chest pain.   rosuvastatin  (CRESTOR ) 20 MG tablet, Take 20 mg by mouth daily.   spironolactone  (ALDACTONE ) 25 MG tablet, TAKE 1 TABLET (25 MG TOTAL) BY MOUTH DAILY.   Current Outpatient Medications (Analgesics):    acetaminophen  (TYLENOL ) 650 MG CR tablet, Take 1,300 mg by mouth every 8 (eight) hours as needed for pain (headache).  Current Outpatient Medications (Hematological):    ELIQUIS  5 MG TABS tablet, TAKE 1 TABLET BY  MOUTH TWICE A DAY  Current Outpatient Medications (Other):    ACCU-CHEK GUIDE test strip,    Accu-Chek Softclix Lancets lancets, USE LANCETS 3 TIMES A DAY AS DIRECTED 30 DAYS   B-D UF III MINI PEN NEEDLES 31G X 5 MM MISC, 2 (two) times daily. as directed   Blood Glucose Monitoring Suppl (ACCU-CHEK  GUIDE) w/Device KIT, See admin instructions.   co-enzyme Q-10 50 MG capsule, TAKE 1 CAPSULE BY MOUTH EVERY DAY  Medical History:  Past Medical History:  Diagnosis Date   Allergy    Anemia    Aortic insufficiency    a. Mod by echo 05/2015.   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 Abnormal stress test -> LHc 06/2015 s/p DES to mCX and mLAD, residual D2 disease treated medially.   Diverticulitis    Erythema nodosum    Heart murmur    History of kidney stones    Hypertension    Insomnia    lung ca dx'd 10/1999   chemo/xrt comp 05/29/2000   Myalgia    Obesity    Pericardial effusion    a. 05/2015 Echo: EF 55-60%, no rwma, Gr 1 DD, no effusion - but an effusion was seen on CT which was felt to be increased in size compared to 12/2014 - pericardium also thickened.    Pericarditis    a. Dx 05/2015.   Pneumothorax, spontaneous, tension 01/20/2012   October, 2010   PONV (postoperative nausea and vomiting)    Pulmonary fibrosis (HCC) 01/20/2012   Due to previous surgery and chest radiation for lung cancer   Type II diabetes mellitus (HCC)    Allergies:  Allergies  Allergen Reactions   Hydrocodone  Hives and Itching   Morphine  And Codeine  Other (See Comments)    GI PROBLEMS   Crestor  [Rosuvastatin ] Other (See Comments)    Pt reports causes lower extremity muscle aches/cramping   Invokana [Canagliflozin] Diarrhea   Metformin Hcl Diarrhea   Lisinopril  Cough   Nsaids  Other (See Comments)    GI PROBLEMS     Surgical History:  She  has a past surgical history that includes Lung cancer surgery (Left, 10/22/1999); Shoulder arthroscopy w/ rotator cuff repair (Right, ~ 2003); Inner ear surgery (Right, ~ 2009); Abdominal hysterectomy (10/21/2006); Colonoscopy; Knee Arthroplasty (Right, 10/21/1989); Carpal tunnel release (Bilateral, 1998-2005?); Pulmonary embolism surgery (10/21/2005); Esophagogastroduodenoscopy (egd) with esophageal dilation (10/21/2010); Knee arthroscopy (~ 2000); Foot surgery (Bilateral, 06/22/1979); Cardiac catheterization (N/A, 07/11/2015); Cardiac catheterization (07/11/2015); Appendectomy (1969?); Shoulder adhesion release (Right, ~ 2004); Lung surgery (Right, 10/21/2005); Cystoscopy w/ stone manipulation (10/22/2011); Carpal tunnel release (Left, 2009?); Coronary angioplasty; LEFT HEART CATH AND CORONARY ANGIOGRAPHY (N/A, 12/18/2016); RIGHT AND LEFT HEART CATH (N/A, 06/25/2017); Colonoscopy with propofol  (N/A, 05/28/2018); LEFT HEART CATH AND CORONARY ANGIOGRAPHY (N/A, 02/01/2021); Upper gastrointestinal endoscopy (06/05/2021); Video bronchoscopy (N/A, 08/03/2021); Bronchial washings (08/03/2021); ATRIAL FIBRILLATION ABLATION (N/A, 02/27/2022); Cholecystectomy (N/A, 10/01/2022); and Total knee arthroplasty (Left, 04/14/2023). Family History:  Her family history includes Diabetes in her father, maternal grandmother, mother, paternal grandmother, sister, sister, sister, and sister; Heart disease in her father; Hypertension in her sister; Kidney disease in her sister; Pancreatic cancer in her mother.  REVIEW OF SYSTEMS  : All other systems reviewed and negative except where noted in the History of Present Illness.  PHYSICAL EXAM: BP 132/76   Pulse 67   Ht 5' 7.5" (1.715 m)   Wt 225 lb 6.4 oz (102.2 kg)   SpO2 98%   BMI 34.78 kg/m  Physical Exam   GENERAL APPEARANCE: Well nourished, in no apparent distress. HEENT: No cervical lymphadenopathy,  unremarkable thyroid ,  sclerae anicteric, conjunctiva pink. RESPIRATORY: Respiratory effort normal, breath sounds clear to auscultation bilaterally without rales, rhonchi, or wheezing. CARDIO: Regular rate and rhythm with a holosystolic murmur present, peripheral pulses intact. ABDOMEN: Soft, non-distended, active bowel sounds in all four quadrants, epigastric tenderness present, no rebound, no mass appreciated. RECTAL: Declines. MUSCULOSKELETAL: Full range of motion, normal gait, without edema. SKIN: Dry, intact without rashes or lesions. No jaundice. NEURO: Alert, oriented, no focal deficits. PSYCH: Cooperative, normal mood and affect. NECK: Neck supple, no lymphadenopathy, thyroid  normal.      Edmonia Gottron, PA-C 8:20 AM

## 2024-02-10 ENCOUNTER — Encounter: Payer: Self-pay | Admitting: Physician Assistant

## 2024-02-10 ENCOUNTER — Ambulatory Visit: Admitting: Physician Assistant

## 2024-02-10 VITALS — BP 132/76 | HR 67 | Ht 67.5 in | Wt 225.4 lb

## 2024-02-10 DIAGNOSIS — E119 Type 2 diabetes mellitus without complications: Secondary | ICD-10-CM | POA: Diagnosis not present

## 2024-02-10 DIAGNOSIS — R1013 Epigastric pain: Secondary | ICD-10-CM

## 2024-02-10 DIAGNOSIS — R1310 Dysphagia, unspecified: Secondary | ICD-10-CM | POA: Diagnosis not present

## 2024-02-10 DIAGNOSIS — Z7901 Long term (current) use of anticoagulants: Secondary | ICD-10-CM

## 2024-02-10 DIAGNOSIS — N183 Chronic kidney disease, stage 3 unspecified: Secondary | ICD-10-CM

## 2024-02-10 DIAGNOSIS — I480 Paroxysmal atrial fibrillation: Secondary | ICD-10-CM

## 2024-02-10 DIAGNOSIS — I4891 Unspecified atrial fibrillation: Secondary | ICD-10-CM

## 2024-02-10 DIAGNOSIS — I5032 Chronic diastolic (congestive) heart failure: Secondary | ICD-10-CM

## 2024-02-10 DIAGNOSIS — K5900 Constipation, unspecified: Secondary | ICD-10-CM

## 2024-02-10 DIAGNOSIS — Z794 Long term (current) use of insulin: Secondary | ICD-10-CM

## 2024-02-10 DIAGNOSIS — Z860101 Personal history of adenomatous and serrated colon polyps: Secondary | ICD-10-CM

## 2024-02-10 DIAGNOSIS — I351 Nonrheumatic aortic (valve) insufficiency: Secondary | ICD-10-CM

## 2024-02-10 DIAGNOSIS — R1314 Dysphagia, pharyngoesophageal phase: Secondary | ICD-10-CM

## 2024-02-10 NOTE — Patient Instructions (Addendum)
 _______________________________________________________  If your blood pressure at your visit was 140/90 or greater, please contact your primary care physician to follow up on this. _______________________________________________________  If you are age 66 or older, your body mass index should be between 23-30. Your Body mass index is 34.78 kg/m. If this is out of the aforementioned range listed, please consider follow up with your Primary Care Provider. ________________________________________________________  The [facility] providers would like to encourage you to use MYCHART to communicate with providers for non-urgent requests or questions.  Due to long hold times on the telephone, sending your provider a message by [facility] may be a faster and more efficient way to get a response.  Please allow 48 business hours for a response.  Please remember that this is for non-urgent requests.  _______________________________________________________  Miralax  is an osmotic laxative.  It only brings more water  into the stool.  This is safe to take daily.  Can take up to 17 gram of miralax  twice a day.  Mix with juice or coffee.  Start 1 capful at night for 3-4 days and reassess your response in 3-4 days.  You can increase and decrease the dose based on your response.  Remember, it can take up to 3-4 days to take effect OR for the effects to wear off.   I often pair this with benefiber in the morning to help assure the stool is not too loose.   Toileting tips to help with your constipation - Drink at least 64-80 ounces of water /liquid per day. - Establish a time to try to move your bowels every day.  For many people, this is after a cup of coffee or after a meal such as breakfast. - Sit all of the way back on the toilet keeping your back fairly straight and while sitting up, try to rest the tops of your forearms on your upper thighs.   - Raising your feet with a step stool/squatty potty can be  helpful to improve the angle that allows your stool to pass through the rectum. - Relax the rectum feeling it bulge toward the toilet water .  If you feel your rectum raising toward your body, you are contracting rather than relaxing. - Breathe in and slowly exhale. "Belly breath" by expanding your belly towards your belly button. Keep belly expanded as you gently direct pressure down and back to the anus.  A low pitched GRRR sound can assist with increasing intra-abdominal pressure.  (Can also trying to blow on a pinwheel and make it move, this helps with the same belly breathing) - Repeat 3-4 times. If unsuccessful, contract the pelvic floor to restore normal tone and get off the toilet.  Avoid excessive straining. - To reduce excessive wiping by teaching your anus to normally contract, place hands on outer aspect of knees and resist knee movement outward.  Hold 5-10 second then place hands just inside of knees and resist inward movement of knees.  Hold 5 seconds.  Repeat a few times each way.  Go to the ER if unable to pass gas, severe AB pain, unable to hold down food, any shortness of breath of chest pain.  Diverticulosis Diverticulosis is a condition that develops when small pouches (diverticula) form in the wall of the large intestine (colon). The colon is where water  is absorbed and stool (feces) is formed. The pouches form when the inside layer of the colon pushes through weak spots in the outer layers of the colon. You may have a few  pouches or many of them. The pouches usually do not cause problems unless they become inflamed or infected. When this happens, the condition is called diverticulitis- this is left lower quadrant pain, diarrhea, fever, chills, nausea or vomiting.  If this occurs please call the office or go to the hospital. Sometimes these patches without inflammation can also have painless bleeding associated with them, if this happens please call the office or go to the  hospital. Preventing constipation and increasing fiber can help reduce diverticula and prevent complications. Even if you feel you have a high-fiber diet, suggest getting on Benefiber or Cirtracel 2 times daily.  Behavioral and Dietary Strategies for Management of Esophageal Dysmotility/dysphagia 1. Take reflux medications 30+ minutes before food in the morning.  2. Begin meals with warm beverage 3. Eat smaller more frequent meals 4. Eat slowly, taking small bites and sips 5. Alternate solids and liquids 6. Avoid foods/liquids that increase acid production 7. Sit upright during and for 30+ minutes after meals to facilitate esophageal clearing 8. Can try altoid melting in mouth before food  Thank you for entrusting me with your care and choosing [facility].  Santina Cull, PA-C

## 2024-02-11 NOTE — Progress Notes (Signed)
 I agree with the assessment and plan as outlined by Ms. Steffanie Dunn.

## 2024-02-16 ENCOUNTER — Other Ambulatory Visit: Payer: Self-pay | Admitting: Internal Medicine

## 2024-02-16 DIAGNOSIS — Z1231 Encounter for screening mammogram for malignant neoplasm of breast: Secondary | ICD-10-CM

## 2024-02-19 DIAGNOSIS — R109 Unspecified abdominal pain: Secondary | ICD-10-CM | POA: Diagnosis not present

## 2024-02-19 DIAGNOSIS — R1319 Other dysphagia: Secondary | ICD-10-CM | POA: Diagnosis not present

## 2024-02-19 DIAGNOSIS — I351 Nonrheumatic aortic (valve) insufficiency: Secondary | ICD-10-CM | POA: Diagnosis not present

## 2024-02-19 DIAGNOSIS — E039 Hypothyroidism, unspecified: Secondary | ICD-10-CM | POA: Diagnosis not present

## 2024-02-19 DIAGNOSIS — E782 Mixed hyperlipidemia: Secondary | ICD-10-CM | POA: Diagnosis not present

## 2024-02-19 DIAGNOSIS — I10 Essential (primary) hypertension: Secondary | ICD-10-CM | POA: Diagnosis not present

## 2024-02-19 DIAGNOSIS — I2510 Atherosclerotic heart disease of native coronary artery without angina pectoris: Secondary | ICD-10-CM | POA: Diagnosis not present

## 2024-02-19 DIAGNOSIS — E1165 Type 2 diabetes mellitus with hyperglycemia: Secondary | ICD-10-CM | POA: Diagnosis not present

## 2024-02-19 DIAGNOSIS — I480 Paroxysmal atrial fibrillation: Secondary | ICD-10-CM | POA: Diagnosis not present

## 2024-02-27 ENCOUNTER — Other Ambulatory Visit: Payer: Self-pay | Admitting: Cardiology

## 2024-03-10 NOTE — Progress Notes (Signed)
 Cardiology Clinic Note   Patient Name: [name] Date of Encounter: 03/12/2024  Primary Care Provider:  Elester Grim, MD Primary Cardiologist:  Jann Melody, MD  Patient Profile    [name] 68 year old female presents to the clinic today for evaluation of her persistent dysphagia and pre-SAVR evaluation.  Past Medical History    Past Medical History:  Diagnosis Date   Allergy    Anemia    Aortic insufficiency    a. Mod by echo 05/2015.   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 Abnormal stress test -> LHc 06/2015 s/p DES to mCX and mLAD, residual D2 disease treated medially.   Diverticulitis    Erythema nodosum    Heart murmur    History of kidney stones    Hypertension    Insomnia    lung ca dx'd 10/1999   chemo/xrt comp 05/29/2000   Myalgia    Obesity    Pericardial effusion    a. 05/2015 Echo: EF 55-60%, no rwma, Gr 1 DD, no effusion - but an effusion was seen on CT which was felt to be increased in size compared to 12/2014 - pericardium also thickened.    Pericarditis    a. Dx 05/2015.   Pneumothorax, spontaneous, tension 01/20/2012   October, 2010   PONV (postoperative nausea and vomiting)    Pulmonary fibrosis (HCC) 01/20/2012   Due to previous surgery and chest radiation for lung cancer   Type II diabetes mellitus [facility])    Past Surgical History:  Procedure Laterality Date   ABDOMINAL HYSTERECTOMY  10/21/2006   APPENDECTOMY  1969?   ATRIAL FIBRILLATION ABLATION N/A 02/27/2022   Procedure: ATRIAL FIBRILLATION ABLATION;  Surgeon: Lei Pump, MD;  Location: [facility] INVASIVE CV LAB;  Service: Cardiovascular;  Laterality: N/A;   BRONCHIAL WASHINGS  08/03/2021   Procedure: BRONCHIAL WASHINGS;  Surgeon: Lind Repine, MD;  Location: [facility] ENDOSCOPY;  Service: Cardiopulmonary;;   CARDIAC CATHETERIZATION N/A 07/11/2015   Procedure: Left Heart Cath and Coronary Angiography;  Surgeon: Lucendia Rusk, MD;  Location: [facility] INVASIVE CV LAB;  Service: Cardiovascular;  Laterality: N/A;   CARDIAC CATHETERIZATION  07/11/2015   Procedure: Coronary Stent Intervention;  Surgeon: Lucendia Rusk, MD;  Location: [facility] INVASIVE CV LAB;  Service: Cardiovascular;;   CARPAL TUNNEL RELEASE Bilateral 1998-2005?   right-left   CARPAL TUNNEL RELEASE Left 2009?   "for the 2nd time"   CHOLECYSTECTOMY N/A 10/01/2022   Procedure: LAPAROSCOPIC CHOLECYSTECTOMY;  Surgeon: Adalberto Acton, MD;  Location: [facility] ORS;  Service: General;  Laterality: N/A;   COLONOSCOPY     COLONOSCOPY WITH PROPOFOL  N/A 05/28/2018   Procedure: COLONOSCOPY WITH PROPOFOL ;  Surgeon: Janel Medford, MD;  Location: [facility] ENDOSCOPY;  Service: Endoscopy;  Laterality: N/A;   CORONARY ANGIOPLASTY     CYSTOSCOPY W/ STONE MANIPULATION  10/22/2011   ESOPHAGOGASTRODUODENOSCOPY (EGD) WITH ESOPHAGEAL DILATION  10/21/2010   FOOT SURGERY Bilateral 06/22/1979   Callous removed    INNER EAR SURGERY Right ~ 2009   KNEE ARTHROPLASTY Right 10/21/1989   KNEE ARTHROSCOPY  ~ 2000   LATERAL RELEASE   LEFT HEART CATH AND CORONARY ANGIOGRAPHY N/A 12/18/2016   Procedure: Left Heart Cath and Coronary  Angiography;  Surgeon: Odie Benne, MD;  Location: [facility] INVASIVE CV LAB;  Service: Cardiovascular;  Laterality: N/A;   LEFT HEART CATH AND CORONARY ANGIOGRAPHY N/A 02/01/2021   Procedure: LEFT HEART CATH AND CORONARY ANGIOGRAPHY;  Surgeon: Arleen Lacer, MD;  Location: [facility] INVASIVE CV LAB;  Service: Cardiovascular;  Laterality: N/A;   LUNG CANCER SURGERY Left 10/22/1999   "small cell"   LUNG SURGERY Right 10/21/2005   "reinflated it"   PULMONARY EMBOLISM SURGERY  10/21/2005   LUNG COLLAPSE   RIGHT AND LEFT HEART CATH N/A 06/25/2017   Procedure: RIGHT AND LEFT HEART CATH;   Surgeon: Arnoldo Lapping, MD;  Location: [facility] INVASIVE CV LAB;  Service: Cardiovascular;  Laterality: N/A;   SHOULDER ADHESION RELEASE Right ~ 2004   SHOULDER ARTHROSCOPY W/ ROTATOR CUFF REPAIR Right ~ 2003   TOTAL KNEE ARTHROPLASTY Left 04/14/2023   Procedure: TOTAL KNEE ARTHROPLASTY;  Surgeon: Neil Balls, MD;  Location: [facility] ORS;  Service: Orthopedics;  Laterality: Left;   UPPER GASTROINTESTINAL ENDOSCOPY  06/05/2021   VIDEO BRONCHOSCOPY N/A 08/03/2021   Procedure: VIDEO BRONCHOSCOPY WITHOUT FLUORO;  Surgeon: Lind Repine, MD;  Location: [facility] ENDOSCOPY;  Service: Cardiopulmonary;  Laterality: N/A;    Allergies  Allergies  Allergen Reactions   Hydrocodone  Hives and Itching   Morphine  And Codeine  Other (See Comments)    GI PROBLEMS   Canagliflozin Diarrhea    Other Reaction(s): diarrhea   Metformin Hcl Diarrhea   Rosuvastatin  Other (See Comments)    Pt reports causes lower extremity muscle aches/cramping  Other Reaction(s): muscle cramps   Lisinopril  Cough    Other Reaction(s): cough   Nsaids Other (See Comments)    GI PROBLEMS  Other Reaction(s): GI problems    History of Present Illness    [name] has a PMH of coronary artery disease (PCI with DES to mid circumflex and mid LAD 9/16.  She underwent subsequent cardiac catheterization 2/18 and was noted to have patent stents), chronic pericardial effusion, chronic pericarditis, pericardial rub, (intermittently treated with steroids and colchicine ), chronic CHF, diabetes, HTN, COPD, pulmonary fibrosis, atrial fibrillation, and mild AI.  Echo cardiogram 02/05/2023 showed an LVEF of 55-60%, intermediate diastolic parameters, normal mitral valve, moderate aortic valve regurgitation with no aortic stenosis and a peak gradient of 22 mmHg.  She is post ablation and saw Dr. Lawana Pray 8/23.  She denied.  Current episodes of atrial fibrillation.  At that time she wanted to discontinue Tikosyn  and it was stopped.  She followed up with  Mertha Abrahams PA-C on 12/11/2023.  She was doing well at that time.  She denied chest pain shortness of breath.  She had no new symptoms that were not related to atrial fibrillation.  She did note lightheadedness with standing or after rising from a bent position.  She was encouraged to increase her p.o. hydration.  Of note she is not a candidate for amiodarone due to known pulmonary fibrosis.  She was contacted by Dr. Paulita Boss on 01/28/2024.  She reported rare fatigue and persistent dysphagia.  TEE and LHC/RHC for surgical evaluation were discussed.  She presents to the clinic today for evaluation and states she notices increased fatigue with daily activities such as making her bed and taking shower.  She has not been able to perform much physical activity because of this.  She also notes intermittent episodes of dizziness that last for several minutes and dissipate without intervention.  We reviewed her recent echocardiogram and her aortic stenosis.  She  presents with her sister.  They expressed understanding.  Her blood pressure today is 110/50.  Her EKG shows sinus rhythm 72 bpm.  I will order right and left heart cath, TEE, CBC, BMP and plan follow-up as scheduled after testing.  Today she denies  lower extremity edema, fatigue, palpitations, melena, hematuria, hemoptysis, diaphoresis,  presyncope, syncope, orthopnea, and PND.    Home Medications    Prior to Admission medications   Medication Sig Start Date End Date Taking? Authorizing Provider  ACCU-CHEK GUIDE test strip  05/23/22   [provider]  Accu-Chek Softclix Lancets lancets USE LANCETS 3 TIMES A DAY AS DIRECTED 30 DAYS 11/12/19   [provider]  acetaminophen  (TYLENOL ) 650 MG CR tablet Take 1,300 mg by mouth every 8 (eight) hours as needed for pain (headache).    [provider]  B-D UF III MINI PEN NEEDLES 31G X 5 MM MISC 2 (two) times daily. as directed 10/31/18   [provider]  Blood Glucose  Monitoring Suppl (ACCU-CHEK GUIDE) w/Device KIT See admin instructions. 11/12/19   [provider]  co-enzyme Q-10 50 MG capsule TAKE 1 CAPSULE BY MOUTH EVERY DAY 11/30/19   Liza Riggers, MD  diltiazem  (CARDIZEM  CD) 360 MG 24 hr capsule Take 1 capsule (360 mg total) by mouth daily. 09/23/23   Camnitz, Babetta Lesch, MD  ELIQUIS  5 MG TABS tablet TAKE 1 TABLET BY MOUTH TWICE A DAY 12/08/23   Camnitz, Babetta Lesch, MD  furosemide  (LASIX ) 40 MG tablet Take 1 tablet (40 mg total) by mouth daily as needed for fluid or edema. 03/15/21   Arty Binning, MD  levothyroxine (SYNTHROID, LEVOTHROID) 50 MCG tablet Take 50 mcg by mouth daily before breakfast.  05/14/17   [provider]  metoprolol  succinate (TOPROL -XL) 50 MG 24 hr tablet TAKE 1.5 TABLETS (75 MG TOTAL) BY MOUTH AT BEDTIME. TAKE WITH OR IMMEDIATELY FOLLOWING A MEAL. 02/27/24   Camnitz, Babetta Lesch, MD  [facility] 5 MG/0.5ML Pen Inject 5 mg into the skin once a week. 09/29/23   [provider]  nitroGLYCERIN  (NITROSTAT ) 0.4 MG SL tablet Place 0.4 mg under the tongue every 5 (five) minutes as needed for chest pain.    [provider]  NOVOLOG  MIX 70/30 FLEXPEN (70-30) 100 UNIT/ML FlexPen Inject 30 Units into the skin daily with breakfast. Also, 35 units in the evening 04/22/22   [provider]  rosuvastatin  (CRESTOR ) 20 MG tablet Take 20 mg by mouth daily. 03/06/23   [provider]  spironolactone  (ALDACTONE ) 25 MG tablet TAKE 1 TABLET (25 MG TOTAL) BY MOUTH DAILY. 07/23/22   Tylene Galla, PA-C    Family History    Family History  Problem Relation Age of Onset   Heart disease Father    Diabetes Father    Pancreatic cancer Mother    Diabetes Mother    Hypertension Sister        x 3   Diabetes Sister        x 4   Diabetes Maternal Grandmother    Diabetes Paternal Grandmother    Diabetes Sister    Diabetes Sister    Diabetes Sister    Kidney disease Sister    Colon cancer Neg Hx     Colon polyps Neg Hx    Rectal cancer Neg Hx    Stomach cancer Neg Hx    Esophageal cancer Neg Hx    Breast cancer Neg Hx    She indicated that  her mother is deceased. She indicated that her father is deceased. She indicated that five of her six sisters are alive. She indicated that her brother is alive. She indicated that her maternal grandmother is deceased. She indicated that her maternal grandfather is deceased. She indicated that her paternal grandmother is deceased. She indicated that her paternal grandfather is deceased. She indicated that the status of her neg hx is unknown.  Social History    Social History   Socioeconomic History   Marital status: Single    Spouse name: Not on file   Number of children: 0   Years of education: Not on file   Highest education level: Not on file  Occupational History   Occupation: RETIRED  Tobacco Use   Smoking status: Former    Current packs/day: 0.00    Average packs/day: 0.3 packs/day for 5.0 years (1.3 ttl pk-yrs)    Types: Cigarettes    Start date: 07/21/1994    Quit date: 07/22/1999    Years since quitting: 24.6   Smokeless tobacco: Never   Tobacco comments:    Former smoker 11/28/22  Vaping Use   Vaping status: Never Used  Substance and Sexual Activity   Alcohol use: No    Alcohol/week: 0.0 standard drinks of alcohol   Drug use: No   Sexual activity: Yes    Birth control/protection: Other-see comments    Comment: hysterectomy  Other Topics Concern   Not on file  Social History Narrative   Not on file   Social Drivers of Health   Financial Resource Strain: Low Risk  (07/12/2021)   Overall Financial Resource Strain (CARDIA)    Difficulty of Paying Living Expenses: Not hard at all  Food Insecurity: No Food Insecurity (04/14/2023)   Hunger Vital Sign    Worried About Running Out of Food in the Last Year: Never true    Ran Out of Food in the Last Year: Never true  Transportation Needs: No Transportation Needs (04/14/2023)    PRAPARE - Administrator, Civil Service (Medical): No    Lack of Transportation (Non-Medical): No  Physical Activity: Inactive (07/12/2021)   Exercise Vital Sign    Days of Exercise per Week: 0 days    Minutes of Exercise per Session: 0 min  Stress: No Stress Concern Present (07/12/2021)   [employer] of Occupational Health - Occupational Stress Questionnaire    Feeling of Stress : Not at all  Social Connections: Not on file  Intimate Partner Violence: Not At Risk (04/14/2023)   Humiliation, Afraid, Rape, and Kick questionnaire    Fear of Current or Ex-Partner: No    Emotionally Abused: No    Physically Abused: No    Sexually Abused: No     Review of Systems    General:  No chills, fever, night sweats or weight changes.  Cardiovascular:  No chest pain, dyspnea on exertion, edema, orthopnea, palpitations, paroxysmal nocturnal dyspnea. Dermatological: No rash, lesions/masses Respiratory: No cough, dyspnea Urologic: No hematuria, dysuria Abdominal:   No nausea, vomiting, diarrhea, bright red blood per rectum, melena, or hematemesis Neurologic:  No visual changes, wkns, changes in mental status. All other systems reviewed and are otherwise negative except as noted above.  Physical Exam    VS:  BP (!) 110/50   Pulse 72   Ht 5' 7.5" (1.715 m)   Wt 218 lb 6.4 oz (99.1 kg)   SpO2 98%   BMI 33.70 kg/m  , BMI Body mass index is  33.7 kg/m. GEN: Well nourished, well developed, in no acute distress. HEENT: normal. Neck: Supple, no JVD, carotid bruits, or masses. Cardiac: RRR, 3-4 out of 6 systolic murmur heard along right sternal border, rubs, or gallops. No clubbing, cyanosis, edema.  Radials/DP/PT 2+ and equal bilaterally.  Respiratory:  Respirations regular and unlabored, clear to auscultation bilaterally. GI: Soft, nontender, nondistended, BS + x 4. MS: no deformity or atrophy. Skin: warm and dry, no rash. Neuro:  Strength and sensation are intact. Psych: Normal  affect.  Accessory Clinical Findings    Recent Labs: 06/21/2023: ALT 9; BUN 17; Creatinine, Ser 0.90; Hemoglobin 11.2; Platelets 290; Potassium 4.6; Sodium 141   Recent Lipid Panel    Component Value Date/Time   CHOL 127 11/27/2021 0408   CHOL 130 04/25/2021 1614   TRIG 72 11/27/2021 0408   HDL 44 11/27/2021 0408   HDL 44 04/25/2021 1614   CHOLHDL 2.9 11/27/2021 0408   VLDL 14 11/27/2021 0408   LDLCALC 69 11/27/2021 0408   LDLCALC 67 04/25/2021 1614         ECG personally reviewed by me today- EKG Interpretation Date/Time:  Friday Mar 12 2024 11:19:07 EDT Ventricular Rate:  72 PR Interval:  124 QRS Duration:  96 QT Interval:  418 QTC Calculation: 457 R Axis:   74  Text Interpretation: Normal sinus rhythm Normal ECG When compared with ECG of 29-May-2023 14:08, No significant change was found Confirmed by Lawana Pray [phone]) on 03/12/2024 11:24:35 AM    Echocardiogram 01/26/2024  IMPRESSIONS     1. The aortic valve is tricuspid. Aortic valve regurgitation is severe.  Holodiastolic backflow seen in proximal descending aorta   2. Left ventricular ejection fraction, by estimation, is 65 to 70%. The  left ventricle has normal function. The left ventricle has no regional  wall motion abnormalities. Left ventricular diastolic parameters were  normal.   3. Right ventricular systolic function is normal. The right ventricular  size is normal.   FINDINGS   Left Ventricle: Left ventricular ejection fraction, by estimation, is 65  to 70%. The left ventricle has normal function. The left ventricle has no  regional wall motion abnormalities. 3D ejection fraction reviewed and  evaluated as part of the  interpretation. Alternate measurement of EF is felt to be most reflective  of LV function. The left ventricular internal cavity size was normal in  size. There is no left ventricular hypertrophy. Left ventricular diastolic  parameters were normal.   Right Ventricle: The right  ventricular size is normal. Right vetricular  wall thickness was not assessed. Right ventricular systolic function is  normal.   Left Atrium: Left atrial size was normal in size.   Right Atrium: Right atrial size was normal in size.   Pericardium: Trivial pericardial effusion is present.   Mitral Valve: The mitral valve is normal in structure. Trivial mitral  valve regurgitation.   Tricuspid Valve: The tricuspid valve is normal in structure. Tricuspid  valve regurgitation is mild.   Aortic Valve: Holodiastolic backflow present in proximal descending aorta.  The aortic valve is tricuspid. Aortic valve regurgitation is severe.  Aortic regurgitation PHT measures 307 msec.   Pulmonic Valve: The pulmonic valve was grossly normal. Pulmonic valve  regurgitation is mild.   Aorta: The aortic root and ascending aorta are structurally normal, with  no evidence of dilitation.   Venous: The inferior vena cava is normal in size with greater than 50%  respiratory variability, suggesting right atrial pressure of 3 mmHg.  IAS/Shunts: No atrial level shunt detected by color flow Doppler.       Assessment & Plan   1.  Severe AI-continues to note fatigue and DOE.  3-4 systolic murmur heard along right sternal border.  Previously contacted by Dr. Paulita Boss and reviewed need for right and left heart cath along with TEE.  Euvolemic.  Weight stable. Proceed with cardiac catheterization and TEE. She may hold Eliquis  for 2 days prior to her procedure. She is high risk for TEE due to dysphagia.  She wishes to proceed.  Informed Consent   Shared Decision Making/Informed Consent The risks [stroke (1 in 1000), death (1 in 1000), kidney failure [usually temporary] (1 in 500), bleeding (1 in 200), allergic reaction [possibly serious] (1 in 200)], benefits (diagnostic support and management of coronary artery disease) and alternatives of a cardiac catheterization were discussed in detail with Ms.  Berggren and she is willing to proceed.      Atrial fibrillation-EKG today shows sinus rhythm 72 bpm.  Denies recent episodes of increased or accelerated/irregular heartbeat.  She is status post ablation.  Reports compliance with apixaban .  Denies bleeding issues. Avoid triggers caffeine , chocolate, EtOH, dehydration etc. Continue diltiazem , apixaban   Coronary artery disease-denies recent anginal type symptoms.  She is status post stenting to her circumflex and mid LAD 9/16.  Had subsequent catheterization 2/18 which showed patent stents. Continue rosuvastatin , metoprolol   Hyperlipidemia-LDL 60 on 12/10/2023. High-fiber diet Continue co-Q10, rosuvastatin   Chronic diastolic CHF-appears well compensated today. Daily weights Heart healthy low-sodium diet Continue Lasix , metoprolol , spironolactone   Disposition: Follow-up with Dr. Paulita Boss as scheduled.   Chet Cota. Jalynn Waddell NP-C     03/12/2024, 11:38 AM [facility] [address] Office [phone] Fax [phone]    I spent 15 minutes examining this patient, reviewing medications, and using patient centered shared decision making involving their cardiac care.   I spent  20 minutes reviewing past medical history,  medications, and prior cardiac tests.

## 2024-03-10 NOTE — H&P (View-Only) (Signed)
 Cardiology Clinic Note   Patient Name: [name] Date of Encounter: 03/12/2024  Primary Care Provider:  Elester Grim, MD Primary Cardiologist:  Jann Melody, MD  Patient Profile    [name] 68 year old female presents to the clinic today for evaluation of her persistent dysphagia and pre-SAVR evaluation.  Past Medical History    Past Medical History:  Diagnosis Date   Allergy    Anemia    Aortic insufficiency    a. Mod by echo 05/2015.   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 Abnormal stress test -> LHc 06/2015 s/p DES to mCX and mLAD, residual D2 disease treated medially.   Diverticulitis    Erythema nodosum    Heart murmur    History of kidney stones    Hypertension    Insomnia    lung ca dx'd 10/1999   chemo/xrt comp 05/29/2000   Myalgia    Obesity    Pericardial effusion    a. 05/2015 Echo: EF 55-60%, no rwma, Gr 1 DD, no effusion - but an effusion was seen on CT which was felt to be increased in size compared to 12/2014 - pericardium also thickened.    Pericarditis    a. Dx 05/2015.   Pneumothorax, spontaneous, tension 01/20/2012   October, 2010   PONV (postoperative nausea and vomiting)    Pulmonary fibrosis (HCC) 01/20/2012   Due to previous surgery and chest radiation for lung cancer   Type II diabetes mellitus [facility])    Past Surgical History:  Procedure Laterality Date   ABDOMINAL HYSTERECTOMY  10/21/2006   APPENDECTOMY  1969?   ATRIAL FIBRILLATION ABLATION N/A 02/27/2022   Procedure: ATRIAL FIBRILLATION ABLATION;  Surgeon: Lei Pump, MD;  Location: [facility] INVASIVE CV LAB;  Service: Cardiovascular;  Laterality: N/A;   BRONCHIAL WASHINGS  08/03/2021   Procedure: BRONCHIAL WASHINGS;  Surgeon: Lind Repine, MD;  Location: [facility] ENDOSCOPY;  Service: Cardiopulmonary;;   CARDIAC CATHETERIZATION N/A 07/11/2015   Procedure: Left Heart Cath and Coronary Angiography;  Surgeon: Lucendia Rusk, MD;  Location: [facility] INVASIVE CV LAB;  Service: Cardiovascular;  Laterality: N/A;   CARDIAC CATHETERIZATION  07/11/2015   Procedure: Coronary Stent Intervention;  Surgeon: Lucendia Rusk, MD;  Location: [facility] INVASIVE CV LAB;  Service: Cardiovascular;;   CARPAL TUNNEL RELEASE Bilateral 1998-2005?   right-left   CARPAL TUNNEL RELEASE Left 2009?   "for the 2nd time"   CHOLECYSTECTOMY N/A 10/01/2022   Procedure: LAPAROSCOPIC CHOLECYSTECTOMY;  Surgeon: Adalberto Acton, MD;  Location: [facility] ORS;  Service: General;  Laterality: N/A;   COLONOSCOPY     COLONOSCOPY WITH PROPOFOL  N/A 05/28/2018   Procedure: COLONOSCOPY WITH PROPOFOL ;  Surgeon: Janel Medford, MD;  Location: [facility] ENDOSCOPY;  Service: Endoscopy;  Laterality: N/A;   CORONARY ANGIOPLASTY     CYSTOSCOPY W/ STONE MANIPULATION  10/22/2011   ESOPHAGOGASTRODUODENOSCOPY (EGD) WITH ESOPHAGEAL DILATION  10/21/2010   FOOT SURGERY Bilateral 06/22/1979   Callous removed    INNER EAR SURGERY Right ~ 2009   KNEE ARTHROPLASTY Right 10/21/1989   KNEE ARTHROSCOPY  ~ 2000   LATERAL RELEASE   LEFT HEART CATH AND CORONARY ANGIOGRAPHY N/A 12/18/2016   Procedure: Left Heart Cath and Coronary  Angiography;  Surgeon: Odie Benne, MD;  Location: [facility] INVASIVE CV LAB;  Service: Cardiovascular;  Laterality: N/A;   LEFT HEART CATH AND CORONARY ANGIOGRAPHY N/A 02/01/2021   Procedure: LEFT HEART CATH AND CORONARY ANGIOGRAPHY;  Surgeon: Arleen Lacer, MD;  Location: [facility] INVASIVE CV LAB;  Service: Cardiovascular;  Laterality: N/A;   LUNG CANCER SURGERY Left 10/22/1999   "small cell"   LUNG SURGERY Right 10/21/2005   "reinflated it"   PULMONARY EMBOLISM SURGERY  10/21/2005   LUNG COLLAPSE   RIGHT AND LEFT HEART CATH N/A 06/25/2017   Procedure: RIGHT AND LEFT HEART CATH;   Surgeon: Arnoldo Lapping, MD;  Location: [facility] INVASIVE CV LAB;  Service: Cardiovascular;  Laterality: N/A;   SHOULDER ADHESION RELEASE Right ~ 2004   SHOULDER ARTHROSCOPY W/ ROTATOR CUFF REPAIR Right ~ 2003   TOTAL KNEE ARTHROPLASTY Left 04/14/2023   Procedure: TOTAL KNEE ARTHROPLASTY;  Surgeon: Neil Balls, MD;  Location: [facility] ORS;  Service: Orthopedics;  Laterality: Left;   UPPER GASTROINTESTINAL ENDOSCOPY  06/05/2021   VIDEO BRONCHOSCOPY N/A 08/03/2021   Procedure: VIDEO BRONCHOSCOPY WITHOUT FLUORO;  Surgeon: Lind Repine, MD;  Location: [facility] ENDOSCOPY;  Service: Cardiopulmonary;  Laterality: N/A;    Allergies  Allergies  Allergen Reactions   Hydrocodone  Hives and Itching   Morphine  And Codeine  Other (See Comments)    GI PROBLEMS   Canagliflozin Diarrhea    Other Reaction(s): diarrhea   Metformin Hcl Diarrhea   Rosuvastatin  Other (See Comments)    Pt reports causes lower extremity muscle aches/cramping  Other Reaction(s): muscle cramps   Lisinopril  Cough    Other Reaction(s): cough   Nsaids Other (See Comments)    GI PROBLEMS  Other Reaction(s): GI problems    History of Present Illness    [name] has a PMH of coronary artery disease (PCI with DES to mid circumflex and mid LAD 9/16.  She underwent subsequent cardiac catheterization 2/18 and was noted to have patent stents), chronic pericardial effusion, chronic pericarditis, pericardial rub, (intermittently treated with steroids and colchicine ), chronic CHF, diabetes, HTN, COPD, pulmonary fibrosis, atrial fibrillation, and mild AI.  Echo cardiogram 02/05/2023 showed an LVEF of 55-60%, intermediate diastolic parameters, normal mitral valve, moderate aortic valve regurgitation with no aortic stenosis and a peak gradient of 22 mmHg.  She is post ablation and saw Dr. Lawana Pray 8/23.  She denied.  Current episodes of atrial fibrillation.  At that time she wanted to discontinue Tikosyn  and it was stopped.  She followed up with  Mertha Abrahams PA-C on 12/11/2023.  She was doing well at that time.  She denied chest pain shortness of breath.  She had no new symptoms that were not related to atrial fibrillation.  She did note lightheadedness with standing or after rising from a bent position.  She was encouraged to increase her p.o. hydration.  Of note she is not a candidate for amiodarone due to known pulmonary fibrosis.  She was contacted by Dr. Paulita Boss on 01/28/2024.  She reported rare fatigue and persistent dysphagia.  TEE and LHC/RHC for surgical evaluation were discussed.  She presents to the clinic today for evaluation and states she notices increased fatigue with daily activities such as making her bed and taking shower.  She has not been able to perform much physical activity because of this.  She also notes intermittent episodes of dizziness that last for several minutes and dissipate without intervention.  We reviewed her recent echocardiogram and her aortic stenosis.  She  presents with her sister.  They expressed understanding.  Her blood pressure today is 110/50.  Her EKG shows sinus rhythm 72 bpm.  I will order right and left heart cath, TEE, CBC, BMP and plan follow-up as scheduled after testing.  Today she denies  lower extremity edema, fatigue, palpitations, melena, hematuria, hemoptysis, diaphoresis,  presyncope, syncope, orthopnea, and PND.    Home Medications    Prior to Admission medications   Medication Sig Start Date End Date Taking? Authorizing Provider  ACCU-CHEK GUIDE test strip  05/23/22   [provider]  Accu-Chek Softclix Lancets lancets USE LANCETS 3 TIMES A DAY AS DIRECTED 30 DAYS 11/12/19   [provider]  acetaminophen  (TYLENOL ) 650 MG CR tablet Take 1,300 mg by mouth every 8 (eight) hours as needed for pain (headache).    [provider]  B-D UF III MINI PEN NEEDLES 31G X 5 MM MISC 2 (two) times daily. as directed 10/31/18   [provider]  Blood Glucose  Monitoring Suppl (ACCU-CHEK GUIDE) w/Device KIT See admin instructions. 11/12/19   [provider]  co-enzyme Q-10 50 MG capsule TAKE 1 CAPSULE BY MOUTH EVERY DAY 11/30/19   Liza Riggers, MD  diltiazem  (CARDIZEM  CD) 360 MG 24 hr capsule Take 1 capsule (360 mg total) by mouth daily. 09/23/23   Camnitz, Babetta Lesch, MD  ELIQUIS  5 MG TABS tablet TAKE 1 TABLET BY MOUTH TWICE A DAY 12/08/23   Camnitz, Babetta Lesch, MD  furosemide  (LASIX ) 40 MG tablet Take 1 tablet (40 mg total) by mouth daily as needed for fluid or edema. 03/15/21   Arty Binning, MD  levothyroxine (SYNTHROID, LEVOTHROID) 50 MCG tablet Take 50 mcg by mouth daily before breakfast.  05/14/17   [provider]  metoprolol  succinate (TOPROL -XL) 50 MG 24 hr tablet TAKE 1.5 TABLETS (75 MG TOTAL) BY MOUTH AT BEDTIME. TAKE WITH OR IMMEDIATELY FOLLOWING A MEAL. 02/27/24   Camnitz, Babetta Lesch, MD  [facility] 5 MG/0.5ML Pen Inject 5 mg into the skin once a week. 09/29/23   [provider]  nitroGLYCERIN  (NITROSTAT ) 0.4 MG SL tablet Place 0.4 mg under the tongue every 5 (five) minutes as needed for chest pain.    [provider]  NOVOLOG  MIX 70/30 FLEXPEN (70-30) 100 UNIT/ML FlexPen Inject 30 Units into the skin daily with breakfast. Also, 35 units in the evening 04/22/22   [provider]  rosuvastatin  (CRESTOR ) 20 MG tablet Take 20 mg by mouth daily. 03/06/23   [provider]  spironolactone  (ALDACTONE ) 25 MG tablet TAKE 1 TABLET (25 MG TOTAL) BY MOUTH DAILY. 07/23/22   Tylene Galla, PA-C    Family History    Family History  Problem Relation Age of Onset   Heart disease Father    Diabetes Father    Pancreatic cancer Mother    Diabetes Mother    Hypertension Sister        x 3   Diabetes Sister        x 4   Diabetes Maternal Grandmother    Diabetes Paternal Grandmother    Diabetes Sister    Diabetes Sister    Diabetes Sister    Kidney disease Sister    Colon cancer Neg Hx     Colon polyps Neg Hx    Rectal cancer Neg Hx    Stomach cancer Neg Hx    Esophageal cancer Neg Hx    Breast cancer Neg Hx    She indicated that  her mother is deceased. She indicated that her father is deceased. She indicated that five of her six sisters are alive. She indicated that her brother is alive. She indicated that her maternal grandmother is deceased. She indicated that her maternal grandfather is deceased. She indicated that her paternal grandmother is deceased. She indicated that her paternal grandfather is deceased. She indicated that the status of her neg hx is unknown.  Social History    Social History   Socioeconomic History   Marital status: Single    Spouse name: Not on file   Number of children: 0   Years of education: Not on file   Highest education level: Not on file  Occupational History   Occupation: RETIRED  Tobacco Use   Smoking status: Former    Current packs/day: 0.00    Average packs/day: 0.3 packs/day for 5.0 years (1.3 ttl pk-yrs)    Types: Cigarettes    Start date: 07/21/1994    Quit date: 07/22/1999    Years since quitting: 24.6   Smokeless tobacco: Never   Tobacco comments:    Former smoker 11/28/22  Vaping Use   Vaping status: Never Used  Substance and Sexual Activity   Alcohol use: No    Alcohol/week: 0.0 standard drinks of alcohol   Drug use: No   Sexual activity: Yes    Birth control/protection: Other-see comments    Comment: hysterectomy  Other Topics Concern   Not on file  Social History Narrative   Not on file   Social Drivers of Health   Financial Resource Strain: Low Risk  (07/12/2021)   Overall Financial Resource Strain (CARDIA)    Difficulty of Paying Living Expenses: Not hard at all  Food Insecurity: No Food Insecurity (04/14/2023)   Hunger Vital Sign    Worried About Running Out of Food in the Last Year: Never true    Ran Out of Food in the Last Year: Never true  Transportation Needs: No Transportation Needs (04/14/2023)    PRAPARE - Administrator, Civil Service (Medical): No    Lack of Transportation (Non-Medical): No  Physical Activity: Inactive (07/12/2021)   Exercise Vital Sign    Days of Exercise per Week: 0 days    Minutes of Exercise per Session: 0 min  Stress: No Stress Concern Present (07/12/2021)   [employer] of Occupational Health - Occupational Stress Questionnaire    Feeling of Stress : Not at all  Social Connections: Not on file  Intimate Partner Violence: Not At Risk (04/14/2023)   Humiliation, Afraid, Rape, and Kick questionnaire    Fear of Current or Ex-Partner: No    Emotionally Abused: No    Physically Abused: No    Sexually Abused: No     Review of Systems    General:  No chills, fever, night sweats or weight changes.  Cardiovascular:  No chest pain, dyspnea on exertion, edema, orthopnea, palpitations, paroxysmal nocturnal dyspnea. Dermatological: No rash, lesions/masses Respiratory: No cough, dyspnea Urologic: No hematuria, dysuria Abdominal:   No nausea, vomiting, diarrhea, bright red blood per rectum, melena, or hematemesis Neurologic:  No visual changes, wkns, changes in mental status. All other systems reviewed and are otherwise negative except as noted above.  Physical Exam    VS:  BP (!) 110/50   Pulse 72   Ht 5' 7.5" (1.715 m)   Wt 218 lb 6.4 oz (99.1 kg)   SpO2 98%   BMI 33.70 kg/m  , BMI Body mass index is  33.7 kg/m. GEN: Well nourished, well developed, in no acute distress. HEENT: normal. Neck: Supple, no JVD, carotid bruits, or masses. Cardiac: RRR, 3-4 out of 6 systolic murmur heard along right sternal border, rubs, or gallops. No clubbing, cyanosis, edema.  Radials/DP/PT 2+ and equal bilaterally.  Respiratory:  Respirations regular and unlabored, clear to auscultation bilaterally. GI: Soft, nontender, nondistended, BS + x 4. MS: no deformity or atrophy. Skin: warm and dry, no rash. Neuro:  Strength and sensation are intact. Psych: Normal  affect.  Accessory Clinical Findings    Recent Labs: 06/21/2023: ALT 9; BUN 17; Creatinine, Ser 0.90; Hemoglobin 11.2; Platelets 290; Potassium 4.6; Sodium 141   Recent Lipid Panel    Component Value Date/Time   CHOL 127 11/27/2021 0408   CHOL 130 04/25/2021 1614   TRIG 72 11/27/2021 0408   HDL 44 11/27/2021 0408   HDL 44 04/25/2021 1614   CHOLHDL 2.9 11/27/2021 0408   VLDL 14 11/27/2021 0408   LDLCALC 69 11/27/2021 0408   LDLCALC 67 04/25/2021 1614         ECG personally reviewed by me today- EKG Interpretation Date/Time:  Friday Mar 12 2024 11:19:07 EDT Ventricular Rate:  72 PR Interval:  124 QRS Duration:  96 QT Interval:  418 QTC Calculation: 457 R Axis:   74  Text Interpretation: Normal sinus rhythm Normal ECG When compared with ECG of 29-May-2023 14:08, No significant change was found Confirmed by Lawana Pray [phone]) on 03/12/2024 11:24:35 AM    Echocardiogram 01/26/2024  IMPRESSIONS     1. The aortic valve is tricuspid. Aortic valve regurgitation is severe.  Holodiastolic backflow seen in proximal descending aorta   2. Left ventricular ejection fraction, by estimation, is 65 to 70%. The  left ventricle has normal function. The left ventricle has no regional  wall motion abnormalities. Left ventricular diastolic parameters were  normal.   3. Right ventricular systolic function is normal. The right ventricular  size is normal.   FINDINGS   Left Ventricle: Left ventricular ejection fraction, by estimation, is 65  to 70%. The left ventricle has normal function. The left ventricle has no  regional wall motion abnormalities. 3D ejection fraction reviewed and  evaluated as part of the  interpretation. Alternate measurement of EF is felt to be most reflective  of LV function. The left ventricular internal cavity size was normal in  size. There is no left ventricular hypertrophy. Left ventricular diastolic  parameters were normal.   Right Ventricle: The right  ventricular size is normal. Right vetricular  wall thickness was not assessed. Right ventricular systolic function is  normal.   Left Atrium: Left atrial size was normal in size.   Right Atrium: Right atrial size was normal in size.   Pericardium: Trivial pericardial effusion is present.   Mitral Valve: The mitral valve is normal in structure. Trivial mitral  valve regurgitation.   Tricuspid Valve: The tricuspid valve is normal in structure. Tricuspid  valve regurgitation is mild.   Aortic Valve: Holodiastolic backflow present in proximal descending aorta.  The aortic valve is tricuspid. Aortic valve regurgitation is severe.  Aortic regurgitation PHT measures 307 msec.   Pulmonic Valve: The pulmonic valve was grossly normal. Pulmonic valve  regurgitation is mild.   Aorta: The aortic root and ascending aorta are structurally normal, with  no evidence of dilitation.   Venous: The inferior vena cava is normal in size with greater than 50%  respiratory variability, suggesting right atrial pressure of 3 mmHg.  IAS/Shunts: No atrial level shunt detected by color flow Doppler.       Assessment & Plan   1.  Severe AI-continues to note fatigue and DOE.  3-4 systolic murmur heard along right sternal border.  Previously contacted by Dr. Paulita Boss and reviewed need for right and left heart cath along with TEE.  Euvolemic.  Weight stable. Proceed with cardiac catheterization and TEE. She may hold Eliquis  for 2 days prior to her procedure. She is high risk for TEE due to dysphagia.  She wishes to proceed.  Informed Consent   Shared Decision Making/Informed Consent The risks [stroke (1 in 1000), death (1 in 1000), kidney failure [usually temporary] (1 in 500), bleeding (1 in 200), allergic reaction [possibly serious] (1 in 200)], benefits (diagnostic support and management of coronary artery disease) and alternatives of a cardiac catheterization were discussed in detail with Ms.  Berggren and she is willing to proceed.      Atrial fibrillation-EKG today shows sinus rhythm 72 bpm.  Denies recent episodes of increased or accelerated/irregular heartbeat.  She is status post ablation.  Reports compliance with apixaban .  Denies bleeding issues. Avoid triggers caffeine , chocolate, EtOH, dehydration etc. Continue diltiazem , apixaban   Coronary artery disease-denies recent anginal type symptoms.  She is status post stenting to her circumflex and mid LAD 9/16.  Had subsequent catheterization 2/18 which showed patent stents. Continue rosuvastatin , metoprolol   Hyperlipidemia-LDL 60 on 12/10/2023. High-fiber diet Continue co-Q10, rosuvastatin   Chronic diastolic CHF-appears well compensated today. Daily weights Heart healthy low-sodium diet Continue Lasix , metoprolol , spironolactone   Disposition: Follow-up with Dr. Paulita Boss as scheduled.   Chet Cota. Jalynn Waddell NP-C     03/12/2024, 11:38 AM [facility] [address] Office [phone] Fax [phone]    I spent 15 minutes examining this patient, reviewing medications, and using patient centered shared decision making involving their cardiac care.   I spent  20 minutes reviewing past medical history,  medications, and prior cardiac tests.

## 2024-03-12 ENCOUNTER — Ambulatory Visit: Attending: General Practice | Admitting: General Practice

## 2024-03-12 ENCOUNTER — Encounter: Payer: Self-pay | Admitting: *Deleted

## 2024-03-12 ENCOUNTER — Encounter: Payer: Self-pay | Admitting: General Practice

## 2024-03-12 VITALS — BP 110/50 | HR 72 | Ht 67.5 in | Wt 218.4 lb

## 2024-03-12 DIAGNOSIS — E782 Mixed hyperlipidemia: Secondary | ICD-10-CM

## 2024-03-12 DIAGNOSIS — I5032 Chronic diastolic (congestive) heart failure: Secondary | ICD-10-CM

## 2024-03-12 DIAGNOSIS — I480 Paroxysmal atrial fibrillation: Secondary | ICD-10-CM | POA: Diagnosis not present

## 2024-03-12 DIAGNOSIS — I2510 Atherosclerotic heart disease of native coronary artery without angina pectoris: Secondary | ICD-10-CM | POA: Diagnosis not present

## 2024-03-12 DIAGNOSIS — I351 Nonrheumatic aortic (valve) insufficiency: Secondary | ICD-10-CM

## 2024-03-12 NOTE — Patient Instructions (Addendum)
 Medication Instructions:  No medication changes were made during today's visit.  *If you need a refill on your cardiac medications before your next appointment, please call your pharmacy*  Lab Work: No labs were ordered during today's visit.  If you have labs (blood work) drawn today and your tests are completely normal, you will receive your results only by: MyChart Message (if you have MyChart) OR A paper copy in the mail If you have any lab test that is abnormal or we need to change your treatment, we will call you to review the results.  Testing/Procedures: Your physician has requested that you have a TEE. During a TEE, sound waves are used to create images of your heart. It provides your doctor with information about the size and shape of your heart and how well your heart's chambers and valves are working. In this test, a transducer is attached to the end of a flexible tube that's guided down your throat and into your esophagus (the tube leading from you mouth to your stomach) to get a more detailed image of your heart. You are not awake for the procedure. Please see the instruction sheet given to you today. For further information please visit [email or web address]  Follow-Up: At [facility], you and your health needs are our priority.  As part of our continuing mission to provide you with exceptional heart care, our providers are all part of one team.  This team includes your primary Cardiologist (physician) and Advanced Practice Providers or APPs (Physician Assistants and Nurse Practitioners) who all work together to provide you with the care you need, when you need it.  Your next appointment:  follow up with Dr Paulita Boss after the procedure    Provider:   Jann Melody, MD    We recommend signing up for the patient portal called "MyChart".  Sign up information is provided on this After Visit Summary.  MyChart is used to connect with patients for Virtual Visits  (Telemedicine).  Patients are able to view lab/test results, encounter notes, upcoming appointments, etc.  Non-urgent messages can be sent to your provider as well.   To learn more about what you can do with MyChart, go to [email or web address].   Other Instructions Thank you for choosing [facility]!

## 2024-03-13 LAB — BASIC METABOLIC PANEL WITH GFR
BUN/Creatinine Ratio: 23 (ref 12–28)
BUN: 18 mg/dL (ref 8–27)
CO2: 26 mmol/L (ref 20–29)
Calcium: 9.5 mg/dL (ref 8.7–10.3)
Chloride: 105 mmol/L (ref 96–106)
Creatinine, Ser: 0.8 mg/dL (ref 0.57–1.00)
Glucose: 132 mg/dL — ABNORMAL HIGH (ref 70–99)
Potassium: 4.8 mmol/L (ref 3.5–5.2)
Sodium: 144 mmol/L (ref 134–144)
eGFR: 82 mL/min/{1.73_m2} (ref >59)

## 2024-03-13 LAB — CBC
Hematocrit: 37.2 % (ref 34.0–46.6)
Hemoglobin: 12.1 g/dL (ref 11.1–15.9)
MCH: 31.7 pg (ref 26.6–33.0)
MCHC: 32.5 g/dL (ref 31.5–35.7)
MCV: 97 fL (ref 79–97)
Platelets: 298 10*3/uL (ref 150–450)
RBC: 3.82 x10E6/uL (ref 3.77–5.28)
RDW: 11.9 % (ref 11.7–15.4)
WBC: 5.7 10*3/uL (ref 3.4–10.8)

## 2024-03-14 ENCOUNTER — Ambulatory Visit: Payer: Self-pay | Admitting: General Practice

## 2024-03-16 NOTE — Addendum Note (Signed)
 Addended by: Carie Charity on: 03/16/2024 09:32 AM   Modules accepted: Orders

## 2024-03-17 NOTE — Progress Notes (Signed)
 Called patient with pre-procedure instructions for tomorrow.   Patient informed of:   Time to arrive for procedure. 0930 Remain NPO past midnight.  Must have a ride home and a responsible adult to remain with them for 24 hours post procedure.  Confirmed blood thinner. Confirmed no breaks in taking blood thinner for 3+ weeks prior to procedure. Confirmed patient stopped all GLP-1s and GLP-2s for at least one week before procedure. Mounjaro last dose 5/17

## 2024-03-18 ENCOUNTER — Ambulatory Visit (HOSPITAL_COMMUNITY): Admitting: Anesthesiology

## 2024-03-18 ENCOUNTER — Encounter (HOSPITAL_COMMUNITY)
Admission: RE | Disposition: A | Discharge status: Home / Self Care | Payer: Self-pay | Source: Home / Self Care | Attending: Cardiovascular Disease

## 2024-03-18 ENCOUNTER — Ambulatory Visit (HOSPITAL_COMMUNITY)
Admission: RE | Admit: 2024-03-18 | Discharge: 2024-03-18 | Disposition: A | Discharge status: Home / Self Care | Source: Home / Self Care | Attending: Cardiovascular Disease | Admitting: Cardiovascular Disease

## 2024-03-18 ENCOUNTER — Other Ambulatory Visit: Payer: Self-pay

## 2024-03-18 ENCOUNTER — Ambulatory Visit (HOSPITAL_COMMUNITY)

## 2024-03-18 DIAGNOSIS — I4819 Other persistent atrial fibrillation: Secondary | ICD-10-CM

## 2024-03-18 DIAGNOSIS — I351 Nonrheumatic aortic (valve) insufficiency: Secondary | ICD-10-CM

## 2024-03-18 SURGERY — TRANSESOPHAGEAL ECHOCARDIOGRAM (TEE) (CATHLAB)
Anesthesia: Monitor Anesthesia Care

## 2024-03-18 MED ORDER — SODIUM CHLORIDE 0.9 % IV SOLN: INTRAVENOUS | Status: DC

## 2024-03-18 NOTE — Anesthesia Preprocedure Evaluation (Signed)
 Anesthesia Evaluation  Patient identified by MRN, date of birth, ID band Patient awake    Reviewed: Allergy & Precautions, H&P , NPO status , Patient's Chart, lab work & pertinent test results  History of Anesthesia Complications (+) PONV and history of anesthetic complications  Airway Mallampati: II  TM Distance: >3 FB Neck ROM: Full    Dental no notable dental hx. (+) Upper Dentures, Dental Advisory Given   Pulmonary sleep apnea , former smoker   Pulmonary exam normal breath sounds clear to auscultation       Cardiovascular hypertension, Pt. on medications and Pt. on home beta blockers + CAD, + Past MI, + Peripheral Vascular Disease and +CHF  + dysrhythmias Atrial Fibrillation + Valvular Problems/Murmurs AI  Rhythm:Regular Rate:Normal     Neuro/Psych negative neurological ROS  negative psych ROS   GI/Hepatic negative GI ROS, Neg liver ROS,,,  Endo/Other  diabetes, Type 2Hypothyroidism    Renal/GU negative Renal ROS  negative genitourinary   Musculoskeletal  (+) Arthritis ,    Abdominal   Peds  Hematology  (+) Blood dyscrasia, anemia   Anesthesia Other Findings   Reproductive/Obstetrics negative OB ROS                             Anesthesia Physical Anesthesia Plan  ASA: 3  Anesthesia Plan: MAC   Post-op Pain Management: Minimal or no pain anticipated   Induction: Intravenous  PONV Risk Score and Plan: 3 and Propofol  infusion and Treatment may vary due to age or medical condition  Airway Management Planned: Natural Airway and Simple Face Mask  Additional Equipment:   Intra-op Plan:   Post-operative Plan:   Informed Consent: I have reviewed the patients History and Physical, chart, labs and discussed the procedure including the risks, benefits and alternatives for the proposed anesthesia with the patient or authorized representative who has indicated his/her understanding  and acceptance.     Dental advisory given  Plan Discussed with: CRNA  Anesthesia Plan Comments:        Anesthesia Quick Evaluation

## 2024-03-22 ENCOUNTER — Ambulatory Visit
Admission: RE | Admit: 2024-03-22 | Discharge: 2024-03-22 | Disposition: A | Discharge status: Home / Self Care | Source: Ambulatory Visit | Attending: Internal Medicine | Admitting: Internal Medicine

## 2024-03-22 ENCOUNTER — Telehealth: Payer: Self-pay | Admitting: Physician Assistant

## 2024-03-22 DIAGNOSIS — Z1231 Encounter for screening mammogram for malignant neoplasm of breast: Secondary | ICD-10-CM | POA: Diagnosis not present

## 2024-03-22 NOTE — Telephone Encounter (Signed)
 Inbound call from patient, would like to speak with a nurse, patient states she had a procedure scheduled but it was canceled when she got there due to a fault from our practice. Patient states she would like to get procedure rescheduled but needs to obtain clearance first. No procedure information past or present in appointment tab. Patient would like to discuss further with a nurse.

## 2024-03-23 ENCOUNTER — Telehealth: Payer: Self-pay

## 2024-03-23 NOTE — Telephone Encounter (Signed)
-----   Message from [name] sent at 03/23/2024 10:31 AM EDT ----- Regarding: FW: EGD for Mrs. Adler If we have to, schedule for ASAP EGD at hospital with cardiac permission but due to the conversation below and the barium swallow an EGD was not elected.  [name] ----- Message ----- From: [name] Melody, MD Sent: 02/11/2024   8:28 AM EDT To: [name] Gottron, PA-C; [name] Cozier, RN Subject: RE: EGD for Mrs. Aldape                         Thanks for reaching out.  I trust your judgement.  I just needed clearance to pursue TEE.  We will pursue this.   [name]- can you get this young lady on the schedule of one of mine APPs: Pre-procedural visit for LHC/RHC, TEE and Surgical Evaluation (SAVR).  Thanks again,  [name] Larger, MD FASE [facility] Cardiologist [facility]  [address] [place], [address] [place], [place] [address] [phone]  8:28 AM ----- Message ----- From: [name] Sent: 02/11/2024   8:18 AM EDT To: [name] Melody, MD Subject: EGD for Mrs. Bennie Brave Morning, I had the pleasure of seeing [name] yesterday in clinic.  I had obtained a barium swallow prior to the office visit that showed likely the cause of her dysphagia is external compression of the aorta.  She was unable to take the barium tablet however.  I do believe the majority of her symptoms are coming from this dysphagia aortic so I feel an endoscopy is not necessary however if you feel like it would be in the patient's best interest for us  to do an endoscopy prior to the TEE we will gladly set this up.  We do have openings at [facility] for an EGD with Dr. Venice Gillis on 6-12 and 6-26 if you would like to try to coordinate our efforts to do the endoscopy first with a TEE following.  If not we we will try to get her in the first available [place] opening for an EGD and let you know after that is completed. I forwarded you the note, Let me  know your thoughts on this case,  Thanks, [name] Cull, PA-C

## 2024-03-23 NOTE — Telephone Encounter (Signed)
 Mylinda Asa, please see note below. It looks like TEE was cancelled. I called the hospital to see why, it stated that EGD was needed first. I do not see where this was relayed to our staff. EGD in the [facility] or hospital?

## 2024-03-23 NOTE — Telephone Encounter (Signed)
 [name],  Our mutual patient [name] reached out to our office regarding her cancelled TEE. Please see notes between [facility] and Dr. Domingo Friend. Not sure why TEE was cancelled, but from GI standpoint it was not a necessity. Please see 6/2 telephone encounter in her chart. Thank you, Vertell Gory, RN

## 2024-03-23 NOTE — Telephone Encounter (Signed)
 I have created a new telephone encounter with staff message between you and Dr. Paulita Boss. The TEE was cancelled by Haze List, RN. The TE has been routed to her, will forward to Dr. Paulita Boss as well.

## 2024-03-23 NOTE — Telephone Encounter (Signed)
 Pt has been scheduled for TEE on 04/01/2024 with Dr Paulita Boss.  Spoke with pt and reviewed instructions and pt informed they have been placed on her MyChart as well. Pt verbalizes understanding and thanked Charity fundraiser for the call.

## 2024-03-31 NOTE — Progress Notes (Signed)
 Spoke to patient and instructed them to come at 09:00  and to be NPO after 0000. Medications reviewed.   Confirmed that patient will have a ride home and someone to stay with them for 24 hours after the procedure.

## 2024-04-01 ENCOUNTER — Other Ambulatory Visit: Payer: Medicare Other

## 2024-04-01 ENCOUNTER — Ambulatory Visit (HOSPITAL_COMMUNITY): Admitting: Certified Registered Nurse Anesthetist

## 2024-04-01 ENCOUNTER — Encounter (HOSPITAL_COMMUNITY): Payer: Self-pay | Admitting: Internal Medicine

## 2024-04-01 ENCOUNTER — Other Ambulatory Visit: Payer: Self-pay

## 2024-04-01 ENCOUNTER — Encounter (HOSPITAL_COMMUNITY)
Admission: RE | Disposition: A | Discharge status: Home / Self Care | Payer: Self-pay | Source: Home / Self Care | Attending: Internal Medicine

## 2024-04-01 ENCOUNTER — Ambulatory Visit (HOSPITAL_COMMUNITY)

## 2024-04-01 ENCOUNTER — Ambulatory Visit (HOSPITAL_COMMUNITY)
Admission: RE | Admit: 2024-04-01 | Discharge: 2024-04-01 | Disposition: A | Discharge status: Home / Self Care | Attending: Internal Medicine | Admitting: Internal Medicine

## 2024-04-01 DIAGNOSIS — Z7985 Long-term (current) use of injectable non-insulin antidiabetic drugs: Secondary | ICD-10-CM | POA: Insufficient documentation

## 2024-04-01 DIAGNOSIS — I3139 Other pericardial effusion (noninflammatory): Secondary | ICD-10-CM | POA: Diagnosis not present

## 2024-04-01 DIAGNOSIS — Z87891 Personal history of nicotine dependence: Secondary | ICD-10-CM

## 2024-04-01 DIAGNOSIS — G4730 Sleep apnea, unspecified: Secondary | ICD-10-CM | POA: Diagnosis not present

## 2024-04-01 DIAGNOSIS — Z955 Presence of coronary angioplasty implant and graft: Secondary | ICD-10-CM | POA: Diagnosis not present

## 2024-04-01 DIAGNOSIS — I080 Rheumatic disorders of both mitral and aortic valves: Secondary | ICD-10-CM | POA: Diagnosis not present

## 2024-04-01 DIAGNOSIS — I340 Nonrheumatic mitral (valve) insufficiency: Secondary | ICD-10-CM | POA: Diagnosis not present

## 2024-04-01 DIAGNOSIS — Z6832 Body mass index (BMI) 32.0-32.9, adult: Secondary | ICD-10-CM | POA: Diagnosis not present

## 2024-04-01 DIAGNOSIS — I4891 Unspecified atrial fibrillation: Secondary | ICD-10-CM | POA: Diagnosis not present

## 2024-04-01 DIAGNOSIS — Z85118 Personal history of other malignant neoplasm of bronchus and lung: Secondary | ICD-10-CM | POA: Diagnosis not present

## 2024-04-01 DIAGNOSIS — E1151 Type 2 diabetes mellitus with diabetic peripheral angiopathy without gangrene: Secondary | ICD-10-CM | POA: Insufficient documentation

## 2024-04-01 DIAGNOSIS — I252 Old myocardial infarction: Secondary | ICD-10-CM | POA: Insufficient documentation

## 2024-04-01 DIAGNOSIS — I351 Nonrheumatic aortic (valve) insufficiency: Secondary | ICD-10-CM | POA: Diagnosis not present

## 2024-04-01 DIAGNOSIS — E669 Obesity, unspecified: Secondary | ICD-10-CM | POA: Insufficient documentation

## 2024-04-01 DIAGNOSIS — E785 Hyperlipidemia, unspecified: Secondary | ICD-10-CM | POA: Insufficient documentation

## 2024-04-01 DIAGNOSIS — Z79899 Other long term (current) drug therapy: Secondary | ICD-10-CM | POA: Insufficient documentation

## 2024-04-01 DIAGNOSIS — I10 Essential (primary) hypertension: Secondary | ICD-10-CM | POA: Diagnosis not present

## 2024-04-01 DIAGNOSIS — Z7901 Long term (current) use of anticoagulants: Secondary | ICD-10-CM | POA: Insufficient documentation

## 2024-04-01 DIAGNOSIS — I2510 Atherosclerotic heart disease of native coronary artery without angina pectoris: Secondary | ICD-10-CM | POA: Diagnosis not present

## 2024-04-01 DIAGNOSIS — I5032 Chronic diastolic (congestive) heart failure: Secondary | ICD-10-CM | POA: Insufficient documentation

## 2024-04-01 DIAGNOSIS — J8410 Pulmonary fibrosis, unspecified: Secondary | ICD-10-CM | POA: Diagnosis not present

## 2024-04-01 DIAGNOSIS — E039 Hypothyroidism, unspecified: Secondary | ICD-10-CM | POA: Diagnosis not present

## 2024-04-01 DIAGNOSIS — I110 Hypertensive heart disease with heart failure: Secondary | ICD-10-CM | POA: Diagnosis not present

## 2024-04-01 DIAGNOSIS — I739 Peripheral vascular disease, unspecified: Secondary | ICD-10-CM | POA: Insufficient documentation

## 2024-04-01 HISTORY — PX: TRANSESOPHAGEAL ECHOCARDIOGRAM (CATH LAB): EP1270

## 2024-04-01 LAB — GLUCOSE, CAPILLARY: Glucose-Capillary: 131 mg/dL — ABNORMAL HIGH (ref 70–99)

## 2024-04-01 LAB — ECHO TEE

## 2024-04-01 SURGERY — TRANSESOPHAGEAL ECHOCARDIOGRAM (TEE) (CATHLAB)
Anesthesia: Monitor Anesthesia Care

## 2024-04-01 MED ORDER — SODIUM CHLORIDE 0.9 % IV SOLN: INTRAVENOUS | Status: DC | PRN

## 2024-04-01 MED ORDER — PHENYLEPHRINE HCL-NACL 20-0.9 MG/250ML-% IV SOLN
INTRAVENOUS | Status: DC | PRN
  Administered 2024-04-01: 50 ug/min via INTRAVENOUS

## 2024-04-01 MED ORDER — PROPOFOL 500 MG/50ML IV EMUL
INTRAVENOUS | Status: DC | PRN
  Administered 2024-04-01: 150 ug/kg/min via INTRAVENOUS

## 2024-04-01 MED ORDER — PROPOFOL 10 MG/ML IV BOLUS
INTRAVENOUS | Status: DC | PRN
  Administered 2024-04-01: 50 mg via INTRAVENOUS

## 2024-04-01 MED ORDER — LIDOCAINE 2% (20 MG/ML) 5 ML SYRINGE
INTRAMUSCULAR | Status: DC | PRN
  Administered 2024-04-01: 100 mg via INTRAVENOUS

## 2024-04-01 NOTE — Anesthesia Preprocedure Evaluation (Addendum)
 Anesthesia Evaluation  Patient identified by MRN, date of birth, ID band Patient awake    Reviewed: Allergy & Precautions, NPO status , Patient's Chart, lab work & pertinent test results, reviewed documented beta blocker date and time   History of Anesthesia Complications (+) PONV and history of anesthetic complications  Airway Mallampati: II  TM Distance: >3 FB Neck ROM: Full    Dental  (+) Dental Advisory Given, Missing   Pulmonary sleep apnea , former smoker  Lung cancer Pulmonary fibrosis    Pulmonary exam normal        Cardiovascular hypertension, Pt. on home beta blockers and Pt. on medications + CAD, + Past MI, + Cardiac Stents and + Peripheral Vascular Disease  + dysrhythmias Atrial Fibrillation + Valvular Problems/Murmurs AI  Rhythm:Irregular Rate:Normal + Diastolic murmurs  '25 TTE - Aortic valve regurgitation is severe. Holodiastolic backflow seen in proximal descending aorta. EF is 65 to 70%.     Neuro/Psych negative neurological ROS  negative psych ROS   GI/Hepatic negative GI ROS, Neg liver ROS,,,  Endo/Other  diabetes, Type 2, Insulin  DependentHypothyroidism   Obesity   Renal/GU negative Renal ROS     Musculoskeletal  (+) Arthritis ,    Abdominal   Peds  Hematology  On eliquis     Anesthesia Other Findings On GLP-1a   Reproductive/Obstetrics                             Anesthesia Physical Anesthesia Plan  ASA: 4  Anesthesia Plan: MAC   Post-op Pain Management: Minimal or no pain anticipated   Induction:   PONV Risk Score and Plan: 3 and Propofol  infusion and Treatment may vary due to age or medical condition  Airway Management Planned: Nasal Cannula and Natural Airway  Additional Equipment: None  Intra-op Plan:   Post-operative Plan:   Informed Consent: I have reviewed the patients History and Physical, chart, labs and discussed the procedure  including the risks, benefits and alternatives for the proposed anesthesia with the patient or authorized representative who has indicated his/her understanding and acceptance.       Plan Discussed with: CRNA and Anesthesiologist  Anesthesia Plan Comments:         Anesthesia Quick Evaluation

## 2024-04-01 NOTE — CV Procedure (Signed)
    TRANSESOPHAGEAL ECHOCARDIOGRAM   NAME:  [name]    MRN: [id number] DOB:  [birth year]    ADMIT DATE: 04/01/2024  INDICATIONS: Aortic regurgitation  PROCEDURE:   Informed consent was obtained prior to the procedure. The risks, benefits and alternatives for the procedure were discussed and the patient comprehended these risks.  Risks include, but are not limited to, cough, sore throat, vomiting, nausea, somnolence, esophageal and stomach trauma or perforation, bleeding, low blood pressure, aspiration, pneumonia, infection, trauma to the teeth and death.    Procedural time out performed. The oropharynx was anesthetized with topical 1% benzocaine .    Anesthesia was administered by Dr. Glenetta Lane and team.  The patient's heart rate, blood pressure, and oxygen saturation are monitored continuously during the procedure.   The transesophageal probe was inserted in the esophagus and stomach without difficulty and multiple views were obtained. Given patient's history an X-11 probe was used.  To resistance met.  Suboptimal gastric images reflect probe usage, planned for safety  COMPLICATIONS:    There were no immediate complications.  KEY FINDINGS:  Moderate aortic regurgitation with normal left ventricular size and function.   There is non-holodiastolic flow reversal in the ascending aorta; the peak velocity is less than 20 cm/2 at peak R wave. Full report to follow. Will plan for CMR in one year.  Gloriann Larger, MD [place]  [facility]  10:26 AM

## 2024-04-01 NOTE — Interval H&P Note (Signed)
 History and Physical Interval Note:  04/01/2024 9:37 AM  [name]  has presented today for surgery, with the diagnosis of AFIB.  The various methods of treatment have been discussed with the patient and family. After consideration of risks, benefits and other options for treatment, the patient has consented to  Procedure(s): TRANSESOPHAGEAL ECHOCARDIOGRAM (N/A) as a surgical intervention.  The patient's history has been reviewed, patient examined, no change in status, stable for surgery.  I have reviewed the patient's chart and labs.  Questions were answered to the patient's satisfaction.    We have discussed her care in the setting of her barium swallow, prior EGDs, prior malignancy history.  We have discussed care with Ms Cecil Code.  We have discussed the risks and benefits of a variety of plans and have planned for the following: If moderate AI- planned for CMR in one year If severe AI with AS- planned for structural eval If isolated severe AI, will plan for [facility] and Surgical evaluation. If unable to safely pass an X-11 probe, will plan for CMR and will send message to Ms Gina Lagos for potential further GI interventions.  Gloriann Larger, MD FASE [facility] Cardiologist [facility]  [address] [place], [place] [address] [phone]  9:39 AM

## 2024-04-01 NOTE — Transfer of Care (Signed)
 Immediate Anesthesia Transfer of Care Note  Patient: [name]  Procedure(s) Performed: TRANSESOPHAGEAL ECHOCARDIOGRAM  Patient Location: Cath Lab  Anesthesia Type:MAC  Level of Consciousness: awake, alert , and oriented  Airway & Oxygen Therapy: Patient Spontanous Breathing  Post-op Assessment: Report given to RN, Post -op Vital signs reviewed and stable, Patient moving all extremities X 4, and Patient able to stick tongue midline  Post vital signs: Reviewed and stable  Last Vitals:  Vitals Value Taken Time  BP 123/78 04/01/24 10:12  Temp 36.4 C 04/01/24 10:12  Pulse 82 04/01/24 10:14  Resp 15 04/01/24 10:14  SpO2 97 % 04/01/24 10:14  Vitals shown include unfiled device data.  Last Pain:  Vitals:   04/01/24 1012  TempSrc: Tympanic  PainSc: Asleep         Complications: No notable events documented.

## 2024-04-01 NOTE — Anesthesia Postprocedure Evaluation (Signed)
 Anesthesia Post Note  Patient: DAIL MEECE  Procedure(s) Performed: TRANSESOPHAGEAL ECHOCARDIOGRAM     Patient location during evaluation: PACU Anesthesia Type: MAC Level of consciousness: awake and alert Pain management: pain level controlled Vital Signs Assessment: post-procedure vital signs reviewed and stable Respiratory status: spontaneous breathing, nonlabored ventilation and respiratory function stable Cardiovascular status: stable and blood pressure returned to baseline Anesthetic complications: no  No notable events documented.  Last Vitals:  Vitals:   04/01/24 1022 04/01/24 1032  BP: 137/83 114/82  Pulse: 74 66  Resp: 15 19  Temp:    SpO2: 97% 96%    Last Pain:  Vitals:   04/01/24 1032  TempSrc:   PainSc: 0-No pain                 Juventino Oppenheim

## 2024-04-01 NOTE — Discharge Instructions (Signed)

## 2024-04-08 ENCOUNTER — Encounter (HOSPITAL_COMMUNITY): Payer: Self-pay

## 2024-04-08 ENCOUNTER — Emergency Department (HOSPITAL_COMMUNITY)
Admission: EM | Admit: 2024-04-08 | Discharge: 2024-04-09 | Disposition: A | Discharge status: Home / Self Care | Attending: Emergency Medicine | Admitting: Emergency Medicine

## 2024-04-08 ENCOUNTER — Emergency Department (HOSPITAL_COMMUNITY)

## 2024-04-08 DIAGNOSIS — R1084 Generalized abdominal pain: Secondary | ICD-10-CM | POA: Diagnosis not present

## 2024-04-08 DIAGNOSIS — Z79899 Other long term (current) drug therapy: Secondary | ICD-10-CM | POA: Diagnosis not present

## 2024-04-08 DIAGNOSIS — R110 Nausea: Secondary | ICD-10-CM | POA: Insufficient documentation

## 2024-04-08 DIAGNOSIS — E119 Type 2 diabetes mellitus without complications: Secondary | ICD-10-CM | POA: Insufficient documentation

## 2024-04-08 DIAGNOSIS — D649 Anemia, unspecified: Secondary | ICD-10-CM | POA: Diagnosis not present

## 2024-04-08 DIAGNOSIS — R10817 Generalized abdominal tenderness: Secondary | ICD-10-CM | POA: Diagnosis not present

## 2024-04-08 DIAGNOSIS — R112 Nausea with vomiting, unspecified: Secondary | ICD-10-CM | POA: Diagnosis not present

## 2024-04-08 DIAGNOSIS — I110 Hypertensive heart disease with heart failure: Secondary | ICD-10-CM | POA: Diagnosis not present

## 2024-04-08 DIAGNOSIS — N281 Cyst of kidney, acquired: Secondary | ICD-10-CM | POA: Diagnosis not present

## 2024-04-08 DIAGNOSIS — Z7901 Long term (current) use of anticoagulants: Secondary | ICD-10-CM | POA: Diagnosis not present

## 2024-04-08 DIAGNOSIS — I509 Heart failure, unspecified: Secondary | ICD-10-CM | POA: Diagnosis not present

## 2024-04-08 DIAGNOSIS — R1032 Left lower quadrant pain: Secondary | ICD-10-CM

## 2024-04-08 DIAGNOSIS — K5730 Diverticulosis of large intestine without perforation or abscess without bleeding: Secondary | ICD-10-CM | POA: Diagnosis not present

## 2024-04-08 LAB — COMPREHENSIVE METABOLIC PANEL WITH GFR
ALT: 11 U/L (ref 0–44)
AST: 17 U/L (ref 15–41)
Albumin: 4 g/dL (ref 3.5–5.0)
Alkaline Phosphatase: 105 U/L (ref 38–126)
Anion gap: 9 (ref 5–15)
BUN: 23 mg/dL (ref 8–23)
CO2: 27 mmol/L (ref 22–32)
Calcium: 9.3 mg/dL (ref 8.9–10.3)
Chloride: 103 mmol/L (ref 98–111)
Creatinine, Ser: 1.05 mg/dL — ABNORMAL HIGH (ref 0.44–1.00)
GFR, Estimated: 59 mL/min — ABNORMAL LOW (ref >60)
Glucose, Bld: 116 mg/dL — ABNORMAL HIGH (ref 70–99)
Potassium: 4.1 mmol/L (ref 3.5–5.1)
Sodium: 139 mmol/L (ref 135–145)
Total Bilirubin: 0.6 mg/dL (ref 0.0–1.2)
Total Protein: 7.6 g/dL (ref 6.5–8.1)

## 2024-04-08 LAB — URINALYSIS, ROUTINE W REFLEX MICROSCOPIC
Bilirubin Urine: NEGATIVE
Glucose, UA: NEGATIVE mg/dL
Hgb urine dipstick: NEGATIVE
Ketones, ur: NEGATIVE mg/dL
Leukocytes,Ua: NEGATIVE
Nitrite: NEGATIVE
Protein, ur: NEGATIVE mg/dL
Specific Gravity, Urine: 1.027 (ref 1.005–1.030)
pH: 5 (ref 5.0–8.0)

## 2024-04-08 LAB — CBC
HCT: 37.6 % (ref 36.0–46.0)
Hemoglobin: 11.9 g/dL — ABNORMAL LOW (ref 12.0–15.0)
MCH: 30.8 pg (ref 26.0–34.0)
MCHC: 31.6 g/dL (ref 30.0–36.0)
MCV: 97.4 fL (ref 80.0–100.0)
Platelets: 259 10*3/uL (ref 150–400)
RBC: 3.86 MIL/uL — ABNORMAL LOW (ref 3.87–5.11)
RDW: 11.7 % (ref 11.5–15.5)
WBC: 8.5 10*3/uL (ref 4.0–10.5)
nRBC: 0 % (ref 0.0–0.2)

## 2024-04-08 LAB — LIPASE, BLOOD: Lipase: 33 U/L (ref 11–51)

## 2024-04-08 MED ORDER — IOHEXOL 300 MG/ML  SOLN
100.0000 mL | Freq: Once | INTRAMUSCULAR | Status: AC | PRN
  Administered 2024-04-08: 100 mL via INTRAVENOUS

## 2024-04-08 MED ORDER — FENTANYL CITRATE PF 50 MCG/ML IJ SOSY
75.0000 ug | PREFILLED_SYRINGE | Freq: Once | INTRAMUSCULAR | Status: AC
  Administered 2024-04-08: 75 ug via INTRAVENOUS
  Filled 2024-04-08: qty 2

## 2024-04-08 MED ORDER — TIZANIDINE HCL 4 MG PO TABS: 4.0000 mg | ORAL_TABLET | Freq: Four times a day (QID) | ORAL | 0 refills | Status: DC | PRN

## 2024-04-08 MED ORDER — FENTANYL CITRATE PF 50 MCG/ML IJ SOSY
25.0000 ug | PREFILLED_SYRINGE | Freq: Once | INTRAMUSCULAR | Status: AC
  Administered 2024-04-08: 25 ug via INTRAVENOUS
  Filled 2024-04-08: qty 1

## 2024-04-08 MED ORDER — ONDANSETRON HCL 4 MG/2ML IJ SOLN
4.0000 mg | Freq: Once | INTRAMUSCULAR | Status: AC
  Administered 2024-04-08: 4 mg via INTRAVENOUS
  Filled 2024-04-08: qty 2

## 2024-04-08 MED ORDER — DICYCLOMINE HCL 10 MG PO CAPS
10.0000 mg | ORAL_CAPSULE | Freq: Once | ORAL | Status: AC
  Administered 2024-04-08: 10 mg via ORAL
  Filled 2024-04-08: qty 1

## 2024-04-08 MED ORDER — DICYCLOMINE HCL 20 MG PO TABS: 20.0000 mg | ORAL_TABLET | Freq: Two times a day (BID) | ORAL | 0 refills | Status: DC

## 2024-04-08 NOTE — ED Provider Notes (Signed)
 [place] EMERGENCY DEPARTMENT AT [facility] Provider Note   CSN: [id number] Arrival date & time: 04/08/24  4098     Patient presents with: Abdominal Pain   [name] is a 68 y.o. female.  Patient with past history significant for type 2 diabetes, anemia, obesity, hypertension, CHF, diverticulitis presents to the emergency department with concerns of abdominal pain.  She reports diffuse abdominal pain that has been present for the last day without significant improvement.  No reported vomiting but does endorse some nausea.  Regarding bowel movements, she reports last bowel movement was earlier today but she describes it as a loose stool.   Abdominal Pain      Prior to Admission medications   Medication Sig Start Date End Date Taking? Authorizing Provider  ACCU-CHEK GUIDE test strip  05/23/22   [provider]  Accu-Chek Softclix Lancets lancets USE LANCETS 3 TIMES A DAY AS DIRECTED 30 DAYS 11/12/19   [provider]  acetaminophen  (TYLENOL ) 650 MG CR tablet Take 1,300 mg by mouth every 8 (eight) hours as needed for pain (headache).    [provider]  B-D UF III MINI PEN NEEDLES 31G X 5 MM MISC 2 (two) times daily. as directed 10/31/18   [provider]  Blood Glucose Monitoring Suppl (ACCU-CHEK GUIDE) w/Device KIT See admin instructions. 11/12/19   [provider]  co-enzyme Q-10 50 MG capsule TAKE 1 CAPSULE BY MOUTH EVERY DAY 11/30/19   Liza Riggers, MD  diltiazem  (CARDIZEM  CD) 360 MG 24 hr capsule Take 1 capsule (360 mg total) by mouth daily. 09/23/23   Camnitz, Babetta Lesch, MD  ELIQUIS  5 MG TABS tablet TAKE 1 TABLET BY MOUTH TWICE A DAY 12/08/23   Camnitz, Babetta Lesch, MD  furosemide  (LASIX ) 40 MG tablet Take 1 tablet (40 mg total) by mouth daily as needed for fluid or edema. 03/15/21   Arty Binning, MD  levothyroxine (SYNTHROID, LEVOTHROID) 50 MCG tablet Take 50 mcg by mouth daily before breakfast.  05/14/17   [provider]  metoprolol  succinate (TOPROL -XL) 50 MG 24 hr tablet TAKE 1.5 TABLETS (75 MG TOTAL) BY MOUTH AT BEDTIME. TAKE WITH OR IMMEDIATELY FOLLOWING A MEAL. 02/27/24   Camnitz, Babetta Lesch, MD  nitroGLYCERIN  (NITROSTAT ) 0.4 MG SL tablet Place 0.4 mg under the tongue every 5 (five) minutes as needed for chest pain.    [provider]  NOVOLOG  MIX 70/30 FLEXPEN (70-30) 100 UNIT/ML FlexPen Inject 15-20 Units into the skin daily with breakfast. Take 15 units in the morning and 20 units in the eveing 04/22/22   [provider]  rosuvastatin  (CRESTOR ) 20 MG tablet Take 20 mg by mouth daily. 03/06/23   [provider]  spironolactone  (ALDACTONE ) 25 MG tablet TAKE 1 TABLET (25 MG TOTAL) BY MOUTH DAILY. 07/23/22   Tylene Galla, PA-C  tirzepatide (MOUNJARO) 7.5 MG/0.5ML Pen Inject 7.5 mg into the skin once a week. 09/29/23   [provider]  tiZANidine  (ZANAFLEX ) 4 MG tablet Take 4 mg by mouth every 6 (six) hours as needed for muscle spasms.    [provider]  traMADol  (ULTRAM ) 50 MG tablet Take 50 mg by mouth every 6 (six) hours as needed for moderate pain (pain score 4-6).    [provider]    Allergies: Hydrocodone , Morphine  and codeine , Canagliflozin, Metformin hcl, Rosuvastatin , Lisinopril , and Nsaids    Review of Systems  Gastrointestinal:  Positive for abdominal pain.  All other systems reviewed  and are negative.   Updated Vital Signs BP (!) 160/57   Pulse 73   Temp 97.9 F (36.6 C)   Resp 19   Ht 5' 8 (1.727 m)   Wt 98 kg   SpO2 97%   BMI 32.84 kg/m   Physical Exam Vitals and nursing note reviewed.  Constitutional:      General: She is not in acute distress.    Appearance: She is well-developed.  HENT:     Head: Normocephalic and atraumatic.   Eyes:     Conjunctiva/sclera: Conjunctivae normal.    Cardiovascular:     Rate and Rhythm: Normal rate and regular rhythm.     Heart sounds: No murmur  heard. Pulmonary:     Effort: Pulmonary effort is normal. No respiratory distress.     Breath sounds: Normal breath sounds.  Abdominal:     Palpations: Abdomen is soft.     Tenderness: There is generalized abdominal tenderness. There is guarding.   Musculoskeletal:        General: No swelling.     Cervical back: Neck supple.   Skin:    General: Skin is warm and dry.     Capillary Refill: Capillary refill takes less than 2 seconds.   Neurological:     Mental Status: She is alert.   Psychiatric:        Mood and Affect: Mood normal.     (all labs ordered are listed, but only abnormal results are displayed) Labs Reviewed  COMPREHENSIVE METABOLIC PANEL WITH GFR - Abnormal; Notable for the following components:      Result Value   Glucose, Bld 116 (*)    Creatinine, Ser 1.05 (*)    GFR, Estimated 59 (*)    All other components within normal limits  CBC - Abnormal; Notable for the following components:   RBC 3.86 (*)    Hemoglobin 11.9 (*)    All other components within normal limits  URINALYSIS, ROUTINE W REFLEX MICROSCOPIC - Abnormal; Notable for the following components:   APPearance HAZY (*)    All other components within normal limits  LIPASE, BLOOD    EKG: None  Radiology: No results found.   Procedures   Medications Ordered in the ED  fentaNYL  (SUBLIMAZE ) injection 25 mcg (25 mcg Intravenous Given 04/08/24 2134)  ondansetron  (ZOFRAN ) injection 4 mg (4 mg Intravenous Given 04/08/24 2133)  iohexol  (OMNIPAQUE ) 300 MG/ML solution 100 mL (100 mLs Intravenous Contrast Given 04/08/24 2200)    Clinical Course as of 04/08/24 2203  Thu Apr 08, 2024  2156 Abdominal pain, hx of diverticulitis, onset last night. Feels like her diverticulitis. No vaginal discomfort or urinary symptoms. No nausea, vomiting, diarrhea.  [CP]    Clinical Course User Index [CP] Nelly Banco, PA-C                                 Medical Decision Making Amount and/or Complexity of  Data Reviewed Labs: ordered. Radiology: ordered.  Risk Prescription drug management.   This patient presents to the ED for concern of abdominal pain.  Differential diagnosis includes bowel obstruction, diverticulitis, abdominal mass, obstructive uropathy, pyelonephritis   Lab Tests:  I Ordered, and personally interpreted labs.  The pertinent results include: CBC with hemoglobin at 11.9 consistent with priors, CMP unremarkable, lipase unremarkable, urinalysis negative for infection   Imaging Studies ordered:  I ordered imaging studies including CT abdomen pelvis I  independently visualized and interpreted imaging which showed pending at time of signout I agree with the radiologist interpretation   Medicines ordered and prescription drug management:  I ordered medication including fentanyl , Zofran  for pain, nausea Reevaluation of the patient after these medicines showed that the patient improved I have reviewed the patients home medicines and have made adjustments as needed   Problem List / ED Course:  Patient presents the emergency department today with concerns of diffuse abdominal pain.  Past history significant for type 2 diabetes, obesity, anemia, hypertension, CHF, and diverticulitis.  She reports that her pain currently feels like prior episodes of diverticulitis although describes the pain as generalized in nature.  No reported urinary changes. On exam, patient has diffuse abdominal tenderness.  Bowel sounds are slightly hyperactive.  No flank tenderness.  Heart and lung sounds unremarkable.  Based on presentation, CT imaging added on from basic labs from triage.  Fentanyl  and Zofran  administered for pain control.  Concern for likely abdominal etiology of symptoms.  Doubtful of urinary source. Basic labs are reassuring.  CBC shows hemoglobin 11.9 fairly consistent with priors.  CMP with no evident AKI or renal dysfunction.  Lipase unremarkable.  UA unremarkable.  CT abdomen  pelvis pending.  10:03 PM Care of [name] transferred to [facility] and Dr. Morris Arch at the end of my shift as the patient will require reassessment once labs/imaging have resulted. Patient presentation, ED course, and plan of care discussed with review of all pertinent labs and imaging. Please see his/her note for further details regarding further ED course and disposition. Plan at time of handoff is disposition per results of CT of the pelvis.  Not sick appearing but may consider admission for pain control pain is not improving if CT is unremarkable. This may be altered or completely changed at the discretion of the oncoming team pending results of further workup.  Final diagnoses:  None    ED Discharge Orders     None          Oretha Birch 04/08/24 2203    Trish Furl, MD 04/09/24 0005

## 2024-04-08 NOTE — ED Provider Notes (Incomplete)
 Accepted handoff at shift change from Ariel Zelaya PA-C. Please see prior provider note for more detail.   Briefly: Patient is 68 y.o.   DDX: concern for Abdominal pain, hx of diverticulitis, onset last night. Feels like her diverticulitis. No vaginal discomfort or urinary symptoms. No nausea, vomiting, diarrhea.   Plan: I independently interpreted imaging including CT abdomen and pelvis which shows  1. No acute findings.  2. Left colonic diverticulosis, without evidence of diverticulitis.  . I agree with the radiologist interpretation.  Reassessed, still having some pain, given Bentyl, fentanyl , pain improved.  Suspect that she may be having some intestinal cramping related to constipation, no evidence of acute infection at this time.  Pain improved on reassessment although not entirely resolved, stable for discharge at this time.   Nelly Banco, [place] 04/09/24 [phone]

## 2024-04-08 NOTE — Discharge Instructions (Signed)
 You can use the muscle relaxants and Bentyl that we have prescribed, follow-up closely with your primary care doctor.  There is no evidence of diverticulitis or other infection in the abdomen today.

## 2024-04-08 NOTE — ED Triage Notes (Signed)
 Pt c/o generalized abdominal pain and n/v starting last night.  Pain score 10/10.  Pt reports Hx of diverticulitis.  Pt reports it feels the same as her previous flare.

## 2024-04-10 NOTE — H&P (Signed)
 An H&P was performed prior to procedure.  For CDI Query purposes, it must be re entered:    Santo Stanly LABOR, MD Physician Cardiology   Interval H&P Note    Signed   Date of Service: 04/01/2024  9:37 AM   Signed      History and Physical Interval Note:   04/01/2024 9:37 AM   [name]  has presented today for surgery, with the diagnosis of AFIB.  The various methods of treatment have been discussed with the patient and family. After consideration of risks, benefits and other options for treatment, the patient has consented to  Procedure(s): TRANSESOPHAGEAL ECHOCARDIOGRAM (N/A) as a surgical intervention.  The patient's history has been reviewed, patient examined, no change in status, stable for surgery.  I have reviewed the patient's chart and labs.  Questions were answered to the patient's satisfaction.     We have discussed her care in the setting of her barium swallow, prior EGDs, prior malignancy history.  We have discussed care with Ms Craig CARE.  We have discussed the risks and benefits of a variety of plans and have planned for the following: If moderate AI- planned for CMR in one year If severe AI with AS- planned for structural eval If isolated severe AI, will plan for [facility] and Surgical evaluation. If unable to safely pass an X-11 probe, will plan for CMR and will send message to Ms Craig for potential further GI interventions.   Stanly Santo, MD FASE [facility] Cardiologist [facility]  [address] [place], [place] [address] [phone]  9:39 AM                Electronically signed by Santo Stanly LABOR, MD at 04/01/2024  9:39 AM Source Note    Signed     Expand All Collapse All       Cardiology Clinic Note    Patient Name: [name] Date of Encounter: 03/12/2024   Primary Care Provider:  Vernon Velna SAUNDERS, MD Primary Cardiologist:  Stanly LABOR Santo, MD   Patient Profile    [name]  68 year old female presents to the clinic today for evaluation of her persistent dysphagia and pre-SAVR evaluation.   Past Medical History        Past Medical History:  Diagnosis Date   Allergy     Anemia     Aortic insufficiency      a. Mod by echo 05/2015.   Arthritis      knees (07/11/2015)   Atrial fibrillation (HCC)      On Eliquis    Carcinoma of hilus of lung (HCC) 01/20/2012    IIIB NSCL  Right hilar mass compressing esophagus/presenting with dysphagia Rx Surgery/RT/chemo dx January 2001   Coronary artery disease      a. Abnormal stress test -> LHc 06/2015 s/p DES to mCX and mLAD, residual D2 disease treated medially.   Diverticulitis     Erythema nodosum     Heart murmur     History of kidney stones     Hypertension     Insomnia     lung ca dx'd 10/1999    chemo/xrt comp 05/29/2000   Myalgia     Obesity     Pericardial effusion      a. 05/2015 Echo: EF 55-60%, no rwma, Gr 1 DD, no effusion - but an effusion was seen on CT which was felt to be increased in size compared to 12/2014 - pericardium  also thickened.    Pericarditis      a. Dx 05/2015.   Pneumothorax, spontaneous, tension 01/20/2012    October, 2010   PONV (postoperative nausea and vomiting)     Pulmonary fibrosis (HCC) 01/20/2012    Due to previous surgery and chest radiation for lung cancer   Type II diabetes mellitus [facility])               Past Surgical History:  Procedure Laterality Date   ABDOMINAL HYSTERECTOMY   10/21/2006   APPENDECTOMY   1969?   ATRIAL FIBRILLATION ABLATION N/A 02/27/2022    Procedure: ATRIAL FIBRILLATION ABLATION;  Surgeon: Inocencio Soyla Lunger, MD;  Location: [facility] INVASIVE CV LAB;  Service: Cardiovascular;  Laterality: N/A;   BRONCHIAL WASHINGS   08/03/2021    Procedure: BRONCHIAL WASHINGS;  Surgeon: Jude Harden GAILS, MD;  Location: [facility] ENDOSCOPY;  Service: Cardiopulmonary;;   CARDIAC CATHETERIZATION N/A 07/11/2015    Procedure: Left Heart Cath and Coronary Angiography;  Surgeon:  Candyce GORMAN Reek, MD;  Location: [facility] INVASIVE CV LAB;  Service: Cardiovascular;  Laterality: N/A;   CARDIAC CATHETERIZATION   07/11/2015    Procedure: Coronary Stent Intervention;  Surgeon: Candyce GORMAN Reek, MD;  Location: [facility] INVASIVE CV LAB;  Service: Cardiovascular;;   CARPAL TUNNEL RELEASE Bilateral 1998-2005?    right-left   CARPAL TUNNEL RELEASE Left 2009?    for the 2nd time   CHOLECYSTECTOMY N/A 10/01/2022    Procedure: LAPAROSCOPIC CHOLECYSTECTOMY;  Surgeon: Signe Mitzie LABOR, MD;  Location: [facility] ORS;  Service: General;  Laterality: N/A;   COLONOSCOPY       COLONOSCOPY WITH PROPOFOL  N/A 05/28/2018    Procedure: COLONOSCOPY WITH PROPOFOL ;  Surgeon: Teressa Toribio SQUIBB, MD;  Location: [facility] ENDOSCOPY;  Service: Endoscopy;  Laterality: N/A;   CORONARY ANGIOPLASTY       CYSTOSCOPY W/ STONE MANIPULATION   10/22/2011   ESOPHAGOGASTRODUODENOSCOPY (EGD) WITH ESOPHAGEAL DILATION   10/21/2010   FOOT SURGERY Bilateral 06/22/1979    Callous removed    INNER EAR SURGERY Right ~ 2009   KNEE ARTHROPLASTY Right 10/21/1989   KNEE ARTHROSCOPY   ~ 2000    LATERAL RELEASE   LEFT HEART CATH AND CORONARY ANGIOGRAPHY N/A 12/18/2016    Procedure: Left Heart Cath and Coronary Angiography;  Surgeon: Lonni JONETTA Cash, MD;  Location: [facility] INVASIVE CV LAB;  Service: Cardiovascular;  Laterality: N/A;   LEFT HEART CATH AND CORONARY ANGIOGRAPHY N/A 02/01/2021    Procedure: LEFT HEART CATH AND CORONARY ANGIOGRAPHY;  Surgeon: Anner Alm ORN, MD;  Location: [facility] INVASIVE CV LAB;  Service: Cardiovascular;  Laterality: N/A;   LUNG CANCER SURGERY Left 10/22/1999    small cell   LUNG SURGERY Right 10/21/2005    reinflated it   PULMONARY EMBOLISM SURGERY   10/21/2005    LUNG COLLAPSE   RIGHT AND LEFT HEART CATH N/A 06/25/2017    Procedure: RIGHT AND LEFT HEART CATH;  Surgeon: Wonda Sharper, MD;  Location: [facility] INVASIVE CV LAB;  Service: Cardiovascular;  Laterality: N/A;   SHOULDER ADHESION RELEASE Right ~  2004   SHOULDER ARTHROSCOPY W/ ROTATOR CUFF REPAIR Right ~ 2003   TOTAL KNEE ARTHROPLASTY Left 04/14/2023    Procedure: TOTAL KNEE ARTHROPLASTY;  Surgeon: Yvone Rush, MD;  Location: [facility] ORS;  Service: Orthopedics;  Laterality: Left;   UPPER GASTROINTESTINAL ENDOSCOPY   06/05/2021   VIDEO BRONCHOSCOPY N/A 08/03/2021    Procedure: VIDEO BRONCHOSCOPY WITHOUT FLUORO;  Surgeon: Jude Harden GAILS, MD;  Location: [facility] ENDOSCOPY;  Service: Cardiopulmonary;  Laterality: N/A;  [] Expand by Default        Allergies   Allergies       Allergies  Allergen Reactions   Hydrocodone  Hives and Itching   Morphine  And Codeine  Other (See Comments)      GI PROBLEMS   Canagliflozin Diarrhea      Other Reaction(s): diarrhea   Metformin Hcl Diarrhea   Rosuvastatin  Other (See Comments)      Pt reports causes lower extremity muscle aches/cramping   Other Reaction(s): muscle cramps   Lisinopril  Cough      Other Reaction(s): cough   Nsaids Other (See Comments)      GI PROBLEMS   Other Reaction(s): GI problems        History of Present Illness    [name] has a PMH of coronary artery disease (PCI with DES to mid circumflex and mid LAD 9/16.  She underwent subsequent cardiac catheterization 2/18 and was noted to have patent stents), chronic pericardial effusion, chronic pericarditis, pericardial rub, (intermittently treated with steroids and colchicine ), chronic CHF, diabetes, HTN, COPD, pulmonary fibrosis, atrial fibrillation, and mild AI.  Echo cardiogram 02/05/2023 showed an LVEF of 55-60%, intermediate diastolic parameters, normal mitral valve, moderate aortic valve regurgitation with no aortic stenosis and a peak gradient of 22 mmHg.   She is post ablation and saw Dr. Inocencio 8/23.  She denied.  Current episodes of atrial fibrillation.  At that time she wanted to discontinue Tikosyn  and it was stopped.   She followed up with Charlies Arthur PA-C on 12/11/2023.  She was doing well at that time.  She denied  chest pain shortness of breath.  She had no new symptoms that were not related to atrial fibrillation.  She did note lightheadedness with standing or after rising from a bent position.  She was encouraged to increase her p.o. hydration.  Of note she is not a candidate for amiodarone due to known pulmonary fibrosis.   She was contacted by Dr. Santo on 01/28/2024.  She reported rare fatigue and persistent dysphagia.  TEE and LHC/RHC for surgical evaluation were discussed.   She presents to the clinic today for evaluation and states she notices increased fatigue with daily activities such as making her bed and taking shower.  She has not been able to perform much physical activity because of this.  She also notes intermittent episodes of dizziness that last for several minutes and dissipate without intervention.  We reviewed her recent echocardiogram and her aortic stenosis.  She presents with her sister.  They expressed understanding.  Her blood pressure today is 110/50.  Her EKG shows sinus rhythm 72 bpm.  I will order right and left heart cath, TEE, CBC, BMP and plan follow-up as scheduled after testing.   Today she denies  lower extremity edema, fatigue, palpitations, melena, hematuria, hemoptysis, diaphoresis,  presyncope, syncope, orthopnea, and PND.       Home Medications           Prior to Admission medications   Medication Sig Start Date End Date Taking? Authorizing Provider  ACCU-CHEK GUIDE test strip   05/23/22     [provider]  Accu-Chek Softclix Lancets lancets USE LANCETS 3 TIMES A DAY AS DIRECTED 30 DAYS 11/12/19     [provider]  acetaminophen  (TYLENOL ) 650 MG CR tablet Take 1,300 mg by mouth every 8 (eight) hours as needed for pain (headache).       [provider]  B-D UF III MINI PEN NEEDLES 31G X 5 MM MISC 2 (two) times daily. as directed 10/31/18     [provider]  Blood Glucose Monitoring Suppl (ACCU-CHEK GUIDE) w/Device KIT See  admin instructions. 11/12/19     [provider]  co-enzyme Q-10 50 MG capsule TAKE 1 CAPSULE BY MOUTH EVERY DAY 11/30/19     Maranda Leim DEL, MD  diltiazem  (CARDIZEM  CD) 360 MG 24 hr capsule Take 1 capsule (360 mg total) by mouth daily. 09/23/23     Camnitz, Soyla Lunger, MD  ELIQUIS  5 MG TABS tablet TAKE 1 TABLET BY MOUTH TWICE A DAY 12/08/23     Camnitz, Soyla Lunger, MD  furosemide  (LASIX ) 40 MG tablet Take 1 tablet (40 mg total) by mouth daily as needed for fluid or edema. 03/15/21     Claudene Victory ORN, MD  levothyroxine (SYNTHROID, LEVOTHROID) 50 MCG tablet Take 50 mcg by mouth daily before breakfast.  05/14/17     [provider]  metoprolol  succinate (TOPROL -XL) 50 MG 24 hr tablet TAKE 1.5 TABLETS (75 MG TOTAL) BY MOUTH AT BEDTIME. TAKE WITH OR IMMEDIATELY FOLLOWING A MEAL. 02/27/24     Camnitz, Soyla Lunger, MD  [facility] 5 MG/0.5ML Pen Inject 5 mg into the skin once a week. 09/29/23     [provider]  nitroGLYCERIN  (NITROSTAT ) 0.4 MG SL tablet Place 0.4 mg under the tongue every 5 (five) minutes as needed for chest pain.       [provider]  NOVOLOG  MIX 70/30 FLEXPEN (70-30) 100 UNIT/ML FlexPen Inject 30 Units into the skin daily with breakfast. Also, 35 units in the evening 04/22/22     [provider]  rosuvastatin  (CRESTOR ) 20 MG tablet Take 20 mg by mouth daily. 03/06/23     [provider]  spironolactone  (ALDACTONE ) 25 MG tablet TAKE 1 TABLET (25 MG TOTAL) BY MOUTH DAILY. 07/23/22     Lesia Ozell Barter, PA-C      Family History         Family History  Problem Relation Age of Onset   Heart disease Father     Diabetes Father     Pancreatic cancer Mother     Diabetes Mother     Hypertension Sister          x 3   Diabetes Sister          x 4   Diabetes Maternal Grandmother     Diabetes Paternal Grandmother     Diabetes Sister     Diabetes Sister     Diabetes Sister     Kidney disease Sister     Colon cancer Neg Hx     Colon  polyps Neg Hx     Rectal cancer Neg Hx     Stomach cancer Neg Hx     Esophageal cancer Neg Hx     Breast cancer Neg Hx          She indicated that her mother is deceased. She indicated that her father is deceased. She indicated that five of her six sisters are alive. She indicated that her brother is alive. She indicated that her maternal grandmother is deceased. She indicated that her maternal grandfather is deceased. She indicated that her paternal grandmother is deceased. She indicated that her paternal grandfather is deceased. She indicated that the status of her neg hx is unknown.   Social History    Social History  Socioeconomic History   Marital status: Single      Spouse name: Not on file   Number of children: 0   Years of education: Not on file   Highest education level: Not on file  Occupational History   Occupation: RETIRED  Tobacco Use   Smoking status: Former      Current packs/day: 0.00      Average packs/day: 0.3 packs/day for 5.0 years (1.3 ttl pk-yrs)      Types: Cigarettes      Start date: 07/21/1994      Quit date: 07/22/1999      Years since quitting: 24.6   Smokeless tobacco: Never   Tobacco comments:      Former smoker 11/28/22  Vaping Use   Vaping status: Never Used  Substance and Sexual Activity   Alcohol use: No      Alcohol/week: 0.0 standard drinks of alcohol   Drug use: No   Sexual activity: Yes      Birth control/protection: Other-see comments      Comment: hysterectomy  Other Topics Concern   Not on file  Social History Narrative   Not on file    Social Drivers of Health        Financial Resource Strain: Low Risk  (07/12/2021)    Overall Financial Resource Strain (CARDIA)     Difficulty of Paying Living Expenses: Not hard at all  Food Insecurity: No Food Insecurity (04/14/2023)    Hunger Vital Sign     Worried About Running Out of Food in the Last Year: Never true     Ran Out of Food in the Last Year: Never true   Transportation Needs: No Transportation Needs (04/14/2023)    PRAPARE - Therapist, art (Medical): No     Lack of Transportation (Non-Medical): No  Physical Activity: Inactive (07/12/2021)    Exercise Vital Sign     Days of Exercise per Week: 0 days     Minutes of Exercise per Session: 0 min  Stress: No Stress Concern Present (07/12/2021)    [employer] of Occupational Health - Occupational Stress Questionnaire     Feeling of Stress : Not at all  Social Connections: Not on file  Intimate Partner Violence: Not At Risk (04/14/2023)    Humiliation, Afraid, Rape, and Kick questionnaire     Fear of Current or Ex-Partner: No     Emotionally Abused: No     Physically Abused: No     Sexually Abused: No      Review of Systems    General:  No chills, fever, night sweats or weight changes.  Cardiovascular:  No chest pain, dyspnea on exertion, edema, orthopnea, palpitations, paroxysmal nocturnal dyspnea. Dermatological: No rash, lesions/masses Respiratory: No cough, dyspnea Urologic: No hematuria, dysuria Abdominal:   No nausea, vomiting, diarrhea, bright red blood per rectum, melena, or hematemesis Neurologic:  No visual changes, wkns, changes in mental status. All other systems reviewed and are otherwise negative except as noted above.   Physical Exam    VS:  BP (!) 110/50   Pulse 72   Ht 5' 7.5 (1.715 m)   Wt 218 lb 6.4 oz (99.1 kg)   SpO2 98%   BMI 33.70 kg/m  , BMI Body mass index is 33.7 kg/m. GEN: Well nourished, well developed, in no acute distress. HEENT: normal. Neck: Supple, no JVD, carotid bruits, or masses. Cardiac: RRR, 3-4 out of 6 systolic murmur heard along right sternal  border, rubs, or gallops. No clubbing, cyanosis, edema.  Radials/DP/PT 2+ and equal bilaterally.  Respiratory:  Respirations regular and unlabored, clear to auscultation bilaterally. GI: Soft, nontender, nondistended, BS + x 4. MS: no deformity or atrophy. Skin: warm  and dry, no rash. Neuro:  Strength and sensation are intact. Psych: Normal affect.   Accessory Clinical Findings    Recent Labs: 06/21/2023: ALT 9; BUN 17; Creatinine, Ser 0.90; Hemoglobin 11.2; Platelets 290; Potassium 4.6; Sodium 141    Recent Lipid Panel Labs (Brief)          Component Value Date/Time    CHOL 127 11/27/2021 0408    CHOL 130 04/25/2021 1614    TRIG 72 11/27/2021 0408    HDL 44 11/27/2021 0408    HDL 44 04/25/2021 1614    CHOLHDL 2.9 11/27/2021 0408    VLDL 14 11/27/2021 0408    LDLCALC 69 11/27/2021 0408    LDLCALC 67 04/25/2021 1614               ECG personally reviewed by me today- EKG Interpretation Date/Time:                  Friday Mar 12 2024 11:19:07 EDT Ventricular Rate:         72 PR Interval:                 124 QRS Duration:             96 QT Interval:                 418 QTC Calculation:457 R Axis:                         74   Text Interpretation:Normal sinus rhythm Normal ECG When compared with ECG of 29-May-2023 14:08, No significant change was found Confirmed by Emelia Hazy [phone]) on 03/12/2024 11:24:35 AM      Echocardiogram 01/26/2024   IMPRESSIONS     1. The aortic valve is tricuspid. Aortic valve regurgitation is severe.  Holodiastolic backflow seen in proximal descending aorta   2. Left ventricular ejection fraction, by estimation, is 65 to 70%. The  left ventricle has normal function. The left ventricle has no regional  wall motion abnormalities. Left ventricular diastolic parameters were  normal.   3. Right ventricular systolic function is normal. The right ventricular  size is normal.   FINDINGS   Left Ventricle: Left ventricular ejection fraction, by estimation, is 65  to 70%. The left ventricle has normal function. The left ventricle has no  regional wall motion abnormalities. 3D ejection fraction reviewed and  evaluated as part of the  interpretation. Alternate measurement of EF is felt to be most reflective  of  LV function. The left ventricular internal cavity size was normal in  size. There is no left ventricular hypertrophy. Left ventricular diastolic  parameters were normal.   Right Ventricle: The right ventricular size is normal. Right vetricular  wall thickness was not assessed. Right ventricular systolic function is  normal.   Left Atrium: Left atrial size was normal in size.   Right Atrium: Right atrial size was normal in size.   Pericardium: Trivial pericardial effusion is present.   Mitral Valve: The mitral valve is normal in structure. Trivial mitral  valve regurgitation.   Tricuspid Valve: The tricuspid valve is normal in structure. Tricuspid  valve regurgitation is mild.   Aortic Valve: Holodiastolic backflow present in  proximal descending aorta.  The aortic valve is tricuspid. Aortic valve regurgitation is severe.  Aortic regurgitation PHT measures 307 msec.   Pulmonic Valve: The pulmonic valve was grossly normal. Pulmonic valve  regurgitation is mild.   Aorta: The aortic root and ascending aorta are structurally normal, with  no evidence of dilitation.   Venous: The inferior vena cava is normal in size with greater than 50%  respiratory variability, suggesting right atrial pressure of 3 mmHg.   IAS/Shunts: No atrial level shunt detected by color flow Doppler.            Assessment & Plan    1.  Severe AI-continues to note fatigue and DOE.  3-4 systolic murmur heard along right sternal border.  Previously contacted by Dr. Santo and reviewed need for right and left heart cath along with TEE.  Euvolemic.  Weight stable. Proceed with cardiac catheterization and TEE. She may hold Eliquis  for 2 days prior to her procedure. She is high risk for TEE due to dysphagia.  She wishes to proceed.   Informed Consent Shared Decision Making/Informed Consent The risks [stroke (1 in 1000), death (1 in 1000), kidney failure [usually temporary] (1 in 500), bleeding (1 in  200), allergic reaction [possibly serious] (1 in 200)], benefits (diagnostic support and management of coronary artery disease) and alternatives of a cardiac catheterization were discussed in detail with Ms. Abbett and she is willing to proceed.         Atrial fibrillation-EKG today shows sinus rhythm 72 bpm.  Denies recent episodes of increased or accelerated/irregular heartbeat.  She is status post ablation.  Reports compliance with apixaban .  Denies bleeding issues. Avoid triggers caffeine , chocolate, EtOH, dehydration etc. Continue diltiazem , apixaban    Coronary artery disease-denies recent anginal type symptoms.  She is status post stenting to her circumflex and mid LAD 9/16.  Had subsequent catheterization 2/18 which showed patent stents. Continue rosuvastatin , metoprolol    Hyperlipidemia-LDL 60 on 12/10/2023. High-fiber diet Continue co-Q10, rosuvastatin    Chronic diastolic CHF-appears well compensated today. Daily weights Heart healthy low-sodium diet Continue Lasix , metoprolol , spironolactone    Disposition: Follow-up with Dr. Santo as scheduled.     Josefa HERO. Cleaver NP-C      03/12/2024, 11:38 AM [facility] [address] Office [phone] Fax [phone]       I spent 15 minutes examining this patient, reviewing medications, and using patient centered shared decision making involving their cardiac care.   I spent  20 minutes reviewing past medical history,  medications, and prior cardiac tests.          Electronically signed by Emelia Josefa HERO, NP at 03/12/2024 12:24 PM     Admission (Discharged) on 04/01/2024       Detailed Report      Note shared with patient Care Timeline  06/12   TRANSESOPHAGEAL ECHOCARDIOGRAM     Discharged 1104

## 2024-04-15 DIAGNOSIS — K5909 Other constipation: Secondary | ICD-10-CM | POA: Diagnosis not present

## 2024-04-15 DIAGNOSIS — E1165 Type 2 diabetes mellitus with hyperglycemia: Secondary | ICD-10-CM | POA: Diagnosis not present

## 2024-04-15 DIAGNOSIS — R1032 Left lower quadrant pain: Secondary | ICD-10-CM | POA: Diagnosis not present

## 2024-04-21 ENCOUNTER — Ambulatory Visit (HOSPITAL_BASED_OUTPATIENT_CLINIC_OR_DEPARTMENT_OTHER)
Admission: RE | Admit: 2024-04-21 | Discharge: 2024-04-21 | Disposition: A | Discharge status: Home / Self Care | Source: Ambulatory Visit | Attending: Internal Medicine | Admitting: Internal Medicine

## 2024-04-21 DIAGNOSIS — E2839 Other primary ovarian failure: Secondary | ICD-10-CM | POA: Diagnosis not present

## 2024-04-21 DIAGNOSIS — Z780 Asymptomatic menopausal state: Secondary | ICD-10-CM | POA: Diagnosis not present

## 2024-04-21 DIAGNOSIS — M8589 Other specified disorders of bone density and structure, multiple sites: Secondary | ICD-10-CM | POA: Diagnosis not present

## 2024-04-22 ENCOUNTER — Other Ambulatory Visit: Payer: Self-pay

## 2024-04-22 ENCOUNTER — Emergency Department (HOSPITAL_COMMUNITY)
Admission: EM | Admit: 2024-04-22 | Discharge: 2024-04-23 | Disposition: A | Discharge status: Home / Self Care | Attending: Emergency Medicine | Admitting: Emergency Medicine

## 2024-04-22 ENCOUNTER — Encounter (HOSPITAL_COMMUNITY): Payer: Self-pay

## 2024-04-22 DIAGNOSIS — Z85118 Personal history of other malignant neoplasm of bronchus and lung: Secondary | ICD-10-CM | POA: Diagnosis not present

## 2024-04-22 DIAGNOSIS — R1032 Left lower quadrant pain: Secondary | ICD-10-CM | POA: Insufficient documentation

## 2024-04-22 DIAGNOSIS — D649 Anemia, unspecified: Secondary | ICD-10-CM | POA: Insufficient documentation

## 2024-04-22 DIAGNOSIS — R7989 Other specified abnormal findings of blood chemistry: Secondary | ICD-10-CM | POA: Insufficient documentation

## 2024-04-22 DIAGNOSIS — I10 Essential (primary) hypertension: Secondary | ICD-10-CM | POA: Diagnosis not present

## 2024-04-22 DIAGNOSIS — R1033 Periumbilical pain: Secondary | ICD-10-CM | POA: Insufficient documentation

## 2024-04-22 DIAGNOSIS — K5730 Diverticulosis of large intestine without perforation or abscess without bleeding: Secondary | ICD-10-CM | POA: Diagnosis not present

## 2024-04-22 DIAGNOSIS — Q600 Renal agenesis, unilateral: Secondary | ICD-10-CM | POA: Diagnosis not present

## 2024-04-22 DIAGNOSIS — R1031 Right lower quadrant pain: Secondary | ICD-10-CM | POA: Diagnosis not present

## 2024-04-22 DIAGNOSIS — Z79899 Other long term (current) drug therapy: Secondary | ICD-10-CM | POA: Insufficient documentation

## 2024-04-22 DIAGNOSIS — R739 Hyperglycemia, unspecified: Secondary | ICD-10-CM

## 2024-04-22 DIAGNOSIS — Z7901 Long term (current) use of anticoagulants: Secondary | ICD-10-CM | POA: Diagnosis not present

## 2024-04-22 DIAGNOSIS — N289 Disorder of kidney and ureter, unspecified: Secondary | ICD-10-CM | POA: Insufficient documentation

## 2024-04-22 DIAGNOSIS — I129 Hypertensive chronic kidney disease with stage 1 through stage 4 chronic kidney disease, or unspecified chronic kidney disease: Secondary | ICD-10-CM | POA: Diagnosis not present

## 2024-04-22 DIAGNOSIS — E1165 Type 2 diabetes mellitus with hyperglycemia: Secondary | ICD-10-CM | POA: Insufficient documentation

## 2024-04-22 DIAGNOSIS — R1013 Epigastric pain: Secondary | ICD-10-CM | POA: Insufficient documentation

## 2024-04-22 DIAGNOSIS — I2510 Atherosclerotic heart disease of native coronary artery without angina pectoris: Secondary | ICD-10-CM | POA: Insufficient documentation

## 2024-04-22 DIAGNOSIS — R1012 Left upper quadrant pain: Secondary | ICD-10-CM | POA: Diagnosis not present

## 2024-04-22 DIAGNOSIS — R1030 Lower abdominal pain, unspecified: Secondary | ICD-10-CM | POA: Diagnosis present

## 2024-04-22 LAB — CBC
HCT: 33.6 % — ABNORMAL LOW (ref 36.0–46.0)
Hemoglobin: 10.7 g/dL — ABNORMAL LOW (ref 12.0–15.0)
MCH: 31.1 pg (ref 26.0–34.0)
MCHC: 31.8 g/dL (ref 30.0–36.0)
MCV: 97.7 fL (ref 80.0–100.0)
Platelets: 274 10*3/uL (ref 150–400)
RBC: 3.44 MIL/uL — ABNORMAL LOW (ref 3.87–5.11)
RDW: 11.9 % (ref 11.5–15.5)
WBC: 7 10*3/uL (ref 4.0–10.5)
nRBC: 0 % (ref 0.0–0.2)

## 2024-04-22 NOTE — ED Triage Notes (Signed)
 Pt reports lower medial abd pain x2 weeks. Seen recently for same. Pt states that she feels pressure when she moves her legs.

## 2024-04-23 ENCOUNTER — Encounter (HOSPITAL_COMMUNITY): Payer: Self-pay

## 2024-04-23 ENCOUNTER — Emergency Department (HOSPITAL_COMMUNITY)

## 2024-04-23 DIAGNOSIS — Q600 Renal agenesis, unilateral: Secondary | ICD-10-CM | POA: Diagnosis not present

## 2024-04-23 DIAGNOSIS — R1012 Left upper quadrant pain: Secondary | ICD-10-CM | POA: Diagnosis not present

## 2024-04-23 DIAGNOSIS — K5730 Diverticulosis of large intestine without perforation or abscess without bleeding: Secondary | ICD-10-CM | POA: Diagnosis not present

## 2024-04-23 LAB — COMPREHENSIVE METABOLIC PANEL WITH GFR
ALT: 12 U/L (ref 0–44)
AST: 19 U/L (ref 15–41)
Albumin: 3.8 g/dL (ref 3.5–5.0)
Alkaline Phosphatase: 101 U/L (ref 38–126)
Anion gap: 7 (ref 5–15)
BUN: 19 mg/dL (ref 8–23)
CO2: 27 mmol/L (ref 22–32)
Calcium: 8.6 mg/dL — ABNORMAL LOW (ref 8.9–10.3)
Chloride: 105 mmol/L (ref 98–111)
Creatinine, Ser: 1.07 mg/dL — ABNORMAL HIGH (ref 0.44–1.00)
GFR, Estimated: 58 mL/min — ABNORMAL LOW (ref >60)
Glucose, Bld: 126 mg/dL — ABNORMAL HIGH (ref 70–99)
Potassium: 3.5 mmol/L (ref 3.5–5.1)
Sodium: 139 mmol/L (ref 135–145)
Total Bilirubin: 0.3 mg/dL (ref 0.0–1.2)
Total Protein: 7.3 g/dL (ref 6.5–8.1)

## 2024-04-23 LAB — URINALYSIS, ROUTINE W REFLEX MICROSCOPIC
Bilirubin Urine: NEGATIVE
Glucose, UA: 50 mg/dL — AB
Hgb urine dipstick: NEGATIVE
Ketones, ur: NEGATIVE mg/dL
Leukocytes,Ua: NEGATIVE
Nitrite: NEGATIVE
Protein, ur: NEGATIVE mg/dL
Specific Gravity, Urine: 1.028 (ref 1.005–1.030)
pH: 5 (ref 5.0–8.0)

## 2024-04-23 LAB — LIPASE, BLOOD: Lipase: 38 U/L (ref 11–51)

## 2024-04-23 MED ORDER — ONDANSETRON HCL 4 MG/2ML IJ SOLN
4.0000 mg | Freq: Once | INTRAMUSCULAR | Status: AC
  Administered 2024-04-23: 4 mg via INTRAVENOUS
  Filled 2024-04-23: qty 2

## 2024-04-23 MED ORDER — HYDROMORPHONE HCL 1 MG/ML IJ SOLN
0.5000 mg | Freq: Once | INTRAMUSCULAR | Status: AC
  Administered 2024-04-23: 0.5 mg via INTRAVENOUS
  Filled 2024-04-23: qty 1

## 2024-04-23 MED ORDER — OXYCODONE HCL 5 MG PO TABS: 5.0000 mg | ORAL_TABLET | ORAL | 0 refills | Status: DC | PRN

## 2024-04-23 MED ORDER — IOHEXOL 350 MG/ML SOLN
100.0000 mL | Freq: Once | INTRAVENOUS | Status: AC | PRN
  Administered 2024-04-23: 100 mL via INTRAVENOUS

## 2024-04-23 NOTE — ED Provider Notes (Signed)
 [place] EMERGENCY DEPARTMENT AT [facility] Provider Note   CSN: [id number] Arrival date & time: 04/22/24  2315     Patient presents with: Abdominal Pain   [name] is a 68 y.o. female.   The history is provided by the patient.  Abdominal Pain She has history of hypertension, diabetes, coronary artery disease, pulmonary fibrosis, lung cancer, atrial fibrillation anticoagulated on apixaban  and comes in complaining of ongoing abdominal pain.  She states she has been having pain in her lower abdomen for about the last 2 weeks.  Pain starts in the umbilicus and then goes to both lower quadrants.  It is worse when she moves her legs.  She denies any radiation of pain.  There is associated nausea but no vomiting.  She has been having normal bowel movements and denies any difficulty with urination.  She had been seen in the emergency department 2 weeks ago and had a CT scan which showed diverticulosis but no diverticulitis.  She states that pain medicine does give her temporary relief, but pain recurs as soon as the medicine wears off.  She denies fever, chills, sweats.   Prior to Admission medications   Medication Sig Start Date End Date Taking? Authorizing Provider  ACCU-CHEK GUIDE test strip  05/23/22   [provider]  Accu-Chek Softclix Lancets lancets USE LANCETS 3 TIMES A DAY AS DIRECTED 30 DAYS 11/12/19   [provider]  acetaminophen  (TYLENOL ) 650 MG CR tablet Take 1,300 mg by mouth every 8 (eight) hours as needed for pain (headache).    [provider]  B-D UF III MINI PEN NEEDLES 31G X 5 MM MISC 2 (two) times daily. as directed 10/31/18   [provider]  Blood Glucose Monitoring Suppl (ACCU-CHEK GUIDE) w/Device KIT See admin instructions. 11/12/19   [provider]  co-enzyme Q-10 50 MG capsule TAKE 1 CAPSULE BY MOUTH EVERY DAY 11/30/19   Maranda Leim DEL, MD  dicyclomine  (BENTYL ) 20 MG tablet Take 1 tablet (20 mg total) by  mouth 2 (two) times daily. 04/08/24   Prosperi, Christian H, PA-C  diltiazem  (CARDIZEM  CD) 360 MG 24 hr capsule Take 1 capsule (360 mg total) by mouth daily. 09/23/23   Camnitz, Soyla Lunger, MD  ELIQUIS  5 MG TABS tablet TAKE 1 TABLET BY MOUTH TWICE A DAY 12/08/23   Camnitz, Soyla Lunger, MD  furosemide  (LASIX ) 40 MG tablet Take 1 tablet (40 mg total) by mouth daily as needed for fluid or edema. 03/15/21   Claudene Victory ORN, MD  levothyroxine (SYNTHROID, LEVOTHROID) 50 MCG tablet Take 50 mcg by mouth daily before breakfast.  05/14/17   [provider]  metoprolol  succinate (TOPROL -XL) 50 MG 24 hr tablet TAKE 1.5 TABLETS (75 MG TOTAL) BY MOUTH AT BEDTIME. TAKE WITH OR IMMEDIATELY FOLLOWING A MEAL. 02/27/24   Camnitz, Soyla Lunger, MD  nitroGLYCERIN  (NITROSTAT ) 0.4 MG SL tablet Place 0.4 mg under the tongue every 5 (five) minutes as needed for chest pain.    [provider]  NOVOLOG  MIX 70/30 FLEXPEN (70-30) 100 UNIT/ML FlexPen Inject 15-20 Units into the skin daily with breakfast. Take 15 units in the morning and 20 units in the eveing 04/22/22   [provider]  rosuvastatin  (CRESTOR ) 20 MG tablet Take 20 mg by mouth daily. 03/06/23   [provider]  spironolactone  (ALDACTONE ) 25 MG tablet TAKE 1 TABLET (25 MG TOTAL) BY MOUTH DAILY. 07/23/22   Lesia Ozell Barter, PA-C  tirzepatide [facility]) 7.5 MG/0.5ML  Pen Inject 7.5 mg into the skin once a week. 09/29/23   [provider]  tiZANidine  (ZANAFLEX ) 4 MG tablet Take 1 tablet (4 mg total) by mouth every 6 (six) hours as needed for muscle spasms. 04/08/24   Prosperi, Christian H, PA-C  traMADol  (ULTRAM ) 50 MG tablet Take 50 mg by mouth every 6 (six) hours as needed for moderate pain (pain score 4-6).    [provider]    Allergies: Hydrocodone , Morphine  and codeine , Canagliflozin, Metformin hcl, Rosuvastatin , Lisinopril , and Nsaids    Review of Systems  Gastrointestinal:  Positive for abdominal pain.  All  other systems reviewed and are negative.   Updated Vital Signs BP (!) 146/70   Pulse 66   Temp 97.7 F (36.5 C)   Resp 17   Ht 5' 8 (1.727 m)   Wt 98 kg   SpO2 100%   BMI 32.85 kg/m   Physical Exam Vitals and nursing note reviewed.   68 year old female, resting comfortably and in no acute distress. Vital signs are significant for mildly elevated blood pressure. Oxygen saturation is 100%, which is normal. Head is normocephalic and atraumatic. PERRLA, EOMI.  Neck is nontender and supple. Back is nontender and there is no CVA tenderness. Lungs are clear without rales, wheezes, or rhonchi. Chest is nontender. Heart has regular rate and rhythm with 3/6 systolic ejection murmur. Abdomen is soft, flat, with marked tenderness in the epigastric area, periumbilical area, both lower quadrants.  There is no rebound or guarding.  Pain is worse with hip flexion on either side but not with hip internal and external rotation. Extremities have no cyanosis or edema. Skin is warm and dry without rash. Neurologic: Awake and alert.  (all labs ordered are listed, but only abnormal results are displayed) Labs Reviewed  COMPREHENSIVE METABOLIC PANEL WITH GFR - Abnormal; Notable for the following components:      Result Value   Glucose, Bld 126 (*)    Creatinine, Ser 1.07 (*)    Calcium  8.6 (*)    GFR, Estimated 58 (*)    All other components within normal limits  CBC - Abnormal; Notable for the following components:   RBC 3.44 (*)    Hemoglobin 10.7 (*)    HCT 33.6 (*)    All other components within normal limits  URINALYSIS, ROUTINE W REFLEX MICROSCOPIC - Abnormal; Notable for the following components:   APPearance HAZY (*)    Glucose, UA 50 (*)    All other components within normal limits  LIPASE, BLOOD   Radiology: CT Angio Abd/Pel W and/or Wo Contrast Result Date: 04/23/2024 CLINICAL DATA:  Left upper quadrant abdominal pain EXAM: CTA ABDOMEN AND PELVIS WITHOUT AND WITH CONTRAST  TECHNIQUE: Multidetector CT imaging of the abdomen and pelvis was performed using the standard protocol during bolus administration of intravenous contrast. Multiplanar reconstructed images and MIPs were obtained and reviewed to evaluate the vascular anatomy. RADIATION DOSE REDUCTION: This exam was performed according to the departmental dose-optimization program which includes automated exposure control, adjustment of the mA and/or kV according to patient size and/or use of iterative reconstruction technique. CONTRAST:  OMNIPAQUE  IOHEXOL  350 MG/ML SOLN COMPARISON:  04/08/2024 FINDINGS: VASCULAR Normal contour and caliber of the abdominal aorta. No evidence of aneurysm, dissection, or other acute aortic pathology. Standard branching pattern of the abdominal aorta with solitary bilateral renal arteries. Minimal, scattered aortic atherosclerosis. Review of the MIP images confirms the above findings. NON-VASCULAR Lower Chest: No acute findings. Cardiomegaly.  Coronary artery calcifications. Hepatobiliary: No focal liver abnormality is seen. Status post cholecystectomy. Unchanged postoperative biliary ductal dilatation. Pancreas: Unremarkable. No pancreatic ductal dilatation or surrounding inflammatory changes. Spleen: Normal in size without significant abnormality. Adrenals/Urinary Tract: Adrenal glands are unremarkable. Kidneys are normal, without renal calculi, solid lesion, or hydronephrosis. Bladder is unremarkable. Stomach/Bowel: Stomach is within normal limits. Appendix not clearly visualized and may be surgically absent. No evidence of bowel wall thickening, distention, or inflammatory changes. Pancolonic diverticulosis, most notably in the descending and sigmoid. Lymphatic: No enlarged abdominal or pelvic lymph nodes. Reproductive: Hysterectomy. Other: No abdominal wall hernia or abnormality. No ascites. Musculoskeletal: No acute osseous findings. IMPRESSION: 1. Normal contour and caliber of the abdominal  aorta. No evidence of aneurysm, dissection, or other acute aortic pathology. Minimal, scattered aortic atherosclerosis. 2. Pancolonic diverticulosis, most notably in the descending and sigmoid. No evidence of acute diverticulitis. 3. Status post cholecystectomy. Unchanged postoperative biliary ductal dilatation. 4. Cardiomegaly and coronary artery disease. Aortic Atherosclerosis (ICD10-I70.0). Electronically Signed   By: Marolyn JONETTA Jaksch M.D.   On: 04/23/2024 06:32   DG BONE DENSITY (DXA) Result Date: 04/22/2024 EXAM: DUAL X-RAY ABSORPTIOMETRY (DXA) FOR BONE MINERAL DENSITY 04/21/2024 2:30 pm CLINICAL DATA:  68 year old Female Postmenopausal. Estrogen deficiency, osteoporosis screening TECHNIQUE: An axial (e.g., hips, spine) and/or appendicular (e.g., radius) exam was performed, as appropriate, using [employer] Secretary/administrator at [employer]. Images are obtained for bone mineral density measurement and are not obtained for diagnostic purposes. [id number] Exclusions: L4 due to degenerative changes COMPARISON:  None. FINDINGS: Scan quality: Good. LUMBAR SPINE (L1-L3): BMD (in g/cm2): 0.969 T-score: -1.7 Z-score: -1.9 LEFT FEMORAL NECK: BMD (in g/cm2): 0.817 T-score: -1.6 Z-score: -1.7 LEFT TOTAL HIP: BMD (in g/cm2): 0.813 T-score: -1.5 Z-score: -2.1 RIGHT FEMORAL NECK: BMD (in g/cm2): 0.842 T-score: -1.4 Z-score: -1.5 RIGHT TOTAL HIP: BMD (in g/cm2): 0.853 T-score: -1.2 Z-score: -1.8 FRAX 10-YEAR PROBABILITY OF FRACTURE: 10-year fracture risk is performed using the [employer] FRAX calculator based on patient-reported risk factors. Major osteoporotic fracture: 3.9% Hip fracture: 0.4% Other situations known to alter the reliability of the FRAX score should be considered when making treatment decisions, including chronic glucocorticoid use and past treatments. Further guidance on treatment can be found at the [facility]'s website [email or web address] IMPRESSION:  Osteopenia based on BMD. Fracture risk is increased. Increased risk is based on low BMD. RECOMMENDATIONS: 1. All patients should optimize calcium  and vitamin D  intake. 2. Consider FDA-approved medical therapies in postmenopausal women and men aged 54 years and older, based on the following: - A hip or vertebral (clinical or morphometric) fracture - T-score less than or equal to -2.5 and secondary causes have been excluded. - Low bone mass (T-score between -1.0 and -2.5) and a 10-year probability of a hip fracture greater than or equal to 3% or a 10-year probability of a major osteoporosis-related fracture greater than or equal to 20% based on the [place] -adapted WHO algorithm. - Clinician judgment and/or patient preferences may indicate treatment for people with 10-year fracture probabilities above or below these levels 3. Patients with diagnosis of osteoporosis or at high risk for fracture should have regular bone mineral density tests. For patients eligible for Medicare, routine testing is allowed once every 2 years. The testing frequency can be increased to one year for patients who have rapidly progressing disease, those who are receiving or discontinuing medical therapy to restore bone mass, or have additional risk factors. Electronically Signed   By: Reyes  Hu M.D.   On: 04/22/2024 07:45     Procedures   Medications Ordered in the ED  ondansetron  (ZOFRAN ) injection 4 mg (4 mg Intravenous Given 04/23/24 0353)  HYDROmorphone  (DILAUDID ) injection 0.5 mg (0.5 mg Intravenous Given 04/23/24 0352)  iohexol  (OMNIPAQUE ) 350 MG/ML injection 100 mL (100 mLs Intravenous Contrast Given 04/23/24 0540)  HYDROmorphone  (DILAUDID ) injection 0.5 mg (0.5 mg Intravenous Given 04/23/24 9388)                                    Medical Decision Making Amount and/or Complexity of Data Reviewed Labs: ordered. Radiology: ordered.  Risk Prescription drug management.   Abdominal pain which is worsening.  This is a  presentation with a wide range of treatment options and carries with it a high risk of morbidity and complications.  Differential diagnosis includes, but is not limited to, diverticulitis, pyelonephritis, urolithiasis, cholecystitis, pancreatitis, perforated bowel, vascular insufficiency, appendicitis.  She is reported to have had appendectomy approximately 55 years ago, so appendicitis is very unlikely.  I have reviewed her past records, and note ED visit on 04/08/2024 for left lower quadrant abdominal pain at which time CT scan showed diverticulosis without evidence of diverticulitis.  I have ordered hydromorphone  for pain, ondansetron  for nausea and I ordered CT angiogram of abdomen and pelvis.  I have reviewed her laboratory tests, and my interpretation is urinalysis showing mild glycosuria, normal lipase, borderline elevated creatinine which is unchanged from baseline, elevated random glucose level which will need to be followed as an outpatient, stable anemia, normal WBC.  CT angiogram shows diverticulosis without diverticulitis, incidental findings of cardiomegaly and coronary artery disease, no vascular abnormalities.  Cause for pain is unclear.  However, at this point I feel that the appropriate management is through pain control, no indication for hospital admission.  I am discharging her with a prescription for oxycodone , referring her to gastroenterology and referring her back to her primary care provider.  Return precautions discussed.     Final diagnoses:  Lower abdominal pain  Elevated random blood glucose level  Renal insufficiency  Normochromic normocytic anemia  Chronic anticoagulation    ED Discharge Orders          Ordered    oxyCODONE  (ROXICODONE ) 5 MG immediate release tablet  Every 4 hours PRN        04/23/24 0751               Raford Lenis, MD 04/23/24 [phone]

## 2024-04-23 NOTE — Discharge Instructions (Addendum)
 Your evaluation today did not show why you are having so much pain.  However, there is no sign of any serious pathology.  Take acetaminophen  as needed for pain.  If acetaminophen  is not giving sufficient relief of pain, add oxycodone .  Please make a follow-up appointment with a gastroenterologist.  Return to the emergency department if symptoms are getting worse, or if pain is not being adequately controlled at home.

## 2024-04-29 ENCOUNTER — Encounter: Payer: Self-pay | Admitting: Internal Medicine

## 2024-04-29 ENCOUNTER — Ambulatory Visit: Attending: Internal Medicine | Admitting: Internal Medicine

## 2024-04-29 VITALS — BP 111/70 | HR 70 | Ht 67.5 in | Wt 219.0 lb

## 2024-04-29 DIAGNOSIS — I370 Nonrheumatic pulmonary valve stenosis: Secondary | ICD-10-CM

## 2024-04-29 DIAGNOSIS — I2510 Atherosclerotic heart disease of native coronary artery without angina pectoris: Secondary | ICD-10-CM | POA: Diagnosis not present

## 2024-04-29 DIAGNOSIS — I4819 Other persistent atrial fibrillation: Secondary | ICD-10-CM

## 2024-04-29 NOTE — Progress Notes (Signed)
 Cardiology Office Note:    Date:  04/29/2024   ID:  [name], DOB October 14, 1956, MRN [id number]  PCP:  [name] Velna SAUNDERS, MD  Cardiologist:  Stanly DELENA Leavens, MD   Referring MD: [name] Velna SAUNDERS, MD   CC: Follow up TEE  History of Present Illness:    [name] is a 68 y.o. female with a hx of CAD s/p DES to [facility] and mLAD 07/11/15 (patent stents on repeat cath 11/2016), chronic pericardial effusion, h/o chronic pericarditis with chronic pericardial rub treated intermittently with steroids and colchicine , iritis, PAF on Eliquis  and dofetilide  therapy, chronic diastolic CHF, DM, HTN, remote h/o treated non-small cell lung cancer, pulmonary fibrosis, aortic insufficiency, COPD and anemia.  [name] is a 68 year old female with coronary artery disease and moderate aortic regurgitation who presents for cardiovascular follow-up.  She has a history of coronary artery disease with previous stent placements in the left circumflex and left anterior descending arteries. Her LDL level is under 70 mg/dL. Her medication regimen includes Eliquis  and dofetilide  for paroxysmal atrial fibrillation, and she has not experienced significant symptoms recently. She carries nitroglycerin  but has not needed to use it.  She has moderate aortic regurgitation, confirmed by echocardiogram. She experiences shortness of breath with exertion, such as climbing stairs or walking long distances, but no sudden changes in her condition. She has a history of pericarditis treated with prednisone  and colchicine , with no current symptoms of recurrence.  Her medical history includes chronic heart failure, diabetes, hypertension, pulmonary fibrosis, COPD, and microcytic anemia. She has hypertension, which is managed by her primary care doctor. She has mild pulmonic stenosis, potentially related to past radiation exposure, but recent echocardiograms did not show significant progression.  She reports issues with swallowing,  which have delayed some diagnostic procedures. She is scheduled to see her gastroenterologist soon to address these concerns. She also mentions experiencing fatigue and increased heart rate during activities like making the bed or showering, which she manages by resting.  Her medication regimen includes Eliquis , dofetilide , metoprolol , Lasix , spironolactone , and cholesterol-lowering medication.  Discussed the use of AI scribe software for clinical note transcription with the patient, who gave verbal consent to proceed. She has elected to record this conversation as well (iPhone).   Past Medical History:  Diagnosis Date   Allergy    Anemia    Aortic insufficiency    a. Mod by echo 05/2015.   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 Abnormal stress test -> LHc 06/2015 s/p DES to mCX and mLAD, residual D2 disease treated medially.   Diverticulitis    Erythema nodosum    Heart murmur    History of kidney stones    Hypertension    Insomnia    lung ca dx'd 10/1999   chemo/xrt comp 05/29/2000   Myalgia    Obesity    Pericardial effusion    a. 05/2015 Echo: EF 55-60%, no rwma, Gr 1 DD, no effusion - but an effusion was seen on CT which was felt to be increased in size compared to 12/2014 - pericardium also thickened.    Pericarditis    a. Dx 05/2015.   Pneumothorax, spontaneous, tension 01/20/2012   October, 2010  PONV (postoperative nausea and vomiting)    Pulmonary fibrosis (HCC) 01/20/2012   Due to previous surgery and chest radiation for lung cancer   Type II diabetes mellitus [facility])     Past Surgical History:  Procedure Laterality Date   ABDOMINAL HYSTERECTOMY  10/21/2006   APPENDECTOMY  1969?   ATRIAL FIBRILLATION ABLATION N/A 02/27/2022   Procedure: ATRIAL  FIBRILLATION ABLATION;  Surgeon: Inocencio Soyla Lunger, MD;  Location: [facility] INVASIVE CV LAB;  Service: Cardiovascular;  Laterality: N/A;   BRONCHIAL WASHINGS  08/03/2021   Procedure: BRONCHIAL WASHINGS;  Surgeon: Jude Harden GAILS, MD;  Location: [facility] ENDOSCOPY;  Service: Cardiopulmonary;;   CARDIAC CATHETERIZATION N/A 07/11/2015   Procedure: Left Heart Cath and Coronary Angiography;  Surgeon: Candyce GORMAN Reek, MD;  Location: [facility] INVASIVE CV LAB;  Service: Cardiovascular;  Laterality: N/A;   CARDIAC CATHETERIZATION  07/11/2015   Procedure: Coronary Stent Intervention;  Surgeon: Candyce GORMAN Reek, MD;  Location: [facility] INVASIVE CV LAB;  Service: Cardiovascular;;   CARPAL TUNNEL RELEASE Bilateral 1998-2005?   right-left   CARPAL TUNNEL RELEASE Left 2009?   for the 2nd time   CHOLECYSTECTOMY N/A 10/01/2022   Procedure: LAPAROSCOPIC CHOLECYSTECTOMY;  Surgeon: Signe Mitzie LABOR, MD;  Location: [facility] ORS;  Service: General;  Laterality: N/A;   COLONOSCOPY     COLONOSCOPY WITH PROPOFOL  N/A 05/28/2018   Procedure: COLONOSCOPY WITH PROPOFOL ;  Surgeon: Teressa Toribio SQUIBB, MD;  Location: [facility] ENDOSCOPY;  Service: Endoscopy;  Laterality: N/A;   CORONARY ANGIOPLASTY     CYSTOSCOPY W/ STONE MANIPULATION  10/22/2011   ESOPHAGOGASTRODUODENOSCOPY (EGD) WITH ESOPHAGEAL DILATION  10/21/2010   FOOT SURGERY Bilateral 06/22/1979   Callous removed    INNER EAR SURGERY Right ~ 2009   KNEE ARTHROPLASTY Right 10/21/1989   KNEE ARTHROSCOPY  ~ 2000   LATERAL RELEASE   LEFT HEART CATH AND CORONARY ANGIOGRAPHY N/A 12/18/2016   Procedure: Left Heart Cath and Coronary Angiography;  Surgeon: Lonni JONETTA Cash, MD;  Location: [facility] INVASIVE CV LAB;  Service: Cardiovascular;  Laterality: N/A;   LEFT HEART CATH AND CORONARY ANGIOGRAPHY N/A 02/01/2021   Procedure: LEFT HEART CATH AND CORONARY ANGIOGRAPHY;  Surgeon: Anner Alm ORN, MD;  Location: [facility] INVASIVE CV LAB;  Service: Cardiovascular;  Laterality: N/A;   LUNG CANCER SURGERY  Left 10/22/1999   small cell   LUNG SURGERY Right 10/21/2005   reinflated it   PULMONARY EMBOLISM SURGERY  10/21/2005   LUNG COLLAPSE   RIGHT AND LEFT HEART CATH N/A 06/25/2017   Procedure: RIGHT AND LEFT HEART CATH;  Surgeon: Wonda Sharper, MD;  Location: [facility] INVASIVE CV LAB;  Service: Cardiovascular;  Laterality: N/A;   SHOULDER ADHESION RELEASE Right ~ 2004   SHOULDER ARTHROSCOPY W/ ROTATOR CUFF REPAIR Right ~ 2003   TOTAL KNEE ARTHROPLASTY Left 04/14/2023   Procedure: TOTAL KNEE ARTHROPLASTY;  Surgeon: Yvone Rush, MD;  Location: [facility] ORS;  Service: Orthopedics;  Laterality: Left;   TRANSESOPHAGEAL ECHOCARDIOGRAM (CATH LAB) N/A 04/01/2024   Procedure: TRANSESOPHAGEAL ECHOCARDIOGRAM;  Surgeon: Santo Stanly LABOR, MD;  Location: [facility] INVASIVE CV LAB;  Service: Cardiovascular;  Laterality: N/A;   UPPER GASTROINTESTINAL ENDOSCOPY  06/05/2021   VIDEO BRONCHOSCOPY N/A 08/03/2021   Procedure: VIDEO BRONCHOSCOPY WITHOUT FLUORO;  Surgeon: Jude Harden GAILS, MD;  Location: [facility] ENDOSCOPY;  Service: Cardiopulmonary;  Laterality: N/A;    Current Medications: Current Meds  Medication Sig   ACCU-CHEK GUIDE test strip    Accu-Chek Softclix Lancets lancets USE LANCETS 3 TIMES A DAY AS DIRECTED 30 DAYS  acetaminophen  (TYLENOL ) 650 MG CR tablet Take 1,300 mg by mouth every 8 (eight) hours as needed for pain (headache).   B-D UF III MINI PEN NEEDLES 31G X 5 MM MISC 2 (two) times daily. as directed   Blood Glucose Monitoring Suppl (ACCU-CHEK GUIDE) w/Device KIT See admin instructions.   co-enzyme Q-10 50 MG capsule TAKE 1 CAPSULE BY MOUTH EVERY DAY   dicyclomine  (BENTYL ) 20 MG tablet Take 1 tablet (20 mg total) by mouth 2 (two) times daily.   diltiazem  (CARDIZEM  CD) 360 MG 24 hr capsule Take 1 capsule (360 mg total) by mouth daily.   ELIQUIS  5 MG TABS tablet TAKE 1 TABLET BY MOUTH TWICE A DAY   furosemide  (LASIX ) 40 MG tablet Take 1 tablet (40 mg total) by mouth daily as needed for fluid or edema.    levothyroxine (SYNTHROID, LEVOTHROID) 50 MCG tablet Take 50 mcg by mouth daily before breakfast.    metoprolol  succinate (TOPROL -XL) 50 MG 24 hr tablet TAKE 1.5 TABLETS (75 MG TOTAL) BY MOUTH AT BEDTIME. TAKE WITH OR IMMEDIATELY FOLLOWING A MEAL.   nitroGLYCERIN  (NITROSTAT ) 0.4 MG SL tablet Place 0.4 mg under the tongue every 5 (five) minutes as needed for chest pain.   NOVOLOG  MIX 70/30 FLEXPEN (70-30) 100 UNIT/ML FlexPen Inject 15-20 Units into the skin daily with breakfast. Take 15 units in the morning and 20 units in the eveing   rosuvastatin  (CRESTOR ) 20 MG tablet Take 20 mg by mouth daily.   spironolactone  (ALDACTONE ) 25 MG tablet TAKE 1 TABLET (25 MG TOTAL) BY MOUTH DAILY.   tirzepatide (MOUNJARO) 7.5 MG/0.5ML Pen Inject 7.5 mg into the skin once a week.   tiZANidine  (ZANAFLEX ) 4 MG tablet Take 1 tablet (4 mg total) by mouth every 6 (six) hours as needed for muscle spasms.   traMADol  (ULTRAM ) 50 MG tablet Take 50 mg by mouth every 6 (six) hours as needed for moderate pain (pain score 4-6).     Allergies:   Hydrocodone , Morphine  and codeine , Canagliflozin, Metformin hcl, Oxycodone , Rosuvastatin , Lisinopril , and Nsaids   Social History   Socioeconomic History   Marital status: Single    Spouse name: Not on file   Number of children: 0   Years of education: Not on file   Highest education level: Not on file  Occupational History   Occupation: RETIRED  Tobacco Use   Smoking status: Former    Current packs/day: 0.00    Average packs/day: 0.3 packs/day for 5.0 years (1.3 ttl pk-yrs)    Types: Cigarettes    Start date: 07/21/1994    Quit date: 07/22/1999    Years since quitting: 24.7   Smokeless tobacco: Never   Tobacco comments:    Former smoker 11/28/22  Vaping Use   Vaping status: Never Used  Substance and Sexual Activity   Alcohol use: No    Alcohol/week: 0.0 standard drinks of alcohol   Drug use: No   Sexual activity: Yes    Birth control/protection: Other-see comments     Comment: hysterectomy  Other Topics Concern   Not on file  Social History Narrative   Not on file   Social Drivers of Health   Financial Resource Strain: Low Risk  (07/12/2021)   Overall Financial Resource Strain (CARDIA)    Difficulty of Paying Living Expenses: Not hard at all  Food Insecurity: No Food Insecurity (04/14/2023)   Hunger Vital Sign    Worried About Running Out of Food in the Last Year: Never true  Ran Out of Food in the Last Year: Never true  Transportation Needs: No Transportation Needs (04/14/2023)   PRAPARE - Administrator, Civil Service (Medical): No    Lack of Transportation (Non-Medical): No  Physical Activity: Inactive (07/12/2021)   Exercise Vital Sign    Days of Exercise per Week: 0 days    Minutes of Exercise per Session: 0 min  Stress: No Stress Concern Present (07/12/2021)   [employer] of Occupational Health - Occupational Stress Questionnaire    Feeling of Stress : Not at all  Social Connections: Not on file     Family History: The patient's family history includes Diabetes in her father, maternal grandmother, mother, paternal grandmother, sister, sister, sister, and sister; Heart disease in her father; Hypertension in her sister; Kidney disease in her sister; Pancreatic cancer in her mother. There is no history of Colon cancer, Colon polyps, Rectal cancer, Stomach cancer, Esophageal cancer, or Breast cancer.  ROS:   Please see the history of present illness.     EKGs/Labs/Other Studies Reviewed:    The following studies were reviewed today:   Cardiac Studies & Procedures   ______________________________________________________________________________________________ CARDIAC CATHETERIZATION  CARDIAC CATHETERIZATION 02/01/2021  Conclusion  The left ventricular systolic function is normal. The left ventricular ejection fraction is 55-65% by visual estimate.  LV end diastolic pressure is normal.  Prox LAD lesion is 30%  stenosed just prior to long stent  Prox LAD to Mid LAD long DES stent (38mm) is 5% stenosed. The jailed 1st Diag lesion is 50% stenosed.  Prox Cx to Mid Cx DES Stent is 20% stenosed.  1st Mrg lesion is 20% stenosed.  SUMMARY  Left Dominant System with Two-Vessel Disease:  Proximal LAD focal 20 to 30% calcified nodule just prior to a long stent that is widely patent.  The jailed diagonal branch has 60% stenosis.  LAD is wraparound perfuses the distal third of the PDA territory  Large dominant LCx with 1 major OM 1, smaller OM 2 before going to the AV groove to give a PL 1 and PDA.  Just after OM1 there is a widely patent stent  Preserved LVEF with normal EDP.    RECOMMENDATIONS  Consider nonanginal chest pain -> potentially could have residual findings of pericarditis.  Discharge after bedrest    Alm Clay, MD  Findings Coronary Findings Diagnostic  Dominance: Left  Left Main Vessel is large.  Left Anterior Descending Prox LAD lesion is 30% stenosed. The lesion is discrete and eccentric. The lesion is moderately calcified. The lesion was previously treated. Prox LAD to Mid LAD lesion is 5% stenosed. The lesion was previously treated using a drug eluting stent . Previously placed stent displays restenosis.  First Diagonal Branch 1st Diag lesion is 50% stenosed. The lesion is concentric.  Second Septal Branch Vessel is small in size.  Third Diagonal Branch Vessel is small in size.  Ramus Intermedius Vessel is small.  Left Circumflex Vessel is large. Prox Cx to Mid Cx lesion is 20% stenosed. The lesion is located at the bend. The lesion was previously treated using a drug eluting stent over 2 years ago. Previously placed stent displays restenosis.  First Obtuse Marginal Branch Vessel is large in size. 1st Mrg lesion is 20% stenosed. The lesion is segmental and eccentric.  Second Obtuse Marginal Branch Vessel is moderate in size.  Second Left  Posterolateral Branch Vessel is small in size.  Right Coronary Artery Vessel is small.  Acute Marginal Branch Vessel  is small in size.  Right Ventricular Branch Vessel is small in size.  Intervention  No interventions have been documented.   CARDIAC CATHETERIZATION  CARDIAC CATHETERIZATION 06/25/2017  Conclusion 1. Elevated left and right heart diastolic filling pressures 2. Preserved cardiac output 3. No evidence of ventricular interdependence based on simultaneous LV/RV pressure recording  Summary: Probable biventricular diastolic dysfunction. No clear evidence of the hemodynamic sequelae of pericardial constriction   STRESS TESTS  MYOCARDIAL PERFUSION IMAGING 02/20/2022  Interpretation Summary   The study is normal. The study is low risk.   No ST deviation was noted.   Left ventricular function is normal. Nuclear stress EF: 63 %. The left ventricular ejection fraction is normal (55-65%). End diastolic cavity size is normal.   Prior study available for comparison from 09/27/2016.   ECHOCARDIOGRAM  ECHOCARDIOGRAM COMPLETE 01/26/2024  Narrative ECHOCARDIOGRAM REPORT    Patient Name:   BRETTA FEES Date of Exam: 01/26/2024 Medical Rec #:  [id number]     Height:       67.0 in Accession #:    [id number]    Weight:       219.4 lb Date of Birth:  June 17, 1956     BSA:          2.103 m Patient Age:    67 years      BP:           124/68 mmHg Patient Gender: F             HR:           76 bpm. Exam Location:  [address]  Procedure: 2D Echo, 3D Echo, Cardiac Doppler and Color Doppler (Both Spectral and Color Flow Doppler were utilized during procedure).  Indications:    I50.32 CHF  History:        Patient has prior history of Echocardiogram examinations, most recent 02/05/2023. CHF, CAD, Arrythmias:Atrial Fibrillation, Signs/Symptoms:Murmur and Dizziness/Lightheadedness; Risk Factors:Hypertension, Diabetes, Dyslipidemia, Family History of Coronary Artery Disease and  Former Smoker. History of Pericardial Effusion, Pulmonary Fibrosis, History of Lung Cancer with Chemotherapy and Radiation (2001).  Sonographer:    Heather Hawks RDCS Referring Phys: [facility] [place]  IMPRESSIONS   1. The aortic valve is tricuspid. Aortic valve regurgitation is severe. Holodiastolic backflow seen in proximal descending aorta 2. Left ventricular ejection fraction, by estimation, is 65 to 70%. The left ventricle has normal function. The left ventricle has no regional wall motion abnormalities. Left ventricular diastolic parameters were normal. 3. Right ventricular systolic function is normal. The right ventricular size is normal.  FINDINGS Left Ventricle: Left ventricular ejection fraction, by estimation, is 65 to 70%. The left ventricle has normal function. The left ventricle has no regional wall motion abnormalities. 3D ejection fraction reviewed and evaluated as part of the interpretation. Alternate measurement of EF is felt to be most reflective of LV function. The left ventricular internal cavity size was normal in size. There is no left ventricular hypertrophy. Left ventricular diastolic parameters were normal.  Right Ventricle: The right ventricular size is normal. Right vetricular wall thickness was not assessed. Right ventricular systolic function is normal.  Left Atrium: Left atrial size was normal in size.  Right Atrium: Right atrial size was normal in size.  Pericardium: Trivial pericardial effusion is present.  Mitral Valve: The mitral valve is normal in structure. Trivial mitral valve regurgitation.  Tricuspid Valve: The tricuspid valve is normal in structure. Tricuspid valve regurgitation is mild.  Aortic Valve: Holodiastolic backflow present in  proximal descending aorta. The aortic valve is tricuspid. Aortic valve regurgitation is severe. Aortic regurgitation PHT measures 307 msec.  Pulmonic Valve: The pulmonic valve was grossly normal. Pulmonic  valve regurgitation is mild.  Aorta: The aortic root and ascending aorta are structurally normal, with no evidence of dilitation.  Venous: The inferior vena cava is normal in size with greater than 50% respiratory variability, suggesting right atrial pressure of 3 mmHg.  IAS/Shunts: No atrial level shunt detected by color flow Doppler.   LEFT VENTRICLE PLAX 2D LVIDd:         4.70 cm   Diastology LVIDs:         2.40 cm   LV e' medial:    8.81 cm/s LV PW:         0.70 cm   LV E/e' medial:  6.9 LV IVS:        0.80 cm   LV e' lateral:   9.01 cm/s LVOT diam:     2.20 cm   LV E/e' lateral: 6.7 LV SV:         74 LV SV Index:   35 LVOT Area:     3.80 cm  3D Volume EF: 3D EF:        52 % LV EDV:       120 ml LV ESV:       57 ml LV SV:        63 ml  RIGHT VENTRICLE RV Basal diam:  3.80 cm RV S prime:     13.10 cm/s TAPSE (M-mode): 2.4 cm RVSP:           19.5 mmHg  LEFT ATRIUM             Index        RIGHT ATRIUM           Index LA diam:        3.50 cm 1.66 cm/m   RA Pressure: 3.00 mmHg LA Vol (A2C):   45.8 ml 21.77 ml/m  RA Area:     18.40 cm LA Vol (A4C):   53.3 ml 25.34 ml/m  RA Volume:   50.60 ml  24.06 ml/m LA Biplane Vol: 49.9 ml 23.72 ml/m AORTIC VALVE LVOT Vmax:   102.75 cm/s LVOT Vmean:  67.600 cm/s LVOT VTI:    0.195 m AI PHT:      307 msec  AORTA Ao Root diam: 2.70 cm Ao Asc diam:  3.70 cm  MITRAL VALVE               TRICUSPID VALVE MV Area (PHT): cm         TR Peak grad:   16.5 mmHg MV Decel Time: 217 msec    TR Vmax:        203.00 cm/s MV E velocity: 60.55 cm/s  Estimated RAP:  3.00 mmHg MV A velocity: 54.40 cm/s  RVSP:           19.5 mmHg MV E/A ratio:  1.11 SHUNTS Systemic VTI:  0.20 m Systemic Diam: 2.20 cm  Vina Gull MD Electronically signed by Vina Gull MD Signature Date/Time: 01/28/2024/10:49:55 AM    Final   TEE  ECHO TEE 04/01/2024  Narrative TRANSESOPHOGEAL ECHO REPORT    Patient Name:   [name] Date of Exam:  04/01/2024 Medical Rec #:  [id number]     Height:       68.0 in Accession #:    [id number]    Weight:  217.0 lb Date of Birth:  02-26-1956     BSA:          2.116 m Patient Age:    67 years      BP:           155/57 mmHg Patient Gender: F             HR:           83 bpm. Exam Location:  Inpatient  Procedure: 3D Echo, Transesophageal Echo, Color Doppler and Cardiac Doppler (Both Spectral and Color Flow Doppler were utilized during procedure).  Indications:    Aortic Insufficiencey  History:        Patient has prior history of Echocardiogram examinations, most recent 01/26/2024.  Sonographer:    Tinnie Gosling RDCS Referring Phys: [id number] [facility] [place]  PROCEDURE: The transesophogeal probe was passed without difficulty through the esophogus of the patient. Sedation performed by different physician. The patient developed no complications during the procedure.  IMPRESSIONS   1. Left ventricular ejection fraction, by estimation, is 60 to 65%. The left ventricle has normal function. 2. Right ventricular systolic function is normal. The right ventricular size is normal. 3. No left atrial/left atrial appendage thrombus was detected. The LAA emptying velocity was 15 cm/s. 4. A small pericardial effusion is present. 5. The mitral valve is grossly normal. Mild mitral valve regurgitation. No evidence of mitral stenosis. 6. The aortic valve is tricuspid. Aortic valve regurgitation is moderate. No aortic stenosis is present. 7. 3D performed of the aortic valve and demonstrates Moderate aortic regurgitation with normal left ventricular size and function. There is non-holodiastolic flow reversal in the ascending aorta; the peak velocity is less than 20 cm/2 at peak R wave. 3D VCA 0.22 cm2, 2D VC 4 mm.  FINDINGS Left Ventricle: Left ventricular ejection fraction, by estimation, is 60 to 65%. The left ventricle has normal function. The left ventricular internal cavity size was normal in  size.  Right Ventricle: The right ventricular size is normal. No increase in right ventricular wall thickness. Right ventricular systolic function is normal.  Left Atrium: Left atrial size was normal in size. No left atrial/left atrial appendage thrombus was detected. The LAA emptying velocity was 15 cm/s.  Right Atrium: Right atrial size was normal in size.  Pericardium: A small pericardial effusion is present.  Mitral Valve: The mitral valve is grossly normal. Mild mitral valve regurgitation. No evidence of mitral valve stenosis.  Tricuspid Valve: The tricuspid valve is normal in structure. Tricuspid valve regurgitation is mild . No evidence of tricuspid stenosis.  Aortic Valve: The aortic valve is tricuspid. Aortic valve regurgitation is moderate. No aortic stenosis is present.  Pulmonic Valve: The pulmonic valve was normal in structure. Pulmonic valve regurgitation is not visualized. No evidence of pulmonic stenosis.  Aorta: The aortic root, ascending aorta and aortic arch are all structurally normal, with no evidence of dilitation or obstruction.  IAS/Shunts: The atrial septum is grossly normal.  Additional Comments: 3D was performed not requiring image post processing on an independent workstation and was indeterminate.  Stanly Leavens MD Electronically signed by Stanly Leavens MD Signature Date/Time: 04/01/2024/3:52:59 PM    Final  MONITORS  LONG TERM MONITOR (3-14 DAYS) 08/05/2019  Narrative Minimum: 53 bpm (01:01:28 Day 3) Maximum: 135 bpm (00:46:54 Day 9) Average: 80 bpm Predominant rhythm sinus rhythm Atrial fibrillation <1%  Will Camnitz, MD     CARDIAC MRI  MR CARDIAC MORPHOLOGY W WO CONTRAST 04/14/2017  Narrative CLINICAL  DATA:  Pericarditis  EXAM: CARDIAC MRI  TECHNIQUE: The patient was scanned on a 1.5 Tesla GE magnet. A dedicated cardiac coil was used. Functional imaging was done using Fiesta sequences. 2,3, and 4 chamber views  were done to assess for RWMA's. Modified Simpson's rule using a short axis stack was used to calculate an ejection fraction on a dedicated work [employer]. The patient received Multihance . After 10 minutes inversion recovery sequences were used to assess for infiltration and scar tissue.  CONTRAST:  Multihance   FINDINGS: There was mild LAE. The RA and RV were normal. There was no abnormal septal bounce or evidence of ventricular inter-dependence although free breathing sequences were not performed The aortic valve was tri leaflet with some degree of AR that was not well characterized on this study. Mitral and tricuspid valves normal. The patient had a large amount of both visceral and parietal epicardial adipose tissue. There was a moderate pericardial effusion posterior to the LV and lateral to the RV free wall This was not appreciated on her recent echo due to poor acoustic windows and lateral drop out. The pericardium itself was thin and there were no signs of diastolic RV collapse. Post gadolinium images showed mild pericardial uptake over the RV free wall and apex on 3 chamber views. There was no evidence of myocarditis or LV myocardial uptake.  IMPRESSION: 1) Moderate pericardial effusion lateral to the RV free wall and posterior LV myocardium. Note this is not appreciated on recent echo  2) Normal pericardial thickness with mild gadolinium uptake over the RV free wall and apex on 3 chamber view  3) No evidence of constriction although MRI especially with no free breathing sequences not ideal to assess Pericardium normal thickness and no ventricular inter dependence  4) Normal LV EF 59% (EDV 105 ESV 43 SV 62 cc)  5) No evidence of myocarditis or LV myocardial uptake of gadolinium on delayed inversion recovery sequences  6) Tri leaflet aortic valve with AR  7) Mild LAE  Images were also reviewed by Dr Ezra Shuck who agreed with  above findings  Maude Emmer   Electronically Signed By: Maude Emmer M.D. On: 04/14/2017 15:12   ______________________________________________________________________________________________       Recent Labs: 04/22/2024: ALT 12; BUN 19; Creatinine, Ser 1.07; Hemoglobin 10.7; Platelets 274; Potassium 3.5; Sodium 139  Recent Lipid Panel    Component Value Date/Time   CHOL 127 11/27/2021 0408   CHOL 130 04/25/2021 1614   TRIG 72 11/27/2021 0408   HDL 44 11/27/2021 0408   HDL 44 04/25/2021 1614   CHOLHDL 2.9 11/27/2021 0408   VLDL 14 11/27/2021 0408   LDLCALC 69 11/27/2021 0408   LDLCALC 67 04/25/2021 1614    Physical Exam:    VS:  BP 111/70 (BP Location: Left Arm)   Pulse 70   Ht 5' 7.5 (1.715 m)   Wt 219 lb (99.3 kg)   SpO2 96%   BMI 33.79 kg/m     Wt Readings from Last 3 Encounters:  04/29/24 219 lb (99.3 kg)  04/22/24 216 lb 0.8 oz (98 kg)  04/08/24 216 lb (98 kg)   GEN: No acute distress.  HEENT: Normal CARDIAC: Holosystolic murmur predominantes. RRR no gallop, or edema. VASCULAR:  Normal Pulses. RESPIRATORY:  Clear to auscultation without rales, wheezing or rhonchi  ABDOMEN: Soft, non-tender, non-distended MUSCULOSKELETAL: No deformity  SKIN: Warm and dry NEUROLOGIC:  Alert and oriented x 3 PSYCHIATRIC:  Normal affect   ASSESSMENT:  1. Nonrheumatic pulmonary valve stenosis   2. Persistent atrial fibrillation (HCC)   3. Coronary artery disease involving native coronary artery of native heart without angina pectoris      PLAN:    Moderate aortic regurgitation Moderate aortic regurgitation confirmed by echocardiogram. Asymptomatic with no indication for valve replacement. Close to severe but not yet requiring intervention. - Repeat MRI in one year to monitor aortic regurgitation - Discussed natural history and potential future need for valve replacement if progression occurs  Chronic heart failure NYHA II rare [place] Chronic heart  failure with no significant fatigue or dyspnea at rest, but exertional dyspnea present. Current medications include diuretics and beta-blockers to manage symptoms and prevent fluid overload. - Continue Lasix  and metoprolol  and MRA - Monitor symptoms and adjust medications as needed - Discussed potential for medication adjustment if symptoms worsen  Persistent atrial fibrillation Prior procedural intervention; atrial fibrillation, currently asymptomatic in sinus rhythm with no recent episodes. On Eliquis  for stroke prevention and dofetilide  for rhythm control. - Continue Eliquis  - Discussed importance of continuing anticoagulation to reduce stroke risk  Hypertension with diabetes Hypertension and diabetes managed by primary care physician. Blood pressure is well-controlled.  Pulmonary fibrosis History of lung cancer Chronic obstructive pulmonary disease (COPD) Pulmonary fibrosis managed by non-cardiac team.  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 Currently asymptomatic with no significant pericardial effusion. - Consider Quantiferon Gold test and Rilonacept if symptoms of pericarditis recur - Monitoring for recurrence of symptoms - Discussed potential use of Rilonacept if symptoms recur - if CMR is abnormal, discussed genetic arrhythmia testing  Longitudinal care: The evaluation and management services provided today reflect the complexity inherent in caring for this patient, including the ongoing longitudinal relationship and management of multiple chronic conditions and/or the need for care coordination. The visit required a comprehensive assessment and management plan tailored to the patient's unique needs Time was spent addressing not only the acute concerns but also the broader context of the patient's health, including preventive care, chronic disease  management, and care coordination as appropriate.  Complex longitudinal is necessary for conditions including: Complex valve management with support of her now asymptomatic pericardial effusion   One year f/u   Stanly Leavens, MD FASE [facility] Cardiologist [facility]  [address] [place], [place] [address] [phone]  10:52 AM

## 2024-04-29 NOTE — Patient Instructions (Signed)
 Medication Instructions:  Your physician recommends that you continue on your current medications as directed. Please refer to the Current Medication list given to you today.  *If you need a refill on your cardiac medications before your next appointment, please call your pharmacy*  Lab Work: NONE  If you have labs (blood work) drawn today and your tests are completely normal, you will receive your results only by: MyChart Message (if you have MyChart) OR A paper copy in the mail If you have any lab test that is abnormal or we need to change your treatment, we will call you to review the results.  Testing/Procedures: [name] 20206- - -Your physician has requested that you have a cardiac MRI. Cardiac MRI uses a computer to create images of your heart as its beating, producing both still and moving pictures of your heart and major blood vessels. For further information please visit [email or web address]. Please follow the instruction sheet given to you today for more information.   Follow-Up: At [facility], you and your health needs are our priority.  As part of our continuing mission to provide you with exceptional heart care, our providers are all part of one team.  This team includes your primary Cardiologist (physician) and Advanced Practice Providers or APPs (Physician Assistants and Nurse Practitioners) who all work together to provide you with the care you need, when you need it.  Your next appointment:   1 year(s)  Provider:   Stanly DELENA Leavens, MD or One of our Advanced Practice Providers (APPs): Morse Clause, PA-C  Lamarr Satterfield, NP Miriam Shams, NP  Olivia Pavy, PA-C Josefa Beauvais, NP  Leontine Salen, PA-C Orren Fabry, PA-C  Hao Meng, PA-C Ernest Dick, NP  Damien Braver, NP Jon Hails, PA-C  Waddell Donath, PA-C    Dayna Dunn, PA-C  Scott Weaver, PA-C Lum Louis, NP Katlyn West, NP Callie Goodrich, PA-C  Evan Williams, PA-C Sheng Haley, PA-C  Xika  Zhao, NP Kathleen Johnson, PA-C      Other Instructions   You are scheduled for Cardiac MRI at the location below.  Please arrive for your appointment at ______________ . ?  [facility] [address] [place], [place] [address] Please take advantage of the free valet parking available at the [facility] and [employer] (Entrance C).  Proceed to the [facility] Radiology Department (First Floor) for check-in.   OR   [facility] [address] [place], [place] [address] Please go to the [facility] and check-in with the desk attendant.   Magnetic resonance imaging (MRI) is a painless test that produces images of the inside of the body without using Xrays.  During an MRI, strong magnets and radio waves work together in a Data processing manager to form detailed images.   MRI images may provide more details about a medical condition than X-rays, CT scans, and ultrasounds can provide.  You may be given earphones to listen for instructions.  You may eat a light breakfast and take medications as ordered with the exception of furosemide , hydrochlorothiazide , chlorthalidone or spironolactone  (or any other fluid pill). If you are undergoing a stress MRI, please avoid stimulants for 12 hr prior to test. (I.e. Caffeine , nicotine, chocolate, or antihistamine medications)  If your provider has ordered anti-anxiety medications for this test, then you will need a driver.  An IV will be inserted into one of your veins. Contrast material will be injected into your IV. It will leave your body through your  urine within a day. You may be told to drink plenty of fluids to help flush the contrast material out of your system.  You will be asked to remove all metal, including: Watch, jewelry, and other metal objects including hearing aids, hair pieces and dentures. Also wearable glucose monitoring systems (ie. Freestyle Libre and Omnipods) (Braces and fillings normally  are not a problem.)   TEST WILL TAKE APPROXIMATELY 1 HOUR  PLEASE NOTIFY SCHEDULING AT LEAST 24 HOURS IN ADVANCE IF YOU ARE UNABLE TO KEEP YOUR APPOINTMENT. [phone]  For more information and frequently asked questions, please visit our website : [email or web address]  Please call the Cardiac Imaging Nurse Navigators with any questions/concerns. [phone] Office

## 2024-05-11 DIAGNOSIS — H35033 Hypertensive retinopathy, bilateral: Secondary | ICD-10-CM | POA: Diagnosis not present

## 2024-05-11 DIAGNOSIS — N179 Acute kidney failure, unspecified: Secondary | ICD-10-CM | POA: Diagnosis not present

## 2024-05-11 DIAGNOSIS — H15002 Unspecified scleritis, left eye: Secondary | ICD-10-CM | POA: Diagnosis not present

## 2024-05-11 DIAGNOSIS — E113293 Type 2 diabetes mellitus with mild nonproliferative diabetic retinopathy without macular edema, bilateral: Secondary | ICD-10-CM | POA: Diagnosis not present

## 2024-05-11 DIAGNOSIS — R1032 Left lower quadrant pain: Secondary | ICD-10-CM | POA: Diagnosis not present

## 2024-05-11 DIAGNOSIS — H2513 Age-related nuclear cataract, bilateral: Secondary | ICD-10-CM | POA: Diagnosis not present

## 2024-05-11 DIAGNOSIS — D649 Anemia, unspecified: Secondary | ICD-10-CM | POA: Diagnosis not present

## 2024-05-11 DIAGNOSIS — H15102 Unspecified episcleritis, left eye: Secondary | ICD-10-CM | POA: Diagnosis not present

## 2024-05-11 DIAGNOSIS — Z79899 Other long term (current) drug therapy: Secondary | ICD-10-CM | POA: Diagnosis not present

## 2024-05-11 DIAGNOSIS — E1165 Type 2 diabetes mellitus with hyperglycemia: Secondary | ICD-10-CM | POA: Diagnosis not present

## 2024-05-11 NOTE — Progress Notes (Unsigned)
 05/12/2024 [name] [id number] [birth year]  Referring provider: Vernon Velna SAUNDERS, MD Primary GI doctor: Dr. Federico (Dr. Teressa)  ASSESSMENT AND PLAN:  Lower AB pain x 1 year, worse since March 2019 showed pandiverticulosis otherwise negative Recall 2029 ER visit 04/08/2024 and 04/22/2024  04/08/2024 CTAPW tics but no inflammation 04/23/2024 CT angio unremarkable Chronic abdominal pain with cramping, exacerbated by eating, possibly due to gastroparesis from Mounjaro. Positive Carnett's sign suggests abdominal wall pain. Differential includes muscular pain, referred pain from hips, and gastroparesis. - Order abdominal x-ray to rule out obstruction. - Order hip x-rays to evaluate for referred pain. - Prescribe lidocaine  pain patches for abdominal pain. - Initiate gastroparesis diet. - Prescribe new medication for upper abdominal pain, twice daily pantoprazole . - Follow up in 4-6 weeks. - consider Cymbalta or gabapentin   Dysphagia  S/p cholecystectomy  Index of symptoms: hoarseness, feeling of upper esophageal/orophangeal dysphagia with liquids, pills and solids for a year, denies GERD, no weight loss.  05/2021 EGD for dysphagia benign proximal esophageal stricture 22 cm dilated and a few small AVMs in the esophagus unchanged polypoid lesion stomach unchanged over 13 years 02/09/24 barium swallow showed dysmotility with retrograde propulsion of barium bolus into upper esophagus, areas of mild extrinsic compression at the level of aortic notch as well as posterior stomach  that corresponds with poor progression of barium bolus CT chest normal aorta 07/2023 Possibly to [facility] contributing to symptoms versus dysphagia aortic. States well controlled at this time  AI severe 04/01/2024 echo TEE without any issues passing scope showed EF 60 to 65% normal RVSP F no thrombus small pericardial effusion mild MR aortic valve regurgitation moderate no aortic stenosis none holodiastolic flow  reversal ascending aorta Has some DOE, leg swelling improves at night, no chest pain Pending MR cardiac morphology  CAD status post stents Follows with Dr. Victory Sharps Pending LHC/RHC  Atrial fibrillation On Eliquis   Congestive heart failure with preserved EF 01/26/24 echo EF 65-70%, severe aortic valve regurg with holodiastolic backflow in proximal descending aorta  Type 2 diabetes insulin -dependent On Mounjaro x last year Discussed GLP1 with the patient, mechanism of action. Gastroparesis diet given to the patient.  Patient should be instructed to hold this medications if dose falls within 7 days of endoscopic procedure, due to increased risk of retained gastric contents.  Screening colonoscopy 2019 showed pandiverticulosis otherwise negative Recall 2029  Patient Care Team: Vernon Velna SAUNDERS, MD as PCP - General (Internal Medicine) Inocencio Soyla Lunger, MD as PCP - Electrophysiology (Cardiology) Santo Stanly LABOR, MD as PCP - Cardiology (Cardiology) Carlie Clark, MD Teressa Toribio SQUIBB, MD (Inactive) (Gastroenterology) Yvone Rush, MD (Orthopedic Surgery) Freddie Lynwood CHRISTELLA, MD (Hematology and Oncology) Brantley Nancyann SQUIBB, MD (Thoracic Surgery) Johney Malachi DASEN, MD (Radiation Oncology) Octavia Charleston, MD as Consulting Physician (Ophthalmology) Tommas Pears, MD as Consulting Physician (Endocrinology) Santo Stanly LABOR, MD as Consulting Physician (Cardiology)  HISTORY OF PRESENT ILLNESS: 68 y.o. female with a past medical history listed below presents for evaluation of AB pain.  Patient was seen 02/10/2024 for preop evaluation for TEE.   Discussed the use of AI scribe software for clinical note transcription with the patient, who gave verbal consent to proceed.  History of Present Illness   [name] is a 67 year old female with diverticulosis who presents with severe abdominal pain.  She has been experiencing severe abdominal pain for over a year,  with significant worsening since March. The pain is described as severe cramps  located below the umbilicus, radiating into the pelvis and towards the bikini line. It is exacerbated by eating, even small portions, and sometimes by drinking water . The pain is present daily, lasting all day with varying intensity.  She recalls a visit to the emergency department last year due to the severity of the pain, where she was prescribed medication for muscle spasms, which only provided partial relief. She has been using a heating pad to soothe the pain. She has a history of diverticulosis, identified during a colonoscopy in 2019, but denies recent consumption of foods that typically exacerbate diverticulosis, such as popcorn or seeds.  Current symptoms include a sensation of fullness and discomfort after eating, without nausea or vomiting, though she experiences watery mouth and mucus production. She reports soft bowel movements with occasional red streaks, but no diarrhea. She has been taking dicyclomine  and Pepto-Bismol, but finds them ineffective. She also uses a heating pad for relief.  She has a history of a knee replacement and was previously prescribed tramadol  for knee pain. She is currently on Mounjaro, an injectable medication, and reports that her abdominal pain persisted even during a three-week break from this medication. No heartburn or reflux and has not burped in 24 years. She experiences pain when moving, particularly when standing or moving her feet, and finds some relief when lying down.       She  reports that she quit smoking about 24 years ago. Her smoking use included cigarettes. She started smoking about 29 years ago. She has a 1.3 pack-year smoking history. She has never used smokeless tobacco. She reports that she does not drink alcohol and does not use drugs.  RELEVANT GI HISTORY, IMAGING AND LABS: Results   RADIOLOGY Abdominal CT: Diverticulosis, no diverticulitis (04/23/2024) CT  Angiography: Negative for diverticulitis (04/23/2024)  DIAGNOSTIC Colonoscopy: Diverticulosis (2019)      CBC    Component Value Date/Time   WBC 7.0 04/22/2024 2336   RBC 3.44 (L) 04/22/2024 2336   HGB 10.7 (L) 04/22/2024 2336   HGB 12.1 03/12/2024 1210   HGB 12.1 01/09/2015 1109   HCT 33.6 (L) 04/22/2024 2336   HCT 37.2 03/12/2024 1210   HCT 36.9 01/09/2015 1109   PLT 274 04/22/2024 2336   PLT 298 03/12/2024 1210   MCV 97.7 04/22/2024 2336   MCV 97 03/12/2024 1210   MCV 93.4 01/09/2015 1109   MCH 31.1 04/22/2024 2336   MCHC 31.8 04/22/2024 2336   RDW 11.9 04/22/2024 2336   RDW 11.9 03/12/2024 1210   RDW 12.2 01/09/2015 1109   LYMPHSABS 1.7 04/17/2023 0343   LYMPHSABS 1.9 06/20/2017 1057   LYMPHSABS 1.8 01/09/2015 1109   MONOABS 1.3 (H) 04/17/2023 0343   MONOABS 0.4 01/09/2015 1109   EOSABS 0.0 04/17/2023 0343   EOSABS 0.3 06/20/2017 1057   BASOSABS 0.0 04/17/2023 0343   BASOSABS 0.0 06/20/2017 1057   BASOSABS 0.0 01/09/2015 1109   Recent Labs    06/21/23 1827 03/12/24 1210 04/08/24 1843 04/22/24 2336  HGB 11.2* 12.1 11.9* 10.7*    CMP     Component Value Date/Time   NA 139 04/22/2024 2336   NA 144 03/12/2024 1210   NA 144 01/09/2015 1109   K 3.5 04/22/2024 2336   K 4.2 01/09/2015 1109   CL 105 04/22/2024 2336   CL 105 01/19/2013 1126   CO2 27 04/22/2024 2336   CO2 28 01/09/2015 1109   GLUCOSE 126 (H) 04/22/2024 2336   GLUCOSE 71 01/09/2015 1109  GLUCOSE 180 (H) 01/19/2013 1126   BUN 19 04/22/2024 2336   BUN 18 03/12/2024 1210   BUN 13.7 01/09/2015 1109   CREATININE 1.07 (H) 04/22/2024 2336   CREATININE 0.90 08/04/2015 1127   CREATININE 0.8 01/09/2015 1109   CALCIUM  8.6 (L) 04/22/2024 2336   CALCIUM  9.2 01/09/2015 1109   PROT 7.3 04/22/2024 2336   PROT 6.9 03/05/2021 1502   PROT 7.1 01/09/2015 1109   ALBUMIN 3.8 04/22/2024 2336   ALBUMIN 4.2 03/05/2021 1502   ALBUMIN 3.5 01/09/2015 1109   AST 19 04/22/2024 2336   AST 16 01/09/2015 1109    ALT 12 04/22/2024 2336   ALT 16 01/09/2015 1109   ALKPHOS 101 04/22/2024 2336   ALKPHOS 110 01/09/2015 1109   BILITOT 0.3 04/22/2024 2336   BILITOT 0.3 03/05/2021 1502   BILITOT 0.32 01/09/2015 1109   GFRNONAA 58 (L) 04/22/2024 2336   GFRNONAA 85 04/18/2015 1451   GFRAA 58.36 09/08/2020 0000   GFRAA >89 04/18/2015 1451      Latest Ref Rng & Units 04/22/2024   11:36 PM 04/08/2024    6:43 PM 06/21/2023    6:27 PM  Hepatic Function  Total Protein 6.5 - 8.1 g/dL 7.3  7.6  6.9   Albumin 3.5 - 5.0 g/dL 3.8  4.0  3.3   AST 15 - 41 U/L 19  17  13    ALT 0 - 44 U/L 12  11  9    Alk Phosphatase 38 - 126 U/L 101  105  102   Total Bilirubin 0.0 - 1.2 mg/dL 0.3  0.6  0.6       Current Medications:   Current Outpatient Medications (Endocrine & Metabolic):    levothyroxine (SYNTHROID, LEVOTHROID) 50 MCG tablet, Take 50 mcg by mouth daily before breakfast.    NOVOLOG  MIX 70/30 FLEXPEN (70-30) 100 UNIT/ML FlexPen, Inject 15-20 Units into the skin daily with breakfast. Take 15 units in the morning and 20 units in the eveing   tirzepatide (MOUNJARO) 7.5 MG/0.5ML Pen, Inject 7.5 mg into the skin once a week.  Current Outpatient Medications (Cardiovascular):    diltiazem  (CARDIZEM  CD) 360 MG 24 hr capsule, Take 1 capsule (360 mg total) by mouth daily.   furosemide  (LASIX ) 40 MG tablet, Take 1 tablet (40 mg total) by mouth daily as needed for fluid or edema.   metoprolol  succinate (TOPROL -XL) 50 MG 24 hr tablet, TAKE 1.5 TABLETS (75 MG TOTAL) BY MOUTH AT BEDTIME. TAKE WITH OR IMMEDIATELY FOLLOWING A MEAL.   nitroGLYCERIN  (NITROSTAT ) 0.4 MG SL tablet, Place 0.4 mg under the tongue every 5 (five) minutes as needed for chest pain.   rosuvastatin  (CRESTOR ) 20 MG tablet, Take 20 mg by mouth daily.   spironolactone  (ALDACTONE ) 25 MG tablet, TAKE 1 TABLET (25 MG TOTAL) BY MOUTH DAILY.   Current Outpatient Medications (Analgesics):    acetaminophen  (TYLENOL ) 650 MG CR tablet, Take 1,300 mg by mouth every 8  (eight) hours as needed for pain (headache).   traMADol  (ULTRAM ) 50 MG tablet, Take 50 mg by mouth every 6 (six) hours as needed for moderate pain (pain score 4-6).  Current Outpatient Medications (Hematological):    ELIQUIS  5 MG TABS tablet, TAKE 1 TABLET BY MOUTH TWICE A DAY  Current Outpatient Medications (Other):    ACCU-CHEK GUIDE test strip,    Accu-Chek Softclix Lancets lancets, USE LANCETS 3 TIMES A DAY AS DIRECTED 30 DAYS   B-D UF III MINI PEN NEEDLES 31G X 5 MM MISC, 2 (  two) times daily. as directed   Blood Glucose Monitoring Suppl (ACCU-CHEK GUIDE) w/Device KIT, See admin instructions.   co-enzyme Q-10 50 MG capsule, TAKE 1 CAPSULE BY MOUTH EVERY DAY   dicyclomine  (BENTYL ) 20 MG tablet, Take 1 tablet (20 mg total) by mouth 2 (two) times daily.   pantoprazole  (PROTONIX ) 20 MG tablet, Take 1 tablet (20 mg total) by mouth 2 (two) times daily before a meal.   tiZANidine  (ZANAFLEX ) 4 MG tablet, Take 1 tablet (4 mg total) by mouth every 6 (six) hours as needed for muscle spasms.  Medical History:  Past Medical History:  Diagnosis Date   Allergy    Anemia    Aortic insufficiency    a. Mod by echo 05/2015.   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 Abnormal stress test -> LHc 06/2015 s/p DES to mCX and mLAD, residual D2 disease treated medially.   Diverticulitis    Erythema nodosum    Heart murmur    History of kidney stones    Hypertension    Insomnia    lung ca dx'd 10/1999   chemo/xrt comp 05/29/2000   Myalgia    Obesity    Pericardial effusion    a. 05/2015 Echo: EF 55-60%, no rwma, Gr 1 DD, no effusion - but an effusion was seen on CT which was felt to be increased in size compared to 12/2014 - pericardium also thickened.    Pericarditis    a. Dx  05/2015.   Pneumothorax, spontaneous, tension 01/20/2012   October, 2010   PONV (postoperative nausea and vomiting)    Pulmonary fibrosis (HCC) 01/20/2012   Due to previous surgery and chest radiation for lung cancer   Type II diabetes mellitus (HCC)    Allergies:  Allergies  Allergen Reactions   Hydrocodone  Hives and Itching   Morphine  And Codeine  Other (See Comments)    GI PROBLEMS   Canagliflozin Diarrhea    Other Reaction(s): diarrhea   Metformin Hcl Diarrhea   Oxycodone  Hives and Itching   Rosuvastatin  Other (See Comments)    Pt reports causes lower extremity muscle aches/cramping  Other Reaction(s): muscle cramps   Lisinopril  Cough    Other Reaction(s): cough   Nsaids Other (See Comments)    GI PROBLEMS  Other Reaction(s): GI problems     Surgical History:  She  has a past surgical history that includes Lung cancer surgery (Left, 10/22/1999); Shoulder arthroscopy w/ rotator cuff repair (Right, ~ 2003); Inner ear surgery (Right, ~ 2009); Abdominal hysterectomy (10/21/2006); Colonoscopy; Knee Arthroplasty (Right, 10/21/1989); Carpal tunnel release (Bilateral, 1998-2005?); Pulmonary embolism surgery (10/21/2005); Esophagogastroduodenoscopy (egd) with esophageal dilation (10/21/2010); Knee arthroscopy (~ 2000); Foot surgery (Bilateral, 06/22/1979); Cardiac catheterization (N/A, 07/11/2015); Cardiac catheterization (07/11/2015); Appendectomy (1969?); Shoulder adhesion release (Right, ~ 2004); Lung surgery (Right, 10/21/2005); Cystoscopy w/ stone manipulation (10/22/2011); Carpal tunnel release (Left, 2009?); Coronary angioplasty; LEFT HEART CATH AND CORONARY ANGIOGRAPHY (N/A, 12/18/2016); RIGHT AND LEFT HEART CATH (N/A, 06/25/2017); Colonoscopy with propofol  (N/A, 05/28/2018); LEFT HEART CATH AND CORONARY ANGIOGRAPHY (N/A, 02/01/2021); Upper gastrointestinal endoscopy (06/05/2021); Video bronchoscopy (N/A, 08/03/2021); Bronchial washings (08/03/2021); ATRIAL FIBRILLATION ABLATION (N/A,  02/27/2022); Cholecystectomy (N/A, 10/01/2022); Total knee arthroplasty (Left, 04/14/2023); and TRANSESOPHAGEAL ECHOCARDIOGRAM (N/A, 04/01/2024). Family History:  Her family history includes  Diabetes in her father, maternal grandmother, mother, paternal grandmother, sister, sister, sister, and sister; Heart disease in her father; Hypertension in her sister; Kidney disease in her sister; Pancreatic cancer in her mother.  REVIEW OF SYSTEMS  : All other systems reviewed and negative except where noted in the History of Present Illness.  PHYSICAL EXAM: BP 114/60   Pulse 80   Ht 5' 8 (1.727 m)   Wt 217 lb (98.4 kg)   BMI 32.99 kg/m  Physical Exam   GENERAL APPEARANCE: Well nourished, in no apparent distress. HEENT: No cervical lymphadenopathy, unremarkable thyroid , sclerae anicteric, conjunctiva pink. RESPIRATORY: Respiratory effort normal, breath sounds equal bilaterally without rales, rhonchi, or wheezing. CARDIO: Regular rate and rhythm with no murmurs, rubs, or gallops, peripheral pulses intact. ABDOMEN: Soft, non-distended, active bowel sounds in all four quadrants, positive Carnett sign on epigastric palpation, sensitivity in the inguinal area. RECTAL: Declines. MUSCULOSKELETAL: Full range of motion, normal gait, without edema. SKIN: Dry, intact without rashes or lesions. No jaundice. NEURO: Alert, oriented, no focal deficits. PSYCH: Cooperative, normal mood and affect.      Alan JONELLE Coombs, PA-C 11:46 AM

## 2024-05-12 ENCOUNTER — Ambulatory Visit
Admission: RE | Admit: 2024-05-12 | Discharge: 2024-05-12 | Disposition: A | Discharge status: Home / Self Care | Source: Ambulatory Visit | Attending: Physician Assistant | Admitting: Physician Assistant

## 2024-05-12 ENCOUNTER — Ambulatory Visit: Admitting: Physician Assistant

## 2024-05-12 VITALS — BP 114/60 | HR 80 | Ht 68.0 in | Wt 217.0 lb

## 2024-05-12 DIAGNOSIS — Z9049 Acquired absence of other specified parts of digestive tract: Secondary | ICD-10-CM

## 2024-05-12 DIAGNOSIS — E119 Type 2 diabetes mellitus without complications: Secondary | ICD-10-CM

## 2024-05-12 DIAGNOSIS — R1084 Generalized abdominal pain: Secondary | ICD-10-CM

## 2024-05-12 DIAGNOSIS — K219 Gastro-esophageal reflux disease without esophagitis: Secondary | ICD-10-CM

## 2024-05-12 DIAGNOSIS — R1030 Lower abdominal pain, unspecified: Secondary | ICD-10-CM

## 2024-05-12 DIAGNOSIS — R1310 Dysphagia, unspecified: Secondary | ICD-10-CM | POA: Diagnosis not present

## 2024-05-12 DIAGNOSIS — I4891 Unspecified atrial fibrillation: Secondary | ICD-10-CM

## 2024-05-12 DIAGNOSIS — Z7901 Long term (current) use of anticoagulants: Secondary | ICD-10-CM

## 2024-05-12 DIAGNOSIS — G8929 Other chronic pain: Secondary | ICD-10-CM | POA: Diagnosis not present

## 2024-05-12 DIAGNOSIS — M25552 Pain in left hip: Secondary | ICD-10-CM

## 2024-05-12 DIAGNOSIS — R102 Pelvic and perineal pain: Secondary | ICD-10-CM | POA: Diagnosis not present

## 2024-05-12 DIAGNOSIS — M25551 Pain in right hip: Secondary | ICD-10-CM | POA: Diagnosis not present

## 2024-05-12 DIAGNOSIS — I351 Nonrheumatic aortic (valve) insufficiency: Secondary | ICD-10-CM | POA: Diagnosis not present

## 2024-05-12 DIAGNOSIS — M160 Bilateral primary osteoarthritis of hip: Secondary | ICD-10-CM | POA: Diagnosis not present

## 2024-05-12 DIAGNOSIS — Z7985 Long-term (current) use of injectable non-insulin antidiabetic drugs: Secondary | ICD-10-CM

## 2024-05-12 DIAGNOSIS — K5904 Chronic idiopathic constipation: Secondary | ICD-10-CM

## 2024-05-12 MED ORDER — PANTOPRAZOLE SODIUM 20 MG PO TBEC: 20.0000 mg | DELAYED_RELEASE_TABLET | Freq: Two times a day (BID) | ORAL | 3 refills | Status: DC

## 2024-05-12 NOTE — Patient Instructions (Signed)
 Your provider has requested that you have an abdominal x ray before leaving today. Please go to the basement floor to our Radiology department for the test.  salon pas patches are over the counter and voltern gel is topical antiinflammatory that is safe.   Gastroparesis LIKELY FROM THE GLP1 Gastroparesis is a condition in which food takes longer than normal to empty from the stomach.  This condition is also known as delayed gastric emptying. It is usually a long-term (chronic) condition.  What are the signs or symptoms? Symptoms of this condition include: Feeling full after eating very little or a loss of appetite. Nausea, vomiting, or heartburn. Bloating of your abdomen. Inconsistent blood sugar (glucose) levels on blood tests. Unexplained weight loss. Acid from the stomach coming up into the esophagus (gastroesophageal reflux). Sudden tightening (spasm) of the stomach, which can be painful. Symptoms may come and go. Some people may not notice any symptoms.  What increases the risk? You are more likely to develop this condition if: You have certain disorders or diseases. These may include: An endocrine disorder. An eating disorder. Amyloidosis. Scleroderma. Parkinson's disease. Multiple sclerosis. Cancer or infection of the stomach or the vagus nerve. You have had surgery on your stomach or vagus nerve. You take certain medicines. You are female.  Things you can do: Please do small frequent meals like 4-6 meals a day.  Eat and drink liquids at separate times.  Avoid high fiber foods, cook your vegetables, avoid high fat food.  Suggest spreading protein throughout the day (greek yogurt, glucerna, soft meat, milk, eggs) Choose soft foods that you can mash with a fork When you are more symptomatic, change to pureed foods foods and liquids.  Consider reading Living well with Gastroparesis by [facility] Check out this link to a diet  online [email or web address]

## 2024-05-12 NOTE — Progress Notes (Signed)
 I agree with the assessment and plan as outlined by Ms. Steffanie Dunn.

## 2024-05-19 ENCOUNTER — Ambulatory Visit: Payer: Self-pay | Admitting: Physician Assistant

## 2024-06-04 ENCOUNTER — Encounter (HOSPITAL_BASED_OUTPATIENT_CLINIC_OR_DEPARTMENT_OTHER): Payer: Self-pay

## 2024-06-04 ENCOUNTER — Other Ambulatory Visit: Payer: Self-pay

## 2024-06-04 DIAGNOSIS — Z7901 Long term (current) use of anticoagulants: Secondary | ICD-10-CM | POA: Insufficient documentation

## 2024-06-04 DIAGNOSIS — M25551 Pain in right hip: Secondary | ICD-10-CM | POA: Diagnosis not present

## 2024-06-04 DIAGNOSIS — Z794 Long term (current) use of insulin: Secondary | ICD-10-CM | POA: Insufficient documentation

## 2024-06-04 DIAGNOSIS — M25552 Pain in left hip: Secondary | ICD-10-CM | POA: Insufficient documentation

## 2024-06-04 DIAGNOSIS — R102 Pelvic and perineal pain: Secondary | ICD-10-CM | POA: Insufficient documentation

## 2024-06-04 DIAGNOSIS — R1030 Lower abdominal pain, unspecified: Secondary | ICD-10-CM | POA: Insufficient documentation

## 2024-06-04 NOTE — ED Triage Notes (Signed)
 Pt via POV. Reporting bilateral hip/ pelvic pain. States it came on suddenly when she tried to stand. Feels like her legs are being pushed up into her pelvis. Unable to move legs independently, has to pick them up. Able to wiggle toes. +2 pedal pulses. Denies any falls/ trauma.

## 2024-06-05 ENCOUNTER — Emergency Department (HOSPITAL_BASED_OUTPATIENT_CLINIC_OR_DEPARTMENT_OTHER)
Admission: EM | Admit: 2024-06-05 | Discharge: 2024-06-05 | Disposition: A | Discharge status: Home / Self Care | Attending: Emergency Medicine | Admitting: Emergency Medicine

## 2024-06-05 DIAGNOSIS — R1030 Lower abdominal pain, unspecified: Secondary | ICD-10-CM

## 2024-06-05 DIAGNOSIS — M25551 Pain in right hip: Secondary | ICD-10-CM

## 2024-06-05 LAB — CBC WITH DIFFERENTIAL/PLATELET
Abs Immature Granulocytes: 0.03 K/uL (ref 0.00–0.07)
Basophils Absolute: 0 K/uL (ref 0.0–0.1)
Basophils Relative: 0 %
Eosinophils Absolute: 0.4 K/uL (ref 0.0–0.5)
Eosinophils Relative: 5 %
HCT: 36.1 % (ref 36.0–46.0)
Hemoglobin: 11.7 g/dL — ABNORMAL LOW (ref 12.0–15.0)
Immature Granulocytes: 0 %
Lymphocytes Relative: 22 %
Lymphs Abs: 1.9 K/uL (ref 0.7–4.0)
MCH: 31.4 pg (ref 26.0–34.0)
MCHC: 32.4 g/dL (ref 30.0–36.0)
MCV: 96.8 fL (ref 80.0–100.0)
Monocytes Absolute: 0.7 K/uL (ref 0.1–1.0)
Monocytes Relative: 9 %
Neutro Abs: 5.4 K/uL (ref 1.7–7.7)
Neutrophils Relative %: 64 %
Platelets: 270 K/uL (ref 150–400)
RBC: 3.73 MIL/uL — ABNORMAL LOW (ref 3.87–5.11)
RDW: 12 % (ref 11.5–15.5)
WBC: 8.4 K/uL (ref 4.0–10.5)
nRBC: 0 % (ref 0.0–0.2)

## 2024-06-05 LAB — URINALYSIS, ROUTINE W REFLEX MICROSCOPIC
Bilirubin Urine: NEGATIVE
Glucose, UA: NEGATIVE mg/dL
Ketones, ur: NEGATIVE mg/dL
Leukocytes,Ua: NEGATIVE
Nitrite: NEGATIVE
Protein, ur: NEGATIVE mg/dL
Specific Gravity, Urine: 1.034 — ABNORMAL HIGH (ref 1.005–1.030)
pH: 5.5 (ref 5.0–8.0)

## 2024-06-05 LAB — BASIC METABOLIC PANEL WITH GFR
Anion gap: 11 (ref 5–15)
BUN: 23 mg/dL (ref 8–23)
CO2: 26 mmol/L (ref 22–32)
Calcium: 9.4 mg/dL (ref 8.9–10.3)
Chloride: 105 mmol/L (ref 98–111)
Creatinine, Ser: 0.94 mg/dL (ref 0.44–1.00)
GFR, Estimated: >60 mL/min (ref >60)
Glucose, Bld: 160 mg/dL — ABNORMAL HIGH (ref 70–99)
Potassium: 3.5 mmol/L (ref 3.5–5.1)
Sodium: 142 mmol/L (ref 135–145)

## 2024-06-05 MED ORDER — KETOROLAC TROMETHAMINE 30 MG/ML IJ SOLN
15.0000 mg | Freq: Once | INTRAMUSCULAR | Status: AC
  Administered 2024-06-05: 15 mg via INTRAVENOUS
  Filled 2024-06-05: qty 1

## 2024-06-05 MED ORDER — ONDANSETRON HCL 4 MG/2ML IJ SOLN
4.0000 mg | Freq: Once | INTRAMUSCULAR | Status: AC
  Administered 2024-06-05: 4 mg via INTRAVENOUS
  Filled 2024-06-05: qty 2

## 2024-06-05 MED ORDER — FENTANYL CITRATE PF 50 MCG/ML IJ SOSY
25.0000 ug | PREFILLED_SYRINGE | Freq: Once | INTRAMUSCULAR | Status: AC
  Administered 2024-06-05: 25 ug via INTRAVENOUS
  Filled 2024-06-05: qty 1

## 2024-06-05 MED ORDER — HYDROMORPHONE HCL 1 MG/ML IJ SOLN
0.5000 mg | Freq: Once | INTRAMUSCULAR | Status: AC
  Administered 2024-06-05: 0.5 mg via INTRAVENOUS
  Filled 2024-06-05: qty 1

## 2024-06-05 NOTE — ED Provider Notes (Signed)
 [place] EMERGENCY DEPARTMENT AT [facility] Provider Note   CSN: [id number] Arrival date & time: 06/04/24  2337     Patient presents with: Hip Pain and Pelvic Pain   [name] is a 68 y.o. female.   Presents to the emergency department for evaluation of bilateral hip and groin pain.  Patient reports that she has been having chronic lower abdominal pain problems for a while.  She has been seen in the emergency department a couple of times and has followed up with GI.  Patient reports that she has some daily pain, but today she stood up from a seated position and had sudden onset of severe pain in her groin bilaterally.       Prior to Admission medications   Medication Sig Start Date End Date Taking? Authorizing Provider  ACCU-CHEK GUIDE test strip  05/23/22   [provider]  Accu-Chek Softclix Lancets lancets USE LANCETS 3 TIMES A DAY AS DIRECTED 30 DAYS 11/12/19   [provider]  acetaminophen  (TYLENOL ) 650 MG CR tablet Take 1,300 mg by mouth every 8 (eight) hours as needed for pain (headache).    [provider]  B-D UF III MINI PEN NEEDLES 31G X 5 MM MISC 2 (two) times daily. as directed 10/31/18   [provider]  Blood Glucose Monitoring Suppl (ACCU-CHEK GUIDE) w/Device KIT See admin instructions. 11/12/19   [provider]  co-enzyme Q-10 50 MG capsule TAKE 1 CAPSULE BY MOUTH EVERY DAY 11/30/19   Maranda Leim DEL, MD  dicyclomine  (BENTYL ) 20 MG tablet Take 1 tablet (20 mg total) by mouth 2 (two) times daily. 04/08/24   Prosperi, Christian H, PA-C  diltiazem  (CARDIZEM  CD) 360 MG 24 hr capsule Take 1 capsule (360 mg total) by mouth daily. 09/23/23   Camnitz, Soyla Lunger, MD  ELIQUIS  5 MG TABS tablet TAKE 1 TABLET BY MOUTH TWICE A DAY 12/08/23   Camnitz, Soyla Lunger, MD  furosemide  (LASIX ) 40 MG tablet Take 1 tablet (40 mg total) by mouth daily as needed for fluid or edema. 03/15/21   Claudene Victory ORN, MD  levothyroxine (SYNTHROID,  LEVOTHROID) 50 MCG tablet Take 50 mcg by mouth daily before breakfast.  05/14/17   [provider]  metoprolol  succinate (TOPROL -XL) 50 MG 24 hr tablet TAKE 1.5 TABLETS (75 MG TOTAL) BY MOUTH AT BEDTIME. TAKE WITH OR IMMEDIATELY FOLLOWING A MEAL. 02/27/24   Camnitz, Soyla Lunger, MD  nitroGLYCERIN  (NITROSTAT ) 0.4 MG SL tablet Place 0.4 mg under the tongue every 5 (five) minutes as needed for chest pain.    [provider]  NOVOLOG  MIX 70/30 FLEXPEN (70-30) 100 UNIT/ML FlexPen Inject 15-20 Units into the skin daily with breakfast. Take 15 units in the morning and 20 units in the eveing 04/22/22   [provider]  pantoprazole  (PROTONIX ) 20 MG tablet Take 1 tablet (20 mg total) by mouth 2 (two) times daily before a meal. 05/12/24   Craig Alan SAUNDERS, PA-C  rosuvastatin  (CRESTOR ) 20 MG tablet Take 20 mg by mouth daily. 03/06/23   [provider]  spironolactone  (ALDACTONE ) 25 MG tablet TAKE 1 TABLET (25 MG TOTAL) BY MOUTH DAILY. 07/23/22   Lesia Ozell Barter, PA-C  tirzepatide (MOUNJARO) 7.5 MG/0.5ML Pen Inject 7.5 mg into the skin once a week. 09/29/23   [provider]  tiZANidine  (ZANAFLEX ) 4 MG tablet Take 1 tablet (4 mg total) by mouth every 6 (six) hours as needed for muscle spasms. 04/08/24   Prosperi, Sherlean  H, PA-C  traMADol  (ULTRAM ) 50 MG tablet Take 50 mg by mouth every 6 (six) hours as needed for moderate pain (pain score 4-6).    [provider]    Allergies: Hydrocodone , Morphine  and codeine , Canagliflozin, Metformin hcl, Oxycodone , Rosuvastatin , Lisinopril , and Nsaids    Review of Systems  Updated Vital Signs BP (!) 132/55   Pulse 66   Temp 98.5 F (36.9 C) (Oral)   Resp 18   SpO2 93%   Physical Exam Vitals and nursing note reviewed.  Constitutional:      General: She is not in acute distress.    Appearance: She is well-developed.  HENT:     Head: Normocephalic and atraumatic.     Mouth/Throat:     Mouth: Mucous membranes  are moist.  Eyes:     General: Vision grossly intact. Gaze aligned appropriately.     Extraocular Movements: Extraocular movements intact.     Conjunctiva/sclera: Conjunctivae normal.  Cardiovascular:     Rate and Rhythm: Normal rate and regular rhythm.     Pulses: Normal pulses.     Heart sounds: Normal heart sounds, S1 normal and S2 normal. No murmur heard.    No friction rub. No gallop.  Pulmonary:     Effort: Pulmonary effort is normal. No respiratory distress.     Breath sounds: Normal breath sounds.  Abdominal:     General: Bowel sounds are normal.     Palpations: Abdomen is soft.     Tenderness: There is no abdominal tenderness. There is no guarding or rebound.     Hernia: No hernia is present.  Musculoskeletal:        General: No swelling.     Cervical back: Full passive range of motion without pain, normal range of motion and neck supple. No spinous process tenderness or muscular tenderness. Normal range of motion.     Right hip: Decreased range of motion (Decreased flexion secondary to pain).     Left hip: Decreased range of motion (Decreased flexion secondary to pain).     Right lower leg: No edema.     Left lower leg: No edema.     Comments: Patient with limited ability to lift legs and flex at the hip secondary to painful inhibition.  Normal sensation and normal strength distally.  Skin:    General: Skin is warm and dry.     Capillary Refill: Capillary refill takes less than 2 seconds.     Findings: No ecchymosis, erythema, rash or wound.  Neurological:     General: No focal deficit present.     Mental Status: She is alert and oriented to person, place, and time.     GCS: GCS eye subscore is 4. GCS verbal subscore is 5. GCS motor subscore is 6.     Cranial Nerves: Cranial nerves 2-12 are intact.     Sensory: Sensation is intact.     Motor: Motor function is intact.     Coordination: Coordination is intact.  Psychiatric:        Attention and Perception: Attention  normal.        Mood and Affect: Mood normal.        Speech: Speech normal.        Behavior: Behavior normal.        Thought Content: Thought content normal.     (all labs ordered are listed, but only abnormal results are displayed) Labs Reviewed  CBC WITH DIFFERENTIAL/PLATELET - Abnormal; Notable for the following  components:      Result Value   RBC 3.73 (*)    Hemoglobin 11.7 (*)    All other components within normal limits  BASIC METABOLIC PANEL WITH GFR - Abnormal; Notable for the following components:   Glucose, Bld 160 (*)    All other components within normal limits  URINALYSIS, ROUTINE W REFLEX MICROSCOPIC - Abnormal; Notable for the following components:   Specific Gravity, Urine 1.034 (*)    Hgb urine dipstick SMALL (*)    Bacteria, UA RARE (*)    All other components within normal limits    EKG: None  Radiology: No results found.   Procedures   Medications Ordered in the ED  ondansetron  (ZOFRAN ) injection 4 mg (4 mg Intravenous Given 06/05/24 0100)  HYDROmorphone  (DILAUDID ) injection 0.5 mg (0.5 mg Intravenous Given 06/05/24 0059)  ketorolac  (TORADOL ) 30 MG/ML injection 15 mg (15 mg Intravenous Given 06/05/24 0248)  fentaNYL  (SUBLIMAZE ) injection 25 mcg (25 mcg Intravenous Given 06/05/24 0454)                                    Medical Decision Making Amount and/or Complexity of Data Reviewed Labs: ordered.  Risk Prescription drug management.   Patient presents to the emergency department stating that she is having lower abdominal and pelvic pain.  She reports that the pain suddenly started after she sat up from a seated position.  She has been seen multiple times in the past for this.  She is actually having pain in the inguinal creases bilaterally that worsens with trying to flex her hips.  Abdominal exam is benign.  She has had multiple CAT scans for the symptoms in the past which have been unhelpful.  Vital signs are unremarkable.  Lab work is  unremarkable.  She has been seen by GI for this.  X-rays of her hips in the past have shown very slight arthritis.  Etiology of this pain is unclear at this time but I do not believe she has an emergency condition that requires further ER workup or hospitalization.  She was treated with analgesia in the ED.  She has multiple drug allergies that make outpatient analgesia difficult.     Final diagnoses:  Bilateral hip pain  Lower abdominal pain    ED Discharge Orders     None          Haze Lonni PARAS, MD 06/05/24 [phone]

## 2024-06-05 NOTE — ED Notes (Signed)
 Pt discharged home and given discharge paperwork. Opportunities given for questions. Pt verbalizes understanding. PIV removed x1. Bethena Powell SAUNDERS , RN

## 2024-06-06 ENCOUNTER — Encounter (HOSPITAL_COMMUNITY): Payer: Self-pay

## 2024-06-06 ENCOUNTER — Other Ambulatory Visit: Payer: Self-pay

## 2024-06-06 ENCOUNTER — Emergency Department (HOSPITAL_COMMUNITY)
Admission: EM | Admit: 2024-06-06 | Discharge: 2024-06-06 | Disposition: A | Discharge status: Home / Self Care | Attending: Emergency Medicine | Admitting: Emergency Medicine

## 2024-06-06 DIAGNOSIS — Z7901 Long term (current) use of anticoagulants: Secondary | ICD-10-CM | POA: Insufficient documentation

## 2024-06-06 DIAGNOSIS — M25551 Pain in right hip: Secondary | ICD-10-CM | POA: Insufficient documentation

## 2024-06-06 DIAGNOSIS — R1084 Generalized abdominal pain: Secondary | ICD-10-CM | POA: Diagnosis not present

## 2024-06-06 DIAGNOSIS — M25552 Pain in left hip: Secondary | ICD-10-CM | POA: Insufficient documentation

## 2024-06-06 DIAGNOSIS — R1030 Lower abdominal pain, unspecified: Secondary | ICD-10-CM | POA: Diagnosis not present

## 2024-06-06 DIAGNOSIS — Z794 Long term (current) use of insulin: Secondary | ICD-10-CM | POA: Insufficient documentation

## 2024-06-06 MED ORDER — TRAMADOL HCL 50 MG PO TABS: 50.0000 mg | ORAL_TABLET | Freq: Four times a day (QID) | ORAL | 0 refills | Status: AC | PRN

## 2024-06-06 NOTE — ED Provider Notes (Signed)
 [place] EMERGENCY DEPARTMENT AT [facility] Provider Note   CSN: [id number] Arrival date & time: 06/06/24  1313     Patient presents with: Pelvic Pain   [name] is a 68 y.o. female.   68 year old female presenting with bilateral inguinal/lower abdominal pain.  Patient was seen yesterday at [facility] for the same complaint, she is here today requesting an MRI because her pelvic pain has been ongoing for several months.  She denies fever, diarrhea, nausea/vomiting; she reports a bowel movement every few days and she does take MiraLAX  for relief of her constipation.  She tells me that when she moves her legs or stands up straight she has pain in her inguinal creases that radiates to her lower abdomen, her pain is not exacerbated by eating.  She has had imaging done for this in the past, all which has been unremarkable.  She was prescribed oxycodone  for this pain back in July but tells me that she has an allergy and cannot take this, as it causes hives/itching. She is requesting fentanyl .    Pelvic Pain       Prior to Admission medications   Medication Sig Start Date End Date Taking? Authorizing Provider  ACCU-CHEK GUIDE test strip  05/23/22   [provider]  Accu-Chek Softclix Lancets lancets USE LANCETS 3 TIMES A DAY AS DIRECTED 30 DAYS 11/12/19   [provider]  acetaminophen  (TYLENOL ) 650 MG CR tablet Take 1,300 mg by mouth every 8 (eight) hours as needed for pain (headache).    [provider]  B-D UF III MINI PEN NEEDLES 31G X 5 MM MISC 2 (two) times daily. as directed 10/31/18   [provider]  Blood Glucose Monitoring Suppl (ACCU-CHEK GUIDE) w/Device KIT See admin instructions. 11/12/19   [provider]  co-enzyme Q-10 50 MG capsule TAKE 1 CAPSULE BY MOUTH EVERY DAY 11/30/19   Maranda Leim DEL, MD  dicyclomine  (BENTYL ) 20 MG tablet Take 1 tablet (20 mg total) by mouth 2 (two) times daily. 04/08/24   Prosperi, Christian  H, PA-C  diltiazem  (CARDIZEM  CD) 360 MG 24 hr capsule Take 1 capsule (360 mg total) by mouth daily. 09/23/23   Camnitz, Soyla Lunger, MD  ELIQUIS  5 MG TABS tablet TAKE 1 TABLET BY MOUTH TWICE A DAY 12/08/23   Camnitz, Soyla Lunger, MD  furosemide  (LASIX ) 40 MG tablet Take 1 tablet (40 mg total) by mouth daily as needed for fluid or edema. 03/15/21   Claudene Victory ORN, MD  levothyroxine (SYNTHROID, LEVOTHROID) 50 MCG tablet Take 50 mcg by mouth daily before breakfast.  05/14/17   [provider]  metoprolol  succinate (TOPROL -XL) 50 MG 24 hr tablet TAKE 1.5 TABLETS (75 MG TOTAL) BY MOUTH AT BEDTIME. TAKE WITH OR IMMEDIATELY FOLLOWING A MEAL. 02/27/24   Camnitz, Soyla Lunger, MD  nitroGLYCERIN  (NITROSTAT ) 0.4 MG SL tablet Place 0.4 mg under the tongue every 5 (five) minutes as needed for chest pain.    [provider]  NOVOLOG  MIX 70/30 FLEXPEN (70-30) 100 UNIT/ML FlexPen Inject 15-20 Units into the skin daily with breakfast. Take 15 units in the morning and 20 units in the eveing 04/22/22   [provider]  pantoprazole  (PROTONIX ) 20 MG tablet Take 1 tablet (20 mg total) by mouth 2 (two) times daily before a meal. 05/12/24   Craig Alan SAUNDERS, PA-C  rosuvastatin  (CRESTOR ) 20 MG tablet Take 20 mg by mouth daily. 03/06/23   [provider]  spironolactone  (ALDACTONE ) 25  MG tablet TAKE 1 TABLET (25 MG TOTAL) BY MOUTH DAILY. 07/23/22   Lesia Ozell Barter, PA-C  tirzepatide (MOUNJARO) 7.5 MG/0.5ML Pen Inject 7.5 mg into the skin once a week. 09/29/23   [provider]  tiZANidine  (ZANAFLEX ) 4 MG tablet Take 1 tablet (4 mg total) by mouth every 6 (six) hours as needed for muscle spasms. 04/08/24   Prosperi, Christian H, PA-C  traMADol  (ULTRAM ) 50 MG tablet Take 50 mg by mouth every 6 (six) hours as needed for moderate pain (pain score 4-6).    [provider]    Allergies: Hydrocodone , Morphine  and codeine , Canagliflozin, Metformin hcl, Oxycodone , Rosuvastatin ,  Lisinopril , and Nsaids    Review of Systems  Genitourinary:  Positive for pelvic pain.    Updated Vital Signs  Vitals:   06/06/24 1319 06/06/24 1322  BP: (!) 104/55   Pulse: 87   Resp: 16   Temp: 97.7 F (36.5 C)   TempSrc: Oral   SpO2: 97%   Weight:  98.4 kg  Height:  5' 8 (1.727 m)     Physical Exam Vitals and nursing note reviewed.  HENT:     Head: Normocephalic.  Eyes:     Extraocular Movements: Extraocular movements intact.  Cardiovascular:     Rate and Rhythm: Normal rate.  Pulmonary:     Effort: Pulmonary effort is normal.  Abdominal:     Palpations: Abdomen is soft.     Tenderness: There is abdominal tenderness (generalized). There is no guarding.  Musculoskeletal:     Cervical back: Normal range of motion.     Comments: Lower extremities: Range of motion is limited secondary to pain, specifically hip flexion. Tenderness to deep palpation to bilateral inguinal creases, no evidence of hernia or other abnormality Generalized TTP to pelvis  Skin:    General: Skin is warm and dry.  Neurological:     Mental Status: She is alert and oriented to person, place, and time.     (all labs ordered are listed, but only abnormal results are displayed) Labs Reviewed - No data to display  EKG: None  Radiology: No results found.   Procedures   Medications Ordered in the ED - No data to display                                  Medical Decision Making This patient presents to the ED for concern of pelvic/abdominal pain, this involves an extensive number of treatment options, and is a complaint that carries with it a high risk of complications and morbidity.  The differential diagnosis includes musculoskeletal pain/strain/sprain, osteoarthritis, avascular necrosis.   Co morbidities that complicate the patient evaluation  Type 2 diabetes, hypertension, CAD   Additional history obtained:  Additional history obtained from record review External records from  outside source obtained and reviewed including recent emergency department notes, lab/imaging results from previous ED visits   Cardiac Monitoring: / EKG:  The patient was maintained on a cardiac monitor.  I personally viewed and interpreted the cardiac monitored which showed an underlying rhythm of: NSR   Problem List / ED Course / Critical interventions / Medication management I have reviewed the patients home medicines and have made adjustments as needed   Social Determinants of Health:  Tobacco use   Test / Admission - Considered:  Physical exam is notable as above, patient tells me that her symptoms in her lower abdomen/pelvis are exacerbated  specifically with hip flexion and standing upright, I have a very low suspicion for intra-abdominal pathology contributing to her symptoms, as it sounds musculoskeletal in nature and may be attributed to 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  She has no neurogenic symptoms associated with her pain, no lower extremity numbness/tingling. Patient has been seen multiple times for the same complaint, has undergone bilateral hip x-rays, an abdominal x-ray, a CT of her abdomen/pelvis, and a CT angio study of her abdomen/pelvis.  Based on these above findings, as well as reassuring labs completed at [facility] yesterday, I do not feel that additional labs/imaging are warranted at this time.  I am unsure if there is any drug-seeking component to her complaint, as essentially anywhere I touch her on physical exam she exhibits exquisite tenderness, including her pelvis/inguinal creases/abdomen, however she is easily distractible from this pain and is not consistent on examination.  She was prescribed oxycodone  by a provider in July, she tells me that she cannot take this as she has a history of an allergy resulting in itching/hives, this is documented in her allergy list.  I will refill a couple of days of tramadol  for  management of her pain, as this is a medication she has tolerated previously in the past but has not been refilled since the fall 2024. She is established with Dr. Yvone with orthopedic surgery, I recommend that she follow-up with them as I suspect that this is a orthopedic issue.  She voiced understanding and is in agreement with this plan.  Return precautions discussed, she is appropriate discharge at this time.  Staffed with Dr. Franklyn  Risk Prescription drug management.        Final diagnoses:  Bilateral hip pain  Lower abdominal pain    ED Discharge Orders          Ordered    traMADol  (ULTRAM ) 50 MG tablet  Every 6 hours PRN        06/06/24 1433               [name] Rocky SAILOR, PA-C 06/06/24 1508    Franklyn Sid SAILOR, MD 06/06/24 1926

## 2024-06-06 NOTE — Discharge Instructions (Addendum)
 Follow-up with your primary care provider.  Follow-up with your orthopedic specialist Dr. Yvone for further assessment of your pain.  Continue Tylenol  as needed for pain.  Start Tramadol , use every 6 hours as needed for breakthrough pain.  Return to the emergency department if your symptoms worsen.

## 2024-06-06 NOTE — ED Triage Notes (Signed)
 Patient has been seen several times over the past 2 months for pelvic pain and most recent yesterday.  Reports suprapubic pain and when she stands up it hurts.  REports she can't lift her left leg due to pain and can't walk having to use crutches. Patient has had mutliple imaging done with no answer. Patient requesting an MRI.

## 2024-06-07 DIAGNOSIS — M160 Bilateral primary osteoarthritis of hip: Secondary | ICD-10-CM | POA: Diagnosis not present

## 2024-06-10 ENCOUNTER — Ambulatory Visit: Admitting: Physician Assistant

## 2024-06-15 DIAGNOSIS — E039 Hypothyroidism, unspecified: Secondary | ICD-10-CM | POA: Diagnosis not present

## 2024-06-15 DIAGNOSIS — E7800 Pure hypercholesterolemia, unspecified: Secondary | ICD-10-CM | POA: Diagnosis not present

## 2024-06-15 DIAGNOSIS — E1165 Type 2 diabetes mellitus with hyperglycemia: Secondary | ICD-10-CM | POA: Diagnosis not present

## 2024-06-22 DIAGNOSIS — I2510 Atherosclerotic heart disease of native coronary artery without angina pectoris: Secondary | ICD-10-CM | POA: Diagnosis not present

## 2024-06-22 DIAGNOSIS — R109 Unspecified abdominal pain: Secondary | ICD-10-CM | POA: Diagnosis not present

## 2024-06-22 DIAGNOSIS — E1165 Type 2 diabetes mellitus with hyperglycemia: Secondary | ICD-10-CM | POA: Diagnosis not present

## 2024-06-22 DIAGNOSIS — I10 Essential (primary) hypertension: Secondary | ICD-10-CM | POA: Diagnosis not present

## 2024-06-22 DIAGNOSIS — I4891 Unspecified atrial fibrillation: Secondary | ICD-10-CM | POA: Diagnosis not present

## 2024-06-22 DIAGNOSIS — E039 Hypothyroidism, unspecified: Secondary | ICD-10-CM | POA: Diagnosis not present

## 2024-06-22 DIAGNOSIS — E7800 Pure hypercholesterolemia, unspecified: Secondary | ICD-10-CM | POA: Diagnosis not present

## 2024-06-23 ENCOUNTER — Other Ambulatory Visit: Payer: Self-pay | Admitting: Internal Medicine

## 2024-06-23 ENCOUNTER — Encounter: Payer: Self-pay | Admitting: Internal Medicine

## 2024-06-23 ENCOUNTER — Encounter (HOSPITAL_COMMUNITY): Payer: Self-pay

## 2024-06-23 DIAGNOSIS — R102 Pelvic and perineal pain: Secondary | ICD-10-CM | POA: Diagnosis not present

## 2024-06-23 DIAGNOSIS — R1032 Left lower quadrant pain: Secondary | ICD-10-CM | POA: Diagnosis not present

## 2024-06-23 DIAGNOSIS — E1165 Type 2 diabetes mellitus with hyperglycemia: Secondary | ICD-10-CM | POA: Diagnosis not present

## 2024-06-24 ENCOUNTER — Ambulatory Visit
Admission: RE | Admit: 2024-06-24 | Discharge: 2024-06-24 | Disposition: A | Discharge status: Home / Self Care | Source: Ambulatory Visit | Attending: Internal Medicine | Admitting: Internal Medicine

## 2024-06-24 DIAGNOSIS — R102 Pelvic and perineal pain: Secondary | ICD-10-CM

## 2024-06-25 ENCOUNTER — Ambulatory Visit (HOSPITAL_COMMUNITY)
Admission: RE | Admit: 2024-06-25 | Discharge: 2024-06-25 | Disposition: A | Discharge status: Home / Self Care | Source: Ambulatory Visit | Attending: Internal Medicine | Admitting: Internal Medicine

## 2024-06-25 ENCOUNTER — Other Ambulatory Visit: Payer: Self-pay | Admitting: Internal Medicine

## 2024-06-25 DIAGNOSIS — I370 Nonrheumatic pulmonary valve stenosis: Secondary | ICD-10-CM | POA: Insufficient documentation

## 2024-06-25 DIAGNOSIS — I2510 Atherosclerotic heart disease of native coronary artery without angina pectoris: Secondary | ICD-10-CM

## 2024-06-25 DIAGNOSIS — I4819 Other persistent atrial fibrillation: Secondary | ICD-10-CM

## 2024-06-25 MED ORDER — GADOBUTROL 1 MMOL/ML IV SOLN
10.0000 mL | Freq: Once | INTRAVENOUS | Status: AC | PRN
  Administered 2024-06-25: 10 mL via INTRAVENOUS

## 2024-07-01 ENCOUNTER — Other Ambulatory Visit: Payer: Self-pay | Admitting: Physician Assistant

## 2024-07-02 ENCOUNTER — Ambulatory Visit: Payer: Self-pay

## 2024-07-02 DIAGNOSIS — I370 Nonrheumatic pulmonary valve stenosis: Secondary | ICD-10-CM

## 2024-07-02 DIAGNOSIS — I351 Nonrheumatic aortic (valve) insufficiency: Secondary | ICD-10-CM

## 2024-07-02 DIAGNOSIS — I4819 Other persistent atrial fibrillation: Secondary | ICD-10-CM

## 2024-07-02 DIAGNOSIS — I2510 Atherosclerotic heart disease of native coronary artery without angina pectoris: Secondary | ICD-10-CM

## 2024-07-06 NOTE — Progress Notes (Unsigned)
 07/06/2024 [name] [id number] 11/10/1955  Referring provider: Vernon Velna SAUNDERS, MD Primary GI doctor: Dr. Federico (Dr. Teressa)  ASSESSMENT AND PLAN:  Lower AB pain x 1 year, worse since March 2019 showed pandiverticulosis otherwise negative Recall 2029 ER visit 04/08/2024 and 04/22/2024  04/08/2024 CTAPW tics but no inflammation 04/23/2024 CT angio unremarkable 05/12/2024 KUB showed moderate stool burden 05/12/2024 bilateral hip x-ray osteoarthritis bilateral hips ER visit 08/16 and 08/19 Chronic abdominal pain with cramping, exacerbated by eating, possibly due to IBS-c,  gastroparesis from Mounjaro. Positive Carnett's sign suggests abdominal wall pain.  - linzess  samples for possible IBS-C - consider Cymbalta or gabapentin  for nerve pain/hypersensitivity - Initiate gastroparesis diet. - consider repeat colonoscopy versus MRI def  Dysphagia/GERD S/p cholecystectomy  Index of symptoms: hoarseness, feeling of upper esophageal/orophangeal dysphagia with liquids, pills and solids for a year, denies GERD, no weight loss.  05/2021 EGD for dysphagia benign proximal esophageal stricture 22 cm dilated and a few small AVMs in the esophagus unchanged polypoid lesion stomach unchanged over 13 years 02/09/24 barium swallow showed dysmotility with retrograde propulsion of barium bolus into upper esophagus, areas of mild extrinsic compression at the level of aortic notch as well as posterior stomach  that corresponds with poor progression of barium bolus CT chest normal aorta 07/2023 Possibly to [facility] contributing to symptoms versus dysphagia aortic. States well controlled at this time, denies reflux, nausea, worsening swallow issues - add on pepcid - gastroparesis diet given  Moderate AI and mild pulmonary stenosis 04/01/2024 echo TEE without any issues passing scope showed EF 60 to 65% normal RVSP F no thrombus small pericardial effusion mild MR aortic valve regurgitation moderate no  aortic stenosis none holodiastolic flow reversal ascending aorta Has some DOE, leg swelling improves at night, no chest pain MR cardiac showed stable leaky aortic valve and mild pulmonary stenosis repeat 1 year  CAD status post stents Follows with Dr. Victory Sharps Pending LHC/RHC  Atrial fibrillation On Eliquis   Congestive heart failure with preserved EF 01/26/24 echo EF 65-70%, severe aortic valve regurg with holodiastolic backflow in proximal descending aorta  Type 2 diabetes insulin -dependent On Mounjaro x last year Discussed GLP1 with the patient, mechanism of action. Gastroparesis diet given to the patient.  Patient should be instructed to hold this medications if dose falls within 7 days of endoscopic procedure, due to increased risk of retained gastric contents.  Screening colonoscopy 2019 showed pandiverticulosis otherwise negative Recall 2029  Anemia 06/05/2024  HGB 11.7 MCV 96.8 Platelets 270 Recent Labs    03/12/24 1210 04/08/24 1843 04/22/24 2336 06/05/24 0042  HGB 12.1 11.9* 10.7* 11.7*  -Check iron, ferritin, B12  Patient Care Team: Vernon Velna SAUNDERS, MD as PCP - General (Internal Medicine) Inocencio Soyla Lunger, MD as PCP - Electrophysiology (Cardiology) Santo Stanly LABOR, MD as PCP - Cardiology (Cardiology) Carlie Clark, MD Teressa Toribio SQUIBB, MD (Inactive) (Gastroenterology) Yvone Rush, MD (Orthopedic Surgery) Freddie Lynwood CHRISTELLA, MD (Hematology and Oncology) Brantley Nancyann SQUIBB, MD (Thoracic Surgery) Johney Malachi DASEN, MD (Radiation Oncology) Octavia Charleston, MD as Consulting Physician (Ophthalmology) Tommas Pears, MD as Consulting Physician (Endocrinology) Santo Stanly LABOR, MD as Consulting Physician (Cardiology)  HISTORY OF PRESENT ILLNESS: 68 y.o. female with a past medical history listed below presents for evaluation of AB pain.  Patient was seen 05/12/2024   Discussed the use of AI scribe software for clinical note transcription  with the patient, who gave verbal consent to proceed.  History of Present Illness  She  reports that she quit smoking about 24 years ago. Her smoking use included cigarettes. She started smoking about 29 years ago. She has a 1.3 pack-year smoking history. She has never used smokeless tobacco. She reports that she does not drink alcohol and does not use drugs.  RELEVANT GI HISTORY, IMAGING AND LABS: Results          CBC    Component Value Date/Time   WBC 8.4 06/05/2024 0042   RBC 3.73 (L) 06/05/2024 0042   HGB 11.7 (L) 06/05/2024 0042   HGB 12.1 03/12/2024 1210   HGB 12.1 01/09/2015 1109   HCT 36.1 06/05/2024 0042   HCT 37.2 03/12/2024 1210   HCT 36.9 01/09/2015 1109   PLT 270 06/05/2024 0042   PLT 298 03/12/2024 1210   MCV 96.8 06/05/2024 0042   MCV 97 03/12/2024 1210   MCV 93.4 01/09/2015 1109   MCH 31.4 06/05/2024 0042   MCHC 32.4 06/05/2024 0042   RDW 12.0 06/05/2024 0042   RDW 11.9 03/12/2024 1210   RDW 12.2 01/09/2015 1109   LYMPHSABS 1.9 06/05/2024 0042   LYMPHSABS 1.9 06/20/2017 1057   LYMPHSABS 1.8 01/09/2015 1109   MONOABS 0.7 06/05/2024 0042   MONOABS 0.4 01/09/2015 1109   EOSABS 0.4 06/05/2024 0042   EOSABS 0.3 06/20/2017 1057   BASOSABS 0.0 06/05/2024 0042   BASOSABS 0.0 06/20/2017 1057   BASOSABS 0.0 01/09/2015 1109   Recent Labs    03/12/24 1210 04/08/24 1843 04/22/24 2336 06/05/24 0042  HGB 12.1 11.9* 10.7* 11.7*    CMP     Component Value Date/Time   NA 142 06/05/2024 0042   NA 144 03/12/2024 1210   NA 144 01/09/2015 1109   K 3.5 06/05/2024 0042   K 4.2 01/09/2015 1109   CL 105 06/05/2024 0042   CL 105 01/19/2013 1126   CO2 26 06/05/2024 0042   CO2 28 01/09/2015 1109   GLUCOSE 160 (H) 06/05/2024 0042   GLUCOSE 71 01/09/2015 1109   GLUCOSE 180 (H) 01/19/2013 1126   BUN 23 06/05/2024 0042   BUN 18 03/12/2024 1210   BUN 13.7 01/09/2015 1109   CREATININE 0.94 06/05/2024 0042   CREATININE 0.90 08/04/2015 1127   CREATININE  0.8 01/09/2015 1109   CALCIUM  9.4 06/05/2024 0042   CALCIUM  9.2 01/09/2015 1109   PROT 7.3 04/22/2024 2336   PROT 6.9 03/05/2021 1502   PROT 7.1 01/09/2015 1109   ALBUMIN 3.8 04/22/2024 2336   ALBUMIN 4.2 03/05/2021 1502   ALBUMIN 3.5 01/09/2015 1109   AST 19 04/22/2024 2336   AST 16 01/09/2015 1109   ALT 12 04/22/2024 2336   ALT 16 01/09/2015 1109   ALKPHOS 101 04/22/2024 2336   ALKPHOS 110 01/09/2015 1109   BILITOT 0.3 04/22/2024 2336   BILITOT 0.3 03/05/2021 1502   BILITOT 0.32 01/09/2015 1109   GFRNONAA >60 06/05/2024 0042   GFRNONAA 85 04/18/2015 1451   GFRAA 58.36 09/08/2020 0000   GFRAA >89 04/18/2015 1451      Latest Ref Rng & Units 04/22/2024   11:36 PM 04/08/2024    6:43 PM 06/21/2023    6:27 PM  Hepatic Function  Total Protein 6.5 - 8.1 g/dL 7.3  7.6  6.9   Albumin 3.5 - 5.0 g/dL 3.8  4.0  3.3   AST 15 - 41 U/L 19  17  13    ALT 0 - 44 U/L 12  11  9    Alk Phosphatase 38 - 126 U/L 101  105  102   Total Bilirubin 0.0 - 1.2 mg/dL 0.3  0.6  0.6       Current Medications:   Current Outpatient Medications (Endocrine & Metabolic):    levothyroxine (SYNTHROID, LEVOTHROID) 50 MCG tablet, Take 50 mcg by mouth daily before breakfast.    NOVOLOG  MIX 70/30 FLEXPEN (70-30) 100 UNIT/ML FlexPen, Inject 15-20 Units into the skin daily with breakfast. Take 15 units in the morning and 20 units in the eveing   tirzepatide (MOUNJARO) 7.5 MG/0.5ML Pen, Inject 7.5 mg into the skin once a week.  Current Outpatient Medications (Cardiovascular):    diltiazem  (CARDIZEM  CD) 360 MG 24 hr capsule, Take 1 capsule (360 mg total) by mouth daily.   furosemide  (LASIX ) 40 MG tablet, Take 1 tablet (40 mg total) by mouth daily as needed for fluid or edema.   metoprolol  succinate (TOPROL -XL) 50 MG 24 hr tablet, TAKE 1.5 TABLETS (75 MG TOTAL) BY MOUTH AT BEDTIME. TAKE WITH OR IMMEDIATELY FOLLOWING A MEAL.   nitroGLYCERIN  (NITROSTAT ) 0.4 MG SL tablet, Place 0.4 mg under the tongue every 5 (five)  minutes as needed for chest pain.   rosuvastatin  (CRESTOR ) 20 MG tablet, Take 20 mg by mouth daily.   spironolactone  (ALDACTONE ) 25 MG tablet, TAKE 1 TABLET (25 MG TOTAL) BY MOUTH DAILY.   Current Outpatient Medications (Analgesics):    acetaminophen  (TYLENOL ) 650 MG CR tablet, Take 1,300 mg by mouth every 8 (eight) hours as needed for pain (headache).   traMADol  (ULTRAM ) 50 MG tablet, Take 1 tablet (50 mg total) by mouth every 6 (six) hours as needed for moderate pain (pain score 4-6).  Current Outpatient Medications (Hematological):    ELIQUIS  5 MG TABS tablet, TAKE 1 TABLET BY MOUTH TWICE A DAY  Current Outpatient Medications (Other):    ACCU-CHEK GUIDE test strip,    Accu-Chek Softclix Lancets lancets, USE LANCETS 3 TIMES A DAY AS DIRECTED 30 DAYS   B-D UF III MINI PEN NEEDLES 31G X 5 MM MISC, 2 (two) times daily. as directed   Blood Glucose Monitoring Suppl (ACCU-CHEK GUIDE) w/Device KIT, See admin instructions.   co-enzyme Q-10 50 MG capsule, TAKE 1 CAPSULE BY MOUTH EVERY DAY   dicyclomine  (BENTYL ) 20 MG tablet, Take 1 tablet (20 mg total) by mouth 2 (two) times daily.   pantoprazole  (PROTONIX ) 20 MG tablet, TAKE 1 TABLET (20 MG TOTAL) BY MOUTH 2 (TWO) TIMES DAILY BEFORE A MEAL.   tiZANidine  (ZANAFLEX ) 4 MG tablet, Take 1 tablet (4 mg total) by mouth every 6 (six) hours as needed for muscle spasms.  Medical History:  Past Medical History:  Diagnosis Date   Allergy    Anemia    Aortic insufficiency    a. Mod by echo 05/2015.   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 Abnormal stress test -> LHc 06/2015 s/p DES to mCX and mLAD, residual D2 disease treated medially.   Diverticulitis    Erythema nodosum    Heart murmur    History of kidney stones     Hypertension    Insomnia    lung ca dx'd 10/1999   chemo/xrt comp 05/29/2000   Myalgia    Obesity    Pericardial effusion    a. 05/2015 Echo:  EF 55-60%, no rwma, Gr 1 DD, no effusion - but an effusion was seen on CT which was felt to be increased in size compared to 12/2014 - pericardium also thickened.    Pericarditis    a. Dx 05/2015.   Pneumothorax, spontaneous, tension 01/20/2012   October, 2010   PONV (postoperative nausea and vomiting)    Pulmonary fibrosis (HCC) 01/20/2012   Due to previous surgery and chest radiation for lung cancer   Type II diabetes mellitus (HCC)    Allergies:  Allergies  Allergen Reactions   Hydrocodone  Hives and Itching   Morphine  And Codeine  Other (See Comments)    GI PROBLEMS   Canagliflozin Diarrhea    Other Reaction(s): diarrhea   Metformin Hcl Diarrhea   Oxycodone  Hives and Itching   Rosuvastatin  Other (See Comments)    Pt reports causes lower extremity muscle aches/cramping  Other Reaction(s): muscle cramps   Lisinopril  Cough    Other Reaction(s): cough   Nsaids Other (See Comments)    GI PROBLEMS  Other Reaction(s): GI problems     Surgical History:  She  has a past surgical history that includes Lung cancer surgery (Left, 10/22/1999); Shoulder arthroscopy w/ rotator cuff repair (Right, ~ 2003); Inner ear surgery (Right, ~ 2009); Abdominal hysterectomy (10/21/2006); Colonoscopy; Knee Arthroplasty (Right, 10/21/1989); Carpal tunnel release (Bilateral, 1998-2005?); Pulmonary embolism surgery (10/21/2005); Esophagogastroduodenoscopy (egd) with esophageal dilation (10/21/2010); Knee arthroscopy (~ 2000); Foot surgery (Bilateral, 06/22/1979); Cardiac catheterization (N/A, 07/11/2015); Cardiac catheterization (07/11/2015); Appendectomy (1969?); Shoulder adhesion release (Right, ~ 2004); Lung surgery (Right, 10/21/2005); Cystoscopy w/ stone manipulation (10/22/2011); Carpal tunnel release (Left, 2009?); Coronary angioplasty; LEFT HEART CATH AND CORONARY  ANGIOGRAPHY (N/A, 12/18/2016); RIGHT AND LEFT HEART CATH (N/A, 06/25/2017); Colonoscopy with propofol  (N/A, 05/28/2018); LEFT HEART CATH AND CORONARY ANGIOGRAPHY (N/A, 02/01/2021); Upper gastrointestinal endoscopy (06/05/2021); Video bronchoscopy (N/A, 08/03/2021); Bronchial washings (08/03/2021); ATRIAL FIBRILLATION ABLATION (N/A, 02/27/2022); Cholecystectomy (N/A, 10/01/2022); Total knee arthroplasty (Left, 04/14/2023); and TRANSESOPHAGEAL ECHOCARDIOGRAM (N/A, 04/01/2024). Family History:  Her family history includes Diabetes in her father, maternal grandmother, mother, paternal grandmother, sister, sister, sister, and sister; Heart disease in her father; Hypertension in her sister; Kidney disease in her sister; Pancreatic cancer in her mother.  REVIEW OF SYSTEMS  : All other systems reviewed and negative except where noted in the History of Present Illness.  PHYSICAL EXAM: There were no vitals taken for this visit. Physical Exam          Alan JONELLE Coombs, PA-C 12:32 PM

## 2024-07-07 ENCOUNTER — Other Ambulatory Visit (INDEPENDENT_AMBULATORY_CARE_PROVIDER_SITE_OTHER)

## 2024-07-07 ENCOUNTER — Ambulatory Visit: Admitting: Physician Assistant

## 2024-07-07 ENCOUNTER — Encounter: Payer: Self-pay | Admitting: Physician Assistant

## 2024-07-07 VITALS — BP 104/64 | HR 80 | Ht 67.5 in | Wt 213.0 lb

## 2024-07-07 DIAGNOSIS — N183 Chronic kidney disease, stage 3 unspecified: Secondary | ICD-10-CM

## 2024-07-07 DIAGNOSIS — I351 Nonrheumatic aortic (valve) insufficiency: Secondary | ICD-10-CM

## 2024-07-07 DIAGNOSIS — R1084 Generalized abdominal pain: Secondary | ICD-10-CM

## 2024-07-07 DIAGNOSIS — I5032 Chronic diastolic (congestive) heart failure: Secondary | ICD-10-CM

## 2024-07-07 DIAGNOSIS — D649 Anemia, unspecified: Secondary | ICD-10-CM | POA: Diagnosis not present

## 2024-07-07 DIAGNOSIS — I370 Nonrheumatic pulmonary valve stenosis: Secondary | ICD-10-CM | POA: Diagnosis not present

## 2024-07-07 DIAGNOSIS — R109 Unspecified abdominal pain: Secondary | ICD-10-CM

## 2024-07-07 DIAGNOSIS — K5904 Chronic idiopathic constipation: Secondary | ICD-10-CM | POA: Diagnosis not present

## 2024-07-07 DIAGNOSIS — K5750 Diverticulosis of both small and large intestine without perforation or abscess without bleeding: Secondary | ICD-10-CM

## 2024-07-07 DIAGNOSIS — Z794 Long term (current) use of insulin: Secondary | ICD-10-CM

## 2024-07-07 DIAGNOSIS — E1122 Type 2 diabetes mellitus with diabetic chronic kidney disease: Secondary | ICD-10-CM

## 2024-07-07 DIAGNOSIS — K219 Gastro-esophageal reflux disease without esophagitis: Secondary | ICD-10-CM

## 2024-07-07 DIAGNOSIS — R1314 Dysphagia, pharyngoesophageal phase: Secondary | ICD-10-CM

## 2024-07-07 DIAGNOSIS — I480 Paroxysmal atrial fibrillation: Secondary | ICD-10-CM

## 2024-07-07 DIAGNOSIS — R1310 Dysphagia, unspecified: Secondary | ICD-10-CM

## 2024-07-07 DIAGNOSIS — E11641 Type 2 diabetes mellitus with hypoglycemia with coma: Secondary | ICD-10-CM | POA: Diagnosis not present

## 2024-07-07 LAB — CBC WITH DIFFERENTIAL/PLATELET
Basophils Absolute: 0 K/uL (ref 0.0–0.1)
Basophils Relative: 0.6 % (ref 0.0–3.0)
Eosinophils Absolute: 0.2 K/uL (ref 0.0–0.7)
Eosinophils Relative: 3.9 % (ref 0.0–5.0)
HCT: 36.4 % (ref 36.0–46.0)
Hemoglobin: 12.1 g/dL (ref 12.0–15.0)
Lymphocytes Relative: 29.5 % (ref 12.0–46.0)
Lymphs Abs: 1.6 K/uL (ref 0.7–4.0)
MCHC: 33.2 g/dL (ref 30.0–36.0)
MCV: 94.3 fl (ref 78.0–100.0)
Monocytes Absolute: 0.5 K/uL (ref 0.1–1.0)
Monocytes Relative: 8.7 % (ref 3.0–12.0)
Neutro Abs: 3.2 K/uL (ref 1.4–7.7)
Neutrophils Relative %: 57.3 % (ref 43.0–77.0)
Platelets: 229 K/uL (ref 150.0–400.0)
RBC: 3.86 Mil/uL — ABNORMAL LOW (ref 3.87–5.11)
RDW: 12.9 % (ref 11.5–15.5)
WBC: 5.5 K/uL (ref 4.0–10.5)

## 2024-07-07 LAB — COMPREHENSIVE METABOLIC PANEL WITH GFR
ALT: 27 U/L (ref 0–35)
AST: 24 U/L (ref 0–37)
Albumin: 4.1 g/dL (ref 3.5–5.2)
Alkaline Phosphatase: 100 U/L (ref 39–117)
BUN: 24 mg/dL — ABNORMAL HIGH (ref 6–23)
CO2: 32 meq/L (ref 19–32)
Calcium: 9.4 mg/dL (ref 8.4–10.5)
Chloride: 103 meq/L (ref 96–112)
Creatinine, Ser: 0.82 mg/dL (ref 0.40–1.20)
GFR: 74.78 mL/min (ref >60.00)
Glucose, Bld: 100 mg/dL — ABNORMAL HIGH (ref 70–99)
Potassium: 4.6 meq/L (ref 3.5–5.1)
Sodium: 141 meq/L (ref 135–145)
Total Bilirubin: 0.4 mg/dL (ref 0.2–1.2)
Total Protein: 7.2 g/dL (ref 6.0–8.3)

## 2024-07-07 LAB — IBC + FERRITIN
Ferritin: 148.1 ng/mL (ref 10.0–291.0)
Iron: 82 ug/dL (ref 42–145)
Saturation Ratios: 26 % (ref 20.0–50.0)
TIBC: 315 ug/dL (ref 250.0–450.0)
Transferrin: 225 mg/dL (ref 212.0–360.0)

## 2024-07-07 LAB — VITAMIN B12: Vitamin B-12: 276 pg/mL (ref 211–911)

## 2024-07-07 LAB — SEDIMENTATION RATE: Sed Rate: 28 mm/h (ref 0–30)

## 2024-07-07 MED ORDER — LINACLOTIDE 145 MCG PO CAPS: 145.0000 ug | ORAL_CAPSULE | Freq: Every day | ORAL | Status: DC

## 2024-07-07 NOTE — Patient Instructions (Addendum)
 Your provider has requested that you go to the basement level for lab work before leaving today. Press B on the elevator. The lab is located at the first door on the left as you exit the elevator.  BOWEL PURGE:   Purchase 1 (one) 119 GRAM bottle of Miralax  Purchase 1 (one) 32 ounce bottle of Gatorade   STEPS:   Mix the entire bottle of Miralax  in the 32 ounces of room temperature Gatorade and stir to dissolve completely. Drink the Miralax  solution you have prepared. You will drink this mixture over the next 2-3 hours.  You should expect results within 1 to 6 hours after completing this bowel purge.   Purchase a bottle of Miralax  over the counter as well as a box of 5 mg dulcolax tablets. Take 4 dulcolax tablets. Wait 1 hour. You will then drink 6-8 capfuls of Miralax  mixed in an adequate amount of water /juice/gatorade (you may choose which of these liquids to drink) over the next 2-3 hours. You should expect results within 1 to 6 hours after completing the bowel purge. Go to the er if you have severe AB pain, can not pass gas or stool in over 12 hours, can not hold down any food.   We have given you samples of the following medication to take: Linzess  145 mcg: Take 30 minutes before a meal Linzess  145 mcg START AFTER YOUR BOWEL PURGE, CAN GO DOWN TO 72 MCG OR UP TO 290 MCG DEPENDING ON HOW YOU DO *IBS-C patients may begin to experience relief from belly pain and overall abdominal symptoms (pain, discomfort, and bloating) in about 1 week,  with symptoms typically improving over 12 weeks.  Take at least 30 minutes before the first meal of the day on an empty stomach You can have a loose stool if you eat a high-fat breakfast. Give it at least 7 days, may have more bowel movements during that time.   The diarrhea should go away and you should start having normal, complete, full bowel movements.  It may be helpful to start treatment when you can be near the comfort of your own bathroom,  such as a weekend.  After you are out we can send in a prescription if you did well, there is a prescription card  Gastroparesis Gastroparesis is a condition in which food takes longer than normal to empty from the stomach.  This condition is also known as delayed gastric emptying. It is usually a long-term (chronic) condition.  What are the signs or symptoms? Symptoms of this condition include: Feeling full after eating very little or a loss of appetite. Nausea, vomiting, or heartburn. Bloating of your abdomen. Inconsistent blood sugar (glucose) levels on blood tests. Unexplained weight loss. Acid from the stomach coming up into the esophagus (gastroesophageal reflux). Sudden tightening (spasm) of the stomach, which can be painful. Symptoms may come and go. Some people may not notice any symptoms.  What increases the risk? You are more likely to develop this condition if: You have certain disorders or diseases. These may include: An endocrine disorder. An eating disorder. Amyloidosis. Scleroderma. Parkinson's disease. Multiple sclerosis. Cancer or infection of the stomach or the vagus nerve. You have had surgery on your stomach or vagus nerve. You take certain medicines. You are female.  Things you can do: Please do small frequent meals like 4-6 meals a day.  Eat and drink liquids at separate times.  Avoid high fiber foods, cook your vegetables, avoid high fat food.  Suggest  spreading protein throughout the day (greek yogurt, glucerna, soft meat, milk, eggs) Choose soft foods that you can mash with a fork When you are more symptomatic, change to pureed foods foods and liquids.  Consider reading Living well with Gastroparesis by [facility] Check out this link to a diet online [email or web address].fr  Thank you for entrusting me with your care and for choosing [facility], Alan Coombs, P.A.-C   _______________________________________________________  If your blood pressure at your visit was 140/90 or greater, please contact your primary care physician to follow up on this.  _______________________________________________________  If you are age 47 or older, your body mass index should be between 23-30. Your Body mass index is 32.87 kg/m. If this is out of the aforementioned range listed, please consider follow up with your Primary Care Provider.  If you are age 64 or younger, your body mass index should be between 19-25. Your Body mass index is 32.87 kg/m. If this is out of the aformentioned range listed, please consider follow up with your Primary Care Provider.   ________________________________________________________  The [facility] providers would like to encourage you to use MYCHART to communicate with providers for non-urgent requests or questions.  Due to long hold times on the telephone, sending your provider a message by [facility] may be a faster and more efficient way to get a response.  Please allow 48 business hours for a response.  Please remember that this is for non-urgent requests.  _______________________________________________________  [facility] is using a team-based approach to care.  Your team is made up of your doctor and two to three APPS. Our APPS (Nurse Practitioners and Physician Assistants) work with your physician to ensure care continuity for you. They are fully qualified to address your health concerns and develop a treatment plan. They communicate directly with your gastroenterologist to care for you. Seeing the Advanced Practice Practitioners on your physician's team can help you by facilitating care more promptly, often allowing for earlier appointments, access to diagnostic testing, procedures, and other specialty referrals.

## 2024-07-08 ENCOUNTER — Ambulatory Visit: Payer: Self-pay | Admitting: Physician Assistant

## 2024-08-09 ENCOUNTER — Ambulatory Visit: Admitting: Physician Assistant

## 2024-08-25 DIAGNOSIS — I480 Paroxysmal atrial fibrillation: Secondary | ICD-10-CM | POA: Diagnosis not present

## 2024-08-25 DIAGNOSIS — E039 Hypothyroidism, unspecified: Secondary | ICD-10-CM | POA: Diagnosis not present

## 2024-08-25 DIAGNOSIS — I2510 Atherosclerotic heart disease of native coronary artery without angina pectoris: Secondary | ICD-10-CM | POA: Diagnosis not present

## 2024-08-25 DIAGNOSIS — I10 Essential (primary) hypertension: Secondary | ICD-10-CM | POA: Diagnosis not present

## 2024-08-25 DIAGNOSIS — D500 Iron deficiency anemia secondary to blood loss (chronic): Secondary | ICD-10-CM | POA: Diagnosis not present

## 2024-08-25 DIAGNOSIS — E782 Mixed hyperlipidemia: Secondary | ICD-10-CM | POA: Diagnosis not present

## 2024-08-25 DIAGNOSIS — E669 Obesity, unspecified: Secondary | ICD-10-CM | POA: Diagnosis not present

## 2024-08-25 DIAGNOSIS — E1165 Type 2 diabetes mellitus with hyperglycemia: Secondary | ICD-10-CM | POA: Diagnosis not present

## 2024-08-25 DIAGNOSIS — M170 Bilateral primary osteoarthritis of knee: Secondary | ICD-10-CM | POA: Diagnosis not present

## 2024-08-25 DIAGNOSIS — Z794 Long term (current) use of insulin: Secondary | ICD-10-CM | POA: Diagnosis not present

## 2024-08-25 DIAGNOSIS — Z0000 Encounter for general adult medical examination without abnormal findings: Secondary | ICD-10-CM | POA: Diagnosis not present

## 2024-08-25 DIAGNOSIS — Z1159 Encounter for screening for other viral diseases: Secondary | ICD-10-CM | POA: Diagnosis not present

## 2024-09-01 ENCOUNTER — Telehealth: Payer: Self-pay | Admitting: Internal Medicine

## 2024-09-01 NOTE — Telephone Encounter (Signed)
*  STAT* If patient is at the pharmacy, call can be transferred to refill team.   1. Which medications need to be refilled? (please list name of each medication and dose if known) Diltiazem  360mg    2. Would you like to learn more about the convenience, safety, & potential cost savings by using the [facility]?    3. Are you open to using the [facility] (Type [facility]. N/A).   4. Which pharmacy/location (including street and city if local pharmacy) is medication to be sent to? [facility] [address]   5. Do they need a 30 day or 90 day supply? 90 days

## 2024-09-02 MED ORDER — DILTIAZEM HCL ER COATED BEADS 360 MG PO CP24: 360.0000 mg | ORAL_CAPSULE | Freq: Every day | ORAL | 0 refills | Status: DC

## 2024-09-02 NOTE — Telephone Encounter (Signed)
[facility] to [facility]

## 2024-09-15 NOTE — Progress Notes (Signed)
 09/20/2024 [name] [id number] 1956/03/19  Referring provider: Vernon Velna SAUNDERS, MD Primary GI doctor: Dr. Federico (Dr. Teressa)  ASSESSMENT AND PLAN:  Lower AB pain x 1 year, worse since March 2019 colonoscopy showed pandiverticulosis otherwise negative Recall 2029 04/08/2024 CTAPW tics but no inflammation 04/23/2024 CT angio unremarkable 05/12/2024 KUB showed moderate stool burden 05/12/2024 bilateral hip x-ray osteoarthritis bilateral hips Multiple ER visits Bowel purge did not help, linzess  145 mcg per patient did not help DDX IBS-c from Mounjaro, abdominal wall pain -Some chills Check Cbc, ESR rule out inflammation, consider CT if elevated - get KUB, if still with stool burden- consider bowel purge with trulance - consider trial of cymbalta or SNRI for nerve pain/hypersensitivity - Initiate gastroparesis diet. - consider repeat colonoscopy would need cardiac clearance and possibly at the hospital versus MRI def pending results, will discuss with the doctor  Dysphagia/GERD S/p cholecystectomy Dec 2024 Index of symptoms: hoarseness, feeling of upper esophageal/orophangeal dysphagia with liquids, pills and solids for a year, denies GERD, no weight loss.  05/2021 EGD for dysphagia benign proximal esophageal stricture 22 cm dilated and a few small AVMs in the esophagus unchanged polypoid lesion stomach unchanged over 13 years 02/09/24 barium swallow showed dysmotility with retrograde propulsion of barium bolus into upper esophagus, areas of mild extrinsic compression at the level of aortic notch as well as posterior stomach  that corresponds with poor progression of barium bolus CT chest normal aorta 07/2023 Possibly to [facility] contributing to symptoms versus dysphagia aortic. States well controlled at this time, denies reflux, nausea, worsening swallow issues - add on pepcid - gastroparesis diet given  Moderate AI and mild pulmonary stenosis 04/01/2024 echo TEE without any  issues passing scope showed EF 60 to 65% normal RVSP F no thrombus small pericardial effusion mild MR aortic valve regurgitation moderate no aortic stenosis none holodiastolic flow reversal ascending aorta 06/25/2024 MR cardiac showed stable leaky aortic valve and mild pulmonary stenosis repeat 1 year  CAD status post stents Follows with Dr. Victory Sharps 2023 negative myoview  stress test  Atrial fibrillation On Eliquis   Congestive heart failure with preserved EF 01/26/24 echo EF 65-70%, severe aortic valve regurg with holodiastolic backflow in proximal descending aorta  Type 2 diabetes insulin -dependent On Mounjaro x last year Discussed GLP1 with the patient, mechanism of action. Gastroparesis diet given to the patient.  Patient should be instructed to hold this medications if dose falls within 7 days of endoscopic procedure, due to increased risk of retained gastric contents.  Screening colonoscopy 2019 showed pandiverticulosis otherwise negative Recall 2029  Anemia with normal iron, B12 deficiency 06/05/2024  HGB 11.7 MCV 96.8 Platelets 270 07/07/2024  HGB 12.1 MCV 94.3 Platelets 229.0 07/07/2024 Iron 82 Ferritin 148.1 B12 276 Recent Labs    03/12/24 1210 04/08/24 1843 04/22/24 2336 06/05/24 0042 07/07/24 1141  HGB 12.1 11.9* 10.7* 11.7* 12.1  Add on B12   Patient Care Team: Vernon Velna SAUNDERS, MD as PCP - General (Internal Medicine) Inocencio Soyla Lunger, MD as PCP - Electrophysiology (Cardiology) Santo Stanly LABOR, MD as PCP - Cardiology (Cardiology) Carlie Clark, MD Teressa Toribio SQUIBB, MD (Inactive) (Gastroenterology) Yvone Rush, MD (Orthopedic Surgery) Freddie Lynwood CHRISTELLA, MD (Hematology and Oncology) Brantley Nancyann SQUIBB, MD (Thoracic Surgery) Johney Malachi DASEN, MD (Radiation Oncology) Octavia Charleston, MD as Consulting Physician (Ophthalmology) Tommas Pears, MD as Consulting Physician (Endocrinology) Santo Stanly LABOR, MD as Consulting Physician  (Cardiology)  HISTORY OF PRESENT ILLNESS: 68 y.o. female with a past medical  history listed below presents for evaluation of AB pain.  I last saw the patient in the office 07/07/2024 for abdominal pain and anemia.  Discussed the use of AI scribe software for clinical note transcription with the patient, who gave verbal consent to proceed.  History of Present Illness   [name] is a 68 year old female who presents with chronic abdominal pain and difficulty swallowing.  She reports chronic abdominal pain and had a cholecystectomy in December of the previous year. The pain is primarily located in the lower abdomen, around the bikini line, and is described as cramp-like or spasmodic. It worsens with movement and is alleviated by lying still. She has tried various laxatives, including Miralax  and Dulcolax, without significant relief. Her stool has been yellow. She has a history of a hysterectomy in 2008-2009, during which a significant amount of scar tissue was noted. She is concerned that scar tissue might be contributing to her current symptoms. Additionally, she has a family history of pancreatic cancer, as her mother died from the disease.  She experiences difficulty swallowing, describing it as needing to 'work it down' when eating. No heartburn or nausea, and the swallowing issue has not worsened. No fevers or chills, although she sometimes feels cold and requires additional clothing to stay warm.  Her current medications include Mounjaro, and she has previously tried gabapentin , which caused ankle swelling. She uses tramadol  for knee pain following knee surgery. Her B12 levels were noted to be low in September, although she is not currently taking B12 supplements. No chest pain or shortness of breath, although she occasionally experiences brief pain on the right side of her chest.       She  reports that she quit smoking about 25 years ago. Her smoking use included cigarettes. She started  smoking about 30 years ago. She has a 1.3 pack-year smoking history. She has never used smokeless tobacco. She reports that she does not drink alcohol and does not use drugs.  RELEVANT GI HISTORY, IMAGING AND LABS: Results   LABS B12: Low (06/2024)  RADIOLOGY CT scan: Pancreas normal, no inflammation (03/2024) Angiography: Normal (04/2024) X-ray: Moderate fecal load in transverse colon (04/2024) MRI: Normal (06/2024)      CBC    Component Value Date/Time   WBC 5.5 07/07/2024 1141   RBC 3.86 (L) 07/07/2024 1141   HGB 12.1 07/07/2024 1141   HGB 12.1 03/12/2024 1210   HGB 12.1 01/09/2015 1109   HCT 36.4 07/07/2024 1141   HCT 37.2 03/12/2024 1210   HCT 36.9 01/09/2015 1109   PLT 229.0 07/07/2024 1141   PLT 298 03/12/2024 1210   MCV 94.3 07/07/2024 1141   MCV 97 03/12/2024 1210   MCV 93.4 01/09/2015 1109   MCH 31.4 06/05/2024 0042   MCHC 33.2 07/07/2024 1141   RDW 12.9 07/07/2024 1141   RDW 11.9 03/12/2024 1210   RDW 12.2 01/09/2015 1109   LYMPHSABS 1.6 07/07/2024 1141   LYMPHSABS 1.9 06/20/2017 1057   LYMPHSABS 1.8 01/09/2015 1109   MONOABS 0.5 07/07/2024 1141   MONOABS 0.4 01/09/2015 1109   EOSABS 0.2 07/07/2024 1141   EOSABS 0.3 06/20/2017 1057   BASOSABS 0.0 07/07/2024 1141   BASOSABS 0.0 06/20/2017 1057   BASOSABS 0.0 01/09/2015 1109   Recent Labs    03/12/24 1210 04/08/24 1843 04/22/24 2336 06/05/24 0042 07/07/24 1141  HGB 12.1 11.9* 10.7* 11.7* 12.1    CMP     Component Value Date/Time   NA 141 07/07/2024  1141   NA 144 03/12/2024 1210   NA 144 01/09/2015 1109   K 4.6 07/07/2024 1141   K 4.2 01/09/2015 1109   CL 103 07/07/2024 1141   CL 105 01/19/2013 1126   CO2 32 07/07/2024 1141   CO2 28 01/09/2015 1109   GLUCOSE 100 (H) 07/07/2024 1141   GLUCOSE 71 01/09/2015 1109   GLUCOSE 180 (H) 01/19/2013 1126   BUN 24 (H) 07/07/2024 1141   BUN 18 03/12/2024 1210   BUN 13.7 01/09/2015 1109   CREATININE 0.82 07/07/2024 1141   CREATININE 0.90 08/04/2015  1127   CREATININE 0.8 01/09/2015 1109   CALCIUM  9.4 07/07/2024 1141   CALCIUM  9.2 01/09/2015 1109   PROT 7.2 07/07/2024 1141   PROT 6.9 03/05/2021 1502   PROT 7.1 01/09/2015 1109   ALBUMIN 4.1 07/07/2024 1141   ALBUMIN 4.2 03/05/2021 1502   ALBUMIN 3.5 01/09/2015 1109   AST 24 07/07/2024 1141   AST 16 01/09/2015 1109   ALT 27 07/07/2024 1141   ALT 16 01/09/2015 1109   ALKPHOS 100 07/07/2024 1141   ALKPHOS 110 01/09/2015 1109   BILITOT 0.4 07/07/2024 1141   BILITOT 0.3 03/05/2021 1502   BILITOT 0.32 01/09/2015 1109   GFRNONAA >60 06/05/2024 0042   GFRNONAA 85 04/18/2015 1451   GFRAA 58.36 09/08/2020 0000   GFRAA >89 04/18/2015 1451      Latest Ref Rng & Units 07/07/2024   11:41 AM 04/22/2024   11:36 PM 04/08/2024    6:43 PM  Hepatic Function  Total Protein 6.0 - 8.3 g/dL 7.2  7.3  7.6   Albumin 3.5 - 5.2 g/dL 4.1  3.8  4.0   AST 0 - 37 U/L 24  19  17    ALT 0 - 35 U/L 27  12  11    Alk Phosphatase 39 - 117 U/L 100  101  105   Total Bilirubin 0.2 - 1.2 mg/dL 0.4  0.3  0.6       Current Medications:   Current Outpatient Medications (Endocrine & Metabolic):    levothyroxine (SYNTHROID, LEVOTHROID) 50 MCG tablet, Take 50 mcg by mouth daily before breakfast.    NOVOLOG  MIX 70/30 FLEXPEN (70-30) 100 UNIT/ML FlexPen, Inject 15-20 Units into the skin daily with breakfast. Take 15 units in the morning and 20 units in the eveing   tirzepatide (MOUNJARO) 7.5 MG/0.5ML Pen, Inject 7.5 mg into the skin once a week.  Current Outpatient Medications (Cardiovascular):    diltiazem  (CARDIZEM  CD) 360 MG 24 hr capsule, Take 1 capsule (360 mg total) by mouth daily.   furosemide  (LASIX ) 40 MG tablet, Take 1 tablet (40 mg total) by mouth daily as needed for fluid or edema.   metoprolol  succinate (TOPROL -XL) 50 MG 24 hr tablet, TAKE 1.5 TABLETS (75 MG TOTAL) BY MOUTH AT BEDTIME. TAKE WITH OR IMMEDIATELY FOLLOWING A MEAL.   nitroGLYCERIN  (NITROSTAT ) 0.4 MG SL tablet, Place 0.4 mg under the tongue  every 5 (five) minutes as needed for chest pain.   rosuvastatin  (CRESTOR ) 20 MG tablet, Take 20 mg by mouth daily.   spironolactone  (ALDACTONE ) 25 MG tablet, TAKE 1 TABLET (25 MG TOTAL) BY MOUTH DAILY.   Current Outpatient Medications (Analgesics):    acetaminophen  (TYLENOL ) 650 MG CR tablet, Take 1,300 mg by mouth every 8 (eight) hours as needed for pain (headache).   traMADol  (ULTRAM ) 50 MG tablet, Take 1 tablet (50 mg total) by mouth every 6 (six) hours as needed for moderate pain (pain score 4-6).  Current Outpatient Medications (Hematological):    ELIQUIS  5 MG TABS tablet, TAKE 1 TABLET BY MOUTH TWICE A DAY  Current Outpatient Medications (Other):    ACCU-CHEK GUIDE test strip,    Accu-Chek Softclix Lancets lancets, USE LANCETS 3 TIMES A DAY AS DIRECTED 30 DAYS   B-D UF III MINI PEN NEEDLES 31G X 5 MM MISC, 2 (two) times daily. as directed   Blood Glucose Monitoring Suppl (ACCU-CHEK GUIDE) w/Device KIT, See admin instructions.   linaclotide  (LINZESS ) 145 MCG CAPS capsule, Take 1 capsule (145 mcg total) by mouth daily before breakfast. LOT: [id number], EXP: 12-2024   co-enzyme Q-10 50 MG capsule, TAKE 1 CAPSULE BY MOUTH EVERY DAY (Patient not taking: Reported on 09/20/2024)   dicyclomine  (BENTYL ) 20 MG tablet, Take 20 mg by mouth as needed. (Patient not taking: Reported on 09/20/2024)  Medical History:  Past Medical History:  Diagnosis Date   Allergy    Anemia    Aortic insufficiency    a. Mod by echo 05/2015.   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 Abnormal stress test -> LHc 06/2015 s/p DES to mCX and mLAD, residual D2 disease treated medially.   Diverticulitis    Erythema nodosum    Heart murmur    History of kidney stones    Hypertension    Insomnia     lung ca dx'd 10/1999   chemo/xrt comp 05/29/2000   Myalgia    Obesity    Pericardial effusion    a. 05/2015 Echo: EF 55-60%, no rwma, Gr 1 DD, no effusion - but an effusion was seen on CT which was felt to be increased in size compared to 12/2014 - pericardium also thickened.    Pericarditis    a. Dx 05/2015.   Pneumothorax, spontaneous, tension 01/20/2012   October, 2010   PONV (postoperative nausea and vomiting)    Pulmonary fibrosis (HCC) 01/20/2012   Due to previous surgery and chest radiation for lung cancer   Type II diabetes mellitus (HCC)    Allergies:  Allergies  Allergen Reactions   Hydrocodone  Hives and Itching   Morphine  And Codeine  Other (See Comments)    GI PROBLEMS   Canagliflozin Diarrhea    Other Reaction(s): diarrhea   Gabapentin  Other (See Comments)    ankle swelling   Metformin Hcl Diarrhea   Oxycodone  Hives and Itching   Rosuvastatin  Other (See Comments)    Pt reports causes lower extremity muscle aches/cramping  Other Reaction(s): muscle cramps   Lisinopril  Cough    Other Reaction(s): cough   Nsaids Other (See Comments)    GI PROBLEMS  Other Reaction(s): GI problems     Surgical History:  She  has a past surgical history that includes Lung cancer surgery (Left, 10/22/1999); Shoulder arthroscopy w/ rotator cuff repair (Right, ~ 2003); Inner ear surgery (Right, ~ 2009); Abdominal hysterectomy (10/21/2006); Colonoscopy; Knee Arthroplasty (Right, 10/21/1989); Carpal tunnel release (Bilateral, 1998-2005?); Pulmonary embolism surgery (10/21/2005); Esophagogastroduodenoscopy (egd) with esophageal dilation (10/21/2010); Knee arthroscopy (~ 2000); Foot surgery (Bilateral, 06/22/1979); Cardiac catheterization (N/A, 07/11/2015); Cardiac catheterization (07/11/2015); Appendectomy (1969?); Shoulder adhesion release (Right, ~ 2004); Lung surgery (Right, 10/21/2005); Cystoscopy w/ stone manipulation (10/22/2011); Carpal tunnel release (Left, 2009?); Coronary angioplasty;  LEFT HEART CATH AND CORONARY ANGIOGRAPHY (  N/A, 12/18/2016); RIGHT AND LEFT HEART CATH (N/A, 06/25/2017); Colonoscopy with propofol  (N/A, 05/28/2018); LEFT HEART CATH AND CORONARY ANGIOGRAPHY (N/A, 02/01/2021); Upper gastrointestinal endoscopy (06/05/2021); Video bronchoscopy (N/A, 08/03/2021); Bronchial washings (08/03/2021); ATRIAL FIBRILLATION ABLATION (N/A, 02/27/2022); Cholecystectomy (N/A, 10/01/2022); Total knee arthroplasty (Left, 04/14/2023); and TRANSESOPHAGEAL ECHOCARDIOGRAM (N/A, 04/01/2024). Family History:  Her family history includes Diabetes in her father, maternal grandmother, mother, paternal grandmother, sister, sister, sister, and sister; Heart disease in her father; Hypertension in her sister; Kidney disease in her sister; Pancreatic cancer in her mother.  REVIEW OF SYSTEMS  : All other systems reviewed and negative except where noted in the History of Present Illness.  PHYSICAL EXAM: BP 136/70   Pulse 85   Ht 5' 7.5 (1.715 m)   Wt 211 lb 8 oz (95.9 kg)   BMI 32.64 kg/m  Physical Exam   GENERAL APPEARANCE: Well nourished, in no apparent distress. HEENT: No cervical lymphadenopathy, unremarkable thyroid , sclerae anicteric, conjunctiva pink. RESPIRATORY: Respiratory effort normal, breath sounds equal bilaterally without rales, rhonchi, or wheezing. CARDIO: Regular rate and rhythm with no murmurs, rubs, or gallops, peripheral pulses intact. ABDOMEN: Soft, non-distended, active bowel sounds in all four quadrants, tenderness to palpation, worse with movement, positive Carnett sign with abdominal muscle engagement, no rebound, no mass appreciated. RECTAL: Declines. MUSCULOSKELETAL: Full range of motion, normal gait, pain on leg lift, pain on resistance test, without edema. SKIN: Dry, intact without rashes or lesions. No jaundice. NEURO: Alert, oriented, no focal deficits. PSYCH: Cooperative, normal mood and affect.      Alan JONELLE Coombs, PA-C 11:44 AM

## 2024-09-20 ENCOUNTER — Ambulatory Visit: Admitting: Physician Assistant

## 2024-09-20 ENCOUNTER — Other Ambulatory Visit

## 2024-09-20 ENCOUNTER — Ambulatory Visit
Admission: RE | Admit: 2024-09-20 | Discharge: 2024-09-20 | Disposition: A | Source: Ambulatory Visit | Attending: Physician Assistant | Admitting: Physician Assistant

## 2024-09-20 ENCOUNTER — Encounter: Payer: Self-pay | Admitting: Physician Assistant

## 2024-09-20 VITALS — BP 136/70 | HR 85 | Ht 67.5 in | Wt 211.5 lb

## 2024-09-20 DIAGNOSIS — E1122 Type 2 diabetes mellitus with diabetic chronic kidney disease: Secondary | ICD-10-CM | POA: Diagnosis not present

## 2024-09-20 DIAGNOSIS — R1084 Generalized abdominal pain: Secondary | ICD-10-CM | POA: Diagnosis not present

## 2024-09-20 DIAGNOSIS — I5032 Chronic diastolic (congestive) heart failure: Secondary | ICD-10-CM

## 2024-09-20 DIAGNOSIS — K5900 Constipation, unspecified: Secondary | ICD-10-CM | POA: Diagnosis not present

## 2024-09-20 DIAGNOSIS — I480 Paroxysmal atrial fibrillation: Secondary | ICD-10-CM | POA: Diagnosis not present

## 2024-09-20 DIAGNOSIS — I351 Nonrheumatic aortic (valve) insufficiency: Secondary | ICD-10-CM | POA: Diagnosis not present

## 2024-09-20 DIAGNOSIS — Z794 Long term (current) use of insulin: Secondary | ICD-10-CM

## 2024-09-20 DIAGNOSIS — K5904 Chronic idiopathic constipation: Secondary | ICD-10-CM

## 2024-09-20 DIAGNOSIS — K219 Gastro-esophageal reflux disease without esophagitis: Secondary | ICD-10-CM | POA: Diagnosis not present

## 2024-09-20 DIAGNOSIS — R1314 Dysphagia, pharyngoesophageal phase: Secondary | ICD-10-CM

## 2024-09-20 DIAGNOSIS — R1032 Left lower quadrant pain: Secondary | ICD-10-CM | POA: Diagnosis not present

## 2024-09-20 DIAGNOSIS — N183 Chronic kidney disease, stage 3 unspecified: Secondary | ICD-10-CM

## 2024-09-20 DIAGNOSIS — Z860101 Personal history of adenomatous and serrated colon polyps: Secondary | ICD-10-CM | POA: Diagnosis not present

## 2024-09-20 LAB — CBC WITH DIFFERENTIAL/PLATELET
Basophils Absolute: 0 K/uL (ref 0.0–0.1)
Basophils Relative: 0.5 % (ref 0.0–3.0)
Eosinophils Absolute: 0.3 K/uL (ref 0.0–0.7)
Eosinophils Relative: 7.1 % — ABNORMAL HIGH (ref 0.0–5.0)
HCT: 36.2 % (ref 36.0–46.0)
Hemoglobin: 12.1 g/dL (ref 12.0–15.0)
Lymphocytes Relative: 29 % (ref 12.0–46.0)
Lymphs Abs: 1.3 K/uL (ref 0.7–4.0)
MCHC: 33.3 g/dL (ref 30.0–36.0)
MCV: 94.8 fl (ref 78.0–100.0)
Monocytes Absolute: 0.4 K/uL (ref 0.1–1.0)
Monocytes Relative: 9.2 % (ref 3.0–12.0)
Neutro Abs: 2.5 K/uL (ref 1.4–7.7)
Neutrophils Relative %: 54.2 % (ref 43.0–77.0)
Platelets: 280 K/uL (ref 150.0–400.0)
RBC: 3.82 Mil/uL — ABNORMAL LOW (ref 3.87–5.11)
RDW: 12.9 % (ref 11.5–15.5)
WBC: 4.6 K/uL (ref 4.0–10.5)

## 2024-09-20 LAB — COMPREHENSIVE METABOLIC PANEL WITH GFR
ALT: 10 U/L (ref 0–35)
AST: 14 U/L (ref 0–37)
Albumin: 4.1 g/dL (ref 3.5–5.2)
Alkaline Phosphatase: 86 U/L (ref 39–117)
BUN: 24 mg/dL — ABNORMAL HIGH (ref 6–23)
CO2: 35 meq/L — ABNORMAL HIGH (ref 19–32)
Calcium: 9.2 mg/dL (ref 8.4–10.5)
Chloride: 103 meq/L (ref 96–112)
Creatinine, Ser: 0.9 mg/dL (ref 0.40–1.20)
GFR: 66.78 mL/min (ref >60.00)
Glucose, Bld: 99 mg/dL (ref 70–99)
Potassium: 4.2 meq/L (ref 3.5–5.1)
Sodium: 143 meq/L (ref 135–145)
Total Bilirubin: 0.5 mg/dL (ref 0.2–1.2)
Total Protein: 7.2 g/dL (ref 6.0–8.3)

## 2024-09-20 LAB — SEDIMENTATION RATE: Sed Rate: 18 mm/h (ref 0–30)

## 2024-09-20 NOTE — Patient Instructions (Signed)
 Your provider has requested that you go to the basement level for lab work before leaving today. Press B on the elevator. The lab is located at the first door on the left as you exit the elevator.  Your provider has requested that you have an abdominal x ray before leaving today. Please go to the basement floor to our Radiology department for the test.    B12 is mainly in meat, so increase meat can help this but often people have a deficiency that they are just not absorbing it well with meat or pills, so get the sublingual/melt in your mouth one.   If the sublingual one dose not increase your level, you can discuss with PCP  GET SALON PAS PATCH FROM OVER THE COUNTER AND USE THIS 12 HOURS ON AND 12 HOURS OFF     Vitamin B12 Deficiency Vitamin B12 deficiency occurs when the body does not have enough vitamin B12, which is an important vitamin. The body needs this vitamin: To make red blood cells. To make DNA. This is the genetic material inside cells. To help the nerves work properly so they can carry messages from the brain to the body. Vitamin B12 deficiency can cause various health problems, such as a low red blood cell count (anemia) or nerve damage. What are the causes? This condition may be caused by: Not eating enough foods that contain vitamin B12. Not having enough stomach acid and digestive fluids to properly absorb vitamin B12 from the food that you eat. Certain digestive system diseases that make it hard to absorb vitamin B12. These diseases include Crohn's disease, chronic pancreatitis, and cystic fibrosis. A condition in which the body does not make enough of a protein (intrinsic factor), resulting in too few red blood cells (pernicious anemia). Having a surgery in which part of the stomach or small intestine is removed. Taking certain medicines that make it hard for the body to absorb vitamin B12. These medicines include: Heartburn medicines (antacids and proton pump  inhibitors). Certain antibiotic medicines. Some medicines that are used to treat diabetes, tuberculosis, gout, or high cholesterol. What increases the risk? The following factors may make you more likely to develop a B12 deficiency: Being older than age 20. Eating a vegetarian or vegan diet, especially while you are pregnant. Eating a poor diet while you are pregnant. Taking certain medicines. Having alcoholism. What are the signs or symptoms? In some cases, there are no symptoms of this condition. If the condition leads to anemia or nerve damage, various symptoms can occur, such as: Weakness. Fatigue. Loss of appetite. Weight loss. Numbness or tingling in your hands and feet. Redness and burning of the tongue. Confusion or memory problems. Depression. Sensory problems, such as color blindness, ringing in the ears, or loss of taste. Diarrhea or constipation. Trouble walking. If anemia is severe, symptoms can include: Shortness of breath. Dizziness. Rapid heart rate (tachycardia). How is this diagnosed? This condition may be diagnosed with a blood test to measure the level of vitamin B12 in your blood. You may also have other tests, including: A group of tests that measure certain characteristics of blood cells (complete blood count, CBC). A blood test to measure intrinsic factor. A procedure where a thin tube with a camera on the end is used to look into your stomach or intestines (endoscopy). Other tests may be needed to discover the cause of B12 deficiency. How is this treated? Treatment for this condition depends on the cause. This condition may be  treated by: Changing your eating and drinking habits, such as: Eating more foods that contain vitamin B12. Drinking less alcohol or no alcohol. Getting vitamin B12 injections. Taking vitamin B12 supplements. Your health care provider will tell you which dosage is best for you. Follow these instructions at home: Eating and  drinking  Eat lots of healthy foods that contain vitamin B12, including: Meats and poultry. This includes beef, pork, chicken, turkey, and organ meats, such as liver. Seafood. This includes clams, rainbow trout, salmon, tuna, and haddock. Eggs. Cereal and dairy products that are fortified. This means that vitamin B12 has been added to the food. Check the label on the package to see if the food is fortified. The items listed above may not be a complete list of recommended foods and beverages. Contact a dietitian for more information. General instructions Get any injections that are prescribed by your health care provider. Take supplements only as told by your health care provider. Follow the directions carefully. Do not drink alcohol if your health care provider tells you not to. In some cases, you may only be asked to limit alcohol use. Keep all follow-up visits as told by your health care provider. This is important. Contact a health care provider if: Your symptoms come back. Get help right away if you: Develop shortness of breath. Have a rapid heart rate. Have chest pain. Become dizzy or lose consciousness. Summary Vitamin B12 deficiency occurs when the body does not have enough vitamin B12. The main causes of vitamin B12 deficiency include dietary deficiency, digestive diseases, pernicious anemia, and having a surgery in which part of the stomach or small intestine is removed. In some cases, there are no symptoms of this condition. If the condition leads to anemia or nerve damage, various symptoms can occur, such as weakness, shortness of breath, and numbness. Treatment may include getting vitamin B12 injections or taking vitamin B12 supplements. Eat lots of healthy foods that contain vitamin B12. This information is not intended to replace advice given to you by your health care provider. Make sure you discuss any questions you have with your health care provider. Document Revised:  03/26/2019 Document Reviewed: 06/16/2018 Elsevier Patient Education  2020 [employer].   You have been scheduled for a follow up appointment on Monday, 11-01-24 at 10:20am. Please arrive 10 minutes early for registration. If you need to reschedule or cancel this appointment please call [phone] as soon as possible. Thank you.  Thank you for entrusting me with your care and for choosing [facility], Alan Coombs, P.A.-C   _______________________________________________________  If your blood pressure at your visit was 140/90 or greater, please contact your primary care physician to follow up on this.  _______________________________________________________  If you are age 24 or older, your body mass index should be between 23-30. Your Body mass index is 32.64 kg/m. If this is out of the aforementioned range listed, please consider follow up with your Primary Care Provider.  If you are age 96 or younger, your body mass index should be between 19-25. Your Body mass index is 32.64 kg/m. If this is out of the aformentioned range listed, please consider follow up with your Primary Care Provider.   ________________________________________________________  The [facility] providers would like to encourage you to use MYCHART to communicate with providers for non-urgent requests or questions.  Due to long hold times on the telephone, sending your provider a message by [facility] may be a faster and more efficient way to get a response.  Please allow 48 business hours for a response.  Please remember that this is for non-urgent requests.  _______________________________________________________  [facility] is using a team-based approach to care.  Your team is made up of your doctor and two to three APPS. Our APPS (Nurse Practitioners and Physician Assistants) work with your physician to ensure care continuity for you. They are fully qualified to address your health  concerns and develop a treatment plan. They communicate directly with your gastroenterologist to care for you. Seeing the Advanced Practice Practitioners on your physician's team can help you by facilitating care more promptly, often allowing for earlier appointments, access to diagnostic testing, procedures, and other specialty referrals.

## 2024-09-21 ENCOUNTER — Ambulatory Visit: Payer: Self-pay | Admitting: Physician Assistant

## 2024-09-23 NOTE — Telephone Encounter (Signed)
 PT is calling to get imaging and lab results. Please advise.

## 2024-09-27 ENCOUNTER — Other Ambulatory Visit: Payer: Self-pay | Admitting: Cardiology

## 2024-09-28 ENCOUNTER — Other Ambulatory Visit: Payer: Self-pay | Admitting: Gastroenterology

## 2024-09-28 ENCOUNTER — Telehealth: Payer: Self-pay

## 2024-09-28 DIAGNOSIS — K5904 Chronic idiopathic constipation: Secondary | ICD-10-CM

## 2024-09-28 MED ORDER — TRULANCE 3 MG PO TABS: 1.0000 | ORAL_TABLET | Freq: Every day | ORAL | 1 refills | Status: DC

## 2024-09-28 MED ORDER — LUBIPROSTONE 8 MCG PO CAPS: 8.0000 ug | ORAL_CAPSULE | Freq: Two times a day (BID) | ORAL | 0 refills | Status: DC

## 2024-09-28 NOTE — Progress Notes (Signed)
 Trulance  not covered. Will send in amitiza 

## 2024-09-28 NOTE — Telephone Encounter (Signed)
 Pharmacy Patient Advocate Encounter  Received notification from [employer] that Prior Authorization for Trulance  3MG  tablets has been DENIED.  Full denial letter will be uploaded to the media tab. See denial reason below.  The unmet criteria are:  You must try or cannot use lubiprostone   PA #/Case ID/Reference #: [id number]

## 2024-09-28 NOTE — Telephone Encounter (Signed)
 Pharmacy Patient Advocate Encounter   Received notification from CoverMyMeds that prior authorization for Trulance  3MG  tablets is required/requested.   Insurance verification completed.   The patient is insured through [employer].   Per test claim: PA required; PA submitted to above mentioned insurance via Latent Key/confirmation #/EOC [id number] Status is pending

## 2024-10-16 ENCOUNTER — Emergency Department (HOSPITAL_BASED_OUTPATIENT_CLINIC_OR_DEPARTMENT_OTHER)
Admission: EM | Admit: 2024-10-16 | Discharge: 2024-10-17 | Disposition: A | Attending: Emergency Medicine | Admitting: Emergency Medicine

## 2024-10-16 ENCOUNTER — Encounter (HOSPITAL_BASED_OUTPATIENT_CLINIC_OR_DEPARTMENT_OTHER): Payer: Self-pay | Admitting: Emergency Medicine

## 2024-10-16 ENCOUNTER — Emergency Department (HOSPITAL_BASED_OUTPATIENT_CLINIC_OR_DEPARTMENT_OTHER)

## 2024-10-16 ENCOUNTER — Other Ambulatory Visit: Payer: Self-pay

## 2024-10-16 DIAGNOSIS — R1032 Left lower quadrant pain: Secondary | ICD-10-CM | POA: Insufficient documentation

## 2024-10-16 DIAGNOSIS — Z7901 Long term (current) use of anticoagulants: Secondary | ICD-10-CM | POA: Insufficient documentation

## 2024-10-16 DIAGNOSIS — R109 Unspecified abdominal pain: Secondary | ICD-10-CM

## 2024-10-16 LAB — URINALYSIS, ROUTINE W REFLEX MICROSCOPIC
Bilirubin Urine: NEGATIVE
Glucose, UA: NEGATIVE mg/dL
Ketones, ur: NEGATIVE mg/dL
Nitrite: NEGATIVE
Protein, ur: NEGATIVE mg/dL
Specific Gravity, Urine: 1.025 (ref 1.005–1.030)
pH: 5.5 (ref 5.0–8.0)

## 2024-10-16 LAB — COMPREHENSIVE METABOLIC PANEL WITH GFR
ALT: 92 U/L — ABNORMAL HIGH (ref 0–44)
AST: 93 U/L — ABNORMAL HIGH (ref 15–41)
Albumin: 4.1 g/dL (ref 3.5–5.0)
Alkaline Phosphatase: 177 U/L — ABNORMAL HIGH (ref 38–126)
Anion gap: 8 (ref 5–15)
BUN: 14 mg/dL (ref 8–23)
CO2: 30 mmol/L (ref 22–32)
Calcium: 9.6 mg/dL (ref 8.9–10.3)
Chloride: 103 mmol/L (ref 98–111)
Creatinine, Ser: 0.8 mg/dL (ref 0.44–1.00)
GFR, Estimated: >60 mL/min
Glucose, Bld: 111 mg/dL — ABNORMAL HIGH (ref 70–99)
Potassium: 4.5 mmol/L (ref 3.5–5.1)
Sodium: 141 mmol/L (ref 135–145)
Total Bilirubin: 0.4 mg/dL (ref 0.0–1.2)
Total Protein: 7.4 g/dL (ref 6.5–8.1)

## 2024-10-16 LAB — LIPASE, BLOOD: Lipase: 35 U/L (ref 11–51)

## 2024-10-16 LAB — CBC
HCT: 33.9 % — ABNORMAL LOW (ref 36.0–46.0)
Hemoglobin: 11.3 g/dL — ABNORMAL LOW (ref 12.0–15.0)
MCH: 31.5 pg (ref 26.0–34.0)
MCHC: 33.3 g/dL (ref 30.0–36.0)
MCV: 94.4 fL (ref 80.0–100.0)
Platelets: 257 K/uL (ref 150–400)
RBC: 3.59 MIL/uL — ABNORMAL LOW (ref 3.87–5.11)
RDW: 11.6 % (ref 11.5–15.5)
WBC: 6.3 K/uL (ref 4.0–10.5)
nRBC: 0 % (ref 0.0–0.2)

## 2024-10-16 MED ORDER — ACETAMINOPHEN 500 MG PO TABS
1000.0000 mg | ORAL_TABLET | Freq: Once | ORAL | Status: AC
  Administered 2024-10-16: 1000 mg via ORAL
  Filled 2024-10-16: qty 2

## 2024-10-16 MED ORDER — IOHEXOL 300 MG/ML  SOLN
100.0000 mL | Freq: Once | INTRAMUSCULAR | Status: AC | PRN
  Administered 2024-10-16: 100 mL via INTRAVENOUS

## 2024-10-16 NOTE — ED Triage Notes (Signed)
 Pelvic/ suprapubic pain Started last night Recent car travel worse after Reports similar in july

## 2024-10-16 NOTE — ED Provider Notes (Signed)
 " [place] EMERGENCY DEPARTMENT AT [facility] Provider Note   CSN: [id number] Arrival date & time: 10/16/24  8040     Patient presents with: Abdominal Pain   [name] is a 68 y.o. female.   This is a 68 year old female who is here today for left lower quadrant suprapubic pain.  Patient reports that she had a similar episode in July, came to the emergency department without etiology discovered, followed up with her GI doctor, and orthopedics.  They thought that she likely had arthritis in her hips.  She denies any fever, chills, vomiting or diarrhea.   Abdominal Pain      Prior to Admission medications  Medication Sig Start Date End Date Taking? Authorizing Provider  ACCU-CHEK GUIDE test strip  05/23/22   [provider]  Accu-Chek Softclix Lancets lancets USE LANCETS 3 TIMES A DAY AS DIRECTED 30 DAYS 11/12/19   [provider]  acetaminophen  (TYLENOL ) 650 MG CR tablet Take 1,300 mg by mouth every 8 (eight) hours as needed for pain (headache).    [provider]  B-D UF III MINI PEN NEEDLES 31G X 5 MM MISC 2 (two) times daily. as directed 10/31/18   [provider]  Blood Glucose Monitoring Suppl (ACCU-CHEK GUIDE) w/Device KIT See admin instructions. 11/12/19   [provider]  co-enzyme Q-10 50 MG capsule TAKE 1 CAPSULE BY MOUTH EVERY DAY Patient not taking: Reported on 09/20/2024 11/30/19   Maranda Leim DEL, MD  dicyclomine  (BENTYL ) 20 MG tablet Take 20 mg by mouth as needed. Patient not taking: Reported on 09/20/2024    [provider]  diltiazem  (CARDIZEM  CD) 360 MG 24 hr capsule TAKE 1 CAPSULE BY MOUTH EVERY DAY 09/30/24   Camnitz, Soyla Lunger, MD  ELIQUIS  5 MG TABS tablet TAKE 1 TABLET BY MOUTH TWICE A DAY 12/08/23   Camnitz, Soyla Lunger, MD  furosemide  (LASIX ) 40 MG tablet Take 1 tablet (40 mg total) by mouth daily as needed for fluid or edema. 03/15/21   Claudene Victory ORN, MD  levothyroxine (SYNTHROID, LEVOTHROID) 50  MCG tablet Take 50 mcg by mouth daily before breakfast.  05/14/17   [provider]  lubiprostone  (AMITIZA ) 8 MCG capsule Take 1 capsule (8 mcg total) by mouth 2 (two) times daily with a meal. 09/28/24   May, Deanna J, NP  metoprolol  succinate (TOPROL -XL) 50 MG 24 hr tablet TAKE 1.5 TABLETS (75 MG TOTAL) BY MOUTH AT BEDTIME. TAKE WITH OR IMMEDIATELY FOLLOWING A MEAL. 02/27/24   Camnitz, Soyla Lunger, MD  nitroGLYCERIN  (NITROSTAT ) 0.4 MG SL tablet Place 0.4 mg under the tongue every 5 (five) minutes as needed for chest pain.    [provider]  NOVOLOG  MIX 70/30 FLEXPEN (70-30) 100 UNIT/ML FlexPen Inject 15-20 Units into the skin daily with breakfast. Take 15 units in the morning and 20 units in the eveing 04/22/22   [provider]  rosuvastatin  (CRESTOR ) 20 MG tablet Take 20 mg by mouth daily. 03/06/23   [provider]  spironolactone  (ALDACTONE ) 25 MG tablet TAKE 1 TABLET (25 MG TOTAL) BY MOUTH DAILY. 07/23/22   Lesia Ozell Barter, PA-C  tirzepatide (MOUNJARO) 7.5 MG/0.5ML Pen Inject 7.5 mg into the skin once a week. 09/29/23   [provider]  traMADol  (ULTRAM ) 50 MG tablet Take 1 tablet (50 mg total) by mouth every 6 (six) hours as needed for moderate pain (pain score 4-6). 06/06/24   Glendia Rocky SAILOR, PA-C    Allergies: Hydrocodone , Morphine   and codeine , Canagliflozin, Gabapentin , Metformin hcl, Oxycodone , Rosuvastatin , Lisinopril , and Nsaids    Review of Systems  Gastrointestinal:  Positive for abdominal pain.    Updated Vital Signs BP (!) 139/59 (BP Location: Right Arm)   Pulse 79   Temp 97.9 F (36.6 C) (Oral)   Resp 18   SpO2 96%   Physical Exam Vitals and nursing note reviewed.  Constitutional:      Appearance: She is not toxic-appearing.  HENT:     Head: Normocephalic.  Abdominal:     General: Abdomen is flat.     Palpations: Abdomen is soft.     Tenderness: There is abdominal tenderness in the left lower quadrant.     Hernia: No  hernia is present.  Skin:    General: Skin is warm.  Neurological:     Mental Status: She is alert.     Comments: Patient with positive straight leg test on the left.  She has no numbness or tingling in her bilateral lower extremities.  Intact plantar and dorsi flexion.  Patient able to stand and bear weight, however has not show over due to discomfort on the left side.     (all labs ordered are listed, but only abnormal results are displayed) Labs Reviewed  COMPREHENSIVE METABOLIC PANEL WITH GFR - Abnormal; Notable for the following components:      Result Value   Glucose, Bld 111 (*)    AST 93 (*)    ALT 92 (*)    Alkaline Phosphatase 177 (*)    All other components within normal limits  CBC - Abnormal; Notable for the following components:   RBC 3.59 (*)    Hemoglobin 11.3 (*)    HCT 33.9 (*)    All other components within normal limits  URINALYSIS, ROUTINE W REFLEX MICROSCOPIC - Abnormal; Notable for the following components:   Hgb urine dipstick SMALL (*)    Leukocytes,Ua TRACE (*)    Bacteria, UA RARE (*)    All other components within normal limits  LIPASE, BLOOD    EKG: None  Radiology: No results found.   Procedures   Medications Ordered in the ED  iohexol  (OMNIPAQUE ) 300 MG/ML solution 100 mL (100 mLs Intravenous Contrast Given 10/16/24 2226)  acetaminophen  (TYLENOL ) tablet 1,000 mg (1,000 mg Oral Given 10/16/24 2304)                                    Medical Decision Making 68 year old female who is here today with left lower quadrant abdominal pain.  Differential diagnoses include musculoskeletal pain, psoas syndrome, colitis diverticulitis, consider cystitis.  Plan-on exam, patient's area of pain seems to be in her left lower quadrant.could certainly be diverticulitis, colitis.  Symptoms appear to be more musculoskeletal in nature.  When I move the patient, have her stand or flex or extend her hip but she has significant discomfort.  Her symptoms seem  to be exacerbated by sitting in a car.  Her area of location appears to be on her pubic rami on the left side.  Consider psoas syndrome in this patient.  Will provide the patient with some Tylenol  as she has multiple allergies.  Will obtain CT imaging of the patient's abdomen and pelvis.  Patient will be signed out to Dr. Bari pending CT A/P.  Amount and/or Complexity of Data Reviewed Labs: ordered. Radiology: ordered.  Risk OTC drugs. Prescription drug management.  Final diagnoses:  Abdominal pain, unspecified abdominal location    ED Discharge Orders     None          Mannie Fairy DASEN, DO 10/16/24 2321  "

## 2024-10-17 MED ORDER — TRAMADOL HCL 50 MG PO TABS: 50.0000 mg | ORAL_TABLET | Freq: Four times a day (QID) | ORAL | 0 refills | Status: DC | PRN

## 2024-10-17 MED ORDER — TRAMADOL HCL 50 MG PO TABS
50.0000 mg | ORAL_TABLET | Freq: Once | ORAL | Status: AC
  Administered 2024-10-17: 50 mg via ORAL
  Filled 2024-10-17: qty 1

## 2024-10-17 NOTE — Discharge Instructions (Addendum)
 You are seen today for lower abdominal pain.  Your CT imaging is largely unrevealing.  You do have diverticulosis but no evidence of diverticulitis.  Follow-up closely with your primary doctor and GI doctor.  He will be given a short course of tramadol  for your pain.

## 2024-10-17 NOTE — ED Provider Notes (Signed)
 Patient signed out pending CT imaging.  CT imaging is largely unremarkable.  She has a stable pericardial effusion and diverticulosis without evidence of diverticulitis.  Shared this with the patient.  She has seen GI and other specialties for similar pain in the past.  Workup has been unremarkable thus far.  Will send home with a short course of tramadol  and have her follow-up with her primary doctor and specialist.   Bari Charmaine FALCON, MD 10/17/24 [phone]

## 2024-10-26 NOTE — Progress Notes (Signed)
 I agree with the assessment and plan as outlined by Ms. Craig.

## 2024-10-29 NOTE — Progress Notes (Unsigned)
 "    10/29/2024 [name] [id number] 18-Sep-1956  Referring provider: Vernon Velna SAUNDERS, MD Primary GI doctor: Dr. Federico (Dr. Teressa)  ASSESSMENT AND PLAN:  Lower AB pain x 1 year, worse since March 2019 colonoscopy showed pandiverticulosis otherwise negative Recall 2029 04/08/2024 CTAPW tics but no inflammation 04/23/2024 CT angio unremarkable 05/12/2024 KUB showed moderate stool burden 05/12/2024 bilateral hip x-ray osteoarthritis bilateral hips 09/20/2024 KUB moderate fecal retention 10/16/2024 CTAP W ER diverticulosis without infection, pericardial effusion mild hepatic steatosis no acute findings Multiple ER visits Bowel purge did not help, linzess  145 mcg per patient did not help DDX IBS-c from Mounjaro, abdominal wall pain -Some chills Check Cbc, ESR rule out inflammation, consider CT if elevated - get KUB, if still with stool burden- consider bowel purge with trulance  - consider trial of cymbalta or SNRI for nerve pain/hypersensitivity - Initiate gastroparesis diet.  Dysphagia/GERD S/p cholecystectomy Dec 2024 Index of symptoms: hoarseness, feeling of upper esophageal/orophangeal dysphagia with liquids, pills and solids for a year, denies GERD, no weight loss.  05/2021 EGD for dysphagia benign proximal esophageal stricture 22 cm dilated and a few small AVMs in the esophagus unchanged polypoid lesion stomach unchanged over 13 years 02/09/24 barium swallow showed dysmotility with retrograde propulsion of barium bolus into upper esophagus, areas of mild extrinsic compression at the level of aortic notch as well as posterior stomach  that corresponds with poor progression of barium bolus CT chest normal aorta 07/2023 Possibly to [facility] contributing to symptoms versus dysphagia aortic. States well controlled at this time, denies reflux, nausea, worsening swallow issues - add on pepcid - gastroparesis diet given  Moderate AI and mild pulmonary stenosis 04/01/2024 echo TEE  without any issues passing scope showed EF 60 to 65% normal RVSP F no thrombus small pericardial effusion mild MR aortic valve regurgitation moderate no aortic stenosis none holodiastolic flow reversal ascending aorta 06/25/2024 MR cardiac showed stable leaky aortic valve and mild pulmonary stenosis repeat 1 year  CAD status post stents Follows with Dr. Victory Sharps 2023 negative myoview  stress test  Atrial fibrillation On Eliquis   Congestive heart failure with preserved EF 01/26/24 echo EF 65-70%, severe aortic valve regurg with holodiastolic backflow in proximal descending aorta  Type 2 diabetes insulin -dependent On Mounjaro x last year Discussed GLP1 with the patient, mechanism of action. Gastroparesis diet given to the patient.  Patient should be instructed to hold this medications if dose falls within 7 days of endoscopic procedure, due to increased risk of retained gastric contents.  Screening colonoscopy 2019 showed pandiverticulosis otherwise negative Recall 2029  Anemia with normal iron, B12 deficiency 06/05/2024  HGB 11.7 MCV 96.8 Platelets 270 07/07/2024  HGB 12.1 MCV 94.3 Platelets 229.0 07/07/2024 Iron 82 Ferritin 148.1 B12 276 Recent Labs    03/12/24 1210 04/08/24 1843 04/22/24 2336 06/05/24 0042 07/07/24 1141 09/20/24 1123 10/16/24 2017  HGB 12.1 11.9* 10.7* 11.7* 12.1 12.1 11.3*  Add on B12   Patient Care Team: Vernon Velna SAUNDERS, MD as PCP - General (Internal Medicine) Inocencio Soyla Lunger, MD as PCP - Electrophysiology (Cardiology) Santo Stanly LABOR, MD as PCP - Cardiology (Cardiology) Carlie Clark, MD Teressa Toribio SQUIBB, MD (Inactive) (Gastroenterology) Yvone Rush, MD (Orthopedic Surgery) Freddie Lynwood CHRISTELLA, MD (Hematology and Oncology) Brantley Nancyann SQUIBB, MD (Thoracic Surgery) Johney Malachi DASEN, MD (Radiation Oncology) Octavia Charleston, MD as Consulting Physician (Ophthalmology) Tommas Pears, MD as Consulting Physician  (Endocrinology) Santo Stanly LABOR, MD as Consulting Physician (Cardiology)  HISTORY OF PRESENT ILLNESS: 69 y.o. female  with a past medical history listed below presents for evaluation of AB pain.  I last saw the patient in the office 09/20/2024 for abdominal pain and anemia.  Discussed the use of AI scribe software for clinical note transcription with the patient, who gave verbal consent to proceed.  History of Present Illness           She  reports that she quit smoking about 25 years ago. Her smoking use included cigarettes. She started smoking about 30 years ago. She has a 1.3 pack-year smoking history. She has never used smokeless tobacco. She reports that she does not drink alcohol and does not use drugs.  RELEVANT GI HISTORY, IMAGING AND LABS: Results          CBC    Component Value Date/Time   WBC 6.3 10/16/2024 2017   RBC 3.59 (L) 10/16/2024 2017   HGB 11.3 (L) 10/16/2024 2017   HGB 12.1 03/12/2024 1210   HGB 12.1 01/09/2015 1109   HCT 33.9 (L) 10/16/2024 2017   HCT 37.2 03/12/2024 1210   HCT 36.9 01/09/2015 1109   PLT 257 10/16/2024 2017   PLT 298 03/12/2024 1210   MCV 94.4 10/16/2024 2017   MCV 97 03/12/2024 1210   MCV 93.4 01/09/2015 1109   MCH 31.5 10/16/2024 2017   MCHC 33.3 10/16/2024 2017   RDW 11.6 10/16/2024 2017   RDW 11.9 03/12/2024 1210   RDW 12.2 01/09/2015 1109   LYMPHSABS 1.3 09/20/2024 1123   LYMPHSABS 1.9 06/20/2017 1057   LYMPHSABS 1.8 01/09/2015 1109   MONOABS 0.4 09/20/2024 1123   MONOABS 0.4 01/09/2015 1109   EOSABS 0.3 09/20/2024 1123   EOSABS 0.3 06/20/2017 1057   BASOSABS 0.0 09/20/2024 1123   BASOSABS 0.0 06/20/2017 1057   BASOSABS 0.0 01/09/2015 1109   Recent Labs    03/12/24 1210 04/08/24 1843 04/22/24 2336 06/05/24 0042 07/07/24 1141 09/20/24 1123 10/16/24 2017  HGB 12.1 11.9* 10.7* 11.7* 12.1 12.1 11.3*    CMP     Component Value Date/Time   NA 141 10/16/2024 2017   NA 144 03/12/2024 1210   NA 144  01/09/2015 1109   K 4.5 10/16/2024 2017   K 4.2 01/09/2015 1109   CL 103 10/16/2024 2017   CL 105 01/19/2013 1126   CO2 30 10/16/2024 2017   CO2 28 01/09/2015 1109   GLUCOSE 111 (H) 10/16/2024 2017   GLUCOSE 71 01/09/2015 1109   GLUCOSE 180 (H) 01/19/2013 1126   BUN 14 10/16/2024 2017   BUN 18 03/12/2024 1210   BUN 13.7 01/09/2015 1109   CREATININE 0.80 10/16/2024 2017   CREATININE 0.90 08/04/2015 1127   CREATININE 0.8 01/09/2015 1109   CALCIUM  9.6 10/16/2024 2017   CALCIUM  9.2 01/09/2015 1109   PROT 7.4 10/16/2024 2017   PROT 6.9 03/05/2021 1502   PROT 7.1 01/09/2015 1109   ALBUMIN 4.1 10/16/2024 2017   ALBUMIN 4.2 03/05/2021 1502   ALBUMIN 3.5 01/09/2015 1109   AST 93 (H) 10/16/2024 2017   AST 16 01/09/2015 1109   ALT 92 (H) 10/16/2024 2017   ALT 16 01/09/2015 1109   ALKPHOS 177 (H) 10/16/2024 2017   ALKPHOS 110 01/09/2015 1109   BILITOT 0.4 10/16/2024 2017   BILITOT 0.3 03/05/2021 1502   BILITOT 0.32 01/09/2015 1109   GFRNONAA >60 10/16/2024 2017   GFRNONAA 85 04/18/2015 1451   GFRAA 58.36 09/08/2020 0000   GFRAA >89 04/18/2015 1451      Latest Ref Rng & Units 10/16/2024  8:17 PM 09/20/2024   11:23 AM 07/07/2024   11:41 AM  Hepatic Function  Total Protein 6.5 - 8.1 g/dL 7.4  7.2  7.2   Albumin 3.5 - 5.0 g/dL 4.1  4.1  4.1   AST 15 - 41 U/L 93  14  24   ALT 0 - 44 U/L 92  10  27   Alk Phosphatase 38 - 126 U/L 177  86  100   Total Bilirubin 0.0 - 1.2 mg/dL 0.4  0.5  0.4       Current Medications:   Current Outpatient Medications (Endocrine & Metabolic):    levothyroxine (SYNTHROID, LEVOTHROID) 50 MCG tablet, Take 50 mcg by mouth daily before breakfast.    NOVOLOG  MIX 70/30 FLEXPEN (70-30) 100 UNIT/ML FlexPen, Inject 15-20 Units into the skin daily with breakfast. Take 15 units in the morning and 20 units in the eveing   tirzepatide (MOUNJARO) 7.5 MG/0.5ML Pen, Inject 7.5 mg into the skin once a week.  Current Outpatient Medications (Cardiovascular):     diltiazem  (CARDIZEM  CD) 360 MG 24 hr capsule, TAKE 1 CAPSULE BY MOUTH EVERY DAY   furosemide  (LASIX ) 40 MG tablet, Take 1 tablet (40 mg total) by mouth daily as needed for fluid or edema.   metoprolol  succinate (TOPROL -XL) 50 MG 24 hr tablet, TAKE 1.5 TABLETS (75 MG TOTAL) BY MOUTH AT BEDTIME. TAKE WITH OR IMMEDIATELY FOLLOWING A MEAL.   nitroGLYCERIN  (NITROSTAT ) 0.4 MG SL tablet, Place 0.4 mg under the tongue every 5 (five) minutes as needed for chest pain.   rosuvastatin  (CRESTOR ) 20 MG tablet, Take 20 mg by mouth daily.   spironolactone  (ALDACTONE ) 25 MG tablet, TAKE 1 TABLET (25 MG TOTAL) BY MOUTH DAILY.  Current Outpatient Medications (Analgesics):    acetaminophen  (TYLENOL ) 650 MG CR tablet, Take 1,300 mg by mouth every 8 (eight) hours as needed for pain (headache).   traMADol  (ULTRAM ) 50 MG tablet, Take 1 tablet (50 mg total) by mouth every 6 (six) hours as needed for moderate pain (pain score 4-6).   traMADol  (ULTRAM ) 50 MG tablet, Take 1 tablet (50 mg total) by mouth every 6 (six) hours as needed.  Current Outpatient Medications (Hematological):    ELIQUIS  5 MG TABS tablet, TAKE 1 TABLET BY MOUTH TWICE A DAY  Current Outpatient Medications (Other):    ACCU-CHEK GUIDE test strip,    Accu-Chek Softclix Lancets lancets, USE LANCETS 3 TIMES A DAY AS DIRECTED 30 DAYS   B-D UF III MINI PEN NEEDLES 31G X 5 MM MISC, 2 (two) times daily. as directed   Blood Glucose Monitoring Suppl (ACCU-CHEK GUIDE) w/Device KIT, See admin instructions.   co-enzyme Q-10 50 MG capsule, TAKE 1 CAPSULE BY MOUTH EVERY DAY (Patient not taking: Reported on 09/20/2024)   dicyclomine  (BENTYL ) 20 MG tablet, Take 20 mg by mouth as needed. (Patient not taking: Reported on 09/20/2024)   lubiprostone  (AMITIZA ) 8 MCG capsule, Take 1 capsule (8 mcg total) by mouth 2 (two) times daily with a meal.  Medical History:  Past Medical History:  Diagnosis Date   Allergy    Anemia    Aortic insufficiency    a. Mod by echo  05/2015.   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 Abnormal  stress test -> LHc 06/2015 s/p DES to mCX and mLAD, residual D2 disease treated medially.   Diverticulitis    Erythema nodosum    Heart murmur    History of kidney stones    Hypertension    Insomnia    lung ca dx'd 10/1999   chemo/xrt comp 05/29/2000   Myalgia    Obesity    Pericardial effusion    a. 05/2015 Echo: EF 55-60%, no rwma, Gr 1 DD, no effusion - but an effusion was seen on CT which was felt to be increased in size compared to 12/2014 - pericardium also thickened.    Pericarditis    a. Dx 05/2015.   Pneumothorax, spontaneous, tension 01/20/2012   October, 2010   PONV (postoperative nausea and vomiting)    Pulmonary fibrosis (HCC) 01/20/2012   Due to previous surgery and chest radiation for lung cancer   Type II diabetes mellitus (HCC)    Allergies:  Allergies  Allergen Reactions   Hydrocodone  Hives and Itching   Morphine  And Codeine  Other (See Comments)    GI PROBLEMS   Canagliflozin Diarrhea    Other Reaction(s): diarrhea   Gabapentin  Other (See Comments)    ankle swelling   Metformin Hcl Diarrhea   Oxycodone  Hives and Itching   Rosuvastatin  Other (See Comments)    Pt reports causes lower extremity muscle aches/cramping  Other Reaction(s): muscle cramps   Lisinopril  Cough    Other Reaction(s): cough   Nsaids Other (See Comments)    GI PROBLEMS  Other Reaction(s): GI problems     Surgical History:  She  has a past surgical history that includes Lung cancer surgery (Left, 10/22/1999); Shoulder arthroscopy w/ rotator cuff repair (Right, ~ 2003); Inner ear surgery (Right, ~ 2009); Abdominal hysterectomy (10/21/2006); Colonoscopy; Knee Arthroplasty (Right, 10/21/1989); Carpal tunnel  release (Bilateral, 1998-2005?); Pulmonary embolism surgery (10/21/2005); Esophagogastroduodenoscopy (egd) with esophageal dilation (10/21/2010); Knee arthroscopy (~ 2000); Foot surgery (Bilateral, 06/22/1979); Cardiac catheterization (N/A, 07/11/2015); Cardiac catheterization (07/11/2015); Appendectomy (1969?); Shoulder adhesion release (Right, ~ 2004); Lung surgery (Right, 10/21/2005); Cystoscopy w/ stone manipulation (10/22/2011); Carpal tunnel release (Left, 2009?); Coronary angioplasty; LEFT HEART CATH AND CORONARY ANGIOGRAPHY (N/A, 12/18/2016); RIGHT AND LEFT HEART CATH (N/A, 06/25/2017); Colonoscopy with propofol  (N/A, 05/28/2018); LEFT HEART CATH AND CORONARY ANGIOGRAPHY (N/A, 02/01/2021); Upper gastrointestinal endoscopy (06/05/2021); Video bronchoscopy (N/A, 08/03/2021); Bronchial washings (08/03/2021); ATRIAL FIBRILLATION ABLATION (N/A, 02/27/2022); Cholecystectomy (N/A, 10/01/2022); Total knee arthroplasty (Left, 04/14/2023); and TRANSESOPHAGEAL ECHOCARDIOGRAM (N/A, 04/01/2024). Family History:  Her family history includes Diabetes in her father, maternal grandmother, mother, paternal grandmother, sister, sister, sister, and sister; Heart disease in her father; Hypertension in her sister; Kidney disease in her sister; Pancreatic cancer in her mother.  REVIEW OF SYSTEMS  : All other systems reviewed and negative except where noted in the History of Present Illness.  PHYSICAL EXAM: There were no vitals taken for this visit. Physical Exam          Alan JONELLE Coombs, PA-C 8:09 AM   "

## 2024-11-01 ENCOUNTER — Encounter: Payer: Self-pay | Admitting: Physician Assistant

## 2024-11-01 ENCOUNTER — Ambulatory Visit: Admitting: Physician Assistant

## 2024-11-01 ENCOUNTER — Ambulatory Visit
Admission: RE | Admit: 2024-11-01 | Discharge: 2024-11-01 | Disposition: A | Source: Ambulatory Visit | Attending: Physician Assistant | Admitting: Physician Assistant

## 2024-11-01 VITALS — BP 140/72 | HR 98 | Ht 67.5 in | Wt 206.5 lb

## 2024-11-01 DIAGNOSIS — K5904 Chronic idiopathic constipation: Secondary | ICD-10-CM

## 2024-11-01 DIAGNOSIS — Z794 Long term (current) use of insulin: Secondary | ICD-10-CM | POA: Diagnosis not present

## 2024-11-01 DIAGNOSIS — E1122 Type 2 diabetes mellitus with diabetic chronic kidney disease: Secondary | ICD-10-CM

## 2024-11-01 DIAGNOSIS — I4891 Unspecified atrial fibrillation: Secondary | ICD-10-CM | POA: Diagnosis not present

## 2024-11-01 DIAGNOSIS — R1084 Generalized abdominal pain: Secondary | ICD-10-CM

## 2024-11-01 DIAGNOSIS — I088 Other rheumatic multiple valve diseases: Secondary | ICD-10-CM

## 2024-11-01 DIAGNOSIS — Z955 Presence of coronary angioplasty implant and graft: Secondary | ICD-10-CM

## 2024-11-01 DIAGNOSIS — I351 Nonrheumatic aortic (valve) insufficiency: Secondary | ICD-10-CM

## 2024-11-01 DIAGNOSIS — I480 Paroxysmal atrial fibrillation: Secondary | ICD-10-CM

## 2024-11-01 DIAGNOSIS — I2510 Atherosclerotic heart disease of native coronary artery without angina pectoris: Secondary | ICD-10-CM

## 2024-11-01 DIAGNOSIS — R1310 Dysphagia, unspecified: Secondary | ICD-10-CM | POA: Diagnosis not present

## 2024-11-01 DIAGNOSIS — R1030 Lower abdominal pain, unspecified: Secondary | ICD-10-CM

## 2024-11-01 DIAGNOSIS — K219 Gastro-esophageal reflux disease without esophagitis: Secondary | ICD-10-CM | POA: Diagnosis not present

## 2024-11-01 DIAGNOSIS — Z7901 Long term (current) use of anticoagulants: Secondary | ICD-10-CM | POA: Diagnosis not present

## 2024-11-01 MED ORDER — PREGABALIN 50 MG PO CAPS: 50.0000 mg | ORAL_CAPSULE | Freq: Every evening | ORAL | 0 refills | Status: AC | PRN

## 2024-11-01 NOTE — Patient Instructions (Addendum)
 Your provider has requested that you have an abdominal x ray before leaving today. Please go to the basement floor to our Radiology department for the test.  We are referring you to Pelvic Floor Therapy.  They will contact you directly to schedule an appointment.  It may take a week or more before you hear from them.  Please feel free to contact us  if you have not heard from them within 2 weeks and we will follow up on the referral.    Thank you for trusting me with your gastrointestinal care!   Alan Coombs, PA-C  _______________________________________________________  If your blood pressure at your visit was 140/90 or greater, please contact your primary care physician to follow up on this.  _______________________________________________________  If you are age 9 or older, your body mass index should be between 23-30. Your Body mass index is 31.87 kg/m. If this is out of the aforementioned range listed, please consider follow up with your Primary Care Provider.  If you are age 66 or younger, your body mass index should be between 19-25. Your Body mass index is 31.87 kg/m. If this is out of the aformentioned range listed, please consider follow up with your Primary Care Provider.   ________________________________________________________  The [facility] providers would like to encourage you to use MYCHART to communicate with providers for non-urgent requests or questions.  Due to long hold times on the telephone, sending your provider a message by [facility] may be a faster and more efficient way to get a response.  Please allow 48 business hours for a response.  Please remember that this is for non-urgent requests.  _______________________________________________________  [facility] is using a team-based approach to care.  Your team is made up of your doctor and two to three APPS. Our APPS (Nurse Practitioners and Physician Assistants) work with your physician to ensure  care continuity for you. They are fully qualified to address your health concerns and develop a treatment plan. They communicate directly with your gastroenterologist to care for you. Seeing the Advanced Practice Practitioners on your physician's team can help you by facilitating care more promptly, often allowing for earlier appointments, access to diagnostic testing, procedures, and other specialty referrals.      Here some information about pelvic floor dysfunction. This may be contributing to some of your symptoms. We could also refer to pelvic floor physical therapy.   Pelvic Floor Dysfunction, Female Pelvic floor dysfunction (PFD) is a condition that results when the group of muscles and connective tissues that support the organs in the pelvis (pelvic floor muscles) do not work well. These muscles and their connections form a sling that supports the colon and bladder. In women, they also support the uterus. PFD causes pelvic floor muscles to be too weak, too tight, or both. In PFD, muscle movements are not coordinated. This may cause bowel or bladder problems. It may also cause pain. What are the causes? This condition may be caused by an injury to the pelvic area or by a weakening of pelvic muscles. This often results from pregnancy and childbirth or other types of strain. In many cases, the exact cause is not known. What increases the risk? The following factors may make you more likely to develop this condition: Having chronic bladder tissue inflammation (interstitial cystitis). Being an older person. Being overweight. History of radiation treatment for cancer in the pelvic region. Previous pelvic surgery, such as removal of the uterus (hysterectomy). What are the signs or symptoms? Symptoms  of this condition vary and may include: Bladder symptoms, such as: Trouble starting urination and emptying the bladder. Frequent urinary tract infections. Leaking urine when coughing,  laughing, or exercising (stress incontinence). Having to pass urine urgently or frequently. Pain when passing urine. Bowel symptoms, such as: Constipation. Urgent or frequent bowel movements. Incomplete bowel movements. Painful bowel movements. Leaking stool or gas. Unexplained genital or rectal pain. Genital or rectal muscle spasms. Low back pain. Other symptoms may include: A heavy, full, or aching feeling in the vagina. A bulge that protrudes into the vagina. Pain during or after sex. How is this diagnosed? This condition may be diagnosed based on: Your symptoms and medical history. A physical exam. During the exam, your health care provider may check your pelvic muscles for tightness, spasm, pain, or weakness. This may include a rectal exam and a pelvic exam. In some cases, you may have diagnostic tests, such as: Electrical muscle function tests. Urine flow testing. X-ray tests of bowel function. Ultrasound of the pelvic organs. How is this treated? Treatment for this condition depends on the symptoms. Treatment options include: Physical therapy. This may include Kegel exercises to help relax or strengthen the pelvic floor muscles. Biofeedback. This type of therapy provides feedback on how tight your pelvic floor muscles are so that you can learn to control them. Internal or external massage therapy. A treatment that involves electrical stimulation of the pelvic floor muscles to help control pain (transcutaneous electrical nerve stimulation, or TENS). Sound wave therapy (ultrasound) to reduce muscle spasms. Medicines, such as: Muscle relaxants. Bladder control medicines. Surgery to reconstruct or support pelvic floor muscles may be an option if other treatments do not help. Follow these instructions at home: Activity Do your usual activities as told by your health care provider. Ask your health care provider if you should modify any activities. Do pelvic floor strengthening  or relaxing exercises at home as told by your physical therapist. Lifestyle Maintain a healthy weight. Eat foods that are high in fiber, such as beans, whole grains, and fresh fruits and vegetables. Limit foods that are high in fat and processed sugars, such as fried or sweet foods. Manage stress with relaxation techniques such as yoga or meditation. General instructions If you have problems with leakage: Use absorbable pads or wear padded underwear. Wash frequently with mild soap. Keep your genital and anal area as clean and dry as possible. Ask your health care provider if you should try a barrier cream to prevent skin irritation. Take warm baths to relieve pelvic muscle tension or spasms. Take over-the-counter and prescription medicines only as told by your health care provider. Keep all follow-up visits. How is this prevented? The cause of PFD is not always known, but there are a few things you can do to reduce the risk of developing this condition, including: Staying at a healthy weight. Getting regular exercise. Managing stress. Contact a health care provider if: Your symptoms are not improving with home care. You have signs or symptoms of PFD that get worse at home. You develop new signs or symptoms. You have signs of a urinary tract infection, such as: Fever. Chills. Increased urinary frequency. A burning feeling when urinating. You have not had a bowel movement in 3 days (constipation). Summary Pelvic floor dysfunction results when the muscles and connective tissues in your pelvic floor do not work well. These muscles and their connections form a sling that supports your colon and bladder. In women, they also support the uterus. PFD  may be caused by an injury to the pelvic area or by a weakening of pelvic muscles. PFD causes pelvic floor muscles to be too weak, too tight, or a combination of both. Symptoms may vary from person to person. In most cases, PFD can be treated  with physical therapies and medicines. Surgery may be an option if other treatments do not help. This information is not intended to replace advice given to you by your health care provider. Make sure you discuss any questions you have with your health care provider. Document Revised: 02/14/2021 Document Reviewed: 02/14/2021 Elsevier Patient Education  2022 [employer]   B12 is mainly in meat, so increase meat can help this but often people have a deficiency that they are just not absorbing it well with meat or pills, so get the sublingual/melt in your mouth one.   If the sublingual one dose not increase your level, we will discuss shots.   Vitamin B12 Deficiency Vitamin B12 deficiency occurs when the body does not have enough vitamin B12, which is an important vitamin. The body needs this vitamin: To make red blood cells. To make DNA. This is the genetic material inside cells. To help the nerves work properly so they can carry messages from the brain to the body. Vitamin B12 deficiency can cause various health problems, such as a low red blood cell count (anemia) or nerve damage. What are the causes? This condition may be caused by: Not eating enough foods that contain vitamin B12. Not having enough stomach acid and digestive fluids to properly absorb vitamin B12 from the food that you eat. Certain digestive system diseases that make it hard to absorb vitamin B12. These diseases include Crohn's disease, chronic pancreatitis, and cystic fibrosis. A condition in which the body does not make enough of a protein (intrinsic factor), resulting in too few red blood cells (pernicious anemia). Having a surgery in which part of the stomach or small intestine is removed. Taking certain medicines that make it hard for the body to absorb vitamin B12. These medicines include: Heartburn medicines (antacids and proton pump inhibitors). Certain antibiotic medicines. Some medicines that are used to treat  diabetes, tuberculosis, gout, or high cholesterol. What increases the risk? The following factors may make you more likely to develop a B12 deficiency: Being older than age 34. Eating a vegetarian or vegan diet, especially while you are pregnant. Eating a poor diet while you are pregnant. Taking certain medicines. Having alcoholism. What are the signs or symptoms? In some cases, there are no symptoms of this condition. If the condition leads to anemia or nerve damage, various symptoms can occur, such as: Weakness. Fatigue. Loss of appetite. Weight loss. Numbness or tingling in your hands and feet. Redness and burning of the tongue. Confusion or memory problems. Depression. Sensory problems, such as color blindness, ringing in the ears, or loss of taste. Diarrhea or constipation. Trouble walking. If anemia is severe, symptoms can include: Shortness of breath. Dizziness. Rapid heart rate (tachycardia). How is this diagnosed? This condition may be diagnosed with a blood test to measure the level of vitamin B12 in your blood. You may also have other tests, including: A group of tests that measure certain characteristics of blood cells (complete blood count, CBC). A blood test to measure intrinsic factor. A procedure where a thin tube with a camera on the end is used to look into your stomach or intestines (endoscopy). Other tests may be needed to discover the cause of B12  deficiency. How is this treated? Treatment for this condition depends on the cause. This condition may be treated by: Changing your eating and drinking habits, such as: Eating more foods that contain vitamin B12. Drinking less alcohol or no alcohol. Getting vitamin B12 injections. Taking vitamin B12 supplements. Your health care provider will tell you which dosage is best for you. Follow these instructions at home: Eating and drinking  Eat lots of healthy foods that contain vitamin B12, including: Meats and  poultry. This includes beef, pork, chicken, turkey, and organ meats, such as liver. Seafood. This includes clams, rainbow trout, salmon, tuna, and haddock. Eggs. Cereal and dairy products that are fortified. This means that vitamin B12 has been added to the food. Check the label on the package to see if the food is fortified. The items listed above may not be a complete list of recommended foods and beverages. Contact a dietitian for more information. General instructions Get any injections that are prescribed by your health care provider. Take supplements only as told by your health care provider. Follow the directions carefully. Do not drink alcohol if your health care provider tells you not to. In some cases, you may only be asked to limit alcohol use. Keep all follow-up visits as told by your health care provider. This is important. Contact a health care provider if: Your symptoms come back. Get help right away if you: Develop shortness of breath. Have a rapid heart rate. Have chest pain. Become dizzy or lose consciousness. Summary Vitamin B12 deficiency occurs when the body does not have enough vitamin B12. The main causes of vitamin B12 deficiency include dietary deficiency, digestive diseases, pernicious anemia, and having a surgery in which part of the stomach or small intestine is removed. In some cases, there are no symptoms of this condition. If the condition leads to anemia or nerve damage, various symptoms can occur, such as weakness, shortness of breath, and numbness. Treatment may include getting vitamin B12 injections or taking vitamin B12 supplements. Eat lots of healthy foods that contain vitamin B12. This information is not intended to replace advice given to you by your health care provider. Make sure you discuss any questions you have with your health care provider. Document Revised: 03/26/2019 Document Reviewed: 06/16/2018 Elsevier Patient Education  2020 [employer].   Understanding Your Weekly GLP-1 Injection  A helpful guide to managing common side effects  You are on a once-weekly injectable medication in the GLP-1 receptor agonist class. These medications can be very effective for blood sugar control, weight loss, and heart protection, fatty liver or OSA, but they can also come with some side effects that are important to understand. The good news is: most side effects can be managed with a few adjustments.  1. Gastroparesis-Like Symptoms These medications slow down your stomach to help you feel full longer -- great for weight loss and blood sugar control, but they can sometimes cause symptoms that feel like gastroparesis (slow stomach emptying). Symptoms may include: -Feeling full quickly when eating -Nausea or vomiting -Bloating or abdominal discomfort -Worsening heartburn or reflux -Acid regurgitation -Stomach spasms or tightness What you can do: ??? Eat small, frequent meals (4-6 per day) ?? Drink fluids between meals, not during ?? Avoid high-fiber foods (like raw veggies or whole grains); cook your veggies well ?? Spread protein throughout the day (try Greek yogurt, eggs, soft meats, Glucerna, milk) ?? Choose soft foods you can mash with a fork ?? Switch to pured foods or liquids  during flare-ups ?? Consider reading: Living Well with Gastroparesis by Camelia Bone ?? Downloadable Diet Guide: [facility] Gastroparesis Diet PDF  ?? Tip: Try following a gastroparesis-friendly diet on the day of your injection and the day after, when the medication's effect is strongest.  2. Constipation Since this medication slows down your digestive system, constipation is very common. Tips to help: ?? Drink plenty of water  ???? Stay active with regular exercise ?? Add fiber-rich but gentle foods like kiwi ?? Try a low-dose magnesium  supplement at night ?? Use MiraLAX  (half to one capful daily) if constipation becomes more frequent,  especially if your dose increases  If these strategies dont help, talk to your provider -- they may recommend or prescribe other treatments.  3. When to Call the Doctor or Go to the ER While rare, this medication can slightly increase your risk of serious conditions like: Pancreatitis (inflammation of the pancreas) Gallstones or gallbladder problems Watch for these signs and seek help if you experience: Severe abdominal pain (especially in the upper belly or that radiates to your back) Pain in the right upper side of your abdomen Nausea, vomiting, fever, or chills that dont go away ?? Call your provider or go to the ER if these occur.  4. Who Should NOT Take This Medication? This medication should be avoided if you have: A personal history of pancreatitis  A personal or family history of medullary thyroid  cancer A condition called Multiple Endocrine Neoplasia Syndrome Type 2 (MEN2)  Final Note If the side effects are too bothersome, remember: Most symptoms will go away if you stop the medication. But many people tolerate it well after the first few weeks, especially with the right strategies in place.

## 2024-11-01 NOTE — Progress Notes (Signed)
 I agree with the assessment and plan as outlined by Ms. Craig.

## 2024-11-08 ENCOUNTER — Ambulatory Visit: Payer: Self-pay | Admitting: Physician Assistant

## 2024-11-08 MED ORDER — TRULANCE 3 MG PO TABS: 3.0000 mg | ORAL_TABLET | Freq: Every day | ORAL | 0 refills | Status: AC

## 2024-11-25 ENCOUNTER — Ambulatory Visit: Admitting: Internal Medicine

## 2024-11-25 ENCOUNTER — Encounter: Payer: Self-pay | Admitting: Internal Medicine

## 2024-11-25 ENCOUNTER — Telehealth: Payer: Self-pay

## 2024-11-25 VITALS — BP 142/78 | HR 99 | Ht 67.5 in | Wt 212.1 lb

## 2024-11-25 DIAGNOSIS — K219 Gastro-esophageal reflux disease without esophagitis: Secondary | ICD-10-CM

## 2024-11-25 DIAGNOSIS — R102 Pelvic and perineal pain unspecified side: Secondary | ICD-10-CM

## 2024-11-25 DIAGNOSIS — Z7901 Long term (current) use of anticoagulants: Secondary | ICD-10-CM

## 2024-11-25 DIAGNOSIS — R1030 Lower abdominal pain, unspecified: Secondary | ICD-10-CM

## 2024-11-25 DIAGNOSIS — I4891 Unspecified atrial fibrillation: Secondary | ICD-10-CM

## 2024-11-25 DIAGNOSIS — K5904 Chronic idiopathic constipation: Secondary | ICD-10-CM

## 2024-11-25 DIAGNOSIS — R748 Abnormal levels of other serum enzymes: Secondary | ICD-10-CM

## 2024-11-25 DIAGNOSIS — I2510 Atherosclerotic heart disease of native coronary artery without angina pectoris: Secondary | ICD-10-CM | POA: Diagnosis not present

## 2024-11-25 DIAGNOSIS — R1084 Generalized abdominal pain: Secondary | ICD-10-CM

## 2024-11-25 DIAGNOSIS — I088 Other rheumatic multiple valve diseases: Secondary | ICD-10-CM | POA: Diagnosis not present

## 2024-11-25 DIAGNOSIS — R194 Change in bowel habit: Secondary | ICD-10-CM

## 2024-11-25 DIAGNOSIS — R1310 Dysphagia, unspecified: Secondary | ICD-10-CM | POA: Diagnosis not present

## 2024-11-25 MED ORDER — POLYETHYLENE GLYCOL 3350 17 G PO PACK: 17.0000 g | PACK | Freq: Two times a day (BID) | ORAL | Status: AC

## 2024-11-25 MED ORDER — NA SULFATE-K SULFATE-MG SULF 17.5-3.13-1.6 GM/177ML PO SOLN: 1.0000 | Freq: Once | ORAL | 0 refills | Status: DC

## 2024-11-25 NOTE — Progress Notes (Signed)
 "    11/25/2024 [name] [id number] [birth year]  Referring provider: Vernon Velna SAUNDERS, MD  ASSESSMENT AND PLAN:  Lower AB pain x 1 year, worse since May 2025 2019 colonoscopy showed pandiverticulosis otherwise negative Recall 2029 04/08/2024 CTAPW tics but no inflammation 04/23/2024 CT angio unremarkable 05/12/2024 KUB showed moderate stool burden 05/12/2024 bilateral hip x-ray osteoarthritis bilateral hips 09/20/2024 KUB moderate fecal retention 10/16/2024 CTAP W ER diverticulosis without infection, pericardial effusion mild hepatic steatosis no acute findings Multiple ER visits Has been on oxycodone , only thing that helps Linzess  and trulance  did not help Lyrica  did not help Month off mounjaro without help Positive Carnett, likely nerve related or musculoskeletal related.  Not clear to me this is GI related but will attempt to keep her constipation under better control. Patient states that the pain starts in her groin near her vagina and then travels up. Bowel purge helped a bit. Mobic  helps some. Will go up on her baseline constipation therapy and have her come in for a colonoscopy to rule out SCAD and IBD. Will also plan for MR pelvis to make sure we are not missing an alternative source of plan  - Increased Miralax  to BID - Stop the Lyrica  - Declined Cymbalta for now. Can re-consider in the future - Patient is scheduled for pelvic floor PT in 12/2024 - MR Pelvis - Colonoscopy [facility] with 2-day prep. Will have Eliquis  held 2 days beforehand. - Encouraged her to see her orthopedic surgeon again   Dysphagia/GERD S/p cholecystectomy Dec 2024 Index of symptoms: hoarseness, feeling of upper esophageal/orophangeal dysphagia with liquids, pills and solids for a year, denies GERD, no weight loss.  05/2021 EGD for dysphagia benign proximal esophageal stricture 22 cm dilated and a few small AVMs in the esophagus unchanged polypoid lesion stomach unchanged over 13 years 02/09/24 barium  swallow showed dysmotility with retrograde propulsion of barium bolus into upper esophagus, areas of mild extrinsic compression at the level of aortic notch as well as posterior stomach  that corresponds with poor progression of barium bolus CT chest normal aorta 07/2023 Likely related to prior radiation therapy leading to some proximal esophageal strictures, she has had balloon dilations in the past of a distal esophageal stricture Dysphagia is stable at this time - Cont pepcid - Previously given information on gastroparesis diet - Declined EGD for now  Elevated LFTs May be related to fatty liver Fibrosis 4 Score 2.49  - Trend LFTs. Will need further lab work up for this in the future - Could consider further fibrosis assessment with imaging in the future  Moderate AI and mild pulmonary stenosis 04/01/2024 echo TEE without any issues passing scope showed EF 60 to 65% normal RVSP F no thrombus small pericardial effusion mild MR aortic valve regurgitation moderate no aortic stenosis none holodiastolic flow reversal ascending aorta 06/25/2024 MR cardiac showed stable leaky aortic valve and mild pulmonary stenosis repeat 1 year  CAD status post stents Follows with Dr. Victory Sharps 2023 negative myoview  stress test  Atrial fibrillation On Eliquis   Congestive heart failure with preserved EF 01/26/24 echo EF 65-70%, severe aortic valve regurg with holodiastolic backflow in proximal descending aorta  Type 2 diabetes insulin -dependent On Mounjaro x last year Discussed GLP1 with the patient, mechanism of action. Gastroparesis diet given to the patient.  Patient should be instructed to hold this medications if dose falls within 7 days of endoscopic procedure, due to increased risk of retained gastric contents.  Screening colonoscopy 2019 showed pandiverticulosis otherwise negative -  Colonoscopy as above  Anemia with normal iron, B12 deficiency 06/05/2024  HGB 11.7 MCV 96.8 Platelets  270 07/07/2024  HGB 12.1 MCV 94.3 Platelets 229.0 07/07/2024 Iron 82 Ferritin 148.1 B12 276 Recent Labs    03/12/24 1210 04/08/24 1843 04/22/24 2336 06/05/24 0042 07/07/24 1141 09/20/24 1123 10/16/24 2017  HGB 12.1 11.9* 10.7* 11.7* 12.1 12.1 11.3*    Patient Care Team: Vernon Velna SAUNDERS, MD as PCP - General (Internal Medicine) Inocencio Soyla Lunger, MD as PCP - Electrophysiology (Cardiology) Santo Stanly LABOR, MD as PCP - Cardiology (Cardiology) Carlie Clark, MD Teressa Toribio SQUIBB, MD (Inactive) (Gastroenterology) Yvone Rush, MD (Orthopedic Surgery) Freddie Lynwood HERO, MD (Hematology and Oncology) Brantley Nancyann SQUIBB, MD (Thoracic Surgery) Johney Malachi DASEN, MD (Radiation Oncology) Octavia Charleston, MD as Consulting Physician (Ophthalmology) Tommas Pears, MD as Consulting Physician (Endocrinology) Santo Stanly LABOR, MD as Consulting Physician (Cardiology)  HISTORY OF PRESENT ILLNESS: 69 y.o. female with a past medical history listed below presents for evaluation of AB pain.  Interval History: Patient's ab pain has been present since 02/2024. When it affects her in the groin, she would be down for 3 days. She just has to lay down and try not to move for that period of time. She was referred for pelvic floor PT but isn't able to go until 12/2024. The pain starts out in her groin and then goes to the LLQ. The pain is not around her anus and is not affected by BMs. The pain is not associated with urination. She saw an orthopedic surgeon and he gave her Mobic . Denies dysuria. Denies blood in the urine. Lyrica  did not help. Mobic  does help a little bit. She has not done any physical therapy yet. The bowel purge that she did recently may have helped a little bit with the pain. Linzess  and Trulance  did not really help. His dysphagia is okay currently, she has been dilated in the past but her prior radiation therapy for lung cancer has led to residual esophageal strictures. She just  has to at small portions of food. She is still on Mounjaro. She stopped the Mounjaro for 3 weeks and did not notice a difference. She takes Miralax  once a day currently. She does have trouble drinking water .  RELEVANT GI HISTORY, IMAGING AND LABS:  CT A/p w/contrast 10/16/24: IMPRESSION: 1. No acute findings. 2. Severe descending and sigmoid colonic diverticulosis without superimposed acute inflammatory change, with scattered diverticula in the proximal colon. 3. Stable small pericardial effusion. 4. Mild hepatic steatosis. 5. Status post cholecystectomy, appendectomy, and hysterectomy . 6. raf score - Aortic atherosclerosis (ICD10-I70.0)  CBC    Component Value Date/Time   WBC 6.3 10/16/2024 2017   RBC 3.59 (L) 10/16/2024 2017   HGB 11.3 (L) 10/16/2024 2017   HGB 12.1 03/12/2024 1210   HGB 12.1 01/09/2015 1109   HCT 33.9 (L) 10/16/2024 2017   HCT 37.2 03/12/2024 1210   HCT 36.9 01/09/2015 1109   PLT 257 10/16/2024 2017   PLT 298 03/12/2024 1210   MCV 94.4 10/16/2024 2017   MCV 97 03/12/2024 1210   MCV 93.4 01/09/2015 1109   MCH 31.5 10/16/2024 2017   MCHC 33.3 10/16/2024 2017   RDW 11.6 10/16/2024 2017   RDW 11.9 03/12/2024 1210   RDW 12.2 01/09/2015 1109   LYMPHSABS 1.3 09/20/2024 1123   LYMPHSABS 1.9 06/20/2017 1057   LYMPHSABS 1.8 01/09/2015 1109   MONOABS 0.4 09/20/2024 1123   MONOABS 0.4 01/09/2015 1109   EOSABS 0.3 09/20/2024 1123  EOSABS 0.3 06/20/2017 1057   BASOSABS 0.0 09/20/2024 1123   BASOSABS 0.0 06/20/2017 1057   BASOSABS 0.0 01/09/2015 1109   Recent Labs    03/12/24 1210 04/08/24 1843 04/22/24 2336 06/05/24 0042 07/07/24 1141 09/20/24 1123 10/16/24 2017  HGB 12.1 11.9* 10.7* 11.7* 12.1 12.1 11.3*    CMP     Component Value Date/Time   NA 141 10/16/2024 2017   NA 144 03/12/2024 1210   NA 144 01/09/2015 1109   K 4.5 10/16/2024 2017   K 4.2 01/09/2015 1109   CL 103 10/16/2024 2017   CL 105 01/19/2013 1126   CO2 30 10/16/2024 2017    CO2 28 01/09/2015 1109   GLUCOSE 111 (H) 10/16/2024 2017   GLUCOSE 71 01/09/2015 1109   GLUCOSE 180 (H) 01/19/2013 1126   BUN 14 10/16/2024 2017   BUN 18 03/12/2024 1210   BUN 13.7 01/09/2015 1109   CREATININE 0.80 10/16/2024 2017   CREATININE 0.90 08/04/2015 1127   CREATININE 0.8 01/09/2015 1109   CALCIUM  9.6 10/16/2024 2017   CALCIUM  9.2 01/09/2015 1109   PROT 7.4 10/16/2024 2017   PROT 6.9 03/05/2021 1502   PROT 7.1 01/09/2015 1109   ALBUMIN 4.1 10/16/2024 2017   ALBUMIN 4.2 03/05/2021 1502   ALBUMIN 3.5 01/09/2015 1109   AST 93 (H) 10/16/2024 2017   AST 16 01/09/2015 1109   ALT 92 (H) 10/16/2024 2017   ALT 16 01/09/2015 1109   ALKPHOS 177 (H) 10/16/2024 2017   ALKPHOS 110 01/09/2015 1109   BILITOT 0.4 10/16/2024 2017   BILITOT 0.3 03/05/2021 1502   BILITOT 0.32 01/09/2015 1109   GFRNONAA >60 10/16/2024 2017   GFRNONAA 85 04/18/2015 1451   GFRAA 58.36 09/08/2020 0000   GFRAA >89 04/18/2015 1451      Latest Ref Rng & Units 10/16/2024    8:17 PM 09/20/2024   11:23 AM 07/07/2024   11:41 AM  Hepatic Function  Total Protein 6.5 - 8.1 g/dL 7.4  7.2  7.2   Albumin 3.5 - 5.0 g/dL 4.1  4.1  4.1   AST 15 - 41 U/L 93  14  24   ALT 0 - 44 U/L 92  10  27   Alk Phosphatase 38 - 126 U/L 177  86  100   Total Bilirubin 0.0 - 1.2 mg/dL 0.4  0.5  0.4       Current Medications:   Current Outpatient Medications (Endocrine & Metabolic):    Insulin  Lispro Prot & Lispro (HUMALOG  MIX 75/25 KWIKPEN) (75-25) 100 UNIT/ML Kwikpen, Subcutaneous   levothyroxine (SYNTHROID, LEVOTHROID) 50 MCG tablet, Take 50 mcg by mouth daily before breakfast.    NOVOLOG  MIX 70/30 FLEXPEN (70-30) 100 UNIT/ML FlexPen, Inject 15-20 Units into the skin daily with breakfast. Take 15 units in the morning and 20 units in the eveing   tirzepatide (MOUNJARO) 7.5 MG/0.5ML Pen, Inject 7.5 mg into the skin once a week.  Current Outpatient Medications (Cardiovascular):    diltiazem  (CARDIZEM  CD) 360 MG 24 hr capsule,  TAKE 1 CAPSULE BY MOUTH EVERY DAY   furosemide  (LASIX ) 40 MG tablet, Take 1 tablet (40 mg total) by mouth daily as needed for fluid or edema.   metoprolol  succinate (TOPROL -XL) 50 MG 24 hr tablet, TAKE 1.5 TABLETS (75 MG TOTAL) BY MOUTH AT BEDTIME. TAKE WITH OR IMMEDIATELY FOLLOWING A MEAL.   nitroGLYCERIN  (NITROSTAT ) 0.4 MG SL tablet, Place 0.4 mg under the tongue every 5 (five) minutes as needed for chest pain.  rosuvastatin  (CRESTOR ) 20 MG tablet, Take 20 mg by mouth daily.   spironolactone  (ALDACTONE ) 25 MG tablet, TAKE 1 TABLET (25 MG TOTAL) BY MOUTH DAILY.  Current Outpatient Medications (Analgesics):    acetaminophen  (TYLENOL ) 650 MG CR tablet, Take 1,300 mg by mouth every 8 (eight) hours as needed for pain (headache).   meloxicam  (MOBIC ) 15 MG tablet, Take 15 mg by mouth daily as needed.   oxycodone  (OXY-IR) 5 MG capsule, Take 5 mg by mouth every 4 (four) hours as needed.   traMADol  (ULTRAM ) 50 MG tablet, Take 1 tablet (50 mg total) by mouth every 6 (six) hours as needed for moderate pain (pain score 4-6).  Current Outpatient Medications (Hematological):    ELIQUIS  5 MG TABS tablet, TAKE 1 TABLET BY MOUTH TWICE A DAY  Current Outpatient Medications (Other):    ACCU-CHEK GUIDE test strip,    Accu-Chek Softclix Lancets lancets, USE LANCETS 3 TIMES A DAY AS DIRECTED 30 DAYS   B-D UF III MINI PEN NEEDLES 31G X 5 MM MISC, 2 (two) times daily. as directed   Blood Glucose Monitoring Suppl (ACCU-CHEK GUIDE) w/Device KIT, See admin instructions.   co-enzyme Q-10 50 MG capsule, TAKE 1 CAPSULE BY MOUTH EVERY DAY   dicyclomine  (BENTYL ) 20 MG tablet, Take 20 mg by mouth as needed.   pantoprazole  (PROTONIX ) 20 MG tablet, Take 20 mg by mouth 2 (two) times daily.   Plecanatide  (TRULANCE ) 3 MG TABS, Take 1 tablet (3 mg total) by mouth daily.   pregabalin  (LYRICA ) 50 MG capsule, Take 1 capsule (50 mg total) by mouth at bedtime as needed (AB pain). See information given   tiZANidine  (ZANAFLEX ) 4 MG  tablet, TAKE 1 TABLET BY MOUTH EVERY DAY AT BEDTIME AS NEEDED; Duration: 20  Medical History:  Past Medical History:  Diagnosis Date   Allergy    Anemia    Aortic insufficiency    a. Mod by echo 05/2015.   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 Abnormal stress test -> LHc 06/2015 s/p DES to mCX and mLAD, residual D2 disease treated medially.   Diverticulitis    Erythema nodosum    Heart murmur    History of kidney stones    Hypertension    Insomnia    lung ca dx'd 10/1999   chemo/xrt comp 05/29/2000   Myalgia    Obesity    Pericardial effusion    a. 05/2015 Echo: EF 55-60%, no rwma, Gr 1 DD, no effusion - but an effusion was seen on CT which was felt to be increased in size compared to 12/2014 - pericardium also thickened.    Pericarditis    a. Dx 05/2015.   Pneumothorax, spontaneous, tension 01/20/2012   October, 2010   PONV (postoperative nausea and vomiting)    Pulmonary fibrosis (HCC) 01/20/2012   Due to previous surgery and chest radiation for lung cancer   Type II diabetes mellitus (HCC)    Allergies:  Allergies  Allergen Reactions   Hydrocodone  Hives and Itching   Morphine  And Codeine  Other (See Comments)    GI PROBLEMS   Canagliflozin Diarrhea    Other Reaction(s): diarrhea   Gabapentin  Other (See Comments)    ankle swelling   Metformin Hcl Diarrhea  Oxycodone  Hives and Itching   Rosuvastatin  Other (See Comments)    Pt reports causes lower extremity muscle aches/cramping  Other Reaction(s): muscle cramps   Lisinopril  Cough    Other Reaction(s): cough   Nsaids Other (See Comments)    GI PROBLEMS  Other Reaction(s): GI problems     Surgical History:  She  has a past surgical history that includes Lung cancer surgery (Left,  10/22/1999); Shoulder arthroscopy w/ rotator cuff repair (Right, ~ 2003); Inner ear surgery (Right, ~ 2009); Abdominal hysterectomy (10/21/2006); Colonoscopy; Knee Arthroplasty (Right, 10/21/1989); Carpal tunnel release (Bilateral, 1998-2005?); Pulmonary embolism surgery (10/21/2005); Esophagogastroduodenoscopy (egd) with esophageal dilation (10/21/2010); Knee arthroscopy (~ 2000); Foot surgery (Bilateral, 06/22/1979); Cardiac catheterization (N/A, 07/11/2015); Cardiac catheterization (07/11/2015); Appendectomy (1969?); Shoulder adhesion release (Right, ~ 2004); Lung surgery (Right, 10/21/2005); Cystoscopy w/ stone manipulation (10/22/2011); Carpal tunnel release (Left, 2009?); Coronary angioplasty; LEFT HEART CATH AND CORONARY ANGIOGRAPHY (N/A, 12/18/2016); RIGHT AND LEFT HEART CATH (N/A, 06/25/2017); Colonoscopy with propofol  (N/A, 05/28/2018); LEFT HEART CATH AND CORONARY ANGIOGRAPHY (N/A, 02/01/2021); Upper gastrointestinal endoscopy (06/05/2021); Video bronchoscopy (N/A, 08/03/2021); Bronchial washings (08/03/2021); ATRIAL FIBRILLATION ABLATION (N/A, 02/27/2022); Cholecystectomy (N/A, 10/01/2022); Total knee arthroplasty (Left, 04/14/2023); and TRANSESOPHAGEAL ECHOCARDIOGRAM (N/A, 04/01/2024). Family History:  Her family history includes Diabetes in her father, maternal grandmother, mother, paternal grandmother, sister, sister, sister, and sister; Heart disease in her father; Hypertension in her sister; Kidney disease in her sister; Pancreatic cancer in her mother.   PHYSICAL EXAM: BP (!) 142/78   Pulse 99   Ht 5' 7.5 (1.715 m)   Wt 212 lb 2 oz (96.2 kg)   BMI 32.73 kg/m  Physical Exam   General: Well appearing CV: Regular rate Resp: No increased WOB Abd: Generalized tenderness throughout the abdomen Ext: Trace BLE edema  Rosario JAYSON Kidney, MD 9:32 AM  I spent 45 minutes of time, including in depth chart review, independent review of results as outlined above, communicating results with the  patient directly, face-to-face time with the patient, coordinating care, ordering studies and medications as appropriate, and documentation.  "

## 2024-11-25 NOTE — Telephone Encounter (Signed)
 SABRA

## 2024-11-25 NOTE — Patient Instructions (Signed)
 You have been scheduled for a colonoscopy. Please follow written instructions given to you at your visit today.   If you use inhalers (even only as needed), please bring them with you on the day of your procedure.  DO NOT TAKE 7 DAYS PRIOR TO TEST- Trulicity (dulaglutide) Ozempic , Wegovy  (semaglutide ) Mounjaro, Zepbound (tirzepatide) Bydureon Bcise (exanatide extended release)  DO NOT TAKE 1 DAY PRIOR TO YOUR TEST Rybelsus  (semaglutide ) Adlyxin (lixisenatide) Victoza (liraglutide) Byetta (exanatide) ___________________________________________________________________________  Rosine will be contacted by our office prior to your procedure for directions on holding your ELIQUIS .  If you do not hear from our office 1 week prior to your scheduled procedure, please call [phone] to discuss.     You have been scheduled for an MRI of the Pelvis at [facility], 1st floor radiology.  You appointment is on Friday. 12-03-24 at 9:00AM. Please arrive to admitting (at main entrance of the hospital) 30 minutes prior to your appointment time for registration purposes.  In addition, if you have any metal in your body, have a pacemaker or defibrillator, please be sure to let your ordering physician know. This test typically takes 45 minutes to 1 hour to complete. Should you need to reschedule, please call [phone] to do so.   Increase Miralax  to twice a day.  Increase hydration  Thank you for entrusting me with your care and for choosing [facility], Dr. Estefana Kidney   _______________________________________________________  If your blood pressure at your visit was 140/90 or greater, please contact your primary care physician to follow up on this.  _______________________________________________________  If you are age 67 or older, your body mass index should be between 23-30. Your Body mass index is 32.73 kg/m. If this is out of the aforementioned range listed, please consider  follow up with your Primary Care Provider.  If you are age 27 or younger, your body mass index should be between 19-25. Your Body mass index is 32.73 kg/m. If this is out of the aformentioned range listed, please consider follow up with your Primary Care Provider.   ________________________________________________________  The [facility] providers would like to encourage you to use MYCHART to communicate with providers for non-urgent requests or questions.  Due to long hold times on the telephone, sending your provider a message by [facility] may be a faster and more efficient way to get a response.  Please allow 48 business hours for a response.  Please remember that this is for non-urgent requests.  _______________________________________________________  [facility] is using a team-based approach to care.  Your team is made up of your doctor and two to three APPS. Our APPS (Nurse Practitioners and Physician Assistants) work with your physician to ensure care continuity for you. They are fully qualified to address your health concerns and develop a treatment plan. They communicate directly with your gastroenterologist to care for you. Seeing the Advanced Practice Practitioners on your physician's team can help you by facilitating care more promptly, often allowing for earlier appointments, access to diagnostic testing, procedures, and other specialty referrals.    Due to recent changes in healthcare laws, you may see the results of your imaging and laboratory studies on MyChart before your provider has had a chance to review them.  We understand that in some cases there may be results that are confusing or concerning to you. Not all laboratory results come back in the same time frame and the provider may be waiting for multiple results in order to interpret others.  Please give us  48 hours in order for your provider to thoroughly review all the results before contacting the office for  clarification of your results.

## 2024-11-25 NOTE — Telephone Encounter (Signed)
 [facility] Pre-operative Risk Assessment     Request for surgical clearance:     Endoscopy Procedure  What type of surgery is being performed?     COLONOSCOPY  When is this surgery scheduled?     12-06-24  What type of clearance is required ?   Pharmacy  Are there any medications that need to be held prior to surgery and how long? ELIQUIS  2 DAY  Practice name and name of physician performing surgery?    DR ROSARIO JAYSON FEDERICO [facility]  What is your office phone and fax number?      Phone- [phone]  Fax- [phone]  Anesthesia type (None, local, MAC, general) ?   MAC   Please route your response to Jomaira Darr, CMA

## 2024-11-26 NOTE — Telephone Encounter (Signed)
 Patient would like a call back based on Blood thinner. Please Advise, Thank you.

## 2024-11-26 NOTE — Telephone Encounter (Signed)
Informed patient she can hold her Eliquis 2 days prior to her procedure. Patient verbalized understanding.

## 2024-12-03 ENCOUNTER — Ambulatory Visit (HOSPITAL_COMMUNITY)

## 2024-12-06 ENCOUNTER — Encounter: Admitting: Internal Medicine

## 2025-01-03 ENCOUNTER — Ambulatory Visit
